# Patient Record
Sex: Female | Born: 1940 | Race: White | Hispanic: No | Marital: Married | Smoking: Never smoker
Health system: Southern US, Community
[De-identification: ages and names within clinical notes are randomized; demographics above are authoritative.]

## PROBLEM LIST (undated history)

## (undated) DIAGNOSIS — T4145XA Adverse effect of unspecified anesthetic, initial encounter: Secondary | ICD-10-CM

## (undated) DIAGNOSIS — E119 Type 2 diabetes mellitus without complications: Secondary | ICD-10-CM

## (undated) DIAGNOSIS — M5126 Other intervertebral disc displacement, lumbar region: Secondary | ICD-10-CM

## (undated) DIAGNOSIS — G4730 Sleep apnea, unspecified: Secondary | ICD-10-CM

## (undated) DIAGNOSIS — Z9289 Personal history of other medical treatment: Secondary | ICD-10-CM

## (undated) DIAGNOSIS — C50911 Malignant neoplasm of unspecified site of right female breast: Secondary | ICD-10-CM

## (undated) DIAGNOSIS — E7800 Pure hypercholesterolemia, unspecified: Secondary | ICD-10-CM

## (undated) DIAGNOSIS — I4892 Unspecified atrial flutter: Secondary | ICD-10-CM

## (undated) DIAGNOSIS — R5381 Other malaise: Secondary | ICD-10-CM

## (undated) DIAGNOSIS — R0600 Dyspnea, unspecified: Secondary | ICD-10-CM

## (undated) DIAGNOSIS — R0689 Other abnormalities of breathing: Secondary | ICD-10-CM

## (undated) DIAGNOSIS — I5043 Acute on chronic combined systolic (congestive) and diastolic (congestive) heart failure: Secondary | ICD-10-CM

## (undated) DIAGNOSIS — I428 Other cardiomyopathies: Secondary | ICD-10-CM

## (undated) DIAGNOSIS — M5136 Other intervertebral disc degeneration, lumbar region: Secondary | ICD-10-CM

## (undated) DIAGNOSIS — R5383 Other fatigue: Secondary | ICD-10-CM

## (undated) DIAGNOSIS — IMO0001 Reserved for inherently not codable concepts without codable children: Secondary | ICD-10-CM

## (undated) DIAGNOSIS — Z85828 Personal history of other malignant neoplasm of skin: Secondary | ICD-10-CM

## (undated) DIAGNOSIS — J45909 Unspecified asthma, uncomplicated: Secondary | ICD-10-CM

## (undated) DIAGNOSIS — M7989 Other specified soft tissue disorders: Secondary | ICD-10-CM

## (undated) DIAGNOSIS — I4819 Other persistent atrial fibrillation: Secondary | ICD-10-CM

## (undated) DIAGNOSIS — Z9889 Other specified postprocedural states: Secondary | ICD-10-CM

## (undated) DIAGNOSIS — M1990 Unspecified osteoarthritis, unspecified site: Secondary | ICD-10-CM

## (undated) DIAGNOSIS — R351 Nocturia: Secondary | ICD-10-CM

## (undated) DIAGNOSIS — J189 Pneumonia, unspecified organism: Secondary | ICD-10-CM

## (undated) DIAGNOSIS — I517 Cardiomegaly: Secondary | ICD-10-CM

## (undated) DIAGNOSIS — M51369 Other intervertebral disc degeneration, lumbar region without mention of lumbar back pain or lower extremity pain: Secondary | ICD-10-CM

## (undated) DIAGNOSIS — C4490 Unspecified malignant neoplasm of skin, unspecified: Secondary | ICD-10-CM

## (undated) DIAGNOSIS — I10 Essential (primary) hypertension: Secondary | ICD-10-CM

## (undated) DIAGNOSIS — N189 Chronic kidney disease, unspecified: Secondary | ICD-10-CM

## (undated) DIAGNOSIS — E039 Hypothyroidism, unspecified: Secondary | ICD-10-CM

## (undated) DIAGNOSIS — T8859XA Other complications of anesthesia, initial encounter: Secondary | ICD-10-CM

## (undated) DIAGNOSIS — C8590 Non-Hodgkin lymphoma, unspecified, unspecified site: Secondary | ICD-10-CM

## (undated) DIAGNOSIS — I480 Paroxysmal atrial fibrillation: Secondary | ICD-10-CM

## (undated) DIAGNOSIS — I440 Atrioventricular block, first degree: Secondary | ICD-10-CM

## (undated) HISTORY — DX: Other persistent atrial fibrillation: I48.19

## (undated) HISTORY — DX: Pure hypercholesterolemia, unspecified: E78.00

## (undated) HISTORY — DX: Sleep apnea, unspecified: G47.30

## (undated) HISTORY — DX: Paroxysmal atrial fibrillation: I48.0

## (undated) HISTORY — DX: Hypothyroidism, unspecified: E03.9

## (undated) HISTORY — DX: Cardiomegaly: I51.7

## (undated) HISTORY — DX: Other specified soft tissue disorders: M79.89

## (undated) HISTORY — DX: Other cardiomyopathies: I42.8

## (undated) HISTORY — PX: TONSILLECTOMY: SUR1361

## (undated) HISTORY — DX: Unspecified atrial flutter: I48.92

## (undated) HISTORY — PX: CATARACT EXTRACTION W/ INTRAOCULAR LENS  IMPLANT, BILATERAL: SHX1307

## (undated) HISTORY — PX: FOOT SURGERY: SHX648

## (undated) HISTORY — DX: Acute on chronic combined systolic (congestive) and diastolic (congestive) heart failure: I50.43

## (undated) HISTORY — DX: Other intervertebral disc degeneration, lumbar region: M51.36

## (undated) HISTORY — PX: BACK SURGERY: SHX140

## (undated) HISTORY — PX: PUBOVAGINAL SLING: SHX1035

## (undated) HISTORY — DX: Unspecified osteoarthritis, unspecified site: M19.90

## (undated) HISTORY — DX: Other malaise: R53.81

## (undated) HISTORY — DX: Atrioventricular block, first degree: I44.0

## (undated) HISTORY — PX: LUMBAR LAMINECTOMY: SHX95

## (undated) HISTORY — PX: ABDOMINAL HYSTERECTOMY: SHX81

## (undated) HISTORY — DX: Other fatigue: R53.83

## (undated) HISTORY — DX: Non-Hodgkin lymphoma, unspecified, unspecified site: C85.90

## (undated) HISTORY — PX: BREAST SURGERY: SHX581

## (undated) HISTORY — DX: Other specified postprocedural states: Z98.890

## (undated) HISTORY — DX: Other abnormalities of breathing: R06.00

## (undated) HISTORY — DX: Other intervertebral disc displacement, lumbar region: M51.26

## (undated) HISTORY — DX: Other abnormalities of breathing: R06.89

## (undated) HISTORY — DX: Other intervertebral disc degeneration, lumbar region without mention of lumbar back pain or lower extremity pain: M51.369

---
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
## 1970-03-01 HISTORY — PX: ABDOMINAL HYSTERECTOMY: SHX81

## 1991-03-02 HISTORY — PX: CHOLECYSTECTOMY: SHX55

## 1998-02-04 ENCOUNTER — Ambulatory Visit (HOSPITAL_COMMUNITY): Admission: RE | Admit: 1998-02-04 | Discharge: 1998-02-04 | Payer: Self-pay | Admitting: Obstetrics and Gynecology

## 1998-02-04 ENCOUNTER — Encounter: Payer: Self-pay | Admitting: Obstetrics and Gynecology

## 1999-02-17 ENCOUNTER — Encounter: Payer: Self-pay | Admitting: Obstetrics and Gynecology

## 1999-02-17 ENCOUNTER — Ambulatory Visit (HOSPITAL_COMMUNITY): Admission: RE | Admit: 1999-02-17 | Discharge: 1999-02-17 | Payer: Self-pay | Admitting: Obstetrics and Gynecology

## 2000-03-15 ENCOUNTER — Ambulatory Visit (HOSPITAL_COMMUNITY): Admission: RE | Admit: 2000-03-15 | Discharge: 2000-03-15 | Payer: Self-pay | Admitting: Obstetrics and Gynecology

## 2000-03-15 ENCOUNTER — Encounter: Payer: Self-pay | Admitting: Obstetrics and Gynecology

## 2000-12-20 ENCOUNTER — Encounter: Payer: Self-pay | Admitting: Obstetrics and Gynecology

## 2000-12-20 ENCOUNTER — Encounter: Admission: RE | Admit: 2000-12-20 | Discharge: 2000-12-20 | Payer: Self-pay | Admitting: Obstetrics and Gynecology

## 2001-02-20 ENCOUNTER — Other Ambulatory Visit: Admission: RE | Admit: 2001-02-20 | Discharge: 2001-02-20 | Payer: Self-pay | Admitting: Obstetrics and Gynecology

## 2001-06-29 ENCOUNTER — Ambulatory Visit (HOSPITAL_COMMUNITY): Admission: RE | Admit: 2001-06-29 | Discharge: 2001-06-29 | Payer: Self-pay | Admitting: Internal Medicine

## 2001-06-29 ENCOUNTER — Encounter: Payer: Self-pay | Admitting: Internal Medicine

## 2001-12-06 ENCOUNTER — Ambulatory Visit (HOSPITAL_COMMUNITY): Admission: RE | Admit: 2001-12-06 | Discharge: 2001-12-06 | Payer: Self-pay | Admitting: *Deleted

## 2002-09-18 ENCOUNTER — Encounter: Payer: Self-pay | Admitting: Internal Medicine

## 2002-09-18 ENCOUNTER — Ambulatory Visit (HOSPITAL_COMMUNITY): Admission: RE | Admit: 2002-09-18 | Discharge: 2002-09-18 | Payer: Self-pay | Admitting: Internal Medicine

## 2003-11-06 ENCOUNTER — Ambulatory Visit (HOSPITAL_COMMUNITY): Admission: RE | Admit: 2003-11-06 | Discharge: 2003-11-06 | Payer: Self-pay | Admitting: Internal Medicine

## 2004-07-07 ENCOUNTER — Other Ambulatory Visit: Admission: RE | Admit: 2004-07-07 | Discharge: 2004-07-07 | Payer: Self-pay | Admitting: Obstetrics and Gynecology

## 2004-11-30 ENCOUNTER — Ambulatory Visit (HOSPITAL_COMMUNITY): Admission: RE | Admit: 2004-11-30 | Discharge: 2004-11-30 | Payer: Self-pay | Admitting: Internal Medicine

## 2005-12-01 ENCOUNTER — Ambulatory Visit (HOSPITAL_COMMUNITY): Admission: RE | Admit: 2005-12-01 | Discharge: 2005-12-01 | Payer: Self-pay | Admitting: Obstetrics and Gynecology

## 2006-07-18 ENCOUNTER — Encounter: Admission: RE | Admit: 2006-07-18 | Discharge: 2006-07-18 | Payer: Self-pay | Admitting: *Deleted

## 2006-08-24 ENCOUNTER — Other Ambulatory Visit: Admission: RE | Admit: 2006-08-24 | Discharge: 2006-08-24 | Payer: Self-pay | Admitting: Obstetrics and Gynecology

## 2006-12-21 ENCOUNTER — Ambulatory Visit (HOSPITAL_COMMUNITY): Admission: RE | Admit: 2006-12-21 | Discharge: 2006-12-21 | Payer: Self-pay | Admitting: Obstetrics and Gynecology

## 2007-02-08 ENCOUNTER — Encounter (INDEPENDENT_AMBULATORY_CARE_PROVIDER_SITE_OTHER): Payer: Self-pay | Admitting: *Deleted

## 2007-02-08 ENCOUNTER — Ambulatory Visit (HOSPITAL_COMMUNITY): Admission: RE | Admit: 2007-02-08 | Discharge: 2007-02-08 | Payer: Self-pay | Admitting: *Deleted

## 2007-11-28 ENCOUNTER — Ambulatory Visit: Payer: Self-pay | Admitting: Obstetrics and Gynecology

## 2007-12-25 ENCOUNTER — Ambulatory Visit (HOSPITAL_COMMUNITY): Admission: RE | Admit: 2007-12-25 | Discharge: 2007-12-25 | Payer: Self-pay | Admitting: Internal Medicine

## 2008-03-04 ENCOUNTER — Encounter: Admission: RE | Admit: 2008-03-04 | Discharge: 2008-06-02 | Payer: Self-pay | Admitting: Rheumatology

## 2008-10-10 ENCOUNTER — Encounter: Payer: Self-pay | Admitting: Cardiology

## 2008-10-10 ENCOUNTER — Observation Stay (HOSPITAL_COMMUNITY): Admission: EM | Admit: 2008-10-10 | Discharge: 2008-10-11 | Payer: Self-pay | Admitting: Emergency Medicine

## 2008-10-10 ENCOUNTER — Ambulatory Visit: Payer: Self-pay | Admitting: Cardiology

## 2008-10-10 HISTORY — PX: CARDIAC CATHETERIZATION: SHX172

## 2008-12-03 ENCOUNTER — Other Ambulatory Visit: Admission: RE | Admit: 2008-12-03 | Discharge: 2008-12-03 | Payer: Self-pay | Admitting: Obstetrics and Gynecology

## 2008-12-03 ENCOUNTER — Encounter: Payer: Self-pay | Admitting: Obstetrics and Gynecology

## 2008-12-03 ENCOUNTER — Ambulatory Visit: Payer: Self-pay | Admitting: Obstetrics and Gynecology

## 2008-12-16 ENCOUNTER — Ambulatory Visit: Payer: Self-pay | Admitting: Obstetrics and Gynecology

## 2009-01-02 ENCOUNTER — Ambulatory Visit (HOSPITAL_COMMUNITY): Admission: RE | Admit: 2009-01-02 | Discharge: 2009-01-02 | Payer: Self-pay | Admitting: Obstetrics and Gynecology

## 2009-02-17 ENCOUNTER — Encounter (INDEPENDENT_AMBULATORY_CARE_PROVIDER_SITE_OTHER): Payer: Self-pay | Admitting: *Deleted

## 2009-02-26 ENCOUNTER — Encounter (INDEPENDENT_AMBULATORY_CARE_PROVIDER_SITE_OTHER): Payer: Self-pay | Admitting: *Deleted

## 2009-03-03 ENCOUNTER — Ambulatory Visit: Payer: Self-pay | Admitting: Internal Medicine

## 2009-03-13 ENCOUNTER — Ambulatory Visit: Payer: Self-pay | Admitting: Internal Medicine

## 2009-03-18 ENCOUNTER — Encounter: Payer: Self-pay | Admitting: Internal Medicine

## 2009-10-08 ENCOUNTER — Ambulatory Visit: Payer: Self-pay | Admitting: Cardiology

## 2009-10-20 ENCOUNTER — Ambulatory Visit (HOSPITAL_COMMUNITY): Admission: RE | Admit: 2009-10-20 | Discharge: 2009-10-20 | Payer: Self-pay | Admitting: Cardiology

## 2009-10-20 ENCOUNTER — Ambulatory Visit: Payer: Self-pay | Admitting: Cardiology

## 2009-12-05 ENCOUNTER — Ambulatory Visit: Payer: Self-pay | Admitting: Obstetrics and Gynecology

## 2009-12-17 ENCOUNTER — Ambulatory Visit: Payer: Self-pay | Admitting: Obstetrics and Gynecology

## 2010-01-05 ENCOUNTER — Encounter: Admission: RE | Admit: 2010-01-05 | Discharge: 2010-01-05 | Payer: Self-pay | Admitting: Obstetrics and Gynecology

## 2010-03-01 HISTORY — PX: SKIN TAG REMOVAL: SHX780

## 2010-03-21 ENCOUNTER — Encounter: Payer: Self-pay | Admitting: Internal Medicine

## 2010-03-31 NOTE — Procedures (Signed)
Summary: Colonoscopy  Patient: [name] Note: All result statuses are Final unless otherwise noted.  Tests: (1) Colonoscopy (COL)   COL Colonoscopy           DONE     [facility]     [address]     [place], [place]  [address]           COLONOSCOPY PROCEDURE REPORT           PATIENT:  Patty [name], Patty [name]  MR#:  [id number]     BIRTHDATE:  1940-09-19, 68 yrs. old  GENDER:  female           ENDOSCOPIST:  Iva Boop, MD, [facility]     Referred by:  Soyla Murphy. Renne Crigler, M.D.           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  Digital rectal exam was performed and     revealed decreased sphincter tone.   The LB CF-H180AL [id number]     endoscope was introduced through the anus and advanced to the     cecum, which was identified by both the appendix and ileocecal     valve. This required movement of the patient to supine position     and then left larteral again, as well as upper abdominal pressure.     The quality of the prep was adequate (after irrigation and     suctioning), using MiraLax.  The instrument was then slowly     withdrawn as the colon was fully examined. Insertion: 12:00     minutes, Withdrawal: 18:50 minutes     <<PROCEDUREIMAGES>>           FINDINGS:  A diminutive polyp was found in the mid transverse     colon. It was 3 mm in size. The polyp was removed using cold  biopsy forceps.  Moderate diverticulosis was found in the left     colon.  This was otherwise a normal examination of the colon.     Retroflexed views in the rectum revealed no abnormalities.    The     scope was then withdrawn from the patient and the procedure     completed.           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 3 vs. 5     years likely (considering health atstus in futire also)           Iva Boop, MD, [facility]           CC:  Romero Liner, MD     The Patient           n.     eSIGNED:   Iva Boop at 03/13/2009 10:06 AM           Patty [name], [id number]  Note: An exclamation mark (!) indicates a result that was not dispersed into the flowsheet. Document Creation Date: 03/13/2009 10:06 AM _______________________________________________________________________  (1) Order result status: Final Collection or observation date-time: 03/13/2009 09:52 Requested date-time:  Receipt date-time:  Reported date-time:  Referring Physician:   Ordering Physician: Stan Head [id number]) Specimen Source:  Source: Launa Grill Order Number: [id number] Lab site:   Appended Document: Colonoscopy     Procedures Next Due Date:    Colonoscopy: 03/2014

## 2010-03-31 NOTE — Miscellaneous (Signed)
Summary: previsit [facility]/rhm  Clinical Lists Changes  Medications: Added new medication of MOVIPREP 100 GM  SOLR (PEG-KCL-NACL-NASULF-NA ASC-C) As per prep instructions. - Signed Rx of MOVIPREP 100 GM  SOLR (PEG-KCL-NACL-NASULF-NA ASC-C) As per prep instructions.;  #1 x 0;  Signed;  Entered by: Sherren Kerns RN;  Authorized by: Iva Boop MD, FACG;  Method used: Print then Give to Patient Allergies: Added new allergy or adverse reaction of PENICILLIN Added new allergy or adverse reaction of DEMEROL Observations: Added new observation of NKA: F (03/03/2009 10:04)    Prescriptions: MOVIPREP 100 GM  SOLR (PEG-KCL-NACL-NASULF-NA ASC-C) As per prep instructions.  #1 x 0   Entered by:   Sherren Kerns RN   Authorized by:   Iva Boop MD, [facility]   Signed by:   Sherren Kerns RN on 03/03/2009   Method used:   Print then Give to Patient   RxID:   [id number]

## 2010-03-31 NOTE — Letter (Signed)
Summary: Patient Notice- Polyp Results  [facility]  [address] [place], [place] [address]   Phone: [phone]  Fax: [phone]        March 18, 2009 MRN: [id number]    Patty [name] [address] [place], [place]  [address]    Dear Ms. [name],  The polyp removed from your colon was adenomatous. This means that it was pre-cancerous or that  it had the potential to change into cancer over time.  I recommend that you have a repeat colonoscopy in 5 years to determine if you have developed any new polyps over time. If you develop any new rectal bleeding, abdominal pain or significant bowel habit changes, please contact us before then.  Please call us if you are having persistent problems or have questions about your condition that have not been fully answered at this time.  Sincerely,  Iva Boop MD, [facility]  This letter has been electronically signed by your physician.  Appended Document: Patient Notice- Polyp Results Letter mailed 1.21.11

## 2010-05-04 ENCOUNTER — Emergency Department (HOSPITAL_COMMUNITY): Payer: Medicare Other

## 2010-05-04 ENCOUNTER — Inpatient Hospital Stay (HOSPITAL_COMMUNITY)
Admission: EM | Admit: 2010-05-04 | Discharge: 2010-05-12 | DRG: 309 | Disposition: A | Discharge status: Home / Self Care | Payer: Medicare Other | Attending: Cardiology | Admitting: Cardiology

## 2010-05-04 DIAGNOSIS — E7800 Pure hypercholesterolemia, unspecified: Secondary | ICD-10-CM | POA: Diagnosis present

## 2010-05-04 DIAGNOSIS — I440 Atrioventricular block, first degree: Secondary | ICD-10-CM | POA: Diagnosis present

## 2010-05-04 DIAGNOSIS — I2510 Atherosclerotic heart disease of native coronary artery without angina pectoris: Secondary | ICD-10-CM | POA: Diagnosis present

## 2010-05-04 DIAGNOSIS — Z882 Allergy status to sulfonamides status: Secondary | ICD-10-CM

## 2010-05-04 DIAGNOSIS — R7309 Other abnormal glucose: Secondary | ICD-10-CM | POA: Diagnosis present

## 2010-05-04 DIAGNOSIS — Z79899 Other long term (current) drug therapy: Secondary | ICD-10-CM

## 2010-05-04 DIAGNOSIS — N390 Urinary tract infection, site not specified: Secondary | ICD-10-CM | POA: Diagnosis present

## 2010-05-04 DIAGNOSIS — R079 Chest pain, unspecified: Secondary | ICD-10-CM

## 2010-05-04 DIAGNOSIS — J45909 Unspecified asthma, uncomplicated: Secondary | ICD-10-CM | POA: Diagnosis present

## 2010-05-04 DIAGNOSIS — Z7901 Long term (current) use of anticoagulants: Secondary | ICD-10-CM

## 2010-05-04 DIAGNOSIS — Z880 Allergy status to penicillin: Secondary | ICD-10-CM

## 2010-05-04 DIAGNOSIS — B961 Klebsiella pneumoniae [K. pneumoniae] as the cause of diseases classified elsewhere: Secondary | ICD-10-CM | POA: Diagnosis present

## 2010-05-04 DIAGNOSIS — I4891 Unspecified atrial fibrillation: Secondary | ICD-10-CM | POA: Diagnosis present

## 2010-05-04 DIAGNOSIS — E039 Hypothyroidism, unspecified: Secondary | ICD-10-CM | POA: Diagnosis present

## 2010-05-04 DIAGNOSIS — I4892 Unspecified atrial flutter: Principal | ICD-10-CM | POA: Diagnosis present

## 2010-05-04 DIAGNOSIS — E876 Hypokalemia: Secondary | ICD-10-CM | POA: Diagnosis present

## 2010-05-04 DIAGNOSIS — I959 Hypotension, unspecified: Secondary | ICD-10-CM | POA: Diagnosis present

## 2010-05-04 DIAGNOSIS — E785 Hyperlipidemia, unspecified: Secondary | ICD-10-CM | POA: Diagnosis present

## 2010-05-04 DIAGNOSIS — I10 Essential (primary) hypertension: Secondary | ICD-10-CM | POA: Diagnosis present

## 2010-05-04 DIAGNOSIS — I4510 Unspecified right bundle-branch block: Secondary | ICD-10-CM | POA: Diagnosis present

## 2010-05-04 DIAGNOSIS — Z7982 Long term (current) use of aspirin: Secondary | ICD-10-CM

## 2010-05-04 DIAGNOSIS — Z91013 Allergy to seafood: Secondary | ICD-10-CM

## 2010-05-04 LAB — COMPREHENSIVE METABOLIC PANEL
CO2: 25 mEq/L (ref 19–32)
Calcium: 9.6 mg/dL (ref 8.4–10.5)
Creatinine, Ser: 0.75 mg/dL (ref 0.4–1.2)
GFR calc Af Amer: >60 mL/min (ref >60)
GFR calc non Af Amer: >60 mL/min (ref >60)
Glucose, Bld: 120 mg/dL — ABNORMAL HIGH (ref 70–99)
Total Protein: 6.8 g/dL (ref 6.0–8.3)

## 2010-05-04 LAB — DIFFERENTIAL
Basophils Absolute: 0 10*3/uL (ref 0.0–0.1)
Basophils Relative: 1 % (ref 0–1)
Eosinophils Absolute: 0.3 10*3/uL (ref 0.0–0.7)
Eosinophils Relative: 3 % (ref 0–5)
Neutrophils Relative %: 56 % (ref 43–77)

## 2010-05-04 LAB — POCT CARDIAC MARKERS
CKMB, poc: <1 ng/mL — ABNORMAL LOW (ref 1.0–8.0)
CKMB, poc: 1.4 ng/mL (ref 1.0–8.0)
Troponin i, poc: <0.05 ng/mL (ref 0.00–0.09)
Troponin i, poc: <0.05 ng/mL (ref 0.00–0.09)

## 2010-05-04 LAB — CBC
HCT: 37.1 % (ref 36.0–46.0)
Hemoglobin: 12.2 g/dL (ref 12.0–15.0)
MCH: 28.5 pg (ref 26.0–34.0)
MCHC: 32.9 g/dL (ref 30.0–36.0)
Platelets: 228 10*3/uL (ref 150–400)
RDW: 13.8 % (ref 11.5–15.5)
RDW: 13.9 % (ref 11.5–15.5)
WBC: 8.1 10*3/uL (ref 4.0–10.5)

## 2010-05-04 LAB — PROTIME-INR
INR: 1 (ref 0.00–1.49)
Prothrombin Time: 13.4 seconds (ref 11.6–15.2)

## 2010-05-04 LAB — HEMOGLOBIN A1C
Hgb A1c MFr Bld: 6.3 % — ABNORMAL HIGH (ref <5.7)
Mean Plasma Glucose: 134 mg/dL — ABNORMAL HIGH (ref <117)

## 2010-05-04 LAB — GLUCOSE, CAPILLARY: Glucose-Capillary: 145 mg/dL — ABNORMAL HIGH (ref 70–99)

## 2010-05-04 LAB — CK TOTAL AND CKMB (NOT AT ARMC): Relative Index: INVALID (ref 0.0–2.5)

## 2010-05-05 DIAGNOSIS — I4891 Unspecified atrial fibrillation: Secondary | ICD-10-CM

## 2010-05-05 LAB — CARDIAC PANEL(CRET KIN+CKTOT+MB+TROPI)
CK, MB: 2.4 ng/mL (ref 0.3–4.0)
Total CK: 66 U/L (ref 7–177)
Troponin I: <0.01 ng/mL (ref 0.00–0.06)
Troponin I: 0.01 ng/mL (ref 0.00–0.06)

## 2010-05-05 LAB — LIPID PANEL
Cholesterol: 128 mg/dL (ref 0–200)
LDL Cholesterol: 51 mg/dL (ref 0–99)
Triglycerides: 163 mg/dL — ABNORMAL HIGH (ref <150)

## 2010-05-05 LAB — GLUCOSE, CAPILLARY
Glucose-Capillary: 115 mg/dL — ABNORMAL HIGH (ref 70–99)
Glucose-Capillary: 103 mg/dL — ABNORMAL HIGH (ref 70–99)
Glucose-Capillary: 121 mg/dL — ABNORMAL HIGH (ref 70–99)

## 2010-05-06 ENCOUNTER — Inpatient Hospital Stay (HOSPITAL_COMMUNITY): Payer: Medicare Other

## 2010-05-06 DIAGNOSIS — R072 Precordial pain: Secondary | ICD-10-CM

## 2010-05-06 DIAGNOSIS — I517 Cardiomegaly: Secondary | ICD-10-CM

## 2010-05-06 LAB — CBC
HCT: 35.7 % — ABNORMAL LOW (ref 36.0–46.0)
Hemoglobin: 11.7 g/dL — ABNORMAL LOW (ref 12.0–15.0)
MCH: 28.5 pg (ref 26.0–34.0)
MCV: 86.9 fL (ref 78.0–100.0)
Platelets: 223 10*3/uL (ref 150–400)
RBC: 4.11 MIL/uL (ref 3.87–5.11)
WBC: 8 10*3/uL (ref 4.0–10.5)

## 2010-05-06 LAB — DIFFERENTIAL
Lymphocytes Relative: 31 % (ref 12–46)
Lymphs Abs: 2.5 10*3/uL (ref 0.7–4.0)
Monocytes Relative: 8 % (ref 3–12)
Neutrophils Relative %: 58 % (ref 43–77)

## 2010-05-06 LAB — GLUCOSE, CAPILLARY
Glucose-Capillary: 122 mg/dL — ABNORMAL HIGH (ref 70–99)
Glucose-Capillary: 122 mg/dL — ABNORMAL HIGH (ref 70–99)
Glucose-Capillary: 171 mg/dL — ABNORMAL HIGH (ref 70–99)

## 2010-05-06 MED ORDER — TECHNETIUM TC 99M TETROFOSMIN IV KIT
30.0000 | PACK | Freq: Once | INTRAVENOUS | Status: AC | PRN
  Administered 2010-05-06: 11:00:00 30 via INTRAVENOUS

## 2010-05-06 MED ORDER — TECHNETIUM TC 99M TETROFOSMIN IV KIT
10.0000 | PACK | Freq: Once | INTRAVENOUS | Status: AC | PRN
  Administered 2010-05-06: 09:00:00 10 via INTRAVENOUS

## 2010-05-07 ENCOUNTER — Other Ambulatory Visit (HOSPITAL_COMMUNITY): Payer: No Typology Code available for payment source

## 2010-05-07 LAB — PROTIME-INR: Prothrombin Time: 14.1 seconds (ref 11.6–15.2)

## 2010-05-07 LAB — GLUCOSE, CAPILLARY: Glucose-Capillary: 134 mg/dL — ABNORMAL HIGH (ref 70–99)

## 2010-05-08 LAB — CBC
MCH: 28.9 pg (ref 26.0–34.0)
MCHC: 33.8 g/dL (ref 30.0–36.0)
Platelets: 217 10*3/uL (ref 150–400)
RBC: 3.95 MIL/uL (ref 3.87–5.11)

## 2010-05-08 LAB — GLUCOSE, CAPILLARY: Glucose-Capillary: 149 mg/dL — ABNORMAL HIGH (ref 70–99)

## 2010-05-08 LAB — URINALYSIS, MICROSCOPIC ONLY
Ketones, ur: NEGATIVE mg/dL
Nitrite: NEGATIVE
Protein, ur: NEGATIVE mg/dL
pH: 6.5 (ref 5.0–8.0)

## 2010-05-08 LAB — BASIC METABOLIC PANEL
BUN: 14 mg/dL (ref 6–23)
GFR calc Af Amer: >60 mL/min (ref >60)
GFR calc non Af Amer: >60 mL/min (ref >60)
Potassium: 2.8 mEq/L — ABNORMAL LOW (ref 3.5–5.1)

## 2010-05-08 LAB — PROTIME-INR
INR: 1.28 (ref 0.00–1.49)
Prothrombin Time: 16.2 seconds — ABNORMAL HIGH (ref 11.6–15.2)

## 2010-05-09 ENCOUNTER — Inpatient Hospital Stay (HOSPITAL_COMMUNITY): Payer: Medicare Other

## 2010-05-09 DIAGNOSIS — I4892 Unspecified atrial flutter: Secondary | ICD-10-CM

## 2010-05-09 LAB — INFLUENZA PANEL BY PCR (TYPE A & B): Influenza A By PCR: NEGATIVE

## 2010-05-09 LAB — BASIC METABOLIC PANEL
BUN: 16 mg/dL (ref 6–23)
Calcium: 9.1 mg/dL (ref 8.4–10.5)
Creatinine, Ser: 0.94 mg/dL (ref 0.4–1.2)
GFR calc non Af Amer: 59 mL/min — ABNORMAL LOW (ref >60)

## 2010-05-09 LAB — PROTIME-INR
INR: 1.66 — ABNORMAL HIGH (ref 0.00–1.49)
Prothrombin Time: 19.8 seconds — ABNORMAL HIGH (ref 11.6–15.2)

## 2010-05-09 LAB — GLUCOSE, CAPILLARY
Glucose-Capillary: 123 mg/dL — ABNORMAL HIGH (ref 70–99)
Glucose-Capillary: 122 mg/dL — ABNORMAL HIGH (ref 70–99)

## 2010-05-09 LAB — URINALYSIS, DIPSTICK ONLY
Glucose, UA: NEGATIVE mg/dL
Ketones, ur: NEGATIVE mg/dL
Protein, ur: NEGATIVE mg/dL

## 2010-05-10 DIAGNOSIS — R509 Fever, unspecified: Secondary | ICD-10-CM

## 2010-05-10 LAB — URINE CULTURE
Colony Count: >=100000
Culture  Setup Time: 201203091009
Special Requests: NEGATIVE

## 2010-05-10 LAB — GLUCOSE, CAPILLARY
Glucose-Capillary: 121 mg/dL — ABNORMAL HIGH (ref 70–99)
Glucose-Capillary: 117 mg/dL — ABNORMAL HIGH (ref 70–99)
Glucose-Capillary: 129 mg/dL — ABNORMAL HIGH (ref 70–99)

## 2010-05-10 LAB — CBC
HCT: 29.8 % — ABNORMAL LOW (ref 36.0–46.0)
MCHC: 33.2 g/dL (ref 30.0–36.0)
MCV: 86.4 fL (ref 78.0–100.0)
RDW: 14.1 % (ref 11.5–15.5)

## 2010-05-10 LAB — COMPREHENSIVE METABOLIC PANEL
BUN: 24 mg/dL — ABNORMAL HIGH (ref 6–23)
CO2: 26 mEq/L (ref 19–32)
Chloride: 98 mEq/L (ref 96–112)
Creatinine, Ser: 1.19 mg/dL (ref 0.4–1.2)
GFR calc non Af Amer: 45 mL/min — ABNORMAL LOW (ref >60)
Glucose, Bld: 142 mg/dL — ABNORMAL HIGH (ref 70–99)
Total Bilirubin: 1 mg/dL (ref 0.3–1.2)

## 2010-05-10 LAB — PROTIME-INR: INR: 2.45 — ABNORMAL HIGH (ref 0.00–1.49)

## 2010-05-11 DIAGNOSIS — I4891 Unspecified atrial fibrillation: Secondary | ICD-10-CM

## 2010-05-11 LAB — GLUCOSE, CAPILLARY
Glucose-Capillary: 116 mg/dL — ABNORMAL HIGH (ref 70–99)
Glucose-Capillary: 127 mg/dL — ABNORMAL HIGH (ref 70–99)

## 2010-05-11 LAB — BASIC METABOLIC PANEL
BUN: 27 mg/dL — ABNORMAL HIGH (ref 6–23)
CO2: 26 mEq/L (ref 19–32)
Calcium: 8.8 mg/dL (ref 8.4–10.5)
Creatinine, Ser: 1.03 mg/dL (ref 0.4–1.2)
GFR calc Af Amer: >60 mL/min (ref >60)

## 2010-05-11 LAB — CBC
MCHC: 33.3 g/dL (ref 30.0–36.0)
MCV: 84.9 fL (ref 78.0–100.0)
Platelets: 223 10*3/uL (ref 150–400)
RDW: 13.8 % (ref 11.5–15.5)
WBC: 8.5 10*3/uL (ref 4.0–10.5)

## 2010-05-11 LAB — URINE CULTURE: Colony Count: >=100000

## 2010-05-12 LAB — COMPREHENSIVE METABOLIC PANEL
AST: 45 U/L — ABNORMAL HIGH (ref 0–37)
Albumin: 2.9 g/dL — ABNORMAL LOW (ref 3.5–5.2)
Calcium: 8.7 mg/dL (ref 8.4–10.5)
Creatinine, Ser: 0.71 mg/dL (ref 0.4–1.2)
GFR calc Af Amer: >60 mL/min (ref >60)

## 2010-05-12 LAB — PROTIME-INR
INR: 2.11 — ABNORMAL HIGH (ref 0.00–1.49)
Prothrombin Time: 23.8 seconds — ABNORMAL HIGH (ref 11.6–15.2)

## 2010-05-15 ENCOUNTER — Ambulatory Visit (INDEPENDENT_AMBULATORY_CARE_PROVIDER_SITE_OTHER): Payer: Medicare Other | Admitting: Nurse Practitioner

## 2010-05-15 DIAGNOSIS — I4891 Unspecified atrial fibrillation: Secondary | ICD-10-CM

## 2010-05-15 DIAGNOSIS — Z7901 Long term (current) use of anticoagulants: Secondary | ICD-10-CM

## 2010-05-15 LAB — CULTURE, BLOOD (ROUTINE X 2): Culture  Setup Time: 201203102025

## 2010-05-20 ENCOUNTER — Ambulatory Visit (INDEPENDENT_AMBULATORY_CARE_PROVIDER_SITE_OTHER): Payer: Medicare Other | Admitting: *Deleted

## 2010-05-20 DIAGNOSIS — I4891 Unspecified atrial fibrillation: Secondary | ICD-10-CM

## 2010-05-20 DIAGNOSIS — Z7901 Long term (current) use of anticoagulants: Secondary | ICD-10-CM

## 2010-05-20 LAB — POCT INR: INR: 1.5

## 2010-05-25 NOTE — Discharge Summary (Signed)
Patty [name], Patty [name]:  [id number]  MEDICAL RECORD NO.:  [id number]           PATIENT TYPE:  I  LOCATION:  2032                         FACILITY:  [facility]  PHYSICIAN:  Cassell Clement, M.D. DATE OF BIRTH:  January 16, 1941  DATE OF ADMISSION:  05/04/2010 DATE OF DISCHARGE:  05/12/2010                              DISCHARGE SUMMARY   PRIMARY CARDIOLOGIST:  Colleen Can. Deborah Chalk, MD  PRIMARY CARE PROVIDER:  Soyla Murphy. Renne Crigler, MD  DISCHARGE DIAGNOSES: 1. Paroxysmal atrial flutter/fibrillation, rate controlled on     diltiazem and digoxin.  Chronic anticoagulation initiated this     admission. 2. Increased LFTs felt to be secondary to Multaq use, this has been     discontinued on this admission. 3. Klebsiella urinary tract infection, on Ceftin. 4. Borderline hypotension. 5. Hypothyroidism. 6. Borderline hypokalemia.  SECONDARY DIAGNOSES: 1. Nonobstructive coronary artery disease per cardiac catheterization     in August 2010.     a.     20-30% lesion in the left anterior descending diagonal and      circumflex.  Ejection fraction 65%.     b.     Myoview on May 07, 2010:  Study showed no sign of scar or      ischemia.  Normal nuclear stress study.  Ejection fraction 70%. 2. Hypertension. 3. Hyperlipidemia. 4. Borderline diabetes. 5. Asthma.  ALLERGIES: 1. DEMEROL causing vomiting. 2. PENICILLIN causing rash. 3. SULFA causing rash. 4. SHELLFISH, specifically OYSTERS.  PROCEDURES/DIAGNOSTICS PERFORMED DURING HOSPITALIZATION: 1. Lexiscan Myoview, May 07, 2010:  No sign of scar or ischemia.     Normal nuclear stress.  Ejection fraction 70%.  (Of note, the     patient initially started as an exercise Myoview but secondary to     inability to achieve a target heart rate, she was changed to     Snyder). 2. Echocardiogram on May 06, 2010:  Left ventricle with moderate LVH.     Estimated ejection fraction 65%.  No wall motion abnormalities.     Left atrium  mildly dilated.  Right ventricle mildly dilated with     mildly reduced systolic function.  Right atrium mildly dilated.     Pulmonary artery peak pressure 40 mmHg. 3. Chest x-ray on May 04, 2010, demonstrating no infiltrates,     congestive heart failure or pneumothorax.  There was noted to be a     calcified aorta. 4. Followup chest x-ray on May 09, 2010, showed no evidence of acute     cardiopulmonary disease.  There was a mild chronic peribronchial     thickening noted.  REASON FOR HOSPITALIZATION:  This is a 70 year old female with the above- stated problem list who presented to the [facility] Emergency Department via EMS with complaints of acute onset of substernal chest pain described that as 10/10.  During this time se also fell in a regular heart rate. By arrival to [facility], the patient's heart rate was noted to be in the 70s, EKG was with a new right bundle-branch block.  Her chest pain had relieved at rest.  The patient was admitted for further evaluation.  HOSPITAL COURSE:  The patient was admitted to telemetry and placed on heparin and nitrates.  She did have an episode of atrial fibrillation on night of admission with rates up to 100.  At this time the patient states she is symptomatic and felt more fatigued than usual.  She had no chest pain.  She was changed to full-dose Lovenox and diltiazem 30 mg q.6 h. was added.  Throughout the evening, the patient was monitored on telemetry and noted to show intermittent atrial flutter/fibrillation. Again, the patient was mildly symptomatic with this patient running in and out of atrial fib/flutter, it was noted that her CHADS-VASc score was 3 and therefore she was initiated on Coumadin.  The patient was not interested in Pradaxa after her sister had been on Pradaxa at one time and had a bad experience.  A 2-D echo was obtained to assess for left atrial and left ventricular ejection fraction.  Left ventricular function was normal,  ejection fraction is 65%.  Left atrium was mildly dilated.  She was continued on diltiazem at this time and exercise Myoview was scheduled for the following morning.  The patient was unable to achieve target heart rate during stress Myoview.  Therefore, the patient was changed to a [employer].  Result showed no evidence of ischemia or infarct.  The patient remained with increased shortness of breath post Myoview.  The patient then returned to normal sinus rhythm with a first-degree AV block per EKG, her diltiazem was consolidated to 120 mg daily.  The patient continued in and out of atrial fibrillation with rates up to 130, therefore Multaq was initiated with the normal ejection fraction.  The patient initially tolerated this well and was voiding discharge after therapeutic INR.  It was then noted that the patient's LFTs were elevated with an AST of 352 and then ALT of 246, this was felt to be secondary to her Multaq and therefore this was discontinued.  An electrophysio consult was obtained, Dr. Johney Frame evaluated the patient and noted that he was unclear how symptomatic the patient was and therefore was initially suggest rate control along with Coumadin.  Digoxin was added to her Cardizem dose.  The patient remained in atrial flutter/fibrillation but was rate controlled without symptoms. With the discontinuation of Multaq, the patient's LFTs were beginning to normalize with an AST of 45 and an ALT of 120.  This will be rechecked in several days as an outpatient to ensure that they continue to improve.  Of note, on March 11 the patient was noted to be mildly febrile.  She also had complaints of urinary incontinence that reminded her of UTI symptoms.  She also showed leukocytosis, therefore urinalysis and urine culture were obtained.  The patient was placed on Macrobid.  Urine cultures did show Klebsiella, the patient states that Macrobid did not work in the past, therefore Ceftin  was initiated.  She will continue antibiotic dose as an outpatient.  The patient's symptoms did improve on antibiotics.  On day of discharge, Dr. Patty Sermons felt that the patient is stable for home with close outpatient followup and then the patient was therapeutic with an INR of 2.11.  She had no further complaints.  DISCHARGE LABORATORY FINDINGS:  INR 2.11, sodium 133, potassium 3.7, BUN 16, creatinine 0.71, AST 45, ALT 120.  DISCHARGE MEDICATIONS: 1. Ceftin 500 mg 1 tablet twice daily. 2. Digoxin 0.125 mg daily. 3. Diltiazem 120 mg daily. 4. Lisinopril 5 mg daily. 5. Potassium chloride 20 mEq daily. 6.  Coumadin 5 mg 1 tablet daily. 7. Advair 250/50 one puff twice daily. 8. Aspirin 81 mg daily. 9. Estrace 0.01% vaginal cream 1 application 2-3 times a week. 10.Lipitor 10 mg daily. 11.Lovaza 1 g 2 capsules twice daily. 12.Singulair 2 mg daily. 13.Synthroid 200 mcg daily. 14.Tylenol Extra Strength 500 mg 1-2 tablets every 4 hours as needed. 15.Vitamin D2 50,000 units 1 capsule monthly. 16.Vitamin D3 2000 units 1 tablet daily. 17.Please stop taking hydrochlorothiazide 25 mg half tablet daily.  FOLLOWUP PLANS AND INSTRUCTIONS: 1. The patient will follow up with Norma Fredrickson, nurse practitioner,     for Dr. Deborah Chalk on March 16 at 9 a.m.Marland Kitchen  At this time blood work     including INR and complete metabolic panel will be obtained. 2. The patient to increase activity slowly, stop anything that causes     chest pain or shortness of breath. 3. The patient to continue a low-sodium heart-healthy and diabetic     diet. 4. The patient to call our office in the interim for any concerns or     questions.  Duration of discharge is greater than 30 minutes.     Leonette Monarch, PA-C   ______________________________ Cassell Clement, M.D.    NB/MEDQ  D:  05/12/2010  T:  05/13/2010  Job:  [id number]  cc:   Colleen Can. Deborah Chalk, M.D. Soyla Murphy. Renne Crigler, M.D. Hillis Range, MD Cassell Clement, M.D.  Electronically Signed by Alen Blew P.A. on 05/18/2010 11:00:34 AM Electronically Signed by Cassell Clement M.D. on 05/25/2010 12:29:37 PM

## 2010-05-25 NOTE — H&P (Signed)
Patty [name], Patty [name]:  [id number]  MEDICAL RECORD NO.:  [id number]           PATIENT TYPE:  I  LOCATION:  6523                         FACILITY:  [facility]  PHYSICIAN:  Patty Clement, M.D. DATE OF BIRTH:  10/18/1940  DATE OF ADMISSION:  05/04/2010 DATE OF DISCHARGE:                             HISTORY & PHYSICAL   PRIMARY CARE PHYSICIAN:  Patty [name]  PRIMARY CARDIOLOGIST:  Patty [name]  CHIEF COMPLAINT:  Chest pain.  HISTORY OF PRESENT ILLNESS:  Patty [name] is a 70 year old female with no critical coronary artery disease.  She was in her usual state of health this a.m. and was getting ready to go to water aerobics when she had onset of substernal chest pain.  She describes as a pressure and it was a 10/10.  She took aspirin 325 mg which is the usual medication she takes for this pain.  She also felt that her heart rate was irregular. When her symptoms did not resolve as usual in 15 minutes or so, she went to the fire department which was very close by.  They evaluated her and recommended transport by EMS to the emergency room.  Her symptoms resolved prior to arrival to the emergency room and she has remained pain free since then.  The chest pain is pain that she has had intermittently for several years.  The pain was worse than usual at a 10/10.  The last episode was on May 02, 2010, which started at rest and lasted less than 10 minutes. This is the first time she has had significant chest pain since last April 2011.  Her symptoms occurred every 2-3 months but not this bad. Today, the symptoms were associated with slight diaphoresis and some shortness of breath.  She had not eaten when the symptoms came on and about an hour or so after the symptoms, she felt some nausea but had no vomiting.  Today, the symptoms radiated through to lower back.  Because she felt that her heart rate was rapid, she took her blood pressure and blood  pressure was approximately 150/78 which is higher than usual for her and her heart rate was in the 80s.  It felt irregular too.  That heart rate is also higher than usual.  Currently, she is resting comfortably.  PAST MEDICAL HISTORY: 1. Status post cardiac catheterization in August 2010, showing 20-30%     lesions in the LAD diagonal and circumflex, all of the vessels were     patent with an EF of 65%. 2. Recurrent chest pain with an exercise-treadmill-echocardiogram in     August 2011, showing fair exercise tolerance, LVH, septal thickness     2.1 cm, posterior wall thickness 1.8 cm, a normal left ventricular     function at rest and with stress. 3. Hypertension. 4. Hyperlipidemia. 5. Borderline diabetes. 6. Asthma. 7. Hypothyroidism.  SURGICAL HISTORY:  She is status post cardiac catheterization as well as cholecystectomy, cataract surgery, colonoscopy with polypectomy, back surgery x2, tonsillectomy, appendectomy, and hysterectomy.  ALLERGIES:  She is allergic or intolerant to DEMEROL, SULFA, and PENICILLIN.  MEDICATIONS: 1.  Estrace vaginal cream 2-3 times weekly. 2. Extra Strength Tylenol p.r.n. 3. Lovaza b.i.d. 4. Vitamin D3 2000 units daily. 5. Vitamin D2 50,000 units monthly. 6. Aspirin 81 mg a day. 7. Lipitor 10 mg daily. 8. Advair b.i.d. 9. HCTZ 1/2 tablet daily. 10.Singulair 10 mg a day. 11.Synthroid 200 mcg daily.  SOCIAL HISTORY:  She lives in [place] with her husband.  She was a retired Patty [name].  She has no history of alcohol, tobacco, or drug abuse and does water aerobics several times a week.  FAMILY HISTORY:  Her mother died at 68 with heart disease and heart failure.  Her father died at 65 with a stroke but had a history of an MI in his 61s and she had 1 brother that died with an MI at age 6.  REVIEW OF SYSTEMS:  She has occasional lower extremity edema, especially when she has to sit for long periods of time and will occasionally take an extra half  HCTZ for this.  The palpitations are described above.  She denies any recent illness, coughing, or wheezing.  She does not feel that she has any significant dyspnea on exertion, and is compliant with her asthma medications.  She has chronic arthralgias and joint pains. She has constipation but only rare reflux and no melena.  Full 14-point review of systems is otherwise negative except as stated in the HPI.  PHYSICAL EXAMINATION:  VITAL SIGNS:  She is afebrile.  Blood pressure 130/64, heart rate 73, respiratory rate 18, O2 saturation 97% on room air. GENERAL:  She is a well-developed elderly white female in no acute distress. HEENT:  Normal. NECK:  There is no lymphadenopathy, thyromegaly, bruit, or JVD noted. CARDIOVASCULAR:  Her heart is regular rate and rhythm with an S1 and S2 and a soft systolic murmur is noted at the left lower sternal border. Distal pulses are intact in all 4 extremities. LUNGS:  Clear to auscultation bilaterally. SKIN:  No rashes or lesions are noted. ABDOMEN:  Soft and nontender with active bowel sounds. EXTREMITIES:  There is no cyanosis, clubbing, or edema noted. MUSCULOSKELETAL:  There is no joint deformity or effusions and no spine or CVA tenderness. NEUROLOGIC:  She is alert and oriented.  Cranial nerves II through XII grossly intact.  Chest x-ray, no acute disease.  EKG, sinus rhythm, rate 71 with a right bundle-branch block which is new although her QRS in V1 in an old EKG is slightly widened  LABORATORY VALUES:  Hemoglobin 12.2, hematocrit 37.1, WBC 7.8, platelets 237.  Sodium 140, potassium 3.7, chloride 105, CO2 25, BUN 20, creatinine 0.75, glucose 120.  Point-of-care markers negative x1.  IMPRESSION:  [name] was seen today by Patty [name], the patient evaluated, and the data reviewed.  She had pain today at rest.  She normally uses a treadmill and does water aerobics without difficulty. Her exam is unremarkable but her EKG shows a new  right bundle-branch block.  The plan will be to admit her and cycle cardiac enzymes.  We will add heparin and nitrates if she has recurrent chest pain or elevated cardiac enzymes.  A stress Myoview will be ordered in a.m.  Cardiac catheterization is indicated if her cardiac enzymes are elevated.     Theodore Demark, PA-C   ______________________________ Patty Clement, M.D.    RB/MEDQ  D:  05/04/2010  T:  05/05/2010  Job:  [id number]  Electronically Signed by Theodore Demark PA-C on 05/18/2010 07:06:24 AM Electronically Signed by Patty Clement M.D. on  05/25/2010 12:29:41 PM

## 2010-05-28 ENCOUNTER — Ambulatory Visit (INDEPENDENT_AMBULATORY_CARE_PROVIDER_SITE_OTHER): Payer: Medicare Other | Admitting: *Deleted

## 2010-05-28 DIAGNOSIS — I480 Paroxysmal atrial fibrillation: Secondary | ICD-10-CM | POA: Insufficient documentation

## 2010-05-28 DIAGNOSIS — Z7901 Long term (current) use of anticoagulants: Secondary | ICD-10-CM

## 2010-05-28 DIAGNOSIS — I4891 Unspecified atrial fibrillation: Secondary | ICD-10-CM

## 2010-05-28 LAB — POCT INR: INR: 2.3

## 2010-06-07 LAB — CBC
HCT: 37.1 % (ref 36.0–46.0)
Hemoglobin: 12.7 g/dL (ref 12.0–15.0)
MCHC: 34 g/dL (ref 30.0–36.0)
MCV: 88.9 fL (ref 78.0–100.0)
Platelets: 255 10*3/uL (ref 150–400)
RDW: 14.4 % (ref 11.5–15.5)
WBC: 8.6 10*3/uL (ref 4.0–10.5)

## 2010-06-07 LAB — APTT: aPTT: 25 seconds (ref 24–37)

## 2010-06-07 LAB — URINE CULTURE: Colony Count: >=100000

## 2010-06-07 LAB — LIPID PANEL
Cholesterol: 155 mg/dL (ref 0–200)
LDL Cholesterol: 77 mg/dL (ref 0–99)
Total CHOL/HDL Ratio: 3.6 RATIO

## 2010-06-07 LAB — URINALYSIS, ROUTINE W REFLEX MICROSCOPIC
Glucose, UA: NEGATIVE mg/dL
Ketones, ur: NEGATIVE mg/dL
pH: 7.5 (ref 5.0–8.0)

## 2010-06-07 LAB — CULTURE, BLOOD (ROUTINE X 2)

## 2010-06-07 LAB — GLUCOSE, CAPILLARY
Glucose-Capillary: 113 mg/dL — ABNORMAL HIGH (ref 70–99)
Glucose-Capillary: 115 mg/dL — ABNORMAL HIGH (ref 70–99)

## 2010-06-07 LAB — BASIC METABOLIC PANEL
CO2: 26 mEq/L (ref 19–32)
Calcium: 9.3 mg/dL (ref 8.4–10.5)
Creatinine, Ser: 0.77 mg/dL (ref 0.4–1.2)
GFR calc Af Amer: >60 mL/min (ref >60)
GFR calc non Af Amer: >60 mL/min (ref >60)
Glucose, Bld: 119 mg/dL — ABNORMAL HIGH (ref 70–99)
Potassium: 3.2 mEq/L — ABNORMAL LOW (ref 3.5–5.1)
Sodium: 137 mEq/L (ref 135–145)

## 2010-06-07 LAB — TROPONIN I: Troponin I: 0.03 ng/mL (ref 0.00–0.06)

## 2010-06-07 LAB — URINE MICROSCOPIC-ADD ON

## 2010-06-07 LAB — CARDIAC PANEL(CRET KIN+CKTOT+MB+TROPI)
CK, MB: 2.4 ng/mL (ref 0.3–4.0)
Relative Index: INVALID (ref 0.0–2.5)
Relative Index: INVALID (ref 0.0–2.5)
Total CK: 72 U/L (ref 7–177)
Troponin I: 0.03 ng/mL (ref 0.00–0.06)

## 2010-06-07 LAB — DIFFERENTIAL
Eosinophils Relative: 3 % (ref 0–5)
Lymphocytes Relative: 31 % (ref 12–46)
Lymphs Abs: 2.6 10*3/uL (ref 0.7–4.0)

## 2010-06-07 LAB — CK TOTAL AND CKMB (NOT AT ARMC)
CK, MB: 2.3 ng/mL (ref 0.3–4.0)
Relative Index: INVALID (ref 0.0–2.5)
Total CK: 84 U/L (ref 7–177)

## 2010-06-07 LAB — TSH: TSH: 1.055 u[IU]/mL (ref 0.350–4.500)

## 2010-06-07 LAB — BRAIN NATRIURETIC PEPTIDE: Pro B Natriuretic peptide (BNP): 43 pg/mL (ref 0.0–100.0)

## 2010-06-07 LAB — POCT CARDIAC MARKERS

## 2010-06-10 ENCOUNTER — Ambulatory Visit (INDEPENDENT_AMBULATORY_CARE_PROVIDER_SITE_OTHER): Payer: Medicare Other | Admitting: *Deleted

## 2010-06-10 DIAGNOSIS — Z7901 Long term (current) use of anticoagulants: Secondary | ICD-10-CM

## 2010-06-10 DIAGNOSIS — I4891 Unspecified atrial fibrillation: Secondary | ICD-10-CM

## 2010-06-10 LAB — POCT INR: INR: 2.6

## 2010-06-15 ENCOUNTER — Other Ambulatory Visit: Payer: Self-pay | Admitting: *Deleted

## 2010-06-15 ENCOUNTER — Encounter: Payer: Self-pay | Admitting: *Deleted

## 2010-06-15 DIAGNOSIS — M1990 Unspecified osteoarthritis, unspecified site: Secondary | ICD-10-CM

## 2010-06-15 DIAGNOSIS — Z79899 Other long term (current) drug therapy: Secondary | ICD-10-CM

## 2010-06-15 DIAGNOSIS — E785 Hyperlipidemia, unspecified: Secondary | ICD-10-CM

## 2010-06-15 DIAGNOSIS — Z7901 Long term (current) use of anticoagulants: Secondary | ICD-10-CM

## 2010-06-15 DIAGNOSIS — E039 Hypothyroidism, unspecified: Secondary | ICD-10-CM | POA: Insufficient documentation

## 2010-06-16 ENCOUNTER — Other Ambulatory Visit (INDEPENDENT_AMBULATORY_CARE_PROVIDER_SITE_OTHER): Payer: Medicare Other | Admitting: *Deleted

## 2010-06-16 ENCOUNTER — Encounter: Payer: Self-pay | Admitting: Cardiology

## 2010-06-16 ENCOUNTER — Ambulatory Visit (INDEPENDENT_AMBULATORY_CARE_PROVIDER_SITE_OTHER): Payer: Medicare Other | Admitting: Cardiology

## 2010-06-16 ENCOUNTER — Encounter (INDEPENDENT_AMBULATORY_CARE_PROVIDER_SITE_OTHER): Payer: Medicare Other | Admitting: *Deleted

## 2010-06-16 VITALS — BP 118/56 | HR 64 | Ht 66.5 in | Wt 166.0 lb

## 2010-06-16 DIAGNOSIS — Z79899 Other long term (current) drug therapy: Secondary | ICD-10-CM

## 2010-06-16 DIAGNOSIS — I4891 Unspecified atrial fibrillation: Secondary | ICD-10-CM

## 2010-06-16 LAB — HEPATIC FUNCTION PANEL
ALT: 33 U/L (ref 0–35)
AST: 22 U/L (ref 0–37)
Bilirubin, Direct: 0.2 mg/dL (ref 0.0–0.3)
Total Bilirubin: 0.7 mg/dL (ref 0.3–1.2)
Total Protein: 6.8 g/dL (ref 6.0–8.3)

## 2010-06-16 LAB — BASIC METABOLIC PANEL
CO2: 27 mEq/L (ref 19–32)
Chloride: 103 mEq/L (ref 96–112)
Potassium: 4.7 mEq/L (ref 3.5–5.1)

## 2010-06-16 NOTE — Progress Notes (Signed)
This encounter was created in error - please disregard.

## 2010-06-16 NOTE — Assessment & Plan Note (Signed)
I think her lightheadedness and dizziness today is more related to losartan. We'll discontinue that. I will have her seen in approximately 4 weeks for followup with Lawson Fiscal. We will arrange for her to see Dr. Sw[place] in followup. Her husband is cared for by Dr. Sw[place]il[place].

## 2010-06-16 NOTE — Progress Notes (Signed)
Subjective:   [name] is seen back today for followup visit. She's been lightheaded and dizzy since being started on losartan. She has not had much in the way of arrhythmia. She has been on triamterene hydrochlorothiazide for long duration and the only real new medicine for her is losartan. She's not had any chest pain but she does feel weak and washed out.  She has a history of arthritis in her back and a history of asthma. She's had hypothyroidism as well as hyperlipemia. She has PAF and is managed on Coumadin with rate control. She had cardiac catheterization in 2010 showing nonobstructive coronary atherosclerosis. She had a negative stress test in 2012. She is mild LVH per echo in 2012.  Current Outpatient Prescriptions  Medication Sig Dispense Refill  . acetaminophen (TYLENOL) 500 MG chewable tablet Chew 1,000 mg by mouth every 6 (six) hours as needed.        Marland Kitchen albuterol (PROVENTIL HFA;VENTOLIN HFA) 108 (90 BASE) MCG/ACT inhaler Inhale 2 puffs into the lungs every 6 (six) hours as needed.        Marland Kitchen aspirin 81 MG tablet Take 81 mg by mouth daily.        Marland Kitchen atorvastatin (LIPITOR) 20 MG tablet Take 20 mg by mouth daily. Take as directed       . Cholecalciferol (VITAMIN D) 1000 UNITS capsule Take 1,000 Units by mouth daily.        Marland Kitchen dicyclomine (BENTYL) 10 MG capsule Take 10 mg by mouth 4 (four) times daily -  before meals and at bedtime.        . digoxin (LANOXIN) 0.125 MG tablet Take 1 tablet by mouth Daily.      Marland Kitchen diltiazem (CARDIZEM CD) 120 MG 24 hr capsule Take 1 tablet by mouth Daily.      Marland Kitchen ESTRACE VAGINAL 0.1 MG/GM vaginal cream Place vaginally as directed.      . fluticasone-salmeterol (ADVAIR HFA) 115-21 MCG/ACT inhaler Inhale 2 puffs into the lungs 2 (two) times daily.        Marland Kitchen losartan (COZAAR) 50 MG tablet Take 1 tablet by mouth Daily.      Marland Kitchen LOVAZA 1 G capsule Take 2 tablets by mouth 2 (two) times daily.      . potassium chloride SA (K-DUR,KLOR-CON) 20 MEQ tablet Take 1 tablet by  mouth Daily.      . promethazine (PHENERGAN) 25 MG tablet Take 1 tablet by mouth as directed.      Marland Kitchen SINGULAIR 10 MG tablet Take 1 tablet by mouth Daily.      Marland Kitchen SYNTHROID 200 MCG tablet Take 1 tablet by mouth Daily.      Marland Kitchen triamterene-hydrochlorothiazide (MAXZIDE-25) 37.5-25 MG per tablet Take 1 tablet by mouth daily.        . Vitamin D, Ergocalciferol, (DRISDOL) 50000 UNITS CAPS Take 1 tablet by mouth Every month.      . warfarin (COUMADIN) 5 MG tablet Take by mouth as directed.      . cefUROXime (CEFTIN) 500 MG tablet Take 1 tablet by mouth Twice daily.      . magnesium gluconate (MAGONATE) 500 MG tablet Take 500 mg by mouth 2 (two) times daily.        . nitrofurantoin, macrocrystal-monohydrate, (MACROBID) 100 MG capsule as directed.        Allergies  Allergen Reactions  . Demerol   . Meperidine Hcl     REACTION: vomiting  . Penicillins     REACTION: itching, rash  .  Sulfa Drugs Cross Reactors     Patient Active Problem List  Diagnoses  . Atrial fibrillation  . Hyperlipemia  . Hypothyroidism  . Asthma  . Arthritis  . Long-term (current) use of anticoagulants    History  Smoking status  . Never Smoker   Smokeless tobacco  . Not on file    History  Alcohol Use No    Family History  Problem Relation Age of Onset  . Stroke Father     at 72  . Heart attack Father     at 35  . Heart disease Mother   . Arthritis Mother   . Breast cancer Mother     c  . Heart failure Mother     Review of Systems:   The patient denies any heat or cold intolerance.  No weight gain or weight loss.  The patient denies headaches or blurry vision.  There is no cough or sputum production.    There is no hematuria or hematochezia.  The patient denies any muscle aches or arthritis.  The patient denies any rash.    There is no history of depression or anxiety.  All other systems were reviewed and are negative.   Physical Exam:   Her weight is 166. Blood pressure is 118/56 sitting, 110/54  standing, heart rate is 66 and regular.The head is normocephalic and atraumatic.  Pupils are equally round and reactive to light.  Sclerae nonicteric.  Conjunctiva is clear.  Oropharynx is unremarkable.  There's adequate oral airway.  Neck is supple there are no masses.  Thyroid is not enlarged.  There is no lymphadenopathy.  Lungs are clear.  Chest is symmetric.  Heart shows a regular rate and rhythm.  S1 and S2 are normal.  There is no murmur click or gallop.  Abdomen is soft normal bowel sounds.  There is no organomegaly.  Genital and rectal deferred.  Extremities are without edema.  Peripheral pulses are adequate.  Neurologically intact.  Full range of motion.  The patient is not depressed.  Skin is warm and dry.  Assessment / Plan:

## 2010-06-17 ENCOUNTER — Ambulatory Visit: Payer: Medicare Other | Admitting: Cardiology

## 2010-06-18 ENCOUNTER — Telehealth: Payer: Self-pay | Admitting: *Deleted

## 2010-06-18 NOTE — Telephone Encounter (Signed)
Message copied by Barnetta Hammersmith on Thu Jun 18, 2010  9:04 AM ------      Message from: Roger Shelter      Created: Wed Jun 17, 2010  1:48 PM       OK

## 2010-06-18 NOTE — Telephone Encounter (Signed)
Pt notified of lab results and to continue same medications.   

## 2010-07-01 ENCOUNTER — Ambulatory Visit (INDEPENDENT_AMBULATORY_CARE_PROVIDER_SITE_OTHER): Payer: Medicare Other | Admitting: *Deleted

## 2010-07-01 DIAGNOSIS — Z7901 Long term (current) use of anticoagulants: Secondary | ICD-10-CM

## 2010-07-01 DIAGNOSIS — I4891 Unspecified atrial fibrillation: Secondary | ICD-10-CM

## 2010-07-14 ENCOUNTER — Encounter: Payer: Self-pay | Admitting: Nurse Practitioner

## 2010-07-14 ENCOUNTER — Ambulatory Visit (INDEPENDENT_AMBULATORY_CARE_PROVIDER_SITE_OTHER): Payer: Medicare Other | Admitting: Nurse Practitioner

## 2010-07-14 DIAGNOSIS — I4891 Unspecified atrial fibrillation: Secondary | ICD-10-CM

## 2010-07-14 DIAGNOSIS — E785 Hyperlipidemia, unspecified: Secondary | ICD-10-CM

## 2010-07-14 DIAGNOSIS — Z7901 Long term (current) use of anticoagulants: Secondary | ICD-10-CM

## 2010-07-14 DIAGNOSIS — I2510 Atherosclerotic heart disease of native coronary artery without angina pectoris: Secondary | ICD-10-CM

## 2010-07-14 NOTE — Patient Instructions (Signed)
Stay on your current medicines.  I will have you see Dr. Swaziland in about 4 months.  Call for any problems

## 2010-07-14 NOTE — Assessment & Plan Note (Signed)
Managed on coumadin. Will continue to monitor accordingly.

## 2010-07-14 NOTE — Discharge Summary (Signed)
NAMEJACOLE, CAPLEY NO.:  [id number]   MEDICAL RECORD NO.:  [id number]          PATIENT TYPE:  INP   LOCATION:  2507                         FACILITY:  [facility]   PHYSICIAN:  Peter M. Swaziland, M.D.  DATE OF BIRTH:  08-31-1940   DATE OF ADMISSION:  10/10/2008  DATE OF DISCHARGE:  10/11/2008                               DISCHARGE SUMMARY   HISTORY OF PRESENT ILLNESS:  [name] is a pleasant 70 year old white  female who presented to the emergency department.  She developed acute  onset of shortness of breath with the sensation of a fast heartbeat.  She had associated chest pressure and near syncope.  She has complained  of some mild dysuria.  She had noted onset of fairly marked fatigue over  the past 2 days.  She was admitted for further evaluation of these  symptoms.  For details of her past medical history, social history,  family history, and physical exam, please see admission history and  physical.   LABORATORY DATA:  Her chest x-ray showed mild cardiomegaly with no  active disease.  White count was 8600, hemoglobin 12.7, hematocrit 37.1,  platelets 255,000.  Sodium 137, potassium 3.2, chloride 103, CO2 24, BUN  14, creatinine 0.74.  Initial glucose was 204.  Repeat glucose was 119,  potassium is 3.5.  Coags were normal.  Serial cardiac enzymes were  negative x4.  Total cholesterol is 135 with an LDL of 77, HDL of 43,  triglycerides of 174.  Calcium and magnesium levels were normal.  TSH  was 1.055.  BNP level was 43.  Urinalysis showed specific gravity 1.008,  pH 7.5, it was noted to be cloudy.  There were 3 to 6 white cells per  high-power field with many bacteria, was nitrite positive, and leukocyte  esterase trace.  Blood and urine cultures were pending at the time of  discharge.  Initial ECG showed normal sinus rhythm with first-degree AV  block.  There was slight ST depression in lateral leads.  Repeat ECG the  following day showed diffuse T-wave  inversion in the anterolateral leads  with voltage criteria for LVH.   HOSPITAL COURSE:  The patient was admitted to telemetry monitoring.  She  was treated with subcutaneous heparin.  She was placed on topical  nitrates.  She was started on Cipro for presumed urinary tract  infection.  As noted, her subsequent cardiac enzymes were negative.  Given her ECG changes and presenting symptoms, it was felt that she  warranted further evaluation with cardiac catheterization.  This was  performed on October 10, 2008.  This demonstrated mild coronary  calcification and mild nonobstructive coronary disease.  Left  ventricular function was normal with ejection fraction of 65%.  Echocardiogram demonstrated evidence of LVH with normal systolic  function.  She had mild mitral insufficiency.  There was no pericardial  effusion.  She did well following these procedures with no  complications.  She remained afebrile.  Her vital signs remained stable.  She had no arrhythmias on monitor.  CBGs were 113 to 118.  She was  ambulatory and  felt to be stable for discharge.  She was maintained on  her usual medications with the addition of Cipro for antibiotic coverage  of urinary tract infection, pending cultures.   DISCHARGE DIAGNOSES:  1. Urinary tract infection.  2. Dyspnea and chest pain, noncardiac.  3. Asthma.  4. Hypothyroidism.  5. Hyperlipidemia, well-controlled,  6. Glucose intolerance.   DISCHARGE MEDICATIONS:  1. Cipro 500 mg twice a day for 3 more days.  2. Synthroid 175 mcg daily.  3. Maxzide 37.5/25 mg daily.  4. Lipitor 10 mg per day.  5. Singulair 10 mg daily.  6. Advair 250/50 mcg 2 puffs twice a day.  7. Lovaza 2 g daily.  8. Aspirin 81 mg daily.  9. Vitamin D 1000 units daily.  10.Bentyl 10 mg p.r.n.   The patient may resume her normal activities with restriction of lifting  or straining for 1 week.  I have recommended she follow up with Dr.  Renne Crigler in 2 weeks.   DISCHARGE  STATUS:  Improved.           ______________________________  Peter M. Swaziland, M.D.     PMJ/MEDQ  D:  10/11/2008  T:  10/11/2008  Job:  [id number]   cc:   Soyla Murphy. Renne Crigler, M.D.

## 2010-07-14 NOTE — Assessment & Plan Note (Signed)
Lipids are followed by Dr. Renne Crigler.

## 2010-07-14 NOTE — Cardiovascular Report (Signed)
[name], [name]:  [id number]   MEDICAL RECORD NO.:  [id number]          PATIENT TYPE:  INP   LOCATION:  2507                         FACILITY:  [facility]   PHYSICIAN:  Peter M. Swaziland, M.D.  DATE OF BIRTH:  Apr 11, 1940   DATE OF PROCEDURE:  10/10/2008  DATE OF DISCHARGE:                            CARDIAC CATHETERIZATION   INDICATIONS FOR PROCEDURE:  A 70 year old female with history of  hypertension, hyperlipidemia, and family history of early coronary  artery disease presents with symptoms of acute shortness of breath  associated with chest pressure.  ECG shows diffuse anterolateral T-wave  inversion.   PROCEDURE:  Left heart catheterization, coronary and left ventricular  angiography.   ACCESS:  Via the right femoral artery using standard Seldinger  technique.   EQUIPMENT:  1. A 6-French 4 cm right and left Judkins catheter.  2. A 6-French pigtail catheter.  3. A 6-French arterial sheath.   MEDICATIONS:  Local anesthesia with 1% Xylocaine, Versed 2 mg IV, and  contrast 100 mL of Omnipaque.   HEMODYNAMIC DATA:  Aortic pressure is 113/60, with a mean of 81 mmHg;  left ventricular pressure is 119 with an EDP of 10 mmHg.   ANGIOGRAPHIC DATA:  Left coronary artery arises and distributes in a  dominant fashion.  The left main coronary artery is calcified without  significant disease.   The left anterior descending artery has 20% narrowing in the takeoff of  the first diagonal branch.  There is another 20% lesion in the mid-to-  distal LAD.  The origin of the first diagonal has 30% narrowing.   The left circumflex coronary artery is a very large dominant vessel.  It  has 20% narrowing proximally.  The remainder of the vessels are without  significant disease.   The right coronary artery is a nondominant vessel and is normal.   The left ventricular angiography was performed in the RAO view.  This  demonstrates normal left ventricular size and  contractility with normal  systolic function.  Ejection fraction is estimated at 65%.  There is no  mitral regurgitation or prolapse.   FINAL INTERPRETATION:  1. Minor nonobstructive atherosclerotic coronary artery disease.  2. Normal left ventricular function.           ______________________________  Peter M. Swaziland, M.D.     PMJ/MEDQ  D:  10/10/2008  T:  10/11/2008  Job:  [id number]   cc:   Soyla Murphy. Renne Crigler, M.D.

## 2010-07-14 NOTE — Assessment & Plan Note (Signed)
She has paroxysmal atrial fib. She seems to be in sinus by physical exam today. She is committed to long term coumadin. She is tolerating it well. Overall, she is stable from our standpoint. I will have her see Dr. Swaziland in about 4 months. We will continue to monitor her coumadin accordingly. Patient is agreeable to this plan and will call if any problems develop in the interim.

## 2010-07-14 NOTE — Assessment & Plan Note (Signed)
I encouraged her to continue with her exercise program and work on her weight. We will continue to manage her medically. She is up to date on her stress testing.

## 2010-07-14 NOTE — H&P (Signed)
Patty [name], Patty [name]              ACCOUNT NO.:  [id number]   MEDICAL RECORD NO.:  [id number]          PATIENT TYPE:  INP   LOCATION:  2610                         FACILITY:  [facility]   PHYSICIAN:  Darryl D. Prime, MD    DATE OF BIRTH:  Jan 18, 1941   DATE OF ADMISSION:  10/10/2008  DATE OF DISCHARGE:                              HISTORY & PHYSICAL   CODE STATUS:  Full Code.   PRIMARY CARE PHYSICIAN:  Dr. Renne Crigler.  She has seen Dr. Deborah Chalk in the  past, she notes about 10 years ago for a fast heart rate.   CHIEF COMPLAINT:  Chest pain   HISTORY OF PRESENT ILLNESS:  Ms.  Patty [name] is a 70 year old female with a  history of diabetes, hyperlipidemia.  She notes a relatively sudden  onset of shortness of breath, fast heartbeat, skipping beats with chest  pressure on tonight.  Very similar to the symptoms a year ago.  At that  time, her Synthroid was decreased which helped symptoms significantly.  This happened when she was walking from one end of the house to the  next.  She had some nausea, but in the emergency room she was given  Zofran and NitroPaste.  She did have a sensation that she was going to  pass out.  In the ED, urinalysis was significant for possible urinary  tract infection.  She was given ciprofloxacin p.o.  She has not had any  symptoms since being in the ED.  She took an aspirin at home.   PAST MEDICAL AND SURGICAL HISTORY:  1. History of diabetes.  2. Hypothyroidism.  3. Asthma.  4. Hyperlipidemia.  5. Tachycardia not otherwise specified.  6. Status post tonsillectomy.  7. Appendectomy.  8. Hysterectomy.  9. Gallbladder surgery.  10.Back surgery x2.  11.Cataract surgeries.   ALLERGIES:  PENICILLIN AND DEMEROL.   MEDICATIONS:  1. Synthroid 175 mcg daily.  2. Lipitor 10 mg daily.  3. Singulair 10 mg daily.  4. Advair discus twice a day.  5. Vitamin D.  6. Levoxyl two tablets daily.  7. Bentyl 10 mg as needed.  8. Aspirin 81 mg daily.  9. Maxzide daily.  Not  sure of the exact dose.   SOCIAL HISTORY:  No history of tobacco, alcohol or drug use ever.   FAMILY HISTORY:  Father had a myocardial infarction at the age of 77 and  died of a stroke.   REVIEW OF SYSTEMS:  A 14-point review of systems negative unless stated  above.   PHYSICAL EXAMINATION:  VITAL SIGNS:  Temperature is 97, blood pressure  151/85, pulse of 102, respirations 16, sats 96% on room air.  GENERAL:  The patient is a female who looks her stated age, sitting  upright in bed in no acute distress.  HEENT:  Normocephalic, atraumatic.  Pupils equal, round and react to  light.  Extraocular being intact.  The oropharynx reveals no posterior  pharyngeal lesions.  NECK:  Supple with no lymphadenopathy or thyromegaly.  No carotid bruits  or venous distention.  LUNGS:  Clear to auscultation bilaterally.  CARDIOVASCULAR:  Regular rate  and rhythm with no murmurs, rubs or  gallops.  Normal S1, S2.  No S3-S4.  ABDOMEN:  Showed suprapubic tenderness.  No CVA tenderness.  Normoactive  bowel sounds.  No hepatosplenomegaly.  EXTREMITIES: Show no clubbing, cyanosis or edema.  NEUROLOGIC:  She is alert and x4.  Cranial nerves II-XII grossly intact.  Strength and sensation grossly intact.  The patient's skin shows no  rashes or ulcers.   LABORATORY DATA:  Cardiac markers were unremarkable at 036 and 054.  Sodium 137, potassium 3.2, chloride 103, bicarb 24, BUN 14, creatinine  of 0.74, glucose 204.  Urinalysis showed many bacteria, WBCs 3-6,  nitrate positive, leukocyte esterase cloudy urine.  White count 8.6,  hemoglobin of 12.7, hematocrit 37.1, platelets 255, segs of 58.  Chest x-  ray shows cardiomegaly with no acute cardiopulmonary disease.  EKG shows  sinus rhythm at first degree AV block.  She has left axis deviation with  possible ST depressions laterally, unsure of prior.  The patient has  poor R wave progression on EKG.   ASSESSMENT/PLAN:  This is a patient with a history of  possible  arrhythmia.  She presents with chest pain, presyncope.  She may have  some elements of hypovolemia.  She has a urinary tract infection.  At  this time, we will treat her urinary tract infection and check for  orthostatics.  Will rule out acute coronary syndrome with cardiac  markers. She does have some EKG changes which may warrant further  evaluation.  She will be placed on aspirin and will check her lipids  with consideration for statin therapy.  Will check a B-type natruretic  peptide and echocardiogram.  Urine cultures and blood cultures will be  ordered.  Cipro for her urinary tract infection.  DVT and GI  prophylaxis.      Darryl D. Prime, MD  Electronically Signed     DDP/MEDQ  D:  10/10/2008  T:  10/10/2008  Job:  [id number]

## 2010-07-14 NOTE — Op Note (Signed)
NAMERANDALL, [name]:  [id number]   MEDICAL RECORD NO.:  [id number]          PATIENT TYPE:  AMB   LOCATION:  ENDO                         FACILITY:  [facility]   PHYSICIAN:  Georgiana Spinner, M.D.    DATE OF BIRTH:  08/10/1940   DATE OF PROCEDURE:  02/08/2007  DATE OF DISCHARGE:                               OPERATIVE REPORT   PROCEDURE:  Colonoscopy.   INDICATIONS:  Colon polyp, right-sided abdominal pain.   ANESTHESIA:  Fentanyl 100 mcg, Versed 10 mg.   PROCEDURE IN DETAIL:  With the patient mildly sedated in the left  lateral decubitus position the Pentax videoscopic colonoscope was  inserted in the rectum and passed under direct vision with pressure  applied.  The patient rolled to her back and subsequently to the right  side and we were able to reach the base of the cecum identified by  ileocecal valve and appendiceal orifice both of which were photographed.  From this point the colonoscope was slowly withdrawn taking  circumferential views of colonic mucosa stopping in the splenic flexure  area where a polyp was seen, photographed and removed using snare  cautery technique with setting of 20/150 blended current and we  subsequently had to use the hot biopsy forceps to remove the remainder  of the polyp and it was retrieved for pathology.  The endoscope was  withdrawn all the way to the rectum stopping in the sigmoid colon to  photograph diverticula that were seen.  The rectum appeared normal and  direct showed hemorrhoids on retroflexed view.  The endoscope was  straightened and withdrawn.  The patient's vital signs, pulse oximeter  remained stable.  The patient tolerated the procedure well without  apparent complication.   FINDINGS:  Polyp at splenic flexure, internal hemorrhoids,  diverticulosis of the sigmoid colon, mild.   PLAN:  Await biopsy report.  The patient will call me for results and  follow up with me as an outpatient.     ______________________________  Georgiana Spinner, M.D.     GMO/MEDQ  D:  02/08/2007  T:  02/08/2007  Job:  [id number]

## 2010-07-14 NOTE — Progress Notes (Signed)
Patty [name] Date of Birth: [birth year]   History of Present Illness: Patty [name] is seen back today for a one month visit. She is seen for Dr. Deborah Chalk. She will be followed by Dr. Swaziland upon his retirement. She was here about 1 month ago. Losartan was stopped because of dizziness. She has been watching her blood pressure at home and it has been good. She feels good overall. She will have some transient dizziness after sitting for about 30 minutes. No chest pain or shortness of breath. She has no palpitations. She is exercising regularly. She is trying to stay active. She is tolerating her medicines. She has had no excessive bruising and no bleeding.  She has seen Dr. Renne Crigler recently and had a good report.   Current Outpatient Prescriptions on File Prior to Visit  Medication Sig Dispense Refill  . acetaminophen (TYLENOL) 500 MG chewable tablet Chew 1,000 mg by mouth every 6 (six) hours as needed.        Marland Kitchen albuterol (PROVENTIL HFA;VENTOLIN HFA) 108 (90 BASE) MCG/ACT inhaler Inhale 2 puffs into the lungs every 6 (six) hours as needed.        Marland Kitchen aspirin 81 MG tablet Take 81 mg by mouth daily.        Marland Kitchen atorvastatin (LIPITOR) 20 MG tablet Take 10 mg by mouth daily. Take as directed      . Cholecalciferol (VITAMIN D) 1000 UNITS capsule Take 2,000 Units by mouth daily.       Marland Kitchen dicyclomine (BENTYL) 10 MG capsule Take 10 mg by mouth as needed.       . digoxin (LANOXIN) 0.125 MG tablet Take 1 tablet by mouth Daily.      Marland Kitchen diltiazem (CARDIZEM CD) 120 MG 24 hr capsule Take 1 tablet by mouth Daily.      Marland Kitchen ESTRACE VAGINAL 0.1 MG/GM vaginal cream Place vaginally as directed.      . fluticasone-salmeterol (ADVAIR HFA) 115-21 MCG/ACT inhaler Inhale 2 puffs into the lungs 2 (two) times daily.        Marland Kitchen LOVAZA 1 G capsule Take 2 tablets by mouth 2 (two) times daily.      . magnesium gluconate (MAGONATE) 500 MG tablet Take 500 mg by mouth daily.       . potassium chloride SA (K-DUR,KLOR-CON) 20 MEQ tablet Take 1  tablet by mouth Daily.      Marland Kitchen SINGULAIR 10 MG tablet Take 1 tablet by mouth Daily.      Marland Kitchen SYNTHROID 200 MCG tablet Take 1 tablet by mouth Daily.      Marland Kitchen triamterene-hydrochlorothiazide (MAXZIDE-25) 37.5-25 MG per tablet Take 1 tablet by mouth daily. 1/2 tablet daily      . Vitamin D, Ergocalciferol, (DRISDOL) 50000 UNITS CAPS Take 1 tablet by mouth Every month.      . warfarin (COUMADIN) 5 MG tablet Take by mouth as directed.      Marland Kitchen DISCONTD: cefUROXime (CEFTIN) 500 MG tablet Take 1 tablet by mouth Twice daily.      Marland Kitchen DISCONTD: losartan (COZAAR) 50 MG tablet Take 1 tablet by mouth Daily.      Marland Kitchen DISCONTD: nitrofurantoin, macrocrystal-monohydrate, (MACROBID) 100 MG capsule as directed.      Marland Kitchen DISCONTD: promethazine (PHENERGAN) 25 MG tablet Take 1 tablet by mouth as directed.        Allergies  Allergen Reactions  . Demerol   . Meperidine Hcl     REACTION: vomiting  . Penicillins     REACTION:  itching, rash  . Sulfa Drugs Cross Reactors     Past Medical History  Diagnosis Date  . Arthritis 06/15/2010  . Asthma 06/15/2010  . Hypothyroidism 06/15/2010  . Hyperlipemia 06/15/2010  . Atrial fibrillation 05/28/2010  . Long-term (current) use of anticoagulants   . Diabetes mellitus   . Tachycardia     Past Surgical History  Procedure Date  . Appendectomy 1953  . Partial hysterectomy 1972  . Cholecystectomy 1993  . Back surgery     x2  . Cataract extraction   . Cardiac catheterization 10/10/2008    History  Smoking status  . Never Smoker   Smokeless tobacco  . Never Used    History  Alcohol Use No    Family History  Problem Relation Age of Onset  . Stroke Father     at 72  . Heart attack Father     at 47  . Heart disease Mother   . Arthritis Mother   . Breast cancer Mother     c  . Heart failure Mother     Review of Systems: The review of systems is as above.  All other systems were reviewed and are negative.  Physical Exam: BP 140/70  Pulse 76  Ht 5\' 6"  (1.676  m)  Wt 163 lb (73.936 kg)  BMI 26.31 kg/m2 Patient is very pleasant and in no acute distress. Her mood is more upbeat today. Skin is warm and dry. Color is normal.  HEENT is unremarkable. Normocephalic/atraumatic. PERRL. Sclera are nonicteric. Neck is supple. No masses. No JVD. Lungs are clear. Cardiac exam shows a regular rate and rhythm. Abdomen is soft. Extremities are without edema. Gait and ROM are intact. No gross neurologic deficits noted.  LABORATORY DATA:   Assessment / Plan:

## 2010-07-17 NOTE — Op Note (Signed)
   Patty [name], Patty [name]:  [id number]   MEDICAL RECORD NO.:  [id number]                   PATIENT TYPE:  AMB   LOCATION:  ENDO                                 FACILITY:  [facility]   PHYSICIAN:  Georgiana Spinner, M.D.                 DATE OF BIRTH:  [birth year]   DATE OF PROCEDURE:  12/06/2001  DATE OF DISCHARGE:                                 OPERATIVE REPORT   PROCEDURE:  Colonoscopy.   INDICATIONS:  Colon polyps.   ANESTHESIA:  Fentanyl 100 mcg, Versed 10 mg.   DESCRIPTION OF PROCEDURE:  With patient mildly sedated in the left lateral  decubitus position, the Olympus videoscopic colonoscope was inserted in the  rectum and passed under direct vision through a very tortuous colon to the  cecum, identified by the ileocecal valve and appendiceal orifice, both of  which were photographed.  From this point, the colonoscope was slowly  withdrawn, taking circumferential views of the entire colonic mucosa,  stopping only then in the rectum which appeared normal on direct and  retroflexed view.  The endoscope was straightened and withdrawn.  The  patient's vital signs and pulse oximeter remained stable.  The patient  tolerated the procedure well without apparent complications.   FINDINGS:  A negative colonoscopic examination.   PLAN:  Repeat examination in five years.                                               Georgiana Spinner, M.D.    GMO/MEDQ  D:  12/06/2001  T:  12/06/2001  Job:  [id number]

## 2010-07-29 ENCOUNTER — Ambulatory Visit (INDEPENDENT_AMBULATORY_CARE_PROVIDER_SITE_OTHER): Payer: Medicare Other | Admitting: *Deleted

## 2010-07-29 DIAGNOSIS — I4891 Unspecified atrial fibrillation: Secondary | ICD-10-CM

## 2010-07-29 LAB — POCT INR: INR: 1.7

## 2010-08-05 ENCOUNTER — Ambulatory Visit (INDEPENDENT_AMBULATORY_CARE_PROVIDER_SITE_OTHER): Payer: Medicare Other | Admitting: *Deleted

## 2010-08-05 DIAGNOSIS — Z7901 Long term (current) use of anticoagulants: Secondary | ICD-10-CM

## 2010-08-05 DIAGNOSIS — I4891 Unspecified atrial fibrillation: Secondary | ICD-10-CM

## 2010-08-18 ENCOUNTER — Ambulatory Visit (INDEPENDENT_AMBULATORY_CARE_PROVIDER_SITE_OTHER): Payer: Medicare Other | Admitting: *Deleted

## 2010-08-18 DIAGNOSIS — I4891 Unspecified atrial fibrillation: Secondary | ICD-10-CM

## 2010-08-18 DIAGNOSIS — Z7901 Long term (current) use of anticoagulants: Secondary | ICD-10-CM

## 2010-09-15 ENCOUNTER — Ambulatory Visit (INDEPENDENT_AMBULATORY_CARE_PROVIDER_SITE_OTHER): Payer: Medicare Other | Admitting: *Deleted

## 2010-09-15 DIAGNOSIS — Z7901 Long term (current) use of anticoagulants: Secondary | ICD-10-CM

## 2010-09-15 DIAGNOSIS — I4891 Unspecified atrial fibrillation: Secondary | ICD-10-CM

## 2010-10-06 ENCOUNTER — Ambulatory Visit (INDEPENDENT_AMBULATORY_CARE_PROVIDER_SITE_OTHER): Payer: Medicare Other | Admitting: *Deleted

## 2010-10-06 DIAGNOSIS — Z7901 Long term (current) use of anticoagulants: Secondary | ICD-10-CM

## 2010-10-06 DIAGNOSIS — I4891 Unspecified atrial fibrillation: Secondary | ICD-10-CM

## 2010-10-06 LAB — POCT INR: INR: 3.4

## 2010-10-20 ENCOUNTER — Ambulatory Visit (INDEPENDENT_AMBULATORY_CARE_PROVIDER_SITE_OTHER): Payer: Medicare Other | Admitting: *Deleted

## 2010-10-20 DIAGNOSIS — I4891 Unspecified atrial fibrillation: Secondary | ICD-10-CM

## 2010-10-20 DIAGNOSIS — Z7901 Long term (current) use of anticoagulants: Secondary | ICD-10-CM

## 2010-10-20 LAB — POCT INR: INR: 2.2

## 2010-11-04 ENCOUNTER — Ambulatory Visit (INDEPENDENT_AMBULATORY_CARE_PROVIDER_SITE_OTHER): Payer: Medicare Other | Admitting: *Deleted

## 2010-11-04 DIAGNOSIS — I4891 Unspecified atrial fibrillation: Secondary | ICD-10-CM

## 2010-11-04 DIAGNOSIS — Z7901 Long term (current) use of anticoagulants: Secondary | ICD-10-CM

## 2010-11-04 LAB — POCT INR: INR: 2.2

## 2010-11-05 ENCOUNTER — Other Ambulatory Visit: Payer: Self-pay | Admitting: *Deleted

## 2010-11-05 MED ORDER — DIGOXIN 125 MCG PO TABS: 125.0000 ug | ORAL_TABLET | Freq: Every day | ORAL | Status: DC

## 2010-11-05 MED ORDER — DILTIAZEM HCL ER COATED BEADS 120 MG PO CP24: 120.0000 mg | ORAL_CAPSULE | Freq: Every day | ORAL | Status: DC

## 2010-11-13 ENCOUNTER — Ambulatory Visit (INDEPENDENT_AMBULATORY_CARE_PROVIDER_SITE_OTHER): Payer: Medicare Other | Admitting: Cardiology

## 2010-11-13 ENCOUNTER — Encounter: Payer: Self-pay | Admitting: Cardiology

## 2010-11-13 DIAGNOSIS — I2510 Atherosclerotic heart disease of native coronary artery without angina pectoris: Secondary | ICD-10-CM

## 2010-11-13 DIAGNOSIS — I4891 Unspecified atrial fibrillation: Secondary | ICD-10-CM

## 2010-11-13 DIAGNOSIS — I4892 Unspecified atrial flutter: Secondary | ICD-10-CM

## 2010-11-13 NOTE — Assessment & Plan Note (Signed)
She has a history of atrial fibrillation as well as atrial flutter. I don't know that some of her symptoms currently may be related to intermittent arrhythmia since she is asymptomatic otherwise. We will have her wear a 24-hour Holter monitor to see she is having significant breakthrough. If so should we may need to consider another trial of antiarrhythmic drug therapy. I suspect that her exertional fatigue and lightheadedness are related to orthostatic blood pressure drop. If her Holter monitor is unremarkable I would recommend stopping her Maxide. She is scheduled for complete lab work with Dr. Renne Crigler in November.

## 2010-11-13 NOTE — Assessment & Plan Note (Signed)
She has a history of nonobstructive disease with a normal nuclear stress test in March of this year. She's had no typical anginal symptoms.

## 2010-11-13 NOTE — Patient Instructions (Signed)
We will have you wear a monitor for 48 hours.  Continue your medication for now.

## 2010-11-13 NOTE — Progress Notes (Signed)
Patty [name] Date of Birth: Dec 16, 1940   History of Present Illness: Patty [name] is seen today for a followup visit. She has a history of paroxysmal atrial flutter. She is on chronic anticoagulation. She previously was unable to tolerate Multaq. She has had fairly extensive cardiac evaluation over the past 2 years. She had a cardiac catheterization in August of 2010 which showed mild nonobstructive coronary disease. She had a normal nuclear stress test in March of 2012. Echocardiogram in March of 2012 showed normal left ventricular function with moderate LVH. The left atrium was mildly dilated. Right atrium was mildly dilated. On followup today she reports an episode of chest discomfort the last week of August. This was relieved with aspirin. She does complain that she gets dizzy after she stands up and after a few moments starts to walk. She gets very lightheaded but has not passed out. She complains that she gives out easily walking uphill with leg fatigue and some shortness of breath. Her last ECG here in March showed atrial flutter with controlled ventricular response. She has not been able to judge when she is in and out  of rhythm.  Current Outpatient Prescriptions on File Prior to Visit  Medication Sig Dispense Refill  . acetaminophen (TYLENOL) 500 MG chewable tablet Chew 1,000 mg by mouth every 6 (six) hours as needed.        Marland Kitchen albuterol (PROVENTIL HFA;VENTOLIN HFA) 108 (90 BASE) MCG/ACT inhaler Inhale 2 puffs into the lungs every 6 (six) hours as needed.        Marland Kitchen aspirin 81 MG tablet Take 81 mg by mouth daily.        Marland Kitchen atorvastatin (LIPITOR) 20 MG tablet Take 10 mg by mouth daily. Take as directed      . Cholecalciferol (VITAMIN D) 1000 UNITS capsule Take 2,000 Units by mouth daily.       Marland Kitchen dicyclomine (BENTYL) 10 MG capsule Take 10 mg by mouth as needed.       . digoxin (LANOXIN) 0.125 MG tablet Take 1 tablet (125 mcg total) by mouth daily.  30 tablet  6  . diltiazem (CARDIZEM CD)  120 MG 24 hr capsule Take 1 capsule (120 mg total) by mouth daily.  30 capsule  6  . ESTRACE VAGINAL 0.1 MG/GM vaginal cream Place vaginally as directed.      . fluticasone-salmeterol (ADVAIR HFA) 115-21 MCG/ACT inhaler Inhale 2 puffs into the lungs 2 (two) times daily.        Marland Kitchen LOVAZA 1 G capsule Take 2 tablets by mouth 2 (two) times daily.      . magnesium gluconate (MAGONATE) 500 MG tablet Take 500 mg by mouth daily.       . potassium chloride SA (K-DUR,KLOR-CON) 20 MEQ tablet Take 1 tablet by mouth Daily.      Marland Kitchen SINGULAIR 10 MG tablet Take 1 tablet by mouth Daily.      Marland Kitchen SYNTHROID 200 MCG tablet Take 1 tablet by mouth Daily.      Marland Kitchen triamterene-hydrochlorothiazide (MAXZIDE-25) 37.5-25 MG per tablet Take 1 tablet by mouth daily. 1/2 tablet daily      . Vitamin D, Ergocalciferol, (DRISDOL) 50000 UNITS CAPS Take 1 tablet by mouth Every month.      . warfarin (COUMADIN) 5 MG tablet Take by mouth as directed.        Allergies  Allergen Reactions  . Demerol   . Meperidine Hcl     REACTION: vomiting  . Penicillins  REACTION: itching, rash  . Sulfa Drugs Cross Reactors     Past Medical History  Diagnosis Date  . Arthritis 06/15/2010  . Asthma 06/15/2010  . Hypothyroidism 06/15/2010  . Hyperlipemia 06/15/2010  . Atrial fibrillation 05/28/2010    Managed with rate control and coumadin  . Long-term (current) use of anticoagulants   . Diabetes mellitus   . Obesity   . ACE-inhibitor cough     Past Surgical History  Procedure Date  . Appendectomy 1953  . Partial hysterectomy 1972  . Cholecystectomy 1993  . Back surgery     x2  . Cataract extraction   . Cardiac catheterization 10/10/2008    Nonobstructive CAD    History  Smoking status  . Never Smoker   Smokeless tobacco  . Never Used    History  Alcohol Use No    Family History  Problem Relation Age of Onset  . Stroke Father     at 73  . Heart attack Father     at 65  . Heart disease Mother   . Arthritis Mother     . Breast cancer Mother     c  . Heart failure Mother   . Heart attack Brother     Review of Systems: The review of systems is positive for fatigue on exertion. She reports that her blood pressure has been under good control. All other systems were reviewed and are negative.  Physical Exam: BP 134/70  Pulse 82  Ht 5\' 6"  (1.676 m)  Wt 157 lb 3.2 oz (71.305 kg)  BMI 25.37 kg/m2 Repeat orthostatic blood pressure check demonstrates a blood pressure of 142/78 supine and 126/60 standing. Pulse is 74 and regular.The patient is alert and oriented x 3.  The mood and affect are normal.  The skin is warm and dry.  Color is normal.  The HEENT exam reveals that the sclera are nonicteric.  The mucous membranes are moist.  The carotids are 2+ without bruits.  There is no thyromegaly.  There is no JVD.  The lungs are clear.  The chest wall is non tender.  The heart exam reveals a regular rate with a normal S1 and S2.  There are no murmurs, gallops, or rubs.  The PMI is not displaced.   Abdominal exam reveals good bowel sounds.  There is no guarding or rebound.  There is no hepatosplenomegaly or tenderness.  There are no masses.  Exam of the legs reveal no clubbing, cyanosis, or edema.  The legs are without rashes.  The distal pulses are intact.  Cranial nerves II - XII are intact.  Motor and sensory functions are intact.  The gait is normal.  LABORATORY DATA: ECG today demonstrates normal sinus rhythm with first degree AV block. There is left axis deviation and a right bundle branch block.  Assessment / Plan:

## 2010-11-17 ENCOUNTER — Other Ambulatory Visit: Payer: Self-pay | Admitting: *Deleted

## 2010-11-17 ENCOUNTER — Encounter: Payer: Self-pay | Admitting: Cardiology

## 2010-11-17 MED ORDER — POTASSIUM CHLORIDE CRYS ER 20 MEQ PO TBCR: 20.0000 meq | EXTENDED_RELEASE_TABLET | Freq: Every day | ORAL | Status: DC

## 2010-11-17 NOTE — Telephone Encounter (Signed)
Refilled Meds from fax  

## 2010-11-19 ENCOUNTER — Telehealth: Payer: Self-pay | Admitting: *Deleted

## 2010-11-19 NOTE — Telephone Encounter (Signed)
Notified of holter monitor results. Per Dr. Sw[place] advised to stop Maxide. Advised pt to monitor BP and HR.

## 2010-11-30 ENCOUNTER — Other Ambulatory Visit: Payer: Self-pay | Admitting: Obstetrics and Gynecology

## 2010-11-30 DIAGNOSIS — Z1231 Encounter for screening mammogram for malignant neoplasm of breast: Secondary | ICD-10-CM

## 2010-12-02 ENCOUNTER — Ambulatory Visit (INDEPENDENT_AMBULATORY_CARE_PROVIDER_SITE_OTHER): Payer: Medicare Other | Admitting: *Deleted

## 2010-12-02 DIAGNOSIS — Z7901 Long term (current) use of anticoagulants: Secondary | ICD-10-CM

## 2010-12-02 DIAGNOSIS — I4891 Unspecified atrial fibrillation: Secondary | ICD-10-CM

## 2010-12-07 ENCOUNTER — Encounter: Payer: Self-pay | Admitting: Gynecology

## 2010-12-09 ENCOUNTER — Other Ambulatory Visit: Payer: Self-pay | Admitting: Dermatology

## 2010-12-11 ENCOUNTER — Encounter: Payer: Self-pay | Admitting: Obstetrics and Gynecology

## 2010-12-11 ENCOUNTER — Ambulatory Visit (INDEPENDENT_AMBULATORY_CARE_PROVIDER_SITE_OTHER): Payer: Medicare Other | Admitting: Obstetrics and Gynecology

## 2010-12-11 VITALS — BP 130/70 | Ht 66.5 in | Wt 161.0 lb

## 2010-12-11 DIAGNOSIS — N951 Menopausal and female climacteric states: Secondary | ICD-10-CM

## 2010-12-11 DIAGNOSIS — B373 Candidiasis of vulva and vagina: Secondary | ICD-10-CM

## 2010-12-11 DIAGNOSIS — N390 Urinary tract infection, site not specified: Secondary | ICD-10-CM

## 2010-12-11 DIAGNOSIS — Z780 Asymptomatic menopausal state: Secondary | ICD-10-CM

## 2010-12-11 DIAGNOSIS — B3731 Acute candidiasis of vulva and vagina: Secondary | ICD-10-CM

## 2010-12-11 DIAGNOSIS — N952 Postmenopausal atrophic vaginitis: Secondary | ICD-10-CM

## 2010-12-11 DIAGNOSIS — Z124 Encounter for screening for malignant neoplasm of cervix: Secondary | ICD-10-CM

## 2010-12-11 MED ORDER — FLUCONAZOLE 150 MG PO TABS: 150.0000 mg | ORAL_TABLET | Freq: Once | ORAL | Status: AC

## 2010-12-11 NOTE — Progress Notes (Signed)
Subjective:     Patient ID: Patty [name], female   DOB: [birth year], 70 y.o.   MRN: [id number]  HPIpatient came back to see me today. She has had persistent UTIs. She sees Dr. Bjorn Pippin. She has been on long-term antibiotics. He does not feel she's emptied her bladder well. She is up-to-date on her mammograms. She does her bone density through PCP. She is having no vaginal bleeding or pelvic pain. She suspects she has a yeast infection with vaginal discharge. This would not be surprising with her constant use of antibiotics. She continues to use Estrace vaginal cream for vaginitis with good results. She still has some menopausal symptoms such as hot flashes but they are tolerable.   Review of Systems  Constitutional:       Hypothyroidism  HENT: Negative.   Eyes: Negative.   Respiratory: Negative.        Asthma  Cardiovascular:       She sees Dr. Sw[place]and. She has been treated for hyperlipidemia, extremity fluid retention, atrial fibrillation. History of cad  Gastrointestinal: Negative.        Previous colon cancer. No existing disease  Genitourinary:       Persistent urinary tract infections. Urinary retention  Musculoskeletal: Negative.   Skin: Negative.   Neurological:       Sleep apnea  Hematological: Negative.   Psychiatric/Behavioral: Negative.        Objective:   Physical ExamHEENT: Within normal limits. Neck: No masses. Supraclavicular lymph nodes: Not enlarged. Breasts: Examined in both sitting and lying position. Symmetrical without skin changes or masses. Abdomen: Soft no masses guarding or rebound. No hernias. Pelvic: External within normal limits. BUS within normal limits. Vaginal examination shows good estrogen effect, no cystocele enterocele or rectocele. Cervix and uterus absent. Adnexa within normal limits. Rectovaginal confirmatory. Extremities within normal limits. Wet prep positive for yeast     Assessment:     #1. Recurrent urinary tract infections #2.  Atrophic vaginitis #3. Yeast vaginitis #4. Menopausal symptoms    Plan:     Continue Estrace vaginal cream. Continue yearly mammograms. Continue Diflucan 150 mg daily for 3 days

## 2010-12-30 ENCOUNTER — Ambulatory Visit (INDEPENDENT_AMBULATORY_CARE_PROVIDER_SITE_OTHER): Payer: Medicare Other | Admitting: *Deleted

## 2010-12-30 DIAGNOSIS — Z7901 Long term (current) use of anticoagulants: Secondary | ICD-10-CM

## 2010-12-30 DIAGNOSIS — I4891 Unspecified atrial fibrillation: Secondary | ICD-10-CM

## 2010-12-31 ENCOUNTER — Other Ambulatory Visit: Payer: Self-pay | Admitting: Pharmacist

## 2010-12-31 MED ORDER — WARFARIN SODIUM 5 MG PO TABS: ORAL_TABLET | ORAL | Status: DC

## 2011-01-08 ENCOUNTER — Ambulatory Visit
Admission: RE | Admit: 2011-01-08 | Discharge: 2011-01-08 | Disposition: A | Discharge status: Home / Self Care | Payer: Medicare Other | Source: Ambulatory Visit | Attending: Obstetrics and Gynecology | Admitting: Obstetrics and Gynecology

## 2011-01-08 DIAGNOSIS — Z1231 Encounter for screening mammogram for malignant neoplasm of breast: Secondary | ICD-10-CM

## 2011-01-26 ENCOUNTER — Other Ambulatory Visit: Payer: Self-pay | Admitting: Specialist

## 2011-01-26 DIAGNOSIS — M545 Low back pain: Secondary | ICD-10-CM

## 2011-01-27 ENCOUNTER — Ambulatory Visit (INDEPENDENT_AMBULATORY_CARE_PROVIDER_SITE_OTHER): Payer: Medicare Other | Admitting: Cardiology

## 2011-01-27 ENCOUNTER — Encounter: Payer: Self-pay | Admitting: Cardiology

## 2011-01-27 VITALS — BP 116/68 | HR 83 | Ht 66.5 in | Wt 156.4 lb

## 2011-01-27 DIAGNOSIS — I2510 Atherosclerotic heart disease of native coronary artery without angina pectoris: Secondary | ICD-10-CM

## 2011-01-27 DIAGNOSIS — I4891 Unspecified atrial fibrillation: Secondary | ICD-10-CM

## 2011-01-27 DIAGNOSIS — I480 Paroxysmal atrial fibrillation: Secondary | ICD-10-CM

## 2011-01-27 DIAGNOSIS — R0600 Dyspnea, unspecified: Secondary | ICD-10-CM

## 2011-01-27 DIAGNOSIS — R0989 Other specified symptoms and signs involving the circulatory and respiratory systems: Secondary | ICD-10-CM

## 2011-01-27 NOTE — Progress Notes (Signed)
Patty [name] Date of Birth: [birth year]   History of Present Illness: Patty [name] is seen today for a followup visit. She has a history of paroxysmal atrial flutter. She is on chronic anticoagulation. She previously was unable to tolerate Multaq. She has had fairly extensive cardiac evaluation over the past 2 years. She had a cardiac catheterization in August of 2010 which showed mild nonobstructive coronary disease. She had a normal nuclear stress test in March of 2012. Echocardiogram in March of 2012 showed normal left ventricular function with moderate LVH. The left atrium was mildly dilated. Right atrium was mildly dilated. On followup today she reports that she is doing fairly well. She is experiencing symptoms of shortness of breath on exertion particularly when she goes up hills or stairs. She has had no significant palpitations. She only had one episode on the day before Thanksgiving where she felt her heartbeat was different but this lasted only 20 minutes. She still feels "cloudy" when she exerts herself.  Current Outpatient Prescriptions on File Prior to Visit  Medication Sig Dispense Refill  . acetaminophen (TYLENOL) 500 MG chewable tablet Chew 1,000 mg by mouth every 6 (six) hours as needed.        Marland Kitchen albuterol (PROVENTIL HFA;VENTOLIN HFA) 108 (90 BASE) MCG/ACT inhaler Inhale 2 puffs into the lungs every 6 (six) hours as needed.        Marland Kitchen aspirin 81 MG tablet Take 81 mg by mouth daily.        Marland Kitchen dicyclomine (BENTYL) 10 MG capsule Take 10 mg by mouth as needed.       . digoxin (LANOXIN) 0.125 MG tablet Take 1 tablet (125 mcg total) by mouth daily.  30 tablet  6  . diltiazem (CARDIZEM CD) 120 MG 24 hr capsule Take 1 capsule (120 mg total) by mouth daily.  30 capsule  6  . ESTRACE VAGINAL 0.1 MG/GM vaginal cream Place vaginally as directed.      Marland Kitchen LOVAZA 1 G capsule Take 2 tablets by mouth 2 (two) times daily.      . magnesium gluconate (MAGONATE) 500 MG tablet Take 500 mg by mouth  daily.       . potassium chloride SA (K-DUR,KLOR-CON) 20 MEQ tablet Take 1 tablet (20 mEq total) by mouth daily.  30 tablet  5  . SINGULAIR 10 MG tablet Take 1 tablet by mouth Daily.      Marland Kitchen SYNTHROID 200 MCG tablet Take 1 tablet by mouth Daily. Taking      . UNABLE TO FIND C PAP for sleep apnea       . warfarin (COUMADIN) 5 MG tablet Take as directed by Anticoagulation clinic.  Pt takes up to 1 tablet daily.  30 tablet  3    Allergies  Allergen Reactions  . Demerol   . Meperidine Hcl     REACTION: vomiting  . Oysters (Shellfish Allergy)   . Penicillins     REACTION: itching, rash  . Sulfa Drugs Cross Reactors     Past Medical History  Diagnosis Date  . Arthritis 06/15/2010  . Asthma 06/15/2010  . Hypothyroidism 06/15/2010  . Hyperlipemia 06/15/2010  . Atrial fibrillation 05/28/2010    Managed with rate control and coumadin  . Long-term (current) use of anticoagulants   . Diabetes mellitus   . Obesity   . ACE-inhibitor cough   . DI (detrusor instability)   . Fibroid   . Rectocele   . Atrophic vaginitis   .  Sleep apnea   . Cancer 2008    Colon polyp-early adenoCA    Past Surgical History  Procedure Date  . Appendectomy 1953  . Partial hysterectomy 1972  . Cholecystectomy 1993  . Back surgery     x2  . Cataract extraction   . Cardiac catheterization 10/10/2008    Nonobstructive CAD  . Abdominal hysterectomy 1972    TAH  . Foot surgery   . Tonsillectomy   . Pubovag repair with sling   . Skin tag removal 2012    leg    History  Smoking status  . Never Smoker   Smokeless tobacco  . Never Used    History  Alcohol Use No    Family History  Problem Relation Age of Onset  . Stroke Father     at 49  . Heart attack Father     at 71  . Hypertension Father   . Heart disease Mother   . Arthritis Mother   . Breast cancer Mother     c  . Heart failure Mother   . Heart attack Brother   . Hypertension Brother   . Diabetes Sister     Review of  Systems: The review of systems is positive for fatigue on exertion. She reports that her blood pressure has been under good control. All other systems were reviewed and are negative.  Physical Exam: BP 116/68  Pulse 83  Ht 5' 6.5" (1.689 m)  Wt 156 lb 6.4 oz (70.943 kg)  BMI 24.87 kg/m2 Repeat orthostatic blood pressure check demonstrates a blood pressure of 142/78 supine and 126/60 standing. Pulse is 74 and regular.The patient is alert and oriented x 3.  The mood and affect are normal.  The skin is warm and dry.  Color is normal.  The HEENT exam reveals that the sclera are nonicteric.  The mucous membranes are moist.  The carotids are 2+ without bruits.  There is no thyromegaly.  There is no JVD.  The lungs are clear.  The chest wall is non tender.  The heart exam reveals a regular rate with a normal S1 and S2.  There are no murmurs, gallops, or rubs.  The PMI is not displaced.   Abdominal exam reveals good bowel sounds.  There is no guarding or rebound.  There is no hepatosplenomegaly or tenderness.  There are no masses.  Exam of the legs reveal no clubbing, cyanosis, or edema.  The legs are without rashes.  The distal pulses are intact.  Cranial nerves II - XII are intact.  Motor and sensory functions are intact.  The gait is normal.  LABORATORY DATA: Her event monitor only demonstrated occasional PACs.  Assessment / Plan:

## 2011-01-27 NOTE — Patient Instructions (Signed)
Continue your current medications.  Stay active.   If you continue to have significant shortness of breath a pulmonary evaluation may be needed.

## 2011-01-27 NOTE — Assessment & Plan Note (Signed)
No documented recurrence since her hospital visit in June. Her event monitor showed no evidence of atrial fibrillation over the past month.

## 2011-01-27 NOTE — Assessment & Plan Note (Signed)
She has had extensive cardiac testing this year which has been unremarkable. I do not see a cardiac cause for her shortness of breath on exertion. There may be a significant deconditioning component to this. I have encouraged her to walk regularly. If her shortness of breath worsens then I would recommend formal pulmonary evaluation.

## 2011-01-28 ENCOUNTER — Ambulatory Visit
Admission: RE | Admit: 2011-01-28 | Discharge: 2011-01-28 | Disposition: A | Discharge status: Home / Self Care | Payer: Medicare Other | Source: Ambulatory Visit | Attending: Specialist | Admitting: Specialist

## 2011-01-28 DIAGNOSIS — M545 Low back pain: Secondary | ICD-10-CM

## 2011-01-28 MED ORDER — GADOBENATE DIMEGLUMINE 529 MG/ML IV SOLN
14.0000 mL | Freq: Once | INTRAVENOUS | Status: AC | PRN
  Administered 2011-01-28: 14 mL via INTRAVENOUS

## 2011-02-10 ENCOUNTER — Ambulatory Visit (INDEPENDENT_AMBULATORY_CARE_PROVIDER_SITE_OTHER): Payer: Medicare Other | Admitting: *Deleted

## 2011-02-10 DIAGNOSIS — Z7901 Long term (current) use of anticoagulants: Secondary | ICD-10-CM

## 2011-02-10 DIAGNOSIS — I4891 Unspecified atrial fibrillation: Secondary | ICD-10-CM

## 2011-02-10 LAB — POCT INR: INR: 6

## 2011-02-16 ENCOUNTER — Ambulatory Visit (INDEPENDENT_AMBULATORY_CARE_PROVIDER_SITE_OTHER): Payer: Medicare Other | Admitting: *Deleted

## 2011-02-16 DIAGNOSIS — I4891 Unspecified atrial fibrillation: Secondary | ICD-10-CM

## 2011-02-16 DIAGNOSIS — Z7901 Long term (current) use of anticoagulants: Secondary | ICD-10-CM

## 2011-03-03 ENCOUNTER — Ambulatory Visit (INDEPENDENT_AMBULATORY_CARE_PROVIDER_SITE_OTHER): Payer: Medicare Other | Admitting: *Deleted

## 2011-03-03 DIAGNOSIS — I4891 Unspecified atrial fibrillation: Secondary | ICD-10-CM

## 2011-03-03 DIAGNOSIS — I2510 Atherosclerotic heart disease of native coronary artery without angina pectoris: Secondary | ICD-10-CM | POA: Diagnosis not present

## 2011-03-03 DIAGNOSIS — Z7902 Long term (current) use of antithrombotics/antiplatelets: Secondary | ICD-10-CM | POA: Diagnosis not present

## 2011-03-03 DIAGNOSIS — Z7901 Long term (current) use of anticoagulants: Secondary | ICD-10-CM | POA: Diagnosis not present

## 2011-03-03 LAB — POCT INR: INR: 3.9

## 2011-03-05 DIAGNOSIS — R069 Unspecified abnormalities of breathing: Secondary | ICD-10-CM | POA: Diagnosis not present

## 2011-03-10 DIAGNOSIS — N3020 Other chronic cystitis without hematuria: Secondary | ICD-10-CM | POA: Diagnosis not present

## 2011-03-16 ENCOUNTER — Ambulatory Visit (INDEPENDENT_AMBULATORY_CARE_PROVIDER_SITE_OTHER): Payer: Medicare Other | Admitting: *Deleted

## 2011-03-16 DIAGNOSIS — I4891 Unspecified atrial fibrillation: Secondary | ICD-10-CM | POA: Diagnosis not present

## 2011-03-16 DIAGNOSIS — Z7901 Long term (current) use of anticoagulants: Secondary | ICD-10-CM | POA: Diagnosis not present

## 2011-03-17 ENCOUNTER — Encounter: Payer: Medicare Other | Admitting: *Deleted

## 2011-03-18 ENCOUNTER — Encounter: Payer: Medicare Other | Admitting: *Deleted

## 2011-03-18 DIAGNOSIS — M48061 Spinal stenosis, lumbar region without neurogenic claudication: Secondary | ICD-10-CM | POA: Diagnosis not present

## 2011-03-18 DIAGNOSIS — M4310 Spondylolisthesis, site unspecified: Secondary | ICD-10-CM | POA: Diagnosis not present

## 2011-03-18 DIAGNOSIS — M5430 Sciatica, unspecified side: Secondary | ICD-10-CM | POA: Diagnosis not present

## 2011-03-22 DIAGNOSIS — I4891 Unspecified atrial fibrillation: Secondary | ICD-10-CM | POA: Diagnosis not present

## 2011-03-22 DIAGNOSIS — E039 Hypothyroidism, unspecified: Secondary | ICD-10-CM | POA: Diagnosis not present

## 2011-03-22 DIAGNOSIS — E1169 Type 2 diabetes mellitus with other specified complication: Secondary | ICD-10-CM | POA: Diagnosis not present

## 2011-03-22 DIAGNOSIS — E7800 Pure hypercholesterolemia, unspecified: Secondary | ICD-10-CM | POA: Diagnosis not present

## 2011-03-22 DIAGNOSIS — Z79899 Other long term (current) drug therapy: Secondary | ICD-10-CM | POA: Diagnosis not present

## 2011-03-22 DIAGNOSIS — R0609 Other forms of dyspnea: Secondary | ICD-10-CM | POA: Diagnosis not present

## 2011-03-22 DIAGNOSIS — I10 Essential (primary) hypertension: Secondary | ICD-10-CM | POA: Diagnosis not present

## 2011-03-24 DIAGNOSIS — E039 Hypothyroidism, unspecified: Secondary | ICD-10-CM | POA: Diagnosis not present

## 2011-03-24 DIAGNOSIS — R609 Edema, unspecified: Secondary | ICD-10-CM | POA: Diagnosis not present

## 2011-03-24 DIAGNOSIS — R209 Unspecified disturbances of skin sensation: Secondary | ICD-10-CM | POA: Diagnosis not present

## 2011-03-24 DIAGNOSIS — E119 Type 2 diabetes mellitus without complications: Secondary | ICD-10-CM | POA: Diagnosis not present

## 2011-03-25 ENCOUNTER — Other Ambulatory Visit: Payer: Self-pay | Admitting: Internal Medicine

## 2011-03-25 ENCOUNTER — Ambulatory Visit: Payer: Medicare Other | Attending: Specialist | Admitting: Physical Therapy

## 2011-03-25 DIAGNOSIS — M545 Low back pain, unspecified: Secondary | ICD-10-CM | POA: Insufficient documentation

## 2011-03-25 DIAGNOSIS — IMO0001 Reserved for inherently not codable concepts without codable children: Secondary | ICD-10-CM | POA: Diagnosis not present

## 2011-03-25 DIAGNOSIS — R5381 Other malaise: Secondary | ICD-10-CM | POA: Diagnosis not present

## 2011-03-25 DIAGNOSIS — E049 Nontoxic goiter, unspecified: Secondary | ICD-10-CM

## 2011-03-29 ENCOUNTER — Ambulatory Visit
Admission: RE | Admit: 2011-03-29 | Discharge: 2011-03-29 | Disposition: A | Discharge status: Home / Self Care | Payer: Medicare Other | Source: Ambulatory Visit | Attending: Internal Medicine | Admitting: Internal Medicine

## 2011-03-29 DIAGNOSIS — E039 Hypothyroidism, unspecified: Secondary | ICD-10-CM | POA: Diagnosis not present

## 2011-03-29 DIAGNOSIS — E049 Nontoxic goiter, unspecified: Secondary | ICD-10-CM

## 2011-03-29 DIAGNOSIS — E7800 Pure hypercholesterolemia, unspecified: Secondary | ICD-10-CM | POA: Diagnosis not present

## 2011-03-29 DIAGNOSIS — I10 Essential (primary) hypertension: Secondary | ICD-10-CM | POA: Diagnosis not present

## 2011-03-30 ENCOUNTER — Encounter: Payer: Medicare Other | Admitting: *Deleted

## 2011-03-30 ENCOUNTER — Ambulatory Visit: Payer: Medicare Other | Admitting: Physical Therapy

## 2011-03-30 DIAGNOSIS — M545 Low back pain: Secondary | ICD-10-CM | POA: Diagnosis not present

## 2011-03-30 DIAGNOSIS — IMO0001 Reserved for inherently not codable concepts without codable children: Secondary | ICD-10-CM | POA: Diagnosis not present

## 2011-03-30 DIAGNOSIS — Z7901 Long term (current) use of anticoagulants: Secondary | ICD-10-CM | POA: Diagnosis not present

## 2011-03-30 DIAGNOSIS — I10 Essential (primary) hypertension: Secondary | ICD-10-CM | POA: Diagnosis not present

## 2011-03-30 DIAGNOSIS — I4891 Unspecified atrial fibrillation: Secondary | ICD-10-CM | POA: Diagnosis not present

## 2011-03-30 DIAGNOSIS — R5381 Other malaise: Secondary | ICD-10-CM | POA: Diagnosis not present

## 2011-03-31 DIAGNOSIS — R0602 Shortness of breath: Secondary | ICD-10-CM | POA: Diagnosis not present

## 2011-03-31 DIAGNOSIS — I10 Essential (primary) hypertension: Secondary | ICD-10-CM | POA: Diagnosis not present

## 2011-04-01 ENCOUNTER — Ambulatory Visit: Payer: Medicare Other | Admitting: Physical Therapy

## 2011-04-01 ENCOUNTER — Encounter (HOSPITAL_COMMUNITY): Payer: Self-pay | Admitting: Pharmacy Technician

## 2011-04-01 DIAGNOSIS — IMO0001 Reserved for inherently not codable concepts without codable children: Secondary | ICD-10-CM | POA: Diagnosis not present

## 2011-04-01 DIAGNOSIS — M545 Low back pain: Secondary | ICD-10-CM | POA: Diagnosis not present

## 2011-04-01 DIAGNOSIS — R5381 Other malaise: Secondary | ICD-10-CM | POA: Diagnosis not present

## 2011-04-06 ENCOUNTER — Ambulatory Visit: Payer: Medicare Other | Attending: Specialist | Admitting: Physical Therapy

## 2011-04-06 DIAGNOSIS — IMO0001 Reserved for inherently not codable concepts without codable children: Secondary | ICD-10-CM | POA: Diagnosis not present

## 2011-04-06 DIAGNOSIS — M545 Low back pain, unspecified: Secondary | ICD-10-CM | POA: Insufficient documentation

## 2011-04-06 DIAGNOSIS — R5381 Other malaise: Secondary | ICD-10-CM | POA: Diagnosis not present

## 2011-04-08 ENCOUNTER — Ambulatory Visit: Payer: Medicare Other | Admitting: Physical Therapy

## 2011-04-08 ENCOUNTER — Encounter: Payer: Self-pay | Admitting: Internal Medicine

## 2011-04-08 DIAGNOSIS — I4891 Unspecified atrial fibrillation: Secondary | ICD-10-CM | POA: Diagnosis not present

## 2011-04-08 DIAGNOSIS — R5381 Other malaise: Secondary | ICD-10-CM | POA: Diagnosis not present

## 2011-04-08 DIAGNOSIS — M545 Low back pain: Secondary | ICD-10-CM | POA: Diagnosis not present

## 2011-04-08 DIAGNOSIS — IMO0001 Reserved for inherently not codable concepts without codable children: Secondary | ICD-10-CM | POA: Diagnosis not present

## 2011-04-12 ENCOUNTER — Ambulatory Visit: Payer: Medicare Other | Admitting: Physical Therapy

## 2011-04-12 DIAGNOSIS — R5381 Other malaise: Secondary | ICD-10-CM | POA: Diagnosis not present

## 2011-04-12 DIAGNOSIS — IMO0001 Reserved for inherently not codable concepts without codable children: Secondary | ICD-10-CM | POA: Diagnosis not present

## 2011-04-12 DIAGNOSIS — I4891 Unspecified atrial fibrillation: Secondary | ICD-10-CM | POA: Diagnosis not present

## 2011-04-12 DIAGNOSIS — M545 Low back pain: Secondary | ICD-10-CM | POA: Diagnosis not present

## 2011-04-12 NOTE — H&P (Signed)
CC; Dyspnea.  HISTORY OF PRESENTING ILLNESS: Patient presents/referred  for follow-up evaluation and treatment of dyspnea and dizziness.   Patient c/o shortness of breath.    Shortness of breath gradual and has been ongoing for the last  several years.     Dyspnea started years ago. Has been worsening over last several one year.   Minimal exertion brings on symptoms. Worse with climbing stairs or uphill walking and relieved with rest.  This includes routine chores.   No PND  or orthopnea.    No palpitations.    No associated chest pain or palpitations.     Relieved with rest. Patient was admitted to [place] in March 2012 for A. Fibrillation with RVR   Patient presents for evaluation of dizziness.    Symptoms started  6 months ago. There has been recurrent episodes.  Episodes occur with patient sitting first and then walking to do activity followed by event.    Associated symptoms include no chest pain or dyspnea or palpitations.  No frank syncope.Gets dizzy when she first gets up from bed or from sitting.    ROS: Arthritis-yes, back, life-style limiting-no;  Cramping of the legs and feet at night or with activity-yes, at night; Diabetes-yes, Controlled-yes, with diet;  Hypothyroidism-yes (HRT);  Previous Stroke-no, Previous GI Bleed-no ,  Recent weight change-no . Erectile dysfunction-n/a, Symptoms to suggest sleep apnea like loud snoring, daytime sleepiness-yes, sleeps with CPAP, Other systems negative.   Past Medical History (Major events, hospitalizations, surgeries):  Heart cath 2010 (no stents-Dr. Peter Swaziland) Mild coronary calcification, Normal EF. A dmitted to [place] in March 2012 for A. Fibrillation with RVR. Stress cardiolite 3/12 [facility]) no ischemia Echo 2012 mild LVH. Normal EF.  Sleep study 2004 (Dx'd with sleep apnea-sleeps with CPAP every night).   Tonsillectomy 1950. Appendectomy 1953. Hysterectomy in the 70's. 2 back surgeries in 1984 and 1986. Cholecystectomy 1998.  Cataract surgery in both eyes 1992.    Known allergies: Allergies to Demerol causing vomiting, penicillin causes rash, sulfa causes rash, Multaq also causes abnormal liver enzymes and intolerant.    Ongoing medical problems: Afib. Sleep apnea (sleeps with CPAP). Hypothyroidism. Hypertension. Type II Diabetes Mellitus (controlled with diet). Asthma. Hyperlipidemia. Arthritis (back).  ALLERGIES:       Severe Demerol HCl (meperidine) oral tablet allergy resulting in Vomiting (abdominal)      Severe Penicillin V Potassium oral tablet allergy resulting in Rash - generalized (skin)      Severe SulfADIAZINE oral tablet allergy resulting in Rash - generalized (skin)      Moderate Multaq (dronedarone) oral tablet allergy resulting in Rash - localized (skin)  Objective: Afebrile, 67/min, 150/82 mm Hg.  GENERAL: Moderately built and normal  body habitus, who is in no acute distress. Alert and oriented  x 3. Appears stated age.  There is no cyanosis.   HEENT: normal limits.  NECK: no JVD noted.  CAROTIDS: No carotid bruits are noted.    CARDIAC EXAM: S1 variable and irregular, S2 normal, No murmur or gallop.    CHEST EXAM: No tenderness of chest wall.  LUNGS: Clear to percuss and auscultate. No rales or ronchi.    ABDOMEN: Soft non tender, No organomegaly. BS positive in all 4 quadrants.    MUSCULOSKELETAL EXAM: Intact with full range of motion in all 4 extremities.  NEUROLOGIC EXAM: Grossly intact without any focal deficits. Alert O x 3. Mild LE edema below knee.    VASCULAR EXAM: No skin breakdown. Carotids  normal. Extremities: Femoral pulse normal. Popliteal pulse normal ; Pedal pulse normal.  Otherwise No prominent pulse felt in the abdomen. No varicose veins.  Impression:  Patient presents/referred  for follow-up evaluation and treatment of dyspnea and dizziness. Patient c/o shortness of breath. Shortness of breath gradual and has been ongoing for the last  several years. Dyspnea started years  ago. Has been worsening over last several one year. Minimal exertion brings on symptoms. Worse with climbing stairs or uphill walking and relieved with rest.  This includes routine chores. No PND  or orthopnea. No Palpitations. No associated chest pain or palpitations. Relieved with rest. Patient was admitted to [place] in March 2012 for A. Fibrillation with RVR  1. Shortness of breath/Dyspnea on exertion. 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 Fibrillation. Anginal equivalent  probably not.   Heart cath 2010 (no stents-Dr. Peter Swaziland) Mild coronary calcification, Normal EF. Admitted to [place] in March 2012 for A. Fibrillation with RVR. Stress cardiolite 3/12 [facility]) no ischemia Echo 2012 mild LVH. Normal EF.  ECG 03/22/11: A. Fibrillation with V-rate 67-71/min. No specific ST-T change.  Cardiopulmonary stress test done 03/03/11 reveals mild decrease in peak VO2.  Peak work load achieved maximal at 60%.  Anaerobic threshold of 7.8 (>8.9).  Pulmonary status: FEV1 71% MVV of 55% each both low. Clinical summary: Low peak stroke volume and low anaerobic threshold consistent with impaired cardiac function.  Blunted systolic blood pressure response with exercise. Mild ventilation perfusion mismatch with increased dead space.  Abnormal pulmonary status and response.   Impression: High-risk for surgery anerobic threshold < 11 mL per kilogram per minute. Normal CBC, TSH, Digitalis level. 2. Chronic A. Fibrillation rate controlled.   3. Hypertension at goal. BP 150/84 mm Hg.   4. Diabetes Mellitus II controlled.   5. Hyperlipidemia.   6. Chronic coumadin therapy. On 03/16/11, the reading was 3.3 & on 03/03/11, the reading was 4.0.  Needs to be set up for coumadin clinic. 7. Sleep apnea on CPAP and compliant.  Plan:  Mechanism of underlying disease process and action of medications discussed with the patient.   I discussed primary/secondary  prevention and also dietary counceling was done.  Patient's records from previous admissions to the hospital and outpatient visits with Dr. Swaziland where evaluated.  She has had shortness of breath dating back to 2011.  But patient states that clearly her shortness of breath has worsened in the last few months.  She is obviously not in congestive heart failure by physical examination.  She also complains of trace lower extremity edema that I saw today and states that her legs do swell up significantly.  She was doing well on hydrochlorothiazide but it has since been held and she would like to restart this.  I will restart hydrochlorothiazide 25 mg one half tablet by mouth daily as her blood pressure is elevated today. I had extensive discussion with the patient regarding the mechanism, potential etiology for her dyspnea and her husband was present at the bedside.  I obtained a pro time and INR that was done by Dr. Swaziland and she is being therapeutic or supratherapeutic in the last 2 months.  I will obtain an echocardiogram to evaluate her left atrial size.  I also want to evaluate her left ventral systolic function.  I will set her up for outpatient direct current cardioversion and just now been cardioverted in the past.  I suspect maintaining sinus rhythm  may help her with dyspnea and chronic diastolic heart failure.

## 2011-04-13 ENCOUNTER — Encounter (HOSPITAL_COMMUNITY): Payer: Self-pay | Admitting: Certified Registered Nurse Anesthetist

## 2011-04-13 ENCOUNTER — Other Ambulatory Visit: Payer: Self-pay

## 2011-04-13 ENCOUNTER — Encounter (HOSPITAL_COMMUNITY): Payer: Self-pay | Admitting: Cardiology

## 2011-04-13 ENCOUNTER — Ambulatory Visit (HOSPITAL_COMMUNITY)
Admission: RE | Admit: 2011-04-13 | Discharge: 2011-04-13 | Disposition: A | Discharge status: Home / Self Care | Payer: Medicare Other | Source: Ambulatory Visit | Attending: Cardiology | Admitting: Cardiology

## 2011-04-13 ENCOUNTER — Encounter (HOSPITAL_COMMUNITY)
Admission: RE | Disposition: A | Discharge status: Home / Self Care | Payer: Self-pay | Source: Ambulatory Visit | Attending: Cardiology

## 2011-04-13 ENCOUNTER — Ambulatory Visit (HOSPITAL_COMMUNITY): Payer: Medicare Other | Admitting: Certified Registered Nurse Anesthetist

## 2011-04-13 DIAGNOSIS — R5381 Other malaise: Secondary | ICD-10-CM | POA: Diagnosis not present

## 2011-04-13 DIAGNOSIS — E119 Type 2 diabetes mellitus without complications: Secondary | ICD-10-CM | POA: Diagnosis not present

## 2011-04-13 DIAGNOSIS — I10 Essential (primary) hypertension: Secondary | ICD-10-CM | POA: Diagnosis not present

## 2011-04-13 DIAGNOSIS — E785 Hyperlipidemia, unspecified: Secondary | ICD-10-CM | POA: Insufficient documentation

## 2011-04-13 DIAGNOSIS — IMO0001 Reserved for inherently not codable concepts without codable children: Secondary | ICD-10-CM | POA: Diagnosis not present

## 2011-04-13 DIAGNOSIS — Z7901 Long term (current) use of anticoagulants: Secondary | ICD-10-CM | POA: Insufficient documentation

## 2011-04-13 DIAGNOSIS — I4891 Unspecified atrial fibrillation: Secondary | ICD-10-CM | POA: Insufficient documentation

## 2011-04-13 DIAGNOSIS — G4730 Sleep apnea, unspecified: Secondary | ICD-10-CM | POA: Diagnosis not present

## 2011-04-13 DIAGNOSIS — M545 Low back pain: Secondary | ICD-10-CM | POA: Diagnosis not present

## 2011-04-13 HISTORY — PX: CARDIOVERSION: SHX1299

## 2011-04-13 LAB — BASIC METABOLIC PANEL
BUN: 15 mg/dL (ref 6–23)
Calcium: 10.1 mg/dL (ref 8.4–10.5)
Creatinine, Ser: 0.65 mg/dL (ref 0.50–1.10)
GFR calc Af Amer: >90 mL/min (ref >90)
GFR calc non Af Amer: 87 mL/min — ABNORMAL LOW (ref >90)
Potassium: 4 mEq/L (ref 3.5–5.1)

## 2011-04-13 LAB — CBC
HCT: 34.5 % — ABNORMAL LOW (ref 36.0–46.0)
MCH: 28.8 pg (ref 26.0–34.0)
MCHC: 32.8 g/dL (ref 30.0–36.0)
MCV: 87.8 fL (ref 78.0–100.0)
RDW: 14.4 % (ref 11.5–15.5)

## 2011-04-13 SURGERY — CARDIOVERSION
Anesthesia: Monitor Anesthesia Care | Wound class: Clean

## 2011-04-13 MED ORDER — SODIUM CHLORIDE 0.9 % IJ SOLN: 3.0000 mL | Freq: Two times a day (BID) | INTRAMUSCULAR | Status: DC

## 2011-04-13 MED ORDER — SODIUM CHLORIDE 0.9 % IJ SOLN: 3.0000 mL | INTRAMUSCULAR | Status: DC | PRN

## 2011-04-13 MED ORDER — SODIUM CHLORIDE 0.9 % IV SOLN
250.0000 mL | INTRAVENOUS | Status: DC
Start: 2011-04-13 — End: 2011-04-13
  Administered 2011-04-13: 11:00:00 via INTRAVENOUS

## 2011-04-13 MED ORDER — HYDROCORTISONE 1 % EX CREA
1.0000 "application " | TOPICAL_CREAM | Freq: Three times a day (TID) | CUTANEOUS | Status: DC | PRN
  Filled 2011-04-13: qty 28

## 2011-04-13 MED ORDER — PROPOFOL 10 MG/ML IV BOLUS
INTRAVENOUS | Status: DC | PRN
  Administered 2011-04-13: 100 mg via INTRAVENOUS

## 2011-04-13 NOTE — Anesthesia Postprocedure Evaluation (Signed)
  Anesthesia Post-op Note  Patient: Patty [name]  Procedure(s) Performed: Procedure(s) (LRB): CARDIOVERSION (N/A)  Patient Location: Short Stay  Anesthesia Type: MAC  Level of Consciousness: awake, alert  and oriented  Airway and Oxygen Therapy: Patient Spontanous Breathing  Post-op Pain: none  Post-op Assessment: Post-op Vital signs reviewed, Patient's Cardiovascular Status Stable, Respiratory Function Stable, Patent Airway and No signs of Nausea or vomiting  Post-op Vital Signs: Reviewed and stable  Complications: No apparent anesthesia complications

## 2011-04-13 NOTE — Anesthesia Procedure Notes (Signed)
Procedure Name: MAC Date/Time: 04/13/2011 11:30 AM Performed by: Delbert Harness Pre-anesthesia Checklist: Patient identified, Emergency Drugs available, Suction available, Patient being monitored and Timeout performed Patient Re-evaluated:Patient Re-evaluated prior to inductionOxygen Delivery Method: Ambu Bag Preoxygenation: Pre-oxygenation with 100% oxygen Intubation Type: IV induction Ventilation: Mask ventilation without difficulty Dental Injury: Teeth and Oropharynx as per pre-operative assessment

## 2011-04-13 NOTE — Transfer of Care (Signed)
Immediate Anesthesia Transfer of Care Note  Patient: Patty [name]  Procedure(s) Performed: Procedure(s) (LRB): CARDIOVERSION (N/A)  Patient Location: Short Stay  Anesthesia Type: MAC  Level of Consciousness: awake, alert  and oriented  Airway & Oxygen Therapy: Patient Spontanous Breathing and Patient connected to nasal cannula oxygen  Post-op Assessment: Report given to PACU RN and Post -op Vital signs reviewed and stable  Post vital signs: Reviewed and stable  Complications: No apparent anesthesia complications

## 2011-04-13 NOTE — Discharge Instructions (Signed)
Instructions Following General Anesthetic, Adult A nurse specialized in giving anesthesia (anesthetist) or a doctor specialized in giving anesthesia (anesthesiologist) gave you a medicine that made you sleep while a procedure was performed. For as long as 24 hours following this procedure, you may feel:  Dizzy.   Weak.   Drowsy.  AFTER THE PROCEDURE After surgery, you will be taken to the recovery area where a nurse will monitor your progress. You will be allowed to go home when you are awake, stable, taking fluids well, and without complications. For the first 24 hours following an anesthetic:  Have a responsible person with you.   Do not drive a car. If you are alone, do not take public transportation.   Do not drink alcohol.   Do not take medicine that has not been prescribed by your caregiver.   Do not sign important papers or make important decisions.   You may resume normal diet and activities as directed.   Change bandages (dressings) as directed.   Only take over-the-counter or prescription medicines for pain, discomfort, or fever as directed by your caregiver.  If you have questions or problems that seem related to the anesthetic, call the hospital and ask for the anesthetist or anesthesiologist on call. SEEK IMMEDIATE MEDICAL CARE IF:   You develop a rash.   You have difficulty breathing.   You have chest pain.   You develop any allergic problems.  Document Released: 05/24/2000 Document Revised: 10/28/2010 Document Reviewed: 01/02/2007 [employer] Patient Information 2012 [employer]. 

## 2011-04-13 NOTE — Interval H&P Note (Signed)
History and Physical Interval Note:  04/13/2011 11:38 AM  Patty [name]  has presented today for surgery, with the diagnosis of a-fib  The various methods of treatment have been discussed with the patient and family. After consideration of risks, benefits and other options for treatment, the patient has consented to  Procedure(s) (LRB): CARDIOVERSION (N/A) as a surgical intervention .  The patients' history has been reviewed, patient examined, no change in status, stable for surgery.  I have reviewed the patients' chart and labs.  Questions were answered to the patient's satisfaction.     [name],JAGADEESH R  No change in HPI. Patient set up for elective cardioversion

## 2011-04-13 NOTE — Anesthesia Preprocedure Evaluation (Signed)
Anesthesia Evaluation  Patient identified by MRN, date of birth, ID band Patient awake  General Assessment Comment:1986 After back surgery patient went into respiratory arrest and remained intubated in recovery room. No ICU stay required, extubated in PACU. No problems with anesthesia since  Reviewed: Allergy & Precautions, H&P , NPO status , Patient's Chart, lab work & pertinent test results  Airway Mallampati: III TM Distance: >3 FB Neck ROM: Full    Dental  (+) Teeth Intact, Dental Advisory Given and Caps   Pulmonary shortness of breath and with exertion, asthma , sleep apnea and Continuous Positive Airway Pressure Ventilation ,  clear to auscultation  Pulmonary exam normal       Cardiovascular hypertension, Pt. on medications + CAD and + DOE + dysrhythmias Atrial Fibrillation Irregular Normal    Neuro/Psych Negative Neurological ROS  Negative Psych ROS   GI/Hepatic negative GI ROS, Neg liver ROS,   Endo/Other  Diabetes mellitus-, Well Controlled, Type 2, Oral Hypoglycemic AgentsHypothyroidism   Renal/GU negative Renal ROS     Musculoskeletal negative musculoskeletal ROS (+) Arthritis -, Osteoarthritis,    Abdominal   Peds  Hematology negative hematology ROS (+)   Anesthesia Other Findings   Reproductive/Obstetrics                           Anesthesia Physical Anesthesia Plan  ASA: III  Anesthesia Plan: MAC   Post-op Pain Management:    Induction: Intravenous  Airway Management Planned: Mask  Additional Equipment:   Intra-op Plan:   Post-operative Plan:   Informed Consent: I have reviewed the patients History and Physical, chart, labs and discussed the procedure including the risks, benefits and alternatives for the proposed anesthesia with the patient or authorized representative who has indicated his/her understanding and acceptance.   Dental advisory given  Plan Discussed  with: Anesthesiologist and Surgeon  Anesthesia Plan Comments:         Anesthesia Quick Evaluation

## 2011-04-13 NOTE — Op Note (Signed)
Direct current cardioversion: Indication symptomatic A. Fibrillation/flutter.. Procedure: Using 100 mg of IV Propofol for achieving deep (Moderate sedation), synchronized direct current cardioversion performed. Patient was delivered with 74 Joules of electricity with success from A. Flutter to to SR with first degree AV block.. Patient tolerated the procedure well. No immediate complication noted.

## 2011-04-13 NOTE — Preoperative (Signed)
Beta Blockers   Reason not to administer Beta Blockers:Not Applicable 

## 2011-04-14 ENCOUNTER — Encounter (HOSPITAL_COMMUNITY): Payer: Self-pay | Admitting: Cardiology

## 2011-04-15 ENCOUNTER — Ambulatory Visit: Payer: Medicare Other | Admitting: Physical Therapy

## 2011-04-15 DIAGNOSIS — R5381 Other malaise: Secondary | ICD-10-CM | POA: Diagnosis not present

## 2011-04-15 DIAGNOSIS — IMO0001 Reserved for inherently not codable concepts without codable children: Secondary | ICD-10-CM | POA: Diagnosis not present

## 2011-04-15 DIAGNOSIS — M545 Low back pain: Secondary | ICD-10-CM | POA: Diagnosis not present

## 2011-04-19 ENCOUNTER — Encounter: Payer: Medicare Other | Admitting: Physical Therapy

## 2011-04-19 DIAGNOSIS — Z85828 Personal history of other malignant neoplasm of skin: Secondary | ICD-10-CM | POA: Diagnosis not present

## 2011-04-20 ENCOUNTER — Ambulatory Visit: Payer: Medicare Other | Admitting: Physical Therapy

## 2011-04-20 DIAGNOSIS — IMO0001 Reserved for inherently not codable concepts without codable children: Secondary | ICD-10-CM | POA: Diagnosis not present

## 2011-04-20 DIAGNOSIS — I4892 Unspecified atrial flutter: Secondary | ICD-10-CM | POA: Diagnosis not present

## 2011-04-20 DIAGNOSIS — R0609 Other forms of dyspnea: Secondary | ICD-10-CM | POA: Diagnosis not present

## 2011-04-20 DIAGNOSIS — R5381 Other malaise: Secondary | ICD-10-CM | POA: Diagnosis not present

## 2011-04-20 DIAGNOSIS — E1169 Type 2 diabetes mellitus with other specified complication: Secondary | ICD-10-CM | POA: Diagnosis not present

## 2011-04-20 DIAGNOSIS — I10 Essential (primary) hypertension: Secondary | ICD-10-CM | POA: Diagnosis not present

## 2011-04-20 DIAGNOSIS — R0989 Other specified symptoms and signs involving the circulatory and respiratory systems: Secondary | ICD-10-CM | POA: Diagnosis not present

## 2011-04-20 DIAGNOSIS — M545 Low back pain: Secondary | ICD-10-CM | POA: Diagnosis not present

## 2011-04-21 DIAGNOSIS — R339 Retention of urine, unspecified: Secondary | ICD-10-CM | POA: Diagnosis not present

## 2011-04-21 DIAGNOSIS — N3020 Other chronic cystitis without hematuria: Secondary | ICD-10-CM | POA: Diagnosis not present

## 2011-04-22 ENCOUNTER — Ambulatory Visit: Payer: Medicare Other | Admitting: Physical Therapy

## 2011-04-22 DIAGNOSIS — R5381 Other malaise: Secondary | ICD-10-CM | POA: Diagnosis not present

## 2011-04-22 DIAGNOSIS — IMO0001 Reserved for inherently not codable concepts without codable children: Secondary | ICD-10-CM | POA: Diagnosis not present

## 2011-04-22 DIAGNOSIS — M545 Low back pain: Secondary | ICD-10-CM | POA: Diagnosis not present

## 2011-04-26 ENCOUNTER — Ambulatory Visit: Payer: Medicare Other | Admitting: Physical Therapy

## 2011-04-26 DIAGNOSIS — IMO0001 Reserved for inherently not codable concepts without codable children: Secondary | ICD-10-CM | POA: Diagnosis not present

## 2011-04-26 DIAGNOSIS — M545 Low back pain: Secondary | ICD-10-CM | POA: Diagnosis not present

## 2011-04-26 DIAGNOSIS — R5381 Other malaise: Secondary | ICD-10-CM | POA: Diagnosis not present

## 2011-04-29 ENCOUNTER — Encounter: Payer: Self-pay | Admitting: Cardiology

## 2011-04-29 DIAGNOSIS — R0989 Other specified symptoms and signs involving the circulatory and respiratory systems: Secondary | ICD-10-CM | POA: Diagnosis not present

## 2011-04-29 DIAGNOSIS — I4892 Unspecified atrial flutter: Secondary | ICD-10-CM | POA: Diagnosis not present

## 2011-04-29 DIAGNOSIS — R0609 Other forms of dyspnea: Secondary | ICD-10-CM | POA: Diagnosis not present

## 2011-04-29 DIAGNOSIS — I10 Essential (primary) hypertension: Secondary | ICD-10-CM | POA: Diagnosis not present

## 2011-04-29 DIAGNOSIS — I4891 Unspecified atrial fibrillation: Secondary | ICD-10-CM | POA: Diagnosis not present

## 2011-04-29 DIAGNOSIS — Z7901 Long term (current) use of anticoagulants: Secondary | ICD-10-CM | POA: Diagnosis not present

## 2011-05-03 DIAGNOSIS — E039 Hypothyroidism, unspecified: Secondary | ICD-10-CM | POA: Diagnosis not present

## 2011-05-05 DIAGNOSIS — E039 Hypothyroidism, unspecified: Secondary | ICD-10-CM | POA: Diagnosis not present

## 2011-05-05 DIAGNOSIS — E119 Type 2 diabetes mellitus without complications: Secondary | ICD-10-CM | POA: Diagnosis not present

## 2011-05-18 DIAGNOSIS — I10 Essential (primary) hypertension: Secondary | ICD-10-CM | POA: Diagnosis not present

## 2011-05-18 DIAGNOSIS — I4891 Unspecified atrial fibrillation: Secondary | ICD-10-CM | POA: Diagnosis not present

## 2011-05-18 DIAGNOSIS — Z7901 Long term (current) use of anticoagulants: Secondary | ICD-10-CM | POA: Diagnosis not present

## 2011-05-18 DIAGNOSIS — I4892 Unspecified atrial flutter: Secondary | ICD-10-CM | POA: Diagnosis not present

## 2011-05-18 DIAGNOSIS — R0989 Other specified symptoms and signs involving the circulatory and respiratory systems: Secondary | ICD-10-CM | POA: Diagnosis not present

## 2011-05-19 DIAGNOSIS — M5430 Sciatica, unspecified side: Secondary | ICD-10-CM | POA: Diagnosis not present

## 2011-05-19 DIAGNOSIS — M48061 Spinal stenosis, lumbar region without neurogenic claudication: Secondary | ICD-10-CM | POA: Diagnosis not present

## 2011-05-19 DIAGNOSIS — M4310 Spondylolisthesis, site unspecified: Secondary | ICD-10-CM | POA: Diagnosis not present

## 2011-05-20 ENCOUNTER — Other Ambulatory Visit: Payer: Self-pay | Admitting: *Deleted

## 2011-05-20 MED ORDER — POTASSIUM CHLORIDE CRYS ER 20 MEQ PO TBCR: 20.0000 meq | EXTENDED_RELEASE_TABLET | Freq: Every day | ORAL | Status: DC

## 2011-05-28 ENCOUNTER — Encounter: Payer: Self-pay | Admitting: Cardiology

## 2011-06-07 ENCOUNTER — Other Ambulatory Visit: Payer: Self-pay | Admitting: *Deleted

## 2011-06-07 MED ORDER — DIGOXIN 125 MCG PO TABS: 125.0000 ug | ORAL_TABLET | Freq: Every day | ORAL | Status: DC

## 2011-06-07 MED ORDER — DILTIAZEM HCL ER COATED BEADS 120 MG PO CP24: 120.0000 mg | ORAL_CAPSULE | Freq: Every day | ORAL | Status: DC

## 2011-06-08 DIAGNOSIS — R339 Retention of urine, unspecified: Secondary | ICD-10-CM | POA: Diagnosis not present

## 2011-06-08 DIAGNOSIS — N3020 Other chronic cystitis without hematuria: Secondary | ICD-10-CM | POA: Diagnosis not present

## 2011-06-14 DIAGNOSIS — I4891 Unspecified atrial fibrillation: Secondary | ICD-10-CM | POA: Diagnosis not present

## 2011-06-14 DIAGNOSIS — Z7901 Long term (current) use of anticoagulants: Secondary | ICD-10-CM | POA: Diagnosis not present

## 2011-06-21 ENCOUNTER — Other Ambulatory Visit: Payer: Self-pay | Admitting: Obstetrics and Gynecology

## 2011-06-22 ENCOUNTER — Telehealth: Payer: Self-pay | Admitting: *Deleted

## 2011-06-22 DIAGNOSIS — N3020 Other chronic cystitis without hematuria: Secondary | ICD-10-CM | POA: Diagnosis not present

## 2011-06-22 MED ORDER — ESTROGENS, CONJUGATED 0.625 MG/GM VA CREA: TOPICAL_CREAM | VAGINAL | Status: DC

## 2011-06-22 NOTE — Telephone Encounter (Signed)
Pt called stating her copay on her estrace vaginal cream has increased to $120, pharmacy told pt about premarin 3 month supply $80 supply. Pt would like to know if she could switch? Please advise

## 2011-06-22 NOTE — Telephone Encounter (Signed)
Pt informed with the below note, rx sent. 

## 2011-06-22 NOTE — Telephone Encounter (Signed)
Premarin vaginal cream 0.5 g dose in vagina 3 times a week.

## 2011-07-12 DIAGNOSIS — I4891 Unspecified atrial fibrillation: Secondary | ICD-10-CM | POA: Diagnosis not present

## 2011-07-12 DIAGNOSIS — Z7901 Long term (current) use of anticoagulants: Secondary | ICD-10-CM | POA: Diagnosis not present

## 2011-07-12 DIAGNOSIS — R0609 Other forms of dyspnea: Secondary | ICD-10-CM | POA: Diagnosis not present

## 2011-07-21 DIAGNOSIS — E119 Type 2 diabetes mellitus without complications: Secondary | ICD-10-CM | POA: Diagnosis not present

## 2011-07-27 DIAGNOSIS — E7800 Pure hypercholesterolemia, unspecified: Secondary | ICD-10-CM | POA: Diagnosis not present

## 2011-07-27 DIAGNOSIS — I4891 Unspecified atrial fibrillation: Secondary | ICD-10-CM | POA: Diagnosis not present

## 2011-07-27 DIAGNOSIS — E119 Type 2 diabetes mellitus without complications: Secondary | ICD-10-CM | POA: Diagnosis not present

## 2011-07-27 DIAGNOSIS — Z7901 Long term (current) use of anticoagulants: Secondary | ICD-10-CM | POA: Diagnosis not present

## 2011-07-27 DIAGNOSIS — R0989 Other specified symptoms and signs involving the circulatory and respiratory systems: Secondary | ICD-10-CM | POA: Diagnosis not present

## 2011-07-27 DIAGNOSIS — E1149 Type 2 diabetes mellitus with other diabetic neurological complication: Secondary | ICD-10-CM | POA: Diagnosis not present

## 2011-07-27 DIAGNOSIS — R0609 Other forms of dyspnea: Secondary | ICD-10-CM | POA: Diagnosis not present

## 2011-07-27 DIAGNOSIS — I10 Essential (primary) hypertension: Secondary | ICD-10-CM | POA: Diagnosis not present

## 2011-08-04 DIAGNOSIS — Z5181 Encounter for therapeutic drug level monitoring: Secondary | ICD-10-CM | POA: Diagnosis not present

## 2011-08-04 DIAGNOSIS — E559 Vitamin D deficiency, unspecified: Secondary | ICD-10-CM | POA: Diagnosis not present

## 2011-08-04 DIAGNOSIS — G2581 Restless legs syndrome: Secondary | ICD-10-CM | POA: Diagnosis not present

## 2011-08-04 DIAGNOSIS — Z79899 Other long term (current) drug therapy: Secondary | ICD-10-CM | POA: Diagnosis not present

## 2011-08-04 DIAGNOSIS — M79609 Pain in unspecified limb: Secondary | ICD-10-CM | POA: Diagnosis not present

## 2011-08-04 DIAGNOSIS — E1149 Type 2 diabetes mellitus with other diabetic neurological complication: Secondary | ICD-10-CM | POA: Diagnosis not present

## 2011-08-04 DIAGNOSIS — J45909 Unspecified asthma, uncomplicated: Secondary | ICD-10-CM | POA: Diagnosis not present

## 2011-08-04 DIAGNOSIS — Z0000 Encounter for general adult medical examination without abnormal findings: Secondary | ICD-10-CM | POA: Diagnosis not present

## 2011-08-04 DIAGNOSIS — N390 Urinary tract infection, site not specified: Secondary | ICD-10-CM | POA: Diagnosis not present

## 2011-08-16 ENCOUNTER — Encounter: Payer: Self-pay | Admitting: Obstetrics and Gynecology

## 2011-08-16 ENCOUNTER — Ambulatory Visit (INDEPENDENT_AMBULATORY_CARE_PROVIDER_SITE_OTHER): Payer: Medicare Other | Admitting: Obstetrics and Gynecology

## 2011-08-16 DIAGNOSIS — N63 Unspecified lump in unspecified breast: Secondary | ICD-10-CM

## 2011-08-16 NOTE — Progress Notes (Signed)
Patient came to see me today with a history of feeling something in her left breast for one month. She says some days she feels it and some days she does not. She had her last mammogram last fall.  Exam: Beola Cord present. Right breast carefully examined in both the sitting and lying  position and is without masses or skin changes. Left breasts examined in both the sitting and lying position. There are no skin changes. At 2-3:00 there is some thickening of the breast tissue without a discrete dominant lesion.  Assessment: Possible new breast mass  Plan: Diagnostic mammogram of left breast.

## 2011-08-17 ENCOUNTER — Telehealth: Payer: Self-pay | Admitting: *Deleted

## 2011-08-17 ENCOUNTER — Other Ambulatory Visit: Payer: Self-pay | Admitting: *Deleted

## 2011-08-17 DIAGNOSIS — N63 Unspecified lump in unspecified breast: Secondary | ICD-10-CM

## 2011-08-17 NOTE — Telephone Encounter (Signed)
Called and left message for pt to call and set up time for an  appt  to have INR checked as she has not been seen since January 15th or to let us know if her coumadin is being managed by another office

## 2011-08-17 NOTE — Telephone Encounter (Signed)
Message copied by Mckinley Jewel, Erza Mothershead L on Tue Aug 17, 2011 10:14 AM ------      Message from: Trellis Paganini      Created: Mon Aug 16, 2011  4:04 PM       Patient has possible mass and left breast at 2-3:00. Please schedule diagnostic mammogram possible ultrasound of left breast at [facility]

## 2011-08-17 NOTE — Telephone Encounter (Signed)
Patient informed appt set up at [facility] on 08/24/11 @ 8:40am.

## 2011-08-18 DIAGNOSIS — M48061 Spinal stenosis, lumbar region without neurogenic claudication: Secondary | ICD-10-CM | POA: Diagnosis not present

## 2011-08-18 DIAGNOSIS — M4310 Spondylolisthesis, site unspecified: Secondary | ICD-10-CM | POA: Diagnosis not present

## 2011-08-18 DIAGNOSIS — M5430 Sciatica, unspecified side: Secondary | ICD-10-CM | POA: Diagnosis not present

## 2011-08-24 ENCOUNTER — Other Ambulatory Visit: Payer: Medicare Other

## 2011-08-24 DIAGNOSIS — I4891 Unspecified atrial fibrillation: Secondary | ICD-10-CM | POA: Diagnosis not present

## 2011-08-24 DIAGNOSIS — Z7901 Long term (current) use of anticoagulants: Secondary | ICD-10-CM | POA: Diagnosis not present

## 2011-08-25 ENCOUNTER — Ambulatory Visit
Admission: RE | Admit: 2011-08-25 | Discharge: 2011-08-25 | Disposition: A | Discharge status: Home / Self Care | Payer: Medicare Other | Source: Ambulatory Visit | Attending: Obstetrics and Gynecology | Admitting: Obstetrics and Gynecology

## 2011-08-25 DIAGNOSIS — N63 Unspecified lump in unspecified breast: Secondary | ICD-10-CM

## 2011-08-25 DIAGNOSIS — N6459 Other signs and symptoms in breast: Secondary | ICD-10-CM | POA: Diagnosis not present

## 2011-09-01 DIAGNOSIS — G2581 Restless legs syndrome: Secondary | ICD-10-CM | POA: Diagnosis not present

## 2011-09-23 DIAGNOSIS — N3020 Other chronic cystitis without hematuria: Secondary | ICD-10-CM | POA: Diagnosis not present

## 2011-09-23 DIAGNOSIS — I4892 Unspecified atrial flutter: Secondary | ICD-10-CM | POA: Diagnosis not present

## 2011-09-23 DIAGNOSIS — I4891 Unspecified atrial fibrillation: Secondary | ICD-10-CM | POA: Diagnosis not present

## 2011-09-23 DIAGNOSIS — Z7901 Long term (current) use of anticoagulants: Secondary | ICD-10-CM | POA: Diagnosis not present

## 2011-09-23 DIAGNOSIS — R339 Retention of urine, unspecified: Secondary | ICD-10-CM | POA: Diagnosis not present

## 2011-10-20 ENCOUNTER — Encounter: Payer: Self-pay | Admitting: Pharmacist

## 2011-10-25 DIAGNOSIS — I4891 Unspecified atrial fibrillation: Secondary | ICD-10-CM | POA: Diagnosis not present

## 2011-10-25 DIAGNOSIS — E7800 Pure hypercholesterolemia, unspecified: Secondary | ICD-10-CM | POA: Diagnosis not present

## 2011-10-25 DIAGNOSIS — E119 Type 2 diabetes mellitus without complications: Secondary | ICD-10-CM | POA: Diagnosis not present

## 2011-10-25 DIAGNOSIS — Z7901 Long term (current) use of anticoagulants: Secondary | ICD-10-CM | POA: Diagnosis not present

## 2011-10-27 DIAGNOSIS — N3020 Other chronic cystitis without hematuria: Secondary | ICD-10-CM | POA: Diagnosis not present

## 2011-10-28 DIAGNOSIS — E1149 Type 2 diabetes mellitus with other diabetic neurological complication: Secondary | ICD-10-CM | POA: Diagnosis not present

## 2011-10-28 DIAGNOSIS — I10 Essential (primary) hypertension: Secondary | ICD-10-CM | POA: Diagnosis not present

## 2011-10-28 DIAGNOSIS — E7800 Pure hypercholesterolemia, unspecified: Secondary | ICD-10-CM | POA: Diagnosis not present

## 2011-11-18 DIAGNOSIS — I4891 Unspecified atrial fibrillation: Secondary | ICD-10-CM | POA: Diagnosis not present

## 2011-11-18 DIAGNOSIS — Z7901 Long term (current) use of anticoagulants: Secondary | ICD-10-CM | POA: Diagnosis not present

## 2011-11-18 DIAGNOSIS — I4892 Unspecified atrial flutter: Secondary | ICD-10-CM | POA: Diagnosis not present

## 2011-11-18 DIAGNOSIS — R0609 Other forms of dyspnea: Secondary | ICD-10-CM | POA: Diagnosis not present

## 2011-11-18 DIAGNOSIS — I10 Essential (primary) hypertension: Secondary | ICD-10-CM | POA: Diagnosis not present

## 2011-12-02 DIAGNOSIS — M719 Bursopathy, unspecified: Secondary | ICD-10-CM | POA: Diagnosis not present

## 2011-12-02 DIAGNOSIS — M67919 Unspecified disorder of synovium and tendon, unspecified shoulder: Secondary | ICD-10-CM | POA: Diagnosis not present

## 2011-12-06 DIAGNOSIS — E1149 Type 2 diabetes mellitus with other diabetic neurological complication: Secondary | ICD-10-CM | POA: Diagnosis not present

## 2011-12-06 DIAGNOSIS — E7800 Pure hypercholesterolemia, unspecified: Secondary | ICD-10-CM | POA: Diagnosis not present

## 2011-12-09 DIAGNOSIS — E1149 Type 2 diabetes mellitus with other diabetic neurological complication: Secondary | ICD-10-CM | POA: Diagnosis not present

## 2011-12-09 DIAGNOSIS — I10 Essential (primary) hypertension: Secondary | ICD-10-CM | POA: Diagnosis not present

## 2011-12-09 DIAGNOSIS — E7800 Pure hypercholesterolemia, unspecified: Secondary | ICD-10-CM | POA: Diagnosis not present

## 2011-12-09 DIAGNOSIS — IMO0001 Reserved for inherently not codable concepts without codable children: Secondary | ICD-10-CM | POA: Diagnosis not present

## 2011-12-09 DIAGNOSIS — Z23 Encounter for immunization: Secondary | ICD-10-CM | POA: Diagnosis not present

## 2011-12-13 DIAGNOSIS — M5030 Other cervical disc degeneration, unspecified cervical region: Secondary | ICD-10-CM | POA: Diagnosis not present

## 2011-12-13 DIAGNOSIS — M67919 Unspecified disorder of synovium and tendon, unspecified shoulder: Secondary | ICD-10-CM | POA: Diagnosis not present

## 2011-12-15 ENCOUNTER — Ambulatory Visit (INDEPENDENT_AMBULATORY_CARE_PROVIDER_SITE_OTHER): Payer: Medicare Other | Admitting: Obstetrics and Gynecology

## 2011-12-15 ENCOUNTER — Encounter: Payer: Self-pay | Admitting: Obstetrics and Gynecology

## 2011-12-15 VITALS — BP 146/90 | Ht 66.0 in | Wt 143.0 lb

## 2011-12-15 DIAGNOSIS — N390 Urinary tract infection, site not specified: Secondary | ICD-10-CM

## 2011-12-15 DIAGNOSIS — N63 Unspecified lump in unspecified breast: Secondary | ICD-10-CM | POA: Diagnosis not present

## 2011-12-15 DIAGNOSIS — N952 Postmenopausal atrophic vaginitis: Secondary | ICD-10-CM

## 2011-12-15 DIAGNOSIS — N898 Other specified noninflammatory disorders of vagina: Secondary | ICD-10-CM

## 2011-12-15 DIAGNOSIS — Z7901 Long term (current) use of anticoagulants: Secondary | ICD-10-CM | POA: Diagnosis not present

## 2011-12-15 DIAGNOSIS — B373 Candidiasis of vulva and vagina: Secondary | ICD-10-CM | POA: Diagnosis not present

## 2011-12-15 DIAGNOSIS — Z780 Asymptomatic menopausal state: Secondary | ICD-10-CM

## 2011-12-15 DIAGNOSIS — L293 Anogenital pruritus, unspecified: Secondary | ICD-10-CM | POA: Diagnosis not present

## 2011-12-15 LAB — WET PREP FOR TRICH, YEAST, CLUE

## 2011-12-15 MED ORDER — FLUCONAZOLE 200 MG PO TABS: 200.0000 mg | ORAL_TABLET | Freq: Every day | ORAL | Status: DC

## 2011-12-15 NOTE — Patient Instructions (Signed)
Schedule mammogram.

## 2011-12-15 NOTE — Progress Notes (Signed)
Patient came to see me today for further followup. This summer she felt something in her breast and came to see me. Diagnostic mammogram and ultrasound was done which were normal. The mass In her Left breast Went away. She has been on antibiotics and is now having vaginal itching. She tried Monistat without success. She also has change soaps recently. She is having no vaginal bleeding. She is having no pelvic pain. She has always had normal Pap smears. Her last Pap smear was 2010. She does her bone densities through PCP and they are normal. She does have some hot flashes and vaginal dryness but they do  not require treatment. She had a total abdominal hysterectomy in 1972 for fibroids. She had a pubovaginal repair with sling in 1998.  ROS: 12 system review done. Pertinent positives above.Other positives include hypothyroidism, asthma, osteoarthritis, atrial fibrillation, hyperlipidemia, need for anticoagulation, and coronary artery disease.She is also diabetic.  HEENT: Within normal limits.Kim gardner present. Neck: No masses. Supraclavicular lymph nodes: Not enlarged. Breasts: Examined in both sitting and lying position. Symmetrical without skin changes or masses. Abdomen: Soft no masses guarding or rebound. No hernias. Pelvic: External within normal limits. BUS within normal limits. Vaginal examination shows poor estrogen effect, no cystocele enterocele or rectocele. Wet prep positive for yeast. Cervix and uterus absent. Adnexa within normal limits. Rectovaginal confirmatory. Extremities within normal limits.  Assessment: #1. Yeast vaginitis #2.  hot flashes #3. Recurrent urinary tract infections #4. Atrophic vaginitis #5. Breast mass-now resolved.  Plan: Diflucan 200 mg daily for 4 days. Continue yearly mammograms-she is now due since last one was just left breast. Pap not done.The new Pap smear guidelines were discussed with the patient. Urinalysis checked because of a history of recurrent  urinary tract infections. Please note she is currently asymptomatic.

## 2011-12-22 ENCOUNTER — Other Ambulatory Visit: Payer: Self-pay

## 2011-12-22 MED ORDER — POTASSIUM CHLORIDE CRYS ER 20 MEQ PO TBCR: 20.0000 meq | EXTENDED_RELEASE_TABLET | Freq: Every day | ORAL | Status: DC

## 2012-01-13 ENCOUNTER — Other Ambulatory Visit: Payer: Self-pay | Admitting: Obstetrics and Gynecology

## 2012-01-13 DIAGNOSIS — Z1231 Encounter for screening mammogram for malignant neoplasm of breast: Secondary | ICD-10-CM

## 2012-01-17 DIAGNOSIS — Z7901 Long term (current) use of anticoagulants: Secondary | ICD-10-CM | POA: Diagnosis not present

## 2012-01-31 DIAGNOSIS — N952 Postmenopausal atrophic vaginitis: Secondary | ICD-10-CM | POA: Diagnosis not present

## 2012-01-31 DIAGNOSIS — N3020 Other chronic cystitis without hematuria: Secondary | ICD-10-CM | POA: Diagnosis not present

## 2012-02-04 DIAGNOSIS — E119 Type 2 diabetes mellitus without complications: Secondary | ICD-10-CM | POA: Diagnosis not present

## 2012-02-15 DIAGNOSIS — Z7901 Long term (current) use of anticoagulants: Secondary | ICD-10-CM | POA: Diagnosis not present

## 2012-03-06 DIAGNOSIS — E1149 Type 2 diabetes mellitus with other diabetic neurological complication: Secondary | ICD-10-CM | POA: Diagnosis not present

## 2012-03-06 DIAGNOSIS — E7800 Pure hypercholesterolemia, unspecified: Secondary | ICD-10-CM | POA: Diagnosis not present

## 2012-03-07 ENCOUNTER — Ambulatory Visit
Admission: RE | Admit: 2012-03-07 | Discharge: 2012-03-07 | Disposition: A | Discharge status: Home / Self Care | Payer: Medicare Other | Source: Ambulatory Visit | Attending: Obstetrics and Gynecology | Admitting: Obstetrics and Gynecology

## 2012-03-07 DIAGNOSIS — Z1231 Encounter for screening mammogram for malignant neoplasm of breast: Secondary | ICD-10-CM | POA: Diagnosis not present

## 2012-03-09 DIAGNOSIS — E7800 Pure hypercholesterolemia, unspecified: Secondary | ICD-10-CM | POA: Diagnosis not present

## 2012-03-09 DIAGNOSIS — I10 Essential (primary) hypertension: Secondary | ICD-10-CM | POA: Diagnosis not present

## 2012-03-09 DIAGNOSIS — E1149 Type 2 diabetes mellitus with other diabetic neurological complication: Secondary | ICD-10-CM | POA: Diagnosis not present

## 2012-03-14 DIAGNOSIS — Z7901 Long term (current) use of anticoagulants: Secondary | ICD-10-CM | POA: Diagnosis not present

## 2012-03-28 DIAGNOSIS — E039 Hypothyroidism, unspecified: Secondary | ICD-10-CM | POA: Diagnosis not present

## 2012-04-11 DIAGNOSIS — Z7901 Long term (current) use of anticoagulants: Secondary | ICD-10-CM | POA: Diagnosis not present

## 2012-05-18 DIAGNOSIS — I4891 Unspecified atrial fibrillation: Secondary | ICD-10-CM | POA: Diagnosis not present

## 2012-05-18 DIAGNOSIS — E7800 Pure hypercholesterolemia, unspecified: Secondary | ICD-10-CM | POA: Diagnosis not present

## 2012-05-22 DIAGNOSIS — E039 Hypothyroidism, unspecified: Secondary | ICD-10-CM | POA: Diagnosis not present

## 2012-06-07 DIAGNOSIS — E1149 Type 2 diabetes mellitus with other diabetic neurological complication: Secondary | ICD-10-CM | POA: Diagnosis not present

## 2012-06-07 DIAGNOSIS — E7800 Pure hypercholesterolemia, unspecified: Secondary | ICD-10-CM | POA: Diagnosis not present

## 2012-06-07 DIAGNOSIS — I4892 Unspecified atrial flutter: Secondary | ICD-10-CM | POA: Diagnosis not present

## 2012-06-07 DIAGNOSIS — I10 Essential (primary) hypertension: Secondary | ICD-10-CM | POA: Diagnosis not present

## 2012-06-12 DIAGNOSIS — Z7901 Long term (current) use of anticoagulants: Secondary | ICD-10-CM | POA: Diagnosis not present

## 2012-06-13 DIAGNOSIS — E1149 Type 2 diabetes mellitus with other diabetic neurological complication: Secondary | ICD-10-CM | POA: Diagnosis not present

## 2012-06-13 DIAGNOSIS — I10 Essential (primary) hypertension: Secondary | ICD-10-CM | POA: Diagnosis not present

## 2012-06-13 DIAGNOSIS — E7800 Pure hypercholesterolemia, unspecified: Secondary | ICD-10-CM | POA: Diagnosis not present

## 2012-07-10 DIAGNOSIS — E7800 Pure hypercholesterolemia, unspecified: Secondary | ICD-10-CM | POA: Diagnosis not present

## 2012-07-10 DIAGNOSIS — E1149 Type 2 diabetes mellitus with other diabetic neurological complication: Secondary | ICD-10-CM | POA: Diagnosis not present

## 2012-07-11 DIAGNOSIS — Z7901 Long term (current) use of anticoagulants: Secondary | ICD-10-CM | POA: Diagnosis not present

## 2012-07-13 DIAGNOSIS — E1149 Type 2 diabetes mellitus with other diabetic neurological complication: Secondary | ICD-10-CM | POA: Diagnosis not present

## 2012-07-13 DIAGNOSIS — E7800 Pure hypercholesterolemia, unspecified: Secondary | ICD-10-CM | POA: Diagnosis not present

## 2012-07-13 DIAGNOSIS — I10 Essential (primary) hypertension: Secondary | ICD-10-CM | POA: Diagnosis not present

## 2012-07-31 DIAGNOSIS — N3020 Other chronic cystitis without hematuria: Secondary | ICD-10-CM | POA: Diagnosis not present

## 2012-08-04 DIAGNOSIS — E559 Vitamin D deficiency, unspecified: Secondary | ICD-10-CM | POA: Diagnosis not present

## 2012-08-04 DIAGNOSIS — Z79899 Other long term (current) drug therapy: Secondary | ICD-10-CM | POA: Diagnosis not present

## 2012-08-04 DIAGNOSIS — Z006 Encounter for examination for normal comparison and control in clinical research program: Secondary | ICD-10-CM | POA: Diagnosis not present

## 2012-08-04 DIAGNOSIS — Z0000 Encounter for general adult medical examination without abnormal findings: Secondary | ICD-10-CM | POA: Diagnosis not present

## 2012-08-04 DIAGNOSIS — E119 Type 2 diabetes mellitus without complications: Secondary | ICD-10-CM | POA: Diagnosis not present

## 2012-08-04 DIAGNOSIS — E1149 Type 2 diabetes mellitus with other diabetic neurological complication: Secondary | ICD-10-CM | POA: Diagnosis not present

## 2012-08-04 DIAGNOSIS — I10 Essential (primary) hypertension: Secondary | ICD-10-CM | POA: Diagnosis not present

## 2012-08-04 DIAGNOSIS — E7800 Pure hypercholesterolemia, unspecified: Secondary | ICD-10-CM | POA: Diagnosis not present

## 2012-08-04 DIAGNOSIS — E039 Hypothyroidism, unspecified: Secondary | ICD-10-CM | POA: Diagnosis not present

## 2012-08-09 DIAGNOSIS — I10 Essential (primary) hypertension: Secondary | ICD-10-CM | POA: Diagnosis not present

## 2012-08-09 DIAGNOSIS — E7800 Pure hypercholesterolemia, unspecified: Secondary | ICD-10-CM | POA: Diagnosis not present

## 2012-08-09 DIAGNOSIS — E119 Type 2 diabetes mellitus without complications: Secondary | ICD-10-CM | POA: Diagnosis not present

## 2012-08-09 DIAGNOSIS — G609 Hereditary and idiopathic neuropathy, unspecified: Secondary | ICD-10-CM | POA: Diagnosis not present

## 2012-08-15 DIAGNOSIS — Z7901 Long term (current) use of anticoagulants: Secondary | ICD-10-CM | POA: Diagnosis not present

## 2012-08-17 ENCOUNTER — Ambulatory Visit: Payer: Self-pay | Admitting: Cardiology

## 2012-08-17 DIAGNOSIS — M5430 Sciatica, unspecified side: Secondary | ICD-10-CM | POA: Diagnosis not present

## 2012-08-17 DIAGNOSIS — M48061 Spinal stenosis, lumbar region without neurogenic claudication: Secondary | ICD-10-CM | POA: Diagnosis not present

## 2012-08-17 DIAGNOSIS — M4310 Spondylolisthesis, site unspecified: Secondary | ICD-10-CM | POA: Diagnosis not present

## 2012-08-17 DIAGNOSIS — I4891 Unspecified atrial fibrillation: Secondary | ICD-10-CM

## 2012-09-06 DIAGNOSIS — R209 Unspecified disturbances of skin sensation: Secondary | ICD-10-CM | POA: Diagnosis not present

## 2012-09-06 DIAGNOSIS — N390 Urinary tract infection, site not specified: Secondary | ICD-10-CM | POA: Diagnosis not present

## 2012-09-06 DIAGNOSIS — R300 Dysuria: Secondary | ICD-10-CM | POA: Diagnosis not present

## 2012-09-06 DIAGNOSIS — R29898 Other symptoms and signs involving the musculoskeletal system: Secondary | ICD-10-CM | POA: Diagnosis not present

## 2012-09-11 DIAGNOSIS — Z7901 Long term (current) use of anticoagulants: Secondary | ICD-10-CM | POA: Diagnosis not present

## 2012-09-15 DIAGNOSIS — Z7901 Long term (current) use of anticoagulants: Secondary | ICD-10-CM | POA: Diagnosis not present

## 2012-09-19 ENCOUNTER — Other Ambulatory Visit: Payer: Self-pay | Admitting: Obstetrics and Gynecology

## 2012-09-19 NOTE — Telephone Encounter (Signed)
Former patient of Dr. Verl Dicker. Current with last CE being 12/15/11. At that visit he wrote "She does have some hot flashes and vaginal dryness but they do not require treatment."   However 06/21/12

## 2012-09-19 NOTE — Telephone Encounter (Signed)
Former patient of Dr. Verl Dicker. Current with last CE being 12/15/11.  At that visit he wrote "She does have some hot flashes and vaginal dryness but they do not require treatment".  However, 06/21/12 he prescribed for her.  Trellis Paganini, MD at 06/22/2011 2:30 PM   Status: Signed            Premarin vaginal cream 0.5 g dose in vagina 3 times a week

## 2012-09-29 DIAGNOSIS — E7800 Pure hypercholesterolemia, unspecified: Secondary | ICD-10-CM | POA: Diagnosis not present

## 2012-09-29 DIAGNOSIS — Z79899 Other long term (current) drug therapy: Secondary | ICD-10-CM | POA: Diagnosis not present

## 2012-09-29 DIAGNOSIS — E1149 Type 2 diabetes mellitus with other diabetic neurological complication: Secondary | ICD-10-CM | POA: Diagnosis not present

## 2012-10-12 DIAGNOSIS — G4730 Sleep apnea, unspecified: Secondary | ICD-10-CM | POA: Diagnosis not present

## 2012-10-17 DIAGNOSIS — Z7901 Long term (current) use of anticoagulants: Secondary | ICD-10-CM | POA: Diagnosis not present

## 2012-10-25 DIAGNOSIS — E1142 Type 2 diabetes mellitus with diabetic polyneuropathy: Secondary | ICD-10-CM | POA: Diagnosis not present

## 2012-10-25 DIAGNOSIS — M79609 Pain in unspecified limb: Secondary | ICD-10-CM | POA: Diagnosis not present

## 2012-11-08 DIAGNOSIS — I10 Essential (primary) hypertension: Secondary | ICD-10-CM | POA: Diagnosis not present

## 2012-11-08 DIAGNOSIS — E1149 Type 2 diabetes mellitus with other diabetic neurological complication: Secondary | ICD-10-CM | POA: Diagnosis not present

## 2012-11-08 DIAGNOSIS — Z23 Encounter for immunization: Secondary | ICD-10-CM | POA: Diagnosis not present

## 2012-11-16 DIAGNOSIS — Z7901 Long term (current) use of anticoagulants: Secondary | ICD-10-CM | POA: Diagnosis not present

## 2012-11-16 DIAGNOSIS — I10 Essential (primary) hypertension: Secondary | ICD-10-CM | POA: Diagnosis not present

## 2012-11-16 DIAGNOSIS — I440 Atrioventricular block, first degree: Secondary | ICD-10-CM | POA: Diagnosis not present

## 2012-11-16 DIAGNOSIS — I4891 Unspecified atrial fibrillation: Secondary | ICD-10-CM | POA: Diagnosis not present

## 2012-12-12 DIAGNOSIS — Z7901 Long term (current) use of anticoagulants: Secondary | ICD-10-CM | POA: Diagnosis not present

## 2012-12-20 ENCOUNTER — Encounter: Payer: Self-pay | Admitting: Gynecology

## 2012-12-22 DIAGNOSIS — M79609 Pain in unspecified limb: Secondary | ICD-10-CM | POA: Diagnosis not present

## 2012-12-22 DIAGNOSIS — E1142 Type 2 diabetes mellitus with diabetic polyneuropathy: Secondary | ICD-10-CM | POA: Diagnosis not present

## 2012-12-25 DIAGNOSIS — M79609 Pain in unspecified limb: Secondary | ICD-10-CM | POA: Diagnosis not present

## 2012-12-25 DIAGNOSIS — M545 Low back pain: Secondary | ICD-10-CM | POA: Diagnosis not present

## 2012-12-26 DIAGNOSIS — Z7901 Long term (current) use of anticoagulants: Secondary | ICD-10-CM | POA: Diagnosis not present

## 2012-12-26 DIAGNOSIS — M79609 Pain in unspecified limb: Secondary | ICD-10-CM | POA: Diagnosis not present

## 2012-12-29 ENCOUNTER — Encounter: Payer: Self-pay | Admitting: Gynecology

## 2012-12-29 DIAGNOSIS — L570 Actinic keratosis: Secondary | ICD-10-CM | POA: Diagnosis not present

## 2012-12-29 DIAGNOSIS — D485 Neoplasm of uncertain behavior of skin: Secondary | ICD-10-CM | POA: Diagnosis not present

## 2013-01-08 ENCOUNTER — Ambulatory Visit (INDEPENDENT_AMBULATORY_CARE_PROVIDER_SITE_OTHER): Payer: Medicare Other | Admitting: Gynecology

## 2013-01-08 ENCOUNTER — Encounter: Payer: Self-pay | Admitting: Gynecology

## 2013-01-08 VITALS — BP 130/70 | Ht 66.0 in | Wt 139.0 lb

## 2013-01-08 DIAGNOSIS — N816 Rectocele: Secondary | ICD-10-CM

## 2013-01-08 DIAGNOSIS — N390 Urinary tract infection, site not specified: Secondary | ICD-10-CM | POA: Diagnosis not present

## 2013-01-08 DIAGNOSIS — Z86010 Personal history of colonic polyps: Secondary | ICD-10-CM

## 2013-01-08 DIAGNOSIS — N951 Menopausal and female climacteric states: Secondary | ICD-10-CM

## 2013-01-08 MED ORDER — ESTROGENS, CONJUGATED 0.625 MG/GM VA CREA: TOPICAL_CREAM | VAGINAL | Status: DC

## 2013-01-08 NOTE — Progress Notes (Signed)
Patty [name] [birth year] [id number]   History:    72 y.o.  for GYN followup. Patient has been followed by Dr. Rodena Piety urologist as a result of recurrent UTIs. Her vaginal dryness as well he has prescribed Premarin vaginal cream to apply 3 times a week she has a followup 1 within the next few weeks. Her PCP is Dr. Renne Crigler please see past medical history and medication list in chart for details. Patient in 57 had a total abdominal hysterectomy as a result of fibroid uterus along with a pubovaginal sling in 1998. Patient denies any stress urinary incontinence. Patient will longer sexually active. Her flu vaccine is up-to-date. She has not had a shingles vaccine. Patient has history of colon polyp and is due for a colonoscopy next year. Patient with no prior history of abnormal Pap smear. Mammogram January 2014 demonstrated dense breast but normal. Patient is receiving her bone density study for at her PCP office.  Past medical history,surgical history, family history and social history were all reviewed and documented in the EPIC chart.  Gynecologic History No LMP recorded. Patient has had a hysterectomy. Contraception: status post hysterectomy Last Pap: 2010.Marland Kitchen Results were: abnormal Last mammogram: 2014. Results were: normal  Obstetric History OB History  Gravida Para Term Preterm AB SAB TAB Ectopic Multiple Living  2 2 2       2     # Outcome Date GA Lbr Len/2nd Weight Sex Delivery Anes PTL Lv  2 TRM           1 TRM                ROS: A ROS was performed and pertinent positives and negatives are included in the history.  GENERAL: No fevers or chills. HEENT: No change in vision, no earache, sore throat or sinus congestion. NECK: No pain or stiffness. CARDIOVASCULAR: No chest pain or pressure. No palpitations. PULMONARY: No shortness of breath, cough or wheeze. GASTROINTESTINAL: No abdominal pain, nausea, vomiting or diarrhea, melena or bright red blood per rectum. GENITOURINARY: No urinary  frequency, urgency, hesitancy or dysuria. MUSCULOSKELETAL: No joint or muscle pain, no back pain, no recent trauma. DERMATOLOGIC: No rash, no itching, no lesions. ENDOCRINE: No polyuria, polydipsia, no heat or cold intolerance. No recent change in weight. HEMATOLOGICAL: No anemia or easy bruising or bleeding. NEUROLOGIC: No headache, seizures, numbness, tingling or weakness. PSYCHIATRIC: No depression, no loss of interest in normal activity or change in sleep pattern.     Exam: chaperone present  BP 130/70  Ht 5\' 6"  (1.676 m)  Wt 139 lb (63.05 kg)  BMI 22.45 kg/m2  Body mass index is 22.45 kg/(m^2).  General appearance : Well developed well nourished female. No acute distress HEENT: Neck supple, trachea midline, no carotid bruits, no thyroidmegaly Lungs: Clear to auscultation, no rhonchi or wheezes, or rib retractions  Heart: Regular rate and rhythm, no murmurs or gallops Breast:Examined in sitting and supine position were symmetrical in appearance, no palpable masses or tenderness,  no skin retraction, no nipple inversion, no nipple discharge, no skin discoloration, no axillary or supraclavicular lymphadenopathy Abdomen: no palpable masses or tenderness, no rebound or guarding Extremities: no edema or skin discoloration or tenderness  Pelvic:  Bartholin, Urethra, Skene Glands: Within normal limits             Vagina: No gross lesions or discharge, first-degree rectocele  Cervix: Absent  Uterus absent  Adnexa  Without masses or tenderness  Anus and perineum  normal  Rectovaginal  normal sphincter tone without palpated masses or tenderness             Hemoccult PCP providing Hemoccult cards     Assessment/Plan:  72 y.o. female with vaginal atrophy recently was placed on estrogen vaginal cream 3 times a week by her urologist who is treating her for recurrent urinary tract infections. Patient scheduled to see her PCP in the next few months and she will be receiving her blood were  clear. I have requested that we received a copy of her bone density study to update our records. Patient states she has had history of vitamin D deficiency and is on 50,000 units once a month along with 2000 units daily. I asked her to discuss this with her PCP when she has her blood were drawn. Pap smear was not done today in accordance with the new guidelines. She will have her mammogram in January of next year and I recommend that she requests a 3-D due to the fact of her breast mammogram demonstrated that there were dense.  Note: This dictation was prepared with  Dragon/digital dictation along withSmart phrase technology. Any transcriptional errors that result from this process are unintentional.   Ok Edwards MD, 2:32 PM 01/08/2013

## 2013-01-08 NOTE — Patient Instructions (Addendum)
Shingles Vaccine What You Need to Know WHAT IS SHINGLES?  Shingles is a painful skin rash, often with blisters. It is also called Herpes Zoster or just Zoster.  A shingles rash usually appears on one side of the face or body and lasts from 2 to 4 weeks. Its main symptom is pain, which can be quite severe. Other symptoms of shingles can include fever, headache, chills, and upset stomach. Very rarely, a shingles infection can lead to pneumonia, hearing problems, blindness, brain inflammation (encephalitis), or death.  For about 1 person in 5, severe pain can continue even after the rash clears up. This is called post-herpetic neuralgia.  Shingles is caused by the Varicella Zoster virus. This is the same virus that causes chickenpox. Only someone who has had a case of chickenpox or rarely, has gotten chickenpox vaccine, can get shingles. The virus stays in your body. It can reappear many years later to cause a case of shingles.  You cannot catch shingles from another person with shingles. However, a person who has never had chickenpox (or chickenpox vaccine) could get chickenpox from someone with shingles. This is not very common.  Shingles is far more common in people 50 and older than in younger people. It is also more common in people whose immune systems are weakened because of a disease such as cancer or drugs such as steroids or chemotherapy.  At least 1 million people get shingles per year in the [place]. SHINGLES VACCINE  A vaccine for shingles was licensed in 2006. In clinical trials, the vaccine reduced the risk of shingles by 50%. It can also reduce the pain in people who still get shingles after being vaccinated.  A single dose of shingles vaccine is recommended for adults 60 years of age and older. SOME PEOPLE SHOULD NOT GET SHINGLES VACCINE OR SHOULD WAIT A person should not get shingles vaccine if he or she:  Has ever had a life-threatening allergic reaction to gelatin, the  antibiotic neomycin, or any other component of shingles vaccine. Tell your caregiver if you have any severe allergies.  Has a weakened immune system because of current:  AIDS or another disease that affects the immune system.  Treatment with drugs that affect the immune system, such as prolonged use of high-dose steroids.  Cancer treatment, such as radiation or chemotherapy.  Cancer affecting the bone marrow or lymphatic system, such as leukemia or lymphoma.  Is pregnant, or might be pregnant. Women should not become pregnant until at least 4 weeks after getting shingles vaccine. Someone with a minor illness, such as a cold, may be vaccinated. Anyone with a moderate or severe acute illness should usually wait until he or she recovers before getting the vaccine. This includes anyone with a temperature of 101.3 F (38 C) or higher. WHAT ARE THE RISKS FROM SHINGLES VACCINE?  A vaccine, like any medicine, could possibly cause serious problems, such as severe allergic reactions. However, the risk of a vaccine causing serious harm, or death, is extremely small.  No serious problems have been identified with shingles vaccine. Mild Problems  Redness, soreness, swelling, or itching at the site of the injection (about 1 person in 3).  Headache (about 1 person in 70). Like all vaccines, shingles vaccine is being closely monitored for unusual or severe problems. WHAT IF THERE IS A MODERATE OR SEVERE REACTION? What should I look for? Any unusual condition, such as a severe allergic reaction or a high fever. If a severe allergic reaction   occurred, it would be within a few minutes to an hour after the shot. Signs of a serious allergic reaction can include difficulty breathing, weakness, hoarseness or wheezing, a fast heartbeat, hives, dizziness, paleness, or swelling of the throat. What should I do?  Call your caregiver, or get the person to a caregiver right away.  Tell the caregiver what  happened, the date and time it happened, and when the vaccination was given.  Ask the caregiver to report the reaction by filing a Vaccine Adverse Event Reporting System (VAERS) form. Or, you can file this report through the VAERS web site at www.vaers.LAgents.no or by calling 1-(337)099-8481. VAERS does not provide medical advice. HOW CAN I LEARN MORE?  Ask your caregiver. He or she can give you the vaccine package insert or suggest other sources of information.  Contact the Centers for Disease Control and Prevention (CDC):  Call 7471310935 (1-800-CDC-INFO).  Visit the CDC website at PicCapture.uy CDC Shingles Vaccine VIS (12/05/07) Document Released: 12/13/2005 Document Revised: 05/10/2011 Document Reviewed: 06/07/2012 [facility] Patient Information 2014 [place], [place].   Please have your primary send me a copy of your bone density study. Also check with him on the amount of vitamin D you are taking.

## 2013-01-09 DIAGNOSIS — Z7901 Long term (current) use of anticoagulants: Secondary | ICD-10-CM | POA: Diagnosis not present

## 2013-01-23 DIAGNOSIS — Z7901 Long term (current) use of anticoagulants: Secondary | ICD-10-CM | POA: Diagnosis not present

## 2013-02-06 ENCOUNTER — Other Ambulatory Visit: Payer: Self-pay

## 2013-02-06 DIAGNOSIS — Z7901 Long term (current) use of anticoagulants: Secondary | ICD-10-CM | POA: Diagnosis not present

## 2013-02-06 DIAGNOSIS — Z1231 Encounter for screening mammogram for malignant neoplasm of breast: Secondary | ICD-10-CM

## 2013-02-07 DIAGNOSIS — E119 Type 2 diabetes mellitus without complications: Secondary | ICD-10-CM | POA: Diagnosis not present

## 2013-02-07 DIAGNOSIS — Z961 Presence of intraocular lens: Secondary | ICD-10-CM | POA: Diagnosis not present

## 2013-02-08 DIAGNOSIS — N3020 Other chronic cystitis without hematuria: Secondary | ICD-10-CM | POA: Diagnosis not present

## 2013-02-08 DIAGNOSIS — R339 Retention of urine, unspecified: Secondary | ICD-10-CM | POA: Diagnosis not present

## 2013-02-12 DIAGNOSIS — R209 Unspecified disturbances of skin sensation: Secondary | ICD-10-CM | POA: Diagnosis not present

## 2013-02-12 DIAGNOSIS — M79609 Pain in unspecified limb: Secondary | ICD-10-CM | POA: Diagnosis not present

## 2013-02-20 DIAGNOSIS — Z7901 Long term (current) use of anticoagulants: Secondary | ICD-10-CM | POA: Diagnosis not present

## 2013-02-28 DIAGNOSIS — M545 Low back pain: Secondary | ICD-10-CM | POA: Diagnosis not present

## 2013-03-05 DIAGNOSIS — M545 Low back pain, unspecified: Secondary | ICD-10-CM | POA: Diagnosis not present

## 2013-03-06 DIAGNOSIS — Z7901 Long term (current) use of anticoagulants: Secondary | ICD-10-CM | POA: Diagnosis not present

## 2013-03-07 DIAGNOSIS — E1149 Type 2 diabetes mellitus with other diabetic neurological complication: Secondary | ICD-10-CM | POA: Diagnosis not present

## 2013-03-07 DIAGNOSIS — E7800 Pure hypercholesterolemia, unspecified: Secondary | ICD-10-CM | POA: Diagnosis not present

## 2013-03-08 DIAGNOSIS — M545 Low back pain, unspecified: Secondary | ICD-10-CM | POA: Diagnosis not present

## 2013-03-12 ENCOUNTER — Ambulatory Visit
Admission: RE | Admit: 2013-03-12 | Discharge: 2013-03-12 | Disposition: A | Discharge status: Home / Self Care | Payer: Medicare Other | Source: Ambulatory Visit

## 2013-03-12 DIAGNOSIS — Z1231 Encounter for screening mammogram for malignant neoplasm of breast: Secondary | ICD-10-CM | POA: Diagnosis not present

## 2013-03-15 DIAGNOSIS — M545 Low back pain, unspecified: Secondary | ICD-10-CM | POA: Diagnosis not present

## 2013-03-19 DIAGNOSIS — E1149 Type 2 diabetes mellitus with other diabetic neurological complication: Secondary | ICD-10-CM | POA: Diagnosis not present

## 2013-03-19 DIAGNOSIS — I10 Essential (primary) hypertension: Secondary | ICD-10-CM | POA: Diagnosis not present

## 2013-03-19 DIAGNOSIS — E7800 Pure hypercholesterolemia, unspecified: Secondary | ICD-10-CM | POA: Diagnosis not present

## 2013-03-19 DIAGNOSIS — M545 Low back pain, unspecified: Secondary | ICD-10-CM | POA: Diagnosis not present

## 2013-03-20 DIAGNOSIS — Z7901 Long term (current) use of anticoagulants: Secondary | ICD-10-CM | POA: Diagnosis not present

## 2013-03-22 DIAGNOSIS — M545 Low back pain, unspecified: Secondary | ICD-10-CM | POA: Diagnosis not present

## 2013-03-26 DIAGNOSIS — M79609 Pain in unspecified limb: Secondary | ICD-10-CM | POA: Diagnosis not present

## 2013-03-26 DIAGNOSIS — M25559 Pain in unspecified hip: Secondary | ICD-10-CM | POA: Diagnosis not present

## 2013-03-27 DIAGNOSIS — M545 Low back pain, unspecified: Secondary | ICD-10-CM | POA: Diagnosis not present

## 2013-03-29 DIAGNOSIS — M545 Low back pain, unspecified: Secondary | ICD-10-CM | POA: Diagnosis not present

## 2013-04-04 DIAGNOSIS — M545 Low back pain, unspecified: Secondary | ICD-10-CM | POA: Diagnosis not present

## 2013-04-06 DIAGNOSIS — M545 Low back pain, unspecified: Secondary | ICD-10-CM | POA: Diagnosis not present

## 2013-04-09 DIAGNOSIS — M545 Low back pain, unspecified: Secondary | ICD-10-CM | POA: Diagnosis not present

## 2013-04-10 DIAGNOSIS — Z7901 Long term (current) use of anticoagulants: Secondary | ICD-10-CM | POA: Diagnosis not present

## 2013-04-11 DIAGNOSIS — N3020 Other chronic cystitis without hematuria: Secondary | ICD-10-CM | POA: Diagnosis not present

## 2013-04-13 DIAGNOSIS — M545 Low back pain, unspecified: Secondary | ICD-10-CM | POA: Diagnosis not present

## 2013-04-20 DIAGNOSIS — M545 Low back pain, unspecified: Secondary | ICD-10-CM | POA: Diagnosis not present

## 2013-04-27 DIAGNOSIS — M545 Low back pain, unspecified: Secondary | ICD-10-CM | POA: Diagnosis not present

## 2013-05-02 DIAGNOSIS — M545 Low back pain, unspecified: Secondary | ICD-10-CM | POA: Diagnosis not present

## 2013-05-08 DIAGNOSIS — Z7901 Long term (current) use of anticoagulants: Secondary | ICD-10-CM | POA: Diagnosis not present

## 2013-05-09 DIAGNOSIS — M545 Low back pain, unspecified: Secondary | ICD-10-CM | POA: Diagnosis not present

## 2013-05-24 DIAGNOSIS — M545 Low back pain, unspecified: Secondary | ICD-10-CM | POA: Diagnosis not present

## 2013-05-25 DIAGNOSIS — IMO0002 Reserved for concepts with insufficient information to code with codable children: Secondary | ICD-10-CM | POA: Diagnosis not present

## 2013-05-25 DIAGNOSIS — E1142 Type 2 diabetes mellitus with diabetic polyneuropathy: Secondary | ICD-10-CM | POA: Diagnosis not present

## 2013-05-25 DIAGNOSIS — M25559 Pain in unspecified hip: Secondary | ICD-10-CM | POA: Diagnosis not present

## 2013-05-25 DIAGNOSIS — E1149 Type 2 diabetes mellitus with other diabetic neurological complication: Secondary | ICD-10-CM | POA: Diagnosis not present

## 2013-06-05 DIAGNOSIS — Z7901 Long term (current) use of anticoagulants: Secondary | ICD-10-CM | POA: Diagnosis not present

## 2013-06-06 DIAGNOSIS — Z79899 Other long term (current) drug therapy: Secondary | ICD-10-CM | POA: Diagnosis not present

## 2013-06-18 DIAGNOSIS — Z7901 Long term (current) use of anticoagulants: Secondary | ICD-10-CM | POA: Diagnosis not present

## 2013-07-17 DIAGNOSIS — Z7901 Long term (current) use of anticoagulants: Secondary | ICD-10-CM | POA: Diagnosis not present

## 2013-07-31 DIAGNOSIS — Z7901 Long term (current) use of anticoagulants: Secondary | ICD-10-CM | POA: Diagnosis not present

## 2013-08-10 DIAGNOSIS — Z0000 Encounter for general adult medical examination without abnormal findings: Secondary | ICD-10-CM | POA: Diagnosis not present

## 2013-08-10 DIAGNOSIS — E559 Vitamin D deficiency, unspecified: Secondary | ICD-10-CM | POA: Diagnosis not present

## 2013-08-10 DIAGNOSIS — E039 Hypothyroidism, unspecified: Secondary | ICD-10-CM | POA: Diagnosis not present

## 2013-08-10 DIAGNOSIS — Z23 Encounter for immunization: Secondary | ICD-10-CM | POA: Diagnosis not present

## 2013-08-10 DIAGNOSIS — Z7982 Long term (current) use of aspirin: Secondary | ICD-10-CM | POA: Diagnosis not present

## 2013-08-10 DIAGNOSIS — E1149 Type 2 diabetes mellitus with other diabetic neurological complication: Secondary | ICD-10-CM | POA: Diagnosis not present

## 2013-08-10 DIAGNOSIS — I10 Essential (primary) hypertension: Secondary | ICD-10-CM | POA: Diagnosis not present

## 2013-08-16 DIAGNOSIS — E7800 Pure hypercholesterolemia, unspecified: Secondary | ICD-10-CM | POA: Diagnosis not present

## 2013-08-16 DIAGNOSIS — M79609 Pain in unspecified limb: Secondary | ICD-10-CM | POA: Diagnosis not present

## 2013-08-16 DIAGNOSIS — E1149 Type 2 diabetes mellitus with other diabetic neurological complication: Secondary | ICD-10-CM | POA: Diagnosis not present

## 2013-08-16 DIAGNOSIS — G4733 Obstructive sleep apnea (adult) (pediatric): Secondary | ICD-10-CM | POA: Diagnosis not present

## 2013-08-16 DIAGNOSIS — I10 Essential (primary) hypertension: Secondary | ICD-10-CM | POA: Diagnosis not present

## 2013-08-23 DIAGNOSIS — Z7901 Long term (current) use of anticoagulants: Secondary | ICD-10-CM | POA: Diagnosis not present

## 2013-09-05 DIAGNOSIS — R059 Cough, unspecified: Secondary | ICD-10-CM | POA: Diagnosis not present

## 2013-09-05 DIAGNOSIS — R05 Cough: Secondary | ICD-10-CM | POA: Diagnosis not present

## 2013-09-05 DIAGNOSIS — J45909 Unspecified asthma, uncomplicated: Secondary | ICD-10-CM | POA: Diagnosis not present

## 2013-09-06 DIAGNOSIS — Z7901 Long term (current) use of anticoagulants: Secondary | ICD-10-CM | POA: Diagnosis not present

## 2013-09-14 DIAGNOSIS — Z7901 Long term (current) use of anticoagulants: Secondary | ICD-10-CM | POA: Diagnosis not present

## 2013-09-26 DIAGNOSIS — IMO0002 Reserved for concepts with insufficient information to code with codable children: Secondary | ICD-10-CM | POA: Diagnosis not present

## 2013-09-26 DIAGNOSIS — E1149 Type 2 diabetes mellitus with other diabetic neurological complication: Secondary | ICD-10-CM | POA: Diagnosis not present

## 2013-09-26 DIAGNOSIS — E1142 Type 2 diabetes mellitus with diabetic polyneuropathy: Secondary | ICD-10-CM | POA: Diagnosis not present

## 2013-10-02 DIAGNOSIS — Z7901 Long term (current) use of anticoagulants: Secondary | ICD-10-CM | POA: Diagnosis not present

## 2013-10-03 DIAGNOSIS — I10 Essential (primary) hypertension: Secondary | ICD-10-CM | POA: Diagnosis not present

## 2013-10-03 DIAGNOSIS — R599 Enlarged lymph nodes, unspecified: Secondary | ICD-10-CM | POA: Diagnosis not present

## 2013-10-10 DIAGNOSIS — N3020 Other chronic cystitis without hematuria: Secondary | ICD-10-CM | POA: Diagnosis not present

## 2013-10-10 DIAGNOSIS — N952 Postmenopausal atrophic vaginitis: Secondary | ICD-10-CM | POA: Diagnosis not present

## 2013-10-15 DIAGNOSIS — K219 Gastro-esophageal reflux disease without esophagitis: Secondary | ICD-10-CM | POA: Diagnosis not present

## 2013-10-15 DIAGNOSIS — R221 Localized swelling, mass and lump, neck: Secondary | ICD-10-CM | POA: Diagnosis not present

## 2013-10-15 DIAGNOSIS — R490 Dysphonia: Secondary | ICD-10-CM | POA: Diagnosis not present

## 2013-10-15 DIAGNOSIS — R220 Localized swelling, mass and lump, head: Secondary | ICD-10-CM | POA: Diagnosis not present

## 2013-10-16 DIAGNOSIS — Z7901 Long term (current) use of anticoagulants: Secondary | ICD-10-CM | POA: Diagnosis not present

## 2013-10-24 ENCOUNTER — Other Ambulatory Visit: Payer: Self-pay | Admitting: *Deleted

## 2013-10-24 ENCOUNTER — Other Ambulatory Visit: Payer: Self-pay | Admitting: Otolaryngology

## 2013-10-24 DIAGNOSIS — R220 Localized swelling, mass and lump, head: Secondary | ICD-10-CM

## 2013-10-24 DIAGNOSIS — R221 Localized swelling, mass and lump, neck: Principal | ICD-10-CM

## 2013-10-29 ENCOUNTER — Ambulatory Visit
Admission: RE | Admit: 2013-10-29 | Discharge: 2013-10-29 | Disposition: A | Discharge status: Home / Self Care | Payer: Medicare Other | Source: Ambulatory Visit | Attending: Otolaryngology | Admitting: Otolaryngology

## 2013-10-29 DIAGNOSIS — R220 Localized swelling, mass and lump, head: Secondary | ICD-10-CM

## 2013-10-29 DIAGNOSIS — R221 Localized swelling, mass and lump, neck: Principal | ICD-10-CM

## 2013-10-30 DIAGNOSIS — Z7901 Long term (current) use of anticoagulants: Secondary | ICD-10-CM | POA: Diagnosis not present

## 2013-11-01 ENCOUNTER — Other Ambulatory Visit: Payer: Self-pay | Admitting: Otolaryngology

## 2013-11-01 ENCOUNTER — Other Ambulatory Visit (HOSPITAL_COMMUNITY)
Admission: RE | Admit: 2013-11-01 | Discharge: 2013-11-01 | Disposition: A | Discharge status: Home / Self Care | Payer: Medicare Other | Source: Ambulatory Visit | Attending: Otolaryngology | Admitting: Otolaryngology

## 2013-11-01 DIAGNOSIS — R220 Localized swelling, mass and lump, head: Secondary | ICD-10-CM | POA: Diagnosis not present

## 2013-11-01 DIAGNOSIS — R221 Localized swelling, mass and lump, neck: Principal | ICD-10-CM

## 2013-11-01 DIAGNOSIS — R599 Enlarged lymph nodes, unspecified: Secondary | ICD-10-CM | POA: Diagnosis not present

## 2013-11-09 DIAGNOSIS — R220 Localized swelling, mass and lump, head: Secondary | ICD-10-CM | POA: Diagnosis not present

## 2013-11-09 DIAGNOSIS — R599 Enlarged lymph nodes, unspecified: Secondary | ICD-10-CM | POA: Diagnosis not present

## 2013-11-09 DIAGNOSIS — R221 Localized swelling, mass and lump, neck: Secondary | ICD-10-CM | POA: Diagnosis not present

## 2013-11-24 NOTE — Pre-Procedure Instructions (Signed)
Patty [name]  11/24/2013   Your procedure is scheduled on:  Fri, Oct 9 @ 10:15 AM  Report to [facility]  at 8:15 AM.  Call this number if you have problems the morning of surgery: [phone]   Remember:   Do not eat food or drink liquids after midnight.   Take these medicines the morning of surgery with A SIP OF WATER: Albuterol<Bring Your Inhaler With You>,Bentyl(Dicyclomine),Digoxin(Lanoxin),Diltiazem(Cardizem),Fluticasone(Diflucan),Fluticasone-Salmeterol(Advair),Synthroid(Levothyroxine),and Prednisone(Deltasone)                Stop taking your Coumadin as you have been instructed.Also stop taking your Lovaza and any Vitamins or Herbals. No Goody's,BC's,Aleve,or Ibuprofen.   Do not wear jewelry, make-up or nail polish.  Do not wear lotions, powders, or perfumes. You may wear deodorant.  Do not shave 48 hours prior to surgery.  Do not bring valuables to the hospital.  [facility] is not responsible                  for any belongings or valuables.               Contacts, dentures or bridgework may not be worn into surgery.  Leave suitcase in the car. After surgery it may be brought to your room.  For patients admitted to the hospital, discharge time is determined by your                treatment team.               Patients discharged the day of surgery will not be allowed to drive  home.    Special Instructions:  [facility] - Preparing for Surgery  Before surgery, you can play an important role.  Because skin is not sterile, your skin needs to be as free of germs as possible.  You can reduce the number of germs on you skin by washing with CHG (chlorahexidine gluconate) soap before surgery.  CHG is an antiseptic cleaner which kills germs and bonds with the skin to continue killing germs even after washing.  Please DO NOT use if you have an allergy to CHG or antibacterial soaps.  If your skin becomes reddened/irritated stop using the CHG and inform your nurse when you  arrive at Short Stay.  Do not shave (including legs and underarms) for at least 48 hours prior to the first CHG shower.  You may shave your face.  Please follow these instructions carefully:   1.  Shower with CHG Soap the night before surgery and the                                morning of Surgery.  2.  If you choose to wash your hair, wash your hair first as usual with your       normal shampoo.  3.  After you shampoo, rinse your hair and body thoroughly to remove the                      Shampoo.  4.  Use CHG as you would any other liquid soap.  You can apply chg directly       to the skin and wash gently with scrungie or a clean washcloth.  5.  Apply the CHG Soap to your body ONLY FROM THE NECK DOWN.        Do not use on open wounds or open  sores.  Avoid contact with your eyes,       ears, mouth and genitals (private parts).  Wash genitals (private parts)       with your normal soap.  6.  Wash thoroughly, paying special attention to the area where your surgery        will be performed.  7.  Thoroughly rinse your body with warm water from the neck down.  8.  DO NOT shower/wash with your normal soap after using and rinsing off       the CHG Soap.  9.  Pat yourself dry with a clean towel.            10.  Wear clean pajamas.            11.  Place clean sheets on your bed the night of your first shower and do not        sleep with pets.  Day of Surgery  Do not apply any lotions/deoderants the morning of surgery.  Please wear clean clothes to the hospital/surgery center.     Please read over the following fact sheets that you were given: Pain Booklet, Coughing and Deep Breathing and Surgical Site Infection Prevention

## 2013-11-26 ENCOUNTER — Ambulatory Visit (HOSPITAL_COMMUNITY)
Admission: RE | Admit: 2013-11-26 | Discharge: 2013-11-26 | Disposition: A | Discharge status: Home / Self Care | Payer: Medicare Other | Source: Ambulatory Visit | Attending: Anesthesiology | Admitting: Anesthesiology

## 2013-11-26 ENCOUNTER — Encounter (HOSPITAL_COMMUNITY): Payer: Self-pay

## 2013-11-26 ENCOUNTER — Other Ambulatory Visit: Payer: Self-pay | Admitting: Otolaryngology

## 2013-11-26 ENCOUNTER — Encounter (HOSPITAL_COMMUNITY)
Admission: RE | Admit: 2013-11-26 | Discharge: 2013-11-26 | Disposition: A | Discharge status: Home / Self Care | Payer: Medicare Other | Source: Ambulatory Visit | Attending: Otolaryngology | Admitting: Otolaryngology

## 2013-11-26 DIAGNOSIS — I10 Essential (primary) hypertension: Secondary | ICD-10-CM | POA: Insufficient documentation

## 2013-11-26 DIAGNOSIS — Z79899 Other long term (current) drug therapy: Secondary | ICD-10-CM | POA: Insufficient documentation

## 2013-11-26 DIAGNOSIS — E119 Type 2 diabetes mellitus without complications: Secondary | ICD-10-CM | POA: Diagnosis not present

## 2013-11-26 DIAGNOSIS — Z01812 Encounter for preprocedural laboratory examination: Secondary | ICD-10-CM | POA: Insufficient documentation

## 2013-11-26 DIAGNOSIS — Z01810 Encounter for preprocedural cardiovascular examination: Secondary | ICD-10-CM | POA: Diagnosis not present

## 2013-11-26 DIAGNOSIS — R221 Localized swelling, mass and lump, neck: Principal | ICD-10-CM

## 2013-11-26 DIAGNOSIS — I4891 Unspecified atrial fibrillation: Secondary | ICD-10-CM | POA: Diagnosis not present

## 2013-11-26 DIAGNOSIS — R220 Localized swelling, mass and lump, head: Secondary | ICD-10-CM | POA: Insufficient documentation

## 2013-11-26 DIAGNOSIS — Z7901 Long term (current) use of anticoagulants: Secondary | ICD-10-CM | POA: Diagnosis not present

## 2013-11-26 DIAGNOSIS — J45909 Unspecified asthma, uncomplicated: Secondary | ICD-10-CM | POA: Insufficient documentation

## 2013-11-26 DIAGNOSIS — Z01818 Encounter for other preprocedural examination: Secondary | ICD-10-CM | POA: Diagnosis not present

## 2013-11-26 HISTORY — DX: Essential (primary) hypertension: I10

## 2013-11-26 HISTORY — DX: Adverse effect of unspecified anesthetic, initial encounter: T41.45XA

## 2013-11-26 HISTORY — DX: Other complications of anesthesia, initial encounter: T88.59XA

## 2013-11-26 LAB — PROTIME-INR
INR: 2.91 — AB (ref 0.00–1.49)
Prothrombin Time: 30.4 seconds — ABNORMAL HIGH (ref 11.6–15.2)

## 2013-11-26 LAB — BASIC METABOLIC PANEL
ANION GAP: 12 (ref 5–15)
BUN: 16 mg/dL (ref 6–23)
CALCIUM: 10 mg/dL (ref 8.4–10.5)
CO2: 26 meq/L (ref 19–32)
Chloride: 100 mEq/L (ref 96–112)
Creatinine, Ser: 0.55 mg/dL (ref 0.50–1.10)
GFR calc Af Amer: >90 mL/min (ref >90)
GFR, EST NON AFRICAN AMERICAN: 89 mL/min — AB (ref >90)
Glucose, Bld: 102 mg/dL — ABNORMAL HIGH (ref 70–99)
POTASSIUM: 4.4 meq/L (ref 3.7–5.3)
SODIUM: 138 meq/L (ref 137–147)

## 2013-11-26 LAB — APTT: APTT: 43 s — AB (ref 24–37)

## 2013-11-26 LAB — CBC
HEMATOCRIT: 33.4 % — AB (ref 36.0–46.0)
Hemoglobin: 10.7 g/dL — ABNORMAL LOW (ref 12.0–15.0)
MCH: 27 pg (ref 26.0–34.0)
MCHC: 32 g/dL (ref 30.0–36.0)
MCV: 84.1 fL (ref 78.0–100.0)
PLATELETS: 177 10*3/uL (ref 150–400)
RBC: 3.97 MIL/uL (ref 3.87–5.11)
RDW: 14.3 % (ref 11.5–15.5)
WBC: 7 10*3/uL (ref 4.0–10.5)

## 2013-11-26 NOTE — Progress Notes (Signed)
Last dose of Coumadin to be taken on Oct. 3 per Dr. Einar Gip.  PT. Has appt. With Dr. Einar Gip this afternoon.

## 2013-11-26 NOTE — Progress Notes (Addendum)
Call to [facility], requested that Dr. Wilburn Cornelia sign & release his orders, spoke with [facility] & she says it will be done the a.m. Of surgery

## 2013-11-27 NOTE — Progress Notes (Addendum)
Anesthesia Chart Review:  Patient is a 73 year old female posted for excisional biopsy left neck mass on 12/07/13 by Dr. Wilburn Cornelia.    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 Cardiologist is Dr. Einar Gip, last visit 11/26/13.  His note indicates that FNA of her cervical LN was suggestive of lymphoma and this procedure is needed for a definitive diagnosis. He felt she was "very low risk overall from cardiac standpoint..." She was given instructions on when to hold Coumadin preoperatively.    For anesthesia history, she reported respiratory arrest in the 1980's--thought it may be related to too much Morphine.  EKG on 11/26/13 showed SR with first degree AVB, low voltage QRS, LAFB, cannot rule out anterior infarct (age undetermined).  Her last echo in Epic is from 10/10/08; however, Dr. Irven Shelling note mentions an echo from 04/08/11 that showed: "Normal LVEF. Moderate LVH. Moderate LA enlargement at 4.5 cm."  I do not have a copy of this report, and [facility] also said they do not have a copy of an echo at their office.  She had a normal nuclear stress test on 05/06/10, EF 70%.  Cardiac cath on 10/10/08 showed: ANGIOGRAPHIC DATA: Left coronary artery arises and distributes in a dominant fashion. The left main coronary artery is calcified without significant disease. The left anterior descending artery has 20% narrowing in the takeoff of the first diagonal branch. There is another 20% lesion in the mid-to-distal LAD. The origin of the first diagonal has 30% narrowing. The left circumflex coronary artery is a very large dominant vessel. It has 20% narrowing proximally. The remainder of the vessels are without  significant disease. The  right coronary artery is a nondominant vessel and is normal. The left ventricular angiography was performed in the RAO view. This demonstrates normal left ventricular size and contractility with normal systolic function. Ejection fraction is estimated at 65%. There is no mitral regurgitation or prolapse.  FINAL INTERPRETATION:  1. Minor nonobstructive atherosclerotic coronary artery disease.  2. Normal left ventricular function.  CXR on 11/26/13 showed: No active cardiopulmonary disease.  Preoperative labs noted.  Plan to repeat PT/PTT on the day of surgery.  She should be off Coumadin for several days by then.  If follow-up labs are acceptable and otherwise no acute changes then I anticipate that she can proceed as planned.  George Hugh [facility] Short Stay Center/Anesthesiology Phone [phone] 11/28/2013 9:20 AM

## 2013-11-30 ENCOUNTER — Encounter (HOSPITAL_COMMUNITY): Payer: Self-pay | Admitting: Pharmacy Technician

## 2013-12-03 DIAGNOSIS — E11610 Type 2 diabetes mellitus with diabetic neuropathic arthropathy: Secondary | ICD-10-CM | POA: Diagnosis not present

## 2013-12-03 DIAGNOSIS — R591 Generalized enlarged lymph nodes: Secondary | ICD-10-CM | POA: Diagnosis not present

## 2013-12-03 DIAGNOSIS — I10 Essential (primary) hypertension: Secondary | ICD-10-CM | POA: Diagnosis not present

## 2013-12-06 DIAGNOSIS — E11610 Type 2 diabetes mellitus with diabetic neuropathic arthropathy: Secondary | ICD-10-CM | POA: Diagnosis not present

## 2013-12-06 DIAGNOSIS — E785 Hyperlipidemia, unspecified: Secondary | ICD-10-CM | POA: Diagnosis not present

## 2013-12-06 DIAGNOSIS — I10 Essential (primary) hypertension: Secondary | ICD-10-CM | POA: Diagnosis not present

## 2013-12-06 MED ORDER — CEFAZOLIN SODIUM-DEXTROSE 2-3 GM-% IV SOLR
2.0000 g | INTRAVENOUS | Status: AC
  Administered 2013-12-07: 2 g via INTRAVENOUS
  Filled 2013-12-06: qty 50

## 2013-12-07 ENCOUNTER — Encounter (HOSPITAL_COMMUNITY)
Admission: RE | Disposition: A | Discharge status: Home / Self Care | Payer: Self-pay | Source: Ambulatory Visit | Attending: Otolaryngology

## 2013-12-07 ENCOUNTER — Ambulatory Visit (HOSPITAL_COMMUNITY): Payer: Medicare Other | Admitting: Anesthesiology

## 2013-12-07 ENCOUNTER — Encounter (HOSPITAL_COMMUNITY): Payer: Self-pay | Admitting: *Deleted

## 2013-12-07 ENCOUNTER — Ambulatory Visit (HOSPITAL_COMMUNITY)
Admission: RE | Admit: 2013-12-07 | Discharge: 2013-12-07 | Disposition: A | Discharge status: Home / Self Care | Payer: Medicare Other | Source: Ambulatory Visit | Attending: Otolaryngology | Admitting: Otolaryngology

## 2013-12-07 ENCOUNTER — Encounter (HOSPITAL_COMMUNITY): Payer: Medicare Other | Admitting: Vascular Surgery

## 2013-12-07 DIAGNOSIS — R221 Localized swelling, mass and lump, neck: Secondary | ICD-10-CM | POA: Diagnosis present

## 2013-12-07 DIAGNOSIS — Z7901 Long term (current) use of anticoagulants: Secondary | ICD-10-CM | POA: Diagnosis not present

## 2013-12-07 DIAGNOSIS — E119 Type 2 diabetes mellitus without complications: Secondary | ICD-10-CM | POA: Diagnosis not present

## 2013-12-07 DIAGNOSIS — Z91013 Allergy to seafood: Secondary | ICD-10-CM | POA: Diagnosis not present

## 2013-12-07 DIAGNOSIS — E785 Hyperlipidemia, unspecified: Secondary | ICD-10-CM | POA: Insufficient documentation

## 2013-12-07 DIAGNOSIS — M1990 Unspecified osteoarthritis, unspecified site: Secondary | ICD-10-CM | POA: Diagnosis not present

## 2013-12-07 DIAGNOSIS — Z888 Allergy status to other drugs, medicaments and biological substances status: Secondary | ICD-10-CM | POA: Insufficient documentation

## 2013-12-07 DIAGNOSIS — C8231 Follicular lymphoma grade IIIa, lymph nodes of head, face, and neck: Secondary | ICD-10-CM | POA: Diagnosis not present

## 2013-12-07 DIAGNOSIS — Z882 Allergy status to sulfonamides status: Secondary | ICD-10-CM | POA: Diagnosis not present

## 2013-12-07 DIAGNOSIS — G4730 Sleep apnea, unspecified: Secondary | ICD-10-CM | POA: Insufficient documentation

## 2013-12-07 DIAGNOSIS — Z880 Allergy status to penicillin: Secondary | ICD-10-CM | POA: Insufficient documentation

## 2013-12-07 DIAGNOSIS — I482 Chronic atrial fibrillation: Secondary | ICD-10-CM | POA: Diagnosis not present

## 2013-12-07 DIAGNOSIS — J45909 Unspecified asthma, uncomplicated: Secondary | ICD-10-CM | POA: Diagnosis not present

## 2013-12-07 DIAGNOSIS — I10 Essential (primary) hypertension: Secondary | ICD-10-CM | POA: Insufficient documentation

## 2013-12-07 DIAGNOSIS — G629 Polyneuropathy, unspecified: Secondary | ICD-10-CM | POA: Diagnosis not present

## 2013-12-07 DIAGNOSIS — E039 Hypothyroidism, unspecified: Secondary | ICD-10-CM | POA: Diagnosis not present

## 2013-12-07 DIAGNOSIS — D649 Anemia, unspecified: Secondary | ICD-10-CM | POA: Diagnosis not present

## 2013-12-07 DIAGNOSIS — R0602 Shortness of breath: Secondary | ICD-10-CM | POA: Diagnosis not present

## 2013-12-07 DIAGNOSIS — N312 Flaccid neuropathic bladder, not elsewhere classified: Secondary | ICD-10-CM | POA: Insufficient documentation

## 2013-12-07 DIAGNOSIS — Z885 Allergy status to narcotic agent status: Secondary | ICD-10-CM | POA: Insufficient documentation

## 2013-12-07 DIAGNOSIS — C8281 Other types of follicular lymphoma, lymph nodes of head, face, and neck: Secondary | ICD-10-CM | POA: Diagnosis not present

## 2013-12-07 DIAGNOSIS — C8581 Other specified types of non-Hodgkin lymphoma, lymph nodes of head, face, and neck: Secondary | ICD-10-CM | POA: Diagnosis not present

## 2013-12-07 HISTORY — PX: MASS BIOPSY: SHX5445

## 2013-12-07 LAB — GLUCOSE, CAPILLARY
GLUCOSE-CAPILLARY: 126 mg/dL — AB (ref 70–99)
Glucose-Capillary: 136 mg/dL — ABNORMAL HIGH (ref 70–99)

## 2013-12-07 LAB — PROTIME-INR
INR: 1.24 (ref 0.00–1.49)
Prothrombin Time: 15.6 seconds — ABNORMAL HIGH (ref 11.6–15.2)

## 2013-12-07 LAB — APTT: aPTT: 32 seconds (ref 24–37)

## 2013-12-07 SURGERY — BIOPSY, MASS, NECK
Anesthesia: General | Site: Neck | Laterality: Left

## 2013-12-07 MED ORDER — EPHEDRINE SULFATE 50 MG/ML IJ SOLN
INTRAMUSCULAR | Status: DC | PRN
  Administered 2013-12-07: 10 mg via INTRAVENOUS

## 2013-12-07 MED ORDER — PROPOFOL 10 MG/ML IV BOLUS
INTRAVENOUS | Status: AC
  Filled 2013-12-07: qty 20

## 2013-12-07 MED ORDER — FENTANYL CITRATE 0.05 MG/ML IJ SOLN: 25.0000 ug | INTRAMUSCULAR | Status: DC | PRN

## 2013-12-07 MED ORDER — TRAMADOL-ACETAMINOPHEN 37.5-325 MG PO TABS: 1.0000 | ORAL_TABLET | Freq: Four times a day (QID) | ORAL | Status: DC | PRN

## 2013-12-07 MED ORDER — EPHEDRINE SULFATE 50 MG/ML IJ SOLN
INTRAMUSCULAR | Status: AC
  Filled 2013-12-07: qty 1

## 2013-12-07 MED ORDER — BACITRACIN ZINC 500 UNIT/GM EX OINT
TOPICAL_OINTMENT | CUTANEOUS | Status: AC
  Filled 2013-12-07: qty 15

## 2013-12-07 MED ORDER — ONDANSETRON HCL 4 MG/2ML IJ SOLN
INTRAMUSCULAR | Status: AC
  Filled 2013-12-07: qty 2

## 2013-12-07 MED ORDER — MIDAZOLAM HCL 5 MG/5ML IJ SOLN
INTRAMUSCULAR | Status: DC | PRN
  Administered 2013-12-07: 1 mg via INTRAVENOUS

## 2013-12-07 MED ORDER — LIDOCAINE-EPINEPHRINE 1 %-1:100000 IJ SOLN
INTRAMUSCULAR | Status: DC | PRN
  Administered 2013-12-07: 20 mL

## 2013-12-07 MED ORDER — FENTANYL CITRATE 0.05 MG/ML IJ SOLN
INTRAMUSCULAR | Status: DC | PRN
  Administered 2013-12-07: 50 ug via INTRAVENOUS
  Administered 2013-12-07: 100 ug via INTRAVENOUS

## 2013-12-07 MED ORDER — LIDOCAINE HCL (CARDIAC) 20 MG/ML IV SOLN
INTRAVENOUS | Status: AC
  Filled 2013-12-07: qty 5

## 2013-12-07 MED ORDER — SUCCINYLCHOLINE CHLORIDE 20 MG/ML IJ SOLN
INTRAMUSCULAR | Status: DC | PRN
  Administered 2013-12-07: 80 mg via INTRAVENOUS

## 2013-12-07 MED ORDER — ROCURONIUM BROMIDE 50 MG/5ML IV SOLN
INTRAVENOUS | Status: AC
  Filled 2013-12-07: qty 1

## 2013-12-07 MED ORDER — FENTANYL CITRATE 0.05 MG/ML IJ SOLN
INTRAMUSCULAR | Status: AC
  Filled 2013-12-07: qty 5

## 2013-12-07 MED ORDER — HYDROCHLOROTHIAZIDE 25 MG PO TABS
25.0000 mg | ORAL_TABLET | Freq: Every day | ORAL | Status: DC | PRN
Start: 2013-12-07 — End: 2013-12-07

## 2013-12-07 MED ORDER — CIPROFLOXACIN IN D5W 400 MG/200ML IV SOLN
400.0000 mg | Freq: Two times a day (BID) | INTRAVENOUS | Status: DC
  Filled 2013-12-07 (×2): qty 200

## 2013-12-07 MED ORDER — LIDOCAINE HCL (CARDIAC) 20 MG/ML IV SOLN
INTRAVENOUS | Status: DC | PRN
  Administered 2013-12-07: 40 mg via INTRAVENOUS

## 2013-12-07 MED ORDER — DEXAMETHASONE SODIUM PHOSPHATE 10 MG/ML IJ SOLN
10.0000 mg | Freq: Once | INTRAMUSCULAR | Status: AC
  Administered 2013-12-07: 10 mg via INTRAVENOUS
  Filled 2013-12-07: qty 1

## 2013-12-07 MED ORDER — LACTATED RINGERS IV SOLN
INTRAVENOUS | Status: DC
  Administered 2013-12-07: 08:00:00 via INTRAVENOUS

## 2013-12-07 MED ORDER — LIDOCAINE-EPINEPHRINE 1 %-1:100000 IJ SOLN
INTRAMUSCULAR | Status: AC
  Filled 2013-12-07: qty 1

## 2013-12-07 MED ORDER — 0.9 % SODIUM CHLORIDE (POUR BTL) OPTIME
TOPICAL | Status: DC | PRN
  Administered 2013-12-07: 1000 mL

## 2013-12-07 MED ORDER — MIDAZOLAM HCL 2 MG/2ML IJ SOLN
INTRAMUSCULAR | Status: AC
  Filled 2013-12-07: qty 2

## 2013-12-07 MED ORDER — PROPOFOL 10 MG/ML IV BOLUS
INTRAVENOUS | Status: DC | PRN
  Administered 2013-12-07: 90 mg via INTRAVENOUS

## 2013-12-07 MED ORDER — PHENYLEPHRINE HCL 10 MG/ML IJ SOLN
INTRAMUSCULAR | Status: DC | PRN
  Administered 2013-12-07 (×2): 80 ug via INTRAVENOUS

## 2013-12-07 MED ORDER — ONDANSETRON HCL 4 MG/2ML IJ SOLN: 4.0000 mg | Freq: Once | INTRAMUSCULAR | Status: DC | PRN

## 2013-12-07 MED ORDER — DICYCLOMINE HCL 10 MG PO CAPS
10.0000 mg | ORAL_CAPSULE | Freq: Every day | ORAL | Status: DC | PRN
  Filled 2013-12-07: qty 1

## 2013-12-07 SURGICAL SUPPLY — 58 items
ADH SKN CLS APL DERMABOND .7 (GAUZE/BANDAGES/DRESSINGS) ×1
ATTRACTOMAT 16X20 MAGNETIC DRP (DRAPES) IMPLANT
BNDG GAUZE ELAST 4 BULKY (GAUZE/BANDAGES/DRESSINGS) IMPLANT
CANISTER SUCTION 2500CC (MISCELLANEOUS) ×3 IMPLANT
CATH FOLEY 2WAY SLVR  5CC 14FR (CATHETERS)
CATH FOLEY 2WAY SLVR 5CC 14FR (CATHETERS) IMPLANT
CLEANER TIP ELECTROSURG 2X2 (MISCELLANEOUS) ×3 IMPLANT
CONT SPEC 4OZ CLIKSEAL STRL BL (MISCELLANEOUS) ×2 IMPLANT
CORDS BIPOLAR (ELECTRODE) IMPLANT
COVER SURGICAL LIGHT HANDLE (MISCELLANEOUS) ×3 IMPLANT
CRADLE DONUT ADULT HEAD (MISCELLANEOUS) ×3 IMPLANT
DERMABOND ADVANCED (GAUZE/BANDAGES/DRESSINGS) ×2
DERMABOND ADVANCED .7 DNX12 (GAUZE/BANDAGES/DRESSINGS) ×1 IMPLANT
DRAIN JACKSON PRT FLT 10 (DRAIN) IMPLANT
DRAIN PENROSE 1/4X12 LTX STRL (WOUND CARE) IMPLANT
DRAIN SNY 10 ROU (WOUND CARE) IMPLANT
DRAPE ORTHO SPLIT 77X108 STRL (DRAPES) ×3
DRAPE SURG ORHT 6 SPLT 77X108 (DRAPES) IMPLANT
ELECT COATED BLADE 2.86 ST (ELECTRODE) ×3 IMPLANT
ELECT REM PT RETURN 9FT ADLT (ELECTROSURGICAL) ×3
ELECT REM PT RETURN 9FT PED (ELECTROSURGICAL)
ELECTRODE REM PT RETRN 9FT PED (ELECTROSURGICAL) IMPLANT
ELECTRODE REM PT RTRN 9FT ADLT (ELECTROSURGICAL) ×1 IMPLANT
EVACUATOR SILICONE 100CC (DRAIN) IMPLANT
GAUZE SPONGE 4X4 12PLY STRL (GAUZE/BANDAGES/DRESSINGS) IMPLANT
GLOVE BIO SURGEON STRL SZ 6.5 (GLOVE) ×1 IMPLANT
GLOVE BIO SURGEONS STRL SZ 6.5 (GLOVE) ×1
GLOVE BIOGEL M 7.0 STRL (GLOVE) ×6 IMPLANT
GLOVE BIOGEL PI IND STRL 7.0 (GLOVE) IMPLANT
GLOVE BIOGEL PI INDICATOR 7.0 (GLOVE) ×4
GLOVE SURG SS PI 7.0 STRL IVOR (GLOVE) ×2 IMPLANT
GOWN STRL REUS W/ TWL LRG LVL3 (GOWN DISPOSABLE) ×2 IMPLANT
GOWN STRL REUS W/TWL LRG LVL3 (GOWN DISPOSABLE) ×9
KIT BASIN OR (CUSTOM PROCEDURE TRAY) ×3 IMPLANT
KIT ROOM TURNOVER OR (KITS) ×3 IMPLANT
LOCATOR NERVE 3 VOLT (DISPOSABLE) IMPLANT
NDL HYPO 25GX1X1/2 BEV (NEEDLE) ×1 IMPLANT
NEEDLE HYPO 25GX1X1/2 BEV (NEEDLE) ×3 IMPLANT
NS IRRIG 1000ML POUR BTL (IV SOLUTION) ×3 IMPLANT
PAD ARMBOARD 7.5X6 YLW CONV (MISCELLANEOUS) ×4 IMPLANT
PENCIL BUTTON HOLSTER BLD 10FT (ELECTRODE) ×3 IMPLANT
SPONGE INTESTINAL PEANUT (DISPOSABLE) ×3 IMPLANT
STAPLER VISISTAT 35W (STAPLE) ×3 IMPLANT
SUT ETHILON 3 0 PS 1 (SUTURE) IMPLANT
SUT ETHILON 5 0 PS 2 18 (SUTURE) IMPLANT
SUT SILK 2 0 FS (SUTURE) IMPLANT
SUT SILK 3 0 REEL (SUTURE) IMPLANT
SUT VIC AB 3-0 PS2 18 (SUTURE)
SUT VIC AB 3-0 PS2 18XBRD (SUTURE) IMPLANT
SUT VIC AB 4-0 P-3 18X BRD (SUTURE) ×1 IMPLANT
SUT VIC AB 4-0 P3 18 (SUTURE) ×3
SWAB COLLECTION DEVICE MRSA (MISCELLANEOUS) IMPLANT
TOWEL OR 17X24 6PK STRL BLUE (TOWEL DISPOSABLE) ×3 IMPLANT
TOWEL OR 17X26 10 PK STRL BLUE (TOWEL DISPOSABLE) ×3 IMPLANT
TRAY ENT MC OR (CUSTOM PROCEDURE TRAY) ×3 IMPLANT
TUBE ANAEROBIC SPECIMEN COL (MISCELLANEOUS) IMPLANT
TUBING SUCTION BULK 100 FT (MISCELLANEOUS) IMPLANT
WATER STERILE IRR 1000ML POUR (IV SOLUTION) IMPLANT

## 2013-12-07 NOTE — Op Note (Signed)
NAMEKIMONI, PICKERILL NO.:  [id number]  MEDICAL RECORD NO.:  [id number]  LOCATION:  [facility]                         FACILITY:  [facility]  PHYSICIAN:  Early Chars. Wilburn Cornelia, M.D.DATE OF BIRTH:  [birth year]  DATE OF PROCEDURE:  12/07/2013 DATE OF DISCHARGE:                              OPERATIVE REPORT   PREOPERATIVE DIAGNOSIS:  Left deep neck mass.  POSTOPERATIVE DIAGNOSIS:  Left deep neck mass.  INDICATION FOR SURGERY:  Left deep neck mass.  SURGICAL PROCEDURE:  Excisional biopsy, left deep neck mass.  ANESTHESIA:  General endotracheal.  SURGEON:  Early Chars. Wilburn Cornelia, M.D.  COMPLICATIONS:  None.  BLOOD LOSS:  Minimal.  The patient transferred from the operating room to the recovery room in stable condition.  BRIEF HISTORY:  The patient is a 73 year old white female, who was referred to our office for evaluation of swelling and discomfort in the left superolateral neck.  The patient reports a several-month history of increasing fullness and mild discomfort in the left superolateral neck. Examination in the office showed an ill-defined fullness in the superior aspect of the left lateral neck deep to the sternocleidomastoid muscle. A fine needle aspiration of the area was performed and pathology result showed lymphoid tissue without confirmation of malignancy.  A CT scan of the neck was then obtained, which showed a 2-cm ovoid mass consistent with an abnormal lymph node in the superolateral neck deep to the sternocleidomastoid muscle on the left-hand side.  Given the patient's history and findings, I recommended that we consider her for excisional biopsy of the mass under general anesthesia for purposes of removal of the mass as well as adequate tissue for pathologic diagnosis.  The risks and benefits of the procedure were discussed in detail.  The patient was on Coumadin for chronic atrial fibrillation.  She discontinued her anticoagulant therapy the week prior to  surgery at the recommendation of her cardiologist.  The risks and benefits of the procedure were discussed with the patient and her husband, understood and surgery scheduled on elective basis on December 07, 2013.  DESCRIPTION OF PROCEDURE:  The patient was brought to the operating room and placed in supine position on the operating table.  General endotracheal anesthesia was established without difficulty.  When the patient adequately anesthetized, she was positioned on the operating table and prepped and draped.  Her neck was injected with 1 mL of 1% lidocaine with 1:100,000 solution epinephrine injected in a subcutaneous fashion in a preexisting skin crease overlying the palpable mass in the left lateral neck.  After allowing adequate time for vasoconstriction and hemostasis, surgical time-out was obtained.  The patient was prepped and draped and the procedure was begun.  When the patient was prepared for surgery, a 3-cm horizontally-oriented incision was made in the lateral superior neck skin, this carried through the skin, underlying subcutaneous tissue.  The sternocleidomastoid muscle was identified.  The spinal accessory muscle was identified along the mid aspect of the incision line.  The nerve was carefully dissected and then preserved throughout its course.  The indiscrete palpable mass was deep to the sternocleidomastoid muscle near its attachment to the mastoid tip.  The fibers of the sternocleidomastoid muscle were  then divided longitudinally and the muscle was separated to allow access to the neck mass.  This appeared to be a necrotic lymph node.  It was carefully dissected from the surrounding tissue using Bovie electrocautery along the deep superior aspect of zone 2 of the neck, was sent to Pathology for gross microscopic evaluation and possible lymphoma workup.  No other palpable adenopathy or mass was noted in the surgical site.  The patient's incision was then  thoroughly irrigated with sterile saline and closed in multiple layers with reapproximation of the sternocleidomastoid muscle with interrupted 4-0 Vicryl sutures.  The platysmal and deep subcutaneous layers were then closed with interrupted 4-0 Vicryl.  The final skin edge was closed with Dermabond surgical glue for excellent wound approximation.  The patient's neck was then cleaned and she was awakened from anesthetic.  She was extubated and transferred from the operating room to the recovery room in stable condition.  There were no complications and blood loss was minimal.          ______________________________ Early Chars. Wilburn Cornelia, M.D.     DLS/MEDQ  D:  09/32/6712  T:  12/07/2013  Job:  [id number]

## 2013-12-07 NOTE — Brief Op Note (Signed)
12/07/2013  10:20 AM  PATIENT:  Patty [name]  73 y.o. female  PRE-OPERATIVE DIAGNOSIS:  NECK MASS   POST-OPERATIVE DIAGNOSIS:  NECK MASS   PROCEDURE:  Procedure(s): EXCISIONAL BIOPSY LEFT NECK MASS  (Left)  SURGEON:  Surgeon(s) and Role:    * Jerrell Belfast, MD - Primary  PHYSICIAN ASSISTANT: Jolene Provost, PA  ASSISTANTS: none   ANESTHESIA:   general  EBL:  Total I/O In: 750 [I.V.:750] Out: -   BLOOD ADMINISTERED:none  DRAINS: none   LOCAL MEDICATIONS USED:  LIDOCAINE  and Amount: 1 ml  SPECIMEN:  Source of Specimen:  Left Deep Neck  DISPOSITION OF SPECIMEN:  PATHOLOGY  COUNTS:  YES  TOURNIQUET:  * No tourniquets in log *  DICTATION: .Other Dictation: Dictation Number [id number]  PLAN OF CARE: Discharge to home after PACU  PATIENT DISPOSITION:  PACU - hemodynamically stable.   Delay start of Pharmacological VTE agent (>24hrs) due to surgical blood loss or risk of bleeding: not applicable

## 2013-12-07 NOTE — Transfer of Care (Signed)
Immediate Anesthesia Transfer of Care Note  Patient: Patty [name]  Procedure(s) Performed: Procedure(s): EXCISIONAL BIOPSY LEFT NECK MASS  (Left)  Patient Location: PACU  Anesthesia Type:General  Level of Consciousness: awake, alert  and oriented  Airway & Oxygen Therapy: Patient Spontanous Breathing and Patient connected to nasal cannula oxygen  Post-op Assessment: Report given to PACU RN, Post -op Vital signs reviewed and stable and Patient moving all extremities  Post vital signs: Reviewed and stable  Complications: No apparent anesthesia complications

## 2013-12-07 NOTE — Anesthesia Postprocedure Evaluation (Signed)
  Anesthesia Post-op Note  Patient: Patty [name]  Procedure(s) Performed: Procedure(s): EXCISIONAL BIOPSY LEFT NECK MASS  (Left)  Patient Location: PACU  Anesthesia Type:General  Level of Consciousness: awake, alert  and oriented  Airway and Oxygen Therapy: Patient Spontanous Breathing and Patient connected to nasal cannula oxygen  Post-op Pain: mild  Post-op Assessment: Post-op Vital signs reviewed, Patient's Cardiovascular Status Stable, Respiratory Function Stable, Patent Airway, No signs of Nausea or vomiting and Pain level controlled  Post-op Vital Signs: stable  Last Vitals:  Filed Vitals:   12/07/13 1130  BP: 137/61  Pulse: 84  Temp: 36.7 C  Resp: 15    Complications: No apparent anesthesia complications

## 2013-12-07 NOTE — Anesthesia Procedure Notes (Signed)
Procedure Name: Intubation Date/Time: 12/07/2013 9:16 AM Performed by: Melina Copa, Denisia Harpole R Pre-anesthesia Checklist: Patient identified, Emergency Drugs available, Suction available, Patient being monitored and Timeout performed Patient Re-evaluated:Patient Re-evaluated prior to inductionOxygen Delivery Method: Circle system utilized Preoxygenation: Pre-oxygenation with 100% oxygen Intubation Type: IV induction Ventilation: Mask ventilation without difficulty Laryngoscope Size: Mac and 3 Grade View: Grade II Tube type: Oral Tube size: 7.5 mm Number of attempts: 1 Airway Equipment and Method: Stylet Placement Confirmation: ETT inserted through vocal cords under direct vision,  positive ETCO2 and breath sounds checked- equal and bilateral Secured at: 21 cm Tube secured with: Tape Dental Injury: Teeth and Oropharynx as per pre-operative assessment

## 2013-12-07 NOTE — Discharge Instructions (Signed)

## 2013-12-07 NOTE — H&P (Signed)
Patty [name] is an 73 y.o. female.   Chief Complaint: Neck mass HPI: pt with hx of neck mass, FNA + for lymphocytes   Past Medical History  Diagnosis Date  . Asthma 06/15/2010  . Hypothyroidism 06/15/2010  . Hyperlipemia 06/15/2010  . Atrial fibrillation 05/28/2010    Managed with rate control and coumadin  . Long-term (current) use of anticoagulants   . ACE-inhibitor cough   . DI (detrusor instability)   . Fibroid   . Rectocele   . Atrophic vaginitis   . 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 Diabetes mellitus     Type 2  . Peripheral neuropathy   . Hypertension   . Dysrhythmia     cardioversion - 2012  . Sleep apnea     uses CPAP everynight- last study in [place] 5 yrs. or more   . Complication of anesthesia 1980's    post anesth.- states she "went into resp. arrest" , but then remarked that she thought maybe they gave her too much medicine (morphine)   . Shortness of breath     sometimes   . Arthritis 06/15/2010    back    Past Surgical History  Procedure Laterality Date  . Appendectomy  1953  . Partial hysterectomy  1972  . Cholecystectomy  1993  . Back surgery      x2  . Cataract extraction    . Cardiac catheterization  10/10/2008    Nonobstructive CAD  . Abdominal hysterectomy  1972    TAH  . Foot surgery    . Tonsillectomy    . Pubovag repair with sling    . Skin tag removal  2012    leg  . Cardioversion  04/13/2011    Procedure: CARDIOVERSION;  Surgeon: Laverda Page, MD;  Location: [facility];  Service: Cardiovascular;  Laterality: N/A;  . Eye surgery      /w IOL, post cataracts removed     Family History  Problem Relation Age of Onset  . Stroke Father     at 40  . Heart attack Father     at 51  . Hypertension Father   . Heart disease Mother   . Arthritis Mother   . Breast cancer Mother     Age 28  . Heart failure Mother   . Heart attack Brother   . Hypertension Brother   . Diabetes Sister    Social History:  reports that she  has never smoked. She has never used smokeless tobacco. She reports that she does not drink alcohol or use illicit drugs.  Allergies:  Allergies  Allergen Reactions  . Demerol Nausea And Vomiting  . Meperidine Hcl Nausea And Vomiting  . Morphine And Related Other (See Comments)    States she stopped breathing post op- "went into resp. Arrest"  . Multaq [Dronedarone] Other (See Comments)    Blood in urine, elevated liver enzymes  . Oysters [Shellfish Allergy] Rash  . Penicillins Itching and Rash  . Sulfa Drugs Cross Reactors Rash    Medications Prior to Admission  Medication Sig Dispense Refill  . acetaminophen (TYLENOL) 500 MG tablet Take 1,000 mg by mouth at bedtime as needed (pain).      Marland Kitchen albuterol (PROAIR HFA) 108 (90 BASE) MCG/ACT inhaler Inhale 2 puffs into the lungs every 6 (six) hours as needed for wheezing or shortness of breath.      . Cholecalciferol (VITAMIN D) 2000 UNITS tablet Take 2,000 Units by  mouth daily with lunch.      . conjugated estrogens (PREMARIN) vaginal cream Place 1 Applicatorful vaginally 3 (three) times a week. Monday, Wednesday and Friday nights      . dicyclomine (BENTYL) 10 MG capsule Take 10 mg by mouth daily as needed (ibs flare up).       . digoxin (LANOXIN) 0.125 MG tablet Take 125 mcg by mouth daily.       Marland Kitchen diltiazem (CARDIZEM CD) 120 MG 24 hr capsule Take 120 mg by mouth daily.       Marland Kitchen ezetimibe (ZETIA) 10 MG tablet Take 5 mg by mouth daily with supper.       . Fluticasone-Salmeterol (ADVAIR) 250-50 MCG/DOSE AEPB Inhale 1 puff into the lungs every 12 (twelve) hours.       . hydrochlorothiazide (HYDRODIURIL) 25 MG tablet Take 25 mg by mouth daily as needed (ankle swelling).       Marland Kitchen levothyroxine (SYNTHROID, LEVOTHROID) 125 MCG tablet Take 250 mcg by mouth daily before breakfast.       . Magnesium Oxide 500 MG TABS Take 500 mg by mouth at bedtime.      . metFORMIN (GLUCOPHAGE) 500 MG tablet Take 500 mg by mouth 2 (two) times daily with a meal.       . montelukast (SINGULAIR) 10 MG tablet Take 10 mg by mouth daily.      Marland Kitchen omega-3 acid ethyl esters (LOVAZA) 1 G capsule Take 1 g by mouth 2 (two) times daily.      . potassium chloride SA (K-DUR,KLOR-CON) 20 MEQ tablet Take 1 tablet (20 mEq total) by mouth daily.  30 tablet  5  . pravastatin (PRAVACHOL) 40 MG tablet Take 20 mg by mouth See admin instructions. Take 1/2 tablet (20 mg) on Monday, Tuesday, Wednesday and Thursday      . traMADol-acetaminophen (ULTRACET) 37.5-325 MG per tablet Take 1 tablet by mouth daily as needed (pain).       Marland Kitchen warfarin (COUMADIN) 5 MG tablet Take 2.5-5 mg by mouth daily. Take 1/2 tablet (2.5 mg) on Tuesday and Thursday, take 1 tablet (5 mg) on Monday, Wednesday, Friday, Saturday and Sunday        No results found for this or any previous visit (from the past 42 hour(s)). No results found.  Review of Systems  Constitutional: Negative.   HENT: Negative.   Respiratory: Negative.   Cardiovascular: Negative.   Gastrointestinal: Negative.     There were no vitals taken for this visit. Physical Exam  Constitutional: She is oriented to person, place, and time. She appears well-developed.  Neck: Normal range of motion.  Cardiovascular: Normal rate.   Respiratory: Effort normal.  GI: Soft.  Musculoskeletal: Normal range of motion.  Lymphadenopathy:    She has cervical adenopathy.  Neurological: She is alert and oriented to person, place, and time.     Assessment/Plan Adm for OP Exc of neck mass.  [place], Tiyana Galla 12/07/2013, 7:36 AM

## 2013-12-07 NOTE — Anesthesia Preprocedure Evaluation (Signed)
Anesthesia Evaluation  Patient identified by MRN, date of birth, ID band Patient awake    Reviewed: Allergy & Precautions, H&P , NPO status , Patient's Chart, lab work & pertinent test results  Airway Mallampati: II TM Distance: >3 FB Neck ROM: Full    Dental  (+) Teeth Intact, Dental Advisory Given   Pulmonary  breath sounds clear to auscultation        Cardiovascular hypertension, Rhythm:Regular Rate:Normal     Neuro/Psych    GI/Hepatic   Endo/Other  diabetes  Renal/GU      Musculoskeletal   Abdominal   Peds  Hematology   Anesthesia Other Findings   Reproductive/Obstetrics                           Anesthesia Physical Anesthesia Plan  ASA: III  Anesthesia Plan: General   Post-op Pain Management:    Induction: Intravenous  Airway Management Planned: Oral ETT  Additional Equipment:   Intra-op Plan:   Post-operative Plan: Extubation in OR  Informed Consent: I have reviewed the patients History and Physical, chart, labs and discussed the procedure including the risks, benefits and alternatives for the proposed anesthesia with the patient or authorized representative who has indicated his/her understanding and acceptance.   Dental advisory given  Plan Discussed with: CRNA and Anesthesiologist  Anesthesia Plan Comments: (L. Cervical LN, suspicious for lymphoma Chronic atrial fib now in SR, on coumadin, held since 10/3,  INR 1.24 today Hypertension Asthma lungs clear OSA on CPAP Type 2 DM glucose 126  Plan GA with oral ETT  [place] with oral ETT Roberts Gaudy, MD )        Anesthesia Quick Evaluation

## 2013-12-10 ENCOUNTER — Encounter (HOSPITAL_COMMUNITY): Payer: Self-pay | Admitting: Otolaryngology

## 2013-12-17 DIAGNOSIS — Z7901 Long term (current) use of anticoagulants: Secondary | ICD-10-CM | POA: Diagnosis not present

## 2013-12-21 ENCOUNTER — Telehealth: Payer: Self-pay | Admitting: *Deleted

## 2013-12-21 NOTE — Telephone Encounter (Signed)
Placed introductory call to patient.  LVM with request to call me back.  Gayleen Orem, RN, BSN, [place] at [place] [phone]

## 2013-12-21 NOTE — Telephone Encounter (Signed)
Patient returned my call from earlier today.  I introduced myself as the oncology nurse navigator that works with Dr. Isidore Moos, I indicated that I would be joining her during her appts next week Wednesday and would explain more about my role as a member of the Care Team when we meet.  I confirmed her understanding of [facility] location, arrival and registration procedure.  I encouraged her to call me with any questions prior to next week's appt.  She verbalized understanding.  Gayleen Orem, RN, BSN, [place] at [place] [phone]

## 2013-12-23 ENCOUNTER — Other Ambulatory Visit: Payer: Self-pay | Admitting: Gynecology

## 2013-12-25 ENCOUNTER — Encounter: Payer: Self-pay | Admitting: Radiation Oncology

## 2013-12-25 NOTE — Progress Notes (Signed)
Radiation Oncology         [phone] ________________________________  Initial outpatient Consultation  Name: Patty [name] MRN: [id number]  Date: 12/26/2013  DOB: [birth year]  [id number] DAVIDSON, MD  Patty Belfast, MD   REFERRING PHYSICIAN: Jerrell Belfast, MD  DIAGNOSIS:    ICD-9-CM ICD-10-CM  1. 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, face, and neck 202.81 C85.91    HISTORY OF PRESENT ILLNESS::Patty [name] is a 73 y.o. female who presented with swelling and discomfort in the left superolateral neck in June.  She eventually told her dentist, who referred her to her PCP. She was then sent to Dr Wilburn Cornelia.  Examination showed an ill-defined fullness in the superior aspect of the left lateral neck deep to the sternocleidomastoid muscle.   CT Scan neck 10-29-13:Left level 2 B 2.8 x 1.2 x 2.5 cm lymphadenopathy corresponds to patient's palpable abnormality. Source of this is indeterminate as primary neck mass is not identified as noted above. Right level IV grouping of lymph nodes spans over 2 x 1 x 1.6 cm. Pre aortic top-normal size lymph nodes. The possibility of left internal mammary slightly enlarged lymph nodes is raised (at the edge of the field of imaging).   Excisional Biopsy of left Neck Mass revealed:  Diagnosis  12/07/13  Soft tissue mass, simple excision, Left neck mass  - HIGH GRADE FOLLICULAR LYMPHOMA (GRADE 3A).    She has never smoked. She has never used smokeless tobacco. She reports that she does not drink alcohol or use illicit drugs.  She has lost  55lb intentionally over 3 yrs due to diabetes management. She has sweats at night x 46yr, but rarely do they make her gown or sheets wet.  No fevers. Some tiredness. No pain except some chronic back pain, not severe; it is stable. Marland Kitchen PREVIOUS RADIATION THERAPY: No  PAST MEDICAL HISTORY:  has a past medical history of Asthma (06/15/2010); Hypothyroidism (06/15/2010); Hyperlipemia (06/15/2010); Atrial  fibrillation (05/28/2010); Long-term (current) use of anticoagulants; ACE-inhibitor cough; DI (detrusor instability); Fibroid; Rectocele; Atrophic vaginitis; Cancer (2008); Diabetes mellitus; Peripheral neuropathy; Hypertension; Dysrhythmia; Sleep apnea; Complication of anesthesia (1980's); Shortness of breath; Arthritis (06/15/2010); Anemia; Malignant lymphoma, follicular (42/5/95); CPAP (continuous positive airway pressure) dependence; GERD (gastroesophageal reflux disease); and Hearing loss.    PAST SURGICAL HISTORY: Past Surgical History  Procedure Laterality Date  . Appendectomy  1953  . Partial hysterectomy  1972  . Cholecystectomy  1993  . Back surgery      x2  . Cataract extraction    . Cardiac catheterization  10/10/2008    Nonobstructive CAD  . Abdominal hysterectomy  1972    TAH  . Foot surgery    . Tonsillectomy    . Pubovag repair with sling    . Skin tag removal  2012    leg  . Cardioversion  04/13/2011    Procedure: CARDIOVERSION;  Surgeon: Laverda Page, MD;  Location: [place];  Service: Cardiovascular;  Laterality: N/A;  . Eye surgery      /w IOL, post cataracts removed   . Mass biopsy Left 12/07/2013    Procedure: EXCISIONAL BIOPSY LEFT NECK MASS ;  Surgeon: Patty Belfast, MD;  Location: [facility] OR;  Service: ENT;  Laterality: Left;    FAMILY HISTORY: family history includes Arthritis in her mother; Breast cancer in her mother; Diabetes in her sister; Heart attack in her brother and father; Heart disease in her mother; Heart failure in her mother; Hypertension in her brother and  father; Stroke in her father.  SOCIAL HISTORY:  reports that she has never smoked. She has never used smokeless tobacco. She reports that she does not drink alcohol or use illicit drugs.  ALLERGIES: Demerol; Meperidine hcl; Morphine and related; Multaq; Oysters; Penicillins; and Sulfa drugs cross reactors  MEDICATIONS:  Current Outpatient Prescriptions  Medication Sig Dispense Refill  .  acetaminophen (TYLENOL) 500 MG tablet Take 1,000 mg by mouth at bedtime as needed (pain).      Marland Kitchen albuterol (PROAIR HFA) 108 (90 BASE) MCG/ACT inhaler Inhale 2 puffs into the lungs every 6 (six) hours as needed for wheezing or shortness of breath.      . Cholecalciferol (VITAMIN D) 2000 UNITS tablet Take 2,000 Units by mouth daily with lunch.      . conjugated estrogens (PREMARIN) vaginal cream Place 1 Applicatorful vaginally 3 (three) times a week. Monday, Wednesday and Friday nights      . dicyclomine (BENTYL) 10 MG capsule Take 10 mg by mouth daily as needed (ibs flare up).       . digoxin (LANOXIN) 0.125 MG tablet Take 125 mcg by mouth daily.       Marland Kitchen diltiazem (CARDIZEM CD) 120 MG 24 hr capsule Take 120 mg by mouth daily.       Marland Kitchen ezetimibe (ZETIA) 10 MG tablet Take 5 mg by mouth daily with supper.       . Fluticasone-Salmeterol (ADVAIR) 250-50 MCG/DOSE AEPB Inhale 1 puff into the lungs every 12 (twelve) hours.       . hydrochlorothiazide (HYDRODIURIL) 25 MG tablet Take 25 mg by mouth daily as needed (ankle swelling).       Marland Kitchen levothyroxine (SYNTHROID, LEVOTHROID) 125 MCG tablet Take 250 mcg by mouth daily before breakfast.       . Magnesium Oxide 500 MG TABS Take 500 mg by mouth at bedtime.      . metFORMIN (GLUCOPHAGE) 500 MG tablet Take 500 mg by mouth 2 (two) times daily with a meal.      . montelukast (SINGULAIR) 10 MG tablet Take 10 mg by mouth daily.      Marland Kitchen omega-3 acid ethyl esters (LOVAZA) 1 G capsule Take 1 g by mouth 2 (two) times daily.      . potassium chloride SA (K-DUR,KLOR-CON) 20 MEQ tablet Take 1 tablet (20 mEq total) by mouth daily.  30 tablet  5  . pravastatin (PRAVACHOL) 40 MG tablet Take 20 mg by mouth See admin instructions. Take 1/2 tablet (20 mg) on Monday, Tuesday, Wednesday and Thursday      . traMADol-acetaminophen (ULTRACET) 37.5-325 MG per tablet Take 1 tablet by mouth every 6 (six) hours as needed.  30 tablet  0  . warfarin (COUMADIN) 5 MG tablet Take 2.5-5 mg by  mouth daily. Take 1/2 tablet (2.5 mg) on Tuesday and Thursday, take 1 tablet (5 mg) on Monday, Wednesday, Friday, Saturday and Sunday       No current facility-administered medications for this encounter.    REVIEW OF SYSTEMS:  Notable for that above.   PHYSICAL EXAM:  height is 5' 6.5" (1.689 m) and weight is 137 lb (62.143 kg).  97.10F, 69 pulse, 20 RR, 148/59 General: Alert and oriented, in no acute distress HEENT: Head is normocephalic. Pupils are equally round and reactive to light. Extraocular movements are intact. Oropharynx is clear. Neck: swelling in upper Left neck with healing scar from excision. No other appreciable nodes. Heart: Regular in rate and rhythm with +systolic murmur throughout  precordium. Chest: Clear to auscultation bilaterally, with no rhonchi, wheezes, or rales. Abdomen: Soft, nontender, nondistended, with no rigidity or guarding. Extremities: No cyanosis or edema. Lymphatics: see neck Skin: No concerning lesions. Musculoskeletal: symmetric strength and muscle tone throughout. Neurologic: Cranial nerves II through XII are grossly intact. No obvious focalities. Speech is fluent. Coordination is intact. Psychiatric: Judgment and insight are intact. Affect is appropriate.   ECOG = 1  0 - Asymptomatic (Fully active, able to carry on all predisease activities without restriction)  1 - Symptomatic but completely ambulatory (Restricted in physically strenuous activity but ambulatory and able to carry out work of a light or sedentary nature. For example, light housework, office work)  2 - Symptomatic, <50% in bed during the day (Ambulatory and capable of all self care but unable to carry out any work activities. Up and about more than 50% of waking hours)  3 - Symptomatic, >50% in bed, but not bedbound (Capable of only limited self-care, confined to bed or chair 50% or more of waking hours)  4 - Bedbound (Completely disabled. Cannot carry on any self-care. Totally  confined to bed or chair)  5 - Death   Eustace Pen MM, Creech RH, Tormey DC, et al. [phone]). "Toxicity and response criteria of the [facility] Group". [place] Oncol. 5 (6): 649-55   LABORATORY DATA:  Lab Results  Component Value Date   WBC 7.0 11/26/2013   HGB 10.7* 11/26/2013   HCT 33.4* 11/26/2013   MCV 84.1 11/26/2013   PLT 177 11/26/2013   CMP     Component Value Date/Time   NA 138 11/26/2013 1405   K 4.4 11/26/2013 1405   CL 100 11/26/2013 1405   CO2 26 11/26/2013 1405   GLUCOSE 102* 11/26/2013 1405   BUN 16 11/26/2013 1405   CREATININE 0.55 11/26/2013 1405   CALCIUM 10.0 11/26/2013 1405   PROT 6.8 06/16/2010 1323   ALBUMIN 4.0 06/16/2010 1323   AST 22 06/16/2010 1323   ALT 33 06/16/2010 1323   ALKPHOS 82 06/16/2010 1323   BILITOT 0.7 06/16/2010 1323   GFRNONAA 89* 11/26/2013 1405   GFRAA >90 11/26/2013 1405         RADIOGRAPHY: see above  Dg Chest 2 View  11/26/2013   CLINICAL DATA:  Preoperative chest radiograph for lymph node biopsy.  EXAM: CHEST  2 VIEW  COMPARISON:  05/09/2010.  FINDINGS: Cardiopericardial silhouette within normal limits. Mediastinal contours normal. Trachea midline. No airspace disease or effusion. Aortic atherosclerosis. Cholecystectomy clips are present in the right upper quadrant.  IMPRESSION: No active cardiopulmonary disease.   Electronically Signed   By: Dereck Ligas M.D.   On: 11/26/2013 15:12      IMPRESSION/PLAN: High Grade Follicular Lymphoma - staging incomplete  1)  Order PET for staging --> Nov 2  2)  Refer to Dr. Alvy Bimler to be seen post-PET at multidisciplinary clinic Nov 4th - chemotherapy is likely going to be the first step in her treatment.   She will also see nutrition, PT, and social work at the [facility] clinic.  3)  Await referral back by Dr Alvy Bimler for sequential radiotherapy, if indicated. Will discuss her PET results at ENT tumor board next week  All questions answers to the patient's satisfaction today.  Gayleen Orem,  RN, our Head and Neck Oncology Navigator will continue to follow her closely with [place]. __________________________________________   Eppie Gibson, MD

## 2013-12-25 NOTE — Progress Notes (Signed)
Lymphoma Location(s) / Histology: High Grade Follicular Lymphoma  Patty [name]  was referred to Dr. Jerrell Belfast for evaluation of swelling and discomfort in the left superolateral neck. The patient reports a several-month history of increasing fullness and mild discomfort in the left superolateral neck.  Examination in the office showed an ill-defined fullness in the superior aspect of the left lateral neck deep to the sternocleidomastoid muscle.  CT Scan: showed a 2-cm ovoid mass consistent with an abnormal lymph node in the superolateral neck deep to the sternocleidomastoid muscle on the left-hand side.  Biopsies of left Neck Mass (if applicable) revealed:  Diagnosis 12/07/13 Soft tissue mass, simple excision, Left neck mass - HIGH GRADE FOLLICULAR LYMPHOMA (GRADE 3A).  Past/Anticipated interventions by medical oncology, if any: Dr. Heath Lark - Referred  Weight changes, if any, over the past 6 months:  Recurrent fevers, or drenching night sweats, if any:   SAFETY ISSUES:  Prior radiation? NO  Pacemaker/ICD? NO  Possible current pregnancy? NO  Is the patient on methotrexate? NO  Current Complaints / other details:  married  She has never smoked. She has never used smokeless tobacco. She reports that she does not drink alcohol or use illicit drugs. Mother had history of breast cancer.

## 2013-12-26 ENCOUNTER — Ambulatory Visit
Admission: RE | Admit: 2013-12-26 | Discharge: 2013-12-26 | Disposition: A | Discharge status: Home / Self Care | Payer: Medicare Other | Source: Ambulatory Visit | Attending: Radiation Oncology | Admitting: Radiation Oncology

## 2013-12-26 ENCOUNTER — Encounter: Payer: Self-pay | Admitting: Radiation Oncology

## 2013-12-26 ENCOUNTER — Encounter: Payer: Self-pay | Admitting: *Deleted

## 2013-12-26 VITALS — Ht 66.5 in | Wt 137.0 lb

## 2013-12-26 DIAGNOSIS — C8591 Non-Hodgkin lymphoma, unspecified, lymph nodes of head, face, and neck: Secondary | ICD-10-CM | POA: Diagnosis not present

## 2013-12-26 DIAGNOSIS — Z510 Encounter for antineoplastic radiation therapy: Secondary | ICD-10-CM | POA: Insufficient documentation

## 2013-12-26 NOTE — Addendum Note (Signed)
Encounter addended by: Deirdre Evener, RN on: 12/26/2013  5:56 PM<BR>     Documentation filed: Charges VN

## 2013-12-26 NOTE — Addendum Note (Signed)
Encounter addended by: Deirdre Evener, RN on: 12/26/2013  2:43 PM<BR>     Documentation filed: Visit Diagnoses

## 2013-12-26 NOTE — Progress Notes (Signed)
Please see the Nurse Progress Note in the MD Initial Consult Encounter for this patient. 

## 2013-12-26 NOTE — Addendum Note (Signed)
Encounter addended by: Andria Rhein, RN on: 12/26/2013  9:13 AM<BR>     Documentation filed: Charges VN

## 2013-12-27 ENCOUNTER — Telehealth: Payer: Self-pay | Admitting: Hematology and Oncology

## 2013-12-27 ENCOUNTER — Ambulatory Visit: Payer: Medicare Other | Admitting: Hematology and Oncology

## 2013-12-27 ENCOUNTER — Ambulatory Visit: Payer: Medicare Other

## 2013-12-27 DIAGNOSIS — Z7901 Long term (current) use of anticoagulants: Secondary | ICD-10-CM | POA: Diagnosis not present

## 2013-12-27 NOTE — Telephone Encounter (Signed)
LEFT MESSAGE FOR PATIENT AND GAVE NP APPT FOR 10/29 @ 12:30 W/DR. [place]

## 2013-12-27 NOTE — Progress Notes (Signed)
Met with patient and her husband during initial consult with Dr. Isidore Moos. 1. Further introduced myself as their Navigator, explained my role as a member of the Care Team, provided contact information for myself and Dr. Isidore Moos, encouraged them to contact me with questions/concerns as treatments/procedures begin. 2. Provided New Patient Information packet:  Contact information for physician and navigator  Advance Directive information ([place] blue pamphlet)  Fall Prevention Patient Safety Plan  [facility] campus map with highlight of [place] 3. Provided introductory explanation of radiation treatment including SIM planning and fitting of head mask, showed them example of mask.   They verbalized understanding of information provided.   I will schedule patient for the 01/02/14 [place] to be seen by dietician, SW, PT for lymphedema evaluation.  Gayleen Orem, RN, BSN, [place] at [place] [phone]

## 2013-12-28 ENCOUNTER — Telehealth: Payer: Self-pay | Admitting: Hematology and Oncology

## 2013-12-28 ENCOUNTER — Telehealth: Payer: Self-pay | Admitting: *Deleted

## 2013-12-28 ENCOUNTER — Ambulatory Visit (HOSPITAL_BASED_OUTPATIENT_CLINIC_OR_DEPARTMENT_OTHER): Payer: Medicare Other | Admitting: Hematology and Oncology

## 2013-12-28 ENCOUNTER — Encounter: Payer: Self-pay | Admitting: Hematology and Oncology

## 2013-12-28 ENCOUNTER — Encounter: Payer: Self-pay | Admitting: *Deleted

## 2013-12-28 ENCOUNTER — Ambulatory Visit: Payer: Medicare Other

## 2013-12-28 VITALS — BP 135/55 | HR 77 | Temp 98.0°F | Resp 18 | Ht 66.5 in | Wt 137.6 lb

## 2013-12-28 DIAGNOSIS — D630 Anemia in neoplastic disease: Secondary | ICD-10-CM

## 2013-12-28 DIAGNOSIS — C8591 Non-Hodgkin lymphoma, unspecified, lymph nodes of head, face, and neck: Secondary | ICD-10-CM | POA: Diagnosis not present

## 2013-12-28 DIAGNOSIS — D638 Anemia in other chronic diseases classified elsewhere: Secondary | ICD-10-CM | POA: Insufficient documentation

## 2013-12-28 NOTE — Telephone Encounter (Signed)
Call from nurse, April, w/ Dr. Einar Gip,  States ok to hold coumadin for 5 days for [facility] placement.  No need to bridge w/ lovenox.  Left Vm for IR informing them of above.

## 2013-12-28 NOTE — Telephone Encounter (Signed)
Confirm echo/ IR appt for 01/04/14.

## 2013-12-28 NOTE — Telephone Encounter (Signed)
Gave avs & cal for Nov. Sent mess to sch tx & to get prior auth for echo.

## 2013-12-28 NOTE — Telephone Encounter (Signed)
Per desk RN I have scheduled appt

## 2013-12-28 NOTE — Progress Notes (Signed)
Notified Butch Penny in [employer] of [facility] on 11/05.

## 2013-12-28 NOTE — Telephone Encounter (Signed)
S/W PATIENT AND GAVE NP APPT FOR 10/30 @ 11 W/DR. [place].

## 2013-12-28 NOTE — Telephone Encounter (Signed)
Pt scheduled for [facility] 11/06 and IR requests her coumadin be held x 5 days prior to procedure.  Called Dr. Irven Shelling office and s/w his nurse April.  She will ask Dr. Einar Gip about coumadin and call us back.

## 2013-12-28 NOTE — Progress Notes (Signed)
[place] NOTE  Patient Care Team: Horatio Pel, MD as PCP - General (Internal Medicine) Laverda Page, MD as Consulting Physician (Cardiology)  CHIEF COMPLAINTS/PURPOSE OF CONSULTATION:  High-grade follicular lymphoma  HISTORY OF PRESENTING ILLNESS:  Patty [name] 73 y.o. female is here because of recent diagnosis of lymphoma. The patient first palpated a lump over the left cervical region around June 2015. It was described as nontender. She was subsequently referred to ENT for further management. I review her chart extensively and summarized as follows.   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   12/07/2013 Pathology Results Accession: [id number] biopsies show high-grade follicular lymphoma.   She has some 50 pounds of intentional weight loss over the last 3 years. She has occasional night sweats and complained of fatigue.  MEDICAL HISTORY:  Past Medical History  Diagnosis Date  . Asthma 06/15/2010  . Hypothyroidism 06/15/2010  . Hyperlipemia 06/15/2010  . Atrial fibrillation 05/28/2010    Managed with rate control and coumadin  . Long-term (current) use of anticoagulants   . ACE-inhibitor cough   . DI (detrusor instability)   . Fibroid   . Rectocele   . Atrophic vaginitis   . 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 Diabetes mellitus     Type 2  . Peripheral neuropathy   . Hypertension   . Dysrhythmia     cardioversion - 2012  . Sleep apnea     uses CPAP everynight- last study in [place] 5 yrs. or more   . Complication of anesthesia 1980's    post anesth.- states she "went into resp. arrest" , but then remarked that she thought maybe they gave her too much  medicine (morphine)   . Shortness of breath     sometimes   . 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 Anemia   . Malignant lymphoma, follicular 01/0/93  . CPAP (continuous positive airway pressure) dependence   . GERD (gastroesophageal reflux disease)   . Hearing loss     SURGICAL HISTORY: Past Surgical History  Procedure Laterality Date  . Appendectomy  1953  . Partial hysterectomy  1972  . Cholecystectomy  1993  . Back surgery      x2  . Cataract extraction    . Cardiac catheterization  10/10/2008    Nonobstructive CAD  . Abdominal hysterectomy  1972    TAH  . Foot surgery    . Tonsillectomy    . Pubovag repair with sling    . Skin tag removal  2012    leg  . Cardioversion  04/13/2011    Procedure: CARDIOVERSION;  Surgeon: Laverda Page, MD;  Location: [facility];  Service: Cardiovascular;  Laterality: N/A;  . Eye surgery      /w IOL, post cataracts removed   . Mass biopsy Left 12/07/2013    Procedure: EXCISIONAL BIOPSY LEFT NECK MASS ;  Surgeon: Jerrell Belfast, MD;  Location: [facility];  Service: ENT;  Laterality: Left;    SOCIAL HISTORY: History   Social History  . Marital Status: Married    Spouse Name: N/A    Number of Children: 2  . Years of Education: N/A  Occupational History  . Not on file.   Social History Main Topics  . Smoking status: Never Smoker   . Smokeless tobacco: Never Used  . Alcohol Use: No  . Drug Use: No  . Sexual Activity: Not Currently    Birth Control/ Protection: Surgical   Other Topics Concern  . Not on file   Social History Narrative  . No narrative on file    FAMILY HISTORY: Family History  Problem Relation Age of Onset  . Stroke Father     at 69  . Heart attack Father     at 50  . Hypertension Father   . Heart disease Mother   . Arthritis Mother   . Breast cancer Mother     Age 68  . Heart failure Mother   . Heart attack Brother   . Hypertension Brother   . Diabetes Sister     ALLERGIES:  is allergic to demerol;  meperidine hcl; morphine and related; multaq; oysters; penicillins; and sulfa drugs cross reactors.  MEDICATIONS:  Current Outpatient Prescriptions  Medication Sig Dispense Refill  . acetaminophen (TYLENOL) 500 MG tablet Take 1,000 mg by mouth at bedtime as needed (pain).      Marland Kitchen albuterol (PROAIR HFA) 108 (90 BASE) MCG/ACT inhaler Inhale 2 puffs into the lungs every 6 (six) hours as needed for wheezing or shortness of breath.      . Cholecalciferol (VITAMIN D) 2000 UNITS tablet Take 2,000 Units by mouth daily with lunch.      . conjugated estrogens (PREMARIN) vaginal cream Place 1 Applicatorful vaginally 3 (three) times a week. Monday, Wednesday and Friday nights      . dicyclomine (BENTYL) 10 MG capsule Take 10 mg by mouth daily as needed (ibs flare up).       . digoxin (LANOXIN) 0.125 MG tablet Take 125 mcg by mouth daily.       Marland Kitchen diltiazem (CARDIZEM CD) 120 MG 24 hr capsule Take 120 mg by mouth daily.       Marland Kitchen ezetimibe (ZETIA) 10 MG tablet Take 5 mg by mouth daily with supper.       . Fluticasone-Salmeterol (ADVAIR) 250-50 MCG/DOSE AEPB Inhale 1 puff into the lungs every 12 (twelve) hours.       . hydrochlorothiazide (HYDRODIURIL) 25 MG tablet Take 25 mg by mouth daily as needed (ankle swelling).       Marland Kitchen levothyroxine (SYNTHROID, LEVOTHROID) 125 MCG tablet Take 250 mcg by mouth daily before breakfast.       . Magnesium Oxide 500 MG TABS Take 500 mg by mouth at bedtime.      . metFORMIN (GLUCOPHAGE) 500 MG tablet Take 500 mg by mouth 2 (two) times daily with a meal.      . montelukast (SINGULAIR) 10 MG tablet Take 10 mg by mouth daily.      Marland Kitchen omega-3 acid ethyl esters (LOVAZA) 1 G capsule Take 1 g by mouth 2 (two) times daily.      . potassium chloride SA (K-DUR,KLOR-CON) 20 MEQ tablet Take 1 tablet (20 mEq total) by mouth daily.  30 tablet  5  . pravastatin (PRAVACHOL) 40 MG tablet Take 20 mg by mouth See admin instructions. Take 1/2 tablet (20 mg) on Monday, Tuesday, Wednesday and Thursday       . traMADol-acetaminophen (ULTRACET) 37.5-325 MG per tablet Take 1 tablet by mouth every 6 (six) hours as needed.  30 tablet  0  . warfarin (COUMADIN) 5 MG tablet Take 2.5-5  mg by mouth daily. Take 1/2 tablet (2.5 mg) on Tuesday and Thursday, take 1 tablet (5 mg) on Monday, Wednesday, Friday, Saturday and Sunday       No current facility-administered medications for this visit.    REVIEW OF SYSTEMS:   Constitutional: Denies fevers, chills Eyes: Denies blurriness of vision, double vision or watery eyes Ears, nose, mouth, throat, and face: Denies mucositis or sore throat Respiratory: Denies cough, dyspnea or wheezes Cardiovascular: Denies palpitation, chest discomfort or lower extremity swelling Gastrointestinal:  Denies nausea, heartburn or change in bowel habits Skin: Denies abnormal skin rashes Neurological:Denies numbness, tingling or new weaknesses Behavioral/Psych: Mood is stable, no new changes  All other systems were reviewed with the patient and are negative.  PHYSICAL EXAMINATION: ECOG PERFORMANCE STATUS: 1 - Symptomatic but completely ambulatory  Filed Vitals:   12/28/13 1122  BP: 135/55  Pulse: 77  Temp: 98 F (36.7 C)  Resp: 18   Filed Weights   12/28/13 1122  Weight: 137 lb 9.6 oz (62.415 kg)    GENERAL:alert, no distress and comfortable SKIN: skin color, texture, turgor are normal, no rashes or significant lesions EYES: normal, conjunctiva are pink and non-injected, sclera clear OROPHARYNX:no exudate, no erythema and lips, buccal mucosa, and tongue normal  NECK: supple, thyroid normal size, non-tender, without nodularity. Well-healed surgical scar LYMPH: She has palpable lymphadenopathy in the neck, left axilla and the right groin region. LUNGS: clear to auscultation and percussion with normal breathing effort HEART: regular rate & rhythm and no murmurs and no lower extremity edema ABDOMEN:abdomen soft, non-tender and normal bowel sounds. Spleen is not  enlarged Musculoskeletal:no cyanosis of digits and no clubbing  PSYCH: alert & oriented x 3 with fluent speech NEURO: no focal motor/sensory deficits  LABORATORY DATA:  I have reviewed the data as listed Lab Results  Component Value Date   WBC 7.0 11/26/2013   HGB 10.7* 11/26/2013   HCT 33.4* 11/26/2013   MCV 84.1 11/26/2013   PLT 177 11/26/2013    Recent Labs  11/26/13 1405  NA 138  K 4.4  CL 100  CO2 26  GLUCOSE 102*  BUN 16  CREATININE 0.55  CALCIUM 10.0  GFRNONAA 89*  GFRAA >90    RADIOGRAPHIC STUDIES: I review her imaging study. I have personally reviewed the radiological images as listed and agreed with the findings in the report.  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 She has PET CT scan scheduled for proper staging and review of case at the next tumor board. I recommend completion of staging with bone marrow aspirate and biopsy. She will need systemic chemotherapy. I will schedule echocardiogram, port placement and chemotherapy education class. The patient is on chronic anticoagulation therapy. We will make sure touch base with her cardiologist to see if she can be off warfarin for several days prior to the procedure. Depending on the results echocardiogram, I felt that she that bendamustine with rituximab would be the most appropriate choice of treatment for her. I do not want to put her at risk of cardiomyopathy with R CHOP chemotherapy.  Anemia in neoplastic disease This is likely anemia of chronic disease. The patient denies recent history of bleeding such as epistaxis, hematuria or hematochezia. She is asymptomatic from the anemia. We will observe for now.  She does not require transfusion now.  Orders Placed This Encounter  Procedures  . IR Fluoro Guide CV Line Right    Port    Standing Status: Future      Number of Occurrences:      Standing Expiration Date: 02/28/2015    Order Specific Question:  Reason for exam:    Answer:  port for chemo, on warfarin    Order Specific Question:  Preferred Imaging Location?    Answer:  [facility]  . CBC with Differential    Standing Status: Future     Number of Occurrences:      Standing Expiration Date: 02/01/2015  . Comprehensive metabolic panel    Standing Status: Future     Number of Occurrences:      Standing Expiration Date: 02/01/2015  . Lactate dehydrogenase    Standing Status: Future     Number of Occurrences:      Standing Expiration Date: 02/01/2015  . Hepatitis B core antibody, IgM    Standing Status: Future     Number of Occurrences:      Standing Expiration Date: 02/01/2015  . Hepatitis B surface antibody    Standing Status: Future     Number of Occurrences:      Standing Expiration Date: 02/01/2015  . Hepatitis B surface antigen    Standing Status: Future     Number of Occurrences:      Standing Expiration Date: 02/01/2015  . 2D Echocardiogram without contrast    Standing Status: Future     Number of Occurrences:      Standing Expiration Date: 12/28/2014    Order Specific Question:  Type of Echo    Answer:  Limited    Order Specific Question:  Reason for Exam    Answer:  Evaluate EF    Order Specific Question:  Where should this test be performed    Answer:  [facility]    All questions were answered. The patient knows to call the clinic with any problems, questions or concerns. I spent 40 minutes counseling the patient face to face. The total time spent in the appointment was 60 minutes and more than 50% was on counseling.     [place], [place], MD 12/28/2013 4:14 PM

## 2013-12-28 NOTE — Progress Notes (Signed)
Checked in new pt with no financial concerns at this time.  Pt has 2 insurances so financial assistance may not be needed.  Pt has my card for any questions or concerns.

## 2013-12-28 NOTE — Assessment & Plan Note (Signed)
This is likely anemia of chronic disease. The patient denies recent history of bleeding such as epistaxis, hematuria or hematochezia. She is asymptomatic from the anemia. We will observe for now.  She does not require transfusion now.   

## 2013-12-28 NOTE — Assessment & Plan Note (Signed)
Clinically, she has at least stage IIIB disease with probable lymphadenopathy in her neck and groin. She has PET CT scan scheduled for proper staging and review of case at the next tumor board. I recommend completion of staging with bone marrow aspirate and biopsy. She will need systemic chemotherapy. I will schedule echocardiogram, port placement and chemotherapy education class. The patient is on chronic anticoagulation therapy. We will make sure touch base with her cardiologist to see if she can be off warfarin for several days prior to the procedure. Depending on the results echocardiogram, I felt that she that bendamustine with rituximab would be the most appropriate choice of treatment for her. I do not want to put her at risk of cardiomyopathy with R CHOP chemotherapy.

## 2013-12-30 DIAGNOSIS — J189 Pneumonia, unspecified organism: Secondary | ICD-10-CM

## 2013-12-30 HISTORY — DX: Pneumonia, unspecified organism: J18.9

## 2013-12-30 HISTORY — PX: PORTACATH PLACEMENT: SHX2246

## 2013-12-31 ENCOUNTER — Telehealth: Payer: Self-pay | Admitting: Radiation Oncology

## 2013-12-31 ENCOUNTER — Encounter: Payer: Self-pay | Admitting: Radiation Oncology

## 2013-12-31 ENCOUNTER — Encounter (HOSPITAL_COMMUNITY)
Admission: RE | Admit: 2013-12-31 | Discharge: 2013-12-31 | Disposition: A | Discharge status: Home / Self Care | Payer: Medicare Other | Source: Ambulatory Visit | Attending: Radiation Oncology | Admitting: Radiation Oncology

## 2013-12-31 ENCOUNTER — Encounter: Payer: Self-pay | Admitting: Hematology and Oncology

## 2013-12-31 DIAGNOSIS — C8591 Non-Hodgkin lymphoma, unspecified, lymph nodes of head, face, and neck: Secondary | ICD-10-CM | POA: Diagnosis not present

## 2013-12-31 DIAGNOSIS — R599 Enlarged lymph nodes, unspecified: Secondary | ICD-10-CM | POA: Diagnosis not present

## 2013-12-31 MED ORDER — FLUDEOXYGLUCOSE F - 18 (FDG) INJECTION
6.4800 | Freq: Once | INTRAVENOUS | Status: AC | PRN
  Administered 2013-12-31: 6.48 via INTRAVENOUS

## 2013-12-31 NOTE — Progress Notes (Signed)
I let [name] know the extent of her disease from her PET results.  She understands that chemotherapy will be the cornerstone of her treatment, and no RT, unless Dr. Alvy Bimler finds a reason to refer her back to me in the future.  -----------------------------------  Patty Gibson, MD

## 2014-01-01 ENCOUNTER — Telehealth: Payer: Self-pay | Admitting: *Deleted

## 2014-01-01 LAB — GLUCOSE, CAPILLARY: GLUCOSE-CAPILLARY: 132 mg/dL — AB (ref 70–99)

## 2014-01-01 NOTE — Telephone Encounter (Signed)
See documentation note from 12/31/13

## 2014-01-01 NOTE — Telephone Encounter (Signed)
Called patient to notify that it is not necessary for her to come to the [facility] tomorrow afternoon per conversation with Dr. Alvy Bimler.  LVM with this information, asked her to call me.  Gayleen Orem, RN, BSN, [place] [facility] [phone]

## 2014-01-02 ENCOUNTER — Encounter: Payer: Medicare Other | Admitting: Nutrition

## 2014-01-02 ENCOUNTER — Ambulatory Visit: Payer: Medicare Other | Admitting: Physical Therapy

## 2014-01-02 ENCOUNTER — Telehealth: Payer: Self-pay | Admitting: *Deleted

## 2014-01-02 NOTE — Telephone Encounter (Signed)
Reminded pt of BMBX tomorrow morning.  Arrive at 8 am for 8;30 am BMBx. She verbalized understanding.

## 2014-01-03 ENCOUNTER — Ambulatory Visit (HOSPITAL_BASED_OUTPATIENT_CLINIC_OR_DEPARTMENT_OTHER): Payer: Medicare Other | Admitting: Hematology and Oncology

## 2014-01-03 ENCOUNTER — Telehealth: Payer: Self-pay | Admitting: Hematology and Oncology

## 2014-01-03 ENCOUNTER — Other Ambulatory Visit (HOSPITAL_COMMUNITY)
Admission: RE | Admit: 2014-01-03 | Discharge: 2014-01-03 | Disposition: A | Discharge status: Home / Self Care | Payer: Medicare Other | Source: Ambulatory Visit | Attending: Hematology and Oncology | Admitting: Hematology and Oncology

## 2014-01-03 ENCOUNTER — Other Ambulatory Visit: Payer: Medicare Other

## 2014-01-03 ENCOUNTER — Other Ambulatory Visit: Payer: Self-pay | Admitting: Radiology

## 2014-01-03 ENCOUNTER — Other Ambulatory Visit (HOSPITAL_BASED_OUTPATIENT_CLINIC_OR_DEPARTMENT_OTHER): Payer: Medicare Other

## 2014-01-03 VITALS — BP 134/66 | HR 81 | Temp 97.9°F | Resp 20

## 2014-01-03 DIAGNOSIS — C8581 Other specified types of non-Hodgkin lymphoma, lymph nodes of head, face, and neck: Secondary | ICD-10-CM | POA: Diagnosis not present

## 2014-01-03 DIAGNOSIS — C8591 Non-Hodgkin lymphoma, unspecified, lymph nodes of head, face, and neck: Secondary | ICD-10-CM

## 2014-01-03 DIAGNOSIS — D649 Anemia, unspecified: Secondary | ICD-10-CM | POA: Diagnosis not present

## 2014-01-03 LAB — COMPREHENSIVE METABOLIC PANEL (CC13)
ALT: 15 U/L (ref 0–55)
ANION GAP: 9 meq/L (ref 3–11)
AST: 14 U/L (ref 5–34)
Albumin: 4 g/dL (ref 3.5–5.0)
Alkaline Phosphatase: 79 U/L (ref 40–150)
BILIRUBIN TOTAL: 0.57 mg/dL (ref 0.20–1.20)
BUN: 14.5 mg/dL (ref 7.0–26.0)
CALCIUM: 10.1 mg/dL (ref 8.4–10.4)
CHLORIDE: 103 meq/L (ref 98–109)
CO2: 27 meq/L (ref 22–29)
Creatinine: 0.8 mg/dL (ref 0.6–1.1)
GLUCOSE: 129 mg/dL (ref 70–140)
Potassium: 4.7 mEq/L (ref 3.5–5.1)
Sodium: 139 mEq/L (ref 136–145)
Total Protein: 7 g/dL (ref 6.4–8.3)

## 2014-01-03 LAB — CBC WITH DIFFERENTIAL/PLATELET
BASO%: 0.6 % (ref 0.0–2.0)
BASOS ABS: 0 10*3/uL (ref 0.0–0.1)
EOS ABS: 0.2 10*3/uL (ref 0.0–0.5)
EOS%: 3.3 % (ref 0.0–7.0)
HEMATOCRIT: 33.6 % — AB (ref 34.8–46.6)
HEMOGLOBIN: 10.9 g/dL — AB (ref 11.6–15.9)
LYMPH#: 0.7 10*3/uL — AB (ref 0.9–3.3)
LYMPH%: 12.1 % — ABNORMAL LOW (ref 14.0–49.7)
MCH: 27 pg (ref 25.1–34.0)
MCHC: 32.3 g/dL (ref 31.5–36.0)
MCV: 83.5 fL (ref 79.5–101.0)
MONO#: 0.6 10*3/uL (ref 0.1–0.9)
MONO%: 10.3 % (ref 0.0–14.0)
NEUT#: 4.5 10*3/uL (ref 1.5–6.5)
NEUT%: 73.7 % (ref 38.4–76.8)
Platelets: 193 10*3/uL (ref 145–400)
RBC: 4.02 10*6/uL (ref 3.70–5.45)
RDW: 15.1 % — ABNORMAL HIGH (ref 11.2–14.5)
WBC: 6.1 10*3/uL (ref 3.9–10.3)

## 2014-01-03 LAB — HEPATITIS B CORE ANTIBODY, IGM: HEP B C IGM: NONREACTIVE

## 2014-01-03 LAB — LACTATE DEHYDROGENASE (CC13): LDH: 193 U/L (ref 125–245)

## 2014-01-03 LAB — HEPATITIS B SURFACE ANTIGEN: HEP B S AG: NEGATIVE

## 2014-01-03 LAB — HEPATITIS B SURFACE ANTIBODY,QUALITATIVE

## 2014-01-03 LAB — BONE MARROW EXAM

## 2014-01-03 NOTE — Progress Notes (Signed)
6431 BMBX dressing CDI. After care instructions gone over with pt.  Pt discharged to go to lab and chemo education class.

## 2014-01-03 NOTE — Patient Instructions (Signed)
Bone Marrow Aspiration, Bone Marrow Biopsy  Care After  Read the instructions outlined below and refer to this sheet in the next few weeks. These discharge instructions provide you with general information on caring for yourself after you leave the hospital. Your caregiver may also give you specific instructions. While your treatment has been planned according to the most current medical practices available, unavoidable complications occasionally occur. If you have any problems or questions after discharge, call your caregiver.  FINDING OUT THE RESULTS OF YOUR TEST  Not all test results are available during your visit. If your test results are not back during the visit, make an appointment with your caregiver to find out the results. Do not assume everything is normal if you have not heard from your caregiver or the medical facility. It is important for you to follow up on all of your test results.   HOME CARE INSTRUCTIONS   You have had sedation and may be sleepy or dizzy. Your thinking may not be as clear as usual. For the next 24 hours:  · Only take over-the-counter or prescription medicines for pain, discomfort, and or fever as directed by your caregiver.  · Do not drink alcohol.  · Do not smoke.  · Do not drive.  · Do not make important legal decisions.  · Do not operate heavy machinery.  · Do not care for small children by yourself.  · Keep your dressing clean and dry. You may replace dressing with a bandage after 24 hours.  · You may take a bath or shower after 24 hours.  · Use an ice pack for 20 minutes every 2 hours while awake for pain as needed.  SEEK MEDICAL CARE IF:   · There is redness, swelling, or increasing pain at the biopsy site.  · There is pus coming from the biopsy site.  · There is drainage from a biopsy site lasting longer than one day.  · An unexplained oral temperature above 102° F (38.9° C) develops.  SEEK IMMEDIATE MEDICAL CARE IF:   · You develop a rash.  · You have difficulty  breathing.  · You develop any reaction or side effects to medications given.  Document Released: 09/04/2004 Document Revised: 05/10/2011 Document Reviewed: 02/13/2008  ExitCare® Patient Information ©2015 [employer]. This information is not intended to replace advice given to you by your health care provider. Make sure you discuss any questions you have with your health care provider.

## 2014-01-03 NOTE — Progress Notes (Signed)
Overall, she feels well. She has multiple appointments pending with anticipated treatment next week.  Bone Marrow Biopsy and Aspiration Procedure Note   Informed consent was obtained and potential risks including bleeding, infection and pain were reviewed with the patient.  The patient's name, date of birth, identification, consent and allergies were verified prior to the start of procedure and time out was performed.  The right posterior iliac crest was chosen as the site of biopsy.  The skin was prepped with Betadine solution.   5 cc of 1% lidocaine was used to provide local anaesthesia.   10 cc of bone marrow aspirate was obtained followed by 1 inch biopsy.   The procedure was tolerated well and there were no complications.  The patient was stable at the end of the procedure.  Specimens sent for flow cytometry, cytogenetics and additional studies.

## 2014-01-03 NOTE — Telephone Encounter (Signed)
lvm for pt regarding to appt time moved on 11.10

## 2014-01-04 ENCOUNTER — Ambulatory Visit (HOSPITAL_COMMUNITY)
Admission: RE | Admit: 2014-01-04 | Discharge: 2014-01-04 | Disposition: A | Discharge status: Home / Self Care | Payer: Medicare Other | Source: Ambulatory Visit | Attending: Hematology and Oncology | Admitting: Hematology and Oncology

## 2014-01-04 ENCOUNTER — Other Ambulatory Visit: Payer: Self-pay | Admitting: *Deleted

## 2014-01-04 ENCOUNTER — Encounter (HOSPITAL_COMMUNITY): Payer: Self-pay

## 2014-01-04 ENCOUNTER — Other Ambulatory Visit: Payer: Self-pay | Admitting: Hematology and Oncology

## 2014-01-04 ENCOUNTER — Encounter: Payer: Self-pay | Admitting: Internal Medicine

## 2014-01-04 DIAGNOSIS — E039 Hypothyroidism, unspecified: Secondary | ICD-10-CM | POA: Diagnosis not present

## 2014-01-04 DIAGNOSIS — I482 Chronic atrial fibrillation: Secondary | ICD-10-CM | POA: Diagnosis not present

## 2014-01-04 DIAGNOSIS — C8591 Non-Hodgkin lymphoma, unspecified, lymph nodes of head, face, and neck: Secondary | ICD-10-CM

## 2014-01-04 DIAGNOSIS — E1140 Type 2 diabetes mellitus with diabetic neuropathy, unspecified: Secondary | ICD-10-CM | POA: Insufficient documentation

## 2014-01-04 DIAGNOSIS — I4891 Unspecified atrial fibrillation: Secondary | ICD-10-CM | POA: Diagnosis not present

## 2014-01-04 DIAGNOSIS — I10 Essential (primary) hypertension: Secondary | ICD-10-CM | POA: Insufficient documentation

## 2014-01-04 DIAGNOSIS — J45909 Unspecified asthma, uncomplicated: Secondary | ICD-10-CM | POA: Insufficient documentation

## 2014-01-04 DIAGNOSIS — K219 Gastro-esophageal reflux disease without esophagitis: Secondary | ICD-10-CM | POA: Insufficient documentation

## 2014-01-04 DIAGNOSIS — Z7901 Long term (current) use of anticoagulants: Secondary | ICD-10-CM | POA: Diagnosis not present

## 2014-01-04 DIAGNOSIS — E785 Hyperlipidemia, unspecified: Secondary | ICD-10-CM | POA: Diagnosis not present

## 2014-01-04 DIAGNOSIS — R0600 Dyspnea, unspecified: Secondary | ICD-10-CM | POA: Diagnosis not present

## 2014-01-04 DIAGNOSIS — C8240 Follicular lymphoma grade IIIb, unspecified site: Secondary | ICD-10-CM | POA: Insufficient documentation

## 2014-01-04 LAB — CBC WITH DIFFERENTIAL/PLATELET
BASOS PCT: 0 % (ref 0–1)
Basophils Absolute: 0 10*3/uL (ref 0.0–0.1)
EOS ABS: 0.2 10*3/uL (ref 0.0–0.7)
Eosinophils Relative: 3 % (ref 0–5)
HCT: 32.4 % — ABNORMAL LOW (ref 36.0–46.0)
Hemoglobin: 10.4 g/dL — ABNORMAL LOW (ref 12.0–15.0)
Lymphocytes Relative: 17 % (ref 12–46)
Lymphs Abs: 1 10*3/uL (ref 0.7–4.0)
MCH: 27.3 pg (ref 26.0–34.0)
MCHC: 32.1 g/dL (ref 30.0–36.0)
MCV: 85 fL (ref 78.0–100.0)
Monocytes Absolute: 0.7 10*3/uL (ref 0.1–1.0)
Monocytes Relative: 11 % (ref 3–12)
NEUTROS PCT: 69 % (ref 43–77)
Neutro Abs: 4.3 10*3/uL (ref 1.7–7.7)
PLATELETS: 178 10*3/uL (ref 150–400)
RBC: 3.81 MIL/uL — AB (ref 3.87–5.11)
RDW: 14.1 % (ref 11.5–15.5)
WBC: 6.2 10*3/uL (ref 4.0–10.5)

## 2014-01-04 LAB — PROTIME-INR
INR: 1.24 (ref 0.00–1.49)
PROTHROMBIN TIME: 15.7 s — AB (ref 11.6–15.2)

## 2014-01-04 LAB — APTT: aPTT: 30 seconds (ref 24–37)

## 2014-01-04 LAB — GLUCOSE, CAPILLARY: Glucose-Capillary: 99 mg/dL (ref 70–99)

## 2014-01-04 MED ORDER — FENTANYL CITRATE 0.05 MG/ML IJ SOLN
INTRAMUSCULAR | Status: AC
  Filled 2014-01-04: qty 6

## 2014-01-04 MED ORDER — LIDOCAINE HCL 1 % IJ SOLN
INTRAMUSCULAR | Status: AC
Start: 2014-01-04 — End: 2014-01-05
  Filled 2014-01-04: qty 20

## 2014-01-04 MED ORDER — MIDAZOLAM HCL 2 MG/2ML IJ SOLN
INTRAMUSCULAR | Status: AC
  Filled 2014-01-04: qty 6

## 2014-01-04 MED ORDER — SODIUM CHLORIDE 0.9 % IV SOLN
INTRAVENOUS | Status: DC
  Administered 2014-01-04: 12:00:00 via INTRAVENOUS

## 2014-01-04 MED ORDER — LIDOCAINE-PRILOCAINE 2.5-2.5 % EX CREA
1.0000 | TOPICAL_CREAM | CUTANEOUS | Status: DC | PRN
Start: 2014-01-04 — End: 2015-06-18

## 2014-01-04 MED ORDER — VANCOMYCIN HCL IN DEXTROSE 1-5 GM/200ML-% IV SOLN
1000.0000 mg | INTRAVENOUS | Status: AC
  Administered 2014-01-04: 1000 mg via INTRAVENOUS
  Filled 2014-01-04: qty 200

## 2014-01-04 MED ORDER — HEPARIN SOD (PORK) LOCK FLUSH 100 UNIT/ML IV SOLN
INTRAVENOUS | Status: AC
Start: 2014-01-04 — End: 2014-01-05
  Filled 2014-01-04: qty 5

## 2014-01-04 MED ORDER — FENTANYL CITRATE 0.05 MG/ML IJ SOLN
INTRAMUSCULAR | Status: AC | PRN
  Administered 2014-01-04 (×2): 25 ug via INTRAVENOUS

## 2014-01-04 MED ORDER — MIDAZOLAM HCL 2 MG/2ML IJ SOLN
INTRAMUSCULAR | Status: AC | PRN
  Administered 2014-01-04: 1 mg via INTRAVENOUS
  Administered 2014-01-04: 0.5 mg via INTRAVENOUS

## 2014-01-04 MED ORDER — HEPARIN SOD (PORK) LOCK FLUSH 100 UNIT/ML IV SOLN
INTRAVENOUS | Status: AC | PRN
  Administered 2014-01-04: 500 [IU]

## 2014-01-04 NOTE — H&P (Signed)
Chief Complaint: "I'm getting a port a cath"  Referring Physician(s): Gorsuch,Ni  History of Present Illness: Patty [name] is a 73 y.o. female with history of stage IIIB high grade follicular lymphoma. She presents today for port a cath placement for chemotherapy.  Past Medical History  Diagnosis Date  . Asthma 06/15/2010  . Hypothyroidism 06/15/2010  . Hyperlipemia 06/15/2010  . Atrial fibrillation 05/28/2010    Managed with rate control and coumadin  . Long-term (current) use of anticoagulants   . ACE-inhibitor cough   . DI (detrusor instability)   . Fibroid   . Rectocele   . Atrophic vaginitis   . 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 Diabetes mellitus     Type 2  . Peripheral neuropathy   . Hypertension   . Dysrhythmia     cardioversion - 2012  . Complication of anesthesia 1980's    post anesth.- states she "went into resp. arrest" , but then remarked that she thought maybe they gave her too much medicine (morphine)   . Shortness of breath     sometimes   . 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 Anemia   . Malignant lymphoma, follicular 53/9/76  . CPAP (continuous positive airway pressure) dependence   . GERD (gastroesophageal reflux disease)   . Hearing loss   . Sleep apnea     uses CPAP everynight- last study in [place] 5 yrs. or more     Past Surgical History  Procedure Laterality Date  . Appendectomy  1953  . Partial hysterectomy  1972  . Cholecystectomy  1993  . Back surgery      x2  . Cataract extraction    . Cardiac catheterization  10/10/2008    Nonobstructive CAD  . Abdominal hysterectomy  1972    TAH  . Foot surgery    . Tonsillectomy    . Pubovag repair with sling    . Skin tag removal  2012    leg  . Cardioversion  04/13/2011    Procedure: CARDIOVERSION;  Surgeon: Laverda Page, MD;  Location: [facility];  Service: Cardiovascular;  Laterality: N/A;  . Eye surgery      /w IOL, post cataracts removed   . Mass biopsy Left 12/07/2013      Procedure: EXCISIONAL BIOPSY LEFT NECK MASS ;  Surgeon: Jerrell Belfast, MD;  Location: [facility];  Service: ENT;  Laterality: Left;    Allergies: Demerol; Meperidine hcl; Morphine and related; Multaq; Oysters; Penicillins; and Sulfa drugs cross reactors  Medications: Prior to Admission medications   Medication Sig Start Date End Date Taking? Authorizing Provider  acetaminophen (TYLENOL) 500 MG tablet Take 1,000 mg by mouth at bedtime as needed (pain).    Historical Provider, MD  albuterol (PROAIR HFA) 108 (90 BASE) MCG/ACT inhaler Inhale 2 puffs into the lungs every 6 (six) hours as needed for wheezing or shortness of breath.    Historical Provider, MD  Cholecalciferol (VITAMIN D) 2000 UNITS tablet Take 2,000 Units by mouth daily with lunch.    Historical Provider, MD  conjugated estrogens (PREMARIN) vaginal cream Place 1 Applicatorful vaginally 3 (three) times a week. Monday, Wednesday and Friday nights    Historical Provider, MD  dicyclomine (BENTYL) 10 MG capsule Take 10 mg by mouth daily as needed (ibs flare up).     Historical Provider, MD  digoxin (LANOXIN) 0.125 MG tablet Take 125 mcg by mouth daily.  06/07/11   Peter M Martinique,  MD  diltiazem (CARDIZEM CD) 120 MG 24 hr capsule Take 120 mg by mouth daily.  06/07/11   Peter M Martinique, MD  ezetimibe (ZETIA) 10 MG tablet Take 5 mg by mouth daily with supper.     Historical Provider, MD  Fluticasone-Salmeterol (ADVAIR) 250-50 MCG/DOSE AEPB Inhale 1 puff into the lungs every 12 (twelve) hours.     Historical Provider, MD  hydrochlorothiazide (HYDRODIURIL) 25 MG tablet Take 25 mg by mouth daily as needed (ankle swelling).     Historical Provider, MD  levothyroxine (SYNTHROID, LEVOTHROID) 125 MCG tablet Take 250 mcg by mouth daily before breakfast.     Historical Provider, MD  Magnesium Oxide 500 MG TABS Take 500 mg by mouth at bedtime.    Historical Provider, MD  metFORMIN (GLUCOPHAGE) 500 MG tablet Take 500 mg by mouth 2 (two) times daily with a  meal.    Historical Provider, MD  montelukast (SINGULAIR) 10 MG tablet Take 10 mg by mouth daily.    Historical Provider, MD  omega-3 acid ethyl esters (LOVAZA) 1 G capsule Take 1 g by mouth 2 (two) times daily.    Historical Provider, MD  potassium chloride SA (K-DUR,KLOR-CON) 20 MEQ tablet Take 1 tablet (20 mEq total) by mouth daily. 12/22/11   Peter M Martinique, MD  pravastatin (PRAVACHOL) 40 MG tablet Take 20 mg by mouth See admin instructions. Take 1/2 tablet (20 mg) on Monday, Tuesday, Wednesday and Thursday    Historical Provider, MD  traMADol-acetaminophen (ULTRACET) 37.5-325 MG per tablet Take 1 tablet by mouth every 6 (six) hours as needed. 12/07/13   Jerrell Belfast, MD  warfarin (COUMADIN) 5 MG tablet Take 2.5-5 mg by mouth daily. Take 1/2 tablet (2.5 mg) on Tuesday and Thursday, take 1 tablet (5 mg) on Monday, Wednesday, Friday, Saturday and Sunday 12/31/10   Peter M Martinique, MD    Family History  Problem Relation Age of Onset  . Stroke Father     at 66  . Heart attack Father     at 38  . Hypertension Father   . Heart disease Mother   . Arthritis Mother   . Breast cancer Mother     Age 73  . Heart failure Mother   . Heart attack Brother   . Hypertension Brother   . Diabetes Sister     History   Social History  . Marital Status: Married    Spouse Name: N/A    Number of Children: 2  . Years of Education: N/A   Social History Main Topics  . Smoking status: Never Smoker   . Smokeless tobacco: Never Used  . Alcohol Use: No  . Drug Use: No  . Sexual Activity: Not Currently    Birth Control/ Protection: Surgical   Other Topics Concern  . Not on file   Social History Narrative         Review of Systems  Constitutional: Positive for fatigue and unexpected weight change. Negative for fever and chills.       Occ night sweats  Respiratory: Negative for cough.        Occ dyspnea with exertion  Cardiovascular: Negative for chest pain.  Gastrointestinal: Negative  for nausea, vomiting, abdominal pain and blood in stool.  Genitourinary: Negative for dysuria and hematuria.  Musculoskeletal:       Occ back pain  Neurological: Negative for headaches.    Vital Signs: BP 133/62 mmHg  Pulse 76  Temp(Src) 98 F (36.7 C) (Oral)  Resp  18  SpO2 99%  Physical Exam  Constitutional: She is oriented to person, place, and time.  Thin elderly WF in NAD  Cardiovascular: Normal rate and regular rhythm.   Pulmonary/Chest: Effort normal.  Sl dim BS left base, right clear  Abdominal: Soft. Bowel sounds are normal. There is no tenderness.  Musculoskeletal: Normal range of motion. She exhibits no edema.  Lymphadenopathy:    She has cervical adenopathy.  Neurological: She is alert and oriented to person, place, and time.    Imaging: Nm Pet Image Initial (pi) Skull Base To Thigh  12/31/2013   CLINICAL DATA:  Initial treatment strategy for lymphoma. Staging examination.  EXAM: NUCLEAR MEDICINE PET SKULL BASE TO THIGH  TECHNIQUE: 6.5 mCi F-18 FDG was injected intravenously. Full-ring PET imaging was performed from the skull base to thigh after the radiotracer. CT data was obtained and used for attenuation correction and anatomic localization.  FASTING BLOOD GLUCOSE:  Value: 132 mg/dl  COMPARISON:  CT of the abdomen and pelvis 07/01/2010.  FINDINGS: NECK  2.1 x 0.7 cm hypermetabolic (SUVmax = 3.1)VQMG level IIb lymph node (image 26 of series 4).  CHEST  Anterior nodal lesion, likely along the left internal mammary nodal chain measuring 1.8 cm in short axis is hypermetabolic (SUVmax = 6.8). Other anterior mediastinal lymph nodes are noted more inferiorly, largest of which is immediately anterior to the right ventricle of the heart measuring up to 4.4 x 12.3 cm (SUVmax = 7.1). 1.3 cm short axis hypermetabolic subcarinal node (SUVmax = 5.8). Multiple borderline enlarged bilateral axillary lymph nodes are hypermetabolic, with the largest of these measuring up to 9 mm in short  axis in the left axilla (SUVmax = 3.7). Small to moderate left pleural effusion layering dependently is simple in appearance. This is associated with some very mild passive atelectasis in the left lower lobe. No confluent consolidative airspace disease. No suspicious appearing pulmonary nodules or masses. Heart size is borderline enlarged. There is atherosclerosis of the thoracic aorta, the great vessels of the mediastinum and the coronary arteries, including calcified atherosclerotic plaque in the left main, left anterior descending, left circumflex and right coronary arteries.  ABDOMEN/PELVIS  There is a large ill-defined mass in the central aspect of the upper abdomen, in the root of the small bowel mesentery extending up into the porta hepatis, indistinguishable from adjacent structures including portions of the caudate lobe of the liver. Due to the infiltrative nature of this mass and lack of IV contrast on today's examination, accurate measurement is not possible, but this is estimated to measure at least 9.0 x 7.3 cm (likely a conglomerate of numerous enlarged lymph nodes), and is diffusely hypermetabolic (SUVmax = 8.8). Associated with this, there is diffuse haziness throughout the small bowel mesentery, presumably from edema and lymphatic congestion. Several other borderline enlarged and enlarged hypermetabolic lymph nodes are noted throughout the retroperitoneum and peritoneal cavity, including a low right para-aortic lymph node measuring 1cm (SUVmax = 4.5), multiple mesenteric lymph nodes (images 141 in the left upper quadrant, 136 in the right upper quadrant, and 129 in the right upper quadrant of series 4) are all hypermetabolic. No abnormal hypermetabolic activity within the pancreas, adrenal glands, or spleen. Spleen is borderline enlarged measuring up to 11.5 x 5.6 cm on axial images. There is also a focal area of colonic wall thickening in the splenic flexure (image 124 of series 4), which is  hypermetabolic (SUVmax = 7.3); whether or not this is physiologic activity, or a site of  colonic involvement is uncertain on today's examination. Small volume of ascites, predominantly in the cul-de-sac. In the pelvis there are multiple borderline enlarged and mildly enlarged lymph nodes lung pelvic side wall bilaterally measuring up to 1 cm on the left (SUVmax = 5.6)and 2.3 x 3.0 cm on the right in the external iliac nodal station (SUVmax = 8.4). Multiple enlarged and borderline enlarged bilateral inguinal lymph nodes are noted measuring up to 1.3 cm on the left side (SUVmax = 2.5). Extensive atherosclerosis throughout the abdominal and pelvic vasculature, without evidence of aneurysm.  SKELETON  No focal hypermetabolic activity to suggest skeletal metastasis.  IMPRESSION: 1. Extensive lymphadenopathy the throughout the neck, chest, abdomen and pelvis, as above, compatible with the patient's reported clinical history of lymphoma. The largest nodal mass is a conglomerate of lymph nodes which extends from the root of the small bowel mesentery into the porta hepatis. 2. Small to moderate left pleural effusion with minimal passive atelectasis in the left lower lobe. 3. Small volume of ascites, predominantly in the cul-de-sac. 4. Atherosclerosis, including left main and 3 vessel coronary artery disease. Assessment for potential risk factor modification, dietary therapy or pharmacologic therapy may be warranted, if clinically indicated. 5. Additional incidental findings, as above.   Electronically Signed   By: Vinnie Langton M.D.   On: 12/31/2013 10:08    Labs:  CBC:  Recent Labs  11/26/13 1405 01/03/14 0905 01/04/14 1200  WBC 7.0 6.1 6.2  HGB 10.7* 10.9* 10.4*  HCT 33.4* 33.6* 32.4*  PLT 177 193 178    COAGS:  Recent Labs  11/26/13 1405 12/07/13 0741 01/04/14 1200  INR 2.91* 1.24 1.24  APTT 43* 32 30    BMP:  Recent Labs  11/26/13 1405 01/03/14 0905  NA 138 139  K 4.4 4.7  CL 100   --   CO2 26 27  GLUCOSE 102* 129  BUN 16 14.5  CALCIUM 10.0 10.1  CREATININE 0.55 0.8  GFRNONAA 89*  --   GFRAA >90  --     LIVER FUNCTION TESTS:  Recent Labs  01/03/14 0905  BILITOT 0.57  AST 14  ALT 15  ALKPHOS 79  PROT 7.0  ALBUMIN 4.0    TUMOR MARKERS: No results for input(s): AFPTM, CEA, CA199, CHROMGRNA in the last 8760 hours.  Assessment and Plan: NURY NEBERGALL is a 73 y.o. female with history of stage IIIB high grade follicular lymphoma. She presents today for port a cath placement for chemotherapy. Details/risks of procedure d/w pt/granddaughter with their understanding and consent.            Signed: Autumn Messing 01/04/2014, 1:29 PM

## 2014-01-04 NOTE — Discharge Instructions (Signed)

## 2014-01-04 NOTE — Procedures (Signed)
Procedure:  Port-a-cath Access:  Right IJ vein SL Power port placed with tip at cavoatrial junction.  OK to use.  No PTX.

## 2014-01-04 NOTE — Progress Notes (Signed)
  Echocardiogram 2D Echocardiogram has been performed.  Patty [name] 01/04/2014, 10:53 AM

## 2014-01-08 ENCOUNTER — Encounter: Payer: Self-pay | Admitting: Hematology and Oncology

## 2014-01-08 ENCOUNTER — Ambulatory Visit (HOSPITAL_BASED_OUTPATIENT_CLINIC_OR_DEPARTMENT_OTHER): Payer: Medicare Other | Admitting: Hematology and Oncology

## 2014-01-08 ENCOUNTER — Telehealth: Payer: Self-pay | Admitting: Hematology and Oncology

## 2014-01-08 ENCOUNTER — Ambulatory Visit: Payer: Medicare Other | Admitting: Hematology and Oncology

## 2014-01-08 ENCOUNTER — Telehealth: Payer: Self-pay | Admitting: *Deleted

## 2014-01-08 ENCOUNTER — Other Ambulatory Visit: Payer: Self-pay | Admitting: Hematology and Oncology

## 2014-01-08 VITALS — BP 135/58 | HR 82 | Temp 98.3°F | Resp 18 | Ht 66.5 in | Wt 137.2 lb

## 2014-01-08 DIAGNOSIS — D630 Anemia in neoplastic disease: Secondary | ICD-10-CM

## 2014-01-08 DIAGNOSIS — C8591 Non-Hodgkin lymphoma, unspecified, lymph nodes of head, face, and neck: Secondary | ICD-10-CM | POA: Diagnosis not present

## 2014-01-08 DIAGNOSIS — Z9189 Other specified personal risk factors, not elsewhere classified: Secondary | ICD-10-CM

## 2014-01-08 DIAGNOSIS — I482 Chronic atrial fibrillation, unspecified: Secondary | ICD-10-CM

## 2014-01-08 DIAGNOSIS — I4891 Unspecified atrial fibrillation: Secondary | ICD-10-CM

## 2014-01-08 MED ORDER — ALLOPURINOL 300 MG PO TABS: 300.0000 mg | ORAL_TABLET | Freq: Every day | ORAL | Status: DC

## 2014-01-08 MED ORDER — PROCHLORPERAZINE MALEATE 10 MG PO TABS: 10.0000 mg | ORAL_TABLET | Freq: Four times a day (QID) | ORAL | Status: DC | PRN

## 2014-01-08 MED ORDER — ACYCLOVIR 400 MG PO TABS: 400.0000 mg | ORAL_TABLET | Freq: Every day | ORAL | Status: DC

## 2014-01-08 MED ORDER — ONDANSETRON HCL 8 MG PO TABS: 8.0000 mg | ORAL_TABLET | Freq: Three times a day (TID) | ORAL | Status: DC | PRN

## 2014-01-08 NOTE — Assessment & Plan Note (Signed)
We reviewed the most recent PET/CT scan, echo report and bone marrow result. Clinically, she has stage III disease. I reviewed with her the national guidelines in approach to follicular lymphoma low-grade versus high-grade. Due to her chronic history of atrial fibrillation, even in the absence of cardiomyopathy or congestive heart failure, I am concerned about prescribing adriamycin containing regimens. I shared with her the data using bendamustine & rituximab. Ultimately, I recommend to treat her using bendamustine with rituximab rather than R CHOP chemotherapy. The decision was made based on publication in the Blood: Randomized trial of bendamustine-rituximab or R-CHOP/R-CVP in first-line treatment of indolent NHL or MCL: the BRIGHT study.  [place] [address], Lucianne Lei der [address] [place] BS, [address] M, Kwan YL, [place] D, Craig M, [place] K, [place] S, [place], [place] DM, [place]unteanu M, Aram Beecham JM SO  Blood. 2014;123(19):2944.  The chemotherapy consists of   1. Bendamustine at 90 mg/m2 on day 1 & 2 2. Rituximab at 375 mg/m2 on day 1 Each cycle + 28 days  Plan for 6 cycles total with Neulasta support  In the international phase III BRIGHT trial, 447 previously untreated patients with advanced stage follicular (n = 341 patients), mantle cell (n = 74 patients), or other indolent lymphoma were randomly assigned to six cycles of BR according to the same dose and schedule described above or to R-CHOP or R-CVP. BR resulted in similar complete (31 versus 25 percent) and overall (97 versus 91 percent) response rates. BR was associated with higher rates of vomiting and drug hypersensitivity and lower rates of peripheral neuropathy/paresthesia and alopecia. The use of prophylactic antiemetics was not specified in the protocol and was more common among patients assigned to R-CHOP.   We discussed the role of chemotherapy. The intent is for cure.  We discussed some of the risks,  benefits and side-effects of Rituximab with Bendamustine.   Some of the short term side-effects included, though not limited to, risk of fatigue, weight loss, tumor lysis syndrome, risk of allergic reactions, pancytopenia, life-threatening infections, need for transfusions of blood products, nausea, vomiting, change in bowel habits, admission to hospital for various reasons, and risks of death.   Long term side-effects are also discussed including permanent damage to nerve function, chronic fatigue, and rare secondary malignancy including bone marrow disorders.   The patient is aware that the response rates discussed earlier is not guaranteed.    After a long discussion, patient made an informed decision to proceed with the prescribed plan of care.   Patient education material was dispensed

## 2014-01-08 NOTE — Assessment & Plan Note (Signed)
I recommend plenty of fluid hydration and allopurinol.

## 2014-01-08 NOTE — Telephone Encounter (Signed)
Per staff message and POF I have scheduled appts. Advised scheduler of appts. JMW  

## 2014-01-08 NOTE — Assessment & Plan Note (Signed)
This is likely anemia of chronic disease. The patient denies recent history of bleeding such as epistaxis, hematuria or hematochezia. She is asymptomatic from the anemia. We will observe for now.  She does not require transfusion now.   

## 2014-01-08 NOTE — Assessment & Plan Note (Signed)
Her most recent echocardiogram show preserved ejection fraction. I recommend close follow-up and INR monitoring with her PCP due to potential interaction between warfarin and chemotherapy.

## 2014-01-08 NOTE — Progress Notes (Signed)
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   01/03/2014 Bone Marrow Biopsy Accession: [id number] Bone marrow biopsy is negative   01/04/2014 Imaging ECHO showed normal EF   01/04/2014 Procedure She has placement of port    INTERVAL HISTORY: Please see below for problem oriented charting. She returns today to review test results and to discuss chemotherapy.  REVIEW OF SYSTEMS:   Constitutional: Denies fevers, chills or abnormal weight loss Eyes: Denies blurriness of vision Ears, nose, mouth, throat, and face: Denies mucositis or sore throat Respiratory: Denies cough, dyspnea or wheezes Cardiovascular: Denies palpitation, chest discomfort or lower extremity swelling Gastrointestinal:  Denies nausea, heartburn or change in bowel habits Skin: Denies abnormal skin rashes Lymphatics: Denies new lymphadenopathy or easy bruising Neurological:Denies numbness, tingling or new weaknesses Behavioral/Psych: Mood is stable, no new changes  All other systems were reviewed with the patient and are negative.  I have reviewed the past medical history, past  surgical history, social history and family history with the patient and they are unchanged from previous note.  ALLERGIES:  is allergic to demerol; meperidine hcl; morphine and related; multaq; oysters; penicillins; and sulfa drugs cross reactors.  MEDICATIONS:  Current Outpatient Prescriptions  Medication Sig Dispense Refill  . acetaminophen (TYLENOL) 500 MG tablet Take 1,000 mg by mouth at bedtime as needed (pain).    Marland Kitchen acyclovir (ZOVIRAX) 400 MG tablet Take 1 tablet (400 mg total) by mouth daily. 30 tablet 3  . albuterol (PROAIR HFA) 108 (90 BASE) MCG/ACT inhaler Inhale 2 puffs into the lungs every 6 (six) hours as needed for wheezing or shortness of breath.    . allopurinol (ZYLOPRIM) 300 MG tablet Take 1 tablet (300 mg total) by mouth daily. 30 tablet 3  . Cholecalciferol (VITAMIN D) 2000 UNITS tablet Take 2,000 Units by mouth daily with lunch.    . conjugated estrogens (PREMARIN) vaginal cream Place 1 Applicatorful vaginally 3 (three) times a week. Monday, Wednesday and Friday nights    . dicyclomine (BENTYL) 10 MG capsule Take 10 mg by mouth daily as needed (ibs flare up).     . digoxin (LANOXIN) 0.125 MG tablet Take 125 mcg by mouth daily.     Marland Kitchen diltiazem (CARDIZEM CD) 120 MG 24 hr capsule Take 120 mg by mouth daily.     Marland Kitchen ezetimibe (ZETIA) 10 MG tablet Take 5 mg by mouth daily with supper.     . Fluticasone-Salmeterol (ADVAIR) 250-50 MCG/DOSE AEPB Inhale 1 puff into the lungs every 12 (twelve) hours.     . hydrochlorothiazide (HYDRODIURIL) 25 MG tablet Take 25 mg by mouth daily as needed (ankle swelling).     Marland Kitchen  levothyroxine (SYNTHROID, LEVOTHROID) 125 MCG tablet Take 250 mcg by mouth daily before breakfast.     . lidocaine-prilocaine (EMLA) cream Apply 1 application topically as needed. Apply to [facility] a Cath site at least one hour prior to needle stick as needed. 30 g 3  . Magnesium Oxide 500 MG TABS Take 500 mg by mouth at bedtime.    . metFORMIN (GLUCOPHAGE) 500 MG tablet Take 500  mg by mouth 2 (two) times daily with a meal.    . montelukast (SINGULAIR) 10 MG tablet Take 10 mg by mouth daily.    Marland Kitchen omega-3 acid ethyl esters (LOVAZA) 1 G capsule Take 1 g by mouth 2 (two) times daily.    . ondansetron (ZOFRAN) 8 MG tablet Take 1 tablet (8 mg total) by mouth every 8 (eight) hours as needed. 30 tablet 1  . potassium chloride SA (K-DUR,KLOR-CON) 20 MEQ tablet Take 1 tablet (20 mEq total) by mouth daily. 30 tablet 5  . pravastatin (PRAVACHOL) 40 MG tablet Take 20 mg by mouth See admin instructions. Take 1/2 tablet (20 mg) on Monday, Tuesday, Wednesday and Thursday    . prochlorperazine (COMPAZINE) 10 MG tablet Take 1 tablet (10 mg total) by mouth every 6 (six) hours as needed (Nausea or vomiting). 30 tablet 1  . traMADol-acetaminophen (ULTRACET) 37.5-325 MG per tablet Take 1 tablet by mouth every 6 (six) hours as needed. 30 tablet 0  . warfarin (COUMADIN) 5 MG tablet Take 2.5-5 mg by mouth daily. Take 1/2 tablet (2.5 mg) on Tuesday and Thursday, take 1 tablet (5 mg) on Monday, Wednesday, Friday, Saturday and Sunday     No current facility-administered medications for this visit.    PHYSICAL EXAMINATION: ECOG PERFORMANCE STATUS: 1 - Symptomatic but completely ambulatory  Filed Vitals:   01/08/14 1458  BP: 135/58  Pulse: 82  Temp: 98.3 F (36.8 C)  Resp: 18   Filed Weights   01/08/14 1458  Weight: 137 lb 3.2 oz (62.234 kg)    GENERAL:alert, no distress and comfortable SKIN: skin color, texture, turgor are normal, no rashes or significant lesions EYES: normal, Conjunctiva are pink and non-injected, sclera clear Musculoskeletal:no cyanosis of digits and no clubbing  NEURO: alert & oriented x 3 with fluent speech, no focal motor/sensory deficits  LABORATORY DATA:  I have reviewed the data as listed    Component Value Date/Time   NA 139 01/03/2014 0905   NA 138 11/26/2013 1405   K 4.7 01/03/2014 0905   K 4.4 11/26/2013 1405   CL 100 11/26/2013 1405   CO2 27  01/03/2014 0905   CO2 26 11/26/2013 1405   GLUCOSE 129 01/03/2014 0905   GLUCOSE 102* 11/26/2013 1405   BUN 14.5 01/03/2014 0905   BUN 16 11/26/2013 1405   CREATININE 0.8 01/03/2014 0905   CREATININE 0.55 11/26/2013 1405   CALCIUM 10.1 01/03/2014 0905   CALCIUM 10.0 11/26/2013 1405   PROT 7.0 01/03/2014 0905   PROT 6.8 06/16/2010 1323   ALBUMIN 4.0 01/03/2014 0905   ALBUMIN 4.0 06/16/2010 1323   AST 14 01/03/2014 0905   AST 22 06/16/2010 1323   ALT 15 01/03/2014 0905   ALT 33 06/16/2010 1323   ALKPHOS 79 01/03/2014 0905   ALKPHOS 82 06/16/2010 1323   BILITOT 0.57 01/03/2014 0905   BILITOT 0.7 06/16/2010 1323   GFRNONAA 89* 11/26/2013 1405   GFRAA >90 11/26/2013 1405    No results found for: SPEP, UPEP  Lab Results  Component Value Date  WBC 6.2 01/04/2014   NEUTROABS 4.3 01/04/2014   HGB 10.4* 01/04/2014   HCT 32.4* 01/04/2014   MCV 85.0 01/04/2014   PLT 178 01/04/2014      Chemistry      Component Value Date/Time   NA 139 01/03/2014 0905   NA 138 11/26/2013 1405   K 4.7 01/03/2014 0905   K 4.4 11/26/2013 1405   CL 100 11/26/2013 1405   CO2 27 01/03/2014 0905   CO2 26 11/26/2013 1405   BUN 14.5 01/03/2014 0905   BUN 16 11/26/2013 1405   CREATININE 0.8 01/03/2014 0905   CREATININE 0.55 11/26/2013 1405      Component Value Date/Time   CALCIUM 10.1 01/03/2014 0905   CALCIUM 10.0 11/26/2013 1405   ALKPHOS 79 01/03/2014 0905   ALKPHOS 82 06/16/2010 1323   AST 14 01/03/2014 0905   AST 22 06/16/2010 1323   ALT 15 01/03/2014 0905   ALT 33 06/16/2010 1323   BILITOT 0.57 01/03/2014 0905   BILITOT 0.7 06/16/2010 1323       RADIOGRAPHIC STUDIES:I reviewed the most recent PET/CT scan with the patient and family. I have personally reviewed the radiological images as listed and agreed with the findings in the report.  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 Clinically, she has stage III disease. I reviewed with her the national guidelines in approach to follicular lymphoma low-grade versus high-grade. Due to her chronic history of atrial fibrillation, even in the absence of cardiomyopathy or congestive heart failure, I am concerned about prescribing adriamycin containing regimens. I shared with her the data using bendamustine & rituximab. Ultimately, I recommend to treat her using bendamustine with rituximab rather than R CHOP chemotherapy. The decision was made based on publication in the Blood: Randomized trial of bendamustine-rituximab or R-CHOP/R-CVP in first-line treatment of indolent NHL or MCL: the BRIGHT study.  [place] [address], Lucianne Lei der [address] [place] BS, [address] M, Kwan YL, Liston D, Craig M, Kolibaba K, Issa S, [place], Modjeska DM, Munteanu M, Aram Beecham JM SO  Blood. 2014;123(19):2944.  The chemotherapy consists of   1. Bendamustine at 90 mg/m2 on day 1 & 2 2. Rituximab at 375 mg/m2 on day 1 Each cycle + 28 days  Plan for 6 cycles total with Neulasta support  In the international phase III BRIGHT trial, 447 previously untreated patients with advanced stage follicular (n = 211 patients), mantle cell (n = 74 patients), or other indolent lymphoma were randomly assigned to six cycles of BR according to the same dose and schedule described above or to R-CHOP or R-CVP. BR resulted in similar complete (31 versus 25 percent) and overall (97 versus 91 percent) response rates. BR was associated with higher rates of vomiting and drug hypersensitivity and lower rates of peripheral neuropathy/paresthesia and alopecia. The use of prophylactic antiemetics was not specified in the protocol and was more common among patients assigned to R-CHOP.   We discussed the role of chemotherapy. The intent is for cure.  We discussed some of the risks, benefits and side-effects of Rituximab with Bendamustine.    Some of the short term side-effects included, though not limited to, risk of fatigue, weight loss, tumor lysis syndrome, risk of allergic reactions, pancytopenia, life-threatening infections, need for transfusions of blood products, nausea, vomiting, change in bowel habits, admission to hospital  for various reasons, and risks of death.   Long term side-effects are also discussed including permanent damage to nerve function, chronic fatigue, and rare secondary malignancy including bone marrow disorders.   The patient is aware that the response rates discussed earlier is not guaranteed.    After a long discussion, patient made an informed decision to proceed with the prescribed plan of care.   Patient education material was dispensed     Atrial fibrillation Her most recent echocardiogram show preserved ejection fraction. I recommend close follow-up and INR monitoring with her PCP due to potential interaction between warfarin and chemotherapy.  Anemia in neoplastic disease This is likely anemia of chronic disease. The patient denies recent history of bleeding such as epistaxis, hematuria or hematochezia. She is asymptomatic from the anemia. We will observe for now.  She does not require transfusion now.    At high risk of tumor lysis syndrome I recommend plenty of fluid hydration and allopurinol.   Orders Placed This Encounter  Procedures  . CBC with Differential    Standing Status: Standing     Number of Occurrences: 9     Standing Expiration Date: 01/09/2015  . Comprehensive metabolic panel    Standing Status: Standing     Number of Occurrences: 9     Standing Expiration Date: 01/09/2015  . Lactate dehydrogenase    Standing Status: Standing     Number of Occurrences: 2     Standing Expiration Date: 01/09/2015  . Uric Acid    Standing Status: Standing     Number of Occurrences: 2     Standing Expiration Date: 01/09/2015  . PHYSICIAN COMMUNICATION ORDER    Hepatitis B  Virus screening with HBsAg and anti-HBc recommended prior to treatment with rituximab (Rituxan), ofatumumab (Arzerra) or obinutuzumab Dyann Kief).   All questions were answered. The patient knows to call the clinic with any problems, questions or concerns. No barriers to learning was detected. I spent 40 minutes counseling the patient face to face. The total time spent in the appointment was 60 minutes and more than 50% was on counseling and review of test results     [facility], [place], MD 01/08/2014 4:16 PM

## 2014-01-09 ENCOUNTER — Ambulatory Visit (HOSPITAL_BASED_OUTPATIENT_CLINIC_OR_DEPARTMENT_OTHER): Payer: Medicare Other

## 2014-01-09 DIAGNOSIS — C8591 Non-Hodgkin lymphoma, unspecified, lymph nodes of head, face, and neck: Secondary | ICD-10-CM

## 2014-01-09 DIAGNOSIS — Z5111 Encounter for antineoplastic chemotherapy: Secondary | ICD-10-CM | POA: Diagnosis not present

## 2014-01-09 DIAGNOSIS — Z5112 Encounter for antineoplastic immunotherapy: Secondary | ICD-10-CM

## 2014-01-09 MED ORDER — ONDANSETRON 8 MG/50ML IVPB (CHCC)
8.0000 mg | Freq: Once | INTRAVENOUS | Status: AC
  Administered 2014-01-09: 8 mg via INTRAVENOUS

## 2014-01-09 MED ORDER — ACETAMINOPHEN 325 MG PO TABS
ORAL_TABLET | ORAL | Status: AC
  Filled 2014-01-09: qty 2

## 2014-01-09 MED ORDER — HEPARIN SOD (PORK) LOCK FLUSH 100 UNIT/ML IV SOLN
500.0000 [IU] | Freq: Once | INTRAVENOUS | Status: AC | PRN
  Administered 2014-01-09: 500 [IU]
  Filled 2014-01-09: qty 5

## 2014-01-09 MED ORDER — SODIUM CHLORIDE 0.9 % IV SOLN
90.0000 mg/m2 | Freq: Once | INTRAVENOUS | Status: AC
  Administered 2014-01-09: 153 mg via INTRAVENOUS
  Filled 2014-01-09: qty 1.7

## 2014-01-09 MED ORDER — SODIUM CHLORIDE 0.9 % IV SOLN
375.0000 mg/m2 | Freq: Once | INTRAVENOUS | Status: AC
  Administered 2014-01-09: 600 mg via INTRAVENOUS
  Filled 2014-01-09: qty 60

## 2014-01-09 MED ORDER — DEXAMETHASONE SODIUM PHOSPHATE 10 MG/ML IJ SOLN
INTRAMUSCULAR | Status: AC
  Filled 2014-01-09: qty 1

## 2014-01-09 MED ORDER — DIPHENHYDRAMINE HCL 25 MG PO CAPS
ORAL_CAPSULE | ORAL | Status: AC
  Filled 2014-01-09: qty 2

## 2014-01-09 MED ORDER — DEXAMETHASONE SODIUM PHOSPHATE 10 MG/ML IJ SOLN
10.0000 mg | Freq: Once | INTRAMUSCULAR | Status: AC
  Administered 2014-01-09: 10 mg via INTRAVENOUS

## 2014-01-09 MED ORDER — ACETAMINOPHEN 325 MG PO TABS
650.0000 mg | ORAL_TABLET | Freq: Once | ORAL | Status: AC
  Administered 2014-01-09: 650 mg via ORAL

## 2014-01-09 MED ORDER — SODIUM CHLORIDE 0.9 % IJ SOLN
10.0000 mL | INTRAMUSCULAR | Status: DC | PRN
  Administered 2014-01-09: 10 mL
  Filled 2014-01-09: qty 10

## 2014-01-09 MED ORDER — DIPHENHYDRAMINE HCL 25 MG PO CAPS
50.0000 mg | ORAL_CAPSULE | Freq: Once | ORAL | Status: AC
  Administered 2014-01-09: 50 mg via ORAL

## 2014-01-09 MED ORDER — SODIUM CHLORIDE 0.9 % IV SOLN
Freq: Once | INTRAVENOUS | Status: AC
  Administered 2014-01-09: 09:00:00 via INTRAVENOUS

## 2014-01-09 MED ORDER — ONDANSETRON 8 MG/NS 50 ML IVPB
INTRAVENOUS | Status: AC
  Filled 2014-01-09: qty 8

## 2014-01-09 NOTE — Progress Notes (Signed)
At 1120, Pt c/o  Itching and rash on abd area. Denies difficulty breathing,  VSS at 146/56 Sao2  On Ra is 100, pulse is 79 temp 98.3, Res at 18.  Infusion stopped.  Pepcid 20 mg given  Iv . Normal saline running at wide open.

## 2014-01-09 NOTE — Patient Instructions (Signed)
[facility] Discharge Instructions for Patients Receiving Chemotherapy  Today you received the following chemotherapy agents Rituxan and Treanda   To help prevent nausea and vomiting after your treatment, we encourage you to take your nausea medication  Compazine as directed and Zofran   If you develop nausea and vomiting that is not controlled by your nausea medication, call the clinic.   BELOW ARE SYMPTOMS THAT SHOULD BE REPORTED IMMEDIATELY:  *FEVER GREATER THAN 100.5 F  *CHILLS WITH OR WITHOUT FEVER  NAUSEA AND VOMITING THAT IS NOT CONTROLLED WITH YOUR NAUSEA MEDICATION  *UNUSUAL SHORTNESS OF BREATH  *UNUSUAL BRUISING OR BLEEDING  TENDERNESS IN MOUTH AND THROAT WITH OR WITHOUT PRESENCE OF ULCERS  *URINARY PROBLEMS  *BOWEL PROBLEMS  UNUSUAL RASH Items with * indicate a potential emergency and should be followed up as soon as possible.  Feel free to call the clinic you have any questions or concerns. The clinic phone number is [phone].

## 2014-01-09 NOTE — Progress Notes (Signed)
1415: patient reports mild nausea, requesting to take her home supply compazine 10 mg. Patient took 1 tablet at this time.  1425: patient reports nausea has almost completely resolved and feels no other changes. VSS, Rituxan rate increased to 181 ml/hr x91 ml.

## 2014-01-10 ENCOUNTER — Ambulatory Visit: Payer: Medicare Other | Admitting: Nutrition

## 2014-01-10 ENCOUNTER — Ambulatory Visit (INDEPENDENT_AMBULATORY_CARE_PROVIDER_SITE_OTHER): Payer: Medicare Other | Admitting: Gynecology

## 2014-01-10 ENCOUNTER — Ambulatory Visit (HOSPITAL_BASED_OUTPATIENT_CLINIC_OR_DEPARTMENT_OTHER): Payer: Medicare Other

## 2014-01-10 ENCOUNTER — Encounter: Payer: Self-pay | Admitting: Gynecology

## 2014-01-10 ENCOUNTER — Ambulatory Visit: Payer: Medicare Other | Admitting: Hematology and Oncology

## 2014-01-10 VITALS — BP 136/70 | Ht 66.0 in | Wt 138.0 lb

## 2014-01-10 DIAGNOSIS — N816 Rectocele: Secondary | ICD-10-CM

## 2014-01-10 DIAGNOSIS — Z79890 Hormone replacement therapy (postmenopausal): Secondary | ICD-10-CM | POA: Diagnosis not present

## 2014-01-10 DIAGNOSIS — N952 Postmenopausal atrophic vaginitis: Secondary | ICD-10-CM | POA: Diagnosis not present

## 2014-01-10 DIAGNOSIS — C8591 Non-Hodgkin lymphoma, unspecified, lymph nodes of head, face, and neck: Secondary | ICD-10-CM

## 2014-01-10 DIAGNOSIS — Z5111 Encounter for antineoplastic chemotherapy: Secondary | ICD-10-CM

## 2014-01-10 LAB — TISSUE HYBRIDIZATION (BONE MARROW)-NCBH

## 2014-01-10 LAB — CHROMOSOME ANALYSIS, BONE MARROW

## 2014-01-10 MED ORDER — SODIUM CHLORIDE 0.9 % IV SOLN
90.0000 mg/m2 | Freq: Once | INTRAVENOUS | Status: AC
  Administered 2014-01-10: 153 mg via INTRAVENOUS
  Filled 2014-01-10: qty 1.7

## 2014-01-10 MED ORDER — DEXAMETHASONE SODIUM PHOSPHATE 10 MG/ML IJ SOLN
10.0000 mg | Freq: Once | INTRAMUSCULAR | Status: AC
  Administered 2014-01-10: 10 mg via INTRAVENOUS

## 2014-01-10 MED ORDER — HEPARIN SOD (PORK) LOCK FLUSH 100 UNIT/ML IV SOLN
500.0000 [IU] | Freq: Once | INTRAVENOUS | Status: AC | PRN
Start: 2014-01-10 — End: 2014-01-10
  Administered 2014-01-10: 500 [IU]
  Filled 2014-01-10: qty 5

## 2014-01-10 MED ORDER — DEXAMETHASONE SODIUM PHOSPHATE 10 MG/ML IJ SOLN
INTRAMUSCULAR | Status: AC
  Filled 2014-01-10: qty 1

## 2014-01-10 MED ORDER — ONDANSETRON 8 MG/50ML IVPB (CHCC)
8.0000 mg | Freq: Once | INTRAVENOUS | Status: AC
  Administered 2014-01-10: 8 mg via INTRAVENOUS

## 2014-01-10 MED ORDER — SODIUM CHLORIDE 0.9 % IJ SOLN
10.0000 mL | INTRAMUSCULAR | Status: DC | PRN
  Administered 2014-01-10: 10 mL
  Filled 2014-01-10: qty 10

## 2014-01-10 MED ORDER — ONDANSETRON 8 MG/NS 50 ML IVPB
INTRAVENOUS | Status: AC
  Filled 2014-01-10: qty 8

## 2014-01-10 MED ORDER — SODIUM CHLORIDE 0.9 % IV SOLN
Freq: Once | INTRAVENOUS | Status: AC
  Administered 2014-01-10: 14:00:00 via INTRAVENOUS

## 2014-01-10 NOTE — Progress Notes (Signed)
Patty [name] January 04, 1941 [id number]   History:    73 y.o.  for GYN exam and follow up. Patient was diagnosed this year with stage III and lymphoma and has received her first round of chemotherapy. See medical oncologist noted on file.patient is also been followed by the urologist Dr. Irine Seal because of past history of recurrent UTIs. She uses vaginal estrogen cream 3 times a week for her severe vaginal atrophy.Her PCP is Dr. Shelia Media please see past medical history and medication list in chart for details. Patient in 32 had a total abdominal hysterectomy as a result of fibroid uterus along with a pubovaginal sling in 1998. Patient denies any stress urinary incontinence. Patient will longer sexually active. Patient's vaccines are up-to-date.Patient has history of colon polyp and is due for a colonoscopy next year. Patient with no prior history of abnormal Pap smear. Patient is receiving her bone density study for at her PCP office.  Past medical history,surgical history, family history and social history were all reviewed and documented in the EPIC chart.  Gynecologic History No LMP recorded. Patient has had a hysterectomy. Contraception: status post hysterectomy Last Pap: 2010. Results were: normal Last mammogram: 2015. Results were: three-dimensional patient had dense breasts  Obstetric History OB History  Gravida Para Term Preterm AB SAB TAB Ectopic Multiple Living  2 2 2       2     # Outcome Date GA Lbr Len/2nd Weight Sex Delivery Anes PTL Lv  2 Term           1 Term                ROS: A ROS was performed and pertinent positives and negatives are included in the history.  GENERAL: No fevers or chills. HEENT: No change in vision, no earache, sore throat or sinus congestion. NECK: No pain or stiffness. CARDIOVASCULAR: No chest pain or pressure. No palpitations. PULMONARY: No shortness of breath, cough or wheeze. GASTROINTESTINAL: No abdominal pain, nausea, vomiting or diarrhea,  melena or bright red blood per rectum. GENITOURINARY: No urinary frequency, urgency, hesitancy or dysuria. MUSCULOSKELETAL: No joint or muscle pain, no back pain, no recent trauma. DERMATOLOGIC: No rash, no itching, no lesions. ENDOCRINE: No polyuria, polydipsia, no heat or cold intolerance. No recent change in weight. HEMATOLOGICAL: No anemia or easy bruising or bleeding. NEUROLOGIC: No headache, seizures, numbness, tingling or weakness. PSYCHIATRIC: No depression, no loss of interest in normal activity or change in sleep pattern.     Exam: chaperone present  BP 136/70 mmHg  Ht 5\' 6"  (1.676 m)  Wt 138 lb (62.596 kg)  BMI 22.28 kg/m2  Body mass index is 22.28 kg/(m^2).  General appearance : Well developed well nourished female. No acute distress HEENT: Neck supple, trachea midline, no carotid bruits, no thyroidmegaly, palpable lymphadenopathy base of the neck and left axilla Lungs: Clear to auscultation, no rhonchi or wheezes, or rib retractions  Heart: Regular rate and rhythm, no murmurs or gallops Breast:Examined in sitting and supine position were symmetrical in appearance, no palpable masses or tenderness,  no skin retraction, no nipple inversion, no nipple discharge, no skin discoloration, no axillary or supraclavicular lymphadenopathy Abdomen: no palpable masses or tenderness, no rebound or guarding Extremities: no edema or skin discoloration or tenderness  Pelvic:  Bartholin, Urethra, Skene Glands: Within normal limits             Vagina: No gross lesions or discharge, vaginal atrophy  Cervix: absent  Uterus Absent  Adnexa  Without masses or tenderness  Anus and perineum  normal   Rectovaginal  normal sphincter tone without palpated masses or tenderness             Hemoccult PCP provides cards     Assessment/Plan:  73 y.o. female for annual exam who was diagnosed this year with stage III lymphoma has received her first round of chemotherapy. Patient has Port-A-Cath in place.  Patient with severe vaginal atrophy responding well to Premarin estrogen cream 3 times a week. Patient's vaccines are all up-to-date. She is due either this year next year for follow-up colonoscopy due to her past history of colon polyps. PCP will and medical oncologist will be doing her blood work. She will check with her PCP in reference to her next bone density since according to her was in 2002.   Terrance Mass MD, 12:19 PM 01/10/2014

## 2014-01-10 NOTE — Progress Notes (Signed)
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  She is a patient of Dr. Alvy Bimler.  Past medical history includes asthma, hypothyroidism, hyperlipidemia, atrial fibrillation, diabetes, hypertension, anemia, GERD, and hearing loss.  Medications include vitamin D, Zetia, Synthroid, magnesium oxide, Glucophage, Lovaza,K-Dur, Pravachol, and Coumadin.  Labs were reviewed.  Height: 66.5 inches. Weight: 137.2 pounds November 10. Usual body weight: 166 pounds in 2012. BMI: 21.82.  Patient reports nausea but denies vomiting.  She has a good appetite.  Patient reports weight loss was intentional secondary to diagnosis of diabetes.  She is receiving chemotherapy.  Nutrition diagnosis: Food and nutrition related knowledge deficit related to new diagnosis of lymphoma as evidenced by no prior need for nutrition related information.  Intervention: Patient was educated to consume smaller, more frequent meals and snacks containing high-protein foods. Patient educated to strive for weight maintenance. Educated patient on strategies for eating with nausea. Questions were answered.  Teach back method used.  Contact information was provided.  Monitoring, evaluation, goals: Patient will tolerate adequate calories and protein to minimize weight loss throughout treatment.  Next visit: Patient to contact me for questions.  **Disclaimer: This note was dictated with voice recognition software. Similar sounding words can inadvertently be transcribed and this note may contain transcription errors which may not have been corrected upon publication of note.**

## 2014-01-10 NOTE — Patient Instructions (Signed)

## 2014-01-10 NOTE — Patient Instructions (Signed)
[facility] Discharge Instructions for Patients Receiving Chemotherapy  Today you received the following chemotherapy agents Treanda.  To help prevent nausea and vomiting after your treatment, we encourage you to take your nausea medication.   If you develop nausea and vomiting that is not controlled by your nausea medication, call the clinic.   BELOW ARE SYMPTOMS THAT SHOULD BE REPORTED IMMEDIATELY:  *FEVER GREATER THAN 100.5 F  *CHILLS WITH OR WITHOUT FEVER  NAUSEA AND VOMITING THAT IS NOT CONTROLLED WITH YOUR NAUSEA MEDICATION  *UNUSUAL SHORTNESS OF BREATH  *UNUSUAL BRUISING OR BLEEDING  TENDERNESS IN MOUTH AND THROAT WITH OR WITHOUT PRESENCE OF ULCERS  *URINARY PROBLEMS  *BOWEL PROBLEMS  UNUSUAL RASH Items with * indicate a potential emergency and should be followed up as soon as possible.  Feel free to call the clinic you have any questions or concerns. The clinic phone number is [phone].    

## 2014-01-11 ENCOUNTER — Ambulatory Visit (HOSPITAL_BASED_OUTPATIENT_CLINIC_OR_DEPARTMENT_OTHER): Payer: Medicare Other

## 2014-01-11 DIAGNOSIS — Z5189 Encounter for other specified aftercare: Secondary | ICD-10-CM | POA: Diagnosis not present

## 2014-01-11 DIAGNOSIS — C8591 Non-Hodgkin lymphoma, unspecified, lymph nodes of head, face, and neck: Secondary | ICD-10-CM | POA: Diagnosis not present

## 2014-01-11 MED ORDER — PEGFILGRASTIM INJECTION 6 MG/0.6ML ~~LOC~~
6.0000 mg | PREFILLED_SYRINGE | Freq: Once | SUBCUTANEOUS | Status: AC
  Administered 2014-01-11: 6 mg via SUBCUTANEOUS
  Filled 2014-01-11: qty 0.6

## 2014-01-11 NOTE — Patient Instructions (Signed)
Pegfilgrastim injection What is this medicine? PEGFILGRASTIM (peg fil GRA stim) is a long-acting granulocyte colony-stimulating factor that stimulates the growth of neutrophils, a type of white blood cell important in the body's fight against infection. It is used to reduce the incidence of fever and infection in patients with certain types of cancer who are receiving chemotherapy that affects the bone marrow. This medicine may be used for other purposes; ask your health care provider or pharmacist if you have questions. COMMON BRAND NAME(S): Neulasta What should I tell my health care provider before I take this medicine? They need to know if you have any of these conditions: -latex allergy -ongoing radiation therapy -sickle cell disease -skin reactions to acrylic adhesives (On-Body Injector only) -an unusual or allergic reaction to pegfilgrastim, filgrastim, other medicines, foods, dyes, or preservatives -pregnant or trying to get pregnant -breast-feeding How should I use this medicine? This medicine is for injection under the skin. If you get this medicine at home, you will be taught how to prepare and give the pre-filled syringe or how to use the On-body Injector. Refer to the patient Instructions for Use for detailed instructions. Use exactly as directed. Take your medicine at regular intervals. Do not take your medicine more often than directed. It is important that you put your used needles and syringes in a special sharps container. Do not put them in a trash can. If you do not have a sharps container, call your pharmacist or healthcare provider to get one. Talk to your pediatrician regarding the use of this medicine in children. Special care may be needed. Overdosage: If you think you have taken too much of this medicine contact a poison control center or emergency room at once. NOTE: This medicine is only for you. Do not share this medicine with others. What if I miss a dose? It is  important not to miss your dose. Call your doctor or health care professional if you miss your dose. If you miss a dose due to an On-body Injector failure or leakage, a new dose should be administered as soon as possible using a single prefilled syringe for manual use. What may interact with this medicine? Interactions have not been studied. Give your health care provider a list of all the medicines, herbs, non-prescription drugs, or dietary supplements you use. Also tell them if you smoke, drink alcohol, or use illegal drugs. Some items may interact with your medicine. This list may not describe all possible interactions. Give your health care provider a list of all the medicines, herbs, non-prescription drugs, or dietary supplements you use. Also tell them if you smoke, drink alcohol, or use illegal drugs. Some items may interact with your medicine. What should I watch for while using this medicine? You may need blood work done while you are taking this medicine. If you are going to need a MRI, CT scan, or other procedure, tell your doctor that you are using this medicine (On-Body Injector only). What side effects may I notice from receiving this medicine? Side effects that you should report to your doctor or health care professional as soon as possible: -allergic reactions like skin rash, itching or hives, swelling of the face, lips, or tongue -dizziness -fever -pain, redness, or irritation at site where injected -pinpoint red spots on the skin -shortness of breath or breathing problems -stomach or side pain, or pain at the shoulder -swelling -tiredness -trouble passing urine Side effects that usually do not require medical attention (report to your doctor   or health care professional if they continue or are bothersome): -bone pain -muscle pain This list may not describe all possible side effects. Call your doctor for medical advice about side effects. You may report side effects to FDA at  1-800-FDA-1088. Where should I keep my medicine? Keep out of the reach of children. Store pre-filled syringes in a refrigerator between 2 and 8 degrees C (36 and 46 degrees F). Do not freeze. Keep in carton to protect from light. Throw away this medicine if it is left out of the refrigerator for more than 48 hours. Throw away any unused medicine after the expiration date. NOTE: This sheet is a summary. It may not cover all possible information. If you have questions about this medicine, talk to your doctor, pharmacist, or health care provider.  2015, Elsevier/Gold Standard. (2013-05-17 16:14:05)  

## 2014-01-14 ENCOUNTER — Telehealth: Payer: Self-pay | Admitting: *Deleted

## 2014-01-14 DIAGNOSIS — Z7901 Long term (current) use of anticoagulants: Secondary | ICD-10-CM | POA: Diagnosis not present

## 2014-01-14 NOTE — Telephone Encounter (Signed)
Spoke with pt today for post chemo follow up.  Pt stated she was doing ok.  Stated she had experienced rapid heart rate and skip beats this am, but now feeling ok.  Pt is seeing her cardiologist this afternoon for INR check .  Pt stated she would inform cardiologist about abnormal heart rate. In regard to chemo, pt stated she was doing fine.  Had mild nausea on Saturday; took antiemetics as prescribed with relief.  Good appetite and drinking lots of fluids as tolerated.  Bowel and bladder function fine.  Denied pain.  Pt aware of next office visit on 02/06/14.  Pt understood to call office with any new problems.

## 2014-01-18 ENCOUNTER — Inpatient Hospital Stay (HOSPITAL_COMMUNITY)
Admission: EM | Admit: 2014-01-18 | Discharge: 2014-02-01 | DRG: 871 | Disposition: A | Discharge status: Skilled Nursing Facility | Payer: Medicare Other | Attending: Internal Medicine | Admitting: Internal Medicine

## 2014-01-18 ENCOUNTER — Emergency Department (HOSPITAL_COMMUNITY): Payer: Medicare Other

## 2014-01-18 DIAGNOSIS — I482 Chronic atrial fibrillation: Secondary | ICD-10-CM | POA: Diagnosis present

## 2014-01-18 DIAGNOSIS — R6521 Severe sepsis with septic shock: Secondary | ICD-10-CM | POA: Diagnosis not present

## 2014-01-18 DIAGNOSIS — K1230 Oral mucositis (ulcerative), unspecified: Secondary | ICD-10-CM | POA: Diagnosis not present

## 2014-01-18 DIAGNOSIS — G9341 Metabolic encephalopathy: Secondary | ICD-10-CM | POA: Diagnosis not present

## 2014-01-18 DIAGNOSIS — G4730 Sleep apnea, unspecified: Secondary | ICD-10-CM | POA: Diagnosis not present

## 2014-01-18 DIAGNOSIS — R509 Fever, unspecified: Secondary | ICD-10-CM | POA: Diagnosis not present

## 2014-01-18 DIAGNOSIS — R110 Nausea: Secondary | ICD-10-CM | POA: Diagnosis not present

## 2014-01-18 DIAGNOSIS — K521 Toxic gastroenteritis and colitis: Secondary | ICD-10-CM | POA: Diagnosis not present

## 2014-01-18 DIAGNOSIS — Z8249 Family history of ischemic heart disease and other diseases of the circulatory system: Secondary | ICD-10-CM | POA: Diagnosis not present

## 2014-01-18 DIAGNOSIS — E876 Hypokalemia: Secondary | ICD-10-CM | POA: Diagnosis not present

## 2014-01-18 DIAGNOSIS — E038 Other specified hypothyroidism: Secondary | ICD-10-CM | POA: Diagnosis not present

## 2014-01-18 DIAGNOSIS — E785 Hyperlipidemia, unspecified: Secondary | ICD-10-CM | POA: Diagnosis present

## 2014-01-18 DIAGNOSIS — J45909 Unspecified asthma, uncomplicated: Secondary | ICD-10-CM | POA: Diagnosis present

## 2014-01-18 DIAGNOSIS — B961 Klebsiella pneumoniae [K. pneumoniae] as the cause of diseases classified elsewhere: Secondary | ICD-10-CM | POA: Diagnosis present

## 2014-01-18 DIAGNOSIS — E46 Unspecified protein-calorie malnutrition: Secondary | ICD-10-CM | POA: Diagnosis present

## 2014-01-18 DIAGNOSIS — J9690 Respiratory failure, unspecified, unspecified whether with hypoxia or hypercapnia: Secondary | ICD-10-CM

## 2014-01-18 DIAGNOSIS — I4891 Unspecified atrial fibrillation: Secondary | ICD-10-CM | POA: Diagnosis present

## 2014-01-18 DIAGNOSIS — B9620 Unspecified Escherichia coli [E. coli] as the cause of diseases classified elsewhere: Secondary | ICD-10-CM | POA: Diagnosis not present

## 2014-01-18 DIAGNOSIS — Z823 Family history of stroke: Secondary | ICD-10-CM

## 2014-01-18 DIAGNOSIS — D638 Anemia in other chronic diseases classified elsewhere: Secondary | ICD-10-CM | POA: Diagnosis not present

## 2014-01-18 DIAGNOSIS — J90 Pleural effusion, not elsewhere classified: Secondary | ICD-10-CM | POA: Diagnosis not present

## 2014-01-18 DIAGNOSIS — M6281 Muscle weakness (generalized): Secondary | ICD-10-CM | POA: Diagnosis not present

## 2014-01-18 DIAGNOSIS — I480 Paroxysmal atrial fibrillation: Secondary | ICD-10-CM | POA: Diagnosis not present

## 2014-01-18 DIAGNOSIS — D630 Anemia in neoplastic disease: Secondary | ICD-10-CM

## 2014-01-18 DIAGNOSIS — R6520 Severe sepsis without septic shock: Secondary | ICD-10-CM | POA: Diagnosis not present

## 2014-01-18 DIAGNOSIS — J9691 Respiratory failure, unspecified with hypoxia: Secondary | ICD-10-CM | POA: Diagnosis not present

## 2014-01-18 DIAGNOSIS — M549 Dorsalgia, unspecified: Secondary | ICD-10-CM | POA: Diagnosis not present

## 2014-01-18 DIAGNOSIS — Z9049 Acquired absence of other specified parts of digestive tract: Secondary | ICD-10-CM | POA: Diagnosis present

## 2014-01-18 DIAGNOSIS — A498 Other bacterial infections of unspecified site: Secondary | ICD-10-CM | POA: Diagnosis not present

## 2014-01-18 DIAGNOSIS — D72820 Lymphocytosis (symptomatic): Secondary | ICD-10-CM | POA: Diagnosis present

## 2014-01-18 DIAGNOSIS — E119 Type 2 diabetes mellitus without complications: Secondary | ICD-10-CM | POA: Diagnosis present

## 2014-01-18 DIAGNOSIS — D6959 Other secondary thrombocytopenia: Secondary | ICD-10-CM | POA: Diagnosis present

## 2014-01-18 DIAGNOSIS — E870 Hyperosmolality and hypernatremia: Secondary | ICD-10-CM | POA: Diagnosis present

## 2014-01-18 DIAGNOSIS — I10 Essential (primary) hypertension: Secondary | ICD-10-CM | POA: Diagnosis present

## 2014-01-18 DIAGNOSIS — C8291 Follicular lymphoma, unspecified, lymph nodes of head, face, and neck: Secondary | ICD-10-CM | POA: Diagnosis not present

## 2014-01-18 DIAGNOSIS — J9600 Acute respiratory failure, unspecified whether with hypoxia or hypercapnia: Secondary | ICD-10-CM | POA: Diagnosis not present

## 2014-01-18 DIAGNOSIS — E039 Hypothyroidism, unspecified: Secondary | ICD-10-CM | POA: Diagnosis present

## 2014-01-18 DIAGNOSIS — E2740 Unspecified adrenocortical insufficiency: Secondary | ICD-10-CM | POA: Diagnosis present

## 2014-01-18 DIAGNOSIS — K219 Gastro-esophageal reflux disease without esophagitis: Secondary | ICD-10-CM | POA: Diagnosis present

## 2014-01-18 DIAGNOSIS — C8281 Other types of follicular lymphoma, lymph nodes of head, face, and neck: Secondary | ICD-10-CM | POA: Diagnosis present

## 2014-01-18 DIAGNOSIS — R0902 Hypoxemia: Secondary | ICD-10-CM | POA: Diagnosis not present

## 2014-01-18 DIAGNOSIS — K1231 Oral mucositis (ulcerative) due to antineoplastic therapy: Secondary | ICD-10-CM | POA: Diagnosis not present

## 2014-01-18 DIAGNOSIS — F419 Anxiety disorder, unspecified: Secondary | ICD-10-CM | POA: Diagnosis not present

## 2014-01-18 DIAGNOSIS — E860 Dehydration: Secondary | ICD-10-CM | POA: Diagnosis present

## 2014-01-18 DIAGNOSIS — Z7901 Long term (current) use of anticoagulants: Secondary | ICD-10-CM | POA: Diagnosis not present

## 2014-01-18 DIAGNOSIS — J9601 Acute respiratory failure with hypoxia: Secondary | ICD-10-CM | POA: Diagnosis not present

## 2014-01-18 DIAGNOSIS — N19 Unspecified kidney failure: Secondary | ICD-10-CM | POA: Diagnosis not present

## 2014-01-18 DIAGNOSIS — I213 ST elevation (STEMI) myocardial infarction of unspecified site: Secondary | ICD-10-CM | POA: Diagnosis not present

## 2014-01-18 DIAGNOSIS — Z4682 Encounter for fitting and adjustment of non-vascular catheter: Secondary | ICD-10-CM | POA: Diagnosis not present

## 2014-01-18 DIAGNOSIS — A4151 Sepsis due to Escherichia coli [E. coli]: Principal | ICD-10-CM | POA: Diagnosis present

## 2014-01-18 DIAGNOSIS — E1142 Type 2 diabetes mellitus with diabetic polyneuropathy: Secondary | ICD-10-CM | POA: Diagnosis present

## 2014-01-18 DIAGNOSIS — C778 Secondary and unspecified malignant neoplasm of lymph nodes of multiple regions: Secondary | ICD-10-CM | POA: Diagnosis not present

## 2014-01-18 DIAGNOSIS — R34 Anuria and oliguria: Secondary | ICD-10-CM | POA: Diagnosis not present

## 2014-01-18 DIAGNOSIS — R0603 Acute respiratory distress: Secondary | ICD-10-CM

## 2014-01-18 DIAGNOSIS — T451X5A Adverse effect of antineoplastic and immunosuppressive drugs, initial encounter: Secondary | ICD-10-CM | POA: Diagnosis present

## 2014-01-18 DIAGNOSIS — C8591 Non-Hodgkin lymphoma, unspecified, lymph nodes of head, face, and neck: Secondary | ICD-10-CM | POA: Diagnosis not present

## 2014-01-18 DIAGNOSIS — R279 Unspecified lack of coordination: Secondary | ICD-10-CM | POA: Diagnosis not present

## 2014-01-18 DIAGNOSIS — E872 Acidosis: Secondary | ICD-10-CM | POA: Diagnosis not present

## 2014-01-18 DIAGNOSIS — Z880 Allergy status to penicillin: Secondary | ICD-10-CM

## 2014-01-18 DIAGNOSIS — D72829 Elevated white blood cell count, unspecified: Secondary | ICD-10-CM | POA: Diagnosis not present

## 2014-01-18 DIAGNOSIS — R7881 Bacteremia: Secondary | ICD-10-CM | POA: Diagnosis not present

## 2014-01-18 DIAGNOSIS — D682 Hereditary deficiency of other clotting factors: Secondary | ICD-10-CM | POA: Diagnosis not present

## 2014-01-18 DIAGNOSIS — Z90710 Acquired absence of both cervix and uterus: Secondary | ICD-10-CM | POA: Diagnosis not present

## 2014-01-18 DIAGNOSIS — J80 Acute respiratory distress syndrome: Secondary | ICD-10-CM | POA: Diagnosis not present

## 2014-01-18 DIAGNOSIS — R21 Rash and other nonspecific skin eruption: Secondary | ICD-10-CM | POA: Diagnosis present

## 2014-01-18 DIAGNOSIS — J9811 Atelectasis: Secondary | ICD-10-CM | POA: Diagnosis not present

## 2014-01-18 DIAGNOSIS — Y95 Nosocomial condition: Secondary | ICD-10-CM | POA: Diagnosis present

## 2014-01-18 DIAGNOSIS — J189 Pneumonia, unspecified organism: Secondary | ICD-10-CM | POA: Diagnosis present

## 2014-01-18 DIAGNOSIS — R918 Other nonspecific abnormal finding of lung field: Secondary | ICD-10-CM | POA: Diagnosis not present

## 2014-01-18 DIAGNOSIS — N179 Acute kidney failure, unspecified: Secondary | ICD-10-CM | POA: Diagnosis not present

## 2014-01-18 DIAGNOSIS — E874 Mixed disorder of acid-base balance: Secondary | ICD-10-CM | POA: Diagnosis not present

## 2014-01-18 DIAGNOSIS — N390 Urinary tract infection, site not specified: Secondary | ICD-10-CM | POA: Diagnosis present

## 2014-01-18 DIAGNOSIS — H9190 Unspecified hearing loss, unspecified ear: Secondary | ICD-10-CM | POA: Diagnosis present

## 2014-01-18 DIAGNOSIS — G4733 Obstructive sleep apnea (adult) (pediatric): Secondary | ICD-10-CM | POA: Diagnosis present

## 2014-01-18 DIAGNOSIS — D696 Thrombocytopenia, unspecified: Secondary | ICD-10-CM | POA: Diagnosis present

## 2014-01-18 DIAGNOSIS — A419 Sepsis, unspecified organism: Secondary | ICD-10-CM

## 2014-01-18 DIAGNOSIS — N170 Acute kidney failure with tubular necrosis: Secondary | ICD-10-CM | POA: Diagnosis not present

## 2014-01-18 DIAGNOSIS — D649 Anemia, unspecified: Secondary | ICD-10-CM | POA: Diagnosis not present

## 2014-01-18 DIAGNOSIS — R140 Abdominal distension (gaseous): Secondary | ICD-10-CM | POA: Diagnosis not present

## 2014-01-18 DIAGNOSIS — G9340 Encephalopathy, unspecified: Secondary | ICD-10-CM | POA: Diagnosis not present

## 2014-01-18 DIAGNOSIS — R451 Restlessness and agitation: Secondary | ICD-10-CM | POA: Diagnosis not present

## 2014-01-18 DIAGNOSIS — R410 Disorientation, unspecified: Secondary | ICD-10-CM | POA: Diagnosis not present

## 2014-01-18 DIAGNOSIS — R197 Diarrhea, unspecified: Secondary | ICD-10-CM | POA: Diagnosis not present

## 2014-01-18 DIAGNOSIS — R0602 Shortness of breath: Secondary | ICD-10-CM | POA: Diagnosis not present

## 2014-01-18 DIAGNOSIS — I2510 Atherosclerotic heart disease of native coronary artery without angina pectoris: Secondary | ICD-10-CM | POA: Diagnosis not present

## 2014-01-18 LAB — COMPREHENSIVE METABOLIC PANEL
ALK PHOS: 166 U/L — AB (ref 39–117)
ALT: 29 U/L (ref 0–35)
AST: 21 U/L (ref 0–37)
Albumin: 3.5 g/dL (ref 3.5–5.2)
Anion gap: 14 (ref 5–15)
BUN: 14 mg/dL (ref 6–23)
CO2: 24 mEq/L (ref 19–32)
Calcium: 9.3 mg/dL (ref 8.4–10.5)
Chloride: 99 mEq/L (ref 96–112)
Creatinine, Ser: 0.75 mg/dL (ref 0.50–1.10)
GFR calc non Af Amer: 81 mL/min — ABNORMAL LOW (ref >90)
GLUCOSE: 213 mg/dL — AB (ref 70–99)
POTASSIUM: 3.5 meq/L — AB (ref 3.7–5.3)
SODIUM: 137 meq/L (ref 137–147)
Total Bilirubin: 0.7 mg/dL (ref 0.3–1.2)
Total Protein: 6.7 g/dL (ref 6.0–8.3)

## 2014-01-18 LAB — URINALYSIS, ROUTINE W REFLEX MICROSCOPIC
BILIRUBIN URINE: NEGATIVE
GLUCOSE, UA: NEGATIVE mg/dL
Ketones, ur: 15 mg/dL — AB
Nitrite: NEGATIVE
PROTEIN: 100 mg/dL — AB
Specific Gravity, Urine: 1.014 (ref 1.005–1.030)
Urobilinogen, UA: 1 mg/dL (ref 0.0–1.0)
pH: 7 (ref 5.0–8.0)

## 2014-01-18 LAB — CBC WITH DIFFERENTIAL/PLATELET
Basophils Absolute: 0 10*3/uL (ref 0.0–0.1)
Basophils Relative: 0 % (ref 0–1)
EOS PCT: 1 % (ref 0–5)
Eosinophils Absolute: 0.2 10*3/uL (ref 0.0–0.7)
HEMATOCRIT: 33.4 % — AB (ref 36.0–46.0)
Hemoglobin: 10.8 g/dL — ABNORMAL LOW (ref 12.0–15.0)
LYMPHS PCT: 3 % — AB (ref 12–46)
Lymphs Abs: 0.7 10*3/uL (ref 0.7–4.0)
MCH: 27.3 pg (ref 26.0–34.0)
MCHC: 32.3 g/dL (ref 30.0–36.0)
MCV: 84.3 fL (ref 78.0–100.0)
Monocytes Absolute: 0.9 10*3/uL (ref 0.1–1.0)
Monocytes Relative: 4 % (ref 3–12)
NEUTROS ABS: 20.1 10*3/uL — AB (ref 1.7–7.7)
NEUTROS PCT: 92 % — AB (ref 43–77)
Platelets: 86 10*3/uL — ABNORMAL LOW (ref 150–400)
RBC: 3.96 MIL/uL (ref 3.87–5.11)
RDW: 14.5 % (ref 11.5–15.5)
WBC: 21.9 10*3/uL — ABNORMAL HIGH (ref 4.0–10.5)

## 2014-01-18 LAB — PROTIME-INR
INR: 2.2 — ABNORMAL HIGH (ref 0.00–1.49)
Prothrombin Time: 24.6 seconds — ABNORMAL HIGH (ref 11.6–15.2)

## 2014-01-18 LAB — URINE MICROSCOPIC-ADD ON

## 2014-01-18 LAB — I-STAT CG4 LACTIC ACID, ED: Lactic Acid, Venous: 1.05 mmol/L (ref 0.5–2.2)

## 2014-01-18 MED ORDER — POTASSIUM CHLORIDE CRYS ER 20 MEQ PO TBCR
20.0000 meq | EXTENDED_RELEASE_TABLET | Freq: Every day | ORAL | Status: DC
  Administered 2014-01-19 – 2014-01-23 (×5): 20 meq via ORAL
  Filled 2014-01-18 (×6): qty 1

## 2014-01-18 MED ORDER — ALBUTEROL SULFATE (2.5 MG/3ML) 0.083% IN NEBU
2.5000 mg | INHALATION_SOLUTION | Freq: Three times a day (TID) | RESPIRATORY_TRACT | Status: DC
  Administered 2014-01-19 – 2014-01-20 (×4): 2.5 mg via RESPIRATORY_TRACT
  Filled 2014-01-18 (×4): qty 3

## 2014-01-18 MED ORDER — LEVOTHYROXINE SODIUM 75 MCG PO TABS
250.0000 ug | ORAL_TABLET | Freq: Every day | ORAL | Status: DC
  Administered 2014-01-19 – 2014-01-20 (×2): 250 ug via ORAL
  Filled 2014-01-18 (×3): qty 2
  Filled 2014-01-18: qty 3
  Filled 2014-01-18: qty 1

## 2014-01-18 MED ORDER — DIGOXIN 125 MCG PO TABS
125.0000 ug | ORAL_TABLET | Freq: Every day | ORAL | Status: DC
  Administered 2014-01-19 – 2014-02-01 (×12): 125 ug via ORAL
  Filled 2014-01-18 (×13): qty 1

## 2014-01-18 MED ORDER — SODIUM CHLORIDE 0.9 % IV SOLN
INTRAVENOUS | Status: DC
  Administered 2014-01-18 – 2014-01-19 (×2): via INTRAVENOUS

## 2014-01-18 MED ORDER — ACETAMINOPHEN 650 MG RE SUPP
650.0000 mg | Freq: Four times a day (QID) | RECTAL | Status: DC | PRN
  Administered 2014-01-20 (×2): 650 mg via RECTAL
  Filled 2014-01-18 (×2): qty 1

## 2014-01-18 MED ORDER — ALBUTEROL SULFATE (2.5 MG/3ML) 0.083% IN NEBU: 2.5000 mg | INHALATION_SOLUTION | Freq: Three times a day (TID) | RESPIRATORY_TRACT | Status: DC

## 2014-01-18 MED ORDER — VITAMIN D 1000 UNITS PO TABS
2000.0000 [IU] | ORAL_TABLET | Freq: Every day | ORAL | Status: DC
  Administered 2014-01-19 – 2014-02-01 (×11): 2000 [IU] via ORAL
  Filled 2014-01-18 (×13): qty 2

## 2014-01-18 MED ORDER — MAGNESIUM OXIDE 400 (241.3 MG) MG PO TABS
400.0000 mg | ORAL_TABLET | Freq: Every day | ORAL | Status: DC
  Administered 2014-01-19 – 2014-01-30 (×10): 400 mg via ORAL
  Filled 2014-01-18 (×15): qty 1

## 2014-01-18 MED ORDER — ONDANSETRON HCL 4 MG PO TABS
4.0000 mg | ORAL_TABLET | Freq: Four times a day (QID) | ORAL | Status: DC | PRN
  Administered 2014-01-21: 4 mg via ORAL

## 2014-01-18 MED ORDER — ACETAMINOPHEN 325 MG PO TABS
650.0000 mg | ORAL_TABLET | Freq: Four times a day (QID) | ORAL | Status: DC | PRN
  Administered 2014-01-19 – 2014-01-20 (×4): 650 mg via ORAL
  Filled 2014-01-18 (×4): qty 2

## 2014-01-18 MED ORDER — DEXTROSE 5 % IV SOLN
1.0000 g | Freq: Three times a day (TID) | INTRAVENOUS | Status: DC
  Administered 2014-01-19 – 2014-01-21 (×7): 1 g via INTRAVENOUS
  Filled 2014-01-18 (×12): qty 1

## 2014-01-18 MED ORDER — DILTIAZEM HCL ER COATED BEADS 120 MG PO CP24
120.0000 mg | ORAL_CAPSULE | Freq: Every day | ORAL | Status: DC
  Administered 2014-01-20: 120 mg via ORAL
  Filled 2014-01-18 (×2): qty 1

## 2014-01-18 MED ORDER — MONTELUKAST SODIUM 10 MG PO TABS
10.0000 mg | ORAL_TABLET | Freq: Every day | ORAL | Status: DC
  Administered 2014-01-19 – 2014-02-01 (×13): 10 mg via ORAL
  Filled 2014-01-18 (×13): qty 1

## 2014-01-18 MED ORDER — EZETIMIBE 10 MG PO TABS
10.0000 mg | ORAL_TABLET | Freq: Every day | ORAL | Status: DC
  Administered 2014-01-19 – 2014-02-01 (×14): 10 mg via ORAL
  Filled 2014-01-18 (×16): qty 1

## 2014-01-18 MED ORDER — ALUM & MAG HYDROXIDE-SIMETH 200-200-20 MG/5ML PO SUSP: 30.0000 mL | Freq: Four times a day (QID) | ORAL | Status: DC | PRN

## 2014-01-18 MED ORDER — WARFARIN SODIUM 2.5 MG PO TABS: 2.5000 mg | ORAL_TABLET | Freq: Every day | ORAL | Status: DC

## 2014-01-18 MED ORDER — ALBUTEROL SULFATE (2.5 MG/3ML) 0.083% IN NEBU: 2.5000 mg | INHALATION_SOLUTION | Freq: Two times a day (BID) | RESPIRATORY_TRACT | Status: DC

## 2014-01-18 MED ORDER — SODIUM CHLORIDE 0.9 % IV SOLN
INTRAVENOUS | Status: DC
  Administered 2014-01-19: 01:00:00 via INTRAVENOUS

## 2014-01-18 MED ORDER — SODIUM CHLORIDE 0.9 % IV BOLUS (SEPSIS)
1000.0000 mL | INTRAVENOUS | Status: AC
  Administered 2014-01-18 (×2): 1000 mL via INTRAVENOUS

## 2014-01-18 MED ORDER — PRAVASTATIN SODIUM 20 MG PO TABS
20.0000 mg | ORAL_TABLET | ORAL | Status: DC
  Administered 2014-01-21 – 2014-01-31 (×7): 20 mg via ORAL
  Filled 2014-01-18 (×8): qty 1

## 2014-01-18 MED ORDER — DEXTROSE 5 % IV SOLN
1.0000 g | Freq: Once | INTRAVENOUS | Status: AC
  Administered 2014-01-18: 1 g via INTRAVENOUS
  Filled 2014-01-18: qty 1

## 2014-01-18 MED ORDER — SODIUM CHLORIDE 0.9 % IJ SOLN
3.0000 mL | Freq: Two times a day (BID) | INTRAMUSCULAR | Status: DC
  Administered 2014-01-19 – 2014-01-26 (×14): 3 mL via INTRAVENOUS
  Administered 2014-01-28: 10 mL via INTRAVENOUS
  Administered 2014-01-28: 3 mL via INTRAVENOUS

## 2014-01-18 MED ORDER — VANCOMYCIN HCL 10 G IV SOLR
1250.0000 mg | Freq: Once | INTRAVENOUS | Status: AC
  Administered 2014-01-18: 1250 mg via INTRAVENOUS
  Filled 2014-01-18: qty 1250

## 2014-01-18 MED ORDER — ALBUTEROL SULFATE (2.5 MG/3ML) 0.083% IN NEBU
2.5000 mg | INHALATION_SOLUTION | RESPIRATORY_TRACT | Status: DC | PRN
  Administered 2014-01-19 – 2014-01-22 (×4): 2.5 mg via RESPIRATORY_TRACT
  Filled 2014-01-18 (×4): qty 3

## 2014-01-18 MED ORDER — MAGNESIUM OXIDE -MG SUPPLEMENT 500 MG PO TABS: 500.0000 mg | ORAL_TABLET | Freq: Every day | ORAL | Status: DC

## 2014-01-18 MED ORDER — ONDANSETRON HCL 4 MG/2ML IJ SOLN
4.0000 mg | Freq: Four times a day (QID) | INTRAMUSCULAR | Status: DC | PRN
  Administered 2014-01-19 – 2014-01-21 (×4): 4 mg via INTRAVENOUS
  Filled 2014-01-18 (×5): qty 2

## 2014-01-18 MED ORDER — ACYCLOVIR 400 MG PO TABS
400.0000 mg | ORAL_TABLET | Freq: Every day | ORAL | Status: DC
  Administered 2014-01-19 – 2014-01-29 (×10): 400 mg via ORAL
  Filled 2014-01-18 (×11): qty 1

## 2014-01-18 MED ORDER — OMEGA-3-ACID ETHYL ESTERS 1 G PO CAPS
1.0000 g | ORAL_CAPSULE | Freq: Two times a day (BID) | ORAL | Status: DC
  Administered 2014-01-19 – 2014-01-23 (×8): 1 g via ORAL
  Filled 2014-01-18 (×13): qty 1

## 2014-01-18 MED ORDER — ACETAMINOPHEN 500 MG PO TABS
1000.0000 mg | ORAL_TABLET | Freq: Once | ORAL | Status: AC
  Administered 2014-01-18: 1000 mg via ORAL
  Filled 2014-01-18: qty 2

## 2014-01-18 MED ORDER — VANCOMYCIN HCL 500 MG IV SOLR
500.0000 mg | Freq: Two times a day (BID) | INTRAVENOUS | Status: DC
  Administered 2014-01-19 – 2014-01-21 (×5): 500 mg via INTRAVENOUS
  Filled 2014-01-18 (×5): qty 500

## 2014-01-18 MED ORDER — ALBUTEROL SULFATE (2.5 MG/3ML) 0.083% IN NEBU
2.5000 mg | INHALATION_SOLUTION | Freq: Four times a day (QID) | RESPIRATORY_TRACT | Status: DC
  Administered 2014-01-18: 2.5 mg via RESPIRATORY_TRACT
  Filled 2014-01-18: qty 3

## 2014-01-18 MED ORDER — ALLOPURINOL 300 MG PO TABS
300.0000 mg | ORAL_TABLET | Freq: Every day | ORAL | Status: DC
  Administered 2014-01-19 – 2014-01-29 (×10): 300 mg via ORAL
  Filled 2014-01-18 (×4): qty 3
  Filled 2014-01-18: qty 1
  Filled 2014-01-18 (×2): qty 3
  Filled 2014-01-18: qty 1
  Filled 2014-01-18: qty 3
  Filled 2014-01-18: qty 1

## 2014-01-18 NOTE — H&P (Signed)
[facility] Admission History and Physical       DE LIBMAN KGM:[id number] DOB: [birth year] DOA: 01/18/2014  Referring physician: EDP PCP: Horatio Pel, MD  Specialists:   Chief Complaint: Fever  HPI: Patty [name] is a 73 y.o. 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  Her temperature went up to 105.1, and her symptoms were abrupt in onset.  She reports having a cough, but no sputum production.       Review of Systems:  Constitutional: No Weight Loss, No Weight Gain, Night Sweats, +Fevers, +Chills, Dizziness, Fatigue, or Generalized Weakness HEENT: No Headaches, Difficulty Swallowing,Tooth/Dental Problems,Sore Throat,  No Sneezing, Rhinitis, Ear Ache, Nasal Congestion, or Post Nasal Drip,  Cardio-vascular:  No Chest pain, Orthopnea, PND, Edema in Lower Extremities, Anasarca, Dizziness, Palpitations  Resp: No Dyspnea, No DOE, +Cough, No Hemoptysis, No Wheezing.    GI: No Heartburn, Indigestion, Abdominal Pain, Nausea, Vomiting, Diarrhea, Hematemesis, Hematochezia, Melena, Change in Bowel Habits,  Loss of Appetite  GU: No Dysuria, Change in Color of Urine, No Urgency or Frequency, No Flank pain.  Musculoskeletal: No Joint Pain or Swelling, No Decreased Range of Motion, No Back Pain.  Neurologic: No Syncope, No Seizures, Muscle Weakness, Paresthesia, Vision Disturbance or Loss, No Diplopia, No Vertigo, No Difficulty Walking,  Skin: No Rash or Lesions. Psych: No Change in Mood or Affect, No Depression or Anxiety, No Memory loss, No Confusion, or Hallucinations   Past Medical History  Diagnosis Date  . Asthma 06/15/2010  . Hypothyroidism 06/15/2010  . Hyperlipemia 06/15/2010  . Atrial fibrillation 05/28/2010    Managed with rate control and coumadin  . Long-term (current)  use of anticoagulants   . ACE-inhibitor cough   . DI (detrusor instability)   . Fibroid   . Rectocele   . Atrophic vaginitis   . 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 Diabetes mellitus     Type 2  . Peripheral neuropathy   . Hypertension   . Dysrhythmia     cardioversion - 2012  . Complication of anesthesia 1980's    post anesth.- states she "went into resp. arrest" , but then remarked that she thought maybe they gave her too much medicine (morphine)   . Shortness of breath     sometimes   . 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 Anemia   . Malignant lymphoma, follicular 64/4/03  . CPAP (continuous positive airway pressure) dependence   . GERD (gastroesophageal reflux disease)   . Hearing loss   . Sleep apnea     uses CPAP everynight- last study in [place] 5 yrs. or more       Past Surgical History  Procedure Laterality Date  . Appendectomy  1953  . Partial hysterectomy  1972  . Cholecystectomy  1993  . Back surgery      x2  . Cataract extraction    . Cardiac catheterization  10/10/2008    Nonobstructive CAD  . Abdominal hysterectomy  1972    TAH  . Foot surgery    . Tonsillectomy    . Pubovag repair with sling    . Skin tag removal  2012    leg  . Cardioversion  04/13/2011    Procedure: CARDIOVERSION;  Surgeon: Laverda Page, MD;  Location: [facility]  OR;  Service: Cardiovascular;  Laterality: N/A;  . Eye surgery      /w IOL, post cataracts removed   . Mass biopsy Left 12/07/2013    Procedure: EXCISIONAL BIOPSY LEFT NECK MASS ;  Surgeon: Jerrell Belfast, MD;  Location: [facility];  Service: ENT;  Laterality: Left;       Prior to Admission medications   Medication Sig Start Date End Date Taking? Authorizing Provider  acyclovir (ZOVIRAX) 400 MG tablet Take 1 tablet (400 mg total) by mouth daily. 01/08/14  Yes Heath Lark, MD  allopurinol (ZYLOPRIM) 300 MG tablet Take 1 tablet (300 mg total) by mouth daily. 01/08/14  Yes Heath Lark, MD  diltiazem (CARDIZEM CD) 120 MG  24 hr capsule Take 120 mg by mouth daily.  06/07/11  Yes Peter M Martinique, MD  ezetimibe (ZETIA) 10 MG tablet Take 10 mg by mouth daily.    Yes Historical Provider, MD  levothyroxine (SYNTHROID, LEVOTHROID) 125 MCG tablet Take 250 mcg by mouth daily before breakfast.    Yes Historical Provider, MD  metFORMIN (GLUCOPHAGE) 500 MG tablet Take 500 mg by mouth 2 (two) times daily with a meal.   Yes Historical Provider, MD  montelukast (SINGULAIR) 10 MG tablet Take 10 mg by mouth daily.   Yes Historical Provider, MD  ondansetron (ZOFRAN) 8 MG tablet Take 1 tablet (8 mg total) by mouth every 8 (eight) hours as needed. 01/08/14  Yes Heath Lark, MD  potassium chloride SA (K-DUR,KLOR-CON) 20 MEQ tablet Take 1 tablet (20 mEq total) by mouth daily. 12/22/11  Yes Peter M Martinique, MD  pravastatin (PRAVACHOL) 40 MG tablet Take 20 mg by mouth See admin instructions. Take 1/2 tablet (20 mg) on Monday, Tuesday, Wednesday and Thursday   Yes Historical Provider, MD  prochlorperazine (COMPAZINE) 10 MG tablet Take 1 tablet (10 mg total) by mouth every 6 (six) hours as needed (Nausea or vomiting). 01/08/14  Yes Heath Lark, MD  triamterene-hydrochlorothiazide (DYAZIDE) 37.5-25 MG per capsule Take 1 capsule by mouth daily.   Yes Historical Provider, MD  warfarin (COUMADIN) 5 MG tablet Take 2.5-5 mg by mouth daily. Take 1/2 tablet (2.5 mg) on Wednesday and Saturday, take 1 tablet (5 mg) on Monday, Tuesday,Thursday Friday, and Sunday 12/31/10  Yes Peter M Martinique, MD  acetaminophen (TYLENOL) 500 MG tablet Take 1,000 mg by mouth at bedtime as needed (pain).    Historical Provider, MD  albuterol (PROAIR HFA) 108 (90 BASE) MCG/ACT inhaler Inhale 2 puffs into the lungs every 6 (six) hours as needed for wheezing or shortness of breath.    Historical Provider, MD  Cholecalciferol (VITAMIN D) 2000 UNITS tablet Take 2,000 Units by mouth daily with lunch.    Historical Provider, MD  conjugated estrogens (PREMARIN) vaginal cream Place 1  Applicatorful vaginally 3 (three) times a week. Monday, Wednesday and Friday nights    Historical Provider, MD  dicyclomine (BENTYL) 10 MG capsule Take 10 mg by mouth daily as needed (ibs flare up).     Historical Provider, MD  digoxin (LANOXIN) 0.125 MG tablet Take 125 mcg by mouth daily.  06/07/11   Peter M Martinique, MD  Fluticasone-Salmeterol (ADVAIR) 250-50 MCG/DOSE AEPB Inhale 1 puff into the lungs every 12 (twelve) hours.     Historical Provider, MD  hydrochlorothiazide (HYDRODIURIL) 25 MG tablet Take 25 mg by mouth daily as needed (ankle swelling).     Historical Provider, MD  lidocaine-prilocaine (EMLA) cream Apply 1 application topically as needed. Apply to [facility] a Cath site  at least one hour prior to needle stick as needed. 01/04/14   Heath Lark, MD  Magnesium Oxide 500 MG TABS Take 500 mg by mouth at bedtime.    Historical Provider, MD  omega-3 acid ethyl esters (LOVAZA) 1 G capsule Take 1 g by mouth 2 (two) times daily.    Historical Provider, MD  traMADol-acetaminophen (ULTRACET) 37.5-325 MG per tablet Take 1 tablet by mouth every 6 (six) hours as needed. 12/07/13   Jerrell Belfast, MD      Allergies  Allergen Reactions  . Morphine And Related Anaphylaxis and Other (See Comments)    Pt states she stopped breathing post op- "went into resp. arrest"  . Demerol Nausea And Vomiting  . Meperidine Hcl Nausea And Vomiting  . Multaq [Dronedarone] Other (See Comments)    Reaction:  Blood in urine and elevated liver enzymes.   Jeanie Cooks Allergy] Rash  . Penicillins Itching and Rash  . Sulfa Drugs Cross Reactors Rash     Social History:  reports that she has never smoked. She has never used smokeless tobacco. She reports that she does not drink alcohol or use illicit drugs.     Family History  Problem Relation Age of Onset  . Stroke Father     at 48  . Heart attack Father     at 62  . Hypertension Father   . Heart disease Mother   . Arthritis Mother   . Breast cancer  Mother     Age 62  . Heart failure Mother   . Heart attack Brother   . Hypertension Brother   . Diabetes Sister        Physical Exam:  GEN:  Pleasant  Thin Well Developed Elderly  73 y.o.Caucasian female examined and in no acute distress; cooperative with exam Filed Vitals:   01/18/14 1945 01/18/14 1951 01/18/14 2000 01/18/14 2015  BP: 105/45  103/45 104/45  Pulse: 93  92 90  Temp:  98.6 F (37 C)    TempSrc:  Oral    Resp: 20  13 28   SpO2: 100%  99% 100%   Blood pressure 104/45, pulse 90, temperature 98.6 F (37 C), temperature source Oral, resp. rate 28, SpO2 100 %. PSYCH: She is alert and oriented x4; does not appear anxious does not appear depressed; affect is normal HEENT: Normocephalic and Atraumatic, Mucous membranes pink; PERRLA; EOM intact; Fundi:  Benign;  No scleral icterus, Nares: Patent, Oropharynx: Clear, Fair Dentition,    Neck:  FROM, No Cervical Lymphadenopathy nor Thyromegaly or Carotid Bruit; No JVD; Breasts:: Not examined CHEST WALL: No tenderness CHEST: Normal respiration, clear to auscultation bilaterally HEART: Regular rate and rhythm; no murmurs rubs or gallops BACK: No kyphosis or scoliosis; No CVA tenderness ABDOMEN: Positive Bowel Sounds, Soft Non-Tender; No Masses, No Organomegaly Rectal Exam: Not donei EXTREMITIES: No Cyanosis, Clubbing, or Edema; No Ulcerations. Genitalia: not examined PULSES: 2+ and symmetric SKIN: Normal hydration no rash or ulceration CNS:  Alert and Oriented x 4, No Focal Defcits Vascular: pulses palpable throughout    Labs on Admission:  Basic Metabolic Panel:  Recent Labs Lab 01/18/14 1751  NA 137  K 3.5*  CL 99  CO2 24  GLUCOSE 213*  BUN 14  CREATININE 0.75  CALCIUM 9.3   Liver Function Tests:  Recent Labs Lab 01/18/14 1751  AST 21  ALT 29  ALKPHOS 166*  BILITOT 0.7  PROT 6.7  ALBUMIN 3.5   No results for input(s): LIPASE, AMYLASE  in the last 168 hours. No results for input(s): AMMONIA in the  last 168 hours. CBC:  Recent Labs Lab 01/18/14 1751  WBC 21.9*  NEUTROABS 20.1*  HGB 10.8*  HCT 33.4*  MCV 84.3  PLT 86*   Cardiac Enzymes: No results for input(s): CKTOTAL, CKMB, CKMBINDEX, TROPONINI in the last 168 hours.  BNP (last 3 results) No results for input(s): PROBNP in the last 8760 hours. CBG: No results for input(s): GLUCAP in the last 168 hours.  Radiological Exams on Admission: Dg Chest Port 1 View  01/18/2014   CLINICAL DATA:  Fever. Lymphoma diagnosis 1 month ago, first chemotherapy treatment 1 week ago. Decreased level of consciousness.  EXAM: PORTABLE CHEST - 1 VIEW  COMPARISON:  Multiple exams, including 12/31/2013 and 11/26/2013  FINDINGS: Power injectable Port-A-Cath noted on the right side, tip projecting over the upper right atrium. Low lung volumes are present, causing crowding of the pulmonary vasculature. Atherosclerotic aortic arch. Thoracic spondylosis. Given the low lung volumes there is borderline cardiomegaly. Greater than expected retrocardiac density compatible with left lower lobe airspace opacity.  IMPRESSION: 1. Left lower lobe airspace opacity. I do note that the patient had a prior left pleural effusion and some of this could be from layering effusion and atelectasis, but left lower lobe pneumonia is certainly not excluded. 2. Borderline cardiomegaly. 3. Atherosclerotic aortic arch. 4. Thoracic spondylosis.   Electronically Signed   By: Sherryl Barters M.D.   On: 01/18/2014 18:07     EKG: Independently reviewed. 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 female with  Principal Problem:   1.    HCAP (healthcare-associated pneumonia)   IV Vancomycin and Cefepime   Albuterol Nebs   Monitor O2 Sats   O2 PRN   Active Problems:   2.   Atrial fibrillation - on Warfarin Rx, INR = 2.2, and Diltiazem Rx   Monitor     3.   Hyperlipemia   Continue on Pravastatin Rx       4.    Hypothyroidism   Continue Levothyroxine     5.   Long-term (current) use of anticoagulants   Continue coumadin Rx     6.   Sleep apnea   CPAP     7.   Diabetes mellitus type 2, controlled, without complications   Hold Metformin   SSI PRN      8.   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   Anemia in neoplastic disease   Monitor Trend    Code Status:    FULL CODE Family Communication:    No Family Present Disposition Plan:       Inpatient  Time spent: [place] [facility] Pager [phone]  If [place] Please Contact the Day Rounding Team MD for [facility]  If 7PM-7AM, Please Contact Night-Floor Coverage  [email or web address] Password [email or web address] 01/18/2014, 8:30 PM

## 2014-01-18 NOTE — Progress Notes (Signed)
ANTICOAGULATION CONSULT NOTE - Initial Consult  Pharmacy Consult for Warfarin Indication: atrial fibrillation  Allergies  Allergen Reactions  . Morphine And Related Anaphylaxis and Other (See Comments)    Pt states she stopped breathing post op- "went into resp. arrest"  . Demerol Nausea And Vomiting  . Meperidine Hcl Nausea And Vomiting  . Multaq [Dronedarone] Other (See Comments)    Reaction:  Blood in urine and elevated liver enzymes.   Jeanie Cooks Allergy] Rash  . Penicillins Itching and Rash  . Sulfa Drugs Cross Reactors Rash   Patient Measurements: Height: 5' 6.5" (168.9 cm) Weight: 135 lb 5.8 oz (61.4 kg) IBW/kg (Calculated) : 60.45  Vital Signs: Temp: 98.5 F (36.9 C) (11/20 2054) Temp Source: Oral (11/20 2054) BP: 105/52 mmHg (11/20 2054) Pulse Rate: 92 (11/20 2054)  Labs:  Recent Labs  01/18/14 1751  HGB 10.8*  HCT 33.4*  PLT 86*  LABPROT 24.6*  INR 2.20*  CREATININE 0.75   Estimated Creatinine Clearance: 58 mL/min (by C-G formula based on Cr of 0.75).  Medical History: Past Medical History  Diagnosis Date  . Asthma 06/15/2010  . Hypothyroidism 06/15/2010  . Hyperlipemia 06/15/2010  . Atrial fibrillation 05/28/2010    Managed with rate control and coumadin  . Long-term (current) use of anticoagulants   . ACE-inhibitor cough   . DI (detrusor instability)   . Fibroid   . Rectocele   . Atrophic vaginitis   . 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 Diabetes mellitus     Type 2  . Peripheral neuropathy   . Hypertension   . Dysrhythmia     cardioversion - 2012  . Complication of anesthesia 1980's    post anesth.- states she "went into resp. arrest" , but then remarked that she thought maybe they gave her too much medicine (morphine)   . Shortness of breath     sometimes   . 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 Anemia   . Malignant lymphoma, follicular 56/4/33  . CPAP (continuous positive airway pressure) dependence   . GERD  (gastroesophageal reflux disease)   . Hearing loss   . Sleep apnea     uses CPAP everynight- last study in [place] 5 yrs. or more    Medications:  Scheduled:  . [START ON 01/19/2014] acyclovir  400 mg Oral Daily  . [START ON 01/19/2014] albuterol  2.5 mg Nebulization TID  . [START ON 01/19/2014] allopurinol  300 mg Oral Daily  . [START ON 01/19/2014] ceFEPime (MAXIPIME) IV  1 g Intravenous Q8H  . [START ON 01/19/2014] digoxin  125 mcg Oral Daily  . [START ON 01/19/2014] diltiazem  120 mg Oral Daily  . ezetimibe  10 mg Oral Daily  . [START ON 01/19/2014] levothyroxine  250 mcg Oral QAC breakfast  . magnesium oxide  400 mg Oral QHS  . [START ON 01/19/2014] montelukast  10 mg Oral Daily  . omega-3 acid ethyl esters  1 g Oral BID  . potassium chloride SA  20 mEq Oral Daily  . [START ON 01/21/2014] pravastatin  20 mg Oral Once per day on Mon Tue Wed Thu  . sodium chloride  3 mL Intravenous Q12H  . [START ON 01/19/2014] vancomycin  500 mg Intravenous Q12H  . [START ON 01/19/2014] Vitamin D  2,000 Units Oral Q lunch  . warfarin  2.5-5 mg Oral Daily   Assessment: 42 yoF admitted with fever and decreased LOC. Chronic Warfarin for Afib: home  dose 2.5mg  on Wed,Sat with 5mg  other days, last dose 11/20 (5mg  - night of admission).  Admit INR 2.20, in therapeutic range  Goal of Therapy:  INR 2-3   Plan:   No Warfarin tonight  Daily PT/INR starting 11/21  Minda Ditto PharmD Pager [phone] 01/18/2014, 10:32 PM

## 2014-01-18 NOTE — ED Notes (Signed)
Per EMS- Patient is from home. Patient was diagnosed with lymphoma approx 1 month ago and received first chemo treatment 1 week ago. Patient's family reports that the patient has had decreased LOC within the last 3 hours. Patient only able to state her name. Patient c/o nausea and was given Zofran 4 mg IV per EMS.

## 2014-01-18 NOTE — Progress Notes (Signed)
ANTIBIOTIC CONSULT NOTE - INITIAL  Pharmacy Consult for Vancomycin & Cefepime Indication: rule out PNA, sepsis  Allergies  Allergen Reactions  . Morphine And Related Anaphylaxis and Other (See Comments)    States she stopped breathing post op- "went into resp. Arrest"  . Demerol Nausea And Vomiting  . Meperidine Hcl Nausea And Vomiting  . Multaq [Dronedarone] Other (See Comments)    Blood in urine, elevated liver enzymes  . Oysters [Shellfish Allergy] Rash  . Penicillins Itching and Rash  . Sulfa Drugs Cross Reactors Rash   Patient Measurements:   Total body weight: 63kg  Vital Signs: Temp: 105.1 F (40.6 C) (11/20 1732) Temp Source: Rectal (11/20 1732) BP: 159/63 mmHg (11/20 1734) Pulse Rate: 108 (11/20 1734) Intake/Output from previous day:   Intake/Output from this shift:    Labs: No results for input(s): WBC, HGB, PLT, LABCREA, CREATININE in the last 72 hours. Estimated Creatinine Clearance: 56.9 mL/min (by C-G formula based on Cr of 0.8). No results for input(s): VANCOTROUGH, VANCOPEAK, VANCORANDOM, GENTTROUGH, GENTPEAK, GENTRANDOM, TOBRATROUGH, TOBRAPEAK, TOBRARND, AMIKACINPEAK, AMIKACINTROU, AMIKACIN in the last 72 hours.   Microbiology: No results found for this or any previous visit (from the past 720 hour(s)).  Medical History: Past Medical History  Diagnosis Date  . Asthma 06/15/2010  . Hypothyroidism 06/15/2010  . Hyperlipemia 06/15/2010  . Atrial fibrillation 05/28/2010    Managed with rate control and coumadin  . Long-term (current) use of anticoagulants   . ACE-inhibitor cough   . DI (detrusor instability)   . Fibroid   . Rectocele   . Atrophic vaginitis   . 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 Diabetes mellitus     Type 2  . Peripheral neuropathy   . Hypertension   . Dysrhythmia     cardioversion - 2012  . Complication of anesthesia 1980's    post anesth.- states she "went into resp. arrest" , but then remarked that she thought maybe  they gave her too much medicine (morphine)   . Shortness of breath     sometimes   . 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 Anemia   . 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 CPAP (continuous positive airway pressure) dependence   . GERD (gastroesophageal reflux disease)   . Hearing loss   . Sleep apnea     uses CPAP everynight- last study in [place] 5 yrs. or more    Medications:  Scheduled:   Anti-infectives    Start     Dose/Rate Route Frequency Ordered Stop   01/18/14 1930  vancomycin (VANCOCIN) 1,250 mg in sodium chloride 0.9 % 250 mL IVPB     1,250 mg166.7 mL/hr over 90 Minutes Intravenous  Once 01/18/14 1823     01/18/14 1830  ceFEPIme (MAXIPIME) 1 g in dextrose 5 % 50 mL IVPB     1 g100 mL/hr over 30 Minutes Intravenous  Once 01/18/14 1822       Assessment: 77 yoF with fever and decreased LOC. Recent chemo for newly diagnosed Lymphoma; Cycle 1 Rituxan d1, Bendamustine d1,2 q 28 days on 11/11 & 11/12.   Febrile to 105.1, Leukocytosis 21.9K, CXray possible PNA  Vancomycin & Cefepime per pharmacy  Renal function wnl  Goal of Therapy:  Vancomycin trough level 15-20 mcg/ml  Plan:   Vancomycin 1250mg  x1 in ED, then 500mg  q12  Cefepime 1gm q8hr, first dose in ED  Follow blood, urine cultures, response to abx  De-escalate abx therapy when able  Thank you for the consult  Minda Ditto PharmD Pager [phone] 01/18/2014, 8:05 PM

## 2014-01-18 NOTE — ED Provider Notes (Signed)
TIME SEEN: 5:45 PM  CHIEF COMPLAINT: Fever  HPI: Pt is a 73 y.o. F with history of atrial fibrillation on Coumadin, hypertension, hyperlipidemia, hypothyroidism, diabetes, follicular lymphoma who last received chemotherapy 1 week ago who presents emergency department with fever that started today. Patient denies any pain. Per EMS, patient's family reported that she had been less arousable for the past several hours. She was complaining of nausea with EMS but no vomiting. No complaints of diarrhea or cough. No headache, neck pain or neck stiffness. No chest or abdominal pain.  ROS: See HPI Constitutional:  fever  Eyes: no drainage  ENT: no runny nose   Cardiovascular:  no chest pain  Resp: no SOB  GI: no vomiting GU: no dysuria Integumentary: no rash  Allergy: no hives  Musculoskeletal: no leg swelling  Neurological: no slurred speech ROS otherwise negative  PAST MEDICAL HISTORY/PAST SURGICAL HISTORY:  Past Medical History  Diagnosis Date  . Asthma 06/15/2010  . Hypothyroidism 06/15/2010  . Hyperlipemia 06/15/2010  . Atrial fibrillation 05/28/2010    Managed with rate control and coumadin  . Long-term (current) use of anticoagulants   . ACE-inhibitor cough   . DI (detrusor instability)   . Fibroid   . Rectocele   . Atrophic vaginitis   . 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 Diabetes mellitus     Type 2  . Peripheral neuropathy   . Hypertension   . Dysrhythmia     cardioversion - 2012  . Complication of anesthesia 1980's    post anesth.- states she "went into resp. arrest" , but then remarked that she thought maybe they gave her too much medicine (morphine)   . Shortness of breath     sometimes   . 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 Anemia   . Malignant lymphoma, follicular 88/5/02  . CPAP (continuous positive airway pressure) dependence   . GERD (gastroesophageal reflux disease)   . Hearing loss   . Sleep apnea     uses CPAP everynight- last study in [place] 5  yrs. or more     MEDICATIONS:  Prior to Admission medications   Medication Sig Start Date End Date Taking? Authorizing Provider  acetaminophen (TYLENOL) 500 MG tablet Take 1,000 mg by mouth at bedtime as needed (pain).    Historical Provider, MD  acyclovir (ZOVIRAX) 400 MG tablet Take 1 tablet (400 mg total) by mouth daily. 01/08/14   Heath Lark, MD  albuterol (PROAIR HFA) 108 (90 BASE) MCG/ACT inhaler Inhale 2 puffs into the lungs every 6 (six) hours as needed for wheezing or shortness of breath.    Historical Provider, MD  allopurinol (ZYLOPRIM) 300 MG tablet Take 1 tablet (300 mg total) by mouth daily. 01/08/14   Heath Lark, MD  Cholecalciferol (VITAMIN D) 2000 UNITS tablet Take 2,000 Units by mouth daily with lunch.    Historical Provider, MD  conjugated estrogens (PREMARIN) vaginal cream Place 1 Applicatorful vaginally 3 (three) times a week. Monday, Wednesday and Friday nights    Historical Provider, MD  dicyclomine (BENTYL) 10 MG capsule Take 10 mg by mouth daily as needed (ibs flare up).     Historical Provider, MD  digoxin (LANOXIN) 0.125 MG tablet Take 125 mcg by mouth daily.  06/07/11   Peter M Martinique, MD  diltiazem (CARDIZEM CD) 120 MG 24 hr capsule Take 120 mg by mouth daily.  06/07/11   Peter M Martinique, MD  ezetimibe (ZETIA) 10 MG tablet Take 5  mg by mouth daily with supper.     Historical Provider, MD  Fluticasone-Salmeterol (ADVAIR) 250-50 MCG/DOSE AEPB Inhale 1 puff into the lungs every 12 (twelve) hours.     Historical Provider, MD  hydrochlorothiazide (HYDRODIURIL) 25 MG tablet Take 25 mg by mouth daily as needed (ankle swelling).     Historical Provider, MD  levothyroxine (SYNTHROID, LEVOTHROID) 125 MCG tablet Take 250 mcg by mouth daily before breakfast.     Historical Provider, MD  lidocaine-prilocaine (EMLA) cream Apply 1 application topically as needed. Apply to [facility] a Cath site at least one hour prior to needle stick as needed. 01/04/14   Heath Lark, MD  Magnesium Oxide 500 MG  TABS Take 500 mg by mouth at bedtime.    Historical Provider, MD  metFORMIN (GLUCOPHAGE) 500 MG tablet Take 500 mg by mouth 2 (two) times daily with a meal.    Historical Provider, MD  montelukast (SINGULAIR) 10 MG tablet Take 10 mg by mouth daily.    Historical Provider, MD  omega-3 acid ethyl esters (LOVAZA) 1 G capsule Take 1 g by mouth 2 (two) times daily.    Historical Provider, MD  ondansetron (ZOFRAN) 8 MG tablet Take 1 tablet (8 mg total) by mouth every 8 (eight) hours as needed. 01/08/14   Heath Lark, MD  potassium chloride SA (K-DUR,KLOR-CON) 20 MEQ tablet Take 1 tablet (20 mEq total) by mouth daily. 12/22/11   Peter M Martinique, MD  pravastatin (PRAVACHOL) 40 MG tablet Take 20 mg by mouth See admin instructions. Take 1/2 tablet (20 mg) on Monday, Tuesday, Wednesday and Thursday    Historical Provider, MD  prochlorperazine (COMPAZINE) 10 MG tablet Take 1 tablet (10 mg total) by mouth every 6 (six) hours as needed (Nausea or vomiting). 01/08/14   Heath Lark, MD  traMADol-acetaminophen (ULTRACET) 37.5-325 MG per tablet Take 1 tablet by mouth every 6 (six) hours as needed. 12/07/13   Jerrell Belfast, MD  warfarin (COUMADIN) 5 MG tablet Take 2.5-5 mg by mouth daily. Take 1/2 tablet (2.5 mg) on Tuesday and Thursday, take 1 tablet (5 mg) on Monday, Wednesday, Friday, Saturday and Sunday 12/31/10   Peter M Martinique, MD    ALLERGIES:  Allergies  Allergen Reactions  . Demerol Nausea And Vomiting  . Meperidine Hcl Nausea And Vomiting  . Morphine And Related Other (See Comments)    States she stopped breathing post op- "went into resp. Arrest"  . Multaq [Dronedarone] Other (See Comments)    Blood in urine, elevated liver enzymes  . Oysters [Shellfish Allergy] Rash  . Penicillins Itching and Rash  . Sulfa Drugs Cross Reactors Rash    SOCIAL HISTORY:  History  Substance Use Topics  . Smoking status: Never Smoker   . Smokeless tobacco: Never Used  . Alcohol Use: No    FAMILY HISTORY: Family  History  Problem Relation Age of Onset  . Stroke Father     at 69  . Heart attack Father     at 66  . Hypertension Father   . Heart disease Mother   . Arthritis Mother   . Breast cancer Mother     Age 64  . Heart failure Mother   . Heart attack Brother   . Hypertension Brother   . Diabetes Sister     EXAM: BP 159/63 mmHg  Pulse 110  Temp(Src) 105.1 F (40.6 C) (Rectal)  Resp 32  SpO2 99% CONSTITUTIONAL: Alert and oriented to person and place but not time and responds  appropriately to questions. In no apparent distress, somnolent but easily arousable with voice HEAD: Normocephalic EYES: Conjunctivae clear, PERRL ENT: normal nose; no rhinorrhea; moist mucous membranes; pharynx without lesions noted NECK: Supple, no meningismus, no LAD  CARD: Regular and tachycardic; S1 and S2 appreciated; no murmurs, no clicks, no rubs, no gallops RESP: Normal chest excursion without splinting or tachypneic with bibasilar crackles, breath equal bilaterally; no wheezes, no rhonchi, no rales, no hypoxia or respiratory distress, speaking full sentences ABD/GI: Normal bowel sounds; non-distended; soft, non-tender, no rebound, no guarding BACK:  The back appears normal and is non-tender to palpation, there is no CVA tenderness EXT: Normal ROM in all joints; non-tender to palpation; no edema; normal capillary refill; no cyanosis    SKIN: Normal color for age and race; warm; no rash, no signs of cellulitis or abscess NEURO: Moves all extremities equally; somnolent but easily arousable with voice, no facial droop or slurred speech PSYCH: The patient's mood and manner are appropriate. Grooming and personal hygiene are appropriate.  MEDICAL DECISION MAKING: Pt here with fever, recent chemotherapy 1 week ago.  She meets SIRS criteria.  Will give 2 L IV fluids, broad-spectrum antibiotics, obtain labs, cultures, chest x-ray and urine. She will need admission.  ED PROGRESS: Pt has PNA, leukocytosis of 21. Her  breathing and tachypnea are improving. Blood pressure still stable.  Will d/w medicine for admission.  7:25 PM  D/w Dr. Arnoldo Morale for admission to tele, inpt.  ED Sepsis - Repeat Assessment   Performed at:    January 18, 2014, 6:54 PM   Last Vitals:    Blood pressure 132/50, pulse 104, temperature 105.1 F (40.6 C), temperature source Rectal, resp. rate 28, SpO2 100 %.  Heart:      Regular and tachycardic  Lungs:     Bibasilar crackles  Capillary Refill:   Normal less than 2 seconds  Peripheral Pulse (include location): 2+ radial pulses bilaterally   Skin (include color):   Warm       Date: 01/18/2014 17:34  Rate: 110  Rhythm: Sinus tachycardia  QRS Axis: normal  Intervals: normal  ST/T Wave abnormalities: normal  Conduction Disutrbances: none  Narrative Interpretation: Sinus tachycardia, nonspecific ST flattening diffusely, Q waves in anterior and inferior leads     CRITICAL CARE Performed by: Nyra Jabs   Total critical care time: 45 minutes  Critical care time was exclusive of separately billable procedures and treating other patients.  Critical care was necessary to treat or prevent imminent or life-threatening deterioration.  Critical care was time spent personally by me on the following activities: development of treatment plan with patient and/or surrogate as well as nursing, discussions with consultants, evaluation of patient's response to treatment, examination of patient, obtaining history from patient or surrogate, ordering and performing treatments and interventions, ordering and review of laboratory studies, ordering and review of radiographic studies, pulse oximetry and re-evaluation of patient's condition.     [place], DO 01/18/14 1925

## 2014-01-19 ENCOUNTER — Encounter (HOSPITAL_COMMUNITY): Payer: Self-pay

## 2014-01-19 DIAGNOSIS — E039 Hypothyroidism, unspecified: Secondary | ICD-10-CM

## 2014-01-19 LAB — BASIC METABOLIC PANEL
ANION GAP: 12 (ref 5–15)
BUN: 16 mg/dL (ref 6–23)
CHLORIDE: 104 meq/L (ref 96–112)
CO2: 20 mEq/L (ref 19–32)
Calcium: 7.8 mg/dL — ABNORMAL LOW (ref 8.4–10.5)
Creatinine, Ser: 0.72 mg/dL (ref 0.50–1.10)
GFR calc non Af Amer: 82 mL/min — ABNORMAL LOW (ref >90)
Glucose, Bld: 207 mg/dL — ABNORMAL HIGH (ref 70–99)
POTASSIUM: 3.4 meq/L — AB (ref 3.7–5.3)
SODIUM: 136 meq/L — AB (ref 137–147)

## 2014-01-19 LAB — PROTIME-INR
INR: 2.23 — ABNORMAL HIGH (ref 0.00–1.49)
PROTHROMBIN TIME: 24.9 s — AB (ref 11.6–15.2)

## 2014-01-19 LAB — CBC
HCT: 28.1 % — ABNORMAL LOW (ref 36.0–46.0)
HEMOGLOBIN: 9.2 g/dL — AB (ref 12.0–15.0)
MCH: 27.5 pg (ref 26.0–34.0)
MCHC: 32.7 g/dL (ref 30.0–36.0)
MCV: 84.1 fL (ref 78.0–100.0)
PLATELETS: 67 10*3/uL — AB (ref 150–400)
RBC: 3.34 MIL/uL — AB (ref 3.87–5.11)
RDW: 14.9 % (ref 11.5–15.5)
WBC: 26.4 10*3/uL — ABNORMAL HIGH (ref 4.0–10.5)

## 2014-01-19 LAB — GLUCOSE, CAPILLARY
GLUCOSE-CAPILLARY: 189 mg/dL — AB (ref 70–99)
GLUCOSE-CAPILLARY: 179 mg/dL — AB (ref 70–99)

## 2014-01-19 LAB — INFLUENZA PANEL BY PCR (TYPE A & B)
H1N1 flu by pcr: NOT DETECTED
Influenza A By PCR: NEGATIVE
Influenza B By PCR: NEGATIVE

## 2014-01-19 MED ORDER — WARFARIN SODIUM 2.5 MG PO TABS
2.5000 mg | ORAL_TABLET | Freq: Once | ORAL | Status: AC
  Administered 2014-01-19: 2.5 mg via ORAL
  Filled 2014-01-19: qty 1

## 2014-01-19 MED ORDER — INSULIN ASPART 100 UNIT/ML ~~LOC~~ SOLN
0.0000 [IU] | Freq: Three times a day (TID) | SUBCUTANEOUS | Status: DC
  Administered 2014-01-19 – 2014-01-20 (×2): 2 [IU] via SUBCUTANEOUS
  Administered 2014-01-20: 3 [IU] via SUBCUTANEOUS
  Administered 2014-01-20: 2 [IU] via SUBCUTANEOUS

## 2014-01-19 MED ORDER — DIPHENHYDRAMINE HCL 25 MG PO CAPS
25.0000 mg | ORAL_CAPSULE | Freq: Once | ORAL | Status: AC
  Administered 2014-01-19: 25 mg via ORAL
  Filled 2014-01-19: qty 1

## 2014-01-19 MED ORDER — PROMETHAZINE HCL 25 MG/ML IJ SOLN
12.5000 mg | INTRAMUSCULAR | Status: DC | PRN
  Administered 2014-01-19 – 2014-01-30 (×19): 12.5 mg via INTRAVENOUS
  Filled 2014-01-19 (×18): qty 1

## 2014-01-19 MED ORDER — SODIUM CHLORIDE 0.9 % IV BOLUS (SEPSIS)
500.0000 mL | Freq: Once | INTRAVENOUS | Status: AC
  Administered 2014-01-19: 500 mL via INTRAVENOUS

## 2014-01-19 MED ORDER — KETOROLAC TROMETHAMINE 30 MG/ML IJ SOLN
30.0000 mg | INTRAMUSCULAR | Status: AC
  Administered 2014-01-19: 30 mg via INTRAVENOUS
  Filled 2014-01-19: qty 1

## 2014-01-19 MED ORDER — INSULIN ASPART 100 UNIT/ML ~~LOC~~ SOLN
0.0000 [IU] | Freq: Every day | SUBCUTANEOUS | Status: DC
  Administered 2014-01-20: 3 [IU] via SUBCUTANEOUS

## 2014-01-19 MED ORDER — MOMETASONE FURO-FORMOTEROL FUM 100-5 MCG/ACT IN AERO
2.0000 | INHALATION_SPRAY | Freq: Two times a day (BID) | RESPIRATORY_TRACT | Status: DC
  Administered 2014-01-19 – 2014-01-20 (×3): 2 via RESPIRATORY_TRACT
  Filled 2014-01-19: qty 8.8

## 2014-01-19 MED ORDER — SODIUM CHLORIDE 0.45 % IV BOLUS
1000.0000 mL | Freq: Once | INTRAVENOUS | Status: AC
  Administered 2014-01-19: 1000 mL via INTRAVENOUS

## 2014-01-19 MED ORDER — WARFARIN - PHARMACIST DOSING INPATIENT: Freq: Every day | Status: DC

## 2014-01-19 NOTE — Progress Notes (Signed)
CRITICAL VALUE ALERT  Critical value received:  Positive blood cultures x 2 with gram negative rods  Date of notification:  01/19/14  Time of notification: 5051  Critical value read back:Yes.    Nurse who received alert:  Eduard Clos  MD notified (1st page):  Dr. Rockne Menghini  Time of first page: 58  MD notified (2nd page):  Time of second page:  Responding MD: Dr. Rockne Menghini  Time MD responded: [phone]

## 2014-01-19 NOTE — Progress Notes (Signed)
MD notified of blood culture results. Also asked to clarify IV fluids orders. Said to infuse at 75 ml/hr. Rate changed. No orders given for antibiotics. Pt already receiving IV antibiotics.  Vw, rn.

## 2014-01-19 NOTE — Progress Notes (Signed)
Progress Note   EYONNA [name] QTM:[id number] DOB: [birth year] DOA: 01/18/2014 PCP: Horatio Pel, MD   Brief Narrative:   Patty [name] is an 73 y.o. female with a PMH of high-grade follicular lymphoma diagnosed 11/2013, status post treatment with rituximab and bendamustine 01/10/14 followed by Neulasta support who was admitted on 01/10/14 with chief complaint of fever. Chest x-ray done on admission showed a left lower lobe opacity concerning for healthcare associated pneumonia.  Assessment/Plan:   Principal Problem:   Sepsis secondary to HCAP (healthcare-associated pneumonia)  Continue empiric antibiotics consisting of vancomycin and cefepime.  Continue nebulized bronchodilator therapy as needed.  Follow-up influenza panel, blood cultures.  Active Problems:   Diarrhea  Likely secondary to chemo, but given elevated WBC, rule out C. Diff.    Atrial fibrillation / long-term (current) use of anticoagulants  Continue digoxin, diltiazem.  Coumadin per pharmacy.    Hyperlipemia  Continues zetia and Pravachol.    Hypothyroidism  Continue Synthroid.    Sleep apnea  Continue CPAP daily at bedtime.    Diabetes mellitus type 2, controlled, without complications  Hold metformin.  Start SSI, insulin sensitive scale.    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  Continue allopurinol for prevention of tumor lysis/elevated uric acid.  Continue acyclovir prophylaxis.    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 No current indication for transfusion.    DVT Prophylaxis  On chronic Coumadin.  Code Status: Full. Family Communication: No family at the bedside. Disposition Plan: Home when stable.   IV Access:    Port-A-Cath   Procedures and diagnostic studies:   Dg Chest Port 1 View 01/18/2014: 1. Left lower lobe airspace opacity. I do note that  the patient had a prior left pleural effusion and some of this could be from layering effusion and atelectasis, but left lower lobe pneumonia is certainly not excluded. 2. Borderline cardiomegaly. 3. Atherosclerotic aortic arch. 4. Thoracic spondylosis.   Medical Consultants:    None.  Anti-Infectives:    Cefepime 01/18/14--->  Vancomycin 01/18/14--->  Subjective:   Patty [name] reports dyspnea but no cough.  She had 3 loose stools last night.  She has been nauseated, but has not vomited.  She reports a diffuse non-pruritic rash.  Objective:    Filed Vitals:   01/19/14 0209 01/19/14 0337 01/19/14 0352 01/19/14 0614  BP: 98/47  117/74 119/52  Pulse: 87  117 104  Temp: 97.9 F (36.6 C)  98 F (36.7 C) 105.2 F (40.7 C)  TempSrc: Oral  Oral Rectal  Resp: 22  22 22   Height:      Weight:      SpO2: 96% 96% 98% 90%    Intake/Output Summary (Last 24 hours) at 01/19/14 0804 Last data filed at 01/19/14 6256  Gross per 24 hour  Intake   2550 ml  Output      5 ml  Net   2545 ml    Exam: Gen:  NAD Skin: Diffuse macular rash Cardiovascular:  Tachycardic, No M/R/G Respiratory:  Lungs with bibasilar crackles Gastrointestinal:  Abdomen soft, NT/ND, + BS Extremities:  No C/E/C   Data Reviewed:    Labs: Basic Metabolic Panel:  Recent Labs Lab 01/18/14 1751  NA 137  K 3.5*  CL 99  CO2 24  GLUCOSE 213*  BUN 14  CREATININE 0.75  CALCIUM 9.3   GFR Estimated Creatinine Clearance: 58  mL/min (by C-G formula based on Cr of 0.75). Liver Function Tests:  Recent Labs Lab 01/18/14 1751  AST 21  ALT 29  ALKPHOS 166*  BILITOT 0.7  PROT 6.7  ALBUMIN 3.5   Coagulation profile  Recent Labs Lab 01/18/14 1751 01/19/14 0726  INR 2.20* 2.23*    CBC:  Recent Labs Lab 01/18/14 1751 01/19/14 0726  WBC 21.9* 26.4*  NEUTROABS 20.1*  --   HGB 10.8* 9.2*  HCT 33.4* 28.1*  MCV 84.3 84.1  PLT 86* 67*   CBG: No results for input(s): GLUCAP in the last  168 hours.  Sepsis Labs:  Recent Labs Lab 01/18/14 1751 01/18/14 1758 01/19/14 0726  WBC 21.9*  --  26.4*  LATICACIDVEN  --  1.05  --    Microbiology No results found for this or any previous visit (from the past 240 hour(s)).   Medications:   . acyclovir  400 mg Oral Daily  . albuterol  2.5 mg Nebulization TID  . allopurinol  300 mg Oral Daily  . ceFEPime (MAXIPIME) IV  1 g Intravenous Q8H  . cholecalciferol  2,000 Units Oral Q lunch  . digoxin  125 mcg Oral Daily  . diltiazem  120 mg Oral Daily  . ezetimibe  10 mg Oral Daily  . levothyroxine  250 mcg Oral QAC breakfast  . magnesium oxide  400 mg Oral QHS  . montelukast  10 mg Oral Daily  . omega-3 acid ethyl esters  1 g Oral BID  . potassium chloride SA  20 mEq Oral Daily  . [START ON 01/21/2014] pravastatin  20 mg Oral Once per day on Mon Tue Wed Thu  . sodium chloride  3 mL Intravenous Q12H  . vancomycin  500 mg Intravenous Q12H   Continuous Infusions: . sodium chloride 125 mL/hr at 01/18/14 1803  . sodium chloride 75 mL/hr at 01/19/14 0110    Time spent: 35 minutes with > 50% of time discussing current diagnostic test results, clinical impression and plan of care.    LOS: 1 day   Kabrea Seeney  [facility] Pager [phone]. If unable to reach me by pager, please call my cell phone at [phone].  *Please refer to [email or web address], password [email or web address] to get updated schedule on who will round on this patient, as hospitalists switch teams weekly. If 7PM-7AM, please contact night-coverage at [email or web address], password [email or web address] for any overnight needs.  01/19/2014, 8:04 AM

## 2014-01-19 NOTE — Progress Notes (Signed)
Pt c/o of "feeling bad on my stomach". Iv ondansetron given with no relief. Pt conts dry heave with complaints of feeling bad on her stomach. Also has an increase rash now to her abdomen and under breasts. MD made aware of these findings. New orders given. Pt made aware. vw, rn.

## 2014-01-19 NOTE — Progress Notes (Signed)
ANTICOAGULATION CONSULT NOTE - Follow Up  Pharmacy Consult for Warfarin Indication: atrial fibrillation  Allergies  Allergen Reactions  . Morphine And Related Anaphylaxis and Other (See Comments)    Pt states she stopped breathing post op- "went into resp. arrest"  . Demerol Nausea And Vomiting  . Meperidine Hcl Nausea And Vomiting  . Multaq [Dronedarone] Other (See Comments)    Reaction:  Blood in urine and elevated liver enzymes.   Jeanie Cooks Allergy] Rash  . Penicillins Itching and Rash  . Sulfa Drugs Cross Reactors Rash   Patient Measurements: Height: 5' 6.5" (168.9 cm) Weight: 135 lb 5.8 oz (61.4 kg) IBW/kg (Calculated) : 60.45  Vital Signs: Temp: 105.2 F (40.7 C) (11/21 0614) Temp Source: Rectal (11/21 3810) BP: 119/52 mmHg (11/21 0614) Pulse Rate: 104 (11/21 0614)  Labs:  Recent Labs  01/18/14 1751 01/19/14 0726  HGB 10.8* 9.2*  HCT 33.4* 28.1*  PLT 86* 67*  LABPROT 24.6* 24.9*  INR 2.20* 2.23*  CREATININE 0.75 0.72   Estimated Creatinine Clearance: 58 mL/min (by C-G formula based on Cr of 0.72).  Medical History: Past Medical History  Diagnosis Date  . Asthma 06/15/2010  . Hypothyroidism 06/15/2010  . Hyperlipemia 06/15/2010  . Atrial fibrillation 05/28/2010    Managed with rate control and coumadin  . Long-term (current) use of anticoagulants   . ACE-inhibitor cough   . DI (detrusor instability)   . Fibroid   . Rectocele   . Atrophic vaginitis   . 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 Diabetes mellitus     Type 2  . Peripheral neuropathy   . Hypertension   . Dysrhythmia     cardioversion - 2012  . Complication of anesthesia 1980's    post anesth.- states she "went into resp. arrest" , but then remarked that she thought maybe they gave her too much medicine (morphine)   . Shortness of breath     sometimes   . 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 Anemia   . Malignant lymphoma, follicular 17/5/10  . CPAP (continuous positive  airway pressure) dependence   . GERD (gastroesophageal reflux disease)   . Hearing loss   . Sleep apnea     uses CPAP everynight- last study in [place] 5 yrs. or more    Medications:  Scheduled:  . acyclovir  400 mg Oral Daily  . albuterol  2.5 mg Nebulization TID  . allopurinol  300 mg Oral Daily  . ceFEPime (MAXIPIME) IV  1 g Intravenous Q8H  . cholecalciferol  2,000 Units Oral Q lunch  . digoxin  125 mcg Oral Daily  . diltiazem  120 mg Oral Daily  . ezetimibe  10 mg Oral Daily  . insulin aspart  0-5 Units Subcutaneous QHS  . insulin aspart  0-9 Units Subcutaneous TID WC  . levothyroxine  250 mcg Oral QAC breakfast  . magnesium oxide  400 mg Oral QHS  . mometasone-formoterol  2 puff Inhalation BID  . montelukast  10 mg Oral Daily  . omega-3 acid ethyl esters  1 g Oral BID  . potassium chloride SA  20 mEq Oral Daily  . [START ON 01/21/2014] pravastatin  20 mg Oral Once per day on Mon Tue Wed Thu  . sodium chloride  3 mL Intravenous Q12H  . vancomycin  500 mg Intravenous Q12H  Inpatient warfarin doses administered: 11/20: (already took 5mg  prior to admission, no additional given).   Assessment: 13 yoF  admitted with sepsis secondary to HCAP following first course of chemotherapy for NHL. On chronic Warfarin for Afib: home dose 2.5mg  on Wed, Sat with 5mg  other days, last dose 11/20 (5mg  - night of admission). Admit INR 2.20, in therapeutic range.    Goal of Therapy:  INR 2-3  Today, 11/21:  INR therapeutic (2.23)  Pltc continues to fall post-chemotherapy, but remains >50K.  Hgb continues to fall post-chemotherapy.  No bleeding reported in latest chart note.  On cardiac diet.  1. Warfarin 2.5mg  PO x 1 today as per patient's usual regimen 2. Follow daily PT/INR 3. Follow CBC - watch pltc post-chemotherapy 4. Follow clinical course and any reports of bleeding.  Clayburn Pert, PharmD, BCPS Pager: [phone] 01/19/2014  9:16 AM

## 2014-01-20 ENCOUNTER — Inpatient Hospital Stay (HOSPITAL_COMMUNITY): Payer: Medicare Other

## 2014-01-20 DIAGNOSIS — E119 Type 2 diabetes mellitus without complications: Secondary | ICD-10-CM

## 2014-01-20 DIAGNOSIS — I480 Paroxysmal atrial fibrillation: Secondary | ICD-10-CM

## 2014-01-20 DIAGNOSIS — R7881 Bacteremia: Secondary | ICD-10-CM

## 2014-01-20 DIAGNOSIS — A419 Sepsis, unspecified organism: Secondary | ICD-10-CM

## 2014-01-20 DIAGNOSIS — J189 Pneumonia, unspecified organism: Secondary | ICD-10-CM

## 2014-01-20 DIAGNOSIS — R197 Diarrhea, unspecified: Secondary | ICD-10-CM

## 2014-01-20 DIAGNOSIS — C8591 Non-Hodgkin lymphoma, unspecified, lymph nodes of head, face, and neck: Secondary | ICD-10-CM

## 2014-01-20 DIAGNOSIS — R6520 Severe sepsis without septic shock: Secondary | ICD-10-CM

## 2014-01-20 LAB — BASIC METABOLIC PANEL
ANION GAP: 16 — AB (ref 5–15)
BUN: 38 mg/dL — ABNORMAL HIGH (ref 6–23)
CALCIUM: 7.5 mg/dL — AB (ref 8.4–10.5)
CHLORIDE: 105 meq/L (ref 96–112)
CO2: 14 meq/L — AB (ref 19–32)
CREATININE: 2.06 mg/dL — AB (ref 0.50–1.10)
GFR calc Af Amer: 26 mL/min — ABNORMAL LOW (ref >90)
GFR calc non Af Amer: 22 mL/min — ABNORMAL LOW (ref >90)
GLUCOSE: 269 mg/dL — AB (ref 70–99)
Potassium: 4.2 mEq/L (ref 3.7–5.3)
SODIUM: 135 meq/L — AB (ref 137–147)

## 2014-01-20 LAB — GLUCOSE, CAPILLARY
GLUCOSE-CAPILLARY: 173 mg/dL — AB (ref 70–99)
GLUCOSE-CAPILLARY: 193 mg/dL — AB (ref 70–99)
GLUCOSE-CAPILLARY: 234 mg/dL — AB (ref 70–99)
GLUCOSE-CAPILLARY: 264 mg/dL — AB (ref 70–99)

## 2014-01-20 LAB — CBC
HCT: 31.2 % — ABNORMAL LOW (ref 36.0–46.0)
HEMOGLOBIN: 10.1 g/dL — AB (ref 12.0–15.0)
MCH: 27.6 pg (ref 26.0–34.0)
MCHC: 32.4 g/dL (ref 30.0–36.0)
MCV: 85.2 fL (ref 78.0–100.0)
PLATELETS: 69 10*3/uL — AB (ref 150–400)
RBC: 3.66 MIL/uL — AB (ref 3.87–5.11)
RDW: 15.3 % (ref 11.5–15.5)
WBC: 37.9 10*3/uL — AB (ref 4.0–10.5)

## 2014-01-20 LAB — PROTIME-INR
INR: 2.06 — ABNORMAL HIGH (ref 0.00–1.49)
Prothrombin Time: 23.4 seconds — ABNORMAL HIGH (ref 11.6–15.2)

## 2014-01-20 LAB — PRO B NATRIURETIC PEPTIDE: Pro B Natriuretic peptide (BNP): 36152 pg/mL — ABNORMAL HIGH (ref 0–450)

## 2014-01-20 LAB — CLOSTRIDIUM DIFFICILE BY PCR: Toxigenic C. Difficile by PCR: NEGATIVE

## 2014-01-20 LAB — LACTIC ACID, PLASMA: LACTIC ACID, VENOUS: 3.4 mmol/L — AB (ref 0.5–2.2)

## 2014-01-20 MED ORDER — METRONIDAZOLE IN NACL 5-0.79 MG/ML-% IV SOLN
500.0000 mg | Freq: Three times a day (TID) | INTRAVENOUS | Status: DC
  Administered 2014-01-20 – 2014-01-21 (×3): 500 mg via INTRAVENOUS
  Filled 2014-01-20 (×5): qty 100

## 2014-01-20 MED ORDER — WARFARIN SODIUM 5 MG PO TABS
5.0000 mg | ORAL_TABLET | Freq: Once | ORAL | Status: AC
  Administered 2014-01-20: 5 mg via ORAL
  Filled 2014-01-20: qty 1

## 2014-01-20 MED ORDER — SODIUM CHLORIDE 0.9 % IV BOLUS (SEPSIS)
500.0000 mL | Freq: Once | INTRAVENOUS | Status: AC
  Administered 2014-01-20: 500 mL via INTRAVENOUS

## 2014-01-20 MED ORDER — PROCHLORPERAZINE EDISYLATE 5 MG/ML IJ SOLN
10.0000 mg | Freq: Once | INTRAMUSCULAR | Status: AC
  Administered 2014-01-20: 10 mg via INTRAVENOUS
  Filled 2014-01-20: qty 2

## 2014-01-20 MED ORDER — NOREPINEPHRINE BITARTRATE 1 MG/ML IV SOLN
2.0000 ug/min | INTRAVENOUS | Status: DC
  Administered 2014-01-20: 15 ug/min via INTRAVENOUS
  Administered 2014-01-20: 10 ug/min via INTRAVENOUS
  Administered 2014-01-20: 12 ug/min via INTRAVENOUS
  Administered 2014-01-21: 8 ug/min via INTRAVENOUS
  Administered 2014-01-21: 20 ug/min via INTRAVENOUS
  Administered 2014-01-21: 13 ug/min via INTRAVENOUS
  Administered 2014-01-22: 10 ug/min via INTRAVENOUS
  Administered 2014-01-23: 2 ug/min via INTRAVENOUS
  Filled 2014-01-20 (×9): qty 4

## 2014-01-20 MED ORDER — METOCLOPRAMIDE HCL 5 MG/ML IJ SOLN
10.0000 mg | Freq: Once | INTRAMUSCULAR | Status: AC
  Administered 2014-01-20: 10 mg via INTRAVENOUS
  Filled 2014-01-20: qty 2

## 2014-01-20 MED ORDER — HYDROCORTISONE NA SUCCINATE PF 100 MG IJ SOLR
50.0000 mg | Freq: Four times a day (QID) | INTRAMUSCULAR | Status: DC
  Administered 2014-01-20 – 2014-01-25 (×20): 50 mg via INTRAVENOUS
  Filled 2014-01-20: qty 1
  Filled 2014-01-20 (×13): qty 2
  Filled 2014-01-20 (×2): qty 1
  Filled 2014-01-20 (×5): qty 2
  Filled 2014-01-20: qty 1
  Filled 2014-01-20 (×2): qty 2

## 2014-01-20 MED ORDER — SODIUM CHLORIDE 0.9 % IV BOLUS (SEPSIS)
1000.0000 mL | Freq: Once | INTRAVENOUS | Status: AC
  Administered 2014-01-20: 1000 mL via INTRAVENOUS

## 2014-01-20 MED ORDER — ARFORMOTEROL TARTRATE 15 MCG/2ML IN NEBU
15.0000 ug | INHALATION_SOLUTION | Freq: Two times a day (BID) | RESPIRATORY_TRACT | Status: DC
  Administered 2014-01-20 – 2014-02-01 (×25): 15 ug via RESPIRATORY_TRACT
  Filled 2014-01-20 (×32): qty 2

## 2014-01-20 MED ORDER — BUDESONIDE 0.25 MG/2ML IN SUSP
0.2500 mg | Freq: Two times a day (BID) | RESPIRATORY_TRACT | Status: DC
  Administered 2014-01-20 – 2014-02-01 (×25): 0.25 mg via RESPIRATORY_TRACT
  Filled 2014-01-20 (×25): qty 2

## 2014-01-20 NOTE — Progress Notes (Signed)
This shift pt continued to be febrile, nauseaous and soft BP Attending T. Rogue Bussing notified. 500 ml bolus x2 given this shift. 12.5 mg phenergan given w/ no improvement. New orders given for Reglan 10 mg X 1 dose. Minimal improvement. Boluses presented with minimal improvement. Will continue to monitor and intervene as appropriate.

## 2014-01-20 NOTE — Significant Event (Signed)
Pt lower on arrival to SDU.  Will give 1 liter fluid bolus.  If no improvement, then will need to start levophed >> can use port for this.  Chesley Mires, MD [facility] Pulmonary/Critical Care 01/20/2014, 12:12 PM Pager:  [phone] After 3pm call: [phone]

## 2014-01-20 NOTE — Progress Notes (Signed)
Pt complained the CPAP pressure was too high last night but does not know home settings.  Pt is on automode with min 5 (per protocol) and a max of 18.  Pt instructed to request an RT if the pressure feels too high again but is resting comfortably at this time.

## 2014-01-20 NOTE — Progress Notes (Signed)
Addendum. Very little mprovement with BP after bolus order. 80/45 HR 96. This information reported to shift RN.

## 2014-01-20 NOTE — Progress Notes (Addendum)
Progress Note   Patty [name] NWG:[id number] DOB: 11/01/1940 DOA: 01/18/2014 PCP: Horatio Pel, MD   Brief Narrative:   Patty [name] is an 73 y.o. female with a PMH of high-grade follicular lymphoma diagnosed 11/2013, status post treatment with rituximab and bendamustine 01/10/14 followed by Neulasta support who was admitted on 01/10/14 with chief complaint of fever. Chest x-ray done on admission showed a left lower lobe opacity concerning for healthcare associated pneumonia.  Assessment/Plan:   Principal Problem:   Severe Sepsis secondary to HCAP (healthcare-associated pneumonia) / bacteremia  Continue empiric antibiotics consisting of vancomycin and cefepime.  One set of blood cultures growing GNR.  Hypotensive overnight, received 1.5 L of IVF.  Continues to be mildly hypotensive.  Stress dose steroids started.  Influenza panel negative. Follow-up blood cultures.  2 more sets ordered due to ongoing fevers.  Repeat CXR and check lactic acid.  Tx SDU, PCCM consult for severe sepsis.  Active Problems:   Diarrhea  Likely secondary to chemo, but given elevated WBC, rule out C. Diff.    Atrial fibrillation / long-term (current) use of anticoagulants  Continue digoxin, diltiazem.  Coumadin per pharmacy.    Hyperlipemia  Continues zetia and Pravachol.    Hypothyroidism  Continue Synthroid.    Sleep apnea  Continue CPAP daily at bedtime.    Diabetes mellitus type 2, controlled, without complications  Hold metformin.  Currently on insulin sensitive SSI. CBGs 179-189.    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  Continue allopurinol for prevention of tumor lysis/elevated uric acid.  Continue acyclovir prophylaxis.    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 No current indication for transfusion.    DVT Prophylaxis  On chronic  Coumadin.  Code Status: Full. Family Communication:  Husband Mikki Santee [phone]) and brother updated at the bedside. Disposition Plan: Home when stable.   IV Access:    Port-A-Cath   Procedures and diagnostic studies:   Dg Chest Port 1 View 01/18/2014: 1. Left lower lobe airspace opacity. I do note that the patient had a prior left pleural effusion and some of this could be from layering effusion and atelectasis, but left lower lobe pneumonia is certainly not excluded. 2. Borderline cardiomegaly. 3. Atherosclerotic aortic arch. 4. Thoracic spondylosis.   Medical Consultants:    None.  Anti-Infectives:    Cefepime 01/18/14--->  Vancomycin 01/18/14--->  Flagyl--->  Subjective:   Patty [name] reports ongoing dyspnea and dry cough.  Ongoing loose stools.  She has been nauseated with dry heaves.  Still has a diffuse non-pruritic rash.  Objective:    Filed Vitals:   01/20/14 0615 01/20/14 0803 01/20/14 0838 01/20/14 0943  BP: 84/48 80/45 96/42  84/50  Pulse: 84 96 112 110  Temp: 101.1 F (38.4 C)   102.6 F (39.2 C)  TempSrc: Rectal   Rectal  Resp: 18 26 32 31  Height:      Weight:      SpO2: 94% 95% 92% 94%    Intake/Output Summary (Last 24 hours) at 01/20/14 1009 Last data filed at 01/20/14 0943  Gross per 24 hour  Intake 1377.5 ml  Output      1 ml  Net 1376.5 ml    Exam: Gen:  NAD Skin: Diffuse macular rash Cardiovascular:  Tachycardic, No M/R/G Respiratory:  Lungs with bibasilar crackles Gastrointestinal:  Abdomen soft, NT/ND, + BS Extremities:  No C/E/C  Data Reviewed:    Labs: Basic Metabolic Panel:  Recent Labs Lab 01/18/14 1751 01/19/14 0726  NA 137 136*  K 3.5* 3.4*  CL 99 104  CO2 24 20  GLUCOSE 213* 207*  BUN 14 16  CREATININE 0.75 0.72  CALCIUM 9.3 7.8*   GFR Estimated Creatinine Clearance: 58 mL/min (by C-G formula based on Cr of 0.72). Liver Function Tests:  Recent Labs Lab 01/18/14 1751  AST 21  ALT 29  ALKPHOS  166*  BILITOT 0.7  PROT 6.7  ALBUMIN 3.5   Coagulation profile  Recent Labs Lab 01/18/14 1751 01/19/14 0726 01/20/14 0535  INR 2.20* 2.23* 2.06*    CBC:  Recent Labs Lab 01/18/14 1751 01/19/14 0726 01/20/14 0535  WBC 21.9* 26.4* 37.9*  NEUTROABS 20.1*  --   --   HGB 10.8* 9.2* 10.1*  HCT 33.4* 28.1* 31.2*  MCV 84.3 84.1 85.2  PLT 86* 67* 69*   CBG:  Recent Labs Lab 01/19/14 1652 01/19/14 2201 01/20/14 0845  GLUCAP 189* 179* 173*    Sepsis Labs:  Recent Labs Lab 01/18/14 1751 01/18/14 1758 01/19/14 0726 01/20/14 0535  WBC 21.9*  --  26.4* 37.9*  LATICACIDVEN  --  1.05  --   --    Microbiology Recent Results (from the past 240 hour(s))  Blood Culture (routine x 2)     Status: None (Preliminary result)   Collection Time: 01/18/14  5:51 PM  Result Value Ref Range Status   Specimen Description BLOOD RIGHT ARM  5 ML IN [facility] BOTTLE  Final   Special Requests NONE  Final   Culture  Setup Time   Final    01/18/2014 22:37 Performed at [place] Note: Gram Stain Report Called to,Read Back By and Verified With: VERA WILLIAMS 01/19/14 @ 12:54PM BY RUSCOE A. Performed at [facility]    Report Status PENDING  Incomplete  Urine culture     Status: None (Preliminary result)   Collection Time: 01/18/14  5:55 PM  Result Value Ref Range Status   Specimen Description URINE, CATHETERIZED  Final   Special Requests NONE  Final   Culture  Setup Time   Final    01/18/2014 22:48 Performed at [place] PENDING  Incomplete   Culture   Final    Culture reincubated for better growth Performed at [facility]    Report Status PENDING  Incomplete     Medications:   . acyclovir  400 mg Oral Daily  . albuterol  2.5 mg Nebulization TID  . allopurinol  300 mg Oral Daily  . ceFEPime (MAXIPIME) IV  1 g Intravenous Q8H  . cholecalciferol  2,000 Units Oral Q lunch  .  digoxin  125 mcg Oral Daily  . diltiazem  120 mg Oral Daily  . ezetimibe  10 mg Oral Daily  . insulin aspart  0-5 Units Subcutaneous QHS  . insulin aspart  0-9 Units Subcutaneous TID WC  . levothyroxine  250 mcg Oral QAC breakfast  . magnesium oxide  400 mg Oral QHS  . metronidazole  500 mg Intravenous Q8H  . mometasone-formoterol  2 puff Inhalation BID  . montelukast  10 mg Oral Daily  . omega-3 acid ethyl esters  1 g Oral BID  . potassium chloride SA  20 mEq Oral Daily  . [START ON 01/21/2014] pravastatin  20 mg Oral Once  per day on Mon Tue Wed Thu  . sodium chloride  3 mL Intravenous Q12H  . vancomycin  500 mg Intravenous Q12H  . warfarin  5 mg Oral ONCE-1800  . Warfarin - Pharmacist Dosing Inpatient   Does not apply q1800   Continuous Infusions: . sodium chloride 75 mL/hr at 01/20/14 0653    Time spent: 35 minutes.  The patient is medically complex and requires high complexity decision making.     LOS: 2 days   Patty [name]  [facility] Pager [phone]. If unable to reach me by pager, please call my cell phone at [phone].  *Please refer to [email or web address], password [email or web address] to get updated schedule on who will round on this patient, as hospitalists switch teams weekly. If 7PM-7AM, please contact night-coverage at [email or web address], password [email or web address] for any overnight needs.  01/20/2014, 10:09 AM

## 2014-01-20 NOTE — Progress Notes (Signed)
   01/20/14 1200  Vitals  Temp 98.2 F (36.8 C)  Temp Source Oral  BP (!) 59/39 mmHg  MAP (mmHg) 44  Pulse Rate 93  ECG Heart Rate (!) 101  Cardiac Rhythm Atrial fibrillation  Ectopy Unifocal PVC's  Ectopy Frequency Occasional  Resp (!) 27  Oxygen Therapy  SpO2 97 %   Dr. Rockne Menghini and Dr. Halford Chessman notified to patient arrival to ICU/SD unit and current blood pressure readings. Order written for NS 1 L bolus and Levophed.

## 2014-01-20 NOTE — Procedures (Signed)
Arterial Catheter Insertion Procedure Note Patty [name] [id number] 1940/11/20  Procedure: Insertion of Arterial Catheter  Indications: Blood pressure monitoring and Frequent blood sampling  Procedure Details Consent: Risks of procedure as well as the alternatives and risks of each were explained to the (patient/caregiver).  Consent for procedure obtained. Time Out: Verified patient identification, verified procedure, site/side was marked, verified correct patient position, special equipment/implants available, medications/allergies/relevent history reviewed, required imaging and test results available.  Performed  Maximum sterile technique was used including antiseptics, cap, gloves, gown, hand hygiene, mask and sheet. Skin prep: Chlorhexidine; local anesthetic administered 20 gauge catheter was inserted into right radial artery using the Seldinger technique.  Evaluation Blood flow good; BP tracing good. Complications: No apparent complications.   Patty [name] 01/20/2014

## 2014-01-20 NOTE — Consult Note (Addendum)
PULMONARY / CRITICAL CARE MEDICINE   Name: Patty [name] MRN: [id number] DOB: Nov 14, 1940    ADMISSION DATE:  01/18/2014 CONSULTATION DATE:  01/20/2014  REFERRING MD : Dr. Rockne Menghini  CHIEF COMPLAINT: Confusion  INITIAL PRESENTATION:  73 yo female presented with altered mental status, nausea, fever (105.1) and found to have LLL infiltrate on CXR. She has hx of follicular lymphoma (first chemo on 01/09/14), A fib on coumadin, HTN, HLD, Hypothyroidism, DM.  STUDIES:  11/06 Echo >> EF 55 to 60%  SIGNIFICANT EVENTS: 11/20 Admit 11/22 Transfer to SDU with hypotension  HISTORY OF PRESENT ILLNESS:   73 yo female was recently dx with lymphoma and started on chemotherapy.  She presented with high fever, confusion, and found to have LLL infiltrate.  U/A was also suggestive of UTI.  She was found to have E coli in blood cx.  She developed hypotension.  As a result she was transferred to SDU.  PCCM asked to assist with management.  PAST MEDICAL HISTORY :   has a past medical history of Asthma (06/15/2010); Hypothyroidism (06/15/2010); Hyperlipemia (06/15/2010); Atrial fibrillation (05/28/2010); Long-term (current) use of anticoagulants; ACE-inhibitor cough; DI (detrusor instability); Fibroid; Rectocele; Atrophic vaginitis; Cancer (2008); Diabetes mellitus; Peripheral neuropathy; Hypertension; Dysrhythmia; Complication of anesthesia (1980's); Shortness of breath; Arthritis (06/15/2010); Anemia; Malignant lymphoma, follicular (32/2/02); CPAP (continuous positive airway pressure) dependence; GERD (gastroesophageal reflux disease); Hearing loss; and Sleep apnea.  has past surgical history that includes Appendectomy (5427); Partial hysterectomy (1972); Cholecystectomy (1993); Back surgery; Cataract extraction; Cardiac catheterization (10/10/2008); Abdominal hysterectomy (1972); Foot surgery; Tonsillectomy; Pubovag repair with sling; Skin tag removal (2012); Cardioversion (04/13/2011); Eye surgery; and Mass biopsy  (Left, 12/07/2013). Prior to Admission medications   Medication Sig Start Date End Date Taking? Authorizing Provider  acetaminophen (TYLENOL) 500 MG tablet Take 1,000 mg by mouth every 6 (six) hours as needed for mild pain.    Yes Historical Provider, MD  acyclovir (ZOVIRAX) 400 MG tablet Take 1 tablet (400 mg total) by mouth daily. 01/08/14  Yes Heath Lark, MD  albuterol (PROAIR HFA) 108 (90 BASE) MCG/ACT inhaler Inhale 2 puffs into the lungs every 6 (six) hours as needed for wheezing or shortness of breath.   Yes Historical Provider, MD  allopurinol (ZYLOPRIM) 300 MG tablet Take 1 tablet (300 mg total) by mouth daily. 01/08/14  Yes Heath Lark, MD  Cholecalciferol (VITAMIN D) 2000 UNITS tablet Take 2,000 Units by mouth daily with lunch.   Yes Historical Provider, MD  conjugated estrogens (PREMARIN) vaginal cream Place 1 Applicatorful vaginally 3 (three) times a week. Monday, Wednesday, and Friday nights   Yes Historical Provider, MD  dicyclomine (BENTYL) 10 MG capsule Take 10 mg by mouth daily as needed (ibs flare up).    Yes Historical Provider, MD  digoxin (LANOXIN) 0.125 MG tablet Take 125 mcg by mouth daily.  06/07/11  Yes Peter M Martinique, MD  diltiazem (CARDIZEM CD) 120 MG 24 hr capsule Take 120 mg by mouth daily.  06/07/11  Yes Peter M Martinique, MD  ezetimibe (ZETIA) 10 MG tablet Take 10 mg by mouth daily.    Yes Historical Provider, MD  Fluticasone-Salmeterol (ADVAIR) 250-50 MCG/DOSE AEPB Inhale 1 puff into the lungs every 12 (twelve) hours.    Yes Historical Provider, MD  hydrochlorothiazide (HYDRODIURIL) 25 MG tablet Take 25 mg by mouth daily as needed (for ankle swelling).    Yes Historical Provider, MD  levothyroxine (SYNTHROID, LEVOTHROID) 125 MCG tablet Take 250 mcg by mouth daily before breakfast.  Yes Historical Provider, MD  lidocaine-prilocaine (EMLA) cream Apply 1 application topically as needed. Apply to [facility] a Cath site at least one hour prior to needle stick as needed. 01/04/14  Yes Heath Lark, MD  Magnesium Oxide 500 MG TABS Take 500 mg by mouth at bedtime.   Yes Historical Provider, MD  metFORMIN (GLUCOPHAGE-XR) 500 MG 24 hr tablet Take 500 mg by mouth 2 (two) times daily with a meal.   Yes Historical Provider, MD  montelukast (SINGULAIR) 10 MG tablet Take 10 mg by mouth daily.   Yes Historical Provider, MD  omega-3 acid ethyl esters (LOVAZA) 1 G capsule Take 1 g by mouth 2 (two) times daily.   Yes Historical Provider, MD  ondansetron (ZOFRAN) 8 MG tablet Take 1 tablet (8 mg total) by mouth every 8 (eight) hours as needed. 01/08/14  Yes Heath Lark, MD  potassium chloride SA (K-DUR,KLOR-CON) 20 MEQ tablet Take 1 tablet (20 mEq total) by mouth daily. 12/22/11  Yes Peter M Martinique, MD  pravastatin (PRAVACHOL) 40 MG tablet Take 20 mg by mouth See admin instructions. Take 1/2 tablet (20 mg) on Monday, Tuesday, Wednesday, and Thursday   Yes Historical Provider, MD  prochlorperazine (COMPAZINE) 10 MG tablet Take 1 tablet (10 mg total) by mouth every 6 (six) hours as needed (Nausea or vomiting). 01/08/14  Yes Heath Lark, MD  traMADol-acetaminophen (ULTRACET) 37.5-325 MG per tablet Take 1 tablet by mouth every 6 (six) hours as needed. 12/07/13  Yes Jerrell Belfast, MD  triamterene-hydrochlorothiazide (MAXZIDE-25) 37.5-25 MG per tablet Take 1 tablet by mouth every morning.   Yes Historical Provider, MD  warfarin (COUMADIN) 5 MG tablet Take 2.5-5 mg by mouth daily. Take 1/2 tablet (2.5 mg) on Wednesday and Saturday and take 1 tablet (5 mg) all other days   Yes Peter M Martinique, MD   Allergies  Allergen Reactions  . Morphine And Related Anaphylaxis and Other (See Comments)    Pt states she stopped breathing post op- "went into resp. arrest"  . Demerol Nausea And Vomiting  . Meperidine Hcl Nausea And Vomiting  . Multaq [Dronedarone] Other (See Comments)    Reaction:  Blood in urine and elevated liver enzymes.   Jeanie Cooks Allergy] Rash  . Penicillins Itching and Rash  . Sulfa  Drugs Cross Reactors Rash    FAMILY HISTORY:  indicated that her mother is deceased. She indicated that her father is deceased. She indicated that her sister is alive. She indicated that her brother is deceased.  SOCIAL HISTORY:  reports that she has never smoked. She has never used smokeless tobacco. She reports that she does not drink alcohol or use illicit drugs.  REVIEW OF SYSTEMS:   C/o nausea, fatigue.  Denies chest/abdominal pain.  No rashes or swelling.  SUBJECTIVE:   VITAL SIGNS: Temp:  [98.1 F (36.7 C)-103.1 F (39.5 C)] 102.6 F (39.2 C) (11/22 0943) Pulse Rate:  [84-112] 110 (11/22 0943) Resp:  [18-32] 31 (11/22 0943) BP: (79-109)/(42-66) 84/50 mmHg (11/22 0943) SpO2:  [92 %-100 %] 94 % (11/22 0943) INTAKE / OUTPUT:  Intake/Output Summary (Last 24 hours) at 01/20/14 1023 Last data filed at 01/20/14 0943  Gross per 24 hour  Intake 1377.5 ml  Output      1 ml  Net 1376.5 ml    PHYSICAL EXAMINATION: General:  Ill appearing Neuro:  Normal strength, moves extremities HEENT:  Dry oral mucosa, no oral lesions Cardiovascular:  Regular, tachycardic Lungs:  B/l rhonchi Abdomen:  Soft,  non tender, + bowel sounds Musculoskeletal:  No edema Skin:  No rashes, Rt upper chest port site clean  LABS:  CBC  Recent Labs Lab 01/18/14 1751 01/19/14 0726 01/20/14 0535  WBC 21.9* 26.4* 37.9*  HGB 10.8* 9.2* 10.1*  HCT 33.4* 28.1* 31.2*  PLT 86* 67* 69*   Coag's  Recent Labs Lab 01/18/14 1751 01/19/14 0726 01/20/14 0535  INR 2.20* 2.23* 2.06*   BMET  Recent Labs Lab 01/18/14 1751 01/19/14 0726  NA 137 136*  K 3.5* 3.4*  CL 99 104  CO2 24 20  BUN 14 16  CREATININE 0.75 0.72  GLUCOSE 213* 207*   Electrolytes  Recent Labs Lab 01/18/14 1751 01/19/14 0726  CALCIUM 9.3 7.8*   Sepsis Markers  Recent Labs Lab 01/18/14 1758  LATICACIDVEN 1.05   ABG No results for input(s): PHART, PCO2ART, PO2ART in the last 168 hours.   Liver  Enzymes  Recent Labs Lab 01/18/14 1751  AST 21  ALT 29  ALKPHOS 166*  BILITOT 0.7  ALBUMIN 3.5   Cardiac Enzymes No results for input(s): TROPONINI, PROBNP in the last 168 hours.   Glucose  Recent Labs Lab 01/19/14 1652 01/19/14 2201 01/20/14 0845  GLUCAP 189* 179* 173*    Imaging Dg Chest Port 1 View  01/20/2014   CLINICAL DATA:  Respiratory distress, shortness of breath today, history asthma, atrial fibrillation, diabetes, GERD, lymphoma  EXAM: PORTABLE CHEST - 1 VIEW  COMPARISON:  Portable exam 0957 hr compared to 01/18/2014  FINDINGS: RIGHT jugular power port with tip projecting over SVC near cavoatrial junction.  Mild enlargement of cardiac silhouette with pulmonary vascular congestion.  Atherosclerotic calcification aorta.  Cannot exclude minimal pulmonary edema.  Additional atelectasis versus consolidation in LEFT lower lobe with associated LEFT pleural effusion.  No pneumothorax.  Scattered endplate spur formation thoracic spine.  IMPRESSION: LEFT pleural effusion with atelectasis versus consolidation in LEFT lower lobe.  Enlargement of cardiac silhouette with pulmonary vascular congestion, cannot exclude minimal pulmonary edema.   Electronically Signed   By: Lavonia Dana M.D.   On: 01/20/2014 10:23   Dg Chest Port 1 View  01/18/2014   CLINICAL DATA:  Fever. Lymphoma diagnosis 1 month ago, first chemotherapy treatment 1 week ago. Decreased level of consciousness.  EXAM: PORTABLE CHEST - 1 VIEW  COMPARISON:  Multiple exams, including 12/31/2013 and 11/26/2013  FINDINGS: Power injectable Port-A-Cath noted on the right side, tip projecting over the upper right atrium. Low lung volumes are present, causing crowding of the pulmonary vasculature. Atherosclerotic aortic arch. Thoracic spondylosis. Given the low lung volumes there is borderline cardiomegaly. Greater than expected retrocardiac density compatible with left lower lobe airspace opacity.  IMPRESSION: 1. Left lower lobe  airspace opacity. I do note that the patient had a prior left pleural effusion and some of this could be from layering effusion and atelectasis, but left lower lobe pneumonia is certainly not excluded. 2. Borderline cardiomegaly. 3. Atherosclerotic aortic arch. 4. Thoracic spondylosis.   Electronically Signed   By: Sherryl Barters M.D.   On: 01/18/2014 18:07      ASSESSMENT / PLAN:  PULMONARY A: Acute hypoxic respiratory failure from PNA. Hx of OSA, asthma. P:   Oxygen to keep SpO2 > 92% F/u CXR Brovana, pulmicort, with prn albuterol Continue singulair Hold outpt advair for now CPAP qhs  CARDIOVASCULAR Port A:  Severe sepsis. Hx of permanent A fib on chronic coumadin, HLD, HTN. P:  Continue IV fluids Continue digoxin Hold maxzide, cardizem Continue  zetia, pravastatin Coumadin per pharmacy  RENAL A:   Hypokalemia. P:   F/u and replace electrolytes as needed  GASTROINTESTINAL A:   Hx of GERD. 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 Lymphocytosis >> received neulasta recently as part of chemo regimen. Anemia, thrombocytopenia. P:  F/u CBC Continue allopurinol after chemo for lymphoma  INFECTIOUS A:   Severe sepsis from PNA, UTI, E coli bacteremia. Diarrhea developed 11/21. P:   Day 3 of vancomycin, cefepime >> likely can narrow Abx soon once cx results final Day 1 of flagyl  Blood cx 11/20 >> E coli Urine cx 11/20 >>  C diff PCR 11/21 >>  Blood cx 11/22 >>   ENDOCRINE A:   Hx of DM type II, hypothyroidism. Relative adrenal insufficiency >> received decadron with chemo. P:   SSI Hold metformin for now Continue synthroid Started solu cortef 11/22  NEUROLOGIC A:   Acute encephalopathy 2nd to severe sepsis. P:   Monitor mental status   FAMILY  - Updates: 11/22  - Inter-disciplinary family meet or Palliative Care meeting due by 01/27/14:    TODAY'S SUMMARY:  Severe sepsis with E coli  bacteremia, PNA, UTI, and colitis.  Recently started on chemo for Lymphoma.  Moving to SDU for closer observation.  Discussed goals of care with pt and family >> they are reviewing their options.  Pt would not want long term vent support, but not sure about what to do with short term vent need or cardiac resuscitation in the event of cardiac arrest.   d/w Dr. Rockne Menghini.  CC time 40 minutes.  Chesley Mires, MD [facility] Pulmonary/Critical Care 01/20/2014, 11:25 AM Pager:  [phone] After 3pm call: [phone]

## 2014-01-20 NOTE — Progress Notes (Signed)
ANTICOAGULATION CONSULT NOTE - Follow Up  Pharmacy Consult for Warfarin Indication: atrial fibrillation  Allergies  Allergen Reactions  . Morphine And Related Anaphylaxis and Other (See Comments)    Pt states she stopped breathing post op- "went into resp. arrest"  . Demerol Nausea And Vomiting  . Meperidine Hcl Nausea And Vomiting  . Multaq [Dronedarone] Other (See Comments)    Reaction:  Blood in urine and elevated liver enzymes.   Jeanie Cooks Allergy] Rash  . Penicillins Itching and Rash  . Sulfa Drugs Cross Reactors Rash   Patient Measurements: Height: 5' 6.5" (168.9 cm) Weight: 135 lb 5.8 oz (61.4 kg) IBW/kg (Calculated) : 60.45  Vital Signs: Temp: 101.1 F (38.4 C) (11/22 0615) Temp Source: Rectal (11/22 0615) BP: 96/42 mmHg (11/22 0838) Pulse Rate: 112 (11/22 0838)  Labs:  Recent Labs  01/18/14 1751 01/19/14 0726 01/20/14 0535  HGB 10.8* 9.2* 10.1*  HCT 33.4* 28.1* 31.2*  PLT 86* 67* 69*  LABPROT 24.6* 24.9* 23.4*  INR 2.20* 2.23* 2.06*  CREATININE 0.75 0.72  --    Estimated Creatinine Clearance: 58 mL/min (by C-G formula based on Cr of 0.72).  Medical History: Past Medical History  Diagnosis Date  . Asthma 06/15/2010  . Hypothyroidism 06/15/2010  . Hyperlipemia 06/15/2010  . Atrial fibrillation 05/28/2010    Managed with rate control and coumadin  . Long-term (current) use of anticoagulants   . ACE-inhibitor cough   . DI (detrusor instability)   . Fibroid   . Rectocele   . Atrophic vaginitis   . 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 Diabetes mellitus     Type 2  . Peripheral neuropathy   . Hypertension   . Dysrhythmia     cardioversion - 2012  . Complication of anesthesia 1980's    post anesth.- states she "went into resp. arrest" , but then remarked that she thought maybe they gave her too much medicine (morphine)   . Shortness of breath     sometimes   . 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 Anemia   . 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 CPAP (continuous positive airway pressure) dependence   . GERD (gastroesophageal reflux disease)   . Hearing loss   . Sleep apnea     uses CPAP everynight- last study in [place] 5 yrs. or more    Medications:  Scheduled:  . acyclovir  400 mg Oral Daily  . albuterol  2.5 mg Nebulization TID  . allopurinol  300 mg Oral Daily  . ceFEPime (MAXIPIME) IV  1 g Intravenous Q8H  . cholecalciferol  2,000 Units Oral Q lunch  . digoxin  125 mcg Oral Daily  . diltiazem  120 mg Oral Daily  . ezetimibe  10 mg Oral Daily  . insulin aspart  0-5 Units Subcutaneous QHS  . insulin aspart  0-9 Units Subcutaneous TID WC  . levothyroxine  250 mcg Oral QAC breakfast  . magnesium oxide  400 mg Oral QHS  . mometasone-formoterol  2 puff Inhalation BID  . montelukast  10 mg Oral Daily  . omega-3 acid ethyl esters  1 g Oral BID  . potassium chloride SA  20 mEq Oral Daily  . [START ON 01/21/2014] pravastatin  20 mg Oral Once per day on Mon Tue Wed Thu  . sodium chloride  3 mL Intravenous Q12H  . vancomycin  500 mg Intravenous Q12H  . Warfarin - Pharmacist Dosing Inpatient   Does  not apply q1800  Inpatient warfarin doses administered: 11/20: (already took 5mg  prior to admission, no additional given). 11/21: 2.5mg   Assessment: 56 yoF admitted with sepsis secondary to HCAP following first course of chemotherapy for NHL. On chronic Warfarin for Afib: home dose 2.5mg  on Wed, Sat with 5mg  other days, last dose 11/20 (5mg  - night of admission). Admit INR 2.20, in therapeutic range.    Goal of Therapy:  INR 2-3  Today, 11/21:  INR therapeutic (2.06)  Pltc continues to fall post-chemotherapy, but remains >50K.  Hgb low post-chemotherapy.  No bleeding reported in latest chart note.  On cardiac diet.  1. Warfarin 5mg  PO x 1 today as per patient's usual regimen 2. Follow daily PT/INR 3. Follow CBC - watch pltc post-chemotherapy 4. Follow clinical course and any reports of  bleeding.  Dolly Rias RPh 01/20/2014, 9:59 AM Pager [phone]

## 2014-01-20 NOTE — Progress Notes (Signed)
[facility] Progress Note Patient Name: Patty [name] DOB: [birth year] MRN: [id number]   Date of Service  01/20/2014  HPI/Events of Note  Nurse called b/c of Afib with HR 110-130.  BP stable on levophed at 12 mcg/min.  Afib not a new issue.  Patient with poor UOP despite 3 liter resuscitation today.    eICU Interventions  No intervention for afib Stop IVF at 75 cc/hr Check BMP as not done earlier today     Intervention Category Intermediate Interventions: Arrhythmia - evaluation and management  Mauri Brooklyn, P 01/20/2014, 6:45 PM

## 2014-01-21 ENCOUNTER — Inpatient Hospital Stay (HOSPITAL_COMMUNITY): Payer: Medicare Other

## 2014-01-21 ENCOUNTER — Encounter (HOSPITAL_COMMUNITY): Payer: Self-pay | Admitting: Internal Medicine

## 2014-01-21 DIAGNOSIS — G9340 Encephalopathy, unspecified: Secondary | ICD-10-CM

## 2014-01-21 DIAGNOSIS — I4891 Unspecified atrial fibrillation: Secondary | ICD-10-CM

## 2014-01-21 DIAGNOSIS — C8291 Follicular lymphoma, unspecified, lymph nodes of head, face, and neck: Secondary | ICD-10-CM

## 2014-01-21 DIAGNOSIS — E872 Acidosis: Secondary | ICD-10-CM

## 2014-01-21 DIAGNOSIS — J9601 Acute respiratory failure with hypoxia: Secondary | ICD-10-CM

## 2014-01-21 DIAGNOSIS — R110 Nausea: Secondary | ICD-10-CM

## 2014-01-21 DIAGNOSIS — D649 Anemia, unspecified: Secondary | ICD-10-CM

## 2014-01-21 DIAGNOSIS — D696 Thrombocytopenia, unspecified: Secondary | ICD-10-CM

## 2014-01-21 DIAGNOSIS — A498 Other bacterial infections of unspecified site: Secondary | ICD-10-CM

## 2014-01-21 DIAGNOSIS — N179 Acute kidney failure, unspecified: Secondary | ICD-10-CM

## 2014-01-21 DIAGNOSIS — R140 Abdominal distension (gaseous): Secondary | ICD-10-CM

## 2014-01-21 LAB — PROTIME-INR
INR: 3 — ABNORMAL HIGH (ref 0.00–1.49)
Prothrombin Time: 31.4 seconds — ABNORMAL HIGH (ref 11.6–15.2)

## 2014-01-21 LAB — BLOOD GAS, ARTERIAL
ACID-BASE DEFICIT: 13.4 mmol/L — AB (ref 0.0–2.0)
Acid-base deficit: 12.9 mmol/L — ABNORMAL HIGH (ref 0.0–2.0)
Acid-base deficit: 13.7 mmol/L — ABNORMAL HIGH (ref 0.0–2.0)
BICARBONATE: 12.1 meq/L — AB (ref 20.0–24.0)
BICARBONATE: 13 meq/L — AB (ref 20.0–24.0)
Bicarbonate: 12.3 mEq/L — ABNORMAL LOW (ref 20.0–24.0)
DELIVERY SYSTEMS: POSITIVE
DRAWN BY: 31814
Delivery systems: POSITIVE
Drawn by: 422461
Drawn by: 422461
FIO2: 0.4 %
FIO2: 0.4 %
Mode: POSITIVE
Mode: POSITIVE
Mode: POSITIVE
O2 CONTENT: 6 L/min
O2 Saturation: 89.1 %
O2 Saturation: 93.8 %
O2 Saturation: 93.7 %
PATIENT TEMPERATURE: 97.8
PATIENT TEMPERATURE: 98.4
PCO2 ART: 25.5 mmHg — AB (ref 35.0–45.0)
PCO2 ART: 34.7 mmHg — AB (ref 35.0–45.0)
PEEP: 5 cmH2O
PH ART: 7.295 — AB (ref 7.350–7.450)
PH ART: 7.199 — AB (ref 7.350–7.450)
PO2 ART: 87.8 mmHg (ref 80.0–100.0)
PRESSURE SUPPORT: 8 cmH2O
Patient temperature: 97.8
TCO2: 11.8 mmol/L (ref 0–100)
TCO2: 11.5 mmol/L (ref 0–100)
TCO2: 12.7 mmol/L (ref 0–100)
pCO2 arterial: 28.5 mmHg — ABNORMAL LOW (ref 35.0–45.0)
pH, Arterial: 7.256 — ABNORMAL LOW (ref 7.350–7.450)
pO2, Arterial: 70.3 mmHg — ABNORMAL LOW (ref 80.0–100.0)
pO2, Arterial: 92.8 mmHg (ref 80.0–100.0)

## 2014-01-21 LAB — BASIC METABOLIC PANEL
ANION GAP: 13 (ref 5–15)
Anion gap: 17 — ABNORMAL HIGH (ref 5–15)
BUN: 44 mg/dL — ABNORMAL HIGH (ref 6–23)
BUN: 50 mg/dL — ABNORMAL HIGH (ref 6–23)
CALCIUM: 7.7 mg/dL — AB (ref 8.4–10.5)
CALCIUM: 8.3 mg/dL — AB (ref 8.4–10.5)
CO2: 12 meq/L — AB (ref 19–32)
CO2: 17 mEq/L — ABNORMAL LOW (ref 19–32)
CREATININE: 2.13 mg/dL — AB (ref 0.50–1.10)
Chloride: 105 mEq/L (ref 96–112)
Chloride: 105 mEq/L (ref 96–112)
Creatinine, Ser: 2.28 mg/dL — ABNORMAL HIGH (ref 0.50–1.10)
GFR calc Af Amer: 25 mL/min — ABNORMAL LOW (ref >90)
GFR calc non Af Amer: 22 mL/min — ABNORMAL LOW (ref >90)
GFR calc non Af Amer: 20 mL/min — ABNORMAL LOW (ref >90)
GFR, EST AFRICAN AMERICAN: 23 mL/min — AB (ref >90)
Glucose, Bld: 301 mg/dL — ABNORMAL HIGH (ref 70–99)
Glucose, Bld: 174 mg/dL — ABNORMAL HIGH (ref 70–99)
POTASSIUM: 4.5 meq/L (ref 3.7–5.3)
Potassium: 3.9 mEq/L (ref 3.7–5.3)
SODIUM: 135 meq/L — AB (ref 137–147)
Sodium: 134 mEq/L — ABNORMAL LOW (ref 137–147)

## 2014-01-21 LAB — CULTURE, BLOOD (ROUTINE X 2)

## 2014-01-21 LAB — CBC
HEMATOCRIT: 32.6 % — AB (ref 36.0–46.0)
HEMOGLOBIN: 10.6 g/dL — AB (ref 12.0–15.0)
MCH: 27.5 pg (ref 26.0–34.0)
MCHC: 32.5 g/dL (ref 30.0–36.0)
MCV: 84.5 fL (ref 78.0–100.0)
Platelets: 124 10*3/uL — ABNORMAL LOW (ref 150–400)
RBC: 3.86 MIL/uL — AB (ref 3.87–5.11)
RDW: 15.7 % — ABNORMAL HIGH (ref 11.5–15.5)
WBC: 60 10*3/uL — AB (ref 4.0–10.5)

## 2014-01-21 LAB — MRSA PCR SCREENING: MRSA by PCR: NEGATIVE

## 2014-01-21 LAB — GLUCOSE, CAPILLARY
GLUCOSE-CAPILLARY: 245 mg/dL — AB (ref 70–99)
GLUCOSE-CAPILLARY: 204 mg/dL — AB (ref 70–99)
Glucose-Capillary: 148 mg/dL — ABNORMAL HIGH (ref 70–99)
Glucose-Capillary: 150 mg/dL — ABNORMAL HIGH (ref 70–99)

## 2014-01-21 MED ORDER — HALOPERIDOL LACTATE 5 MG/ML IJ SOLN
3.0000 mg | INTRAMUSCULAR | Status: AC
  Administered 2014-01-21: 3 mg via INTRAVENOUS
  Filled 2014-01-21: qty 1

## 2014-01-21 MED ORDER — SODIUM CHLORIDE 0.9 % IV BOLUS (SEPSIS)
500.0000 mL | Freq: Once | INTRAVENOUS | Status: AC
  Administered 2014-01-21: 500 mL via INTRAVENOUS

## 2014-01-21 MED ORDER — LEVOTHYROXINE SODIUM 125 MCG PO TABS
250.0000 ug | ORAL_TABLET | Freq: Every day | ORAL | Status: DC
  Administered 2014-01-21 – 2014-02-01 (×11): 250 ug via ORAL
  Filled 2014-01-21: qty 2
  Filled 2014-01-21: qty 1
  Filled 2014-01-21: qty 2
  Filled 2014-01-21: qty 1
  Filled 2014-01-21: qty 2
  Filled 2014-01-21 (×4): qty 1
  Filled 2014-01-21 (×2): qty 3
  Filled 2014-01-21: qty 2
  Filled 2014-01-21: qty 3
  Filled 2014-01-21: qty 2
  Filled 2014-01-21: qty 3
  Filled 2014-01-21: qty 1
  Filled 2014-01-21 (×2): qty 3
  Filled 2014-01-21 (×2): qty 1
  Filled 2014-01-21: qty 3
  Filled 2014-01-21: qty 1

## 2014-01-21 MED ORDER — SODIUM BICARBONATE 8.4 % IV SOLN
100.0000 meq | Freq: Once | INTRAVENOUS | Status: AC
  Administered 2014-01-21: 100 meq via INTRAVENOUS
  Filled 2014-01-21: qty 100

## 2014-01-21 MED ORDER — INSULIN GLARGINE 100 UNIT/ML ~~LOC~~ SOLN
10.0000 [IU] | Freq: Every day | SUBCUTANEOUS | Status: DC
  Administered 2014-01-21 – 2014-02-01 (×12): 10 [IU] via SUBCUTANEOUS
  Filled 2014-01-21 (×12): qty 0.1

## 2014-01-21 MED ORDER — VITAMINS A & D EX OINT
TOPICAL_OINTMENT | CUTANEOUS | Status: AC
  Administered 2014-01-21: 01:00:00
  Filled 2014-01-21: qty 5

## 2014-01-21 MED ORDER — MAGIC MOUTHWASH W/LIDOCAINE
5.0000 mL | Freq: Four times a day (QID) | ORAL | Status: DC
  Administered 2014-01-21 – 2014-01-30 (×26): 5 mL via ORAL
  Filled 2014-01-21 (×46): qty 5

## 2014-01-21 MED ORDER — DEXTROSE 5 % IV SOLN: 1.0000 g | INTRAVENOUS | Status: DC

## 2014-01-21 MED ORDER — DEXTROSE 5 % IV SOLN
1.0000 g | INTRAVENOUS | Status: AC
  Administered 2014-01-21 – 2014-02-01 (×12): 1 g via INTRAVENOUS
  Filled 2014-01-21 (×12): qty 10

## 2014-01-21 MED ORDER — INSULIN ASPART 100 UNIT/ML ~~LOC~~ SOLN: 0.0000 [IU] | Freq: Every day | SUBCUTANEOUS | Status: DC

## 2014-01-21 MED ORDER — INSULIN ASPART 100 UNIT/ML ~~LOC~~ SOLN
0.0000 [IU] | Freq: Three times a day (TID) | SUBCUTANEOUS | Status: DC
  Administered 2014-01-21 (×2): 5 [IU] via SUBCUTANEOUS
  Administered 2014-01-21: 2 [IU] via SUBCUTANEOUS

## 2014-01-21 MED ORDER — SODIUM BICARBONATE 8.4 % IV SOLN
INTRAVENOUS | Status: DC
  Administered 2014-01-21 – 2014-01-23 (×4): via INTRAVENOUS
  Filled 2014-01-21 (×8): qty 150

## 2014-01-21 MED ORDER — FENTANYL CITRATE 0.05 MG/ML IJ SOLN
25.0000 ug | INTRAMUSCULAR | Status: DC | PRN
  Administered 2014-01-21 (×2): 50 ug via INTRAVENOUS
  Administered 2014-01-21: 100 ug via INTRAVENOUS
  Administered 2014-01-22 (×5): 50 ug via INTRAVENOUS
  Administered 2014-01-22: 100 ug via INTRAVENOUS
  Administered 2014-01-22: 50 ug via INTRAVENOUS
  Filled 2014-01-21 (×10): qty 2

## 2014-01-21 NOTE — Progress Notes (Addendum)
Progress Note   [name] JIR:[id number] DOB: 1940-12-18 DOA: 01/18/2014 PCP: Horatio Pel, MD   Brief Narrative:   Patty [name] is an 73 y.o. female with a PMH of high-grade follicular lymphoma diagnosed 11/2013, status post treatment with rituximab and bendamustine 01/10/14 followed by Neulasta support who was admitted on 01/10/14 with chief complaint of fever. Chest x-ray done on admission showed a left lower lobe opacity concerning for healthcare associated pneumonia.  Assessment/Plan:   Principal Problem:   Severe Sepsis/septic shock secondary to Escherichia coli bacteremia/UTI with possible left lower lobe HCAP (healthcare-associated pneumonia)  Continue empiric antibiotics consisting of vancomycin and cefepime.  One set of blood cultures from 01/18/14 growing Escherichia coli. Urine culture positive for Escherichia coli. Follow-up repeat blood cultures done 01/20/14.  Stress dose steroids started and fluid resuscitation needed 01/20/14 secondary to persistent hypotension.  Also on Levophed.  Influenza panel negative.   Repeat CXR showed a possible left lower lobe pneumonia.  Critical care consulted 01/20/14 secondary to severe sepsis.  WBC remains markedly elevated and climbing despite broad-spectrum antibiotics  (treated with Neulasta 01/11/14).  Active Problems:   Mucositis  MMW ordered.    Acute renal failure  Likely secondary to sepsis/hypotension.  Continue to hydrate and monitor creatinine for recovery of renal function.    Metabolic acidosis  Secondary to lactic acidosis/sepsis.    Hypokalemia  Resolved with supplementation.    Acute encephalopathy  Secondary to severe sepsis.    Diarrhea  Likely secondary to chemo, but given elevated WBC, placed on empiric Flagyl until C. difficile ruled out.  Consider AAS if persistent.    Atrial fibrillation / long-term (current) use of anticoagulants  Continue digoxin,  diltiazem.  Coumadin per pharmacy.    Hyperlipemia  Continues zetia and Pravachol.    Hypothyroidism  Continue Synthroid.    Sleep apnea  Continue CPAP daily at bedtime.    Diabetes mellitus type 2, controlled, without complications  Hold metformin.  Currently on insulin sensitive SSI. CBGs 173-264.  Change SSI to moderate scale and add 10 units of Lantus daily.    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  Continue allopurinol for prevention of tumor lysis/elevated uric acid.  Continue acyclovir prophylaxis.  Dr. Alvy Bimler notified of the patient's admission.    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 No current indication for transfusion.    DVT Prophylaxis  On chronic Coumadin.  Code Status: Full. Family Communication:  Husband Patty [name] [phone]) and brother updated at the bedside. Disposition Plan: Home when stable.   IV Access:    Port-A-Cath   Procedures and diagnostic studies:   Dg Chest Port 1 View 01/18/2014: 1. Left lower lobe airspace opacity. I do note that the patient had a prior left pleural effusion and some of this could be from layering effusion and atelectasis, but left lower lobe pneumonia is certainly not excluded. 2. Borderline cardiomegaly. 3. Atherosclerotic aortic arch. 4. Thoracic spondylosis.   Dg Chest Port 1 View 01/20/2014: LEFT pleural effusion with atelectasis versus consolidation in LEFT lower lobe.  Enlargement of cardiac silhouette with pulmonary vascular congestion, cannot exclude minimal pulmonary edema.     Dg Chest Port 1 View 01/21/2014: Layering pleural effusions and basilar atelectasis. There may be superimposed pneumonia, especially at the left base.      Medical Consultants:    None.  Anti-Infectives:    Cefepime 01/18/14--->01/21/14  Rocephin  01/21/14--->  Vancomycin 01/18/14--->  Flagyl 01/20/14--->  01/21/14  Subjective:   Patty [name] is still quite dyspneic.  No further diarrhea.  Complains of mouth sores.  Poor appetite.  No N/V.  Objective:    Filed Vitals:   01/21/14 1159 01/21/14 1226 01/21/14 1307 01/21/14 1311  BP:      Pulse: 26 104 99 110  Temp:  97.4 F (36.3 C)    TempSrc:  Axillary    Resp: 24 30 25 28   Height:      Weight:      SpO2: 90% 100% 100% 96%    Intake/Output Summary (Last 24 hours) at 01/21/14 1417 Last data filed at 01/21/14 1300  Gross per 24 hour  Intake 3049.66 ml  Output    405 ml  Net 2644.66 ml    Exam: Gen:  NAD Skin: Diffuse macular rash Cardiovascular:  Tachycardic, No M/R/G Respiratory:  Lungs with bibasilar crackles Gastrointestinal:  Abdomen soft, NT/ND, + BS Extremities:  No C/E/C   Data Reviewed:    Labs: Basic Metabolic Panel:  Recent Labs Lab 01/18/14 1751 01/19/14 0726 01/20/14 1849 01/21/14 0407  NA 137 136* 135* 134*  K 3.5* 3.4* 4.2 3.9  CL 99 104 105 105  CO2 24 20 14* 12*  GLUCOSE 213* 207* 269* 301*  BUN 14 16 38* 44*  CREATININE 0.75 0.72 2.06* 2.13*  CALCIUM 9.3 7.8* 7.5* 7.7*   GFR Estimated Creatinine Clearance: 21.4 mL/min (by C-G formula based on Cr of 2.13). Liver Function Tests:  Recent Labs Lab 01/18/14 1751  AST 21  ALT 29  ALKPHOS 166*  BILITOT 0.7  PROT 6.7  ALBUMIN 3.5   Coagulation profile  Recent Labs Lab 01/18/14 1751 01/19/14 0726 01/20/14 0535 01/21/14 0407  INR 2.20* 2.23* 2.06* 3.00*    CBC:  Recent Labs Lab 01/18/14 1751 01/19/14 0726 01/20/14 0535 01/21/14 0520  WBC 21.9* 26.4* 37.9* 60.0*  NEUTROABS 20.1*  --   --   --   HGB 10.8* 9.2* 10.1* 10.6*  HCT 33.4* 28.1* 31.2* 32.6*  MCV 84.3 84.1 85.2 84.5  PLT 86* 67* 69* 124*   CBG:  Recent Labs Lab 01/20/14 1151 01/20/14 1624 01/20/14 2214 01/21/14 0751 01/21/14 1321  GLUCAP 193* 234* 264* 245* 204*    Sepsis Labs:  Recent Labs Lab 01/18/14 1751 01/18/14 1758 01/19/14 0726  01/20/14 0535 01/20/14 1035 01/21/14 0520  WBC 21.9*  --  26.4* 37.9*  --  60.0*  LATICACIDVEN  --  1.05  --   --  3.4*  --    Microbiology Recent Results (from the past 240 hour(s))  Blood Culture (routine x 2)     Status: None   Collection Time: 01/18/14  5:51 PM  Result Value Ref Range Status   Specimen Description BLOOD RIGHT ARM  5 ML IN [facility] BOTTLE  Final   Special Requests NONE  Final   Culture  Setup Time   Final    01/18/2014 22:37 Performed at [facility]    Culture   Final    ESCHERICHIA COLI Note: Gram Stain Report Called to,Read Back By and Verified With: Maretta Bees 01/19/14 @ 12:54PM BY RUSCOE A. Performed at [facility]    Report Status 01/21/2014 FINAL  Final   Organism ID, Bacteria ESCHERICHIA COLI  Final      Susceptibility   Escherichia coli - MIC*    AMPICILLIN <=2 SENSITIVE Sensitive     AMPICILLIN/SULBACTAM <=2 SENSITIVE Sensitive  CEFAZOLIN <=4 SENSITIVE Sensitive     CEFEPIME <=1 SENSITIVE Sensitive     CEFTAZIDIME <=1 SENSITIVE Sensitive     CEFTRIAXONE <=1 SENSITIVE Sensitive     CIPROFLOXACIN <=0.25 SENSITIVE Sensitive     GENTAMICIN >=16 RESISTANT Resistant     IMIPENEM <=0.25 SENSITIVE Sensitive     PIP/TAZO <=4 SENSITIVE Sensitive     TOBRAMYCIN 8 INTERMEDIATE Intermediate     TRIMETH/SULFA <=20 SENSITIVE Sensitive     * ESCHERICHIA COLI  Urine culture     Status: None (Preliminary result)   Collection Time: 01/18/14  5:55 PM  Result Value Ref Range Status   Specimen Description URINE, CATHETERIZED  Final   Special Requests NONE  Final   Culture  Setup Time   Final    01/18/2014 22:48 Performed at [place] [place]   Final    >=100,000 COLONIES/ML Performed at [facility]    Culture   Final    [place] Performed at [facility]    Report Status PENDING  Incomplete  Blood Culture (routine x 2)     Status: None   Collection Time: 01/18/14  6:04  PM  Result Value Ref Range Status   Specimen Description BLOOD  Final   Special Requests BOTTLES DRAWN AEROBIC ONLY 3 CC  Final   Culture  Setup Time   Final    01/18/2014 22:38 Performed at [facility]    Culture   Final    ESCHERICHIA COLI Note: SUSCEPTIBILITIES PERFORMED ON PREVIOUS CULTURE WITHIN THE LAST 5 DAYS. Note: Gram Stain Report Called to,Read Back By and Verified With: VERA WILLIAMS 01/19/14 @ 12:54PM BY RUSCOE A. Performed at [facility]    Report Status 01/21/2014 FINAL  Final  Clostridium Difficile by PCR     Status: None   Collection Time: 01/20/14  6:17 AM  Result Value Ref Range Status   C difficile by pcr NEGATIVE NEGATIVE Final    Comment: Performed at [facility]  Culture, blood (routine x 2)     Status: None (Preliminary result)   Collection Time: 01/20/14 10:35 AM  Result Value Ref Range Status   Specimen Description BLOOD RIGHT ARM  Final   Special Requests   Final    BOTTLES DRAWN AEROBIC AND ANAEROBIC 10 CC BOTH BOTTLES   Culture  Setup Time   Final    01/20/2014 15:08 Performed at [facility]    Culture   Final           BLOOD CULTURE RECEIVED NO GROWTH TO DATE CULTURE WILL BE HELD FOR 5 DAYS BEFORE ISSUING A FINAL NEGATIVE REPORT Performed at [facility]    Report Status PENDING  Incomplete  Culture, blood (routine x 2)     Status: None (Preliminary result)   Collection Time: 01/20/14 11:00 AM  Result Value Ref Range Status   Specimen Description BLOOD PAC  10 ML IN [facility] BOTTLE  Final   Special Requests Immunocompromised  Final   Culture  Setup Time   Final    01/20/2014 18:26 Performed at [facility]    Culture   Final           BLOOD CULTURE RECEIVED NO GROWTH TO DATE CULTURE WILL BE HELD FOR 5 DAYS BEFORE ISSUING A FINAL NEGATIVE REPORT Performed at [facility]    Report Status PENDING  Incomplete     Medications:   .  acyclovir  400 mg Oral Daily  . allopurinol  300 mg  Oral Daily  . arformoterol  15 mcg Nebulization BID  . budesonide (PULMICORT) nebulizer solution  0.25 mg Nebulization BID  . cefTRIAXone (ROCEPHIN)  IV  1 g Intravenous Q24H  . cholecalciferol  2,000 Units Oral Q lunch  . digoxin  125 mcg Oral Daily  . ezetimibe  10 mg Oral Daily  . hydrocortisone sod succinate (SOLU-CORTEF) inj  50 mg Intravenous Q6H  . insulin aspart  0-15 Units Subcutaneous TID WC  . insulin aspart  0-5 Units Subcutaneous QHS  . insulin glargine  10 Units Subcutaneous Daily  . levothyroxine  250 mcg Oral QAC breakfast  . magic mouthwash w/lidocaine  5 mL Oral QID  . magnesium oxide  400 mg Oral QHS  . montelukast  10 mg Oral Daily  . omega-3 acid ethyl esters  1 g Oral BID  . potassium chloride SA  20 mEq Oral Daily  . pravastatin  20 mg Oral Once per day on Mon Tue Wed Thu  . sodium chloride  3 mL Intravenous Q12H  . Warfarin - Pharmacist Dosing Inpatient   Does not apply q1800   Continuous Infusions: . norepinephrine (LEVOPHED) Adult infusion 8 mcg/min (01/21/14 1334)    Time spent: 35 minutes.  The patient is medically complex and requires high complexity decision making.     LOS: 3 days   [facility] Pager [phone]. If unable to reach me by pager, please call my cell phone at [phone].  *Please refer to [email or web address], password [email or web address] to get updated schedule on who will round on this patient, as hospitalists switch teams weekly. If 7PM-7AM, please contact night-coverage at [email or web address], password [email or web address] for any overnight needs.  01/21/2014, 2:17 PM     Information printed out and reviewed with the patient/family:     In an effort to keep you and your family informed about your hospital stay, I am providing you with this information sheet. If you or your family have any questions, please do not hesitate to have the nursing staff page me to set up a meeting time.  Also note that the hospitalist doctors typically change on Tuesdays or  Wednesdays to a different hospitalist doctor.  Patty [name] 01/21/2014 3 (Number of days in the hospital)  Treatment team:  Dr. Jacquelynn Cree, Hospitalist (Internist)  Dr. Chesley Mires, Critical Care Specialist  Dr. Alvy Bimler. Oncologist  Pertinent labs / studies:  Chest x-ray done 01/20/14 shows a possible left lower lobe pneumonia.  One set of blood cultures and urine cultures growing a bacteria called Escherichia coli.  Stool studies were negative for C. difficile.   Principle Diagnosis: Sepsis (blood poisoning) and UTI caused by Escherichia coli   Plan for today: 1. Continue antibiotics. 2. Adjust insulin for better sugar control. 3. Continue to monitor in the ICU.  Anticipated discharge date: Depends on progress.

## 2014-01-21 NOTE — Progress Notes (Signed)
Acute delirium after daughter came to bedside and woke up patient from sleep   Recent Labs Lab 01/18/14 1751 01/19/14 0726 01/20/14 1849 01/21/14 0407 01/21/14 1600  NA 137 136* 135* 134* 135*  K 3.5* 3.4* 4.2 3.9 4.5  CL 99 104 105 105 105  CO2 24 20 14* 12* 17*  GLUCOSE 213* 207* 269* 301* 174*  BUN 14 16 38* 44* 50*  CREATININE 0.75 0.72 2.06* 2.13* 2.28*  CALCIUM 9.3 7.8* 7.5* 7.7* 8.3*     Recent Labs Lab 01/19/14 0726 01/20/14 0535 01/21/14 0520  HGB 9.2* 10.1* 10.6*  HCT 28.1* 31.2* 32.6*  WBC 26.4* 37.9* 60.0*  PLT 67* 69* 124*    Recent Labs Lab 01/18/14 1758 01/20/14 1035  LATICACIDVEN 1.05 3.4*   PLAN Haldol 3mg  IV stat x 1 (QTC < 569msec, 01/20/14 )   Dr. Brand Males, M.D., F.C.C.P Pulmonary and Critical Care Medicine Staff Physician [facility] Pager: [phone], If no answer or between  15:00h - 7:00h: call [phone]  01/21/2014 8:51 PM

## 2014-01-21 NOTE — Progress Notes (Signed)
ANTICOAGULATION CONSULT NOTE - Follow Up  Pharmacy Consult for Warfarin Indication: atrial fibrillation  Allergies  Allergen Reactions  . Morphine And Related Anaphylaxis and Other (See Comments)    Pt states she stopped breathing post op- "went into resp. arrest"  . Demerol Nausea And Vomiting  . Meperidine Hcl Nausea And Vomiting  . Multaq [Dronedarone] Other (See Comments)    Reaction:  Blood in urine and elevated liver enzymes.   Jeanie Cooks Allergy] Rash  . Penicillins Itching and Rash  . Sulfa Drugs Cross Reactors Rash   Patient Measurements: Height: 5\' 6"  (167.6 cm) Weight: 155 lb 6.8 oz (70.5 kg) IBW/kg (Calculated) : 59.3  Vital Signs: Temp: 97.4 F (36.3 C) (11/23 0400) Temp Source: Oral (11/23 0000) BP: 119/51 mmHg (11/22 2134) Pulse Rate: 99 (11/23 0630)  Labs:  Recent Labs  01/19/14 0726 01/20/14 0535 01/20/14 1849 01/21/14 0407 01/21/14 0520  HGB 9.2* 10.1*  --   --  10.6*  HCT 28.1* 31.2*  --   --  32.6*  PLT 67* 69*  --   --  124*  LABPROT 24.9* 23.4*  --  31.4*  --   INR 2.23* 2.06*  --  3.00*  --   CREATININE 0.72  --  2.06* 2.13*  --    Estimated Creatinine Clearance: 21.4 mL/min (by C-G formula based on Cr of 2.13).  Medical History: Past Medical History  Diagnosis Date  . Asthma 06/15/2010  . Hypothyroidism 06/15/2010  . Hyperlipemia 06/15/2010  . Atrial fibrillation 05/28/2010    Managed with rate control and coumadin  . Long-term (current) use of anticoagulants   . ACE-inhibitor cough   . DI (detrusor instability)   . Fibroid   . Rectocele   . Atrophic vaginitis   . 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 Diabetes mellitus     Type 2  . Peripheral neuropathy   . Hypertension   . Dysrhythmia     cardioversion - 2012  . Complication of anesthesia 1980's    post anesth.- states she "went into resp. arrest" , but then remarked that she thought maybe they gave her too much medicine (morphine)   . Shortness of breath     sometimes   . 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 Anemia   . Malignant lymphoma, follicular 33/0/07  . CPAP (continuous positive airway pressure) dependence   . GERD (gastroesophageal reflux disease)   . Hearing loss   . Sleep apnea     uses CPAP everynight- last study in [place] 5 yrs. or more    Medications:  Scheduled:  . acyclovir  400 mg Oral Daily  . allopurinol  300 mg Oral Daily  . arformoterol  15 mcg Nebulization BID  . budesonide (PULMICORT) nebulizer solution  0.25 mg Nebulization BID  . ceFEPime (MAXIPIME) IV  1 g Intravenous Q8H  . cholecalciferol  2,000 Units Oral Q lunch  . digoxin  125 mcg Oral Daily  . ezetimibe  10 mg Oral Daily  . hydrocortisone sod succinate (SOLU-CORTEF) inj  50 mg Intravenous Q6H  . insulin aspart  0-15 Units Subcutaneous TID WC  . insulin aspart  0-5 Units Subcutaneous QHS  . insulin glargine  10 Units Subcutaneous Daily  . levothyroxine  250 mcg Oral QAC breakfast  . magnesium oxide  400 mg Oral QHS  . montelukast  10 mg Oral Daily  . omega-3 acid ethyl esters  1 g Oral BID  .  potassium chloride SA  20 mEq Oral Daily  . pravastatin  20 mg Oral Once per day on Mon Tue Wed Thu  . sodium chloride  3 mL Intravenous Q12H  . vancomycin  500 mg Intravenous Q12H  . Warfarin - Pharmacist Dosing Inpatient   Does not apply q1800  Inpatient warfarin doses administered from 11/20 5mg  prior to admission, 2.5mg , 5mg   Assessment: 49 yoF admitted with sepsis secondary to HCAP following first course of chemotherapy for NHL. On chronic Warfarin for Afib: home dose 2.5mg  on Wed, Sat with 5mg  other days, last dose 11/20 (5mg  - night of admission). Admit INR 2.20, in therapeutic range.    Goal of Therapy:  INR 2-3  Today, 11/23:  INR 3.0 (from 2.06 yesterday)  Pltc improved to 124K post-chemotherapy,   Hgb low post-chemotherapy.  No bleeding reported in latest chart note.  On cardiac diet-poor po intake  Received Metronidazole x 3 doses  11/22-11/23 (too early to see effect on INR)  1. No Warfarin today 2. Follow daily PT/INR 3. Follow CBC - following pltc post-chemotherapy 4. Follow clinical course and any reports of bleeding.  Minda Ditto PharmD Pager [phone] 01/21/2014, 8:21 AM

## 2014-01-21 NOTE — Progress Notes (Addendum)
[place] for Vancomycin & Cefepime Indication: rule out PNA, sepsis  Allergies  Allergen Reactions  . Morphine And Related Anaphylaxis and Other (See Comments)    Pt states she stopped breathing post op- "went into resp. arrest"  . Demerol Nausea And Vomiting  . Meperidine Hcl Nausea And Vomiting  . Multaq [Dronedarone] Other (See Comments)    Reaction:  Blood in urine and elevated liver enzymes.   Jeanie Cooks Allergy] Rash  . Penicillins Itching and Rash  . Sulfa Drugs Cross Reactors Rash   Patient Measurements: Height: 5\' 6"  (167.6 cm) Weight: 155 lb 6.8 oz (70.5 kg) IBW/kg (Calculated) : 59.3 Total body weight: 63kg  Vital Signs: Temp: 97.7 F (36.5 C) (11/23 0800) Temp Source: Oral (11/23 0800) BP: 119/51 mmHg (11/22 2134) Pulse Rate: 102 (11/23 0845) Intake/Output from previous day: 11/22 0701 - 11/23 0700 In: 3900.6 [P.O.:120; I.V.:2380.6; IV Piggyback:1400] Out: 311 [Urine:310; Stool:1] Intake/Output from this shift: Total I/O In: 690.8 [I.V.:540.8; IV Piggyback:150] Out: -   Labs:  Recent Labs  01/19/14 0726 01/20/14 0535 01/20/14 1849 01/21/14 0407 01/21/14 0520  WBC 26.4* 37.9*  --   --  60.0*  HGB 9.2* 10.1*  --   --  10.6*  PLT 67* 69*  --   --  124*  CREATININE 0.72  --  2.06* 2.13*  --    Estimated Creatinine Clearance: 21.4 mL/min (by C-G formula based on Cr of 2.13). No results for input(s): VANCOTROUGH, VANCOPEAK, VANCORANDOM, GENTTROUGH, GENTPEAK, GENTRANDOM, TOBRATROUGH, TOBRAPEAK, TOBRARND, AMIKACINPEAK, AMIKACINTROU, AMIKACIN in the last 72 hours.   Microbiology: Recent Results (from the past 720 hour(s))  Blood Culture (routine x 2)     Status: None   Collection Time: 01/18/14  5:51 PM  Result Value Ref Range Status   Specimen Description BLOOD RIGHT ARM  5 ML IN [facility] BOTTLE  Final   Special Requests NONE  Final   Culture  Setup Time   Final    01/18/2014 22:37 Performed at [facility]    Culture   Final    ESCHERICHIA COLI Note: Gram Stain Report Called to,Read Back By and Verified With: VERA WILLIAMS 01/19/14 @ 12:54PM BY RUSCOE A. Performed at [facility]    Report Status 01/21/2014 FINAL  Final   Organism ID, Bacteria ESCHERICHIA COLI  Final      Susceptibility   Escherichia coli - MIC*    AMPICILLIN <=2 SENSITIVE Sensitive     AMPICILLIN/SULBACTAM <=2 SENSITIVE Sensitive     CEFAZOLIN <=4 SENSITIVE Sensitive     CEFEPIME <=1 SENSITIVE Sensitive     CEFTAZIDIME <=1 SENSITIVE Sensitive     CEFTRIAXONE <=1 SENSITIVE Sensitive     CIPROFLOXACIN <=0.25 SENSITIVE Sensitive     GENTAMICIN >=16 RESISTANT Resistant     IMIPENEM <=0.25 SENSITIVE Sensitive     PIP/TAZO <=4 SENSITIVE Sensitive     TOBRAMYCIN 8 INTERMEDIATE Intermediate     TRIMETH/SULFA <=20 SENSITIVE Sensitive     * ESCHERICHIA COLI  Urine culture     Status: None (Preliminary result)   Collection Time: 01/18/14  5:55 PM  Result Value Ref Range Status   Specimen Description URINE, CATHETERIZED  Final   Special Requests NONE  Final   Culture  Setup Time   Final    01/18/2014 22:48 Performed at [place]   Final    >=100,000 COLONIES/ML Performed at [facility]  Culture   Final    ESCHERICHIA COLI GRAM NEGATIVE RODS Performed at [facility]    Report Status PENDING  Incomplete  Blood Culture (routine x 2)     Status: None   Collection Time: 01/18/14  6:04 PM  Result Value Ref Range Status   Specimen Description BLOOD  Final   Special Requests BOTTLES DRAWN AEROBIC ONLY 3 CC  Final   Culture  Setup Time   Final    01/18/2014 22:38 Performed at [facility]    Culture   Final    ESCHERICHIA COLI Note: SUSCEPTIBILITIES PERFORMED ON PREVIOUS CULTURE WITHIN THE LAST 5 DAYS. Note: Gram Stain Report Called to,Read Back By and Verified With: VERA WILLIAMS 01/19/14 @ 12:54PM BY RUSCOE A. Performed at [facility]    Report Status 01/21/2014 FINAL  Final  Clostridium Difficile by PCR     Status: None   Collection Time: 01/20/14  6:17 AM  Result Value Ref Range Status   C difficile by pcr NEGATIVE NEGATIVE Final    Comment: Performed at [facility]   Medications:  Scheduled:  . acyclovir  400 mg Oral Daily  . allopurinol  300 mg Oral Daily  . arformoterol  15 mcg Nebulization BID  . budesonide (PULMICORT) nebulizer solution  0.25 mg Nebulization BID  . [START ON 01/22/2014] ceFEPime (MAXIPIME) IV  1 g Intravenous Q24H  . cholecalciferol  2,000 Units Oral Q lunch  . digoxin  125 mcg Oral Daily  . ezetimibe  10 mg Oral Daily  . hydrocortisone sod succinate (SOLU-CORTEF) inj  50 mg Intravenous Q6H  . insulin aspart  0-15 Units Subcutaneous TID WC  . insulin aspart  0-5 Units Subcutaneous QHS  . insulin glargine  10 Units Subcutaneous Daily  . levothyroxine  250 mcg Oral QAC breakfast  . magnesium oxide  400 mg Oral QHS  . montelukast  10 mg Oral Daily  . omega-3 acid ethyl esters  1 g Oral BID  . potassium chloride SA  20 mEq Oral Daily  . pravastatin  20 mg Oral Once per day on Mon Tue Wed Thu  . sodium chloride  3 mL Intravenous Q12H  . vancomycin  500 mg Intravenous Q12H  . Warfarin - Pharmacist Dosing Inpatient   Does not apply q1800   Anti-infectives    Start     Dose/Rate Route Frequency Ordered Stop   01/22/14 0800  ceFEPIme (MAXIPIME) 1 g in dextrose 5 % 50 mL IVPB     1 g100 mL/hr over 30 Minutes Intravenous Every 24 hours 01/21/14 0846     01/20/14 1100  metroNIDAZOLE (FLAGYL) IVPB 500 mg  Status:  Discontinued    Comments:  Suspected C diff, start while awaiting diagnostic testing   500 mg100 mL/hr over 60 Minutes Intravenous Every 8 hours 01/20/14 1004 01/21/14 0744   01/19/14 1000  acyclovir (ZOVIRAX) tablet 400 mg     400 mg Oral Daily 01/18/14 2133     01/19/14 0800  vancomycin (VANCOCIN) 500 mg in sodium chloride 0.9 % 100 mL IVPB     500 mg100 mL/hr over  60 Minutes Intravenous Every 12 hours 01/18/14 1956     01/19/14 0200  ceFEPIme (MAXIPIME) 1 g in dextrose 5 % 50 mL IVPB  Status:  Discontinued     1 g100 mL/hr over 30 Minutes Intravenous Every 8 hours 01/18/14 1958 01/21/14 0846   01/18/14 1930  vancomycin (VANCOCIN) 1,250 mg in sodium chloride  0.9 % 250 mL IVPB     1,250 mg166.7 mL/hr over 90 Minutes Intravenous  Once 01/18/14 1823 01/18/14 2041   01/18/14 1830  ceFEPIme (MAXIPIME) 1 g in dextrose 5 % 50 mL IVPB     1 g100 mL/hr over 30 Minutes Intravenous  Once 01/18/14 1822 01/18/14 1911     Assessment: 42 yoF with fever and decreased LOC. Recent chemo for newly diagnosed Lymphoma; Cycle 1 Rituxan d1, Bendamustine d1,2 q 28 days on 11/11 & 11/12. Vancomycin & Cefepime per pharmacy  Febrile on admit 105.1, with WBC 21.9K, CXray possible PNA  Remains febrile, Tmax 103.1, WBC 60K  Day 4 Vancomycin 500mg  q12 & Cefepime 1gm q8   Blood x 2, urine growing E coli; pan sensitive including Cefepime, exc intermediate to Tobramycin  Renal function worsening, Cl < 30 ml/min  Goal of Therapy:  Vancomycin trough level 15-20 mcg/ml  Plan:   Vancomycin 1250mg  x1 in ED, then 500mg  q12  Reduce Cefepime 1gm to q24hr  Vancomycin trough ordered for tonight with worsening renal function  Thank you for the consult  Minda Ditto PharmD Pager [phone] 01/21/2014, 8:58 AM

## 2014-01-21 NOTE — Progress Notes (Signed)
PULMONARY / CRITICAL CARE MEDICINE   Name: Patty [name] MRN: [id number] DOB: January 08, 1941    ADMISSION DATE:  01/18/2014 CONSULTATION DATE:  01/20/2014  REFERRING MD : Dr. Rockne Menghini  CHIEF COMPLAINT: Confusion  INITIAL PRESENTATION:  73 yo female presented with altered mental status, nausea, fever (105.1), ? LLL infiltrate on CXR. Dx w E coli bacteremia. She has hx of follicular lymphoma (first chemo on 01/09/14), A fib on coumadin, HTN, HLD, Hypothyroidism, DM.  STUDIES:  11/06 Echo >> EF 55 to 60%  SIGNIFICANT EVENTS: 11/20 Admit 11/22 Transfer to SDU with hypotension 11/23 Started on BiPAP for increased WOB in setting progressive metabolic acidosis.   SUBJECTIVE:  Wore biPAP from  Oliguric at ~ 10+ cc/h last 24 h   VITAL SIGNS: Temp:  [97.4 F (36.3 C)-102.6 F (39.2 C)] 97.7 F (36.5 C) (11/23 0800) Pulse Rate:  [32-144] 102 (11/23 0845) Resp:  [15-38] 26 (11/23 0845) BP: (51-119)/(25-54) 119/51 mmHg (11/22 2134) SpO2:  [27 %-100 %] 100 % (11/23 0845) FiO2 (%):  [40 %] 40 % (11/23 0400) Weight:  [66.4 kg (146 lb 6.2 oz)-70.5 kg (155 lb 6.8 oz)] 70.5 kg (155 lb 6.8 oz) (11/23 0500) INTAKE / OUTPUT:  Intake/Output Summary (Last 24 hours) at 01/21/14 0923 Last data filed at 01/21/14 0838  Gross per 24 hour  Intake 4591.42 ml  Output    311 ml  Net 4280.42 ml    PHYSICAL EXAMINATION: General:  Ill appearing Neuro:  Normal strength, moves extremities HEENT:  Dry oral mucosa, no oral lesions Cardiovascular:  Regular, tachycardic Lungs:  B/l rhonchi Abdomen:  Soft, non tender, + bowel sounds Musculoskeletal:  No edema Skin:  No rashes, Rt upper chest port site clean  LABS:  CBC  Recent Labs Lab 01/19/14 0726 01/20/14 0535 01/21/14 0520  WBC 26.4* 37.9* 60.0*  HGB 9.2* 10.1* 10.6*  HCT 28.1* 31.2* 32.6*  PLT 67* 69* 124*   Coag's  Recent Labs Lab 01/19/14 0726 01/20/14 0535 01/21/14 0407  INR 2.23* 2.06* 3.00*   BMET  Recent Labs Lab  01/19/14 0726 01/20/14 1849 01/21/14 0407  NA 136* 135* 134*  K 3.4* 4.2 3.9  CL 104 105 105  CO2 20 14* 12*  BUN 16 38* 44*  CREATININE 0.72 2.06* 2.13*  GLUCOSE 207* 269* 301*   Electrolytes  Recent Labs Lab 01/19/14 0726 01/20/14 1849 01/21/14 0407  CALCIUM 7.8* 7.5* 7.7*   Sepsis Markers  Recent Labs Lab 01/18/14 1758 01/20/14 1035  LATICACIDVEN 1.05 3.4*   ABG  Recent Labs Lab 01/21/14 0300 01/21/14 0510  PHART 7.256* 7.295*  PCO2ART 28.5* 25.5*  PO2ART 70.3* 87.8     Liver Enzymes  Recent Labs Lab 01/18/14 1751  AST 21  ALT 29  ALKPHOS 166*  BILITOT 0.7  ALBUMIN 3.5   Cardiac Enzymes  Recent Labs Lab 01/20/14 1035  PROBNP 36152.0*     Glucose  Recent Labs Lab 01/19/14 2201 01/20/14 0845 01/20/14 1151 01/20/14 1624 01/20/14 2214 01/21/14 0751  GLUCAP 179* 173* 193* 234* 264* 245*    Imaging Dg Chest Port 1 View  01/21/2014   CLINICAL DATA:  Acute respiratory failure  EXAM: PORTABLE CHEST - 1 VIEW  COMPARISON:  01/20/2014  FINDINGS: There is a right IJ porta catheter, tip at the upper right atrium.  Stable heart size and mediastinal contours.  Unchanged hazy appearance of the lower chest consistent with atelectasis and pleural effusions. No pulmonary edema or pneumothorax.  IMPRESSION: Layering pleural effusions and  basilar atelectasis. There may be superimposed pneumonia, especially at the left base.   Electronically Signed   By: Jorje Guild M.D.   On: 01/21/2014 05:31   Dg Chest Port 1 View  01/20/2014   CLINICAL DATA:  Respiratory distress, shortness of breath today, history asthma, atrial fibrillation, diabetes, GERD, lymphoma  EXAM: PORTABLE CHEST - 1 VIEW  COMPARISON:  Portable exam 0957 hr compared to 01/18/2014  FINDINGS: RIGHT jugular power port with tip projecting over SVC near cavoatrial junction.  Mild enlargement of cardiac silhouette with pulmonary vascular congestion.  Atherosclerotic calcification aorta.  Cannot  exclude minimal pulmonary edema.  Additional atelectasis versus consolidation in LEFT lower lobe with associated LEFT pleural effusion.  No pneumothorax.  Scattered endplate spur formation thoracic spine.  IMPRESSION: LEFT pleural effusion with atelectasis versus consolidation in LEFT lower lobe.  Enlargement of cardiac silhouette with pulmonary vascular congestion, cannot exclude minimal pulmonary edema.   Electronically Signed   By: Lavonia Dana M.D.   On: 01/20/2014 10:23      ASSESSMENT / PLAN:  PULMONARY A: Acute hypoxic respiratory failure ? LL PNA based on presentation CXR L > R effusions Hx of OSA H of asthma. P:   Oxygen to keep SpO2 > 92% F/u CXR Brovana, pulmicort, with prn albuterol Continue singulair Hold outpt advair for now, restart soon CPAP qhs  CARDIOVASCULAR Port A:  Severe sepsis, septic shock Hx of permanent A fib on chronic coumadin, HLD, HTN. P:  Continue IV fluids Norepi being weaned; consider CVC placement to guide volume if unable to wean ? whether her port-a -cath will be at risk due to her bacteremia. Will need serial blood cx's to insure clearance Continue digoxin Hold maxzide, dilt for now  Continue zetia, pravastatin Coumadin per pharmacy, note INR 3.0 11/23  RENAL A:   Acute oliguric renal failure Hypokalemia, resolved P:   Follow UOP, BMP Replace electrolytes as needed  GASTROINTESTINAL A:   Hx of GERD. 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 Lymphocytosis >> received neulasta recently as part of chemo regimen. Anemia, thrombocytopenia. P:  F/u CBC Continue allopurinol after chemo for lymphoma  INFECTIOUS A:   Severe sepsis from PNA, UTI, E coli bacteremia. Diarrhea developed 11/21. C diff negative 11/22 P:   Day 3 of vancomycin, cefepime on 11/22 >> change to ceftriaxone 11/23 to complete course for E coli bacteremia D/c flagyl 11/23  Day 1 ceftriaxone 11/23 (has a  PCN allergy, but did tolerate cefepime)   Blood cx 11/20 >> E coli Urine cx 11/20 >> E coli C diff PCR 11/21 >> negative Blood cx 11/22 >>   ENDOCRINE A:   Hx of DM type II, hypothyroidism. Relative adrenal insufficiency >> received decadron with chemo. P:   SSI Hold metformin for now Continue synthroid Started solu cortef 11/22  NEUROLOGIC A:   Acute encephalopathy 2nd to severe sepsis, Improved P:   Monitor mental status   FAMILY    - Updates: patient and daughter updated in full 11/23  - Inter-disciplinary family meet or Palliative Care meeting due by 01/27/14   TODAY'S SUMMARY:  Septic shock with E coli bacteremia, UTI.  Recently received chemo for Lymphoma.  Pressors started. Note acute renal failure, metabolic acidosis. Stable but at risk for worsening.    CC time 35 minutes.  Baltazar Apo, MD, PhD 01/21/2014, 10:00 AM [facility] [phone] or if no answer [phone]

## 2014-01-21 NOTE — Progress Notes (Signed)
CRITICAL VALUE ALERT  Critical value received:  WBC= 60  Date of notification:  01/21/14  Time of notification:  3818  Critical value read back:Yes.    Nurse who received alert:  Jari Favre RN  MD notified (1st page):  E-Link  Time of first page:  [phone]  MD notified (2nd page):  Time of second page:  Responding MD:  Dr Lake Bells  Time MD responded:  [phone]

## 2014-01-21 NOTE — Progress Notes (Signed)
eLink Physician-Brief Progress Note Patient Name: Patty [name] DOB: 04-11-1940 MRN: [id number]   Date of Service  01/21/2014  HPI/Events of Note  Metabolic acidosis on ABG Work of breathing increased slightly  eICU Interventions  BIPAP now Repeat ABG later this morning     Intervention Category Major Interventions: Respiratory failure - evaluation and management  [name] 01/21/2014, 3:36 AM

## 2014-01-21 NOTE — Progress Notes (Signed)
Pt removed herself from BiPAP.  Pt complains of nausea with a couple episodes of dry heaves.  Pt received zofran from RN but still nauseous.  RN to alert the MD of situation.

## 2014-01-21 NOTE — Progress Notes (Signed)
Foley cath placed, 125 ml  amber colored urine rerturned. Pt tolerated procedure ok.

## 2014-01-21 NOTE — Progress Notes (Signed)
eLink Physician-Brief Progress Note Patient Name: Patty [name] DOB: 05/01/1940 MRN: [id number]   Date of Service  01/21/2014  HPI/Events of Note  Called by RN hypoxemia, resp distress Had received fluid for oliguria Chart reviewed, septic shock, normal EF  eICU Interventions  Hold off on lasix given high levophed requirement CXR port now, ABG now Fentanyl prn dyspnea     Intervention Category Intermediate Interventions: Respiratory distress - evaluation and management  Rohaan Durnil 01/21/2014, 2:34 AM

## 2014-01-21 NOTE — Progress Notes (Signed)
MESSIAH ROVIRA   DOB:1940/12/22   OI#:[id number]   IPJ#:[id number]  Patient Care Team: Horatio Pel, MD as PCP - General (Internal Medicine) Laverda Page, MD as Consulting Physician (Cardiology)  I have seen the patient, examined her and edited the notes as follows  Subjective:  Ms. Corson is a 73 year old woman with a recent diagnosis of high-grade follicular lymphoma, status post cycle 1 of chemotherapy with bendamustine and rituximab on 01/09/2014, with Neulasta on 11/13, admitted On 01/18/2014 with fever after 105 Fahrenheit and chills, as well as confusion.The patient had reported cough, without sputum production. Chest x-ray demonstrated possible left lower lobe pneumonia, cultures eventually returned positive for Escherichia coli in urine. She was placed on broad-spectrum antibiotics With IV vancomycin and cefepime, nebulizers and oxygen as needed with improvement of her symptoms. Today, Her fever and confusion have resolved; the patient's respiratory status improved, Her confusion has resolved, she has occasional palpitations due to atrial fibrillation treated with an diuresis makes;however she continues to have intermittent nausea, and complains of abdominal distention. Her appetite is decreased. Denies vomiting. She does have diarrhea. No bleeding issues reported. 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   12/07/2013 Pathology Results Accession: [id number] biopsies show high-grade follicular lymphoma.   01/03/2014 Bone Marrow Biopsy Accession: [id number] Bone marrow biopsy is negative   01/04/2014 Imaging ECHO showed normal EF   01/04/2014 Procedure She has placement of port   01/09/14 Chemotherapy C1D1 with  bendamustine and rituximab with Neulasta on 01/11/14        Scheduled Meds: . acyclovir  400 mg Oral Daily  . allopurinol  300 mg Oral Daily  . arformoterol  15 mcg Nebulization BID  . budesonide (PULMICORT) nebulizer solution  0.25 mg Nebulization BID  . ceFEPime (MAXIPIME) IV  1 g Intravenous Q8H  . cholecalciferol  2,000 Units Oral Q lunch  . digoxin  125 mcg Oral Daily  . ezetimibe  10 mg Oral Daily  . hydrocortisone sod succinate (SOLU-CORTEF) inj  50 mg Intravenous Q6H  . insulin aspart  0-15 Units Subcutaneous TID WC  . insulin aspart  0-5 Units Subcutaneous QHS  . insulin glargine  10 Units Subcutaneous Daily  . levothyroxine  250 mcg Oral QAC breakfast  . magnesium oxide  400 mg Oral QHS  . montelukast  10 mg Oral Daily  . omega-3 acid ethyl esters  1 g Oral BID  . potassium chloride SA  20 mEq Oral Daily  . pravastatin  20 mg Oral Once per day on Mon Tue Wed Thu  . sodium chloride  3 mL Intravenous Q12H  . vancomycin  500 mg Intravenous Q12H  . Warfarin - Pharmacist Dosing Inpatient   Does not apply q1800   Continuous Infusions: . norepinephrine (LEVOPHED) Adult infusion 11 mcg/min (01/21/14 0806)   PRN Meds:acetaminophen **OR** acetaminophen, albuterol,  alum & mag hydroxide-simeth, fentaNYL, ondansetron **OR** ondansetron (ZOFRAN) IV, promethazine   Objective:  Filed Vitals:   01/21/14 0800  BP:   Pulse:   Temp: 97.7 F (36.5 C)  Resp:       Intake/Output Summary (Last 24 hours) at 01/21/14 0843 Last data filed at 01/21/14 0524  Gross per 24 hour  Intake 3900.64 ml  Output     311 ml  Net 3589.64 ml    ECOG PERFORMANCE STATUS: 3  GENERAL:alert,In moderate degree of discomfort, due to shortness of breath and intermittent nausea. Ill appearing SKIN: skin color, texture, turgor are pale and dry, no rashes or significant lesions EYES: normal, conjunctiva are pink and non-injected, sclera clear OROPHARYNX:no exudate, no erythema and lips, buccal mucosa, and tongue normal  NECK: supple, thyroid normal size, non-tender, without nodularity LYMPH:  no palpable lymphadenopathy in the cervical, axillary or inguinal LUNGS: clear to auscultation and percussion with normal breathing effort HEART: irregular rate & rhythm and no murmurs and no lower extremity edema ABDOMEN:Somewhat distended, non-tender and normal bowel sounds Musculoskeletal:no cyanosis of digits and no clubbing  PSYCH: alert & oriented x 3 with fluent speech NEURO: no focal motor/sensory deficits    CBG (last 3)   Recent Labs  01/20/14 1624 01/20/14 2214 01/21/14 0751  GLUCAP 234* 264* 245*     Labs:   Recent Labs Lab 01/18/14 1751 01/19/14 0726 01/20/14 0535 01/21/14 0520  WBC 21.9* 26.4* 37.9* 60.0*  HGB 10.8* 9.2* 10.1* 10.6*  HCT 33.4* 28.1* 31.2* 32.6*  PLT 86* 67* 69* 124*  MCV 84.3 84.1 85.2 84.5  MCH 27.3 27.5 27.6 27.5  MCHC 32.3 32.7 32.4 32.5  RDW 14.5 14.9 15.3 15.7*  LYMPHSABS 0.7  --   --   --   MONOABS 0.9  --   --   --   EOSABS 0.2  --   --   --   BASOSABS 0.0  --   --   --      Chemistries:    Recent Labs Lab 01/18/14 1751 01/19/14 0726 01/20/14 1849 01/21/14 0407  NA 137 136* 135* 134*  K 3.5* 3.4* 4.2 3.9  CL 99 104 105 105  CO2 24 20 14* 12*  GLUCOSE 213* 207* 269* 301*  BUN 14 16 38* 44*  CREATININE 0.75 0.72 2.06* 2.13*  CALCIUM 9.3 7.8* 7.5* 7.7*  AST 21  --   --   --   ALT 29  --   --   --   ALKPHOS 166*  --   --   --   BILITOT 0.7  --   --   --     GFR Estimated Creatinine Clearance: 21.4 mL/min (by C-G formula based on Cr of  2.13).  Liver Function Tests:  Recent Labs Lab 01/18/14 1751  AST 21  ALT 29  ALKPHOS 166*  BILITOT 0.7  PROT 6.7  ALBUMIN 3.5   No results for input(s): LIPASE, AMYLASE in the last 168 hours. No results for input(s): AMMONIA in the last 168 hours.  Urine Studies     Component Value Date/Time   COLORURINE YELLOW 01/18/2014 1754   APPEARANCEUR CLOUDY* 01/18/2014 1754   LABSPEC 1.014 01/18/2014 1754   PHURINE 7.0 01/18/2014 1754   GLUCOSEU NEGATIVE 01/18/2014 1754   HGBUR MODERATE* 01/18/2014 1754   BILIRUBINUR NEGATIVE 01/18/2014 1754   KETONESUR 15* 01/18/2014 1754   PROTEINUR 100* 01/18/2014 1754   UROBILINOGEN 1.0 01/18/2014 1754   NITRITE NEGATIVE 01/18/2014  [place] 01/18/2014 1754    Coagulation profile  Recent Labs Lab 01/18/14 1751 01/19/14 0726 01/20/14 0535 01/21/14 0407  INR 2.20* 2.23* 2.06* 3.00*  CBG:  Recent Labs Lab 01/20/14 0845 01/20/14 1151 01/20/14 1624 01/20/14 2214 01/21/14 0751  GLUCAP 173* 193* 234* 264* 245*  Microbiology Recent Results (from the past 240 hour(s))  Blood Culture (routine x 2)     Status: None   Collection Time: 01/18/14  5:51 PM  Result Value Ref Range Status   Specimen Description BLOOD RIGHT ARM  5 ML IN [facility] BOTTLE  Final   Special Requests NONE  Final   Culture  Setup Time   Final    01/18/2014 22:37 Performed at [facility]    Culture   Final    ESCHERICHIA COLI Note: Gram Stain Report Called to,Read Back By and Verified With: Maretta Bees 01/19/14 @ 12:54PM BY RUSCOE A. Performed at [facility]    Report Status 01/21/2014 FINAL  Final   Organism ID, Bacteria ESCHERICHIA COLI  Final      Susceptibility   Escherichia coli - MIC*    AMPICILLIN <=2 SENSITIVE Sensitive     AMPICILLIN/SULBACTAM <=2 SENSITIVE Sensitive     CEFAZOLIN <=4 SENSITIVE Sensitive     CEFEPIME <=1 SENSITIVE Sensitive     CEFTAZIDIME <=1 SENSITIVE Sensitive     CEFTRIAXONE <=1 SENSITIVE  Sensitive     CIPROFLOXACIN <=0.25 SENSITIVE Sensitive     GENTAMICIN >=16 RESISTANT Resistant     IMIPENEM <=0.25 SENSITIVE Sensitive     PIP/TAZO <=4 SENSITIVE Sensitive     TOBRAMYCIN 8 INTERMEDIATE Intermediate     TRIMETH/SULFA <=20 SENSITIVE Sensitive     * ESCHERICHIA COLI  Urine culture     Status: None (Preliminary result)   Collection Time: 01/18/14  5:55 PM  Result Value Ref Range Status   Specimen Description URINE, CATHETERIZED  Final   Special Requests NONE  Final   Culture  Setup Time   Final    01/18/2014 22:48 Performed at [facility]   Final    >=100,000 COLONIES/ML Performed at [facility]    Culture   Final    [place] Performed at [facility]    Report Status PENDING  Incomplete  Blood Culture (routine x 2)     Status: None   Collection Time: 01/18/14  6:04 PM  Result Value Ref Range Status   Specimen Description BLOOD  Final   Special Requests BOTTLES DRAWN AEROBIC ONLY 3 CC  Final   Culture  Setup Time   Final    01/18/2014 22:38 Performed at [facility]    Culture   Final    ESCHERICHIA COLI Note: SUSCEPTIBILITIES PERFORMED ON PREVIOUS CULTURE WITHIN THE LAST 5 DAYS. Note: Gram Stain Report Called to,Read Back By and Verified With: VERA WILLIAMS 01/19/14 @ 12:54PM BY RUSCOE A. Performed at [facility]    Report Status 01/21/2014 FINAL  Final  Clostridium Difficile by PCR     Status: None   Collection Time: 01/20/14  6:17 AM  Result Value Ref Range Status   C difficile by pcr NEGATIVE NEGATIVE Final    Comment: Performed at [facility]       Imaging Studies:  Dg Chest Port 1 View  01/21/2014   CLINICAL DATA:  Acute respiratory failure  EXAM: PORTABLE CHEST - 1 VIEW  COMPARISON:  01/20/2014  FINDINGS: There is a right IJ porta catheter, tip at the upper right atrium.  Stable heart size and mediastinal contours.  Unchanged hazy appearance of the  lower chest consistent with atelectasis and pleural effusions. No pulmonary edema or pneumothorax.  IMPRESSION: Layering pleural effusions and basilar atelectasis. There may be superimposed pneumonia, especially at the left base.   Electronically Signed   By: Jorje Guild M.D.   On: 01/21/2014 05:31   Dg Chest Port 1 View  01/20/2014   CLINICAL DATA:  Respiratory distress, shortness of breath today, history asthma, atrial fibrillation, diabetes, GERD, lymphoma  EXAM: PORTABLE CHEST - 1 VIEW  COMPARISON:  Portable exam 0957 hr compared to 01/18/2014  FINDINGS: RIGHT jugular power port with tip projecting over SVC near cavoatrial junction.  Mild enlargement of cardiac silhouette with pulmonary vascular congestion.  Atherosclerotic calcification aorta.  Cannot exclude minimal pulmonary edema.  Additional atelectasis versus consolidation in LEFT lower lobe with associated LEFT pleural effusion.  No pneumothorax.  Scattered endplate spur formation thoracic spine.  IMPRESSION: LEFT pleural effusion with atelectasis versus consolidation in LEFT lower lobe.  Enlargement of cardiac silhouette with pulmonary vascular congestion, cannot exclude minimal pulmonary edema.   Electronically Signed   By: Lavonia Dana M.D.   On: 01/20/2014 10:23    Assessment/Plan: 73 y.o.  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  status post cycle 1 of chemotherapy with bendamustine and rituximab on 01/09/2014-bendamustine on day 1 and 2 with rituximab on day 1 and q 28 days, for planned 6 cycles for curative intent Neulasta support was given on 11/13. Continue aggressive supportive care.  Atrial fibrillation Her most recent echocardiogram show preserved ejection fraction. On Cardizem, Digoxin And Coumadin Recommend close follow-up and INR monitoring  Anemia Thrombocytopenia This is likely anemia of chronic disease and recent chemotherapy The patient denies recent  history of bleeding such as epistaxis, hematuria or hematochezia.  She does not require transfusion now unless hemoglobin less than 8 g.  Leukocytosis In the setting of Lymphoma, recent Neulasta on 11/13,Steroids, Inflammation, fever, and dyspnea  At high risk of tumor lysis syndrome Continue fluid hydration and allopurinol  Abdominal distention and nausea Diarrhea May be due to recent chemotherapy C. difficile is negative Supportive care with antiemetics and antidiarrheals are recommended Consider an acute abdominal series if symptoms continue,by primary team  Escherichia coli in urine On vancomycin IV per primary team  DVT prophylaxis On Coumadin  Full Code  Other medical issues as per admitting team We'll follow.   **Disclaimer: This note was dictated with voice recognition software. Similar sounding words can inadvertently be transcribed and this note may contain transcription errors which may not have been corrected upon publication of note.Sharene Butters E, PA-C 01/21/2014  8:43 AM Yarisbel Miranda, MD 01/21/2014

## 2014-01-21 NOTE — Progress Notes (Signed)
Pt's SpO2 dipping down into the mid to upper 80% range, but with a waveform that is going in and out.  Pt states she has an increased WOB. PRN treatment given and O2 bleeding in increased to 6 Lpm.  Pt stable and feeling some better.  If increased WOB returns, then will see if Pt is agreeable to switching her to a full face mask as opposed to her current nasal pillows and increasing the minimum pressure setting in the Pt's CPAP automode setting.

## 2014-01-21 NOTE — Progress Notes (Signed)
eLink Physician-Brief Progress Note Patient Name: Patty [name] DOB: 07/09/1940 MRN: [id number]   Date of Service  01/21/2014  HPI/Events of Note  Sepsis, oliguria  eICU Interventions  Foley, 500cc bolus now     Intervention Category Intermediate Interventions: Oliguria - evaluation and management  MCQUAID, DOUGLAS 01/21/2014, 12:44 AM

## 2014-01-21 NOTE — Progress Notes (Signed)
At about 0500, Pt started having dry hives on the BIPAP, complaining of nausea, BIPAP was taken off pt's face, 4mg  Zofran was administered IV. E-Link MD notified.

## 2014-01-21 NOTE — Progress Notes (Signed)
RT placed pt on auto titrate CPAP 5-18cmH2O with 5L of oxygen bled in via home nasal pillows. Sterile water is in the humidifier. RT will continue to monitor as needed.

## 2014-01-21 NOTE — Progress Notes (Signed)
   Patient somewhat somnolent On her CPAP RN concerned about breathing pattern which from camera looks - shallow and paradoxical RASS -1/-2 forpatient  PLAN Stat abg and cxr Based on results decide - bipap v intubation  Dr. Brand Males, M.D., [facility].C.P Pulmonary and Critical Care Medicine Staff Physician [facility] Pager: [phone], If no answer or between  15:00h - 7:00h: call [phone]  01/21/2014 11:02 PM

## 2014-01-22 ENCOUNTER — Inpatient Hospital Stay (HOSPITAL_COMMUNITY): Payer: Medicare Other

## 2014-01-22 DIAGNOSIS — R451 Restlessness and agitation: Secondary | ICD-10-CM

## 2014-01-22 DIAGNOSIS — R6521 Severe sepsis with septic shock: Secondary | ICD-10-CM

## 2014-01-22 DIAGNOSIS — N19 Unspecified kidney failure: Secondary | ICD-10-CM

## 2014-01-22 DIAGNOSIS — J9600 Acute respiratory failure, unspecified whether with hypoxia or hypercapnia: Secondary | ICD-10-CM

## 2014-01-22 DIAGNOSIS — R410 Disorientation, unspecified: Secondary | ICD-10-CM

## 2014-01-22 LAB — GLUCOSE, CAPILLARY
GLUCOSE-CAPILLARY: 237 mg/dL — AB (ref 70–99)
Glucose-Capillary: 219 mg/dL — ABNORMAL HIGH (ref 70–99)
Glucose-Capillary: 229 mg/dL — ABNORMAL HIGH (ref 70–99)

## 2014-01-22 LAB — BLOOD GAS, ARTERIAL
ACID-BASE DEFICIT: 9.6 mmol/L — AB (ref 0.0–2.0)
Acid-base deficit: 3.4 mmol/L — ABNORMAL HIGH (ref 0.0–2.0)
BICARBONATE: 17.2 meq/L — AB (ref 20.0–24.0)
BICARBONATE: 21.6 meq/L (ref 20.0–24.0)
DELIVERY SYSTEMS: POSITIVE
Drawn by: 31814
Drawn by: 319961
FIO2: 0.5 %
Mode: POSITIVE
O2 Content: 10 L/min
O2 SAT: 91 %
O2 SAT: 95.4 %
PATIENT TEMPERATURE: 98.4
PEEP/CPAP: 5 cmH2O
PO2 ART: 74 mmHg — AB (ref 80.0–100.0)
Patient temperature: 98.4
RATE: 14 resp/min
TCO2: 16.7 mmol/L (ref 0–100)
TCO2: 20.5 mmol/L (ref 0–100)
VT: 500 mL
pCO2 arterial: 43.4 mmHg (ref 35.0–45.0)
pCO2 arterial: 41.1 mmHg (ref 35.0–45.0)
pH, Arterial: 7.22 — ABNORMAL LOW (ref 7.350–7.450)
pH, Arterial: 7.34 — ABNORMAL LOW (ref 7.350–7.450)
pO2, Arterial: 111 mmHg — ABNORMAL HIGH (ref 80.0–100.0)

## 2014-01-22 LAB — BASIC METABOLIC PANEL
ANION GAP: 15 (ref 5–15)
BUN: 59 mg/dL — ABNORMAL HIGH (ref 6–23)
CHLORIDE: 100 meq/L (ref 96–112)
CO2: 18 meq/L — AB (ref 19–32)
Calcium: 8.2 mg/dL — ABNORMAL LOW (ref 8.4–10.5)
Creatinine, Ser: 2.3 mg/dL — ABNORMAL HIGH (ref 0.50–1.10)
GFR calc Af Amer: 23 mL/min — ABNORMAL LOW (ref >90)
GFR calc non Af Amer: 20 mL/min — ABNORMAL LOW (ref >90)
Glucose, Bld: 225 mg/dL — ABNORMAL HIGH (ref 70–99)
Potassium: 4 mEq/L (ref 3.7–5.3)
SODIUM: 133 meq/L — AB (ref 137–147)

## 2014-01-22 LAB — URINE CULTURE: Colony Count: >=100000

## 2014-01-22 LAB — PROTIME-INR
INR: 4.83 — AB (ref 0.00–1.49)
Prothrombin Time: 45.5 seconds — ABNORMAL HIGH (ref 11.6–15.2)

## 2014-01-22 LAB — TROPONIN I: Troponin I: <0.3 ng/mL (ref <0.30)

## 2014-01-22 LAB — CBC
HCT: 28 % — ABNORMAL LOW (ref 36.0–46.0)
Hemoglobin: 9.3 g/dL — ABNORMAL LOW (ref 12.0–15.0)
MCH: 27.6 pg (ref 26.0–34.0)
MCHC: 33.2 g/dL (ref 30.0–36.0)
MCV: 83.1 fL (ref 78.0–100.0)
PLATELETS: 110 10*3/uL — AB (ref 150–400)
RBC: 3.37 MIL/uL — AB (ref 3.87–5.11)
RDW: 15.9 % — ABNORMAL HIGH (ref 11.5–15.5)
WBC: 43.9 10*3/uL — AB (ref 4.0–10.5)

## 2014-01-22 LAB — MAGNESIUM: Magnesium: 2.2 mg/dL (ref 1.5–2.5)

## 2014-01-22 LAB — PHOSPHORUS: Phosphorus: 4.9 mg/dL — ABNORMAL HIGH (ref 2.3–4.6)

## 2014-01-22 LAB — PROCALCITONIN: Procalcitonin: 17.55 ng/mL

## 2014-01-22 LAB — LACTIC ACID, PLASMA: Lactic Acid, Venous: 1 mmol/L (ref 0.5–2.2)

## 2014-01-22 MED ORDER — LORAZEPAM 2 MG/ML IJ SOLN
INTRAMUSCULAR | Status: AC
  Filled 2014-01-22: qty 1

## 2014-01-22 MED ORDER — SODIUM BICARBONATE 8.4 % IV SOLN: INTRAVENOUS | Status: DC

## 2014-01-22 MED ORDER — MIDAZOLAM HCL 2 MG/2ML IJ SOLN
INTRAMUSCULAR | Status: AC
  Filled 2014-01-22: qty 2

## 2014-01-22 MED ORDER — SODIUM BICARBONATE 8.4 % IV SOLN
100.0000 meq | Freq: Once | INTRAVENOUS | Status: AC
  Administered 2014-01-22: 100 meq via INTRAVENOUS
  Filled 2014-01-22: qty 100

## 2014-01-22 MED ORDER — FENTANYL CITRATE 0.05 MG/ML IJ SOLN
INTRAMUSCULAR | Status: AC
  Administered 2014-01-22: 100 ug
  Filled 2014-01-22: qty 2

## 2014-01-22 MED ORDER — FUROSEMIDE 10 MG/ML IJ SOLN
40.0000 mg | Freq: Two times a day (BID) | INTRAMUSCULAR | Status: DC
  Administered 2014-01-22 – 2014-01-24 (×3): 40 mg via INTRAVENOUS
  Filled 2014-01-22 (×3): qty 4

## 2014-01-22 MED ORDER — FENTANYL CITRATE 0.05 MG/ML IJ SOLN
50.0000 ug | INTRAMUSCULAR | Status: DC | PRN
Start: 2014-01-22 — End: 2014-01-24
  Administered 2014-01-22 – 2014-01-23 (×4): 50 ug via INTRAVENOUS
  Filled 2014-01-22 (×5): qty 2

## 2014-01-22 MED ORDER — CETYLPYRIDINIUM CHLORIDE 0.05 % MT LIQD
7.0000 mL | Freq: Four times a day (QID) | OROMUCOSAL | Status: DC
  Administered 2014-01-23 – 2014-01-28 (×19): 7 mL via OROMUCOSAL

## 2014-01-22 MED ORDER — SODIUM BICARBONATE 650 MG PO TABS: 650.0000 mg | ORAL_TABLET | Freq: Three times a day (TID) | ORAL | Status: DC

## 2014-01-22 MED ORDER — LIDOCAINE HCL (CARDIAC) 20 MG/ML IV SOLN
INTRAVENOUS | Status: AC
  Filled 2014-01-22: qty 5

## 2014-01-22 MED ORDER — INSULIN ASPART 100 UNIT/ML ~~LOC~~ SOLN
0.0000 [IU] | Freq: Three times a day (TID) | SUBCUTANEOUS | Status: DC
  Administered 2014-01-22 (×2): 7 [IU] via SUBCUTANEOUS

## 2014-01-22 MED ORDER — INSULIN ASPART 100 UNIT/ML ~~LOC~~ SOLN
0.0000 [IU] | Freq: Every day | SUBCUTANEOUS | Status: DC
  Administered 2014-01-22: 2 [IU] via SUBCUTANEOUS

## 2014-01-22 MED ORDER — SUCCINYLCHOLINE CHLORIDE 20 MG/ML IJ SOLN
INTRAMUSCULAR | Status: AC
  Filled 2014-01-22: qty 1

## 2014-01-22 MED ORDER — FENTANYL CITRATE 0.05 MG/ML IJ SOLN
INTRAMUSCULAR | Status: AC
  Filled 2014-01-22: qty 2

## 2014-01-22 MED ORDER — MIDAZOLAM HCL 2 MG/2ML IJ SOLN
INTRAMUSCULAR | Status: AC
  Administered 2014-01-22: 2 mg
  Filled 2014-01-22: qty 2

## 2014-01-22 MED ORDER — SODIUM CHLORIDE 0.9 % IV SOLN: INTRAVENOUS | Status: DC | PRN

## 2014-01-22 MED ORDER — ROCURONIUM BROMIDE 50 MG/5ML IV SOLN
INTRAVENOUS | Status: AC
  Filled 2014-01-22: qty 2

## 2014-01-22 MED ORDER — LORAZEPAM 2 MG/ML IJ SOLN
0.5000 mg | INTRAMUSCULAR | Status: DC | PRN
Start: 2014-01-22 — End: 2014-02-01
  Administered 2014-01-22: 0.5 mg via INTRAVENOUS
  Administered 2014-01-25 – 2014-01-27 (×2): 1 mg via INTRAVENOUS
  Filled 2014-01-22 (×3): qty 1

## 2014-01-22 MED ORDER — ETOMIDATE 2 MG/ML IV SOLN
INTRAVENOUS | Status: AC
  Administered 2014-01-22: 20 mg
  Filled 2014-01-22: qty 20

## 2014-01-22 MED ORDER — DEXMEDETOMIDINE HCL IN NACL 200 MCG/50ML IV SOLN
0.0000 ug/kg/h | INTRAVENOUS | Status: DC
  Administered 2014-01-22 – 2014-01-23 (×3): 0.5 ug/kg/h via INTRAVENOUS
  Administered 2014-01-23: 0.6 ug/kg/h via INTRAVENOUS
  Administered 2014-01-23: 0.5 ug/kg/h via INTRAVENOUS
  Administered 2014-01-23: 1 ug/kg/h via INTRAVENOUS
  Administered 2014-01-24: 0.4 ug/kg/h via INTRAVENOUS
  Administered 2014-01-25: 0.3 ug/kg/h via INTRAVENOUS
  Filled 2014-01-22 (×13): qty 50

## 2014-01-22 MED ORDER — CHLORHEXIDINE GLUCONATE 0.12 % MT SOLN
OROMUCOSAL | Status: AC
  Administered 2014-01-22: 15 mL
  Filled 2014-01-22: qty 15

## 2014-01-22 MED ORDER — CHLORHEXIDINE GLUCONATE 0.12 % MT SOLN
15.0000 mL | Freq: Two times a day (BID) | OROMUCOSAL | Status: DC
Start: 2014-01-22 — End: 2014-01-28
  Administered 2014-01-22 – 2014-01-28 (×12): 15 mL via OROMUCOSAL
  Filled 2014-01-22 (×14): qty 15

## 2014-01-22 NOTE — Progress Notes (Signed)
Progress Note   Patty [name]:[id number] DOB: 09/02/1940 DOA: 01/18/2014 PCP: Horatio Pel, MD   Brief Narrative:   Patty [name] is an 73 y.o. female with a PMH of high-grade follicular lymphoma diagnosed 11/2013, status post treatment with rituximab and bendamustine 01/10/14 followed by Neulasta support who was admitted on 01/10/14 with chief complaint of fever. Chest x-ray done on admission showed a left lower lobe opacity concerning for healthcare associated pneumonia.  Assessment/Plan:   Principal Problem:   Severe Sepsis/septic shock secondary to Escherichia coli bacteremia/UTI with possible left lower lobe HCAP (healthcare-associated pneumonia)  Continue empiric antibiotics consisting of vancomycin and Rocephin (cefepime discontinued 01/21/14).  One set of blood cultures from 01/18/14 growing Escherichia coli. Urine culture positive for Escherichia coli. Follow-up repeat blood cultures done 01/20/14.  Stress dose steroids started and fluid resuscitation needed 01/20/14 secondary to persistent hypotension.  Remains on Levophed.  Influenza panel negative.   Repeat CXR showed a possible left lower lobe pneumonia.  Critical care consulted 01/20/14 secondary to severe sepsis.  WBC remains markedly elevated, but be beginning to trend down (treated with Neulasta 01/11/14).  PCCM likely to intubate, will tx care to them once intubated.  Active Problems:   Mucositis  MMW ordered.    Acute renal failure  Likely secondary to sepsis/hypotension.  Continue to hydrate and maintain blood pressure with Levophed.  No recovery of renal function yet.    Anion gap metabolic acidosis/respiratory acidosis  Blood gas analysis reveals an anion gap metabolic acidosis with respiratory acidosis.  Lactic acidosis resolved.  Likely secondary to failure of ventilation in the setting of severe sepsis. Uremia may be contributory.  Given IV bicarbonate and placed on  CPAP overnight.    Hypokalemia  Resolved with supplementation.    Acute encephalopathy  Secondary to severe sepsis.    Diarrhea  C diff negative.    Atrial fibrillation / long-term (current) use of anticoagulants  Continue digoxin, diltiazem.  Coumadin per pharmacy. INR supratherapeutic.    Hyperlipemia  Continues zetia and Pravachol.    Hypothyroidism  Continue Synthroid.    Sleep apnea  Continue CPAP daily at bedtime.    Diabetes mellitus type 2, controlled, without complications  Hold metformin.  Currently on moderate scale SSI with 10 units of Lantus daily. CBGs 148-264.  Will change to insulin resistant SSI.     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  Continue allopurinol for prevention of tumor lysis/elevated uric acid.  Continue acyclovir prophylaxis.  Dr. Alvy Bimler following.    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 No current indication for transfusion.    DVT Prophylaxis  On chronic Coumadin.  Code Status: Full. Family Communication:  Husband Mikki Santee [phone]) and brother updated at the bedside. Disposition Plan: Home when stable.   IV Access:    Port-A-Cath   Procedures and diagnostic studies:   Dg Chest Port 1 View 01/18/2014: 1. Left lower lobe airspace opacity. I do note that the patient had a prior left pleural effusion and some of this could be from layering effusion and atelectasis, but left lower lobe pneumonia is certainly not excluded. 2. Borderline cardiomegaly. 3. Atherosclerotic aortic arch. 4. Thoracic spondylosis.   Dg Chest Port 1 View 01/20/2014: LEFT pleural effusion with atelectasis versus consolidation in LEFT lower lobe.  Enlargement of cardiac silhouette with pulmonary vascular congestion, cannot exclude minimal pulmonary edema.     Dg  Chest Port 1 View 01/21/2014: Layering pleural effusions and basilar  atelectasis. There may be superimposed pneumonia, especially at the left base.     Dg Chest Port 1 View 01/21/2014: 1. Persistent bilateral pleural effusions and edema. 2. New right midlung atelectasis. 3. Little change in diminished aeration to both lung bases.     Medical Consultants:    None.  Anti-Infectives:    Cefepime 01/18/14--->01/21/14  Rocephin 01/21/14--->  Vancomycin 01/18/14--->  Flagyl 01/20/14---> 01/21/14  Subjective:   Patty [name] became restless and confused with agitation in the night, attempting to get out of bed with acute delirium/encephalopathy. Family stayed with her, but was quite upset at her change in status.  She is sedated this morning.  Objective:    Filed Vitals:   01/22/14 0200 01/22/14 0300 01/22/14 0424 01/22/14 0436  BP:      Pulse:      Temp:      TempSrc:      Resp: 14 22    Height:      Weight:   71.2 kg (156 lb 15.5 oz)   SpO2: 96% 94%  92%    Intake/Output Summary (Last 24 hours) at 01/22/14 0718 Last data filed at 01/22/14 0300  Gross per 24 hour  Intake 1827.83 ml  Output    206 ml  Net 1621.83 ml    Exam: Gen:  NAD Skin: Diffuse macular rash Cardiovascular:  Tachycardic, No M/R/G Respiratory:  Lungs with bibasilar crackles Gastrointestinal:  Abdomen soft, NT/ND, + BS Extremities:  No C/E/C   Data Reviewed:    Labs: Basic Metabolic Panel:  Recent Labs Lab 01/19/14 0726 01/20/14 1849 01/21/14 0407 01/21/14 1600 01/22/14 0332  NA 136* 135* 134* 135* 133*  K 3.4* 4.2 3.9 4.5 4.0  CL 104 105 105 105 100  CO2 20 14* 12* 17* 18*  GLUCOSE 207* 269* 301* 174* 225*  BUN 16 38* 44* 50* 59*  CREATININE 0.72 2.06* 2.13* 2.28* 2.30*  CALCIUM 7.8* 7.5* 7.7* 8.3* 8.2*  MG  --   --   --   --  2.2  PHOS  --   --   --   --  4.9*   GFR Estimated Creatinine Clearance: 21.4 mL/min (by C-G formula based on Cr of 2.3). Liver Function Tests:  Recent Labs Lab 01/18/14 1751  AST 21  ALT 29  ALKPHOS 166*   BILITOT 0.7  PROT 6.7  ALBUMIN 3.5   Coagulation profile  Recent Labs Lab 01/18/14 1751 01/19/14 0726 01/20/14 0535 01/21/14 0407 01/22/14 0332  INR 2.20* 2.23* 2.06* 3.00* 4.83*    CBC:  Recent Labs Lab 01/18/14 1751 01/19/14 0726 01/20/14 0535 01/21/14 0520 01/22/14 0332  WBC 21.9* 26.4* 37.9* 60.0* 43.9*  NEUTROABS 20.1*  --   --   --   --   HGB 10.8* 9.2* 10.1* 10.6* 9.3*  HCT 33.4* 28.1* 31.2* 32.6* 28.0*  MCV 84.3 84.1 85.2 84.5 83.1  PLT 86* 67* 69* 124* 110*   CBG:  Recent Labs Lab 01/20/14 2214 01/21/14 0751 01/21/14 1321 01/21/14 1605 01/21/14 2103  GLUCAP 264* 245* 204* 148* 150*    Sepsis Labs:  Recent Labs Lab 01/18/14 1758 01/19/14 0726 01/20/14 0535 01/20/14 1035 01/21/14 0520 01/22/14 0332 01/22/14 0333  WBC  --  26.4* 37.9*  --  60.0* 43.9*  --   LATICACIDVEN 1.05  --   --  3.4*  --   --  1.0   Microbiology Recent Results (from the past  240 hour(s))  Blood Culture (routine x 2)     Status: None   Collection Time: 01/18/14  5:51 PM  Result Value Ref Range Status   Specimen Description BLOOD RIGHT ARM  5 ML IN [facility] BOTTLE  Final   Special Requests NONE  Final   Culture  Setup Time   Final    01/18/2014 22:37 Performed at [facility]    Culture   Final    ESCHERICHIA COLI Note: Gram Stain Report Called to,Read Back By and Verified With: VERA WILLIAMS 01/19/14 @ 12:54PM BY RUSCOE A. Performed at [facility]    Report Status 01/21/2014 FINAL  Final   Organism ID, Bacteria ESCHERICHIA COLI  Final      Susceptibility   Escherichia coli - MIC*    AMPICILLIN <=2 SENSITIVE Sensitive     AMPICILLIN/SULBACTAM <=2 SENSITIVE Sensitive     CEFAZOLIN <=4 SENSITIVE Sensitive     CEFEPIME <=1 SENSITIVE Sensitive     CEFTAZIDIME <=1 SENSITIVE Sensitive     CEFTRIAXONE <=1 SENSITIVE Sensitive     CIPROFLOXACIN <=0.25 SENSITIVE Sensitive     GENTAMICIN >=16 RESISTANT Resistant     IMIPENEM <=0.25 SENSITIVE Sensitive      PIP/TAZO <=4 SENSITIVE Sensitive     TOBRAMYCIN 8 INTERMEDIATE Intermediate     TRIMETH/SULFA <=20 SENSITIVE Sensitive     * ESCHERICHIA COLI  Urine culture     Status: None (Preliminary result)   Collection Time: 01/18/14  5:55 PM  Result Value Ref Range Status   Specimen Description URINE, CATHETERIZED  Final   Special Requests NONE  Final   Culture  Setup Time   Final    01/18/2014 22:48 Performed at [facility]   Final    >=100,000 COLONIES/ML Performed at [facility]    Culture   Final    Lyman Performed at [facility]    Report Status PENDING  Incomplete  Blood Culture (routine x 2)     Status: None   Collection Time: 01/18/14  6:04 PM  Result Value Ref Range Status   Specimen Description BLOOD  Final   Special Requests BOTTLES DRAWN AEROBIC ONLY 3 CC  Final   Culture  Setup Time   Final    01/18/2014 22:38 Performed at [facility]    Culture   Final    ESCHERICHIA COLI Note: SUSCEPTIBILITIES PERFORMED ON PREVIOUS CULTURE WITHIN THE LAST 5 DAYS. Note: Gram Stain Report Called to,Read Back By and Verified With: VERA WILLIAMS 01/19/14 @ 12:54PM BY RUSCOE A. Performed at [facility]    Report Status 01/21/2014 FINAL  Final  Clostridium Difficile by PCR     Status: None   Collection Time: 01/20/14  6:17 AM  Result Value Ref Range Status   C difficile by pcr NEGATIVE NEGATIVE Final    Comment: Performed at [facility]  Culture, blood (routine x 2)     Status: None (Preliminary result)   Collection Time: 01/20/14 10:35 AM  Result Value Ref Range Status   Specimen Description BLOOD RIGHT ARM  Final   Special Requests   Final    BOTTLES DRAWN AEROBIC AND ANAEROBIC 10 CC BOTH BOTTLES   Culture  Setup Time   Final    01/20/2014 15:08 Performed at [facility]    Culture   Final           BLOOD CULTURE RECEIVED  NO GROWTH TO DATE CULTURE WILL BE HELD FOR 5  DAYS BEFORE ISSUING A FINAL NEGATIVE REPORT Performed at [facility]    Report Status PENDING  Incomplete  Culture, blood (routine x 2)     Status: None (Preliminary result)   Collection Time: 01/20/14 11:00 AM  Result Value Ref Range Status   Specimen Description BLOOD PAC  10 ML IN [facility] BOTTLE  Final   Special Requests Immunocompromised  Final   Culture  Setup Time   Final    01/20/2014 18:26 Performed at [facility]    Culture   Final           BLOOD CULTURE RECEIVED NO GROWTH TO DATE CULTURE WILL BE HELD FOR 5 DAYS BEFORE ISSUING A FINAL NEGATIVE REPORT Performed at [facility]    Report Status PENDING  Incomplete  MRSA PCR Screening     Status: None   Collection Time: 01/21/14  2:40 PM  Result Value Ref Range Status   MRSA by PCR NEGATIVE NEGATIVE Final    Comment:        The GeneXpert MRSA Assay (FDA approved for NASAL specimens only), is one component of a comprehensive MRSA colonization surveillance program. It is not intended to diagnose MRSA infection nor to guide or monitor treatment for MRSA infections.      Medications:   . acyclovir  400 mg Oral Daily  . allopurinol  300 mg Oral Daily  . arformoterol  15 mcg Nebulization BID  . budesonide (PULMICORT) nebulizer solution  0.25 mg Nebulization BID  . cefTRIAXone (ROCEPHIN)  IV  1 g Intravenous Q24H  . cholecalciferol  2,000 Units Oral Q lunch  . digoxin  125 mcg Oral Daily  . ezetimibe  10 mg Oral Daily  . hydrocortisone sod succinate (SOLU-CORTEF) inj  50 mg Intravenous Q6H  . insulin aspart  0-15 Units Subcutaneous TID WC  . insulin aspart  0-5 Units Subcutaneous QHS  . insulin glargine  10 Units Subcutaneous Daily  . levothyroxine  250 mcg Oral QAC breakfast  . magic mouthwash w/lidocaine  5 mL Oral QID  . magnesium oxide  400 mg Oral QHS  . montelukast  10 mg Oral Daily  . omega-3 acid ethyl esters  1 g Oral BID  . potassium chloride SA  20 mEq Oral Daily  . pravastatin   20 mg Oral Once per day on Mon Tue Wed Thu  . sodium bicarbonate  650 mg Oral TID  . sodium chloride  3 mL Intravenous Q12H  . Warfarin - Pharmacist Dosing Inpatient   Does not apply q1800   Continuous Infusions: . norepinephrine (LEVOPHED) Adult infusion 3 mcg/min (01/22/14 0602)  .  sodium bicarbonate  infusion 1000 mL 100 mL/hr at 01/21/14 2354    Time spent: 35 minutes.  The patient is medically complex and requires high complexity decision making.     LOS: 4 days   [facility] Pager [phone]. If unable to reach me by pager, please call my cell phone at [phone].  *Please refer to [email or web address], password [email or web address] to get updated schedule on who will round on this patient, as hospitalists switch teams weekly. If 7PM-7AM, please contact night-coverage at [email or web address], password [email or web address] for any overnight needs.  01/22/2014, 7:18 AM     Information printed out and reviewed with the patient/family:     In an effort to keep you and your family informed about your hospital stay, I am providing you with  this information sheet. If you or your family have any questions, please do not hesitate to have the nursing staff page me to set up a meeting time.  Also note that the hospitalist doctors typically change on Tuesdays or Wednesdays to a different hospitalist doctor.  Patty [name] 01/22/2014 4 (Number of days in the hospital)  Treatment team:  Dr. Jacquelynn Cree, Hospitalist (Internist)  Dr. Chesley Mires and Dr. Chase Caller, Critical Care Specialists  Dr. Alvy Bimler. Oncologist  Pertinent labs / studies:  Chest x-ray done 01/20/14 shows a possible left lower lobe pneumonia.  No significant change in repeat chest x-ray.  One set of blood cultures and urine cultures growing a bacteria called Escherichia coli.  Stool studies were negative for C. difficile.   Principle Diagnosis: Sepsis (blood poisoning) and UTI caused by Escherichia coli, delirium, acute kidney  injury related to sepsis   Plan for today: 1. Continue antibiotics, which were adjusted based on culture results. 2. Adjust insulin for better sugar control. 3. Continue to monitor in the ICU.  Anticipated discharge date: Depends on progress.

## 2014-01-22 NOTE — Progress Notes (Signed)
Nurse noted patient had change in rhythm on monitor. 12 Lead ECG done . E link notified.

## 2014-01-22 NOTE — Progress Notes (Signed)
Summary : at 2030 01/21/14; she became very confused and agitated, attempting to get OOB, and not able to be reasoned with. Daughter at bedside and agreed to stay .Daughter states she has never seen her like this before.Consulted Dr. Onnie Graham at 2100 for persistent agitation and  3mg  of IV Haldol was given,and she was placed on CPAP.At  2245, she was pale, had slow abnormal respiratory pattern. Abg and CXR were done. PH 7.1. Metabolic acidosis. Bicarb push, and bicarb infusion initiated with improvement in respiratory status. She has continued to be restless c/o chronic back pain,attempting to get up OOB. Fentanyl 46mcg given every 2 hours. Daughter and Daughter's husband have remained at the bedside, however this morning they are both in tears about her status. Continues on low dose Levophed via chest Portacath; Aline RRadial.,. Continue close observation and emotional support to both patient and family.

## 2014-01-22 NOTE — Procedures (Signed)
Intubation Procedure Note ALLISSON SCHINDEL [id number] 1940-09-02  Procedure: Intubation Indications: Respiratory insufficiency  Procedure Details Consent: Risks of procedure as well as the alternatives and risks of each were explained to the (patient/caregiver).  Consent for procedure obtained. Time Out: Verified patient identification, verified procedure, site/side was marked, verified correct patient position, special equipment/implants available, medications/allergies/relevent history reviewed, required imaging and test results available.  Performed  MAC and 3 Medications:  Fentanyl 100 mcg Etomidate 20 mg Versed 2 mg NMB    Evaluation Hemodynamic Status: BP stable throughout; O2 sats: stable throughout Patient's Current Condition: stable Complications: No apparent complications Patient did tolerate procedure well. Chest X-ray ordered to verify placement.  CXR: pending.   Richardson Landry Minor ACNP Maryanna Shape PCCM Pager [phone] till 3 pm If no answer page [phone] 01/22/2014, 11:25 AM  Baltazar Apo, MD, PhD 01/22/2014, 12:30 PM [facility] [phone] or if no answer [phone]

## 2014-01-22 NOTE — Progress Notes (Signed)
ANTICOAGULATION CONSULT NOTE - Follow Up  Pharmacy Consult for Warfarin Indication: atrial fibrillation  Allergies  Allergen Reactions  . Morphine And Related Anaphylaxis and Other (See Comments)    Pt states she stopped breathing post op- "went into resp. arrest"  . Demerol Nausea And Vomiting  . Meperidine Hcl Nausea And Vomiting  . Multaq [Dronedarone] Other (See Comments)    Reaction:  Blood in urine and elevated liver enzymes.   Jeanie Cooks Allergy] Rash  . Penicillins Itching and Rash  . Sulfa Drugs Cross Reactors Rash   Patient Measurements: Height: 5\' 6"  (167.6 cm) Weight: 156 lb 15.5 oz (71.2 kg) IBW/kg (Calculated) : 59.3  Vital Signs: Temp: 98.4 F (36.9 C) (11/24 0800) Temp Source: Oral (11/24 0800) BP: 117/48 mmHg (11/24 0819) Pulse Rate: 102 (11/24 0819)  Labs:  Recent Labs  01/20/14 0535  01/21/14 0407 01/21/14 0520 01/21/14 1600 01/22/14 0332  HGB 10.1*  --   --  10.6*  --  9.3*  HCT 31.2*  --   --  32.6*  --  28.0*  PLT 69*  --   --  124*  --  110*  LABPROT 23.4*  --  31.4*  --   --  45.5*  INR 2.06*  --  3.00*  --   --  4.83*  CREATININE  --   < > 2.13*  --  2.28* 2.30*  < > = values in this interval not displayed. Estimated Creatinine Clearance: 21.4 mL/min (by C-G formula based on Cr of 2.3).  Medical History: Past Medical History  Diagnosis Date  . Asthma 06/15/2010  . Hypothyroidism 06/15/2010  . Hyperlipemia 06/15/2010  . Atrial fibrillation 05/28/2010    Managed with rate control and coumadin  . Long-term (current) use of anticoagulants   . ACE-inhibitor cough   . DI (detrusor instability)   . Fibroid   . Rectocele   . Atrophic vaginitis   . 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 Diabetes mellitus     Type 2  . Peripheral neuropathy   . Hypertension   . Dysrhythmia     cardioversion - 2012  . Complication of anesthesia 1980's    post anesth.- states she "went into resp. arrest" , but then remarked that she thought  maybe they gave her too much medicine (morphine)   . Shortness of breath     sometimes   . 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 Anemia   . Malignant lymphoma, follicular 27/0/35  . CPAP (continuous positive airway pressure) dependence   . GERD (gastroesophageal reflux disease)   . Hearing loss   . Sleep apnea     uses CPAP everynight- last study in [place] 5 yrs. or more   . Severe sepsis with septic shock 01/21/2014  . E coli bacteremia 01/20/2014   Medications:  Scheduled:  . acyclovir  400 mg Oral Daily  . allopurinol  300 mg Oral Daily  . arformoterol  15 mcg Nebulization BID  . budesonide (PULMICORT) nebulizer solution  0.25 mg Nebulization BID  . cefTRIAXone (ROCEPHIN)  IV  1 g Intravenous Q24H  . chlorhexidine      . cholecalciferol  2,000 Units Oral Q lunch  . digoxin  125 mcg Oral Daily  . etomidate      . ezetimibe  10 mg Oral Daily  . hydrocortisone sod succinate (SOLU-CORTEF) inj  50 mg Intravenous Q6H  . insulin aspart  0-20 Units Subcutaneous TID WC  .  insulin aspart  0-5 Units Subcutaneous QHS  . insulin glargine  10 Units Subcutaneous Daily  . levothyroxine  250 mcg Oral QAC breakfast  . lidocaine (cardiac) 100 mg/78ml      . LORazepam      . magic mouthwash w/lidocaine  5 mL Oral QID  . magnesium oxide  400 mg Oral QHS  . midazolam      . montelukast  10 mg Oral Daily  . omega-3 acid ethyl esters  1 g Oral BID  . potassium chloride SA  20 mEq Oral Daily  . pravastatin  20 mg Oral Once per day on Mon Tue Wed Thu  . rocuronium      . sodium chloride  3 mL Intravenous Q12H  . succinylcholine      . Warfarin - Pharmacist Dosing Inpatient   Does not apply q1800  Inpatient warfarin doses administered from 11/20 5mg  prior to admission, 2.5mg , 5mg , none  Assessment: 50 yoF admitted with sepsis secondary to HCAP following first course of chemotherapy for NHL. On chronic Warfarin for Afib: home dose 2.5mg  on Wed, Sat with 5mg  other days, last dose 11/20 (5mg  -  night of admission). Admit INR 2.20, in therapeutic range.    Goal of Therapy:  INR 2-3  Today, 11/23:  INR 4.83 (from 3.0 yesterday)  Pltc improved to 110K post-chemotherapy,   Hgb low,stable post-chemotherapy.  No bleeding reported  On cardiac diet-poor po intake  Received Metronidazole x 3 doses 11/22-11/23 (too early to see effect on INR)  1. No Warfarin today 2. Follow daily PT/INR 3. Follow CBC - following pltc post-chemotherapy 4. Follow clinical course and any reports of bleeding.  Minda Ditto PharmD Pager [phone] 01/22/2014, 9:22 AM

## 2014-01-22 NOTE — Progress Notes (Signed)
EKG abnormal and changed: d Recent Labs Lab 01/21/14 0300 01/21/14 0510 01/21/14 2305 01/22/14 0445 01/22/14 1315  PHART 7.256* 7.295* 7.199* 7.220* 7.340*  PCO2ART 28.5* 25.5* 34.7* 43.4 41.1  PO2ART 70.3* 87.8 92.8 74.0* 111.0*  HCO3 12.3* 12.1* 13.0* 17.2* 21.6  TCO2 11.8 11.5 12.7 16.7 20.5  O2SAT 89.1 93.8 93.7 91.0 95.4    CBC  Recent Labs Lab 01/20/14 0535 01/21/14 0520 01/22/14 0332  HGB 10.1* 10.6* 9.3*  HCT 31.2* 32.6* 28.0*  WBC 37.9* 60.0* 43.9*  PLT 69* 124* 110*    COAGULATION  Recent Labs Lab 01/18/14 1751 01/19/14 0726 01/20/14 0535 01/21/14 0407 01/22/14 0332  INR 2.20* 2.23* 2.06* 3.00* 4.83*    CARDIAC  No results for input(s): TROPONINI in the last 168 hours.  Recent Labs Lab 01/20/14 1035  PROBNP 36152.0*     CHEMISTRY  Recent Labs Lab 01/19/14 0726 01/20/14 1849 01/21/14 0407 01/21/14 1600 01/22/14 0332  NA 136* 135* 134* 135* 133*  K 3.4* 4.2 3.9 4.5 4.0  CL 104 105 105 105 100  CO2 20 14* 12* 17* 18*  GLUCOSE 207* 269* 301* 174* 225*  BUN 16 38* 44* 50* 59*  CREATININE 0.72 2.06* 2.13* 2.28* 2.30*  CALCIUM 7.8* 7.5* 7.7* 8.3* 8.2*  MG  --   --   --   --  2.2  PHOS  --   --   --   --  4.9*   Estimated Creatinine Clearance: 21.4 mL/min (by C-G formula based on Cr of 2.3).   LIVER  Recent Labs Lab 01/18/14 1751 01/19/14 0726 01/20/14 0535 01/21/14 0407 01/22/14 0332  AST 21  --   --   --   --   ALT 29  --   --   --   --   ALKPHOS 166*  --   --   --   --   BILITOT 0.7  --   --   --   --   PROT 6.7  --   --   --   --   ALBUMIN 3.5  --   --   --   --   INR 2.20* 2.23* 2.06* 3.00* 4.83*     INFECTIOUS  Recent Labs Lab 01/18/14 1758 01/20/14 1035 01/22/14 0333  LATICACIDVEN 1.05 3.4* 1.0     ENDOCRINE CBG (last 3)   Recent Labs  01/21/14 2103 01/22/14 0846 01/22/14 1201  GLUCAP 150* 219* 229*         IMAGING x48h Dg Chest Port 1 View  01/22/2014   CLINICAL DATA:  Hypoxia  EXAM:  PORTABLE CHEST - 1 VIEW  COMPARISON:  January 21, 2014  FINDINGS: Endotracheal tube tip is 5.8 cm above the carina. Left jugular catheter tip is in the superior vena cava just beyond the junction with the left innominate vein. Port-A-Cath tip is in the distal superior vena cava near the junction with the atrium. No pneumothorax.  There are bilateral effusions with mild interstitial edema. There is consolidation throughout much of the left lower lobe. Heart is mildly enlarged with pulmonary vascularity within normal limits.  IMPRESSION: Tube and catheter positions as described without pneumothorax. Left lower lobe consolidation. Suspect pneumonia. Findings felt to be indicative of a degree of underlying congestive heart failure as well.   Electronically Signed   By: Lowella Grip M.D.   On: 01/22/2014 12:47   Dg Chest Port 1 View  01/21/2014   CLINICAL DATA:  Respiratory distress  EXAM: PORTABLE  CHEST - 1 VIEW  COMPARISON:  12/21/2013  FINDINGS: Right chest wall port a catheter is noted with tip in the cavoatrial junction. Stable cardiac enlargement. Bilateral pleural effusions are identified left-greater-than-right. These appear slightly increased in volume from previous exam. Stable edema Diminished aeration of both lung bases is identified left greater night. There is mild interstitial edema. New atelectasis within the right midlung noted.  IMPRESSION: 1. Persistent bilateral pleural effusions and edema. 2. New right midlung atelectasis. 3. Little change in diminished aeration to both lung bases.   Electronically Signed   By: Kerby Moors M.D.   On: 01/21/2014 23:47   Dg Chest Port 1 View  01/21/2014   CLINICAL DATA:  Acute respiratory failure  EXAM: PORTABLE CHEST - 1 VIEW  COMPARISON:  01/20/2014  FINDINGS: There is a right IJ porta catheter, tip at the upper right atrium.  Stable heart size and mediastinal contours.  Unchanged hazy appearance of the lower chest consistent with atelectasis and  pleural effusions. No pulmonary edema or pneumothorax.  IMPRESSION: Layering pleural effusions and basilar atelectasis. There may be superimposed pneumonia, especially at the left base.   Electronically Signed   By: Jorje Guild M.D.   On: 01/21/2014 05:31    A /w Cards - definite change. ST depression. Rule out NSTEMI but given acute illness  Not a cath candidate and given high INR - not a IV heparin candidate (patient on coumadin for A Fib). BNP 36k. INR and patient in renal failure  PLAN Start lasix x 1 Get echo x 1 Rule out MI Monitor   Dr. Brand Males, M.D., [facility]C.P Pulmonary and Critical Care Medicine Staff Physician [facility] Pager: [phone], If no answer or between  15:00h - 7:00h: call [phone]  01/22/2014 4:16 PM

## 2014-01-22 NOTE — Procedures (Signed)
Central Venous Catheter Insertion Procedure Note Patty [name] [id number] 09/15/1940  Procedure: Insertion of Central Venous Catheter Indications: Assessment of intravascular volume, Drug and/or fluid administration and Frequent blood sampling  Procedure Details Consent: Risks of procedure as well as the alternatives and risks of each were explained to the (patient/caregiver).  Consent for procedure obtained. Time Out: Verified patient identification, verified procedure, site/side was marked, verified correct patient position, special equipment/implants available, medications/allergies/relevent history reviewed, required imaging and test results available.  Performed  Maximum sterile technique was used including antiseptics, cap, gloves, gown, hand hygiene, mask and sheet. Skin prep: Chlorhexidine; local anesthetic administered A antimicrobial bonded/coated triple lumen catheter was placed in the left internal jugular vein using the Seldinger technique. Ultrasound guidance used.Yes.   Catheter placed to 20 cm. Blood aspirated via all 3 ports and then flushed x 3. Line sutured x 2 and dressing applied.  Evaluation Blood flow good Complications: No apparent complications Patient did tolerate procedure well. Chest X-ray ordered to verify placement.  CXR: pending.  Richardson Landry Minor ACNP [facility] P[facility] Pager [phone] till 3 pm If no answer page [phone] 01/22/2014, 11:26 AM   Baltazar Apo, MD, PhD 01/22/2014, 12:31 PM [facility] [phone] or if no answer [phone]

## 2014-01-22 NOTE — Progress Notes (Signed)
PULMONARY / CRITICAL CARE MEDICINE   Name: Patty [name] MRN: [id number] DOB: 12-31-1940    ADMISSION DATE:  01/18/2014 CONSULTATION DATE:  01/20/2014  REFERRING MD : Dr. Rockne Menghini  CHIEF COMPLAINT: Confusion  INITIAL PRESENTATION:  73 yo female presented with altered mental status, nausea, fever (105.1), ? LLL infiltrate on CXR. Dx w E coli bacteremia. She has hx of follicular lymphoma (first chemo on 01/09/14), A fib on coumadin, HTN, HLD, Hypothyroidism, DM.  STUDIES:  11/06 Echo >> EF 55 to 60%  SIGNIFICANT EVENTS: 11/20 Admit 11/22 Transfer to SDU with hypotension 11/23 Started on BiPAP for increased WOB in setting progressive metabolic acidosis.  11/24 worsening resp distress.  SUBJECTIVE:  Worsening resp distress. May need intubation   VITAL SIGNS: Temp:  [97.4 F (36.3 C)-98.5 F (36.9 C)] 98.4 F (36.9 C) (11/24 0000) Pulse Rate:  [26-141] 114 (11/23 2155) Resp:  [12-33] 12 (11/24 0700) BP: (102-119)/(47-58) 119/58 mmHg (11/23 2300) SpO2:  [89 %-100 %] 95 % (11/24 0803) FiO2 (%):  [40 %] 40 % (11/23 1822) Weight:  [156 lb 15.5 oz (71.2 kg)] 156 lb 15.5 oz (71.2 kg) (11/24 0424) INTAKE / OUTPUT:  Intake/Output Summary (Last 24 hours) at 01/22/14 0808 Last data filed at 01/22/14 0700  Gross per 24 hour  Intake 1778.55 ml  Output    286 ml  Net 1492.55 ml    PHYSICAL EXAMINATION: General:  Ill appearing, in resp distress Neuro:  Normal strength, moves extremities, anxious and confused HEENT:  Dry oral mucosa, no oral lesions Cardiovascular:  Regular, tachycardic Lungs:  B/l rhonchi Abdomen:  Firm, non tender, decreased bowel sounds Musculoskeletal:  No edema Skin:  No rashes, Rt upper chest port site clean  LABS:  CBC  Recent Labs Lab 01/20/14 0535 01/21/14 0520 01/22/14 0332  WBC 37.9* 60.0* 43.9*  HGB 10.1* 10.6* 9.3*  HCT 31.2* 32.6* 28.0*  PLT 69* 124* 110*   Coag's  Recent Labs Lab 01/20/14 0535 01/21/14 0407 01/22/14 0332  INR  2.06* 3.00* 4.83*   BMET  Recent Labs Lab 01/21/14 0407 01/21/14 1600 01/22/14 0332  NA 134* 135* 133*  K 3.9 4.5 4.0  CL 105 105 100  CO2 12* 17* 18*  BUN 44* 50* 59*  CREATININE 2.13* 2.28* 2.30*  GLUCOSE 301* 174* 225*   Electrolytes  Recent Labs Lab 01/21/14 0407 01/21/14 1600 01/22/14 0332  CALCIUM 7.7* 8.3* 8.2*  MG  --   --  2.2  PHOS  --   --  4.9*   Sepsis Markers  Recent Labs Lab 01/18/14 1758 01/20/14 1035 01/22/14 0333  LATICACIDVEN 1.05 3.4* 1.0   ABG  Recent Labs Lab 01/21/14 0510 01/21/14 2305 01/22/14 0445  PHART 7.295* 7.199* 7.220*  PCO2ART 25.5* 34.7* 43.4  PO2ART 87.8 92.8 74.0*     Liver Enzymes  Recent Labs Lab 01/18/14 1751  AST 21  ALT 29  ALKPHOS 166*  BILITOT 0.7  ALBUMIN 3.5   Cardiac Enzymes  Recent Labs Lab 01/20/14 1035  PROBNP 36152.0*     Glucose  Recent Labs Lab 01/20/14 1624 01/20/14 2214 01/21/14 0751 01/21/14 1321 01/21/14 1605 01/21/14 2103  GLUCAP 234* 264* 245* 204* 148* 150*    Imaging Dg Chest Port 1 View  01/21/2014   CLINICAL DATA:  Respiratory distress  EXAM: PORTABLE CHEST - 1 VIEW  COMPARISON:  12/21/2013  FINDINGS: Right chest wall port a catheter is noted with tip in the cavoatrial junction. Stable cardiac enlargement. Bilateral pleural effusions are  identified left-greater-than-right. These appear slightly increased in volume from previous exam. Stable edema Diminished aeration of both lung bases is identified left greater night. There is mild interstitial edema. New atelectasis within the right midlung noted.  IMPRESSION: 1. Persistent bilateral pleural effusions and edema. 2. New right midlung atelectasis. 3. Little change in diminished aeration to both lung bases.   Electronically Signed   By: Kerby Moors M.D.   On: 01/21/2014 23:47   Dg Chest Port 1 View  01/21/2014   CLINICAL DATA:  Acute respiratory failure  EXAM: PORTABLE CHEST - 1 VIEW  COMPARISON:  01/20/2014  FINDINGS:  There is a right IJ porta catheter, tip at the upper right atrium.  Stable heart size and mediastinal contours.  Unchanged hazy appearance of the lower chest consistent with atelectasis and pleural effusions. No pulmonary edema or pneumothorax.  IMPRESSION: Layering pleural effusions and basilar atelectasis. There may be superimposed pneumonia, especially at the left base.   Electronically Signed   By: Jorje Guild M.D.   On: 01/21/2014 05:31   Dg Chest Port 1 View  01/20/2014   CLINICAL DATA:  Respiratory distress, shortness of breath today, history asthma, atrial fibrillation, diabetes, GERD, lymphoma  EXAM: PORTABLE CHEST - 1 VIEW  COMPARISON:  Portable exam 0957 hr compared to 01/18/2014  FINDINGS: RIGHT jugular power port with tip projecting over SVC near cavoatrial junction.  Mild enlargement of cardiac silhouette with pulmonary vascular congestion.  Atherosclerotic calcification aorta.  Cannot exclude minimal pulmonary edema.  Additional atelectasis versus consolidation in LEFT lower lobe with associated LEFT pleural effusion.  No pneumothorax.  Scattered endplate spur formation thoracic spine.  IMPRESSION: LEFT pleural effusion with atelectasis versus consolidation in LEFT lower lobe.  Enlargement of cardiac silhouette with pulmonary vascular congestion, cannot exclude minimal pulmonary edema.   Electronically Signed   By: Lavonia Dana M.D.   On: 01/20/2014 10:23      ASSESSMENT / PLAN:  PULMONARY A: Acute respiratory failure due to inability to compensate for metabolic acidosis , worsening 11/24 ? LL PNA based on presentation CXR L > R effusions Hx of OSA H of asthma. P:   Intubate and ventilate 11/24, discussed goals with family at bedside. 500 x 14, 1.00 + 5 and wean FiO2 Follow ABG, suspect we will be able to d/c bicarb gtt F/u CXR Brovana, pulmicort, with prn albuterol Continue singulair  CARDIOVASCULAR Port A:  Severe sepsis, septic shock Hx of permanent A fib on chronic  coumadin, HLD, HTN. P:  Continue IV fluids Norepi being weaned; CVC placement to guide volume if intubated ? whether her port-a -cath will be at risk due to her bacteremia. Will need serial blood cx's to insure clearance Continue digoxin Hold maxzide, dilt for now  Continue zetia, pravastatin Coumadin per pharmacy, note INR rising. No evidence bleeding but consider Vit K if increased 11/25  RENAL A:   Acute oliguric renal failure, S Cr stable at 2.3 Hypokalemia, resolved Metabolic acidosis P:   Follow UOP, BMP Replace electrolytes as needed Continue bicarb gtt for now but plan to d/c as soon as possible after MV initiated  GASTROINTESTINAL A:   Hx of GERD. 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 Lymphocytosis >> received neulasta recently as part of chemo regimen. Anemia, thrombocytopenia. P:  F/u CBC Continue allopurinol after chemo for lymphoma  INFECTIOUS A:   Severe sepsis from PNA, UTI, E coli bacteremia. Diarrhea  developed 11/21. C diff negative 11/22 P:   Vanco 11/21 >> 11/23 Cefepime 11/21 >> 11/23 Ceftriaxone 11/23 (has a PCN allergy, but did tolerate cefepime) >>   Blood cx 11/20 >> E coli Urine cx 11/20 >> E coli/klebsiella  C diff PCR 11/21 >> negative Blood cx 11/22 >>   ENDOCRINE A:   Hx of DM type II, hypothyroidism. Relative adrenal insufficiency >> received decadron with chemo. P:   SSI Hold metformin for now Continue synthroid Started solu cortef 11/22  NEUROLOGIC A:   Anxiety/Confusion P:   Sedation per PAD protocol   FAMILY    - Updates: patient, husband, daughter updated in full 11/24  - Inter-disciplinary family meet or Palliative Care meeting due by 01/27/14   TODAY'S SUMMARY:  E coli bacteremia and septic shock, ARF, acute resp failure. Will require intubation today.    Richardson Landry Minor ACNP [facility] Pager [phone] till 3 pm If no answer page  [phone] 01/22/2014, 8:09 AM  Attending Note I have examined the patient, reviewed the labs, studies, notes. I have discussed the case with S Minor and agree with the data and plans as amended above. She is poorly responsive (after some sedation to tolerate biPAP). I have recommended short-term intubation and ventilation to allow for possible recovery. Underscored with family that the underlying infection should be treatable, and that her renal fxn should improve with support. They understand and agree. Will continue ceftriaxone, intubate and place CV now.  My independent CC time 45 minutes.   Baltazar Apo, MD, PhD 01/22/2014, 12:30 PM [facility] [phone] or if no answer [phone]

## 2014-01-22 NOTE — Progress Notes (Signed)
Patty [name]   DOB:08-11-1940   NW#:[id number]   MVH#:[id number]  Patient Care Team: Horatio Pel, MD as PCP - General (Internal Medicine) Laverda Page, MD as Consulting Physician (Cardiology)  I have seen the patient, examined her and edited the notes as follows  Subjective: Events since 11/23 noted.  The patient had an episode of acute agitation with slow, abnormal respiratory pattern. Labs were consistent with metabolic acidosis, requiring critical care medicine intervention with bicarbonate, and Haldol with improvement of her respiratory status. She also received fentanyl for back pain. This morning, the patient is sedated, but does respond to touch and to voice, but is unable to interact. She is wearing BiPAP. Family is at bedside.  Scheduled Meds: . acyclovir  400 mg Oral Daily  . allopurinol  300 mg Oral Daily  . arformoterol  15 mcg Nebulization BID  . budesonide (PULMICORT) nebulizer solution  0.25 mg Nebulization BID  . cefTRIAXone (ROCEPHIN)  IV  1 g Intravenous Q24H  . cholecalciferol  2,000 Units Oral Q lunch  . digoxin  125 mcg Oral Daily  . ezetimibe  10 mg Oral Daily  . hydrocortisone sod succinate (SOLU-CORTEF) inj  50 mg Intravenous Q6H  . insulin aspart  0-15 Units Subcutaneous TID WC  . insulin aspart  0-5 Units Subcutaneous QHS  . insulin glargine  10 Units Subcutaneous Daily  . levothyroxine  250 mcg Oral QAC breakfast  . magic mouthwash w/lidocaine  5 mL Oral QID  . magnesium oxide  400 mg Oral QHS  . montelukast  10 mg Oral Daily  . omega-3 acid ethyl esters  1 g Oral BID  . potassium chloride SA  20 mEq Oral Daily  . pravastatin  20 mg Oral Once per day on Mon Tue Wed Thu  . sodium bicarbonate  650 mg Oral TID  . sodium chloride  3 mL Intravenous Q12H  . Warfarin - Pharmacist Dosing Inpatient   Does not apply q1800   Continuous Infusions: . norepinephrine (LEVOPHED) Adult infusion 3 mcg/min (01/22/14 0602)  .  sodium bicarbonate  infusion  1000 mL 100 mL/hr at 01/21/14 2354   PRN Meds:Place/Maintain arterial line **AND** sodium chloride, acetaminophen **OR** acetaminophen, albuterol, alum & mag hydroxide-simeth, fentaNYL, ondansetron **OR** ondansetron (ZOFRAN) IV, promethazine   Objective:  Filed Vitals:   01/22/14 0300  BP:   Pulse:   Temp:   Resp: 22      Intake/Output Summary (Last 24 hours) at 01/22/14 0719 Last data filed at 01/22/14 0300  Gross per 24 hour  Intake 1827.83 ml  Output    206 ml  Net 1621.83 ml    ECOG PERFORMANCE STATUS: 4  GENERAL: Critically ill appearing, patient appears sedated, but does respond to painful tactile stimuli and voice. SKIN: skin color, texture, turgor are pale, no rashes or significant lesions OROPHARYNX:no exudate, no erythema and lips, buccal mucosa, and tongue normal  NECK: supple, thyroid normal size, non-tender, without nodularity LYMPH:  no palpable lymphadenopathy in the cervical, axillary or inguinal LUNGS: Trace of rales And rhonchi at the bases anteriorily, uses accessory muscles, shallow breathing. Right port appears nontender HEART: Tachycardic,regular rate & rhythm and no murmurs, with occasional ectopic beat and no lower extremity edema ABDOMEN: abdomen soft,somewhat and normal bowel sounds Musculoskeletal:no cyanosis of digits and no clubbing  PSYCH: The patient is not alert at this time due to sedation given for agitation NEURO: no apparent focal motor/sensory deficits, The patient is unable to interact due to sedation  CBG (last 3)   Recent Labs  01/21/14 1321 01/21/14 1605 01/21/14 2103  GLUCAP 204* 148* 150*     Labs:   Recent Labs Lab 01/18/14 1751 01/19/14 0726 01/20/14 0535 01/21/14 0520 01/22/14 0332  WBC 21.9* 26.4* 37.9* 60.0* 43.9*  HGB 10.8* 9.2* 10.1* 10.6* 9.3*  HCT 33.4* 28.1* 31.2* 32.6* 28.0*  PLT 86* 67* 69* 124* 110*  MCV 84.3 84.1 85.2 84.5 83.1  MCH 27.3 27.5 27.6 27.5 27.6  MCHC 32.3 32.7 32.4 32.5 33.2  RDW  14.5 14.9 15.3 15.7* 15.9*  LYMPHSABS 0.7  --   --   --   --   MONOABS 0.9  --   --   --   --   EOSABS 0.2  --   --   --   --   BASOSABS 0.0  --   --   --   --      Chemistries:    Recent Labs Lab 01/18/14 1751 01/19/14 0726 01/20/14 1849 01/21/14 0407 01/21/14 1600 01/22/14 0332  NA 137 136* 135* 134* 135* 133*  K 3.5* 3.4* 4.2 3.9 4.5 4.0  CL 99 104 105 105 105 100  CO2 24 20 14* 12* 17* 18*  GLUCOSE 213* 207* 269* 301* 174* 225*  BUN 14 16 38* 44* 50* 59*  CREATININE 0.75 0.72 2.06* 2.13* 2.28* 2.30*  CALCIUM 9.3 7.8* 7.5* 7.7* 8.3* 8.2*  MG  --   --   --   --   --  2.2  AST 21  --   --   --   --   --   ALT 29  --   --   --   --   --   ALKPHOS 166*  --   --   --   --   --   BILITOT 0.7  --   --   --   --   --     GFR Estimated Creatinine Clearance: 21.4 mL/min (by C-G formula based on Cr of 2.3).  Liver Function Tests:  Recent Labs Lab 01/18/14 1751  AST 21  ALT 29  ALKPHOS 166*  BILITOT 0.7  PROT 6.7  ALBUMIN 3.5   No results for input(s): LIPASE, AMYLASE in the last 168 hours. No results for input(s): AMMONIA in the last 168 hours.  Urine Studies     Component Value Date/Time   COLORURINE YELLOW 01/18/2014 1754   APPEARANCEUR CLOUDY* 01/18/2014 1754   LABSPEC 1.014 01/18/2014 1754   PHURINE 7.0 01/18/2014 1754   GLUCOSEU NEGATIVE 01/18/2014 1754   HGBUR MODERATE* 01/18/2014 1754   BILIRUBINUR NEGATIVE 01/18/2014 1754   KETONESUR 15* 01/18/2014 1754   PROTEINUR 100* 01/18/2014 1754   UROBILINOGEN 1.0 01/18/2014 1754   NITRITE NEGATIVE 01/18/2014 1754   LEUKOCYTESUR SMALL* 01/18/2014 1754    Coagulation profile  Recent Labs Lab 01/18/14 1751 01/19/14 0726 01/20/14 0535 01/21/14 0407 01/22/14 0332  INR 2.20* 2.23* 2.06* 3.00* 4.83*    Cardiac Enzymes: No results for input(s): CKTOTAL, CKMB, CKMBINDEX, TROPONINI in the last 168 hours. BNP: Invalid input(s): POCBNP CBG:  Recent Labs Lab 01/20/14 2214 01/21/14 0751  01/21/14 1321 01/21/14 1605 01/21/14 2103  GLUCAP 264* 245* 204* 148* 150*     Imaging Studies:  Dg Chest Port 1 View  01/21/2014   CLINICAL DATA:  Respiratory distress  EXAM: PORTABLE CHEST - 1 VIEW  COMPARISON:  12/21/2013  FINDINGS: Right chest wall port a catheter is noted with tip in the  cavoatrial junction. Stable cardiac enlargement. Bilateral pleural effusions are identified left-greater-than-right. These appear slightly increased in volume from previous exam. Stable edema Diminished aeration of both lung bases is identified left greater night. There is mild interstitial edema. New atelectasis within the right midlung noted.  IMPRESSION: 1. Persistent bilateral pleural effusions and edema. 2. New right midlung atelectasis. 3. Little change in diminished aeration to both lung bases.   Electronically Signed   By: Kerby Moors M.D.   On: 01/21/2014 23:47   Dg Chest Port 1 View  01/21/2014   CLINICAL DATA:  Acute respiratory failure  EXAM: PORTABLE CHEST - 1 VIEW  COMPARISON:  01/20/2014  FINDINGS: There is a right IJ porta catheter, tip at the upper right atrium.  Stable heart size and mediastinal contours.  Unchanged hazy appearance of the lower chest consistent with atelectasis and pleural effusions. No pulmonary edema or pneumothorax.  IMPRESSION: Layering pleural effusions and basilar atelectasis. There may be superimposed pneumonia, especially at the left base.   Electronically Signed   By: Jorje Guild M.D.   On: 01/21/2014 05:31   Dg Chest Port 1 View  01/20/2014   CLINICAL DATA:  Respiratory distress, shortness of breath today, history asthma, atrial fibrillation, diabetes, GERD, lymphoma  EXAM: PORTABLE CHEST - 1 VIEW  COMPARISON:  Portable exam 0957 hr compared to 01/18/2014  FINDINGS: RIGHT jugular power port with tip projecting over SVC near cavoatrial junction.  Mild enlargement of cardiac silhouette with pulmonary vascular congestion.  Atherosclerotic calcification aorta.   Cannot exclude minimal pulmonary edema.  Additional atelectasis versus consolidation in LEFT lower lobe with associated LEFT pleural effusion.  No pneumothorax.  Scattered endplate spur formation thoracic spine.  IMPRESSION: LEFT pleural effusion with atelectasis versus consolidation in LEFT lower lobe.  Enlargement of cardiac silhouette with pulmonary vascular congestion, cannot exclude minimal pulmonary edema.   Electronically Signed   By: Lavonia Dana M.D.   On: 01/20/2014 10:23    Recent Labs Lab 01/18/14 1751 01/19/14 0726 01/20/14 0535 01/21/14 0520 01/22/14 0332  WBC 21.9* 26.4* 37.9* 60.0* 43.9*  HGB 10.8* 9.2* 10.1* 10.6* 9.3*  HCT 33.4* 28.1* 31.2* 32.6* 28.0*  PLT 86* 67* 69* 124* 110*  MCV 84.3 84.1 85.2 84.5 83.1  MCH 27.3 27.5 27.6 27.5 27.6  MCHC 32.3 32.7 32.4 32.5 33.2  RDW 14.5 14.9 15.3 15.7* 15.9*  LYMPHSABS 0.7  --   --   --   --   MONOABS 0.9  --   --   --   --   EOSABS 0.2  --   --   --   --   BASOSABS 0.0  --   --   --   --    Assessment/Plan: 73 y.o.  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 status post cycle 1 of chemotherapy with bendamustine and rituximab on 01/09/2014-bendamustine on day 1 and 2 with rituximab on day 1 and q 28 days, for planned 6 cycles for curative intent Neulasta support was given on 11/13. Continue aggressive supportive care.  Metabolic acidosis, with confusion and agitation The patient received IV bicarbonate,haldol  Appreciate clinic care medicine involvement May need intubation for airway protection  Atrial fibrillation Her most recent echocardiogram show preserved ejection fraction. On Cardizem, Digoxin And Coumadin Recommend close follow-up and INR monitoring as INR supratherapeutic today. Pharmacy is following  Anemia Thrombocytopenia This is likely anemia of chronic disease and recent chemotherapy No bleeding issues  reported overnight such as epistaxis,  hematuria or hematochezia.  She does not require transfusion now unless hemoglobin less than 8 g, and platelets less than 10,000 or  20,000 if acutely bleeding  Leukocytosis In the setting of Lymphoma, recent Neulasta on 11/13,Steroids, Inflammation, fever, and dyspnea This is trending down to normal  At high risk of tumor lysis syndrome Continue fluid hydration and allopurinol  Abdominal distention  Diarrhea May be due to recent chemotherapy C. difficile is negative Supportive care with antiemetics and antidiarrheals are recommended   Escherichia coli in urine with renal failure On Rocephin IV per primary team  DVT prophylaxis On Coumadin.Consider holding the dose today by pharmacy as INR is supratherapeutic  Full Code Other medical issues as per admitting team Prognosis guarded. On maximum supportive care     **Disclaimer: This note was dictated with voice recognition software. Similar sounding words can inadvertently be transcribed and this note may contain transcription errors which may not have been corrected upon publication of note.Sharene Butters E, PA-C 01/22/2014  7:19 AM Dorin Stooksbury, MD 01/22/2014

## 2014-01-22 NOTE — Progress Notes (Signed)
[facility] Progress Note Patient Name: Patty [name] DOB: 04/24/1940 MRN: [id number]   Date of Service  01/22/2014  HPI/Events of Note  PH improved to 7.22 on IV bicarb  eICU Interventions  Plan: Continue IV bicarb Give additional bicarb bolus Start oral bicarb Recheck ABG     Intervention Category Major Interventions: Acid-Base disturbance - evaluation and management  [name] 01/22/2014, 5:00 AM

## 2014-01-22 NOTE — Progress Notes (Signed)
Inpatient Diabetes Program Recommendations  AACE/ADA: New Consensus Statement on Inpatient Glycemic Control (2013)  Target Ranges:  Prepandial:   less than 140 mg/dL      Peak postprandial:   less than 180 mg/dL (1-2 hours)      Critically ill patients:  140 - 180 mg/dL     Results for GWENDLOYN, FORSEE (MRN [id number]) as of 01/22/2014 13:35  Ref. Range 01/21/2014 07:51 01/21/2014 13:21 01/21/2014 16:05 01/21/2014 21:03  Glucose-Capillary Latest Range: 70-99 mg/dL 245 (H) 204 (H) 148 (H) 150 (H)    Results for JAQUANA, GEIGER (MRN [id number]) as of 01/22/2014 13:35  Ref. Range 01/22/2014 08:46 01/22/2014 12:01  Glucose-Capillary Latest Range: 70-99 mg/dL 219 (H) 229 (H)     Patient intubated this AM.  Glucose levels elevated.    Patient currently receiving IV Solucortef 50 mg Q6 hours.  Current Insulin Orders= Lantus 10 units daily       Novolog Resistant SSI tid ac + HS    MD- Please consider the following insulin adjustments:  1. Increase Lantus dose by 20% to Lantus 12 units daily 2. Please change Novolog SSI coverage to Q4 hours since patient now intubated    Will follow Wyn Quaker RN, MSN, CDE Diabetes Coordinator Inpatient Diabetes Program Team Pager: [phone] (8a-10p)

## 2014-01-23 ENCOUNTER — Inpatient Hospital Stay (HOSPITAL_COMMUNITY): Payer: Medicare Other

## 2014-01-23 DIAGNOSIS — C778 Secondary and unspecified malignant neoplasm of lymph nodes of multiple regions: Secondary | ICD-10-CM

## 2014-01-23 DIAGNOSIS — N390 Urinary tract infection, site not specified: Secondary | ICD-10-CM

## 2014-01-23 DIAGNOSIS — B9620 Unspecified Escherichia coli [E. coli] as the cause of diseases classified elsewhere: Secondary | ICD-10-CM

## 2014-01-23 DIAGNOSIS — D72829 Elevated white blood cell count, unspecified: Secondary | ICD-10-CM

## 2014-01-23 DIAGNOSIS — D6959 Other secondary thrombocytopenia: Secondary | ICD-10-CM

## 2014-01-23 DIAGNOSIS — D638 Anemia in other chronic diseases classified elsewhere: Secondary | ICD-10-CM

## 2014-01-23 DIAGNOSIS — I2510 Atherosclerotic heart disease of native coronary artery without angina pectoris: Secondary | ICD-10-CM

## 2014-01-23 LAB — BASIC METABOLIC PANEL
ANION GAP: 14 (ref 5–15)
BUN: 62 mg/dL — ABNORMAL HIGH (ref 6–23)
CALCIUM: 8 mg/dL — AB (ref 8.4–10.5)
CO2: 25 mEq/L (ref 19–32)
CREATININE: 2.01 mg/dL — AB (ref 0.50–1.10)
Chloride: 99 mEq/L (ref 96–112)
GFR calc Af Amer: 27 mL/min — ABNORMAL LOW (ref >90)
GFR calc non Af Amer: 23 mL/min — ABNORMAL LOW (ref >90)
GLUCOSE: 228 mg/dL — AB (ref 70–99)
Potassium: 3.4 mEq/L — ABNORMAL LOW (ref 3.7–5.3)
SODIUM: 138 meq/L (ref 137–147)

## 2014-01-23 LAB — TROPONIN I
Troponin I: <0.3 ng/mL (ref <0.30)
Troponin I: <0.3 ng/mL (ref <0.30)

## 2014-01-23 LAB — CBC
HCT: 26 % — ABNORMAL LOW (ref 36.0–46.0)
HEMOGLOBIN: 8.9 g/dL — AB (ref 12.0–15.0)
MCH: 27.7 pg (ref 26.0–34.0)
MCHC: 34.2 g/dL (ref 30.0–36.0)
MCV: 81 fL (ref 78.0–100.0)
PLATELETS: 100 10*3/uL — AB (ref 150–400)
RBC: 3.21 MIL/uL — AB (ref 3.87–5.11)
RDW: 15.5 % (ref 11.5–15.5)
WBC: 24 10*3/uL — AB (ref 4.0–10.5)

## 2014-01-23 LAB — PROCALCITONIN: Procalcitonin: 10.93 ng/mL

## 2014-01-23 LAB — MAGNESIUM: MAGNESIUM: 2.1 mg/dL (ref 1.5–2.5)

## 2014-01-23 LAB — GLUCOSE, CAPILLARY
Glucose-Capillary: 220 mg/dL — ABNORMAL HIGH (ref 70–99)
Glucose-Capillary: 222 mg/dL — ABNORMAL HIGH (ref 70–99)
Glucose-Capillary: 205 mg/dL — ABNORMAL HIGH (ref 70–99)
Glucose-Capillary: 210 mg/dL — ABNORMAL HIGH (ref 70–99)

## 2014-01-23 LAB — PROTIME-INR
INR: 4.03 — ABNORMAL HIGH (ref 0.00–1.49)
Prothrombin Time: 39.5 seconds — ABNORMAL HIGH (ref 11.6–15.2)

## 2014-01-23 LAB — DIGOXIN LEVEL: Digoxin Level: 1 ng/mL (ref 0.8–2.0)

## 2014-01-23 MED ORDER — PANTOPRAZOLE SODIUM 40 MG IV SOLR
40.0000 mg | INTRAVENOUS | Status: DC
  Administered 2014-01-23 – 2014-01-28 (×6): 40 mg via INTRAVENOUS
  Filled 2014-01-23 (×6): qty 40

## 2014-01-23 MED ORDER — VITAMINS A & D EX OINT
TOPICAL_OINTMENT | CUTANEOUS | Status: AC
  Administered 2014-01-23: 20:00:00
  Filled 2014-01-23: qty 10

## 2014-01-23 MED ORDER — SODIUM CHLORIDE 0.9 % IV SOLN
INTRAVENOUS | Status: DC
  Administered 2014-01-23 – 2014-01-24 (×3): via INTRAVENOUS
  Administered 2014-01-25: 10 mL/h via INTRAVENOUS
  Administered 2014-01-27: 1000 mL via INTRAVENOUS

## 2014-01-23 MED ORDER — INSULIN ASPART 100 UNIT/ML ~~LOC~~ SOLN
2.0000 [IU] | SUBCUTANEOUS | Status: DC
  Administered 2014-01-23: 7 [IU] via SUBCUTANEOUS
  Administered 2014-01-23: 2 [IU] via SUBCUTANEOUS
  Administered 2014-01-23: 4 [IU] via SUBCUTANEOUS
  Administered 2014-01-23 – 2014-01-25 (×9): 2 [IU] via SUBCUTANEOUS
  Administered 2014-01-26 – 2014-01-27 (×4): 4 [IU] via SUBCUTANEOUS
  Administered 2014-01-27: 2 [IU] via SUBCUTANEOUS
  Administered 2014-01-28: 4 [IU] via SUBCUTANEOUS

## 2014-01-23 NOTE — Plan of Care (Signed)
Problem: Phase I Progression Outcomes Goal: Dyspnea controlled at rest Outcome: Not Progressing Intubated 01/22/2014

## 2014-01-23 NOTE — Progress Notes (Signed)
Echocardiogram 2D Echocardiogram has been performed.  Joelene Millin 01/23/2014, 9:49 AM

## 2014-01-23 NOTE — Progress Notes (Signed)
PULMONARY / CRITICAL CARE MEDICINE   Name: Patty [name] MRN: [id number] DOB: 1940/05/30    ADMISSION DATE:  01/18/2014 CONSULTATION DATE:  01/20/2014  REFERRING MD : Dr. Rockne Menghini  CHIEF COMPLAINT: Confusion  INITIAL PRESENTATION:  73 yo female presented with altered mental status, nausea, fever (105.1), ? LLL infiltrate on CXR. Dx w E coli bacteremia. She has hx of follicular lymphoma (first chemo on 01/09/14), A fib on coumadin, HTN, HLD, Hypothyroidism, DM.  STUDIES:  11/06 Echo >> EF 55 to 60%  SIGNIFICANT EVENTS: 11/20 Admit 11/22 Transfer to SDU with hypotension 11/23 Started on BiPAP for increased WOB in setting progressive metabolic acidosis.  11/24 worsening resp distress. 11/24 Intubated; ST depressions noted ECG, troponin negative  SUBJECTIVE:  Tolerated intubation UOP has improved last 24h   VITAL SIGNS: Temp:  [97.3 F (36.3 C)-98.2 F (36.8 C)] 97.8 F (36.6 C) (11/25 0000) Pulse Rate:  [67-85] 77 (11/25 0630) Resp:  [8-24] 14 (11/25 0630) BP: (90-129)/(48-61) 115/54 mmHg (11/25 0418) SpO2:  [97 %-100 %] 97 % (11/25 0630) Arterial Line BP: (81-156)/(40-70) 156/70 mmHg (11/25 0630) FiO2 (%):  [30 %-50 %] 30 % (11/25 0418) Weight:  [73.8 kg (162 lb 11.2 oz)] 73.8 kg (162 lb 11.2 oz) (11/25 0330) INTAKE / OUTPUT:  Intake/Output Summary (Last 24 hours) at 01/23/14 0904 Last data filed at 01/23/14 0600  Gross per 24 hour  Intake 2586.4 ml  Output    920 ml  Net 1666.4 ml    PHYSICAL EXAMINATION: General:  Ill appearing, sedated Neuro:   HEENT:  Dry oral mucosa, no oral lesions Cardiovascular:  Regular, tachycardic Lungs:  B/l rhonchi Abdomen:  Firm, non tender, decreased bowel sounds Musculoskeletal:  No edema Skin:  No rashes, Rt upper chest port site clean  LABS:  CBC  Recent Labs Lab 01/21/14 0520 01/22/14 0332 01/23/14 0410  WBC 60.0* 43.9* 24.0*  HGB 10.6* 9.3* 8.9*  HCT 32.6* 28.0* 26.0*  PLT 124* 110* 100*   Coag's  Recent  Labs Lab 01/21/14 0407 01/22/14 0332 01/23/14 0410  INR 3.00* 4.83* 4.03*   BMET  Recent Labs Lab 01/21/14 1600 01/22/14 0332 01/23/14 0410  NA 135* 133* 138  K 4.5 4.0 3.4*  CL 105 100 99  CO2 17* 18* 25  BUN 50* 59* 62*  CREATININE 2.28* 2.30* 2.01*  GLUCOSE 174* 225* 228*   Electrolytes  Recent Labs Lab 01/21/14 1600 01/22/14 0332 01/23/14 0410  CALCIUM 8.3* 8.2* 8.0*  MG  --  2.2 2.1  PHOS  --  4.9*  --    Sepsis Markers  Recent Labs Lab 01/18/14 1758 01/20/14 1035 01/22/14 0333 01/22/14 1730 01/23/14 0410  LATICACIDVEN 1.05 3.4* 1.0  --   --   PROCALCITON  --   --   --  17.55 10.93   ABG  Recent Labs Lab 01/21/14 2305 01/22/14 0445 01/22/14 1315  PHART 7.199* 7.220* 7.340*  PCO2ART 34.7* 43.4 41.1  PO2ART 92.8 74.0* 111.0*     Liver Enzymes  Recent Labs Lab 01/18/14 1751  AST 21  ALT 29  ALKPHOS 166*  BILITOT 0.7  ALBUMIN 3.5   Cardiac Enzymes  Recent Labs Lab 01/20/14 1035 01/22/14 1730 01/23/14 0010  TROPONINI  --  <0.30 <0.30  PROBNP 36152.0*  --   --      Glucose  Recent Labs Lab 01/21/14 1321 01/21/14 1605 01/21/14 2103 01/22/14 0846 01/22/14 1201 01/22/14 1522  GLUCAP 204* 148* 150* 219* 229* 237*    Imaging  Dg Chest Port 1 View  01/23/2014   CLINICAL DATA:  Respiratory failure with hypoxia  EXAM: PORTABLE CHEST - 1 VIEW  COMPARISON:  01/22/2014  FINDINGS: Chronic cardiomegaly. There is pulmonary edema which is similar to yesterday. Haziness of the lower chest consistent with pleural effusions. No pneumothorax.  New orogastric tube which reaches the stomach. Endotracheal tube tip ends just below the clavicular heads. Right porta catheter and left IJ central line remain in good position.  IMPRESSION: New orogastric tube is in good position.  Bibasilar airspace disease with pulmonary edema and pleural effusions.   Electronically Signed   By: Jorje Guild M.D.   On: 01/23/2014 04:36   Dg Chest Port 1  View  01/22/2014   CLINICAL DATA:  Hypoxia  EXAM: PORTABLE CHEST - 1 VIEW  COMPARISON:  January 21, 2014  FINDINGS: Endotracheal tube tip is 5.8 cm above the carina. Left jugular catheter tip is in the superior vena cava just beyond the junction with the left innominate vein. Port-A-Cath tip is in the distal superior vena cava near the junction with the atrium. No pneumothorax.  There are bilateral effusions with mild interstitial edema. There is consolidation throughout much of the left lower lobe. Heart is mildly enlarged with pulmonary vascularity within normal limits.  IMPRESSION: Tube and catheter positions as described without pneumothorax. Left lower lobe consolidation. Suspect pneumonia. Findings felt to be indicative of a degree of underlying congestive heart failure as well.   Electronically Signed   By: Lowella Grip M.D.   On: 01/22/2014 12:47   Dg Chest Port 1 View  01/21/2014   CLINICAL DATA:  Respiratory distress  EXAM: PORTABLE CHEST - 1 VIEW  COMPARISON:  12/21/2013  FINDINGS: Right chest wall port a catheter is noted with tip in the cavoatrial junction. Stable cardiac enlargement. Bilateral pleural effusions are identified left-greater-than-right. These appear slightly increased in volume from previous exam. Stable edema Diminished aeration of both lung bases is identified left greater night. There is mild interstitial edema. New atelectasis within the right midlung noted.  IMPRESSION: 1. Persistent bilateral pleural effusions and edema. 2. New right midlung atelectasis. 3. Little change in diminished aeration to both lung bases.   Electronically Signed   By: Kerby Moors M.D.   On: 01/21/2014 23:47      ASSESSMENT / PLAN:  PULMONARY A: Acute respiratory failure due to inability to compensate for metabolic acidosis , worsening 11/24 ? LL PNA based on presentation CXR L > R effusions Hx of OSA H of asthma. P:   Intubate and ventilate 11/24, discussed goals with family at  bedside. 500 x 14, 1.00 + 5 and wean FiO2 Follow ABG, suspect we will be able to d/c bicarb gtt F/u CXR Brovana, pulmicort, with prn albuterol Continue singulair Assess for pleural effusions, may need diagnostic thora  CARDIOVASCULAR Port A:  Severe sepsis, septic shock Hx of permanent A fib on chronic coumadin, HLD, HTN. ST depression 11/24 > initial troponin negative P:  Follow serial troponin Norepi being weaned ? whether her port-a -cath will be at risk due to her bacteremia. Following serial blood cx's to insure clearance Continue digoxin, check level given renal insufficiency Hold maxzide, dilt for now  Continue zetia, pravastatin Coumadin per pharmacy, note INR rising. No evidence bleeding but consider Vit K; decreasing on its own on 11/25  RENAL A:   Acute oliguric renal failure, S Cr and UOP starting to improve 11/25 Hypokalemia, resolved Metabolic acidosis P:   Follow  UOP, BMP Replace electrolytes as needed D/c bicarb gtt 11/25  GASTROINTESTINAL A:   Hx of GERD. 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 Lymphocytosis >> received neulasta recently as part of chemo regimen. Anemia, thrombocytopenia. P:  F/u CBC Continue allopurinol after chemo for lymphoma  INFECTIOUS A:   Severe sepsis from PNA, UTI, E coli bacteremia. Diarrhea developed 11/21. C diff negative 11/22 P:   Vanco 11/21 >> 11/23 Cefepime 11/21 >> 11/23 Ceftriaxone 11/23 (has a PCN allergy, but did tolerate cefepime) >>   Blood cx 11/20 >> E coli Urine cx 11/20 >> E coli/klebsiella  C diff PCR 11/21 >> negative Blood cx 11/22 >>   ENDOCRINE A:   Hx of DM type II, hypothyroidism. Relative adrenal insufficiency >> received decadron with chemo. P:   SSI Hold metformin for now Continue synthroid Started solu cortef 11/22  NEUROLOGIC A:   Anxiety/Confusion P:   Sedation per PAD protocol   FAMILY   - Updates: patient,  husband, daughter updated in full 11/25  - Inter-disciplinary family meet or Palliative Care meeting due by 01/27/14   TODAY'S SUMMARY:  E coli bacteremia and septic shock, ARF, acute resp failure.    My independent CC time 45 minutes.   Baltazar Apo, MD, PhD 01/23/2014, 9:04 AM [facility] [phone] or if no answer [phone]

## 2014-01-23 NOTE — Progress Notes (Signed)
INITIAL NUTRITION ASSESSMENT  DOCUMENTATION CODES Per approved criteria  -Not Applicable   INTERVENTION: -AS WARRANTED: Initiate Vital AF 1.2 @ 20 ml/hr via OGT and increase by 10 ml every 4 hours to goal rate of 55 ml/hr.  -Tube feeding regimen provides 1584 kcal (97% of needs), 99 grams of protein (100% est protein needs), and 1070 ml of H2O.  -Adult enteral protocol -RD to continue to monitor  NUTRITION DIAGNOSIS: Inadequate oral intake related to inability to eat as evidenced by NPO status.   Goal: TF to meet >/= 90% of their estimated nutrition needs    Monitor:  TF order/tolerance, total protein/energy intake, labs, weights  Reason for Assessment: New Vent  73 y.o. female  Admitting Dx: Severe sepsis with septic shock  ASSESSMENT: Patty [name] is a 73 y.o. 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  -Pt evaluated by oncology RD in 01/10/2014. Noted pt to have good appetite, nausea but no vomiting. Usual body weight around 166 lbs, and had experienced intentional weight loss 2/2 DM2 dx. Was educated on small frequent meals with high protein foods -MD noted pt with nausea and diarrhea upon admit in 11/20. Pt was C.diff negative and diarrhea resolving.  -Prior to intubation, pt consuming 25% of Heart healthy diet -Pt with acute renal failure, phos elevated. K low, being repleted. Mg WNL -CBGs elevated, > 200 mg/dL, likely r/t DM2 and inflammatory response -Pt intubated on 11/24 -Family denied significant changes in appetite; noted that pt was trying to eat 5 times per day as recommended by oncology RD -Per discussion with RN, no current plans for tube feeding. Will place recommendations to use as warranted -Patient is currently intubated on ventilator support MV: 7.4 L/min Temp  (24hrs), Avg:98.1 F (36.7 C), Min:97.3 F (36.3 C), Max:98.9 F (37.2 C)     Height: Ht Readings from Last 1 Encounters:  01/20/14 5\' 6"  (1.676 m)    Weight: Wt Readings from Last 1 Encounters:  01/23/14 162 lb 11.2 oz (73.8 kg)    Ideal Body Weight: 130 lb  % Ideal Body Weight: 125%  Wt Readings from Last 10 Encounters:  01/23/14 162 lb 11.2 oz (73.8 kg)  01/10/14 138 lb (62.596 kg)  01/08/14 137 lb 3.2 oz (62.234 kg)  12/28/13 137 lb 9.6 oz (62.415 kg)  12/26/13 137 lb (62.143 kg)  12/07/13 134 lb (60.782 kg)  11/26/13 134 lb 6 oz (60.952 kg)  01/08/13 139 lb (63.05 kg)  12/15/11 143 lb (64.864 kg)  04/13/11 156 lb (70.761 kg)    Usual Body Weight: 166 lb  % Usual Body Weight: 98%  BMI:  Body mass index is 26.27 kg/(m^2).  Estimated Nutritional Needs: Kcal: 1626 Protein: 80-95 gram Fluid: >/= 1800 ml daily  Skin: WDL  Diet Order: Diet NPO time specified  EDUCATION NEEDS: -No education needs identified at this time   Intake/Output Summary (Last 24 hours) at 01/23/14 0948 Last data filed at 01/23/14 0600  Gross per 24 hour  Intake 2586.4 ml  Output    920 ml  Net 1666.4 ml    Last BM: 11/23   Labs:   Recent Labs Lab 01/21/14 1600 01/22/14 0332 01/23/14 0410  NA 135* 133* 138  K 4.5 4.0 3.4*  CL 105 100 99  CO2 17* 18*  25  BUN 50* 59* 62*  CREATININE 2.28* 2.30* 2.01*  CALCIUM 8.3* 8.2* 8.0*  MG  --  2.2 2.1  PHOS  --  4.9*  --   GLUCOSE 174* 225* 228*    CBG (last 3)   Recent Labs  01/22/14 0846 01/22/14 1201 01/22/14 1522  GLUCAP 219* 229* 237*    Scheduled Meds: . acyclovir  400 mg Oral Daily  . allopurinol  300 mg Oral Daily  . antiseptic oral rinse  7 mL Mouth Rinse QID  . arformoterol  15 mcg Nebulization BID  . budesonide (PULMICORT) nebulizer solution  0.25 mg Nebulization BID  . cefTRIAXone (ROCEPHIN)  IV  1 g Intravenous Q24H  . chlorhexidine  15 mL Mouth Rinse BID  . cholecalciferol  2,000 Units Oral Q  lunch  . digoxin  125 mcg Oral Daily  . ezetimibe  10 mg Oral Daily  . furosemide  40 mg Intravenous Q12H  . hydrocortisone sod succinate (SOLU-CORTEF) inj  50 mg Intravenous Q6H  . insulin aspart  2-6 Units Subcutaneous 6 times per day  . insulin glargine  10 Units Subcutaneous Daily  . levothyroxine  250 mcg Oral QAC breakfast  . magic mouthwash w/lidocaine  5 mL Oral QID  . magnesium oxide  400 mg Oral QHS  . montelukast  10 mg Oral Daily  . omega-3 acid ethyl esters  1 g Oral BID  . pantoprazole (PROTONIX) IV  40 mg Intravenous Q24H  . potassium chloride SA  20 mEq Oral Daily  . pravastatin  20 mg Oral Once per day on Mon Tue Wed Thu  . sodium chloride  3 mL Intravenous Q12H  . Warfarin - Pharmacist Dosing Inpatient   Does not apply q1800    Continuous Infusions: . dexmedetomidine 0.5 mcg/kg/hr (01/23/14 0600)  . norepinephrine (LEVOPHED) Adult infusion 10 mcg/min (01/23/14 0174)    Past Medical History  Diagnosis Date  . Asthma 06/15/2010  . Hypothyroidism 06/15/2010  . Hyperlipemia 06/15/2010  . Atrial fibrillation 05/28/2010    Managed with rate control and coumadin  . Long-term (current) use of anticoagulants   . ACE-inhibitor cough   . DI (detrusor instability)   . Fibroid   . Rectocele   . Atrophic vaginitis   . 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 Diabetes mellitus     Type 2  . Peripheral neuropathy   . Hypertension   . Dysrhythmia     cardioversion - 2012  . Complication of anesthesia 1980's    post anesth.- states she "went into resp. arrest" , but then remarked that she thought maybe they gave her too much medicine (morphine)   . Shortness of breath     sometimes   . 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 Anemia   . Malignant lymphoma, follicular 94/4/96  . CPAP (continuous positive airway pressure) dependence   . GERD (gastroesophageal reflux disease)   . Hearing loss   . Sleep apnea     uses CPAP everynight- last study in [place] 5 yrs. or  more   . Severe sepsis with septic shock 01/21/2014  . E coli bacteremia 01/20/2014    Past Surgical History  Procedure Laterality Date  . Appendectomy  1953  . Partial hysterectomy  1972  . Cholecystectomy  1993  . Back surgery      x2  . Cataract extraction    . Cardiac catheterization  10/10/2008    Nonobstructive CAD  .  Abdominal hysterectomy  1972    TAH  . Foot surgery    . Tonsillectomy    . Pubovag repair with sling    . Skin tag removal  2012    leg  . Cardioversion  04/13/2011    Procedure: CARDIOVERSION;  Surgeon: Laverda Page, MD;  Location: [facility];  Service: Cardiovascular;  Laterality: N/A;  . Eye surgery      /w IOL, post cataracts removed   . Mass biopsy Left 12/07/2013    Procedure: EXCISIONAL BIOPSY LEFT NECK MASS ;  Surgeon: Jerrell Belfast, MD;  Location: [facility];  Service: ENT;  Laterality: Left;    Atlee Abide MS RD LDN Clinical Dietitian [phone]

## 2014-01-23 NOTE — Progress Notes (Signed)
ANTICOAGULATION CONSULT NOTE - Follow Up  Pharmacy Consult for Warfarin Indication: atrial fibrillation  Allergies  Allergen Reactions  . Morphine And Related Anaphylaxis and Other (See Comments)    Pt states she stopped breathing post op- "went into resp. arrest"  . Demerol Nausea And Vomiting  . Meperidine Hcl Nausea And Vomiting  . Multaq [Dronedarone] Other (See Comments)    Reaction:  Blood in urine and elevated liver enzymes.   Jeanie Cooks Allergy] Rash  . Penicillins Itching and Rash  . Sulfa Drugs Cross Reactors Rash   Patient Measurements: Height: 5\' 6"  (167.6 cm) Weight: 162 lb 11.2 oz (73.8 kg) IBW/kg (Calculated) : 59.3  Vital Signs: Temp: 98.9 F (37.2 C) (11/25 0800) Temp Source: Axillary (11/25 0800) BP: 115/54 mmHg (11/25 0418) Pulse Rate: 90 (11/25 0800)  Labs:  Recent Labs  01/21/14 0407  01/21/14 0520 01/21/14 1600 01/22/14 0332 01/22/14 1730 01/23/14 0010 01/23/14 0410 01/23/14 0828  HGB  --   < > 10.6*  --  9.3*  --   --  8.9*  --   HCT  --   --  32.6*  --  28.0*  --   --  26.0*  --   PLT  --   --  124*  --  110*  --   --  100*  --   LABPROT 31.4*  --   --   --  45.5*  --   --  39.5*  --   INR 3.00*  --   --   --  4.83*  --   --  4.03*  --   CREATININE 2.13*  --   --  2.28* 2.30*  --   --  2.01*  --   TROPONINI  --   --   --   --   --  <0.30 <0.30  --  <0.30  < > = values in this interval not displayed. Estimated Creatinine Clearance: 24.9 mL/min (by C-G formula based on Cr of 2.01).  Medical History: Past Medical History  Diagnosis Date  . Asthma 06/15/2010  . Hypothyroidism 06/15/2010  . Hyperlipemia 06/15/2010  . Atrial fibrillation 05/28/2010    Managed with rate control and coumadin  . Long-term (current) use of anticoagulants   . ACE-inhibitor cough   . DI (detrusor instability)   . Fibroid   . Rectocele   . Atrophic vaginitis   . 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 Diabetes mellitus     Type 2  . Peripheral  neuropathy   . Hypertension   . Dysrhythmia     cardioversion - 2012  . Complication of anesthesia 1980's    post anesth.- states she "went into resp. arrest" , but then remarked that she thought maybe they gave her too much medicine (morphine)   . Shortness of breath     sometimes   . 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 Anemia   . Malignant lymphoma, follicular 63/3/35  . CPAP (continuous positive airway pressure) dependence   . GERD (gastroesophageal reflux disease)   . Hearing loss   . Sleep apnea     uses CPAP everynight- last study in [place] 5 yrs. or more   . Severe sepsis with septic shock 01/21/2014  . E coli bacteremia 01/20/2014   Medications:  Scheduled:  . acyclovir  400 mg Oral Daily  . allopurinol  300 mg Oral Daily  . antiseptic oral rinse  7 mL Mouth Rinse  QID  . arformoterol  15 mcg Nebulization BID  . budesonide (PULMICORT) nebulizer solution  0.25 mg Nebulization BID  . cefTRIAXone (ROCEPHIN)  IV  1 g Intravenous Q24H  . chlorhexidine  15 mL Mouth Rinse BID  . cholecalciferol  2,000 Units Oral Q lunch  . digoxin  125 mcg Oral Daily  . ezetimibe  10 mg Oral Daily  . furosemide  40 mg Intravenous Q12H  . hydrocortisone sod succinate (SOLU-CORTEF) inj  50 mg Intravenous Q6H  . insulin aspart  2-6 Units Subcutaneous 6 times per day  . insulin glargine  10 Units Subcutaneous Daily  . levothyroxine  250 mcg Oral QAC breakfast  . magic mouthwash w/lidocaine  5 mL Oral QID  . magnesium oxide  400 mg Oral QHS  . montelukast  10 mg Oral Daily  . omega-3 acid ethyl esters  1 g Oral BID  . pantoprazole (PROTONIX) IV  40 mg Intravenous Q24H  . potassium chloride SA  20 mEq Oral Daily  . pravastatin  20 mg Oral Once per day on Mon Tue Wed Thu  . sodium chloride  3 mL Intravenous Q12H  . Warfarin - Pharmacist Dosing Inpatient   Does not apply q1800  Inpatient warfarin doses administered from 11/20: 5mg  at home prior to admission, 2.5mg , 5mg , none,  none  Assessment: 51 yoF admitted with sepsis secondary to HCAP following first course of chemotherapy for NHL. On chronic Warfarin for Afib: home dose 2.5mg  on Wed, Sat with 5mg  other days, last dose 11/20 (5mg  - night of admission). Admit INR 2.20, in therapeutic range.    Goal of Therapy:  INR 2-3  Today, 11/25:  INR 4.03 (from 4.83 yesterday) decreasing  Pltc 100K post-chemotherapy, decreasing from high 124K on 11/23  Hgb low,stable post-chemotherapy.  No bleeding reported  On cardiac diet-poor po intake  Received Metronidazole x 3 doses 11/22-11/23 (too early to see effect on INR)  1. No Warfarin today 2. Follow daily PT/INR 3. Follow CBC - following pltc post-chemotherapy 4. Follow clinical course and any reports of bleeding.  Minda Ditto PharmD Pager [phone] 01/23/2014, 10:57 AM

## 2014-01-23 NOTE — Procedures (Signed)
2 RT's present for arterial line change.Site cleaned (appears to be WNL) and redressed per Guideline- uneventful. Line flushes and draws well, with good waveform.RN aware.

## 2014-01-23 NOTE — Plan of Care (Signed)
Problem: Consults Goal: Ventilated Patients Patient Education See Patient Education Module for education specifics. Outcome: Progressing  Problem: Phase I Progression Outcomes Goal: VTE prophylaxis Outcome: Completed/Met Date Met:  01/23/14 Goal: GIProphysixis Outcome: Completed/Met Date Met:  01/23/14 Goal: HOB elevated 30 degrees Outcome: Completed/Met Date Met:  01/23/14 Goal: VAP prevention protocol initiated Outcome: Progressing Goal: Sedation Protocol initiated if indicated Outcome: Progressing

## 2014-01-23 NOTE — Progress Notes (Signed)
Patty [name]   DOB:1940/05/28   EX#:[id number]   LFY#:[id number]  Patient Care Team: Horatio Pel, MD as PCP - General (Internal Medicine) Laverda Page, MD as Consulting Physician (Cardiology)  I have seen the patient, examined her and edited the notes as follows  Subjective: Events since 11/24 noted. She is intubated and sedated due to respiratory insufficiency,unable to interact. No family is at bedside.  Scheduled Meds: . acyclovir  400 mg Oral Daily  . allopurinol  300 mg Oral Daily  . antiseptic oral rinse  7 mL Mouth Rinse QID  . arformoterol  15 mcg Nebulization BID  . budesonide (PULMICORT) nebulizer solution  0.25 mg Nebulization BID  . cefTRIAXone (ROCEPHIN)  IV  1 g Intravenous Q24H  . chlorhexidine  15 mL Mouth Rinse BID  . cholecalciferol  2,000 Units Oral Q lunch  . digoxin  125 mcg Oral Daily  . ezetimibe  10 mg Oral Daily  . furosemide  40 mg Intravenous Q12H  . hydrocortisone sod succinate (SOLU-CORTEF) inj  50 mg Intravenous Q6H  . insulin glargine  10 Units Subcutaneous Daily  . levothyroxine  250 mcg Oral QAC breakfast  . magic mouthwash w/lidocaine  5 mL Oral QID  . magnesium oxide  400 mg Oral QHS  . montelukast  10 mg Oral Daily  . omega-3 acid ethyl esters  1 g Oral BID  . pantoprazole (PROTONIX) IV  40 mg Intravenous Q24H  . potassium chloride SA  20 mEq Oral Daily  . pravastatin  20 mg Oral Once per day on Mon Tue Wed Thu  . sodium chloride  3 mL Intravenous Q12H  . Warfarin - Pharmacist Dosing Inpatient   Does not apply q1800   Continuous Infusions: . dexmedetomidine 0.5 mcg/kg/hr (01/23/14 0600)  . norepinephrine (LEVOPHED) Adult infusion 10 mcg/min (01/23/14 0742)  .  sodium bicarbonate  infusion 1000 mL 100 mL/hr at 01/23/14 0724   PRN Meds:Place/Maintain arterial line **AND** sodium chloride, acetaminophen **OR** acetaminophen, albuterol, alum & mag hydroxide-simeth, fentaNYL, LORazepam, ondansetron **OR** ondansetron (ZOFRAN) IV,  promethazine   Objective:  Filed Vitals:   01/23/14 0630  BP:   Pulse: 77  Temp:   Resp: 14      Intake/Output Summary (Last 24 hours) at 01/23/14 0746 Last data filed at 01/23/14 0600  Gross per 24 hour  Intake 2797.7 ml  Output    920 ml  Net 1877.7 ml    ECOG PERFORMANCE STATUS: 4  GENERAL: Critically ill appearing,intubated and sedated. Non responsive at this time SKIN: skin color, texture, turgor are pale, no rashes or significant lesions OROPHARYNX:no exudate, no erythema and lips, buccal mucosa, and tongue normal  NECK: supple, thyroid normal size, non-tender, without nodularity LYMPH:  no palpable lymphadenopathy in the cervical, axillary or inguinal LUNGS: Trace of rales and rhonchi at the bases anteriorily, uses accessory muscles, shallow breathing. decreased breath sounds on the left. Right port appears nontender HEART:regular rate & rhythm and no murmurs, with occasional ectopic beat and no lower extremity edema ABDOMEN: abdomen soft,non tender and normal bowel sounds Musculoskeletal:no cyanosis of digits and no clubbing  PSYCH: The patient is not alert at this time due to sedation NEURO: no apparent focal motor/sensory deficits, The patient is unable to interact due to sedation     CBG (last 3)   Recent Labs  01/22/14 0846 01/22/14 1201 01/22/14 1522  GLUCAP 219* 229* 237*     Labs:   Recent Labs Lab 01/18/14 1751 01/19/14 0726  01/20/14 0535 01/21/14 0520 01/22/14 0332 01/23/14 0410  WBC 21.9* 26.4* 37.9* 60.0* 43.9* 24.0*  HGB 10.8* 9.2* 10.1* 10.6* 9.3* 8.9*  HCT 33.4* 28.1* 31.2* 32.6* 28.0* 26.0*  PLT 86* 67* 69* 124* 110* 100*  MCV 84.3 84.1 85.2 84.5 83.1 81.0  MCH 27.3 27.5 27.6 27.5 27.6 27.7  MCHC 32.3 32.7 32.4 32.5 33.2 34.2  RDW 14.5 14.9 15.3 15.7* 15.9* 15.5  LYMPHSABS 0.7  --   --   --   --   --   MONOABS 0.9  --   --   --   --   --   EOSABS 0.2  --   --   --   --   --   BASOSABS 0.0  --   --   --   --   --       Chemistries:    Recent Labs Lab 01/18/14 1751  01/20/14 1849 01/21/14 0407 01/21/14 1600 01/22/14 0332 01/23/14 0410  NA 137  < > 135* 134* 135* 133* 138  K 3.5*  < > 4.2 3.9 4.5 4.0 3.4*  CL 99  < > 105 105 105 100 99  CO2 24  < > 14* 12* 17* 18* 25  GLUCOSE 213*  < > 269* 301* 174* 225* 228*  BUN 14  < > 38* 44* 50* 59* 62*  CREATININE 0.75  < > 2.06* 2.13* 2.28* 2.30* 2.01*  CALCIUM 9.3  < > 7.5* 7.7* 8.3* 8.2* 8.0*  MG  --   --   --   --   --  2.2 2.1  AST 21  --   --   --   --   --   --   ALT 29  --   --   --   --   --   --   ALKPHOS 166*  --   --   --   --   --   --   BILITOT 0.7  --   --   --   --   --   --   < > = values in this interval not displayed.  GFR Estimated Creatinine Clearance: 24.9 mL/min (by C-G formula based on Cr of 2.01).  Liver Function Tests:  Recent Labs Lab 01/18/14 1751  AST 21  ALT 29  ALKPHOS 166*  BILITOT 0.7  PROT 6.7  ALBUMIN 3.5   No results for input(s): LIPASE, AMYLASE in the last 168 hours. No results for input(s): AMMONIA in the last 168 hours.  Urine Studies     Component Value Date/Time   COLORURINE YELLOW 01/18/2014 1754   APPEARANCEUR CLOUDY* 01/18/2014 1754   LABSPEC 1.014 01/18/2014 1754   PHURINE 7.0 01/18/2014 1754   GLUCOSEU NEGATIVE 01/18/2014 1754   HGBUR MODERATE* 01/18/2014 1754   BILIRUBINUR NEGATIVE 01/18/2014 1754   KETONESUR 15* 01/18/2014 1754   PROTEINUR 100* 01/18/2014 1754   UROBILINOGEN 1.0 01/18/2014 1754   NITRITE NEGATIVE 01/18/2014 1754   LEUKOCYTESUR SMALL* 01/18/2014 1754    Coagulation profile  Recent Labs Lab 01/19/14 0726 01/20/14 0535 01/21/14 0407 01/22/14 0332 01/23/14 0410  INR 2.23* 2.06* 3.00* 4.83* 4.03*    Cardiac Enzymes:  Recent Labs Lab 01/22/14 1730 01/23/14 0010  TROPONINI <0.30 <0.30   BNP: Invalid input(s): POCBNP CBG:  Recent Labs Lab 01/21/14 1605 01/21/14 2103 01/22/14 0846 01/22/14 1201 01/22/14 1522  GLUCAP 148* 150* 219* 229*  237*     Imaging Studies:  Dg Chest [facility]  1 View  01/23/2014   CLINICAL DATA:  Respiratory failure with hypoxia  EXAM: PORTABLE CHEST - 1 VIEW  COMPARISON:  01/22/2014  FINDINGS: Chronic cardiomegaly. There is pulmonary edema which is similar to yesterday. Haziness of the lower chest consistent with pleural effusions. No pneumothorax.  New orogastric tube which reaches the stomach. Endotracheal tube tip ends just below the clavicular heads. Right porta catheter and left IJ central line remain in good position.  IMPRESSION: New orogastric tube is in good position.  Bibasilar airspace disease with pulmonary edema and pleural effusions.   Electronically Signed   By: Jorje Guild M.D.   On: 01/23/2014 04:36   Dg Chest Port 1 View  01/22/2014   CLINICAL DATA:  Hypoxia  EXAM: PORTABLE CHEST - 1 VIEW  COMPARISON:  January 21, 2014  FINDINGS: Endotracheal tube tip is 5.8 cm above the carina. Left jugular catheter tip is in the superior vena cava just beyond the junction with the left innominate vein. Port-A-Cath tip is in the distal superior vena cava near the junction with the atrium. No pneumothorax.  There are bilateral effusions with mild interstitial edema. There is consolidation throughout much of the left lower lobe. Heart is mildly enlarged with pulmonary vascularity within normal limits.  IMPRESSION: Tube and catheter positions as described without pneumothorax. Left lower lobe consolidation. Suspect pneumonia. Findings felt to be indicative of a degree of underlying congestive heart failure as well.   Electronically Signed   By: Lowella Grip M.D.   On: 01/22/2014 12:47   Dg Chest Port 1 View  01/21/2014   CLINICAL DATA:  Respiratory distress  EXAM: PORTABLE CHEST - 1 VIEW  COMPARISON:  12/21/2013  FINDINGS: Right chest wall port a catheter is noted with tip in the cavoatrial junction. Stable cardiac enlargement. Bilateral pleural effusions are identified left-greater-than-right. These appear  slightly increased in volume from previous exam. Stable edema Diminished aeration of both lung bases is identified left greater night. There is mild interstitial edema. New atelectasis within the right midlung noted.  IMPRESSION: 1. Persistent bilateral pleural effusions and edema. 2. New right midlung atelectasis. 3. Little change in diminished aeration to both lung bases.   Electronically Signed   By: Kerby Moors M.D.   On: 01/21/2014 23:47    Assessment/Plan: 73 y.o.  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 status post cycle 1 of chemotherapy with bendamustine and rituximab on 01/09/2014-bendamustine on day 1 and 2 with rituximab on day 1 and q 28 days, for planned 6 cycles for curative intent Neulasta support was given on 11/13. Continue aggressive supportive care.  Metabolic acidosis, with confusion and agitation The patient received IV bicarbonate,haldol  She was intubated last night for airway protection   Atrial fibrillation Her most recent echocardiogram show preserved ejection fraction. On Cardizem, Digoxin And Coumadin Recommend close follow-up and INR monitoring as INR supratherapeutic today. Pharmacy is following  Anemia Thrombocytopenia This is likely anemia of chronic disease and recent chemotherapy No bleeding issues reported overnight such as epistaxis, hematuria or hematochezia.  She does not require transfusion now unless hemoglobin less than 8 g, and platelets less than 10,000 or  20,000 if acutely bleeding  Leukocytosis In the setting of Lymphoma, recent Neulasta on 11/13, steroids, Inflammation, fever, and dyspnea This is trending down to normal  At high risk of tumor lysis syndrome, renal failure from recent sepsis Continue fluid hydration and allopurinol. Creatinine trending down  Abdominal  distention  Diarrhea May be due to recent chemotherapy C. difficile is negative diarrhea resolved    Escherichia coli in urine with renal failure On Rocephin IV per primary team  DVT prophylaxis On Coumadin. Consider holding the dose today by pharmacy as INR is supratherapeutic  Full Code Other medical issues as per admitting team Prognosis guarded. On maximum supportive care     **Disclaimer: This note was dictated with voice recognition software. Similar sounding words can inadvertently be transcribed and this note may contain transcription errors which may not have been corrected upon publication of note.Sharene Butters E, PA-C 01/23/2014  7:46 AM Sabri Teal, MD 01/23/2014

## 2014-01-23 NOTE — Progress Notes (Addendum)
Inpatient Diabetes Program Recommendations  AACE/ADA: New Consensus Statement on Inpatient Glycemic Control (2013)  Target Ranges:  Prepandial:   less than 140 mg/dL      Peak postprandial:   less than 180 mg/dL (1-2 hours)      Critically ill patients:  140 - 180 mg/dL     Results for DALLYS, NOWAKOWSKI (MRN [id number]) as of 01/23/2014 07:45  Ref. Range 01/22/2014 08:46 01/22/2014 12:01 01/22/2014 15:22  Glucose-Capillary Latest Range: 70-99 mg/dL 219 (H) 229 (H) 237 (H)    Results for CERI, MAYER (MRN [id number]) as of 01/23/2014 07:45  Ref. Range 01/23/2014 04:10  Glucose Latest Range: 70-99 mg/dL 228 (H)    Patient intubated yesteday. Glucose levels elevated.   Patient currently receiving IV Solucortef 50 mg Q6 hours.  Current Insulin Orders= Lantus 10 units daily.  Not sure why Novolog SSI discontinued?    MD- Please consider the following:  1. Increase Lantus dose by 20% to Lantus 12 units daily 2. Please add back Novolog Moderate SSI coverage Q4 hours   Will follow Wyn Quaker RN, MSN, CDE Diabetes Coordinator Inpatient Diabetes Program Team Pager: [phone] (8a-10p)

## 2014-01-24 ENCOUNTER — Inpatient Hospital Stay (HOSPITAL_COMMUNITY): Payer: Medicare Other

## 2014-01-24 LAB — CBC WITH DIFFERENTIAL/PLATELET
Basophils Absolute: 0 10*3/uL (ref 0.0–0.1)
Basophils Relative: 0 % (ref 0–1)
Eosinophils Absolute: 0.2 10*3/uL (ref 0.0–0.7)
Eosinophils Relative: 2 % (ref 0–5)
HEMATOCRIT: 26 % — AB (ref 36.0–46.0)
HEMOGLOBIN: 8.6 g/dL — AB (ref 12.0–15.0)
LYMPHS ABS: 0.4 10*3/uL — AB (ref 0.7–4.0)
LYMPHS PCT: 3 % — AB (ref 12–46)
MCH: 27.7 pg (ref 26.0–34.0)
MCHC: 33.1 g/dL (ref 30.0–36.0)
MCV: 83.6 fL (ref 78.0–100.0)
MONO ABS: 0.4 10*3/uL (ref 0.1–1.0)
MONOS PCT: 3 % (ref 3–12)
Neutro Abs: 13.3 10*3/uL — ABNORMAL HIGH (ref 1.7–7.7)
Neutrophils Relative %: 93 % — ABNORMAL HIGH (ref 43–77)
PLATELETS: 81 10*3/uL — AB (ref 150–400)
RBC: 3.11 MIL/uL — AB (ref 3.87–5.11)
RDW: 15.4 % (ref 11.5–15.5)
WBC: 14.2 10*3/uL — AB (ref 4.0–10.5)

## 2014-01-24 LAB — GLUCOSE, CAPILLARY
GLUCOSE-CAPILLARY: 175 mg/dL — AB (ref 70–99)
GLUCOSE-CAPILLARY: 133 mg/dL — AB (ref 70–99)
GLUCOSE-CAPILLARY: 106 mg/dL — AB (ref 70–99)
Glucose-Capillary: 134 mg/dL — ABNORMAL HIGH (ref 70–99)
Glucose-Capillary: 140 mg/dL — ABNORMAL HIGH (ref 70–99)
Glucose-Capillary: 144 mg/dL — ABNORMAL HIGH (ref 70–99)
Glucose-Capillary: 117 mg/dL — ABNORMAL HIGH (ref 70–99)
Glucose-Capillary: 127 mg/dL — ABNORMAL HIGH (ref 70–99)
Glucose-Capillary: 132 mg/dL — ABNORMAL HIGH (ref 70–99)
Glucose-Capillary: 147 mg/dL — ABNORMAL HIGH (ref 70–99)

## 2014-01-24 LAB — BASIC METABOLIC PANEL
Anion gap: 13 (ref 5–15)
BUN: 62 mg/dL — AB (ref 6–23)
CALCIUM: 8.2 mg/dL — AB (ref 8.4–10.5)
CO2: 28 meq/L (ref 19–32)
Chloride: 101 mEq/L (ref 96–112)
Creatinine, Ser: 1.58 mg/dL — ABNORMAL HIGH (ref 0.50–1.10)
GFR calc Af Amer: 36 mL/min — ABNORMAL LOW (ref >90)
GFR calc non Af Amer: 31 mL/min — ABNORMAL LOW (ref >90)
Glucose, Bld: 133 mg/dL — ABNORMAL HIGH (ref 70–99)
Potassium: 3.3 mEq/L — ABNORMAL LOW (ref 3.7–5.3)
SODIUM: 142 meq/L (ref 137–147)

## 2014-01-24 LAB — PROCALCITONIN: Procalcitonin: 4.81 ng/mL

## 2014-01-24 LAB — MAGNESIUM: Magnesium: 1.9 mg/dL (ref 1.5–2.5)

## 2014-01-24 LAB — PROTIME-INR
INR: 3.2 — ABNORMAL HIGH (ref 0.00–1.49)
Prothrombin Time: 33 seconds — ABNORMAL HIGH (ref 11.6–15.2)

## 2014-01-24 LAB — PHOSPHORUS: PHOSPHORUS: 3.4 mg/dL (ref 2.3–4.6)

## 2014-01-24 MED ORDER — FUROSEMIDE 10 MG/ML IJ SOLN
40.0000 mg | Freq: Three times a day (TID) | INTRAMUSCULAR | Status: DC
  Administered 2014-01-24 – 2014-01-26 (×6): 40 mg via INTRAVENOUS
  Filled 2014-01-24 (×6): qty 4

## 2014-01-24 MED ORDER — MAGNESIUM SULFATE 2 GM/50ML IV SOLN
2.0000 g | Freq: Once | INTRAVENOUS | Status: AC
  Administered 2014-01-24: 2 g via INTRAVENOUS
  Filled 2014-01-24: qty 50

## 2014-01-24 MED ORDER — POTASSIUM CHLORIDE 20 MEQ/15ML (10%) PO SOLN
40.0000 meq | Freq: Two times a day (BID) | ORAL | Status: DC
  Administered 2014-01-24 – 2014-01-26 (×6): 40 meq
  Filled 2014-01-24 (×7): qty 30

## 2014-01-24 MED ORDER — POTASSIUM CHLORIDE 20 MEQ/15ML (10%) PO SOLN
20.0000 meq | Freq: Every day | ORAL | Status: DC
  Administered 2014-01-24: 20 meq
  Filled 2014-01-24: qty 15

## 2014-01-24 MED ORDER — FENTANYL CITRATE 0.05 MG/ML IJ SOLN
12.5000 ug | INTRAMUSCULAR | Status: DC | PRN
  Administered 2014-01-24 (×2): 12.5 ug via INTRAVENOUS
  Filled 2014-01-24 (×2): qty 2

## 2014-01-24 NOTE — Progress Notes (Signed)
Pt rhythm unclear due to low amplitude. EKG done. Rhythm still unclear. Notified Dr. Lamonte Sakai and discussed rhythm, and he believes it is A fib. Will continue to monitor pt.

## 2014-01-24 NOTE — Progress Notes (Signed)
PULMONARY / CRITICAL CARE MEDICINE   Name: Patty [name] MRN: [id number] DOB: 11-Jun-1940    ADMISSION DATE:  01/18/2014 CONSULTATION DATE:  01/20/2014  REFERRING MD : Dr. Rockne Menghini  CHIEF COMPLAINT: Confusion  INITIAL PRESENTATION:  73 yo female presented with altered mental status, nausea, fever (105.1), ? LLL infiltrate on CXR. Dx w E coli bacteremia. She has hx of follicular lymphoma (first chemo on 01/09/14), A fib on coumadin, HTN, HLD, Hypothyroidism, DM.  STUDIES:  11/06 Echo >> EF 55 to 60%  SIGNIFICANT EVENTS: 11/20 Admit 11/22 Transfer to SDU with hypotension 11/23 Started on BiPAP for increased WOB in setting progressive metabolic acidosis.  11/24 worsening resp distress. 11/24 Intubated; ST depressions noted ECG, troponin negative 01/23/14" : Tolerated intubation. UOP has improved last 24h    SUBJECTIVE/OVERNIGHT/INTERVAL HX 1126/15 : meets "official" extubation criteria but wheezing and cxr extremely wet.  +10.5L since admit 01/18/2014. CVP 13    VITAL SIGNS: Temp:  [95.7 F (35.4 C)-98.2 F (36.8 C)] 97.5 F (36.4 C) (11/26 0800) Pulse Rate:  [53-71] 65 (11/26 1100) Resp:  [14-25] 20 (11/26 1100) BP: (90)/(54) 90/54 mmHg (11/26 0800) SpO2:  [96 %-100 %] 97 % (11/26 1100) Arterial Line BP: (110-197)/(42-82) 135/52 mmHg (11/26 1100) FiO2 (%):  [30 %] 30 % (11/26 0900) Weight:  [76.1 kg (167 lb 12.3 oz)] 76.1 kg (167 lb 12.3 oz) (11/26 0500) INTAKE / OUTPUT:  Intake/Output Summary (Last 24 hours) at 01/24/14 1212 Last data filed at 01/24/14 1100  Gross per 24 hour  Intake 1208.27 ml  Output   2380 ml  Net -1171.73 ml    PHYSICAL EXAMINATION: General:  Ill appearing, On vent.  Neuro:  Watchin TV . CAM-ICU negative for delirium. Moves all 4s RASS +1 HEENT:  Dry oral mucosa, no oral lesions Cardiovascular:  Regular, tachycardic Lungs:  B/l rhonchi + Abdomen:  Firm, non tender, decreased bowel sounds Musculoskeletal:  EDema + Skin:  No rashes, Rt  upper chest port site clean  LABS: PULMONARY  Recent Labs Lab 01/21/14 0300 01/21/14 0510 01/21/14 2305 01/22/14 0445 01/22/14 1315  PHART 7.256* 7.295* 7.199* 7.220* 7.340*  PCO2ART 28.5* 25.5* 34.7* 43.4 41.1  PO2ART 70.3* 87.8 92.8 74.0* 111.0*  HCO3 12.3* 12.1* 13.0* 17.2* 21.6  TCO2 11.8 11.5 12.7 16.7 20.5  O2SAT 89.1 93.8 93.7 91.0 95.4    CBC  Recent Labs Lab 01/22/14 0332 01/23/14 0410 01/24/14 0430  HGB 9.3* 8.9* 8.6*  HCT 28.0* 26.0* 26.0*  WBC 43.9* 24.0* 14.2*  PLT 110* 100* 81*    COAGULATION  Recent Labs Lab 01/20/14 0535 01/21/14 0407 01/22/14 0332 01/23/14 0410 01/24/14 0430  INR 2.06* 3.00* 4.83* 4.03* 3.20*    CARDIAC   Recent Labs Lab 01/22/14 1730 01/23/14 0010 01/23/14 0828  TROPONINI <0.30 <0.30 <0.30    Recent Labs Lab 01/20/14 1035  PROBNP 36152.0*     CHEMISTRY  Recent Labs Lab 01/21/14 0407 01/21/14 1600 01/22/14 0332 01/23/14 0410 01/24/14 0430  NA 134* 135* 133* 138 142  K 3.9 4.5 4.0 3.4* 3.3*  CL 105 105 100 99 101  CO2 12* 17* 18* 25 28  GLUCOSE 301* 174* 225* 228* 133*  BUN 44* 50* 59* 62* 62*  CREATININE 2.13* 2.28* 2.30* 2.01* 1.58*  CALCIUM 7.7* 8.3* 8.2* 8.0* 8.2*  MG  --   --  2.2 2.1 1.9  PHOS  --   --  4.9*  --  3.4   Estimated Creatinine Clearance: 32.1 mL/min (by C-G formula  based on Cr of 1.58).   LIVER  Recent Labs Lab 01/18/14 1751  01/20/14 0535 01/21/14 0407 01/22/14 0332 01/23/14 0410 01/24/14 0430  AST 21  --   --   --   --   --   --   ALT 29  --   --   --   --   --   --   ALKPHOS 166*  --   --   --   --   --   --   BILITOT 0.7  --   --   --   --   --   --   PROT 6.7  --   --   --   --   --   --   ALBUMIN 3.5  --   --   --   --   --   --   INR 2.20*  < > 2.06* 3.00* 4.83* 4.03* 3.20*  < > = values in this interval not displayed.   INFECTIOUS  Recent Labs Lab 01/18/14 1758 01/20/14 1035 01/22/14 0333 01/22/14 1730 01/23/14 0410 01/24/14 0415  LATICACIDVEN  1.05 3.4* 1.0  --   --   --   PROCALCITON  --   --   --  17.55 10.93 4.81     ENDOCRINE CBG (last 3)   Recent Labs  01/23/14 1928 01/24/14 0019 01/24/14 0346  GLUCAP 140* 134* 144*         IMAGING x48h Dg Chest Port 1 View  01/24/2014   CLINICAL DATA:  Respiratory failure.  Intubated patient.  EXAM: PORTABLE CHEST - 1 VIEW  COMPARISON:  Single view of the chest 01/22/2014 and 01/23/2014.  FINDINGS: Support tubes and lines are unchanged. Bilateral effusions and airspace disease persist and have worsened over the past two days. There is cardiomegaly. No pneumothorax.  IMPRESSION: Worsened bilateral pleural effusions and airspace disease.   Electronically Signed   By: Inge Rise M.D.   On: 01/24/2014 08:36   Dg Chest Port 1 View  01/23/2014   CLINICAL DATA:  Respiratory failure with hypoxia  EXAM: PORTABLE CHEST - 1 VIEW  COMPARISON:  01/22/2014  FINDINGS: Chronic cardiomegaly. There is pulmonary edema which is similar to yesterday. Haziness of the lower chest consistent with pleural effusions. No pneumothorax.  New orogastric tube which reaches the stomach. Endotracheal tube tip ends just below the clavicular heads. Right porta catheter and left IJ central line remain in good position.  IMPRESSION: New orogastric tube is in good position.  Bibasilar airspace disease with pulmonary edema and pleural effusions.   Electronically Signed   By: Jorje Guild M.D.   On: 01/23/2014 04:36   Dg Chest Port 1 View  01/22/2014   CLINICAL DATA:  Hypoxia  EXAM: PORTABLE CHEST - 1 VIEW  COMPARISON:  January 21, 2014  FINDINGS: Endotracheal tube tip is 5.8 cm above the carina. Left jugular catheter tip is in the superior vena cava just beyond the junction with the left innominate vein. Port-A-Cath tip is in the distal superior vena cava near the junction with the atrium. No pneumothorax.  There are bilateral effusions with mild interstitial edema. There is consolidation throughout much of the  left lower lobe. Heart is mildly enlarged with pulmonary vascularity within normal limits.  IMPRESSION: Tube and catheter positions as described without pneumothorax. Left lower lobe consolidation. Suspect pneumonia. Findings felt to be indicative of a degree of underlying congestive heart failure as well.   Electronically Signed  By: Lowella Grip M.D.   On: 01/22/2014 12:47        ASSESSMENT / PLAN:  PULMONARY A: Hx  -  of OSA and H of asthma. Curerntly  Acute respiratory failure due to inability to compensate for metabolic acidosis , worsening 11/24 and intubated   - 01/24/14: Though meets extubation criteria officially very high risk to fail extubation on account of  Significant volume overload  P:   PSV as tolerated ; no extubation 01/24/14 Full vent support otherwise Aim extubation 01/25/14 and beyond dependign on diuresis  CARDIOVASCULAR Port A:  Hx   - of permanent A fib on chronic coumadin, HLD, HTN. At admission   - Severe sepsis, septic shock  Currently  01/24/14- OFf pressors but significant volume overload + .  Likely acute diastolic CHF, ECho resuls 01/23/14 pending  P:  START AGGRESSIVE LASIX 01/24/14 Continue digoxin, check level prn given renal insufficiency Hold maxzide, dilt for now  Continue zetia, pravastatin Coumadin per pharmacy, note INR rising. No evidence bleeding but consider Vit K; decreasing on its own on 11/25  RENAL A:   Acute oliguric renal failure, S Cr and UOP starting to improve 11/25 and further 01/24/14 Mild low K and low mag 01/24/14   P:   Replete mag and k Watch K and creat with lasix  GASTROINTESTINAL A:   Hx of GERD. 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 Lymphocytosis >> received neulasta recently as part of chemo regimen.  Current Anemia, - unchagned  thrombocytopenia. - worsening  P:  F/u CBC; PRBC for hgb  < 7gm% Continue allopurinol after chemo for  lymphoma  INFECTIOUS  Blood cx 11/20 >> E coli Urine cx 11/20 >> E coli/klebsiella  C diff PCR 11/21 >> negative Blood cx 11/22 >>    A:   Severe sepsis from PNA, UTI, E coli bacteremia. Diarrhea developed 11/21. C diff negative 11/22 P:   Vanco 11/21 >> 11/23 Cefepime 11/21 >> 11/23 Ceftriaxone 11/23 (has a PCN allergy, but did tolerate cefepime) >>    ENDOCRINE A:   Hx of DM type II, hypothyroidism. Relative adrenal insufficiency >> received decadron with chemo. P:   SSI Hold metformin for now Continue synthroid Started solu cortef 11/22  NEUROLOGIC A:   Anxiety/Confusion 01/22/14    - 112/6/15 - normal mental status on precedex  P:   Sedation per PAD protocol with precedex and porn fetntanyl   FAMILY   - Updates: patient, husband, daughter updated in full 11/25. Updated patient on vent 01/24/14. No family at bedside 01/24/14  - Inter-disciplinary family meet or Palliative Care meeting due by 01/27/14   TODAY'S SUMMARY:  E coli bacteremia and septic shock, ARF, acute resp failure.  +10L on 01/24/14. Needs aggressive diuresis before extubation; might take 1-3 days    The patient is critically ill with multiple organ systems failure and requires high complexity decision making for assessment and support, frequent evaluation and titration of therapies, application of advanced monitoring technologies and extensive interpretation of multiple databases.   Critical Care Time devoted to patient care services described in this note is  31  Minutes. This time reflects time of care of this signee Dr Brand Males. This critical care time does not reflect procedure time, or teaching time or supervisory time of PA/NP/Med student/Med Resident etc but could involve care discussion time    Dr. Brand Males, M.D., [facility].C.P Pulmonary and Critical  Care Medicine Staff Physician [facility] Pager: [phone], If no answer or  between  15:00h - 7:00h: call [phone]  01/24/2014 12:25 PM

## 2014-01-24 NOTE — Progress Notes (Signed)
Patient passed SBT(5/5) and weaned on (8/5) - total time of SBT and wean 0900-1250. Placed back on full support due to increased WOB, increased RR, increased BP- RN aware.

## 2014-01-24 NOTE — Progress Notes (Signed)
ANTICOAGULATION CONSULT NOTE - Follow Up  Pharmacy Consult for Warfarin Indication: atrial fibrillation  Allergies  Allergen Reactions  . Morphine And Related Anaphylaxis and Other (See Comments)    Pt states she stopped breathing post op- "went into resp. arrest"  . Demerol Nausea And Vomiting  . Meperidine Hcl Nausea And Vomiting  . Multaq [Dronedarone] Other (See Comments)    Reaction:  Blood in urine and elevated liver enzymes.   Jeanie Cooks Allergy] Rash  . Penicillins Itching and Rash  . Sulfa Drugs Cross Reactors Rash   Patient Measurements: Height: 5\' 6"  (167.6 cm) Weight: 167 lb 12.3 oz (76.1 kg) IBW/kg (Calculated) : 59.3  Vital Signs: Temp: 98.5 F (36.9 C) (11/26 1200) Temp Source: Oral (11/26 1200) BP: 90/54 mmHg (11/26 0800) Pulse Rate: 67 (11/26 1300)  Labs:  Recent Labs  01/22/14 0332 01/22/14 1730 01/23/14 0010 01/23/14 0410 01/23/14 0828 01/24/14 0430  HGB 9.3*  --   --  8.9*  --  8.6*  HCT 28.0*  --   --  26.0*  --  26.0*  PLT 110*  --   --  100*  --  81*  LABPROT 45.5*  --   --  39.5*  --  33.0*  INR 4.83*  --   --  4.03*  --  3.20*  CREATININE 2.30*  --   --  2.01*  --  1.58*  TROPONINI  --  <0.30 <0.30  --  <0.30  --    Estimated Creatinine Clearance: 32.1 mL/min (by C-G formula based on Cr of 1.58).  Medical History: Past Medical History  Diagnosis Date  . Asthma 06/15/2010  . Hypothyroidism 06/15/2010  . Hyperlipemia 06/15/2010  . Atrial fibrillation 05/28/2010    Managed with rate control and coumadin  . Long-term (current) use of anticoagulants   . ACE-inhibitor cough   . DI (detrusor instability)   . Fibroid   . Rectocele   . Atrophic vaginitis   . 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 Diabetes mellitus     Type 2  . Peripheral neuropathy   . Hypertension   . Dysrhythmia     cardioversion - 2012  . Complication of anesthesia 1980's    post anesth.- states she "went into resp. arrest" , but then remarked that  she thought maybe they gave her too much medicine (morphine)   . Shortness of breath     sometimes   . 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 Anemia   . Malignant lymphoma, follicular 25/0/53  . CPAP (continuous positive airway pressure) dependence   . GERD (gastroesophageal reflux disease)   . Hearing loss   . Sleep apnea     uses CPAP everynight- last study in [place] 5 yrs. or more   . Severe sepsis with septic shock 01/21/2014  . E coli bacteremia 01/20/2014   Medications:  Scheduled:  . acyclovir  400 mg Oral Daily  . allopurinol  300 mg Oral Daily  . antiseptic oral rinse  7 mL Mouth Rinse QID  . arformoterol  15 mcg Nebulization BID  . budesonide (PULMICORT) nebulizer solution  0.25 mg Nebulization BID  . cefTRIAXone (ROCEPHIN)  IV  1 g Intravenous Q24H  . chlorhexidine  15 mL Mouth Rinse BID  . cholecalciferol  2,000 Units Oral Q lunch  . digoxin  125 mcg Oral Daily  . ezetimibe  10 mg Oral Daily  . furosemide  40 mg Intravenous 3 times  per day  . hydrocortisone sod succinate (SOLU-CORTEF) inj  50 mg Intravenous Q6H  . insulin aspart  2-6 Units Subcutaneous 6 times per day  . insulin glargine  10 Units Subcutaneous Daily  . levothyroxine  250 mcg Oral QAC breakfast  . magic mouthwash w/lidocaine  5 mL Oral QID  . magnesium oxide  400 mg Oral QHS  . magnesium sulfate 1 - 4 g bolus IVPB  2 g Intravenous Once  . montelukast  10 mg Oral Daily  . pantoprazole (PROTONIX) IV  40 mg Intravenous Q24H  . potassium chloride  40 mEq Per Tube BID  . pravastatin  20 mg Oral Once per day on Mon Tue Wed Thu  . sodium chloride  3 mL Intravenous Q12H  . Warfarin - Pharmacist Dosing Inpatient   Does not apply q1800  Inpatient warfarin doses administered from 11/20: 5mg  at home prior to admission, 2.5mg , 5mg , none, none  Assessment: 34 yoF admitted with sepsis secondary to HCAP following first course of chemotherapy for NHL. On chronic Warfarin for Afib: home dose 2.5mg  on Wed, Sat with  5mg  other days, last dose 11/20 (5mg  - night of admission). Admit INR 2.20, in therapeutic range.    Goal of Therapy:  INR 2-3  Today, 11/26:  INR 3.2 (from 4.03 yesterday) decreasing  Pltc 81K post-chemotherapy, decreasing from high 124K on 11/23  Hgb low,stable post-chemotherapy.  No bleeding reported  On cardiac diet-poor po intake  Received Metronidazole x 3 doses 11/22-11/23 (too early to see effect on INR)  1. No Warfarin today 2. Follow daily PT/INR 3. Follow CBC - following pltc post-chemotherapy 4. Follow clinical course and any reports of bleeding.  Dolly Rias RPh 01/24/2014, 1:48 PM Pager [phone]

## 2014-01-25 ENCOUNTER — Inpatient Hospital Stay (HOSPITAL_COMMUNITY): Payer: Medicare Other

## 2014-01-25 DIAGNOSIS — N170 Acute kidney failure with tubular necrosis: Secondary | ICD-10-CM

## 2014-01-25 LAB — PROTIME-INR
INR: 2.82 — ABNORMAL HIGH (ref 0.00–1.49)
Prothrombin Time: 29.9 seconds — ABNORMAL HIGH (ref 11.6–15.2)

## 2014-01-25 LAB — CBC WITH DIFFERENTIAL/PLATELET
Basophils Absolute: 0 10*3/uL (ref 0.0–0.1)
Basophils Relative: 0 % (ref 0–1)
EOS PCT: 1 % (ref 0–5)
Eosinophils Absolute: 0.2 10*3/uL (ref 0.0–0.7)
HCT: 27.1 % — ABNORMAL LOW (ref 36.0–46.0)
HEMOGLOBIN: 8.5 g/dL — AB (ref 12.0–15.0)
LYMPHS ABS: 0.5 10*3/uL — AB (ref 0.7–4.0)
Lymphocytes Relative: 3 % — ABNORMAL LOW (ref 12–46)
MCH: 26.9 pg (ref 26.0–34.0)
MCHC: 31.4 g/dL (ref 30.0–36.0)
MCV: 85.8 fL (ref 78.0–100.0)
MONOS PCT: 4 % (ref 3–12)
Monocytes Absolute: 0.7 10*3/uL (ref 0.1–1.0)
NEUTROS PCT: 92 % — AB (ref 43–77)
Neutro Abs: 15.3 10*3/uL — ABNORMAL HIGH (ref 1.7–7.7)
Platelets: 116 10*3/uL — ABNORMAL LOW (ref 150–400)
RBC: 3.16 MIL/uL — AB (ref 3.87–5.11)
RDW: 15.2 % (ref 11.5–15.5)
WBC: 16.6 10*3/uL — AB (ref 4.0–10.5)

## 2014-01-25 LAB — GLUCOSE, CAPILLARY
GLUCOSE-CAPILLARY: 133 mg/dL — AB (ref 70–99)
GLUCOSE-CAPILLARY: 124 mg/dL — AB (ref 70–99)
GLUCOSE-CAPILLARY: 80 mg/dL (ref 70–99)
GLUCOSE-CAPILLARY: 98 mg/dL (ref 70–99)
Glucose-Capillary: 125 mg/dL — ABNORMAL HIGH (ref 70–99)
Glucose-Capillary: 127 mg/dL — ABNORMAL HIGH (ref 70–99)

## 2014-01-25 LAB — BASIC METABOLIC PANEL
Anion gap: 12 (ref 5–15)
BUN: 61 mg/dL — ABNORMAL HIGH (ref 6–23)
CO2: 29 mEq/L (ref 19–32)
Calcium: 8.5 mg/dL (ref 8.4–10.5)
Chloride: 102 mEq/L (ref 96–112)
Creatinine, Ser: 1.45 mg/dL — ABNORMAL HIGH (ref 0.50–1.10)
GFR calc Af Amer: 40 mL/min — ABNORMAL LOW (ref >90)
GFR calc non Af Amer: 34 mL/min — ABNORMAL LOW (ref >90)
GLUCOSE: 129 mg/dL — AB (ref 70–99)
Potassium: 3.4 mEq/L — ABNORMAL LOW (ref 3.7–5.3)
SODIUM: 143 meq/L (ref 137–147)

## 2014-01-25 LAB — MAGNESIUM: MAGNESIUM: 2.2 mg/dL (ref 1.5–2.5)

## 2014-01-25 LAB — PHOSPHORUS: PHOSPHORUS: 3.5 mg/dL (ref 2.3–4.6)

## 2014-01-25 MED ORDER — WARFARIN SODIUM 2.5 MG PO TABS
2.5000 mg | ORAL_TABLET | Freq: Once | ORAL | Status: AC
  Administered 2014-01-25: 2.5 mg via ORAL
  Filled 2014-01-25: qty 1

## 2014-01-25 MED ORDER — VITAMINS A & D EX OINT
TOPICAL_OINTMENT | CUTANEOUS | Status: AC
  Administered 2014-01-25: 5
  Filled 2014-01-25: qty 10

## 2014-01-25 MED ORDER — FENTANYL CITRATE 0.05 MG/ML IJ SOLN
12.5000 ug | INTRAMUSCULAR | Status: DC | PRN
  Administered 2014-01-25 – 2014-01-31 (×6): 25 ug via INTRAVENOUS
  Filled 2014-01-25 (×6): qty 2

## 2014-01-25 MED ORDER — VITAL HIGH PROTEIN PO LIQD: 1000.0000 mL | ORAL | Status: DC

## 2014-01-25 MED ORDER — VITAMINS A & D EX OINT
TOPICAL_OINTMENT | CUTANEOUS | Status: AC
  Administered 2014-01-25: 1
  Filled 2014-01-25: qty 5

## 2014-01-25 MED ORDER — ONDANSETRON HCL 4 MG/2ML IJ SOLN
4.0000 mg | Freq: Four times a day (QID) | INTRAMUSCULAR | Status: DC | PRN
  Administered 2014-01-25 – 2014-01-27 (×6): 4 mg via INTRAVENOUS
  Filled 2014-01-25 (×6): qty 2

## 2014-01-25 MED ORDER — HYDROCORTISONE NA SUCCINATE PF 100 MG IJ SOLR
50.0000 mg | Freq: Two times a day (BID) | INTRAMUSCULAR | Status: DC
  Administered 2014-01-25 – 2014-01-26 (×2): 50 mg via INTRAVENOUS
  Filled 2014-01-25 (×2): qty 2

## 2014-01-25 MED ORDER — FENTANYL CITRATE 0.05 MG/ML IJ SOLN: 25.0000 ug | INTRAMUSCULAR | Status: DC | PRN

## 2014-01-25 MED ORDER — VITAL HIGH PROTEIN PO LIQD
1000.0000 mL | ORAL | Status: DC
  Filled 2014-01-25: qty 1000

## 2014-01-25 NOTE — Procedures (Signed)
Extubation Procedure Note  Patient Details:   Name: Patty [name] DOB: June 28, 1940 MRN: [id number]   Airway Documentation:     Evaluation  O2 sats: stable throughout Complications: No apparent complications Patient did tolerate procedure well. Bilateral Breath Sounds: Clear Suctioning: Airway Yes  Estill Bamberg 01/25/2014, 2:15 PM

## 2014-01-25 NOTE — Progress Notes (Signed)
ANTICOAGULATION CONSULT NOTE - Follow Up  Pharmacy Consult for Warfarin Indication: atrial fibrillation  Allergies  Allergen Reactions  . Morphine And Related Anaphylaxis and Other (See Comments)    Pt states she stopped breathing post op- "went into resp. arrest"  . Demerol Nausea And Vomiting  . Meperidine Hcl Nausea And Vomiting  . Multaq [Dronedarone] Other (See Comments)    Reaction:  Blood in urine and elevated liver enzymes.   Jeanie Cooks Allergy] Rash  . Penicillins Itching and Rash  . Sulfa Drugs Cross Reactors Rash   Patient Measurements: Height: 5\' 6"  (167.6 cm) Weight: 162 lb 14.7 oz (73.9 kg) IBW/kg (Calculated) : 59.3  Vital Signs: Temp: 97.5 F (36.4 C) (11/27 0354) Temp Source: Oral (11/27 0354) BP: 116/58 mmHg (11/27 0400) Pulse Rate: 50 (11/27 0700)  Labs:  Recent Labs  01/22/14 1730 01/23/14 0010  01/23/14 0410 01/23/14 0828 01/24/14 0430 01/25/14 0430  HGB  --   --   < > 8.9*  --  8.6* 8.5*  HCT  --   --   --  26.0*  --  26.0* 27.1*  PLT  --   --   --  100*  --  81* 116*  LABPROT  --   --   --  39.5*  --  33.0* 29.9*  INR  --   --   --  4.03*  --  3.20* 2.82*  CREATININE  --   --   --  2.01*  --  1.58* 1.45*  TROPONINI <0.30 <0.30  --   --  <0.30  --   --   < > = values in this interval not displayed. Estimated Creatinine Clearance: 34.5 mL/min (by C-G formula based on Cr of 1.45).  Medical History: Past Medical History  Diagnosis Date  . Asthma 06/15/2010  . Hypothyroidism 06/15/2010  . Hyperlipemia 06/15/2010  . Atrial fibrillation 05/28/2010    Managed with rate control and coumadin  . Long-term (current) use of anticoagulants   . ACE-inhibitor cough   . DI (detrusor instability)   . Fibroid   . Rectocele   . Atrophic vaginitis   . 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 Diabetes mellitus     Type 2  . Peripheral neuropathy   . Hypertension   . Dysrhythmia     cardioversion - 2012  . Complication of anesthesia  1980's    post anesth.- states she "went into resp. arrest" , but then remarked that she thought maybe they gave her too much medicine (morphine)   . Shortness of breath     sometimes   . 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 Anemia   . Malignant lymphoma, follicular 32/6/71  . CPAP (continuous positive airway pressure) dependence   . GERD (gastroesophageal reflux disease)   . Hearing loss   . Sleep apnea     uses CPAP everynight- last study in [place] 5 yrs. or more   . Severe sepsis with septic shock 01/21/2014  . E coli bacteremia 01/20/2014   Medications:  Scheduled:  . acyclovir  400 mg Oral Daily  . allopurinol  300 mg Oral Daily  . antiseptic oral rinse  7 mL Mouth Rinse QID  . arformoterol  15 mcg Nebulization BID  . budesonide (PULMICORT) nebulizer solution  0.25 mg Nebulization BID  . cefTRIAXone (ROCEPHIN)  IV  1 g Intravenous Q24H  . chlorhexidine  15 mL Mouth Rinse BID  . cholecalciferol  2,000 Units Oral Q lunch  . digoxin  125 mcg Oral Daily  . ezetimibe  10 mg Oral Daily  . furosemide  40 mg Intravenous 3 times per day  . hydrocortisone sod succinate (SOLU-CORTEF) inj  50 mg Intravenous Q6H  . insulin aspart  2-6 Units Subcutaneous 6 times per day  . insulin glargine  10 Units Subcutaneous Daily  . levothyroxine  250 mcg Oral QAC breakfast  . magic mouthwash w/lidocaine  5 mL Oral QID  . magnesium oxide  400 mg Oral QHS  . montelukast  10 mg Oral Daily  . pantoprazole (PROTONIX) IV  40 mg Intravenous Q24H  . potassium chloride  40 mEq Per Tube BID  . pravastatin  20 mg Oral Once per day on Mon Tue Wed Thu  . sodium chloride  3 mL Intravenous Q12H  . Warfarin - Pharmacist Dosing Inpatient   Does not apply q1800  Inpatient warfarin doses administered from 11/20: 5mg  at home prior to admission, 2.5mg  (11/21), 5mg  (11/22), 0mg  (11/23-11/26)  Assessment: 84 yoF admitted with sepsis secondary to HCAP following first course of chemotherapy for NHL. On chronic  Warfarin for Afib: home dose 2.5mg  on Wed, Sat with 5mg  other days.  Admit INR 2.20, in therapeutic range.    Goal of Therapy:  INR 2-3  Today, 11/27:  INR 2.82 (from 3.2 yesterday) decreasing  Pltc 116K post-chemotherapy, improving  Hgb low,stable post-chemotherapy.  No bleeding reported  On cardiac diet-poor po intake  Received Metronidazole x 3 doses 11/22-11/23  Plan: 1. Warfarin has been held x 4 days for supratherapeutic INR. INR today is in range, no significant drug interactions, ok to resume warfarin   Give Warfarin 2.5mg  po x 1 today 2. Follow daily PT/INR 3. Follow CBC - following pltc post-chemotherapy 4. Follow clinical course and any reports of bleeding.   Kizzie Furnish, PharmD Pager: [phone] 01/25/2014 8:28 AM

## 2014-01-25 NOTE — Progress Notes (Signed)
NUTRITION FOLLOW UP  Intervention:   1.  Enteral nutrition; initiate Vital 1.2 @ 20 mL/hr continuous.  Advance by 10 mL q 4 hrs to 55 mL/hr goal to provide 1584 kcal, 99g protein, 1070 mL free water.  Nutrition Dx:   Inadequate oral intake, ongoing  Monitor:   1.  Enteral nutrition; initiation with tolerance if patient to remain intubated.  Pt to meet >/=90% estimated needs with nutrition support.  2.  Wt/wt change; monitor trends  Assessment:   Patient admitted with AMS, nausea, fever, possible LLL infiltrate on CXR.  Dx with E. Coli bacteremia.  He has a h/o follicular lymphoma, A fib, HTN, HLD, hypothyroidism, and DM.  At recent nutrition assessment, patient was would to have stable intake at weight.  Pt intubated 11/24.  Currently remains intubated.  Per RN, patient may be extubated this afternoon, however RD notes MD request for TFs.  Will place orders in chart in the event patient remains intubated and TF are warranted.  Patient is currently intubated on ventilator support MV: 9.1 L/min Temp (24hrs), Avg:98.4 F (36.9 C), Min:97.5 F (36.4 C), Max:99 F (37.2 C)  Propofol: none.  Pt is awake and alert on vent.     Height: Ht Readings from Last 1 Encounters:  01/20/14 5\' 6"  (1.676 m)    Weight Status:   Wt Readings from Last 1 Encounters:  01/25/14 162 lb 14.7 oz (73.9 kg)    Re-estimated needs:  Kcal: 1626 Protein: 80-95g Fluid: >1.8 L/day  Skin: intact  Diet Order: Diet NPO time specified   Intake/Output Summary (Last 24 hours) at 01/25/14 1254 Last data filed at 01/25/14 1000  Gross per 24 hour  Intake 899.45 ml  Output   1875 ml  Net -975.55 ml    Last BM: 11/23   Labs:   Recent Labs Lab 01/22/14 0332 01/23/14 0410 01/24/14 0430 01/25/14 0430  NA 133* 138 142 143  K 4.0 3.4* 3.3* 3.4*  CL 100 99 101 102  CO2 18* 25 28 29   BUN 59* 62* 62* 61*  CREATININE 2.30* 2.01* 1.58* 1.45*  CALCIUM 8.2* 8.0* 8.2* 8.5  MG 2.2 2.1 1.9 2.2  PHOS  4.9*  --  3.4 3.5  GLUCOSE 225* 228* 133* 129*    CBG (last 3)   Recent Labs  01/24/14 1616 01/24/14 1959 01/25/14 0352  GLUCAP 132* 106* 133*    Scheduled Meds: . acyclovir  400 mg Oral Daily  . allopurinol  300 mg Oral Daily  . antiseptic oral rinse  7 mL Mouth Rinse QID  . arformoterol  15 mcg Nebulization BID  . budesonide (PULMICORT) nebulizer solution  0.25 mg Nebulization BID  . cefTRIAXone (ROCEPHIN)  IV  1 g Intravenous Q24H  . chlorhexidine  15 mL Mouth Rinse BID  . cholecalciferol  2,000 Units Oral Q lunch  . digoxin  125 mcg Oral Daily  . ezetimibe  10 mg Oral Daily  . feeding supplement (VITAL HIGH PROTEIN)  1,000 mL Per Tube Q24H  . furosemide  40 mg Intravenous 3 times per day  . hydrocortisone sod succinate (SOLU-CORTEF) inj  50 mg Intravenous Q12H  . insulin aspart  2-6 Units Subcutaneous 6 times per day  . insulin glargine  10 Units Subcutaneous Daily  . levothyroxine  250 mcg Oral QAC breakfast  . magic mouthwash w/lidocaine  5 mL Oral QID  . magnesium oxide  400 mg Oral QHS  . montelukast  10 mg Oral Daily  .  pantoprazole (PROTONIX) IV  40 mg Intravenous Q24H  . potassium chloride  40 mEq Per Tube BID  . pravastatin  20 mg Oral Once per day on Mon Tue Wed Thu  . sodium chloride  3 mL Intravenous Q12H  . warfarin  2.5 mg Oral ONCE-1800  . Warfarin - Pharmacist Dosing Inpatient   Does not apply q1800    Continuous Infusions: . sodium chloride 10 mL/hr (01/25/14 0034)    Brynda Greathouse, MS RD LDN Clinical Inpatient Dietitian Weekend/After hours pager: [phone]

## 2014-01-25 NOTE — Progress Notes (Signed)
PULMONARY / CRITICAL CARE MEDICINE   Name: Patty [name] MRN: [id number] DOB: 1940/06/13    ADMISSION DATE:  01/18/2014 CONSULTATION DATE:  01/20/2014  REFERRING MD : Dr. Rockne Menghini  CHIEF COMPLAINT: Confusion  INITIAL PRESENTATION:  73 yo female presented with altered mental status, nausea, fever (105.1), ? LLL infiltrate on CXR. Dx w E coli bacteremia. She has hx of follicular lymphoma (first chemo on 01/09/14), A fib on coumadin, HTN, HLD, Hypothyroidism, DM.  STUDIES:  11/06 Echo >> EF 55 to 60%  SIGNIFICANT EVENTS: 11/20 Admit 11/22 Transfer to SDU with hypotension 11/23 Started on BiPAP for increased WOB in setting progressive metabolic acidosis.  11/24 worsening resp distress. 11/24 Intubated; ST depressions noted ECG, troponin negative 01/23/14 :  UOP has improved last 24h 11/26 tolerated PSV 8/5 for several hours   SUBJECTIVE/OVERNIGHT/INTERVAL HX 11/27 not passing SBT but sedated from [place]: Temp:  [97.5 F (36.4 C)-99 F (37.2 C)] 97.7 F (36.5 C) (11/27 0800) Pulse Rate:  [50-70] 54 (11/27 1000) Resp:  [13-28] 28 (11/27 1000) BP: (100-135)/(40-60) 135/40 mmHg (11/27 1000) SpO2:  [93 %-100 %] 94 % (11/27 1000) Arterial Line BP: (111-173)/(44-64) 157/44 mmHg (11/27 1000) FiO2 (%):  [30 %] 30 % (11/27 0800) Weight:  [73.9 kg (162 lb 14.7 oz)] 73.9 kg (162 lb 14.7 oz) (11/27 0616) INTAKE / OUTPUT:  Intake/Output Summary (Last 24 hours) at 01/25/14 1124 Last data filed at 01/25/14 1000  Gross per 24 hour  Intake 903.95 ml  Output   1875 ml  Net -971.05 ml    PHYSICAL EXAMINATION: General:  Weak, on vent HEENT: NCAT ETT in place PULM: CTA B CV: Irreg irreg, slow, no mgr AB: BS+, soft, nontender Ext: edema in ankles Neuro: A&Ox4, maew  LABS: PULMONARY  Recent Labs Lab 01/21/14 0300 01/21/14 0510 01/21/14 2305 01/22/14 0445 01/22/14 1315  PHART 7.256* 7.295* 7.199* 7.220* 7.340*  PCO2ART 28.5* 25.5* 34.7* 43.4 41.1  PO2ART  70.3* 87.8 92.8 74.0* 111.0*  HCO3 12.3* 12.1* 13.0* 17.2* 21.6  TCO2 11.8 11.5 12.7 16.7 20.5  O2SAT 89.1 93.8 93.7 91.0 95.4    CBC  Recent Labs Lab 01/23/14 0410 01/24/14 0430 01/25/14 0430  HGB 8.9* 8.6* 8.5*  HCT 26.0* 26.0* 27.1*  WBC 24.0* 14.2* 16.6*  PLT 100* 81* 116*    COAGULATION  Recent Labs Lab 01/21/14 0407 01/22/14 0332 01/23/14 0410 01/24/14 0430 01/25/14 0430  INR 3.00* 4.83* 4.03* 3.20* 2.82*    CARDIAC    Recent Labs Lab 01/22/14 1730 01/23/14 0010 01/23/14 0828  TROPONINI <0.30 <0.30 <0.30    Recent Labs Lab 01/20/14 1035  PROBNP 36152.0*     CHEMISTRY  Recent Labs Lab 01/21/14 1600 01/22/14 0332 01/23/14 0410 01/24/14 0430 01/25/14 0430  NA 135* 133* 138 142 143  K 4.5 4.0 3.4* 3.3* 3.4*  CL 105 100 99 101 102  CO2 17* 18* '25 28 29  ' GLUCOSE 174* 225* 228* 133* 129*  BUN 50* 59* 62* 62* 61*  CREATININE 2.28* 2.30* 2.01* 1.58* 1.45*  CALCIUM 8.3* 8.2* 8.0* 8.2* 8.5  MG  --  2.2 2.1 1.9 2.2  PHOS  --  4.9*  --  3.4 3.5   Estimated Creatinine Clearance: 34.5 mL/min (by C-G formula based on Cr of 1.45).   LIVER  Recent Labs Lab 01/18/14 1751  01/21/14 0407 01/22/14 0332 01/23/14 0410 01/24/14 0430 01/25/14 0430  AST 21  --   --   --   --   --   --  ALT 29  --   --   --   --   --   --   ALKPHOS 166*  --   --   --   --   --   --   BILITOT 0.7  --   --   --   --   --   --   PROT 6.7  --   --   --   --   --   --   ALBUMIN 3.5  --   --   --   --   --   --   INR 2.20*  < > 3.00* 4.83* 4.03* 3.20* 2.82*  < > = values in this interval not displayed.   INFECTIOUS  Recent Labs Lab 01/18/14 1758 01/20/14 1035 01/22/14 0333 01/22/14 1730 01/23/14 0410 01/24/14 0415  LATICACIDVEN 1.05 3.4* 1.0  --   --   --   PROCALCITON  --   --   --  17.55 10.93 4.81     ENDOCRINE CBG (last 3)   Recent Labs  01/24/14 1616 01/24/14 1959 01/25/14 0352  GLUCAP 132* 106* 133*         IMAGING x48h Dg Chest  Port 1 View  01/25/2014   CLINICAL DATA:  Respiratory failure, check endotracheal tube placement  EXAM: PORTABLE CHEST - 1 VIEW  COMPARISON:  01/24/2014  FINDINGS: Cardiac shadow is stable. Bibasilar density predominately related to pleural effusion is again seen slightly greater on the left than the right. A right-sided chest wall port is again noted in satisfactory position. The left central venous line is noted in the mid superior vena cava. The endotracheal tube is 4.4 cm above the carina. A nasogastric catheter is seen extending into the stomach. No acute bony abnormality is noted.  IMPRESSION: Bilateral pleural effusions left greater than right.  Tubes and lines as described.   Electronically Signed   By: Inez Catalina M.D.   On: 01/25/2014 07:21   Dg Chest Port 1 View  01/24/2014   CLINICAL DATA:  Respiratory failure.  Intubated patient.  EXAM: PORTABLE CHEST - 1 VIEW  COMPARISON:  Single view of the chest 01/22/2014 and 01/23/2014.  FINDINGS: Support tubes and lines are unchanged. Bilateral effusions and airspace disease persist and have worsened over the past two days. There is cardiomegaly. No pneumothorax.  IMPRESSION: Worsened bilateral pleural effusions and airspace disease.   Electronically Signed   By: Inge Rise M.D.   On: 01/24/2014 08:36        ASSESSMENT / PLAN:  PULMONARY A: ETT 11/24 >> Acute respiratory failure initially due to inability to compensate for met acedosis, 11/27 pulmonary edema  P:   PSV as tolerated 11/27, may be able to extubate Diurese again today  CARDIOVASCULAR Port A:  Severe sepsis, septic shock> resolved History of Afib/HTN/HLD Pulmonary edema> due to aggressive volume resuscitation, echo 11/6 OK Bradycardia 11/27 > due to precedex, stable BP and UOP P:  Continue lasix 30m IV q8h Continue digoxin, check level prn given renal insufficiency Continue zetia, pravastatin Coumadin per pharm D/c precedex  RENAL A:   Acute oliguric  renal failure> improving Mild low K and low mag 01/24/14  P:   Replete mag and k Continue scheduled potassium replacement  GASTROINTESTINAL A:   Hx of GERD. 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 Lymphocytosis >> received neulasta recently as part of chemo  regimen.  Current Anemia, - unchagned  thrombocytopenia. - improved  P:  F/u CBC; PRBC for hgb  < 7gm% Continue allopurinol after chemo for lymphoma  INFECTIOUS  Blood cx 11/20 >> E coli Urine cx 11/20 >> E coli/klebsiella  C diff PCR 11/21 >> negative Blood cx 11/22 >>    A:   Severe sepsis from UTI, E coli bacteremia. Diarrhea developed 11/21. C diff negative 11/22 P:   Vanco 11/21 >> 11/23 Cefepime 11/21 >> 11/23 Ceftriaxone 11/23 >> plan 14 days   ENDOCRINE A:   Hx of DM type II, hypothyroidism. Relative adrenal insufficiency >> received decadron with chemo. P:   SSI Continue synthroid wean solu cortef 11/27  NEUROLOGIC A:   Anxiety/Confusion 01/22/14  P:   Sedation per PAD protocol with bolus fentanyl and prn ativan titrated to RASS -1   FAMILY   - Updates: patient, husband, daughter updated in full 11/27  - Inter-disciplinary family meet or Palliative Care meeting due by 01/27/14   TODAY'S SUMMARY:  E coli bacteremia and septic shock, ARF, acute resp failure all improving but still weak and volume overload with pulmonary edema due to aggressive volume resuscitation.  11/27 plan diuresis, start tube feedings  CC time by me 35 minutes  Roselie Awkward, MD [place] Pager: [phone] Cell: [phone] If no response, call [phone]  01/25/2014 11:24 AM

## 2014-01-25 NOTE — Progress Notes (Signed)
RT spoke with RN in regards to patient needing an order for nocturnal CPAP per home use.  RN stated that pt has been very nauseated and has received several medications for nausea.  RN stated that she wanted to hold off on CPAP at this time and RT is in agreement.  Pt is currently on 4 LPM [place] and resting comfortably.  RN to notify RT if/when she thinks Pt can tolerated CPAP.  RT to monitor and assess as needed.

## 2014-01-26 ENCOUNTER — Inpatient Hospital Stay (HOSPITAL_COMMUNITY): Payer: Medicare Other

## 2014-01-26 LAB — GLUCOSE, CAPILLARY
GLUCOSE-CAPILLARY: 110 mg/dL — AB (ref 70–99)
GLUCOSE-CAPILLARY: 163 mg/dL — AB (ref 70–99)
Glucose-Capillary: 98 mg/dL (ref 70–99)
Glucose-Capillary: 114 mg/dL — ABNORMAL HIGH (ref 70–99)
Glucose-Capillary: 171 mg/dL — ABNORMAL HIGH (ref 70–99)

## 2014-01-26 LAB — PROTIME-INR
INR: 2.78 — AB (ref 0.00–1.49)
PROTHROMBIN TIME: 29.5 s — AB (ref 11.6–15.2)

## 2014-01-26 LAB — CBC WITH DIFFERENTIAL/PLATELET
Basophils Absolute: 0 10*3/uL (ref 0.0–0.1)
Basophils Relative: 0 % (ref 0–1)
Eosinophils Absolute: 0.7 10*3/uL (ref 0.0–0.7)
Eosinophils Relative: 4 % (ref 0–5)
HEMATOCRIT: 27.8 % — AB (ref 36.0–46.0)
HEMOGLOBIN: 8.7 g/dL — AB (ref 12.0–15.0)
LYMPHS PCT: 3 % — AB (ref 12–46)
Lymphs Abs: 0.5 10*3/uL — ABNORMAL LOW (ref 0.7–4.0)
MCH: 27.4 pg (ref 26.0–34.0)
MCHC: 31.3 g/dL (ref 30.0–36.0)
MCV: 87.7 fL (ref 78.0–100.0)
MONO ABS: 1.3 10*3/uL — AB (ref 0.1–1.0)
MONOS PCT: 8 % (ref 3–12)
NEUTROS ABS: 14.9 10*3/uL — AB (ref 1.7–7.7)
NEUTROS PCT: 86 % — AB (ref 43–77)
Platelets: 192 10*3/uL (ref 150–400)
RBC: 3.17 MIL/uL — AB (ref 3.87–5.11)
RDW: 15.4 % (ref 11.5–15.5)
WBC: 17.3 10*3/uL — AB (ref 4.0–10.5)

## 2014-01-26 LAB — CULTURE, BLOOD (ROUTINE X 2)
CULTURE: NO GROWTH
Culture: NO GROWTH

## 2014-01-26 LAB — BASIC METABOLIC PANEL
ANION GAP: 14 (ref 5–15)
BUN: 52 mg/dL — AB (ref 6–23)
CO2: 31 meq/L (ref 19–32)
CREATININE: 1.24 mg/dL — AB (ref 0.50–1.10)
Calcium: 8.6 mg/dL (ref 8.4–10.5)
Chloride: 103 mEq/L (ref 96–112)
GFR calc non Af Amer: 41 mL/min — ABNORMAL LOW (ref >90)
GFR, EST AFRICAN AMERICAN: 48 mL/min — AB (ref >90)
Glucose, Bld: 117 mg/dL — ABNORMAL HIGH (ref 70–99)
POTASSIUM: 2.8 meq/L — AB (ref 3.7–5.3)
Sodium: 148 mEq/L — ABNORMAL HIGH (ref 137–147)

## 2014-01-26 LAB — MAGNESIUM: MAGNESIUM: 1.8 mg/dL (ref 1.5–2.5)

## 2014-01-26 LAB — PHOSPHORUS: Phosphorus: 3.7 mg/dL (ref 2.3–4.6)

## 2014-01-26 LAB — LACTIC ACID, PLASMA: LACTIC ACID, VENOUS: 1.3 mmol/L (ref 0.5–2.2)

## 2014-01-26 LAB — TROPONIN I

## 2014-01-26 LAB — LIPASE, BLOOD: LIPASE: 8 U/L — AB (ref 11–59)

## 2014-01-26 MED ORDER — MAGNESIUM SULFATE 2 GM/50ML IV SOLN
2.0000 g | Freq: Once | INTRAVENOUS | Status: AC
  Administered 2014-01-26: 2 g via INTRAVENOUS
  Filled 2014-01-26: qty 50

## 2014-01-26 MED ORDER — WARFARIN SODIUM 2.5 MG PO TABS
2.5000 mg | ORAL_TABLET | Freq: Once | ORAL | Status: AC
  Administered 2014-01-26: 2.5 mg via ORAL
  Filled 2014-01-26: qty 1

## 2014-01-26 MED ORDER — FUROSEMIDE 10 MG/ML IJ SOLN
40.0000 mg | Freq: Two times a day (BID) | INTRAMUSCULAR | Status: DC
  Administered 2014-01-26 – 2014-01-27 (×2): 40 mg via INTRAVENOUS
  Filled 2014-01-26 (×2): qty 4

## 2014-01-26 MED ORDER — METOCLOPRAMIDE HCL 5 MG/ML IJ SOLN
5.0000 mg | INTRAMUSCULAR | Status: AC
  Administered 2014-01-26: 5 mg via INTRAVENOUS
  Filled 2014-01-26: qty 2

## 2014-01-26 MED ORDER — POTASSIUM CHLORIDE 10 MEQ/50ML IV SOLN
10.0000 meq | INTRAVENOUS | Status: AC
  Administered 2014-01-26 (×2): 10 meq via INTRAVENOUS
  Filled 2014-01-26 (×2): qty 50

## 2014-01-26 MED ORDER — POTASSIUM CHLORIDE 10 MEQ/50ML IV SOLN
10.0000 meq | INTRAVENOUS | Status: AC
  Administered 2014-01-26 (×3): 10 meq via INTRAVENOUS
  Filled 2014-01-26 (×3): qty 50

## 2014-01-26 MED ORDER — TRAMADOL-ACETAMINOPHEN 37.5-325 MG PO TABS
1.0000 | ORAL_TABLET | Freq: Four times a day (QID) | ORAL | Status: DC | PRN
  Administered 2014-01-27 – 2014-01-28 (×2): 1 via ORAL
  Filled 2014-01-26 (×3): qty 1

## 2014-01-26 NOTE — Progress Notes (Signed)
  RN ccalling elink  Severe nausea despite zofran and phenergan  Plan Check trop, lipase, lactate reglan 5mg  IV Replace mag (currently 1.8) and k (K 2.8 and partially replaced earlier)  Dr. Brand Males, M.D., [facility].C.P Pulmonary and Critical Care Medicine Staff Physician [facility] Pager: [phone], If no answer or between  15:00h - 7:00h: call [phone]  01/26/2014 4:02 PM

## 2014-01-26 NOTE — Progress Notes (Signed)
eLink Physician-Brief Progress Note Patient Name: Patty [name] DOB: [birth year] MRN: [id number]   Date of Service  01/26/2014  HPI/Events of Note  Pain, on chronic untracet  eICU Interventions  Get am lft Add hom med     Intervention Category Major Interventions: Code management / supervision Minor Interventions: Routine modifications to care plan (e.g. PRN medications for pain, fever)  Raylene Miyamoto. 01/26/2014, 11:43 PM

## 2014-01-26 NOTE — Progress Notes (Signed)
ANTICOAGULATION CONSULT NOTE - Follow Up  Pharmacy Consult for Warfarin Indication: atrial fibrillation  Allergies  Allergen Reactions  . Morphine And Related Anaphylaxis and Other (See Comments)    Pt states she stopped breathing post op- "went into resp. arrest"  . Demerol Nausea And Vomiting  . Meperidine Hcl Nausea And Vomiting  . Multaq [Dronedarone] Other (See Comments)    Reaction:  Blood in urine and elevated liver enzymes.   Jeanie Cooks Allergy] Rash  . Penicillins Itching and Rash  . Sulfa Drugs Cross Reactors Rash   Patient Measurements: Height: 5\' 6"  (167.6 cm) Weight: 171 lb 15.3 oz (78 kg) IBW/kg (Calculated) : 59.3  Vital Signs: Temp: 98.3 F (36.8 C) (11/28 0400) Temp Source: Oral (11/28 0400) BP: 174/73 mmHg (11/28 0600) Pulse Rate: 70 (11/28 0600)  Labs:  Recent Labs  01/23/14 0828  01/24/14 0430 01/25/14 0430 01/26/14 0445  HGB  --   < > 8.6* 8.5* 8.7*  HCT  --   --  26.0* 27.1* 27.8*  PLT  --   --  81* 116* 192  LABPROT  --   --  33.0* 29.9* 29.5*  INR  --   --  3.20* 2.82* 2.78*  CREATININE  --   --  1.58* 1.45* 1.24*  TROPONINI <0.30  --   --   --   --   < > = values in this interval not displayed. Estimated Creatinine Clearance: 41.3 mL/min (by C-G formula based on Cr of 1.24).  Medical History: Past Medical History  Diagnosis Date  . Asthma 06/15/2010  . Hypothyroidism 06/15/2010  . Hyperlipemia 06/15/2010  . Atrial fibrillation 05/28/2010    Managed with rate control and coumadin  . Long-term (current) use of anticoagulants   . ACE-inhibitor cough   . DI (detrusor instability)   . Fibroid   . Rectocele   . Atrophic vaginitis   . 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 Diabetes mellitus     Type 2  . Peripheral neuropathy   . Hypertension   . Dysrhythmia     cardioversion - 2012  . Complication of anesthesia 1980's    post anesth.- states she "went into resp. arrest" , but then remarked that she thought maybe they  gave her too much medicine (morphine)   . Shortness of breath     sometimes   . 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 Anemia   . Malignant lymphoma, follicular 37/1/69  . CPAP (continuous positive airway pressure) dependence   . GERD (gastroesophageal reflux disease)   . Hearing loss   . Sleep apnea     uses CPAP everynight- last study in [place] 5 yrs. or more   . Severe sepsis with septic shock 01/21/2014  . E coli bacteremia 01/20/2014   Medications:  Scheduled:  . acyclovir  400 mg Oral Daily  . allopurinol  300 mg Oral Daily  . antiseptic oral rinse  7 mL Mouth Rinse QID  . arformoterol  15 mcg Nebulization BID  . budesonide (PULMICORT) nebulizer solution  0.25 mg Nebulization BID  . cefTRIAXone (ROCEPHIN)  IV  1 g Intravenous Q24H  . chlorhexidine  15 mL Mouth Rinse BID  . cholecalciferol  2,000 Units Oral Q lunch  . digoxin  125 mcg Oral Daily  . ezetimibe  10 mg Oral Daily  . furosemide  40 mg Intravenous 3 times per day  . hydrocortisone sod succinate (SOLU-CORTEF) inj  50 mg  Intravenous Q12H  . insulin aspart  2-6 Units Subcutaneous 6 times per day  . insulin glargine  10 Units Subcutaneous Daily  . levothyroxine  250 mcg Oral QAC breakfast  . magic mouthwash w/lidocaine  5 mL Oral QID  . magnesium oxide  400 mg Oral QHS  . montelukast  10 mg Oral Daily  . pantoprazole (PROTONIX) IV  40 mg Intravenous Q24H  . potassium chloride  10 mEq Intravenous Q1 Hr x 3  . potassium chloride  40 mEq Per Tube BID  . pravastatin  20 mg Oral Once per day on Mon Tue Wed Thu  . sodium chloride  3 mL Intravenous Q12H  . Warfarin - Pharmacist Dosing Inpatient   Does not apply q1800  Inpatient warfarin doses administered from 11/20: 5mg  at home prior to admission, 2.5mg  (11/21), 5mg  (11/22), 0mg  (11/23-11/26), 2.5mg  (11/27)  Assessment: 29 yoF admitted with sepsis secondary to HCAP following first course of chemotherapy for NHL. On chronic Warfarin for Afib: home dose 2.5mg  on Wed,  Sat with 5mg  other days.  Admit INR 2.20, in therapeutic range.    Goal of Therapy:  INR 2-3  Today, 11/27:  INR 2.78  Pltc 192K post-chemotherapy, improving  Hgb low,stable post-chemotherapy.  No bleeding reported  On cardiac diet-poor po intake  Received Metronidazole x 3 doses 11/22-11/23  Diet: NPO  Plan:  1.Give Warfarin 2.5mg  po x 1 today 2. Follow daily PT/INR 3. Follow CBC - following pltc post-chemotherapy 4. Follow clinical course and any reports of bleeding.   Kizzie Furnish, PharmD Pager: [phone] 01/26/2014 7:14 AM

## 2014-01-26 NOTE — Progress Notes (Signed)
PULMONARY / CRITICAL CARE MEDICINE   Name: Patty [name] MRN: [id number] DOB: 01/24/1941    ADMISSION DATE:  01/18/2014 CONSULTATION DATE:  01/20/2014  REFERRING MD : Dr. Rockne Menghini  CHIEF COMPLAINT: Confusion  INITIAL PRESENTATION:  73 yo female presented with altered mental status, nausea, fever (105.1), ? LLL infiltrate on CXR. Dx w E coli bacteremia. She has hx of follicular lymphoma (first chemo on 01/09/14), A fib on coumadin, HTN, HLD, Hypothyroidism, DM.  STUDIES:  11/06 Echo >> EF 55 to 60%  SIGNIFICANT EVENTS: 11/20 Admit 11/22 Transfer to SDU with hypotension 11/23 Started on BiPAP for increased WOB in setting progressive metabolic acidosis.  11/24 worsening resp distress. 11/24 Intubated; ST depressions noted ECG, troponin negative 01/23/14 :  UOP has improved last 24h 11/26 tolerated PSV 8/5 for several hours   SUBJECTIVE/OVERNIGHT/INTERVAL HX Extubated 11/27 Up to chair Feels better   VITAL SIGNS: Temp:  [97.8 F (36.6 C)-98.9 F (37.2 C)] 97.8 F (36.6 C) (11/28 0800) Pulse Rate:  [54-91] 82 (11/28 0900) Resp:  [21-39] 23 (11/28 0900) BP: (133-174)/(40-96) 167/57 mmHg (11/28 0837) SpO2:  [93 %-100 %] 95 % (11/28 0900) Arterial Line BP: (136-157)/(44-45) 149/45 mmHg (11/27 1200) FiO2 (%):  [30 %] 30 % (11/27 1119) Weight:  [78 kg (171 lb 15.3 oz)] 78 kg (171 lb 15.3 oz) (11/28 0400) INTAKE / OUTPUT:  Intake/Output Summary (Last 24 hours) at 01/26/14 0907 Last data filed at 01/26/14 0900  Gross per 24 hour  Intake  700.6 ml  Output   5530 ml  Net -4829.4 ml    PHYSICAL EXAMINATION: General:  Up to chair, stronger HEENT: NCAT  OP clear PULM: CTA B CV: Irreg irreg, slow, no mgr AB: BS+, soft, nontender Ext: edema in ankles Neuro: A&Ox4, maew  LABS: PULMONARY  Recent Labs Lab 01/21/14 0300 01/21/14 0510 01/21/14 2305 01/22/14 0445 01/22/14 1315  PHART 7.256* 7.295* 7.199* 7.220* 7.340*  PCO2ART 28.5* 25.5* 34.7* 43.4 41.1  PO2ART  70.3* 87.8 92.8 74.0* 111.0*  HCO3 12.3* 12.1* 13.0* 17.2* 21.6  TCO2 11.8 11.5 12.7 16.7 20.5  O2SAT 89.1 93.8 93.7 91.0 95.4    CBC  Recent Labs Lab 01/24/14 0430 01/25/14 0430 01/26/14 0445  HGB 8.6* 8.5* 8.7*  HCT 26.0* 27.1* 27.8*  WBC 14.2* 16.6* 17.3*  PLT 81* 116* 192    COAGULATION  Recent Labs Lab 01/22/14 0332 01/23/14 0410 01/24/14 0430 01/25/14 0430 01/26/14 0445  INR 4.83* 4.03* 3.20* 2.82* 2.78*    CARDIAC    Recent Labs Lab 01/22/14 1730 01/23/14 0010 01/23/14 0828  TROPONINI <0.30 <0.30 <0.30    Recent Labs Lab 01/20/14 1035  PROBNP 36152.0*     CHEMISTRY  Recent Labs Lab 01/22/14 0332 01/23/14 0410 01/24/14 0430 01/25/14 0430 01/26/14 0445  NA 133* 138 142 143 148*  K 4.0 3.4* 3.3* 3.4* 2.8*  CL 100 99 101 102 103  CO2 18* '25 28 29 31  ' GLUCOSE 225* 228* 133* 129* 117*  BUN 59* 62* 62* 61* 52*  CREATININE 2.30* 2.01* 1.58* 1.45* 1.24*  CALCIUM 8.2* 8.0* 8.2* 8.5 8.6  MG 2.2 2.1 1.9 2.2 1.8  PHOS 4.9*  --  3.4 3.5 3.7   Estimated Creatinine Clearance: 41.3 mL/min (by C-G formula based on Cr of 1.24).   LIVER  Recent Labs Lab 01/22/14 0332 01/23/14 0410 01/24/14 0430 01/25/14 0430 01/26/14 0445  INR 4.83* 4.03* 3.20* 2.82* 2.78*     INFECTIOUS  Recent Labs Lab 01/20/14 1035 01/22/14 0333 01/22/14  1730 01/23/14 0410 01/24/14 0415  LATICACIDVEN 3.4* 1.0  --   --   --   PROCALCITON  --   --  17.55 10.93 4.81     ENDOCRINE CBG (last 3)   Recent Labs  01/25/14 2306 01/26/14 0340 01/26/14 0807  GLUCAP 98 114* 110*         IMAGING x48h Dg Chest Port 1 View  01/26/2014   CLINICAL DATA:  Subsequent evaluation of acute respiratory failure with hypoxia  EXAM: PORTABLE CHEST - 1 VIEW  COMPARISON:  01/25/2014  FINDINGS: Right Port-A-Cath stable. Left internal jugular central line appears to have been retracted by about 2.5 cm with tip now over the junction of the left brachiocephalic vein and  superior vena cava.  Stable mild cardiac enlargement. Extensive bilateral lower lobe opacities are stable. The hazy appearance suggests pleural effusions left greater than right with underlying airspace disease.  IMPRESSION: Stable bilateral lower lobe opacities   Electronically Signed   By: Skipper Cliche M.D.   On: 01/26/2014 08:22   Dg Chest Port 1 View  01/25/2014   CLINICAL DATA:  Respiratory failure, check endotracheal tube placement  EXAM: PORTABLE CHEST - 1 VIEW  COMPARISON:  01/24/2014  FINDINGS: Cardiac shadow is stable. Bibasilar density predominately related to pleural effusion is again seen slightly greater on the left than the right. A right-sided chest wall port is again noted in satisfactory position. The left central venous line is noted in the mid superior vena cava. The endotracheal tube is 4.4 cm above the carina. A nasogastric catheter is seen extending into the stomach. No acute bony abnormality is noted.  IMPRESSION: Bilateral pleural effusions left greater than right.  Tubes and lines as described.   Electronically Signed   By: Inez Catalina M.D.   On: 01/25/2014 07:21        ASSESSMENT / PLAN:  PULMONARY A: ETT 11/24 >> 11/27 Acute respiratory failure initially due to inability to compensate for met acedosis,  pulmonary edema, resolved  P:   Tolerated extubation pulm hygiene  CARDIOVASCULAR Port A:  Severe sepsis, septic shock> resolved History of Afib/HTN/HLD Pulmonary edema> due to aggressive volume resuscitation, echo 11/6 [place], improved Bradycardia, resolved P:  Decrease lasix to 31m IV q12 Continue digoxin, check level prn given renal insufficiency Continue zetia, pravastatin Coumadin per pharm  RENAL A:   Acute oliguric renal failure> improving Hypokalemia Hypomagnesemia   P:   Replete mag and k Continue scheduled potassium replacement  GASTROINTESTINAL A:   Hx of GERD. P:   PPI for SUP Start diet   HEMATOLOGIC A:   Stage III  follicular lymphoma s/p chemo 01/09/14. Lymphocytosis >> received neulasta recently as part of chemo regimen.  Current Anemia, - unchagned  thrombocytopenia. - improved  P:  F/u CBC; PRBC for hgb  < 7gm% Continue allopurinol after chemo for lymphoma  INFECTIOUS  Blood cx 11/20 >> E coli Urine cx 11/20 >> E coli/klebsiella  C diff PCR 11/21 >> negative Blood cx 11/22 >>    A:   Severe sepsis from UTI, E coli bacteremia. Diarrhea developed 11/21. C diff negative 11/22 P:   Vanco 11/21 >> 11/23 Cefepime 11/21 >> 11/23 Ceftriaxone 11/23 >> plan 14 days abx total (day 8 on 11/28)   ENDOCRINE A:   Hx of DM type II, hypothyroidism. Relative adrenal insufficiency >> received decadron with chemo. P:   SSI Continue synthroid D/c hydrocort 11/28  NEUROLOGIC A:   Anxiety/Confusion 01/22/14  P:  Minimize sedating meds   FAMILY   - Updates: patient, husband, daughter updated in full 11/28  - Inter-disciplinary family meet or Palliative Care meeting due by 01/27/14   TODAY'S SUMMARY:  E coli bacteremia and septic shock, ARF, acute resp failure all improving  Baltazar Apo, MD, PhD 01/26/2014, 9:14 AM [facility] [phone] or if no answer [phone]

## 2014-01-27 LAB — GLUCOSE, CAPILLARY
GLUCOSE-CAPILLARY: 156 mg/dL — AB (ref 70–99)
GLUCOSE-CAPILLARY: 132 mg/dL — AB (ref 70–99)
GLUCOSE-CAPILLARY: 119 mg/dL — AB (ref 70–99)
Glucose-Capillary: 179 mg/dL — ABNORMAL HIGH (ref 70–99)
Glucose-Capillary: 137 mg/dL — ABNORMAL HIGH (ref 70–99)

## 2014-01-27 LAB — COMPREHENSIVE METABOLIC PANEL
ALBUMIN: 2.5 g/dL — AB (ref 3.5–5.2)
ALK PHOS: 67 U/L (ref 39–117)
ALT: 15 U/L (ref 0–35)
ANION GAP: 6 (ref 5–15)
AST: 15 U/L (ref 0–37)
BUN: 33 mg/dL — AB (ref 6–23)
CO2: 40 mEq/L (ref 19–32)
CREATININE: 0.97 mg/dL (ref 0.50–1.10)
Calcium: 8.7 mg/dL (ref 8.4–10.5)
Chloride: 104 mEq/L (ref 96–112)
GFR calc Af Amer: 65 mL/min — ABNORMAL LOW (ref >90)
GFR calc non Af Amer: 56 mL/min — ABNORMAL LOW (ref >90)
Glucose, Bld: 141 mg/dL — ABNORMAL HIGH (ref 70–99)
POTASSIUM: 3 meq/L — AB (ref 3.7–5.3)
Sodium: 150 mEq/L — ABNORMAL HIGH (ref 137–147)
Total Bilirubin: 0.4 mg/dL (ref 0.3–1.2)
Total Protein: 5.2 g/dL — ABNORMAL LOW (ref 6.0–8.3)

## 2014-01-27 LAB — CBC
HCT: 27.6 % — ABNORMAL LOW (ref 36.0–46.0)
Hemoglobin: 8.4 g/dL — ABNORMAL LOW (ref 12.0–15.0)
MCH: 27.4 pg (ref 26.0–34.0)
MCHC: 30.4 g/dL (ref 30.0–36.0)
MCV: 89.9 fL (ref 78.0–100.0)
PLATELETS: 197 10*3/uL (ref 150–400)
RBC: 3.07 MIL/uL — ABNORMAL LOW (ref 3.87–5.11)
RDW: 15.3 % (ref 11.5–15.5)
WBC: 14.6 10*3/uL — AB (ref 4.0–10.5)

## 2014-01-27 LAB — TROPONIN I: Troponin I: <0.3 ng/mL (ref <0.30)

## 2014-01-27 LAB — PHOSPHORUS: Phosphorus: 2.4 mg/dL (ref 2.3–4.6)

## 2014-01-27 LAB — PROTIME-INR
INR: 3.82 — AB (ref 0.00–1.49)
PROTHROMBIN TIME: 37.9 s — AB (ref 11.6–15.2)

## 2014-01-27 LAB — MAGNESIUM: Magnesium: 1.9 mg/dL (ref 1.5–2.5)

## 2014-01-27 MED ORDER — POTASSIUM CHLORIDE 10 MEQ/100ML IV SOLN
10.0000 meq | INTRAVENOUS | Status: AC
  Administered 2014-01-27 (×3): 10 meq via INTRAVENOUS
  Filled 2014-01-27 (×3): qty 100

## 2014-01-27 MED ORDER — TRIAMTERENE-HCTZ 37.5-25 MG PO TABS
1.0000 | ORAL_TABLET | Freq: Every morning | ORAL | Status: DC
  Administered 2014-01-28 – 2014-01-29 (×2): 1 via ORAL
  Filled 2014-01-27 (×4): qty 1

## 2014-01-27 MED ORDER — POTASSIUM CHLORIDE CRYS ER 20 MEQ PO TBCR
40.0000 meq | EXTENDED_RELEASE_TABLET | Freq: Two times a day (BID) | ORAL | Status: DC
  Administered 2014-01-27 – 2014-01-28 (×2): 40 meq via ORAL
  Filled 2014-01-27 (×5): qty 2

## 2014-01-27 MED ORDER — POTASSIUM CHLORIDE CRYS ER 20 MEQ PO TBCR
40.0000 meq | EXTENDED_RELEASE_TABLET | ORAL | Status: DC
  Filled 2014-01-27: qty 2

## 2014-01-27 MED ORDER — SODIUM CHLORIDE 0.9 % IJ SOLN
10.0000 mL | Freq: Two times a day (BID) | INTRAMUSCULAR | Status: DC
  Administered 2014-01-27 – 2014-01-30 (×7): 10 mL via INTRAVENOUS

## 2014-01-27 MED ORDER — ONDANSETRON 8 MG/NS 50 ML IVPB
8.0000 mg | Freq: Four times a day (QID) | INTRAVENOUS | Status: DC | PRN
  Administered 2014-01-27 – 2014-01-31 (×12): 8 mg via INTRAVENOUS
  Filled 2014-01-27 (×23): qty 8

## 2014-01-27 MED ORDER — HYDRALAZINE HCL 20 MG/ML IJ SOLN
10.0000 mg | Freq: Four times a day (QID) | INTRAMUSCULAR | Status: DC | PRN
  Administered 2014-01-27 – 2014-01-28 (×2): 10 mg via INTRAVENOUS
  Filled 2014-01-27 (×2): qty 1

## 2014-01-27 NOTE — Progress Notes (Addendum)
PULMONARY / CRITICAL CARE MEDICINE   Name: Patty [name] MRN: [id number] DOB: [birth year]    ADMISSION DATE:  01/18/2014 CONSULTATION DATE:  01/20/2014  REFERRING MD : Dr. Rockne Menghini  CHIEF COMPLAINT: Confusion  INITIAL PRESENTATION:  73 yo female presented with altered mental status, nausea, fever (105.1), ? LLL infiltrate on CXR. Dx w E coli bacteremia. She has hx of follicular lymphoma (first chemo on 01/09/14), A fib on coumadin, HTN, HLD, Hypothyroidism, DM.  STUDIES:  11/06 Echo >> EF 55 to 60%  SIGNIFICANT EVENTS: 11/20 Admit 11/22 Transfer to SDU with hypotension 11/23 Started on BiPAP for increased WOB in setting progressive metabolic acidosis.  11/24 worsening resp distress. 11/24 Intubated; ST depressions noted ECG, troponin negative 01/23/14 :  UOP has improved last 24h 11/26 tolerated PSV 8/5 for several hours   SUBJECTIVE/OVERNIGHT/INTERVAL HX Having severe nausea, was present even before this hospitalization Hemodynamically stable, actually a bit hypertensive Poor PO   VITAL SIGNS: Temp:  [98.1 F (36.7 C)-99.1 F (37.3 C)] 98.1 F (36.7 C) (11/29 0400) Pulse Rate:  [63-116] 77 (11/29 0819) Resp:  [16-26] 17 (11/29 0819) BP: (140-187)/(34-73) 187/67 mmHg (11/29 0819) SpO2:  [89 %-97 %] 97 % (11/29 0819) Weight:  [68.2 kg (150 lb 5.7 oz)] 68.2 kg (150 lb 5.7 oz) (11/29 0400) INTAKE / OUTPUT:  Intake/Output Summary (Last 24 hours) at 01/27/14 1004 Last data filed at 01/27/14 0600  Gross per 24 hour  Intake   1150 ml  Output   3880 ml  Net  -2730 ml    PHYSICAL EXAMINATION: General:  Ill appearing, uncomfortable due to her nausea HEENT: NCAT  OP clear PULM: CTA B CV: Irreg irreg, slow, no mgr AB: BS+, soft, nontender Ext: edema in ankles Neuro: A&Ox4, maew  LABS: PULMONARY  Recent Labs Lab 01/21/14 0300 01/21/14 0510 01/21/14 2305 01/22/14 0445 01/22/14 1315  PHART 7.256* 7.295* 7.199* 7.220* 7.340*  PCO2ART 28.5* 25.5* 34.7* 43.4 41.1   PO2ART 70.3* 87.8 92.8 74.0* 111.0*  HCO3 12.3* 12.1* 13.0* 17.2* 21.6  TCO2 11.8 11.5 12.7 16.7 20.5  O2SAT 89.1 93.8 93.7 91.0 95.4    CBC  Recent Labs Lab 01/25/14 0430 01/26/14 0445 01/27/14 0400  HGB 8.5* 8.7* 8.4*  HCT 27.1* 27.8* 27.6*  WBC 16.6* 17.3* 14.6*  PLT 116* 192 197    COAGULATION  Recent Labs Lab 01/23/14 0410 01/24/14 0430 01/25/14 0430 01/26/14 0445 01/27/14 0400  INR 4.03* 3.20* 2.82* 2.78* 3.82*    CARDIAC    Recent Labs Lab 01/23/14 0010 01/23/14 0828 01/26/14 1650 01/26/14 2300 01/27/14 0801  TROPONINI <0.30 <0.30 <0.30 <0.30 <0.30    Recent Labs Lab 01/20/14 1035  PROBNP 36152.0*     CHEMISTRY  Recent Labs Lab 01/22/14 0332 01/23/14 0410 01/24/14 0430 01/25/14 0430 01/26/14 0445 01/27/14 0400  NA 133* 138 142 143 148* 150*  K 4.0 3.4* 3.3* 3.4* 2.8* 3.0*  CL 100 99 101 102 103 104  CO2 18* _0 40*  GLUCOSE 225* 228* 133* 129* 117* 141*  BUN 59* 62* 62* 61* 52* 33*  CREATININE 2.30* 2.01* 1.58* 1.45* 1.24* 0.97  CALCIUM 8.2* 8.0* 8.2* 8.5 8.6 8.7  MG 2.2 2.1 1.9 2.2 1.8 1.9  PHOS 4.9*  --  3.4 3.5 3.7 2.4   Estimated Creatinine Clearance: 46.9 mL/min (by C-G formula based on Cr of 0.97).   LIVER  Recent Labs Lab 01/23/14 0410 01/24/14 0430 01/25/14 0430 01/26/14 0445 01/27/14 0400  AST  --   --   --   --  15  ALT  --   --   --   --  15  ALKPHOS  --   --   --   --  67  BILITOT  --   --   --   --  0.4  PROT  --   --   --   --  5.2*  ALBUMIN  --   --   --   --  2.5*  INR 4.03* 3.20* 2.82* 2.78* 3.82*     INFECTIOUS  Recent Labs Lab 01/20/14 1035 01/22/14 0333 01/22/14 1730 01/23/14 0410 01/24/14 0415 01/26/14 1650  LATICACIDVEN 3.4* 1.0  --   --   --  1.3  PROCALCITON  --   --  17.55 10.93 4.81  --      ENDOCRINE CBG (last 3)   Recent Labs  01/27/14 0025 01/27/14 0403 01/27/14 0844  GLUCAP 179* 132* 119*         IMAGING x48h Dg Chest Port 1 View  01/26/2014    CLINICAL DATA:  Subsequent evaluation of acute respiratory failure with hypoxia  EXAM: PORTABLE CHEST - 1 VIEW  COMPARISON:  01/25/2014  FINDINGS: Right Port-A-Cath stable. Left internal jugular central line appears to have been retracted by about 2.5 cm with tip now over the junction of the left brachiocephalic vein and superior vena cava.  Stable mild cardiac enlargement. Extensive bilateral lower lobe opacities are stable. The hazy appearance suggests pleural effusions left greater than right with underlying airspace disease.  IMPRESSION: Stable bilateral lower lobe opacities   Electronically Signed   By: Skipper Cliche M.D.   On: 01/26/2014 08:22        ASSESSMENT / PLAN:  PULMONARY A: ETT 11/24 >> 11/27 Acute respiratory failure initially due to inability to compensate for met acedosis,  pulmonary edema, resolved OSA P:   Tolerated extubation Continue nocturnal CPAP pulm hygiene Should be able to back off on aggressive diuresis at this point  CARDIOVASCULAR Port A:  Severe sepsis, septic shock> resolved History of Afib/HTN/HLD Pulmonary edema> due to aggressive volume resuscitation, echo 11/6 OK, improved Bradycardia, resolved P:  Stop lasix 11/29 (note good overall diuresis) Continue digoxin, check level prn given renal insufficiency Continue zetia, pravastatin Coumadin per pharm Restart home Maxzide 11/29  RENAL A:   Acute oliguric renal failure> normalizing Hypokalemia Hypomagnesemia  Hypernatremia P:   Replete mag and k Continue scheduled potassium replacement D/c diuresis 11/29 given evolving hypernatremia  GASTROINTESTINAL A:   Hx of GERD. 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 Lymphocytosis >> received neulasta recently as part of chemo regimen.  P:  F/u CBC; PRBC for hgb  < 7gm% Continue allopurinol after chemo for lymphoma,? Duration suspect minimal  risk tumor lysis at this time.   INFECTIOUS  Blood cx 11/20 >> E coli Urine cx 11/20 >> E coli/klebsiella  C diff PCR 11/21 >> negative Blood cx 11/22 >> negative  A:   Severe sepsis from E coli + Klebsiella UTI, E coli bacteremia. Diarrhea developed 11/21. C diff negative 11/22 P:   Vanco 11/21 >> 11/23 Cefepime 11/21 >> 11/23 Ceftriaxone 11/23 >> plan 14 days abx total (day 9 on 11/29)   ENDOCRINE A:   Hx of DM type II, hypothyroidism. Relative adrenal insufficiency >> received decadron with chemo. P:   SSI Continue synthroid D/c'd hydrocort 11/28  NEUROLOGIC A:   Anxiety/Confusion  01/22/14, resolved  P:   Minimize sedating meds   FAMILY   - Updates: patient, husband, daughter on 11/28 and 11/29  - Inter-disciplinary family meet or Palliative Care meeting due by 01/27/14   TODAY'S SUMMARY:  E coli bacteremia and septic shock, ARF, acute resp failure all improving  Transfer to Floor bed on 11/29 and to [facility] as of 11/30.   Baltazar Apo, MD, PhD 01/27/2014, 10:04 AM [facility] [phone] or if no answer [phone]

## 2014-01-27 NOTE — Progress Notes (Signed)
eLink Physician-Brief Progress Note Patient Name: Patty [name] DOB: 08-20-1940 MRN: [id number]   Date of Service  01/27/2014  HPI/Events of Note  Low k   eICU Interventions  supp k      Intervention Category Intermediate Interventions: Electrolyte abnormality - evaluation and management  Raylene Miyamoto. 01/27/2014, 5:40 AMlow

## 2014-01-27 NOTE — Progress Notes (Signed)
ANTICOAGULATION CONSULT NOTE - Follow Up  Pharmacy Consult for Warfarin Indication: atrial fibrillation  Allergies  Allergen Reactions  . Morphine And Related Anaphylaxis and Other (See Comments)    Pt states she stopped breathing post op- "went into resp. arrest"  . Demerol Nausea And Vomiting  . Meperidine Hcl Nausea And Vomiting  . Multaq [Dronedarone] Other (See Comments)    Reaction:  Blood in urine and elevated liver enzymes.   Jeanie Cooks Allergy] Rash  . Penicillins Itching and Rash  . Sulfa Drugs Cross Reactors Rash   Patient Measurements: Height: 5\' 6"  (167.6 cm) Weight: 150 lb 5.7 oz (68.2 kg) IBW/kg (Calculated) : 59.3  Vital Signs: Temp: 98.1 F (36.7 C) (11/29 0400) Temp Source: Oral (11/29 0400) BP: 163/63 mmHg (11/29 0600) Pulse Rate: 85 (11/29 0600)  Labs:  Recent Labs  01/25/14 0430 01/26/14 0445 01/26/14 1650 01/26/14 2300 01/27/14 0400  HGB 8.5* 8.7*  --   --  8.4*  HCT 27.1* 27.8*  --   --  27.6*  PLT 116* 192  --   --  197  LABPROT 29.9* 29.5*  --   --  37.9*  INR 2.82* 2.78*  --   --  3.82*  CREATININE 1.45* 1.24*  --   --  0.97  TROPONINI  --   --  <0.30 <0.30  --    Estimated Creatinine Clearance: 46.9 mL/min (by C-G formula based on Cr of 0.97).  Medical History: Past Medical History  Diagnosis Date  . Asthma 06/15/2010  . Hypothyroidism 06/15/2010  . Hyperlipemia 06/15/2010  . Atrial fibrillation 05/28/2010    Managed with rate control and coumadin  . Long-term (current) use of anticoagulants   . ACE-inhibitor cough   . DI (detrusor instability)   . Fibroid   . Rectocele   . Atrophic vaginitis   . 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 Diabetes mellitus     Type 2  . Peripheral neuropathy   . Hypertension   . Dysrhythmia     cardioversion - 2012  . Complication of anesthesia 1980's    post anesth.- states she "went into resp. arrest" , but then remarked that she thought maybe they gave her too much medicine  (morphine)   . Shortness of breath     sometimes   . 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 Anemia   . Malignant lymphoma, follicular 00/8/67  . CPAP (continuous positive airway pressure) dependence   . GERD (gastroesophageal reflux disease)   . Hearing loss   . Sleep apnea     uses CPAP everynight- last study in [place] 5 yrs. or more   . Severe sepsis with septic shock 01/21/2014  . E coli bacteremia 01/20/2014   Medications:  Scheduled:  . acyclovir  400 mg Oral Daily  . allopurinol  300 mg Oral Daily  . antiseptic oral rinse  7 mL Mouth Rinse QID  . arformoterol  15 mcg Nebulization BID  . budesonide (PULMICORT) nebulizer solution  0.25 mg Nebulization BID  . cefTRIAXone (ROCEPHIN)  IV  1 g Intravenous Q24H  . chlorhexidine  15 mL Mouth Rinse BID  . cholecalciferol  2,000 Units Oral Q lunch  . digoxin  125 mcg Oral Daily  . ezetimibe  10 mg Oral Daily  . furosemide  40 mg Intravenous Q12H  . insulin aspart  2-6 Units Subcutaneous 6 times per day  . insulin glargine  10 Units Subcutaneous Daily  .  levothyroxine  250 mcg Oral QAC breakfast  . magic mouthwash w/lidocaine  5 mL Oral QID  . magnesium oxide  400 mg Oral QHS  . montelukast  10 mg Oral Daily  . pantoprazole (PROTONIX) IV  40 mg Intravenous Q24H  . potassium chloride  10 mEq Intravenous Q1 Hr x 3  . potassium chloride  40 mEq Per Tube BID  . pravastatin  20 mg Oral Once per day on Mon Tue Wed Thu  . sodium chloride  3 mL Intravenous Q12H  . Warfarin - Pharmacist Dosing Inpatient   Does not apply q1800  Inpatient warfarin doses administered from 11/20: 5mg  at home prior to admission, 2.5mg  (11/21), 5mg  (11/22), 0mg  (11/23-11/26), 2.5mg  (11/27)  Assessment: 64 yoF admitted with sepsis secondary to HCAP following first course of chemotherapy for NHL. On chronic Warfarin for Afib: home dose 2.5mg  on Wed, Sat with 5mg  other days.  Admit INR 2.20, in therapeutic range.    Goal of Therapy:  INR 2-3  Today,  01/27/2014:  INR 3.82 (from 2.78 yesterday)   Pltc 197K post-chemotherapy, improving  Hgb low, stable post-chemotherapy.  No bleeding reported  On cardiac diet-poor po intake  Received Metronidazole x 3 doses 11/22-11/23  No current significant drug interactions with warfarin  Diet: NPO  Plan: 1. Hold warfarin due to SUPRAtherapeutic INR 2. Follow daily PT/INR 3. Follow CBC - following pltc post-chemotherapy 4. Follow clinical course and any reports of bleeding.   Kizzie Furnish, PharmD Pager: [phone] 01/27/2014 7:34 AM

## 2014-01-28 LAB — PROTIME-INR
INR: 3.87 — ABNORMAL HIGH (ref 0.00–1.49)
PROTHROMBIN TIME: 38.2 s — AB (ref 11.6–15.2)

## 2014-01-28 LAB — GLUCOSE, CAPILLARY
GLUCOSE-CAPILLARY: 117 mg/dL — AB (ref 70–99)
GLUCOSE-CAPILLARY: 96 mg/dL (ref 70–99)
Glucose-Capillary: 94 mg/dL (ref 70–99)
Glucose-Capillary: 109 mg/dL — ABNORMAL HIGH (ref 70–99)
Glucose-Capillary: 193 mg/dL — ABNORMAL HIGH (ref 70–99)
Glucose-Capillary: 115 mg/dL — ABNORMAL HIGH (ref 70–99)

## 2014-01-28 MED ORDER — INSULIN ASPART 100 UNIT/ML ~~LOC~~ SOLN: 0.0000 [IU] | Freq: Every day | SUBCUTANEOUS | Status: DC

## 2014-01-28 MED ORDER — PANTOPRAZOLE SODIUM 40 MG PO TBEC
40.0000 mg | DELAYED_RELEASE_TABLET | Freq: Every day | ORAL | Status: DC
  Administered 2014-01-29 – 2014-02-01 (×4): 40 mg via ORAL
  Filled 2014-01-28 (×4): qty 1

## 2014-01-28 MED ORDER — DICYCLOMINE HCL 10 MG PO CAPS
10.0000 mg | ORAL_CAPSULE | Freq: Every day | ORAL | Status: DC | PRN
  Filled 2014-01-28: qty 1

## 2014-01-28 NOTE — Progress Notes (Signed)

## 2014-01-28 NOTE — Progress Notes (Signed)
RT placed patient on CPAP. Setting is auto titrate (min 5 and max 15). Sterile water added to water chamber for humidification. Patient is tolerating well at this time. RT will continue to assess and monitor as needed.

## 2014-01-28 NOTE — Progress Notes (Signed)
Patty [name]   DOB:1940-09-29   PF#:[id number]   ZHG#:[id number]  Patient Care Team: Horatio Pel, MD as PCP - General (Internal Medicine) Laverda Page, MD as Consulting Physician (Cardiology)  I have seen the patient, examined her and edited the notes as follows  Subjective: Events since 11/27 noted. Per nursing report, she is "in and out" of consciousness, has significant episodes of nausea controlled with antiemetics. She is on CPAP. Endotracheal tube removed on 11/27.  Her bacteremia, and septic shock, and her acute renal and respiratory failure are improving.  No family is at bedside.   Scheduled Meds: . acyclovir  400 mg Oral Daily  . allopurinol  300 mg Oral Daily  . antiseptic oral rinse  7 mL Mouth Rinse QID  . arformoterol  15 mcg Nebulization BID  . budesonide (PULMICORT) nebulizer solution  0.25 mg Nebulization BID  . cefTRIAXone (ROCEPHIN)  IV  1 g Intravenous Q24H  . chlorhexidine  15 mL Mouth Rinse BID  . cholecalciferol  2,000 Units Oral Q lunch  . digoxin  125 mcg Oral Daily  . ezetimibe  10 mg Oral Daily  . insulin aspart  2-6 Units Subcutaneous 6 times per day  . insulin glargine  10 Units Subcutaneous Daily  . levothyroxine  250 mcg Oral QAC breakfast  . magic mouthwash w/lidocaine  5 mL Oral QID  . magnesium oxide  400 mg Oral QHS  . montelukast  10 mg Oral Daily  . pantoprazole (PROTONIX) IV  40 mg Intravenous Q24H  . potassium chloride  40 mEq Oral BID  . pravastatin  20 mg Oral Once per day on Mon Tue Wed Thu  . sodium chloride  10 mL Intravenous Q12H  . sodium chloride  3 mL Intravenous Q12H  . triamterene-hydrochlorothiazide  1 tablet Oral q morning - 10a  . Warfarin - Pharmacist Dosing Inpatient   Does not apply q1800   Continuous Infusions: . sodium chloride 1,000 mL (01/27/14 0758)   PRN Meds:albuterol, alum & mag hydroxide-simeth, fentaNYL, hydrALAZINE, LORazepam, ondansetron (ZOFRAN) IV, promethazine,  traMADol-acetaminophen   Objective:  Filed Vitals:   01/28/14 0600  BP: 152/53  Pulse: 86  Temp:   Resp: 22      Intake/Output Summary (Last 24 hours) at 01/28/14 0709 Last data filed at 01/28/14 0700  Gross per 24 hour  Intake   1510 ml  Output   3425 ml  Net  -1915 ml    ECOG PERFORMANCE STATUS: 3-4  GENERAL: Critically ill appearing, sedated. Responds to voice and to tactile stimuli, but cannot follow commands at this time SKIN: skin color, texture, turgor are pale, no rashes or significant lesions OROPHARYNX:cannot assess, patient sedated. NECK: supple, thyroid normal size, non-tender, without nodularity LYMPH:  no palpable lymphadenopathy in the cervical, axillary or inguinal LUNGS: essentially clear to auscultation bilaterally.  Right port appears nontender HEART:irregular rate & rhythm and no murmurs, 1+ bilateral lower extremity edema ABDOMEN: abdomen soft,non tender and normal bowel sounds Musculoskeletal:no cyanosis of digits and no clubbing  PSYCH: The patient is not alert at this time due to sedation NEURO: no apparent focal motor/sensory deficits, The patient is unable to interact due to sedation    CBG (last 3)   Recent Labs  01/27/14 1225 01/27/14 1643 01/28/14 0057  GLUCAP 137* 94 96     Labs:   Recent Labs Lab 01/23/14 0410 01/24/14 0430 01/25/14 0430 01/26/14 0445 01/27/14 0400  WBC 24.0* 14.2* 16.6* 17.3* 14.6*  HGB 8.9*  8.6* 8.5* 8.7* 8.4*  HCT 26.0* 26.0* 27.1* 27.8* 27.6*  PLT 100* 81* 116* 192 197  MCV 81.0 83.6 85.8 87.7 89.9  MCH 27.7 27.7 26.9 27.4 27.4  MCHC 34.2 33.1 31.4 31.3 30.4  RDW 15.5 15.4 15.2 15.4 15.3  LYMPHSABS  --  0.4* 0.5* 0.5*  --   MONOABS  --  0.4 0.7 1.3*  --   EOSABS  --  0.2 0.2 0.7  --   BASOSABS  --  0.0 0.0 0.0  --      Chemistries:    Recent Labs Lab 01/23/14 0410 01/24/14 0430 01/25/14 0430 01/26/14 0445 01/27/14 0400  NA 138 142 143 148* 150*  K 3.4* 3.3* 3.4* 2.8* 3.0*  CL 99 101  102 103 104  CO2 _0 40*  GLUCOSE 228* 133* 129* 117* 141*  BUN 62* 62* 61* 52* 33*  CREATININE 2.01* 1.58* 1.45* 1.24* 0.97  CALCIUM 8.0* 8.2* 8.5 8.6 8.7  MG 2.1 1.9 2.2 1.8 1.9  AST  --   --   --   --  15  ALT  --   --   --   --  15  ALKPHOS  --   --   --   --  67  BILITOT  --   --   --   --  0.4    GFR Estimated Creatinine Clearance: 46.9 mL/min (by C-G formula based on Cr of 0.97).  Liver Function Tests:  Recent Labs Lab 01/27/14 0400  AST 15  ALT 15  ALKPHOS 67  BILITOT 0.4  PROT 5.2*  ALBUMIN 2.5*    Recent Labs Lab 01/26/14 1650  LIPASE 8*   No results for input(s): AMMONIA in the last 168 hours.  Urine Studies     Component Value Date/Time   COLORURINE YELLOW 01/18/2014 1754   APPEARANCEUR CLOUDY* 01/18/2014 1754   LABSPEC 1.014 01/18/2014 1754   PHURINE 7.0 01/18/2014 1754   GLUCOSEU NEGATIVE 01/18/2014 1754   HGBUR MODERATE* 01/18/2014 1754   BILIRUBINUR NEGATIVE 01/18/2014 1754   KETONESUR 15* 01/18/2014 1754   PROTEINUR 100* 01/18/2014 1754   UROBILINOGEN 1.0 01/18/2014 1754   NITRITE NEGATIVE 01/18/2014 1754   LEUKOCYTESUR SMALL* 01/18/2014 1754    Coagulation profile  Recent Labs Lab 01/24/14 0430 01/25/14 0430 01/26/14 0445 01/27/14 0400 01/28/14 0613  INR 3.20* 2.82* 2.78* 3.82* 3.87*    Cardiac Enzymes:  Recent Labs Lab 01/23/14 0828 01/26/14 1650 01/26/14 2300 01/27/14 0801 01/27/14 1700  TROPONINI <0.30 <0.30 <0.30 <0.30 <0.30   BNP: Invalid input(s): POCBNP CBG:  Recent Labs Lab 01/27/14 0403 01/27/14 0844 01/27/14 1225 01/27/14 1643 01/28/14 0057  GLUCAP 132* 119* 137* 94 96     Imaging Studies:  No results found.   Assessment/Plan: 73 y.o.  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 status post cycle 1 of chemotherapy with bendamustine and rituximab on 01/09/2014-bendamustine on day 1 and 2 with rituximab on day 1 and q 28 days, for  planned 6 cycles for curative intent Neulasta support was given on 11/13. Continue aggressive supportive care.  Anemia Thrombocytopenia, resolved This is likely anemia of chronic disease and recent chemotherapy No bleeding issues reported overnight such as epistaxis, hematuria or hematochezia.  Thrombocytopenia resolved on 11/28 She does not require transfusion now unless hemoglobin less than 8 g, and platelets less than 10,000 or  20,000 if acutely bleeding  Leukocytosis In the setting of  Lymphoma, recent Neulasta on 11/13, steroids, Inflammation, fever, and dyspnea This is trending down to normal  At high risk of tumor lysis syndrome, renal failure from recent sepsis On fluid hydration and allopurinol. Renal functions improved. Will consider discontinuing allopurinol. Check uric acid today.  Abdominal distention  Diarrhea May be due to recent chemotherapy C. difficile was negative diarrhea resolved   Escherichia coli in urine and in blood with renal failure Klebsiella in Urine On Ceftriaxone IV, day 10 of 14  Acute respiratory failure initially due to inability to compensate for met acidosis,pulmonary edema, septic shock, bradycardia, hypernatremia and acute oliguric renal failure Resolved. Appreciate Critical Care involvement  Atrial fibrillation Her most recent echocardiogram show preserved ejection fraction. On Cardizem, Digoxin And Coumadin Recommend close follow-up and INR monitoring as INR supratherapeutic today. Pharmacy is following  DVT prophylaxis On Coumadin and mechanical devices Appreciate Pharmacy involvement  Full Code  Disposition: Likely to floor bed today if clinically stable. Will continue to follow  Other medical issues as per admitting team   **Disclaimer: This note was dictated with voice recognition software. Similar sounding words can inadvertently be transcribed and this note may contain transcription errors which may not have been  corrected upon publication of note.** WERTMAN,SARA E, PA-C 01/28/2014  7:09 AM , , MD 01/28/2014  

## 2014-01-28 NOTE — Evaluation (Signed)
Physical Therapy Evaluation Patient Details Name: Patty [name] MRN: [id number] DOB: 1940-06-20 Today's Date: 01/28/2014   History of Present Illness  Patty [name] is a 73 y.o. female with a recent diagnosis of High Grade Follicular Lymphoma ( Dx: 11/2013) on Chemo Rx with first Chemotherapy Rx on 11/11/ 2015 who presented to the ED 01/18/14 with complaints of Fevers and Chills and Confusion PMHx: HTN, DM  Clinical Impression  Pt will benefit from PT to address deficits below; may need STSNF unless progresses rapidly,pt reports being very I prior to this adm, no family present at time of eval;    Follow Up Recommendations SNF    Equipment Recommendations  Rolling walker with 5" wheels    Recommendations for Other Services       Precautions / Restrictions Precautions Precautions: Fall      Mobility  Bed Mobility               General bed mobility comments: pt in chair with nursing  Transfers Overall transfer level: Needs assistance   Transfers: Sit to/from Stand Sit to Stand: +2 physical assistance;Mod assist         General transfer comment: pt with difficulty coming to full stand, knees and trunk flexed, posterior lean  Ambulation/Gait                Stairs            Wheelchair Mobility    Modified Rankin (Stroke Patients Only)       Balance Overall balance assessment: Needs assistance           Standing balance-Leahy Scale: Zero Standing balance comment: posterior bias in standing,pt unable to correct with cues                              Pertinent Vitals/Pain Pain Assessment: No/denies pain     Home Living Family/patient expects to be discharged to:: Unsure Living Arrangements: Spouse/significant other                    Prior Function Level of Independence: Independent         Comments: per pt     Hand Dominance        Extremity/Trunk Assessment   Upper Extremity Assessment: Defer  to OT evaluation           Lower Extremity Assessment: Generalized weakness         Communication   Communication: No difficulties  Cognition Arousal/Alertness: Awake/alert Behavior During Therapy: WFL for tasks assessed/performed Overall Cognitive Status: Within Functional Limits for tasks assessed                      General Comments General comments (skin integrity, edema, etc.): pt incontinent of stool in chair; required 3rd person to complete hygiene in standing    Exercises        Assessment/Plan    PT Assessment Patient needs continued PT services  PT Diagnosis Difficulty walking;Generalized weakness   PT Problem List Decreased strength;Decreased activity tolerance;Decreased balance;Decreased mobility;Decreased knowledge of use of DME  PT Treatment Interventions DME instruction;Gait training;Functional mobility training;Therapeutic activities;Therapeutic exercise;Patient/family education;Balance training   PT Goals (Current goals can be found in the Care Plan section) Acute Rehab PT Goals Patient Stated Goal: to get stronger PT Goal Formulation: With patient Time For Goal Achievement: 02/04/14 Potential to Achieve Goals: Good    Frequency Min  3X/week   Barriers to discharge        Co-evaluation               End of Session Equipment Utilized During Treatment: Gait belt Activity Tolerance: Patient limited by fatigue Patient left: in chair Nurse Communication: Mobility status         Time: 1031-2811 PT Time Calculation (min) (ACUTE ONLY): 19 min   Charges:   PT Evaluation $Initial PT Evaluation Tier I: 1 Procedure PT Treatments $Therapeutic Activity: 8-22 mins   PT G Codes:          Ayman Brull 02/02/14, 2:57 PM

## 2014-01-28 NOTE — Plan of Care (Signed)
Problem: Phase I Progression Outcomes Goal: OOB as tolerated unless otherwise ordered Outcome: Completed/Met Date Met:  01/28/14     

## 2014-01-28 NOTE — Progress Notes (Signed)
Report given to [facility]. Patient is stable at transfer.

## 2014-01-28 NOTE — Progress Notes (Signed)
ANTICOAGULATION CONSULT NOTE - Follow Up  Pharmacy Consult for Warfarin Indication: atrial fibrillation  Allergies  Allergen Reactions  . Morphine And Related Anaphylaxis and Other (See Comments)    Pt states she stopped breathing post op- "went into resp. arrest"  . Demerol Nausea And Vomiting  . Meperidine Hcl Nausea And Vomiting  . Multaq [Dronedarone] Other (See Comments)    Reaction:  Blood in urine and elevated liver enzymes.   Jeanie Cooks Allergy] Rash  . Penicillins Itching and Rash  . Sulfa Drugs Cross Reactors Rash   Patient Measurements: Height: 5\' 6"  (167.6 cm) Weight: 145 lb 8.1 oz (66 kg) IBW/kg (Calculated) : 59.3  Vital Signs: Temp: 99 F (37.2 C) (11/30 0400) Temp Source: Axillary (11/30 0400) BP: 152/53 mmHg (11/30 0600) Pulse Rate: 86 (11/30 0600)  Labs:  Recent Labs  01/26/14 0445  01/26/14 2300 01/27/14 0400 01/27/14 0801 01/27/14 1700 01/28/14 0613  HGB 8.7*  --   --  8.4*  --   --   --   HCT 27.8*  --   --  27.6*  --   --   --   PLT 192  --   --  197  --   --   --   LABPROT 29.5*  --   --  37.9*  --   --  38.2*  INR 2.78*  --   --  3.82*  --   --  3.87*  CREATININE 1.24*  --   --  0.97  --   --   --   TROPONINI  --   < > <0.30  --  <0.30 <0.30  --   < > = values in this interval not displayed. Estimated Creatinine Clearance: 46.9 mL/min (by C-G formula based on Cr of 0.97).  Medical History: Past Medical History  Diagnosis Date  . Asthma 06/15/2010  . Hypothyroidism 06/15/2010  . Hyperlipemia 06/15/2010  . Atrial fibrillation 05/28/2010    Managed with rate control and coumadin  . Long-term (current) use of anticoagulants   . ACE-inhibitor cough   . DI (detrusor instability)   . Fibroid   . Rectocele   . Atrophic vaginitis   . 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 Diabetes mellitus     Type 2  . Peripheral neuropathy   . Hypertension   . Dysrhythmia     cardioversion - 2012  . Complication of anesthesia 1980's    post anesth.- states she "went into resp. arrest" , but then remarked that she thought maybe they gave her too much medicine (morphine)   . Shortness of breath     sometimes   . 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 Anemia   . Malignant lymphoma, follicular 42/3/53  . CPAP (continuous positive airway pressure) dependence   . GERD (gastroesophageal reflux disease)   . Hearing loss   . Sleep apnea     uses CPAP everynight- last study in [place] 5 yrs. or more   . Severe sepsis with septic shock 01/21/2014  . E coli bacteremia 01/20/2014    Inpatient warfarin doses administered from 11/20: 5mg  at home prior to admission, 2.5mg  (11/21), 5mg  (11/22), 0mg  (11/23-11/26), 2.5mg  (11/27-28), held 11/29  Assessment: 50 yoF admitted with sepsis secondary to HCAP following first course of chemotherapy for NHL. On chronic Warfarin for Afib: home dose 2.5mg  on Wed, Sat with 5mg  other days.  Admit INR 2.20, in therapeutic range.    Goal  of Therapy:  INR 2-3  Today, 01/28/2014:  INR 3.87, increased significantly yesterday and remains elevated today.  Patient's INR has been much more sensitive to warfarin this admission and is elevated despite low doses.  Contributing factors likely low oral intake, diarrhea, and s/p septic shock.  Pltc 197K post-chemotherapy, improving  Hgb low, stable post-chemotherapy.  No bleeding reported  No current significant drug interactions with warfarin  Diet: cardiac diet started 11/28, poor intake, previously NPO  Plan: 1. Hold warfarin due to SUPRAtherapeutic INR 2. Follow daily PT/INR 3. Follow CBC - following pltc post-chemotherapy 4. Follow clinical course and any reports of bleeding.   Hershal Coria, PharmD, BCPS Pager: [phone] 01/28/2014 8:39 AM

## 2014-01-28 NOTE — Care Management Note (Signed)
    Page 1 of 1   01/28/2014     1:07:45 PM CARE MANAGEMENT NOTE 01/28/2014  Patient:  Patty [name], Patty [name]   Account Number:  [id number]  Date Initiated:  01/28/2014  Documentation initiated by:  Will Schier  Subjective/Objective Assessment:   73 y.o. 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   (If response is "NO", the following Medicare IM given date fields will be blank) Date Medicare IM given:   Medicare IM given by:   Date Additional Medicare IM given:   Additional Medicare IM given by:    Discharge Disposition:    Per UR Regulation:    If discussed at Long Length of Stay Meetings, dates discussed:    Comments:  [id number]/[place] Catena Rosana Hoes, RN, BSN, CCM Chart reviewed. Discharge needs and patient's stay to be reviewed and followed by case manager. Chart note for progression of stay: Events since 11/27 noted. Per nursing report, she is "in and out" of consciousness, has significant episodes of nausea controlled with antiemetics. She is on CPAP. Endotracheal tube removed on 11/27.  Her bacteremia, and septic shock, and her acute renal and respiratory failure are improving.  No family is at bedside.   11/20 Admit 11/22 Transfer to SDU with hypotension 11/23 Started on BiPAP for increased WOB in setting progressive metabolic acidosis.   11/24 worsening resp distress. 11/24 Intubated; ST depressions noted ECG, troponin negative 01/23/14 :  UOP has improved last 24h 11/26 tolerated PSV 8/5 for several hours

## 2014-01-28 NOTE — Progress Notes (Addendum)
Patient ID: Patty [name], female   DOB: 1940-11-16, 73 y.o.   MRN: [id number] [facility] PROGRESS NOTE  Patty [name] YKZ:[id number] DOB: 04/29/1940 DOA: 01/18/2014 PCP: Horatio Pel, MD  Brief narrative:    73 y.o. 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 Patient was started on empiric antibiotics including vancomycin and cefepime on admission for treatment of severe sepsis thought to be secondary to healthcare associated pneumonia as well as bacteremia. Her hospital course was complicated with worsening respiratory failure and requirement for endotracheal intubation on 01/22/2014. As noted, patient was found to have bacteremia secondary to Escherichia coli. Additionally, urine culture is growing Escherichia coli and Klebsiella. Patient was subsequently extubated 01/25/2014. [facility] assumed primary care starting 01/28/2014.  Assessment/Plan:    Principal Problem: Acute respiratory failure with hypoxia / healthcare associated pneumonia / obstructive sleep apnea - Patient required intubation on 01/22/2014. Respiratory failure thought to be secondary to healthcare associated pneumonia as well as inability to compensate for metabolic acidosis, pulmonary edema. - Patient successfully extubated 01/25/2014. - Continue Brovana nebulizer treatment twice daily, Pulmicort nebulizer treatment twice daily. Continue albuterol nebulizer every 2 hours as needed for shortness of breath or wheezing.  - Patient was initially on empiric antibiotics with vancomycin and cefepime. She is currently on Rocephin IV every 24 hours.  - Continue oxygen support via nasal  cannula to keep oxygen saturation above 90%. - Patient was transferred to medical floor 01/28/2014.   Active Problems: Severe sepsis secondary to healthcare associated pneumonia and Escherichia coli bacteremia and Escherichia coli and Klebsiella UTI / leukocytosis - Patient was initially on empiric vancomycin and cefepime for severe sepsis. - Blood cultures on the admission grew Escherichia coli. Urine culture on admission grew Escherichia coli and Klebsiella. Repeat blood cultures show no growth to date. - Patient is currently on Rocephin IV daily. - Influenza panel negative.  Diarrhea - Patient had diarrhea on admission which was thought to be secondary to chemotherapy given elevated white blood cell count on the admission.  - C. difficile negative   Atrial fibrillation / long-term (current) use of anticoagulants - Heart rate controlled with digoxin  - Continue Coumadin dosing per pharmacy   Hyperlipemia - Continues zetia and Pravachol.  Hypothyroidism - Continue Synthroid.  Diabetes mellitus type 2, controlled, without complications - Continue Lantus 10 units daily in addition to sliding scale insulin.  - CBG's in past 24 hours: 115, 118, 136   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, face, and neck - Status post recent chemotherapy. - Continue allopurinol for prevention of tumor lysis/elevated uric acid. - Continue acyclovir prophylaxis.  Anemia in neoplastic disease / thrombocytopenia - Likely secondary to recent chemotherapy. No current indication for transfusion. - Hemoglobin is 8.4 and platelet count is within normal limits  Hypokalemia  - Secondary to GI losses, nausea; please note that patietn has received lasix in ICU for fluid overload which could contribute to hypokalemia as well - Potassium being repleted. Magnesium checked and is 1.5, WNL.  Acute renal failure  - Likely secondary to sepsis - Creatinine normalized with IV fluids.   Hypertension -  Continue Maxzide daily   DVT Prophylaxis  - On anticoagulation with  Coumadin  Code Status: Full.  Family Communication:  plan of care discussed with the patient Disposition Plan: needs PT eval for safe D/C plan   IV access:   PAC  Procedures and diagnostic studies:    Dg Chest Port 1 View February 13, 2014  Stable bilateral lower lobe opacities     Dg Chest Port 1 View 01/25/2014  Bilateral pleural effusions left greater than right.  Tubes and lines as described.     Medical Consultants:   None   Other Consultants:   Physical therapy    Anti-Infectives:   Vanco 11/21 >> 11/23 Cefepime 11/21 >> 11/23 Ceftriaxone 11/23 >> plan 14 days abx total (day 11 on 12/1)   Leisa Lenz, MD  [facility] Pager [phone]  If 7PM-7AM, please contact night-coverage [email or web address] Password [email or web address] 01/28/2014, 11:24 AM   LOS: 10 days    HPI/Subjective: No acute overnight events.  Objective: Filed Vitals:   01/28/14 0200 01/28/14 0400 01/28/14 0600 01/28/14 0800  BP: 162/64 151/49 152/53   Pulse: 74 70 86   Temp:  99 F (37.2 C)  97.7 F (36.5 C)  TempSrc:  Axillary  Oral  Resp: 20 25 22    Height:      Weight:  66 kg (145 lb 8.1 oz)    SpO2: 96% 90% 94%     Intake/Output Summary (Last 24 hours) at 01/28/14 1124 Last data filed at 01/28/14 1053  Gross per 24 hour  Intake   1200 ml  Output   1575 ml  Net   -375 ml    Exam:   General:  Pt is alert, follows commands appropriately, not in acute distress  Cardiovascular: Regular rate and rhythm, S1/S2 appreciated   Respiratory: no wheezing, no crackles, no rhonchi  Abdomen: non tender, non distended, bowel sounds present  Extremities: No edema, pulses DP and PT palpable bilaterally  Neuro: Grossly nonfocal  Data Reviewed: Basic Metabolic Panel:  Recent Labs Lab 01/22/14 0332 01/23/14 0410 01/24/14 0430 01/25/14 0430 02-13-2014 0445 01/27/14 0400  NA 133* 138 142 143 148* 150*  K 4.0 3.4* 3.3* 3.4*  2.8* 3.0*  CL 100 99 101 102 103 104  CO2 18* 25 28 29 31  40*  GLUCOSE 225* 228* 133* 129* 117* 141*  BUN 59* 62* 62* 61* 52* 33*  CREATININE 2.30* 2.01* 1.58* 1.45* 1.24* 0.97  CALCIUM 8.2* 8.0* 8.2* 8.5 8.6 8.7  MG 2.2 2.1 1.9 2.2 1.8 1.9  PHOS 4.9*  --  3.4 3.5 3.7 2.4   Liver Function Tests:  Recent Labs Lab 01/27/14 0400  AST 15  ALT 15  ALKPHOS 67  BILITOT 0.4  PROT 5.2*  ALBUMIN 2.5*    Recent Labs Lab February 13, 2014 1650  LIPASE 8*   No results for input(s): AMMONIA in the last 168 hours. CBC:  Recent Labs Lab 01/23/14 0410 01/24/14 0430 01/25/14 0430 02/13/14 0445 01/27/14 0400  WBC 24.0* 14.2* 16.6* 17.3* 14.6*  NEUTROABS  --  13.3* 15.3* 14.9*  --   HGB 8.9* 8.6* 8.5* 8.7* 8.4*  HCT 26.0* 26.0* 27.1* 27.8* 27.6*  MCV 81.0 83.6 85.8 87.7 89.9  PLT 100* 81* 116* 192 197   Cardiac Enzymes:  Recent Labs Lab 01/23/14 0828 Feb 13, 2014 1650 February 13, 2014 2300 01/27/14 0801 01/27/14 1700  TROPONINI <0.30 <0.30 <0.30 <0.30 <0.30   BNP: Invalid input(s): POCBNP CBG:  Recent Labs Lab 01/27/14 0403 01/27/14 0844 01/27/14 1225 01/27/14 1643 01/28/14 0057  GLUCAP 132* 119* 137* 94 96    Blood  Culture (routine x 2)     Status: None   Collection Time: 01/18/14  5:51 PM  Result Value Ref Range Status   Specimen Description BLOOD RIGHT ARM  5 ML IN [facility] BOTTLE  Final   Special Requests NONE  Final   Culture  Setup Time   Final   Culture   Final    ESCHERICHIA COLI    Report Status 01/21/2014 FINAL  Final   Organism ID, Bacteria ESCHERICHIA COLI  Final      Susceptibility   Escherichia coli - MIC*    AMPICILLIN <=2 SENSITIVE Sensitive     AMPICILLIN/SULBACTAM <=2 SENSITIVE Sensitive     CEFAZOLIN <=4 SENSITIVE Sensitive     CEFEPIME <=1 SENSITIVE Sensitive     CEFTAZIDIME <=1 SENSITIVE Sensitive     CEFTRIAXONE <=1 SENSITIVE Sensitive     CIPROFLOXACIN <=0.25 SENSITIVE Sensitive     GENTAMICIN >=16 RESISTANT Resistant     IMIPENEM <=0.25 SENSITIVE  Sensitive     PIP/TAZO <=4 SENSITIVE Sensitive     TOBRAMYCIN 8 INTERMEDIATE Intermediate     TRIMETH/SULFA <=20 SENSITIVE Sensitive     * ESCHERICHIA COLI  Urine culture     Status: None   Collection Time: 01/18/14  5:55 PM  Result Value Ref Range Status   Specimen Description URINE, CATHETERIZED  Final   Special Requests NONE  Final   Culture  Setup Time   Final   Colony Count   Final   Culture   Final    ESCHERICHIA COLI KLEBSIELLA PNEUMONIAE       Susceptibility   Escherichia coli - MIC*    AMPICILLIN <=2 SENSITIVE Sensitive     CEFAZOLIN <=4 SENSITIVE Sensitive     CEFTRIAXONE <=1 SENSITIVE Sensitive     CIPROFLOXACIN <=0.25 SENSITIVE Sensitive     GENTAMICIN >=16 RESISTANT Resistant     LEVOFLOXACIN 1 SENSITIVE Sensitive     NITROFURANTOIN <=16 SENSITIVE Sensitive     TOBRAMYCIN 8 INTERMEDIATE Intermediate     TRIMETH/SULFA <=20 SENSITIVE Sensitive     PIP/TAZO* <=4 SENSITIVE Sensitive      * SET UP TIME:  [id number]    * ESCHERICHIA COLI   Klebsiella pneumoniae - MIC*    AMPICILLIN RESISTANT      CEFAZOLIN <=4 SENSITIVE Sensitive     CEFTRIAXONE <=1 SENSITIVE Sensitive     CIPROFLOXACIN <=0.25 SENSITIVE Sensitive     GENTAMICIN <=1 SENSITIVE Sensitive     LEVOFLOXACIN <=0.12 SENSITIVE Sensitive     NITROFURANTOIN 64 INTERMEDIATE Intermediate     TOBRAMYCIN <=1 SENSITIVE Sensitive     TRIMETH/SULFA <=20 SENSITIVE Sensitive     PIP/TAZO <=4 SENSITIVE Sensitive     * KLEBSIELLA PNEUMONIAE  Blood Culture (routine x 2)     Status: None   Collection Time: 01/18/14  6:04 PM  Result Value Ref Range Status   Specimen Description BLOOD  Final   Special Requests BOTTLES DRAWN AEROBIC ONLY 3 CC  Final   Culture  Setup Time   Final   Culture   Final    ESCHERICHIA COLI    Report Status 01/21/2014 FINAL  Final  Clostridium Difficile by PCR     Status: None   Collection Time: 01/20/14  6:17 AM  Result Value Ref Range Status   C difficile by pcr NEGATIVE NEGATIVE Final     Comment: Performed at [facility]  Culture, blood (routine x 2)     Status: None   Collection Time:  01/20/14 10:35 AM  Result Value Ref Range Status   Specimen Description BLOOD RIGHT ARM  Final   Special Requests   Final    BOTTLES DRAWN AEROBIC AND ANAEROBIC 10 CC BOTH BOTTLES   Culture  Setup Time   Final   Culture   Final    NO GROWTH 5 DAYS   Report Status 01/26/2014 FINAL  Final  Culture, blood (routine x 2)     Status: None   Collection Time: 01/20/14 11:00 AM  Result Value Ref Range Status   Specimen Description BLOOD PAC  10 ML IN [facility] BOTTLE  Final   Special Requests Immunocompromise  Final   Culture  Setup Time   Final   Culture   Final    NO GROWTH 5 DAYS   Report Status 01/26/2014 FINAL  Final  MRSA PCR Screening     Status: None   Collection Time: 01/21/14  2:40 PM  Result Value Ref Range Status   MRSA by PCR NEGATIVE NEGATIVE Final    Comment:           Scheduled Meds: . acyclovir  400 mg Oral Daily  . allopurinol  300 mg Oral Daily  . arformoterol  15 mcg Nebulization BID  . budesonide   0.25 mg Nebulization BID  . cefTRIAXone  1 g Intravenous Q24H  . cholecalciferol  2,000 Units Oral Q lunch  . digoxin  125 mcg Oral Daily  . ezetimibe  10 mg Oral Daily  . insulin aspart  2-6 Units Subcutaneous 6 times per day  . insulin glargine  10 Units Subcutaneous Daily  . levothyroxine  250 mcg Oral QAC breakfast  . magic mouthwash w/lid  5 mL Oral QID  . magnesium oxide  400 mg Oral QHS  . montelukast  10 mg Oral Daily  .  pantoprazole  40 mg Oral Daily  . potassium chloride  40 mEq Oral BID  . pravastatin  20 mg Oral Mon Tue Wed Thu  . triamterene-hydrochlorot  1 tablet Oral q morning - 10a  . Warfarin - Pharmacist   Does not apply [phone]

## 2014-01-29 ENCOUNTER — Encounter (HOSPITAL_COMMUNITY): Payer: Self-pay

## 2014-01-29 DIAGNOSIS — I482 Chronic atrial fibrillation: Secondary | ICD-10-CM

## 2014-01-29 LAB — GLUCOSE, CAPILLARY
GLUCOSE-CAPILLARY: 96 mg/dL (ref 70–99)
GLUCOSE-CAPILLARY: 108 mg/dL — AB (ref 70–99)
GLUCOSE-CAPILLARY: 118 mg/dL — AB (ref 70–99)
GLUCOSE-CAPILLARY: 127 mg/dL — AB (ref 70–99)
Glucose-Capillary: 136 mg/dL — ABNORMAL HIGH (ref 70–99)
Glucose-Capillary: 139 mg/dL — ABNORMAL HIGH (ref 70–99)

## 2014-01-29 LAB — BASIC METABOLIC PANEL
Anion gap: 7 (ref 5–15)
BUN: 19 mg/dL (ref 6–23)
CO2: 37 mEq/L — ABNORMAL HIGH (ref 19–32)
Calcium: 8.5 mg/dL (ref 8.4–10.5)
Chloride: 103 mEq/L (ref 96–112)
Creatinine, Ser: 0.72 mg/dL (ref 0.50–1.10)
GFR calc non Af Amer: 82 mL/min — ABNORMAL LOW (ref >90)
Glucose, Bld: 147 mg/dL — ABNORMAL HIGH (ref 70–99)
Potassium: 3.1 mEq/L — ABNORMAL LOW (ref 3.7–5.3)
Sodium: 147 mEq/L (ref 137–147)

## 2014-01-29 LAB — MAGNESIUM: Magnesium: 1.5 mg/dL (ref 1.5–2.5)

## 2014-01-29 LAB — PROTIME-INR
INR: 3.13 — ABNORMAL HIGH (ref 0.00–1.49)
Prothrombin Time: 32.4 seconds — ABNORMAL HIGH (ref 11.6–15.2)

## 2014-01-29 MED ORDER — POTASSIUM CHLORIDE IN NACL 40-0.9 MEQ/L-% IV SOLN
INTRAVENOUS | Status: DC
  Administered 2014-01-29: 10 mL/h via INTRAVENOUS
  Administered 2014-01-30 – 2014-01-31 (×2): 100 mL/h via INTRAVENOUS
  Filled 2014-01-29 (×11): qty 1000

## 2014-01-29 NOTE — Progress Notes (Signed)
NUTRITION FOLLOW UP  Intervention:   -Provide Magic cup TID with meals, each supplement provides 290 kcal and 9 grams of protein -Encourage PO intake -RD to continue to monitor  Nutrition Dx:   Inadequate oral intake, ongoing  Monitor:   PO and supplemental intake, weight, labs, I/O's  Assessment:   Patient admitted with AMS, nausea, fever, possible LLL infiltrate on CXR.  Dx with E. Coli bacteremia.  He has a h/o follicular lymphoma, A fib, HTN, HLD, hypothyroidism, and DM.  At recent nutrition assessment, patient was would to have stable intake at weight. Pt intubated 11/24. Pt extubated 11/27.  Pt with 15 lb weight loss since 11/25.  Poor PO intake: 10%. Pt has drank some V8 juice and ate a container of ice cream. Pt just ordered lemonade to sip on prior to visit. Pt reports she could eat a whole tub of ice cream. Pt is willing to try magic cup supplements on meal trays.  Pt reports still feeling nauseous. Encouraged pt to try and eat as much as tolerated.  Labs reviewed: Low Na & K Elevated BUN Glucose 141 Mg/Phos WNL  Height: Ht Readings from Last 1 Encounters:  01/20/14 5\' 6"  (1.676 m)    Weight Status:   Wt Readings from Last 1 Encounters:  01/29/14 147 lb 4.3 oz (66.8 kg)    Re-estimated needs:  Kcal: 1600-1700 Protein: 80-95g Fluid: >1.8 L/day  Skin: intact  Diet Order: Diet regular   Intake/Output Summary (Last 24 hours) at 01/29/14 1544 Last data filed at 01/29/14 1352  Gross per 24 hour  Intake    675 ml  Output   2525 ml  Net  -1850 ml    Last BM: 11/30   Labs:   Recent Labs Lab 01/25/14 0430 01/26/14 0445 01/27/14 0400 01/29/14 1145  NA 143 148* 150* 147  K 3.4* 2.8* 3.0* 3.1*  CL 102 103 104 103  CO2 29 31 40* 37*  BUN 61* 52* 33* 19  CREATININE 1.45* 1.24* 0.97 0.72  CALCIUM 8.5 8.6 8.7 8.5  MG 2.2 1.8 1.9 1.5  PHOS 3.5 3.7 2.4  --   GLUCOSE 129* 117* 141* 147*    CBG (last 3)   Recent Labs  01/28/14 2033  01/29/14 0741 01/29/14 1145  GLUCAP 115* 118* 136*    Scheduled Meds: . arformoterol  15 mcg Nebulization BID  . budesonide (PULMICORT) nebulizer solution  0.25 mg Nebulization BID  . cefTRIAXone (ROCEPHIN)  IV  1 g Intravenous Q24H  . cholecalciferol  2,000 Units Oral Q lunch  . digoxin  125 mcg Oral Daily  . ezetimibe  10 mg Oral Daily  . insulin aspart  0-5 Units Subcutaneous QHS  . insulin glargine  10 Units Subcutaneous Daily  . levothyroxine  250 mcg Oral QAC breakfast  . magic mouthwash w/lidocaine  5 mL Oral QID  . magnesium oxide  400 mg Oral QHS  . montelukast  10 mg Oral Daily  . pantoprazole  40 mg Oral Daily  . pravastatin  20 mg Oral Once per day on Mon Tue Wed Thu  . sodium chloride  10 mL Intravenous Q12H  . sodium chloride  3 mL Intravenous Q12H  . Warfarin - Pharmacist Dosing Inpatient   Does not apply q1800    Continuous Infusions: . 0.9 % NaCl with KCl 40 mEq / L 10 mL/hr (01/29/14 1513)    Clayton Bibles, MS, RD, LDN Pager: [phone] After Hours Pager: [phone]

## 2014-01-29 NOTE — Plan of Care (Signed)
Problem: Phase I Progression Outcomes Goal: Dyspnea controlled at rest Outcome: Progressing Goal: Pain controlled with appropriate interventions Outcome: Progressing Goal: First antibiotic given within 6hrs of admit Outcome: Completed/Met Date Met:  01/29/14 Goal: Confirm chest x-ray completed Outcome: Completed/Met Date Met:  01/29/14 Goal: Consider Infectious Disease Consult Outcome: Not Applicable Date Met:  07/62/26 Goal: Consider pulmonary consult Outcome: Completed/Met Date Met:  01/29/14 Goal: Code status addressed with pt/family Outcome: Completed/Met Date Met:  01/29/14 Goal: Initial discharge plan identified Outcome: Progressing Goal: Voiding-avoid urinary catheter unless indicated Outcome: Not Met (add Reason) Foley for diuresis

## 2014-01-29 NOTE — Progress Notes (Signed)
Dr. Charlies Silvers notified on the result of patient's potassium 3.1,no new order. Patient on NS with 58meq at 10cc/hr on her IVF.Sandie Ano RN

## 2014-01-29 NOTE — Progress Notes (Signed)
ANTICOAGULATION CONSULT NOTE - Follow Up  Pharmacy Consult for Warfarin Indication: atrial fibrillation  Allergies  Allergen Reactions  . Morphine And Related Anaphylaxis and Other (See Comments)    Pt states she stopped breathing post op- "went into resp. arrest"  . Demerol Nausea And Vomiting  . Meperidine Hcl Nausea And Vomiting  . Multaq [Dronedarone] Other (See Comments)    Reaction:  Blood in urine and elevated liver enzymes.   Jeanie Cooks Allergy] Rash  . Penicillins Itching and Rash  . Sulfa Drugs Cross Reactors Rash   Patient Measurements: Height: 5\' 6"  (167.6 cm) Weight: 147 lb 4.3 oz (66.8 kg) IBW/kg (Calculated) : 59.3  Vital Signs: Temp: 97.5 F (36.4 C) (12/01 0513) Temp Source: Oral (12/01 0513) BP: 162/76 mmHg (12/01 0513) Pulse Rate: 79 (12/01 0513)  Labs:  Recent Labs  01/26/14 2300 01/27/14 0400 01/27/14 0801 01/27/14 1700 01/28/14 0613 01/29/14 0501  HGB  --  8.4*  --   --   --   --   HCT  --  27.6*  --   --   --   --   PLT  --  197  --   --   --   --   LABPROT  --  37.9*  --   --  38.2* 32.4*  INR  --  3.82*  --   --  3.87* 3.13*  CREATININE  --  0.97  --   --   --   --   TROPONINI <0.30  --  <0.30 <0.30  --   --    Estimated Creatinine Clearance: 46.9 mL/min (by C-G formula based on Cr of 0.97).  Medical History: Past Medical History  Diagnosis Date  . Asthma 06/15/2010  . Hypothyroidism 06/15/2010  . Hyperlipemia 06/15/2010  . Atrial fibrillation 05/28/2010    Managed with rate control and coumadin  . Long-term (current) use of anticoagulants   . ACE-inhibitor cough   . DI (detrusor instability)   . Fibroid   . Rectocele   . Atrophic vaginitis   . 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 Diabetes mellitus     Type 2  . Peripheral neuropathy   . Hypertension   . Dysrhythmia     cardioversion - 2012  . Complication of anesthesia 1980's    post anesth.- states she "went into resp. arrest" , but then remarked that she  thought maybe they gave her too much medicine (morphine)   . Shortness of breath     sometimes   . 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 Anemia   . 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 CPAP (continuous positive airway pressure) dependence   . GERD (gastroesophageal reflux disease)   . Hearing loss   . Sleep apnea     uses CPAP everynight- last study in [place] 5 yrs. or more   . Severe sepsis with septic shock 01/21/2014  . E coli bacteremia 01/20/2014    Inpatient warfarin doses administered from 11/20: 5mg  at home prior to admission, 2.5mg  (11/21), 5mg  (11/22), 0mg  (11/23-11/26), 2.5mg  (11/27-28), held 11/29-11/30  Assessment: 73 yoF admitted with sepsis secondary to HCAP following first course of chemotherapy for NHL. On chronic Warfarin for Afib: home dose 2.5mg  on Wed, Sat with 5mg  other days.  Admit INR 2.20, in therapeutic range.    Goal of Therapy:  INR 2-3  Today, 01/29/2014:  INR 3.13, still supratherapeutic but improved from yesterday.  Patient's INR has  been much more sensitive to warfarin this admission and is elevated despite low doses.  Contributing factors likely low oral intake, diarrhea, and s/p septic shock.  Pltc 197K post-chemotherapy, improving  Hgb low, stable post-chemotherapy.  No bleeding reported  No current significant drug interactions with warfarin  Diet: cardiac diet started 11/28, still poor intake, previously NPO  Plan: 1. Continue to hold warfarin due to SUPRAtherapeutic INR 2. Follow daily PT/INR 3. Follow CBC - following pltc post-chemotherapy 4. Follow clinical course and any reports of bleeding.   Adrian Saran, PharmD, BCPS Pager [phone] 01/29/2014 8:27 AM

## 2014-01-29 NOTE — Progress Notes (Signed)
Clinical Social Work Department BRIEF PSYCHOSOCIAL ASSESSMENT 01/29/2014  Patient:  Patty [name], Patty [name]     Account Number:  [id number]     Admit date:  01/18/2014  Clinical Social Worker:  Maryln Manuel  Date/Time:  01/29/2014 01:30 PM  Referred by:  Physician  Date Referred:  01/29/2014 Referred for  SNF Placement   Other Referral:   Interview type:  Patient Other interview type:   and patient spouse and pt son-in-law at bedside    PSYCHOSOCIAL DATA Living Status:  Parole Admitted from facility:   Level of care:   Primary support name:  Herbie Baltimore Axtman/[phone] Primary support relationship to patient:  SPOUSE Degree of support available:   strong    CURRENT CONCERNS Current Concerns  Post-Acute Placement   Other Concerns:    SOCIAL WORK ASSESSMENT / PLAN CSW received referral for New SNF.    CSW met with pt, pt husband, and pt son-in-law at bedside. CSW introduced self and explained role. Pt discussed that she lives at home with her husband. CSW discussed recommendation for SNF for rehab before returning home. Pt expressed hesitancy about SNF for rehab and is not yet ready to make a final decision. CSW expressed understanding and explained process of SNF search and that it may be helpful for CSW to initiate search in order for pt to have options to make decision. Pt agreeable to [facility] search. Pt expressed interest in [facility]. CSW discussed that [facility] would be included in search and CSW will contact facility to notify of interest. CSW discussed that with pt outpatient chemotherapy some facilities may not accept pt due to cost. Pt spouse states that he would be able to transport pt if transportation is an issue. CSW provided support to pt and clarified pt and pt family questions and concerns.    CSW completed FL2 and initiated SNF search to [facility]. CSW contacted [facility] and notified facility of pt interest.  [facility] plans to review pt clinical information.    CSW to follow up with pt and pt family re: SNF bed offers.    CSW to continue to follow to provide support and offer pt options for disposition in order for pt to make decision.   Assessment/plan status:  Psychosocial Support/Ongoing Assessment of Needs Other assessment/ plan:   discharge planning   Information/referral to community resources:   [facility] search    PATIENT'S/FAMILY'S RESPONSE TO PLAN OF CARE: Pt alert and oriented x 4. Pt hesitant about SNF, but agreeable to explore option in order to discuss with pt family and make decision regarding disposition planning. Pt has supportive family who are actively involved in pt care.    Alison Murray, MSW, [place] Work [phone]

## 2014-01-29 NOTE — Progress Notes (Addendum)
Clinical Social Work Department CLINICAL SOCIAL WORK PLACEMENT NOTE 01/29/2014  Patient:  MARESA, MORASH  Account Number:  [id number] Admit date:  01/18/2014  Clinical Social Worker:  Maryln Manuel  Date/time:  01/29/2014 01:56 PM  Clinical Social Work is seeking post-discharge placement for this patient at the following level of care:   SKILLED NURSING   (*CSW will update this form in Epic as items are completed)   01/29/2014  Patient/family provided with [place] Department of Clinical Social Work's list of facilities offering this level of care within the geographic area requested by the patient (or if unable, by the patient's family).  01/29/2014  Patient/family informed of their freedom to choose among providers that offer the needed level of care, that participate in Medicare, Medicaid or managed care program needed by the patient, have an available bed and are willing to accept the patient.  01/29/2014  Patient/family informed of [facility]' ownership interest in [facility], as well as of the fact that they are under no obligation to receive care at this facility.  PASARR submitted to EDS on 01/29/2014 PASARR number received on 01/29/2014  FL2 transmitted to all facilities in geographic area requested by pt/family on  01/29/2014 FL2 transmitted to all facilities within larger geographic area on   Patient informed that his/her managed care company has contracts with or will negotiate with  certain facilities, including the following:     Patient/family informed of bed offers received:  01/30/2014 Patient chooses bed at [facility] and [place] recommends and patient chooses bed at    Patient to be transferred to  on  [facility] on 02/01/2014 Patient to be transferred to facility by ambulance [facility]) Patient and family notified of transfer on 02/01/2014 Name of family member notified:  Pt notified at bedside and pt husband  was aware of transfer from visit this morning.   The following physician request were entered in Epic:   Additional Comments:   Alison Murray, MSW, [place] Work [phone]

## 2014-01-29 NOTE — Progress Notes (Signed)
Patty [name]   DOB:07/25/1940   ZO#:[id number]   JWJ#:[id number]  Patient Care Team: Horatio Pel, MD as PCP - General (Internal Medicine) Laverda Page, MD as Consulting Physician (Cardiology) I have seen the patient, examined her and edited the notes as follows  Subjective: Patient seen and examined. She is now at the Oncology Unit. She is on CPAP.  She denies any chest pain. Her main complaint is nausea, with one episode of emesis last night. She has known left lower quadrant pain.Last bowel movement on 11/30. Denies dysuria or hematuria. Wants to increase mobility.   Scheduled Meds: . acyclovir  400 mg Oral Daily  . allopurinol  300 mg Oral Daily  . arformoterol  15 mcg Nebulization BID  . budesonide (PULMICORT) nebulizer solution  0.25 mg Nebulization BID  . cefTRIAXone (ROCEPHIN)  IV  1 g Intravenous Q24H  . cholecalciferol  2,000 Units Oral Q lunch  . digoxin  125 mcg Oral Daily  . ezetimibe  10 mg Oral Daily  . insulin aspart  0-5 Units Subcutaneous QHS  . insulin glargine  10 Units Subcutaneous Daily  . levothyroxine  250 mcg Oral QAC breakfast  . magic mouthwash w/lidocaine  5 mL Oral QID  . magnesium oxide  400 mg Oral QHS  . montelukast  10 mg Oral Daily  . pantoprazole  40 mg Oral Daily  . potassium chloride  40 mEq Oral BID  . pravastatin  20 mg Oral Once per day on Mon Tue Wed Thu  . sodium chloride  10 mL Intravenous Q12H  . sodium chloride  3 mL Intravenous Q12H  . triamterene-hydrochlorothiazide  1 tablet Oral q morning - 10a  . Warfarin - Pharmacist Dosing Inpatient   Does not apply q1800   Continuous Infusions: . sodium chloride 1,000 mL (01/27/14 0758)   PRN Meds:albuterol, alum & mag hydroxide-simeth, dicyclomine, fentaNYL, hydrALAZINE, LORazepam, ondansetron (ZOFRAN) IV, promethazine, traMADol-acetaminophen   Objective:  Filed Vitals:   01/29/14 0513  BP: 162/76  Pulse: 79  Temp: 97.5 F (36.4 C)  Resp: 22      Intake/Output Summary  (Last 24 hours) at 01/29/14 6213 Last data filed at 01/29/14 0865  Gross per 24 hour  Intake    790 ml  Output   1925 ml  Net  -1135 ml    ECOG PERFORMANCE STATUS: 3-4  GENERAL: Ill appearing, alert and oriented. SKIN: skin color, texture, turgor are pale, no rashes or significant lesions OROPHARYNX:cannot assess patient on CPAP at this time NECK: supple, thyroid normal size, non-tender, without nodularity LYMPH:  no palpable lymphadenopathy in the cervical, axillary or inguinal LUNGS: essentially clear to auscultation bilaterally.  Right port appears nontender HEART:irregular rate & rhythm and no murmurs, minimal bilateral lower extremity edema. Mechanical devices present ABDOMEN: abdomen soft, non tender to palpation, and normal bowel sounds Musculoskeletal: no cyanosis of digits and no clubbing  NEURO: No focal motor/sensory deficits     CBG (last 3)   Recent Labs  01/28/14 1222 01/28/14 1714 01/28/14 2033  GLUCAP 193* 117* 115*     Labs:   Recent Labs Lab 01/23/14 0410 01/24/14 0430 01/25/14 0430 01/26/14 0445 01/27/14 0400  WBC 24.0* 14.2* 16.6* 17.3* 14.6*  HGB 8.9* 8.6* 8.5* 8.7* 8.4*  HCT 26.0* 26.0* 27.1* 27.8* 27.6*  PLT 100* 81* 116* 192 197  MCV 81.0 83.6 85.8 87.7 89.9  MCH 27.7 27.7 26.9 27.4 27.4  MCHC 34.2 33.1 31.4 31.3 30.4  RDW 15.5 15.4 15.2  15.4 15.3  LYMPHSABS  --  0.4* 0.5* 0.5*  --   MONOABS  --  0.4 0.7 1.3*  --   EOSABS  --  0.2 0.2 0.7  --   BASOSABS  --  0.0 0.0 0.0  --      Chemistries:    Recent Labs Lab 01/23/14 0410 01/24/14 0430 01/25/14 0430 01/26/14 0445 01/27/14 0400  NA 138 142 143 148* 150*  K 3.4* 3.3* 3.4* 2.8* 3.0*  CL 99 101 102 103 104  CO2 _0 40*  GLUCOSE 228* 133* 129* 117* 141*  BUN 62* 62* 61* 52* 33*  CREATININE 2.01* 1.58* 1.45* 1.24* 0.97  CALCIUM 8.0* 8.2* 8.5 8.6 8.7  MG 2.1 1.9 2.2 1.8 1.9  AST  --   --   --   --  15  ALT  --   --   --   --  15  ALKPHOS  --   --   --   --  67   BILITOT  --   --   --   --  0.4    GFR Estimated Creatinine Clearance: 46.9 mL/min (by C-G formula based on Cr of 0.97).  Liver Function Tests:  Recent Labs Lab 01/27/14 0400  AST 15  ALT 15  ALKPHOS 67  BILITOT 0.4  PROT 5.2*  ALBUMIN 2.5*    Recent Labs Lab 01/26/14 1650  LIPASE 8*   Coagulation profile  Recent Labs Lab 01/25/14 0430 01/26/14 0445 01/27/14 0400 01/28/14 0613 01/29/14 0501  INR 2.82* 2.78* 3.82* 3.87* 3.13*    Cardiac Enzymes:  Recent Labs Lab 01/23/14 0828 01/26/14 1650 01/26/14 2300 01/27/14 0801 01/27/14 1700  TROPONINI <0.30 <0.30 <0.30 <0.30 <0.30   BNP: Invalid input(s): POCBNP CBG:  Recent Labs Lab 01/28/14 0339 01/28/14 0808 01/28/14 1222 01/28/14 1714 01/28/14 2033  GLUCAP 108* 109* 193* 117* 115*     Imaging Studies:  No results found.   Assessment/Plan: 73 y.o.  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 status post cycle 1 of chemotherapy with bendamustine and rituximab on 01/09/2014-bendamustine on day 1 and 2 with rituximab on day 1 and q 28 days, for planned 6 cycles for curative intent Neulasta support was given on 11/13. Continue aggressive supportive care.  Anemia Thrombocytopenia, resolved This is likely anemia of chronic disease and recent chemotherapy No bleeding issues reported overnight such as epistaxis, hematuria or hematochezia.  Thrombocytopenia resolved on 11/28 She does not require transfusion now unless hemoglobin less than 8 g, and platelets less than 10,000 or 20,000 if acutely bleeding  Leukocytosis In the setting of Lymphoma, recent Neulasta on 11/13, steroids, inflammation,pain, fever, and dyspnea This is trending down to normal  At high risk of tumor lysis syndrome, renal failure from recent sepsis On fluid hydration and allopurinol. Renal functions improved.  Abdominal distention  Diarrhea May be due to recent  chemotherapy and antibiotics  C. difficile was negative diarrhea resolved   Escherichia coli in urine and in blood with renal failure Klebsiella in Urine On Ceftriaxone IV, day 11 of 14  Acute respiratory failure initially due to inability to compensate for met acidosis,pulmonary edema, septic shock, bradycardia, hypernatremia and acute oliguric renal failure Resolved. Appreciate Critical Care involvement  Atrial fibrillation Her most recent echocardiogram show preserved ejection fraction. On Cardizem, Digoxin And Coumadin Recommend close follow-up and INR monitoring. Pharmacy is following  DVT prophylaxis On Coumadin and mechanical devices  Appreciate Pharmacy involvement  Nausea In the setting of recent infections, Diabetes, and recent GI losses that caused electrolyte imbalances and dehydration Consider Zofran to be placed around the clock in addition to scheduled antiemetics. Avoiding oral potassium replacement  Full Code  Disposition: Not ready for discharge until clinically stable and symptoms such as nausea are better controlled. Will continue to follow  Other medical issues as per admitting team   **Disclaimer: This note was dictated with voice recognition software. Similar sounding words can inadvertently be transcribed and this note may contain transcription errors which may not have been corrected upon publication of note.Sharene Butters E, PA-C 01/29/2014  7:21 AM Patty Grismore, MD 01/29/2014

## 2014-01-29 NOTE — Evaluation (Signed)
Occupational Therapy Evaluation Patient Details Name: Patty [name] MRN: [id number] DOB: November 24, 1940 Today's Date: 01/29/2014    History of Present Illness Patty [name] is a 73 y.o. 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 Pt would benefit from cont OT to increase I and efficiency with basic adls so she can eventually d/c home with her husband and assist in caring for herself.    Follow Up Recommendations  SNF;Supervision/Assistance - 24 hour    Equipment Recommendations  3 in 1 bedside comode    Recommendations for Other Services       Precautions / Restrictions Precautions Precautions: Fall Restrictions Weight Bearing Restrictions: No      Mobility Bed Mobility Overal bed mobility: Needs Assistance Bed Mobility: Rolling;Sidelying to Sit Rolling: Supervision Sidelying to sit: Min assist       General bed mobility comments: pt able to mobilize in bed fairly well.  Did need assist maintaining balance on EOB>  Transfers Overall transfer level: Needs assistance Equipment used: 1 person hand held assist Transfers: Sit to/from [employer] Transfers Sit to Stand: Mod assist Stand pivot transfers: Mod assist       General transfer comment: pt with posterior lean in standing but was able to fully stand today. Once weight shifted to balls of feet, pt felt as if she was falling.    Balance Overall balance assessment: Needs assistance Sitting-balance support: Bilateral upper extremity supported;Feet supported Sitting balance-Leahy Scale: Fair     Standing balance support: Bilateral upper extremity supported;During functional activity Standing balance-Leahy Scale: Zero Standing  balance comment: Pt unable to find midline and stand w/o assist.                            ADL Overall ADL's : Needs assistance/impaired Eating/Feeding: Set up;Sitting   Grooming: Wash/dry face;Wash/dry hands;Oral care;Brushing hair;Set up;Sitting Grooming Details (indicate cue type and reason): Pt can groom in sitting.  Too fatigued to groom in standing at sink. Upper Body Bathing: Set up;Sitting Upper Body Bathing Details (indicate cue type and reason): extra time and a lot of rest breaks.  pt very fatigued adn with N/V Lower Body Bathing: Moderate assistance;Sit to/from stand Lower Body Bathing Details (indicate cue type and reason): assist standing.  Pt unable to stand unassisted.  Pt unable to let go of walker with one hand to bathe while standing. Upper Body Dressing : Minimal assistance;Sitting   Lower Body Dressing: Maximal assistance;Sit to/from stand Lower Body Dressing Details (indicate cue type and reason): Pt with poor activity tolerance.  pt too tired to attempt much of her LE dressing.  Pt needs assist when in standing to manage pulling pants up.  Pt too fatigued to cross legs over to attempt to put on socks. Toilet Transfer: Moderate assistance;Stand-pivot;BSC Toilet Transfer Details (indicate cue type and reason): Pt with posterior lean while in standing.  Unable to find midline.  When weight shifted to balls of feet, pt felt as if she was falling. Toileting- Clothing Manipulation and Hygiene: Total assistance;Sit to/from stand;+2 for physical assistance Toileting - Clothing Manipulation Details (indicate cue type and reason): 2 needed for assist.  One to stand with  pt and one to clean pt.     Functional mobility during ADLs: Moderate assistance General ADL Comments: Pt very weak and very deconditioned and just could not tolerate much activity.  Once strength returns, feel pt may be able to complete more adls.     Vision                      Perception     Praxis      Pertinent Vitals/Pain Pain Assessment: No/denies pain     Hand Dominance Right   Extremity/Trunk Assessment Upper Extremity Assessment Upper Extremity Assessment: Generalized weakness   Lower Extremity Assessment Lower Extremity Assessment: Defer to PT evaluation   Cervical / Trunk Assessment Cervical / Trunk Assessment: Normal   Communication Communication Communication: No difficulties   Cognition Arousal/Alertness: Awake/alert Behavior During Therapy: WFL for tasks assessed/performed Overall Cognitive Status: Within Functional Limits for tasks assessed                     General Comments       Exercises       Shoulder Instructions      Home Living   Living Arrangements: Spouse/significant other                                      Prior Functioning/Environment Level of Independence: Independent        Comments: family states pt was very I pta.    OT Diagnosis: Generalized weakness   OT Problem List: Decreased strength;Decreased activity tolerance;Impaired balance (sitting and/or standing);Decreased knowledge of use of DME or AE   OT Treatment/Interventions: Self-care/ADL training;DME and/or AE instruction;Therapeutic activities    OT Goals(Current goals can be found in the care plan section) Acute Rehab OT Goals Patient Stated Goal: to get stronger OT Goal Formulation: With patient/family Time For Goal Achievement: 02/12/14 Potential to Achieve Goals: Good ADL Goals Pt Will Perform Grooming: with min guard assist;standing Pt Will Perform Lower Body Bathing: with min guard assist;sit to/from stand Pt Will Perform Lower Body Dressing: with min guard assist;sit to/from stand Pt Will Transfer to Toilet: with min guard assist;ambulating;bedside commode  OT Frequency: Min 2X/week   Barriers to D/C:    not sure husband can handle pt alone at home.       Co-evaluation              End of  Session Nurse Communication: Mobility status  Activity Tolerance: Patient limited by fatigue Patient left: in chair;with call bell/phone within reach;with family/visitor present   Time: 1300-1320 OT Time Calculation (min): 20 min Charges:  OT General Charges $OT Visit: 1 Procedure OT Evaluation $Initial OT Evaluation Tier I: 1 Procedure OT Treatments $Self Care/Home Management : 8-22 mins G-Codes:    Glenford Peers February 26, 2014, 1:35 PM  [phone]

## 2014-01-29 NOTE — Plan of Care (Signed)
Problem: Phase II Progression Outcomes Goal: Encourage coughing & deep breathing Outcome: Completed/Met Date Met:  01/29/14

## 2014-01-30 ENCOUNTER — Other Ambulatory Visit: Payer: Self-pay | Admitting: Hematology and Oncology

## 2014-01-30 DIAGNOSIS — C8591 Non-Hodgkin lymphoma, unspecified, lymph nodes of head, face, and neck: Secondary | ICD-10-CM

## 2014-01-30 DIAGNOSIS — K1231 Oral mucositis (ulcerative) due to antineoplastic therapy: Secondary | ICD-10-CM

## 2014-01-30 DIAGNOSIS — E876 Hypokalemia: Secondary | ICD-10-CM

## 2014-01-30 LAB — GLUCOSE, CAPILLARY
GLUCOSE-CAPILLARY: 117 mg/dL — AB (ref 70–99)
GLUCOSE-CAPILLARY: 131 mg/dL — AB (ref 70–99)
GLUCOSE-CAPILLARY: 106 mg/dL — AB (ref 70–99)
GLUCOSE-CAPILLARY: 151 mg/dL — AB (ref 70–99)

## 2014-01-30 LAB — PROTIME-INR
INR: 2.28 — ABNORMAL HIGH (ref 0.00–1.49)
Prothrombin Time: 25.4 seconds — ABNORMAL HIGH (ref 11.6–15.2)

## 2014-01-30 LAB — BASIC METABOLIC PANEL
Anion gap: 7 (ref 5–15)
BUN: 16 mg/dL (ref 6–23)
CO2: 35 mEq/L — ABNORMAL HIGH (ref 19–32)
Calcium: 8.3 mg/dL — ABNORMAL LOW (ref 8.4–10.5)
Chloride: 100 mEq/L (ref 96–112)
Creatinine, Ser: 0.67 mg/dL (ref 0.50–1.10)
GFR calc Af Amer: >90 mL/min (ref >90)
GFR, EST NON AFRICAN AMERICAN: 84 mL/min — AB (ref >90)
GLUCOSE: 111 mg/dL — AB (ref 70–99)
POTASSIUM: 3.1 meq/L — AB (ref 3.7–5.3)
Sodium: 142 mEq/L (ref 137–147)

## 2014-01-30 LAB — CBC
HCT: 27.2 % — ABNORMAL LOW (ref 36.0–46.0)
Hemoglobin: 8.6 g/dL — ABNORMAL LOW (ref 12.0–15.0)
MCH: 28 pg (ref 26.0–34.0)
MCHC: 31.6 g/dL (ref 30.0–36.0)
MCV: 88.6 fL (ref 78.0–100.0)
PLATELETS: 179 10*3/uL (ref 150–400)
RBC: 3.07 MIL/uL — AB (ref 3.87–5.11)
RDW: 16.4 % — AB (ref 11.5–15.5)
WBC: 15.1 10*3/uL — ABNORMAL HIGH (ref 4.0–10.5)

## 2014-01-30 LAB — MAGNESIUM: Magnesium: 1.4 mg/dL — ABNORMAL LOW (ref 1.5–2.5)

## 2014-01-30 MED ORDER — WARFARIN SODIUM 3 MG PO TABS
3.0000 mg | ORAL_TABLET | Freq: Once | ORAL | Status: AC
  Administered 2014-01-30: 3 mg via ORAL
  Filled 2014-01-30: qty 1

## 2014-01-30 MED ORDER — POTASSIUM CHLORIDE 10 MEQ/100ML IV SOLN
10.0000 meq | INTRAVENOUS | Status: AC
  Administered 2014-01-30 (×2): 10 meq via INTRAVENOUS
  Filled 2014-01-30 (×2): qty 100

## 2014-01-30 MED ORDER — POTASSIUM CHLORIDE CRYS ER 20 MEQ PO TBCR
20.0000 meq | EXTENDED_RELEASE_TABLET | Freq: Two times a day (BID) | ORAL | Status: DC
  Filled 2014-01-30 (×2): qty 1

## 2014-01-30 NOTE — Progress Notes (Signed)
CSW continuing to follow for disposition planning.   CSW met with pt, pt husband, and pt son-in-law at bedside.   CSW provided support as pt discussed that she was very eager to get up and walk and get up to chair. Pt shared that MD's were encouraging this as well, but she has not yet been able to get up. CSW reviewed chart and noted that PT is schedule to see pt today and CSW notified pt and pt family.   CSW discussed with pt and pt family SNF bed offers. Only option at this time is [facility]. Pt states that if she is unable to progress enough to return home then she would be agreeable to [facility].   CSW paged PT per pt and pt family request to determine what time PT would see pt and CSW received return phone call from PT and PT was going to come to pt room to provide PT treatment immediately. CSW thanked PT for their quick response as pt is eager to participate.  CSW discussed with pt and pt family that CSW is going to notify [facility] of acceptance of bed offer and for pt and pt family to notify this CSW if they change their mind and wish for pt to return home.   CSW contacted [facility] and notified facility of pt acceptance of bed offer.   Pt and pt family appreciative of CSW visit and support.  CSW to continue to follow to provide support and assist with pt disposition needs.   Alison Murray, MSW, [place] Work [phone]

## 2014-01-30 NOTE — Progress Notes (Signed)
RT placed pt on auto titrate CPAP 5-15cmH2O via home nasal pillows. Sterile water was added for humidification. Pt states she is comfortable and is tolerating CPAP well at this time. RT will continue to monitor as needed.

## 2014-01-30 NOTE — Progress Notes (Signed)
Patty [name]   DOB:1940/08/10   UK#:[id number]   BJS#:[id number]  Patient Care Team: Horatio Pel, MD as PCP - General (Internal Medicine) Laverda Page, MD as Consulting Physician (Cardiology)  I have seen the patient, examined her and edited the notes as follows  Subjective: Patient seen and examined. She reports feeling better than prior day. Nausea better controlled with Zofran and Phenergan. Denies vomiting. No diarrhea, last bowel movement during the night. Advancing oral intake without complications. She denies any chest pain or shortness of breath. Uses CPAP intermittently.Denies dysuria or hematuria. Denies pain. Mobilizing from bed to chair with assistance.Wishes to increase ambulation.  Scheduled Meds: . arformoterol  15 mcg Nebulization BID  . budesonide (PULMICORT) nebulizer solution  0.25 mg Nebulization BID  . cefTRIAXone (ROCEPHIN)  IV  1 g Intravenous Q24H  . cholecalciferol  2,000 Units Oral Q lunch  . digoxin  125 mcg Oral Daily  . ezetimibe  10 mg Oral Daily  . insulin aspart  0-5 Units Subcutaneous QHS  . insulin glargine  10 Units Subcutaneous Daily  . levothyroxine  250 mcg Oral QAC breakfast  . magic mouthwash w/lidocaine  5 mL Oral QID  . magnesium oxide  400 mg Oral QHS  . montelukast  10 mg Oral Daily  . pantoprazole  40 mg Oral Daily  . pravastatin  20 mg Oral Once per day on Mon Tue Wed Thu  . sodium chloride  10 mL Intravenous Q12H  . sodium chloride  3 mL Intravenous Q12H  . Warfarin - Pharmacist Dosing Inpatient   Does not apply q1800   Continuous Infusions: . 0.9 % NaCl with KCl 40 mEq / L 10 mL/hr (01/29/14 1513)   PRN Meds:albuterol, alum & mag hydroxide-simeth, dicyclomine, fentaNYL, hydrALAZINE, LORazepam, ondansetron (ZOFRAN) IV, promethazine, traMADol-acetaminophen   Objective:  Filed Vitals:   01/30/14 0545  BP: 158/53  Pulse: 67  Temp: 98.5 F (36.9 C)  Resp: 20      Intake/Output Summary (Last 24 hours) at 01/30/14  6073 Last data filed at 01/29/14 2300  Gross per 24 hour  Intake    300 ml  Output   2075 ml  Net  -1775 ml    ECOG PERFORMANCE STATUS: 2-3  GENERAL: Ill appearing, alert and oriented.Conversant this morning SKIN: skin color, texture, turgor are pale, no rashes or significant lesions OROPHARYNX: oral mucosa normal without lesion or thrush.  NECK: supple, thyroid normal size, non-tender, without nodularity LYMPH:  no palpable lymphadenopathy in the cervical, axillary or inguinal LUNGS: essentially clear to auscultation bilaterally.  Right port appears nontender HEART:irregular rate & rhythm and no murmurs,no lower extremity edema. Mechanical devices present ABDOMEN: abdomen soft, non tender to palpation, and normal bowel sounds Musculoskeletal: no cyanosis of digits and no clubbing  NEURO: No focal motor/sensory deficits     CBG (last 3)   Recent Labs  01/29/14 1145 01/29/14 1558 01/29/14 2223  GLUCAP 136* 139* 127*     Labs:   Recent Labs Lab 01/24/14 0430 01/25/14 0430 01/26/14 0445 01/27/14 0400  WBC 14.2* 16.6* 17.3* 14.6*  HGB 8.6* 8.5* 8.7* 8.4*  HCT 26.0* 27.1* 27.8* 27.6*  PLT 81* 116* 192 197  MCV 83.6 85.8 87.7 89.9  MCH 27.7 26.9 27.4 27.4  MCHC 33.1 31.4 31.3 30.4  RDW 15.4 15.2 15.4 15.3  LYMPHSABS 0.4* 0.5* 0.5*  --   MONOABS 0.4 0.7 1.3*  --   EOSABS 0.2 0.2 0.7  --   BASOSABS 0.0 0.0  0.0  --      Chemistries:    Recent Labs Lab 01/24/14 0430 01/25/14 0430 01/26/14 0445 01/27/14 0400 01/29/14 1145 01/30/14 0459  NA 142 143 148* 150* 147 142  K 3.3* 3.4* 2.8* 3.0* 3.1* 3.1*  CL 101 102 103 104 103 100  CO2 _0 40* 37* 35*  GLUCOSE 133* 129* 117* 141* 147* 111*  BUN 62* 61* 52* 33* 19 16  CREATININE 1.58* 1.45* 1.24* 0.97 0.72 0.67  CALCIUM 8.2* 8.5 8.6 8.7 8.5 8.3*  MG 1.9 2.2 1.8 1.9 1.5  --   AST  --   --   --  15  --   --   ALT  --   --   --  15  --   --   ALKPHOS  --   --   --  67  --   --   BILITOT  --   --   --   0.4  --   --     GFR Estimated Creatinine Clearance: 56.9 mL/min (by C-G formula based on Cr of 0.67).  Liver Function Tests:  Recent Labs Lab 01/27/14 0400  AST 15  ALT 15  ALKPHOS 67  BILITOT 0.4  PROT 5.2*  ALBUMIN 2.5*    Recent Labs Lab 01/26/14 1650  LIPASE 8*   Coagulation profile  Recent Labs Lab 01/26/14 0445 01/27/14 0400 01/28/14 0613 01/29/14 0501 01/30/14 0459  INR 2.78* 3.82* 3.87* 3.13* 2.28*    Cardiac Enzymes:  Recent Labs Lab 01/23/14 0828 01/26/14 1650 01/26/14 2300 01/27/14 0801 01/27/14 1700  TROPONINI <0.30 <0.30 <0.30 <0.30 <0.30   BNP: Invalid input(s): POCBNP CBG:  Recent Labs Lab 01/28/14 2033 01/29/14 0741 01/29/14 1145 01/29/14 1558 01/29/14 2223  GLUCAP 115* 118* 136* 139* 127*     Imaging Studies:  No results found.   Assessment/Plan: 73 y.o.  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 status post cycle 1 of chemotherapy with bendamustine and rituximab on 01/09/2014-bendamustine on day 1 and 2 with rituximab on day 1 and q 28 days, for planned 6 cycles for curative intent Neulasta support was given on 11/13. Continue aggressive supportive care.  Anemia Thrombocytopenia, resolved This is likely anemia of chronic disease and recent chemotherapy No bleeding issues reported overnight such as epistaxis, hematuria or hematochezia.  Thrombocytopenia resolved on 11/28 She does not require transfusion now unless hemoglobin less than 8 g, and platelets less than 10,000 or 20,000 if acutely bleeding  Leukocytosis In the setting of Lymphoma, recent Neulasta on 11/13, steroids, inflammation,and dyspnea This is trending down to normal  At high risk of tumor lysis syndrome, renal failure from recent sepsis She was placed on fluid hydration and allopurinol.  Renal functions improved. Allopurinol discontinued on 12/2 and IV fluids decreased.   Abdominal  distention  Diarrhea May be due to recent chemotherapy and antibiotics  C. difficile was negative diarrhea resolved   Escherichia coli in urine and in blood with renal failure Klebsiella in Urine On Ceftriaxone IV, day 12 of 14  Acute respiratory failure initially due to inability to compensate for met acidosis,pulmonary edema, septic shock, bradycardia, hypernatremia and acute oliguric renal failure Resolved. Appreciate Critical Care involvement  Atrial fibrillation Her most recent echocardiogram show preserved ejection fraction. On Cardizem, Digoxin And Coumadin Recommend close follow-up and INR monitoring. Pharmacy is following  DVT prophylaxis On Coumadin and mechanical devices Appreciate Pharmacy involvement  Nausea In  the setting of recent infections, Diabetes, and recent GI losses that caused electrolyte imbalances and dehydration Zofran increased to 8 mg Iv g 6 hrs as needed with some improvement  Consider Zofran to be placed around the clock in addition to scheduled antiemetics. Avoiding oral potassium replacement  Malnutrition Dur to nausea and recent vomiting Tolerating supplemental intake and progressive oral intake Appreciate Nutrition involvement  Full Code  Disposition: Not ready for discharge until clinically stable and symptoms such as nausea are better controlled, likely next 48 hours. Will sign off. I will place tentative return appt for her to see me in 2 weeks on 12/16.  Other medical issues as per admitting team   **Disclaimer: This note was dictated with voice recognition software. Similar sounding words can inadvertently be transcribed and this note may contain transcription errors which may not have been corrected upon publication of note.Sharene Butters E, PA-C 01/30/2014  6:57 AM  Jeselle Hiser, MD 01/30/2014

## 2014-01-30 NOTE — Progress Notes (Signed)
ANTICOAGULATION CONSULT NOTE - Follow Up  Pharmacy Consult for Warfarin Indication: atrial fibrillation  Allergies  Allergen Reactions  . Morphine And Related Anaphylaxis and Other (See Comments)    Pt states she stopped breathing post op- "went into resp. arrest"  . Demerol Nausea And Vomiting  . Meperidine Hcl Nausea And Vomiting  . Multaq [Dronedarone] Other (See Comments)    Reaction:  Blood in urine and elevated liver enzymes.   Jeanie Cooks Allergy] Rash  . Penicillins Itching and Rash  . Sulfa Drugs Cross Reactors Rash   Patient Measurements: Height: 5\' 6"  (167.6 cm) Weight: 147 lb 4.3 oz (66.8 kg) IBW/kg (Calculated) : 59.3  Vital Signs: Temp: 98.5 F (36.9 C) (12/02 0545) Temp Source: Oral (12/02 0545) BP: 158/53 mmHg (12/02 0545) Pulse Rate: 67 (12/02 0545)  Labs:  Recent Labs  01/27/14 0801 01/27/14 1700 01/28/14 0613 01/29/14 0501 01/29/14 1145 01/30/14 0459  LABPROT  --   --  38.2* 32.4*  --  25.4*  INR  --   --  3.87* 3.13*  --  2.28*  CREATININE  --   --   --   --  0.72 0.67  TROPONINI <0.30 <0.30  --   --   --   --    Estimated Creatinine Clearance: 56.9 mL/min (by C-G formula based on Cr of 0.67).  Medical History: Past Medical History  Diagnosis Date  . Asthma 06/15/2010  . Hypothyroidism 06/15/2010  . Hyperlipemia 06/15/2010  . Atrial fibrillation 05/28/2010    Managed with rate control and coumadin  . Long-term (current) use of anticoagulants   . ACE-inhibitor cough   . DI (detrusor instability)   . Fibroid   . Rectocele   . Atrophic vaginitis   . 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 Diabetes mellitus     Type 2  . Peripheral neuropathy   . Hypertension   . Dysrhythmia     cardioversion - 2012  . Complication of anesthesia 1980's    post anesth.- states she "went into resp. arrest" , but then remarked that she thought maybe they gave her too much medicine (morphine)   . Shortness of breath     sometimes   .  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 Anemia   . 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 CPAP (continuous positive airway pressure) dependence   . GERD (gastroesophageal reflux disease)   . Hearing loss   . Sleep apnea     uses CPAP everynight- last study in [place] 5 yrs. or more   . Severe sepsis with septic shock 01/21/2014  . E coli bacteremia 01/20/2014    Inpatient warfarin doses administered from 11/20: 5mg  at home prior to admission on 11/20 2.5mg  (11/21), 5mg  (11/22), 0mg  (11/23-11/26), 2.5mg  (11/27-28), 0mg  (11/29-12/1)   Assessment: 23 yoF admitted with sepsis secondary to HCAP following first course of chemotherapy for NHL. On chronic Warfarin for Afib: home dose 2.5mg  on Wed, Sat with 5mg  other days.  Admit INR 2.20, in therapeutic range.    Goal of Therapy:  INR 2-3  Today, 01/30/2014:  INR 2.28, now within therapeutic range and with large drop from yesterday.  Patient's INR has been much more sensitive to warfarin this admission.  Contributing factors likely low oral intake, diarrhea, and s/p septic shock.  Pltc 197K on 11/29  Hgb low on 11/29  No bleeding reported  No current significant drug interactions with warfarin, noted patient on digoxin  Diet: cardiac diet started 11/28, still poor intake, previously NPO  Plan: 1. Warfarin 3mg  PO x1 tonight 2. Follow daily PT/INR 3. CBC now 4. Follow clinical course and any reports of bleeding.  Thank you for the consult.  Currie Paris, PharmD, BCPS Pager: [phone] Pharmacy: [phone] 01/30/2014 7:58 AM

## 2014-01-30 NOTE — Plan of Care (Signed)
Problem: Phase I Progression Outcomes Goal: Dyspnea controlled at rest Outcome: Completed/Met Date Met:  01/30/14 Goal: Pain controlled with appropriate interventions Outcome: Completed/Met Date Met:  01/30/14 Goal: Initial discharge plan identified Outcome: Progressing Goal: Hemodynamically stable Outcome: Progressing

## 2014-01-30 NOTE — Progress Notes (Addendum)
Progress Note   Patty [name] HFW:[id number] DOB: [birth year] DOA: 01/18/2014 PCP: Horatio Pel, MD   Brief Narrative:   Patty [name] is an 73 y.o. 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 Empiric antibiotics including vancomycin and cefepime were started for treatment of severe sepsis thought to be secondary to HCAP. Blood cultures grew Escherichia coli. Urine culture grew Escherichia coli and Klebsiella. Her hospital course was complicated with worsening respiratory failure and requirement for endotracheal intubation on 01/22/2014.  Patient was subsequently extubated 01/25/2014. [facility] assumed primary care starting 01/28/2014.  Assessment/Plan:   Principal Problem:   Severe sepsis with septic shock / Escherichia coli bacteremia  Initially treated with empiric vancomycin/cefepime.  Blood cultures positive for Escherichia coli, urine cultures positive for Escherichia coli and Klebsiella.  Follow-up cultures negative to date.  Currently being managed with IV Rocephin.  Severe sepsis/septic shock resolved.  Mobilize with PT evaluation for discharge planning.  Active Problems:   Atrial fibrillation / Long-term (current) use of anticoagulants  Continue digoxin and Coumadin per pharmacy.    Hyperlipemia  Continue zetia and Pravachol.    Hypothyroidism  Continue Synthroid.    Diabetes mellitus type 2, controlled, without complications CBGs 027-741 on insulin sensitive SSI and Lantus 10 units daily.    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 Alvy Bimler.  Status post 1 cycle of chemotherapy with bendamustine and rituximab on 01/09/2014.    Anemia  in neoplastic disease  Hemoglobin stable with no current indication for transfusion.    HCAP (healthcare-associated pneumonia)  Status post treatment with broad-spectrum antibiotics.  Source of sepsis likely from UTI given Escherichia coli bacteremia.    Thrombocytopenia  Secondary to chemotherapy, resolved.    Diarrhea  C. difficile negative.  Likely secondary to chemotherapy induced enteritis.    Acute encephalopathy  Likely from sepsis.    Hypokalemia  Continue to supplement.  Check magnesium.    Metabolic acidosis  Secondary to sepsis, resolved.    Acute renal failure  Secondary to sepsis, resolved.    Mucositis due to chemotherapy  Continue Magic mouthwash.      Acute respiratory failure with hypoxia / sleep apnea  Intubated 01/22/14-01/25/14.  Continue Brovana, Pulmicort, and albuterol as needed.  Continue supplemental oxygen to keep saturations greater than 90%.    DVT Prophylaxis  Anticoagulated with Coumadin.  Code Status: Full. Family Communication: Husband at bedside. Disposition Plan: Home when stable.   IV Access:    Port-A-Cath   Procedures and diagnostic studies:   Dg Chest Port 1 View 01/18/2014: 1. Left lower lobe airspace opacity. I do note that the patient had a prior left pleural effusion and some of this could be from layering effusion and atelectasis, but left lower lobe pneumonia is certainly not excluded. 2. Borderline cardiomegaly. 3. Atherosclerotic aortic arch. 4. Thoracic spondylosis.   Dg Chest Port 1 View 01/20/2014: LEFT pleural effusion with atelectasis versus consolidation in LEFT lower lobe. Enlargement of cardiac silhouette with pulmonary vascular congestion, cannot exclude minimal pulmonary edema.   Dg Chest Port 1 View 01/21/2014: Layering pleural effusions and basilar atelectasis. There may be superimposed pneumonia, especially at the left base.   Dg Chest Port 1 View 01/21/2014:  1. Persistent bilateral  pleural effusions and edema. 2. New right midlung atelectasis. 3. Little change in diminished aeration to both lung bases.   Dg Chest Port 1 View 01/26/2014 Stable bilateral lower lobe opacities   Dg Chest Port 1 View 01/25/2014 Bilateral pleural effusions left greater than right. Tubes and lines as described.    Medical Consultants:    Dr. Heath Lark, Oncology.  Anti-Infectives:    Vanco 11/21 >> 11/23  Cefepime 11/21 >> 11/23  Ceftriaxone 11/23 >> plan 14 days abx total (day 11 on 12/1)  Subjective:   Patty [name] denies dyspnea, significant.  Appetite improving.  Bowels moved last night, a bit loose.  Still with some nausea.  Vomited yesterday.    Objective:    Filed Vitals:   01/29/14 2000 01/29/14 2048 01/29/14 2115 01/30/14 0545  BP:  141/58  158/53  Pulse:  67  67  Temp:  98.4 F (36.9 C)  98.5 F (36.9 C)  TempSrc:  Oral  Oral  Resp:  20 18 20   Height:      Weight:      SpO2: 93% 94%  91%    Intake/Output Summary (Last 24 hours) at 01/30/14 2119 Last data filed at 01/29/14 2300  Gross per 24 hour  Intake    300 ml  Output   2075 ml  Net  -1775 ml    Exam: Gen:  NAD Cardiovascular:  Occasional skipped beat, No M/R/G Respiratory:  Lungs CTAB Gastrointestinal:  Abdomen soft, NT/ND, + BS Extremities:  No C/E/C, SCDs on.   Data Reviewed:    Labs: Basic Metabolic Panel:  Recent Labs Lab 01/24/14 0430 01/25/14 0430 01/26/14 0445 01/27/14 0400 01/29/14 1145 01/30/14 0459  NA 142 143 148* 150* 147 142  K 3.3* 3.4* 2.8* 3.0* 3.1* 3.1*  CL 101 102 103 104 103 100  CO2 28 29 31  40* 37* 35*  GLUCOSE 133* 129* 117* 141* 147* 111*  BUN 62* 61* 52* 33* 19 16  CREATININE 1.58* 1.45* 1.24* 0.97 0.72 0.67  CALCIUM 8.2* 8.5 8.6 8.7 8.5 8.3*  MG 1.9 2.2 1.8 1.9 1.5  --   PHOS 3.4 3.5 3.7 2.4  --   --    GFR Estimated Creatinine Clearance: 56.9 mL/min (by C-G formula based on Cr of 0.67). Liver Function Tests:  Recent Labs Lab  01/27/14 0400  AST 15  ALT 15  ALKPHOS 67  BILITOT 0.4  PROT 5.2*  ALBUMIN 2.5*    Recent Labs Lab 01/26/14 1650  LIPASE 8*   Coagulation profile  Recent Labs Lab 01/26/14 0445 01/27/14 0400 01/28/14 0613 01/29/14 0501 01/30/14 0459  INR 2.78* 3.82* 3.87* 3.13* 2.28*    CBC:  Recent Labs Lab 01/24/14 0430 01/25/14 0430 01/26/14 0445 01/27/14 0400  WBC 14.2* 16.6* 17.3* 14.6*  NEUTROABS 13.3* 15.3* 14.9*  --   HGB 8.6* 8.5* 8.7* 8.4*  HCT 26.0* 27.1* 27.8* 27.6*  MCV 83.6 85.8 87.7 89.9  PLT 81* 116* 192 197   Cardiac Enzymes:  Recent Labs Lab 01/23/14 0828 01/26/14 1650 01/26/14 2300 01/27/14 0801 01/27/14 1700  TROPONINI <0.30 <0.30 <0.30 <0.30 <0.30   BNP (last 3 results)  Recent Labs  01/20/14 1035  PROBNP 36152.0*   CBG:  Recent Labs Lab 01/28/14 2033 01/29/14 0741 01/29/14 1145 01/29/14 1558 01/29/14 2223  GLUCAP 115* 118* 136* 139* 127*   Sepsis Labs:  Recent Labs Lab 01/24/14 0415 01/24/14 0430 01/25/14 0430 01/26/14 0445 01/26/14 1650 01/27/14 0400  PROCALCITON 4.81  --   --   --   --   --   WBC  --  14.2* 16.6* 17.3*  --  14.6*  LATICACIDVEN  --   --   --   --  1.3  --    Microbiology Recent Results (from the past 240 hour(s))  Culture, blood (routine x 2)     Status: None   Collection Time: 01/20/14 10:35 AM  Result Value Ref Range Status   Specimen Description BLOOD RIGHT ARM  Final   Special Requests   Final    BOTTLES DRAWN AEROBIC AND ANAEROBIC 10 CC BOTH BOTTLES   Culture  Setup Time   Final    01/20/2014 15:08 Performed at [facility]    Culture   Final    NO GROWTH 5 DAYS Performed at [facility]    Report Status 01/26/2014 FINAL  Final  Culture, blood (routine x 2)     Status: None   Collection Time: 01/20/14 11:00 AM  Result Value Ref Range Status   Specimen Description BLOOD PAC  10 ML IN [facility] BOTTLE  Final   Special Requests Immunocompromised  Final   Culture  Setup Time    Final    01/20/2014 18:26 Performed at [facility]   Final    NO GROWTH 5 DAYS Performed at [facility]    Report Status 01/26/2014 FINAL  Final  MRSA PCR Screening     Status: None   Collection Time: 01/21/14  2:40 PM  Result Value Ref Range Status   MRSA by PCR NEGATIVE NEGATIVE Final    Comment:        The GeneXpert MRSA Assay (FDA approved for NASAL specimens only), is one component of a comprehensive MRSA colonization surveillance program. It is not intended to diagnose MRSA infection nor to guide or monitor treatment for MRSA infections.      Medications:   . arformoterol  15 mcg Nebulization BID  . budesonide (PULMICORT) nebulizer solution  0.25 mg Nebulization BID  . cefTRIAXone (ROCEPHIN)  IV  1 g Intravenous Q24H  . cholecalciferol  2,000 Units Oral Q lunch  . digoxin  125 mcg Oral Daily  . ezetimibe  10 mg Oral Daily  . insulin aspart  0-5 Units Subcutaneous QHS  . insulin glargine  10 Units Subcutaneous Daily  . levothyroxine  250 mcg Oral QAC breakfast  . magic mouthwash w/lidocaine  5 mL Oral QID  . magnesium oxide  400 mg Oral QHS  . montelukast  10 mg Oral Daily  . pantoprazole  40 mg Oral Daily  . pravastatin  20 mg Oral Once per day on Mon Tue Wed Thu  . sodium chloride  10 mL Intravenous Q12H  . sodium chloride  3 mL Intravenous Q12H  . Warfarin - Pharmacist Dosing Inpatient   Does not apply q1800   Continuous Infusions: . 0.9 % NaCl with KCl 40 mEq / L 10 mL/hr (01/29/14 1513)    Time spent: 25 minutes.   LOS: 12 days   [facility] Pager [phone]. If unable to reach me by pager, please call my cell phone at [phone].  *Please refer to [email or web address], password [email or web address] to get updated schedule on who will round on this patient, as hospitalists switch teams weekly. If 7PM-7AM, please contact night-coverage at [email or web address], password [email or web address] for any overnight needs.  01/30/2014, 7:08 AM

## 2014-01-30 NOTE — Plan of Care (Signed)
Problem: Symptom Management Goal: Tolerates diet without nausea/vomiting Outcome: Progressing Pt with decreased n/v today with PRN pain administrations and decreased po med intake.

## 2014-01-30 NOTE — Progress Notes (Signed)
Physical Therapy Treatment Patient Details Name: Patty [name] MRN: [id number] DOB: 1940/12/14 Today's Date: 01/30/2014    History of Present Illness 73 y.o. 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  Pt also performed LE exercises in recliner to tolerance with breaks as well.  Pt pleased to be ambulating today and very motivated.   Follow Up Recommendations  SNF     Equipment Recommendations  Rolling walker with 5" wheels    Recommendations for Other Services       Precautions / Restrictions Precautions Precautions: Fall    Mobility  Bed Mobility Overal bed mobility: Needs Assistance Bed Mobility: Supine to Sit     Supine to sit: Min assist;HOB elevated     General bed mobility comments: HOB close to 90* upon entering room, assist with weight shifting to EOB  Transfers Overall transfer level: Needs assistance Equipment used: Rolling walker (2 wheeled) Transfers: Sit to/from Stand Sit to Stand: Min assist Stand pivot transfers: Min assist       General transfer comment: verbal cues for hand placement, increased time to find COG due to posterior lean upon standing, performed multiple times due to taking seated rest breaks with ambulation  Ambulation/Gait Ambulation/Gait assistance: Min assist Ambulation Distance (Feet): 31 Feet (total) Assistive device: Rolling walker (2 wheeled) Gait Pattern/deviations: Step-through pattern;Decreased stride length;Narrow base of support     General Gait Details: 6x1, 10x1, 15x1 with seated rest breaks required due to fatigue   Stairs            Wheelchair Mobility    Modified Rankin (Stroke Patients  Only)       Balance                                    Cognition Arousal/Alertness: Awake/alert Behavior During Therapy: WFL for tasks assessed/performed Overall Cognitive Status: Within Functional Limits for tasks assessed                      Exercises General Exercises - Lower Extremity Ankle Circles/Pumps: AROM;Seated;Both;10 reps Gluteal Sets: AROM;Both;10 reps Long Arc Quad: AROM;Seated;Both;10 reps Heel Slides: AROM;Supine;Both;10 reps Hip ABduction/ADduction: AROM;Supine;Both;10 reps Hip Flexion/Marching: AROM;Seated;Both;10 reps    General Comments        Pertinent Vitals/Pain Pain Assessment: No/denies pain    Home Living                      Prior Function            PT Goals (current goals can now be found in the care plan section) Progress towards PT goals: Progressing toward goals    Frequency  Min 3X/week    PT Plan Current plan remains appropriate    Co-evaluation             End of Session Equipment Utilized During Treatment: Gait belt Activity Tolerance: Patient limited by fatigue Patient left: in chair;with call bell/phone within reach     Time: 1305-1330 PT Time Calculation (min) (ACUTE ONLY): 25 min  Charges:  $Gait Training: 8-22 mins $Therapeutic Activity:  8-22 mins                    G Codes:      Emmit Oriley,KATHrine E February 20, 2014, 3:03 PM Carmelia Bake, PT, DPT February 20, 2014 Pager: [phone]

## 2014-01-31 ENCOUNTER — Telehealth: Payer: Self-pay | Admitting: Hematology and Oncology

## 2014-01-31 LAB — BASIC METABOLIC PANEL
Anion gap: 10 (ref 5–15)
BUN: 12 mg/dL (ref 6–23)
CO2: 29 meq/L (ref 19–32)
Calcium: 8 mg/dL — ABNORMAL LOW (ref 8.4–10.5)
Chloride: 101 mEq/L (ref 96–112)
Creatinine, Ser: 0.61 mg/dL (ref 0.50–1.10)
GFR calc Af Amer: >90 mL/min (ref >90)
GFR calc non Af Amer: 87 mL/min — ABNORMAL LOW (ref >90)
Glucose, Bld: 106 mg/dL — ABNORMAL HIGH (ref 70–99)
Potassium: 3.6 mEq/L — ABNORMAL LOW (ref 3.7–5.3)
Sodium: 140 mEq/L (ref 137–147)

## 2014-01-31 LAB — PROTIME-INR
INR: 1.88 — AB (ref 0.00–1.49)
Prothrombin Time: 21.8 seconds — ABNORMAL HIGH (ref 11.6–15.2)

## 2014-01-31 LAB — GLUCOSE, CAPILLARY
GLUCOSE-CAPILLARY: 118 mg/dL — AB (ref 70–99)
GLUCOSE-CAPILLARY: 177 mg/dL — AB (ref 70–99)
Glucose-Capillary: 96 mg/dL (ref 70–99)
Glucose-Capillary: 108 mg/dL — ABNORMAL HIGH (ref 70–99)

## 2014-01-31 LAB — CBC
HEMATOCRIT: 24.9 % — AB (ref 36.0–46.0)
HEMOGLOBIN: 8 g/dL — AB (ref 12.0–15.0)
MCH: 28.1 pg (ref 26.0–34.0)
MCHC: 32.1 g/dL (ref 30.0–36.0)
MCV: 87.4 fL (ref 78.0–100.0)
Platelets: 143 10*3/uL — ABNORMAL LOW (ref 150–400)
RBC: 2.85 MIL/uL — AB (ref 3.87–5.11)
RDW: 16.4 % — ABNORMAL HIGH (ref 11.5–15.5)
WBC: 12.4 10*3/uL — ABNORMAL HIGH (ref 4.0–10.5)

## 2014-01-31 LAB — MAGNESIUM: MAGNESIUM: 1.2 mg/dL — AB (ref 1.5–2.5)

## 2014-01-31 MED ORDER — MAGNESIUM SULFATE 4 GM/100ML IV SOLN
4.0000 g | Freq: Once | INTRAVENOUS | Status: AC
  Administered 2014-01-31: 4 g via INTRAVENOUS
  Filled 2014-01-31 (×2): qty 100

## 2014-01-31 MED ORDER — WARFARIN SODIUM 3 MG PO TABS
3.0000 mg | ORAL_TABLET | Freq: Once | ORAL | Status: AC
  Administered 2014-01-31: 3 mg via ORAL
  Filled 2014-01-31: qty 1

## 2014-01-31 MED ORDER — POTASSIUM CHLORIDE 10 MEQ/100ML IV SOLN
10.0000 meq | INTRAVENOUS | Status: AC
  Administered 2014-01-31 (×2): 10 meq via INTRAVENOUS
  Filled 2014-01-31 (×2): qty 100

## 2014-01-31 MED ORDER — DIPHENOXYLATE-ATROPINE 2.5-0.025 MG PO TABS
2.0000 | ORAL_TABLET | Freq: Four times a day (QID) | ORAL | Status: DC
  Administered 2014-01-31 – 2014-02-01 (×4): 2 via ORAL
  Filled 2014-01-31 (×4): qty 2

## 2014-01-31 MED ORDER — MAGIC MOUTHWASH W/LIDOCAINE
5.0000 mL | Freq: Four times a day (QID) | ORAL | Status: DC | PRN
  Filled 2014-01-31: qty 5

## 2014-01-31 NOTE — Progress Notes (Signed)
Progress Note   JERNI SELMER QQV:[id number] DOB: [birth year] DOA: 01/18/2014 PCP: Horatio Pel, MD   Brief Narrative:   SAHILY [name] is an 73 y.o. 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 Empiric antibiotics including vancomycin and cefepime were started for treatment of severe sepsis thought to be secondary to HCAP. Blood cultures grew Escherichia coli. Urine culture grew Escherichia coli and Klebsiella. Her hospital course was complicated with worsening respiratory failure and requirement for endotracheal intubation on 01/22/2014.  Patient was subsequently extubated 01/25/2014. [facility] assumed primary care starting 01/28/2014.  Assessment/Plan:   Principal Problem:   Severe sepsis with septic shock / Escherichia coli bacteremia  Initially treated with empiric vancomycin/cefepime.  Blood cultures positive for Escherichia coli, urine cultures positive for Escherichia coli and Klebsiella.  Follow-up cultures negative to date.  Currently being managed with IV Rocephin, which will be finished 02/01/14.  Severe sepsis/septic shock resolved.  Mobilize with PT evaluation for discharge planning.  Active Problems:   Atrial fibrillation / Long-term (current) use of anticoagulants  Continue digoxin and Coumadin per pharmacy.    Hyperlipemia  Continue zetia and Pravachol.    Hypothyroidism  Continue Synthroid.    Diabetes mellitus type 2, controlled, without complications CBGs 332-951 on Lantus 10 units daily.    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 Alvy Bimler.  Status post 1 cycle of chemotherapy with bendamustine and rituximab on 01/09/2014.   Anemia in neoplastic disease  Hemoglobin stable with no current indication for transfusion.    HCAP (healthcare-associated pneumonia)  Status post treatment with broad-spectrum antibiotics.  Source of sepsis likely from UTI given Escherichia coli bacteremia.    Thrombocytopenia  Secondary to chemotherapy, resolved.    Diarrhea  C. difficile negative.  Likely secondary to chemotherapy induced enteritis.  Will start QID Lomotil.    Acute encephalopathy  Likely from sepsis.    Hypokalemia/hypomagnesemia  Continue to monitor and replace electrolytes when needed.    Metabolic acidosis  Secondary to sepsis, resolved.    Acute renal failure  Secondary to sepsis, resolved.    Mucositis due to chemotherapy  Continue Magic mouthwash.      Acute respiratory failure with hypoxia / sleep apnea  Intubated 01/22/14-01/25/14.  Continue Brovana, Pulmicort, and albuterol as needed.  Continue supplemental oxygen to keep saturations greater than 90%.    DVT Prophylaxis  Anticoagulated with Coumadin.  Code Status: Full. Family Communication: Husband at bedside 01/30/14. Disposition Plan: SNF when stable.   IV Access:    Port-A-Cath   Procedures and diagnostic studies:   Dg Chest Port 1 View 01/18/2014: 1. Left lower lobe airspace opacity. I do note that the patient had a prior left pleural effusion and some of this could be from layering effusion and atelectasis, but left lower lobe pneumonia is certainly not excluded. 2. Borderline cardiomegaly. 3. Atherosclerotic aortic arch. 4. Thoracic spondylosis.   Dg Chest Port 1 View 01/20/2014: LEFT pleural effusion with atelectasis versus consolidation in LEFT lower lobe. Enlargement of cardiac silhouette with pulmonary vascular congestion, cannot exclude minimal pulmonary edema.   Dg Chest Port 1 View 01/21/2014: Layering pleural effusions and basilar atelectasis. There may be superimposed pneumonia, especially at the left  base.  Dg Chest Port 1 View 01/21/2014: 1. Persistent bilateral pleural effusions and edema. 2. New right midlung atelectasis. 3. Little change in diminished aeration to both lung bases.   Dg Chest Port 1 View 01/26/2014 Stable bilateral lower lobe opacities   Dg Chest Port 1 View 01/25/2014 Bilateral pleural effusions left greater than right. Tubes and lines as described.    Medical Consultants:    Dr. Heath Lark, Oncology.  Anti-Infectives:    Vanco 11/21 >> 11/23  Cefepime 11/21 >> 11/23  Ceftriaxone 11/23 >> plan 14 days abx total (day 11 on 12/1)  Subjective:   [name] reports ongoing issues with diarrhea.  Moved her bowels several times last night, incontinent.  Still has nausea but no vomiting.  Had some back pain last night, but reports that this is not "unusual" for her.    Objective:    Filed Vitals:   01/30/14 1923 01/30/14 2111 01/30/14 2206 01/31/14 0455  BP:  142/55  163/57  Pulse:  64 71 74  Temp:  98.9 F (37.2 C)  98.2 F (36.8 C)  TempSrc:  Oral  Oral  Resp:  20 18 16   Height:      Weight:      SpO2: 94% 94% 94% 96%    Intake/Output Summary (Last 24 hours) at 01/31/14 5053 Last data filed at 01/31/14 9767  Gross per 24 hour  Intake 2413.23 ml  Output   2275 ml  Net 138.23 ml    Exam: Gen:  NAD Cardiovascular:  Occasional skipped beat, No M/R/G Respiratory:  Lungs CTAB Gastrointestinal:  Abdomen soft, NT/ND, + BS Extremities:  No C/E/C.   Data Reviewed:    Labs: Basic Metabolic Panel:  Recent Labs Lab 01/25/14 0430 01/26/14 0445 01/27/14 0400 01/29/14 1145 01/30/14 0459 01/31/14 0600  NA 143 148* 150* 147 142 140  K 3.4* 2.8* 3.0* 3.1* 3.1* 3.6*  CL 102 103 104 103 100 101  CO2 29 31 40* 37* 35* 29  GLUCOSE 129* 117* 141* 147* 111* 106*  BUN 61* 52* 33* 19 16 12   CREATININE 1.45* 1.24* 0.97 0.72 0.67 0.61  CALCIUM 8.5 8.6 8.7 8.5 8.3* 8.0*  MG 2.2 1.8 1.9 1.5 1.4* 1.2*  PHOS 3.5 3.7 2.4  --   --    --    GFR Estimated Creatinine Clearance: 56.9 mL/min (by C-G formula based on Cr of 0.61). Liver Function Tests:  Recent Labs Lab 01/27/14 0400  AST 15  ALT 15  ALKPHOS 67  BILITOT 0.4  PROT 5.2*  ALBUMIN 2.5*    Recent Labs Lab 01/26/14 1650  LIPASE 8*   Coagulation profile  Recent Labs Lab 01/27/14 0400 01/28/14 0613 01/29/14 0501 01/30/14 0459 01/31/14 0600  INR 3.82* 3.87* 3.13* 2.28* 1.88*    CBC:  Recent Labs Lab 01/25/14 0430 01/26/14 0445 01/27/14 0400 01/30/14 0955 01/31/14 0600  WBC 16.6* 17.3* 14.6* 15.1* 12.4*  NEUTROABS 15.3* 14.9*  --   --   --   HGB 8.5* 8.7* 8.4* 8.6* 8.0*  HCT 27.1* 27.8* 27.6* 27.2* 24.9*  MCV 85.8 87.7 89.9 88.6 87.4  PLT 116* 192 197 179 143*   Cardiac Enzymes:  Recent Labs Lab 01/26/14 1650 01/26/14 2300 01/27/14 0801 01/27/14 1700  TROPONINI <0.30 <0.30 <0.30 <0.30   BNP (last 3 results)  Recent Labs  01/20/14 1035  PROBNP 36152.0*   CBG:  Recent Labs Lab 01/29/14 2223 01/30/14 0803 01/30/14 1203 01/30/14 1727 01/30/14 2108  GLUCAP 127* 117* 131*  106* 151*   Sepsis Labs:  Recent Labs Lab 01/26/14 0445 01/26/14 1650 01/27/14 0400 01/30/14 0955 01/31/14 0600  WBC 17.3*  --  14.6* 15.1* 12.4*  LATICACIDVEN  --  1.3  --   --   --    Microbiology Recent Results (from the past 240 hour(s))  MRSA PCR Screening     Status: None   Collection Time: 01/21/14  2:40 PM  Result Value Ref Range Status   MRSA by PCR NEGATIVE NEGATIVE Final    Comment:        The GeneXpert MRSA Assay (FDA approved for NASAL specimens only), is one component of a comprehensive MRSA colonization surveillance program. It is not intended to diagnose MRSA infection nor to guide or monitor treatment for MRSA infections.      Medications:   . arformoterol  15 mcg Nebulization BID  . budesonide (PULMICORT) nebulizer solution  0.25 mg Nebulization BID  . cefTRIAXone (ROCEPHIN)  IV  1 g Intravenous Q24H  .  cholecalciferol  2,000 Units Oral Q lunch  . digoxin  125 mcg Oral Daily  . ezetimibe  10 mg Oral Daily  . insulin aspart  0-5 Units Subcutaneous QHS  . insulin glargine  10 Units Subcutaneous Daily  . levothyroxine  250 mcg Oral QAC breakfast  . magic mouthwash w/lidocaine  5 mL Oral QID  . magnesium oxide  400 mg Oral QHS  . montelukast  10 mg Oral Daily  . pantoprazole  40 mg Oral Daily  . pravastatin  20 mg Oral Once per day on Mon Tue Wed Thu  . sodium chloride  10 mL Intravenous Q12H  . sodium chloride  3 mL Intravenous Q12H  . Warfarin - Pharmacist Dosing Inpatient   Does not apply q1800   Continuous Infusions: . 0.9 % NaCl with KCl 40 mEq / L 100 mL/hr at 01/30/14 2111    Time spent: 25 minutes.   LOS: 13 days   [facility] Pager [phone]. If unable to reach me by pager, please call my cell phone at [phone].  *Please refer to [email or web address], password [email or web address] to get updated schedule on who will round on this patient, as hospitalists switch teams weekly. If 7PM-7AM, please contact night-coverage at [email or web address], password [email or web address] for any overnight needs.  01/31/2014, 7:26 AM

## 2014-01-31 NOTE — Progress Notes (Signed)
Occupational Therapy Treatment Patient Details Name: Patty [name] MRN: [id number] DOB: 1940-09-24 Today's Date: 01/31/2014    History of present illness 72 y.o. 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) on Chemo Rx with first Chemotherapy Rx on 11/11/ 2015 who presented to the ED 01/18/14 with complaints of Fevers and Chills and Confusion PMHx: HTN, DM   OT comments  Much improved since eval  Follow Up Recommendations    post acute OT  Equipment Recommendations  None recommended by OT    Recommendations for Other Services      Precautions / Restrictions Precautions Precautions: Fall       Mobility Bed Mobility               General bed mobility comments: pt in chair  Transfers Overall transfer level: Needs assistance Equipment used: Rolling walker (2 wheeled) Transfers: Sit to/from Stand Sit to Stand: Min assist         General transfer comment: verbal cues for hand placement, no posterior lean upon standing today    Balance Overall balance assessment: Needs assistance Sitting-balance support: Bilateral upper extremity supported Sitting balance-Leahy Scale: Good     Standing balance support: Bilateral upper extremity supported Standing balance-Leahy Scale: Fair                     ADL                               Toileting- Water quality scientist and Hygiene: Sit to/from stand;Moderate assistance;Minimal assistance Toileting - Clothing Manipulation Details (indicate cue type and reason): mod a progressing to min A       General ADL Comments: Pt able to perform 5 sit to stand transitions with increased time for resting beween transitions. VC for hand placment. Pt pleased with her progress- but knows she has more therapy ahead of her                Cognition   Behavior During Therapy: WFL for tasks assessed/performed Overall Cognitive Status: Within Functional Limits for tasks  assessed                               General Comments Pt motivated to get well    Pertinent Vitals/ Pain       Pain Assessment: No/denies pain     Prior Functioning/Environment              Frequency Min 2X/week     Progress Toward Goals  OT Goals(current goals can now be found in the care plan section)  Progress towards OT goals: Progressing toward goals  Acute Rehab OT Goals Patient Stated Goal: to get stronger  Plan Discharge plan remains appropriate       End of Session Equipment Utilized During Treatment: Gait belt   Activity Tolerance Patient tolerated treatment well   Patient Left in chair;with family/visitor present;with call bell/phone within reach   Nurse Communication Mobility status        Time: 2119-4174 OT Time Calculation (min): 25 min  Charges: OT General Charges $OT Visit: 1 Procedure OT Treatments $Self Care/Home Management : 23-37 mins  Gerad Cornelio, Thereasa Parkin 01/31/2014, 12:36 PM

## 2014-01-31 NOTE — Progress Notes (Signed)
Came by to follow up on patient from [facility]. She told me about how sick she'd been. Assessed her for spiritual and emotional needs. She has good support from family and church, but appreciated my visit. Helped her connect to her faith for meaning making and hope. Patient requested prayer; prayed. Her spouse Mikki Santee arrived towards the end of the visit. Also provided education about support center and particularly Pam[facility] Hurry Neff's services because patient said she is having trouble eating. "Nothing tastes good."   Epifania Gore, PhD, [place]

## 2014-01-31 NOTE — Progress Notes (Signed)
Physical Therapy Treatment Patient Details Name: SYLVIA HELMS MRN: [id number] DOB: 09-03-1940 Today's Date: 02-02-14    History of Present Illness 73 y.o. 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  Pt reports being pleased with progress and very motivated to continue progressing.  Follow Up Recommendations  SNF     Equipment Recommendations  Rolling walker with 5" wheels    Recommendations for Other Services       Precautions / Restrictions Precautions Precautions: Fall    Mobility  Bed Mobility               General bed mobility comments: pt in chair  Transfers Overall transfer level: Needs assistance Equipment used: Rolling walker (2 wheeled) Transfers: Sit to/from Stand Sit to Stand: Min assist         General transfer comment: verbal cues for hand placement, no posterior lean upon standing today  Ambulation/Gait Ambulation/Gait assistance: Min assist Ambulation Distance (Feet): 140 Feet Assistive device: Rolling walker (2 wheeled) Gait Pattern/deviations: Step-through pattern;Narrow base of support;Trunk flexed;Decreased stride length     General Gait Details: pt able to tolerate increased distance today and only required one seated rest break   Stairs            Wheelchair Mobility    Modified Rankin (Stroke Patients Only)       Balance Overall balance assessment: Needs assistance Sitting-balance support: Bilateral upper extremity supported Sitting balance-Leahy Scale: Good     Standing balance support: Bilateral upper extremity supported Standing balance-Leahy Scale: Fair                      Cognition Arousal/Alertness: Awake/alert Behavior During Therapy:  WFL for tasks assessed/performed Overall Cognitive Status: Within Functional Limits for tasks assessed                      Exercises      General Comments        Pertinent Vitals/Pain Pain Assessment: No/denies pain    Home Living                      Prior Function            PT Goals (current goals can now be found in the care plan section) Acute Rehab PT Goals Patient Stated Goal: to get stronger Progress towards PT goals: Progressing toward goals    Frequency  Min 3X/week    PT Plan Current plan remains appropriate    Co-evaluation             End of Session Equipment Utilized During Treatment: Gait belt Activity Tolerance: Patient limited by fatigue Patient left: in chair;with call bell/phone within reach (with MD)     Time: 7829-5621 PT Time Calculation (min) (ACUTE ONLY): 16 min  Charges:  $Gait Training: 8-22 mins                    G Codes:      Shilo Pauwels,KATHrine E 02-02-2014, 12:36 PM Carmelia Bake, PT, DPT 02-02-2014 Pager: [phone]

## 2014-01-31 NOTE — Progress Notes (Signed)
CSW continuing to follow for disposition planning.   CSW met with pt and pt spouse at bedside. CSW discussed that Per MD, pt likely medically ready for discharge tomorrow.    CSW provided support as pt discussed that she has made the decision to definitely go to SNF for short term rehab as this is what PT and MD are recommending.   CSW notified pt that [facility] has a private room available on Friday for pt. Pt pleased to know that she will have a private room at SNF.   CSW discussed with pt husband about completing admission paperwork today at 2 pm to prepare for potential discharge tomorrow.   CSW to continue to follow to provide support and assist with pt discharge needs to [facility] when pt medically ready for discharge.  Alison Murray, MSW, [place] [employer] [phone]

## 2014-01-31 NOTE — Progress Notes (Signed)
ANTICOAGULATION CONSULT NOTE - Follow Up  Pharmacy Consult for Warfarin Indication: atrial fibrillation  Allergies  Allergen Reactions  . Morphine And Related Anaphylaxis and Other (See Comments)    Pt states she stopped breathing post op- "went into resp. arrest"  . Demerol Nausea And Vomiting  . Meperidine Hcl Nausea And Vomiting  . Multaq [Dronedarone] Other (See Comments)    Reaction:  Blood in urine and elevated liver enzymes.   Jeanie Cooks Allergy] Rash  . Penicillins Itching and Rash  . Sulfa Drugs Cross Reactors Rash   Patient Measurements: Height: 5\' 6"  (167.6 cm) Weight: 147 lb 4.3 oz (66.8 kg) IBW/kg (Calculated) : 59.3  Vital Signs: Temp: 98.2 F (36.8 C) (12/03 0455) Temp Source: Oral (12/03 0455) BP: 163/57 mmHg (12/03 0455) Pulse Rate: 74 (12/03 0455)  Labs:  Recent Labs  01/29/14 0501 01/29/14 1145 01/30/14 0459 01/30/14 0955 01/31/14 0600  HGB  --   --   --  8.6* 8.0*  HCT  --   --   --  27.2* 24.9*  PLT  --   --   --  179 143*  LABPROT 32.4*  --  25.4*  --  21.8*  INR 3.13*  --  2.28*  --  1.88*  CREATININE  --  0.72 0.67  --  0.61   Estimated Creatinine Clearance: 56.9 mL/min (by C-G formula based on Cr of 0.61).  Medical History: Past Medical History  Diagnosis Date  . Asthma 06/15/2010  . Hypothyroidism 06/15/2010  . Hyperlipemia 06/15/2010  . Atrial fibrillation 05/28/2010    Managed with rate control and coumadin  . Long-term (current) use of anticoagulants   . ACE-inhibitor cough   . DI (detrusor instability)   . Fibroid   . Rectocele   . Atrophic vaginitis   . 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 Diabetes mellitus     Type 2  . Peripheral neuropathy   . Hypertension   . Dysrhythmia     cardioversion - 2012  . Complication of anesthesia 1980's    post anesth.- states she "went into resp. arrest" , but then remarked that she thought maybe they gave her too much medicine (morphine)   . Shortness of breath    sometimes   . 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 Anemia   . Malignant lymphoma, follicular 30/0/92  . CPAP (continuous positive airway pressure) dependence   . GERD (gastroesophageal reflux disease)   . Hearing loss   . Sleep apnea     uses CPAP everynight- last study in [facility] 5 yrs. or more   . Severe sepsis with septic shock 01/21/2014  . E coli bacteremia 01/20/2014    Inpatient warfarin doses administered from 11/20: 5mg  at home prior to admission on 11/20 2.5mg  (11/21), 5mg  (11/22), 0mg  (11/23-11/26), 2.5mg  (11/27-28), 0mg  (11/29-12/1), 3mg  (12/2)   Assessment: 6 yoF admitted with sepsis secondary to HCAP following first course of chemotherapy for NHL. On chronic Warfarin for Afib: home dose 2.5mg  on Wed, Sat with 5mg  other days.  Admit INR 2.20, in therapeutic range.    Goal of Therapy:  INR 2-3  Today, 01/31/2014:  INR 1.88, now below therapeutic range after holding warfarin several days as above.  Patient's INR has been much more sensitive to warfarin this admission.  Contributing factors likely low oral intake, diarrhea, and s/p septic shock.  Pltc 143k  Hgb low, fairly stable  No bleeding reported  No current significant  drug interactions with warfarin, noted patient on digoxin  Diet: cardiac diet started 11/28, still poor intake, previously NPO  Plan: 1. Repeat warfarin 3mg  PO x1 tonight 2. Follow daily PT/INR 3. Follow clinical course and any reports of bleeding.  Thank you for the consult.  Currie Paris, PharmD, BCPS Pager: [phone] Pharmacy: [phone] 01/31/2014 10:43 AM

## 2014-01-31 NOTE — Telephone Encounter (Signed)
s.w. pt husband and advised on appt....ok and aware of appt

## 2014-01-31 NOTE — Progress Notes (Signed)
RT placed patient on CPAP. Patient setting is auto titrate (min 5 and max 15). Sterile water added to water chamber for humidification. Patient is tolerating well. RT to monitor as needed.

## 2014-02-01 DIAGNOSIS — D682 Hereditary deficiency of other clotting factors: Secondary | ICD-10-CM | POA: Diagnosis not present

## 2014-02-01 DIAGNOSIS — R6520 Severe sepsis without septic shock: Secondary | ICD-10-CM | POA: Diagnosis not present

## 2014-02-01 DIAGNOSIS — I4891 Unspecified atrial fibrillation: Secondary | ICD-10-CM | POA: Diagnosis not present

## 2014-02-01 DIAGNOSIS — A419 Sepsis, unspecified organism: Secondary | ICD-10-CM | POA: Diagnosis not present

## 2014-02-01 DIAGNOSIS — E46 Unspecified protein-calorie malnutrition: Secondary | ICD-10-CM | POA: Diagnosis not present

## 2014-02-01 DIAGNOSIS — R110 Nausea: Secondary | ICD-10-CM | POA: Diagnosis not present

## 2014-02-01 DIAGNOSIS — C8290 Follicular lymphoma, unspecified, unspecified site: Secondary | ICD-10-CM | POA: Diagnosis not present

## 2014-02-01 DIAGNOSIS — G4730 Sleep apnea, unspecified: Secondary | ICD-10-CM | POA: Diagnosis not present

## 2014-02-01 DIAGNOSIS — R1310 Dysphagia, unspecified: Secondary | ICD-10-CM | POA: Diagnosis not present

## 2014-02-01 DIAGNOSIS — R279 Unspecified lack of coordination: Secondary | ICD-10-CM | POA: Diagnosis not present

## 2014-02-01 DIAGNOSIS — E785 Hyperlipidemia, unspecified: Secondary | ICD-10-CM | POA: Diagnosis not present

## 2014-02-01 DIAGNOSIS — C8591 Non-Hodgkin lymphoma, unspecified, lymph nodes of head, face, and neck: Secondary | ICD-10-CM | POA: Diagnosis not present

## 2014-02-01 DIAGNOSIS — D649 Anemia, unspecified: Secondary | ICD-10-CM | POA: Diagnosis not present

## 2014-02-01 DIAGNOSIS — E039 Hypothyroidism, unspecified: Secondary | ICD-10-CM | POA: Diagnosis not present

## 2014-02-01 DIAGNOSIS — K5289 Other specified noninfective gastroenteritis and colitis: Secondary | ICD-10-CM | POA: Diagnosis not present

## 2014-02-01 DIAGNOSIS — J9691 Respiratory failure, unspecified with hypoxia: Secondary | ICD-10-CM | POA: Diagnosis not present

## 2014-02-01 DIAGNOSIS — A4151 Sepsis due to Escherichia coli [E. coli]: Secondary | ICD-10-CM | POA: Diagnosis not present

## 2014-02-01 DIAGNOSIS — M6281 Muscle weakness (generalized): Secondary | ICD-10-CM | POA: Diagnosis not present

## 2014-02-01 DIAGNOSIS — I482 Chronic atrial fibrillation: Secondary | ICD-10-CM | POA: Diagnosis not present

## 2014-02-01 DIAGNOSIS — R7881 Bacteremia: Secondary | ICD-10-CM | POA: Diagnosis not present

## 2014-02-01 DIAGNOSIS — I480 Paroxysmal atrial fibrillation: Secondary | ICD-10-CM | POA: Diagnosis not present

## 2014-02-01 DIAGNOSIS — E119 Type 2 diabetes mellitus without complications: Secondary | ICD-10-CM | POA: Diagnosis not present

## 2014-02-01 DIAGNOSIS — J9601 Acute respiratory failure with hypoxia: Secondary | ICD-10-CM | POA: Diagnosis not present

## 2014-02-01 DIAGNOSIS — Z7901 Long term (current) use of anticoagulants: Secondary | ICD-10-CM | POA: Diagnosis not present

## 2014-02-01 DIAGNOSIS — G9340 Encephalopathy, unspecified: Secondary | ICD-10-CM | POA: Diagnosis not present

## 2014-02-01 DIAGNOSIS — N179 Acute kidney failure, unspecified: Secondary | ICD-10-CM | POA: Diagnosis not present

## 2014-02-01 DIAGNOSIS — C8291 Follicular lymphoma, unspecified, lymph nodes of head, face, and neck: Secondary | ICD-10-CM | POA: Diagnosis not present

## 2014-02-01 DIAGNOSIS — D630 Anemia in neoplastic disease: Secondary | ICD-10-CM | POA: Diagnosis not present

## 2014-02-01 DIAGNOSIS — J189 Pneumonia, unspecified organism: Secondary | ICD-10-CM | POA: Diagnosis not present

## 2014-02-01 LAB — GLUCOSE, CAPILLARY
Glucose-Capillary: 88 mg/dL (ref 70–99)
Glucose-Capillary: 137 mg/dL — ABNORMAL HIGH (ref 70–99)

## 2014-02-01 LAB — PROTIME-INR
INR: 2.09 — AB (ref 0.00–1.49)
Prothrombin Time: 23.6 seconds — ABNORMAL HIGH (ref 11.6–15.2)

## 2014-02-01 MED ORDER — TRAMADOL-ACETAMINOPHEN 37.5-325 MG PO TABS: 1.0000 | ORAL_TABLET | Freq: Four times a day (QID) | ORAL | Status: DC | PRN

## 2014-02-01 MED ORDER — DIPHENOXYLATE-ATROPINE 2.5-0.025 MG PO TABS: 2.0000 | ORAL_TABLET | Freq: Four times a day (QID) | ORAL | Status: DC | PRN

## 2014-02-01 MED ORDER — WARFARIN SODIUM 3 MG PO TABS
3.0000 mg | ORAL_TABLET | Freq: Once | ORAL | Status: DC
  Filled 2014-02-01: qty 1

## 2014-02-01 MED ORDER — HEPARIN SOD (PORK) LOCK FLUSH 100 UNIT/ML IV SOLN
INTRAVENOUS | Status: AC
Start: 2014-02-01 — End: 2014-02-02
  Filled 2014-02-01: qty 5

## 2014-02-01 MED ORDER — MAGIC MOUTHWASH W/LIDOCAINE: 5.0000 mL | Freq: Four times a day (QID) | ORAL | Status: DC | PRN

## 2014-02-01 MED ORDER — HEPARIN SOD (PORK) LOCK FLUSH 100 UNIT/ML IV SOLN: 500.0000 [IU] | INTRAVENOUS | Status: DC | PRN

## 2014-02-01 NOTE — Progress Notes (Signed)
Report called to Joe at [facility] in preparation for patient to be transported there. Patient stable from AM assessment. [facility] called for transport.

## 2014-02-01 NOTE — Care Management Note (Signed)
02/01/14 Marney Doctor RN,BSN,NCM [phone]  Pt to DC to [facility] today.  No CM needs noted.  IM letter given to pt.

## 2014-02-01 NOTE — Discharge Instructions (Signed)
Bacteremia °Bacteremia occurs when bacteria get in your blood. Normal blood does not usually have bacteria. Bacteremia is one way infections can spread from one part of the body to another. °CAUSES  °· Causes may include anything that allows bacteria to get into the body. Examples are: °¨ Catheters. °¨ Intravenous (IV) access tubes. °¨ Cuts or scrapes of the skin. °· Temporary bacteremia may occur during dental procedures, while brushing your teeth, or during a bowel movement. This rarely causes any symptoms or medical problems. °· Bacteria may also get in the bloodstream as a complication of a bacterial infection elsewhere. This includes infected wounds and bacterial infections of the: °¨ Lungs (pneumonia). °¨ Kidneys (pyelonephritis). °¨ Intestines (enteritis, colitis). °¨ Organs in the abdomen (appendicitis, cholecystitis, diverticulitis). °SYMPTOMS  °The body is usually able to clear small numbers of bacteria out of the blood quickly. Brief bacteremia usually does not cause problems.  °· Problems can occur if the bacteria start to grow in number or spread to other parts of the body. If the bacteria start growing, you may develop: °¨ Chills. °¨ Fever. °¨ Nausea. °¨ Vomiting. °¨ Sweating. °¨ Lightheadedness and low blood pressure. °¨ Pain. °· If bacteria start to grow in the linings around the brain, it is called meningitis. This can cause severe headaches, many other problems, and even death. °· If bacteria start to grow in a joint, it causes arthritis with painful joints. If bacteria start to grow in a bone, it is called osteomyelitis. °· Bacteria from the blood can also cause sores (abscesses) in many organs, such as the muscle, liver, spleen, lungs, brain, and kidneys. °DIAGNOSIS  °· This condition is diagnosed by cultures of the blood. °· Cultures may also be taken from other parts of the body that are thought to be causing the bacteremia. A small piece of tissue, fluid, or other product of the body is  sampled. The sample is then put on a growth plate to see if any bacteria grows. °· Other lab tests may be done and the results may be abnormal. °TREATMENT  °Treatment requires a stay in the hospital. You will be given antibiotic medicine through an IV access tube. °PREVENTION  °People with an increased risk of developing bacteremia or complications may be given antibiotics before certain procedures. Examples are: °· A person with a heart murmur or artificial heart valve, before having his or her teeth cleaned. °· Before having a surgical or other invasive procedure. °· Before having a bowel procedure. °Document Released: 11/29/2005 Document Revised: 05/10/2011 Document Reviewed: 09/10/2010 °ExitCare® Patient Information ©2015 [employer]. This information is not intended to replace advice given to you by your health care provider. Make sure you discuss any questions you have with your health care provider. ° °

## 2014-02-01 NOTE — Progress Notes (Signed)
   02/01/14 1300  PT Visit Information  Last PT Received On 02/01/14  Assistance Needed +1  Reason Eval/Treat Not Completed Other (comment)  Pt seen for PT tx today by Kenyon Ana, PT; See paper/ghost chart for details;

## 2014-02-01 NOTE — Progress Notes (Signed)
ANTICOAGULATION CONSULT NOTE - Follow Up  Pharmacy Consult for Warfarin Indication: atrial fibrillation  Allergies  Allergen Reactions  . Morphine And Related Anaphylaxis and Other (See Comments)    Pt states she stopped breathing post op- "went into resp. arrest"  . Demerol Nausea And Vomiting  . Meperidine Hcl Nausea And Vomiting  . Multaq [Dronedarone] Other (See Comments)    Reaction:  Blood in urine and elevated liver enzymes.   Jeanie Cooks Allergy] Rash  . Penicillins Itching and Rash  . Sulfa Drugs Cross Reactors Rash   Patient Measurements: Height: 5\' 6"  (167.6 cm) Weight: 147 lb 4.3 oz (66.8 kg) IBW/kg (Calculated) : 59.3  Vital Signs: Temp: 98.4 F (36.9 C) (12/04 0506) Temp Source: Oral (12/04 0506) BP: 140/55 mmHg (12/04 0506) Pulse Rate: 60 (12/04 0506)  Labs:  Recent Labs  01/29/14 1145 01/30/14 0459 01/30/14 0955 01/31/14 0600 02/01/14 0530  HGB  --   --  8.6* 8.0*  --   HCT  --   --  27.2* 24.9*  --   PLT  --   --  179 143*  --   LABPROT  --  25.4*  --  21.8* 23.6*  INR  --  2.28*  --  1.88* 2.09*  CREATININE 0.72 0.67  --  0.61  --    Estimated Creatinine Clearance: 56.9 mL/min (by C-G formula based on Cr of 0.61).  Medical History: Past Medical History  Diagnosis Date  . Asthma 06/15/2010  . Hypothyroidism 06/15/2010  . Hyperlipemia 06/15/2010  . Atrial fibrillation 05/28/2010    Managed with rate control and coumadin  . Long-term (current) use of anticoagulants   . ACE-inhibitor cough   . DI (detrusor instability)   . Fibroid   . Rectocele   . Atrophic vaginitis   . 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 Diabetes mellitus     Type 2  . Peripheral neuropathy   . Hypertension   . Dysrhythmia     cardioversion - 2012  . Complication of anesthesia 1980's    post anesth.- states she "went into resp. arrest" , but then remarked that she thought maybe they gave her too much medicine (morphine)   . Shortness of breath    sometimes   . 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 Anemia   . 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 CPAP (continuous positive airway pressure) dependence   . GERD (gastroesophageal reflux disease)   . Hearing loss   . Sleep apnea     uses CPAP everynight- last study in [place] 5 yrs. or more   . Severe sepsis with septic shock 01/21/2014  . E coli bacteremia 01/20/2014    Inpatient warfarin doses administered from 11/20: 5mg  at home prior to admission on 11/20 2.5mg  (11/21), 5mg  (11/22), 0mg  (11/23-11/26), 2.5mg  (11/27-28), 0mg  (11/29-12/1), 3mg  (12/2-3)   Assessment: 75 yoF admitted with sepsis secondary to HCAP following first course of chemotherapy for NHL. On chronic Warfarin for Afib: home dose 2.5mg  on Wed, Sat with 5mg  other days.  Admit INR 2.20, in therapeutic range.    Goal of Therapy:  INR 2-3  Today, 02/01/2014:  INR 2.09, now back within therapeutic range after holding warfarin several days as above.  Patient's INR has been much more sensitive to warfarin this admission.  Contributing factors likely low oral intake, diarrhea, and s/p septic shock.  Last CBC 12/3  Pltc 143k  Hgb low, fairly stable  No bleeding  reported  No current significant drug interactions with warfarin, noted patient on digoxin  Diet: Regular diet started 12/1, still poor intake   Noted patient to discharge soon.   Pt will need close monitoring of her INR upon discharge due to continued low PO intake.  Pt with low warfarin needs during admission, patient will likely NOT be able to return to previous dosing schedule immediately.   Plan: 1. Repeat warfarin 3mg  PO x1 tonight 2. Follow daily PT/INR 3. Follow clinical course and any reports of bleeding. 4. CBC and Bmet in the AM if still admitted  Thank you for the consult.  Currie Paris, PharmD, BCPS Pager: [phone] Pharmacy: [phone] 02/01/2014 9:26 AM

## 2014-02-01 NOTE — Discharge Summary (Signed)
Physician Discharge Summary  Patty [name] HKV:[id number] DOB: 1940/06/29 DOA: 01/18/2014  PCP: Horatio Pel, MD  Admit date: 01/18/2014 Discharge date: 02/01/2014   Recommendations for Outpatient Follow-Up:    Recommend outpatient F/U with PCP in 2 weeks.   Recommend daily PT/INR checks until INR stable/therapeutic.   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  Diet recommendation: Low sodium, heart healthy.  Carbohydrate-modified.    History of Present Illness:   Patty [name] is an 73 y.o. 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 Empiric antibiotics including vancomycin and cefepime were started for treatment of severe sepsis thought to be secondary to HCAP. Blood cultures grew Escherichia coli. Urine culture grew Escherichia coli and Klebsiella. Her hospital course was complicated with worsening respiratory failure and requirement for endotracheal intubation on 01/22/2014. Patient was subsequently extubated  01/25/2014. [facility] assumed primary care starting 01/28/2014.  Hospital Course by Problem:   Principal Problem:  Severe sepsis with septic shock / Escherichia coli bacteremia  Initially treated with empiric vancomycin/cefepime with antibiotics narrowed to Rocephin based on culture data.  Completed a 2 week course of antibiotics.  Blood cultures positive for Escherichia coli, urine cultures positive for Escherichia coli and Klebsiella.  Follow-up cultures negative.  Active Problems:  Atrial fibrillation / Long-term (current) use of anticoagulants  Continue digoxin and Coumadin per pharmacy.   Hyperlipemia  Continue zetia and Pravachol.   Hypothyroidism  Continue Synthroid.   Diabetes mellitus type 2, controlled, without complications  Resume Metformin at d/c.   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 Alvy Bimler.  Status post 1 cycle of chemotherapy with bendamustine and rituximab on 01/09/2014.   Anemia in neoplastic disease  Hemoglobin stable with no current indication for transfusion.   HCAP (healthcare-associated pneumonia)  Status post treatment with broad-spectrum antibiotics.  Source of sepsis likely from UTI given Escherichia coli bacteremia.   Thrombocytopenia  Secondary to chemotherapy, resolved.   Diarrhea  C. difficile negative. Likely secondary to chemotherapy induced enteritis.  Continue PRN Lomotil.   Acute encephalopathy  Likely from sepsis. Resolved.   Hypokalemia/hypomagnesemia  Repleted.   Metabolic acidosis  Secondary to sepsis, resolved.   Acute renal failure  Secondary to sepsis, resolved.   Mucositis due to chemotherapy  Continue Magic mouthwash PRN.    Acute respiratory failure with hypoxia / sleep apnea  Intubated 01/22/14-01/25/14.  Treated with Brovana, Pulmicort, and albuterol as needed.  Resume Advair at  D/C.   Medical Consultants:    Dr. Chesley Mires,  PCCM   Discharge Exam:   Filed Vitals:   02/01/14 0506  BP: 140/55  Pulse: 60  Temp: 98.4 F (36.9 C)  Resp: 16   Filed Vitals:   01/31/14 1319 01/31/14 2056 01/31/14 2146 02/01/14 0506  BP: 147/51  149/55 140/55  Pulse: 62  66 60  Temp: 97.8 F (36.6 C)  98 F (36.7 C) 98.4 F (36.9 C)  TempSrc: Oral  Oral Oral  Resp: 18  16 16   Height:      Weight:      SpO2: 96% 95% 96% 96%    Gen:  NAD Cardiovascular:  RRR, No M/R/G Respiratory: Lungs CTAB Gastrointestinal: Abdomen soft, NT/ND with normal active bowel sounds. Extremities: No C/E/C   The results of significant diagnostics from this hospitalization (including imaging, microbiology, ancillary and laboratory) are listed below for reference.     Procedures and Diagnostic Studies:   Dg Chest Port 1 View 01/18/2014: 1. Left lower lobe airspace opacity. I do note that the patient had a prior left pleural effusion and some of this could be from layering effusion and atelectasis, but left lower lobe pneumonia is certainly not excluded. 2. Borderline cardiomegaly. 3. Atherosclerotic aortic arch. 4. Thoracic spondylosis.   Dg Chest Port 1 View 01/20/2014: LEFT pleural effusion with atelectasis versus consolidation in LEFT lower lobe. Enlargement of cardiac silhouette with pulmonary vascular congestion, cannot exclude minimal pulmonary edema.   Dg Chest Port 1 View 01/21/2014: Layering pleural effusions and basilar atelectasis. There may be superimposed pneumonia, especially at the left base.   Dg Chest Port 1 View 01/21/2014: 1. Persistent bilateral pleural effusions and edema. 2. New right midlung atelectasis. 3. Little change in diminished aeration to both lung bases.   Dg Chest Port 1 View 01/22/2014: Tube and catheter positions as described without pneumothorax. Left lower lobe consolidation. Suspect pneumonia. Findings felt to be indicative of a degree of underlying congestive heart failure as well.     Dg Chest  Port 1 View 01/23/2014: New orogastric tube is in good position.  Bibasilar airspace disease with pulmonary edema and pleural effusions.     Dg Chest Port 1 View 01/24/2014: Worsened bilateral pleural effusions and airspace disease.     Dg Chest Port 1 View 01/26/2014 Stable bilateral lower lobe opacities   Dg Chest Port 1 View 01/25/2014 Bilateral pleural effusions left greater than right. Tubes and lines as described.    Labs:   Basic Metabolic Panel:  Recent Labs Lab 01/26/14 0445 01/27/14 0400 01/29/14 1145 01/30/14 0459 01/31/14 0600  NA 148* 150* 147 142 140  K 2.8* 3.0* 3.1* 3.1* 3.6*  CL 103 104 103 100 101  CO2 31 40* 37* 35* 29  GLUCOSE 117* 141* 147* 111* 106*  BUN 52* 33* 19 16 12   CREATININE 1.24* 0.97 0.72 0.67 0.61  CALCIUM 8.6 8.7 8.5 8.3* 8.0*  MG 1.8 1.9 1.5 1.4* 1.2*  PHOS 3.7 2.4  --   --   --    GFR Estimated Creatinine Clearance: 56.9 mL/min (by C-G formula based on Cr of 0.61). Liver Function Tests:  Recent Labs Lab 01/27/14 0400  AST 15  ALT 15  ALKPHOS 67  BILITOT 0.4  PROT 5.2*  ALBUMIN 2.5*    Recent Labs Lab 01/26/14 1650  LIPASE 8*   Coagulation profile  Recent Labs Lab 01/28/14 0613 01/29/14 0501 01/30/14 0459 01/31/14 0600 02/01/14 0530  INR 3.87* 3.13*  2.28* 1.88* 2.09*    CBC:  Recent Labs Lab 01/26/14 0445 01/27/14 0400 01/30/14 0955 01/31/14 0600  WBC 17.3* 14.6* 15.1* 12.4*  NEUTROABS 14.9*  --   --   --   HGB 8.7* 8.4* 8.6* 8.0*  HCT 27.8* 27.6* 27.2* 24.9*  MCV 87.7 89.9 88.6 87.4  PLT 192 197 179 143*   Cardiac Enzymes:  Recent Labs Lab 01/26/14 1650 01/26/14 2300 01/27/14 0801 01/27/14 1700  TROPONINI <0.30 <0.30 <0.30 <0.30   BNP: Invalid input(s): POCBNP CBG:  Recent Labs Lab 01/31/14 0756 01/31/14 1143 01/31/14 1647 01/31/14 2135 02/01/14 0745  GLUCAP 96 108* 118* 177* 88     Discharge Instructions:   Discharge Instructions    Call MD for:  extreme fatigue     Complete by:  As directed      Call MD for:  persistant nausea and vomiting    Complete by:  As directed      Call MD for:  severe uncontrolled pain    Complete by:  As directed      Call MD for:  temperature >100.4    Complete by:  As directed      Diet - low sodium heart healthy    Complete by:  As directed      Diet Carb Modified    Complete by:  As directed      Discharge instructions    Complete by:  As directed   You were cared for by Dr. Jacquelynn Cree  (a hospitalist) during your hospital stay. If you have any questions about your discharge medications or the care you received while you were in the hospital after you are discharged, you can call the unit and ask to speak with the hospitalist on call if the hospitalist that took care of you is not available. Once you are discharged, your primary care physician will handle any further medical issues. Please note that NO REFILLS for any discharge medications will be authorized once you are discharged, as it is imperative that you return to your primary care physician (or establish a relationship with a primary care physician if you do not have one) for your aftercare needs so that they can reassess your need for medications and monitor your lab values.  Any outstanding tests can be reviewed by your PCP at your follow up visit.  It is also important to review any medicine changes with your PCP.  Please bring these d/c instructions with you to your next visit so your physician can review these changes with you.     Increase activity slowly    Complete by:  As directed      Walk with assistance    Complete by:  As directed      Walker     Complete by:  As directed             Medication List    TAKE these medications        acetaminophen 500 MG tablet  Commonly known as:  TYLENOL  Take 1,000 mg by mouth every 6 (six) hours as needed for mild pain.     acyclovir 400 MG tablet  Commonly known as:  ZOVIRAX  Take 1 tablet (400 mg  total) by mouth daily.     allopurinol 300 MG tablet  Commonly known as:  ZYLOPRIM  Take 1 tablet (300 mg total) by mouth daily.     conjugated estrogens vaginal cream  Commonly known as:  PREMARIN  Place 1 Applicatorful vaginally 3 (three) times a week. Monday, Wednesday, and Friday nights     dicyclomine 10 MG capsule  Commonly known as:  BENTYL  Take 10 mg by mouth daily as needed (ibs flare up).     digoxin 0.125 MG tablet  Commonly known as:  LANOXIN  Take 125 mcg by mouth daily.     diltiazem 120 MG 24 hr capsule  Commonly known as:  CARDIZEM CD  Take 120 mg by mouth daily.     diphenoxylate-atropine 2.5-0.025 MG per tablet  Commonly known as:  LOMOTIL  Take 2 tablets by mouth 4 (four) times daily as needed for diarrhea or loose stools.     ezetimibe 10 MG tablet  Commonly known as:  ZETIA  Take 10 mg by mouth daily.     Fluticasone-Salmeterol 250-50 MCG/DOSE Aepb  Commonly known as:  ADVAIR  Inhale 1 puff into the lungs every 12 (twelve) hours.     hydrochlorothiazide 25 MG tablet  Commonly known as:  HYDRODIURIL  Take 25 mg by mouth daily as needed (for ankle swelling).     levothyroxine 125 MCG tablet  Commonly known as:  SYNTHROID, LEVOTHROID  Take 250 mcg by mouth daily before breakfast.     lidocaine-prilocaine cream  Commonly known as:  EMLA  Apply 1 application topically as needed. Apply to [facility] a Cath site at least one hour prior to needle stick as needed.     magic mouthwash w/lidocaine Soln  Take 5 mLs by mouth 4 (four) times daily as needed for mouth pain.     Magnesium Oxide 500 MG Tabs  Take 500 mg by mouth at bedtime.     metFORMIN 500 MG 24 hr tablet  Commonly known as:  GLUCOPHAGE-XR  Take 500 mg by mouth 2 (two) times daily with a meal.     montelukast 10 MG tablet  Commonly known as:  SINGULAIR  Take 10 mg by mouth daily.     omega-3 acid ethyl esters 1 G capsule  Commonly known as:  LOVAZA  Take 1 g by mouth 2 (two) times daily.       ondansetron 8 MG tablet  Commonly known as:  ZOFRAN  Take 1 tablet (8 mg total) by mouth every 8 (eight) hours as needed.     potassium chloride SA 20 MEQ tablet  Commonly known as:  K-DUR,KLOR-CON  Take 1 tablet (20 mEq total) by mouth daily.     pravastatin 40 MG tablet  Commonly known as:  PRAVACHOL  Take 20 mg by mouth See admin instructions. Take 1/2 tablet (20 mg) on Monday, Tuesday, Wednesday, and Thursday     PROAIR HFA 108 (90 BASE) MCG/ACT inhaler  Generic drug:  albuterol  Inhale 2 puffs into the lungs every 6 (six) hours as needed for wheezing or shortness of breath.     prochlorperazine 10 MG tablet  Commonly known as:  COMPAZINE  Take 1 tablet (10 mg total) by mouth every 6 (six) hours as needed (Nausea or vomiting).     traMADol-acetaminophen 37.5-325 MG per tablet  Commonly known as:  ULTRACET  Take 1 tablet by mouth every 6 (six) hours as needed.     triamterene-hydrochlorothiazide 37.5-25 MG per tablet  Commonly known as:  MAXZIDE-25  Take 1 tablet by mouth every morning.     Vitamin D 2000 UNITS tablet  Take 2,000 Units by mouth daily with lunch.     warfarin 5 MG tablet  Commonly known as:  COUMADIN  Take 2.5-5 mg by mouth daily. Take 1/2 tablet (2.5 mg) on Wednesday and Saturday and take 1 tablet (5 mg) all other days           Follow-up Information    Follow up with Horatio Pel, MD. Schedule an appointment as soon as possible for a visit in 2 weeks.   Specialty:  Internal Medicine   Why:  Hospital follow up.   Contact information:   [address] [place] [address] [phone]        Time coordinating discharge: 35 minutes.  Signed:  RAMA,CHRISTINA  Pager [phone] [facility] 02/01/2014, 9:28 AM

## 2014-02-01 NOTE — Progress Notes (Signed)
Pt for discharge to [facility].  CSW facilitated pt discharge needs including contacting facility, faxing pt discharge information via [facility], discussing with pt at bedside, providing RN phone number to call report, and arranging ambulance transport via [facility] to [place].  Pt coping appropriately with transition to [facility]. Pt motivated for rehab in order to return home with spouse.   No further social work needs identified at this time.  CSW signing off.   Alison Murray, MSW, [place] [employer] [phone]

## 2014-02-05 ENCOUNTER — Telehealth: Payer: Self-pay | Admitting: *Deleted

## 2014-02-05 DIAGNOSIS — I4891 Unspecified atrial fibrillation: Secondary | ICD-10-CM | POA: Diagnosis not present

## 2014-02-05 DIAGNOSIS — K5289 Other specified noninfective gastroenteritis and colitis: Secondary | ICD-10-CM | POA: Diagnosis not present

## 2014-02-05 DIAGNOSIS — A419 Sepsis, unspecified organism: Secondary | ICD-10-CM | POA: Diagnosis not present

## 2014-02-05 DIAGNOSIS — C8290 Follicular lymphoma, unspecified, unspecified site: Secondary | ICD-10-CM | POA: Diagnosis not present

## 2014-02-05 NOTE — Telephone Encounter (Signed)
Received call from [facility] needing clarifications.  Ubaldo Glassing, RN called [facility] back.  Per Ubaldo Glassing, pt has had loose stools frequently since yesterday.  Dallie Piles, NP at the nursing home thinks it is related to Acyclovir that pt is on.  Pt has been given Imodium - max 4 tabs daily per nursing home. Beth requested last office notes to be faxed to her at [facility], NP also stated that any new orders from Dr. Alvy Bimler will need to be faxed to the nursing home. Beth's  Fax    [phone]   ;   Phone     [phone].

## 2014-02-06 ENCOUNTER — Ambulatory Visit: Payer: Medicare Other

## 2014-02-06 ENCOUNTER — Other Ambulatory Visit: Payer: Medicare Other

## 2014-02-06 ENCOUNTER — Telehealth: Payer: Self-pay | Admitting: *Deleted

## 2014-02-06 ENCOUNTER — Telehealth: Payer: Self-pay | Admitting: Hematology and Oncology

## 2014-02-06 ENCOUNTER — Ambulatory Visit: Payer: Medicare Other | Admitting: Hematology and Oncology

## 2014-02-06 DIAGNOSIS — J189 Pneumonia, unspecified organism: Secondary | ICD-10-CM | POA: Diagnosis not present

## 2014-02-06 DIAGNOSIS — I480 Paroxysmal atrial fibrillation: Secondary | ICD-10-CM | POA: Diagnosis not present

## 2014-02-06 DIAGNOSIS — E039 Hypothyroidism, unspecified: Secondary | ICD-10-CM | POA: Diagnosis not present

## 2014-02-06 DIAGNOSIS — Z7901 Long term (current) use of anticoagulants: Secondary | ICD-10-CM | POA: Diagnosis not present

## 2014-02-06 DIAGNOSIS — E119 Type 2 diabetes mellitus without complications: Secondary | ICD-10-CM | POA: Diagnosis not present

## 2014-02-06 DIAGNOSIS — G4730 Sleep apnea, unspecified: Secondary | ICD-10-CM | POA: Diagnosis not present

## 2014-02-06 DIAGNOSIS — R6520 Severe sepsis without septic shock: Secondary | ICD-10-CM | POA: Diagnosis not present

## 2014-02-06 DIAGNOSIS — E785 Hyperlipidemia, unspecified: Secondary | ICD-10-CM | POA: Diagnosis not present

## 2014-02-06 NOTE — Telephone Encounter (Signed)
Left VM for husband to return nurses call regarding pt need to be seen here tomorrow and have blood transfusion.  Called [facility] at [phone] and s/w nurse, [place].  Informed her of appts needed tomorrow.  She said we can fax them the schedule at fax [phone] and they will arrange transportation.

## 2014-02-06 NOTE — Telephone Encounter (Signed)
S/w Antoinette, nurse at [facility] and she confirmed they got the schedule for tomorrow.  They will arrange pt's transportation and husband is aware of appts.

## 2014-02-06 NOTE — Telephone Encounter (Signed)
Faxed Schedule for tomorrow to [facility] at fax [phone].

## 2014-02-07 ENCOUNTER — Other Ambulatory Visit (HOSPITAL_BASED_OUTPATIENT_CLINIC_OR_DEPARTMENT_OTHER): Payer: Medicare Other

## 2014-02-07 ENCOUNTER — Ambulatory Visit (HOSPITAL_BASED_OUTPATIENT_CLINIC_OR_DEPARTMENT_OTHER): Payer: Medicare Other

## 2014-02-07 ENCOUNTER — Telehealth: Payer: Self-pay | Admitting: Hematology and Oncology

## 2014-02-07 ENCOUNTER — Encounter: Payer: Self-pay | Admitting: Hematology and Oncology

## 2014-02-07 ENCOUNTER — Ambulatory Visit: Payer: Medicare Other

## 2014-02-07 ENCOUNTER — Telehealth: Payer: Self-pay | Admitting: *Deleted

## 2014-02-07 ENCOUNTER — Ambulatory Visit (HOSPITAL_BASED_OUTPATIENT_CLINIC_OR_DEPARTMENT_OTHER): Payer: Medicare Other | Admitting: Hematology and Oncology

## 2014-02-07 ENCOUNTER — Ambulatory Visit (HOSPITAL_COMMUNITY)
Admission: RE | Admit: 2014-02-07 | Discharge: 2014-02-07 | Disposition: A | Discharge status: Home / Self Care | Payer: No Typology Code available for payment source | Source: Ambulatory Visit | Attending: Hematology and Oncology | Admitting: Hematology and Oncology

## 2014-02-07 VITALS — BP 115/39 | HR 57 | Temp 97.6°F | Resp 18 | Ht 66.0 in | Wt 137.8 lb

## 2014-02-07 VITALS — BP 100/45 | HR 56 | Temp 98.1°F | Resp 20

## 2014-02-07 DIAGNOSIS — C8591 Non-Hodgkin lymphoma, unspecified, lymph nodes of head, face, and neck: Secondary | ICD-10-CM

## 2014-02-07 DIAGNOSIS — D630 Anemia in neoplastic disease: Secondary | ICD-10-CM | POA: Diagnosis not present

## 2014-02-07 DIAGNOSIS — D649 Anemia, unspecified: Secondary | ICD-10-CM

## 2014-02-07 LAB — COMPREHENSIVE METABOLIC PANEL (CC13)
ALT: 30 U/L (ref 0–55)
AST: 25 U/L (ref 5–34)
Albumin: 2.7 g/dL — ABNORMAL LOW (ref 3.5–5.0)
Alkaline Phosphatase: 109 U/L (ref 40–150)
Anion Gap: 10 mEq/L (ref 3–11)
BUN: 13.8 mg/dL (ref 7.0–26.0)
CALCIUM: 9.3 mg/dL (ref 8.4–10.4)
CHLORIDE: 100 meq/L (ref 98–109)
CO2: 26 mEq/L (ref 22–29)
Creatinine: 0.8 mg/dL (ref 0.6–1.1)
EGFR: 71 mL/min/{1.73_m2} — AB (ref >90)
GLUCOSE: 138 mg/dL (ref 70–140)
POTASSIUM: 4.9 meq/L (ref 3.5–5.1)
Sodium: 136 mEq/L (ref 136–145)
Total Bilirubin: 0.54 mg/dL (ref 0.20–1.20)
Total Protein: 5.6 g/dL — ABNORMAL LOW (ref 6.4–8.3)

## 2014-02-07 LAB — PREPARE RBC (CROSSMATCH)

## 2014-02-07 LAB — CBC WITH DIFFERENTIAL/PLATELET
BASO%: 1 % (ref 0.0–2.0)
BASOS ABS: 0.1 10*3/uL (ref 0.0–0.1)
EOS%: 8.5 % — ABNORMAL HIGH (ref 0.0–7.0)
Eosinophils Absolute: 0.8 10*3/uL — ABNORMAL HIGH (ref 0.0–0.5)
HEMATOCRIT: 24.1 % — AB (ref 34.8–46.6)
HEMOGLOBIN: 7.5 g/dL — AB (ref 11.6–15.9)
LYMPH#: 0.4 10*3/uL — AB (ref 0.9–3.3)
LYMPH%: 3.8 % — ABNORMAL LOW (ref 14.0–49.7)
MCH: 27.6 pg (ref 25.1–34.0)
MCHC: 31.1 g/dL — ABNORMAL LOW (ref 31.5–36.0)
MCV: 88.6 fL (ref 79.5–101.0)
MONO#: 1 10*3/uL — ABNORMAL HIGH (ref 0.1–0.9)
MONO%: 10.6 % (ref 0.0–14.0)
NEUT%: 76.1 % (ref 38.4–76.8)
NEUTROS ABS: 7.2 10*3/uL — AB (ref 1.5–6.5)
Platelets: 156 10*3/uL (ref 145–400)
RBC: 2.72 10*6/uL — ABNORMAL LOW (ref 3.70–5.45)
RDW: 17.7 % — AB (ref 11.2–14.5)
WBC: 9.4 10*3/uL (ref 3.9–10.3)

## 2014-02-07 LAB — ABO/RH: ABO/RH(D): O POS

## 2014-02-07 LAB — HOLD TUBE, BLOOD BANK

## 2014-02-07 LAB — URIC ACID (CC13): Uric Acid, Serum: 3 mg/dl (ref 2.6–7.4)

## 2014-02-07 LAB — LACTATE DEHYDROGENASE (CC13): LDH: 225 U/L (ref 125–245)

## 2014-02-07 MED ORDER — SODIUM CHLORIDE 0.9 % IV SOLN
250.0000 mL | Freq: Once | INTRAVENOUS | Status: AC
  Administered 2014-02-07: 250 mL via INTRAVENOUS

## 2014-02-07 MED ORDER — ACETAMINOPHEN 325 MG PO TABS
ORAL_TABLET | ORAL | Status: AC
  Filled 2014-02-07: qty 2

## 2014-02-07 MED ORDER — DIPHENHYDRAMINE HCL 25 MG PO CAPS
ORAL_CAPSULE | ORAL | Status: AC
  Filled 2014-02-07: qty 1

## 2014-02-07 MED ORDER — LIDOCAINE-PRILOCAINE 2.5-2.5 % EX CREA
TOPICAL_CREAM | CUTANEOUS | Status: AC
  Filled 2014-02-07: qty 5

## 2014-02-07 MED ORDER — SODIUM CHLORIDE 0.9 % IJ SOLN
10.0000 mL | INTRAMUSCULAR | Status: DC | PRN
  Filled 2014-02-07: qty 10

## 2014-02-07 MED ORDER — HEPARIN SOD (PORK) LOCK FLUSH 100 UNIT/ML IV SOLN
500.0000 [IU] | Freq: Every day | INTRAVENOUS | Status: DC | PRN
  Filled 2014-02-07: qty 5

## 2014-02-07 MED ORDER — DIPHENHYDRAMINE HCL 25 MG PO CAPS
25.0000 mg | ORAL_CAPSULE | Freq: Once | ORAL | Status: AC
  Administered 2014-02-07: 25 mg via ORAL

## 2014-02-07 MED ORDER — ACETAMINOPHEN 325 MG PO TABS
650.0000 mg | ORAL_TABLET | Freq: Once | ORAL | Status: AC
  Administered 2014-02-07: 650 mg via ORAL

## 2014-02-07 NOTE — Telephone Encounter (Signed)
Per 12/10 POF sent msg to add PRBC after MD visit on 12/16, gave pt AVS.... KJ

## 2014-02-07 NOTE — Progress Notes (Signed)
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   12/07/2013 Pathology Results Accession: [id number] biopsies show high-grade follicular lymphoma.   01/03/2014 Bone Marrow Biopsy Accession: [id number] Bone marrow biopsy is negative   01/04/2014 Imaging ECHO showed normal EF   01/04/2014 Procedure She has placement of port   01/09/2014 -  Chemotherapy She was started on bendamustine with rituximab   01/18/2014 - 02/01/2014 Hospital Admission She was admitted to the hospital from Escherichia coli sepsis with multiorgan failure and brief episodes of intubation. She was discharged to skilled nursing facility    INTERVAL HISTORY: Please see below for problem oriented charting. She is seen urgently today due to abnormal blood work. The patient was recently dismissed from the hospital after suffering from multi-organ failure. Recent routine blood work from SNF recently show severe anemia. The patient has profound weakness and having difficulties participating with rehabilitation efforts due to profound weakness and fatigue. She had some mild nausea, improved on anti-emetics. She has intermittent diarrhea recently, resolved. She has shortness of breath on  minimal exertion. REVIEW OF SYSTEMS:   Constitutional: Denies fevers, chills or abnormal weight loss Eyes: Denies blurriness of vision Ears, nose, mouth, throat, and face: Denies mucositis or sore throat Cardiovascular: Denies palpitation, chest discomfort or lower extremity swelling Skin: Denies abnormal skin rashes Lymphatics: Denies new lymphadenopathy or easy bruising Neurological:Denies numbness, tingling  Behavioral/Psych: Mood is stable, no new changes  All other systems were reviewed with the patient and are negative.  I have reviewed the past medical history, past surgical history, social history and family history with the patient and they are unchanged from previous note.  ALLERGIES:  is allergic to morphine and related; demerol; meperidine hcl; multaq; oysters; penicillins; and sulfa drugs cross reactors.  MEDICATIONS:  Current Outpatient Prescriptions  Medication Sig Dispense Refill  . acetaminophen (TYLENOL) 500 MG tablet Take 1,000 mg by mouth every 6 (six) hours as needed for mild pain.     Marland Kitchen acyclovir (ZOVIRAX) 400 MG tablet Take 1 tablet (400 mg total) by mouth daily. 30 tablet 3  . albuterol (PROAIR HFA) 108 (90 BASE) MCG/ACT inhaler Inhale 2 puffs into the lungs every 6 (six) hours as needed for wheezing or shortness of breath.    . allopurinol (ZYLOPRIM) 300 MG tablet Take 1 tablet (300 mg total) by mouth daily. 30 tablet 3  . Alum & Mag Hydroxide-Simeth (MAGIC MOUTHWASH W/LIDOCAINE) SOLN Take 5 mLs by mouth 4 (four) times daily as needed for mouth pain.  0  . Cholecalciferol (VITAMIN D) 2000 UNITS tablet Take 2,000 Units by mouth daily with lunch.    . conjugated estrogens (PREMARIN) vaginal cream Place 1 Applicatorful vaginally 3 (three) times a week. Monday,  Wednesday, and Friday nights    . dicyclomine (BENTYL) 10 MG capsule Take 10 mg by mouth daily as needed (ibs flare up).     . digoxin (LANOXIN) 0.125 MG tablet Take 125 mcg by mouth daily.     Marland Kitchen diltiazem  (CARDIZEM CD) 120 MG 24 hr capsule Take 120 mg by mouth daily.     . diphenoxylate-atropine (LOMOTIL) 2.5-0.025 MG per tablet Take 2 tablets by mouth 4 (four) times daily as needed for diarrhea or loose stools. 30 tablet 0  . ezetimibe (ZETIA) 10 MG tablet Take 10 mg by mouth daily.     . Fluticasone-Salmeterol (ADVAIR) 250-50 MCG/DOSE AEPB Inhale 1 puff into the lungs every 12 (twelve) hours.     . hydrochlorothiazide (HYDRODIURIL) 25 MG tablet Take 25 mg by mouth daily as needed (for ankle swelling).     Marland Kitchen levothyroxine (SYNTHROID, LEVOTHROID) 125 MCG tablet Take 250 mcg by mouth daily before breakfast.     . lidocaine-prilocaine (EMLA) cream Apply 1 application topically as needed. Apply to [facility] a Cath site at least one hour prior to needle stick as needed. 30 g 3  . Magnesium Oxide 500 MG TABS Take 500 mg by mouth at bedtime.    . metFORMIN (GLUCOPHAGE-XR) 500 MG 24 hr tablet Take 500 mg by mouth 2 (two) times daily with a meal.    . montelukast (SINGULAIR) 10 MG tablet Take 10 mg by mouth daily.    Marland Kitchen omega-3 acid ethyl esters (LOVAZA) 1 G capsule Take 1 g by mouth 2 (two) times daily.    . ondansetron (ZOFRAN) 8 MG tablet Take 1 tablet (8 mg total) by mouth every 8 (eight) hours as needed. 30 tablet 1  . potassium chloride SA (K-DUR,KLOR-CON) 20 MEQ tablet Take 1 tablet (20 mEq total) by mouth daily. 30 tablet 5  . pravastatin (PRAVACHOL) 40 MG tablet Take 20 mg by mouth See admin instructions. Take 1/2 tablet (20 mg) on Monday, Tuesday, Wednesday, and Thursday    . prochlorperazine (COMPAZINE) 10 MG tablet Take 1 tablet (10 mg total) by mouth every 6 (six) hours as needed (Nausea or vomiting). 30 tablet 1  . traMADol-acetaminophen (ULTRACET) 37.5-325 MG per tablet Take 1 tablet by mouth every 6 (six) hours as needed. 30 tablet 0  . triamterene-hydrochlorothiazide (MAXZIDE-25) 37.5-25 MG per tablet Take 1 tablet by mouth every morning.    . warfarin (COUMADIN) 5 MG tablet Take 2.5-5 mg by mouth  daily. Take 1/2 tablet (2.5 mg) on Wednesday and Saturday and take 1 tablet (5 mg) all other days     No current facility-administered medications for this visit.    PHYSICAL EXAMINATION: ECOG PERFORMANCE STATUS: 2 - Symptomatic, <50% confined to bed  Filed Vitals:   02/07/14 0921  BP: 115/39  Pulse: 57  Temp: 97.6 F (36.4 C)  Resp: 18   Filed Weights   02/07/14 0921  Weight: 137 lb 12.8 oz (62.506 kg)    GENERAL:alert, no distress and comfortable. She appears weak and debilitated SKIN: skin color is pale, texture, turgor are normal, no rashes or significant lesions EYES: normal, Conjunctiva are pale and non-injected, sclera clear OROPHARYNX:no exudate, no erythema and lips, buccal mucosa, and tongue normal  NECK: supple, thyroid normal size, non-tender, without nodularity LYMPH:  no palpable lymphadenopathy in the cervical, axillary or inguinal LUNGS: clear to auscultation and percussion with normal breathing effort HEART: regular rate & rhythm and no murmurs and no lower extremity edema ABDOMEN:abdomen soft, non-tender and  normal bowel sounds Musculoskeletal:no cyanosis of digits and no clubbing  NEURO: alert & oriented x 3 with fluent speech, no focal motor/sensory deficits  LABORATORY DATA:  I have reviewed the data as listed    Component Value Date/Time   NA 136 02/07/2014 0856   NA 140 01/31/2014 0600   K 4.9 02/07/2014 0856   K 3.6* 01/31/2014 0600   CL 101 01/31/2014 0600   CO2 26 02/07/2014 0856   CO2 29 01/31/2014 0600   GLUCOSE 138 02/07/2014 0856   GLUCOSE 106* 01/31/2014 0600   BUN 13.8 02/07/2014 0856   BUN 12 01/31/2014 0600   CREATININE 0.8 02/07/2014 0856   CREATININE 0.61 01/31/2014 0600   CALCIUM 9.3 02/07/2014 0856   CALCIUM 8.0* 01/31/2014 0600   PROT 5.6* 02/07/2014 0856   PROT 5.2* 01/27/2014 0400   ALBUMIN 2.7* 02/07/2014 0856   ALBUMIN 2.5* 01/27/2014 0400   AST 25 02/07/2014 0856   AST 15 01/27/2014 0400   ALT 30 02/07/2014 0856    ALT 15 01/27/2014 0400   ALKPHOS 109 02/07/2014 0856   ALKPHOS 67 01/27/2014 0400   BILITOT 0.54 02/07/2014 0856   BILITOT 0.4 01/27/2014 0400   GFRNONAA 87* 01/31/2014 0600   GFRAA >90 01/31/2014 0600    No results found for: SPEP, UPEP  Lab Results  Component Value Date   WBC 9.4 02/07/2014   NEUTROABS 7.2* 02/07/2014   HGB 7.5* 02/07/2014   HCT 24.1* 02/07/2014   MCV 88.6 02/07/2014   PLT 156 02/07/2014      Chemistry      Component Value Date/Time   NA 136 02/07/2014 0856   NA 140 01/31/2014 0600   K 4.9 02/07/2014 0856   K 3.6* 01/31/2014 0600   CL 101 01/31/2014 0600   CO2 26 02/07/2014 0856   CO2 29 01/31/2014 0600   BUN 13.8 02/07/2014 0856   BUN 12 01/31/2014 0600   CREATININE 0.8 02/07/2014 0856   CREATININE 0.61 01/31/2014 0600      Component Value Date/Time   CALCIUM 9.3 02/07/2014 0856   CALCIUM 8.0* 01/31/2014 0600   ALKPHOS 109 02/07/2014 0856   ALKPHOS 67 01/27/2014 0400   AST 25 02/07/2014 0856   AST 15 01/27/2014 0400   ALT 30 02/07/2014 0856   ALT 15 01/27/2014 0400   BILITOT 0.54 02/07/2014 0856   BILITOT 0.4 01/27/2014 0400     ASSESSMENT & PLAN:  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 Continue supportive care.  Anemia in neoplastic disease We discussed some of the risks, benefits, and alternatives of blood transfusions. The patient is symptomatic from anemia and the hemoglobin level is critically low.  Some of the side-effects to be expected including risks of transfusion reactions, chills, infection, syndrome of volume overload and risk of hospitalization from various reasons and the patient is willing to proceed and went ahead to sign consent today.  All questions were answered. The patient knows to call the clinic with any problems, questions or concerns. No barriers to learning was detected. I spent 30 minutes counseling the  patient face to face. The total time spent in the appointment was 40 minutes and more than 50% was on counseling and review of test results     [facility], [place], MD 02/07/2014 9:32 PM

## 2014-02-07 NOTE — Assessment & Plan Note (Signed)
We discussed some of the risks, benefits, and alternatives of blood transfusions. The patient is symptomatic from anemia and the hemoglobin level is critically low.  Some of the side-effects to be expected including risks of transfusion reactions, chills, infection, syndrome of volume overload and risk of hospitalization from various reasons and the patient is willing to proceed and went ahead to sign consent today.  

## 2014-02-07 NOTE — Patient Instructions (Signed)

## 2014-02-07 NOTE — Assessment & Plan Note (Signed)
Her treatment is placed on hold due to recent deconditioning from recent hospitalization. Continue supportive care.

## 2014-02-07 NOTE — Telephone Encounter (Signed)
Per staff message and POF I have scheduled appts. Advised scheduler of appts. JMW  

## 2014-02-08 LAB — TYPE AND SCREEN
ABO/RH(D): O POS
Antibody Screen: NEGATIVE
UNIT DIVISION: 0

## 2014-02-12 DIAGNOSIS — C8291 Follicular lymphoma, unspecified, lymph nodes of head, face, and neck: Secondary | ICD-10-CM | POA: Diagnosis not present

## 2014-02-12 DIAGNOSIS — I4891 Unspecified atrial fibrillation: Secondary | ICD-10-CM | POA: Diagnosis not present

## 2014-02-12 DIAGNOSIS — D630 Anemia in neoplastic disease: Secondary | ICD-10-CM | POA: Diagnosis not present

## 2014-02-12 DIAGNOSIS — R110 Nausea: Secondary | ICD-10-CM | POA: Diagnosis not present

## 2014-02-13 ENCOUNTER — Ambulatory Visit (HOSPITAL_BASED_OUTPATIENT_CLINIC_OR_DEPARTMENT_OTHER): Payer: Medicare Other | Admitting: Hematology and Oncology

## 2014-02-13 ENCOUNTER — Other Ambulatory Visit (HOSPITAL_BASED_OUTPATIENT_CLINIC_OR_DEPARTMENT_OTHER): Payer: Medicare Other

## 2014-02-13 ENCOUNTER — Telehealth: Payer: Self-pay | Admitting: Hematology and Oncology

## 2014-02-13 VITALS — BP 119/45 | HR 71 | Temp 98.0°F | Resp 18 | Ht 66.0 in | Wt 126.7 lb

## 2014-02-13 DIAGNOSIS — R1310 Dysphagia, unspecified: Secondary | ICD-10-CM | POA: Diagnosis not present

## 2014-02-13 DIAGNOSIS — E46 Unspecified protein-calorie malnutrition: Secondary | ICD-10-CM | POA: Diagnosis not present

## 2014-02-13 DIAGNOSIS — C8591 Non-Hodgkin lymphoma, unspecified, lymph nodes of head, face, and neck: Secondary | ICD-10-CM

## 2014-02-13 DIAGNOSIS — D630 Anemia in neoplastic disease: Secondary | ICD-10-CM

## 2014-02-13 DIAGNOSIS — Z7901 Long term (current) use of anticoagulants: Secondary | ICD-10-CM | POA: Diagnosis not present

## 2014-02-13 DIAGNOSIS — C8291 Follicular lymphoma, unspecified, lymph nodes of head, face, and neck: Secondary | ICD-10-CM | POA: Diagnosis not present

## 2014-02-13 LAB — CBC WITH DIFFERENTIAL/PLATELET
BASO%: 1 % (ref 0.0–2.0)
Basophils Absolute: 0.1 10*3/uL (ref 0.0–0.1)
EOS ABS: 0.7 10*3/uL — AB (ref 0.0–0.5)
EOS%: 9.4 % — ABNORMAL HIGH (ref 0.0–7.0)
HCT: 29.3 % — ABNORMAL LOW (ref 34.8–46.6)
HGB: 9.5 g/dL — ABNORMAL LOW (ref 11.6–15.9)
LYMPH%: 6.9 % — AB (ref 14.0–49.7)
MCH: 28.2 pg (ref 25.1–34.0)
MCHC: 32.5 g/dL (ref 31.5–36.0)
MCV: 86.7 fL (ref 79.5–101.0)
MONO#: 0.6 10*3/uL (ref 0.1–0.9)
MONO%: 8 % (ref 0.0–14.0)
NEUT%: 74.7 % (ref 38.4–76.8)
NEUTROS ABS: 5.4 10*3/uL (ref 1.5–6.5)
PLATELETS: 232 10*3/uL (ref 145–400)
RBC: 3.38 10*6/uL — AB (ref 3.70–5.45)
RDW: 18.3 % — ABNORMAL HIGH (ref 11.2–14.5)
WBC: 7.2 10*3/uL (ref 3.9–10.3)
lymph#: 0.5 10*3/uL — ABNORMAL LOW (ref 0.9–3.3)

## 2014-02-13 LAB — COMPREHENSIVE METABOLIC PANEL (CC13)
ALK PHOS: 134 U/L (ref 40–150)
ALT: 38 U/L (ref 0–55)
AST: 25 U/L (ref 5–34)
Albumin: 3.2 g/dL — ABNORMAL LOW (ref 3.5–5.0)
Anion Gap: 9 mEq/L (ref 3–11)
BUN: 13.6 mg/dL (ref 7.0–26.0)
CO2: 30 mEq/L — ABNORMAL HIGH (ref 22–29)
Calcium: 9.7 mg/dL (ref 8.4–10.4)
Chloride: 98 mEq/L (ref 98–109)
Creatinine: 0.7 mg/dL (ref 0.6–1.1)
EGFR: 80 mL/min/{1.73_m2} — AB (ref >90)
GLUCOSE: 104 mg/dL (ref 70–140)
Potassium: 5.1 mEq/L (ref 3.5–5.1)
SODIUM: 137 meq/L (ref 136–145)
TOTAL PROTEIN: 6.5 g/dL (ref 6.4–8.3)
Total Bilirubin: 0.56 mg/dL (ref 0.20–1.20)

## 2014-02-13 LAB — URIC ACID (CC13): Uric Acid, Serum: 3.6 mg/dl (ref 2.6–7.4)

## 2014-02-13 LAB — HOLD TUBE, BLOOD BANK

## 2014-02-13 LAB — LACTATE DEHYDROGENASE (CC13): LDH: 266 U/L — AB (ref 125–245)

## 2014-02-13 NOTE — Assessment & Plan Note (Signed)
This is likely anemia of chronic disease. The patient denies recent history of bleeding such as epistaxis, hematuria or hematochezia. She is asymptomatic from the anemia. We will observe for now.  She does not require transfusion now.   

## 2014-02-13 NOTE — Progress Notes (Signed)
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   12/07/2013 Pathology Results Accession: [id number] biopsies show high-grade follicular lymphoma.   01/03/2014 Bone Marrow Biopsy Accession: [id number] Bone marrow biopsy is negative   01/04/2014 Imaging ECHO showed normal EF   01/04/2014 Procedure She has placement of port   01/09/2014 -  Chemotherapy She was started on bendamustine with rituximab   01/18/2014 - 02/01/2014 Hospital Admission She was admitted to the hospital from Escherichia coli sepsis with multiorgan failure and brief episodes of intubation. She was discharged to skilled nursing facility    INTERVAL HISTORY: Please see below for problem oriented charting. She is seen for supportive care. She feels stronger and was able to participate in rehabilitation since blood transfusion. The patient denies any recent signs or symptoms of bleeding such as spontaneous epistaxis, hematuria or hematochezia. She is eating better except she does complain of mild dysphagia. Denies recent aspiration.  REVIEW OF SYSTEMS:   Constitutional: Denies fevers, chills or abnormal weight loss Eyes: Denies blurriness of vision Ears, nose, mouth, throat, and  face: Denies mucositis or sore throat Respiratory: Denies cough, dyspnea or wheezes Cardiovascular: Denies palpitation, chest discomfort or lower extremity swelling Gastrointestinal:  Denies nausea, heartburn or change in bowel habits Skin: Denies abnormal skin rashes Lymphatics: Denies new lymphadenopathy or easy bruising Neurological:Denies numbness, tingling or new weaknesses Behavioral/Psych: Mood is stable, no new changes  All other systems were reviewed with the patient and are negative.  I have reviewed the past medical history, past surgical history, social history and family history with the patient and they are unchanged from previous note.  ALLERGIES:  is allergic to morphine and related; demerol; meperidine hcl; multaq; oysters; penicillins; and sulfa drugs cross reactors.  MEDICATIONS:  Current Outpatient Prescriptions  Medication Sig Dispense Refill  . acetaminophen (TYLENOL) 500 MG tablet Take 1,000 mg by mouth every 6 (six) hours as needed for mild pain.     Marland Kitchen acyclovir (ZOVIRAX) 400 MG tablet Take 1 tablet (400 mg total) by mouth daily. 30 tablet 3  . albuterol (PROAIR HFA) 108 (90 BASE) MCG/ACT inhaler Inhale 2 puffs into the lungs every 6 (six) hours as needed for wheezing or shortness of breath.    . allopurinol (ZYLOPRIM) 300 MG tablet Take 1 tablet (300 mg total) by mouth daily. 30 tablet 3  . Cholecalciferol (VITAMIN D) 2000 UNITS tablet Take 2,000 Units by mouth daily with lunch.    . conjugated estrogens (PREMARIN) vaginal cream Place 1 Applicatorful vaginally 3 (three) times a week. Monday, Wednesday, and Friday nights    . dicyclomine (BENTYL) 10 MG capsule Take 10 mg by mouth daily as needed (ibs flare up).     . digoxin (  LANOXIN) 0.125 MG tablet Take 125 mcg by mouth daily.     . diltiazem (CARDIZEM CD) 120 MG 24 hr capsule Take 120 mg by mouth daily.     . ezetimibe (ZETIA) 10 MG tablet Take 10 mg by mouth daily.     . Fluticasone-Salmeterol (ADVAIR) 250-50  MCG/DOSE AEPB Inhale 1 puff into the lungs every 12 (twelve) hours.     . hydrochlorothiazide (HYDRODIURIL) 25 MG tablet Take 25 mg by mouth daily as needed (for ankle swelling).     . levothyroxine (SYNTHROID, LEVOTHROID) 125 MCG tablet Take 250 mcg by mouth daily before breakfast.     . Magnesium Oxide 500 MG TABS Take 500 mg by mouth at bedtime.    . metFORMIN (GLUCOPHAGE-XR) 500 MG 24 hr tablet Take 500 mg by mouth 2 (two) times daily with a meal.    . montelukast (SINGULAIR) 10 MG tablet Take 10 mg by mouth daily.    . omega-3 acid ethyl esters (LOVAZA) 1 G capsule Take 1 g by mouth 2 (two) times daily.    . ondansetron (ZOFRAN) 8 MG tablet Take 1 tablet (8 mg total) by mouth every 8 (eight) hours as needed. 30 tablet 1  . potassium chloride SA (K-DUR,KLOR-CON) 20 MEQ tablet Take 1 tablet (20 mEq total) by mouth daily. 30 tablet 5  . pravastatin (PRAVACHOL) 40 MG tablet Take 20 mg by mouth See admin instructions. Take 1/2 tablet (20 mg) on Monday, Tuesday, Wednesday, and Thursday    . prochlorperazine (COMPAZINE) 10 MG tablet Take 1 tablet (10 mg total) by mouth every 6 (six) hours as needed (Nausea or vomiting). 30 tablet 1  . traMADol-acetaminophen (ULTRACET) 37.5-325 MG per tablet Take 1 tablet by mouth every 6 (six) hours as needed. 30 tablet 0  . triamterene-hydrochlorothiazide (MAXZIDE-25) 37.5-25 MG per tablet Take 1 tablet by mouth every morning.    . warfarin (COUMADIN) 5 MG tablet Take 2.5-5 mg by mouth daily. Take 1/2 tablet (2.5 mg) on Wednesday and Saturday and take 1 tablet (5 mg) all other days    . Alum & Mag Hydroxide-Simeth (MAGIC MOUTHWASH W/LIDOCAINE) SOLN Take 5 mLs by mouth 4 (four) times daily as needed for mouth pain. (Patient not taking: Reported on 02/13/2014)  0  . diphenoxylate-atropine (LOMOTIL) 2.5-0.025 MG per tablet Take 2 tablets by mouth 4 (four) times daily as needed for diarrhea or loose stools. (Patient not taking: Reported on 02/13/2014) 30 tablet 0  .  lidocaine-prilocaine (EMLA) cream Apply 1 application topically as needed. Apply to [place] a Cath site at least one hour prior to needle stick as needed. (Patient not taking: Reported on 02/13/2014) 30 g 3   No current facility-administered medications for this visit.    PHYSICAL EXAMINATION: ECOG PERFORMANCE STATUS: 1 - Symptomatic but completely ambulatory  Filed Vitals:   02/13/14 1057  BP: 119/45  Pulse: 71  Temp: 98 F (36.7 C)  Resp: 18   Filed Weights   02/13/14 1057  Weight: 126 lb 11.2 oz (57.471 kg)    GENERAL:alert, no distress and comfortable SKIN: skin color, texture, turgor are normal, no rashes or significant lesions EYES: normal, Conjunctiva are pink and non-injected, sclera clear OROPHARYNX:no exudate, no erythema and lips, buccal mucosa, and tongue normal  NECK: supple, thyroid normal size, non-tender, without nodularity LYMPH:  no palpable lymphadenopathy in the cervical, axillary or inguinal LUNGS: clear to auscultation and percussion with normal breathing effort HEART: regular rate & rhythm and no murmurs and no lower   extremity edema ABDOMEN:abdomen soft, non-tender and normal bowel sounds Musculoskeletal:no cyanosis of digits and no clubbing  NEURO: alert & oriented x 3 with fluent speech, no focal motor/sensory deficits  LABORATORY DATA:  I have reviewed the data as listed    Component Value Date/Time   NA 137 02/13/2014 1041   NA 140 01/31/2014 0600   K 5.1 02/13/2014 1041   K 3.6* 01/31/2014 0600   CL 101 01/31/2014 0600   CO2 30* 02/13/2014 1041   CO2 29 01/31/2014 0600   GLUCOSE 104 02/13/2014 1041   GLUCOSE 106* 01/31/2014 0600   BUN 13.6 02/13/2014 1041   BUN 12 01/31/2014 0600   CREATININE 0.7 02/13/2014 1041   CREATININE 0.61 01/31/2014 0600   CALCIUM 9.7 02/13/2014 1041   CALCIUM 8.0* 01/31/2014 0600   PROT 6.5 02/13/2014 1041   PROT 5.2* 01/27/2014 0400   ALBUMIN 3.2* 02/13/2014 1041   ALBUMIN 2.5* 01/27/2014 0400   AST 25  02/13/2014 1041   AST 15 01/27/2014 0400   ALT 38 02/13/2014 1041   ALT 15 01/27/2014 0400   ALKPHOS 134 02/13/2014 1041   ALKPHOS 67 01/27/2014 0400   BILITOT 0.56 02/13/2014 1041   BILITOT 0.4 01/27/2014 0400   GFRNONAA 87* 01/31/2014 0600   GFRAA >90 01/31/2014 0600    No results found for: SPEP, UPEP  Lab Results  Component Value Date   WBC 7.2 02/13/2014   NEUTROABS 5.4 02/13/2014   HGB 9.5* 02/13/2014   HCT 29.3* 02/13/2014   MCV 86.7 02/13/2014   PLT 232 02/13/2014      Chemistry      Component Value Date/Time   NA 137 02/13/2014 1041   NA 140 01/31/2014 0600   K 5.1 02/13/2014 1041   K 3.6* 01/31/2014 0600   CL 101 01/31/2014 0600   CO2 30* 02/13/2014 1041   CO2 29 01/31/2014 0600   BUN 13.6 02/13/2014 1041   BUN 12 01/31/2014 0600   CREATININE 0.7 02/13/2014 1041   CREATININE 0.61 01/31/2014 0600      Component Value Date/Time   CALCIUM 9.7 02/13/2014 1041   CALCIUM 8.0* 01/31/2014 0600   ALKPHOS 134 02/13/2014 1041   ALKPHOS 67 01/27/2014 0400   AST 25 02/13/2014 1041   AST 15 01/27/2014 0400   ALT 38 02/13/2014 1041   ALT 15 01/27/2014 0400   BILITOT 0.56 02/13/2014 1041   BILITOT 0.4 01/27/2014 0400     ASSESSMENT & PLAN:  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 Continue supportive care. I plan to see her back after Christmas and if she feels fine we will resume her treatment. Before that, I would recommend repeat staging scan to assess response to treatment.   Anemia in neoplastic disease This is likely anemia of chronic disease. The patient denies recent history of bleeding such as epistaxis, hematuria or hematochezia. She is asymptomatic from the anemia. We will observe for now.  She does not require transfusion now.   Protein calorie malnutrition This is improving. I recommend she increase oral intake as tolerated. She had mild  dysphagia, could be due to extrinsic compression of lymph node to her esophagus. I recommend soft diet for now until we have imaging study for review.   Orders Placed This Encounter  Procedures  . NM PET Image Restag (PS) Skull Base To Thigh    Standing Status: Future       Number of Occurrences:      Standing Expiration Date: 04/15/2015    Order Specific Question:  Reason for Exam (SYMPTOM  OR DIAGNOSIS REQUIRED)    Answer:  staging lymphoma, assess response to Rx    Order Specific Question:  Preferred imaging location?    Answer:  [facility]  . Lactate dehydrogenase    Standing Status: Future     Number of Occurrences:      Standing Expiration Date: 03/20/2015  . Hold Tube, Blood Bank    Standing Status: Future     Number of Occurrences:      Standing Expiration Date: 03/20/2015   All questions were answered. The patient knows to call the clinic with any problems, questions or concerns. No barriers to learning was detected. I spent 25 minutes counseling the patient face to face. The total time spent in the appointment was 30 minutes and more than 50% was on counseling and review of test results     , , MD 02/13/2014 4:02 PM    

## 2014-02-13 NOTE — Assessment & Plan Note (Signed)
Her treatment is placed on hold due to recent deconditioning from recent hospitalization. Continue supportive care. I plan to see her back after Christmas and if she feels fine we will resume her treatment. Before that, I would recommend repeat staging scan to assess response to treatment.

## 2014-02-13 NOTE — Assessment & Plan Note (Signed)
This is improving. I recommend she increase oral intake as tolerated. She had mild dysphagia, could be due to extrinsic compression of lymph node to her esophagus. I recommend soft diet for now until we have imaging study for review.

## 2014-02-13 NOTE — Telephone Encounter (Signed)
Gave avs & cal for Dec. °

## 2014-02-18 DIAGNOSIS — E119 Type 2 diabetes mellitus without complications: Secondary | ICD-10-CM | POA: Diagnosis not present

## 2014-02-18 DIAGNOSIS — C8220 Follicular lymphoma grade III, unspecified, unspecified site: Secondary | ICD-10-CM | POA: Diagnosis not present

## 2014-02-19 ENCOUNTER — Encounter: Payer: Self-pay | Admitting: Hematology and Oncology

## 2014-02-25 DIAGNOSIS — C8220 Follicular lymphoma grade III, unspecified, unspecified site: Secondary | ICD-10-CM | POA: Diagnosis not present

## 2014-02-25 DIAGNOSIS — E119 Type 2 diabetes mellitus without complications: Secondary | ICD-10-CM | POA: Diagnosis not present

## 2014-02-26 ENCOUNTER — Encounter (HOSPITAL_COMMUNITY): Payer: Self-pay

## 2014-02-26 ENCOUNTER — Ambulatory Visit (HOSPITAL_COMMUNITY)
Admission: RE | Admit: 2014-02-26 | Discharge: 2014-02-26 | Disposition: A | Discharge status: Home / Self Care | Payer: Medicare Other | Source: Ambulatory Visit | Attending: Hematology and Oncology | Admitting: Hematology and Oncology

## 2014-02-26 ENCOUNTER — Other Ambulatory Visit (HOSPITAL_BASED_OUTPATIENT_CLINIC_OR_DEPARTMENT_OTHER): Payer: Medicare Other

## 2014-02-26 DIAGNOSIS — E119 Type 2 diabetes mellitus without complications: Secondary | ICD-10-CM | POA: Diagnosis not present

## 2014-02-26 DIAGNOSIS — C8591 Non-Hodgkin lymphoma, unspecified, lymph nodes of head, face, and neck: Secondary | ICD-10-CM

## 2014-02-26 DIAGNOSIS — C8580 Other specified types of non-Hodgkin lymphoma, unspecified site: Secondary | ICD-10-CM | POA: Diagnosis not present

## 2014-02-26 LAB — COMPREHENSIVE METABOLIC PANEL (CC13)
ALBUMIN: 3.5 g/dL (ref 3.5–5.0)
ALK PHOS: 115 U/L (ref 40–150)
ALT: 21 U/L (ref 0–55)
AST: 21 U/L (ref 5–34)
Anion Gap: 10 mEq/L (ref 3–11)
BUN: 12.2 mg/dL (ref 7.0–26.0)
CO2: 29 mEq/L (ref 22–29)
Calcium: 9.7 mg/dL (ref 8.4–10.4)
Chloride: 103 mEq/L (ref 98–109)
Creatinine: 0.7 mg/dL (ref 0.6–1.1)
EGFR: 86 mL/min/{1.73_m2} — ABNORMAL LOW (ref >90)
Glucose: 98 mg/dl (ref 70–140)
POTASSIUM: 4.6 meq/L (ref 3.5–5.1)
Sodium: 141 mEq/L (ref 136–145)
Total Bilirubin: 0.5 mg/dL (ref 0.20–1.20)
Total Protein: 6.5 g/dL (ref 6.4–8.3)

## 2014-02-26 LAB — CBC WITH DIFFERENTIAL/PLATELET
BASO%: 0.9 % (ref 0.0–2.0)
Basophils Absolute: 0.1 10*3/uL (ref 0.0–0.1)
EOS%: 0.9 % (ref 0.0–7.0)
Eosinophils Absolute: 0.1 10*3/uL (ref 0.0–0.5)
HCT: 30.5 % — ABNORMAL LOW (ref 34.8–46.6)
HGB: 9.8 g/dL — ABNORMAL LOW (ref 11.6–15.9)
LYMPH%: 14.2 % (ref 14.0–49.7)
MCH: 28.4 pg (ref 25.1–34.0)
MCHC: 32.1 g/dL (ref 31.5–36.0)
MCV: 88.4 fL (ref 79.5–101.0)
MONO#: 0.9 10*3/uL (ref 0.1–0.9)
MONO%: 13.7 % (ref 0.0–14.0)
NEUT#: 4.5 10*3/uL (ref 1.5–6.5)
NEUT%: 70.3 % (ref 38.4–76.8)
Platelets: 228 10*3/uL (ref 145–400)
RBC: 3.45 10*6/uL — ABNORMAL LOW (ref 3.70–5.45)
RDW: 17.7 % — ABNORMAL HIGH (ref 11.2–14.5)
WBC: 6.4 10*3/uL (ref 3.9–10.3)
lymph#: 0.9 10*3/uL (ref 0.9–3.3)

## 2014-02-26 LAB — TECHNOLOGIST REVIEW

## 2014-02-26 LAB — HOLD TUBE, BLOOD BANK

## 2014-02-26 LAB — GLUCOSE, CAPILLARY: GLUCOSE-CAPILLARY: 100 mg/dL — AB (ref 70–99)

## 2014-02-26 LAB — LACTATE DEHYDROGENASE (CC13): LDH: 292 U/L — ABNORMAL HIGH (ref 125–245)

## 2014-02-26 MED ORDER — FLUDEOXYGLUCOSE F - 18 (FDG) INJECTION
6.6000 | Freq: Once | INTRAVENOUS | Status: AC | PRN
  Administered 2014-02-26: 6.6 via INTRAVENOUS

## 2014-02-27 ENCOUNTER — Telehealth: Payer: Self-pay | Admitting: *Deleted

## 2014-02-27 ENCOUNTER — Telehealth: Payer: Self-pay | Admitting: Hematology and Oncology

## 2014-02-27 ENCOUNTER — Ambulatory Visit (HOSPITAL_BASED_OUTPATIENT_CLINIC_OR_DEPARTMENT_OTHER): Payer: Medicare Other | Admitting: Hematology and Oncology

## 2014-02-27 VITALS — BP 140/56 | HR 96 | Temp 98.1°F | Resp 18 | Ht 66.0 in | Wt 131.4 lb

## 2014-02-27 DIAGNOSIS — D630 Anemia in neoplastic disease: Secondary | ICD-10-CM | POA: Diagnosis not present

## 2014-02-27 DIAGNOSIS — C8591 Non-Hodgkin lymphoma, unspecified, lymph nodes of head, face, and neck: Secondary | ICD-10-CM | POA: Diagnosis not present

## 2014-02-27 NOTE — Assessment & Plan Note (Signed)
Repeat PET/CT scan showed near complete response to treatment. I estimate that she will need a minimum of 4 cycles of treatment before we can stop. I recommend drastric dose adjustment to bendamustine at only one third of the calculated dose for future treatment due to recent multi-organ failure. She is in agreement to proceed. I plan to see her on a weekly basis after her next cycle of treatment for aggressive supportive care.

## 2014-02-27 NOTE — Progress Notes (Signed)
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   01/03/2014 Bone Marrow Biopsy Accession: [id number] Bone marrow biopsy is negative   01/04/2014 Imaging ECHO showed normal EF   01/04/2014 Procedure She has placement of port   01/09/2014 -  Chemotherapy She was started on bendamustine with rituximab   01/18/2014 - 02/01/2014 Hospital Admission She was admitted to the hospital from Escherichia coli sepsis with multiorgan failure and brief episodes of intubation. She was discharged to skilled nursing facility   02/26/2014 Imaging PET/CT scan showed near complete remission   02/27/2014 Adverse Reaction Cycle 2 of treatment was resumed with drastic dose adjustment to bendamustine due to recent multi-organ failure    INTERVAL HISTORY: Please see below for problem oriented charting. She returns today to review test results of her scan. She is recovering well from recent hospitalization. She is eating well. Denies recent nausea, vomiting or diarrhea.  REVIEW OF SYSTEMS:   Constitutional: Denies fevers, chills or abnormal weight loss Eyes: Denies blurriness  of vision Ears, nose, mouth, throat, and face: Denies mucositis or sore throat Respiratory: Denies cough, dyspnea or wheezes Cardiovascular: Denies palpitation, chest discomfort or lower extremity swelling Gastrointestinal:  Denies nausea, heartburn or change in bowel habits Skin: Denies abnormal skin rashes Lymphatics: Denies new lymphadenopathy or easy bruising Neurological:Denies numbness, tingling or new weaknesses Behavioral/Psych: Mood is stable, no new changes  All other systems were reviewed with the patient and are negative.  I have reviewed the past medical history, past surgical history, social history and family history with the patient and they are unchanged from previous note.  ALLERGIES:  is allergic to morphine and related; demerol; meperidine hcl; multaq; oysters; penicillins; and sulfa drugs cross reactors.  MEDICATIONS:  Current Outpatient Prescriptions  Medication Sig Dispense Refill  . acetaminophen (TYLENOL) 500 MG tablet Take 1,000 mg by mouth every 6 (six) hours as needed for mild pain.     Marland Kitchen acyclovir (ZOVIRAX) 400 MG tablet Take 1 tablet (400 mg total) by mouth daily. 30 tablet 3  . albuterol (PROAIR HFA) 108 (90 BASE) MCG/ACT inhaler Inhale 2 puffs into the lungs every 6 (six) hours as needed for wheezing or shortness of breath.    . allopurinol (ZYLOPRIM) 300 MG tablet Take 1 tablet (300 mg total) by mouth daily. 30 tablet 3  . Cholecalciferol (VITAMIN D) 2000 UNITS tablet Take 2,000 Units by mouth daily with lunch.    . conjugated estrogens (PREMARIN) vaginal cream Place 1 Applicatorful vaginally 3 (three) times a week. Monday, Wednesday, and Friday nights    . Dentifrices (BIOTENE DRY MOUTH CARE DT) Place onto teeth.    Marland Kitchen  dicyclomine (BENTYL) 10 MG capsule Take 10 mg by mouth daily as needed (ibs flare up).     . digoxin (LANOXIN) 0.125 MG tablet Take 125 mcg by mouth daily.     Marland Kitchen diltiazem (CARDIZEM CD) 120 MG 24 hr capsule Take 120 mg by mouth daily.     .  diphenoxylate-atropine (LOMOTIL) 2.5-0.025 MG per tablet Take 2 tablets by mouth 4 (four) times daily as needed for diarrhea or loose stools. 30 tablet 0  . ezetimibe (ZETIA) 10 MG tablet Take 10 mg by mouth daily.     . Fluticasone-Salmeterol (ADVAIR) 250-50 MCG/DOSE AEPB Inhale 1 puff into the lungs every 12 (twelve) hours.     . hydrochlorothiazide (HYDRODIURIL) 25 MG tablet Take 25 mg by mouth daily as needed (for ankle swelling).     Marland Kitchen levothyroxine (SYNTHROID, LEVOTHROID) 125 MCG tablet Take 250 mcg by mouth daily before breakfast.     . Magnesium Oxide 500 MG TABS Take 500 mg by mouth at bedtime.    . metFORMIN (GLUCOPHAGE-XR) 500 MG 24 hr tablet Take 500 mg by mouth 2 (two) times daily with a meal.    . montelukast (SINGULAIR) 10 MG tablet Take 10 mg by mouth daily.    Marland Kitchen omega-3 acid ethyl esters (LOVAZA) 1 G capsule Take 1 g by mouth 2 (two) times daily.    . ondansetron (ZOFRAN) 8 MG tablet Take 1 tablet (8 mg total) by mouth every 8 (eight) hours as needed. 30 tablet 1  . potassium chloride SA (K-DUR,KLOR-CON) 20 MEQ tablet Take 1 tablet (20 mEq total) by mouth daily. 30 tablet 5  . pravastatin (PRAVACHOL) 40 MG tablet Take 20 mg by mouth See admin instructions. Take 1/2 tablet (20 mg) on Monday, Tuesday, Wednesday, and Thursday    . prochlorperazine (COMPAZINE) 10 MG tablet Take 1 tablet (10 mg total) by mouth every 6 (six) hours as needed (Nausea or vomiting). 30 tablet 1  . traMADol-acetaminophen (ULTRACET) 37.5-325 MG per tablet Take 1 tablet by mouth every 6 (six) hours as needed. 30 tablet 0  . triamterene-hydrochlorothiazide (MAXZIDE-25) 37.5-25 MG per tablet Take 1 tablet by mouth every morning.    . warfarin (COUMADIN) 5 MG tablet Take 2.5-5 mg by mouth daily. Take 1/2 tablet (2.5 mg) on Wednesday and Saturday and take 1 tablet (5 mg) all other days    . Alum & Mag Hydroxide-Simeth (MAGIC MOUTHWASH W/LIDOCAINE) SOLN Take 5 mLs by mouth 4 (four) times daily as needed for mouth  pain. (Patient not taking: Reported on 02/13/2014)  0  . lidocaine-prilocaine (EMLA) cream Apply 1 application topically as needed. Apply to [facility] a Cath site at least one hour prior to needle stick as needed. (Patient not taking: Reported on 02/13/2014) 30 g 3   No current facility-administered medications for this visit.    PHYSICAL EXAMINATION: ECOG PERFORMANCE STATUS: 0 - Asymptomatic  Filed Vitals:   02/27/14 1126  BP: 140/56  Pulse: 96  Temp: 98.1 F (36.7 C)  Resp: 18   Filed Weights   02/27/14 1126  Weight: 131 lb 6.4 oz (59.603 kg)    GENERAL:alert, no distress and comfortable SKIN: skin color, texture, turgor are normal, no rashes or significant lesions EYES: normal, Conjunctiva are pink and non-injected, sclera clear OROPHARYNX:no exudate, no erythema and lips, buccal mucosa, and tongue normal  NECK: supple, thyroid normal size, non-tender, without nodularity LYMPH:  no palpable lymphadenopathy in the cervical, axillary or inguinal LUNGS: clear to auscultation and percussion with normal  breathing effort HEART: regular rate & rhythm and no murmurs and no lower extremity edema ABDOMEN:abdomen soft, non-tender and normal bowel sounds Musculoskeletal:no cyanosis of digits and no clubbing  NEURO: alert & oriented x 3 with fluent speech, no focal motor/sensory deficits  LABORATORY DATA:  I have reviewed the data as listed    Component Value Date/Time   NA 141 02/26/2014 1307   NA 140 01/31/2014 0600   K 4.6 02/26/2014 1307   K 3.6* 01/31/2014 0600   CL 101 01/31/2014 0600   CO2 29 02/26/2014 1307   CO2 29 01/31/2014 0600   GLUCOSE 98 02/26/2014 1307   GLUCOSE 106* 01/31/2014 0600   BUN 12.2 02/26/2014 1307   BUN 12 01/31/2014 0600   CREATININE 0.7 02/26/2014 1307   CREATININE 0.61 01/31/2014 0600   CALCIUM 9.7 02/26/2014 1307   CALCIUM 8.0* 01/31/2014 0600   PROT 6.5 02/26/2014 1307   PROT 5.2* 01/27/2014 0400   ALBUMIN 3.5 02/26/2014 1307   ALBUMIN 2.5*  01/27/2014 0400   AST 21 02/26/2014 1307   AST 15 01/27/2014 0400   ALT 21 02/26/2014 1307   ALT 15 01/27/2014 0400   ALKPHOS 115 02/26/2014 1307   ALKPHOS 67 01/27/2014 0400   BILITOT 0.50 02/26/2014 1307   BILITOT 0.4 01/27/2014 0400   GFRNONAA 87* 01/31/2014 0600   GFRAA >90 01/31/2014 0600    No results found for: SPEP, UPEP  Lab Results  Component Value Date   WBC 6.4 02/26/2014   NEUTROABS 4.5 02/26/2014   HGB 9.8* 02/26/2014   HCT 30.5* 02/26/2014   MCV 88.4 02/26/2014   PLT 228 02/26/2014      Chemistry      Component Value Date/Time   NA 141 02/26/2014 1307   NA 140 01/31/2014 0600   K 4.6 02/26/2014 1307   K 3.6* 01/31/2014 0600   CL 101 01/31/2014 0600   CO2 29 02/26/2014 1307   CO2 29 01/31/2014 0600   BUN 12.2 02/26/2014 1307   BUN 12 01/31/2014 0600   CREATININE 0.7 02/26/2014 1307   CREATININE 0.61 01/31/2014 0600      Component Value Date/Time   CALCIUM 9.7 02/26/2014 1307   CALCIUM 8.0* 01/31/2014 0600   ALKPHOS 115 02/26/2014 1307   ALKPHOS 67 01/27/2014 0400   AST 21 02/26/2014 1307   AST 15 01/27/2014 0400   ALT 21 02/26/2014 1307   ALT 15 01/27/2014 0400   BILITOT 0.50 02/26/2014 1307   BILITOT 0.4 01/27/2014 0400       RADIOGRAPHIC STUDIES: I reviewed the imaging study with the patient and her husband I have personally reviewed the radiological images as listed and agreed with the findings in the report. Nm Pet Image Restag (ps) Skull Base To Thigh  02/26/2014   CLINICAL DATA:  Subsequent treatment strategy for lymphoma.  EXAM: NUCLEAR MEDICINE PET SKULL BASE TO THIGH  TECHNIQUE: 6.6 mCi F-18 FDG was injected intravenously. Full-ring PET imaging was performed from the skull base to thigh after the radiotracer. CT data was obtained and used for attenuation correction and anatomic localization.  FASTING BLOOD GLUCOSE:  Value: 100 mg/dl  COMPARISON:  12/31/2013  FINDINGS: NECK  Hypermetabolic left level 2 B lymph node has decreased in size  and hypermetabolic activity since previous study. This node is no longer measurable by CT and has SUV max of 2.6 compared to 3.1 previously. No other hypermetabolic cervical lymph nodes identified.  CHEST  Left internal mammary hypermetabolic lymphadenopathy has nearly completely  resolved, with SUV max currently measuring 1.4 compared to 6.8 previously. Previously seen hypermetabolic lymphadenopathy in the subcarinal region and both axillary regions has resolved. Other previously seen hypermetabolic lymphadenopathy along the anterior aspect of the right ventricle in the inferior anterior mediastinum has also resolved.  No suspicious pulmonary nodules seen by CT. Previously seen small left pleural effusion has also resolved.  ABDOMEN/PELVIS  No abnormal hypermetabolic activity within the liver, pancreas, adrenal glands, or spleen.  Previously seen bulky hypermetabolic lymphadenopathy centered in the porta hepatis and right upper quadrant the abdomen has resolved. Previously seen hypermetabolic lymph nodes in the abdominal retroperitoneum, mesentery, bilateral external iliac chains, and bilateral inguinal regions have also resolved.  SKELETON  No focal hypermetabolic activity to suggest skeletal metastasis.  IMPRESSION: Near complete metabolic response to therapy within the neck, chest, abdomen, and pelvis, as described above. No new or progressive disease identified.   Electronically Signed   By: Earle Gell M.D.   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 I estimate that she will need a minimum of 4 cycles of treatment before we can stop. I recommend drastric dose adjustment to bendamustine at only one third of the calculated dose for future treatment due to recent multi-organ failure. She is in agreement to proceed. I plan to see her on a weekly basis after her next cycle of  treatment for aggressive supportive care.  Anemia in neoplastic disease This is likely anemia of chronic disease. The patient denies recent history of bleeding such as epistaxis, hematuria or hematochezia. She is asymptomatic from the anemia. We will observe for now.  She does not require transfusion now.    No orders of the defined types were placed in this encounter.   All questions were answered. The patient knows to call the clinic with any problems, questions or concerns. No barriers to learning was detected. I spent 30 minutes counseling the patient face to face. The total time spent in the appointment was 40 minutes and more than 50% was on counseling and review of test results     [facility], [place], MD 02/27/2014 11:54 AM

## 2014-02-27 NOTE — Telephone Encounter (Signed)
Per staff message and POF I have scheduled appts. Advised scheduler of appts. JMW  

## 2014-02-27 NOTE — Telephone Encounter (Signed)
Pt confirmed labs/ov per 12/30 POF, gave pt AVS.... KJ, sent msg to add chemo °

## 2014-02-27 NOTE — Assessment & Plan Note (Signed)
This is likely anemia of chronic disease. The patient denies recent history of bleeding such as epistaxis, hematuria or hematochezia. She is asymptomatic from the anemia. We will observe for now.  She does not require transfusion now.   

## 2014-02-28 DIAGNOSIS — E119 Type 2 diabetes mellitus without complications: Secondary | ICD-10-CM | POA: Diagnosis not present

## 2014-02-28 DIAGNOSIS — Z961 Presence of intraocular lens: Secondary | ICD-10-CM | POA: Diagnosis not present

## 2014-03-04 ENCOUNTER — Other Ambulatory Visit: Payer: Self-pay

## 2014-03-04 ENCOUNTER — Telehealth: Payer: Self-pay | Admitting: *Deleted

## 2014-03-04 DIAGNOSIS — Z1231 Encounter for screening mammogram for malignant neoplasm of breast: Secondary | ICD-10-CM

## 2014-03-04 NOTE — Telephone Encounter (Signed)
Pt called to confirm her chemo appts this week.  Informed her of chemo scheduled for Wed and Thursday this week and also need to add on lab appt before chemo Wed.  Pt verbalized understanding and will come early for lab appt on Wed. 1/6.

## 2014-03-05 ENCOUNTER — Telehealth: Payer: Self-pay | Admitting: Hematology and Oncology

## 2014-03-05 NOTE — Telephone Encounter (Signed)
per pof pt aware to come at 1pm for lab b4 tx

## 2014-03-06 ENCOUNTER — Other Ambulatory Visit: Payer: Self-pay | Admitting: Hematology and Oncology

## 2014-03-06 ENCOUNTER — Ambulatory Visit (HOSPITAL_BASED_OUTPATIENT_CLINIC_OR_DEPARTMENT_OTHER): Payer: Medicare Other

## 2014-03-06 ENCOUNTER — Other Ambulatory Visit (HOSPITAL_BASED_OUTPATIENT_CLINIC_OR_DEPARTMENT_OTHER): Payer: Medicare Other

## 2014-03-06 DIAGNOSIS — Z5111 Encounter for antineoplastic chemotherapy: Secondary | ICD-10-CM | POA: Diagnosis not present

## 2014-03-06 DIAGNOSIS — C8591 Non-Hodgkin lymphoma, unspecified, lymph nodes of head, face, and neck: Secondary | ICD-10-CM

## 2014-03-06 LAB — CBC WITH DIFFERENTIAL/PLATELET
BASO%: 0.8 % (ref 0.0–2.0)
BASOS ABS: 0.1 10*3/uL (ref 0.0–0.1)
EOS%: 1.2 % (ref 0.0–7.0)
Eosinophils Absolute: 0.1 10*3/uL (ref 0.0–0.5)
HEMATOCRIT: 28.9 % — AB (ref 34.8–46.6)
HGB: 9.3 g/dL — ABNORMAL LOW (ref 11.6–15.9)
LYMPH%: 16.6 % (ref 14.0–49.7)
MCH: 28.6 pg (ref 25.1–34.0)
MCHC: 32.1 g/dL (ref 31.5–36.0)
MCV: 89.3 fL (ref 79.5–101.0)
MONO#: 1.2 10*3/uL — ABNORMAL HIGH (ref 0.1–0.9)
MONO%: 15.9 % — ABNORMAL HIGH (ref 0.0–14.0)
NEUT#: 5.1 10*3/uL (ref 1.5–6.5)
NEUT%: 65.5 % (ref 38.4–76.8)
PLATELETS: 215 10*3/uL (ref 145–400)
RBC: 3.24 10*6/uL — ABNORMAL LOW (ref 3.70–5.45)
RDW: 20.4 % — AB (ref 11.2–14.5)
WBC: 7.7 10*3/uL (ref 3.9–10.3)
lymph#: 1.3 10*3/uL (ref 0.9–3.3)

## 2014-03-06 LAB — COMPREHENSIVE METABOLIC PANEL (CC13)
ALK PHOS: 98 U/L (ref 40–150)
ALT: 15 U/L (ref 0–55)
ANION GAP: 8 meq/L (ref 3–11)
AST: 14 U/L (ref 5–34)
Albumin: 3.3 g/dL — ABNORMAL LOW (ref 3.5–5.0)
BILIRUBIN TOTAL: 0.48 mg/dL (ref 0.20–1.20)
BUN: 13.3 mg/dL (ref 7.0–26.0)
CO2: 26 meq/L (ref 22–29)
CREATININE: 0.7 mg/dL (ref 0.6–1.1)
Calcium: 9.3 mg/dL (ref 8.4–10.4)
Chloride: 107 mEq/L (ref 98–109)
EGFR: 86 mL/min/{1.73_m2} — AB (ref >90)
Glucose: 145 mg/dl — ABNORMAL HIGH (ref 70–140)
Potassium: 3.8 mEq/L (ref 3.5–5.1)
Sodium: 141 mEq/L (ref 136–145)
Total Protein: 5.9 g/dL — ABNORMAL LOW (ref 6.4–8.3)

## 2014-03-06 LAB — TECHNOLOGIST REVIEW

## 2014-03-06 MED ORDER — DEXAMETHASONE SODIUM PHOSPHATE 10 MG/ML IJ SOLN
10.0000 mg | Freq: Once | INTRAMUSCULAR | Status: AC
  Administered 2014-03-06: 10 mg via INTRAVENOUS

## 2014-03-06 MED ORDER — SODIUM CHLORIDE 0.9 % IJ SOLN
10.0000 mL | INTRAMUSCULAR | Status: DC | PRN
  Administered 2014-03-06: 10 mL
  Filled 2014-03-06: qty 10

## 2014-03-06 MED ORDER — SODIUM CHLORIDE 0.9 % IV SOLN
Freq: Once | INTRAVENOUS | Status: AC
  Administered 2014-03-06: 14:00:00 via INTRAVENOUS

## 2014-03-06 MED ORDER — ONDANSETRON 8 MG/NS 50 ML IVPB
INTRAVENOUS | Status: AC
  Filled 2014-03-06: qty 8

## 2014-03-06 MED ORDER — SODIUM CHLORIDE 0.9 % IV SOLN
30.0000 mg/m2 | Freq: Once | INTRAVENOUS | Status: AC
  Administered 2014-03-06: 54 mg via INTRAVENOUS
  Filled 2014-03-06: qty 0.6

## 2014-03-06 MED ORDER — DEXAMETHASONE SODIUM PHOSPHATE 10 MG/ML IJ SOLN
INTRAMUSCULAR | Status: AC
  Filled 2014-03-06: qty 1

## 2014-03-06 MED ORDER — ONDANSETRON 8 MG/50ML IVPB (CHCC)
8.0000 mg | Freq: Once | INTRAVENOUS | Status: AC
  Administered 2014-03-06: 8 mg via INTRAVENOUS

## 2014-03-06 MED ORDER — SODIUM CHLORIDE 0.9 % IV SOLN
30.0000 mg/m2 | Freq: Once | INTRAVENOUS | Status: DC
  Filled 2014-03-06: qty 0.6

## 2014-03-06 MED ORDER — HEPARIN SOD (PORK) LOCK FLUSH 100 UNIT/ML IV SOLN
500.0000 [IU] | Freq: Once | INTRAVENOUS | Status: AC | PRN
  Administered 2014-03-06: 500 [IU]
  Filled 2014-03-06: qty 5

## 2014-03-06 NOTE — Progress Notes (Signed)
Per Dr. Alvy Bimler and pharmacy we will give pt Treanda only today and give Rituxan and Treanda tomorrow 03/07/13. Pt verbalized understanding and is in agreement with this plan.

## 2014-03-06 NOTE — Patient Instructions (Signed)
[facility] Discharge Instructions for Patients Receiving Chemotherapy  Today you received the following chemotherapy agents: Treanda.  To help prevent nausea and vomiting after your treatment, we encourage you to take your nausea medication as prescribed.   If you develop nausea and vomiting that is not controlled by your nausea medication, call the clinic.   BELOW ARE SYMPTOMS THAT SHOULD BE REPORTED IMMEDIATELY:  *FEVER GREATER THAN 100.5 F  *CHILLS WITH OR WITHOUT FEVER  NAUSEA AND VOMITING THAT IS NOT CONTROLLED WITH YOUR NAUSEA MEDICATION  *UNUSUAL SHORTNESS OF BREATH  *UNUSUAL BRUISING OR BLEEDING  TENDERNESS IN MOUTH AND THROAT WITH OR WITHOUT PRESENCE OF ULCERS  *URINARY PROBLEMS  *BOWEL PROBLEMS  UNUSUAL RASH Items with * indicate a potential emergency and should be followed up as soon as possible.  Feel free to call the clinic you have any questions or concerns. The clinic phone number is [phone].    

## 2014-03-07 ENCOUNTER — Ambulatory Visit (HOSPITAL_BASED_OUTPATIENT_CLINIC_OR_DEPARTMENT_OTHER): Payer: Medicare Other

## 2014-03-07 DIAGNOSIS — Z5112 Encounter for antineoplastic immunotherapy: Secondary | ICD-10-CM

## 2014-03-07 DIAGNOSIS — C8591 Non-Hodgkin lymphoma, unspecified, lymph nodes of head, face, and neck: Secondary | ICD-10-CM | POA: Diagnosis not present

## 2014-03-07 DIAGNOSIS — Z5111 Encounter for antineoplastic chemotherapy: Secondary | ICD-10-CM | POA: Diagnosis not present

## 2014-03-07 MED ORDER — DEXAMETHASONE SODIUM PHOSPHATE 10 MG/ML IJ SOLN
10.0000 mg | Freq: Once | INTRAMUSCULAR | Status: AC
  Administered 2014-03-07: 10 mg via INTRAVENOUS

## 2014-03-07 MED ORDER — SODIUM CHLORIDE 0.9 % IV SOLN
30.0000 mg/m2 | Freq: Once | INTRAVENOUS | Status: AC
  Administered 2014-03-07: 54 mg via INTRAVENOUS
  Filled 2014-03-07: qty 0.6

## 2014-03-07 MED ORDER — ONDANSETRON 8 MG/50ML IVPB (CHCC)
8.0000 mg | Freq: Once | INTRAVENOUS | Status: AC
  Administered 2014-03-07: 8 mg via INTRAVENOUS

## 2014-03-07 MED ORDER — RITUXIMAB CHEMO INJECTION 500 MG/50ML
375.0000 mg/m2 | Freq: Once | INTRAVENOUS | Status: AC
  Administered 2014-03-07: 600 mg via INTRAVENOUS
  Filled 2014-03-07: qty 60

## 2014-03-07 MED ORDER — DIPHENHYDRAMINE HCL 25 MG PO CAPS
50.0000 mg | ORAL_CAPSULE | Freq: Once | ORAL | Status: AC
  Administered 2014-03-07: 50 mg via ORAL

## 2014-03-07 MED ORDER — SODIUM CHLORIDE 0.9 % IJ SOLN
10.0000 mL | INTRAMUSCULAR | Status: DC | PRN
  Administered 2014-03-07: 10 mL
  Filled 2014-03-07: qty 10

## 2014-03-07 MED ORDER — ONDANSETRON 8 MG/NS 50 ML IVPB
INTRAVENOUS | Status: AC
Start: 2014-03-07 — End: 2014-03-07
  Filled 2014-03-07: qty 8

## 2014-03-07 MED ORDER — HEPARIN SOD (PORK) LOCK FLUSH 100 UNIT/ML IV SOLN
500.0000 [IU] | Freq: Once | INTRAVENOUS | Status: AC | PRN
  Administered 2014-03-07: 500 [IU]
  Filled 2014-03-07: qty 5

## 2014-03-07 MED ORDER — ACETAMINOPHEN 325 MG PO TABS
650.0000 mg | ORAL_TABLET | Freq: Once | ORAL | Status: AC
  Administered 2014-03-07: 650 mg via ORAL

## 2014-03-07 MED ORDER — SODIUM CHLORIDE 0.9 % IV SOLN
Freq: Once | INTRAVENOUS | Status: AC
  Administered 2014-03-07: 08:00:00 via INTRAVENOUS

## 2014-03-07 MED ORDER — DEXAMETHASONE SODIUM PHOSPHATE 10 MG/ML IJ SOLN
INTRAMUSCULAR | Status: AC
  Filled 2014-03-07: qty 1

## 2014-03-07 MED ORDER — ACETAMINOPHEN 325 MG PO TABS
ORAL_TABLET | ORAL | Status: AC
  Filled 2014-03-07: qty 2

## 2014-03-07 MED ORDER — DIPHENHYDRAMINE HCL 25 MG PO CAPS
ORAL_CAPSULE | ORAL | Status: AC
  Filled 2014-03-07: qty 2

## 2014-03-07 NOTE — Patient Instructions (Signed)
[facility] Discharge Instructions for Patients Receiving Chemotherapy  Today you received the following chemotherapy agents Rituxan and Gean Quint  To help prevent nausea and vomiting after your treatment, we encourage you to take your nausea medication as prescribed.   If you develop nausea and vomiting that is not controlled by your nausea medication, call the clinic.   BELOW ARE SYMPTOMS THAT SHOULD BE REPORTED IMMEDIATELY:  *FEVER GREATER THAN 100.5 F  *CHILLS WITH OR WITHOUT FEVER  NAUSEA AND VOMITING THAT IS NOT CONTROLLED WITH YOUR NAUSEA MEDICATION  *UNUSUAL SHORTNESS OF BREATH  *UNUSUAL BRUISING OR BLEEDING  TENDERNESS IN MOUTH AND THROAT WITH OR WITHOUT PRESENCE OF ULCERS  *URINARY PROBLEMS  *BOWEL PROBLEMS  UNUSUAL RASH Items with * indicate a potential emergency and should be followed up as soon as possible.  Feel free to call the clinic you have any questions or concerns. The clinic phone number is [phone].

## 2014-03-08 ENCOUNTER — Telehealth: Payer: Self-pay | Admitting: Hematology and Oncology

## 2014-03-08 ENCOUNTER — Ambulatory Visit: Payer: Medicare Other

## 2014-03-08 ENCOUNTER — Telehealth: Payer: Self-pay | Admitting: *Deleted

## 2014-03-08 DIAGNOSIS — Z7901 Long term (current) use of anticoagulants: Secondary | ICD-10-CM | POA: Diagnosis not present

## 2014-03-08 DIAGNOSIS — E119 Type 2 diabetes mellitus without complications: Secondary | ICD-10-CM | POA: Diagnosis not present

## 2014-03-08 DIAGNOSIS — C8220 Follicular lymphoma grade III, unspecified, unspecified site: Secondary | ICD-10-CM | POA: Diagnosis not present

## 2014-03-08 NOTE — Telephone Encounter (Signed)
Lft msg for pt to pick up schedule with chemo added at next visit.... KJ

## 2014-03-08 NOTE — Telephone Encounter (Signed)
Per staff message and POF I have scheduled appts. Advised scheduler of appts. JMW  

## 2014-03-08 NOTE — Progress Notes (Signed)
Appt rescheduled to 03/09/14 due to time frame for Neulasta injection.   Pt was very understanding that her injection could not be given before 2:20 today. Could not come back this afternoon because of another appointment elsewhere

## 2014-03-08 NOTE — Telephone Encounter (Signed)
Pt came by to r/s inj due to ins will not cover until after the 24 hrs, added labs for 02/03 and sent msg to add chemo.... KJ

## 2014-03-09 ENCOUNTER — Ambulatory Visit (HOSPITAL_BASED_OUTPATIENT_CLINIC_OR_DEPARTMENT_OTHER): Payer: Medicare Other

## 2014-03-09 DIAGNOSIS — Z5189 Encounter for other specified aftercare: Secondary | ICD-10-CM | POA: Diagnosis not present

## 2014-03-09 DIAGNOSIS — C8591 Non-Hodgkin lymphoma, unspecified, lymph nodes of head, face, and neck: Secondary | ICD-10-CM | POA: Diagnosis not present

## 2014-03-09 MED ORDER — PEGFILGRASTIM INJECTION 6 MG/0.6ML ~~LOC~~
6.0000 mg | PREFILLED_SYRINGE | Freq: Once | SUBCUTANEOUS | Status: AC
  Administered 2014-03-09: 6 mg via SUBCUTANEOUS

## 2014-03-09 NOTE — Patient Instructions (Signed)
Pegfilgrastim injection What is this medicine? PEGFILGRASTIM (peg fil GRA stim) is a long-acting granulocyte colony-stimulating factor that stimulates the growth of neutrophils, a type of white blood cell important in the body's fight against infection. It is used to reduce the incidence of fever and infection in patients with certain types of cancer who are receiving chemotherapy that affects the bone marrow. This medicine may be used for other purposes; ask your health care provider or pharmacist if you have questions. COMMON BRAND NAME(S): Neulasta What should I tell my health care provider before I take this medicine? They need to know if you have any of these conditions: -latex allergy -ongoing radiation therapy -sickle cell disease -skin reactions to acrylic adhesives (On-Body Injector only) -an unusual or allergic reaction to pegfilgrastim, filgrastim, other medicines, foods, dyes, or preservatives -pregnant or trying to get pregnant -breast-feeding How should I use this medicine? This medicine is for injection under the skin. If you get this medicine at home, you will be taught how to prepare and give the pre-filled syringe or how to use the On-body Injector. Refer to the patient Instructions for Use for detailed instructions. Use exactly as directed. Take your medicine at regular intervals. Do not take your medicine more often than directed. It is important that you put your used needles and syringes in a special sharps container. Do not put them in a trash can. If you do not have a sharps container, call your pharmacist or healthcare provider to get one. Talk to your pediatrician regarding the use of this medicine in children. Special care may be needed. Overdosage: If you think you have taken too much of this medicine contact a poison control center or emergency room at once. NOTE: This medicine is only for you. Do not share this medicine with others. What if I miss a dose? It is  important not to miss your dose. Call your doctor or health care professional if you miss your dose. If you miss a dose due to an On-body Injector failure or leakage, a new dose should be administered as soon as possible using a single prefilled syringe for manual use. What may interact with this medicine? Interactions have not been studied. Give your health care provider a list of all the medicines, herbs, non-prescription drugs, or dietary supplements you use. Also tell them if you smoke, drink alcohol, or use illegal drugs. Some items may interact with your medicine. This list may not describe all possible interactions. Give your health care provider a list of all the medicines, herbs, non-prescription drugs, or dietary supplements you use. Also tell them if you smoke, drink alcohol, or use illegal drugs. Some items may interact with your medicine. What should I watch for while using this medicine? You may need blood work done while you are taking this medicine. If you are going to need a MRI, CT scan, or other procedure, tell your doctor that you are using this medicine (On-Body Injector only). What side effects may I notice from receiving this medicine? Side effects that you should report to your doctor or health care professional as soon as possible: -allergic reactions like skin rash, itching or hives, swelling of the face, lips, or tongue -dizziness -fever -pain, redness, or irritation at site where injected -pinpoint red spots on the skin -shortness of breath or breathing problems -stomach or side pain, or pain at the shoulder -swelling -tiredness -trouble passing urine Side effects that usually do not require medical attention (report to your doctor   or health care professional if they continue or are bothersome): -bone pain -muscle pain This list may not describe all possible side effects. Call your doctor for medical advice about side effects. You may report side effects to FDA at  1-800-FDA-1088. Where should I keep my medicine? Keep out of the reach of children. Store pre-filled syringes in a refrigerator between 2 and 8 degrees C (36 and 46 degrees F). Do not freeze. Keep in carton to protect from light. Throw away this medicine if it is left out of the refrigerator for more than 48 hours. Throw away any unused medicine after the expiration date. NOTE: This sheet is a summary. It may not cover all possible information. If you have questions about this medicine, talk to your doctor, pharmacist, or health care provider.  2015, Elsevier/Gold Standard. (2013-05-17 16:14:05)  

## 2014-03-11 DIAGNOSIS — Z7901 Long term (current) use of anticoagulants: Secondary | ICD-10-CM | POA: Diagnosis not present

## 2014-03-13 DIAGNOSIS — C8220 Follicular lymphoma grade III, unspecified, unspecified site: Secondary | ICD-10-CM | POA: Diagnosis not present

## 2014-03-13 DIAGNOSIS — E119 Type 2 diabetes mellitus without complications: Secondary | ICD-10-CM | POA: Diagnosis not present

## 2014-03-14 ENCOUNTER — Other Ambulatory Visit (HOSPITAL_BASED_OUTPATIENT_CLINIC_OR_DEPARTMENT_OTHER): Payer: Medicare Other

## 2014-03-14 ENCOUNTER — Ambulatory Visit (HOSPITAL_BASED_OUTPATIENT_CLINIC_OR_DEPARTMENT_OTHER): Payer: Medicare Other

## 2014-03-14 ENCOUNTER — Telehealth: Payer: Self-pay | Admitting: Hematology and Oncology

## 2014-03-14 ENCOUNTER — Encounter: Payer: Self-pay | Admitting: Hematology and Oncology

## 2014-03-14 ENCOUNTER — Ambulatory Visit (HOSPITAL_BASED_OUTPATIENT_CLINIC_OR_DEPARTMENT_OTHER): Payer: Medicare Other | Admitting: Hematology and Oncology

## 2014-03-14 VITALS — BP 99/70 | HR 120 | Temp 98.1°F | Resp 19 | Ht 66.0 in | Wt 131.0 lb

## 2014-03-14 DIAGNOSIS — C8591 Non-Hodgkin lymphoma, unspecified, lymph nodes of head, face, and neck: Secondary | ICD-10-CM

## 2014-03-14 DIAGNOSIS — I482 Chronic atrial fibrillation, unspecified: Secondary | ICD-10-CM

## 2014-03-14 DIAGNOSIS — R197 Diarrhea, unspecified: Secondary | ICD-10-CM | POA: Diagnosis not present

## 2014-03-14 DIAGNOSIS — D630 Anemia in neoplastic disease: Secondary | ICD-10-CM | POA: Diagnosis not present

## 2014-03-14 DIAGNOSIS — E860 Dehydration: Secondary | ICD-10-CM

## 2014-03-14 DIAGNOSIS — D72829 Elevated white blood cell count, unspecified: Secondary | ICD-10-CM

## 2014-03-14 DIAGNOSIS — N178 Other acute kidney failure: Secondary | ICD-10-CM

## 2014-03-14 LAB — CBC WITH DIFFERENTIAL/PLATELET
BASO%: 0.3 % (ref 0.0–2.0)
Basophils Absolute: 0.1 10*3/uL (ref 0.0–0.1)
EOS%: 0.8 % (ref 0.0–7.0)
Eosinophils Absolute: 0.2 10*3/uL (ref 0.0–0.5)
HCT: 29.9 % — ABNORMAL LOW (ref 34.8–46.6)
HGB: 9.6 g/dL — ABNORMAL LOW (ref 11.6–15.9)
LYMPH%: 3.8 % — ABNORMAL LOW (ref 14.0–49.7)
MCH: 28.9 pg (ref 25.1–34.0)
MCHC: 32 g/dL (ref 31.5–36.0)
MCV: 90.3 fL (ref 79.5–101.0)
MONO#: 2.1 10*3/uL — ABNORMAL HIGH (ref 0.1–0.9)
MONO%: 10.6 % (ref 0.0–14.0)
NEUT#: 17.1 10*3/uL — ABNORMAL HIGH (ref 1.5–6.5)
NEUT%: 84.5 % — ABNORMAL HIGH (ref 38.4–76.8)
Platelets: 174 10*3/uL (ref 145–400)
RBC: 3.31 10*6/uL — ABNORMAL LOW (ref 3.70–5.45)
RDW: 21.6 % — ABNORMAL HIGH (ref 11.2–14.5)
WBC: 20.2 10*3/uL — ABNORMAL HIGH (ref 3.9–10.3)
lymph#: 0.8 10*3/uL — ABNORMAL LOW (ref 0.9–3.3)

## 2014-03-14 LAB — COMPREHENSIVE METABOLIC PANEL (CC13)
ALT: 15 U/L (ref 0–55)
ANION GAP: 11 meq/L (ref 3–11)
AST: 15 U/L (ref 5–34)
Albumin: 3.5 g/dL (ref 3.5–5.0)
Alkaline Phosphatase: 145 U/L (ref 40–150)
BUN: 10.3 mg/dL (ref 7.0–26.0)
CALCIUM: 8.8 mg/dL (ref 8.4–10.4)
CO2: 27 meq/L (ref 22–29)
Chloride: 100 mEq/L (ref 98–109)
Creatinine: 0.8 mg/dL (ref 0.6–1.1)
EGFR: 72 mL/min/{1.73_m2} — AB (ref >90)
GLUCOSE: 152 mg/dL — AB (ref 70–140)
Potassium: 4.1 mEq/L (ref 3.5–5.1)
Sodium: 138 mEq/L (ref 136–145)
Total Bilirubin: 0.53 mg/dL (ref 0.20–1.20)
Total Protein: 6 g/dL — ABNORMAL LOW (ref 6.4–8.3)

## 2014-03-14 MED ORDER — HEPARIN SOD (PORK) LOCK FLUSH 100 UNIT/ML IV SOLN
500.0000 [IU] | Freq: Once | INTRAVENOUS | Status: AC
  Administered 2014-03-14: 500 [IU] via INTRAVENOUS
  Filled 2014-03-14: qty 5

## 2014-03-14 MED ORDER — SODIUM CHLORIDE 0.9 % IV SOLN
Freq: Once | INTRAVENOUS | Status: AC
Start: 2014-03-14 — End: 2014-03-14
  Administered 2014-03-14: 10:00:00 via INTRAVENOUS

## 2014-03-14 MED ORDER — DIPHENOXYLATE-ATROPINE 2.5-0.025 MG PO TABS: 1.0000 | ORAL_TABLET | Freq: Four times a day (QID) | ORAL | Status: DC | PRN

## 2014-03-14 MED ORDER — SODIUM CHLORIDE 0.9 % IJ SOLN
10.0000 mL | INTRAMUSCULAR | Status: DC | PRN
  Administered 2014-03-14: 10 mL via INTRAVENOUS
  Filled 2014-03-14: qty 10

## 2014-03-14 NOTE — Assessment & Plan Note (Signed)
This could be related to side effects of chemotherapy. I recommend Lomotil. The diarrhea has caused significant dehydration and hypotension. I recommend IV fluids daily

## 2014-03-14 NOTE — Progress Notes (Signed)
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   01/03/2014 Bone Marrow Biopsy Accession: [id number] Bone marrow biopsy is negative   01/04/2014 Imaging ECHO showed normal EF   01/04/2014 Procedure She has placement of port   01/09/2014 -  Chemotherapy She was started on bendamustine with rituximab   01/18/2014 - 02/01/2014 Hospital Admission She was admitted to the hospital from Escherichia coli sepsis with multiorgan failure and brief episodes of intubation. She was discharged to skilled nursing facility   02/26/2014 Imaging PET/CT scan showed near complete remission   02/27/2014 Adverse Reaction Cycle 2 of treatment was resumed with drastic dose adjustment to bendamustine due to recent multi-organ failure    INTERVAL HISTORY: Please see below for problem oriented charting. She is seen as part of her weekly supportive care visit. She complained of mild dizziness and diarrhea. She denies any recent mild sores nausea or vomiting. Denies any chest pain or shortness of breath.  REVIEW OF SYSTEMS:   Constitutional: Denies fevers, chills or abnormal  weight loss Eyes: Denies blurriness of vision Ears, nose, mouth, throat, and face: Denies mucositis or sore throat Respiratory: Denies cough, dyspnea or wheezes Cardiovascular: Denies palpitation, chest discomfort or lower extremity swelling Skin: Denies abnormal skin rashes Lymphatics: Denies new lymphadenopathy or easy bruising Neurological:Denies numbness, tingling or new weaknesses Behavioral/Psych: Mood is stable, no new changes  All other systems were reviewed with the patient and are negative.  I have reviewed the past medical history, past surgical history, social history and family history with the patient and they are unchanged from previous note.  ALLERGIES:  is allergic to morphine and related; demerol; meperidine hcl; multaq; oysters; penicillins; and sulfa drugs cross reactors.  MEDICATIONS:  Current Outpatient Prescriptions  Medication Sig Dispense Refill  . acetaminophen (TYLENOL) 500 MG tablet Take 1,000 mg by mouth every 6 (six) hours as needed for mild pain.     Marland Kitchen acyclovir (ZOVIRAX) 400 MG tablet Take 1 tablet (400 mg total) by mouth daily. 30 tablet 3  . albuterol (PROAIR HFA) 108 (90 BASE) MCG/ACT inhaler Inhale 2 puffs into the lungs every 6 (six) hours as needed for wheezing or shortness of breath.    . Cholecalciferol (VITAMIN D) 2000 UNITS tablet Take 2,000 Units by mouth daily with lunch.    . conjugated estrogens (PREMARIN) vaginal cream Place 1 Applicatorful vaginally 3 (three) times a week. Monday, Wednesday, and Friday nights    . Dentifrices (BIOTENE DRY MOUTH CARE DT) Place onto teeth.    . dicyclomine (BENTYL) 10 MG capsule Take 10 mg by mouth daily as needed (ibs flare up).     Marland Kitchen  digoxin (LANOXIN) 0.125 MG tablet Take 125 mcg by mouth daily.     Marland Kitchen diltiazem (CARDIZEM CD) 120 MG 24 hr capsule Take 120 mg by mouth daily.     . diphenoxylate-atropine (LOMOTIL) 2.5-0.025 MG per tablet Take 1 tablet by mouth 4 (four) times daily as needed for diarrhea or loose  stools. 60 tablet 0  . ezetimibe (ZETIA) 10 MG tablet Take 10 mg by mouth daily.     . Fluticasone-Salmeterol (ADVAIR) 250-50 MCG/DOSE AEPB Inhale 1 puff into the lungs every 12 (twelve) hours.     . hydrochlorothiazide (HYDRODIURIL) 25 MG tablet Take 25 mg by mouth daily as needed (for ankle swelling).     Marland Kitchen levothyroxine (SYNTHROID, LEVOTHROID) 125 MCG tablet Take 250 mcg by mouth daily before breakfast.     . lidocaine-prilocaine (EMLA) cream Apply 1 application topically as needed. Apply to [facility] a Cath site at least one hour prior to needle stick as needed. 30 g 3  . Magnesium Oxide 500 MG TABS Take 500 mg by mouth at bedtime.    . metFORMIN (GLUCOPHAGE-XR) 500 MG 24 hr tablet Take 500 mg by mouth 2 (two) times daily with a meal.    . montelukast (SINGULAIR) 10 MG tablet Take 10 mg by mouth daily.    Marland Kitchen omega-3 acid ethyl esters (LOVAZA) 1 G capsule Take 1 g by mouth 2 (two) times daily.    . ondansetron (ZOFRAN) 8 MG tablet Take 1 tablet (8 mg total) by mouth every 8 (eight) hours as needed. 30 tablet 1  . potassium chloride SA (K-DUR,KLOR-CON) 20 MEQ tablet Take 1 tablet (20 mEq total) by mouth daily. 30 tablet 5  . pravastatin (PRAVACHOL) 40 MG tablet Take 20 mg by mouth See admin instructions. Take 1/2 tablet (20 mg) on Monday, Tuesday, Wednesday, and Thursday    . prochlorperazine (COMPAZINE) 10 MG tablet Take 1 tablet (10 mg total) by mouth every 6 (six) hours as needed (Nausea or vomiting). 30 tablet 1  . traMADol-acetaminophen (ULTRACET) 37.5-325 MG per tablet Take 1 tablet by mouth every 6 (six) hours as needed. 30 tablet 0  . triamterene-hydrochlorothiazide (MAXZIDE-25) 37.5-25 MG per tablet Take 1 tablet by mouth every morning.    . warfarin (COUMADIN) 5 MG tablet Take 2.5-5 mg by mouth daily. Take 1/2 tablet (2.5 mg) on Wednesday and Saturday and take 1 tablet (5 mg) all other days    . Alum & Mag Hydroxide-Simeth (MAGIC MOUTHWASH W/LIDOCAINE) SOLN Take 5 mLs by mouth 4 (four) times  daily as needed for mouth pain. (Patient not taking: Reported on 02/13/2014)  0   No current facility-administered medications for this visit.    PHYSICAL EXAMINATION: ECOG PERFORMANCE STATUS: 1 - Symptomatic but completely ambulatory  Filed Vitals:   03/14/14 0849  BP:   Pulse: 120  Temp:   Resp:    Filed Weights   03/14/14 0848  Weight: 131 lb (59.421 kg)    GENERAL:alert, no distress and comfortable SKIN: skin color, texture, turgor are normal, no rashes or significant lesions EYES: normal, Conjunctiva are pink and non-injected, sclera clear OROPHARYNX:no exudate, no erythema and lips, buccal mucosa, and tongue normal  NECK: supple, thyroid normal size, non-tender, without nodularity LYMPH:  no palpable lymphadenopathy in the cervical, axillary or inguinal LUNGS: clear to auscultation and percussion with normal breathing effort HEART: She has irregular heart rhythm. No murmurs, leg edema ABDOMEN:abdomen soft, non-tender and normal bowel sounds Musculoskeletal:no cyanosis of digits and no clubbing  NEURO: alert &  oriented x 3 with fluent speech, no focal motor/sensory deficits  LABORATORY DATA:  I have reviewed the data as listed    Component Value Date/Time   NA 138 03/14/2014 0832   NA 140 01/31/2014 0600   K 4.1 03/14/2014 0832   K 3.6* 01/31/2014 0600   CL 101 01/31/2014 0600   CO2 27 03/14/2014 0832   CO2 29 01/31/2014 0600   GLUCOSE 152* 03/14/2014 0832   GLUCOSE 106* 01/31/2014 0600   BUN 10.3 03/14/2014 0832   BUN 12 01/31/2014 0600   CREATININE 0.8 03/14/2014 0832   CREATININE 0.61 01/31/2014 0600   CALCIUM 8.8 03/14/2014 0832   CALCIUM 8.0* 01/31/2014 0600   PROT 6.0* 03/14/2014 0832   PROT 5.2* 01/27/2014 0400   ALBUMIN 3.5 03/14/2014 0832   ALBUMIN 2.5* 01/27/2014 0400   AST 15 03/14/2014 0832   AST 15 01/27/2014 0400   ALT 15 03/14/2014 0832   ALT 15 01/27/2014 0400   ALKPHOS 145 03/14/2014 0832   ALKPHOS 67 01/27/2014 0400   BILITOT 0.53  03/14/2014 0832   BILITOT 0.4 01/27/2014 0400   GFRNONAA 87* 01/31/2014 0600   GFRAA >90 01/31/2014 0600    No results found for: SPEP, UPEP  Lab Results  Component Value Date   WBC 20.2* 03/14/2014   NEUTROABS 17.1* 03/14/2014   HGB 9.6* 03/14/2014   HCT 29.9* 03/14/2014   MCV 90.3 03/14/2014   PLT 174 03/14/2014      Chemistry      Component Value Date/Time   NA 138 03/14/2014 0832   NA 140 01/31/2014 0600   K 4.1 03/14/2014 0832   K 3.6* 01/31/2014 0600   CL 101 01/31/2014 0600   CO2 27 03/14/2014 0832   CO2 29 01/31/2014 0600   BUN 10.3 03/14/2014 0832   BUN 12 01/31/2014 0600   CREATININE 0.8 03/14/2014 0832   CREATININE 0.61 01/31/2014 0600      Component Value Date/Time   CALCIUM 8.8 03/14/2014 0832   CALCIUM 8.0* 01/31/2014 0600   ALKPHOS 145 03/14/2014 0832   ALKPHOS 67 01/27/2014 0400   AST 15 03/14/2014 0832   AST 15 01/27/2014 0400   ALT 15 03/14/2014 0832   ALT 15 01/27/2014 0400   BILITOT 0.53 03/14/2014 0832   BILITOT 0.4 01/27/2014 0400      ASSESSMENT & PLAN:  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 I will continue aggressive supportive care and would consider discontinue further treatment beyond this.   Anemia in neoplastic disease This is likely anemia of chronic disease. The patient denies recent history of bleeding such as epistaxis, hematuria or hematochezia. She is asymptomatic from the anemia. We will observe for now.  She does not require transfusion now.    Diarrhea This could be related to side effects of chemotherapy. I recommend Lomotil. The diarrhea has caused significant dehydration and hypotension. I recommend IV fluids daily   Atrial fibrillation She has atrial fibrillation with rapid response, likely due to recent diarrhea and dehydration. I recommend she continue her current medication and I will  like to give her IV fluids daily   Leukocytosis This is related to recent G-CSF injection. Clinically, the patient does not seem to be septic   Dehydration Clinically, she appeared dehydrated. The dehydration have caused hypotension and tachycardia. This is due to diarrhea. I will recommend IV fluids  daily for the patient.    Orders Placed This Encounter  Procedures  . Hold Tube, Blood Bank    Standing Status: Standing     Number of Occurrences: 9     Standing Expiration Date: 03/15/2015   All questions were answered. The patient knows to call the clinic with any problems, questions or concerns. No barriers to learning was detected. I spent 30 minutes counseling the patient face to face. The total time spent in the appointment was 40 minutes and more than 50% was on counseling and review of test results     [place], [place], MD 03/14/2014 3:02 PM

## 2014-03-14 NOTE — Telephone Encounter (Signed)
Gave avs & cal for Jan. Sent mess for Fluids.

## 2014-03-14 NOTE — Assessment & Plan Note (Signed)
She has atrial fibrillation with rapid response, likely due to recent diarrhea and dehydration. I recommend she continue her current medication and I will like to give her IV fluids daily

## 2014-03-14 NOTE — Assessment & Plan Note (Signed)
Clinically, she appeared dehydrated. The dehydration have caused hypotension and tachycardia. This is due to diarrhea. I will recommend IV fluids daily for the patient.

## 2014-03-14 NOTE — Assessment & Plan Note (Signed)
This is related to recent G-CSF injection. Clinically, the patient does not seem to be septic

## 2014-03-14 NOTE — Assessment & Plan Note (Signed)
This is likely anemia of chronic disease. The patient denies recent history of bleeding such as epistaxis, hematuria or hematochezia. She is asymptomatic from the anemia. We will observe for now.  She does not require transfusion now.   

## 2014-03-14 NOTE — Assessment & Plan Note (Signed)
She has significant dosage modification for cycle 2 and despite this is she ran into a lot of problem. I will continue aggressive supportive care and would consider discontinue further treatment beyond this.

## 2014-03-14 NOTE — Patient Instructions (Signed)
Dehydration, Adult Dehydration is when you lose more fluids from the body than you take in. Vital organs like the kidneys, brain, and heart cannot function without a proper amount of fluids and salt. Any loss of fluids from the body can cause dehydration.  CAUSES   Vomiting.  Diarrhea.  Excessive sweating.  Excessive urine output.  Fever. SYMPTOMS  Mild dehydration  Thirst.  Dry lips.  Slightly dry mouth. Moderate dehydration  Very dry mouth.  Sunken eyes.  Skin does not bounce back quickly when lightly pinched and released.  Dark urine and decreased urine production.  Decreased tear production.  Headache. Severe dehydration  Very dry mouth.  Extreme thirst.  Rapid, weak pulse (more than 100 beats per minute at rest).  Cold hands and feet.  Not able to sweat in spite of heat and temperature.  Rapid breathing.  Blue lips.  Confusion and lethargy.  Difficulty being awakened.  Minimal urine production.  No tears. DIAGNOSIS  Your caregiver will diagnose dehydration based on your symptoms and your exam. Blood and urine tests will help confirm the diagnosis. The diagnostic evaluation should also identify the cause of dehydration. TREATMENT  Treatment of mild or moderate dehydration can often be done at home by increasing the amount of fluids that you drink. It is best to drink small amounts of fluid more often. Drinking too much at one time can make vomiting worse. Refer to the home care instructions below. Severe dehydration needs to be treated at the hospital where you will probably be given intravenous (IV) fluids that contain water and electrolytes. HOME CARE INSTRUCTIONS   Ask your caregiver about specific rehydration instructions.  Drink enough fluids to keep your urine clear or pale yellow.  Drink small amounts frequently if you have nausea and vomiting.  Eat as you normally do.  Avoid:  Foods or drinks high in sugar.  Carbonated  drinks.  Juice.  Extremely hot or cold fluids.  Drinks with caffeine.  Fatty, greasy foods.  Alcohol.  Tobacco.  Overeating.  Gelatin desserts.  Wash your hands well to avoid spreading bacteria and viruses.  Only take over-the-counter or prescription medicines for pain, discomfort, or fever as directed by your caregiver.  Ask your caregiver if you should continue all prescribed and over-the-counter medicines.  Keep all follow-up appointments with your caregiver. SEEK MEDICAL CARE IF:  You have abdominal pain and it increases or stays in one area (localizes).  You have a rash, stiff neck, or severe headache.  You are irritable, sleepy, or difficult to awaken.  You are weak, dizzy, or extremely thirsty. SEEK IMMEDIATE MEDICAL CARE IF:   You are unable to keep fluids down or you get worse despite treatment.  You have frequent episodes of vomiting or diarrhea.  You have blood or green matter (bile) in your vomit.  You have blood in your stool or your stool looks black and tarry.  You have not urinated in 6 to 8 hours, or you have only urinated a small amount of very dark urine.  You have a fever.  You faint. MAKE SURE YOU:   Understand these instructions.  Will watch your condition.  Will get help right away if you are not doing well or get worse. Document Released: 02/15/2005 Document Revised: 05/10/2011 Document Reviewed: 10/05/2010 Exit[employer] Patient Information 2015 [employer]. This information is not intended to replace advice given to you by your health care provider. Make sure you discuss any questions you have with your health care   provider.  

## 2014-03-15 ENCOUNTER — Ambulatory Visit (HOSPITAL_BASED_OUTPATIENT_CLINIC_OR_DEPARTMENT_OTHER): Payer: Medicare Other

## 2014-03-15 ENCOUNTER — Telehealth: Payer: Self-pay | Admitting: *Deleted

## 2014-03-15 ENCOUNTER — Telehealth: Payer: Self-pay | Admitting: Hematology and Oncology

## 2014-03-15 DIAGNOSIS — N12 Tubulo-interstitial nephritis, not specified as acute or chronic: Secondary | ICD-10-CM | POA: Diagnosis not present

## 2014-03-15 DIAGNOSIS — R197 Diarrhea, unspecified: Secondary | ICD-10-CM | POA: Diagnosis not present

## 2014-03-15 DIAGNOSIS — C8591 Non-Hodgkin lymphoma, unspecified, lymph nodes of head, face, and neck: Secondary | ICD-10-CM

## 2014-03-15 DIAGNOSIS — N178 Other acute kidney failure: Secondary | ICD-10-CM

## 2014-03-15 MED ORDER — HEPARIN SOD (PORK) LOCK FLUSH 100 UNIT/ML IV SOLN
500.0000 [IU] | Freq: Once | INTRAVENOUS | Status: AC | PRN
  Administered 2014-03-15: 500 [IU]
  Filled 2014-03-15: qty 5

## 2014-03-15 MED ORDER — SODIUM CHLORIDE 0.9 % IJ SOLN
10.0000 mL | INTRAMUSCULAR | Status: DC | PRN
  Administered 2014-03-15: 10 mL
  Filled 2014-03-15: qty 10

## 2014-03-15 MED ORDER — SODIUM CHLORIDE 0.9 % IV SOLN
Freq: Once | INTRAVENOUS | Status: AC
  Administered 2014-03-15: 13:00:00 via INTRAVENOUS

## 2014-03-15 NOTE — Telephone Encounter (Signed)
Per staff message and POF I have scheduled appts. Advised scheduler of appts and no available for appt on Monday. JMW

## 2014-03-15 NOTE — Progress Notes (Signed)
Pt states she does not need nausea medication today.  1530: Pt reports feeling "woozy" after standing up for discharge home. Vital signs remain stable at 125/47 with HR in the 70s.  Pt sat back down and stood back up slowly and states she felt better. Able to ambulate to the bathroom with some assistance.  States she feels normal and denies any symptoms of light-headedness or dizziness.  Pt escorted to vehicle in wheelchair, husband driving her home.  Pt again reports that she feels fine and stable walking to car.  Pt instructed to call if she has further symptoms of dizziness at home, will return tomorrow 1/16 for more IVF as scheduled.

## 2014-03-15 NOTE — Patient Instructions (Signed)
Dehydration, Adult Dehydration is when you lose more fluids from the body than you take in. Vital organs like the kidneys, brain, and heart cannot function without a proper amount of fluids and salt. Any loss of fluids from the body can cause dehydration.  CAUSES   Vomiting.  Diarrhea.  Excessive sweating.  Excessive urine output.  Fever. SYMPTOMS  Mild dehydration  Thirst.  Dry lips.  Slightly dry mouth. Moderate dehydration  Very dry mouth.  Sunken eyes.  Skin does not bounce back quickly when lightly pinched and released.  Dark urine and decreased urine production.  Decreased tear production.  Headache. Severe dehydration  Very dry mouth.  Extreme thirst.  Rapid, weak pulse (more than 100 beats per minute at rest).  Cold hands and feet.  Not able to sweat in spite of heat and temperature.  Rapid breathing.  Blue lips.  Confusion and lethargy.  Difficulty being awakened.  Minimal urine production.  No tears. DIAGNOSIS  Your caregiver will diagnose dehydration based on your symptoms and your exam. Blood and urine tests will help confirm the diagnosis. The diagnostic evaluation should also identify the cause of dehydration. TREATMENT  Treatment of mild or moderate dehydration can often be done at home by increasing the amount of fluids that you drink. It is best to drink small amounts of fluid more often. Drinking too much at one time can make vomiting worse. Refer to the home care instructions below. Severe dehydration needs to be treated at the hospital where you will probably be given intravenous (IV) fluids that contain water and electrolytes. HOME CARE INSTRUCTIONS   Ask your caregiver about specific rehydration instructions.  Drink enough fluids to keep your urine clear or pale yellow.  Drink small amounts frequently if you have nausea and vomiting.  Eat as you normally do.  Avoid:  Foods or drinks high in sugar.  Carbonated  drinks.  Juice.  Extremely hot or cold fluids.  Drinks with caffeine.  Fatty, greasy foods.  Alcohol.  Tobacco.  Overeating.  Gelatin desserts.  Wash your hands well to avoid spreading bacteria and viruses.  Only take over-the-counter or prescription medicines for pain, discomfort, or fever as directed by your caregiver.  Ask your caregiver if you should continue all prescribed and over-the-counter medicines.  Keep all follow-up appointments with your caregiver. SEEK MEDICAL CARE IF:  You have abdominal pain and it increases or stays in one area (localizes).  You have a rash, stiff neck, or severe headache.  You are irritable, sleepy, or difficult to awaken.  You are weak, dizzy, or extremely thirsty. SEEK IMMEDIATE MEDICAL CARE IF:   You are unable to keep fluids down or you get worse despite treatment.  You have frequent episodes of vomiting or diarrhea.  You have blood or green matter (bile) in your vomit.  You have blood in your stool or your stool looks black and tarry.  You have not urinated in 6 to 8 hours, or you have only urinated a small amount of very dark urine.  You have a fever.  You faint. MAKE SURE YOU:   Understand these instructions.  Will watch your condition.  Will get help right away if you are not doing well or get worse. Document Released: 02/15/2005 Document Revised: 05/10/2011 Document Reviewed: 10/05/2010 Exit[employer] Patient Information 2015 [employer]. This information is not intended to replace advice given to you by your health care provider. Make sure you discuss any questions you have with your health care   provider.  

## 2014-03-15 NOTE — Telephone Encounter (Signed)
LM to confirm appt  °

## 2014-03-16 ENCOUNTER — Ambulatory Visit (HOSPITAL_BASED_OUTPATIENT_CLINIC_OR_DEPARTMENT_OTHER): Payer: Medicare Other

## 2014-03-16 DIAGNOSIS — E860 Dehydration: Secondary | ICD-10-CM | POA: Diagnosis not present

## 2014-03-16 DIAGNOSIS — R197 Diarrhea, unspecified: Secondary | ICD-10-CM

## 2014-03-16 DIAGNOSIS — N178 Other acute kidney failure: Secondary | ICD-10-CM

## 2014-03-16 DIAGNOSIS — C8591 Non-Hodgkin lymphoma, unspecified, lymph nodes of head, face, and neck: Secondary | ICD-10-CM

## 2014-03-16 MED ORDER — SODIUM CHLORIDE 0.9 % IV SOLN
Freq: Once | INTRAVENOUS | Status: AC
  Administered 2014-03-16: 09:00:00 via INTRAVENOUS

## 2014-03-16 MED ORDER — HEPARIN SOD (PORK) LOCK FLUSH 100 UNIT/ML IV SOLN
500.0000 [IU] | Freq: Once | INTRAVENOUS | Status: AC | PRN
  Administered 2014-03-16: 500 [IU]
  Filled 2014-03-16: qty 5

## 2014-03-16 MED ORDER — SODIUM CHLORIDE 0.9 % IJ SOLN
10.0000 mL | INTRAMUSCULAR | Status: DC | PRN
  Administered 2014-03-16: 10 mL
  Filled 2014-03-16: qty 10

## 2014-03-16 NOTE — Progress Notes (Signed)
Patient complains of nausea without vomiting. Patient took her prescription of zofran while in clinic receiving IV fluids.

## 2014-03-16 NOTE — Patient Instructions (Signed)
Dehydration, Adult Dehydration is when you lose more fluids from the body than you take in. Vital organs like the kidneys, brain, and heart cannot function without a proper amount of fluids and salt. Any loss of fluids from the body can cause dehydration.  CAUSES   Vomiting.  Diarrhea.  Excessive sweating.  Excessive urine output.  Fever. SYMPTOMS  Mild dehydration  Thirst.  Dry lips.  Slightly dry mouth. Moderate dehydration  Very dry mouth.  Sunken eyes.  Skin does not bounce back quickly when lightly pinched and released.  Dark urine and decreased urine production.  Decreased tear production.  Headache. Severe dehydration  Very dry mouth.  Extreme thirst.  Rapid, weak pulse (more than 100 beats per minute at rest).  Cold hands and feet.  Not able to sweat in spite of heat and temperature.  Rapid breathing.  Blue lips.  Confusion and lethargy.  Difficulty being awakened.  Minimal urine production.  No tears. DIAGNOSIS  Your caregiver will diagnose dehydration based on your symptoms and your exam. Blood and urine tests will help confirm the diagnosis. The diagnostic evaluation should also identify the cause of dehydration. TREATMENT  Treatment of mild or moderate dehydration can often be done at home by increasing the amount of fluids that you drink. It is best to drink small amounts of fluid more often. Drinking too much at one time can make vomiting worse. Refer to the home care instructions below. Severe dehydration needs to be treated at the hospital where you will probably be given intravenous (IV) fluids that contain water and electrolytes. HOME CARE INSTRUCTIONS   Ask your caregiver about specific rehydration instructions.  Drink enough fluids to keep your urine clear or pale yellow.  Drink small amounts frequently if you have nausea and vomiting.  Eat as you normally do.  Avoid:  Foods or drinks high in sugar.  Carbonated  drinks.  Juice.  Extremely hot or cold fluids.  Drinks with caffeine.  Fatty, greasy foods.  Alcohol.  Tobacco.  Overeating.  Gelatin desserts.  Wash your hands well to avoid spreading bacteria and viruses.  Only take over-the-counter or prescription medicines for pain, discomfort, or fever as directed by your caregiver.  Ask your caregiver if you should continue all prescribed and over-the-counter medicines.  Keep all follow-up appointments with your caregiver. SEEK MEDICAL CARE IF:  You have abdominal pain and it increases or stays in one area (localizes).  You have a rash, stiff neck, or severe headache.  You are irritable, sleepy, or difficult to awaken.  You are weak, dizzy, or extremely thirsty. SEEK IMMEDIATE MEDICAL CARE IF:   You are unable to keep fluids down or you get worse despite treatment.  You have frequent episodes of vomiting or diarrhea.  You have blood or green matter (bile) in your vomit.  You have blood in your stool or your stool looks black and tarry.  You have not urinated in 6 to 8 hours, or you have only urinated a small amount of very dark urine.  You have a fever.  You faint. MAKE SURE YOU:   Understand these instructions.  Will watch your condition.  Will get help right away if you are not doing well or get worse. Document Released: 02/15/2005 Document Revised: 05/10/2011 Document Reviewed: 10/05/2010 Exit[employer] Patient Information 2015 [employer]. This information is not intended to replace advice given to you by your health care provider. Make sure you discuss any questions you have with your health care   provider.  

## 2014-03-18 ENCOUNTER — Telehealth: Payer: Self-pay | Admitting: Hematology and Oncology

## 2014-03-18 DIAGNOSIS — E119 Type 2 diabetes mellitus without complications: Secondary | ICD-10-CM | POA: Diagnosis not present

## 2014-03-18 DIAGNOSIS — C8220 Follicular lymphoma grade III, unspecified, unspecified site: Secondary | ICD-10-CM | POA: Diagnosis not present

## 2014-03-18 NOTE — Telephone Encounter (Signed)
pt called to cx appt done.....she says she feels well

## 2014-03-19 ENCOUNTER — Ambulatory Visit: Payer: Medicare Other

## 2014-03-19 ENCOUNTER — Encounter: Payer: Self-pay | Admitting: Internal Medicine

## 2014-03-20 ENCOUNTER — Ambulatory Visit: Payer: Medicare Other

## 2014-03-21 ENCOUNTER — Encounter: Payer: Self-pay | Admitting: Hematology and Oncology

## 2014-03-21 ENCOUNTER — Telehealth: Payer: Self-pay | Admitting: Hematology and Oncology

## 2014-03-21 ENCOUNTER — Other Ambulatory Visit (HOSPITAL_BASED_OUTPATIENT_CLINIC_OR_DEPARTMENT_OTHER): Payer: Medicare Other

## 2014-03-21 ENCOUNTER — Ambulatory Visit: Payer: Medicare Other

## 2014-03-21 ENCOUNTER — Telehealth: Payer: Self-pay | Admitting: *Deleted

## 2014-03-21 ENCOUNTER — Encounter: Payer: Self-pay | Admitting: *Deleted

## 2014-03-21 ENCOUNTER — Ambulatory Visit (HOSPITAL_BASED_OUTPATIENT_CLINIC_OR_DEPARTMENT_OTHER): Payer: Medicare Other | Admitting: Hematology and Oncology

## 2014-03-21 VITALS — BP 140/56 | HR 73 | Temp 98.0°F | Resp 18 | Ht 66.0 in | Wt 137.7 lb

## 2014-03-21 DIAGNOSIS — C8591 Non-Hodgkin lymphoma, unspecified, lymph nodes of head, face, and neck: Secondary | ICD-10-CM | POA: Diagnosis not present

## 2014-03-21 DIAGNOSIS — D630 Anemia in neoplastic disease: Secondary | ICD-10-CM | POA: Diagnosis not present

## 2014-03-21 DIAGNOSIS — I482 Chronic atrial fibrillation, unspecified: Secondary | ICD-10-CM

## 2014-03-21 DIAGNOSIS — R600 Localized edema: Secondary | ICD-10-CM | POA: Diagnosis not present

## 2014-03-21 DIAGNOSIS — Z7901 Long term (current) use of anticoagulants: Secondary | ICD-10-CM | POA: Diagnosis not present

## 2014-03-21 DIAGNOSIS — R197 Diarrhea, unspecified: Secondary | ICD-10-CM | POA: Diagnosis not present

## 2014-03-21 LAB — CBC WITH DIFFERENTIAL/PLATELET
BASO%: 0.2 % (ref 0.0–2.0)
Basophils Absolute: 0 10*3/uL (ref 0.0–0.1)
EOS%: 0.8 % (ref 0.0–7.0)
Eosinophils Absolute: 0.1 10*3/uL (ref 0.0–0.5)
HEMATOCRIT: 26.3 % — AB (ref 34.8–46.6)
HGB: 8.4 g/dL — ABNORMAL LOW (ref 11.6–15.9)
LYMPH#: 1 10*3/uL (ref 0.9–3.3)
LYMPH%: 8.3 % — ABNORMAL LOW (ref 14.0–49.7)
MCH: 29.5 pg (ref 25.1–34.0)
MCHC: 31.9 g/dL (ref 31.5–36.0)
MCV: 92.2 fL (ref 79.5–101.0)
MONO#: 0.9 10*3/uL (ref 0.1–0.9)
MONO%: 7.4 % (ref 0.0–14.0)
NEUT#: 9.7 10*3/uL — ABNORMAL HIGH (ref 1.5–6.5)
NEUT%: 83.3 % — ABNORMAL HIGH (ref 38.4–76.8)
PLATELETS: 187 10*3/uL (ref 145–400)
RBC: 2.85 10*6/uL — AB (ref 3.70–5.45)
RDW: 21.3 % — AB (ref 11.2–14.5)
WBC: 11.6 10*3/uL — ABNORMAL HIGH (ref 3.9–10.3)

## 2014-03-21 LAB — COMPREHENSIVE METABOLIC PANEL (CC13)
ALT: 27 U/L (ref 0–55)
AST: 23 U/L (ref 5–34)
Albumin: 3.2 g/dL — ABNORMAL LOW (ref 3.5–5.0)
Alkaline Phosphatase: 126 U/L (ref 40–150)
Anion Gap: 11 mEq/L (ref 3–11)
BUN: 12.3 mg/dL (ref 7.0–26.0)
CO2: 26 mEq/L (ref 22–29)
Calcium: 8.9 mg/dL (ref 8.4–10.4)
Chloride: 104 mEq/L (ref 98–109)
Creatinine: 0.7 mg/dL (ref 0.6–1.1)
EGFR: 86 mL/min/{1.73_m2} — ABNORMAL LOW (ref >90)
Glucose: 162 mg/dl — ABNORMAL HIGH (ref 70–140)
Potassium: 3.8 mEq/L (ref 3.5–5.1)
Sodium: 142 mEq/L (ref 136–145)
Total Bilirubin: 0.4 mg/dL (ref 0.20–1.20)
Total Protein: 5.6 g/dL — ABNORMAL LOW (ref 6.4–8.3)

## 2014-03-21 LAB — HOLD TUBE, BLOOD BANK

## 2014-03-21 NOTE — Telephone Encounter (Signed)
Pt confirmed labs/ov per 01/21 POF, gave pt AVS.... KJ, sent msg to add 5 hour blood...Marland KitchenMarland Kitchen

## 2014-03-21 NOTE — Assessment & Plan Note (Signed)
This is related to mild protein calorie malnutrition and recent discontinuation of diuretics. Now that her her blood pressure has improved and diarrhea is no longer an issue, I recommend she resume hydrochlorothiazide but at half the dose.

## 2014-03-21 NOTE — Assessment & Plan Note (Signed)
This could be related to side effects of chemotherapy. I recommend Lomotil. This is improved.

## 2014-03-21 NOTE — Assessment & Plan Note (Signed)
This is likely due to recent treatment. The patient denies recent history of bleeding such as epistaxis, hematuria or hematochezia. She is asymptomatic from the anemia. I will observe for now.  I will give her blood if she becomes anemic with hemoglobin less than 8 g.

## 2014-03-21 NOTE — Assessment & Plan Note (Signed)
Today, she is rate controlled. She will continue her cardiac medications.

## 2014-03-21 NOTE — Assessment & Plan Note (Signed)
She has significant dosage modification for cycle 2 and despite this is she ran into a lot of problem. I will continue aggressive supportive care and would consider discontinue further treatment beyond this. I am willing to consider maintenance rituximab in the future for her.

## 2014-03-21 NOTE — Telephone Encounter (Signed)
I have tried to schedule appt on 1/27, but no available. Scheduler advised

## 2014-03-21 NOTE — Progress Notes (Signed)
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   12/07/2013 Pathology Results Accession: [id number] biopsies show high-grade follicular lymphoma.   01/03/2014 Bone Marrow Biopsy Accession: [id number] Bone marrow biopsy is negative   01/04/2014 Imaging ECHO showed normal EF   01/04/2014 Procedure She has placement of port   01/09/2014 -  Chemotherapy She was started on bendamustine with rituximab   01/18/2014 - 02/01/2014 Hospital Admission She was admitted to the hospital from Escherichia coli sepsis with multiorgan failure and brief episodes of intubation. She was discharged to skilled nursing facility   02/26/2014 Imaging PET/CT scan showed near complete remission   02/27/2014 Adverse Reaction Cycle 2 of treatment was resumed with drastic dose adjustment to bendamustine due to recent multi-organ failure    INTERVAL HISTORY: Please see below for problem oriented charting. She returns for further supportive care visit. Her diarrhea has resolved. She is eating better. She has gained 7 pounds of fluid weight. She complained of mild bilateral leg edema.  REVIEW OF SYSTEMS:   Constitutional: Denies fevers, chills or abnormal weight loss Eyes: Denies  blurriness of vision Ears, nose, mouth, throat, and face: Denies mucositis or sore throat Respiratory: Denies cough, dyspnea or wheezes Cardiovascular: Denies palpitation, chest discomfort Gastrointestinal:  Denies nausea, heartburn or change in bowel habits Skin: Denies abnormal skin rashes Lymphatics: Denies new lymphadenopathy or easy bruising Neurological:Denies numbness, tingling or new weaknesses Behavioral/Psych: Mood is stable, no new changes  All other systems were reviewed with the patient and are negative.  I have reviewed the past medical history, past surgical history, social history and family history with the patient and they are unchanged from previous note.  ALLERGIES:  is allergic to morphine and related; demerol; meperidine hcl; multaq; oysters; penicillins; and sulfa drugs cross reactors.  MEDICATIONS:  Current Outpatient Prescriptions  Medication Sig Dispense Refill  . acetaminophen (TYLENOL) 500 MG tablet Take 1,000 mg by mouth every 6 (six) hours as needed for mild pain.     Marland Kitchen acyclovir (ZOVIRAX) 400 MG tablet Take 1 tablet (400 mg total) by mouth daily. 30 tablet 3  . albuterol (PROAIR HFA) 108 (90 BASE) MCG/ACT inhaler Inhale 2 puffs into the lungs every 6 (six) hours as needed for wheezing or shortness of breath.    . Cholecalciferol (VITAMIN D) 2000 UNITS tablet Take 2,000 Units by mouth daily with lunch.    . conjugated estrogens (PREMARIN) vaginal cream Place 1 Applicatorful vaginally 3 (three) times a week. Monday, Wednesday, and Friday nights    . Dentifrices (BIOTENE DRY MOUTH CARE DT) Place onto teeth.    . dicyclomine (BENTYL) 10 MG capsule Take 10 mg by mouth daily as needed (ibs flare up).     Marland Kitchen  digoxin (LANOXIN) 0.125 MG tablet Take 125 mcg by mouth daily.     Marland Kitchen diltiazem (CARDIZEM CD) 120 MG 24 hr capsule Take 120 mg by mouth daily.     . diphenoxylate-atropine (LOMOTIL) 2.5-0.025 MG per tablet Take 1 tablet by mouth 4 (four) times daily as needed for  diarrhea or loose stools. 60 tablet 0  . ezetimibe (ZETIA) 10 MG tablet Take 10 mg by mouth daily.     . Fluticasone-Salmeterol (ADVAIR) 250-50 MCG/DOSE AEPB Inhale 1 puff into the lungs every 12 (twelve) hours.     . hydrochlorothiazide (HYDRODIURIL) 25 MG tablet Take 25 mg by mouth daily as needed (for ankle swelling).     Marland Kitchen levothyroxine (SYNTHROID, LEVOTHROID) 125 MCG tablet Take 250 mcg by mouth daily before breakfast.     . lidocaine-prilocaine (EMLA) cream Apply 1 application topically as needed. Apply to [facility] a Cath site at least one hour prior to needle stick as needed. 30 g 3  . Magnesium Oxide 500 MG TABS Take 500 mg by mouth at bedtime.    . metFORMIN (GLUCOPHAGE-XR) 500 MG 24 hr tablet Take 500 mg by mouth 2 (two) times daily with a meal.    . montelukast (SINGULAIR) 10 MG tablet Take 10 mg by mouth daily.    Marland Kitchen omega-3 acid ethyl esters (LOVAZA) 1 G capsule Take 1 g by mouth 2 (two) times daily.    . ondansetron (ZOFRAN) 8 MG tablet Take 1 tablet (8 mg total) by mouth every 8 (eight) hours as needed. 30 tablet 1  . potassium chloride SA (K-DUR,KLOR-CON) 20 MEQ tablet Take 1 tablet (20 mEq total) by mouth daily. 30 tablet 5  . pravastatin (PRAVACHOL) 40 MG tablet Take 20 mg by mouth See admin instructions. Take 1/2 tablet (20 mg) on Monday, Tuesday, Wednesday, and Thursday    . prochlorperazine (COMPAZINE) 10 MG tablet Take 1 tablet (10 mg total) by mouth every 6 (six) hours as needed (Nausea or vomiting). 30 tablet 1  . traMADol-acetaminophen (ULTRACET) 37.5-325 MG per tablet Take 1 tablet by mouth every 6 (six) hours as needed. 30 tablet 0  . triamterene-hydrochlorothiazide (MAXZIDE-25) 37.5-25 MG per tablet Take 1 tablet by mouth every morning.    . warfarin (COUMADIN) 5 MG tablet Take 2.5-5 mg by mouth daily. Take 1/2 tablet (2.5 mg) on Wednesday and Saturday and take 1 tablet (5 mg) all other days     No current facility-administered medications for this visit.    PHYSICAL  EXAMINATION: ECOG PERFORMANCE STATUS: 0 - Asymptomatic  Filed Vitals:   03/21/14 0912  BP: 140/56  Pulse: 73  Temp: 98 F (36.7 C)  Resp: 18   Filed Weights   03/21/14 0912  Weight: 137 lb 11.2 oz (62.46 kg)    GENERAL:alert, no distress and comfortable SKIN: skin color is pale, texture, turgor are normal, no rashes or significant lesions EYES: normal, Conjunctiva are pale and non-injected, sclera clear OROPHARYNX:no exudate, no erythema and lips, buccal mucosa, and tongue normal  NECK: supple, thyroid normal size, non-tender, without nodularity LYMPH:  no palpable lymphadenopathy in the cervical, axillary or inguinal LUNGS: clear to auscultation and percussion with normal breathing effort HEART: regular rate & rhythm and no murmurs with mild bilateral lower extremity edema ABDOMEN:abdomen soft, non-tender and normal bowel sounds Musculoskeletal:no cyanosis of digits and no clubbing  NEURO: alert & oriented x 3 with fluent speech, no focal motor/sensory deficits  LABORATORY DATA:  I have reviewed the data as listed  Component Value Date/Time   NA 142 03/21/2014 0859   NA 140 01/31/2014 0600   K 3.8 03/21/2014 0859   K 3.6* 01/31/2014 0600   CL 101 01/31/2014 0600   CO2 26 03/21/2014 0859   CO2 29 01/31/2014 0600   GLUCOSE 162* 03/21/2014 0859   GLUCOSE 106* 01/31/2014 0600   BUN 12.3 03/21/2014 0859   BUN 12 01/31/2014 0600   CREATININE 0.7 03/21/2014 0859   CREATININE 0.61 01/31/2014 0600   CALCIUM 8.9 03/21/2014 0859   CALCIUM 8.0* 01/31/2014 0600   PROT 5.6* 03/21/2014 0859   PROT 5.2* 01/27/2014 0400   ALBUMIN 3.2* 03/21/2014 0859   ALBUMIN 2.5* 01/27/2014 0400   AST 23 03/21/2014 0859   AST 15 01/27/2014 0400   ALT 27 03/21/2014 0859   ALT 15 01/27/2014 0400   ALKPHOS 126 03/21/2014 0859   ALKPHOS 67 01/27/2014 0400   BILITOT 0.40 03/21/2014 0859   BILITOT 0.4 01/27/2014 0400   GFRNONAA 87* 01/31/2014 0600   GFRAA >90 01/31/2014 0600    No results  found for: SPEP, UPEP  Lab Results  Component Value Date   WBC 11.6* 03/21/2014   NEUTROABS 9.7* 03/21/2014   HGB 8.4* 03/21/2014   HCT 26.3* 03/21/2014   MCV 92.2 03/21/2014   PLT 187 03/21/2014      Chemistry      Component Value Date/Time   NA 142 03/21/2014 0859   NA 140 01/31/2014 0600   K 3.8 03/21/2014 0859   K 3.6* 01/31/2014 0600   CL 101 01/31/2014 0600   CO2 26 03/21/2014 0859   CO2 29 01/31/2014 0600   BUN 12.3 03/21/2014 0859   BUN 12 01/31/2014 0600   CREATININE 0.7 03/21/2014 0859   CREATININE 0.61 01/31/2014 0600      Component Value Date/Time   CALCIUM 8.9 03/21/2014 0859   CALCIUM 8.0* 01/31/2014 0600   ALKPHOS 126 03/21/2014 0859   ALKPHOS 67 01/27/2014 0400   AST 23 03/21/2014 0859   AST 15 01/27/2014 0400   ALT 27 03/21/2014 0859   ALT 15 01/27/2014 0400   BILITOT 0.40 03/21/2014 0859   BILITOT 0.4 01/27/2014 0400      ASSESSMENT & PLAN:  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 I will continue aggressive supportive care and would consider discontinue further treatment beyond this. I am willing to consider maintenance rituximab in the future for her.    Anemia in neoplastic disease This is likely due to recent treatment. The patient denies recent history of bleeding such as epistaxis, hematuria or hematochezia. She is asymptomatic from the anemia. I will observe for now.  I will give her blood if she becomes anemic with hemoglobin less than 8 g.   Atrial fibrillation Today, she is rate controlled. She will continue her cardiac medications.   Diarrhea This could be related to side effects of chemotherapy. I recommend Lomotil. This is improved.   Bilateral leg edema This is related to mild protein calorie malnutrition and recent discontinuation of diuretics. Now that her her blood pressure has improved and  diarrhea is no longer an issue, I recommend she resume hydrochlorothiazide but at half the dose.    No orders of the defined types were placed in this encounter.   All questions were answered. The patient knows to call the clinic with any problems, questions or concerns.  No barriers to learning was detected. I spent 25 minutes counseling the patient face to face. The total time spent in the appointment was 30 minutes and more than 50% was on counseling and review of test results     [facility], Dametria Tuzzolino, MD 03/21/2014 10:33 AM

## 2014-03-22 ENCOUNTER — Ambulatory Visit: Payer: Medicare Other

## 2014-03-25 ENCOUNTER — Telehealth: Payer: Self-pay | Admitting: Hematology and Oncology

## 2014-03-25 NOTE — Telephone Encounter (Signed)
OK, don't worry about it       Previous Messages     ----- Message -----   From: Selena Lesser   Sent: 03/22/2014 12:09 PM    To: Selena Lesser, Phonacelle Malachy Moan, RN, *  Subject: FW: Please add 5 hour blood product        Please see Michelle's note below per adding 5 hour blood product for 01/27....   Thanks   KIM  ----- Message -----   From: Orie Rout, NT   Sent: 03/21/2014  2:19 PM    To: Selena Lesser  Subject: RE: Please add 5 hour blood product        I have nothing  ----- Message -----   From: Selena Lesser   Sent: 03/21/2014  9:49 AM    To: Purcell Nails Wheat, NT  Subject: Please add 5 hour blood product          Please add 5 hour blood product per 01/21 POF.....    Thanks   KIM

## 2014-03-26 ENCOUNTER — Telehealth: Payer: Self-pay | Admitting: *Deleted

## 2014-03-26 NOTE — Telephone Encounter (Signed)
Pt scheduled for blood transfusion at [facility] tomorrow after her Lab/MD appt here on 1/27.  There is no availability for transfusion at our clinic on 1/27.

## 2014-03-27 ENCOUNTER — Other Ambulatory Visit (HOSPITAL_BASED_OUTPATIENT_CLINIC_OR_DEPARTMENT_OTHER): Payer: Medicare Other

## 2014-03-27 ENCOUNTER — Encounter: Payer: Self-pay | Admitting: Hematology and Oncology

## 2014-03-27 ENCOUNTER — Telehealth: Payer: Self-pay | Admitting: Hematology and Oncology

## 2014-03-27 ENCOUNTER — Ambulatory Visit (HOSPITAL_COMMUNITY)
Admission: RE | Admit: 2014-03-27 | Discharge: 2014-03-27 | Disposition: A | Discharge status: Home / Self Care | Payer: No Typology Code available for payment source | Source: Ambulatory Visit | Attending: Hematology and Oncology | Admitting: Hematology and Oncology

## 2014-03-27 ENCOUNTER — Ambulatory Visit (HOSPITAL_BASED_OUTPATIENT_CLINIC_OR_DEPARTMENT_OTHER): Payer: Medicare Other | Admitting: Hematology and Oncology

## 2014-03-27 VITALS — BP 116/55 | HR 80 | Temp 97.5°F | Resp 18 | Ht 66.0 in | Wt 132.7 lb

## 2014-03-27 DIAGNOSIS — C8591 Non-Hodgkin lymphoma, unspecified, lymph nodes of head, face, and neck: Secondary | ICD-10-CM

## 2014-03-27 DIAGNOSIS — E46 Unspecified protein-calorie malnutrition: Secondary | ICD-10-CM

## 2014-03-27 DIAGNOSIS — R197 Diarrhea, unspecified: Secondary | ICD-10-CM | POA: Diagnosis not present

## 2014-03-27 DIAGNOSIS — D630 Anemia in neoplastic disease: Secondary | ICD-10-CM

## 2014-03-27 LAB — COMPREHENSIVE METABOLIC PANEL (CC13)
ALT: 30 U/L (ref 0–55)
ANION GAP: 11 meq/L (ref 3–11)
AST: 32 U/L (ref 5–34)
Albumin: 3.3 g/dL — ABNORMAL LOW (ref 3.5–5.0)
Alkaline Phosphatase: 126 U/L (ref 40–150)
BILIRUBIN TOTAL: 0.47 mg/dL (ref 0.20–1.20)
BUN: 13.7 mg/dL (ref 7.0–26.0)
CO2: 25 mEq/L (ref 22–29)
Calcium: 8.8 mg/dL (ref 8.4–10.4)
Chloride: 104 mEq/L (ref 98–109)
Creatinine: 0.7 mg/dL (ref 0.6–1.1)
EGFR: 82 mL/min/{1.73_m2} — ABNORMAL LOW (ref >90)
GLUCOSE: 106 mg/dL (ref 70–140)
Potassium: 3.9 mEq/L (ref 3.5–5.1)
Sodium: 140 mEq/L (ref 136–145)
TOTAL PROTEIN: 5.8 g/dL — AB (ref 6.4–8.3)

## 2014-03-27 LAB — CBC WITH DIFFERENTIAL/PLATELET
BASO%: 0.8 % (ref 0.0–2.0)
BASOS ABS: 0.1 10*3/uL (ref 0.0–0.1)
EOS%: 1.5 % (ref 0.0–7.0)
Eosinophils Absolute: 0.2 10*3/uL (ref 0.0–0.5)
HCT: 27.7 % — ABNORMAL LOW (ref 34.8–46.6)
HGB: 9 g/dL — ABNORMAL LOW (ref 11.6–15.9)
LYMPH%: 12.1 % — ABNORMAL LOW (ref 14.0–49.7)
MCH: 29.7 pg (ref 25.1–34.0)
MCHC: 32.4 g/dL (ref 31.5–36.0)
MCV: 91.8 fL (ref 79.5–101.0)
MONO#: 1.2 10*3/uL — ABNORMAL HIGH (ref 0.1–0.9)
MONO%: 12.1 % (ref 0.0–14.0)
NEUT%: 73.5 % (ref 38.4–76.8)
NEUTROS ABS: 7.5 10*3/uL — AB (ref 1.5–6.5)
Platelets: 282 10*3/uL (ref 145–400)
RBC: 3.02 10*6/uL — ABNORMAL LOW (ref 3.70–5.45)
RDW: 21.2 % — ABNORMAL HIGH (ref 11.2–14.5)
WBC: 10.2 10*3/uL (ref 3.9–10.3)
lymph#: 1.2 10*3/uL (ref 0.9–3.3)

## 2014-03-27 LAB — HOLD TUBE, BLOOD BANK

## 2014-03-27 MED ORDER — SODIUM CHLORIDE 0.9 % IV SOLN
INTRAVENOUS | Status: AC
  Administered 2014-03-27: 10:00:00 via INTRAVENOUS

## 2014-03-27 MED ORDER — HEPARIN SOD (PORK) LOCK FLUSH 100 UNIT/ML IV SOLN
250.0000 [IU] | INTRAVENOUS | Status: AC | PRN
  Administered 2014-03-27: 250 [IU]
  Filled 2014-03-27: qty 5

## 2014-03-27 MED ORDER — SODIUM CHLORIDE 0.9 % IJ SOLN
3.0000 mL | INTRAMUSCULAR | Status: AC | PRN
  Administered 2014-03-27: 3 mL

## 2014-03-27 MED ORDER — PROMETHAZINE HCL 25 MG/ML IJ SOLN
12.5000 mg | Freq: Once | INTRAMUSCULAR | Status: AC
  Administered 2014-03-27: 12.5 mg via INTRAVENOUS
  Filled 2014-03-27: qty 1

## 2014-03-27 NOTE — Assessment & Plan Note (Signed)
She has lost weight due to dehydration and diarrhea. I recommend we stop treatment and focus on supportive care. I plan to start her on maintenance rituximab next month.

## 2014-03-27 NOTE — Progress Notes (Signed)
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   12/07/2013 Pathology Results Accession: [id number] biopsies show high-grade follicular lymphoma.   01/03/2014 Bone Marrow Biopsy Accession: [id number] Bone marrow biopsy is negative   01/04/2014 Imaging ECHO showed normal EF   01/04/2014 Procedure She has placement of port   01/09/2014 - 03/07/2014 Chemotherapy She was given treatment with bendamustine with rituximab. Treatment was stopped due to severe side-effects despite significant dose adjustment for cycle 2   01/18/2014 - 02/01/2014 Hospital Admission She was admitted to the hospital from Escherichia coli sepsis with multiorgan failure and brief episodes of intubation. She was discharged to skilled nursing facility   02/26/2014 Imaging PET/CT scan showed near complete remission   02/27/2014 Adverse Reaction Cycle 2 of treatment was resumed with drastic dose adjustment to bendamustine due to recent multi-organ failure    INTERVAL HISTORY: Please see below for problem oriented charting. She is seen today for supportive care. Her diarrhea have subsided over the last few days but started again last night. She had 5 bowel movements since last  night. There were no associated crampy abdominal pain. There were no blood in her stool. It is mainly liquid. She started taking Lomotil again. She denies nausea or vomiting. Her energy level is fair. The patient denies any recent signs or symptoms of bleeding such as spontaneous epistaxis, hematuria or hematochezia.   REVIEW OF SYSTEMS:   Constitutional: Denies fevers, chills or abnormal weight loss Eyes: Denies blurriness of vision Ears, nose, mouth, throat, and face: Denies mucositis or sore throat Respiratory: Denies cough, dyspnea or wheezes Cardiovascular: Denies palpitation, chest discomfort or lower extremity swelling Skin: Denies abnormal skin rashes Lymphatics: Denies new lymphadenopathy or easy bruising Neurological:Denies numbness, tingling or new weaknesses Behavioral/Psych: Mood is stable, no new changes  All other systems were reviewed with the patient and are negative.  I have reviewed the past medical history, past surgical history, social history and family history with the patient and they are unchanged from previous note.  ALLERGIES:  is allergic to morphine and related; demerol; meperidine hcl; multaq; oysters; penicillins; and sulfa drugs cross reactors.  MEDICATIONS:  Current Outpatient Prescriptions  Medication Sig Dispense Refill  . acetaminophen (TYLENOL) 500 MG tablet Take 1,000 mg by mouth every 6 (six) hours as needed for mild pain.     Marland Kitchen albuterol (PROAIR HFA) 108 (90 BASE) MCG/ACT inhaler Inhale 2 puffs into the lungs every 6 (six) hours as needed for wheezing or shortness of breath.    . Cholecalciferol (VITAMIN D) 2000 UNITS tablet Take 2,000 Units by mouth daily with lunch.    . conjugated estrogens (PREMARIN) vaginal cream Place 1 Applicatorful vaginally 3 (three) times a week. Monday, Wednesday,  and Friday nights    . Dentifrices (BIOTENE DRY MOUTH CARE DT) Place onto teeth.    . dicyclomine (BENTYL) 10 MG capsule Take 10 mg by mouth daily as needed  (ibs flare up).     . digoxin (LANOXIN) 0.125 MG tablet Take 125 mcg by mouth daily.     Marland Kitchen diltiazem (CARDIZEM CD) 120 MG 24 hr capsule Take 120 mg by mouth daily.     . diphenoxylate-atropine (LOMOTIL) 2.5-0.025 MG per tablet Take 1 tablet by mouth 4 (four) times daily as needed for diarrhea or loose stools. 60 tablet 0  . ezetimibe (ZETIA) 10 MG tablet Take 10 mg by mouth daily.     . Fluticasone-Salmeterol (ADVAIR) 250-50 MCG/DOSE AEPB Inhale 1 puff into the lungs every 12 (twelve) hours.     . hydrochlorothiazide (HYDRODIURIL) 25 MG tablet Take 25 mg by mouth daily as needed (for ankle swelling).     Marland Kitchen levothyroxine (SYNTHROID, LEVOTHROID) 125 MCG tablet Take 250 mcg by mouth daily before breakfast.     . lidocaine-prilocaine (EMLA) cream Apply 1 application topically as needed. Apply to [facility] a Cath site at least one hour prior to needle stick as needed. 30 g 3  . Magnesium Oxide 500 MG TABS Take 500 mg by mouth at bedtime.    . metFORMIN (GLUCOPHAGE-XR) 500 MG 24 hr tablet Take 500 mg by mouth 2 (two) times daily with a meal.    . montelukast (SINGULAIR) 10 MG tablet Take 10 mg by mouth daily.    Marland Kitchen omega-3 acid ethyl esters (LOVAZA) 1 G capsule Take 1 g by mouth 2 (two) times daily.    . potassium chloride SA (K-DUR,KLOR-CON) 20 MEQ tablet Take 1 tablet (20 mEq total) by mouth daily. 30 tablet 5  . pravastatin (PRAVACHOL) 40 MG tablet Take 20 mg by mouth See admin instructions. Take 1/2 tablet (20 mg) on Monday, Tuesday, Wednesday, and Thursday    . warfarin (COUMADIN) 5 MG tablet Take 2.5-5 mg by mouth daily. Take 1/2 tablet (2.5 mg) on Wednesday and Saturday and take 1 tablet (5 mg) all other days    . traMADol-acetaminophen (ULTRACET) 37.5-325 MG per tablet Take 1 tablet by mouth every 6 (six) hours as needed. (Patient not taking: Reported on 03/27/2014) 30 tablet 0  . triamterene-hydrochlorothiazide (MAXZIDE-25) 37.5-25 MG per tablet Take 1 tablet by mouth every morning.     No current  facility-administered medications for this visit.    PHYSICAL EXAMINATION: ECOG PERFORMANCE STATUS: 1 - Symptomatic but completely ambulatory  Filed Vitals:   03/27/14 0816  BP: 116/55  Pulse: 80  Temp: 97.5 F (36.4 C)  Resp: 18   Filed Weights   03/27/14 0816  Weight: 132 lb 11.2 oz (60.192 kg)    GENERAL:alert, no distress and comfortable SKIN: skin color, texture, turgor are normal, no rashes or significant lesions EYES: normal, Conjunctiva are pink and non-injected, sclera clear OROPHARYNX:no exudate, no erythema and lips, buccal mucosa, and tongue normal . She has dry mucous membrane NECK: supple, thyroid normal size, non-tender, without nodularity LYMPH:  no palpable lymphadenopathy in the cervical, axillary or inguinal LUNGS: clear to auscultation and percussion with normal breathing effort HEART: regular rate & rhythm and no murmurs and no lower extremity edema ABDOMEN:abdomen soft, non-tender and normal bowel sounds Musculoskeletal:no cyanosis of digits and no clubbing  NEURO: alert & oriented x 3 with fluent speech, no focal motor/sensory deficits  LABORATORY DATA:  I have reviewed the data as listed  Component Value Date/Time   NA 140 03/27/2014 0803   NA 140 01/31/2014 0600   K 3.9 03/27/2014 0803   K 3.6* 01/31/2014 0600   CL 101 01/31/2014 0600   CO2 25 03/27/2014 0803   CO2 29 01/31/2014 0600   GLUCOSE 106 03/27/2014 0803   GLUCOSE 106* 01/31/2014 0600   BUN 13.7 03/27/2014 0803   BUN 12 01/31/2014 0600   CREATININE 0.7 03/27/2014 0803   CREATININE 0.61 01/31/2014 0600   CALCIUM 8.8 03/27/2014 0803   CALCIUM 8.0* 01/31/2014 0600   PROT 5.8* 03/27/2014 0803   PROT 5.2* 01/27/2014 0400   ALBUMIN 3.3* 03/27/2014 0803   ALBUMIN 2.5* 01/27/2014 0400   AST 32 03/27/2014 0803   AST 15 01/27/2014 0400   ALT 30 03/27/2014 0803   ALT 15 01/27/2014 0400   ALKPHOS 126 03/27/2014 0803   ALKPHOS 67 01/27/2014 0400   BILITOT 0.47 03/27/2014 0803    BILITOT 0.4 01/27/2014 0400   GFRNONAA 87* 01/31/2014 0600   GFRAA >90 01/31/2014 0600    No results found for: SPEP, UPEP  Lab Results  Component Value Date   WBC 10.2 03/27/2014   NEUTROABS 7.5* 03/27/2014   HGB 9.0* 03/27/2014   HCT 27.7* 03/27/2014   MCV 91.8 03/27/2014   PLT 282 03/27/2014      Chemistry      Component Value Date/Time   NA 140 03/27/2014 0803   NA 140 01/31/2014 0600   K 3.9 03/27/2014 0803   K 3.6* 01/31/2014 0600   CL 101 01/31/2014 0600   CO2 25 03/27/2014 0803   CO2 29 01/31/2014 0600   BUN 13.7 03/27/2014 0803   BUN 12 01/31/2014 0600   CREATININE 0.7 03/27/2014 0803   CREATININE 0.61 01/31/2014 0600      Component Value Date/Time   CALCIUM 8.8 03/27/2014 0803   CALCIUM 8.0* 01/31/2014 0600   ALKPHOS 126 03/27/2014 0803   ALKPHOS 67 01/27/2014 0400   AST 32 03/27/2014 0803   AST 15 01/27/2014 0400   ALT 30 03/27/2014 0803   ALT 15 01/27/2014 0400   BILITOT 0.47 03/27/2014 0803   BILITOT 0.4 01/27/2014 0400      ASSESSMENT & PLAN:  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 I will continue aggressive supportive care and would consider discontinue further treatment beyond this. I plan to give maintenance rituximab in the future for her.      Anemia in neoplastic disease This is likely due to recent treatment. The patient denies recent history of bleeding such as epistaxis, hematuria or hematochezia. She is asymptomatic from the anemia. I will observe for now.  I will give her blood if she becomes anemic with hemoglobin less than 8 g.   Diarrhea This could be related to side effects of chemotherapy. I recommend Lomotil. I suspect it will improve over time. Unfortunately, she has lost 5 pounds and appeared dehydrated with mild hypotension. I recommend IV fluids today.     Protein calorie malnutrition She  has lost weight due to dehydration and diarrhea. I recommend we stop treatment and focus on supportive care. I plan to start her on maintenance rituximab next month.    No orders of the defined types were placed in this encounter.   All questions were answered. The patient knows to call the clinic with any problems, questions or concerns.  No barriers to learning was detected. I spent 30 minutes counseling the patient face to face. The total time spent in the appointment was 40 minutes and more than 50% was on counseling and review of test results     [facility], [place], MD 03/27/2014 8:49 AM

## 2014-03-27 NOTE — Assessment & Plan Note (Signed)
This could be related to side effects of chemotherapy. I recommend Lomotil. I suspect it will improve over time. Unfortunately, she has lost 5 pounds and appeared dehydrated with mild hypotension. I recommend IV fluids today.

## 2014-03-27 NOTE — Assessment & Plan Note (Signed)
This is likely due to recent treatment. The patient denies recent history of bleeding such as epistaxis, hematuria or hematochezia. She is asymptomatic from the anemia. I will observe for now.  I will give her blood if she becomes anemic with hemoglobin less than 8 g.

## 2014-03-27 NOTE — Telephone Encounter (Signed)
Gave avs calendar °

## 2014-03-27 NOTE — Assessment & Plan Note (Signed)
She has significant dosage modification for cycle 2 and despite this is she ran into a lot of problem. I will continue aggressive supportive care and would consider discontinue further treatment beyond this. I plan to give maintenance rituximab in the future for her.

## 2014-03-27 NOTE — Procedures (Signed)
[facility]  Procedure Note  Patty [name] BXI:[id number] DOB: 11-17-1940 DOA: 03/27/2014   Dr. Alvy Bimler Associated Diagnosis: Diarrhea, anemia in neoplastic disease  Procedure Note: port accessed, blood return noted, IV fluids infused per order, port flushed and de-accessed   Condition During Procedure: patient stable throughout infusion   Condition at Discharge: denies any discomfort.  Husband at side.   Roberto Scales, RN  [facility]

## 2014-03-28 ENCOUNTER — Ambulatory Visit
Admission: RE | Admit: 2014-03-28 | Discharge: 2014-03-28 | Disposition: A | Discharge status: Home / Self Care | Payer: Medicare Other | Source: Ambulatory Visit

## 2014-03-28 ENCOUNTER — Other Ambulatory Visit: Payer: Self-pay

## 2014-03-28 DIAGNOSIS — Z1231 Encounter for screening mammogram for malignant neoplasm of breast: Secondary | ICD-10-CM | POA: Diagnosis not present

## 2014-03-28 DIAGNOSIS — Z7901 Long term (current) use of anticoagulants: Secondary | ICD-10-CM | POA: Diagnosis not present

## 2014-04-03 ENCOUNTER — Other Ambulatory Visit: Payer: Self-pay | Admitting: Hematology and Oncology

## 2014-04-03 ENCOUNTER — Other Ambulatory Visit (HOSPITAL_BASED_OUTPATIENT_CLINIC_OR_DEPARTMENT_OTHER): Payer: Medicare Other

## 2014-04-03 ENCOUNTER — Ambulatory Visit (HOSPITAL_BASED_OUTPATIENT_CLINIC_OR_DEPARTMENT_OTHER): Payer: Medicare Other

## 2014-04-03 ENCOUNTER — Ambulatory Visit (HOSPITAL_BASED_OUTPATIENT_CLINIC_OR_DEPARTMENT_OTHER): Payer: Medicare Other | Admitting: Hematology and Oncology

## 2014-04-03 ENCOUNTER — Telehealth: Payer: Self-pay | Admitting: Hematology and Oncology

## 2014-04-03 DIAGNOSIS — I482 Chronic atrial fibrillation, unspecified: Secondary | ICD-10-CM

## 2014-04-03 DIAGNOSIS — R197 Diarrhea, unspecified: Secondary | ICD-10-CM | POA: Diagnosis not present

## 2014-04-03 DIAGNOSIS — C8591 Non-Hodgkin lymphoma, unspecified, lymph nodes of head, face, and neck: Secondary | ICD-10-CM

## 2014-04-03 DIAGNOSIS — E860 Dehydration: Secondary | ICD-10-CM | POA: Diagnosis not present

## 2014-04-03 DIAGNOSIS — E46 Unspecified protein-calorie malnutrition: Secondary | ICD-10-CM | POA: Diagnosis not present

## 2014-04-03 DIAGNOSIS — Z5112 Encounter for antineoplastic immunotherapy: Secondary | ICD-10-CM

## 2014-04-03 DIAGNOSIS — D630 Anemia in neoplastic disease: Secondary | ICD-10-CM

## 2014-04-03 DIAGNOSIS — R600 Localized edema: Secondary | ICD-10-CM | POA: Diagnosis not present

## 2014-04-03 LAB — CBC WITH DIFFERENTIAL/PLATELET
BASO%: 0.6 % (ref 0.0–2.0)
Basophils Absolute: 0.1 10*3/uL (ref 0.0–0.1)
EOS ABS: 0.2 10*3/uL (ref 0.0–0.5)
EOS%: 2 % (ref 0.0–7.0)
HEMATOCRIT: 28.9 % — AB (ref 34.8–46.6)
HGB: 9.4 g/dL — ABNORMAL LOW (ref 11.6–15.9)
LYMPH#: 1.3 10*3/uL (ref 0.9–3.3)
LYMPH%: 10.4 % — AB (ref 14.0–49.7)
MCH: 30 pg (ref 25.1–34.0)
MCHC: 32.7 g/dL (ref 31.5–36.0)
MCV: 91.9 fL (ref 79.5–101.0)
MONO#: 1.2 10*3/uL — AB (ref 0.1–0.9)
MONO%: 10.3 % (ref 0.0–14.0)
NEUT#: 9.3 10*3/uL — ABNORMAL HIGH (ref 1.5–6.5)
NEUT%: 76.7 % (ref 38.4–76.8)
PLATELETS: 232 10*3/uL (ref 145–400)
RBC: 3.14 10*6/uL — AB (ref 3.70–5.45)
RDW: 20.3 % — ABNORMAL HIGH (ref 11.2–14.5)
WBC: 12.2 10*3/uL — ABNORMAL HIGH (ref 3.9–10.3)

## 2014-04-03 LAB — COMPREHENSIVE METABOLIC PANEL (CC13)
ALT: 27 U/L (ref 0–55)
ANION GAP: 10 meq/L (ref 3–11)
AST: 30 U/L (ref 5–34)
Albumin: 3.2 g/dL — ABNORMAL LOW (ref 3.5–5.0)
Alkaline Phosphatase: 124 U/L (ref 40–150)
BILIRUBIN TOTAL: 0.49 mg/dL (ref 0.20–1.20)
BUN: 9.8 mg/dL (ref 7.0–26.0)
CALCIUM: 8.8 mg/dL (ref 8.4–10.4)
CO2: 24 meq/L (ref 22–29)
Chloride: 105 mEq/L (ref 98–109)
Creatinine: 0.7 mg/dL (ref 0.6–1.1)
EGFR: 84 mL/min/{1.73_m2} — AB (ref >90)
Glucose: 133 mg/dl (ref 70–140)
Potassium: 4.4 mEq/L (ref 3.5–5.1)
SODIUM: 140 meq/L (ref 136–145)
TOTAL PROTEIN: 5.8 g/dL — AB (ref 6.4–8.3)

## 2014-04-03 LAB — HOLD TUBE, BLOOD BANK

## 2014-04-03 MED ORDER — SODIUM CHLORIDE 0.9 % IV SOLN
375.0000 mg/m2 | Freq: Once | INTRAVENOUS | Status: AC
  Administered 2014-04-03: 600 mg via INTRAVENOUS
  Filled 2014-04-03: qty 60

## 2014-04-03 MED ORDER — HEPARIN SOD (PORK) LOCK FLUSH 100 UNIT/ML IV SOLN
500.0000 [IU] | Freq: Once | INTRAVENOUS | Status: AC | PRN
  Administered 2014-04-03: 500 [IU]
  Filled 2014-04-03: qty 5

## 2014-04-03 MED ORDER — ACETAMINOPHEN 325 MG PO TABS
ORAL_TABLET | ORAL | Status: AC
  Filled 2014-04-03: qty 2

## 2014-04-03 MED ORDER — DIPHENOXYLATE-ATROPINE 2.5-0.025 MG PO TABS
1.0000 | ORAL_TABLET | Freq: Four times a day (QID) | ORAL | Status: DC | PRN
Start: 2014-04-03 — End: 2014-08-08

## 2014-04-03 MED ORDER — SODIUM CHLORIDE 0.9 % IJ SOLN
10.0000 mL | INTRAMUSCULAR | Status: DC | PRN
  Administered 2014-04-03: 10 mL
  Filled 2014-04-03: qty 10

## 2014-04-03 MED ORDER — DIPHENHYDRAMINE HCL 25 MG PO CAPS
ORAL_CAPSULE | ORAL | Status: AC
  Filled 2014-04-03: qty 2

## 2014-04-03 MED ORDER — DIPHENHYDRAMINE HCL 25 MG PO CAPS
50.0000 mg | ORAL_CAPSULE | Freq: Once | ORAL | Status: AC
  Administered 2014-04-03: 50 mg via ORAL

## 2014-04-03 MED ORDER — ACETAMINOPHEN 325 MG PO TABS
650.0000 mg | ORAL_TABLET | Freq: Once | ORAL | Status: AC
  Administered 2014-04-03: 650 mg via ORAL

## 2014-04-03 MED ORDER — SODIUM CHLORIDE 0.9 % IV SOLN
Freq: Once | INTRAVENOUS | Status: AC
  Administered 2014-04-03: 10:00:00 via INTRAVENOUS

## 2014-04-03 NOTE — Progress Notes (Deleted)
Patient expressed nerves and asked for something to relax.  Verbal order received and read back from Dr. Alen Blew for ativan 1 mg po.

## 2014-04-03 NOTE — Progress Notes (Signed)
Tolerating rituxan at goal rate of 400 mg /hr.

## 2014-04-03 NOTE — Telephone Encounter (Signed)
gv pt husband avs report and appts for feb/april. husband also given script from desk nurse - per desk nurse no signature needed.

## 2014-04-03 NOTE — Progress Notes (Signed)
Rituxan increased to 200 mg /hr

## 2014-04-03 NOTE — Progress Notes (Signed)
VSS pre-rituxan

## 2014-04-03 NOTE — Patient Instructions (Signed)
[place] Discharge Instructions for Patients Receiving Chemotherapy  Today you received the following chemotherapy agents Rituxan, treanda  To help prevent nausea and vomiting after your treatment, we encourage you to take your nausea medication as directed   If you develop nausea and vomiting that is not controlled by your nausea medication, call the clinic.   BELOW ARE SYMPTOMS THAT SHOULD BE REPORTED IMMEDIATELY:  *FEVER GREATER THAN 100.5 F  *CHILLS WITH OR WITHOUT FEVER  NAUSEA AND VOMITING THAT IS NOT CONTROLLED WITH YOUR NAUSEA MEDICATION  *UNUSUAL SHORTNESS OF BREATH  *UNUSUAL BRUISING OR BLEEDING  TENDERNESS IN MOUTH AND THROAT WITH OR WITHOUT PRESENCE OF ULCERS  *URINARY PROBLEMS  *BOWEL PROBLEMS  UNUSUAL RASH Items with * indicate a potential emergency and should be followed up as soon as possible.  Feel free to call the clinic you have any questions or concerns. The clinic phone number is [phone].

## 2014-04-03 NOTE — Progress Notes (Signed)
Rituxan increased to 400 mg/hr. 

## 2014-04-03 NOTE — Progress Notes (Signed)
Discharged with spouse to home, ambulatory in no distress.

## 2014-04-03 NOTE — Progress Notes (Signed)
Rituxan increased to 300 mg/hr 

## 2014-04-04 ENCOUNTER — Encounter: Payer: Self-pay | Admitting: Hematology and Oncology

## 2014-04-04 ENCOUNTER — Ambulatory Visit: Payer: Medicare Other

## 2014-04-04 DIAGNOSIS — Z7901 Long term (current) use of anticoagulants: Secondary | ICD-10-CM | POA: Diagnosis not present

## 2014-04-04 NOTE — Assessment & Plan Note (Signed)
Today, she is rate controlled. She will continue her cardiac medications.

## 2014-04-04 NOTE — Assessment & Plan Note (Signed)
This is likely due to recent treatment. The patient denies recent history of bleeding such as epistaxis, hematuria or hematochezia. She is asymptomatic from the anemia. I will observe for now.  I will give her blood if she becomes anemic with hemoglobin less than 8 g.

## 2014-04-04 NOTE — Assessment & Plan Note (Signed)
She has significant dosage modification for cycle 2 and despite this is she ran into a lot of problem. I plan to give maintenance rituximab in the future for her. She has almost recovered from recent side effects of treatment. I will continue to see her on a monthly basis to provide supportive care

## 2014-04-04 NOTE — Assessment & Plan Note (Signed)
This has improved. I continue to recommend she hold her diuretic therapy due to recent dehydration.

## 2014-04-04 NOTE — Progress Notes (Signed)
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   01/03/2014 Bone Marrow Biopsy Accession: [id number] Bone marrow biopsy is negative   01/04/2014 Imaging ECHO showed normal EF   01/04/2014 Procedure She has placement of port   01/09/2014 - 03/07/2014 Chemotherapy She was given treatment with bendamustine with rituximab. Treatment was stopped due to severe side-effects despite significant dose adjustment for cycle 2   01/18/2014 - 02/01/2014 Hospital Admission She was admitted to the hospital from [name] with multiorgan failure and brief episodes of intubation. She was discharged to skilled nursing facility   02/26/2014 Imaging PET/CT scan showed near complete remission   02/27/2014 Adverse Reaction Cycle 2 of treatment was resumed with drastic dose adjustment to bendamustine due to recent multi-organ failure   04/03/2014 -  Chemotherapy She is started on maintenance rituximab only.    INTERVAL HISTORY: Please see below for problem oriented charting. She is seen today for cycle 1 of maintenance rituximab She continues to have mild diarrhea.  She is able to eat and drink well.  REVIEW OF SYSTEMS:   Constitutional: Denies fevers, chills or abnormal weight loss Eyes: Denies blurriness of vision Ears, nose, mouth, throat, and face: Denies mucositis or sore throat Respiratory: Denies cough, dyspnea or wheezes Cardiovascular: Denies palpitation, chest discomfort or lower extremity swelling Skin: Denies abnormal skin rashes Lymphatics: Denies new lymphadenopathy or easy bruising Neurological:Denies numbness, tingling or new weaknesses Behavioral/Psych: Mood is stable, no new changes  All other systems were reviewed with the patient and are negative.  I have reviewed the past medical history, past surgical history, social history and family history with the patient and they are unchanged from previous note.  ALLERGIES:  is allergic to morphine and related; demerol; meperidine hcl; multaq; oysters; penicillins; and sulfa drugs cross reactors.  MEDICATIONS:  Current Outpatient Prescriptions  Medication Sig Dispense Refill  . acetaminophen (TYLENOL) 500 MG tablet Take 1,000 mg by mouth every 6 (six) hours as needed for mild pain.     Marland Kitchen albuterol (PROAIR HFA) 108 (90 BASE) MCG/ACT inhaler Inhale 2 puffs into the lungs every 6 (six) hours as needed for wheezing or shortness of breath.    . Cholecalciferol (VITAMIN D) 2000 UNITS tablet Take 2,000 Units by mouth daily with lunch.    . conjugated estrogens (PREMARIN) vaginal cream Place 1 Applicatorful vaginally 3 (three) times a week. Monday, Wednesday, and Friday nights    . Dentifrices (BIOTENE DRY MOUTH CARE DT) Place onto teeth.    . dicyclomine (BENTYL) 10 MG capsule Take 10 mg by mouth daily as needed (ibs flare up).     . digoxin (LANOXIN)  0.125 MG tablet Take 125 mcg by mouth daily.     Marland Kitchen diltiazem (CARDIZEM CD) 120 MG 24 hr capsule Take 120 mg by mouth daily.     . diphenoxylate-atropine (LOMOTIL) 2.5-0.025 MG per tablet Take 1 tablet by mouth 4 (four) times daily as needed for  diarrhea or loose stools. 90 tablet 0  . ezetimibe (ZETIA) 10 MG tablet Take 10 mg by mouth daily.     . Fluticasone-Salmeterol (ADVAIR) 250-50 MCG/DOSE AEPB Inhale 1 puff into the lungs every 12 (twelve) hours.     Marland Kitchen levothyroxine (SYNTHROID, LEVOTHROID) 125 MCG tablet Take 250 mcg by mouth daily before breakfast.     . lidocaine-prilocaine (EMLA) cream Apply 1 application topically as needed. Apply to [facility] a Cath site at least one hour prior to needle stick as needed. 30 g 3  . Magnesium Oxide 500 MG TABS Take 500 mg by mouth at bedtime.    . metFORMIN (GLUCOPHAGE-XR) 500 MG 24 hr tablet Take 500 mg by mouth 2 (two) times daily with a meal.    . montelukast (SINGULAIR) 10 MG tablet Take 10 mg by mouth daily.    Marland Kitchen omega-3 acid ethyl esters (LOVAZA) 1 G capsule Take 1 g by mouth 2 (two) times daily.    . potassium chloride SA (K-DUR,KLOR-CON) 20 MEQ tablet Take 1 tablet (20 mEq total) by mouth daily. 30 tablet 5  . pravastatin (PRAVACHOL) 40 MG tablet Take 20 mg by mouth See admin instructions. Take 1/2 tablet (20 mg) on Monday, Tuesday, Wednesday, and Thursday    . traMADol-acetaminophen (ULTRACET) 37.5-325 MG per tablet Take 1 tablet by mouth every 6 (six) hours as needed. (Patient not taking: Reported on 03/27/2014) 30 tablet 0  . warfarin (COUMADIN) 5 MG tablet Take 2.5-5 mg by mouth daily. Take 1/2 tablet (2.5 mg) on Wednesday and Saturday and take 1 tablet (5 mg) all other days     No current facility-administered medications for this visit.    PHYSICAL EXAMINATION: ECOG PERFORMANCE STATUS: 1 - Symptomatic but completely ambulatory  There were no vitals filed for this visit. There were no vitals filed for this visit.  GENERAL:alert, no distress and comfortable SKIN: skin color, texture, turgor are normal, no rashes or significant lesions EYES: normal, Conjunctiva are pink and non-injected, sclera clear OROPHARYNX:no exudate, no erythema and lips, buccal mucosa, and tongue normal   NECK: supple, thyroid normal size, non-tender, without nodularity LYMPH:  no palpable lymphadenopathy in the cervical, axillary or inguinal LUNGS: clear to auscultation and percussion with normal breathing effort HEART: regular rate & rhythm and no murmurs and no lower extremity edema ABDOMEN:abdomen soft, non-tender and normal bowel sounds Musculoskeletal:no cyanosis of digits and no clubbing  NEURO: alert & oriented x 3 with fluent speech, no focal motor/sensory deficits  LABORATORY DATA:  I have reviewed the data as listed    Component Value Date/Time   NA 140 04/03/2014 0823   NA 140 01/31/2014 0600   K 4.4 04/03/2014 0823   K 3.6* 01/31/2014 0600   CL 101 01/31/2014 0600   CO2 24 04/03/2014 0823   CO2 29 01/31/2014 0600   GLUCOSE 133 04/03/2014 0823   GLUCOSE 106* 01/31/2014 0600   BUN 9.8 04/03/2014 0823   BUN 12 01/31/2014 0600   CREATININE 0.7 04/03/2014 0823   CREATININE 0.61 01/31/2014 0600   CALCIUM 8.8 04/03/2014 0823   CALCIUM 8.0* 01/31/2014 0600   PROT 5.8* 04/03/2014 0823   PROT 5.2* 01/27/2014 0400  ALBUMIN 3.2* 04/03/2014 0823   ALBUMIN 2.5* 01/27/2014 0400   AST 30 04/03/2014 0823   AST 15 01/27/2014 0400   ALT 27 04/03/2014 0823   ALT 15 01/27/2014 0400   ALKPHOS 124 04/03/2014 0823   ALKPHOS 67 01/27/2014 0400   BILITOT 0.49 04/03/2014 0823   BILITOT 0.4 01/27/2014 0400   GFRNONAA 87* 01/31/2014 0600   GFRAA >90 01/31/2014 0600    No results found for: SPEP, UPEP  Lab Results  Component Value Date   WBC 12.2* 04/03/2014   NEUTROABS 9.3* 04/03/2014   HGB 9.4* 04/03/2014   HCT 28.9* 04/03/2014   MCV 91.9 04/03/2014   PLT 232 04/03/2014      Chemistry      Component Value Date/Time   NA 140 04/03/2014 0823   NA 140 01/31/2014 0600   K 4.4 04/03/2014 0823   K 3.6* 01/31/2014 0600   CL 101 01/31/2014 0600   CO2 24 04/03/2014 0823   CO2 29 01/31/2014 0600   BUN 9.8 04/03/2014 0823   BUN 12 01/31/2014 0600   CREATININE 0.7 04/03/2014  0823   CREATININE 0.61 01/31/2014 0600      Component Value Date/Time   CALCIUM 8.8 04/03/2014 0823   CALCIUM 8.0* 01/31/2014 0600   ALKPHOS 124 04/03/2014 0823   ALKPHOS 67 01/27/2014 0400   AST 30 04/03/2014 0823   AST 15 01/27/2014 0400   ALT 27 04/03/2014 0823   ALT 15 01/27/2014 0400   BILITOT 0.49 04/03/2014 0823   BILITOT 0.4 01/27/2014 0400      ASSESSMENT & PLAN:  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 I plan to give maintenance rituximab in the future for her. She has almost recovered from recent side effects of treatment. 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 The patient denies recent history of bleeding such as epistaxis, hematuria or hematochezia. She is asymptomatic from the anemia. I will observe for now.  I will give her blood if she becomes anemic with hemoglobin less than 8 g.   Chronic atrial fibrillation Today, she is rate controlled. She will continue her cardiac medications.   Diarrhea This could be related to side effects of chemotherapy. I recommend Lomotil. I suspect it will improve over time. I refilled her prescription today.      Bilateral leg edema This has improved. I continue to recommend she hold her diuretic therapy due to recent dehydration.   Protein calorie malnutrition She has lost weight due to dehydration and diarrhea. Overall, she is improving. Continue to encourage her to increase oral intake and supplement.    No orders of the defined types were placed in this encounter.   All questions were answered. The patient knows to call the clinic with any problems, questions or concerns. No barriers to learning was detected. I spent 30 minutes counseling the patient face to face. The total time  spent in the appointment was 40 minutes and more than 50% was on counseling and review of test results     [facility], [place], MD 04/04/2014 8:53 AM

## 2014-04-04 NOTE — Assessment & Plan Note (Signed)
She has lost weight due to dehydration and diarrhea. Overall, she is improving. Continue to encourage her to increase oral intake and supplement.

## 2014-04-04 NOTE — Assessment & Plan Note (Signed)
This could be related to side effects of chemotherapy. I recommend Lomotil. I suspect it will improve over time. I refilled her prescription today.

## 2014-04-05 ENCOUNTER — Ambulatory Visit: Payer: Medicare Other

## 2014-04-09 ENCOUNTER — Ambulatory Visit (HOSPITAL_BASED_OUTPATIENT_CLINIC_OR_DEPARTMENT_OTHER): Payer: Medicare Other

## 2014-04-09 ENCOUNTER — Inpatient Hospital Stay (HOSPITAL_COMMUNITY)
Admission: EM | Admit: 2014-04-09 | Discharge: 2014-04-12 | DRG: 872 | Disposition: A | Discharge status: Home / Self Care | Payer: Medicare Other | Attending: Internal Medicine | Admitting: Internal Medicine

## 2014-04-09 ENCOUNTER — Emergency Department (HOSPITAL_COMMUNITY): Payer: Medicare Other

## 2014-04-09 ENCOUNTER — Other Ambulatory Visit (HOSPITAL_BASED_OUTPATIENT_CLINIC_OR_DEPARTMENT_OTHER): Payer: Medicare Other

## 2014-04-09 ENCOUNTER — Other Ambulatory Visit: Payer: Self-pay | Admitting: *Deleted

## 2014-04-09 ENCOUNTER — Other Ambulatory Visit: Payer: Self-pay

## 2014-04-09 ENCOUNTER — Encounter (HOSPITAL_COMMUNITY): Payer: Self-pay | Admitting: Emergency Medicine

## 2014-04-09 ENCOUNTER — Telehealth: Payer: Self-pay | Admitting: *Deleted

## 2014-04-09 ENCOUNTER — Telehealth: Payer: Self-pay | Admitting: Nurse Practitioner

## 2014-04-09 ENCOUNTER — Ambulatory Visit (HOSPITAL_BASED_OUTPATIENT_CLINIC_OR_DEPARTMENT_OTHER): Payer: Medicare Other | Admitting: Nurse Practitioner

## 2014-04-09 VITALS — BP 128/71 | HR 85 | Temp 99.3°F | Resp 20

## 2014-04-09 DIAGNOSIS — Z7901 Long term (current) use of anticoagulants: Secondary | ICD-10-CM

## 2014-04-09 DIAGNOSIS — E785 Hyperlipidemia, unspecified: Secondary | ICD-10-CM | POA: Diagnosis present

## 2014-04-09 DIAGNOSIS — Z79891 Long term (current) use of opiate analgesic: Secondary | ICD-10-CM | POA: Diagnosis not present

## 2014-04-09 DIAGNOSIS — A419 Sepsis, unspecified organism: Secondary | ICD-10-CM | POA: Diagnosis not present

## 2014-04-09 DIAGNOSIS — C8591 Non-Hodgkin lymphoma, unspecified, lymph nodes of head, face, and neck: Secondary | ICD-10-CM

## 2014-04-09 DIAGNOSIS — Z885 Allergy status to narcotic agent status: Secondary | ICD-10-CM

## 2014-04-09 DIAGNOSIS — I2510 Atherosclerotic heart disease of native coronary artery without angina pectoris: Secondary | ICD-10-CM | POA: Diagnosis present

## 2014-04-09 DIAGNOSIS — M1990 Unspecified osteoarthritis, unspecified site: Secondary | ICD-10-CM | POA: Diagnosis present

## 2014-04-09 DIAGNOSIS — D899 Disorder involving the immune mechanism, unspecified: Secondary | ICD-10-CM | POA: Diagnosis present

## 2014-04-09 DIAGNOSIS — K219 Gastro-esophageal reflux disease without esophagitis: Secondary | ICD-10-CM | POA: Diagnosis present

## 2014-04-09 DIAGNOSIS — H9190 Unspecified hearing loss, unspecified ear: Secondary | ICD-10-CM | POA: Diagnosis present

## 2014-04-09 DIAGNOSIS — R197 Diarrhea, unspecified: Secondary | ICD-10-CM

## 2014-04-09 DIAGNOSIS — E119 Type 2 diabetes mellitus without complications: Secondary | ICD-10-CM | POA: Diagnosis present

## 2014-04-09 DIAGNOSIS — I4891 Unspecified atrial fibrillation: Secondary | ICD-10-CM | POA: Diagnosis not present

## 2014-04-09 DIAGNOSIS — K529 Noninfective gastroenteritis and colitis, unspecified: Secondary | ICD-10-CM | POA: Diagnosis present

## 2014-04-09 DIAGNOSIS — I10 Essential (primary) hypertension: Secondary | ICD-10-CM | POA: Diagnosis present

## 2014-04-09 DIAGNOSIS — G4730 Sleep apnea, unspecified: Secondary | ICD-10-CM | POA: Diagnosis present

## 2014-04-09 DIAGNOSIS — R509 Fever, unspecified: Secondary | ICD-10-CM | POA: Diagnosis not present

## 2014-04-09 DIAGNOSIS — Z803 Family history of malignant neoplasm of breast: Secondary | ICD-10-CM | POA: Diagnosis not present

## 2014-04-09 DIAGNOSIS — N319 Neuromuscular dysfunction of bladder, unspecified: Secondary | ICD-10-CM | POA: Diagnosis present

## 2014-04-09 DIAGNOSIS — R112 Nausea with vomiting, unspecified: Secondary | ICD-10-CM

## 2014-04-09 DIAGNOSIS — Z9221 Personal history of antineoplastic chemotherapy: Secondary | ICD-10-CM

## 2014-04-09 DIAGNOSIS — Z86010 Personal history of colonic polyps: Secondary | ICD-10-CM | POA: Diagnosis not present

## 2014-04-09 DIAGNOSIS — Z882 Allergy status to sulfonamides status: Secondary | ICD-10-CM | POA: Diagnosis not present

## 2014-04-09 DIAGNOSIS — E860 Dehydration: Secondary | ICD-10-CM

## 2014-04-09 DIAGNOSIS — N12 Tubulo-interstitial nephritis, not specified as acute or chronic: Secondary | ICD-10-CM

## 2014-04-09 DIAGNOSIS — J45909 Unspecified asthma, uncomplicated: Secondary | ICD-10-CM | POA: Diagnosis present

## 2014-04-09 DIAGNOSIS — Z91013 Allergy to seafood: Secondary | ICD-10-CM

## 2014-04-09 DIAGNOSIS — Z79899 Other long term (current) drug therapy: Secondary | ICD-10-CM

## 2014-04-09 DIAGNOSIS — D630 Anemia in neoplastic disease: Secondary | ICD-10-CM

## 2014-04-09 DIAGNOSIS — N390 Urinary tract infection, site not specified: Secondary | ICD-10-CM

## 2014-04-09 DIAGNOSIS — Z90710 Acquired absence of both cervix and uterus: Secondary | ICD-10-CM

## 2014-04-09 DIAGNOSIS — Z9049 Acquired absence of other specified parts of digestive tract: Secondary | ICD-10-CM | POA: Diagnosis present

## 2014-04-09 DIAGNOSIS — R319 Hematuria, unspecified: Secondary | ICD-10-CM

## 2014-04-09 DIAGNOSIS — Z8249 Family history of ischemic heart disease and other diseases of the circulatory system: Secondary | ICD-10-CM

## 2014-04-09 DIAGNOSIS — C8590 Non-Hodgkin lymphoma, unspecified, unspecified site: Secondary | ICD-10-CM | POA: Diagnosis present

## 2014-04-09 DIAGNOSIS — Z823 Family history of stroke: Secondary | ICD-10-CM

## 2014-04-09 DIAGNOSIS — Z880 Allergy status to penicillin: Secondary | ICD-10-CM | POA: Diagnosis not present

## 2014-04-09 DIAGNOSIS — D849 Immunodeficiency, unspecified: Secondary | ICD-10-CM

## 2014-04-09 DIAGNOSIS — I482 Chronic atrial fibrillation: Secondary | ICD-10-CM | POA: Diagnosis present

## 2014-04-09 DIAGNOSIS — R7881 Bacteremia: Secondary | ICD-10-CM | POA: Diagnosis not present

## 2014-04-09 DIAGNOSIS — Z833 Family history of diabetes mellitus: Secondary | ICD-10-CM

## 2014-04-09 DIAGNOSIS — E039 Hypothyroidism, unspecified: Secondary | ICD-10-CM | POA: Diagnosis present

## 2014-04-09 DIAGNOSIS — Z888 Allergy status to other drugs, medicaments and biological substances status: Secondary | ICD-10-CM

## 2014-04-09 DIAGNOSIS — E876 Hypokalemia: Secondary | ICD-10-CM | POA: Diagnosis present

## 2014-04-09 DIAGNOSIS — A4151 Sepsis due to Escherichia coli [E. coli]: Secondary | ICD-10-CM | POA: Diagnosis present

## 2014-04-09 DIAGNOSIS — R1110 Vomiting, unspecified: Secondary | ICD-10-CM | POA: Diagnosis not present

## 2014-04-09 DIAGNOSIS — C8580 Other specified types of non-Hodgkin lymphoma, unspecified site: Secondary | ICD-10-CM | POA: Diagnosis not present

## 2014-04-09 LAB — COMPREHENSIVE METABOLIC PANEL (CC13)
ALT: 24 U/L (ref 0–55)
ANION GAP: 9 meq/L (ref 3–11)
AST: 28 U/L (ref 5–34)
Albumin: 3.3 g/dL — ABNORMAL LOW (ref 3.5–5.0)
Alkaline Phosphatase: 100 U/L (ref 40–150)
BILIRUBIN TOTAL: 0.43 mg/dL (ref 0.20–1.20)
BUN: 9.9 mg/dL (ref 7.0–26.0)
CALCIUM: 9.2 mg/dL (ref 8.4–10.4)
CHLORIDE: 105 meq/L (ref 98–109)
CO2: 24 meq/L (ref 22–29)
CREATININE: 0.7 mg/dL (ref 0.6–1.1)
EGFR: 82 mL/min/{1.73_m2} — AB (ref >90)
GLUCOSE: 144 mg/dL — AB (ref 70–140)
Potassium: 4.3 mEq/L (ref 3.5–5.1)
Sodium: 138 mEq/L (ref 136–145)
Total Protein: 5.7 g/dL — ABNORMAL LOW (ref 6.4–8.3)

## 2014-04-09 LAB — I-STAT CG4 LACTIC ACID, ED
LACTIC ACID, VENOUS: 2.65 mmol/L — AB (ref 0.5–2.0)
Lactic Acid, Venous: 2.61 mmol/L (ref 0.5–2.0)

## 2014-04-09 LAB — APTT: aPTT: 38 seconds — ABNORMAL HIGH (ref 24–37)

## 2014-04-09 LAB — COMPREHENSIVE METABOLIC PANEL
ALT: 26 U/L (ref 0–35)
ALT: 26 U/L (ref 0–35)
AST: 42 U/L — ABNORMAL HIGH (ref 0–37)
AST: 49 U/L — AB (ref 0–37)
Albumin: 3.4 g/dL — ABNORMAL LOW (ref 3.5–5.2)
Albumin: 2.6 g/dL — ABNORMAL LOW (ref 3.5–5.2)
Alkaline Phosphatase: 94 U/L (ref 39–117)
Alkaline Phosphatase: 70 U/L (ref 39–117)
Anion gap: 11 (ref 5–15)
Anion gap: 8 (ref 5–15)
BUN: 10 mg/dL (ref 6–23)
BUN: 11 mg/dL (ref 6–23)
CALCIUM: 7.6 mg/dL — AB (ref 8.4–10.5)
CHLORIDE: 105 mmol/L (ref 96–112)
CO2: 23 mmol/L (ref 19–32)
CO2: 21 mmol/L (ref 19–32)
Calcium: 8.7 mg/dL (ref 8.4–10.5)
Chloride: 103 mmol/L (ref 96–112)
Creatinine, Ser: 0.57 mg/dL (ref 0.50–1.10)
Creatinine, Ser: 0.59 mg/dL (ref 0.50–1.10)
GFR calc Af Amer: >90 mL/min (ref >90)
GFR calc Af Amer: >90 mL/min (ref >90)
GFR calc non Af Amer: 88 mL/min — ABNORMAL LOW (ref >90)
GFR, EST NON AFRICAN AMERICAN: 87 mL/min — AB (ref >90)
Glucose, Bld: 149 mg/dL — ABNORMAL HIGH (ref 70–99)
Glucose, Bld: 144 mg/dL — ABNORMAL HIGH (ref 70–99)
POTASSIUM: 3.7 mmol/L (ref 3.5–5.1)
Potassium: 3.7 mmol/L (ref 3.5–5.1)
SODIUM: 137 mmol/L (ref 135–145)
SODIUM: 134 mmol/L — AB (ref 135–145)
TOTAL PROTEIN: 6.1 g/dL (ref 6.0–8.3)
Total Bilirubin: 0.8 mg/dL (ref 0.3–1.2)
Total Bilirubin: 1 mg/dL (ref 0.3–1.2)
Total Protein: 4.9 g/dL — ABNORMAL LOW (ref 6.0–8.3)

## 2014-04-09 LAB — CBC WITH DIFFERENTIAL/PLATELET
BASO%: 0.2 % (ref 0.0–2.0)
BASOS PCT: 0 % (ref 0–1)
Basophils Absolute: 0 10*3/uL (ref 0.0–0.1)
Basophils Absolute: 0 10*3/uL (ref 0.0–0.1)
Basophils Absolute: 0 10*3/uL (ref 0.0–0.1)
Basophils Relative: 0 % (ref 0–1)
EOS PCT: 0 % (ref 0–5)
EOS%: 1.1 % (ref 0.0–7.0)
Eosinophils Absolute: 0.2 10*3/uL (ref 0.0–0.5)
Eosinophils Absolute: 0 10*3/uL (ref 0.0–0.7)
Eosinophils Absolute: 0 10*3/uL (ref 0.0–0.7)
Eosinophils Relative: 0 % (ref 0–5)
HCT: 28.9 % — ABNORMAL LOW (ref 36.0–46.0)
HCT: 25.4 % — ABNORMAL LOW (ref 36.0–46.0)
HEMATOCRIT: 29.3 % — AB (ref 34.8–46.6)
HGB: 9.6 g/dL — ABNORMAL LOW (ref 11.6–15.9)
Hemoglobin: 9.6 g/dL — ABNORMAL LOW (ref 12.0–15.0)
Hemoglobin: 8.3 g/dL — ABNORMAL LOW (ref 12.0–15.0)
LYMPH%: 7.5 % — AB (ref 14.0–49.7)
LYMPHS ABS: 0.5 10*3/uL — AB (ref 0.7–4.0)
LYMPHS ABS: 1 10*3/uL (ref 0.7–4.0)
Lymphocytes Relative: 4 % — ABNORMAL LOW (ref 12–46)
Lymphocytes Relative: 5 % — ABNORMAL LOW (ref 12–46)
MCH: 30.4 pg (ref 25.1–34.0)
MCH: 31.1 pg (ref 26.0–34.0)
MCH: 30.6 pg (ref 26.0–34.0)
MCHC: 32.8 g/dL (ref 31.5–36.0)
MCHC: 33.2 g/dL (ref 30.0–36.0)
MCHC: 32.7 g/dL (ref 30.0–36.0)
MCV: 92.7 fL (ref 79.5–101.0)
MCV: 93.5 fL (ref 78.0–100.0)
MCV: 93.7 fL (ref 78.0–100.0)
MONO ABS: 1.7 10*3/uL — AB (ref 0.1–1.0)
MONO#: 1 10*3/uL — AB (ref 0.1–0.9)
MONO%: 6.8 % (ref 0.0–14.0)
Monocytes Absolute: 0.5 10*3/uL (ref 0.1–1.0)
Monocytes Relative: 4 % (ref 3–12)
Monocytes Relative: 9 % (ref 3–12)
NEUT#: 11.7 10*3/uL — ABNORMAL HIGH (ref 1.5–6.5)
NEUT%: 84.4 % — ABNORMAL HIGH (ref 38.4–76.8)
NEUTROS PCT: 86 % — AB (ref 43–77)
Neutro Abs: 13.1 10*3/uL — ABNORMAL HIGH (ref 1.7–7.7)
Neutro Abs: 16.5 10*3/uL — ABNORMAL HIGH (ref 1.7–7.7)
Neutrophils Relative %: 92 % — ABNORMAL HIGH (ref 43–77)
PLATELETS: 232 10*3/uL (ref 145–400)
PLATELETS: 225 10*3/uL (ref 150–400)
Platelets: 182 10*3/uL (ref 150–400)
RBC: 3.16 10*6/uL — ABNORMAL LOW (ref 3.70–5.45)
RBC: 3.09 MIL/uL — AB (ref 3.87–5.11)
RBC: 2.71 MIL/uL — ABNORMAL LOW (ref 3.87–5.11)
RDW: 17.3 % — ABNORMAL HIGH (ref 11.2–14.5)
RDW: 17.3 % — ABNORMAL HIGH (ref 11.5–15.5)
RDW: 17.5 % — AB (ref 11.5–15.5)
WBC: 13.9 10*3/uL — ABNORMAL HIGH (ref 3.9–10.3)
WBC: 14.2 10*3/uL — AB (ref 4.0–10.5)
WBC: 19.2 10*3/uL — ABNORMAL HIGH (ref 4.0–10.5)
lymph#: 1.1 10*3/uL (ref 0.9–3.3)

## 2014-04-09 LAB — URINALYSIS, MICROSCOPIC - CHCC
Bilirubin (Urine): NEGATIVE
GLUCOSE UR CHCC: NEGATIVE mg/dL
KETONES: NEGATIVE mg/dL
Nitrite: NEGATIVE
Protein: 30 mg/dL
SPECIFIC GRAVITY, URINE: 1.01 (ref 1.003–1.035)
Urobilinogen, UR: 0.2 mg/dL (ref 0.2–1)
pH: 7.5 (ref 4.6–8.0)

## 2014-04-09 LAB — LACTIC ACID, PLASMA: Lactic Acid, Venous: 1.6 mmol/L (ref 0.5–2.0)

## 2014-04-09 LAB — URINALYSIS, ROUTINE W REFLEX MICROSCOPIC
Bilirubin Urine: NEGATIVE
GLUCOSE, UA: NEGATIVE mg/dL
Ketones, ur: NEGATIVE mg/dL
Nitrite: NEGATIVE
Protein, ur: NEGATIVE mg/dL
SPECIFIC GRAVITY, URINE: 1.013 (ref 1.005–1.030)
Urobilinogen, UA: 1 mg/dL (ref 0.0–1.0)
pH: 6.5 (ref 5.0–8.0)

## 2014-04-09 LAB — PROTIME-INR
INR: 1.41 (ref 0.00–1.49)
INR: 1.61 — AB (ref 0.00–1.49)
PROTHROMBIN TIME: 17.4 s — AB (ref 11.6–15.2)
PROTHROMBIN TIME: 19.3 s — AB (ref 11.6–15.2)

## 2014-04-09 LAB — PROCALCITONIN: Procalcitonin: 4.61 ng/mL

## 2014-04-09 LAB — URINE MICROSCOPIC-ADD ON

## 2014-04-09 LAB — HOLD TUBE, BLOOD BANK

## 2014-04-09 LAB — DIGOXIN LEVEL: Digoxin Level: 0.7 ng/mL — ABNORMAL LOW (ref 0.8–2.0)

## 2014-04-09 LAB — TROPONIN I: Troponin I: 0.03 ng/mL (ref <0.031)

## 2014-04-09 LAB — LIPASE, BLOOD: Lipase: 12 U/L (ref 11–59)

## 2014-04-09 MED ORDER — ACETAMINOPHEN 325 MG PO TABS
650.0000 mg | ORAL_TABLET | Freq: Once | ORAL | Status: AC
  Administered 2014-04-09: 650 mg via ORAL
  Filled 2014-04-09: qty 2

## 2014-04-09 MED ORDER — PROCHLORPERAZINE MALEATE 10 MG PO TABS
10.0000 mg | ORAL_TABLET | Freq: Three times a day (TID) | ORAL | Status: DC | PRN
  Administered 2014-04-10 – 2014-04-12 (×4): 10 mg via ORAL
  Filled 2014-04-09 (×4): qty 1

## 2014-04-09 MED ORDER — LEVOFLOXACIN IN D5W 750 MG/150ML IV SOLN
750.0000 mg | INTRAVENOUS | Status: DC
  Administered 2014-04-10 – 2014-04-11 (×2): 750 mg via INTRAVENOUS
  Filled 2014-04-09 (×2): qty 150

## 2014-04-09 MED ORDER — WARFARIN - PHARMACIST DOSING INPATIENT: Freq: Every day | Status: DC

## 2014-04-09 MED ORDER — SODIUM CHLORIDE 0.9 % IV SOLN: INTRAVENOUS | Status: AC

## 2014-04-09 MED ORDER — ONDANSETRON 8 MG/50ML IVPB (CHCC): 8.0000 mg | Freq: Once | INTRAVENOUS | Status: DC

## 2014-04-09 MED ORDER — ALBUTEROL SULFATE HFA 108 (90 BASE) MCG/ACT IN AERS: 2.0000 | INHALATION_SPRAY | Freq: Four times a day (QID) | RESPIRATORY_TRACT | Status: DC | PRN

## 2014-04-09 MED ORDER — SODIUM CHLORIDE 0.9 % IV SOLN
Freq: Once | INTRAVENOUS | Status: AC
  Administered 2014-04-09: 15:00:00 via INTRAVENOUS

## 2014-04-09 MED ORDER — DIGOXIN 125 MCG PO TABS
125.0000 ug | ORAL_TABLET | Freq: Every day | ORAL | Status: DC
  Administered 2014-04-10 – 2014-04-12 (×3): 125 ug via ORAL
  Filled 2014-04-09 (×3): qty 1

## 2014-04-09 MED ORDER — MOMETASONE FURO-FORMOTEROL FUM 100-5 MCG/ACT IN AERO
2.0000 | INHALATION_SPRAY | Freq: Two times a day (BID) | RESPIRATORY_TRACT | Status: DC
  Administered 2014-04-10 – 2014-04-12 (×6): 2 via RESPIRATORY_TRACT
  Filled 2014-04-09 (×2): qty 8.8

## 2014-04-09 MED ORDER — METRONIDAZOLE IN NACL 5-0.79 MG/ML-% IV SOLN
500.0000 mg | Freq: Three times a day (TID) | INTRAVENOUS | Status: DC
  Administered 2014-04-10 – 2014-04-11 (×4): 500 mg via INTRAVENOUS
  Filled 2014-04-09 (×4): qty 100

## 2014-04-09 MED ORDER — ONDANSETRON HCL 4 MG PO TABS: 4.0000 mg | ORAL_TABLET | Freq: Four times a day (QID) | ORAL | Status: DC | PRN

## 2014-04-09 MED ORDER — WARFARIN SODIUM 5 MG PO TABS
5.0000 mg | ORAL_TABLET | ORAL | Status: AC
  Administered 2014-04-10: 5 mg via ORAL
  Filled 2014-04-09 (×2): qty 1

## 2014-04-09 MED ORDER — SODIUM CHLORIDE 0.9 % IV BOLUS (SEPSIS)
1000.0000 mL | Freq: Once | INTRAVENOUS | Status: AC
  Administered 2014-04-09: 1000 mL via INTRAVENOUS

## 2014-04-09 MED ORDER — LEVOTHYROXINE SODIUM 125 MCG PO TABS
250.0000 ug | ORAL_TABLET | Freq: Every day | ORAL | Status: DC
  Administered 2014-04-10 – 2014-04-12 (×3): 250 ug via ORAL
  Filled 2014-04-09 (×5): qty 2

## 2014-04-09 MED ORDER — DEXTROSE 5 % IV SOLN
2.0000 g | Freq: Once | INTRAVENOUS | Status: AC
  Administered 2014-04-09: 2 g via INTRAVENOUS
  Filled 2014-04-09: qty 2

## 2014-04-09 MED ORDER — DEXTROSE 5 % IV SOLN
2.0000 g | Freq: Three times a day (TID) | INTRAVENOUS | Status: DC
  Administered 2014-04-10: 2 g via INTRAVENOUS
  Filled 2014-04-09 (×3): qty 2

## 2014-04-09 MED ORDER — LEVOFLOXACIN IN D5W 750 MG/150ML IV SOLN: 750.0000 mg | Freq: Once | INTRAVENOUS | Status: DC

## 2014-04-09 MED ORDER — LEVOFLOXACIN IN D5W 750 MG/150ML IV SOLN
750.0000 mg | Freq: Once | INTRAVENOUS | Status: AC
  Administered 2014-04-09: 750 mg via INTRAVENOUS
  Filled 2014-04-09: qty 150

## 2014-04-09 MED ORDER — SODIUM CHLORIDE 0.9 % IV BOLUS (SEPSIS)
1000.0000 mL | Freq: Once | INTRAVENOUS | Status: AC
Start: 2014-04-09 — End: 2014-04-09
  Administered 2014-04-09: 1000 mL via INTRAVENOUS

## 2014-04-09 MED ORDER — VANCOMYCIN HCL 500 MG IV SOLR
500.0000 mg | Freq: Two times a day (BID) | INTRAVENOUS | Status: DC
  Filled 2014-04-09: qty 500

## 2014-04-09 MED ORDER — SODIUM CHLORIDE 0.9 % IJ SOLN
10.0000 mL | INTRAMUSCULAR | Status: DC | PRN
  Filled 2014-04-09: qty 10

## 2014-04-09 MED ORDER — HEPARIN SOD (PORK) LOCK FLUSH 100 UNIT/ML IV SOLN
500.0000 [IU] | Freq: Once | INTRAVENOUS | Status: AC | PRN
  Filled 2014-04-09: qty 5

## 2014-04-09 MED ORDER — VANCOMYCIN HCL IN DEXTROSE 1-5 GM/200ML-% IV SOLN
1000.0000 mg | Freq: Once | INTRAVENOUS | Status: AC
  Administered 2014-04-09: 1000 mg via INTRAVENOUS
  Filled 2014-04-09: qty 200

## 2014-04-09 MED ORDER — DILTIAZEM HCL ER COATED BEADS 120 MG PO CP24
120.0000 mg | ORAL_CAPSULE | Freq: Every day | ORAL | Status: DC
  Administered 2014-04-10 – 2014-04-12 (×3): 120 mg via ORAL
  Filled 2014-04-09 (×3): qty 1

## 2014-04-09 MED ORDER — ONDANSETRON 8 MG/50ML IVPB (CHCC)
8.0000 mg | Freq: Once | INTRAVENOUS | Status: AC
  Administered 2014-04-09: 8 mg via INTRAVENOUS

## 2014-04-09 MED ORDER — METRONIDAZOLE IN NACL 5-0.79 MG/ML-% IV SOLN
500.0000 mg | Freq: Once | INTRAVENOUS | Status: AC
  Administered 2014-04-09: 500 mg via INTRAVENOUS
  Filled 2014-04-09: qty 100

## 2014-04-09 MED ORDER — SODIUM CHLORIDE 0.9 % IV SOLN
INTRAVENOUS | Status: DC
  Administered 2014-04-09 – 2014-04-11 (×4): via INTRAVENOUS

## 2014-04-09 MED ORDER — INSULIN ASPART 100 UNIT/ML ~~LOC~~ SOLN
0.0000 [IU] | SUBCUTANEOUS | Status: DC
  Administered 2014-04-10: 1 [IU] via SUBCUTANEOUS
  Administered 2014-04-10: 3 [IU] via SUBCUTANEOUS
  Administered 2014-04-10: 1 [IU] via SUBCUTANEOUS

## 2014-04-09 MED ORDER — ALBUTEROL SULFATE (2.5 MG/3ML) 0.083% IN NEBU: 2.5000 mg | INHALATION_SOLUTION | Freq: Four times a day (QID) | RESPIRATORY_TRACT | Status: DC | PRN

## 2014-04-09 MED ORDER — SODIUM CHLORIDE 0.9 % IJ SOLN
3.0000 mL | Freq: Two times a day (BID) | INTRAMUSCULAR | Status: DC
  Administered 2014-04-10 – 2014-04-11 (×2): 3 mL via INTRAVENOUS

## 2014-04-09 MED ORDER — ONDANSETRON HCL 4 MG/2ML IJ SOLN
4.0000 mg | Freq: Four times a day (QID) | INTRAMUSCULAR | Status: DC | PRN
  Administered 2014-04-10 – 2014-04-12 (×5): 4 mg via INTRAVENOUS
  Filled 2014-04-09 (×5): qty 2

## 2014-04-09 MED ORDER — IOHEXOL 300 MG/ML  SOLN
100.0000 mL | Freq: Once | INTRAMUSCULAR | Status: AC | PRN
  Administered 2014-04-09: 100 mL via INTRAVENOUS

## 2014-04-09 NOTE — ED Notes (Signed)
Brought in from [facility] with c/o N/V/D.  Pt was visiting clinic for symptoms treatment but N/V/D persist that pt reported getting progressively weak.  Pt has had chemo treatment last 04/03/2014.  Pt started having N/V/D yesterday.  Pt was given NS 750 ml bolus and Zofran 4 mg IV at the cancer center.  Pt arrived to ED weak-looking, with active emesis and diarrhea.

## 2014-04-09 NOTE — ED Notes (Signed)
Bed: WA21 Expected date:  Expected time:  Means of arrival:  Comments: [place] patient- N,V, D

## 2014-04-09 NOTE — H&P (Signed)
Hospitalist Admission History and Physical  Patient name: Patty [name] Medical record number: [id number] Date of birth: 10-Jul-1940 Age: 74 y.o. Gender: female  Primary Care Provider: Horatio Pel, MD  Chief Complaint: sepsis, colitis, pyelonephritis   History of Present Illness:This is a 74 y.o. year old female with significant past medical history of lymphoma on chemotherapy, atrial fibrillation on coumadin, type 2 DM, hx/o septic shock, hx/o ecoli bacteremia  presenting with sepsis, colitis, pyelonephritis. Pt with worsening nausea, vomiting, diarrhea, abd pain over past 24 hours. Has been taking at home compazine w/ minimal improvement in sxs. No fevers or chills. Contacted her oncologist about sxs. Came to the ER because of worsening sxs.  Presents to the ER temperature 101.4, heart rate in the 60s to 110s, respirations in the tens to 20s, blood pressure in the 90s to 170s. Mainly in the 90s. Satting in the mid 90s on room air. White blood cell count 14.2, hemoglobin 19.6, creatinine 0.57. Lipase within normal limits. Lactate around 2.6. Urinalysis indicative of infection. Chest x-ray within normal limits. CT abd pel Heterogeneous renal nephrograms suggest possible pyelonephritis. Gas in the bladder may be due to infection. Pericolonic wall thickening suggest colitis. Small amount of free fluid in the abdomen. Noted multiple tiny lesions in spleen-? Lymphoma vs. Infection.   Assessment and Plan:  Active Problems:   Sepsis   Colitis   Pyelonephritis   1- Sepsis -meets criteria by temp, HR, WBC -infectious sources include pyelonephritis, colitis -IV levaquin and flagyl  -pan culture -C diff -LLN BPs  -IVFs-BPs slowly improving   -trend lactate -stepdown bed -low treshold for PCCM consult   2- Colitis -as above -IV levaquin and flagyl  -stool studies -follow  3- Pyelonephritis -as above -levaquin  -urine culture  4-Lymphoma -on chemotx -H-O aware of case  per EDP -f/u recs in am   5-atrial fibrillation -rate controlled on presentation -cont dilt and dig -coumadin per pharmacy  -tele bed -cards c/s as clinically indicated  6-DM -SSI  -A1C  -hold orals    FEN/GI: NPO  Prophylaxis: coumadin Disposition: pending further evaluation  Code Status:CPR only    Patient Active Problem List   Diagnosis Date Noted  . Dehydration 04/09/2014  . Sepsis 04/09/2014  . Bilateral leg edema 03/21/2014  . Leukocytosis 03/14/2014  . Protein calorie malnutrition 02/13/2014  . Metabolic acidosis 01/75/1025  . Mucositis due to chemotherapy 01/21/2014  . Thrombocytopenia 01/19/2014  . Diarrhea 01/19/2014  . At high risk of tumor lysis syndrome 01/08/2014  . 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 Diabetes mellitus type 2, controlled, without complications   . Dyspnea on exertion 01/27/2011  . DI (detrusor instability)   . Fibroid   . Rectocele   . Atrophic vaginitis   . Sleep apnea   . CAD (coronary atherosclerotic disease) 07/14/2010  . Hyperlipemia 06/15/2010  . Hypothyroidism 06/15/2010  . Asthma 06/15/2010  . Arthritis 06/15/2010  . Long-term (current) use of anticoagulants 06/15/2010  . Chronic atrial fibrillation 05/28/2010   Past Medical History: Past Medical History  Diagnosis Date  . Asthma 06/15/2010  . Hypothyroidism 06/15/2010  . Hyperlipemia 06/15/2010  . Atrial fibrillation 05/28/2010    Managed with rate control and coumadin  . Long-term (current) use of anticoagulants   . ACE-inhibitor cough   . DI (detrusor instability)   . Fibroid   . Rectocele   . Atrophic vaginitis   . Cancer  2008    Colon polyp-early adenoCA  . Diabetes mellitus     Type 2  . Peripheral neuropathy   . Hypertension   . Dysrhythmia     cardioversion - 2012  . Complication of anesthesia 1980's    post anesth.- states she "went into resp. arrest" , but then remarked  that she thought maybe they gave her too much medicine (morphine)   . Shortness of breath     sometimes   . 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 Anemia   . 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 CPAP (continuous positive airway pressure) dependence   . GERD (gastroesophageal reflux disease)   . Hearing loss   . Sleep apnea     uses CPAP everynight- last study in [place] 5 yrs. or more   . Severe sepsis with septic shock 01/21/2014  . E coli bacteremia 01/20/2014    Past Surgical History: Past Surgical History  Procedure Laterality Date  . Appendectomy  1953  . Partial hysterectomy  1972  . Cholecystectomy  1993  . Back surgery      x2  . Cataract extraction    . Cardiac catheterization  10/10/2008    Nonobstructive CAD  . Abdominal hysterectomy  1972    TAH  . Foot surgery    . Tonsillectomy    . Pubovag repair with sling    . Skin tag removal  2012    leg  . Cardioversion  04/13/2011    Procedure: CARDIOVERSION;  Surgeon: Laverda Page, MD;  Location: [facility];  Service: Cardiovascular;  Laterality: N/A;  . Eye surgery      /w IOL, post cataracts removed   . Mass biopsy Left 12/07/2013    Procedure: EXCISIONAL BIOPSY LEFT NECK MASS ;  Surgeon: Jerrell Belfast, MD;  Location[facility] OR;  Service: ENT;  Laterality: Left;    Social History: History   Social History  . Marital Status: Married    Spouse Name: N/A    Number of Children: 2  . Years of Education: N/A   Social History Main Topics  . Smoking status: Never Smoker   . Smokeless tobacco: Never Used  . Alcohol Use: No  . Drug Use: No  . Sexual Activity: Not Currently    Birth Control/ Protection: Surgical   Other Topics Concern  . None   Social History Narrative    Family History: Family History  Problem Relation Age of Onset  . Stroke Father     at 26  . Heart attack Father     at 12  . Hypertension Father   . Heart disease Mother   . Arthritis Mother   . Breast cancer Mother     Age 33  .  Heart failure Mother   . Heart attack Brother   . Hypertension Brother   . Diabetes Sister     Allergies: Allergies  Allergen Reactions  . Morphine And Related Anaphylaxis and Other (See Comments)    Pt states she stopped breathing post op- "went into resp. arrest"  . Demerol Nausea And Vomiting  . Meperidine Hcl Nausea And Vomiting  . Multaq [Dronedarone] Other (See Comments)    Reaction:  Blood in urine and elevated liver enzymes.   Jeanie Cooks Allergy] Rash  . Penicillins Itching and Rash  . Sulfa Drugs Cross Reactors Rash    Current Facility-Administered Medications  Medication Dose Route Frequency Provider Last Rate Last Dose  . 0.9 %  sodium chloride infusion   Intravenous Continuous Shanda Howells, MD      . albuterol (PROVENTIL HFA;VENTOLIN HFA) 108 (90 BASE) MCG/ACT inhaler 2 puff  2 puff Inhalation Q6H PRN Shanda Howells, MD      . Derrill Memo ON 04/10/2014] aztreonam (AZACTAM) 2 g in dextrose 5 % 50 mL IVPB  2 g Intravenous Q8H Leann Trefz Poindexter, RPH      . [START ON 04/10/2014] digoxin (LANOXIN) tablet 125 mcg  125 mcg Oral Daily Shanda Howells, MD      . Derrill Memo ON 04/10/2014] diltiazem (CARDIZEM CD) 24 hr capsule 120 mg  120 mg Oral Daily Shanda Howells, MD      . Derrill Memo ON 04/10/2014] levofloxacin (LEVAQUIN) IVPB 750 mg  750 mg Intravenous Q24H Leann Trefz Poindexter, RPH      . levofloxacin (LEVAQUIN) IVPB 750 mg  750 mg Intravenous Once Shanda Howells, MD      . Derrill Memo ON 04/10/2014] levothyroxine (SYNTHROID, LEVOTHROID) tablet 250 mcg  250 mcg Oral QAC breakfast Shanda Howells, MD      . metroNIDAZOLE (FLAGYL) IVPB 500 mg  500 mg Intravenous Once Shanda Howells, MD      . mometasone-formoterol [facility]) 100-5 MCG/ACT inhaler 2 puff  2 puff Inhalation BID Shanda Howells, MD      . ondansetron [facility]) tablet 4 mg  4 mg Oral Q6H PRN Shanda Howells, MD       Or  . ondansetron [facility]) injection 4 mg  4 mg Intravenous Q6H PRN Shanda Howells, MD      . prochlorperazine  (COMPAZINE) tablet 10 mg  10 mg Oral Q8H PRN Shanda Howells, MD      . sodium chloride 0.9 % injection 3 mL  3 mL Intravenous Q12H Shanda Howells, MD      . Derrill Memo ON 04/10/2014] vancomycin (VANCOCIN) 500 mg in sodium chloride 0.9 % 100 mL IVPB  500 mg Intravenous Q12H Leann Trefz Poindexter, [facility]       Current Outpatient Prescriptions  Medication Sig Dispense Refill  . acetaminophen (TYLENOL) 500 MG tablet Take 1,000 mg by mouth every 6 (six) hours as needed for mild pain (pain).     . Cholecalciferol (VITAMIN D) 2000 UNITS tablet Take 2,000 Units by mouth daily with lunch.    . digoxin (LANOXIN) 0.125 MG tablet Take 125 mcg by mouth daily.     Marland Kitchen diltiazem (CARDIZEM CD) 120 MG 24 hr capsule Take 120 mg by mouth daily.     . diphenhydramine-acetaminophen (TYLENOL PM) 25-500 MG TABS Take 1 tablet by mouth at bedtime as needed (sleep).    . diphenoxylate-atropine (LOMOTIL) 2.5-0.025 MG per tablet Take 1 tablet by mouth 4 (four) times daily as needed for diarrhea or loose stools. 90 tablet 0  . ezetimibe (ZETIA) 10 MG tablet Take 10 mg by mouth at bedtime.     . Fluticasone-Salmeterol (ADVAIR) 250-50 MCG/DOSE AEPB Inhale 1 puff into the lungs every 12 (twelve) hours.     Marland Kitchen levothyroxine (SYNTHROID, LEVOTHROID) 125 MCG tablet Take 250 mcg by mouth daily before breakfast.     . lidocaine-prilocaine (EMLA) cream Apply 1 application topically as needed. Apply to [facility] a Cath site at least one hour prior to needle stick as needed. 30 g 3  . Magnesium Oxide 500 MG TABS Take 500 mg by mouth at bedtime.    . metFORMIN (GLUCOPHAGE-XR) 500 MG 24 hr tablet Take 500 mg by mouth 2 (two) times daily with a meal.    . montelukast (  SINGULAIR) 10 MG tablet Take 10 mg by mouth daily.    Marland Kitchen omega-3 acid ethyl esters (LOVAZA) 1 G capsule Take 1 g by mouth 2 (two) times daily.    . ondansetron (ZOFRAN) 8 MG tablet Take 8 mg by mouth every 8 (eight) hours as needed for nausea or vomiting (nausea).     . potassium chloride SA  (K-DUR,KLOR-CON) 20 MEQ tablet Take 1 tablet (20 mEq total) by mouth daily. 30 tablet 5  . pravastatin (PRAVACHOL) 40 MG tablet Take 20 mg by mouth See admin instructions. Take 1/2 tablet (20 mg) ONLY on Monday, Tuesday, Wednesday, and Thursday Nights.    . prochlorperazine (COMPAZINE) 10 MG tablet Take 10 mg by mouth every 8 (eight) hours as needed for nausea (nausea).     . warfarin (COUMADIN) 5 MG tablet Take 2.5-5 mg by mouth daily. Take 1/2 tablet (2.5 mg) on (Sun, Tues, Thurs, Sat) and Take 1 tablet (5 mg) on (Mon, Wed, Fri)    . albuterol (PROAIR HFA) 108 (90 BASE) MCG/ACT inhaler Inhale 2 puffs into the lungs every 6 (six) hours as needed for wheezing or shortness of breath.    . conjugated estrogens (PREMARIN) vaginal cream Place 1 Applicatorful vaginally 3 (three) times a week. Monday, Wednesday, and Friday nights    . traMADol-acetaminophen (ULTRACET) 37.5-325 MG per tablet Take 1 tablet by mouth every 6 (six) hours as needed. 30 tablet 0   Facility-Administered Medications Ordered in Other Encounters  Medication Dose Route Frequency Provider Last Rate Last Dose  . heparin lock flush 100 unit/mL  500 Units Intracatheter Once PRN Heath Lark, MD      . ondansetron (ZOFRAN) IVPB 8 mg  8 mg Intravenous Once Drue Second, NP   8 mg at 04/09/14 1506  . sodium chloride 0.9 % injection 10 mL  10 mL Intracatheter PRN Heath Lark, MD       Review Of Systems: 12 point ROS negative except as noted above in HPI.  Physical Exam: Filed Vitals:   04/09/14 2202  BP: 100/43  Pulse: 80  Temp:   Resp: 18    General: alert and cooperative HEENT: PERRLA and extra ocular movement intact Heart: S1, S2 normal, no murmur, rub or gallop, regular rate and rhythm Lungs: clear to auscultation, no wheezes or rales and unlabored breathing Abdomen: + bowel sounds, + LLQ TTP Extremities: extremities normal, atraumatic, no cyanosis or edema Skin:no rashes Neurology: normal without focal findings  Labs and  Imaging: Lab Results  Component Value Date/Time   NA 137 04/09/2014 05:47 PM   NA 138 04/09/2014 01:28 PM   K 3.7 04/09/2014 05:47 PM   K 4.3 04/09/2014 01:28 PM   CL 103 04/09/2014 05:47 PM   CO2 23 04/09/2014 05:47 PM   CO2 24 04/09/2014 01:28 PM   BUN 10 04/09/2014 05:47 PM   BUN 9.9 04/09/2014 01:28 PM   CREATININE 0.57 04/09/2014 05:47 PM   CREATININE 0.7 04/09/2014 01:28 PM   GLUCOSE 149* 04/09/2014 05:47 PM   GLUCOSE 144* 04/09/2014 01:28 PM   Lab Results  Component Value Date   WBC 14.2* 04/09/2014   HGB 9.6* 04/09/2014   HCT 28.9* 04/09/2014   MCV 93.5 04/09/2014   PLT 225 04/09/2014   Urinalysis    Component Value Date/Time   COLORURINE YELLOW 04/09/2014 1932   APPEARANCEUR CLOUDY* 04/09/2014 1932   LABSPEC 1.013 04/09/2014 1932   LABSPEC 1.010 04/09/2014 1327   PHURINE 6.5 04/09/2014 1932   GLUCOSEU NEGATIVE  04/09/2014 1932   GLUCOSEU Negative 04/09/2014 1327   HGBUR TRACE* 04/09/2014 [place] 04/09/2014 1932   KETONESUR NEGATIVE 04/09/2014 1932   PROTEINUR NEGATIVE 04/09/2014 1932   UROBILINOGEN 1.0 04/09/2014 1932   UROBILINOGEN 0.2 04/09/2014 1327   NITRITE NEGATIVE 04/09/2014 1932   LEUKOCYTESUR MODERATE* 04/09/2014 1932      Ct Abdomen Pelvis W Contrast  04/09/2014   CLINICAL DATA:  Malignant lymphoma. Last chemotherapy 04/03/2014. Now complains of chills, weakness, nausea, vomiting, diarrhea. History of appendectomy, hysterectomy, cholecystectomy.  EXAM: CT ABDOMEN AND PELVIS WITH CONTRAST  TECHNIQUE: Multidetector CT imaging of the abdomen and pelvis was performed using the standard protocol following bolus administration of intravenous contrast.  CONTRAST:  113mL OMNIPAQUE IOHEXOL 300 MG/ML  SOLN  COMPARISON:  PET-CT 02/26/2014.  CT abdomen and pelvis 07/01/2010.  FINDINGS: Atelectasis or infiltration in both lung bases. Mild cardiac enlargement.  Diffuse fatty infiltration of the liver. Surgical absence of the gallbladder. No bile  duct dilatation. Pancreas is atrophic. Multiple tiny poorly defined low-attenuation lesions in the spleen. This could be related to lymphoma or infections. Soft tissue filling with poor definition of fat around the celiac axis. No discrete lymph node is demonstrated. This is likely related to lymphoma. Small amount of free fluid/ edema in the upper abdomen and mesenteric. This is been present previously and is likely reactive or inflammatory. Scattered mesenteric and retroperitoneal lymph nodes are not pathologically enlarged. Calcification of the aorta without aneurysm. The inferior vena cava is unremarkable. Parapelvic cysts on the left kidney. No hydronephrosis in either kidney. Nephrograms are somewhat heterogeneous bilaterally with evidence of thickening of the wall of the right ureter. Changes are nonspecific but could indicate pyelonephritis. Clinical correlation suggested. Stomach is decompressed. Small bowel are not abnormally distended. Colon is decompressed but there is suggestion of colonic wall thickening involving the ascending colon and transverse region. Colitis is not excluded. No free air in the abdomen.  Pelvis: Bladder is decompressed. Gas within the bladder is probably due to instrumentation. Fistula or infection could also have this appearance. Uterus is surgically absent. No loculated pelvic fluid collections. Diverticula in the sigmoid colon without evidence of diverticulitis. Degenerative changes in the lumbar spine. No destructive bone lesions appreciated.  IMPRESSION: Diffuse fatty infiltration of the liver. Multiple tiny poorly defined low-attenuation lesions in the spleen. This could be related to lymphoma or infection. Soft tissue around the celiac axis is probably related to lymphoma. Abdominal and retroperitoneal lymph nodes are not pathologically enlarged. Heterogeneous renal nephrograms suggest possible pyelonephritis. Gas in the bladder may be due to infection. Pericolonic wall  thickening suggest colitis. Small amount of free fluid in the abdomen.   Electronically Signed   By: Lucienne Capers M.D.   On: 04/09/2014 20:30   Dg Chest Port 1 View  04/09/2014   CLINICAL DATA:  Emesis and diarrhea.  EXAM: PORTABLE CHEST - 1 VIEW  COMPARISON:  01/26/2014  FINDINGS: Mild cardiomegaly is stable from prior. Aortic hilar contours are unremarkable. Right IJ porta catheter is in stable position, tip at the upper cavoatrial junction. There is interstitial coarsening without edema, consolidation, effusion, or pneumothorax.  IMPRESSION: No active disease.   Electronically Signed   By: Monte Fantasia M.D.   On: 04/09/2014 18:49           Shanda Howells MD  Pager: [phone]

## 2014-04-09 NOTE — Telephone Encounter (Signed)
Pt called and left message stating that pt was feeling very nauseated today.  Pt also had chills lasted for approx. 45 min.  Pt also stated she has not been able to void normally.  Spoke with pt and was informed that pt had chills lasted for about 45 min. Without fever - temp of 97.5 per pt.  Pt was nauseated, and vomited just phlegm.  Pt stated she had been drinking lots of fluids as tolerated but unable to void normally - just a little bit each time.  Denied burning, pain on urination.  Stated feet and ankles swollen.  Instructed pt to come in now to see Jenny Reichmann, NP symptom management.  Onc tx sent to scheduler. Jenny Reichmann, NP notified. Pt's  Phone    [phone].

## 2014-04-09 NOTE — Telephone Encounter (Signed)
per pof to ach pt appt-per pof nurse adv pt of appt time

## 2014-04-09 NOTE — ED Provider Notes (Signed)
CSN: [id number]     Arrival date & time 04/09/14  1718 History   First MD Initiated Contact with Patient 04/09/14 1724     Chief Complaint  Patient presents with  . Emesis  . Diarrhea     (Consider location/radiation/quality/duration/timing/severity/associated sxs/prior Treatment) The history is provided by the patient. No language interpreter was used.  Patty [name] is a 74 y/o F with PMHx of asthma, hypothyroidism, Afib on anticoagulants, ACE cough, Detrusor instability, fibroids, HTN, rectocele, malignant lymphoma of the head/necl/face currently on chemotherapy last was last Wednesday presenting to the ED with emesis and diarrhea that started today. Husband that accompanies patient stated that she has been having nausea and vomiting today. Patient reported that she has had at least 4-5 episodes of emesis and stated that she has had at least 2 episodes of diarrhea. Patient reported that she has been able to keep fluids down, small amount. Stated that she feels weak. Reported that she did start to experience dysuria with urination today. Denied chest pain, shortness of breath, difficulty breathing, abdominal pain, melena, hematochezia, dizziness, fainting, headache, neck pain, back pain, hematuria, sick contacts, travel.  PCP Dr. Shelia Media Oncologist Dr. Elson Areas   Past Medical History  Diagnosis Date  . Asthma 06/15/2010  . Hypothyroidism 06/15/2010  . Hyperlipemia 06/15/2010  . Atrial fibrillation 05/28/2010    Managed with rate control and coumadin  . Long-term (current) use of anticoagulants   . ACE-inhibitor cough   . DI (detrusor instability)   . Fibroid   . Rectocele   . Atrophic vaginitis   . 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 Diabetes mellitus     Type 2  . Peripheral neuropathy   . Hypertension   . Dysrhythmia     cardioversion - 2012  . Complication of anesthesia 1980's    post anesth.- states she "went into resp. arrest" , but then remarked that she thought maybe  they gave her too much medicine (morphine)   . Shortness of breath     sometimes   . 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 Anemia   . 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 CPAP (continuous positive airway pressure) dependence   . GERD (gastroesophageal reflux disease)   . Hearing loss   . Sleep apnea     uses CPAP everynight- last study in [place] 5 yrs. or more   . Severe sepsis with septic shock 01/21/2014  . E coli bacteremia 01/20/2014   Past Surgical History  Procedure Laterality Date  . Appendectomy  1953  . Partial hysterectomy  1972  . Cholecystectomy  1993  . Back surgery      x2  . Cataract extraction    . Cardiac catheterization  10/10/2008    Nonobstructive CAD  . Abdominal hysterectomy  1972    TAH  . Foot surgery    . Tonsillectomy    . Pubovag repair with sling    . Skin tag removal  2012    leg  . Cardioversion  04/13/2011    Procedure: CARDIOVERSION;  Surgeon: Laverda Page, MD;  Location: [place];  Service: Cardiovascular;  Laterality: N/A;  . Eye surgery      /w IOL, post cataracts removed   . Mass biopsy Left 12/07/2013    Procedure: EXCISIONAL BIOPSY LEFT NECK MASS ;  Surgeon: Jerrell Belfast, MD;  Location: [facility] OR;  Service: ENT;  Laterality: Left;   Family History  Problem Relation Age of  Onset  . Stroke Father     at 80  . Heart attack Father     at 78  . Hypertension Father   . Heart disease Mother   . Arthritis Mother   . Breast cancer Mother     Age 79  . Heart failure Mother   . Heart attack Brother   . Hypertension Brother   . Diabetes Sister    History  Substance Use Topics  . Smoking status: Never Smoker   . Smokeless tobacco: Never Used  . Alcohol Use: No   OB History    Gravida Para Term Preterm AB TAB SAB Ectopic Multiple Living   2 2 2       2      Review of Systems  Constitutional: Positive for chills, diaphoresis and fatigue. Negative for fever.  Respiratory: Negative for cough, chest tightness and shortness of  breath.   Cardiovascular: Negative for chest pain.  Gastrointestinal: Positive for nausea and vomiting. Negative for abdominal pain, constipation, blood in stool and anal bleeding.  Genitourinary: Positive for dysuria. Negative for hematuria.  Musculoskeletal: Negative for back pain and neck pain.  Neurological: Negative for dizziness, weakness, numbness and headaches.      Allergies  Morphine and related; Demerol; Meperidine hcl; Multaq; Oysters; Penicillins; and Sulfa drugs cross reactors  Home Medications   Prior to Admission medications   Medication Sig Start Date End Date Taking? Authorizing Provider  acetaminophen (TYLENOL) 500 MG tablet Take 1,000 mg by mouth every 6 (six) hours as needed for mild pain (pain).    Yes Historical Provider, MD  Cholecalciferol (VITAMIN D) 2000 UNITS tablet Take 2,000 Units by mouth daily with lunch.   Yes Historical Provider, MD  digoxin (LANOXIN) 0.125 MG tablet Take 125 mcg by mouth daily.  06/07/11  Yes Peter M Martinique, MD  diltiazem (CARDIZEM CD) 120 MG 24 hr capsule Take 120 mg by mouth daily.  06/07/11  Yes Peter M Martinique, MD  diphenhydramine-acetaminophen (TYLENOL PM) 25-500 MG TABS Take 1 tablet by mouth at bedtime as needed (sleep).   Yes Historical Provider, MD  diphenoxylate-atropine (LOMOTIL) 2.5-0.025 MG per tablet Take 1 tablet by mouth 4 (four) times daily as needed for diarrhea or loose stools. 04/03/14  Yes Heath Lark, MD  ezetimibe (ZETIA) 10 MG tablet Take 10 mg by mouth at bedtime.    Yes Historical Provider, MD  Fluticasone-Salmeterol (ADVAIR) 250-50 MCG/DOSE AEPB Inhale 1 puff into the lungs every 12 (twelve) hours.    Yes Historical Provider, MD  levothyroxine (SYNTHROID, LEVOTHROID) 125 MCG tablet Take 250 mcg by mouth daily before breakfast.    Yes Historical Provider, MD  lidocaine-prilocaine (EMLA) cream Apply 1 application topically as needed. Apply to [facility] a Cath site at least one hour prior to needle stick as needed. 01/04/14  Yes  Heath Lark, MD  Magnesium Oxide 500 MG TABS Take 500 mg by mouth at bedtime.   Yes Historical Provider, MD  metFORMIN (GLUCOPHAGE-XR) 500 MG 24 hr tablet Take 500 mg by mouth 2 (two) times daily with a meal.   Yes Historical Provider, MD  montelukast (SINGULAIR) 10 MG tablet Take 10 mg by mouth daily.   Yes Historical Provider, MD  omega-3 acid ethyl esters (LOVAZA) 1 G capsule Take 1 g by mouth 2 (two) times daily.   Yes Historical Provider, MD  ondansetron (ZOFRAN) 8 MG tablet Take 8 mg by mouth every 8 (eight) hours as needed for nausea or vomiting (nausea).  01/08/14  Yes Historical Provider, MD  potassium chloride SA (K-DUR,KLOR-CON) 20 MEQ tablet Take 1 tablet (20 mEq total) by mouth daily. 12/22/11  Yes Peter M Martinique, MD  pravastatin (PRAVACHOL) 40 MG tablet Take 20 mg by mouth See admin instructions. Take 1/2 tablet (20 mg) ONLY on Monday, Tuesday, Wednesday, and Thursday Nights.   Yes Historical Provider, MD  prochlorperazine (COMPAZINE) 10 MG tablet Take 10 mg by mouth every 8 (eight) hours as needed for nausea (nausea).  01/08/14  Yes Historical Provider, MD  warfarin (COUMADIN) 5 MG tablet Take 2.5-5 mg by mouth daily. Take 1/2 tablet (2.5 mg) on (Sun, Tues, Thurs, Sat) and Take 1 tablet (5 mg) on (Mon, Wed, Fri)   Yes Peter M Martinique, MD  albuterol [facility] HFA) 108 (90 BASE) MCG/ACT inhaler Inhale 2 puffs into the lungs every 6 (six) hours as needed for wheezing or shortness of breath.    Historical Provider, MD  conjugated estrogens (PREMARIN) vaginal cream Place 1 Applicatorful vaginally 3 (three) times a week. Monday, Wednesday, and Friday nights    Historical Provider, MD  traMADol-acetaminophen (ULTRACET) 37.5-325 MG per tablet Take 1 tablet by mouth every 6 (six) hours as needed. 02/01/14   Christina P Rama, MD   BP 100/43 mmHg  Pulse 80  Temp(Src) 97.4 F (36.3 C) (Oral)  Resp 18  SpO2 94% Physical Exam  Constitutional: She is oriented to person, place, and time. She appears  well-developed and well-nourished.  Patient appears lethargic  HENT:  Head: Normocephalic and atraumatic.  Dry mucous membranes  Eyes: Conjunctivae and EOM are normal. Pupils are equal, round, and reactive to light. Right eye exhibits no discharge. Left eye exhibits no discharge.  Neck: Normal range of motion. Neck supple. No tracheal deviation present.  Negative nuchal rigidity Negative neck stiffness Negative meningeal signs  Cardiovascular: Normal heart sounds.  An irregular rhythm present. Tachycardia present.  Exam reveals no friction rub.   No murmur heard. Pulses:      Radial pulses are 2+ on the right side, and 2+ on the left side.       Dorsalis pedis pulses are 2+ on the right side, and 2+ on the left side.  Pulmonary/Chest: Effort normal and breath sounds normal. No respiratory distress. She has no wheezes. She has no rales. She exhibits no tenderness.  Abdominal: Soft. Bowel sounds are normal. She exhibits no distension. There is tenderness in the suprapubic area. There is no rebound and no guarding.  Musculoskeletal: Normal range of motion.  Lymphadenopathy:    She has no cervical adenopathy.  Neurological: She is alert and oriented to person, place, and time.  Skin: Skin is warm and dry. No rash noted. No erythema.  Psychiatric: She has a normal mood and affect. Her behavior is normal. Thought content normal.  Nursing note and vitals reviewed.   ED Course  Procedures (including critical care time)  Results for orders placed or performed during the hospital encounter of 04/09/14  CBC with Differential/Platelet  Result Value Ref Range   WBC 14.2 (H) 4.0 - 10.5 K/uL   RBC 3.09 (L) 3.87 - 5.11 MIL/uL   Hemoglobin 9.6 (L) 12.0 - 15.0 g/dL   HCT 28.9 (L) 36.0 - 46.0 %   MCV 93.5 78.0 - 100.0 fL   MCH 31.1 26.0 - 34.0 pg   MCHC 33.2 30.0 - 36.0 g/dL   RDW 17.3 (H) 11.5 - 15.5 %   Platelets 225 150 - 400 K/uL   Neutrophils Relative %  92 (H) 43 - 77 %   Neutro Abs 13.1  (H) 1.7 - 7.7 K/uL   Lymphocytes Relative 4 (L) 12 - 46 %   Lymphs Abs 0.5 (L) 0.7 - 4.0 K/uL   Monocytes Relative 4 3 - 12 %   Monocytes Absolute 0.5 0.1 - 1.0 K/uL   Eosinophils Relative 0 0 - 5 %   Eosinophils Absolute 0.0 0.0 - 0.7 K/uL   Basophils Relative 0 0 - 1 %   Basophils Absolute 0.0 0.0 - 0.1 K/uL  Comprehensive metabolic panel  Result Value Ref Range   Sodium 137 135 - 145 mmol/L   Potassium 3.7 3.5 - 5.1 mmol/L   Chloride 103 96 - 112 mmol/L   CO2 23 19 - 32 mmol/L   Glucose, Bld 149 (H) 70 - 99 mg/dL   BUN 10 6 - 23 mg/dL   Creatinine, Ser 0.57 0.50 - 1.10 mg/dL   Calcium 8.7 8.4 - 10.5 mg/dL   Total Protein 6.1 6.0 - 8.3 g/dL   Albumin 3.4 (L) 3.5 - 5.2 g/dL   AST 42 (H) 0 - 37 U/L   ALT 26 0 - 35 U/L   Alkaline Phosphatase 94 39 - 117 U/L   Total Bilirubin 0.8 0.3 - 1.2 mg/dL   GFR calc non Af Amer 88 (L) >90 mL/min   GFR calc Af Amer >90 >90 mL/min   Anion gap 11 5 - 15  Lipase, blood  Result Value Ref Range   Lipase 12 11 - 59 U/L  Urinalysis, Routine w reflex microscopic  Result Value Ref Range   Color, Urine YELLOW YELLOW   APPearance CLOUDY (A) CLEAR   Specific Gravity, Urine 1.013 1.005 - 1.030   pH 6.5 5.0 - 8.0   Glucose, UA NEGATIVE NEGATIVE mg/dL   Hgb urine dipstick TRACE (A) NEGATIVE   Bilirubin Urine NEGATIVE NEGATIVE   Ketones, ur NEGATIVE NEGATIVE mg/dL   Protein, ur NEGATIVE NEGATIVE mg/dL   Urobilinogen, UA 1.0 0.0 - 1.0 mg/dL   Nitrite NEGATIVE NEGATIVE   Leukocytes, UA MODERATE (A) NEGATIVE  Digoxin level  Result Value Ref Range   Digoxin Level 0.7 (L) 0.8 - 2.0 ng/mL  Protime-INR  Result Value Ref Range   Prothrombin Time 17.4 (H) 11.6 - 15.2 seconds   INR 1.41 0.00 - 1.49  Troponin I  Result Value Ref Range   Troponin I 0.03 <0.031 ng/mL  Urine microscopic-add on  Result Value Ref Range   WBC, UA TOO NUMEROUS TO COUNT <3 WBC/hpf   RBC / HPF 0-2 <3 RBC/hpf   Bacteria, UA MANY (A) RARE  I-Stat CG4 Lactic Acid, ED (not at  [facility])  Result Value Ref Range   Lactic Acid, Venous 2.61 (HH) 0.5 - 2.0 mmol/L  I-Stat CG4 Lactic Acid, ED (not at [facility])  Result Value Ref Range   Lactic Acid, Venous 2.65 (HH) 0.5 - 2.0 mmol/L   Comment NOTIFIED PHYSICIAN     Labs Review Labs Reviewed  CBC WITH DIFFERENTIAL/PLATELET - Abnormal; Notable for the following:    WBC 14.2 (*)    RBC 3.09 (*)    Hemoglobin 9.6 (*)    HCT 28.9 (*)    RDW 17.3 (*)    Neutrophils Relative % 92 (*)    Neutro Abs 13.1 (*)    Lymphocytes Relative 4 (*)    Lymphs Abs 0.5 (*)    All other components within normal limits  COMPREHENSIVE METABOLIC PANEL - Abnormal; Notable for  the following:    Glucose, Bld 149 (*)    Albumin 3.4 (*)    AST 42 (*)    GFR calc non Af Amer 88 (*)    All other components within normal limits  URINALYSIS, ROUTINE W REFLEX MICROSCOPIC - Abnormal; Notable for the following:    APPearance CLOUDY (*)    Hgb urine dipstick TRACE (*)    Leukocytes, UA MODERATE (*)    All other components within normal limits  DIGOXIN LEVEL - Abnormal; Notable for the following:    Digoxin Level 0.7 (*)    All other components within normal limits  PROTIME-INR - Abnormal; Notable for the following:    Prothrombin Time 17.4 (*)    All other components within normal limits  URINE MICROSCOPIC-ADD ON - Abnormal; Notable for the following:    Bacteria, UA MANY (*)    All other components within normal limits  I-STAT CG4 LACTIC ACID, ED - Abnormal; Notable for the following:    Lactic Acid, Venous 2.61 (*)    All other components within normal limits  I-STAT CG4 LACTIC ACID, ED - Abnormal; Notable for the following:    Lactic Acid, Venous 2.65 (*)    All other components within normal limits  CULTURE, BLOOD (ROUTINE X 2)  CULTURE, BLOOD (ROUTINE X 2)  URINE CULTURE  LIPASE, BLOOD  TROPONIN I  CBC WITH DIFFERENTIAL/PLATELET  COMPREHENSIVE METABOLIC PANEL  LACTIC ACID, PLASMA  LACTIC ACID, PLASMA  PROCALCITONIN  PROTIME-INR   APTT    Imaging Review Ct Abdomen Pelvis W Contrast  04/09/2014   CLINICAL DATA:  Malignant lymphoma. Last chemotherapy 04/03/2014. Now complains of chills, weakness, nausea, vomiting, diarrhea. History of appendectomy, hysterectomy, cholecystectomy.  EXAM: CT ABDOMEN AND PELVIS WITH CONTRAST  TECHNIQUE: Multidetector CT imaging of the abdomen and pelvis was performed using the standard protocol following bolus administration of intravenous contrast.  CONTRAST:  153mL OMNIPAQUE IOHEXOL 300 MG/ML  SOLN  COMPARISON:  PET-CT 02/26/2014.  CT abdomen and pelvis 07/01/2010.  FINDINGS: Atelectasis or infiltration in both lung bases. Mild cardiac enlargement.  Diffuse fatty infiltration of the liver. Surgical absence of the gallbladder. No bile duct dilatation. Pancreas is atrophic. Multiple tiny poorly defined low-attenuation lesions in the spleen. This could be related to lymphoma or infections. Soft tissue filling with poor definition of fat around the celiac axis. No discrete lymph node is demonstrated. This is likely related to lymphoma. Small amount of free fluid/ edema in the upper abdomen and mesenteric. This is been present previously and is likely reactive or inflammatory. Scattered mesenteric and retroperitoneal lymph nodes are not pathologically enlarged. Calcification of the aorta without aneurysm. The inferior vena cava is unremarkable. Parapelvic cysts on the left kidney. No hydronephrosis in either kidney. Nephrograms are somewhat heterogeneous bilaterally with evidence of thickening of the wall of the right ureter. Changes are nonspecific but could indicate pyelonephritis. Clinical correlation suggested. Stomach is decompressed. Small bowel are not abnormally distended. Colon is decompressed but there is suggestion of colonic wall thickening involving the ascending colon and transverse region. Colitis is not excluded. No free air in the abdomen.  Pelvis: Bladder is decompressed. Gas within the bladder  is probably due to instrumentation. Fistula or infection could also have this appearance. Uterus is surgically absent. No loculated pelvic fluid collections. Diverticula in the sigmoid colon without evidence of diverticulitis. Degenerative changes in the lumbar spine. No destructive bone lesions appreciated.  IMPRESSION: Diffuse fatty infiltration of the liver. Multiple tiny poorly defined  low-attenuation lesions in the spleen. This could be related to lymphoma or infection. Soft tissue around the celiac axis is probably related to lymphoma. Abdominal and retroperitoneal lymph nodes are not pathologically enlarged. Heterogeneous renal nephrograms suggest possible pyelonephritis. Gas in the bladder may be due to infection. Pericolonic wall thickening suggest colitis. Small amount of free fluid in the abdomen.   Electronically Signed   By: Lucienne Capers M.D.   On: 04/09/2014 20:30   Dg Chest Port 1 View  04/09/2014   CLINICAL DATA:  Emesis and diarrhea.  EXAM: PORTABLE CHEST - 1 VIEW  COMPARISON:  01/26/2014  FINDINGS: Mild cardiomegaly is stable from prior. Aortic hilar contours are unremarkable. Right IJ porta catheter is in stable position, tip at the upper cavoatrial junction. There is interstitial coarsening without edema, consolidation, effusion, or pneumothorax.  IMPRESSION: No active disease.   Electronically Signed   By: Monte Fantasia M.D.   On: 04/09/2014 18:49     EKG Interpretation   Date/Time:  Tuesday April 09 2014 21:35:49 EST Ventricular Rate:  78 PR Interval:    QRS Duration: 84 QT Interval:  567 QTC Calculation: 646 R Axis:   -77 Text Interpretation:  Atrial fibrillation Inferior infarct, old Probable  anterior infarct, age indeterminate Prolonged QT interval Baseline wander  in lead(s) V5 nonspecific ST/T changes no significant change from earlier  in the day Confirmed by GOLDSTON  MD, SCOTT ([id number]) on 04/09/2014 9:41:41 PM       9:13 PM Spoke with Dr. Marjie Skiff,  Oncologist. Discussed case, labs, imaging, vitals in great detail. Oncology aware that patient is to be admitted.   9:46 PM This provider spoke with Dr. Chauncey Cruel. [place]. Discussed case, labs, imaging, vitals, ED course in great detail. Patient to be seen by admitting for sepsis. Recommended Surgery to be consulted regarding free fluid noted on the CT abdomen and pelvis with contrast.   10:16 PM Spoke with Dr. Excell Seltzer, [place] Surgeon. Physician is aware of patient and will review CT.  MDM   Final diagnoses:  Sepsis, due to unspecified organism  Pyelonephritis  Immunocompromised    Medications  levofloxacin (LEVAQUIN) IVPB 750 mg (not administered)  vancomycin (VANCOCIN) 500 mg in sodium chloride 0.9 % 100 mL IVPB (not administered)  aztreonam (AZACTAM) 2 g in dextrose 5 % 50 mL IVPB (not administered)  levofloxacin (LEVAQUIN) IVPB 750 mg (not administered)  metroNIDAZOLE (FLAGYL) IVPB 500 mg (not administered)  acetaminophen (TYLENOL) tablet 650 mg (650 mg Oral Given 04/09/14 1816)  sodium chloride 0.9 % bolus 1,000 mL (0 mLs Intravenous Stopped 04/09/14 1856)  levofloxacin (LEVAQUIN) IVPB 750 mg (0 mg Intravenous Stopped 04/09/14 1920)  aztreonam (AZACTAM) 2 g in dextrose 5 % 50 mL IVPB (0 g Intravenous Stopped 04/09/14 1856)  vancomycin (VANCOCIN) IVPB 1000 mg/200 mL premix (0 mg Intravenous Stopped 04/09/14 2049)  iohexol (OMNIPAQUE) 300 MG/ML solution 100 mL (100 mLs Intravenous Contrast Given 04/09/14 2002)  sodium chloride 0.9 % bolus 1,000 mL (1,000 mLs Intravenous New Bag/Given 04/09/14 2137)    Filed Vitals:   04/09/14 1945 04/09/14 2121 04/09/14 2153 04/09/14 2202  BP: 117/72 101/49 97/41 100/43  Pulse: 78 85 66 80  Temp:  97.4 F (36.3 C)    TempSrc:  Oral    Resp: 23 15 10 18   SpO2: 93% 94% 100% 94%   EKG noted atrial fibrillation with a heart rate of 78 bpm - prolonged QT interval. Troponin negative elevation. PT-INR normal. CBC noted elevated leukocytosis of 14.2. Hemoglobin  9.6,  hematocrit 28.9 - appears to be chronic. CMP noted glucose of 149, anion 11.0 mEq per liter. Lipase negative elevation. Digoxin level 0.7-doubt dig toxicity. Lactic acid elevated at 2.61. Urinalysis noted trace of hemoglobin with moderate leukocytes with white blood cell count to numerous to count with clumps. Chest x-ray no active disease noted. CT abdomen and pelvis with contrast noted possibility of pyelonephritis as well as gas noted in the bladder can be due to infection. Para colonic wall thickening suggests colitis. Small amount of free fluid in the abdomen. Patient appears to have UTI/pyelonephritis. CT abdomen and pelvis with contrast with possible colitis. Blood cultures obtained and pending. Urine culture pending. Patient given Tylenol by mouth. Patient started on IV antibiotics and IV fluids. Level II Code Sepsis called. Fever has improved-101.59F to 97.59F. 80 bpm heart rate when originally patient was approximately 115 bpm. Patient will need to be admitted to [facility]. Discussed case in great detail with [facility]. Discussed with surgery. Patient agreed to plan of admission. Patient stable for transfer.   Jamse Mead, PA-C 04/09/14 2222  Jamse Mead, PA-C 04/10/14 0300  Ephraim Hamburger, MD 04/10/14 [phone]

## 2014-04-09 NOTE — Patient Instructions (Signed)
Dehydration, Adult Dehydration is when you lose more fluids from the body than you take in. Vital organs like the kidneys, brain, and heart cannot function without a proper amount of fluids and salt. Any loss of fluids from the body can cause dehydration.  CAUSES   Vomiting.  Diarrhea.  Excessive sweating.  Excessive urine output.  Fever. SYMPTOMS  Mild dehydration  Thirst.  Dry lips.  Slightly dry mouth. Moderate dehydration  Very dry mouth.  Sunken eyes.  Skin does not bounce back quickly when lightly pinched and released.  Dark urine and decreased urine production.  Decreased tear production.  Headache. Severe dehydration  Very dry mouth.  Extreme thirst.  Rapid, weak pulse (more than 100 beats per minute at rest).  Cold hands and feet.  Not able to sweat in spite of heat and temperature.  Rapid breathing.  Blue lips.  Confusion and lethargy.  Difficulty being awakened.  Minimal urine production.  No tears. DIAGNOSIS  Your caregiver will diagnose dehydration based on your symptoms and your exam. Blood and urine tests will help confirm the diagnosis. The diagnostic evaluation should also identify the cause of dehydration. TREATMENT  Treatment of mild or moderate dehydration can often be done at home by increasing the amount of fluids that you drink. It is best to drink small amounts of fluid more often. Drinking too much at one time can make vomiting worse. Refer to the home care instructions below. Severe dehydration needs to be treated at the hospital where you will probably be given intravenous (IV) fluids that contain water and electrolytes. HOME CARE INSTRUCTIONS   Ask your caregiver about specific rehydration instructions.  Drink enough fluids to keep your urine clear or pale yellow.  Drink small amounts frequently if you have nausea and vomiting.  Eat as you normally do.  Avoid:  Foods or drinks high in sugar.  Carbonated  drinks.  Juice.  Extremely hot or cold fluids.  Drinks with caffeine.  Fatty, greasy foods.  Alcohol.  Tobacco.  Overeating.  Gelatin desserts.  Wash your hands well to avoid spreading bacteria and viruses.  Only take over-the-counter or prescription medicines for pain, discomfort, or fever as directed by your caregiver.  Ask your caregiver if you should continue all prescribed and over-the-counter medicines.  Keep all follow-up appointments with your caregiver. SEEK MEDICAL CARE IF:  You have abdominal pain and it increases or stays in one area (localizes).  You have a rash, stiff neck, or severe headache.  You are irritable, sleepy, or difficult to awaken.  You are weak, dizzy, or extremely thirsty. SEEK IMMEDIATE MEDICAL CARE IF:   You are unable to keep fluids down or you get worse despite treatment.  You have frequent episodes of vomiting or diarrhea.  You have blood or green matter (bile) in your vomit.  You have blood in your stool or your stool looks black and tarry.  You have not urinated in 6 to 8 hours, or you have only urinated a small amount of very dark urine.  You have a fever.  You faint. MAKE SURE YOU:   Understand these instructions.  Will watch your condition.  Will get help right away if you are not doing well or get worse. Document Released: 02/15/2005 Document Revised: 05/10/2011 Document Reviewed: 10/05/2010 Exit[employer] Patient Information 2015 [employer]. This information is not intended to replace advice given to you by your health care provider. Make sure you discuss any questions you have with your health care   provider.  

## 2014-04-09 NOTE — Progress Notes (Addendum)
ANTIBIOTIC CONSULT NOTE - INITIAL  Pharmacy Consult for Vancomycin, Aztreonam, Levofloxacin, flagyl Indication: Sepsis  Allergies  Allergen Reactions  . Morphine And Related Anaphylaxis and Other (See Comments)    Pt states she stopped breathing post op- "went into resp. arrest"  . Demerol Nausea And Vomiting  . Meperidine Hcl Nausea And Vomiting  . Multaq [Dronedarone] Other (See Comments)    Reaction:  Blood in urine and elevated liver enzymes.   Jeanie Cooks Allergy] Rash  . Penicillins Itching and Rash  . Sulfa Drugs Cross Reactors Rash    Patient Measurements: Height : 66 inches Weight : 60.2 kg  Vital Signs: Temp: 101.4 F (38.6 C) (02/09 1728) Temp Source: Oral (02/09 1728) BP: 172/74 mmHg (02/09 1728) Pulse Rate: 108 (02/09 1728) Intake/Output from previous day:   Intake/Output from this shift:    Labs:  Recent Labs  04/09/14 1326 04/09/14 1328 04/09/14 1747  WBC 13.9*  --  14.2*  HGB 9.6*  --  9.6*  PLT 232  --  225  CREATININE  --  0.7  --    Estimated Creatinine Clearance: 56.9 mL/min (by C-G formula based on Cr of 0.7). No results for input(s): VANCOTROUGH, VANCOPEAK, VANCORANDOM, GENTTROUGH, GENTPEAK, GENTRANDOM, TOBRATROUGH, TOBRAPEAK, TOBRARND, AMIKACINPEAK, AMIKACINTROU, AMIKACIN in the last 72 hours.   Microbiology: No results found for this or any previous visit (from the past 720 hour(s)).  Medical History: Past Medical History  Diagnosis Date  . Asthma 06/15/2010  . Hypothyroidism 06/15/2010  . Hyperlipemia 06/15/2010  . Atrial fibrillation 05/28/2010    Managed with rate control and coumadin  . Long-term (current) use of anticoagulants   . ACE-inhibitor cough   . DI (detrusor instability)   . Fibroid   . Rectocele   . Atrophic vaginitis   . 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 Diabetes mellitus     Type 2  . Peripheral neuropathy   . Hypertension   . Dysrhythmia     cardioversion - 2012  . Complication of  anesthesia 1980's    post anesth.- states she "went into resp. arrest" , but then remarked that she thought maybe they gave her too much medicine (morphine)   . Shortness of breath     sometimes   . 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 Anemia   . 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 CPAP (continuous positive airway pressure) dependence   . GERD (gastroesophageal reflux disease)   . Hearing loss   . Sleep apnea     uses CPAP everynight- last study in [place] 5 yrs. or more   . Severe sepsis with septic shock 01/21/2014  . E coli bacteremia 01/20/2014    Medications:  Scheduled:   Infusions:  . aztreonam 2 g (04/09/14 1825)  . levofloxacin (LEVAQUIN) IV    . vancomycin     Assessment:  74 yr female with malignant lymphomas (last chemo 04/03/2014) with complaint of chills, weakness, & nausea/vomiting/diarrhea  Patient has received Aztreonam 2gm in ED.  Vancomycin 1gm and Levaquin 750mg  x 1 dose also ordered.  Blood and urine cultures ordered  Pharmacy consulted to dose Vancomycin, Aztreonam and Levaquin for sepsis.  Goal of Therapy:  Vancomycin trough level 15-20 mcg/ml  Plan:  Measure antibiotic drug levels at steady state Follow up culture results  Levaquin 750mg  IV q24h Aztreonam 2gm IV q8h Following initial Vancomycin 1gm dose, continue Vancomycin 500mg  IV q12h flagy 500mg  iv q8hr  Poindexter, Toribio Harbour,  PharmD 04/09/2014,6:31 PM

## 2014-04-09 NOTE — ED Notes (Signed)
Patient in CT

## 2014-04-10 ENCOUNTER — Encounter (HOSPITAL_COMMUNITY): Payer: Self-pay | Admitting: *Deleted

## 2014-04-10 ENCOUNTER — Encounter: Payer: Self-pay | Admitting: Nurse Practitioner

## 2014-04-10 DIAGNOSIS — A419 Sepsis, unspecified organism: Secondary | ICD-10-CM

## 2014-04-10 DIAGNOSIS — Z7901 Long term (current) use of anticoagulants: Secondary | ICD-10-CM | POA: Insufficient documentation

## 2014-04-10 DIAGNOSIS — R7881 Bacteremia: Secondary | ICD-10-CM

## 2014-04-10 DIAGNOSIS — K529 Noninfective gastroenteritis and colitis, unspecified: Secondary | ICD-10-CM

## 2014-04-10 DIAGNOSIS — N390 Urinary tract infection, site not specified: Secondary | ICD-10-CM | POA: Insufficient documentation

## 2014-04-10 LAB — URINE CULTURE

## 2014-04-10 LAB — LACTIC ACID, PLASMA: Lactic Acid, Venous: 1.8 mmol/L (ref 0.5–2.0)

## 2014-04-10 LAB — COMPREHENSIVE METABOLIC PANEL
ALBUMIN: 2.7 g/dL — AB (ref 3.5–5.2)
ALK PHOS: 68 U/L (ref 39–117)
ALT: 25 U/L (ref 0–35)
AST: 41 U/L — AB (ref 0–37)
Anion gap: 9 (ref 5–15)
BILIRUBIN TOTAL: 1.1 mg/dL (ref 0.3–1.2)
BUN: 12 mg/dL (ref 6–23)
CHLORIDE: 105 mmol/L (ref 96–112)
CO2: 21 mmol/L (ref 19–32)
Calcium: 7.7 mg/dL — ABNORMAL LOW (ref 8.4–10.5)
Creatinine, Ser: 0.65 mg/dL (ref 0.50–1.10)
GFR calc Af Amer: >90 mL/min (ref >90)
GFR, EST NON AFRICAN AMERICAN: 85 mL/min — AB (ref >90)
Glucose, Bld: 137 mg/dL — ABNORMAL HIGH (ref 70–99)
POTASSIUM: 4 mmol/L (ref 3.5–5.1)
Sodium: 135 mmol/L (ref 135–145)
Total Protein: 5 g/dL — ABNORMAL LOW (ref 6.0–8.3)

## 2014-04-10 LAB — CBC WITH DIFFERENTIAL/PLATELET
BASOS ABS: 0 10*3/uL (ref 0.0–0.1)
Basophils Relative: 0 % (ref 0–1)
EOS ABS: 0 10*3/uL (ref 0.0–0.7)
EOS PCT: 0 % (ref 0–5)
HEMATOCRIT: 24.8 % — AB (ref 36.0–46.0)
Hemoglobin: 8.1 g/dL — ABNORMAL LOW (ref 12.0–15.0)
Lymphocytes Relative: 3 % — ABNORMAL LOW (ref 12–46)
Lymphs Abs: 0.6 10*3/uL — ABNORMAL LOW (ref 0.7–4.0)
MCH: 30.6 pg (ref 26.0–34.0)
MCHC: 32.7 g/dL (ref 30.0–36.0)
MCV: 93.6 fL (ref 78.0–100.0)
MONO ABS: 1.8 10*3/uL — AB (ref 0.1–1.0)
Monocytes Relative: 10 % (ref 3–12)
Neutro Abs: 15.4 10*3/uL — ABNORMAL HIGH (ref 1.7–7.7)
Neutrophils Relative %: 87 % — ABNORMAL HIGH (ref 43–77)
PLATELETS: 186 10*3/uL (ref 150–400)
RBC: 2.65 MIL/uL — ABNORMAL LOW (ref 3.87–5.11)
RDW: 17.3 % — AB (ref 11.5–15.5)
WBC: 17.8 10*3/uL — ABNORMAL HIGH (ref 4.0–10.5)

## 2014-04-10 LAB — GLUCOSE, CAPILLARY
Glucose-Capillary: 109 mg/dL — ABNORMAL HIGH (ref 70–99)
Glucose-Capillary: 109 mg/dL — ABNORMAL HIGH (ref 70–99)
Glucose-Capillary: 110 mg/dL — ABNORMAL HIGH (ref 70–99)
Glucose-Capillary: 129 mg/dL — ABNORMAL HIGH (ref 70–99)
Glucose-Capillary: 141 mg/dL — ABNORMAL HIGH (ref 70–99)
Glucose-Capillary: 213 mg/dL — ABNORMAL HIGH (ref 70–99)

## 2014-04-10 LAB — PROTIME-INR
INR: 1.57 — ABNORMAL HIGH (ref 0.00–1.49)
Prothrombin Time: 19 seconds — ABNORMAL HIGH (ref 11.6–15.2)

## 2014-04-10 LAB — MRSA PCR SCREENING: MRSA BY PCR: NEGATIVE

## 2014-04-10 MED ORDER — PROMETHAZINE HCL 25 MG/ML IJ SOLN
INTRAMUSCULAR | Status: AC
  Administered 2014-04-10: 02:00:00
  Filled 2014-04-10: qty 1

## 2014-04-10 MED ORDER — WARFARIN SODIUM 5 MG PO TABS
5.0000 mg | ORAL_TABLET | Freq: Once | ORAL | Status: AC
Start: 2014-04-10 — End: 2014-04-10
  Administered 2014-04-10: 5 mg via ORAL

## 2014-04-10 MED ORDER — WARFARIN SODIUM 5 MG PO TABS: 5.0000 mg | ORAL_TABLET | Freq: Once | ORAL | Status: DC

## 2014-04-10 MED ORDER — PROMETHAZINE HCL 25 MG/ML IJ SOLN: 12.5000 mg | Freq: Once | INTRAMUSCULAR | Status: DC

## 2014-04-10 NOTE — Progress Notes (Signed)
Notified Dr. Wynelle Cleveland of blood culture results.

## 2014-04-10 NOTE — Assessment & Plan Note (Signed)
Patient complaining of mild dysuria and urinary hesitancy for the past 24 hours.  She also notes a low-grade temperature to 99.3. Urinalysis today did reveal a urinary tract infection.  Culture obtained ; and awaiting results.    given patient's continued nausea/vomiting /dry heaving , urinary tract infection, and fever- will transport patient to the emergency department for further evaluation and management. Brief  History and report was called to emergency department charge nurse [place] prior to patient being transported to the emergency department per [facility] via wheelchair.

## 2014-04-10 NOTE — Assessment & Plan Note (Signed)
Patient has a history of chronic atrial fib; and continues on Coumadin as previously directed.

## 2014-04-10 NOTE — Progress Notes (Signed)
SYMPTOM MANAGEMENT CLINIC   HPI: Patty [name] 74 y.o. female diagnosed with lymphoma.  Currently undergoing Rituxan chemotherapy regimen.  Patient called the cancer Center today requesting urgent care visit.  Patient reports a 24-48 hour history of nausea, vomiting, and dry heaving.  She has also been experiencing some intermittent diarrhea.  She does feel dehydrated today.  Also, patient is complaining of some mild dysuria and urinary hesitancy; as well as a low-grade fever.  Patient takes Coumadin on a regular basis for chronic atrial fib.  Patient is also a known diabetic.   HPI  ROS  Past Medical History  Diagnosis Date  . Asthma 06/15/2010  . Hypothyroidism 06/15/2010  . Hyperlipemia 06/15/2010  . Atrial fibrillation 05/28/2010    Managed with rate control and coumadin  . Long-term (current) use of anticoagulants   . ACE-inhibitor cough   . DI (detrusor instability)   . Fibroid   . Rectocele   . Atrophic vaginitis   . 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 Diabetes mellitus     Type 2  . Peripheral neuropathy   . Hypertension   . Dysrhythmia     cardioversion - 2012  . Complication of anesthesia 1980's    post anesth.- states she "went into resp. arrest" , but then remarked that she thought maybe they gave her too much medicine (morphine)   . Shortness of breath     sometimes   . 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 Anemia   . 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 CPAP (continuous positive airway pressure) dependence   . GERD (gastroesophageal reflux disease)   . Hearing loss   . Sleep apnea     uses CPAP everynight- last study in [place] 5 yrs. or more   . Severe sepsis with septic shock 01/21/2014  . E coli bacteremia 01/20/2014    Past Surgical History  Procedure Laterality Date  . Appendectomy  1953  . Partial hysterectomy  1972  . Cholecystectomy  1993  . Back surgery      x2  . Cataract extraction    . Cardiac catheterization   10/10/2008    Nonobstructive CAD  . Abdominal hysterectomy  1972    TAH  . Foot surgery    . Tonsillectomy    . Pubovag repair with sling    . Skin tag removal  2012    leg  . Cardioversion  04/13/2011    Procedure: CARDIOVERSION;  Surgeon: Laverda Page, MD;  Location: [place];  Service: Cardiovascular;  Laterality: N/A;  . Eye surgery      /w IOL, post cataracts removed   . Mass biopsy Left 12/07/2013    Procedure: EXCISIONAL BIOPSY LEFT NECK MASS ;  Surgeon: Jerrell Belfast, MD;  Location: [place];  Service: ENT;  Laterality: Left;    has Chronic atrial fibrillation; Hyperlipemia; Hypothyroidism; Asthma; Arthritis; Long-term (current) use of anticoagulants; CAD (coronary atherosclerotic disease); DI (detrusor instability); Fibroid; Rectocele; Atrophic vaginitis; Sleep apnea; Dyspnea on exertion; Diabetes mellitus type 2, controlled, without complications; 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, face, and neck; Anemia in neoplastic disease; At high risk of tumor lysis syndrome; Thrombocytopenia; Diarrhea; Metabolic acidosis; Mucositis due to chemotherapy; Protein calorie malnutrition; Leukocytosis; Bilateral leg edema; Dehydration; Sepsis; Colitis; Pyelonephritis; Nausea with vomiting; Long term current use of anticoagulant therapy; and UTI (urinary tract infection) on her problem list.    is allergic to morphine and related; demerol; meperidine hcl;  multaq; oysters; penicillins; and sulfa drugs cross reactors.    Medication List       This list is accurate as of: 04/09/14  5:18 PM.  Always use your most recent med list.               acetaminophen 500 MG tablet  Commonly known as:  TYLENOL  Take 1,000 mg by mouth every 6 (six) hours as needed for mild pain (pain).     BIOTENE DRY MOUTH CARE DT  Place onto teeth.     conjugated estrogens vaginal cream  Commonly known as:  PREMARIN  Place 1 Applicatorful vaginally 3 (three) times a week. Monday, Wednesday, and Friday nights      dicyclomine 10 MG capsule  Commonly known as:  BENTYL  Take 10 mg by mouth daily as needed (ibs flare up).     digoxin 0.125 MG tablet  Commonly known as:  LANOXIN  Take 125 mcg by mouth daily.     diltiazem 120 MG 24 hr capsule  Commonly known as:  CARDIZEM CD  Take 120 mg by mouth daily.     diphenoxylate-atropine 2.5-0.025 MG per tablet  Commonly known as:  LOMOTIL  Take 1 tablet by mouth 4 (four) times daily as needed for diarrhea or loose stools.     ezetimibe 10 MG tablet  Commonly known as:  ZETIA  Take 10 mg by mouth at bedtime.     Fluticasone-Salmeterol 250-50 MCG/DOSE Aepb  Commonly known as:  ADVAIR  Inhale 1 puff into the lungs every 12 (twelve) hours.     levothyroxine 125 MCG tablet  Commonly known as:  SYNTHROID, LEVOTHROID  Take 250 mcg by mouth daily before breakfast.     lidocaine-prilocaine cream  Commonly known as:  EMLA  Apply 1 application topically as needed. Apply to [facility] a Cath site at least one hour prior to needle stick as needed.     Magnesium Oxide 500 MG Tabs  Take 500 mg by mouth at bedtime.     metFORMIN 500 MG 24 hr tablet  Commonly known as:  GLUCOPHAGE-XR  Take 500 mg by mouth 2 (two) times daily with a meal.     montelukast 10 MG tablet  Commonly known as:  SINGULAIR  Take 10 mg by mouth daily.     omega-3 acid ethyl esters 1 G capsule  Commonly known as:  LOVAZA  Take 1 g by mouth 2 (two) times daily.     ondansetron 8 MG tablet  Commonly known as:  ZOFRAN  Take 8 mg by mouth every 8 (eight) hours as needed for nausea or vomiting (nausea).     potassium chloride SA 20 MEQ tablet  Commonly known as:  K-DUR,KLOR-CON  Take 1 tablet (20 mEq total) by mouth daily.     pravastatin 40 MG tablet  Commonly known as:  PRAVACHOL  Take 20 mg by mouth See admin instructions. Take 1/2 tablet (20 mg) ONLY on Monday, Tuesday, Wednesday, and Thursday Nights.     PROAIR HFA 108 (90 BASE) MCG/ACT inhaler  Generic drug:  albuterol    Inhale 2 puffs into the lungs every 6 (six) hours as needed for wheezing or shortness of breath.     prochlorperazine 10 MG tablet  Commonly known as:  COMPAZINE  Take 10 mg by mouth every 8 (eight) hours as needed for nausea (nausea).     traMADol-acetaminophen 37.5-325 MG per tablet  Commonly known as:  ULTRACET  Take 1  tablet by mouth every 6 (six) hours as needed.     Vitamin D 2000 UNITS tablet  Take 2,000 Units by mouth daily with lunch.     warfarin 5 MG tablet  Commonly known as:  COUMADIN  Take 2.5-5 mg by mouth daily. Take 1/2 tablet (2.5 mg) on (Sun, Tues, Thurs, Sat) and Take 1 tablet (5 mg) on (Mon, Wed, Fri)         PHYSICAL EXAMINATION  Blood pressure 128/71, pulse 85, temperature 99.3 F (37.4 C), temperature source Oral, resp. rate 20, SpO2 98 %.  Physical Exam  Constitutional: She is oriented to person, place, and time. Vital signs are normal. She appears dehydrated. She appears unhealthy. She has a sickly appearance.  HENT:  Head: Normocephalic and atraumatic.  Mouth/Throat: Oropharynx is clear and moist.  Eyes: Conjunctivae and EOM are normal. Pupils are equal, round, and reactive to light. Right eye exhibits no discharge. Left eye exhibits no discharge. No scleral icterus.  Neck: Normal range of motion. Neck supple. No JVD present. No tracheal deviation present. No thyromegaly present.  Cardiovascular: Normal rate, regular rhythm, normal heart sounds and intact distal pulses.   Pulmonary/Chest: Effort normal and breath sounds normal. No respiratory distress. She has no wheezes. She has no rales. She exhibits no tenderness.  Abdominal: Soft. Bowel sounds are normal. She exhibits no distension and no mass. There is no tenderness. There is no rebound and no guarding.  Musculoskeletal: Normal range of motion. She exhibits edema. She exhibits no tenderness.  Trace edema to bilateral ankles.  Lymphadenopathy:    She has no cervical adenopathy.  Neurological:  She is alert and oriented to person, place, and time.  Patient in wheelchair during exam.  Skin: Skin is warm and dry. No rash noted. No erythema. No pallor.  Psychiatric: Affect normal.  Nursing note and vitals reviewed.   LABORATORY DATA:. Admission on 04/09/2014  Component Date Value Ref Range Status  . WBC 04/09/2014 14.2* 4.0 - 10.5 K/uL Final  . RBC 04/09/2014 3.09* 3.87 - 5.11 MIL/uL Final  . Hemoglobin 04/09/2014 9.6* 12.0 - 15.0 g/dL Final  . HCT 04/09/2014 28.9* 36.0 - 46.0 % Final  . MCV 04/09/2014 93.5  78.0 - 100.0 fL Final  . MCH 04/09/2014 31.1  26.0 - 34.0 pg Final  . MCHC 04/09/2014 33.2  30.0 - 36.0 g/dL Final  . RDW 04/09/2014 17.3* 11.5 - 15.5 % Final  . Platelets 04/09/2014 225  150 - 400 K/uL Final  . Neutrophils Relative % 04/09/2014 92* 43 - 77 % Final  . Neutro Abs 04/09/2014 13.1* 1.7 - 7.7 K/uL Final  . Lymphocytes Relative 04/09/2014 4* 12 - 46 % Final  . Lymphs Abs 04/09/2014 0.5* 0.7 - 4.0 K/uL Final  . Monocytes Relative 04/09/2014 4  3 - 12 % Final  . Monocytes Absolute 04/09/2014 0.5  0.1 - 1.0 K/uL Final  . Eosinophils Relative 04/09/2014 0  0 - 5 % Final  . Eosinophils Absolute 04/09/2014 0.0  0.0 - 0.7 K/uL Final  . Basophils Relative 04/09/2014 0  0 - 1 % Final  . Basophils Absolute 04/09/2014 0.0  0.0 - 0.1 K/uL Final  . Sodium 04/09/2014 137  135 - 145 mmol/L Final  . Potassium 04/09/2014 3.7  3.5 - 5.1 mmol/L Final  . Chloride 04/09/2014 103  96 - 112 mmol/L Final  . CO2 04/09/2014 23  19 - 32 mmol/L Final  . Glucose, Bld 04/09/2014 149* 70 - 99 mg/dL Final  .  BUN 04/09/2014 10  6 - 23 mg/dL Final  . Creatinine, Ser 04/09/2014 0.57  0.50 - 1.10 mg/dL Final  . Calcium 04/09/2014 8.7  8.4 - 10.5 mg/dL Final  . Total Protein 04/09/2014 6.1  6.0 - 8.3 g/dL Final  . Albumin 04/09/2014 3.4* 3.5 - 5.2 g/dL Final  . AST 04/09/2014 42* 0 - 37 U/L Final  . ALT 04/09/2014 26  0 - 35 U/L Final  . Alkaline Phosphatase 04/09/2014 94  39 - 117 U/L  Final  . Total Bilirubin 04/09/2014 0.8  0.3 - 1.2 mg/dL Final  . GFR calc non Af Amer 04/09/2014 88* >90 mL/min Final  . GFR calc Af Amer 04/09/2014 >90  >90 mL/min Final   Comment: (NOTE) The eGFR has been calculated using the CKD EPI equation. This calculation has not been validated in all clinical situations. eGFR's persistently <90 mL/min signify possible Chronic Kidney Disease.   . Anion gap 04/09/2014 11  5 - 15 Final  . Lipase 04/09/2014 12  11 - 59 U/L Final  . Color, Urine 04/09/2014 YELLOW  YELLOW Final  . APPearance 04/09/2014 CLOUDY* CLEAR Final  . Specific Gravity, Urine 04/09/2014 1.013  1.005 - 1.030 Final  . pH 04/09/2014 6.5  5.0 - 8.0 Final  . Glucose, UA 04/09/2014 NEGATIVE  NEGATIVE mg/dL Final  . Hgb urine dipstick 04/09/2014 TRACE* NEGATIVE Final  . Bilirubin Urine 04/09/2014 NEGATIVE  NEGATIVE Final  . Ketones, ur 04/09/2014 NEGATIVE  NEGATIVE mg/dL Final  . Protein, ur 04/09/2014 NEGATIVE  NEGATIVE mg/dL Final  . Urobilinogen, UA 04/09/2014 1.0  0.0 - 1.0 mg/dL Final  . Nitrite 04/09/2014 NEGATIVE  NEGATIVE Final  . Leukocytes, UA 04/09/2014 MODERATE* NEGATIVE Final  . Digoxin Level 04/09/2014 0.7* 0.8 - 2.0 ng/mL Final  . Specimen Description 04/09/2014 BLOOD RIGHT PORTA CATH   Final  . Special Requests 04/09/2014 BOTTLES DRAWN AEROBIC AND ANAEROBIC 5CC EACH   Final  . Culture 04/09/2014    Final                   Value:GRAM NEGATIVE RODS Note: Gram Stain Report Called to,Read Back By and Verified With: KIM CHEEK 04/10/14 0835 BY SMITHERSJ Performed at [facility]   . Report Status 04/09/2014 PENDING   Incomplete  . Specimen Description 04/09/2014 BLOOD RIGHT WRIST   Final  . Special Requests 04/09/2014 BOTTLES DRAWN AEROBIC AND ANAEROBIC 5CC EACH   Final  . Culture 04/09/2014    Final                   Value:GRAM NEGATIVE RODS Note: Gram Stain Report Called to,Read Back By and Verified With: KIM CHEEK 04/10/14 0835 BY SMITHERSJ Performed at  [facility]   . Report Status 04/09/2014 PENDING   Incomplete  . Prothrombin Time 04/09/2014 17.4* 11.6 - 15.2 seconds Final  . INR 04/09/2014 1.41  0.00 - 1.49 Final  . Lactic Acid, Venous 04/09/2014 2.61* 0.5 - 2.0 mmol/L Final  . Troponin I 04/09/2014 0.03  <0.031 ng/mL Final   Comment:        NO INDICATION OF MYOCARDIAL INJURY.   . Lactic Acid, Venous 04/09/2014 2.65* 0.5 - 2.0 mmol/L Final  . Comment 04/09/2014 NOTIFIED PHYSICIAN   Final  . WBC, UA 04/09/2014 TOO NUMEROUS TO COUNT  <3 WBC/hpf Final   WITH CLUMPS  . RBC / HPF 04/09/2014 0-2  <3 RBC/hpf Final  . Bacteria, UA 04/09/2014 MANY* RARE Final  . WBC  04/09/2014 19.2* 4.0 - 10.5 K/uL Final  . RBC 04/09/2014 2.71* 3.87 - 5.11 MIL/uL Final  . Hemoglobin 04/09/2014 8.3* 12.0 - 15.0 g/dL Final  . HCT 04/09/2014 25.4* 36.0 - 46.0 % Final  . MCV 04/09/2014 93.7  78.0 - 100.0 fL Final  . MCH 04/09/2014 30.6  26.0 - 34.0 pg Final  . MCHC 04/09/2014 32.7  30.0 - 36.0 g/dL Final  . RDW 04/09/2014 17.5* 11.5 - 15.5 % Final  . Platelets 04/09/2014 182  150 - 400 K/uL Final  . Neutrophils Relative % 04/09/2014 86* 43 - 77 % Final  . Lymphocytes Relative 04/09/2014 5* 12 - 46 % Final  . Monocytes Relative 04/09/2014 9  3 - 12 % Final  . Eosinophils Relative 04/09/2014 0  0 - 5 % Final  . Basophils Relative 04/09/2014 0  0 - 1 % Final  . Neutro Abs 04/09/2014 16.5* 1.7 - 7.7 K/uL Final  . Lymphs Abs 04/09/2014 1.0  0.7 - 4.0 K/uL Final  . Monocytes Absolute 04/09/2014 1.7* 0.1 - 1.0 K/uL Final  . Eosinophils Absolute 04/09/2014 0.0  0.0 - 0.7 K/uL Final  . Basophils Absolute 04/09/2014 0.0  0.0 - 0.1 K/uL Final  . RBC Morphology 04/09/2014 POLYCHROMASIA PRESENT   Final  . Sodium 04/09/2014 134* 135 - 145 mmol/L Final  . Potassium 04/09/2014 3.7  3.5 - 5.1 mmol/L Final  . Chloride 04/09/2014 105  96 - 112 mmol/L Final  . CO2 04/09/2014 21  19 - 32 mmol/L Final  . Glucose, Bld 04/09/2014 144* 70 - 99 mg/dL Final  . BUN  04/09/2014 11  6 - 23 mg/dL Final  . Creatinine, Ser 04/09/2014 0.59  0.50 - 1.10 mg/dL Final  . Calcium 04/09/2014 7.6* 8.4 - 10.5 mg/dL Final  . Total Protein 04/09/2014 4.9* 6.0 - 8.3 g/dL Final  . Albumin 04/09/2014 2.6* 3.5 - 5.2 g/dL Final  . AST 04/09/2014 49* 0 - 37 U/L Final  . ALT 04/09/2014 26  0 - 35 U/L Final  . Alkaline Phosphatase 04/09/2014 70  39 - 117 U/L Final  . Total Bilirubin 04/09/2014 1.0  0.3 - 1.2 mg/dL Final  . GFR calc non Af Amer 04/09/2014 87* >90 mL/min Final  . GFR calc Af Amer 04/09/2014 >90  >90 mL/min Final   Comment: (NOTE) The eGFR has been calculated using the CKD EPI equation. This calculation has not been validated in all clinical situations. eGFR's persistently <90 mL/min signify possible Chronic Kidney Disease.   . Anion gap 04/09/2014 8  5 - 15 Final  . Lactic Acid, Venous 04/09/2014 1.6  0.5 - 2.0 mmol/L Final  . Lactic Acid, Venous 04/10/2014 1.8  0.5 - 2.0 mmol/L Final  . Procalcitonin 04/09/2014 4.61   Final   Comment:        Interpretation: PCT > 2 ng/mL: Systemic infection (sepsis) is likely, unless other causes are known. (NOTE)         ICU PCT Algorithm               Non ICU PCT Algorithm    ----------------------------     ------------------------------         PCT < 0.25 ng/mL                 PCT < 0.1 ng/mL     Stopping of antibiotics            Stopping of antibiotics       strongly encouraged.  strongly encouraged.    ----------------------------     ------------------------------       PCT level decrease by               PCT < 0.25 ng/mL       >= 80% from peak PCT       OR PCT 0.25 - 0.5 ng/mL          Stopping of antibiotics                                             encouraged.     Stopping of antibiotics           encouraged.    ----------------------------     ------------------------------       PCT level decrease by              PCT >= 0.25 ng/mL       < 80% from peak PCT        AND PCT >= 0.5 ng/mL             Continuing antibiotics                                                                        encouraged.       Continuing antibiotics            encouraged.    ----------------------------     ------------------------------     PCT level increase compared          PCT > 0.5 ng/mL         with peak PCT AND          PCT >= 0.5 ng/mL             Escalation of antibiotics                                          strongly encouraged.      Escalation of antibiotics        strongly encouraged.   . Prothrombin Time 04/09/2014 19.3* 11.6 - 15.2 seconds Final  . INR 04/09/2014 1.61* 0.00 - 1.49 Final  . aPTT 04/09/2014 38* 24 - 37 seconds Final   Comment:        IF BASELINE aPTT IS ELEVATED, SUGGEST PATIENT RISK ASSESSMENT BE USED TO DETERMINE APPROPRIATE ANTICOAGULANT THERAPY.   . Sodium 04/10/2014 135  135 - 145 mmol/L Final  . Potassium 04/10/2014 4.0  3.5 - 5.1 mmol/L Final  . Chloride 04/10/2014 105  96 - 112 mmol/L Final  . CO2 04/10/2014 21  19 - 32 mmol/L Final  . Glucose, Bld 04/10/2014 137* 70 - 99 mg/dL Final  . BUN 04/10/2014 12  6 - 23 mg/dL Final  . Creatinine, Ser 04/10/2014 0.65  0.50 - 1.10 mg/dL Final  . Calcium 04/10/2014 7.7* 8.4 - 10.5 mg/dL Final  . Total Protein 04/10/2014 5.0* 6.0 - 8.3 g/dL Final  . Albumin 04/10/2014 2.7* 3.5 - 5.2 g/dL Final  .  AST 04/10/2014 41* 0 - 37 U/L Final  . ALT 04/10/2014 25  0 - 35 U/L Final  . Alkaline Phosphatase 04/10/2014 68  39 - 117 U/L Final  . Total Bilirubin 04/10/2014 1.1  0.3 - 1.2 mg/dL Final  . GFR calc non Af Amer 04/10/2014 85* >90 mL/min Final  . GFR calc Af Amer 04/10/2014 >90  >90 mL/min Final   Comment: (NOTE) The eGFR has been calculated using the CKD EPI equation. This calculation has not been validated in all clinical situations. eGFR's persistently <90 mL/min signify possible Chronic Kidney Disease.   . Anion gap 04/10/2014 9  5 - 15 Final  . WBC 04/10/2014 17.8* 4.0 - 10.5 K/uL Final  . RBC  04/10/2014 2.65* 3.87 - 5.11 MIL/uL Final  . Hemoglobin 04/10/2014 8.1* 12.0 - 15.0 g/dL Final  . HCT 04/10/2014 24.8* 36.0 - 46.0 % Final  . MCV 04/10/2014 93.6  78.0 - 100.0 fL Final  . MCH 04/10/2014 30.6  26.0 - 34.0 pg Final  . MCHC 04/10/2014 32.7  30.0 - 36.0 g/dL Final  . RDW 04/10/2014 17.3* 11.5 - 15.5 % Final  . Platelets 04/10/2014 186  150 - 400 K/uL Final  . Neutrophils Relative % 04/10/2014 87* 43 - 77 % Final  . Neutro Abs 04/10/2014 15.4* 1.7 - 7.7 K/uL Final  . Lymphocytes Relative 04/10/2014 3* 12 - 46 % Final  . Lymphs Abs 04/10/2014 0.6* 0.7 - 4.0 K/uL Final  . Monocytes Relative 04/10/2014 10  3 - 12 % Final  . Monocytes Absolute 04/10/2014 1.8* 0.1 - 1.0 K/uL Final  . Eosinophils Relative 04/10/2014 0  0 - 5 % Final  . Eosinophils Absolute 04/10/2014 0.0  0.0 - 0.7 K/uL Final  . Basophils Relative 04/10/2014 0  0 - 1 % Final  . Basophils Absolute 04/10/2014 0.0  0.0 - 0.1 K/uL Final  . Prothrombin Time 04/10/2014 19.0* 11.6 - 15.2 seconds Final  . INR 04/10/2014 1.57* 0.00 - 1.49 Final  . Glucose-Capillary 04/10/2014 109* 70 - 99 mg/dL Final  . MRSA by PCR 04/10/2014 NEGATIVE  NEGATIVE Final   Comment:        The GeneXpert MRSA Assay (FDA approved for NASAL specimens only), is one component of a comprehensive MRSA colonization surveillance program. It is not intended to diagnose MRSA infection nor to guide or monitor treatment for MRSA infections.   . Glucose-Capillary 04/10/2014 141* 70 - 99 mg/dL Final  . Comment 1 04/10/2014 Notify RN   Final  . Comment 2 04/10/2014 Document in Chart   Final  . Glucose-Capillary 04/10/2014 110* 70 - 99 mg/dL Final  . Comment 1 04/10/2014 Notify RN   Final  . Comment 2 04/10/2014 Document in Chart   Final  Appointment on 04/09/2014  Component Date Value Ref Range Status  . WBC 04/09/2014 13.9* 3.9 - 10.3 10e3/uL Final  . NEUT# 04/09/2014 11.7* 1.5 - 6.5 10e3/uL Final  . HGB 04/09/2014 9.6* 11.6 - 15.9 g/dL Final  .  HCT 04/09/2014 29.3* 34.8 - 46.6 % Final  . Platelets 04/09/2014 232  145 - 400 10e3/uL Final  . MCV 04/09/2014 92.7  79.5 - 101.0 fL Final  . MCH 04/09/2014 30.4  25.1 - 34.0 pg Final  . MCHC 04/09/2014 32.8  31.5 - 36.0 g/dL Final  . RBC 04/09/2014 3.16* 3.70 - 5.45 10e6/uL Final  . RDW 04/09/2014 17.3* 11.2 - 14.5 % Final  . lymph# 04/09/2014 1.1  0.9 - 3.3 10e3/uL Final  .  MONO# 04/09/2014 1.0* 0.1 - 0.9 10e3/uL Final  . Eosinophils Absolute 04/09/2014 0.2  0.0 - 0.5 10e3/uL Final  . Basophils Absolute 04/09/2014 0.0  0.0 - 0.1 10e3/uL Final  . NEUT% 04/09/2014 84.4* 38.4 - 76.8 % Final  . LYMPH% 04/09/2014 7.5* 14.0 - 49.7 % Final  . MONO% 04/09/2014 6.8  0.0 - 14.0 % Final  . EOS% 04/09/2014 1.1  0.0 - 7.0 % Final  . BASO% 04/09/2014 0.2  0.0 - 2.0 % Final  . Sodium 04/09/2014 138  136 - 145 mEq/L Final  . Potassium 04/09/2014 4.3  3.5 - 5.1 mEq/L Final  . Chloride 04/09/2014 105  98 - 109 mEq/L Final  . CO2 04/09/2014 24  22 - 29 mEq/L Final  . Glucose 04/09/2014 144* 70 - 140 mg/dl Final  . BUN 04/09/2014 9.9  7.0 - 26.0 mg/dL Final  . Creatinine 04/09/2014 0.7  0.6 - 1.1 mg/dL Final  . Total Bilirubin 04/09/2014 0.43  0.20 - 1.20 mg/dL Final  . Alkaline Phosphatase 04/09/2014 100  40 - 150 U/L Final  . AST 04/09/2014 28  5 - 34 U/L Final  . ALT 04/09/2014 24  0 - 55 U/L Final  . Total Protein 04/09/2014 5.7* 6.4 - 8.3 g/dL Final  . Albumin 04/09/2014 3.3* 3.5 - 5.0 g/dL Final  . Calcium 04/09/2014 9.2  8.4 - 10.4 mg/dL Final  . Anion Gap 04/09/2014 9  3 - 11 mEq/L Final  . EGFR 04/09/2014 82* >90 ml/min/1.73 m2 Final   eGFR is calculated using the CKD-EPI Creatinine Equation (2009)  . Hold Tube, Blood Bank 04/09/2014 Blood Bank Order Cancelled   Final  . Glucose 04/09/2014 Negative  Negative mg/dL Final  . Bilirubin (Urine) 04/09/2014 Negative  Negative Final  . Ketones 04/09/2014 Negative  Negative mg/dL Final  . Specific Gravity, Urine 04/09/2014 1.010  1.003 - 1.035  Final  . Blood 04/09/2014 Trace  Negative Final  . pH 04/09/2014 7.5  4.6 - 8.0 Final  . Protein 04/09/2014 30  Negative- <30 mg/dL Final  . Urobilinogen, UR 04/09/2014 0.2  0.2 - 1 mg/dL Final  . Nitrite 04/09/2014 Negative  Negative Final  . Leukocyte Esterase 04/09/2014 Moderate  Negative Final  . RBC / HPF 04/09/2014 7-10  0 - 2 Final  . WBC, UA 04/09/2014 TNTC  0 - 2 Final  . Bacteria, UA 04/09/2014 Moderate  Negative- Trace Final  . Epithelial Cells 04/09/2014 Few  Negative- Few Final     RADIOGRAPHIC STUDIES: Ct Abdomen Pelvis W Contrast  04/09/2014   CLINICAL DATA:  Malignant lymphoma. Last chemotherapy 04/03/2014. Now complains of chills, weakness, nausea, vomiting, diarrhea. History of appendectomy, hysterectomy, cholecystectomy.  EXAM: CT ABDOMEN AND PELVIS WITH CONTRAST  TECHNIQUE: Multidetector CT imaging of the abdomen and pelvis was performed using the standard protocol following bolus administration of intravenous contrast.  CONTRAST:  167m OMNIPAQUE IOHEXOL 300 MG/ML  SOLN  COMPARISON:  PET-CT 02/26/2014.  CT abdomen and pelvis 07/01/2010.  FINDINGS: Atelectasis or infiltration in both lung bases. Mild cardiac enlargement.  Diffuse fatty infiltration of the liver. Surgical absence of the gallbladder. No bile duct dilatation. Pancreas is atrophic. Multiple tiny poorly defined low-attenuation lesions in the spleen. This could be related to lymphoma or infections. Soft tissue filling with poor definition of fat around the celiac axis. No discrete lymph node is demonstrated. This is likely related to lymphoma. Small amount of free fluid/ edema in the upper abdomen and mesenteric. This is been present  previously and is likely reactive or inflammatory. Scattered mesenteric and retroperitoneal lymph nodes are not pathologically enlarged. Calcification of the aorta without aneurysm. The inferior vena cava is unremarkable. Parapelvic cysts on the left kidney. No hydronephrosis in either  kidney. Nephrograms are somewhat heterogeneous bilaterally with evidence of thickening of the wall of the right ureter. Changes are nonspecific but could indicate pyelonephritis. Clinical correlation suggested. Stomach is decompressed. Small bowel are not abnormally distended. Colon is decompressed but there is suggestion of colonic wall thickening involving the ascending colon and transverse region. Colitis is not excluded. No free air in the abdomen.  Pelvis: Bladder is decompressed. Gas within the bladder is probably due to instrumentation. Fistula or infection could also have this appearance. Uterus is surgically absent. No loculated pelvic fluid collections. Diverticula in the sigmoid colon without evidence of diverticulitis. Degenerative changes in the lumbar spine. No destructive bone lesions appreciated.  IMPRESSION: Diffuse fatty infiltration of the liver. Multiple tiny poorly defined low-attenuation lesions in the spleen. This could be related to lymphoma or infection. Soft tissue around the celiac axis is probably related to lymphoma. Abdominal and retroperitoneal lymph nodes are not pathologically enlarged. Heterogeneous renal nephrograms suggest possible pyelonephritis. Gas in the bladder may be due to infection. Pericolonic wall thickening suggest colitis. Small amount of free fluid in the abdomen.   Electronically Signed   By: Lucienne Capers M.D.   On: 04/09/2014 20:30   Dg Chest Port 1 View  04/09/2014   CLINICAL DATA:  Emesis and diarrhea.  EXAM: PORTABLE CHEST - 1 VIEW  COMPARISON:  01/26/2014  FINDINGS: Mild cardiomegaly is stable from prior. Aortic hilar contours are unremarkable. Right IJ porta catheter is in stable position, tip at the upper cavoatrial junction. There is interstitial coarsening without edema, consolidation, effusion, or pneumothorax.  IMPRESSION: No active disease.   Electronically Signed   By: Monte Fantasia M.D.   On: 04/09/2014 18:49    ASSESSMENT/PLAN:     Dehydration Patient reports multiple episodes of nausea and vomiting; as well as chronic diarrhea for the past 48 hours.  Patient does appear dehydrated today.  The patient received gentle rehydration while at the [facility] today; receiving approximately 750 ML's normal saline IV fluid rehydration.     Diarrhea Patient complains of intermittent, chronic diarrhea for the past several days.  She has tried Lomotil at home with minimal effectiveness.   Long term current use of anticoagulant therapy Patient has a history of chronic atrial fib; and continues on Coumadin as previously directed.   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  She is scheduled for cycle 2 of the same regimen on 06/06/2014.  Patient has plans to return to the [facility] for labs and a follow-up visit on 04/25/2014.   Nausea with vomiting Patient complains of multiple episodes of nausea/vomiting for the past 24-48 hours.  She has tried Compazine at home with minimal effectiveness.  Patient was given IV fluid rehydration while at the [facility] today; as well as Zofran 8 mg IV.  Patient continued with extreme nausea and dry heaving.     UTI (urinary tract infection)  Patient complaining of mild dysuria and urinary hesitancy for the past 24 hours.  She also notes a low-grade temperature to 99.3. Urinalysis today did reveal a urinary tract infection.  Culture obtained ; and awaiting results.    given patient's continued nausea/vomiting /dry heaving , urinary  tract infection, and fever- will transport patient to the emergency department for further evaluation and management. Brief  History and report was called to emergency department charge nurse [facility] prior to patient being transported to the emergency department per [facility] via wheelchair.   Patient stated understanding of all instructions; and was in  agreement with this plan of care. The patient knows to call the clinic with any problems, questions or concerns.   Review/collaboration with Dr. Jana Hakim (on call for Dr. Alvy Bimler)  regarding all aspects of patient's visit today.   Total time spent with patient was 40 minutes;  with greater than 75 percent of that time spent in face to face counseling regarding patient's symptoms,  and coordination of care and follow up.  Disclaimer: This note was dictated with voice recognition software. Similar sounding words can inadvertently be transcribed and may not be corrected upon review.   Drue Second, NP 04/10/2014

## 2014-04-10 NOTE — Assessment & Plan Note (Signed)
Patient received her first cycle of Rituxan chemotherapy on 04/03/2014.  She is scheduled for cycle 2 of the same regimen on 06/06/2014.  Patient has plans to return to the [facility] for labs and a follow-up visit on 04/25/2014.

## 2014-04-10 NOTE — Assessment & Plan Note (Signed)
Patient complains of intermittent, chronic diarrhea for the past several days.  She has tried Lomotil at home with minimal effectiveness.

## 2014-04-10 NOTE — Assessment & Plan Note (Signed)
Patient complains of multiple episodes of nausea/vomiting for the past 24-48 hours.  She has tried Compazine at home with minimal effectiveness.  Patient was given IV fluid rehydration while at the [facility] today; as well as Zofran 8 mg IV.  Patient continued with extreme nausea and dry heaving.

## 2014-04-10 NOTE — Progress Notes (Signed)
Nutrition Brief Note  Patient identified on the Malnutrition Screening Tool (MST) Report  Wt Readings from Last 15 Encounters:  04/09/14 143 lb 4.8 oz (65 kg)  03/27/14 132 lb 11.2 oz (60.192 kg)  03/21/14 137 lb 11.2 oz (62.46 kg)  03/14/14 131 lb (59.421 kg)  02/27/14 131 lb 6.4 oz (59.603 kg)  02/13/14 126 lb 11.2 oz (57.471 kg)  02/07/14 137 lb 12.8 oz (62.506 kg)  01/29/14 147 lb 4.3 oz (66.8 kg)  01/10/14 138 lb (62.596 kg)  01/08/14 137 lb 3.2 oz (62.234 kg)  12/28/13 137 lb 9.6 oz (62.415 kg)  12/26/13 137 lb (62.143 kg)  12/07/13 134 lb (60.782 kg)  11/26/13 134 lb 6 oz (60.952 kg)  01/08/13 139 lb (63.05 kg)    Body mass index is 23.14 kg/(m^2). Patient meets criteria for normal body weight based on current BMI.   Current diet order is clear liquids, patient is consuming approximately 100% of meals at this time. Pt reports that her nausea has improved. She is getting her appetite back. Denied need for nutritional supplements. No wt loss. Labs and medications reviewed.   No nutrition interventions warranted at this time. If nutrition issues arise, please consult RD.   Laurette Schimke MS, RD, LDN

## 2014-04-10 NOTE — Progress Notes (Addendum)
[facility] Progress Note   Patty [name] PJK:[id number] DOB: 1940/04/16 DOA: 04/09/2014 PCP: Patty Pel, MD  Brief narrative: Patty [name] is a 74 y.o. female past medical history of lymphoma on chemotherapy, atrial fibrillation on coumadin, type 2 DM, hx/o septic shock, hx/o ecoli bacteremia who came into the hospital for chills and vomiting. She had about 4 loose stools the day before other symptoms started but then resolved on the day of admission. No fevers. No c/u dysuria but UA is grossly positive. H/o bladder tack.    Subjective: No complaints currently.   Assessment/Plan: Principal Problem:   Sepsis--Bacteremia - blood cx + for gr neg rods- will repeat cultures- f/u for sensitivities- cont Levaquin- d/c Vanc and Azactam - source most likely urine.  - had same presentation in early Dec with e coli bacteremia Active Problems:    Pyelonephritis   - as above    Colitis? Diarrhea  - noted to have colon thickening on CT- await C diff before stopping Flagyl - had diarrhea at home that was thought to be related to side effect from chemo and Lomotil prescribed by Patty [name] on 2/4 - start clears   Atrial fibrillation / Long-term (current) use of anticoagulants Continue digoxin and Coumadin per pharmacy.   Hypothyroidism Continue Synthroid.   Diabetes mellitus type 2, controlled, without complications Resume Metformin at d/c.   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 Alvy [name].   Code Status: no intubation, no Bipap, no defibrillation or ACLS meds- CPR only Family Communication:  Disposition Plan: home when stable DVT prophylaxis: Coumadin  Consultants: none  Procedures: none  Antibiotics: Anti-infectives    Start     Dose/Rate Route Frequency Ordered Stop   04/10/14 2000  levofloxacin (LEVAQUIN) IVPB 750 mg     750 mg 100 mL/hr over 90 Minutes Intravenous Every 24 hours 04/09/14 1841      04/10/14 0800  vancomycin (VANCOCIN) 500 mg in sodium chloride 0.9 % 100 mL IVPB  Status:  Discontinued     500 mg 100 mL/hr over 60 Minutes Intravenous Every 12 hours 04/09/14 1841 04/10/14 0842   04/10/14 0600  metroNIDAZOLE (FLAGYL) IVPB 500 mg     500 mg 100 mL/hr over 60 Minutes Intravenous Every 8 hours 04/09/14 2228     04/10/14 0300  aztreonam (AZACTAM) 2 g in dextrose 5 % 50 mL IVPB  Status:  Discontinued     2 g 100 mL/hr over 30 Minutes Intravenous Every 8 hours 04/09/14 1841 04/10/14 0841   04/09/14 2230  levofloxacin (LEVAQUIN) IVPB 750 mg  Status:  Discontinued     750 mg 100 mL/hr over 90 Minutes Intravenous  Once 04/09/14 2221 04/10/14 0629   04/09/14 2230  metroNIDAZOLE (FLAGYL) IVPB 500 mg     500 mg 100 mL/hr over 60 Minutes Intravenous  Once 04/09/14 2221 04/09/14 2338   04/09/14 1830  levofloxacin (LEVAQUIN) IVPB 750 mg     750 mg 100 mL/hr over 90 Minutes Intravenous  Once 04/09/14 1820 04/09/14 1920   04/09/14 1830  aztreonam (AZACTAM) 2 g in dextrose 5 % 50 mL IVPB     2 g 100 mL/hr over 30 Minutes Intravenous  Once 04/09/14 1820 04/09/14 1856   04/09/14 1830  vancomycin (VANCOCIN) IVPB 1000 mg/200 mL premix     1,000 mg 200 mL/hr over 60 Minutes Intravenous  Once 04/09/14 1820 04/09/14 2049         Objective: Danley Danker  Weights   04/09/14 2300  Weight: 65 kg (143 lb 4.8 oz)    Intake/Output Summary (Last 24 hours) at 04/10/14 1050 Last data filed at 04/10/14 0651  Gross per 24 hour  Intake 2888.33 ml  Output    376 ml  Net 2512.33 ml     Vitals Filed Vitals:   04/10/14 0600 04/10/14 0800 04/10/14 0845 04/10/14 1033  BP: 121/56   121/51  Pulse: 78   77  Temp:  98.2 F (36.8 C)    TempSrc:  Oral    Resp: 25     Height:      Weight:      SpO2: 96%  99%     Exam: General: AAO x3,  No acute respiratory distress Lungs: Clear to auscultation bilaterally without wheezes or crackles Cardiovascular: Regular rate and rhythm without murmur gallop  or rub normal S1 and S2 Abdomen: tender in RLQ, nondistended, soft, bowel sounds positive, no rebound, no ascites, no appreciable mass Extremities: No significant cyanosis, clubbing, or edema bilateral lower extremities  Data Reviewed: Basic Metabolic Panel:  Recent Labs Lab 04/09/14 1328 04/09/14 1747 04/09/14 2226 04/10/14 0150  NA 138 137 134* 135  K 4.3 3.7 3.7 4.0  CL  --  103 105 105  CO2 24 23 21 21   GLUCOSE 144* 149* 144* 137*  BUN 9.9 10 11 12   CREATININE 0.7 0.57 0.59 0.65  CALCIUM 9.2 8.7 7.6* 7.7*   Liver Function Tests:  Recent Labs Lab 04/09/14 1328 04/09/14 1747 04/09/14 2226 04/10/14 0150  AST 28 42* 49* 41*  ALT 24 26 26 25   ALKPHOS 100 94 70 68  BILITOT 0.43 0.8 1.0 1.1  PROT 5.7* 6.1 4.9* 5.0*  ALBUMIN 3.3* 3.4* 2.6* 2.7*    Recent Labs Lab 04/09/14 1747  LIPASE 12   No results for input(s): AMMONIA in the last 168 hours. CBC:  Recent Labs Lab 04/09/14 1326 04/09/14 1747 04/09/14 2226 04/10/14 0150  WBC 13.9* 14.2* 19.2* 17.8*  NEUTROABS 11.7* 13.1* 16.5* 15.4*  HGB 9.6* 9.6* 8.3* 8.1*  HCT 29.3* 28.9* 25.4* 24.8*  MCV 92.7 93.5 93.7 93.6  PLT 232 225 182 186   Cardiac Enzymes:  Recent Labs Lab 04/09/14 1748  TROPONINI 0.03   BNP (last 3 results) No results for input(s): BNP in the last 8760 hours.  ProBNP (last 3 results)  Recent Labs  01/20/14 1035  PROBNP 36152.0*    CBG:  Recent Labs Lab 04/10/14 0008 04/10/14 0343 04/10/14 0741  GLUCAP 109* 141* 110*    Recent Results (from the past 240 hour(s))  Culture, blood (routine x 2)     Status: None (Preliminary result)   Collection Time: 04/09/14  5:48 PM  Result Value Ref Range Status   Specimen Description BLOOD RIGHT PORTA CATH  Final   Special Requests BOTTLES DRAWN AEROBIC AND ANAEROBIC 5CC EACH  Final   Culture   Final    GRAM NEGATIVE RODS Note: Gram Stain Report Called to,Read Back By and Verified With: KIM CHEEK 04/10/14 0835 BY  [email or web address] Performed at [facility]    Report Status PENDING  Incomplete  Culture, blood (routine x 2)     Status: None (Preliminary result)   Collection Time: 04/09/14  5:48 PM  Result Value Ref Range Status   Specimen Description BLOOD RIGHT WRIST  Final   Special Requests BOTTLES DRAWN AEROBIC AND ANAEROBIC 5CC EACH  Final   Culture   Final    GRAM NEGATIVE  RODS Note: Gram Stain Report Called to,Read Back By and Verified With: KIM CHEEK 04/10/14 0835 BY SMITHERSJ Performed at [facility]    Report Status PENDING  Incomplete  MRSA PCR Screening     Status: None   Collection Time: 04/10/14  2:16 AM  Result Value Ref Range Status   MRSA by PCR NEGATIVE NEGATIVE Final    Comment:        The GeneXpert MRSA Assay (FDA approved for NASAL specimens only), is one component of a comprehensive MRSA colonization surveillance program. It is not intended to diagnose MRSA infection nor to guide or monitor treatment for MRSA infections.      Studies:  Recent x-ray studies have been reviewed in detail by the Attending Physician  Scheduled Meds:  Scheduled Meds: . digoxin  125 mcg Oral Daily  . diltiazem  120 mg Oral Daily  . insulin aspart  0-9 Units Subcutaneous 6 times per day  . levofloxacin (LEVAQUIN) IV  750 mg Intravenous Q24H  . levothyroxine  250 mcg Oral QAC breakfast  . metronidazole  500 mg Intravenous Q8H  . mometasone-formoterol  2 puff Inhalation BID  . promethazine  12.5 mg Intravenous Once  . sodium chloride  3 mL Intravenous Q12H  . [COMPLETED] warfarin  5 mg Oral ONCE-1800  . Warfarin - Pharmacist Dosing Inpatient   Does not apply q1800   Continuous Infusions: . sodium chloride 100 mL/hr at 04/10/14 8333    Time spent on care of this patient: 35 min   Churchill, MD 04/10/2014, 10:50 AM  LOS: 1 day   [facility] Office  [phone] Pager - Text Page per [email or web address]  If 7PM-7AM, please contact  night-coverage [email or web address]

## 2014-04-10 NOTE — Assessment & Plan Note (Signed)
Patient reports multiple episodes of nausea and vomiting; as well as chronic diarrhea for the past 48 hours.  Patient does appear dehydrated today.  The patient received gentle rehydration while at the [facility] today; receiving approximately 750 ML's normal saline IV fluid rehydration.

## 2014-04-10 NOTE — Progress Notes (Signed)
Patient resting quietly, respiration even, non labored. Noted O2 sat's at 88-90 % on room air, applied oxygen at 1 liter nasal canal

## 2014-04-10 NOTE — Progress Notes (Signed)
CARE MANAGEMENT NOTE 04/10/2014  Patient:  Patty [name], Patty [name]   Account Number:  [id number]  Date Initiated:  04/10/2014  Documentation initiated by:  DAVIS,RHONDA  Subjective/Objective Assessment:   sepsis     Action/Plan:   home when stable oncology patient   Anticipated DC Date:  04/13/2014   Anticipated DC Plan:  HOME/SELF CARE  In-house referral  NA      DC Planning Services  CM consult      PAC Choice  NA   Choice offered to / List presented to:  NA   DME arranged  NA      DME agency  NA     [place] arranged  NA      [place] agency  NA   Status of service:  In process, will continue to follow Medicare Important Message given?   (If response is "NO", the following Medicare IM given date fields will be blank) Date Medicare IM given:   Medicare IM given by:   Date Additional Medicare IM given:   Additional Medicare IM given by:    Discharge Disposition:    Per UR Regulation:  Reviewed for med. necessity/level of care/duration of stay  If discussed at [place] of Stay Meetings, dates discussed:    Comments:  04/10/2014/Rhonda L. Rosana Hoes, RN, BSN, CCM: Chart review for medical necessity and patient discharge needs. Case Manager will follow for patient condition changes. [phone]:

## 2014-04-10 NOTE — Progress Notes (Signed)
Pt still with n/v after given zofran and compazine. MD called and gave 1 time order for phenergan 12.5mg  iv.

## 2014-04-10 NOTE — Progress Notes (Signed)
ANTICOAGULATION CONSULT NOTE - Initial Consult  Pharmacy Consult for warfarin Indication: atrial fibrillation  Allergies  Allergen Reactions  . Morphine And Related Anaphylaxis and Other (See Comments)    Pt states she stopped breathing post op- "went into resp. arrest"  . Demerol Nausea And Vomiting  . Meperidine Hcl Nausea And Vomiting  . Multaq [Dronedarone] Other (See Comments)    Reaction:  Blood in urine and elevated liver enzymes.   Jeanie Cooks Allergy] Rash  . Penicillins Itching and Rash  . Sulfa Drugs Cross Reactors Rash    Patient Measurements: Height: 5\' 6"  (167.6 cm) Weight: 143 lb 4.8 oz (65 kg) IBW/kg (Calculated) : 59.3 Heparin Dosing Weight:   Vital Signs: Temp: 98 F (36.7 C) (02/09 2241) Temp Source: Oral (02/09 2241) BP: 122/56 mmHg (02/10 0300) Pulse Rate: 98 (02/10 0300)  Labs:  Recent Labs  04/09/14 1747 04/09/14 1748 04/09/14 2226 04/10/14 0150  HGB 9.6*  --  8.3* 8.1*  HCT 28.9*  --  25.4* 24.8*  PLT 225  --  182 186  APTT  --   --  38*  --   LABPROT 17.4*  --  19.3* 19.0*  INR 1.41  --  1.61* 1.57*  CREATININE 0.57  --  0.59 0.65  TROPONINI  --  0.03  --   --     Estimated Creatinine Clearance: 56.9 mL/min (by C-G formula based on Cr of 0.65).   Medical History: Past Medical History  Diagnosis Date  . Asthma 06/15/2010  . Hypothyroidism 06/15/2010  . Hyperlipemia 06/15/2010  . Atrial fibrillation 05/28/2010    Managed with rate control and coumadin  . Long-term (current) use of anticoagulants   . ACE-inhibitor cough   . DI (detrusor instability)   . Fibroid   . Rectocele   . Atrophic vaginitis   . 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 Diabetes mellitus     Type 2  . Peripheral neuropathy   . Hypertension   . Dysrhythmia     cardioversion - 2012  . Complication of anesthesia 1980's    post anesth.- states she "went into resp. arrest" , but then remarked that she thought maybe they gave her too much medicine  (morphine)   . Shortness of breath     sometimes   . 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 Anemia   . 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 CPAP (continuous positive airway pressure) dependence   . GERD (gastroesophageal reflux disease)   . Hearing loss   . Sleep apnea     uses CPAP everynight- last study in [place] 5 yrs. or more   . Severe sepsis with septic shock 01/21/2014  . E coli bacteremia 01/20/2014    Medications:  Prescriptions prior to admission  Medication Sig Dispense Refill Last Dose  . acetaminophen (TYLENOL) 500 MG tablet Take 1,000 mg by mouth every 6 (six) hours as needed for mild pain (pain).    04/09/2014 at Unknown time  . Cholecalciferol (VITAMIN D) 2000 UNITS tablet Take 2,000 Units by mouth daily with lunch.   04/09/2014 at Unknown time  . digoxin (LANOXIN) 0.125 MG tablet Take 125 mcg by mouth daily.    04/09/2014 at 0930  . diltiazem (CARDIZEM CD) 120 MG 24 hr capsule Take 120 mg by mouth daily.    04/09/2014 at Unknown time  . diphenhydramine-acetaminophen (TYLENOL PM) 25-500 MG TABS Take 1 tablet by mouth at bedtime as needed (sleep).  04/08/2014 at Unknown time  . diphenoxylate-atropine (LOMOTIL) 2.5-0.025 MG per tablet Take 1 tablet by mouth 4 (four) times daily as needed for diarrhea or loose stools. 90 tablet 0 04/09/2014 at Unknown time  . ezetimibe (ZETIA) 10 MG tablet Take 10 mg by mouth at bedtime.    04/08/2014 at Unknown time  . Fluticasone-Salmeterol (ADVAIR) 250-50 MCG/DOSE AEPB Inhale 1 puff into the lungs every 12 (twelve) hours.    04/09/2014 at Unknown time  . levothyroxine (SYNTHROID, LEVOTHROID) 125 MCG tablet Take 250 mcg by mouth daily before breakfast.    04/09/2014 at Unknown time  . lidocaine-prilocaine (EMLA) cream Apply 1 application topically as needed. Apply to [facility] a Cath site at least one hour prior to needle stick as needed. 30 g 3 Past Week at Unknown time  . Magnesium Oxide 500 MG TABS Take 500 mg by mouth at bedtime.   04/08/2014 at Unknown  time  . metFORMIN (GLUCOPHAGE-XR) 500 MG 24 hr tablet Take 500 mg by mouth 2 (two) times daily with a meal.   04/09/2014 at Unknown time  . montelukast (SINGULAIR) 10 MG tablet Take 10 mg by mouth daily.   04/09/2014 at Unknown time  . omega-3 acid ethyl esters (LOVAZA) 1 G capsule Take 1 g by mouth 2 (two) times daily.   04/08/2014 at Unknown time  . ondansetron (ZOFRAN) 8 MG tablet Take 8 mg by mouth every 8 (eight) hours as needed for nausea or vomiting (nausea).    04/08/2014 at Unknown time  . potassium chloride SA (K-DUR,KLOR-CON) 20 MEQ tablet Take 1 tablet (20 mEq total) by mouth daily. 30 tablet 5 04/09/2014 at Unknown time  . pravastatin (PRAVACHOL) 40 MG tablet Take 20 mg by mouth See admin instructions. Take 1/2 tablet (20 mg) ONLY on Monday, Tuesday, Wednesday, and Thursday Nights.   04/08/2014 at Unknown time  . prochlorperazine (COMPAZINE) 10 MG tablet Take 10 mg by mouth every 8 (eight) hours as needed for nausea (nausea).    04/09/2014 at Unknown time  . warfarin (COUMADIN) 5 MG tablet Take 2.5-5 mg by mouth daily. Take 1/2 tablet (2.5 mg) on (Sun, Tues, Thurs, Sat) and Take 1 tablet (5 mg) on (Mon, Wed, Fri)   04/08/2014 at 2200  . albuterol (PROAIR HFA) 108 (90 BASE) MCG/ACT inhaler Inhale 2 puffs into the lungs every 6 (six) hours as needed for wheezing or shortness of breath.   unknown at unknown time  . conjugated estrogens (PREMARIN) vaginal cream Place 1 Applicatorful vaginally 3 (three) times a week. Monday, Wednesday, and Friday nights   04/05/2014 at unknown time  . traMADol-acetaminophen (ULTRACET) 37.5-325 MG per tablet Take 1 tablet by mouth every 6 (six) hours as needed. 30 tablet 0 unknown at unknown time   Scheduled:  . aztreonam  2 g Intravenous Q8H  . digoxin  125 mcg Oral Daily  . diltiazem  120 mg Oral Daily  . insulin aspart  0-9 Units Subcutaneous 6 times per day  . levofloxacin (LEVAQUIN) IV  750 mg Intravenous Q24H  . levofloxacin (LEVAQUIN) IV  750 mg Intravenous Once  .  levothyroxine  250 mcg Oral QAC breakfast  . metronidazole  500 mg Intravenous Q8H  . mometasone-formoterol  2 puff Inhalation BID  . promethazine  12.5 mg Intravenous Once  . sodium chloride  3 mL Intravenous Q12H  . vancomycin  500 mg Intravenous Q12H  . Warfarin - Pharmacist Dosing Inpatient   Does not apply q1800    Assessment: Patient  with low INR on admit even being on warfarin PTA for afib.    Goal of Therapy:  INR 2-3    Plan:  Start with Coumadin 5 mg 2/9 PM. (done) Check PT/INR daily. Provide Coumadin education.   Tyler Deis, Shea Stakes [place] 04/10/2014,4:14 AM

## 2014-04-10 NOTE — Progress Notes (Signed)
ANTICOAGULATION CONSULT NOTE - Follow Up Consult  Pharmacy Consult for Warfarin Indication: atrial fibrillation  Allergies  Allergen Reactions  . Morphine And Related Anaphylaxis and Other (See Comments)    Pt states she stopped breathing post op- "went into resp. arrest"  . Demerol Nausea And Vomiting  . Meperidine Hcl Nausea And Vomiting  . Multaq [Dronedarone] Other (See Comments)    Reaction:  Blood in urine and elevated liver enzymes.   Jeanie Cooks Allergy] Rash  . Penicillins Itching and Rash  . Sulfa Drugs Cross Reactors Rash    Patient Measurements: Height: 5\' 6"  (167.6 cm) Weight: 143 lb 4.8 oz (65 kg) IBW/kg (Calculated) : 59.3  Vital Signs: Temp: 98.2 F (36.8 C) (02/10 0400) Temp Source: Oral (02/10 0400) BP: 121/56 mmHg (02/10 0600) Pulse Rate: 78 (02/10 0600)  Labs:  Recent Labs  04/09/14 1747 04/09/14 1748 04/09/14 2226 04/10/14 0150  HGB 9.6*  --  8.3* 8.1*  HCT 28.9*  --  25.4* 24.8*  PLT 225  --  182 186  APTT  --   --  38*  --   LABPROT 17.4*  --  19.3* 19.0*  INR 1.41  --  1.61* 1.57*  CREATININE 0.57  --  0.59 0.65  TROPONINI  --  0.03  --   --     Estimated Creatinine Clearance: 56.9 mL/min (by C-G formula based on Cr of 0.65).   Medications:  Scheduled:  . aztreonam  2 g Intravenous Q8H  . digoxin  125 mcg Oral Daily  . diltiazem  120 mg Oral Daily  . insulin aspart  0-9 Units Subcutaneous 6 times per day  . levofloxacin (LEVAQUIN) IV  750 mg Intravenous Q24H  . levothyroxine  250 mcg Oral QAC breakfast  . metronidazole  500 mg Intravenous Q8H  . mometasone-formoterol  2 puff Inhalation BID  . promethazine  12.5 mg Intravenous Once  . sodium chloride  3 mL Intravenous Q12H  . vancomycin  500 mg Intravenous Q12H  . Warfarin - Pharmacist Dosing Inpatient   Does not apply q1800   Infusions:  . sodium chloride 100 mL/hr at 04/09/14 2237   PRN: albuterol, ondansetron **OR** ondansetron (ZOFRAN) IV,  prochlorperazine  Assessment: 74 yo female with lymphoma on chemotherapy admitted 2/9 with worsening nausea, vomiting, diarrhea, abd pain and sepsis. She is on chronic warfarin for afib. Her INR is subtherapeutic on admission (1.61). Pharmacy is consulted to dose warfarin while in hospital.  Home dose reported as 2.5 mg on Sun/Tues/Thurs/Sat, 5 mg on Mon/Wed/Fri   Given a boosted dose of 5mg  early this AM at 00:20  INR: decreased this AM to 1.57   CBC: Hgb decreased to 8.1, Plts decreased to 186k  Drug interactions: flagyl and levaquin can significantly inhibit warfarin metabolism and therefore decrease warfarin requirements  Goal of Therapy:  INR 2-3   Plan:   Repeat warfarin 5mg  PO x 1 at 1800 today  Daily PT/INR  Defer decision to add Lovenox bridge to MD, but suspect INR to rise quickly given presence of multiple drug interactions  Peggyann Juba, PharmD, BCPS Pager: [phone] 04/10/2014,7:53 AM

## 2014-04-11 LAB — COMPREHENSIVE METABOLIC PANEL
ALK PHOS: 64 U/L (ref 39–117)
ALT: 22 U/L (ref 0–35)
ANION GAP: 6 (ref 5–15)
AST: 23 U/L (ref 0–37)
Albumin: 2.7 g/dL — ABNORMAL LOW (ref 3.5–5.2)
BILIRUBIN TOTAL: 0.7 mg/dL (ref 0.3–1.2)
BUN: 9 mg/dL (ref 6–23)
CALCIUM: 8.1 mg/dL — AB (ref 8.4–10.5)
CO2: 23 mmol/L (ref 19–32)
CREATININE: 0.51 mg/dL (ref 0.50–1.10)
Chloride: 108 mmol/L (ref 96–112)
GFR calc Af Amer: >90 mL/min (ref >90)
GFR calc non Af Amer: >90 mL/min (ref >90)
GLUCOSE: 135 mg/dL — AB (ref 70–99)
Potassium: 3.3 mmol/L — ABNORMAL LOW (ref 3.5–5.1)
SODIUM: 137 mmol/L (ref 135–145)
Total Protein: 5.1 g/dL — ABNORMAL LOW (ref 6.0–8.3)

## 2014-04-11 LAB — CBC WITH DIFFERENTIAL/PLATELET
BASOS ABS: 0 10*3/uL (ref 0.0–0.1)
Basophils Relative: 0 % (ref 0–1)
EOS PCT: 0 % (ref 0–5)
Eosinophils Absolute: 0 10*3/uL (ref 0.0–0.7)
HCT: 24.1 % — ABNORMAL LOW (ref 36.0–46.0)
Hemoglobin: 7.9 g/dL — ABNORMAL LOW (ref 12.0–15.0)
Lymphocytes Relative: 8 % — ABNORMAL LOW (ref 12–46)
Lymphs Abs: 1 10*3/uL (ref 0.7–4.0)
MCH: 30.6 pg (ref 26.0–34.0)
MCHC: 32.8 g/dL (ref 30.0–36.0)
MCV: 93.4 fL (ref 78.0–100.0)
Monocytes Absolute: 1.6 10*3/uL — ABNORMAL HIGH (ref 0.1–1.0)
Monocytes Relative: 12 % (ref 3–12)
Neutro Abs: 10.2 10*3/uL — ABNORMAL HIGH (ref 1.7–7.7)
Neutrophils Relative %: 80 % — ABNORMAL HIGH (ref 43–77)
PLATELETS: 149 10*3/uL — AB (ref 150–400)
RBC: 2.58 MIL/uL — AB (ref 3.87–5.11)
RDW: 17.2 % — AB (ref 11.5–15.5)
WBC: 12.9 10*3/uL — ABNORMAL HIGH (ref 4.0–10.5)

## 2014-04-11 LAB — GLUCOSE, CAPILLARY
GLUCOSE-CAPILLARY: 125 mg/dL — AB (ref 70–99)
GLUCOSE-CAPILLARY: 133 mg/dL — AB (ref 70–99)
GLUCOSE-CAPILLARY: 137 mg/dL — AB (ref 70–99)
GLUCOSE-CAPILLARY: 156 mg/dL — AB (ref 70–99)
Glucose-Capillary: 114 mg/dL — ABNORMAL HIGH (ref 70–99)
Glucose-Capillary: 118 mg/dL — ABNORMAL HIGH (ref 70–99)

## 2014-04-11 LAB — HEMOGLOBIN A1C
HEMOGLOBIN A1C: 5.7 % — AB (ref 4.8–5.6)
MEAN PLASMA GLUCOSE: 117 mg/dL

## 2014-04-11 LAB — PROTIME-INR
INR: 1.5 — ABNORMAL HIGH (ref 0.00–1.49)
Prothrombin Time: 18.2 seconds — ABNORMAL HIGH (ref 11.6–15.2)

## 2014-04-11 MED ORDER — MONTELUKAST SODIUM 10 MG PO TABS
10.0000 mg | ORAL_TABLET | Freq: Every day | ORAL | Status: DC
  Administered 2014-04-11 – 2014-04-12 (×2): 10 mg via ORAL
  Filled 2014-04-11 (×2): qty 1

## 2014-04-11 MED ORDER — INSULIN ASPART 100 UNIT/ML ~~LOC~~ SOLN
0.0000 [IU] | Freq: Three times a day (TID) | SUBCUTANEOUS | Status: DC
  Administered 2014-04-11: 2 [IU] via SUBCUTANEOUS
  Administered 2014-04-12: 1 [IU] via SUBCUTANEOUS

## 2014-04-11 MED ORDER — DIPHENHYDRAMINE HCL 25 MG PO CAPS: 25.0000 mg | ORAL_CAPSULE | Freq: Every day | ORAL | Status: DC

## 2014-04-11 MED ORDER — DIPHENHYDRAMINE HCL 25 MG PO CAPS
25.0000 mg | ORAL_CAPSULE | Freq: Every day | ORAL | Status: DC
  Administered 2014-04-11: 25 mg via ORAL
  Filled 2014-04-11 (×2): qty 1

## 2014-04-11 MED ORDER — ACETAMINOPHEN 325 MG PO TABS
650.0000 mg | ORAL_TABLET | Freq: Four times a day (QID) | ORAL | Status: DC | PRN
  Administered 2014-04-11: 650 mg via ORAL
  Filled 2014-04-11: qty 2

## 2014-04-11 MED ORDER — WARFARIN SODIUM 7.5 MG PO TABS
7.5000 mg | ORAL_TABLET | Freq: Once | ORAL | Status: AC
  Administered 2014-04-11: 7.5 mg via ORAL
  Filled 2014-04-11: qty 1

## 2014-04-11 MED ORDER — POTASSIUM CHLORIDE CRYS ER 20 MEQ PO TBCR
20.0000 meq | EXTENDED_RELEASE_TABLET | Freq: Every day | ORAL | Status: DC
  Administered 2014-04-11: 20 meq via ORAL
  Filled 2014-04-11 (×2): qty 1

## 2014-04-11 NOTE — Progress Notes (Signed)
ANTICOAGULATION CONSULT NOTE - Follow Up Consult  Pharmacy Consult for Warfarin Indication: atrial fibrillation  Allergies  Allergen Reactions  . Morphine And Related Anaphylaxis and Other (See Comments)    Pt states she stopped breathing post op- "went into resp. arrest"  . Demerol Nausea And Vomiting  . Meperidine Hcl Nausea And Vomiting  . Multaq [Dronedarone] Other (See Comments)    Reaction:  Blood in urine and elevated liver enzymes.   Jeanie Cooks Allergy] Rash  . Penicillins Itching and Rash  . Sulfa Drugs Cross Reactors Rash    Patient Measurements: Height: 5\' 6"  (167.6 cm) Weight: 143 lb 4.8 oz (65 kg) IBW/kg (Calculated) : 59.3  Vital Signs: Temp: 98.5 F (36.9 C) (02/11 0800) Temp Source: Oral (02/11 0800) BP: 164/71 mmHg (02/11 0900) Pulse Rate: 71 (02/11 0900)  Labs:  Recent Labs  04/09/14 1748 04/09/14 2226 04/10/14 0150 04/11/14 0645  HGB  --  8.3* 8.1* 7.9*  HCT  --  25.4* 24.8* 24.1*  PLT  --  182 186 149*  APTT  --  38*  --   --   LABPROT  --  19.3* 19.0* 18.2*  INR  --  1.61* 1.57* 1.50*  CREATININE  --  0.59 0.65 0.51  TROPONINI 0.03  --   --   --     Estimated Creatinine Clearance: 56.9 mL/min (by C-G formula based on Cr of 0.51).   Medications:  Scheduled:  . digoxin  125 mcg Oral Daily  . diltiazem  120 mg Oral Daily  . diphenhydrAMINE  25 mg Oral QHS  . insulin aspart  0-9 Units Subcutaneous TID WC  . levofloxacin (LEVAQUIN) IV  750 mg Intravenous Q24H  . levothyroxine  250 mcg Oral QAC breakfast  . mometasone-formoterol  2 puff Inhalation BID  . montelukast  10 mg Oral Daily  . potassium chloride SA  20 mEq Oral Daily  . promethazine  12.5 mg Intravenous Once  . sodium chloride  3 mL Intravenous Q12H  . Warfarin - Pharmacist Dosing Inpatient   Does not apply q1800   Infusions:    PRN: acetaminophen, albuterol, ondansetron **OR** ondansetron (ZOFRAN) IV, prochlorperazine  Assessment: 74 yo female with lymphoma on  chemotherapy admitted 2/9 with worsening nausea, vomiting, diarrhea, abd pain and sepsis. She is on chronic warfarin for afib. Her INR is subtherapeutic on admission (1.61). Pharmacy is consulted to dose warfarin while in hospital.  Home dose reported as 2.5 mg on Sun/Tues/Thurs/Sat, 5 mg on Mon/Wed/Fri   INR: continues to decrease to 1.5  CBC: Hgb decreased to 7.9, Plts decreased to 149k  Drug interaction: levaquin can inhibit warfarin metabolism and therefore decrease warfarin requirements  Goal of Therapy:  INR 2-3   Plan:   Increase warfarin carefully to 7.5mg  PO x 1 - give early at 10 AM today  Daily PT/INR  Defer decision to add Lovenox bridge to MD, but suspect INR to rise quickly given presence of multiple drug interactions  Peggyann Juba, PharmD, BCPS Pager: [phone] 04/11/2014,9:20 AM

## 2014-04-11 NOTE — Progress Notes (Signed)
[facility] Progress Note   Patty [name] HYQ:[id number] DOB: 19-Mar-1940 DOA: 04/09/2014 PCP: Horatio Pel, MD  Brief narrative: Patty [name] is a 74 y.o. female past medical history of lymphoma on chemotherapy, atrial fibrillation on coumadin, type 2 DM, hx/o septic shock, hx/o ecoli bacteremia who came into the hospital for chills and vomiting. She had about 4 loose stools the day before other symptoms started but then resolved on the day of admission. No fevers. No c/u dysuria but UA is grossly positive. H/o bladder tack.    Subjective: No further diarrhea, abdominal pain or vomiting. Wanting diet advanced.   Assessment/Plan: Principal Problem:   Sepsis--Bacteremia-- Pyelonephritis - blood cx and urine culture + for gr neg rods- repeat cultures negative- f/u for sensitivities- cont Levaquin- d/c Vanc and Azactam- d/c Flagyl today as no further diarrhea - had same presentation in early Dec with e coli bacteremia  Active Problems:   Colitis? Diarrhea  - noted to have colon thickening on CT- have ordered a C diff PCR but not having any diarrhea in the hospital  - had diarrhea at home that was thought to be related to side effect from chemo and Lomotil prescribed by Dr Alvy Bimler on 2/4 - advance diet and d/c Flagyl  Atrial fibrillation / Long-term (current) use of anticoagulants Continue digoxin and Coumadin per pharmacy.   Hypothyroidism Continue Synthroid.   Diabetes mellitus type 2, controlled, without complications Resume Metformin at d/c.   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 Alvy Bimler.   Code Status: no intubation, no Bipap, no defibrillation or ACLS meds- CPR only Family Communication:  Disposition Plan: home when stable DVT prophylaxis: Coumadin  Consultants: none  Procedures: none  Antibiotics: Anti-infectives    Start     Dose/Rate Route Frequency Ordered Stop   04/10/14 2000  levofloxacin  (LEVAQUIN) IVPB 750 mg     750 mg 100 mL/hr over 90 Minutes Intravenous Every 24 hours 04/09/14 1841     04/10/14 0800  vancomycin (VANCOCIN) 500 mg in sodium chloride 0.9 % 100 mL IVPB  Status:  Discontinued     500 mg 100 mL/hr over 60 Minutes Intravenous Every 12 hours 04/09/14 1841 04/10/14 0842   04/10/14 0600  metroNIDAZOLE (FLAGYL) IVPB 500 mg  Status:  Discontinued     500 mg 100 mL/hr over 60 Minutes Intravenous Every 8 hours 04/09/14 2228 04/11/14 0855   04/10/14 0300  aztreonam (AZACTAM) 2 g in dextrose 5 % 50 mL IVPB  Status:  Discontinued     2 g 100 mL/hr over 30 Minutes Intravenous Every 8 hours 04/09/14 1841 04/10/14 0841   04/09/14 2230  levofloxacin (LEVAQUIN) IVPB 750 mg  Status:  Discontinued     750 mg 100 mL/hr over 90 Minutes Intravenous  Once 04/09/14 2221 04/10/14 0629   04/09/14 2230  metroNIDAZOLE (FLAGYL) IVPB 500 mg     500 mg 100 mL/hr over 60 Minutes Intravenous  Once 04/09/14 2221 04/09/14 2338   04/09/14 1830  levofloxacin (LEVAQUIN) IVPB 750 mg     750 mg 100 mL/hr over 90 Minutes Intravenous  Once 04/09/14 1820 04/09/14 1920   04/09/14 1830  aztreonam (AZACTAM) 2 g in dextrose 5 % 50 mL IVPB     2 g 100 mL/hr over 30 Minutes Intravenous  Once 04/09/14 1820 04/09/14 1856   04/09/14 1830  vancomycin (VANCOCIN) IVPB 1000 mg/200 mL premix     1,000 mg 200 mL/hr over 60  Minutes Intravenous  Once 04/09/14 1820 04/09/14 2049         Objective: Filed Weights   04/09/14 2300  Weight: 65 kg (143 lb 4.8 oz)    Intake/Output Summary (Last 24 hours) at 04/11/14 1056 Last data filed at 04/11/14 0741  Gross per 24 hour  Intake 2616.67 ml  Output   1400 ml  Net 1216.67 ml     Vitals Filed Vitals:   04/11/14 0500 04/11/14 0700 04/11/14 0800 04/11/14 0900  BP: 155/67 140/79 152/73 164/71  Pulse: 92 85 78 71  Temp:   98.5 F (36.9 C)   TempSrc:   Oral   Resp: 19 23 22 24   Height:      Weight:      SpO2: 93% 94% 88% 96%    Exam: General:  AAO x3,  No acute respiratory distress Lungs: Clear to auscultation bilaterally without wheezes or crackles Cardiovascular: Regular rate and rhythm without murmur gallop or rub normal S1 and S2 Abdomen: tender in RLQ, nondistended, soft, bowel sounds positive, no rebound, no ascites, no appreciable mass Extremities: No significant cyanosis, clubbing, or edema bilateral lower extremities  Data Reviewed: Basic Metabolic Panel:  Recent Labs Lab 04/09/14 1328 04/09/14 1747 04/09/14 2226 04/10/14 0150 04/11/14 0645  NA 138 137 134* 135 137  K 4.3 3.7 3.7 4.0 3.3*  CL  --  103 105 105 108  CO2 24 23 21 21 23   GLUCOSE 144* 149* 144* 137* 135*  BUN 9.9 10 11 12 9   CREATININE 0.7 0.57 0.59 0.65 0.51  CALCIUM 9.2 8.7 7.6* 7.7* 8.1*   Liver Function Tests:  Recent Labs Lab 04/09/14 1328 04/09/14 1747 04/09/14 2226 04/10/14 0150 04/11/14 0645  AST 28 42* 49* 41* 23  ALT 24 26 26 25 22   ALKPHOS 100 94 70 68 64  BILITOT 0.43 0.8 1.0 1.1 0.7  PROT 5.7* 6.1 4.9* 5.0* 5.1*  ALBUMIN 3.3* 3.4* 2.6* 2.7* 2.7*    Recent Labs Lab 04/09/14 1747  LIPASE 12   No results for input(s): AMMONIA in the last 168 hours. CBC:  Recent Labs Lab 04/09/14 1326 04/09/14 1747 04/09/14 2226 04/10/14 0150 04/11/14 0645  WBC 13.9* 14.2* 19.2* 17.8* 12.9*  NEUTROABS 11.7* 13.1* 16.5* 15.4* 10.2*  HGB 9.6* 9.6* 8.3* 8.1* 7.9*  HCT 29.3* 28.9* 25.4* 24.8* 24.1*  MCV 92.7 93.5 93.7 93.6 93.4  PLT 232 225 182 186 149*   Cardiac Enzymes:  Recent Labs Lab 04/09/14 1748  TROPONINI 0.03   BNP (last 3 results) No results for input(s): BNP in the last 8760 hours.  ProBNP (last 3 results)  Recent Labs  01/20/14 1035  PROBNP 36152.0*    CBG:  Recent Labs Lab 04/10/14 1657 04/10/14 1956 04/10/14 2343 04/11/14 0416 04/11/14 0811  GLUCAP 109* 213* 133* 114* 125*    Recent Results (from the past 240 hour(s))  Urine culture     Status: None   Collection Time: 04/09/14  2:05 PM   Result Value Ref Range Status   Urine Culture, Routine Culture, Urine  Final    Comment: Final - ===== COLONY COUNT: ===== >=100,000 COLONIES/ML Multiple bacterial morphotypes present, none predominant. Suggest appropriate recollection if  clinically indicated.   Culture, blood (routine x 2)     Status: None (Preliminary result)   Collection Time: 04/09/14  5:48 PM  Result Value Ref Range Status   Specimen Description BLOOD RIGHT PORTA CATH  Final   Special Requests BOTTLES DRAWN AEROBIC AND ANAEROBIC  [facility] EACH  Final   Culture   Final    ESCHERICHIA COLI Note: Gram Stain Report Called to,Read Back By and Verified With: KIM CHEEK 04/10/14 0835 BY SMITHERSJ Performed at [facility]    Report Status PENDING  Incomplete  Culture, blood (routine x 2)     Status: None (Preliminary result)   Collection Time: 04/09/14  5:48 PM  Result Value Ref Range Status   Specimen Description BLOOD RIGHT WRIST  Final   Special Requests BOTTLES DRAWN AEROBIC AND ANAEROBIC 5CC EACH  Final   Culture   Final    GRAM NEGATIVE RODS Note: Gram Stain Report Called to,Read Back By and Verified With: KIM CHEEK 04/10/14 0835 BY SMITHERSJ Performed at [facility]    Report Status PENDING  Incomplete  Urine culture     Status: None   Collection Time: 04/09/14  7:32 PM  Result Value Ref Range Status   Specimen Description URINE, CLEAN CATCH  Final   Special Requests NONE  Final   Colony Count   Final    >=100,000 COLONIES/ML Performed at [facility]    Culture   Final    Multiple bacterial morphotypes present, none predominant. Suggest appropriate recollection if clinically indicated. Performed at [facility]    Report Status 04/10/2014 FINAL  Final  MRSA PCR Screening     Status: None   Collection Time: 04/10/14  2:16 AM  Result Value Ref Range Status   MRSA by PCR NEGATIVE NEGATIVE Final    Comment:        The GeneXpert MRSA Assay (FDA approved for NASAL  specimens only), is one component of a comprehensive MRSA colonization surveillance program. It is not intended to diagnose MRSA infection nor to guide or monitor treatment for MRSA infections.      Studies:  Recent x-ray studies have been reviewed in detail by the Attending Physician  Scheduled Meds:  Scheduled Meds: . digoxin  125 mcg Oral Daily  . diltiazem  120 mg Oral Daily  . diphenhydrAMINE  25 mg Oral QHS  . insulin aspart  0-9 Units Subcutaneous TID WC  . levofloxacin (LEVAQUIN) IV  750 mg Intravenous Q24H  . levothyroxine  250 mcg Oral QAC breakfast  . mometasone-formoterol  2 puff Inhalation BID  . montelukast  10 mg Oral Daily  . potassium chloride SA  20 mEq Oral Daily  . promethazine  12.5 mg Intravenous Once  . sodium chloride  3 mL Intravenous Q12H  . warfarin  7.5 mg Oral Once  . Warfarin - Pharmacist Dosing Inpatient   Does not apply q1800   Continuous Infusions:    Time spent on care of this patient: 35 min   [place], MD 04/11/2014, 10:56 AM  LOS: 2 days   [facility] Office  [phone] Pager - Text Page per [email or web address]  If 7PM-7AM, please contact night-coverage [email or web address]

## 2014-04-12 DIAGNOSIS — A4151 Sepsis due to Escherichia coli [E. coli]: Principal | ICD-10-CM

## 2014-04-12 LAB — CULTURE, BLOOD (ROUTINE X 2)

## 2014-04-12 LAB — COMPREHENSIVE METABOLIC PANEL
ALBUMIN: 3 g/dL — AB (ref 3.5–5.2)
ALT: 20 U/L (ref 0–35)
ANION GAP: 7 (ref 5–15)
AST: 16 U/L (ref 0–37)
Alkaline Phosphatase: 67 U/L (ref 39–117)
BUN: 7 mg/dL (ref 6–23)
CHLORIDE: 102 mmol/L (ref 96–112)
CO2: 24 mmol/L (ref 19–32)
Calcium: 8.4 mg/dL (ref 8.4–10.5)
Creatinine, Ser: 0.51 mg/dL (ref 0.50–1.10)
GFR calc Af Amer: >90 mL/min (ref >90)
GFR calc non Af Amer: >90 mL/min (ref >90)
Glucose, Bld: 141 mg/dL — ABNORMAL HIGH (ref 70–99)
Potassium: 3 mmol/L — ABNORMAL LOW (ref 3.5–5.1)
Sodium: 133 mmol/L — ABNORMAL LOW (ref 135–145)
Total Bilirubin: 0.6 mg/dL (ref 0.3–1.2)
Total Protein: 5.7 g/dL — ABNORMAL LOW (ref 6.0–8.3)

## 2014-04-12 LAB — CBC WITH DIFFERENTIAL/PLATELET
BASOS ABS: 0 10*3/uL (ref 0.0–0.1)
BASOS PCT: 0 % (ref 0–1)
EOS ABS: 0.1 10*3/uL (ref 0.0–0.7)
Eosinophils Relative: 1 % (ref 0–5)
HCT: 25.7 % — ABNORMAL LOW (ref 36.0–46.0)
Hemoglobin: 8.4 g/dL — ABNORMAL LOW (ref 12.0–15.0)
Lymphocytes Relative: 8 % — ABNORMAL LOW (ref 12–46)
Lymphs Abs: 1 10*3/uL (ref 0.7–4.0)
MCH: 30.2 pg (ref 26.0–34.0)
MCHC: 32.7 g/dL (ref 30.0–36.0)
MCV: 92.4 fL (ref 78.0–100.0)
MONO ABS: 1.3 10*3/uL — AB (ref 0.1–1.0)
Monocytes Relative: 11 % (ref 3–12)
NEUTROS ABS: 9.8 10*3/uL — AB (ref 1.7–7.7)
Neutrophils Relative %: 80 % — ABNORMAL HIGH (ref 43–77)
PLATELETS: 201 10*3/uL (ref 150–400)
RBC: 2.78 MIL/uL — ABNORMAL LOW (ref 3.87–5.11)
RDW: 16.6 % — AB (ref 11.5–15.5)
WBC: 12.2 10*3/uL — ABNORMAL HIGH (ref 4.0–10.5)

## 2014-04-12 LAB — PROTIME-INR
INR: 1.61 — AB (ref 0.00–1.49)
Prothrombin Time: 19.2 seconds — ABNORMAL HIGH (ref 11.6–15.2)

## 2014-04-12 LAB — GLUCOSE, CAPILLARY: GLUCOSE-CAPILLARY: 137 mg/dL — AB (ref 70–99)

## 2014-04-12 MED ORDER — LEVOFLOXACIN 750 MG PO TABS: 750.0000 mg | ORAL_TABLET | Freq: Every day | ORAL | Status: DC

## 2014-04-12 MED ORDER — WARFARIN SODIUM 5 MG PO TABS
5.0000 mg | ORAL_TABLET | Freq: Once | ORAL | Status: DC
  Filled 2014-04-12: qty 1

## 2014-04-12 MED ORDER — HEPARIN SOD (PORK) LOCK FLUSH 100 UNIT/ML IV SOLN
500.0000 [IU] | INTRAVENOUS | Status: DC | PRN
Start: 2014-04-12 — End: 2014-04-12
  Filled 2014-04-12: qty 5

## 2014-04-12 MED ORDER — LEVOFLOXACIN 750 MG PO TABS
750.0000 mg | ORAL_TABLET | Freq: Every day | ORAL | Status: DC
  Administered 2014-04-12: 750 mg via ORAL
  Filled 2014-04-12: qty 1

## 2014-04-12 MED ORDER — CEPHALEXIN 500 MG PO CAPS: 500.0000 mg | ORAL_CAPSULE | Freq: Two times a day (BID) | ORAL | Status: DC

## 2014-04-12 MED ORDER — POTASSIUM CHLORIDE CRYS ER 20 MEQ PO TBCR
40.0000 meq | EXTENDED_RELEASE_TABLET | Freq: Every day | ORAL | Status: DC
  Administered 2014-04-12: 40 meq via ORAL

## 2014-04-12 NOTE — Progress Notes (Signed)
Pt discharged home with spouse in stable condition. Discharge instructions given.Script sent to pharmacy of choice. Pt verbalized understanding.

## 2014-04-12 NOTE — Care Management Note (Signed)
Medicare Important Message given?  YES (If response is "NO", the following Medicare IM given date fields will be blank) Date Medicare IM given:  04/12/2014 Medicare IM given by:  Marney Doctor

## 2014-04-12 NOTE — Progress Notes (Signed)
ANTICOAGULATION CONSULT NOTE - Follow Up Consult  Pharmacy Consult for Warfarin Indication: atrial fibrillation  Allergies  Allergen Reactions  . Morphine And Related Anaphylaxis and Other (See Comments)    Pt states she stopped breathing post op- "went into resp. arrest"  . Demerol Nausea And Vomiting  . Meperidine Hcl Nausea And Vomiting  . Multaq [Dronedarone] Other (See Comments)    Reaction:  Blood in urine and elevated liver enzymes.   Jeanie Cooks Allergy] Rash  . Penicillins Itching and Rash  . Sulfa Drugs Cross Reactors Rash    Patient Measurements: Height: 5\' 6"  (167.6 cm) Weight: 133 lb (60.328 kg) IBW/kg (Calculated) : 59.3  Vital Signs: Temp: 98.5 F (36.9 C) (02/12 0445) Temp Source: Oral (02/12 0445) BP: 165/64 mmHg (02/12 0500) Pulse Rate: 96 (02/12 0445)  Labs:  Recent Labs  04/09/14 1748 04/09/14 2226 04/10/14 0150 04/11/14 0645 04/12/14 0529  HGB  --  8.3* 8.1* 7.9* 8.4*  HCT  --  25.4* 24.8* 24.1* 25.7*  PLT  --  182 186 149* 201  APTT  --  38*  --   --   --   LABPROT  --  19.3* 19.0* 18.2* 19.2*  INR  --  1.61* 1.57* 1.50* 1.61*  CREATININE  --  0.59 0.65 0.51 0.51  TROPONINI 0.03  --   --   --   --     Estimated Creatinine Clearance: 56.9 mL/min (by C-G formula based on Cr of 0.51).   Medications:  Scheduled:  . digoxin  125 mcg Oral Daily  . diltiazem  120 mg Oral Daily  . diphenhydrAMINE  25 mg Oral QHS  . insulin aspart  0-9 Units Subcutaneous TID WC  . levofloxacin  750 mg Oral Daily  . levothyroxine  250 mcg Oral QAC breakfast  . mometasone-formoterol  2 puff Inhalation BID  . montelukast  10 mg Oral Daily  . potassium chloride SA  40 mEq Oral Daily  . promethazine  12.5 mg Intravenous Once  . sodium chloride  3 mL Intravenous Q12H  . Warfarin - Pharmacist Dosing Inpatient   Does not apply q1800   Infusions:    PRN: acetaminophen, albuterol, ondansetron **OR** ondansetron (ZOFRAN) IV,  prochlorperazine  Assessment: 74 yo female with lymphoma on chemotherapy admitted 2/9 with worsening nausea, vomiting, diarrhea, abd pain and sepsis. She is on chronic warfarin for afib. Her INR is subtherapeutic on admission (1.61). Pharmacy is consulted to dose warfarin while in hospital.  Home dose reported as 2.5 mg on Sun/Tues/Thurs/Sat, 5 mg on Mon/Wed/Fri   INR subtherapeutic but rising now  CBC: Hgb low but improved, Plts improved  Drug interaction: levaquin can inhibit warfarin metabolism and therefore decrease warfarin requirements  Goal of Therapy:  INR 2-3   Plan:  Plan for patient to be discharged today - would recommend 5mg  today as per home dose per above and continue home regimen for now with close INR follow up since patient will be on Levaquin for 12 more days   Adrian Saran, PharmD, BCPS Pager [phone] 04/12/2014 9:41 AM

## 2014-04-12 NOTE — Discharge Summary (Addendum)
Physician Discharge Summary  Patty [name] QMV:[id number] DOB: [birth year] DOA: 04/09/2014  PCP: Horatio Pel, MD  Admit date: 04/09/2014 Discharge date: 04/12/2014  Time spent: 55 minutes  Recommendations for Outpatient Follow-up:  1. INR in 3 days as Coumadin will interact with Levaquin- current INR 1.61  Discharge Condition: stable Diet recommendation: heart healthy  Discharge Diagnoses:  Principal Problem:   Sepsis due to E coli Active Problems:   Pyelonephritis   Bacteremia   History of present illness:  Patty [name] is a 74 y.o. female past medical history of lymphoma on chemotherapy, atrial fibrillation on coumadin, type 2 DM, hx/o septic shock, hx/o ecoli bacteremia who came into the hospital for chills and vomiting. She had about 4 loose stools the day before other symptoms started but then resolved on the day of admission. No fevers. No c/u dysuria but UA is grossly positive. H/o bladder tack.   Hospital Course:  Principal Problem:  Sepsis-- e coli Bacteremia-- Pyelonephritis - blood cx and urine culture + for gr neg rods sensitive to Cipro- repeat cultures negative-- cont Levaquin for a total of 14 days - had same presentation in early Dec with e coli bacteremia  Active Problems:  Colitis? Diarrhea  - noted to have colon thickening on CT- have ordered a C diff PCR but not having any diarrhea in the hospital  - had diarrhea at home that was thought to be related to side effect from chemo and Lomotil prescribed by Dr Alvy Bimler on 2/4 - advanced diet and d/c'd Flagyl  Atrial fibrillation / Long-term (current) use of anticoagulants Continue digoxin and Coumadin   Hypokalemia - chronic issue- will give 40 meq of K+ today rather than her usual dose of 20 meq   Hypothyroidism Continue Synthroid.   Diabetes mellitus type 2, controlled, without complications Resume Metformin at d/c.   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 Alvy Bimler.   Discharge Exam: Filed Weights   04/09/14 2300 04/11/14 1112  Weight: 65 kg (143 lb 4.8 oz) 60.328 kg (133 lb)   Filed Vitals:   04/12/14 0500  BP: 165/64  Pulse:   Temp:   Resp:     General: AAO x 3, no distress Cardiovascular: RRR, no murmurs  Respiratory: clear to auscultation bilaterally GI: soft, non-tender, non-distended, bowel sound positive  Discharge Instructions You were cared for by a hospitalist during your hospital stay. If you have any questions about your discharge medications or the care you received while you were in the hospital after you are discharged, you can call the unit and asked to speak with the hospitalist on call if the hospitalist that took care of you is not available. Once you are discharged, your primary care physician will handle any further medical issues. Please note that NO REFILLS for any discharge medications will be authorized once you are discharged, as it is imperative that you return to your primary care physician (or establish a relationship with a primary care physician if you do not have one) for your aftercare needs so that they can reassess your need for medications and monitor your lab values.  Discharge Instructions    Diet - low sodium heart healthy    Complete by:  As directed      Increase activity slowly    Complete by:  As directed             Medication List    TAKE these medications  acetaminophen 500 MG tablet  Commonly known as:  TYLENOL  Take 1,000 mg by mouth every 6 (six) hours as needed for mild pain (pain).     conjugated estrogens vaginal cream  Commonly known as:  PREMARIN  Place 1 Applicatorful vaginally 3 (three) times a week. Monday, Wednesday, and Friday nights     digoxin 0.125 MG tablet  Commonly known as:  LANOXIN  Take 125 mcg by mouth daily.     diltiazem 120 MG 24 hr capsule  Commonly known as:  CARDIZEM CD  Take 120 mg by mouth daily.      diphenhydramine-acetaminophen 25-500 MG Tabs  Commonly known as:  TYLENOL PM  Take 1 tablet by mouth at bedtime as needed (sleep).     diphenoxylate-atropine 2.5-0.025 MG per tablet  Commonly known as:  LOMOTIL  Take 1 tablet by mouth 4 (four) times daily as needed for diarrhea or loose stools.     ezetimibe 10 MG tablet  Commonly known as:  ZETIA  Take 10 mg by mouth at bedtime.     Fluticasone-Salmeterol 250-50 MCG/DOSE Aepb  Commonly known as:  ADVAIR  Inhale 1 puff into the lungs every 12 (twelve) hours.     levofloxacin 750 MG tablet  Commonly known as:  LEVAQUIN  Take 1 tablet (750 mg total) by mouth daily.     levothyroxine 125 MCG tablet  Commonly known as:  SYNTHROID, LEVOTHROID  Take 250 mcg by mouth daily before breakfast.     lidocaine-prilocaine cream  Commonly known as:  EMLA  Apply 1 application topically as needed. Apply to [facility] a Cath site at least one hour prior to needle stick as needed.     Magnesium Oxide 500 MG Tabs  Take 500 mg by mouth at bedtime.     metFORMIN 500 MG 24 hr tablet  Commonly known as:  GLUCOPHAGE-XR  Take 500 mg by mouth 2 (two) times daily with a meal.     montelukast 10 MG tablet  Commonly known as:  SINGULAIR  Take 10 mg by mouth daily.     omega-3 acid ethyl esters 1 G capsule  Commonly known as:  LOVAZA  Take 1 g by mouth 2 (two) times daily.     ondansetron 8 MG tablet  Commonly known as:  ZOFRAN  Take 8 mg by mouth every 8 (eight) hours as needed for nausea or vomiting (nausea).     potassium chloride SA 20 MEQ tablet  Commonly known as:  K-DUR,KLOR-CON  Take 1 tablet (20 mEq total) by mouth daily.     pravastatin 40 MG tablet  Commonly known as:  PRAVACHOL  Take 20 mg by mouth See admin instructions. Take 1/2 tablet (20 mg) ONLY on Monday, Tuesday, Wednesday, and Thursday Nights.     PROAIR HFA 108 (90 BASE) MCG/ACT inhaler  Generic drug:  albuterol  Inhale 2 puffs into the lungs every 6 (six) hours as needed for  wheezing or shortness of breath.     prochlorperazine 10 MG tablet  Commonly known as:  COMPAZINE  Take 10 mg by mouth every 8 (eight) hours as needed for nausea (nausea).     traMADol-acetaminophen 37.5-325 MG per tablet  Commonly known as:  ULTRACET  Take 1 tablet by mouth every 6 (six) hours as needed.     Vitamin D 2000 UNITS tablet  Take 2,000 Units by mouth daily with lunch.     warfarin 5 MG tablet  Commonly known as:  COUMADIN  Take 2.5-5 mg by mouth daily. Take 1/2 tablet (2.5 mg) on (Sun, Tues, Thurs, Sat) and Take 1 tablet (5 mg) on (Mon, Wed, Fri)       Allergies  Allergen Reactions  . Morphine And Related Anaphylaxis and Other (See Comments)    Pt states she stopped breathing post op- "went into resp. arrest"  . Demerol Nausea And Vomiting  . Meperidine Hcl Nausea And Vomiting  . Multaq [Dronedarone] Other (See Comments)    Reaction:  Blood in urine and elevated liver enzymes.   Jeanie Cooks Allergy] Rash  . Penicillins Itching and Rash  . Sulfa Drugs Cross Reactors Rash       Follow-up Information    Follow up with Horatio Pel, MD.   Specialty:  Internal Medicine   Why:  get INR checked on Monday   Contact information:   [address] [place] [address] [phone]        The results of significant diagnostics from this hospitalization (including imaging, microbiology, ancillary and laboratory) are listed below for reference.    Significant Diagnostic Studies: Ct Abdomen Pelvis W Contrast  04/09/2014   CLINICAL DATA:  Malignant lymphoma. Last chemotherapy 04/03/2014. Now complains of chills, weakness, nausea, vomiting, diarrhea. History of appendectomy, hysterectomy, cholecystectomy.  EXAM: CT ABDOMEN AND PELVIS WITH CONTRAST  TECHNIQUE: Multidetector CT imaging of the abdomen and pelvis was performed using the standard protocol following bolus administration of intravenous contrast.  CONTRAST:  159mL OMNIPAQUE  IOHEXOL 300 MG/ML  SOLN  COMPARISON:  PET-CT 02/26/2014.  CT abdomen and pelvis 07/01/2010.  FINDINGS: Atelectasis or infiltration in both lung bases. Mild cardiac enlargement.  Diffuse fatty infiltration of the liver. Surgical absence of the gallbladder. No bile duct dilatation. Pancreas is atrophic. Multiple tiny poorly defined low-attenuation lesions in the spleen. This could be related to lymphoma or infections. Soft tissue filling with poor definition of fat around the celiac axis. No discrete lymph node is demonstrated. This is likely related to lymphoma. Small amount of free fluid/ edema in the upper abdomen and mesenteric. This is been present previously and is likely reactive or inflammatory. Scattered mesenteric and retroperitoneal lymph nodes are not pathologically enlarged. Calcification of the aorta without aneurysm. The inferior vena cava is unremarkable. Parapelvic cysts on the left kidney. No hydronephrosis in either kidney. Nephrograms are somewhat heterogeneous bilaterally with evidence of thickening of the wall of the right ureter. Changes are nonspecific but could indicate pyelonephritis. Clinical correlation suggested. Stomach is decompressed. Small bowel are not abnormally distended. Colon is decompressed but there is suggestion of colonic wall thickening involving the ascending colon and transverse region. Colitis is not excluded. No free air in the abdomen.  Pelvis: Bladder is decompressed. Gas within the bladder is probably due to instrumentation. Fistula or infection could also have this appearance. Uterus is surgically absent. No loculated pelvic fluid collections. Diverticula in the sigmoid colon without evidence of diverticulitis. Degenerative changes in the lumbar spine. No destructive bone lesions appreciated.  IMPRESSION: Diffuse fatty infiltration of the liver. Multiple tiny poorly defined low-attenuation lesions in the spleen. This could be related to lymphoma or infection. Soft  tissue around the celiac axis is probably related to lymphoma. Abdominal and retroperitoneal lymph nodes are not pathologically enlarged. Heterogeneous renal nephrograms suggest possible pyelonephritis. Gas in the bladder may be due to infection. Pericolonic wall thickening suggest colitis. Small amount of free fluid in the abdomen.   Electronically Signed   By: Oren Beckmann.D.  On: 04/09/2014 20:30   Dg Chest Port 1 View  04/09/2014   CLINICAL DATA:  Emesis and diarrhea.  EXAM: PORTABLE CHEST - 1 VIEW  COMPARISON:  01/26/2014  FINDINGS: Mild cardiomegaly is stable from prior. Aortic hilar contours are unremarkable. Right IJ porta catheter is in stable position, tip at the upper cavoatrial junction. There is interstitial coarsening without edema, consolidation, effusion, or pneumothorax.  IMPRESSION: No active disease.   Electronically Signed   By: Monte Fantasia M.D.   On: 04/09/2014 18:49   Mm Screening Breast Tomo Bilateral  03/28/2014   CLINICAL DATA:  Screening.  EXAM: DIGITAL SCREENING BILATERAL MAMMOGRAM WITH 3D TOMO WITH CAD  COMPARISON:  Previous exam(s).  ACR Breast Density Category c: The breast tissue is heterogeneously dense, which may obscure small masses.  FINDINGS: There are no findings suspicious for malignancy. Images were processed with CAD.  IMPRESSION: No mammographic evidence of malignancy. A result letter of this screening mammogram will be mailed directly to the patient.  RECOMMENDATION: Screening mammogram in one year. (Code:SM-B-01Y)  BI-RADS CATEGORY  1: Negative.   Electronically Signed   By: Conchita Paris M.D.   On: 03/28/2014 13:34    Microbiology: Recent Results (from the past 240 hour(s))  Urine culture     Status: None   Collection Time: 04/09/14  2:05 PM  Result Value Ref Range Status   Urine Culture, Routine Culture, Urine  Final    Comment: Final - ===== COLONY COUNT: ===== >=100,000 COLONIES/ML Multiple bacterial morphotypes present, none predominant.  Suggest appropriate recollection if  clinically indicated.   Culture, blood (routine x 2)     Status: None   Collection Time: 04/09/14  5:48 PM  Result Value Ref Range Status   Specimen Description BLOOD RIGHT PORTA CATH  Final   Special Requests BOTTLES DRAWN AEROBIC AND ANAEROBIC 5CC EACH  Final   Culture   Final    ESCHERICHIA COLI Note: Gram Stain Report Called to,Read Back By and Verified With: KIM CHEEK 04/10/14 0835 BY SMITHERSJ Performed at [facility]    Report Status 04/12/2014 FINAL  Final   Organism ID, Bacteria ESCHERICHIA COLI  Final      Susceptibility   Escherichia coli - MIC*    AMPICILLIN <=2 SENSITIVE Sensitive     AMPICILLIN/SULBACTAM <=2 SENSITIVE Sensitive     CEFAZOLIN <=4 SENSITIVE Sensitive     CEFEPIME <=1 SENSITIVE Sensitive     CEFTAZIDIME <=1 SENSITIVE Sensitive     CEFTRIAXONE <=1 SENSITIVE Sensitive     CIPROFLOXACIN 0.5 SENSITIVE Sensitive     GENTAMICIN >=16 RESISTANT Resistant     IMIPENEM <=0.25 SENSITIVE Sensitive     PIP/TAZO <=4 SENSITIVE Sensitive     TOBRAMYCIN 8 INTERMEDIATE Intermediate     TRIMETH/SULFA <=20 SENSITIVE Sensitive     * ESCHERICHIA COLI  Culture, blood (routine x 2)     Status: None   Collection Time: 04/09/14  5:48 PM  Result Value Ref Range Status   Specimen Description BLOOD RIGHT WRIST  Final   Special Requests BOTTLES DRAWN AEROBIC AND ANAEROBIC 5CC EACH  Final   Culture   Final    ESCHERICHIA COLI Note: SUSCEPTIBILITIES PERFORMED ON PREVIOUS CULTURE WITHIN THE LAST 5 DAYS. Note: Gram Stain Report Called to,Read Back By and Verified With: KIM CHEEK 04/10/14 0835 BY SMITHERSJ Performed at [facility]    Report Status 04/12/2014 FINAL  Final  Urine culture     Status: None   Collection Time:  04/09/14  7:32 PM  Result Value Ref Range Status   Specimen Description URINE, CLEAN CATCH  Final   Special Requests NONE  Final   Colony Count   Final    >=100,000 COLONIES/ML Performed at [facility]    Culture   Final    Multiple bacterial morphotypes present, none predominant. Suggest appropriate recollection if clinically indicated. Performed at [facility]    Report Status 04/10/2014 FINAL  Final  MRSA PCR Screening     Status: None   Collection Time: 04/10/14  2:16 AM  Result Value Ref Range Status   MRSA by PCR NEGATIVE NEGATIVE Final    Comment:        The GeneXpert MRSA Assay (FDA approved for NASAL specimens only), is one component of a comprehensive MRSA colonization surveillance program. It is not intended to diagnose MRSA infection nor to guide or monitor treatment for MRSA infections.   Culture, blood (routine x 2)     Status: None (Preliminary result)   Collection Time: 04/10/14  6:13 PM  Result Value Ref Range Status   Specimen Description BLOOD RAC  Final   Special Requests BOTTLES DRAWN AEROBIC AND ANAEROBIC 10ML  Final   Culture   Final           BLOOD CULTURE RECEIVED NO GROWTH TO DATE CULTURE WILL BE HELD FOR 5 DAYS BEFORE ISSUING A FINAL NEGATIVE REPORT Performed at [facility]    Report Status PENDING  Incomplete  Culture, blood (routine x 2)     Status: None (Preliminary result)   Collection Time: 04/10/14  6:27 PM  Result Value Ref Range Status   Specimen Description BLOOD RIGHT ANTECUBITAL  Final   Special Requests BOTTLES DRAWN AEROBIC AND ANAEROBIC 5ML  Final   Culture   Final           BLOOD CULTURE RECEIVED NO GROWTH TO DATE CULTURE WILL BE HELD FOR 5 DAYS BEFORE ISSUING A FINAL NEGATIVE REPORT Performed at [facility]    Report Status PENDING  Incomplete     Labs: Basic Metabolic Panel:  Recent Labs Lab 04/09/14 1747 04/09/14 2226 04/10/14 0150 04/11/14 0645 04/12/14 0529  NA 137 134* 135 137 133*  K 3.7 3.7 4.0 3.3* 3.0*  CL 103 105 105 108 102  CO2 23 21 21 23 24   GLUCOSE 149* 144* 137* 135* 141*  BUN 10 11 12 9 7   CREATININE 0.57 0.59 0.65 0.51 0.51  CALCIUM 8.7 7.6* 7.7* 8.1* 8.4    Liver Function Tests:  Recent Labs Lab 04/09/14 1747 04/09/14 2226 04/10/14 0150 04/11/14 0645 04/12/14 0529  AST 42* 49* 41* 23 16  ALT 26 26 25 22 20   ALKPHOS 94 70 68 64 67  BILITOT 0.8 1.0 1.1 0.7 0.6  PROT 6.1 4.9* 5.0* 5.1* 5.7*  ALBUMIN 3.4* 2.6* 2.7* 2.7* 3.0*    Recent Labs Lab 04/09/14 1747  LIPASE 12   No results for input(s): AMMONIA in the last 168 hours. CBC:  Recent Labs Lab 04/09/14 1747 04/09/14 2226 04/10/14 0150 04/11/14 0645 04/12/14 0529  WBC 14.2* 19.2* 17.8* 12.9* 12.2*  NEUTROABS 13.1* 16.5* 15.4* 10.2* 9.8*  HGB 9.6* 8.3* 8.1* 7.9* 8.4*  HCT 28.9* 25.4* 24.8* 24.1* 25.7*  MCV 93.5 93.7 93.6 93.4 92.4  PLT 225 182 186 149* 201   Cardiac Enzymes:  Recent Labs Lab 04/09/14 1748  TROPONINI 0.03   BNP: BNP (last 3 results) No results for input(s):  BNP in the last 8760 hours.  ProBNP (last 3 results)  Recent Labs  01/20/14 1035  PROBNP 36152.0*    CBG:  Recent Labs Lab 04/11/14 0811 04/11/14 1159 04/11/14 1812 04/11/14 2139 04/12/14 0740  GLUCAP 125* 118* 156* 137* 137*       SignedDebbe Odea, MD [facility] 04/12/2014, 10:02 AM

## 2014-04-15 ENCOUNTER — Telehealth: Payer: Self-pay | Admitting: *Deleted

## 2014-04-15 NOTE — Telephone Encounter (Signed)
-----   Message from Heath Lark, MD sent at 04/15/2014 12:06 PM EST ----- Regarding: recent admission Can you call to see if she is OK? I see her next week but I'm worried if she feels OK whether she needs more IVF

## 2014-04-15 NOTE — Telephone Encounter (Signed)
Pt states she is feeling better,  Still weak, but overall better.  Denies any fevers,  Has some nausea controlled w/ anti nausea med.  No vomiting.  Had one loose stool last night, but none today.  States drinking a lot of fluid and is urinating well.  Encouraged pt to drink plenty of fluids, stay hydrated.  Instructed to call us if any fevers, chills, vomiting or diarrhea.  Confirmed her next appt on 2/25.   Pt verbalized understanding and will call us w/ any concerns or changes before her next appt.Marland Kitchen

## 2014-04-16 DIAGNOSIS — Z7901 Long term (current) use of anticoagulants: Secondary | ICD-10-CM | POA: Diagnosis not present

## 2014-04-17 LAB — CULTURE, BLOOD (ROUTINE X 2)
CULTURE: NO GROWTH
Culture: NO GROWTH

## 2014-04-23 DIAGNOSIS — Z7901 Long term (current) use of anticoagulants: Secondary | ICD-10-CM | POA: Diagnosis not present

## 2014-04-24 ENCOUNTER — Other Ambulatory Visit: Payer: Self-pay | Admitting: Hematology and Oncology

## 2014-04-24 DIAGNOSIS — C8591 Non-Hodgkin lymphoma, unspecified, lymph nodes of head, face, and neck: Secondary | ICD-10-CM

## 2014-04-24 DIAGNOSIS — D630 Anemia in neoplastic disease: Secondary | ICD-10-CM

## 2014-04-25 ENCOUNTER — Other Ambulatory Visit (HOSPITAL_BASED_OUTPATIENT_CLINIC_OR_DEPARTMENT_OTHER): Payer: Medicare Other

## 2014-04-25 ENCOUNTER — Ambulatory Visit (HOSPITAL_BASED_OUTPATIENT_CLINIC_OR_DEPARTMENT_OTHER): Payer: Medicare Other | Admitting: Hematology and Oncology

## 2014-04-25 ENCOUNTER — Telehealth: Payer: Self-pay | Admitting: Hematology and Oncology

## 2014-04-25 VITALS — BP 112/58 | HR 78 | Temp 97.5°F | Resp 18 | Ht 66.0 in | Wt 135.9 lb

## 2014-04-25 DIAGNOSIS — C8591 Non-Hodgkin lymphoma, unspecified, lymph nodes of head, face, and neck: Secondary | ICD-10-CM | POA: Diagnosis not present

## 2014-04-25 DIAGNOSIS — I482 Chronic atrial fibrillation, unspecified: Secondary | ICD-10-CM

## 2014-04-25 DIAGNOSIS — D630 Anemia in neoplastic disease: Secondary | ICD-10-CM

## 2014-04-25 LAB — CBC WITH DIFFERENTIAL/PLATELET
BASO%: 0.3 % (ref 0.0–2.0)
Basophils Absolute: 0 10*3/uL (ref 0.0–0.1)
EOS%: 1.3 % (ref 0.0–7.0)
Eosinophils Absolute: 0.1 10*3/uL (ref 0.0–0.5)
HEMATOCRIT: 31.3 % — AB (ref 34.8–46.6)
HEMOGLOBIN: 10 g/dL — AB (ref 11.6–15.9)
LYMPH%: 13.1 % — ABNORMAL LOW (ref 14.0–49.7)
MCH: 29.8 pg (ref 25.1–34.0)
MCHC: 31.9 g/dL (ref 31.5–36.0)
MCV: 93.2 fL (ref 79.5–101.0)
MONO#: 0.8 10*3/uL (ref 0.1–0.9)
MONO%: 8.7 % (ref 0.0–14.0)
NEUT%: 76.6 % (ref 38.4–76.8)
NEUTROS ABS: 6.7 10*3/uL — AB (ref 1.5–6.5)
Platelets: 284 10*3/uL (ref 145–400)
RBC: 3.36 10*6/uL — AB (ref 3.70–5.45)
RDW: 16.4 % — ABNORMAL HIGH (ref 11.2–14.5)
WBC: 8.7 10*3/uL (ref 3.9–10.3)
lymph#: 1.1 10*3/uL (ref 0.9–3.3)

## 2014-04-25 LAB — COMPREHENSIVE METABOLIC PANEL (CC13)
ALBUMIN: 3.6 g/dL (ref 3.5–5.0)
ALT: 17 U/L (ref 0–55)
AST: 25 U/L (ref 5–34)
Alkaline Phosphatase: 100 U/L (ref 40–150)
Anion Gap: 11 mEq/L (ref 3–11)
BILIRUBIN TOTAL: 0.32 mg/dL (ref 0.20–1.20)
BUN: 13.9 mg/dL (ref 7.0–26.0)
CO2: 25 mEq/L (ref 22–29)
Calcium: 9.8 mg/dL (ref 8.4–10.4)
Chloride: 105 mEq/L (ref 98–109)
Creatinine: 0.8 mg/dL (ref 0.6–1.1)
EGFR: 72 mL/min/{1.73_m2} — AB (ref >90)
GLUCOSE: 143 mg/dL — AB (ref 70–140)
POTASSIUM: 4.6 meq/L (ref 3.5–5.1)
SODIUM: 141 meq/L (ref 136–145)
TOTAL PROTEIN: 6.1 g/dL — AB (ref 6.4–8.3)

## 2014-04-25 LAB — HOLD TUBE, BLOOD BANK

## 2014-04-25 LAB — LACTATE DEHYDROGENASE (CC13): LDH: 241 U/L (ref 125–245)

## 2014-04-25 NOTE — Assessment & Plan Note (Signed)
Today, she is rate controlled. She will continue her cardiac medications.

## 2014-04-25 NOTE — Progress Notes (Signed)
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   12/07/2013 Pathology Results Accession: [id number] biopsies show high-grade follicular lymphoma.   01/03/2014 Bone Marrow Biopsy Accession: [id number] Bone marrow biopsy is negative   01/04/2014 Imaging ECHO showed normal EF   01/04/2014 Procedure She has placement of port   01/09/2014 - 03/07/2014 Chemotherapy She was given treatment with bendamustine with rituximab. Treatment was stopped due to severe side-effects despite significant dose adjustment for cycle 2   01/18/2014 - 02/01/2014 Hospital Admission She was admitted to the hospital from Escherichia coli sepsis with multiorgan failure and brief episodes of intubation. She was discharged to skilled nursing facility   02/26/2014 Imaging PET/CT scan showed near complete remission   02/27/2014 Adverse Reaction Cycle 2 of treatment was resumed with drastic dose adjustment to bendamustine due to recent multi-organ failure   04/03/2014 -  Chemotherapy She is started on maintenance rituximab only.   04/09/2014 - 04/12/2014 Hospital Admission The patient was admitted to the hospital with urinary tract infection and sepsis.    INTERVAL HISTORY: Please see below  for problem oriented charting. The patient is seen as part of recent hospital follow-up. Unfortunately, she developed another bout of severe infection and was hospitalized. She has completed all her antibiotic therapy. She denies urinary frequency, urgency or dysuria.  REVIEW OF SYSTEMS:   Constitutional: Denies fevers, chills or abnormal weight loss Eyes: Denies blurriness of vision Ears, nose, mouth, throat, and face: Denies mucositis or sore throat Respiratory: Denies cough, dyspnea or wheezes Cardiovascular: Denies palpitation, chest discomfort or lower extremity swelling Gastrointestinal:  Denies nausea, heartburn or change in bowel habits Skin: Denies abnormal skin rashes Lymphatics: Denies new lymphadenopathy or easy bruising Neurological:Denies numbness, tingling or new weaknesses Behavioral/Psych: Mood is stable, no new changes  All other systems were reviewed with the patient and are negative.  I have reviewed the past medical history, past surgical history, social history and family history with the patient and they are unchanged from previous note.  ALLERGIES:  is allergic to morphine and related; demerol; meperidine hcl; multaq; oysters; penicillins; and sulfa drugs cross reactors.  MEDICATIONS:  Current Outpatient Prescriptions  Medication Sig Dispense Refill  . acetaminophen (TYLENOL) 500 MG tablet Take 1,000 mg by mouth every 6 (six) hours as needed for mild pain (pain).     Marland Kitchen albuterol (PROAIR HFA) 108 (90 BASE) MCG/ACT inhaler Inhale 2 puffs into the lungs every 6 (six) hours as needed for wheezing or shortness of breath.    . Cholecalciferol (VITAMIN D) 2000 UNITS tablet Take 2,000 Units by mouth daily with lunch.    . conjugated estrogens (PREMARIN) vaginal cream Place 1 Applicatorful vaginally 3 (three) times a week. Monday, Wednesday, and  Friday nights    . digoxin (LANOXIN) 0.125 MG tablet Take 125 mcg by mouth daily.     Marland Kitchen diltiazem (CARDIZEM CD) 120 MG 24 hr  capsule Take 120 mg by mouth daily.     . diphenhydramine-acetaminophen (TYLENOL PM) 25-500 MG TABS Take 1 tablet by mouth at bedtime as needed (sleep).    . diphenoxylate-atropine (LOMOTIL) 2.5-0.025 MG per tablet Take 1 tablet by mouth 4 (four) times daily as needed for diarrhea or loose stools. 90 tablet 0  . ezetimibe (ZETIA) 10 MG tablet Take 10 mg by mouth at bedtime.     . Fluticasone-Salmeterol (ADVAIR) 250-50 MCG/DOSE AEPB Inhale 1 puff into the lungs every 12 (twelve) hours.     Marland Kitchen levothyroxine (SYNTHROID, LEVOTHROID) 125 MCG tablet Take 250 mcg by mouth daily before breakfast.     . lidocaine-prilocaine (EMLA) cream Apply 1 application topically as needed. Apply to [facility] a Cath site at least one hour prior to needle stick as needed. 30 g 3  . Magnesium Oxide 500 MG TABS Take 500 mg by mouth at bedtime.    . metFORMIN (GLUCOPHAGE-XR) 500 MG 24 hr tablet Take 500 mg by mouth 2 (two) times daily with a meal.    . montelukast (SINGULAIR) 10 MG tablet Take 10 mg by mouth daily.    Marland Kitchen omega-3 acid ethyl esters (LOVAZA) 1 G capsule Take 1 g by mouth 2 (two) times daily.    . potassium chloride SA (K-DUR,KLOR-CON) 20 MEQ tablet Take 1 tablet (20 mEq total) by mouth daily. 30 tablet 5  . pravastatin (PRAVACHOL) 40 MG tablet Take 20 mg by mouth See admin instructions. Take 1/2 tablet (20 mg) ONLY on Monday, Tuesday, Wednesday, and Thursday Nights.    . prochlorperazine (COMPAZINE) 10 MG tablet Take 10 mg by mouth every 8 (eight) hours as needed for nausea (nausea).     . traMADol-acetaminophen (ULTRACET) 37.5-325 MG per tablet Take 1 tablet by mouth every 6 (six) hours as needed. 30 tablet 0  . warfarin (COUMADIN) 5 MG tablet Take 2.5-5 mg by mouth daily. Take 1/2 tablet (2.5 mg) on (Sun, Tues, Thurs, Sat) and Take 1 tablet (5 mg) on (Mon, Wed, Fri)    . ondansetron (ZOFRAN) 8 MG tablet Take 8 mg by mouth every 8 (eight) hours as needed for nausea or vomiting (nausea).      No current  facility-administered medications for this visit.    PHYSICAL EXAMINATION: ECOG PERFORMANCE STATUS: 1 - Symptomatic but completely ambulatory  Filed Vitals:   04/25/14 0943  BP: 112/58  Pulse: 78  Temp: 97.5 F (36.4 C)  Resp: 18   Filed Weights   04/25/14 0943  Weight: 135 lb 14.4 oz (61.644 kg)    GENERAL:alert, no distress and comfortable SKIN: skin color, texture, turgor are normal, no rashes or significant lesions EYES: normal, Conjunctiva are pink and non-injected, sclera clear OROPHARYNX:no exudate, no erythema and lips, buccal mucosa, and tongue normal  NECK: supple, thyroid normal size, non-tender, without nodularity LYMPH:  no palpable lymphadenopathy in the cervical, axillary or inguinal LUNGS: clear to auscultation and percussion with normal breathing effort HEART: Persistent atrial fibrillation without no murmurs and no lower extremity edema ABDOMEN:abdomen soft, non-tender and normal bowel sounds Musculoskeletal:no cyanosis of digits and no clubbing  NEURO: alert & oriented x 3 with fluent speech, no focal motor/sensory deficits  LABORATORY DATA:  I have reviewed the data as listed    Component Value Date/Time   NA 141 04/25/2014  0859   NA 133* 04/12/2014 0529   K 4.6 04/25/2014 0859   K 3.0* 04/12/2014 0529   CL 102 04/12/2014 0529   CO2 25 04/25/2014 0859   CO2 24 04/12/2014 0529   GLUCOSE 143* 04/25/2014 0859   GLUCOSE 141* 04/12/2014 0529   BUN 13.9 04/25/2014 0859   BUN 7 04/12/2014 0529   CREATININE 0.8 04/25/2014 0859   CREATININE 0.51 04/12/2014 0529   CALCIUM 9.8 04/25/2014 0859   CALCIUM 8.4 04/12/2014 0529   PROT 6.1* 04/25/2014 0859   PROT 5.7* 04/12/2014 0529   ALBUMIN 3.6 04/25/2014 0859   ALBUMIN 3.0* 04/12/2014 0529   AST 25 04/25/2014 0859   AST 16 04/12/2014 0529   ALT 17 04/25/2014 0859   ALT 20 04/12/2014 0529   ALKPHOS 100 04/25/2014 0859   ALKPHOS 67 04/12/2014 0529   BILITOT 0.32 04/25/2014 0859   BILITOT 0.6 04/12/2014  0529   GFRNONAA >90 04/12/2014 0529   GFRAA >90 04/12/2014 0529    No results found for: SPEP, UPEP  Lab Results  Component Value Date   WBC 12.2* 04/12/2014   NEUTROABS 9.8* 04/12/2014   HGB 8.4* 04/12/2014   HCT 25.7* 04/12/2014   MCV 92.4 04/12/2014   PLT 201 04/12/2014      Chemistry      Component Value Date/Time   NA 141 04/25/2014 0859   NA 133* 04/12/2014 0529   K 4.6 04/25/2014 0859   K 3.0* 04/12/2014 0529   CL 102 04/12/2014 0529   CO2 25 04/25/2014 0859   CO2 24 04/12/2014 0529   BUN 13.9 04/25/2014 0859   BUN 7 04/12/2014 0529   CREATININE 0.8 04/25/2014 0859   CREATININE 0.51 04/12/2014 0529      Component Value Date/Time   CALCIUM 9.8 04/25/2014 0859   CALCIUM 8.4 04/12/2014 0529   ALKPHOS 100 04/25/2014 0859   ALKPHOS 67 04/12/2014 0529   AST 25 04/25/2014 0859   AST 16 04/12/2014 0529   ALT 17 04/25/2014 0859   ALT 20 04/12/2014 0529   BILITOT 0.32 04/25/2014 0859   BILITOT 0.6 04/12/2014 0529      ASSESSMENT & PLAN:  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  She is scheduled for cycle 2 of the same regimen on 06/06/2014. I am concerned that she have recurrent urosepsis despite being on rituximab only. I plan to order a PET/CT scan before see her back and we will have further discussion with her we want to continue treatment or not.    Anemia in neoplastic disease This is likely due to recent treatment. The patient denies recent history of bleeding such as epistaxis, hematuria or hematochezia. She is asymptomatic from the anemia. I will observe for now.  I will give her blood if she becomes anemic with hemoglobin less than 8 g.   Chronic atrial fibrillation Today, she is rate controlled. She will continue her cardiac medications.    Orders Placed This Encounter  Procedures  . NM PET Image Restag (PS) Skull Base To Thigh    Standing Status:  Future     Number of Occurrences:      Standing Expiration Date: 06/25/2015    Order Specific Question:  Reason for Exam (SYMPTOM  OR DIAGNOSIS REQUIRED)    Answer:  staging lymphoma    Order Specific Question:  Preferred imaging location?    Answer:  [facility]  .  Comprehensive metabolic panel    Standing Status: Future     Number of Occurrences:      Standing Expiration Date: 06/25/2015  . CBC with Differential/Platelet    Standing Status: Future     Number of Occurrences:      Standing Expiration Date: 06/25/2015  . Lactate dehydrogenase    Standing Status: Future     Number of Occurrences:      Standing Expiration Date: 06/25/2015   All questions were answered. The patient knows to call the clinic with any problems, questions or concerns. No barriers to learning was detected. I spent 25 minutes counseling the patient face to face. The total time spent in the appointment was 30 minutes and more than 50% was on counseling and review of test results     [facility], [place], MD 04/25/2014 11:04 AM

## 2014-04-25 NOTE — Assessment & Plan Note (Signed)
This is likely due to recent treatment. The patient denies recent history of bleeding such as epistaxis, hematuria or hematochezia. She is asymptomatic from the anemia. I will observe for now.  I will give her blood if she becomes anemic with hemoglobin less than 8 g.

## 2014-04-25 NOTE — Assessment & Plan Note (Signed)
Patient received her first cycle of Rituxan chemotherapy on 04/03/2014.  She is scheduled for cycle 2 of the same regimen on 06/06/2014. I am concerned that she have recurrent urosepsis despite being on rituximab only. I plan to order a PET/CT scan before see her back and we will have further discussion with her we want to continue treatment or not.

## 2014-04-25 NOTE — Telephone Encounter (Signed)
Pt confirmed labs/ov per 02/25 POF, gave pt AVS.... KJ °

## 2014-05-07 DIAGNOSIS — Z7901 Long term (current) use of anticoagulants: Secondary | ICD-10-CM | POA: Diagnosis not present

## 2014-05-13 DIAGNOSIS — Z7901 Long term (current) use of anticoagulants: Secondary | ICD-10-CM | POA: Diagnosis not present

## 2014-05-28 ENCOUNTER — Other Ambulatory Visit: Payer: Self-pay | Admitting: Hematology and Oncology

## 2014-06-03 DIAGNOSIS — Z7901 Long term (current) use of anticoagulants: Secondary | ICD-10-CM | POA: Diagnosis not present

## 2014-06-05 ENCOUNTER — Ambulatory Visit: Payer: Medicare Other

## 2014-06-05 ENCOUNTER — Other Ambulatory Visit (HOSPITAL_BASED_OUTPATIENT_CLINIC_OR_DEPARTMENT_OTHER): Payer: Medicare Other

## 2014-06-05 ENCOUNTER — Encounter (HOSPITAL_COMMUNITY)
Admission: RE | Admit: 2014-06-05 | Discharge: 2014-06-05 | Disposition: A | Discharge status: Home / Self Care | Payer: Medicare Other | Source: Ambulatory Visit | Attending: Hematology and Oncology | Admitting: Hematology and Oncology

## 2014-06-05 DIAGNOSIS — C8581 Other specified types of non-Hodgkin lymphoma, lymph nodes of head, face, and neck: Secondary | ICD-10-CM | POA: Diagnosis not present

## 2014-06-05 DIAGNOSIS — Z7951 Long term (current) use of inhaled steroids: Secondary | ICD-10-CM | POA: Insufficient documentation

## 2014-06-05 DIAGNOSIS — C8591 Non-Hodgkin lymphoma, unspecified, lymph nodes of head, face, and neck: Secondary | ICD-10-CM | POA: Insufficient documentation

## 2014-06-05 DIAGNOSIS — Z95828 Presence of other vascular implants and grafts: Secondary | ICD-10-CM

## 2014-06-05 DIAGNOSIS — D630 Anemia in neoplastic disease: Secondary | ICD-10-CM

## 2014-06-05 LAB — CBC WITH DIFFERENTIAL/PLATELET
BASO%: 0.9 % (ref 0.0–2.0)
Basophils Absolute: 0.1 10*3/uL (ref 0.0–0.1)
EOS%: 1.4 % (ref 0.0–7.0)
Eosinophils Absolute: 0.1 10*3/uL (ref 0.0–0.5)
HCT: 31.3 % — ABNORMAL LOW (ref 34.8–46.6)
HGB: 10.4 g/dL — ABNORMAL LOW (ref 11.6–15.9)
LYMPH%: 16.5 % (ref 14.0–49.7)
MCH: 29 pg (ref 25.1–34.0)
MCHC: 33.2 g/dL (ref 31.5–36.0)
MCV: 87.3 fL (ref 79.5–101.0)
MONO#: 0.8 10*3/uL (ref 0.1–0.9)
MONO%: 13.7 % (ref 0.0–14.0)
NEUT#: 4.1 10*3/uL (ref 1.5–6.5)
NEUT%: 67.5 % (ref 38.4–76.8)
Platelets: 191 10*3/uL (ref 145–400)
RBC: 3.58 10*6/uL — ABNORMAL LOW (ref 3.70–5.45)
RDW: 15.7 % — ABNORMAL HIGH (ref 11.2–14.5)
WBC: 6 10*3/uL (ref 3.9–10.3)
lymph#: 1 10*3/uL (ref 0.9–3.3)

## 2014-06-05 LAB — HOLD TUBE, BLOOD BANK

## 2014-06-05 LAB — COMPREHENSIVE METABOLIC PANEL (CC13)
ALT: 23 U/L (ref 0–55)
AST: 23 U/L (ref 5–34)
Albumin: 3.8 g/dL (ref 3.5–5.0)
Alkaline Phosphatase: 83 U/L (ref 40–150)
Anion Gap: 10 mEq/L (ref 3–11)
BUN: 16.1 mg/dL (ref 7.0–26.0)
CO2: 24 mEq/L (ref 22–29)
Calcium: 9.4 mg/dL (ref 8.4–10.4)
Chloride: 106 mEq/L (ref 98–109)
Creatinine: 0.8 mg/dL (ref 0.6–1.1)
EGFR: 75 mL/min/{1.73_m2} — ABNORMAL LOW (ref >90)
Glucose: 135 mg/dl (ref 70–140)
Potassium: 4.4 mEq/L (ref 3.5–5.1)
Sodium: 140 mEq/L (ref 136–145)
Total Bilirubin: 0.57 mg/dL (ref 0.20–1.20)
Total Protein: 6.3 g/dL — ABNORMAL LOW (ref 6.4–8.3)

## 2014-06-05 LAB — GLUCOSE, CAPILLARY: Glucose-Capillary: 79 mg/dL (ref 70–99)

## 2014-06-05 LAB — LACTATE DEHYDROGENASE (CC13): LDH: 230 U/L (ref 125–245)

## 2014-06-05 MED ORDER — HEPARIN SOD (PORK) LOCK FLUSH 100 UNIT/ML IV SOLN
500.0000 [IU] | Freq: Once | INTRAVENOUS | Status: AC
  Administered 2014-06-05: 500 [IU] via INTRAVENOUS
  Filled 2014-06-05: qty 5

## 2014-06-05 MED ORDER — SODIUM CHLORIDE 0.9 % IJ SOLN
10.0000 mL | INTRAMUSCULAR | Status: DC | PRN
  Administered 2014-06-05: 10 mL via INTRAVENOUS
  Filled 2014-06-05: qty 10

## 2014-06-05 MED ORDER — FLUDEOXYGLUCOSE F - 18 (FDG) INJECTION
6.6900 | Freq: Once | INTRAVENOUS | Status: AC | PRN
  Administered 2014-06-05: 6.69 via INTRAVENOUS

## 2014-06-05 NOTE — Patient Instructions (Signed)

## 2014-06-06 ENCOUNTER — Ambulatory Visit (HOSPITAL_BASED_OUTPATIENT_CLINIC_OR_DEPARTMENT_OTHER): Payer: Medicare Other | Admitting: Hematology and Oncology

## 2014-06-06 ENCOUNTER — Telehealth: Payer: Self-pay | Admitting: Hematology and Oncology

## 2014-06-06 ENCOUNTER — Ambulatory Visit (HOSPITAL_BASED_OUTPATIENT_CLINIC_OR_DEPARTMENT_OTHER): Payer: Medicare Other

## 2014-06-06 ENCOUNTER — Encounter: Payer: Self-pay | Admitting: Hematology and Oncology

## 2014-06-06 VITALS — BP 123/51 | HR 88 | Temp 98.2°F | Resp 18 | Ht 66.0 in | Wt 135.0 lb

## 2014-06-06 DIAGNOSIS — Z5112 Encounter for antineoplastic immunotherapy: Secondary | ICD-10-CM | POA: Diagnosis not present

## 2014-06-06 DIAGNOSIS — I482 Chronic atrial fibrillation, unspecified: Secondary | ICD-10-CM

## 2014-06-06 DIAGNOSIS — C8591 Non-Hodgkin lymphoma, unspecified, lymph nodes of head, face, and neck: Secondary | ICD-10-CM

## 2014-06-06 DIAGNOSIS — R600 Localized edema: Secondary | ICD-10-CM | POA: Diagnosis not present

## 2014-06-06 DIAGNOSIS — D630 Anemia in neoplastic disease: Secondary | ICD-10-CM

## 2014-06-06 MED ORDER — SODIUM CHLORIDE 0.9 % IV SOLN
Freq: Once | INTRAVENOUS | Status: AC
  Administered 2014-06-06: 11:00:00 via INTRAVENOUS

## 2014-06-06 MED ORDER — SODIUM CHLORIDE 0.9 % IV SOLN
375.0000 mg/m2 | Freq: Once | INTRAVENOUS | Status: AC
  Administered 2014-06-06: 600 mg via INTRAVENOUS
  Filled 2014-06-06: qty 60

## 2014-06-06 MED ORDER — HEPARIN SOD (PORK) LOCK FLUSH 100 UNIT/ML IV SOLN
500.0000 [IU] | Freq: Once | INTRAVENOUS | Status: AC | PRN
  Administered 2014-06-06: 500 [IU]
  Filled 2014-06-06: qty 5

## 2014-06-06 MED ORDER — DIPHENHYDRAMINE HCL 25 MG PO CAPS
50.0000 mg | ORAL_CAPSULE | Freq: Once | ORAL | Status: AC
  Administered 2014-06-06: 50 mg via ORAL

## 2014-06-06 MED ORDER — ACETAMINOPHEN 325 MG PO TABS
650.0000 mg | ORAL_TABLET | Freq: Once | ORAL | Status: AC
  Administered 2014-06-06: 650 mg via ORAL

## 2014-06-06 MED ORDER — DIPHENHYDRAMINE HCL 25 MG PO CAPS
ORAL_CAPSULE | ORAL | Status: AC
  Filled 2014-06-06: qty 2

## 2014-06-06 MED ORDER — ACETAMINOPHEN 325 MG PO TABS
ORAL_TABLET | ORAL | Status: AC
  Filled 2014-06-06: qty 2

## 2014-06-06 MED ORDER — SODIUM CHLORIDE 0.9 % IJ SOLN
10.0000 mL | INTRAMUSCULAR | Status: DC | PRN
  Administered 2014-06-06: 10 mL
  Filled 2014-06-06: qty 10

## 2014-06-06 NOTE — Assessment & Plan Note (Signed)
This has improved. I recommend she continue to hold off resuming hydrochlorothiazide

## 2014-06-06 NOTE — Assessment & Plan Note (Signed)
Patient received her first cycle of maintenance Rituxan chemotherapy on 04/03/2014.   I am concerned that she have recurrent urosepsis despite being on rituximab only. Recent PET/CT scan showed complete response to treatment. We will continue treatment every other month for total of 2 years.

## 2014-06-06 NOTE — Progress Notes (Signed)
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   12/07/2013 Pathology Results Accession: [id number] biopsies show high-grade follicular lymphoma.   01/03/2014 Bone Marrow Biopsy Accession: [id number] Bone marrow biopsy is negative   01/04/2014 Imaging ECHO showed normal EF   01/04/2014 Procedure She has placement of port   01/09/2014 - 03/07/2014 Chemotherapy She was given treatment with bendamustine with rituximab. Treatment was stopped due to severe side-effects despite significant dose adjustment for cycle 2   01/18/2014 - 02/01/2014 Hospital Admission She was admitted to the hospital from Escherichia coli sepsis with multiorgan failure and brief episodes of intubation. She was discharged to skilled nursing facility   02/26/2014 Imaging PET/CT scan showed near complete remission   02/27/2014 Adverse Reaction Cycle 2 of treatment was resumed with drastic dose adjustment to bendamustine due to recent multi-organ failure   04/03/2014 -  Chemotherapy She is started on maintenance rituximab only.   04/09/2014 - 04/12/2014 Hospital Admission The patient was admitted to the hospital with urinary tract infection and sepsis.   06/05/2014 Imaging PET CT scan showed complete response to  Rx    INTERVAL HISTORY: Please see below for problem oriented charting. She returns prior to cycle 2 of treatment. She denies lymphadenopathy. No recent infection. No recent nausea, vomiting or diarrhea. The patient denies any recent signs or symptoms of bleeding such as spontaneous epistaxis, hematuria or hematochezia.   REVIEW OF SYSTEMS:   Constitutional: Denies fevers, chills or abnormal weight loss Eyes: Denies blurriness of vision Ears, nose, mouth, throat, and face: Denies mucositis or sore throat Respiratory: Denies cough, dyspnea or wheezes Cardiovascular: Denies palpitation, chest discomfort or lower extremity swelling Gastrointestinal:  Denies nausea, heartburn or change in bowel habits Skin: Denies abnormal skin rashes Lymphatics: Denies new lymphadenopathy or easy bruising Neurological:Denies numbness, tingling or new weaknesses Behavioral/Psych: Mood is stable, no new changes  All other systems were reviewed with the patient and are negative.  I have reviewed the past medical history, past surgical history, social history and family history with the patient and they are unchanged from previous note.  ALLERGIES:  is allergic to morphine and related; demerol; meperidine hcl; multaq; oysters; penicillins; and sulfa drugs cross reactors.  MEDICATIONS:  Current Outpatient Prescriptions  Medication Sig Dispense Refill  . acetaminophen (TYLENOL) 500 MG tablet Take 1,000 mg by mouth every 6 (six) hours as needed for mild pain (pain).     Marland Kitchen albuterol (PROAIR HFA) 108 (90 BASE) MCG/ACT inhaler Inhale 2 puffs into the lungs every 6 (six) hours as needed for wheezing or shortness of breath.    . Cholecalciferol (VITAMIN D) 2000 UNITS tablet Take 2,000 Units by mouth daily with lunch.    . conjugated estrogens (PREMARIN) vaginal  cream Place 1 Applicatorful vaginally 3 (three) times a week. Monday, Wednesday, and Friday nights    . digoxin (LANOXIN) 0.125 MG tablet Take 125 mcg by  mouth daily.     Marland Kitchen diltiazem (CARDIZEM CD) 120 MG 24 hr capsule Take 120 mg by mouth daily.     . diphenhydramine-acetaminophen (TYLENOL PM) 25-500 MG TABS Take 1 tablet by mouth at bedtime as needed (sleep).    . diphenoxylate-atropine (LOMOTIL) 2.5-0.025 MG per tablet Take 1 tablet by mouth 4 (four) times daily as needed for diarrhea or loose stools. 90 tablet 0  . ezetimibe (ZETIA) 10 MG tablet Take 10 mg by mouth at bedtime.     . Fluticasone-Salmeterol (ADVAIR) 250-50 MCG/DOSE AEPB Inhale 1 puff into the lungs every 12 (twelve) hours.     Marland Kitchen levothyroxine (SYNTHROID, LEVOTHROID) 125 MCG tablet Take 250 mcg by mouth daily before breakfast.     . lidocaine-prilocaine (EMLA) cream Apply 1 application topically as needed. Apply to [facility] a Cath site at least one hour prior to needle stick as needed. 30 g 3  . Magnesium Oxide 500 MG TABS Take 500 mg by mouth at bedtime.    . metFORMIN (GLUCOPHAGE-XR) 500 MG 24 hr tablet Take 500 mg by mouth 2 (two) times daily with a meal.    . montelukast (SINGULAIR) 10 MG tablet Take 10 mg by mouth daily.    Marland Kitchen omega-3 acid ethyl esters (LOVAZA) 1 G capsule Take 1 g by mouth 2 (two) times daily.    . ondansetron (ZOFRAN) 8 MG tablet Take 8 mg by mouth every 8 (eight) hours as needed for nausea or vomiting (nausea).     . potassium chloride SA (K-DUR,KLOR-CON) 20 MEQ tablet Take 1 tablet (20 mEq total) by mouth daily. 30 tablet 5  . pravastatin (PRAVACHOL) 40 MG tablet Take 20 mg by mouth See admin instructions. Take 1/2 tablet (20 mg) ONLY on Monday, Tuesday, Wednesday, and Thursday Nights.    . prochlorperazine (COMPAZINE) 10 MG tablet Take 10 mg by mouth every 8 (eight) hours as needed for nausea (nausea).     . traMADol-acetaminophen (ULTRACET) 37.5-325 MG per tablet Take 1 tablet by mouth every 6 (six) hours as needed. 30 tablet 0  . warfarin (COUMADIN) 5 MG tablet Take 2.5-5 mg by mouth daily. Take 1/2 tablet (2.5 mg) on (Sun, Tues, Thurs, Sat) and Take 1 tablet  (5 mg) on (Mon, Wed, Fri)     No current facility-administered medications for this visit.    PHYSICAL EXAMINATION: ECOG PERFORMANCE STATUS: 0 - Asymptomatic  Filed Vitals:   06/06/14 0915  BP: 123/51  Pulse: 88  Temp: 98.2 F (36.8 C)  Resp: 18   Filed Weights   06/06/14 0915  Weight: 135 lb (61.236 kg)    GENERAL:alert, no distress and comfortable SKIN: skin color, texture, turgor are normal, no rashes or significant lesions EYES: normal, Conjunctiva are pink and non-injected, sclera clear OROPHARYNX:no exudate, no erythema and lips, buccal mucosa, and tongue normal  NECK: supple, thyroid normal size, non-tender, without nodularity LYMPH:  no palpable lymphadenopathy in the cervical, axillary or inguinal LUNGS: clear to auscultation and percussion with normal breathing effort HEART: regular rate & rhythm and no murmurs and no lower extremity edema ABDOMEN:abdomen soft, non-tender and normal bowel sounds Musculoskeletal:no cyanosis of digits and no clubbing  NEURO: alert & oriented x 3 with fluent speech, no focal motor/sensory deficits  LABORATORY DATA:  I have reviewed the data as listed  Component Value Date/Time   NA 140 06/05/2014 0810   NA 133* 04/12/2014 0529   K 4.4 06/05/2014 0810   K 3.0* 04/12/2014 0529   CL 102 04/12/2014 0529   CO2 24 06/05/2014 0810   CO2 24 04/12/2014 0529   GLUCOSE 135 06/05/2014 0810   GLUCOSE 141* 04/12/2014 0529   BUN 16.1 06/05/2014 0810   BUN 7 04/12/2014 0529   CREATININE 0.8 06/05/2014 0810   CREATININE 0.51 04/12/2014 0529   CALCIUM 9.4 06/05/2014 0810   CALCIUM 8.4 04/12/2014 0529   PROT 6.3* 06/05/2014 0810   PROT 5.7* 04/12/2014 0529   ALBUMIN 3.8 06/05/2014 0810   ALBUMIN 3.0* 04/12/2014 0529   AST 23 06/05/2014 0810   AST 16 04/12/2014 0529   ALT 23 06/05/2014 0810   ALT 20 04/12/2014 0529   ALKPHOS 83 06/05/2014 0810   ALKPHOS 67 04/12/2014 0529   BILITOT 0.57 06/05/2014 0810   BILITOT 0.6 04/12/2014 0529    GFRNONAA >90 04/12/2014 0529   GFRAA >90 04/12/2014 0529    No results found for: SPEP, UPEP  Lab Results  Component Value Date   WBC 6.0 06/05/2014   NEUTROABS 4.1 06/05/2014   HGB 10.4* 06/05/2014   HCT 31.3* 06/05/2014   MCV 87.3 06/05/2014   PLT 191 06/05/2014      Chemistry      Component Value Date/Time   NA 140 06/05/2014 0810   NA 133* 04/12/2014 0529   K 4.4 06/05/2014 0810   K 3.0* 04/12/2014 0529   CL 102 04/12/2014 0529   CO2 24 06/05/2014 0810   CO2 24 04/12/2014 0529   BUN 16.1 06/05/2014 0810   BUN 7 04/12/2014 0529   CREATININE 0.8 06/05/2014 0810   CREATININE 0.51 04/12/2014 0529      Component Value Date/Time   CALCIUM 9.4 06/05/2014 0810   CALCIUM 8.4 04/12/2014 0529   ALKPHOS 83 06/05/2014 0810   ALKPHOS 67 04/12/2014 0529   AST 23 06/05/2014 0810   AST 16 04/12/2014 0529   ALT 23 06/05/2014 0810   ALT 20 04/12/2014 0529   BILITOT 0.57 06/05/2014 0810   BILITOT 0.6 04/12/2014 0529       RADIOGRAPHIC STUDIES: I have personally reviewed the radiological images as listed and agreed with the findings in the report. Nm Pet Image Restag (ps) Skull Base To Thigh  06/05/2014   CLINICAL DATA:  Subsequent treatment strategy for lymphoma. high-grade follicular lymphoma.  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 Full-ring PET imaging was performed from the skull base to thigh after the radiotracer. CT data was obtained and used for attenuation correction and anatomic localization.  FASTING BLOOD GLUCOSE:  Value: 79 mg/dl  COMPARISON:  PET-CT 02/26/2014  FINDINGS: NECK  No hypermetabolic lymph nodes in the neck.  CHEST  No hypermetabolic mediastinal or hilar nodes. No suspicious pulmonary nodules on the CT scan.  ABDOMEN/PELVIS  No abnormal hypermetabolic activity  within the liver, pancreas, adrenal glands, or spleen. Normal volume spleen. No hypermetabolic lymph nodes in the abdomen or pelvis. Physiologic activity noted throughout the colon.  Atherosclerotic calcification aorta. Diffuse low-attenuation the liver.  SKELETON  No focal hypermetabolic activity to suggest skeletal metastasis.  IMPRESSION: 1. No evidence of active lymphoma on skullbase to thigh scan. 2. Normal spleen. 3. No abnormal  marrow activity. 4. Atherosclerotic disease of the aorta. 5. Hepatic steatosis   Electronically Signed   By: Suzy Bouchard M.D.   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   I am concerned that she have recurrent urosepsis despite being on rituximab only. Recent PET/CT scan showed complete response to treatment. We will continue treatment every other month for total of 2 years.   Anemia in neoplastic disease This is likely due to recent treatment and anemia of chronic disease. The patient denies recent history of bleeding such as epistaxis, hematuria or hematochezia. She is asymptomatic from the anemia. I will observe for now.      Bilateral leg edema This has improved. I recommend she continue to hold off resuming hydrochlorothiazide   Chronic atrial fibrillation Today, she is rate controlled. She will continue her cardiac medications. She will remain on anticoagulation therapy without bleeding complication.    Orders Placed This Encounter  Procedures  . CBC with Differential/Platelet    Standing Status: Future     Number of Occurrences:      Standing Expiration Date: 07/11/2015  . Comprehensive metabolic panel    Standing Status: Future     Number of Occurrences:      Standing Expiration Date: 07/11/2015   All questions were answered. The patient knows to call the clinic with any problems, questions or  concerns. No barriers to learning was detected. I spent 25 minutes counseling the patient face to face. The total time spent in the appointment was 30 minutes and more than 50% was on counseling and review of test results     [place], Coleman Kalas, MD 06/06/2014 10:04 AM

## 2014-06-06 NOTE — Patient Instructions (Signed)

## 2014-06-06 NOTE — Assessment & Plan Note (Signed)
Today, she is rate controlled. She will continue her cardiac medications. She will remain on anticoagulation therapy without bleeding complication.

## 2014-06-06 NOTE — Assessment & Plan Note (Signed)
This is likely due to recent treatment and anemia of chronic disease. The patient denies recent history of bleeding such as epistaxis, hematuria or hematochezia. She is asymptomatic from the anemia. I will observe for now.  

## 2014-06-06 NOTE — Telephone Encounter (Signed)
gave and printed appt sched and avs for pt for June...sed added tx.

## 2014-06-07 ENCOUNTER — Telehealth: Payer: Self-pay | Admitting: *Deleted

## 2014-06-07 NOTE — Telephone Encounter (Signed)
NOTIFIED PT. THAT WIG VOUCHER IS READY FOR PICK UP AT [facility].

## 2014-06-13 DIAGNOSIS — Z7901 Long term (current) use of anticoagulants: Secondary | ICD-10-CM | POA: Diagnosis not present

## 2014-06-20 ENCOUNTER — Other Ambulatory Visit: Payer: Self-pay | Admitting: *Deleted

## 2014-06-20 NOTE — Telephone Encounter (Signed)
Refill request from [facility] for Acyclovir.  Dr. Alvy Bimler signed "do not refill" and faxed back to pharmacy.

## 2014-07-09 DIAGNOSIS — Z7901 Long term (current) use of anticoagulants: Secondary | ICD-10-CM | POA: Diagnosis not present

## 2014-07-23 DIAGNOSIS — Z7901 Long term (current) use of anticoagulants: Secondary | ICD-10-CM | POA: Diagnosis not present

## 2014-07-26 ENCOUNTER — Encounter: Payer: Self-pay | Admitting: Internal Medicine

## 2014-08-08 ENCOUNTER — Ambulatory Visit (HOSPITAL_BASED_OUTPATIENT_CLINIC_OR_DEPARTMENT_OTHER): Payer: Medicare Other | Admitting: Hematology and Oncology

## 2014-08-08 ENCOUNTER — Telehealth: Payer: Self-pay | Admitting: Hematology and Oncology

## 2014-08-08 ENCOUNTER — Ambulatory Visit (HOSPITAL_BASED_OUTPATIENT_CLINIC_OR_DEPARTMENT_OTHER): Payer: Medicare Other

## 2014-08-08 ENCOUNTER — Other Ambulatory Visit (HOSPITAL_BASED_OUTPATIENT_CLINIC_OR_DEPARTMENT_OTHER): Payer: Medicare Other

## 2014-08-08 ENCOUNTER — Encounter: Payer: Self-pay | Admitting: Hematology and Oncology

## 2014-08-08 VITALS — BP 132/70 | HR 68 | Temp 98.1°F | Resp 17 | Ht 66.0 in | Wt 134.7 lb

## 2014-08-08 VITALS — BP 135/59 | HR 61 | Temp 96.0°F | Resp 18

## 2014-08-08 DIAGNOSIS — C8591 Non-Hodgkin lymphoma, unspecified, lymph nodes of head, face, and neck: Secondary | ICD-10-CM

## 2014-08-08 DIAGNOSIS — Z5112 Encounter for antineoplastic immunotherapy: Secondary | ICD-10-CM | POA: Diagnosis not present

## 2014-08-08 DIAGNOSIS — D630 Anemia in neoplastic disease: Secondary | ICD-10-CM | POA: Diagnosis not present

## 2014-08-08 DIAGNOSIS — R197 Diarrhea, unspecified: Secondary | ICD-10-CM

## 2014-08-08 LAB — CBC WITH DIFFERENTIAL/PLATELET
BASO%: 0.6 % (ref 0.0–2.0)
Basophils Absolute: 0 10*3/uL (ref 0.0–0.1)
EOS%: 1.7 % (ref 0.0–7.0)
Eosinophils Absolute: 0.1 10*3/uL (ref 0.0–0.5)
HCT: 32.8 % — ABNORMAL LOW (ref 34.8–46.6)
HGB: 11 g/dL — ABNORMAL LOW (ref 11.6–15.9)
LYMPH#: 1 10*3/uL (ref 0.9–3.3)
LYMPH%: 14.2 % (ref 14.0–49.7)
MCH: 28.9 pg (ref 25.1–34.0)
MCHC: 33.7 g/dL (ref 31.5–36.0)
MCV: 85.9 fL (ref 79.5–101.0)
MONO#: 0.7 10*3/uL (ref 0.1–0.9)
MONO%: 9.2 % (ref 0.0–14.0)
NEUT%: 74.3 % (ref 38.4–76.8)
NEUTROS ABS: 5.4 10*3/uL (ref 1.5–6.5)
Platelets: 190 10*3/uL (ref 145–400)
RBC: 3.81 10*6/uL (ref 3.70–5.45)
RDW: 15.4 % — ABNORMAL HIGH (ref 11.2–14.5)
WBC: 7.3 10*3/uL (ref 3.9–10.3)

## 2014-08-08 LAB — COMPREHENSIVE METABOLIC PANEL (CC13)
ALBUMIN: 3.9 g/dL (ref 3.5–5.0)
ALT: 15 U/L (ref 0–55)
ANION GAP: 9 meq/L (ref 3–11)
AST: 18 U/L (ref 5–34)
Alkaline Phosphatase: 81 U/L (ref 40–150)
BUN: 14.9 mg/dL (ref 7.0–26.0)
CO2: 25 meq/L (ref 22–29)
CREATININE: 0.8 mg/dL (ref 0.6–1.1)
Calcium: 9.6 mg/dL (ref 8.4–10.4)
Chloride: 105 mEq/L (ref 98–109)
EGFR: 75 mL/min/{1.73_m2} — ABNORMAL LOW (ref >90)
GLUCOSE: 137 mg/dL (ref 70–140)
Potassium: 4.2 mEq/L (ref 3.5–5.1)
SODIUM: 140 meq/L (ref 136–145)
Total Bilirubin: 0.61 mg/dL (ref 0.20–1.20)
Total Protein: 6.3 g/dL — ABNORMAL LOW (ref 6.4–8.3)

## 2014-08-08 MED ORDER — DIPHENHYDRAMINE HCL 25 MG PO CAPS
ORAL_CAPSULE | ORAL | Status: AC
  Filled 2014-08-08: qty 2

## 2014-08-08 MED ORDER — SODIUM CHLORIDE 0.9 % IJ SOLN
10.0000 mL | INTRAMUSCULAR | Status: DC | PRN
  Administered 2014-08-08: 10 mL
  Filled 2014-08-08: qty 10

## 2014-08-08 MED ORDER — SODIUM CHLORIDE 0.9 % IV SOLN
375.0000 mg/m2 | Freq: Once | INTRAVENOUS | Status: AC
  Administered 2014-08-08: 600 mg via INTRAVENOUS
  Filled 2014-08-08: qty 60

## 2014-08-08 MED ORDER — SODIUM CHLORIDE 0.9 % IV SOLN
Freq: Once | INTRAVENOUS | Status: AC
  Administered 2014-08-08: 10:00:00 via INTRAVENOUS

## 2014-08-08 MED ORDER — DIPHENHYDRAMINE HCL 25 MG PO CAPS
50.0000 mg | ORAL_CAPSULE | Freq: Once | ORAL | Status: AC
  Administered 2014-08-08: 50 mg via ORAL

## 2014-08-08 MED ORDER — ACETAMINOPHEN 325 MG PO TABS
650.0000 mg | ORAL_TABLET | Freq: Once | ORAL | Status: AC
  Administered 2014-08-08: 650 mg via ORAL

## 2014-08-08 MED ORDER — DIPHENOXYLATE-ATROPINE 2.5-0.025 MG PO TABS
1.0000 | ORAL_TABLET | Freq: Four times a day (QID) | ORAL | Status: DC | PRN
Start: 2014-08-08 — End: 2015-11-24

## 2014-08-08 MED ORDER — ACETAMINOPHEN 325 MG PO TABS
ORAL_TABLET | ORAL | Status: AC
  Filled 2014-08-08: qty 2

## 2014-08-08 MED ORDER — HEPARIN SOD (PORK) LOCK FLUSH 100 UNIT/ML IV SOLN
500.0000 [IU] | Freq: Once | INTRAVENOUS | Status: AC | PRN
  Administered 2014-08-08: 500 [IU]
  Filled 2014-08-08: qty 5

## 2014-08-08 NOTE — Assessment & Plan Note (Signed)
This is likely due to recent treatment and anemia of chronic disease. The patient denies recent history of bleeding such as epistaxis, hematuria or hematochezia. She is asymptomatic from the anemia. I will observe for now.  

## 2014-08-08 NOTE — Assessment & Plan Note (Signed)
Patient received her first cycle of maintenance Rituxan chemotherapy on 04/03/2014.   I am concerned that she have recurrent urosepsis despite being on rituximab only. Recent PET/CT scan showed complete response to treatment. We will continue treatment every other month for total of 2 years. Her next imaging study would be due in October 2016

## 2014-08-08 NOTE — Telephone Encounter (Signed)
s.w. pt and advised on added appt....pt ok and will get sched from chemo

## 2014-08-08 NOTE — Progress Notes (Signed)
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   01/03/2014 Bone Marrow Biopsy Accession: [id number] Bone marrow biopsy is negative   01/04/2014 Imaging ECHO showed normal EF   01/04/2014 Procedure She has placement of port   01/09/2014 - 03/07/2014 Chemotherapy She was given treatment with bendamustine with rituximab. Treatment was stopped due to severe side-effects despite significant dose adjustment for cycle 2   01/18/2014 - 02/01/2014 Hospital Admission She was admitted to the hospital from Escherichia coli sepsis with multiorgan failure and brief episodes of intubation. She was discharged to skilled nursing facility   02/26/2014 Imaging PET/CT scan showed near complete remission   02/27/2014 Adverse Reaction Cycle 2 of treatment was resumed with drastic dose adjustment to bendamustine due to recent multi-organ failure   04/03/2014 -  Chemotherapy She is started on maintenance rituximab only.   04/09/2014 - 04/12/2014 Hospital Admission The patient was admitted to the hospital with urinary tract infection and sepsis.   06/05/2014 Imaging PET CT scan showed complete response to  Rx    INTERVAL HISTORY: Please see below for problem oriented charting. She returns today for further follow-up and prior to rituximab infusion She feels well. Denies new lymphadenopathy. Denies recent infection. No recent nausea or vomiting or change in bowel habits The patient denies any recent signs or symptoms of bleeding such as spontaneous epistaxis, hematuria or hematochezia.   REVIEW OF SYSTEMS:   Constitutional: Denies fevers, chills or abnormal weight loss Eyes: Denies blurriness of vision Ears, nose, mouth, throat, and face: Denies mucositis or sore throat Respiratory: Denies cough, dyspnea or wheezes Cardiovascular: Denies palpitation, chest discomfort or lower extremity swelling Gastrointestinal:  Denies nausea, heartburn or change in bowel habits Skin: Denies abnormal skin rashes Lymphatics: Denies new lymphadenopathy or easy bruising Neurological:Denies numbness, tingling or new weaknesses Behavioral/Psych: Mood is stable, no new changes  All other systems were reviewed with the patient and are negative.  I have reviewed the past medical history, past surgical history, social history and family history with the patient and they are unchanged from previous note.  ALLERGIES:  is allergic to morphine and related; demerol; meperidine hcl; multaq; oysters; penicillins; and sulfa drugs cross reactors.  MEDICATIONS:  Current Outpatient Prescriptions  Medication Sig Dispense Refill  . acetaminophen (TYLENOL) 500 MG tablet Take 1,000 mg by mouth every 6 (six) hours as needed for mild pain (pain).     Marland Kitchen albuterol (PROAIR HFA) 108 (90 BASE) MCG/ACT inhaler Inhale 2 puffs into the lungs every 6 (six) hours as needed for wheezing or shortness of breath.    . Cholecalciferol (VITAMIN D) 2000 UNITS tablet Take 2,000 Units by mouth daily  with lunch.    . conjugated estrogens (PREMARIN) vaginal cream Place 1 Applicatorful vaginally 3 (three) times a week. Monday, Wednesday, and  Friday nights    . digoxin (LANOXIN) 0.125 MG tablet Take 125 mcg by mouth daily.     Marland Kitchen diltiazem (CARDIZEM CD) 120 MG 24 hr capsule Take 120 mg by mouth daily.     . diphenhydramine-acetaminophen (TYLENOL PM) 25-500 MG TABS Take 1 tablet by mouth at bedtime as needed (sleep).    . diphenoxylate-atropine (LOMOTIL) 2.5-0.025 MG per tablet Take 1 tablet by mouth 4 (four) times daily as needed for diarrhea or loose stools. 90 tablet 0  . ezetimibe (ZETIA) 10 MG tablet Take 10 mg by mouth at bedtime.     . Fluticasone-Salmeterol (ADVAIR) 250-50 MCG/DOSE AEPB Inhale 1 puff into the lungs every 12 (twelve) hours.     Marland Kitchen levothyroxine (SYNTHROID, LEVOTHROID) 125 MCG tablet Take 250 mcg by mouth daily before breakfast.     . lidocaine-prilocaine (EMLA) cream Apply 1 application topically as needed. Apply to [facility] a Cath site at least one hour prior to needle stick as needed. 30 g 3  . Magnesium Oxide 500 MG TABS Take 500 mg by mouth at bedtime.    . metFORMIN (GLUCOPHAGE-XR) 500 MG 24 hr tablet Take 500 mg by mouth 2 (two) times daily with a meal.    . montelukast (SINGULAIR) 10 MG tablet Take 10 mg by mouth daily.    Marland Kitchen omega-3 acid ethyl esters (LOVAZA) 1 G capsule Take 1 g by mouth 2 (two) times daily.    . potassium chloride SA (K-DUR,KLOR-CON) 20 MEQ tablet Take 1 tablet (20 mEq total) by mouth daily. 30 tablet 5  . pravastatin (PRAVACHOL) 40 MG tablet Take 20 mg by mouth See admin instructions. Take 1/2 tablet (20 mg) ONLY on Monday, Tuesday, Wednesday, and Thursday Nights.    . traMADol-acetaminophen (ULTRACET) 37.5-325 MG per tablet Take 1 tablet by mouth every 6 (six) hours as needed. 30 tablet 0  . warfarin (COUMADIN) 5 MG tablet Take 2.5-5 mg by mouth daily. Take 1/2 tablet (2.5 mg) on (Sun, Tues, Thurs, Sat) and Take 1 tablet (5 mg) on (Mon, Wed, Fri)    . ondansetron (ZOFRAN) 8 MG tablet Take 8 mg by mouth every 8 (eight) hours as needed for nausea or vomiting (nausea).     . prochlorperazine  (COMPAZINE) 10 MG tablet Take 10 mg by mouth every 8 (eight) hours as needed for nausea (nausea).      No current facility-administered medications for this visit.   Facility-Administered Medications Ordered in Other Visits  Medication Dose Route Frequency Provider Last Rate Last Dose  . sodium chloride 0.9 % injection 10 mL  10 mL Intracatheter PRN Heath Lark, MD   10 mL at 08/08/14 1240    PHYSICAL EXAMINATION: ECOG PERFORMANCE STATUS: 0 - Asymptomatic  Filed Vitals:   08/08/14 0822  BP: 132/70  Pulse: 68  Temp: 98.1 F (36.7 C)  Resp: 17   Filed Weights   08/08/14 0822  Weight: 134 lb 11.2 oz (61.1 kg)    GENERAL:alert, no distress and comfortable SKIN: skin color, texture, turgor are normal, no rashes or significant lesions EYES: normal, Conjunctiva are pink and non-injected, sclera clear OROPHARYNX:no exudate, no erythema and lips, buccal mucosa, and tongue normal  NECK: supple, thyroid normal size, non-tender, without nodularity LYMPH:  no palpable lymphadenopathy in the cervical, axillary or inguinal LUNGS: clear to auscultation and percussion with normal breathing effort  HEART: regular rate & rhythm and no murmurs and no lower extremity edema ABDOMEN:abdomen soft, non-tender and normal bowel sounds Musculoskeletal:no cyanosis of digits and no clubbing  NEURO: alert & oriented x 3 with fluent speech, no focal motor/sensory deficits  LABORATORY DATA:  I have reviewed the data as listed    Component Value Date/Time   NA 140 08/08/2014 0805   NA 133* 04/12/2014 0529   K 4.2 08/08/2014 0805   K 3.0* 04/12/2014 0529   CL 102 04/12/2014 0529   CO2 25 08/08/2014 0805   CO2 24 04/12/2014 0529   GLUCOSE 137 08/08/2014 0805   GLUCOSE 141* 04/12/2014 0529   BUN 14.9 08/08/2014 0805   BUN 7 04/12/2014 0529   CREATININE 0.8 08/08/2014 0805   CREATININE 0.51 04/12/2014 0529   CALCIUM 9.6 08/08/2014 0805   CALCIUM 8.4 04/12/2014 0529   PROT 6.3* 08/08/2014 0805   PROT  5.7* 04/12/2014 0529   ALBUMIN 3.9 08/08/2014 0805   ALBUMIN 3.0* 04/12/2014 0529   AST 18 08/08/2014 0805   AST 16 04/12/2014 0529   ALT 15 08/08/2014 0805   ALT 20 04/12/2014 0529   ALKPHOS 81 08/08/2014 0805   ALKPHOS 67 04/12/2014 0529   BILITOT 0.61 08/08/2014 0805   BILITOT 0.6 04/12/2014 0529   GFRNONAA >90 04/12/2014 0529   GFRAA >90 04/12/2014 0529    No results found for: SPEP, UPEP  Lab Results  Component Value Date   WBC 7.3 08/08/2014   NEUTROABS 5.4 08/08/2014   HGB 11.0* 08/08/2014   HCT 32.8* 08/08/2014   MCV 85.9 08/08/2014   PLT 190 08/08/2014      Chemistry      Component Value Date/Time   NA 140 08/08/2014 0805   NA 133* 04/12/2014 0529   K 4.2 08/08/2014 0805   K 3.0* 04/12/2014 0529   CL 102 04/12/2014 0529   CO2 25 08/08/2014 0805   CO2 24 04/12/2014 0529   BUN 14.9 08/08/2014 0805   BUN 7 04/12/2014 0529   CREATININE 0.8 08/08/2014 0805   CREATININE 0.51 04/12/2014 0529      Component Value Date/Time   CALCIUM 9.6 08/08/2014 0805   CALCIUM 8.4 04/12/2014 0529   ALKPHOS 81 08/08/2014 0805   ALKPHOS 67 04/12/2014 0529   AST 18 08/08/2014 0805   AST 16 04/12/2014 0529   ALT 15 08/08/2014 0805   ALT 20 04/12/2014 0529   BILITOT 0.61 08/08/2014 0805   BILITOT 0.6 04/12/2014 0529       ASSESSMENT & PLAN:  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   I am concerned that she have recurrent urosepsis despite being on rituximab only. Recent PET/CT scan showed complete response to treatment. We will continue treatment every other month for total of 2 years. 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 The patient denies recent history of bleeding such as epistaxis, hematuria or hematochezia. She is asymptomatic from the anemia. I  will observe for now.        No orders of the defined types were placed in this encounter.   All questions were answered. The patient knows to call the clinic with any problems, questions or concerns. No barriers to learning was detected. I spent 15 minutes counseling the patient face to face.  The total time spent in the appointment was 20 minutes and more than 50% was on counseling and review of test results     [facility], [place], MD 08/08/2014 12:59 PM

## 2014-08-08 NOTE — Patient Instructions (Signed)
[facility] Discharge Instructions for Patients Receiving Chemotherapy  Today you received the following chemotherapy agents Rituxan  To help prevent nausea and vomiting after your treatment, we encourage you to take your nausea medication    If you develop nausea and vomiting that is not controlled by your nausea medication, call the clinic.   BELOW ARE SYMPTOMS THAT SHOULD BE REPORTED IMMEDIATELY:  *FEVER GREATER THAN 100.5 F  *CHILLS WITH OR WITHOUT FEVER  NAUSEA AND VOMITING THAT IS NOT CONTROLLED WITH YOUR NAUSEA MEDICATION  *UNUSUAL SHORTNESS OF BREATH  *UNUSUAL BRUISING OR BLEEDING  TENDERNESS IN MOUTH AND THROAT WITH OR WITHOUT PRESENCE OF ULCERS  *URINARY PROBLEMS  *BOWEL PROBLEMS  UNUSUAL RASH Items with * indicate a potential emergency and should be followed up as soon as possible.  Feel free to call the clinic you have any questions or concerns. The clinic phone number is [phone].  Please show the CHEMO ALERT CARD at check-in to the Emergency Department and triage nurse.   

## 2014-08-15 DIAGNOSIS — N390 Urinary tract infection, site not specified: Secondary | ICD-10-CM | POA: Diagnosis not present

## 2014-08-15 DIAGNOSIS — E039 Hypothyroidism, unspecified: Secondary | ICD-10-CM | POA: Diagnosis not present

## 2014-08-15 DIAGNOSIS — E119 Type 2 diabetes mellitus without complications: Secondary | ICD-10-CM | POA: Diagnosis not present

## 2014-08-15 DIAGNOSIS — Z0000 Encounter for general adult medical examination without abnormal findings: Secondary | ICD-10-CM | POA: Diagnosis not present

## 2014-08-15 DIAGNOSIS — E780 Pure hypercholesterolemia: Secondary | ICD-10-CM | POA: Diagnosis not present

## 2014-08-15 DIAGNOSIS — I10 Essential (primary) hypertension: Secondary | ICD-10-CM | POA: Diagnosis not present

## 2014-08-20 DIAGNOSIS — Z7901 Long term (current) use of anticoagulants: Secondary | ICD-10-CM | POA: Diagnosis not present

## 2014-08-30 DIAGNOSIS — R05 Cough: Secondary | ICD-10-CM | POA: Diagnosis not present

## 2014-08-30 DIAGNOSIS — J45909 Unspecified asthma, uncomplicated: Secondary | ICD-10-CM | POA: Diagnosis not present

## 2014-08-30 DIAGNOSIS — R509 Fever, unspecified: Secondary | ICD-10-CM | POA: Diagnosis not present

## 2014-08-30 DIAGNOSIS — J45901 Unspecified asthma with (acute) exacerbation: Secondary | ICD-10-CM | POA: Diagnosis not present

## 2014-09-03 DIAGNOSIS — J45909 Unspecified asthma, uncomplicated: Secondary | ICD-10-CM | POA: Diagnosis not present

## 2014-09-03 DIAGNOSIS — Z7901 Long term (current) use of anticoagulants: Secondary | ICD-10-CM | POA: Diagnosis not present

## 2014-09-03 DIAGNOSIS — J45901 Unspecified asthma with (acute) exacerbation: Secondary | ICD-10-CM | POA: Diagnosis not present

## 2014-09-03 DIAGNOSIS — J40 Bronchitis, not specified as acute or chronic: Secondary | ICD-10-CM | POA: Diagnosis not present

## 2014-09-09 DIAGNOSIS — Z7901 Long term (current) use of anticoagulants: Secondary | ICD-10-CM | POA: Diagnosis not present

## 2014-09-23 DIAGNOSIS — E1140 Type 2 diabetes mellitus with diabetic neuropathy, unspecified: Secondary | ICD-10-CM | POA: Diagnosis not present

## 2014-09-23 DIAGNOSIS — I480 Paroxysmal atrial fibrillation: Secondary | ICD-10-CM | POA: Diagnosis not present

## 2014-09-23 DIAGNOSIS — I10 Essential (primary) hypertension: Secondary | ICD-10-CM | POA: Diagnosis not present

## 2014-09-23 DIAGNOSIS — C8591 Non-Hodgkin lymphoma, unspecified, lymph nodes of head, face, and neck: Secondary | ICD-10-CM | POA: Diagnosis not present

## 2014-10-08 DIAGNOSIS — Z7901 Long term (current) use of anticoagulants: Secondary | ICD-10-CM | POA: Diagnosis not present

## 2014-10-10 ENCOUNTER — Telehealth: Payer: Self-pay | Admitting: Hematology and Oncology

## 2014-10-10 ENCOUNTER — Encounter: Payer: Self-pay | Admitting: Hematology and Oncology

## 2014-10-10 ENCOUNTER — Other Ambulatory Visit: Payer: Self-pay | Admitting: Hematology and Oncology

## 2014-10-10 ENCOUNTER — Ambulatory Visit (HOSPITAL_BASED_OUTPATIENT_CLINIC_OR_DEPARTMENT_OTHER): Payer: Medicare Other

## 2014-10-10 ENCOUNTER — Ambulatory Visit: Payer: Medicare Other

## 2014-10-10 ENCOUNTER — Other Ambulatory Visit (HOSPITAL_BASED_OUTPATIENT_CLINIC_OR_DEPARTMENT_OTHER): Payer: Medicare Other

## 2014-10-10 ENCOUNTER — Ambulatory Visit (HOSPITAL_BASED_OUTPATIENT_CLINIC_OR_DEPARTMENT_OTHER): Payer: Medicare Other | Admitting: Hematology and Oncology

## 2014-10-10 VITALS — BP 118/47 | HR 63 | Temp 97.0°F | Resp 18

## 2014-10-10 VITALS — BP 140/55 | HR 67 | Temp 97.5°F | Resp 18 | Ht 66.0 in | Wt 129.9 lb

## 2014-10-10 DIAGNOSIS — D638 Anemia in other chronic diseases classified elsewhere: Secondary | ICD-10-CM

## 2014-10-10 DIAGNOSIS — C8591 Non-Hodgkin lymphoma, unspecified, lymph nodes of head, face, and neck: Secondary | ICD-10-CM

## 2014-10-10 DIAGNOSIS — I4891 Unspecified atrial fibrillation: Secondary | ICD-10-CM

## 2014-10-10 DIAGNOSIS — R300 Dysuria: Secondary | ICD-10-CM | POA: Diagnosis not present

## 2014-10-10 DIAGNOSIS — R197 Diarrhea, unspecified: Secondary | ICD-10-CM | POA: Diagnosis not present

## 2014-10-10 DIAGNOSIS — Z5112 Encounter for antineoplastic immunotherapy: Secondary | ICD-10-CM

## 2014-10-10 DIAGNOSIS — Z95828 Presence of other vascular implants and grafts: Secondary | ICD-10-CM

## 2014-10-10 LAB — COMPREHENSIVE METABOLIC PANEL (CC13)
ALK PHOS: 68 U/L (ref 40–150)
ALT: 23 U/L (ref 0–55)
AST: 21 U/L (ref 5–34)
Albumin: 3.9 g/dL (ref 3.5–5.0)
Anion Gap: 8 mEq/L (ref 3–11)
BILIRUBIN TOTAL: 0.48 mg/dL (ref 0.20–1.20)
BUN: 13 mg/dL (ref 7.0–26.0)
CO2: 25 mEq/L (ref 22–29)
CREATININE: 0.7 mg/dL (ref 0.6–1.1)
Calcium: 9.3 mg/dL (ref 8.4–10.4)
Chloride: 108 mEq/L (ref 98–109)
EGFR: 79 mL/min/{1.73_m2} — AB (ref >90)
GLUCOSE: 142 mg/dL — AB (ref 70–140)
Potassium: 4.2 mEq/L (ref 3.5–5.1)
Sodium: 141 mEq/L (ref 136–145)
Total Protein: 6.1 g/dL — ABNORMAL LOW (ref 6.4–8.3)

## 2014-10-10 LAB — CBC WITH DIFFERENTIAL/PLATELET
BASO%: 0.4 % (ref 0.0–2.0)
BASOS ABS: 0 10*3/uL (ref 0.0–0.1)
EOS ABS: 0.1 10*3/uL (ref 0.0–0.5)
EOS%: 1 % (ref 0.0–7.0)
HCT: 32.2 % — ABNORMAL LOW (ref 34.8–46.6)
HGB: 10.7 g/dL — ABNORMAL LOW (ref 11.6–15.9)
LYMPH%: 14.9 % (ref 14.0–49.7)
MCH: 28.8 pg (ref 25.1–34.0)
MCHC: 33.2 g/dL (ref 31.5–36.0)
MCV: 86.8 fL (ref 79.5–101.0)
MONO#: 0.8 10*3/uL (ref 0.1–0.9)
MONO%: 11.6 % (ref 0.0–14.0)
NEUT%: 72.1 % (ref 38.4–76.8)
NEUTROS ABS: 4.9 10*3/uL (ref 1.5–6.5)
NRBC: 0 % (ref 0–0)
Platelets: 172 10*3/uL (ref 145–400)
RBC: 3.71 10*6/uL (ref 3.70–5.45)
RDW: 15.3 % — ABNORMAL HIGH (ref 11.2–14.5)
WBC: 6.8 10*3/uL (ref 3.9–10.3)
lymph#: 1 10*3/uL (ref 0.9–3.3)

## 2014-10-10 LAB — URINALYSIS, MICROSCOPIC - CHCC
BILIRUBIN (URINE): NEGATIVE
Glucose: NEGATIVE mg/dL
KETONES: NEGATIVE mg/dL
Nitrite: POSITIVE
PH: 6 (ref 4.6–8.0)
PROTEIN: NEGATIVE mg/dL
Specific Gravity, Urine: 1.01 (ref 1.003–1.035)
Urobilinogen, UR: 0.2 mg/dL (ref 0.2–1)

## 2014-10-10 MED ORDER — DIPHENHYDRAMINE HCL 25 MG PO CAPS
ORAL_CAPSULE | ORAL | Status: AC
  Filled 2014-10-10: qty 2

## 2014-10-10 MED ORDER — DIPHENHYDRAMINE HCL 25 MG PO CAPS
50.0000 mg | ORAL_CAPSULE | Freq: Once | ORAL | Status: AC
  Administered 2014-10-10: 50 mg via ORAL

## 2014-10-10 MED ORDER — ACETAMINOPHEN 325 MG PO TABS
ORAL_TABLET | ORAL | Status: AC
  Filled 2014-10-10: qty 2

## 2014-10-10 MED ORDER — SODIUM CHLORIDE 0.9 % IJ SOLN
10.0000 mL | INTRAMUSCULAR | Status: DC | PRN
  Administered 2014-10-10: 10 mL
  Filled 2014-10-10: qty 10

## 2014-10-10 MED ORDER — SODIUM CHLORIDE 0.9 % IV SOLN
Freq: Once | INTRAVENOUS | Status: AC
  Administered 2014-10-10: 09:00:00 via INTRAVENOUS

## 2014-10-10 MED ORDER — SODIUM CHLORIDE 0.9 % IV SOLN
375.0000 mg/m2 | Freq: Once | INTRAVENOUS | Status: AC
  Administered 2014-10-10: 600 mg via INTRAVENOUS
  Filled 2014-10-10: qty 60

## 2014-10-10 MED ORDER — ACETAMINOPHEN 325 MG PO TABS
650.0000 mg | ORAL_TABLET | Freq: Once | ORAL | Status: AC
  Administered 2014-10-10: 650 mg via ORAL

## 2014-10-10 MED ORDER — SODIUM CHLORIDE 0.9 % IJ SOLN
10.0000 mL | INTRAMUSCULAR | Status: DC | PRN
  Administered 2014-10-10: 10 mL via INTRAVENOUS
  Filled 2014-10-10: qty 10

## 2014-10-10 MED ORDER — HEPARIN SOD (PORK) LOCK FLUSH 100 UNIT/ML IV SOLN
500.0000 [IU] | Freq: Once | INTRAVENOUS | Status: AC | PRN
  Administered 2014-10-10: 500 [IU]
  Filled 2014-10-10: qty 5

## 2014-10-10 NOTE — Patient Instructions (Signed)
[facility] Discharge Instructions for Patients Receiving Chemotherapy  Today you received the following chemotherapy agents Rituxan To help prevent nausea and vomiting after your treatment, we encourage you to take your nausea medication as prescribed.  If you develop nausea and vomiting that is not controlled by your nausea medication, call the clinic.   BELOW ARE SYMPTOMS THAT SHOULD BE REPORTED IMMEDIATELY:  *FEVER GREATER THAN 100.5 F  *CHILLS WITH OR WITHOUT FEVER  NAUSEA AND VOMITING THAT IS NOT CONTROLLED WITH YOUR NAUSEA MEDICATION  *UNUSUAL SHORTNESS OF BREATH  *UNUSUAL BRUISING OR BLEEDING  TENDERNESS IN MOUTH AND THROAT WITH OR WITHOUT PRESENCE OF ULCERS  *URINARY PROBLEMS  *BOWEL PROBLEMS  UNUSUAL RASH Items with * indicate a potential emergency and should be followed up as soon as possible.  Feel free to call the clinic you have any questions or concerns. The clinic phone number is [phone].  Please show the CHEMO ALERT CARD at check-in to the Emergency Department and triage nurse.   

## 2014-10-10 NOTE — Assessment & Plan Note (Signed)
The cause is unknown. She had received intermittent anti-biotic therapy and I wonder about possible anti-biotic associated diarrhea or lactose intolerance. I recommend dietary modification. If the diarrhea is persistent, I recommend GI evaluation.

## 2014-10-10 NOTE — Telephone Encounter (Signed)
per pof to sch pt appt-sent MW emailt o sch trmt-pt aware of appt-pt to get updated sch b4 leaving inf today

## 2014-10-10 NOTE — Progress Notes (Signed)
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   12/07/2013 Pathology Results Accession: [id number] biopsies show high-grade follicular lymphoma.   01/03/2014 Bone Marrow Biopsy Accession: [id number] Bone marrow biopsy is negative   01/04/2014 Imaging ECHO showed normal EF   01/04/2014 Procedure She has placement of port   01/09/2014 - 03/07/2014 Chemotherapy She was given treatment with bendamustine with rituximab. Treatment was stopped due to severe side-effects despite significant dose adjustment for cycle 2   01/18/2014 - 02/01/2014 Hospital Admission She was admitted to the hospital from Escherichia coli sepsis with multiorgan failure and brief episodes of intubation. She was discharged to skilled nursing facility   02/26/2014 Imaging PET/CT scan showed near complete remission   02/27/2014 Adverse Reaction Cycle 2 of treatment was resumed with drastic dose adjustment to bendamustine due to recent multi-organ failure   04/03/2014 -  Chemotherapy She is started on maintenance rituximab only.   04/09/2014 - 04/12/2014 Hospital Admission The patient was admitted to the hospital with urinary tract infection and sepsis.   06/05/2014 Imaging PET CT scan showed complete response to  Rx    INTERVAL HISTORY: Please see below for problem oriented charting. She returns today for further treatment. She had one episode of urinary tract infection 2 months ago. Recently, she noted cloudy urine and mild suprapubic discomfort with occasional dysuria. Over the past month, she has significant frequent diarrhea and has lost 5 pounds. She denies new lymphadenopathy. Her appetite is good. She denies fevers or chills. She remain on anticoagulation therapy for atrial fibrillation. The patient denies any recent signs or symptoms of bleeding such as spontaneous epistaxis, hematuria or hematochezia.   REVIEW OF SYSTEMS:   Constitutional: Denies fevers, chills or abnormal weight loss Eyes: Denies blurriness of vision Ears, nose, mouth, throat, and face: Denies mucositis or sore throat Respiratory: Denies cough, dyspnea or wheezes Cardiovascular: Denies palpitation, chest discomfort or lower extremity swelling Skin: Denies abnormal skin rashes Lymphatics: Denies new lymphadenopathy or easy bruising Neurological:Denies numbness, tingling or new weaknesses Behavioral/Psych: Mood is stable, no new changes  All other systems were reviewed with the patient and are negative.  I have reviewed the past medical history, past surgical history, social history and family history with the patient and they are unchanged from previous note.  ALLERGIES:  is allergic to morphine and related; demerol; meperidine hcl; multaq; oysters; penicillins; and sulfa drugs cross reactors.  MEDICATIONS:  Current Outpatient Prescriptions  Medication Sig Dispense Refill  . acetaminophen (TYLENOL) 500 MG tablet Take 1,000 mg by mouth every 6 (six) hours as needed for mild pain (pain).     Marland Kitchen albuterol (PROAIR HFA) 108 (90 BASE) MCG/ACT inhaler Inhale 2 puffs into the lungs every 6 (six) hours  as needed for wheezing or shortness of breath.    . Cholecalciferol (VITAMIN D) 2000 UNITS tablet Take 2,000 Units by mouth  daily with lunch.    . conjugated estrogens (PREMARIN) vaginal cream Place 1 Applicatorful vaginally 3 (three) times a week. Monday, Wednesday, and Friday nights    . digoxin (LANOXIN) 0.125 MG tablet Take 125 mcg by mouth daily.     Marland Kitchen diltiazem (CARDIZEM CD) 120 MG 24 hr capsule Take 120 mg by mouth daily.     . diphenhydramine-acetaminophen (TYLENOL PM) 25-500 MG TABS Take 1 tablet by mouth at bedtime as needed (sleep).    . diphenoxylate-atropine (LOMOTIL) 2.5-0.025 MG per tablet Take 1 tablet by mouth 4 (four) times daily as needed for diarrhea or loose stools. 90 tablet 0  . Fluticasone-Salmeterol (ADVAIR) 250-50 MCG/DOSE AEPB Inhale 1 puff into the lungs every 12 (twelve) hours.     Marland Kitchen levothyroxine (SYNTHROID, LEVOTHROID) 125 MCG tablet Take 250 mcg by mouth daily before breakfast.     . lidocaine-prilocaine (EMLA) cream Apply 1 application topically as needed. Apply to [facility] a Cath site at least one hour prior to needle stick as needed. 30 g 3  . Magnesium Oxide 500 MG TABS Take 500 mg by mouth at bedtime.    . metFORMIN (GLUCOPHAGE-XR) 500 MG 24 hr tablet Take 500 mg by mouth 2 (two) times daily with a meal.    . montelukast (SINGULAIR) 10 MG tablet Take 10 mg by mouth daily.    Marland Kitchen omega-3 acid ethyl esters (LOVAZA) 1 G capsule Take 1 g by mouth 2 (two) times daily.    . ondansetron (ZOFRAN) 8 MG tablet Take 8 mg by mouth every 8 (eight) hours as needed for nausea or vomiting (nausea).     . potassium chloride SA (K-DUR,KLOR-CON) 20 MEQ tablet Take 1 tablet (20 mEq total) by mouth daily. 30 tablet 5  . pravastatin (PRAVACHOL) 40 MG tablet Take 20 mg by mouth See admin instructions. Take 1/2 tablet (20 mg) ONLY on Monday, Tuesday, Wednesday, and Thursday Nights.    . prochlorperazine (COMPAZINE) 10 MG tablet Take 10 mg by mouth every 8 (eight) hours as needed for nausea (nausea).     . traMADol-acetaminophen (ULTRACET) 37.5-325 MG per tablet Take 1 tablet by mouth every 6 (six) hours as needed.  30 tablet 0  . warfarin (COUMADIN) 5 MG tablet Take 2.5-5 mg by mouth daily. Take 1/2 tablet (2.5 mg) on (Sun, Tues, Thurs, Sat) and Take 1 tablet (5 mg) on (Mon, Wed, Fri)     No current facility-administered medications for this visit.   Facility-Administered Medications Ordered in Other Visits  Medication Dose Route Frequency Provider Last Rate Last Dose  . heparin lock flush 100 unit/mL  500 Units Intracatheter Once PRN Heath Lark, MD      . sodium chloride 0.9 % injection 10 mL  10 mL Intracatheter PRN Heath Lark, MD        PHYSICAL EXAMINATION: ECOG PERFORMANCE STATUS: 1 - Symptomatic but completely ambulatory  Filed Vitals:   10/10/14 0845  BP: 140/55  Pulse: 67  Temp: 97.5 F (36.4 C)  Resp: 18   Filed Weights   10/10/14 0845  Weight: 129 lb 14.4 oz (58.922 kg)    GENERAL:alert, no distress and comfortable SKIN: skin color, texture, turgor are normal, no rashes or significant lesions EYES: normal, Conjunctiva are pink and non-injected, sclera clear OROPHARYNX:no exudate, no erythema and lips, buccal mucosa, and tongue normal  NECK: supple,  thyroid normal size, non-tender, without nodularity LYMPH:  no palpable lymphadenopathy in the cervical, axillary or inguinal LUNGS: clear to auscultation and percussion with normal breathing effort HEART: She has a regular rate and rhythm without murmurs. No lower extremity edema. ABDOMEN:abdomen soft, non-tender and normal bowel sounds. She had mild suprapubic discomfort Musculoskeletal:no cyanosis of digits and no clubbing  NEURO: alert & oriented x 3 with fluent speech, no focal motor/sensory deficits  LABORATORY DATA:  I have reviewed the data as listed    Component Value Date/Time   NA 141 10/10/2014 0806   NA 133* 04/12/2014 0529   K 4.2 10/10/2014 0806   K 3.0* 04/12/2014 0529   CL 102 04/12/2014 0529   CO2 25 10/10/2014 0806   CO2 24 04/12/2014 0529   GLUCOSE 142* 10/10/2014 0806   GLUCOSE 141* 04/12/2014 0529    BUN 13.0 10/10/2014 0806   BUN 7 04/12/2014 0529   CREATININE 0.7 10/10/2014 0806   CREATININE 0.51 04/12/2014 0529   CALCIUM 9.3 10/10/2014 0806   CALCIUM 8.4 04/12/2014 0529   PROT 6.1* 10/10/2014 0806   PROT 5.7* 04/12/2014 0529   ALBUMIN 3.9 10/10/2014 0806   ALBUMIN 3.0* 04/12/2014 0529   AST 21 10/10/2014 0806   AST 16 04/12/2014 0529   ALT 23 10/10/2014 0806   ALT 20 04/12/2014 0529   ALKPHOS 68 10/10/2014 0806   ALKPHOS 67 04/12/2014 0529   BILITOT 0.48 10/10/2014 0806   BILITOT 0.6 04/12/2014 0529   GFRNONAA >90 04/12/2014 0529   GFRAA >90 04/12/2014 0529    No results found for: SPEP, UPEP  Lab Results  Component Value Date   WBC 6.8 10/10/2014   NEUTROABS 4.9 10/10/2014   HGB 10.7* 10/10/2014   HCT 32.2* 10/10/2014   MCV 86.8 10/10/2014   PLT 172 10/10/2014      Chemistry      Component Value Date/Time   NA 141 10/10/2014 0806   NA 133* 04/12/2014 0529   K 4.2 10/10/2014 0806   K 3.0* 04/12/2014 0529   CL 102 04/12/2014 0529   CO2 25 10/10/2014 0806   CO2 24 04/12/2014 0529   BUN 13.0 10/10/2014 0806   BUN 7 04/12/2014 0529   CREATININE 0.7 10/10/2014 0806   CREATININE 0.51 04/12/2014 0529      Component Value Date/Time   CALCIUM 9.3 10/10/2014 0806   CALCIUM 8.4 04/12/2014 0529   ALKPHOS 68 10/10/2014 0806   ALKPHOS 67 04/12/2014 0529   AST 21 10/10/2014 0806   AST 16 04/12/2014 0529   ALT 23 10/10/2014 0806   ALT 20 04/12/2014 0529   BILITOT 0.48 10/10/2014 0806   BILITOT 0.6 04/12/2014 0529     ASSESSMENT & PLAN:  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   I am concerned that she have recurrent urosepsis despite being on rituximab only. Recent PET/CT scan in April showed complete response to treatment. We will continue treatment every other month for total of 2 years.    Anemia in chronic illness This is likely due to recent  treatment and anemia of chronic disease. The patient denies recent history of bleeding such as epistaxis, hematuria or hematochezia. She is asymptomatic from the anemia. I will observe for now.       Diarrhea The cause is unknown. She had received intermittent anti-biotic therapy and I wonder about possible anti-biotic associated diarrhea or lactose intolerance. I recommend  dietary modification. If the diarrhea is persistent, I recommend GI evaluation.  Dysuria The patient have recurrent urinary tract infection, last treated 2 months ago. She noted occasional cloudy urine and mild suprapubic discomfort with occasional dysuria. I will recheck urinalysis and urine colter and will call the patient with test results. I would not hold treatment today because of this.   Orders Placed This Encounter  Procedures  . Urine culture    Standing Status: Future     Number of Occurrences: 1     Standing Expiration Date: 11/14/2015  . Urinalysis, Microscopic - [facility]    Standing Status: Future     Number of Occurrences: 1     Standing Expiration Date: 11/14/2015   All questions were answered. The patient knows to call the clinic with any problems, questions or concerns. No barriers to learning was detected. I spent 30 minutes counseling the patient face to face. The total time spent in the appointment was 40 minutes and more than 50% was on counseling and review of test results     [facility], , MD 10/10/2014 10:17 AM

## 2014-10-10 NOTE — Assessment & Plan Note (Signed)
Patient received her first cycle of maintenance Rituxan chemotherapy on 04/03/2014.   I am concerned that she have recurrent urosepsis despite being on rituximab only. Recent PET/CT scan in April showed complete response to treatment. We will continue treatment every other month for total of 2 years.

## 2014-10-10 NOTE — Patient Instructions (Signed)

## 2014-10-10 NOTE — Assessment & Plan Note (Signed)
This is likely due to recent treatment and anemia of chronic disease. The patient denies recent history of bleeding such as epistaxis, hematuria or hematochezia. She is asymptomatic from the anemia. I will observe for now.  

## 2014-10-10 NOTE — Assessment & Plan Note (Signed)
The patient have recurrent urinary tract infection, last treated 2 months ago. She noted occasional cloudy urine and mild suprapubic discomfort with occasional dysuria. I will recheck urinalysis and urine colter and will call the patient with test results. I would not hold treatment today because of this.

## 2014-10-11 ENCOUNTER — Telehealth: Payer: Self-pay | Admitting: *Deleted

## 2014-10-11 NOTE — Telephone Encounter (Signed)
Per staff message and POF I have scheduled appts. Advised scheduler of appts. JMW  

## 2014-10-14 ENCOUNTER — Telehealth: Payer: Self-pay | Admitting: *Deleted

## 2014-10-14 LAB — URINE CULTURE

## 2014-10-14 MED ORDER — CIPROFLOXACIN HCL 250 MG PO TABS: 250.0000 mg | ORAL_TABLET | Freq: Two times a day (BID) | ORAL | Status: DC

## 2014-10-14 NOTE — Telephone Encounter (Signed)
-----   Message from Heath Lark, MD sent at 10/14/2014  9:29 AM EDT ----- Regarding: urine culture It is positive Please call in cipro 250 mg BID PO X 7 days, no refills

## 2014-10-14 NOTE — Telephone Encounter (Signed)
Informed pt of Dr. Calton Dach message below.  Informed Rx for Cipro sent to [facility].  Pt verbalized understanding and says she thinks Cipro might interfere w/ her Coumadin.  Instructed pt to call Her coumadin clinic, Dr. Irven Shelling office and let them know she is going to take Cipro and ask if they need to adjust her coumadin dose or check her INR sooner than Planned?  Pt says she will call their office today.  And she will pick up and start cipro today.

## 2014-11-06 DIAGNOSIS — M810 Age-related osteoporosis without current pathological fracture: Secondary | ICD-10-CM | POA: Diagnosis not present

## 2014-11-12 DIAGNOSIS — Z7901 Long term (current) use of anticoagulants: Secondary | ICD-10-CM | POA: Diagnosis not present

## 2014-11-13 ENCOUNTER — Other Ambulatory Visit: Payer: Self-pay | Admitting: *Deleted

## 2014-11-13 MED ORDER — PROCHLORPERAZINE MALEATE 10 MG PO TABS: 10.0000 mg | ORAL_TABLET | Freq: Four times a day (QID) | ORAL | Status: DC | PRN

## 2014-11-14 DIAGNOSIS — M810 Age-related osteoporosis without current pathological fracture: Secondary | ICD-10-CM | POA: Diagnosis not present

## 2014-11-28 DIAGNOSIS — I10 Essential (primary) hypertension: Secondary | ICD-10-CM | POA: Diagnosis not present

## 2014-11-28 DIAGNOSIS — E119 Type 2 diabetes mellitus without complications: Secondary | ICD-10-CM | POA: Diagnosis not present

## 2014-11-28 DIAGNOSIS — I480 Paroxysmal atrial fibrillation: Secondary | ICD-10-CM | POA: Diagnosis not present

## 2014-11-28 DIAGNOSIS — G4731 Primary central sleep apnea: Secondary | ICD-10-CM | POA: Diagnosis not present

## 2014-12-04 DIAGNOSIS — E7800 Pure hypercholesterolemia, unspecified: Secondary | ICD-10-CM | POA: Diagnosis not present

## 2014-12-06 DIAGNOSIS — R5381 Other malaise: Secondary | ICD-10-CM | POA: Diagnosis not present

## 2014-12-06 DIAGNOSIS — I10 Essential (primary) hypertension: Secondary | ICD-10-CM | POA: Diagnosis not present

## 2014-12-10 DIAGNOSIS — Z7901 Long term (current) use of anticoagulants: Secondary | ICD-10-CM | POA: Diagnosis not present

## 2014-12-12 ENCOUNTER — Ambulatory Visit: Payer: Medicare Other

## 2014-12-12 ENCOUNTER — Ambulatory Visit (HOSPITAL_BASED_OUTPATIENT_CLINIC_OR_DEPARTMENT_OTHER): Payer: Medicare Other

## 2014-12-12 ENCOUNTER — Other Ambulatory Visit (HOSPITAL_BASED_OUTPATIENT_CLINIC_OR_DEPARTMENT_OTHER): Payer: Medicare Other

## 2014-12-12 ENCOUNTER — Telehealth: Payer: Self-pay | Admitting: Hematology and Oncology

## 2014-12-12 ENCOUNTER — Ambulatory Visit (HOSPITAL_BASED_OUTPATIENT_CLINIC_OR_DEPARTMENT_OTHER): Payer: Medicare Other | Admitting: Hematology and Oncology

## 2014-12-12 ENCOUNTER — Encounter: Payer: Self-pay | Admitting: Hematology and Oncology

## 2014-12-12 VITALS — BP 144/71 | HR 85 | Temp 97.8°F | Resp 17 | Ht 66.0 in | Wt 135.1 lb

## 2014-12-12 VITALS — BP 121/53 | HR 79 | Temp 97.1°F | Resp 16

## 2014-12-12 DIAGNOSIS — D638 Anemia in other chronic diseases classified elsewhere: Secondary | ICD-10-CM | POA: Diagnosis not present

## 2014-12-12 DIAGNOSIS — I482 Chronic atrial fibrillation, unspecified: Secondary | ICD-10-CM

## 2014-12-12 DIAGNOSIS — C8591 Non-Hodgkin lymphoma, unspecified, lymph nodes of head, face, and neck: Secondary | ICD-10-CM | POA: Diagnosis not present

## 2014-12-12 DIAGNOSIS — Z95828 Presence of other vascular implants and grafts: Secondary | ICD-10-CM

## 2014-12-12 DIAGNOSIS — C8203 Follicular lymphoma grade I, intra-abdominal lymph nodes: Secondary | ICD-10-CM

## 2014-12-12 DIAGNOSIS — I4891 Unspecified atrial fibrillation: Secondary | ICD-10-CM

## 2014-12-12 DIAGNOSIS — Z5112 Encounter for antineoplastic immunotherapy: Secondary | ICD-10-CM | POA: Diagnosis not present

## 2014-12-12 LAB — CBC WITH DIFFERENTIAL/PLATELET
BASO%: 0.6 % (ref 0.0–2.0)
Basophils Absolute: 0 10*3/uL (ref 0.0–0.1)
EOS%: 2 % (ref 0.0–7.0)
Eosinophils Absolute: 0.1 10*3/uL (ref 0.0–0.5)
HEMATOCRIT: 32 % — AB (ref 34.8–46.6)
HGB: 10.8 g/dL — ABNORMAL LOW (ref 11.6–15.9)
LYMPH%: 16.8 % (ref 14.0–49.7)
MCH: 29.2 pg (ref 25.1–34.0)
MCHC: 33.9 g/dL (ref 31.5–36.0)
MCV: 86.2 fL (ref 79.5–101.0)
MONO#: 0.6 10*3/uL (ref 0.1–0.9)
MONO%: 9.1 % (ref 0.0–14.0)
NEUT%: 71.5 % (ref 38.4–76.8)
NEUTROS ABS: 5 10*3/uL (ref 1.5–6.5)
PLATELETS: 199 10*3/uL (ref 145–400)
RBC: 3.71 10*6/uL (ref 3.70–5.45)
RDW: 15.2 % — ABNORMAL HIGH (ref 11.2–14.5)
WBC: 7 10*3/uL (ref 3.9–10.3)
lymph#: 1.2 10*3/uL (ref 0.9–3.3)

## 2014-12-12 LAB — COMPREHENSIVE METABOLIC PANEL (CC13)
ALK PHOS: 80 U/L (ref 40–150)
ALT: 21 U/L (ref 0–55)
ANION GAP: 9 meq/L (ref 3–11)
AST: 25 U/L (ref 5–34)
Albumin: 4.1 g/dL (ref 3.5–5.0)
BILIRUBIN TOTAL: 0.59 mg/dL (ref 0.20–1.20)
BUN: 13.4 mg/dL (ref 7.0–26.0)
CALCIUM: 9.8 mg/dL (ref 8.4–10.4)
CO2: 24 mEq/L (ref 22–29)
CREATININE: 0.8 mg/dL (ref 0.6–1.1)
Chloride: 108 mEq/L (ref 98–109)
EGFR: 77 mL/min/{1.73_m2} — ABNORMAL LOW (ref >90)
Glucose: 128 mg/dl (ref 70–140)
Potassium: 4.1 mEq/L (ref 3.5–5.1)
Sodium: 140 mEq/L (ref 136–145)
Total Protein: 6.6 g/dL (ref 6.4–8.3)

## 2014-12-12 LAB — TECHNOLOGIST REVIEW

## 2014-12-12 MED ORDER — SODIUM CHLORIDE 0.9 % IV SOLN
375.0000 mg/m2 | Freq: Once | INTRAVENOUS | Status: AC
  Administered 2014-12-12: 600 mg via INTRAVENOUS
  Filled 2014-12-12: qty 60

## 2014-12-12 MED ORDER — DIPHENHYDRAMINE HCL 25 MG PO CAPS
ORAL_CAPSULE | ORAL | Status: AC
  Filled 2014-12-12: qty 2

## 2014-12-12 MED ORDER — SODIUM CHLORIDE 0.9 % IV SOLN
Freq: Once | INTRAVENOUS | Status: AC
  Administered 2014-12-12: 10:00:00 via INTRAVENOUS

## 2014-12-12 MED ORDER — ACETAMINOPHEN 325 MG PO TABS
650.0000 mg | ORAL_TABLET | Freq: Once | ORAL | Status: AC
  Administered 2014-12-12: 650 mg via ORAL

## 2014-12-12 MED ORDER — DIPHENHYDRAMINE HCL 25 MG PO CAPS
50.0000 mg | ORAL_CAPSULE | Freq: Once | ORAL | Status: AC
  Administered 2014-12-12: 50 mg via ORAL

## 2014-12-12 MED ORDER — HEPARIN SOD (PORK) LOCK FLUSH 100 UNIT/ML IV SOLN
500.0000 [IU] | Freq: Once | INTRAVENOUS | Status: AC | PRN
  Administered 2014-12-12: 500 [IU]
  Filled 2014-12-12: qty 5

## 2014-12-12 MED ORDER — SODIUM CHLORIDE 0.9 % IJ SOLN
10.0000 mL | INTRAMUSCULAR | Status: DC | PRN
  Administered 2014-12-12: 10 mL via INTRAVENOUS
  Filled 2014-12-12: qty 10

## 2014-12-12 MED ORDER — SODIUM CHLORIDE 0.9 % IJ SOLN
10.0000 mL | INTRAMUSCULAR | Status: DC | PRN
  Administered 2014-12-12: 10 mL
  Filled 2014-12-12: qty 10

## 2014-12-12 MED ORDER — ACETAMINOPHEN 325 MG PO TABS
ORAL_TABLET | ORAL | Status: AC
  Filled 2014-12-12: qty 2

## 2014-12-12 NOTE — Telephone Encounter (Signed)
Gave and printed appt sched and avs for pt for DEC...gv barium °

## 2014-12-12 NOTE — Progress Notes (Signed)
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   01/03/2014 Bone Marrow Biopsy Accession: [id number] Bone marrow biopsy is negative   01/04/2014 Imaging ECHO showed normal EF   01/04/2014 Procedure She has placement of port   01/09/2014 - 03/07/2014 Chemotherapy She was given treatment with bendamustine with rituximab. Treatment was stopped due to severe side-effects despite significant dose adjustment for cycle 2   01/18/2014 - 02/01/2014 Hospital Admission She was admitted to the hospital from Escherichia coli sepsis with multiorgan failure and brief episodes of intubation. She was discharged to skilled nursing facility   02/26/2014 Imaging PET/CT scan showed near complete remission   02/27/2014 Adverse Reaction Cycle 2 of treatment was resumed with drastic dose adjustment to bendamustine due to recent multi-organ failure   04/03/2014 -  Chemotherapy She is started on maintenance rituximab only.   04/09/2014 - 04/12/2014 Hospital Admission The patient was admitted to the hospital with urinary tract infection and sepsis.   06/05/2014 Imaging PET CT scan showed complete  response to Rx    INTERVAL HISTORY: Please see below for problem oriented charting.  she returns for further follow-up. She denies recent infection. No new lymphadenopathy. The patient denies any recent signs or symptoms of bleeding such as spontaneous epistaxis, hematuria or hematochezia.  REVIEW OF SYSTEMS:   Constitutional: Denies fevers, chills or abnormal weight loss Eyes: Denies blurriness of vision Ears, nose, mouth, throat, and face: Denies mucositis or sore throat Respiratory: Denies cough, dyspnea or wheezes Cardiovascular: Denies palpitation, chest discomfort or lower extremity swelling Gastrointestinal:  Denies nausea, heartburn or change in bowel habits Skin: Denies abnormal skin rashes Lymphatics: Denies new lymphadenopathy or easy bruising Neurological:Denies numbness, tingling or new weaknesses Behavioral/Psych: Mood is stable, no new changes  All other systems were reviewed with the patient and are negative.  I have reviewed the past medical history, past surgical history, social history and family history with the patient and they are unchanged from previous note.  ALLERGIES:  is allergic to morphine and related; demerol; meperidine hcl; multaq; oysters; penicillins; and sulfa drugs cross reactors.  MEDICATIONS:  Current Outpatient Prescriptions  Medication Sig Dispense Refill  . acetaminophen (TYLENOL) 500 MG tablet Take 1,000 mg by mouth every 6 (six) hours as needed for mild pain (pain).     Marland Kitchen albuterol (PROAIR HFA) 108 (90 BASE) MCG/ACT inhaler Inhale 2 puffs into the lungs every 6 (six) hours as needed for wheezing or shortness of breath.    . Cholecalciferol (VITAMIN D) 2000 UNITS tablet Take 2,000 Units by mouth daily with lunch.    . conjugated estrogens (PREMARIN) vaginal cream Place 1 Applicatorful vaginally 3 (three)  times a week. Monday, Wednesday, and Friday nights    . diltiazem (CARDIZEM CD) 120 MG 24 hr capsule Take 120 mg by mouth daily.     .  diphenhydramine-acetaminophen (TYLENOL PM) 25-500 MG TABS Take 1 tablet by mouth at bedtime as needed (sleep).    . diphenoxylate-atropine (LOMOTIL) 2.5-0.025 MG per tablet Take 1 tablet by mouth 4 (four) times daily as needed for diarrhea or loose stools. 90 tablet 0  . Fluticasone-Salmeterol (ADVAIR) 250-50 MCG/DOSE AEPB Inhale 1 puff into the lungs every 12 (twelve) hours.     Marland Kitchen levothyroxine (SYNTHROID, LEVOTHROID) 125 MCG tablet Take 250 mcg by mouth daily before breakfast.     . lidocaine-prilocaine (EMLA) cream Apply 1 application topically as needed. Apply to [facility] a Cath site at least one hour prior to needle stick as needed. 30 g 3  . Magnesium Oxide 500 MG TABS Take 500 mg by mouth at bedtime.    . metFORMIN (GLUCOPHAGE-XR) 500 MG 24 hr tablet Take 500 mg by mouth 2 (two) times daily with a meal.    . montelukast (SINGULAIR) 10 MG tablet Take 10 mg by mouth daily.    . ondansetron (ZOFRAN) 8 MG tablet Take 8 mg by mouth every 8 (eight) hours as needed for nausea or vomiting (nausea).     . potassium chloride SA (K-DUR,KLOR-CON) 20 MEQ tablet Take 1 tablet (20 mEq total) by mouth daily. 30 tablet 5  . pravastatin (PRAVACHOL) 40 MG tablet Take 20 mg by mouth See admin instructions. Take 1/2 tablet (20 mg) ONLY on Monday, Tuesday, Wednesday, and Thursday Nights.    . prochlorperazine (COMPAZINE) 10 MG tablet Take 1 tablet (10 mg total) by mouth every 6 (six) hours as needed for nausea (nausea). 30 tablet 6  . traMADol-acetaminophen (ULTRACET) 37.5-325 MG per tablet Take 1 tablet by mouth every 6 (six) hours as needed. 30 tablet 0  . warfarin (COUMADIN) 5 MG tablet Take 2.5-5 mg by mouth daily. Take 1/2 tablet (2.5 mg) on (Sun, Tues, Thurs, Sat) and Take 1 tablet (5 mg) on (Mon, Wed, Fri)     No current facility-administered medications for this visit.   Facility-Administered Medications Ordered in Other Visits  Medication Dose Route Frequency Provider Last Rate Last Dose  . heparin lock  flush 100 unit/mL  500 Units Intracatheter Once PRN Heath Lark, MD      . sodium chloride 0.9 % injection 10 mL  10 mL Intracatheter PRN Heath Lark, MD        PHYSICAL EXAMINATION: ECOG PERFORMANCE STATUS: 0 - Asymptomatic  Filed Vitals:   12/12/14 0849  BP: 144/71  Pulse: 85  Temp: 97.8 F (36.6 C)  Resp: 17   Filed Weights   12/12/14 0849  Weight: 135 lb 1.6 oz (61.281 kg)    GENERAL:alert, no distress and comfortable SKIN: skin color, texture, turgor are normal, no rashes or significant lesions EYES: normal, Conjunctiva are pink and non-injected, sclera clear OROPHARYNX:no exudate, no erythema and lips, buccal mucosa, and tongue normal  NECK: supple, thyroid normal size, non-tender, without nodularity LYMPH:  no palpable lymphadenopathy in the cervical, axillary or inguinal LUNGS: clear to auscultation and percussion with normal breathing effort HEART: regular rate & rhythm and no murmurs and no lower extremity edema ABDOMEN:abdomen soft, non-tender and normal bowel sounds Musculoskeletal:no cyanosis of digits and no clubbing  NEURO: alert & oriented x 3 with fluent speech, no focal motor/sensory deficits  LABORATORY DATA:  I have reviewed the data as  listed    Component Value Date/Time   NA 140 12/12/2014 0829   NA 133* 04/12/2014 0529   K 4.1 12/12/2014 0829   K 3.0* 04/12/2014 0529   CL 102 04/12/2014 0529   CO2 24 12/12/2014 0829   CO2 24 04/12/2014 0529   GLUCOSE 128 12/12/2014 0829   GLUCOSE 141* 04/12/2014 0529   BUN 13.4 12/12/2014 0829   BUN 7 04/12/2014 0529   CREATININE 0.8 12/12/2014 0829   CREATININE 0.51 04/12/2014 0529   CALCIUM 9.8 12/12/2014 0829   CALCIUM 8.4 04/12/2014 0529   PROT 6.6 12/12/2014 0829   PROT 5.7* 04/12/2014 0529   ALBUMIN 4.1 12/12/2014 0829   ALBUMIN 3.0* 04/12/2014 0529   AST 25 12/12/2014 0829   AST 16 04/12/2014 0529   ALT 21 12/12/2014 0829   ALT 20 04/12/2014 0529   ALKPHOS 80 12/12/2014 0829   ALKPHOS 67  04/12/2014 0529   BILITOT 0.59 12/12/2014 0829   BILITOT 0.6 04/12/2014 0529   GFRNONAA >90 04/12/2014 0529   GFRAA >90 04/12/2014 0529    No results found for: SPEP, UPEP  Lab Results  Component Value Date   WBC 7.0 12/12/2014   NEUTROABS 5.0 12/12/2014   HGB 10.8* 12/12/2014   HCT 32.0* 12/12/2014   MCV 86.2 12/12/2014   PLT 199 12/12/2014      Chemistry      Component Value Date/Time   NA 140 12/12/2014 0829   NA 133* 04/12/2014 0529   K 4.1 12/12/2014 0829   K 3.0* 04/12/2014 0529   CL 102 04/12/2014 0529   CO2 24 12/12/2014 0829   CO2 24 04/12/2014 0529   BUN 13.4 12/12/2014 0829   BUN 7 04/12/2014 0529   CREATININE 0.8 12/12/2014 0829   CREATININE 0.51 04/12/2014 0529      Component Value Date/Time   CALCIUM 9.8 12/12/2014 0829   CALCIUM 8.4 04/12/2014 0529   ALKPHOS 80 12/12/2014 0829   ALKPHOS 67 04/12/2014 0529   AST 25 12/12/2014 0829   AST 16 04/12/2014 0529   ALT 21 12/12/2014 0829   ALT 20 04/12/2014 0529   BILITOT 0.59 12/12/2014 0829   BILITOT 0.6 04/12/2014 0529     ASSESSMENT & PLAN:  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   Recent PET/CT scan in April showed complete response to treatment. We will continue treatment every other month for total of 2 years. Due to high risk of disease relapse, I plan to order another imaging study before I see her back in 2 months.   Anemia in chronic illness This is likely due to recent treatment and anemia of chronic disease. The patient denies recent history of bleeding such as epistaxis, hematuria or hematochezia. She is asymptomatic from the anemia. I will observe for now.       Chronic atrial fibrillation Today, she is rate controlled. She will continue her cardiac medications. She will remain on anticoagulation therapy without bleeding complication.  according to the patient, she will proceed  with DC cardioversion at the end of the months. I will defer to her cardiologist for further management.     Orders Placed This Encounter  Procedures  . CT Chest W Contrast    Standing Status: Future     Number of Occurrences:      Standing Expiration Date: 02/11/2016    Order Specific Question:  Reason for Exam (SYMPTOM  OR  DIAGNOSIS REQUIRED)    Answer:  staging lymphoma, exclude recurrence    Order Specific Question:  Preferred imaging location?    Answer:  [facility]  . CT Abdomen Pelvis W Contrast    Standing Status: Future     Number of Occurrences:      Standing Expiration Date: 03/13/2016    Order Specific Question:  Reason for Exam (SYMPTOM  OR DIAGNOSIS REQUIRED)    Answer:  staging lymphoma, exclude recurrence    Order Specific Question:  Preferred imaging location?    Answer:  [facility]   All questions were answered. The patient knows to call the clinic with any problems, questions or concerns. No barriers to learning was detected. I spent 20 minutes counseling the patient face to face. The total time spent in the appointment was 25 minutes and more than 50% was on counseling and review of test results     [facility], [place], MD 12/12/2014 10:35 AM

## 2014-12-12 NOTE — Patient Instructions (Signed)

## 2014-12-12 NOTE — Patient Instructions (Signed)
[facility] Discharge Instructions for Patients Receiving Chemotherapy  Today you received the following chemotherapy agents rituxan.   To help prevent nausea and vomiting after your treatment, we encourage you to take your nausea medication as directed.     If you develop nausea and vomiting that is not controlled by your nausea medication, call the clinic.   BELOW ARE SYMPTOMS THAT SHOULD BE REPORTED IMMEDIATELY:  *FEVER GREATER THAN 100.5 F  *CHILLS WITH OR WITHOUT FEVER  NAUSEA AND VOMITING THAT IS NOT CONTROLLED WITH YOUR NAUSEA MEDICATION  *UNUSUAL SHORTNESS OF BREATH  *UNUSUAL BRUISING OR BLEEDING  TENDERNESS IN MOUTH AND THROAT WITH OR WITHOUT PRESENCE OF ULCERS  *URINARY PROBLEMS  *BOWEL PROBLEMS  UNUSUAL RASH Items with * indicate a potential emergency and should be followed up as soon as possible.  Feel free to call the clinic you have any questions or concerns. The clinic phone number is [phone].  

## 2014-12-12 NOTE — Assessment & Plan Note (Signed)
Patient received her first cycle of maintenance Rituxan chemotherapy on 04/03/2014.   Recent PET/CT scan in April showed complete response to treatment. We will continue treatment every other month for total of 2 years. Due to high risk of disease relapse, I plan to order another imaging study before I see her back in 2 months.

## 2014-12-12 NOTE — Assessment & Plan Note (Signed)
Today, she is rate controlled. She will continue her cardiac medications. She will remain on anticoagulation therapy without bleeding complication.  according to the patient, she will proceed with DC cardioversion at the end of the months. I will defer to her cardiologist for further management.

## 2014-12-12 NOTE — Assessment & Plan Note (Signed)
This is likely due to recent treatment and anemia of chronic disease. The patient denies recent history of bleeding such as epistaxis, hematuria or hematochezia. She is asymptomatic from the anemia. I will observe for now.  

## 2014-12-19 DIAGNOSIS — Z7901 Long term (current) use of anticoagulants: Secondary | ICD-10-CM | POA: Diagnosis not present

## 2014-12-19 DIAGNOSIS — I480 Paroxysmal atrial fibrillation: Secondary | ICD-10-CM | POA: Diagnosis not present

## 2014-12-23 NOTE — H&P (Signed)
OFFICE VISIT NOTES COPIED TO EPIC FOR DOCUMENTATION  Patty [name] 2014-12-17 9:39 AM Location: [facility] Patient #: [id number] DOB: Sep 26, 1940 Married / Language: English / Race: White Female   History of Present Illness Patty Page MD; Dec 17, 2014 6:13 PM) The patient is a 74 year old female who presents with atrial fibrillation. She has history of parosysmal atrial fibrillation, diabetes mellitus, history of bronchial asthma and central sleep apnea, uses CPAP on a regular basis. She now presents here for anual follow-up of atrial fibrillation.  She also has hyperlipidemia and well-controlled diabetes and has been on Coumadin and is tolerating the Coumadin without any bleeding diathesis.  Patient states that 2 weeks ago she started noticing worsening dyspnea and also fatigue. She felt poorly for one week and gradually started to feel slightly better. Previously she had complained of chronic dyspnea but that had been stable. This is a new symptom that started recently. She denies any PND or orthopnea. She denies any chest pain or palpitations. No visual disturbances, nausea or vomiting. No significant weight changes recently. No dark stools or bloody stools.   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 Fibrillation. Stable. 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 Bleeding risk is 1 %/year. Rec: [place] Heart cath 2010 Mild coronary calcification, Normal EF. Stress cardiolite 3/12 [facility]) no  ischemia . Admitted to [facility] in March 2012 for A. Fibrillation with RVR. Echo 04/08/11: Normal LVEF. Moderate LVH. Moderate LA enlargement at 4.5cm. Non Hodgkin's lymphoma (C85.90) Hypothyroidism, adult (E03.9)  Allergies (Charavina Reader; 12/17/2014 9:42 AM) Demerol *ANALGESICS - OPIOID* Vomiting. Penicillin V *PENICILLINS* Rash. SulfADIAZINE *SULFONAMIDES* Rash. Multaq *ANTIARRHYTHMICS* Rash. Penicillamine *ASSORTED CLASSES*  Family History (Bartley Reader; 12/17/14 9:48 AM) Mother Deceased. at age 2, from pneumonia; known CHF, h/o of MI at age 78. Father Deceased. at age 78, from a Stroke; h/o MI at age 78, HTN Brother 1 Deceased. at age 33 from MI; no other heart attacks or strokes, no other cardiovascular conditions; 10 yrs older Sister 1 Deceased. at age 56 from cancer; stents and a pacemaker; no strokes or heart attacks, no other cardiovascular conditions; 6 yrs older  Social History Franky Macho Reader; 2014-12-17 9:48 AM) Current tobacco use Never smoker. Non Drinker/No Alcohol Use Marital status Married. Number of Children 2. Living Situation Lives with spouse.  Past Surgical History Franky Macho Reader; 12/17/2014 9:42 AM) Tonsillectomy 1950. Appendectomy 1953 Hysterectomy in the 70's.  2 back surgeries in 1984 and 1986.  Cholecystectomy 1998.  Cataract surgery in both eyes 1992.  Medication History (Charavina Reader; 12/17/2014 9:53 AM) Warfarin Sodium (5MG  Tablet, 1 (one) Tablet Oral T,Th,Sat,Sun; 2.5mg  All Other Days, Taken starting 11/12/2014) Active. Digoxin (125MCG Tablet, 1 (one) Tablet Tablet Oral daily, Taken starting 10/16/2014) Active. Diltiazem HCl ER (120MG  Capsule ER 24HR, 1 (one) Capsule ER 24HR Capsul Oral daily, Taken starting 10/03/2014) Active. Klor-Con M20 [facility] Tablet ER, 1 Tablet ER Oral daily, Taken starting 09/25/2014) Active. Synthroid (125MCG Tablet, 2 Oral daily) Active. Singulair (10MG  Tablet, 1 Oral daily)  Active. Advair Diskus (250-50MCG/DOSE Aero Pow Br Act, 2 puffs Inhalation daily) Active. Lovaza (1GM Capsule, 2 Oral daily) Active. Vitamin D3 (2000UNIT Capsule,  1 Oral daily) Active. Hydrochlorothiazide (25MG  Tablet, Oral as needed) Active. Tramadol-Acetaminophen (37.5-325MG  Tablet, Oral as needed) Active. Magnesium Oxide (500MG  Tablet, 1 Oral daily) Active. MetFORMIN HCl ER (500MG  Tablet ER 24HR, 1 Oral two times daily, with food) Active. Pravastatin Sodium (40MG  Tablet, 1/2 tablet Oral four days a week) Active. Acyclovir (400MG  Tablet, 1 Oral daily) Active. Allopurinol (300MG  Tablet, 1 Oral daily) Active. Biotene Dry Mouth (Mouth/Throat daily after eating) Active. Premarin (0.625MG /GM Cream, apply 3 times Vaginal weekly) Active. Diphenoxylate-Atropine (2.5-0.025MG  Tablet, 1 Oral as needed for diarrhea) Active. Zofran ODT (8MG  Tablet Disperse, 1 Oral every 8 hours as needed for nausea) Active. ProAir HFA (108 (90 Base)MCG/ACT Aerosol Soln, 2 puffs Inhalation every 6 hours as needed) Active. Compazine (10MG  Tablet, 1 Oral every 6 hours as needed for nausea) Active. Medications Reconciled  Diagnostic Studies History Franky Macho Reader; 11/28/2014 9:42 AM) Colonoscopy 2010 (normal).  Echo 04/08/11: Normal LVEF. Moderate LVH. Moderate LA enlargement at 4.5cm. ECG 11/18/11: Sinus, 1st degree AV block, RBBB. Low voltage. Inferior ST-T change, cannot R/O ischemi Cardioversion 04/13/11 Sleep study 2004 (Dx'd with sleep apnea-sleeps with CPAP every night).   Other Problems Neldon Labella, AGNP-C; 11/28/2014 10:20 AM) Heart cath 2010 (no stents-Dr. Peter Martinique) Mild coronary calcification, Normal EF. Admitted to [place] in March 2012 for A. Fibrillation with RVR. Stress cardiolite 3/12 [facility]) no ischemia . Echo 2012 mild LVH. Normal EF.     Review of Systems Patty Page MD; 11/28/2014 10:42 AM) General Present- Fatigue (new 2 wweeks). Not Present-  Fever and Night Sweats. Respiratory Present- Difficulty Breathing on Exertion (chronic and worse 2 weeks). Not Present- Chronic Cough, Hemoptysis and Wakes up from Sleep Wheezing or Short of Breath. Cardiovascular Not Present- Chest Pain, Claudications, Fainting, Orthopnea, Paroxysmal Nocturnal Dyspnea and Swelling of Extremities. Gastrointestinal Not Present- Abdominal Pain, Constipation, Diarrhea, Nausea and Vomiting. Musculoskeletal Present- Joint Pain (knee and ankle) and Joint Stiffness. Neurological Not Present- Dizziness, Focal Neurological Symptoms, Headaches and Syncope. Hematology Not Present- Blood Clots, Easy Bruising and Nose Bleed. All other systems negative  Vitals (Bridgette Allison AGNP-C; 11/28/2014 10:18 AM) 11/28/2014 9:53 AM Weight: 133 lb Height: 66.5in Body Surface Area: 1.69 m Body Mass Index: 21.14 kg/m  Pulse: 70 (Regular)  P.OX: 98% (Room air) BP: 134/60 (Sitting, Left Arm, Standard)       Physical Exam Patty Page MD; 11/28/2014 6:13 PM) General Mental Status-Alert. General Appearance-Cooperative, Appears stated age, Not in acute distress. Orientation-Oriented X3. Build & Nutrition-Well nourished and Moderately built.  Head and Neck Thyroid Gland Characteristics - no palpable nodules, no palpable enlargement.  Chest and Lung Exam Chest and lung exam reveals -normal excursion with symmetric chest walls, quiet, even and easy respiratory effort with no use of accessory muscles and on auscultation, normal breath sounds, no adventitious sounds and normal vocal resonance. Palpation Tender - No chest wall tenderness.  Cardiovascular Cardiovascular examination reveals -carotid auscultation reveals no bruits, abdominal aorta auscultation reveals no bruits, femoral artery auscultation bilaterally reveals normal pulses, no bruits, no thrills, normal pedal pulses bilaterally and no digital clubbing, cyanosis, edema, increased warmth or  tenderness. Inspection Jugular vein - Right - No Distention. Auscultation Heart Sounds - S1 WNL, S2 WNL and No gallop present. Murmurs & Other Heart Sounds: Murmur - Location - Aortic Area and Apex. Timing - Early systolic. Grade - I/VI.  Abdomen Palpation/Percussion Palpation and Percussion of the abdomen reveal - Non Tender and No hepatosplenomegaly. Auscultation Auscultation of the abdomen reveals - Bowel sounds normal.  Neurologic Motor-Grossly intact without any focal deficits.  Musculoskeletal Global Assessment Left Lower Extremity - normal range of motion without pain. Right Lower Extremity - normal range of motion without pain.    Assessment & Plan (Bridgette Ebony Hail AGNP-C; 11/28/2014 12:48 PM) Paroxysmal atrial fibrillation (I48.0) Story: CHA2DS2-VASc Score is 5 with yearly risk of stroke of 6.7 %. Bleeding risk is 1 %/year. Rec: [place]   Heart cath 2010 Mild coronary calcification, Normal EF.  Stress cardiolite 3/12 [facility]) no ischemia . Admitted to [place] in March 2012 for A. Fibrillation with RVR.  Echo 04/08/11: Normal LVEF. Moderate LVH. Moderate LA enlargement at 4.5cm. 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 Nonspecific T abnormality.  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 Nonspecific T abnormality No significant change from 11/16/2012 Current Plans Complete electrocardiogram (93000) Discontinued Digoxin 125MCG (Start flecainide). Started Flecainide Acetate 50MG , 1 (one) Tablet two times daily, #60, 11/28/2014, Ref. x1. Local Order: Discontinue Digoxin Future Plans 42/39/5320: METABOLIC PANEL, BASIC (23343) - one time Central sleep apnea (G47.31) Story: Uses CPAP, followed by Dr. Shelia Media Controlled type 2 diabetes mellitus without complication, without  long-term current use of insulin (E11.9) Essential hypertension, benign (I10) Current Plans Mechanism of underlying disease process and action of medications discussed with the patient. I discussed primary/secondary prevention and also dietary counceling was done. Patient presenting here for annual visit and follow-up of paroxysmal atrial fibrillation, she had maintained sinus rhythm since 2014, 2 weeks ago she noticed shortness of breath and fatigue, day she is in atrial flutter with 4-1 conduction, suspect her symptoms are related to this. I had a long discussion regarding rate control versus rhythm control, due to her symptomatic state, I will start the patient on flecainide 50 g by mouth twice a day and set her up for cardioversion. I have discussed regarding risks benefits rate control vs rhythm control with the patient. Patient understands cardiac arrest and need for CPR, aspiration pneumonia, but not limited to these. Patient is willing. Office visit after the cardioversion. With regard to sleep apnea, she's been compliant with CPAP. Labs from PCP reviewed, diabetes and lipids are well controlled. Hypertension is also controlled.   Signed by Patty Page, MD (11/28/2014 6:14 PM)

## 2014-12-24 ENCOUNTER — Ambulatory Visit (HOSPITAL_COMMUNITY)
Admission: RE | Admit: 2014-12-24 | Discharge: 2014-12-24 | Disposition: A | Discharge status: Home / Self Care | Payer: Medicare Other | Source: Ambulatory Visit | Attending: Cardiology | Admitting: Cardiology

## 2014-12-24 ENCOUNTER — Encounter (HOSPITAL_COMMUNITY): Payer: Self-pay | Admitting: Anesthesiology

## 2014-12-24 ENCOUNTER — Encounter (HOSPITAL_COMMUNITY)
Admission: RE | Disposition: A | Discharge status: Home / Self Care | Payer: Self-pay | Source: Ambulatory Visit | Attending: Cardiology

## 2014-12-24 DIAGNOSIS — E039 Hypothyroidism, unspecified: Secondary | ICD-10-CM | POA: Diagnosis not present

## 2014-12-24 DIAGNOSIS — I10 Essential (primary) hypertension: Secondary | ICD-10-CM | POA: Diagnosis not present

## 2014-12-24 DIAGNOSIS — I480 Paroxysmal atrial fibrillation: Secondary | ICD-10-CM | POA: Insufficient documentation

## 2014-12-24 DIAGNOSIS — E119 Type 2 diabetes mellitus without complications: Secondary | ICD-10-CM | POA: Insufficient documentation

## 2014-12-24 DIAGNOSIS — G4733 Obstructive sleep apnea (adult) (pediatric): Secondary | ICD-10-CM | POA: Diagnosis not present

## 2014-12-24 DIAGNOSIS — I4892 Unspecified atrial flutter: Secondary | ICD-10-CM | POA: Diagnosis not present

## 2014-12-24 LAB — GLUCOSE, CAPILLARY: GLUCOSE-CAPILLARY: 98 mg/dL (ref 65–99)

## 2014-12-24 SURGERY — CANCELLED PROCEDURE

## 2014-12-24 MED ORDER — METOPROLOL TARTRATE 1 MG/ML IV SOLN
5.0000 mg | Freq: Once | INTRAVENOUS | Status: AC
  Administered 2014-12-24: 5 mg via INTRAVENOUS

## 2014-12-24 MED ORDER — METOPROLOL TARTRATE 1 MG/ML IV SOLN
INTRAVENOUS | Status: AC
  Filled 2014-12-24: qty 5

## 2014-12-24 MED ORDER — SODIUM CHLORIDE 0.9 % IV SOLN
INTRAVENOUS | Status: DC
  Administered 2014-12-24: 12:00:00 via INTRAVENOUS

## 2015-01-02 DIAGNOSIS — Z7901 Long term (current) use of anticoagulants: Secondary | ICD-10-CM | POA: Diagnosis not present

## 2015-01-02 DIAGNOSIS — I10 Essential (primary) hypertension: Secondary | ICD-10-CM | POA: Diagnosis not present

## 2015-01-02 DIAGNOSIS — I480 Paroxysmal atrial fibrillation: Secondary | ICD-10-CM | POA: Diagnosis not present

## 2015-01-13 ENCOUNTER — Encounter: Payer: Self-pay | Admitting: Gynecology

## 2015-01-13 ENCOUNTER — Ambulatory Visit (INDEPENDENT_AMBULATORY_CARE_PROVIDER_SITE_OTHER): Payer: Medicare Other | Admitting: Gynecology

## 2015-01-13 VITALS — BP 136/70 | Ht 66.0 in | Wt 133.0 lb

## 2015-01-13 DIAGNOSIS — Z79890 Hormone replacement therapy (postmenopausal): Secondary | ICD-10-CM | POA: Diagnosis not present

## 2015-01-13 DIAGNOSIS — N952 Postmenopausal atrophic vaginitis: Secondary | ICD-10-CM | POA: Diagnosis not present

## 2015-01-13 DIAGNOSIS — Z01419 Encounter for gynecological examination (general) (routine) without abnormal findings: Secondary | ICD-10-CM | POA: Diagnosis not present

## 2015-01-13 MED ORDER — ESTROGENS, CONJUGATED 0.625 MG/GM VA CREA: 1.0000 | TOPICAL_CREAM | VAGINAL | Status: DC

## 2015-01-13 NOTE — Progress Notes (Signed)
Patty [name] January 18, 1941 [id number]   History:    74 y.o.  for annual gyn exam with no complaints today. She is being followed by her oncologist due to the fact that she had been diagnosed and several years ago with non-Hodgkin's lymphoma stage III. Patient is also been followed by the urologist Dr. Irine Seal because of past history of recurrent UTIs. She uses vaginal estrogen cream 3 times a week for her severe vaginal atrophy.Her PCP is Dr. Shelia Media please see past medical history and medication list in chart for details. Patient in 16 had a total abdominal hysterectomy as a result of fibroid uterus along with a pubovaginal sling in 1998. Patient denies any stress urinary incontinence..Patient has history of colon polyp and is due for a colonoscopy next year. Patient with no prior history of abnormal Pap smear. Patient is receiving her bone density study for at her PCP office.  Past medical history,surgical history, family history and social history were all reviewed and documented in the EPIC chart.  Gynecologic History No LMP recorded. Patient has had a hysterectomy. Contraception: post menopausal status Last Pap: 2010. Results were: normal Last mammogram: 2016. Results were: normal  Obstetric History OB History  Gravida Para Term Preterm AB SAB TAB Ectopic Multiple Living  2 2 2       2     # Outcome Date GA Lbr Len/2nd Weight Sex Delivery Anes PTL Lv  2 Term           1 Term                ROS: A ROS was performed and pertinent positives and negatives are included in the history.  GENERAL: No fevers or chills. HEENT: No change in vision, no earache, sore throat or sinus congestion. NECK: No pain or stiffness. CARDIOVASCULAR: No chest pain or pressure. No palpitations. PULMONARY: No shortness of breath, cough or wheeze. GASTROINTESTINAL: No abdominal pain, nausea, vomiting or diarrhea, melena or bright red blood per rectum. GENITOURINARY: No urinary frequency, urgency, hesitancy  or dysuria. MUSCULOSKELETAL: No joint or muscle pain, no back pain, no recent trauma. DERMATOLOGIC: No rash, no itching, no lesions. ENDOCRINE: No polyuria, polydipsia, no heat or cold intolerance. No recent change in weight. HEMATOLOGICAL: No anemia or easy bruising or bleeding. NEUROLOGIC: No headache, seizures, numbness, tingling or weakness. PSYCHIATRIC: No depression, no loss of interest in normal activity or change in sleep pattern.     Exam: chaperone present  BP 136/70 mmHg  Ht 5\' 6"  (1.676 m)  Wt 133 lb (60.328 kg)  BMI 21.48 kg/m2  Body mass index is 21.48 kg/(m^2).  General appearance : Well developed well nourished female. No acute distress HEENT: Eyes: no retinal hemorrhage or exudates,  Neck supple, trachea midline, no carotid bruits, no thyroidmegaly Lungs: Clear to auscultation, no rhonchi or wheezes, or rib retractions  Heart: Regular rate and rhythm, no murmurs or gallops Breast:Examined in sitting and supine position were symmetrical in appearance, no palpable masses or tenderness,  no skin retraction, no nipple inversion, no nipple discharge, no skin discoloration, no axillary or supraclavicular lymphadenopathy Abdomen: no palpable masses or tenderness, no rebound or guarding Extremities: no edema or skin discoloration or tenderness  Pelvic:  Bartholin, Urethra, Skene Glands: Within normal limits             Vagina: No gross lesions or discharge, atrophic changes  Cervix: Absent  Uterus  absent  Adnexa  Without masses or tenderness  Anus and  perineum  normal   Rectovaginal  normal sphincter tone without palpated masses or tenderness             Hemoccult PCP provides     Assessment/Plan:  74 y.o. female for annual exam who has done well with Premarin vaginal cream twice a week for vaginal atrophy and vulvar irritation. Her mammogram is up-to-date. She is due for colonoscopy next year. PCP doing her blood work. She will continue to follow with her oncologist for  her chemotherapy for stage III non-Hodgkin's lymphoma. Patient's vaccines are all up-to-date   Terrance Mass MD, 11:20 AM 01/13/2015

## 2015-01-23 ENCOUNTER — Emergency Department (HOSPITAL_COMMUNITY)
Admission: EM | Admit: 2015-01-23 | Discharge: 2015-01-24 | Disposition: A | Discharge status: Home / Self Care | Payer: Medicare Other | Attending: Emergency Medicine | Admitting: Emergency Medicine

## 2015-01-23 ENCOUNTER — Emergency Department (HOSPITAL_COMMUNITY): Payer: Medicare Other

## 2015-01-23 ENCOUNTER — Encounter (HOSPITAL_COMMUNITY): Payer: Self-pay | Admitting: Emergency Medicine

## 2015-01-23 DIAGNOSIS — Z86018 Personal history of other benign neoplasm: Secondary | ICD-10-CM | POA: Insufficient documentation

## 2015-01-23 DIAGNOSIS — Z8742 Personal history of other diseases of the female genital tract: Secondary | ICD-10-CM | POA: Diagnosis not present

## 2015-01-23 DIAGNOSIS — E039 Hypothyroidism, unspecified: Secondary | ICD-10-CM | POA: Diagnosis not present

## 2015-01-23 DIAGNOSIS — M47819 Spondylosis without myelopathy or radiculopathy, site unspecified: Secondary | ICD-10-CM | POA: Insufficient documentation

## 2015-01-23 DIAGNOSIS — R112 Nausea with vomiting, unspecified: Secondary | ICD-10-CM | POA: Diagnosis not present

## 2015-01-23 DIAGNOSIS — Z8579 Personal history of other malignant neoplasms of lymphoid, hematopoietic and related tissues: Secondary | ICD-10-CM

## 2015-01-23 DIAGNOSIS — Z8619 Personal history of other infectious and parasitic diseases: Secondary | ICD-10-CM | POA: Insufficient documentation

## 2015-01-23 DIAGNOSIS — Z79899 Other long term (current) drug therapy: Secondary | ICD-10-CM | POA: Insufficient documentation

## 2015-01-23 DIAGNOSIS — I10 Essential (primary) hypertension: Secondary | ICD-10-CM | POA: Insufficient documentation

## 2015-01-23 DIAGNOSIS — E669 Obesity, unspecified: Secondary | ICD-10-CM | POA: Insufficient documentation

## 2015-01-23 DIAGNOSIS — R531 Weakness: Secondary | ICD-10-CM | POA: Diagnosis not present

## 2015-01-23 DIAGNOSIS — D649 Anemia, unspecified: Secondary | ICD-10-CM | POA: Diagnosis not present

## 2015-01-23 DIAGNOSIS — Z880 Allergy status to penicillin: Secondary | ICD-10-CM | POA: Diagnosis not present

## 2015-01-23 DIAGNOSIS — R509 Fever, unspecified: Secondary | ICD-10-CM | POA: Diagnosis not present

## 2015-01-23 DIAGNOSIS — J45909 Unspecified asthma, uncomplicated: Secondary | ICD-10-CM | POA: Diagnosis not present

## 2015-01-23 DIAGNOSIS — Z9889 Other specified postprocedural states: Secondary | ICD-10-CM | POA: Insufficient documentation

## 2015-01-23 DIAGNOSIS — Z9981 Dependence on supplemental oxygen: Secondary | ICD-10-CM | POA: Diagnosis not present

## 2015-01-23 DIAGNOSIS — I4891 Unspecified atrial fibrillation: Secondary | ICD-10-CM | POA: Diagnosis not present

## 2015-01-23 DIAGNOSIS — R404 Transient alteration of awareness: Secondary | ICD-10-CM | POA: Diagnosis not present

## 2015-01-23 DIAGNOSIS — Z8572 Personal history of non-Hodgkin lymphomas: Secondary | ICD-10-CM | POA: Diagnosis not present

## 2015-01-23 DIAGNOSIS — Z8669 Personal history of other diseases of the nervous system and sense organs: Secondary | ICD-10-CM | POA: Insufficient documentation

## 2015-01-23 DIAGNOSIS — E785 Hyperlipidemia, unspecified: Secondary | ICD-10-CM | POA: Insufficient documentation

## 2015-01-23 DIAGNOSIS — Z8719 Personal history of other diseases of the digestive system: Secondary | ICD-10-CM | POA: Insufficient documentation

## 2015-01-23 DIAGNOSIS — Z7901 Long term (current) use of anticoagulants: Secondary | ICD-10-CM | POA: Insufficient documentation

## 2015-01-23 DIAGNOSIS — G4730 Sleep apnea, unspecified: Secondary | ICD-10-CM | POA: Insufficient documentation

## 2015-01-23 DIAGNOSIS — Z86010 Personal history of colonic polyps: Secondary | ICD-10-CM | POA: Diagnosis not present

## 2015-01-23 DIAGNOSIS — R05 Cough: Secondary | ICD-10-CM | POA: Diagnosis not present

## 2015-01-23 DIAGNOSIS — Z7951 Long term (current) use of inhaled steroids: Secondary | ICD-10-CM | POA: Diagnosis not present

## 2015-01-23 LAB — COMPREHENSIVE METABOLIC PANEL
ALBUMIN: 3.9 g/dL (ref 3.5–5.0)
ALT: 16 U/L (ref 14–54)
AST: 24 U/L (ref 15–41)
Alkaline Phosphatase: 74 U/L (ref 38–126)
Anion gap: 9 (ref 5–15)
BUN: 18 mg/dL (ref 6–20)
CHLORIDE: 104 mmol/L (ref 101–111)
CO2: 24 mmol/L (ref 22–32)
CREATININE: 0.97 mg/dL (ref 0.44–1.00)
Calcium: 9.4 mg/dL (ref 8.9–10.3)
GFR calc non Af Amer: 55 mL/min — ABNORMAL LOW (ref >60)
Glucose, Bld: 172 mg/dL — ABNORMAL HIGH (ref 65–99)
Potassium: 4.4 mmol/L (ref 3.5–5.1)
SODIUM: 137 mmol/L (ref 135–145)
Total Bilirubin: 0.5 mg/dL (ref 0.3–1.2)
Total Protein: 6 g/dL — ABNORMAL LOW (ref 6.5–8.1)

## 2015-01-23 LAB — CBC WITH DIFFERENTIAL/PLATELET
Basophils Absolute: 0 10*3/uL (ref 0.0–0.1)
Basophils Relative: 0 %
EOS ABS: 0 10*3/uL (ref 0.0–0.7)
Eosinophils Relative: 0 %
HCT: 32.3 % — ABNORMAL LOW (ref 36.0–46.0)
HEMOGLOBIN: 10.5 g/dL — AB (ref 12.0–15.0)
LYMPHS ABS: 1 10*3/uL (ref 0.7–4.0)
Lymphocytes Relative: 6 %
MCH: 29 pg (ref 26.0–34.0)
MCHC: 32.5 g/dL (ref 30.0–36.0)
MCV: 89.2 fL (ref 78.0–100.0)
MONOS PCT: 5 %
Monocytes Absolute: 0.8 10*3/uL (ref 0.1–1.0)
NEUTROS PCT: 89 %
Neutro Abs: 14.5 10*3/uL — ABNORMAL HIGH (ref 1.7–7.7)
Platelets: 220 10*3/uL (ref 150–400)
RBC: 3.62 MIL/uL — ABNORMAL LOW (ref 3.87–5.11)
RDW: 14.4 % (ref 11.5–15.5)
WBC: 16.3 10*3/uL — ABNORMAL HIGH (ref 4.0–10.5)

## 2015-01-23 LAB — I-STAT TROPONIN, ED: Troponin i, poc: 0.02 ng/mL (ref 0.00–0.08)

## 2015-01-23 LAB — PROTIME-INR
INR: 2.16 — ABNORMAL HIGH (ref 0.00–1.49)
PROTHROMBIN TIME: 23.9 s — AB (ref 11.6–15.2)

## 2015-01-23 MED ORDER — ONDANSETRON HCL 4 MG/2ML IJ SOLN
4.0000 mg | Freq: Once | INTRAMUSCULAR | Status: AC
  Administered 2015-01-23: 4 mg via INTRAVENOUS
  Filled 2015-01-23: qty 2

## 2015-01-23 MED ORDER — SODIUM CHLORIDE 0.9 % IV BOLUS (SEPSIS)
500.0000 mL | Freq: Once | INTRAVENOUS | Status: AC
  Administered 2015-01-23: 500 mL via INTRAVENOUS

## 2015-01-23 NOTE — ED Provider Notes (Signed)
CSN: [id number]     Arrival date & time 01/23/15  2132 History   First MD Initiated Contact with Patient 01/23/15 2150     Chief Complaint  Patient presents with  . Weakness     (Consider location/radiation/quality/duration/timing/severity/associated sxs/prior Treatment) HPI Comments: Patient is a 74 year old female with history of lymphoma status post chemotherapy and radiation, atrial fibrillation, diabetes, hypertension. She presents today for evaluation of weakness, nausea, and vomiting. She started feeling chilled proximately 5 hours ago and shortly thereafter began to vomit. She denies any diarrhea. She denies any bloody vomit or stool. She denies any fevers. She denies any ill contacts.  Patient is a 74 y.o. female presenting with weakness. The history is provided by the patient.  Weakness This is a new problem. Episode onset: 4 hours ago. The problem occurs constantly. The problem has been gradually worsening. Pertinent negatives include no chest pain and no abdominal pain. Nothing aggravates the symptoms. Nothing relieves the symptoms. She has tried nothing for the symptoms. The treatment provided no relief.    Past Medical History  Diagnosis Date  . Asthma 06/15/2010  . Hypothyroidism 06/15/2010  . Hyperlipemia 06/15/2010  . Atrial fibrillation ([place]) 05/28/2010    Managed with rate control and coumadin  . Long-term (current) use of anticoagulants   . ACE-inhibitor cough   . DI (detrusor instability)   . Fibroid   . Rectocele   . Atrophic vaginitis   . Cancer [facility]) 2008    Colon polyp-early adenoCA  . Diabetes mellitus     Type 2  . Peripheral neuropathy ([place])   . Hypertension   . Dysrhythmia     cardioversion - 2012  . Complication of anesthesia 1980's    post anesth.- states she "went into resp. arrest" , but then remarked that she thought maybe they gave her too much medicine (morphine)   . Shortness of breath     sometimes   . 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  Anemia   . 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 CPAP (continuous positive airway pressure) dependence   . GERD (gastroesophageal reflux disease)   . Hearing loss   . Sleep apnea     uses CPAP everynight- last study in [place] 5 yrs. or more   . Severe sepsis with septic shock ([place]) 01/21/2014  . E coli bacteremia 01/20/2014  . 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 Appendectomy  1953  . Partial hysterectomy  1972  . Cholecystectomy  1993  . Back surgery      x2  . Cataract extraction    . Cardiac catheterization  10/10/2008    Nonobstructive CAD  . Abdominal hysterectomy  1972    TAH  . Foot surgery    . Tonsillectomy    . Pubovag repair with sling    . Skin tag removal  2012    leg  . Cardioversion  04/13/2011    Procedure: CARDIOVERSION;  Surgeon: Laverda Page, MD;  Location: [place];  Service: Cardiovascular;  Laterality: N/A;  . Eye surgery      /w IOL, post cataracts removed   . Mass biopsy Left 12/07/2013    Procedure: EXCISIONAL BIOPSY LEFT NECK MASS ;  Surgeon: Jerrell Belfast, MD;  Location: [facility] OR;  Service: ENT;  Laterality: Left;   Family History  Problem Relation Age of Onset  . Stroke Father  at 101  . Heart attack Father     at 45  . Hypertension Father   . Heart disease Mother   . Arthritis Mother   . Breast cancer Mother     Age 103  . Heart failure Mother   . Heart attack Brother   . Hypertension Brother   . Diabetes Sister    Social History  Substance Use Topics  . Smoking status: Never Smoker   . Smokeless tobacco: Never Used  . Alcohol Use: No   OB History    Gravida Para Term Preterm AB TAB SAB Ectopic Multiple Living   2 2 2       2      Review of Systems  Cardiovascular: Negative for chest pain.  Gastrointestinal: Negative for abdominal pain.  Neurological: Positive for weakness.  All other systems reviewed and are  negative.     Allergies  Morphine and related; Multaq; Demerol; Oysters; Penicillins; and Sulfa drugs cross reactors  Home Medications   Prior to Admission medications   Medication Sig Start Date End Date Taking? Authorizing Provider  albuterol (PROAIR HFA) 108 (90 BASE) MCG/ACT inhaler Inhale 2 puffs into the lungs every 6 (six) hours as needed for wheezing or shortness of breath.   Yes Historical Provider, MD  Cholecalciferol (VITAMIN D) 2000 UNITS tablet Take 2,000 Units by mouth daily with lunch.   Yes Historical Provider, MD  conjugated estrogens (PREMARIN) vaginal cream Place 1 Applicatorful vaginally 3 (three) times a week. Monday, Wednesday, and Friday nights 01/13/15  Yes Terrance Mass, MD  diltiazem (CARDIZEM CD) 120 MG 24 hr capsule Take 120 mg by mouth daily.  06/07/11  Yes Peter M Martinique, MD  flecainide (TAMBOCOR) 100 MG tablet Take 50 mg by mouth 2 (two) times daily.   Yes Historical Provider, MD  Fluticasone-Salmeterol (ADVAIR) 250-50 MCG/DOSE AEPB Inhale 2 puffs into the lungs every 12 (twelve) hours.    Yes Historical Provider, MD  levothyroxine (SYNTHROID, LEVOTHROID) 125 MCG tablet Take 250 mcg by mouth daily before breakfast.    Yes Historical Provider, MD  Magnesium Oxide 500 MG TABS Take 500 mg by mouth at bedtime.   Yes Historical Provider, MD  metFORMIN (GLUCOPHAGE-XR) 500 MG 24 hr tablet Take 500 mg by mouth 2 (two) times daily with a meal.   Yes Historical Provider, MD  montelukast (SINGULAIR) 10 MG tablet Take 10 mg by mouth daily.   Yes Historical Provider, MD  potassium chloride SA (K-DUR,KLOR-CON) 20 MEQ tablet Take 1 tablet (20 mEq total) by mouth daily. 12/22/11  Yes Peter M Martinique, MD  pravastatin (PRAVACHOL) 40 MG tablet Take 20 mg by mouth See admin instructions. Take 1/2 tablet (20 mg) ONLY on Monday, Tuesday, Wednesday, and Thursday Nights.   Yes Historical Provider, MD  traMADol-acetaminophen (ULTRACET) 37.5-325 MG per tablet Take 1 tablet by mouth every  6 (six) hours as needed. Patient taking differently: Take 1 tablet by mouth every 6 (six) hours as needed for moderate pain.  02/01/14  Yes Venetia Maxon Rama, MD  triamterene-hydrochlorothiazide (MAXZIDE-25) 37.5-25 MG tablet Take 1 tablet by mouth daily as needed (for edema).    Yes Historical Provider, MD  vitamin B-12 (CYANOCOBALAMIN) 500 MCG tablet Take 500 mcg by mouth daily.   Yes Historical Provider, MD  warfarin (COUMADIN) 5 MG tablet Take 2.5-5 mg by mouth daily at 6 PM. Takes 5mg  on Tue, Thur and Sat  Takes 2.5mg  all other days   Yes Peter M Martinique, MD  diphenoxylate-atropine (LOMOTIL) 2.5-0.025 MG per tablet Take 1 tablet by mouth 4 (four) times daily as needed for diarrhea or loose stools. 08/08/14   Heath Lark, MD  lidocaine-prilocaine (EMLA) cream Apply 1 application topically as needed. Apply to [facility] a Cath site at least one hour prior to needle stick as needed. 01/04/14   Heath Lark, MD  prochlorperazine (COMPAZINE) 10 MG tablet Take 1 tablet (10 mg total) by mouth every 6 (six) hours as needed for nausea (nausea). 11/13/14   Ni Gorsuch, MD   BP 133/70 mmHg  Pulse 107  Resp 21  Ht 5\' 6"  (1.676 m)  Wt 133 lb (60.328 kg)  BMI 21.48 kg/m2  SpO2 96% Physical Exam  Constitutional: She is oriented to person, place, and time. She appears well-developed and well-nourished. No distress.  HENT:  Head: Normocephalic and atraumatic.  Mouth/Throat: Oropharynx is clear and moist.  Eyes: EOM are normal. Pupils are equal, round, and reactive to light.  Neck: Normal range of motion. Neck supple.  Cardiovascular: Normal rate and regular rhythm.  Exam reveals no gallop and no friction rub.   No murmur heard. Pulmonary/Chest: Effort normal and breath sounds normal. No respiratory distress. She has no wheezes.  Abdominal: Soft. Bowel sounds are normal. She exhibits no distension. There is no tenderness.  Musculoskeletal: Normal range of motion.  Neurological: She is alert and oriented to person,  place, and time. No cranial nerve deficit. She exhibits normal muscle tone. Coordination normal.  Skin: Skin is warm and dry. She is not diaphoretic.  Nursing note and vitals reviewed.   ED Course  Procedures (including critical care time) Labs Review Labs Reviewed  COMPREHENSIVE METABOLIC PANEL  CBC WITH DIFFERENTIAL/PLATELET  PROTIME-INR  I-STAT [place], ED    Imaging Review No results found. I have personally reviewed and evaluated these images and lab results as part of my medical decision-making.    MDM   Final diagnoses:  None    Patient is a 74 year old female who presents with weakness and vomiting that started earlier this afternoon. She has a history of lymphoma but is not undergoing any chemotherapy at present. Her workup reveals a low-grade fever of 100.4 and white count of 16.3. Her abdomen is benign and physical examination is otherwise unremarkable. Her vital signs are stable. She has no hypotension, tachycardia, or respiratory difficulty. I suspect her symptoms are likely viral in nature. She is feeling better after fluids and anti-emetics and wants to go home. She will be discharged with Zofran and instructions to return if her symptoms significantly worsen or change.    Veryl Speak, MD 01/24/15 [phone]

## 2015-01-23 NOTE — ED Notes (Signed)
Per EMS, pt felt weak and cold an went to bed around 1530 this afternoon. Pt reports periods of weakness x 2 years due to lymphoma. Pt also reports cough x 1 week. First responders noticed some right-sided deficits, which were resolved by the time EMS arrived on scene. Pt denies complaints at this time. CBG 172. Pt received a maintenance dose of chemo around October 15.

## 2015-01-24 LAB — URINALYSIS, ROUTINE W REFLEX MICROSCOPIC
Bilirubin Urine: NEGATIVE
Glucose, UA: NEGATIVE mg/dL
Hgb urine dipstick: NEGATIVE
Ketones, ur: NEGATIVE mg/dL
NITRITE: NEGATIVE
PROTEIN: NEGATIVE mg/dL
Specific Gravity, Urine: 1.019 (ref 1.005–1.030)
pH: 7 (ref 5.0–8.0)

## 2015-01-24 LAB — URINE MICROSCOPIC-ADD ON: RBC / HPF: NONE SEEN RBC/hpf (ref 0–5)

## 2015-01-24 NOTE — Discharge Instructions (Signed)
Tylenol 1000 mg rotated with Motrin 600 mg every 4 hours as needed for fever.  Return to the emergency department if you develop difficulty breathing, severe abdominal pain, bloody stool, or other new and concerning symptoms.   Fever, Adult A fever is an increase in the body's temperature. It is usually defined as a temperature of 100F (38C) or higher. Brief mild or moderate fevers generally have no long-term effects, and they often do not require treatment. Moderate or high fevers may make you feel uncomfortable and can sometimes be a sign of a serious illness or disease. The sweating that may occur with repeated or prolonged fever may also cause dehydration. Fever is confirmed by taking a temperature with a thermometer. A measured temperature can vary with:  Age.  Time of day.  Location of the thermometer:  Mouth (oral).  Rectum (rectal).  Ear (tympanic).  Underarm (axillary).  Forehead (temporal). HOME CARE INSTRUCTIONS Pay attention to any changes in your symptoms. Take these actions to help with your condition:  Take over-the counter and prescription medicines only as told by your health care provider. Follow the dosing instructions carefully.  If you were prescribed an antibiotic medicine, take it as told by your health care provider. Do not stop taking the antibiotic even if you start to feel better.  Rest as needed.  Drink enough fluid to keep your urine clear or pale yellow. This helps to prevent dehydration.  Sponge yourself or bathe with room-temperature water to help reduce your body temperature as needed. Do not use ice water.  Do not overbundle yourself in blankets or heavy clothes. SEEK MEDICAL CARE IF:  You vomit.  You cannot eat or drink without vomiting.  You have diarrhea.  You have pain when you urinate.  Your symptoms do not improve with treatment.  You develop new symptoms.  You develop excessive weakness. SEEK IMMEDIATE MEDICAL CARE  IF:  You have shortness of breath or have trouble breathing.  You are dizzy or you faint.  You are disoriented or confused.  You develop signs of dehydration, such as a dry mouth, decreased urination, or paleness.  You develop severe pain in your abdomen.  You have persistent vomiting or diarrhea.  You develop a skin rash.  Your symptoms suddenly get worse.   This information is not intended to replace advice given to you by your health care provider. Make sure you discuss any questions you have with your health care provider.   Document Released: 08/11/2000 Document Revised: 11/06/2014 Document Reviewed: 04/11/2014 Elsevier Interactive Patient Education [employer].

## 2015-01-28 DIAGNOSIS — E039 Hypothyroidism, unspecified: Secondary | ICD-10-CM | POA: Diagnosis not present

## 2015-01-31 DIAGNOSIS — Z7901 Long term (current) use of anticoagulants: Secondary | ICD-10-CM | POA: Diagnosis not present

## 2015-01-31 DIAGNOSIS — J45909 Unspecified asthma, uncomplicated: Secondary | ICD-10-CM | POA: Diagnosis not present

## 2015-02-10 ENCOUNTER — Other Ambulatory Visit: Payer: Self-pay

## 2015-02-10 DIAGNOSIS — Z1231 Encounter for screening mammogram for malignant neoplasm of breast: Secondary | ICD-10-CM

## 2015-02-12 ENCOUNTER — Other Ambulatory Visit (HOSPITAL_BASED_OUTPATIENT_CLINIC_OR_DEPARTMENT_OTHER): Payer: Medicare Other

## 2015-02-12 ENCOUNTER — Other Ambulatory Visit: Payer: Self-pay | Admitting: Medical Oncology

## 2015-02-12 ENCOUNTER — Ambulatory Visit (HOSPITAL_COMMUNITY)
Admission: RE | Admit: 2015-02-12 | Discharge: 2015-02-12 | Disposition: A | Discharge status: Home / Self Care | Payer: Medicare Other | Source: Ambulatory Visit | Attending: Hematology and Oncology | Admitting: Hematology and Oncology

## 2015-02-12 ENCOUNTER — Encounter (HOSPITAL_COMMUNITY): Payer: Self-pay

## 2015-02-12 ENCOUNTER — Ambulatory Visit: Payer: Medicare Other

## 2015-02-12 VITALS — BP 112/72 | HR 108 | Temp 98.0°F | Resp 18

## 2015-02-12 DIAGNOSIS — R918 Other nonspecific abnormal finding of lung field: Secondary | ICD-10-CM | POA: Insufficient documentation

## 2015-02-12 DIAGNOSIS — C8591 Non-Hodgkin lymphoma, unspecified, lymph nodes of head, face, and neck: Secondary | ICD-10-CM

## 2015-02-12 DIAGNOSIS — Z95828 Presence of other vascular implants and grafts: Secondary | ICD-10-CM

## 2015-02-12 DIAGNOSIS — C8590 Non-Hodgkin lymphoma, unspecified, unspecified site: Secondary | ICD-10-CM | POA: Diagnosis not present

## 2015-02-12 DIAGNOSIS — C8203 Follicular lymphoma grade I, intra-abdominal lymph nodes: Secondary | ICD-10-CM

## 2015-02-12 DIAGNOSIS — I2510 Atherosclerotic heart disease of native coronary artery without angina pectoris: Secondary | ICD-10-CM | POA: Insufficient documentation

## 2015-02-12 LAB — CBC WITH DIFFERENTIAL/PLATELET
BASO%: 0.3 % (ref 0.0–2.0)
BASOS ABS: 0.1 10*3/uL (ref 0.0–0.1)
EOS%: 0.4 % (ref 0.0–7.0)
Eosinophils Absolute: 0.1 10*3/uL (ref 0.0–0.5)
HCT: 31.6 % — ABNORMAL LOW (ref 34.8–46.6)
HGB: 10.1 g/dL — ABNORMAL LOW (ref 11.6–15.9)
LYMPH#: 0.8 10*3/uL — AB (ref 0.9–3.3)
LYMPH%: 4.3 % — AB (ref 14.0–49.7)
MCH: 27.6 pg (ref 25.1–34.0)
MCHC: 32.1 g/dL (ref 31.5–36.0)
MCV: 86 fL (ref 79.5–101.0)
MONO#: 1.4 10*3/uL — ABNORMAL HIGH (ref 0.1–0.9)
MONO%: 7.3 % (ref 0.0–14.0)
NEUT#: 16.5 10*3/uL — ABNORMAL HIGH (ref 1.5–6.5)
NEUT%: 87.7 % — AB (ref 38.4–76.8)
Platelets: 355 10*3/uL (ref 145–400)
RBC: 3.68 10*6/uL — AB (ref 3.70–5.45)
RDW: 15 % — ABNORMAL HIGH (ref 11.2–14.5)
WBC: 18.8 10*3/uL — ABNORMAL HIGH (ref 3.9–10.3)

## 2015-02-12 LAB — COMPREHENSIVE METABOLIC PANEL
ALBUMIN: 3.8 g/dL (ref 3.5–5.0)
ALK PHOS: 91 U/L (ref 40–150)
ALT: 18 U/L (ref 0–55)
AST: 15 U/L (ref 5–34)
Anion Gap: 10 mEq/L (ref 3–11)
BUN: 18.1 mg/dL (ref 7.0–26.0)
CO2: 24 mEq/L (ref 22–29)
Calcium: 9.6 mg/dL (ref 8.4–10.4)
Chloride: 103 mEq/L (ref 98–109)
Creatinine: 0.8 mg/dL (ref 0.6–1.1)
EGFR: 71 mL/min/{1.73_m2} — AB (ref >90)
GLUCOSE: 124 mg/dL (ref 70–140)
POTASSIUM: 4.4 meq/L (ref 3.5–5.1)
SODIUM: 137 meq/L (ref 136–145)
Total Bilirubin: 0.36 mg/dL (ref 0.20–1.20)
Total Protein: 6.7 g/dL (ref 6.4–8.3)

## 2015-02-12 MED ORDER — SODIUM CHLORIDE 0.9 % IJ SOLN
10.0000 mL | Freq: Once | INTRAMUSCULAR | Status: AC
  Administered 2015-02-12: 10 mL via INTRAVENOUS
  Filled 2015-02-12: qty 10

## 2015-02-12 MED ORDER — SODIUM CHLORIDE 0.9 % IJ SOLN
10.0000 mL | INTRAMUSCULAR | Status: DC | PRN
Start: 2015-02-12 — End: 2015-02-12
  Filled 2015-02-12: qty 10

## 2015-02-12 MED ORDER — IOHEXOL 300 MG/ML  SOLN
100.0000 mL | Freq: Once | INTRAMUSCULAR | Status: AC | PRN
  Administered 2015-02-12: 100 mL via INTRAVENOUS

## 2015-02-12 MED ORDER — HEPARIN SOD (PORK) LOCK FLUSH 100 UNIT/ML IV SOLN
500.0000 [IU] | Freq: Once | INTRAVENOUS | Status: DC
  Filled 2015-02-12: qty 5

## 2015-02-13 ENCOUNTER — Telehealth: Payer: Self-pay | Admitting: *Deleted

## 2015-02-13 ENCOUNTER — Telehealth: Payer: Self-pay | Admitting: Hematology and Oncology

## 2015-02-13 ENCOUNTER — Ambulatory Visit: Payer: Medicare Other

## 2015-02-13 ENCOUNTER — Ambulatory Visit (HOSPITAL_BASED_OUTPATIENT_CLINIC_OR_DEPARTMENT_OTHER): Payer: Medicare Other | Admitting: Hematology and Oncology

## 2015-02-13 ENCOUNTER — Encounter: Payer: Self-pay | Admitting: Hematology and Oncology

## 2015-02-13 VITALS — BP 103/55 | HR 108 | Temp 98.5°F | Resp 18 | Ht 66.0 in | Wt 129.9 lb

## 2015-02-13 DIAGNOSIS — I482 Chronic atrial fibrillation, unspecified: Secondary | ICD-10-CM

## 2015-02-13 DIAGNOSIS — D638 Anemia in other chronic diseases classified elsewhere: Secondary | ICD-10-CM

## 2015-02-13 DIAGNOSIS — J189 Pneumonia, unspecified organism: Secondary | ICD-10-CM

## 2015-02-13 DIAGNOSIS — C8591 Non-Hodgkin lymphoma, unspecified, lymph nodes of head, face, and neck: Secondary | ICD-10-CM

## 2015-02-13 DIAGNOSIS — J188 Other pneumonia, unspecified organism: Secondary | ICD-10-CM

## 2015-02-13 MED ORDER — PREDNISONE 50 MG PO TABS: 50.0000 mg | ORAL_TABLET | Freq: Every day | ORAL | Status: DC

## 2015-02-13 MED ORDER — AZITHROMYCIN 500 MG PO TABS: 500.0000 mg | ORAL_TABLET | Freq: Every day | ORAL | Status: DC

## 2015-02-13 NOTE — Progress Notes (Signed)
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   12/07/2013 Pathology Results Accession: [id number] biopsies show high-grade follicular lymphoma.   01/03/2014 Bone Marrow Biopsy Accession: [id number] Bone marrow biopsy is negative   01/04/2014 Imaging ECHO showed normal EF   01/04/2014 Procedure She has placement of port   01/09/2014 - 03/07/2014 Chemotherapy She was given treatment with bendamustine with rituximab. Treatment was stopped due to severe side-effects despite significant dose adjustment for cycle 2   01/18/2014 - 02/01/2014 Hospital Admission She was admitted to the hospital from Escherichia coli sepsis with multiorgan failure and brief episodes of intubation. She was discharged to skilled nursing facility   02/26/2014 Imaging PET/CT scan showed near complete remission   02/27/2014 Adverse Reaction Cycle 2 of treatment was resumed with drastic dose adjustment to bendamustine due to recent multi-organ failure   04/03/2014 -  Chemotherapy She is started on maintenance rituximab only.   04/09/2014 - 04/12/2014 Hospital Admission The patient was admitted to the hospital with urinary tract infection and sepsis.   06/05/2014 Imaging PET CT scan showed complete  response to Rx   02/12/2015 Imaging Ct scan showed no evidence of disease. It shows she has new pneumonia    INTERVAL HISTORY: Please see below for problem oriented charting. She returns today to follow-up on test results and before treatment with rituximab. Over the past few weeks, she have cough and shortness of breath. She was present some inhalers. Chest x-ray from November  24th 2016 show no evidence of pneumonia. At the time she presented to the emergency department for evaluation of low-grade fever. CT scan from December 14th, 2016, yesterday, show evidence of bilateral pneumonia. She denies fevers or chills. She complained of fatigue. No new lymphadenopathy.  REVIEW OF SYSTEMS:   Constitutional: Denies fevers, chills or abnormal weight loss Eyes: Denies blurriness of vision Ears, nose, mouth, throat, and face: Denies mucositis or sore throat Cardiovascular: Denies palpitation, chest discomfort or lower extremity swelling Gastrointestinal:  Denies nausea, heartburn or change in bowel habits Skin: Denies abnormal skin rashes Lymphatics: Denies new lymphadenopathy or easy bruising Neurological:Denies numbness, tingling or new weaknesses Behavioral/Psych: Mood is stable, no new changes  All other systems were reviewed with the patient and are negative.  I have reviewed the past medical history, past surgical history, social history and family history with the patient and they are unchanged from previous note.  ALLERGIES:  is allergic to morphine and related; multaq; demerol; oysters; penicillins; and sulfa drugs cross reactors.  MEDICATIONS:  Current Outpatient Prescriptions  Medication Sig Dispense Refill  . albuterol (PROAIR HFA) 108 (90 BASE) MCG/ACT inhaler Inhale 2 puffs into the lungs every 6 (six) hours as needed for wheezing or shortness of  breath.    . Cholecalciferol (VITAMIN D) 2000 UNITS tablet Take 2,000 Units by mouth daily with lunch.    . conjugated estrogens  (PREMARIN) vaginal cream Place 1 Applicatorful vaginally 3 (three) times a week. Monday, Wednesday, and Friday nights 42.5 g 4  . diltiazem (CARDIZEM CD) 120 MG 24 hr capsule Take 120 mg by mouth daily.     . diphenoxylate-atropine (LOMOTIL) 2.5-0.025 MG per tablet Take 1 tablet by mouth 4 (four) times daily as needed for diarrhea or loose stools. 90 tablet 0  . flecainide (TAMBOCOR) 100 MG tablet Take 50 mg by mouth 2 (two) times daily.    . Fluticasone-Salmeterol (ADVAIR) 250-50 MCG/DOSE AEPB Inhale 2 puffs into the lungs every 12 (twelve) hours.     Marland Kitchen levothyroxine (SYNTHROID, LEVOTHROID) 125 MCG tablet Take 250 mcg by mouth daily before breakfast.     . lidocaine-prilocaine (EMLA) cream Apply 1 application topically as needed. Apply to [facility] a Cath site at least one hour prior to needle stick as needed. 30 g 3  . Magnesium Oxide 500 MG TABS Take 500 mg by mouth at bedtime.    . metFORMIN (GLUCOPHAGE-XR) 500 MG 24 hr tablet Take 500 mg by mouth 2 (two) times daily with a meal.    . montelukast (SINGULAIR) 10 MG tablet Take 10 mg by mouth daily.    . potassium chloride SA (K-DUR,KLOR-CON) 20 MEQ tablet Take 1 tablet (20 mEq total) by mouth daily. 30 tablet 5  . pravastatin (PRAVACHOL) 40 MG tablet Take 20 mg by mouth See admin instructions. Take 1/2 tablet (20 mg) ONLY on Monday, Tuesday, Wednesday, and Thursday Nights.    . prochlorperazine (COMPAZINE) 10 MG tablet Take 1 tablet (10 mg total) by mouth every 6 (six) hours as needed for nausea (nausea). 30 tablet 6  . traMADol-acetaminophen (ULTRACET) 37.5-325 MG per tablet Take 1 tablet by mouth every 6 (six) hours as needed. (Patient taking differently: Take 1 tablet by mouth every 6 (six) hours as needed for moderate pain. ) 30 tablet 0  . triamterene-hydrochlorothiazide (MAXZIDE-25) 37.5-25 MG tablet Take 1 tablet by mouth daily as needed (for edema).     . vitamin B-12 (CYANOCOBALAMIN) 500 MCG tablet Take 500 mcg by mouth daily.    Marland Kitchen warfarin  (COUMADIN) 5 MG tablet Take 2.5-5 mg by mouth daily at 6 PM. Takes 18m on Tue, Thur and Sat  Takes 2.569mall other days    . azithromycin (ZITHROMAX) 500 MG tablet Take 1 tablet (500 mg total) by mouth daily. 3 tablet 0  . predniSONE (DELTASONE) 50 MG tablet Take 1 tablet (50 mg total) by mouth daily with breakfast. 10 tablet 0   No current facility-administered medications for this visit.    PHYSICAL EXAMINATION: ECOG PERFORMANCE STATUS: 1 - Symptomatic but completely ambulatory  Filed Vitals:   02/13/15 0945  BP: 103/55  Pulse: 108  Temp: 98.5 F (36.9 C)  Resp: 18   Filed Weights   02/13/15 0945  Weight: 129 lb 14.4 oz (58.922 kg)    GENERAL:alert, no distress and comfortable SKIN: skin color, texture, turgor are normal, no rashes or significant lesions EYES: normal, Conjunctiva are pink and non-injected, sclera clear OROPHARYNX:no exudate, no erythema and lips, buccal mucosa, and tongue normal  NECK: supple, thyroid normal size, non-tender, without nodularity LYMPH:  no palpable lymphadenopathy in the cervical, axillary or inguinal LUNGS: clear to auscultation and percussion with normal breathing effort HEART: regular rate & rhythm and no murmurs and no  lower extremity edema ABDOMEN:abdomen soft, non-tender and normal bowel sounds Musculoskeletal:no cyanosis of digits and no clubbing  NEURO: alert & oriented x 3 with fluent speech, no focal motor/sensory deficits  LABORATORY DATA:  I have reviewed the data as listed    Component Value Date/Time   NA 137 02/12/2015 0849   NA 137 01/23/2015 2232   K 4.4 02/12/2015 0849   K 4.4 01/23/2015 2232   CL 104 01/23/2015 2232   CO2 24 02/12/2015 0849   CO2 24 01/23/2015 2232   GLUCOSE 124 02/12/2015 0849   GLUCOSE 172* 01/23/2015 2232   BUN 18.1 02/12/2015 0849   BUN 18 01/23/2015 2232   CREATININE 0.8 02/12/2015 0849   CREATININE 0.97 01/23/2015 2232   CALCIUM 9.6 02/12/2015 0849   CALCIUM 9.4 01/23/2015 2232   PROT  6.7 02/12/2015 0849   PROT 6.0* 01/23/2015 2232   ALBUMIN 3.8 02/12/2015 0849   ALBUMIN 3.9 01/23/2015 2232   AST 15 02/12/2015 0849   AST 24 01/23/2015 2232   ALT 18 02/12/2015 0849   ALT 16 01/23/2015 2232   ALKPHOS 91 02/12/2015 0849   ALKPHOS 74 01/23/2015 2232   BILITOT 0.36 02/12/2015 0849   BILITOT 0.5 01/23/2015 2232   GFRNONAA 55* 01/23/2015 2232   GFRAA >60 01/23/2015 2232    No results found for: SPEP, UPEP  Lab Results  Component Value Date   WBC 18.8* 02/12/2015   NEUTROABS 16.5* 02/12/2015   HGB 10.1* 02/12/2015   HCT 31.6* 02/12/2015   MCV 86.0 02/12/2015   PLT 355 02/12/2015      Chemistry      Component Value Date/Time   NA 137 02/12/2015 0849   NA 137 01/23/2015 2232   K 4.4 02/12/2015 0849   K 4.4 01/23/2015 2232   CL 104 01/23/2015 2232   CO2 24 02/12/2015 0849   CO2 24 01/23/2015 2232   BUN 18.1 02/12/2015 0849   BUN 18 01/23/2015 2232   CREATININE 0.8 02/12/2015 0849   CREATININE 0.97 01/23/2015 2232      Component Value Date/Time   CALCIUM 9.6 02/12/2015 0849   CALCIUM 9.4 01/23/2015 2232   ALKPHOS 91 02/12/2015 0849   ALKPHOS 74 01/23/2015 2232   AST 15 02/12/2015 0849   AST 24 01/23/2015 2232   ALT 18 02/12/2015 0849   ALT 16 01/23/2015 2232   BILITOT 0.36 02/12/2015 0849   BILITOT 0.5 01/23/2015 2232       RADIOGRAPHIC STUDIES: I have personally reviewed the radiological images as listed and agreed with the findings in the report. Ct Chest W Contrast  02/12/2015  CLINICAL DATA:  Lymphoma in remission, continued chemotherapy. EXAM: CT CHEST, ABDOMEN, AND PELVIS WITH CONTRAST TECHNIQUE: Multidetector CT imaging of the chest, abdomen and pelvis was performed following the standard protocol during bolus administration of intravenous contrast. CONTRAST:  173m OMNIPAQUE IOHEXOL 300 MG/ML  SOLN COMPARISON:  06/05/2014 a.m. CT abdomen pelvis 04/09/2014. FINDINGS: CT CHEST FINDINGS Mediastinum/Nodes: Right IJ Port-A-Cath terminates in  the right atrium. Mediastinal lymph nodes measure up to 9 mm in the low right paratracheal station (image 22), stable. No hilar or axillary adenopathy. Atherosclerotic calcification of the arterial vasculature, including three-vessel involvement of the coronary arteries. Heart size normal. No pericardial effusion. Lungs/Pleura: New peribronchovascular ground-glass and nodular consolidation in both lower lobes. No pleural fluid. Airway is unremarkable. Esophagus is mildly air-filled throughout its course, which can be seen with dysmotility. Musculoskeletal: No worrisome lytic or sclerotic lesions. CT ABDOMEN PELVIS FINDINGS  Hepatobiliary: Liver is no longer fatty in appearance. Cholecystectomy. There is infiltrative soft tissue extending from the celiac axis region into the porta hepatis (series 2, image 56), likely similar. Mild prominence of the extrahepatic bile duct likely relates to age and cholecystectomy. Pancreas: Negative. Spleen: Mild heterogeneity is likely due to contrast bolus timing. Adrenals/Urinary Tract: Adrenal glands and kidneys are unremarkable. Ureters are decompressed. Tiny amount of air in the bladder is presumably iatrogenic. Stomach/Bowel: Stomach and small bowel are unremarkable. Appendectomy. Stool is seen in the majority of the colon. Vascular/Lymphatic: Atherosclerotic calcification of the arterial vasculature without abdominal aortic aneurysm. Aside from infiltrative soft tissue extending from the celiac axis to the porta hepatis (series 2, image 56), which appears grossly stable, no additional pathologically enlarged lymph nodes. Reproductive: Hysterectomy.  Ovaries are visualized. Other: No free fluid.  Mesenteries and peritoneum are unremarkable. Musculoskeletal: No worrisome lytic or sclerotic lesions. Degenerative changes are seen in the spine. IMPRESSION: 1. Infiltrative soft tissue extending from the celiac axis to the porta hepatis appears grossly stable. No evidence of new  adenopathy in the chest, abdomen or pelvis. 2. New peribronchovascular ground-glass and nodular consolidation in both lower lobes, most indicative of bronchopneumonia. 3. Coronary artery calcification. 4. Liver no longer appears fatty. Electronically Signed   By: Lorin Picket M.D.   On: 02/12/2015 12:05   Ct Abdomen Pelvis W Contrast  02/12/2015  CLINICAL DATA:  Lymphoma in remission, continued chemotherapy. EXAM: CT CHEST, ABDOMEN, AND PELVIS WITH CONTRAST TECHNIQUE: Multidetector CT imaging of the chest, abdomen and pelvis was performed following the standard protocol during bolus administration of intravenous contrast. CONTRAST:  144m OMNIPAQUE IOHEXOL 300 MG/ML  SOLN COMPARISON:  06/05/2014 a.m. CT abdomen pelvis 04/09/2014. FINDINGS: CT CHEST FINDINGS Mediastinum/Nodes: Right IJ Port-A-Cath terminates in the right atrium. Mediastinal lymph nodes measure up to 9 mm in the low right paratracheal station (image 22), stable. No hilar or axillary adenopathy. Atherosclerotic calcification of the arterial vasculature, including three-vessel involvement of the coronary arteries. Heart size normal. No pericardial effusion. Lungs/Pleura: New peribronchovascular ground-glass and nodular consolidation in both lower lobes. No pleural fluid. Airway is unremarkable. Esophagus is mildly air-filled throughout its course, which can be seen with dysmotility. Musculoskeletal: No worrisome lytic or sclerotic lesions. CT ABDOMEN PELVIS FINDINGS Hepatobiliary: Liver is no longer fatty in appearance. Cholecystectomy. There is infiltrative soft tissue extending from the celiac axis region into the porta hepatis (series 2, image 56), likely similar. Mild prominence of the extrahepatic bile duct likely relates to age and cholecystectomy. Pancreas: Negative. Spleen: Mild heterogeneity is likely due to contrast bolus timing. Adrenals/Urinary Tract: Adrenal glands and kidneys are unremarkable. Ureters are decompressed. Tiny amount  of air in the bladder is presumably iatrogenic. Stomach/Bowel: Stomach and small bowel are unremarkable. Appendectomy. Stool is seen in the majority of the colon. Vascular/Lymphatic: Atherosclerotic calcification of the arterial vasculature without abdominal aortic aneurysm. Aside from infiltrative soft tissue extending from the celiac axis to the porta hepatis (series 2, image 56), which appears grossly stable, no additional pathologically enlarged lymph nodes. Reproductive: Hysterectomy.  Ovaries are visualized. Other: No free fluid.  Mesenteries and peritoneum are unremarkable. Musculoskeletal: No worrisome lytic or sclerotic lesions. Degenerative changes are seen in the spine. IMPRESSION: 1. Infiltrative soft tissue extending from the celiac axis to the porta hepatis appears grossly stable. No evidence of new adenopathy in the chest, abdomen or pelvis. 2. New peribronchovascular ground-glass and nodular consolidation in both lower lobes, most indicative of bronchopneumonia. 3. Coronary artery calcification.  4. Liver no longer appears fatty. Electronically Signed   By: Lorin Picket M.D.   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   Recent PET/CT scan in April showed complete response to treatment.  CT scan from 02/12/2015 showed no evidence of disease. We will continue treatment every other month for total of 2 years. Due to ongoing infection, I will hold treatment for month and see her back next month for further assessment.  Chronic atrial fibrillation  She have chronic atrial fibrillation on chronic anticoagulation therapy. Since I will be putting her on antibiotics, I recommend she reduce the dose of Coumadin and have INR level recheck early next week with her managing physician.  Pneumonia, community acquired  She has evidence of  community-acquired pneumonia on CT and has symptoms of cough and shortness of breath. I recommend azithromycin 500 mg 3 days and prednisone 50 mg daily for 10 days. I will reassess next month. In the meantime, she will continue using inhaler as prescribed by her other doctor  Anemia in chronic illness This is likely due to recent treatment and anemia of chronic disease. The patient denies recent history of bleeding such as epistaxis, hematuria or hematochezia. She is asymptomatic from the anemia. I will observe for now.        No orders of the defined types were placed in this encounter.   All questions were answered. The patient knows to call the clinic with any problems, questions or concerns. No barriers to learning was detected. I spent 25 minutes counseling the patient face to face. The total time spent in the appointment was 30 minutes and more than 50% was on counseling and review of test results     [facility], [place], MD 02/13/2015 10:42 AM

## 2015-02-13 NOTE — Assessment & Plan Note (Signed)
This is likely due to recent treatment and anemia of chronic disease. The patient denies recent history of bleeding such as epistaxis, hematuria or hematochezia. She is asymptomatic from the anemia. I will observe for now.  

## 2015-02-13 NOTE — Assessment & Plan Note (Signed)
She has evidence of community-acquired pneumonia on CT and has symptoms of cough and shortness of breath. I recommend azithromycin 500 mg 3 days and prednisone 50 mg daily for 10 days. I will reassess next month. In the meantime, she will continue using inhaler as prescribed by her other doctor

## 2015-02-13 NOTE — Telephone Encounter (Signed)
Per staff message and POF I have scheduled appts. Advised scheduler of appts. JMW  

## 2015-02-13 NOTE — Assessment & Plan Note (Signed)
She have chronic atrial fibrillation on chronic anticoagulation therapy. Since I will be putting her on antibiotics, I recommend she reduce the dose of Coumadin and have INR level recheck early next week with her managing physician.

## 2015-02-13 NOTE — Telephone Encounter (Signed)
per pof to sch pt appt-sent MW email to sch pt trmt--pt aware after MD-gave copy of avs

## 2015-02-13 NOTE — Assessment & Plan Note (Signed)
Patient received her first cycle of maintenance Rituxan chemotherapy on 04/03/2014.   Recent PET/CT scan in April showed complete response to treatment.  CT scan from 02/12/2015 showed no evidence of disease. We will continue treatment every other month for total of 2 years. Due to ongoing infection, I will hold treatment for month and see her back next month for further assessment.

## 2015-02-17 DIAGNOSIS — Z7901 Long term (current) use of anticoagulants: Secondary | ICD-10-CM | POA: Diagnosis not present

## 2015-02-25 DIAGNOSIS — Z7901 Long term (current) use of anticoagulants: Secondary | ICD-10-CM | POA: Diagnosis not present

## 2015-03-02 ENCOUNTER — Emergency Department (HOSPITAL_COMMUNITY)
Admission: EM | Admit: 2015-03-02 | Discharge: 2015-03-02 | Disposition: A | Discharge status: Home / Self Care | Payer: Medicare Other | Attending: Emergency Medicine | Admitting: Emergency Medicine

## 2015-03-02 ENCOUNTER — Emergency Department (HOSPITAL_COMMUNITY): Payer: Medicare Other

## 2015-03-02 ENCOUNTER — Encounter (HOSPITAL_COMMUNITY): Payer: Self-pay | Admitting: Emergency Medicine

## 2015-03-02 DIAGNOSIS — E785 Hyperlipidemia, unspecified: Secondary | ICD-10-CM | POA: Diagnosis not present

## 2015-03-02 DIAGNOSIS — R000 Tachycardia, unspecified: Secondary | ICD-10-CM | POA: Insufficient documentation

## 2015-03-02 DIAGNOSIS — Z880 Allergy status to penicillin: Secondary | ICD-10-CM | POA: Diagnosis not present

## 2015-03-02 DIAGNOSIS — Z7951 Long term (current) use of inhaled steroids: Secondary | ICD-10-CM | POA: Diagnosis not present

## 2015-03-02 DIAGNOSIS — Z8619 Personal history of other infectious and parasitic diseases: Secondary | ICD-10-CM | POA: Diagnosis not present

## 2015-03-02 DIAGNOSIS — Z7901 Long term (current) use of anticoagulants: Secondary | ICD-10-CM | POA: Insufficient documentation

## 2015-03-02 DIAGNOSIS — Z862 Personal history of diseases of the blood and blood-forming organs and certain disorders involving the immune mechanism: Secondary | ICD-10-CM | POA: Insufficient documentation

## 2015-03-02 DIAGNOSIS — Z9981 Dependence on supplemental oxygen: Secondary | ICD-10-CM | POA: Diagnosis not present

## 2015-03-02 DIAGNOSIS — Z86018 Personal history of other benign neoplasm: Secondary | ICD-10-CM | POA: Insufficient documentation

## 2015-03-02 DIAGNOSIS — R300 Dysuria: Secondary | ICD-10-CM | POA: Diagnosis present

## 2015-03-02 DIAGNOSIS — Z79899 Other long term (current) drug therapy: Secondary | ICD-10-CM | POA: Insufficient documentation

## 2015-03-02 DIAGNOSIS — Z8742 Personal history of other diseases of the female genital tract: Secondary | ICD-10-CM | POA: Diagnosis not present

## 2015-03-02 DIAGNOSIS — G4733 Obstructive sleep apnea (adult) (pediatric): Secondary | ICD-10-CM | POA: Diagnosis not present

## 2015-03-02 DIAGNOSIS — I10 Essential (primary) hypertension: Secondary | ICD-10-CM | POA: Insufficient documentation

## 2015-03-02 DIAGNOSIS — H9190 Unspecified hearing loss, unspecified ear: Secondary | ICD-10-CM | POA: Insufficient documentation

## 2015-03-02 DIAGNOSIS — Z8719 Personal history of other diseases of the digestive system: Secondary | ICD-10-CM | POA: Insufficient documentation

## 2015-03-02 DIAGNOSIS — J45909 Unspecified asthma, uncomplicated: Secondary | ICD-10-CM | POA: Insufficient documentation

## 2015-03-02 DIAGNOSIS — R05 Cough: Secondary | ICD-10-CM | POA: Insufficient documentation

## 2015-03-02 DIAGNOSIS — Z87448 Personal history of other diseases of urinary system: Secondary | ICD-10-CM | POA: Diagnosis not present

## 2015-03-02 DIAGNOSIS — E119 Type 2 diabetes mellitus without complications: Secondary | ICD-10-CM | POA: Diagnosis not present

## 2015-03-02 DIAGNOSIS — R509 Fever, unspecified: Secondary | ICD-10-CM | POA: Diagnosis not present

## 2015-03-02 DIAGNOSIS — N390 Urinary tract infection, site not specified: Secondary | ICD-10-CM | POA: Diagnosis not present

## 2015-03-02 DIAGNOSIS — Z8572 Personal history of non-Hodgkin lymphomas: Secondary | ICD-10-CM | POA: Diagnosis not present

## 2015-03-02 DIAGNOSIS — Z7952 Long term (current) use of systemic steroids: Secondary | ICD-10-CM | POA: Diagnosis not present

## 2015-03-02 DIAGNOSIS — E039 Hypothyroidism, unspecified: Secondary | ICD-10-CM | POA: Insufficient documentation

## 2015-03-02 DIAGNOSIS — M47816 Spondylosis without myelopathy or radiculopathy, lumbar region: Secondary | ICD-10-CM | POA: Insufficient documentation

## 2015-03-02 HISTORY — PX: MASTECTOMY: SHX3

## 2015-03-02 LAB — URINE MICROSCOPIC-ADD ON: RBC / HPF: NONE SEEN RBC/hpf (ref 0–5)

## 2015-03-02 LAB — URINALYSIS, ROUTINE W REFLEX MICROSCOPIC
BILIRUBIN URINE: NEGATIVE
Glucose, UA: NEGATIVE mg/dL
HGB URINE DIPSTICK: NEGATIVE
KETONES UR: NEGATIVE mg/dL
NITRITE: NEGATIVE
PROTEIN: NEGATIVE mg/dL
Specific Gravity, Urine: 1.016 (ref 1.005–1.030)
pH: 7.5 (ref 5.0–8.0)

## 2015-03-02 LAB — CBC WITH DIFFERENTIAL/PLATELET
BASOS PCT: 0 %
Basophils Absolute: 0 10*3/uL (ref 0.0–0.1)
EOS ABS: 0.1 10*3/uL (ref 0.0–0.7)
Eosinophils Relative: 0 %
HEMATOCRIT: 31.7 % — AB (ref 36.0–46.0)
Hemoglobin: 10.2 g/dL — ABNORMAL LOW (ref 12.0–15.0)
Lymphocytes Relative: 2 %
Lymphs Abs: 0.4 10*3/uL — ABNORMAL LOW (ref 0.7–4.0)
MCH: 28.7 pg (ref 26.0–34.0)
MCHC: 32.2 g/dL (ref 30.0–36.0)
MCV: 89.3 fL (ref 78.0–100.0)
MONO ABS: 1 10*3/uL (ref 0.1–1.0)
MONOS PCT: 5 %
Neutro Abs: 17.5 10*3/uL — ABNORMAL HIGH (ref 1.7–7.7)
Neutrophils Relative %: 93 %
Platelets: 200 10*3/uL (ref 150–400)
RBC: 3.55 MIL/uL — ABNORMAL LOW (ref 3.87–5.11)
RDW: 15 % (ref 11.5–15.5)
WBC: 18.9 10*3/uL — ABNORMAL HIGH (ref 4.0–10.5)

## 2015-03-02 LAB — COMPREHENSIVE METABOLIC PANEL
ALK PHOS: 100 U/L (ref 38–126)
ALT: 24 U/L (ref 14–54)
ANION GAP: 10 (ref 5–15)
AST: 25 U/L (ref 15–41)
Albumin: 3.5 g/dL (ref 3.5–5.0)
BUN: 14 mg/dL (ref 6–20)
CALCIUM: 8.9 mg/dL (ref 8.9–10.3)
CHLORIDE: 104 mmol/L (ref 101–111)
CO2: 24 mmol/L (ref 22–32)
Creatinine, Ser: 0.78 mg/dL (ref 0.44–1.00)
Glucose, Bld: 171 mg/dL — ABNORMAL HIGH (ref 65–99)
Potassium: 4.4 mmol/L (ref 3.5–5.1)
SODIUM: 138 mmol/L (ref 135–145)
Total Bilirubin: 0.6 mg/dL (ref 0.3–1.2)
Total Protein: 6.4 g/dL — ABNORMAL LOW (ref 6.5–8.1)

## 2015-03-02 LAB — I-STAT CG4 LACTIC ACID, ED
LACTIC ACID, VENOUS: 2.63 mmol/L — AB (ref 0.5–2.0)
LACTIC ACID, VENOUS: 2.22 mmol/L — AB (ref 0.5–2.0)

## 2015-03-02 MED ORDER — CEPHALEXIN 500 MG PO CAPS
500.0000 mg | ORAL_CAPSULE | Freq: Once | ORAL | Status: AC
  Administered 2015-03-02: 500 mg via ORAL
  Filled 2015-03-02: qty 1

## 2015-03-02 MED ORDER — DOXYCYCLINE HYCLATE 100 MG PO TABS: 100.0000 mg | ORAL_TABLET | Freq: Two times a day (BID) | ORAL | Status: DC

## 2015-03-02 MED ORDER — SODIUM CHLORIDE 0.9 % IV BOLUS (SEPSIS)
1000.0000 mL | Freq: Once | INTRAVENOUS | Status: AC
  Administered 2015-03-02: 1000 mL via INTRAVENOUS

## 2015-03-02 MED ORDER — IBUPROFEN 200 MG PO TABS
400.0000 mg | ORAL_TABLET | Freq: Once | ORAL | Status: AC
  Administered 2015-03-02: 400 mg via ORAL
  Filled 2015-03-02: qty 2

## 2015-03-02 MED ORDER — CEPHALEXIN 500 MG PO CAPS: 500.0000 mg | ORAL_CAPSULE | Freq: Three times a day (TID) | ORAL | Status: DC

## 2015-03-02 MED ORDER — HEPARIN SOD (PORK) LOCK FLUSH 100 UNIT/ML IV SOLN
500.0000 [IU] | Freq: Once | INTRAVENOUS | Status: AC
  Administered 2015-03-02: 500 [IU]
  Filled 2015-03-02: qty 5

## 2015-03-02 MED ORDER — ONDANSETRON HCL 4 MG/2ML IJ SOLN
4.0000 mg | Freq: Once | INTRAMUSCULAR | Status: AC
  Administered 2015-03-02: 4 mg via INTRAVENOUS
  Filled 2015-03-02: qty 2

## 2015-03-02 MED ORDER — ACETAMINOPHEN 325 MG PO TABS
650.0000 mg | ORAL_TABLET | Freq: Once | ORAL | Status: DC
  Filled 2015-03-02: qty 2

## 2015-03-02 MED ORDER — DOXYCYCLINE HYCLATE 100 MG PO TABS
100.0000 mg | ORAL_TABLET | Freq: Once | ORAL | Status: AC
  Administered 2015-03-02: 100 mg via ORAL
  Filled 2015-03-02: qty 1

## 2015-03-02 MED ORDER — SODIUM CHLORIDE 0.9 % IV BOLUS (SEPSIS): 1000.0000 mL | Freq: Once | INTRAVENOUS | Status: DC

## 2015-03-02 NOTE — ED Notes (Signed)
Bed: WA03 Expected date:  Expected time:  Means of arrival:  Comments: Hold for triage 2 

## 2015-03-02 NOTE — ED Provider Notes (Signed)
CSN: [id number]     Arrival date & time 03/02/15  1626 History   First MD Initiated Contact with Patient 03/02/15 1640     Chief Complaint  Patient presents with  . Fever  . Dysuria     (Consider location/radiation/quality/duration/timing/severity/associated sxs/prior Treatment) Patient is a 75 y.o. female presenting with dysuria.  Dysuria Pain quality:  Burning Pain severity:  Mild Duration:  2 days Timing:  Constant Chronicity:  New Recent urinary tract infections: no   Relieved by:  None tried Worsened by:  Nothing tried Ineffective treatments:  None tried Urinary symptoms: foul-smelling urine and frequent urination   Associated symptoms: fever     Past Medical History  Diagnosis Date  . Asthma 06/15/2010  . Hypothyroidism 06/15/2010  . Hyperlipemia 06/15/2010  . Atrial fibrillation ([place]) 05/28/2010    Managed with rate control and coumadin  . Long-term (current) use of anticoagulants   . ACE-inhibitor cough   . DI (detrusor instability)   . Fibroid   . Rectocele   . Atrophic vaginitis   . Cancer [facility]) 2008    Colon polyp-early adenoCA  . Diabetes mellitus     Type 2  . Peripheral neuropathy ([place])   . Hypertension   . Dysrhythmia     cardioversion - 2012  . Complication of anesthesia 1980's    post anesth.- states she "went into resp. arrest" , but then remarked that she thought maybe they gave her too much medicine (morphine)   . Shortness of breath     sometimes   . 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 Anemia   . 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 CPAP (continuous positive airway pressure) dependence   . GERD (gastroesophageal reflux disease)   . Hearing loss   . Sleep apnea     uses CPAP everynight- last study in [place] 5 yrs. or more   . Severe sepsis with septic shock ([place]) 01/21/2014  . E coli bacteremia 01/20/2014  . 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 Appendectomy  1953  . Partial hysterectomy  1972  . Cholecystectomy  1993  . Back surgery      x2  . Cataract extraction    . Cardiac catheterization  10/10/2008    Nonobstructive CAD  . Abdominal hysterectomy  1972    TAH  . Foot surgery    . Tonsillectomy    . Pubovag repair with sling    . Skin tag removal  2012    leg  . Cardioversion  04/13/2011    Procedure: CARDIOVERSION;  Surgeon: Laverda Page, MD;  Location: [facility];  Service: Cardiovascular;  Laterality: N/A;  . Eye surgery      /w IOL, post cataracts removed   . Mass biopsy Left 12/07/2013    Procedure: EXCISIONAL BIOPSY LEFT NECK MASS ;  Surgeon: Jerrell Belfast, MD;  Location: [facility] OR;  Service: ENT;  Laterality: Left;   Family History  Problem Relation Age of Onset  . Stroke Father     at 69  . Heart attack Father     at 9  . Hypertension Father   . Heart disease Mother   . Arthritis Mother   . Breast cancer Mother     Age 56  . Heart failure Mother   . Heart attack Brother   . Hypertension Brother   . Diabetes Sister    Social  History  Substance Use Topics  . Smoking status: Never Smoker   . Smokeless tobacco: Never Used  . Alcohol Use: No   OB History    Gravida Para Term Preterm AB TAB SAB Ectopic Multiple Living   2 2 2       2      Review of Systems  Constitutional: Positive for fever. Negative for chills.  Respiratory: Positive for cough. Negative for shortness of breath.   Gastrointestinal: Negative for constipation and blood in stool.  Genitourinary: Positive for dysuria.  All other systems reviewed and are negative.     Allergies  Morphine and related; Multaq; Demerol; Oysters; Penicillins; and Sulfa drugs cross reactors  Home Medications   Prior to Admission medications   Medication Sig Start Date End Date Taking? Authorizing Provider  acetaminophen (TYLENOL) 500 MG tablet Take 1,000 mg by mouth every 6 (six) hours as needed for moderate pain, fever or  headache.   Yes Historical Provider, MD  albuterol (PROAIR HFA) 108 (90 BASE) MCG/ACT inhaler Inhale 2 puffs into the lungs every 6 (six) hours as needed for wheezing or shortness of breath.   Yes Historical Provider, MD  albuterol (PROVENTIL) (2.5 MG/3ML) 0.083% nebulizer solution Inhale 3 mLs into the lungs every 6 (six) hours as needed for wheezing or shortness of breath.  02/27/15  Yes Historical Provider, MD  Cholecalciferol (VITAMIN D) 2000 UNITS tablet Take 2,000 Units by mouth daily with lunch.   Yes Historical Provider, MD  conjugated estrogens (PREMARIN) vaginal cream Place 1 Applicatorful vaginally 3 (three) times a week. Monday, Wednesday, and Friday nights 01/13/15  Yes Terrance Mass, MD  diltiazem (CARDIZEM CD) 120 MG 24 hr capsule Take 120 mg by mouth daily.  06/07/11  Yes Peter M Martinique, MD  diphenoxylate-atropine (LOMOTIL) 2.5-0.025 MG per tablet Take 1 tablet by mouth 4 (four) times daily as needed for diarrhea or loose stools. 08/08/14  Yes Heath Lark, MD  flecainide (TAMBOCOR) 100 MG tablet Take 50 mg by mouth 2 (two) times daily.   Yes Historical Provider, MD  Fluticasone-Salmeterol (ADVAIR) 250-50 MCG/DOSE AEPB Inhale 2 puffs into the lungs every 12 (twelve) hours.    Yes Historical Provider, MD  levothyroxine (SYNTHROID, LEVOTHROID) 75 MCG tablet Take 225 mcg by mouth daily before breakfast.   Yes Historical Provider, MD  lidocaine-prilocaine (EMLA) cream Apply 1 application topically as needed. Apply to [facility] a Cath site at least one hour prior to needle stick as needed. 01/04/14  Yes Heath Lark, MD  Magnesium Oxide 500 MG TABS Take 500 mg by mouth daily.    Yes Historical Provider, MD  metFORMIN (GLUCOPHAGE-XR) 500 MG 24 hr tablet Take 500 mg by mouth 2 (two) times daily with a meal.   Yes Historical Provider, MD  montelukast (SINGULAIR) 10 MG tablet Take 10 mg by mouth daily.   Yes Historical Provider, MD  ondansetron (ZOFRAN) 8 MG tablet Take 8 mg by mouth every 8 (eight) hours  as needed for nausea or vomiting.   Yes Historical Provider, MD  potassium chloride SA (K-DUR,KLOR-CON) 20 MEQ tablet Take 1 tablet (20 mEq total) by mouth daily. 12/22/11  Yes Peter M Martinique, MD  pravastatin (PRAVACHOL) 40 MG tablet Take 20 mg by mouth See admin instructions. Take 1/2 tablet (20 mg) ONLY on Monday, Tuesday, Wednesday, and Thursday Nights.   Yes Historical Provider, MD  prochlorperazine (COMPAZINE) 10 MG tablet Take 1 tablet (10 mg total) by mouth every 6 (six) hours as needed for  nausea (nausea). 11/13/14  Yes Heath Lark, MD  traMADol-acetaminophen (ULTRACET) 37.5-325 MG per tablet Take 1 tablet by mouth every 6 (six) hours as needed. Patient taking differently: Take 1 tablet by mouth every 6 (six) hours as needed for moderate pain.  02/01/14  Yes Venetia Maxon Rama, MD  triamterene-hydrochlorothiazide (MAXZIDE-25) 37.5-25 MG tablet Take 1 tablet by mouth daily as needed (for edema).    Yes Historical Provider, MD  vitamin B-12 (CYANOCOBALAMIN) 500 MCG tablet Take 500 mcg by mouth daily.   Yes Historical Provider, MD  warfarin (COUMADIN) 5 MG tablet Take 2.5 mg by mouth daily.    Yes Peter M Martinique, MD  azithromycin (ZITHROMAX) 500 MG tablet Take 1 tablet (500 mg total) by mouth daily. 02/13/15   Heath Lark, MD  cephALEXin (KEFLEX) 500 MG capsule Take 1 capsule (500 mg total) by mouth 3 (three) times daily. 03/02/15   Merrily Pew, MD  doxycycline (VIBRA-TABS) 100 MG tablet Take 1 tablet (100 mg total) by mouth 2 (two) times daily. 03/02/15   Merrily Pew, MD  predniSONE (DELTASONE) 50 MG tablet Take 1 tablet (50 mg total) by mouth daily with breakfast. 02/13/15   Heath Lark, MD   BP 91/52 mmHg  Pulse 97  Temp(Src) 98.8 F (37.1 C) (Oral)  Resp 20  SpO2 98% Physical Exam  Constitutional: She is oriented to person, place, and time. She appears well-developed and well-nourished.  HENT:  Head: Normocephalic and atraumatic.  Neck: Normal range of motion.  Cardiovascular: Regular  rhythm.  Tachycardia present.   Pulmonary/Chest: Effort normal. No stridor. No respiratory distress. She has rales (right middle lobe).  Abdominal: Soft. She exhibits no distension. There is no rebound.  Musculoskeletal: Normal range of motion. She exhibits no edema or tenderness.  Neurological: She is alert and oriented to person, place, and time. No cranial nerve deficit. Coordination normal.  Skin: Skin is warm and dry. No rash noted. No erythema.  Nursing note and vitals reviewed.   ED Course  Procedures (including critical care time) Labs Review Labs Reviewed  COMPREHENSIVE METABOLIC PANEL - Abnormal; Notable for the following:    Glucose, Bld 171 (*)    Total Protein 6.4 (*)    All other components within normal limits  URINALYSIS, ROUTINE W REFLEX MICROSCOPIC (NOT AT [facility]) - Abnormal; Notable for the following:    APPearance TURBID (*)    Leukocytes, UA MODERATE (*)    All other components within normal limits  CBC WITH DIFFERENTIAL/PLATELET - Abnormal; Notable for the following:    WBC 18.9 (*)    RBC 3.55 (*)    Hemoglobin 10.2 (*)    HCT 31.7 (*)    Neutro Abs 17.5 (*)    Lymphs Abs 0.4 (*)    All other components within normal limits  URINE MICROSCOPIC-ADD ON - Abnormal; Notable for the following:    Squamous Epithelial / LPF 6-30 (*)    Bacteria, UA MANY (*)    All other components within normal limits  I-STAT CG4 LACTIC ACID, ED - Abnormal; Notable for the following:    Lactic Acid, Venous 2.63 (*)    All other components within normal limits  I-STAT CG4 LACTIC ACID, ED - Abnormal; Notable for the following:    Lactic Acid, Venous 2.22 (*)    All other components within normal limits  CULTURE, BLOOD (ROUTINE X 2)  CULTURE, BLOOD (ROUTINE X 2)  URINE CULTURE  I-STAT CG4 LACTIC ACID, ED    Imaging Review Dg  Chest 2 View  03/02/2015  CLINICAL DATA:  One month history of cough with fever today. EXAM: CHEST  2 VIEW COMPARISON:  01/23/2015 FINDINGS: Two-view exam  shows new patchy airspace disease in the right lower lung. Left lung remains clear. The cardiopericardial silhouette is within normal limits for size. Right-sided Port-A-Cath remains in place with distal tip position overlying the SVC/ RA junction. The visualized bony structures of the thorax are intact. IMPRESSION: New patchy right lower lung airspace disease, compatible with pneumonia. Electronically Signed   By: Misty Stanley M.D.   On: 03/02/2015 18:05   I have personally reviewed and evaluated these images and lab results as part of my medical decision-making.   EKG Interpretation None      MDM   Final diagnoses:  UTI (lower urinary tract infection)   Here with approximately a week of cough and also a few days of UTI type symptoms. Found to have UTI and pneumonia on her workup. Initially tachycardic, elevated lactic acid and leukocytosis. Discussed admission with the patient for IV antibiotics and fluid hydration however patient really wanted to go home, her husband was willing to support her and she stated she could easily get close PCP follow up. Patient was observed in the emergency department approximate 3-4 hours got fluid hydration was tolerating by mouth and took antibiotics. Lactic acid improved her heart rate improved her blood pressure was stable. No worsening respiratory distress or other symptoms. Patient was once again offered admission however wanted to go home so patient was discharged with very strict return precautions and close PCP follow-up.      Merrily Pew, MD 03/03/15 [id number]

## 2015-03-02 NOTE — ED Notes (Signed)
Pt states that she has had a fever today up to 103, N/V and foul smelling urine. Recent hx of PNA. Alert and oriented.

## 2015-03-05 DIAGNOSIS — R638 Other symptoms and signs concerning food and fluid intake: Secondary | ICD-10-CM | POA: Diagnosis not present

## 2015-03-05 DIAGNOSIS — N390 Urinary tract infection, site not specified: Secondary | ICD-10-CM | POA: Diagnosis not present

## 2015-03-05 DIAGNOSIS — J189 Pneumonia, unspecified organism: Secondary | ICD-10-CM | POA: Diagnosis not present

## 2015-03-05 LAB — URINE CULTURE

## 2015-03-06 ENCOUNTER — Telehealth (HOSPITAL_COMMUNITY): Payer: Self-pay

## 2015-03-06 DIAGNOSIS — E119 Type 2 diabetes mellitus without complications: Secondary | ICD-10-CM | POA: Diagnosis not present

## 2015-03-06 DIAGNOSIS — Z961 Presence of intraocular lens: Secondary | ICD-10-CM | POA: Diagnosis not present

## 2015-03-06 NOTE — Telephone Encounter (Signed)
Post ED Visit - Positive Culture Follow-up  Culture report reviewed by antimicrobial stewardship pharmacist:  []  Elenor Quinones, Pharm.D. []  Heide Guile, Pharm.D., BCPS []  Parks Neptune, Pharm.D. []  Alycia Rossetti, Pharm.D., BCPS []  Grady, Pharm.D., BCPS, AAHIVP []  Legrand Como, Pharm.D., BCPS, AAHIVP [x]  Milus Glazier, Pharm.D. []  Rob Evette Doffing, [place].D.  Positive urine culture, >/= 100,000 colonies -> E Coli Treated with Cephalexin, organism sensitive to the same and no further patient follow-up is required at this time.  Dortha Kern 03/06/2015, 12:29 PM

## 2015-03-07 DIAGNOSIS — J189 Pneumonia, unspecified organism: Secondary | ICD-10-CM | POA: Diagnosis not present

## 2015-03-07 DIAGNOSIS — D72829 Elevated white blood cell count, unspecified: Secondary | ICD-10-CM | POA: Diagnosis not present

## 2015-03-07 LAB — CULTURE, BLOOD (ROUTINE X 2): Culture: NO GROWTH

## 2015-03-08 LAB — CULTURE, BLOOD (ROUTINE X 2): Culture: NO GROWTH

## 2015-03-10 ENCOUNTER — Telehealth: Payer: Self-pay | Admitting: *Deleted

## 2015-03-10 NOTE — Telephone Encounter (Signed)
Pt says she is feeling better today, recovering from Pneumonia and UTI.  "It is the first day I feel like a real person in a while."   She sees Dr Shelia Media again tomorrow.  She confirms appt w/ Dr. Alvy Bimler later this week on 1/12.

## 2015-03-11 DIAGNOSIS — Z7901 Long term (current) use of anticoagulants: Secondary | ICD-10-CM | POA: Diagnosis not present

## 2015-03-11 DIAGNOSIS — J189 Pneumonia, unspecified organism: Secondary | ICD-10-CM | POA: Diagnosis not present

## 2015-03-13 ENCOUNTER — Ambulatory Visit (HOSPITAL_BASED_OUTPATIENT_CLINIC_OR_DEPARTMENT_OTHER): Payer: Medicare Other

## 2015-03-13 ENCOUNTER — Encounter: Payer: Self-pay | Admitting: Hematology and Oncology

## 2015-03-13 ENCOUNTER — Ambulatory Visit (HOSPITAL_BASED_OUTPATIENT_CLINIC_OR_DEPARTMENT_OTHER): Payer: Medicare Other | Admitting: Hematology and Oncology

## 2015-03-13 ENCOUNTER — Other Ambulatory Visit (HOSPITAL_BASED_OUTPATIENT_CLINIC_OR_DEPARTMENT_OTHER): Payer: Medicare Other

## 2015-03-13 VITALS — BP 119/60 | HR 101 | Temp 98.2°F | Resp 18

## 2015-03-13 VITALS — BP 96/67 | HR 106 | Temp 98.3°F | Resp 18 | Ht 66.0 in | Wt 134.1 lb

## 2015-03-13 DIAGNOSIS — D801 Nonfamilial hypogammaglobulinemia: Secondary | ICD-10-CM

## 2015-03-13 DIAGNOSIS — C8228 Follicular lymphoma grade III, unspecified, lymph nodes of multiple sites: Secondary | ICD-10-CM

## 2015-03-13 DIAGNOSIS — Z5112 Encounter for antineoplastic immunotherapy: Secondary | ICD-10-CM | POA: Diagnosis not present

## 2015-03-13 DIAGNOSIS — Z7901 Long term (current) use of anticoagulants: Secondary | ICD-10-CM

## 2015-03-13 DIAGNOSIS — C8591 Non-Hodgkin lymphoma, unspecified, lymph nodes of head, face, and neck: Secondary | ICD-10-CM | POA: Diagnosis not present

## 2015-03-13 DIAGNOSIS — D638 Anemia in other chronic diseases classified elsewhere: Secondary | ICD-10-CM

## 2015-03-13 LAB — COMPREHENSIVE METABOLIC PANEL
ALT: 18 U/L (ref 0–55)
ANION GAP: 9 meq/L (ref 3–11)
AST: 16 U/L (ref 5–34)
Albumin: 3.3 g/dL — ABNORMAL LOW (ref 3.5–5.0)
Alkaline Phosphatase: 112 U/L (ref 40–150)
BUN: 19.4 mg/dL (ref 7.0–26.0)
CALCIUM: 9.8 mg/dL (ref 8.4–10.4)
CHLORIDE: 102 meq/L (ref 98–109)
CO2: 27 meq/L (ref 22–29)
Creatinine: 0.8 mg/dL (ref 0.6–1.1)
EGFR: 72 mL/min/{1.73_m2} — AB (ref >90)
Glucose: 100 mg/dl (ref 70–140)
POTASSIUM: 4.4 meq/L (ref 3.5–5.1)
Sodium: 139 mEq/L (ref 136–145)
Total Bilirubin: 0.35 mg/dL (ref 0.20–1.20)
Total Protein: 6.6 g/dL (ref 6.4–8.3)

## 2015-03-13 LAB — CBC WITH DIFFERENTIAL/PLATELET
BASO%: 0.6 % (ref 0.0–2.0)
BASOS ABS: 0.1 10*3/uL (ref 0.0–0.1)
EOS%: 1.1 % (ref 0.0–7.0)
Eosinophils Absolute: 0.1 10*3/uL (ref 0.0–0.5)
HEMATOCRIT: 30.1 % — AB (ref 34.8–46.6)
HGB: 9.9 g/dL — ABNORMAL LOW (ref 11.6–15.9)
LYMPH#: 0.8 10*3/uL — AB (ref 0.9–3.3)
LYMPH%: 8.4 % — AB (ref 14.0–49.7)
MCH: 28 pg (ref 25.1–34.0)
MCHC: 32.9 g/dL (ref 31.5–36.0)
MCV: 85.1 fL (ref 79.5–101.0)
MONO#: 0.7 10*3/uL (ref 0.1–0.9)
MONO%: 7.2 % (ref 0.0–14.0)
NEUT#: 8.2 10*3/uL — ABNORMAL HIGH (ref 1.5–6.5)
NEUT%: 82.7 % — AB (ref 38.4–76.8)
PLATELETS: 547 10*3/uL — AB (ref 145–400)
RBC: 3.54 10*6/uL — AB (ref 3.70–5.45)
RDW: 15.7 % — ABNORMAL HIGH (ref 11.2–14.5)
WBC: 9.9 10*3/uL (ref 3.9–10.3)

## 2015-03-13 LAB — TECHNOLOGIST REVIEW

## 2015-03-13 MED ORDER — ACETAMINOPHEN 325 MG PO TABS
ORAL_TABLET | ORAL | Status: AC
  Filled 2015-03-13: qty 2

## 2015-03-13 MED ORDER — ACETAMINOPHEN 325 MG PO TABS
650.0000 mg | ORAL_TABLET | Freq: Once | ORAL | Status: AC
  Administered 2015-03-13: 650 mg via ORAL

## 2015-03-13 MED ORDER — HEPARIN SOD (PORK) LOCK FLUSH 100 UNIT/ML IV SOLN
500.0000 [IU] | Freq: Once | INTRAVENOUS | Status: AC | PRN
  Administered 2015-03-13: 500 [IU]
  Filled 2015-03-13: qty 5

## 2015-03-13 MED ORDER — SODIUM CHLORIDE 0.9 % IV SOLN
INTRAVENOUS | Status: AC
  Administered 2015-03-13: 10:00:00 via INTRAVENOUS

## 2015-03-13 MED ORDER — SODIUM CHLORIDE 0.9 % IJ SOLN
10.0000 mL | INTRAMUSCULAR | Status: DC | PRN
  Administered 2015-03-13: 10 mL
  Filled 2015-03-13: qty 10

## 2015-03-13 MED ORDER — DIPHENHYDRAMINE HCL 25 MG PO CAPS
50.0000 mg | ORAL_CAPSULE | Freq: Once | ORAL | Status: AC
  Administered 2015-03-13: 50 mg via ORAL

## 2015-03-13 MED ORDER — DIPHENHYDRAMINE HCL 25 MG PO CAPS
ORAL_CAPSULE | ORAL | Status: AC
  Filled 2015-03-13: qty 2

## 2015-03-13 MED ORDER — SODIUM CHLORIDE 0.9 % IV SOLN
375.0000 mg/m2 | Freq: Once | INTRAVENOUS | Status: AC
  Administered 2015-03-13: 600 mg via INTRAVENOUS
  Filled 2015-03-13: qty 60

## 2015-03-13 NOTE — Patient Instructions (Signed)
[facility] Discharge Instructions for Patients Receiving Chemotherapy  Today you received the following chemotherapy agents rituxan  To help prevent nausea and vomiting after your treatment, we encourage you to take your nausea medication as directed   If you develop nausea and vomiting that is not controlled by your nausea medication, call the clinic.   BELOW ARE SYMPTOMS THAT SHOULD BE REPORTED IMMEDIATELY:  *FEVER GREATER THAN 100.5 F  *CHILLS WITH OR WITHOUT FEVER  NAUSEA AND VOMITING THAT IS NOT CONTROLLED WITH YOUR NAUSEA MEDICATION  *UNUSUAL SHORTNESS OF BREATH  *UNUSUAL BRUISING OR BLEEDING  TENDERNESS IN MOUTH AND THROAT WITH OR WITHOUT PRESENCE OF ULCERS  *URINARY PROBLEMS  *BOWEL PROBLEMS  UNUSUAL RASH Items with * indicate a potential emergency and should be followed up as soon as possible.  Feel free to call the clinic you have any questions or concerns. The clinic phone number is [phone].  Dehydration, Adult Dehydration is a condition in which you do not have enough fluid or water in your body. It happens when you take in less fluid than you lose. Vital organs such as the kidneys, brain, and heart cannot function without a proper amount of fluids. Any loss of fluids from the body can cause dehydration.  Dehydration can range from mild to severe. This condition should be treated right away to help prevent it from becoming severe. CAUSES  This condition may be caused by:  Vomiting.  Diarrhea.  Excessive sweating, such as when exercising in hot or humid weather.  Not drinking enough fluid during strenuous exercise or during an illness.  Excessive urine output.  Fever.  Certain medicines. RISK FACTORS This condition is more likely to develop in:  People who are taking certain medicines that cause the body to lose excess fluid (diuretics).   People who have a chronic illness, such as diabetes, that may increase  urination.  Older adults.   People who live at high altitudes.   People who participate in endurance sports.  SYMPTOMS  Mild Dehydration  Thirst.  Dry lips.  Slightly dry mouth.  Dry, warm skin. Moderate Dehydration  Very dry mouth.   Muscle cramps.   Dark urine and decreased urine production.   Decreased tear production.   Headache.   Light-headedness, especially when you stand up from a sitting position.  Severe Dehydration  Changes in skin.   Cold and clammy skin.   Skin does not spring back quickly when lightly pinched and released.   Changes in body fluids.   Extreme thirst.   No tears.   Not able to sweat when body temperature is high, such as in hot weather.   Minimal urine production.   Changes in vital signs.   Rapid, weak pulse (more than 100 beats per minute when you are sitting still).   Rapid breathing.   Low blood pressure.   Other changes.   Sunken eyes.   Cold hands and feet.   Confusion.  Lethargy and difficulty being awakened.  Fainting (syncope).   Short-term weight loss.   Unconsciousness. DIAGNOSIS  This condition may be diagnosed based on your symptoms. You may also have tests to determine how severe your dehydration is. These tests may include:   Urine tests.   Blood tests.  TREATMENT  Treatment for this condition depends on the severity. Mild or moderate dehydration can often be treated at home. Treatment should be started right away. Do not wait until dehydration becomes severe. Severe dehydration needs to  be treated at the hospital. Treatment for Mild Dehydration  Drinking plenty of water to replace the fluid you have lost.   Replacing minerals in your blood (electrolytes) that you may have lost.  Treatment for Moderate Dehydration  Consuming oral rehydration solution (ORS). Treatment for Severe Dehydration  Receiving fluid through an IV tube.   Receiving electrolyte  solution through a feeding tube that is passed through your nose and into your stomach (nasogastric tube or NG tube).  Correcting any abnormalities in electrolytes. HOME CARE INSTRUCTIONS   Drink enough fluid to keep your urine clear or pale yellow.   Drink water or fluid slowly by taking small sips. You can also try sucking on ice cubes.  Have food or beverages that contain electrolytes. Examples include bananas and sports drinks.  Take over-the-counter and prescription medicines only as told by your health care provider.   Prepare ORS according to the manufacturer's instructions. Take sips of ORS every 5 minutes until your urine returns to normal.  If you have vomiting or diarrhea, continue to try to drink water, ORS, or both.   If you have diarrhea, avoid:   Beverages that contain caffeine.   Fruit juice.   Milk.   Carbonated soft drinks.  Do not take salt tablets. This can lead to the condition of having too much sodium in your body (hypernatremia).  SEEK MEDICAL CARE IF:  You cannot eat or drink without vomiting.  You have had moderate diarrhea during a period of more than 24 hours.  You have a fever. SEEK IMMEDIATE MEDICAL CARE IF:   You have extreme thirst.  You have severe diarrhea.  You have not urinated in 6-8 hours, or you have urinated only a small amount of very dark urine.  You have shriveled skin.  You are dizzy, confused, or both.   This information is not intended to replace advice given to you by your health care provider. Make sure you discuss any questions you have with your health care provider.   Document Released: 02/15/2005 Document Revised: 11/06/2014 Document Reviewed: 07/03/2014 Elsevier Interactive Patient Education [employer].

## 2015-03-14 ENCOUNTER — Telehealth: Payer: Self-pay | Admitting: Hematology and Oncology

## 2015-03-14 DIAGNOSIS — D801 Nonfamilial hypogammaglobulinemia: Secondary | ICD-10-CM | POA: Insufficient documentation

## 2015-03-14 LAB — IGG, IGA, IGM
IgA, Qn, Serum: 86 mg/dL (ref 64–422)
IgG, Qn, Serum: 396 mg/dL — ABNORMAL LOW (ref 700–1600)
IgM, Qn, Serum: 14 mg/dL — ABNORMAL LOW (ref 26–217)

## 2015-03-14 NOTE — Telephone Encounter (Signed)
Mailed out calendar of March 15 appt.

## 2015-03-14 NOTE — Progress Notes (Signed)
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   01/03/2014 Bone Marrow Biopsy Accession: [id number] Bone marrow biopsy is negative   01/04/2014 Imaging ECHO showed normal EF   01/04/2014 Procedure She has placement of port   01/09/2014 - 03/07/2014 Chemotherapy She was given treatment with bendamustine with rituximab. Treatment was stopped due to severe side-effects despite significant dose adjustment for cycle 2   01/18/2014 - 02/01/2014 Hospital Admission She was admitted to the hospital from Escherichia coli sepsis with multiorgan failure and brief episodes of intubation. She was discharged to skilled nursing facility   02/26/2014 Imaging PET/CT scan showed near complete remission   02/27/2014 Adverse Reaction Cycle 2 of treatment was resumed with drastic dose adjustment to bendamustine due to recent multi-organ failure   04/03/2014 -  Chemotherapy She is started on maintenance rituximab only.   04/09/2014 - 04/12/2014 Hospital Admission The patient was admitted to the hospital with urinary tract infection  and sepsis.   06/05/2014 Imaging PET CT scan showed complete response to Rx   02/12/2015 Imaging Ct scan showed no evidence of disease. It shows she has new pneumonia    INTERVAL HISTORY: Please see below for problem oriented charting.  she is seen prior to treatment. She had another episode of infection recently, recovering from urinary tract infection and pneumonia.  she denies new lymphadenopathy. The patient denies any recent signs or symptoms of bleeding such as spontaneous epistaxis, hematuria or hematochezia.  REVIEW OF SYSTEMS:   Constitutional: Denies fevers, chills or abnormal weight loss Eyes: Denies blurriness of vision Ears, nose, mouth, throat, and face: Denies mucositis or sore throat Respiratory: Denies cough, dyspnea or wheezes Cardiovascular: Denies palpitation, chest discomfort or lower extremity swelling Gastrointestinal:  Denies nausea, heartburn or change in bowel habits Skin: Denies abnormal skin rashes Lymphatics: Denies new lymphadenopathy Neurological:Denies numbness, tingling or new weaknesses Behavioral/Psych: Mood is stable, no new changes  All other systems were reviewed with the patient and are negative.  I have reviewed the past medical history, past surgical history, social history and family history with the patient and they are unchanged from previous note.  ALLERGIES:  is allergic to morphine and related; multaq; demerol; oysters; penicillins; and sulfa drugs cross reactors.  MEDICATIONS:  Current Outpatient Prescriptions  Medication Sig Dispense Refill  . acetaminophen (TYLENOL) 500 MG tablet Take 1,000 mg by mouth every 6 (six) hours as needed for moderate pain, fever or headache.    . albuterol (PROAIR HFA) 108 (90 BASE) MCG/ACT inhaler Inhale 2 puffs into the lungs every 6 (six) hours as needed for wheezing or shortness  of breath.    Marland Kitchen albuterol (PROVENTIL) (2.5 MG/3ML) 0.083% nebulizer solution Inhale 3 mLs into the lungs every 6 (six) hours as  needed for wheezing or shortness of breath.     . cefpodoxime (VANTIN) 100 MG tablet Take 200 mg by mouth 2 (two) times daily.    . Cholecalciferol (VITAMIN D) 2000 UNITS tablet Take 2,000 Units by mouth daily with lunch.    . conjugated estrogens (PREMARIN) vaginal cream Place 1 Applicatorful vaginally 3 (three) times a week. Monday, Wednesday, and Friday nights 42.5 g 4  . diltiazem (CARDIZEM CD) 120 MG 24 hr capsule Take 120 mg by mouth daily.     . diphenoxylate-atropine (LOMOTIL) 2.5-0.025 MG per tablet Take 1 tablet by mouth 4 (four) times daily as needed for diarrhea or loose stools. 90 tablet 0  . flecainide (TAMBOCOR) 100 MG tablet Take 50 mg by mouth 2 (two) times daily.    . Fluticasone-Salmeterol (ADVAIR) 250-50 MCG/DOSE AEPB Inhale 2 puffs into the lungs every 12 (twelve) hours.     Marland Kitchen levothyroxine (SYNTHROID, LEVOTHROID) 75 MCG tablet Take 225 mcg by mouth daily before breakfast.    . lidocaine-prilocaine (EMLA) cream Apply 1 application topically as needed. Apply to [facility] a Cath site at least one hour prior to needle stick as needed. 30 g 3  . Magnesium Oxide 500 MG TABS Take 500 mg by mouth daily.     . metFORMIN (GLUCOPHAGE-XR) 500 MG 24 hr tablet Take 500 mg by mouth 2 (two) times daily with a meal.    . montelukast (SINGULAIR) 10 MG tablet Take 10 mg by mouth daily.    . ondansetron (ZOFRAN) 8 MG tablet Take 8 mg by mouth every 8 (eight) hours as needed for nausea or vomiting.    . potassium chloride SA (K-DUR,KLOR-CON) 20 MEQ tablet Take 1 tablet (20 mEq total) by mouth daily. 30 tablet 5  . pravastatin (PRAVACHOL) 40 MG tablet Take 20 mg by mouth See admin instructions. Take 1/2 tablet (20 mg) ONLY on Monday, Tuesday, Wednesday, and Thursday Nights.    . predniSONE (DELTASONE) 50 MG tablet Take 1 tablet (50 mg total) by mouth daily with breakfast. 10 tablet 0  . prochlorperazine (COMPAZINE) 10 MG tablet Take 1 tablet (10 mg total) by mouth every 6 (six) hours as needed for nausea  (nausea). 30 tablet 6  . traMADol-acetaminophen (ULTRACET) 37.5-325 MG per tablet Take 1 tablet by mouth every 6 (six) hours as needed. (Patient taking differently: Take 1 tablet by mouth every 6 (six) hours as needed for moderate pain. ) 30 tablet 0  . triamterene-hydrochlorothiazide (MAXZIDE-25) 37.5-25 MG tablet Take 1 tablet by mouth daily as needed (for edema).     . vitamin B-12 (CYANOCOBALAMIN) 500 MCG tablet Take 500 mcg by mouth daily.    Marland Kitchen warfarin (COUMADIN) 5 MG tablet Take 2.5 mg by mouth daily.      No current facility-administered medications for this visit.    PHYSICAL EXAMINATION: ECOG PERFORMANCE STATUS: 1 - Symptomatic but completely ambulatory  Filed Vitals:   03/13/15 0930  BP: 96/67  Pulse: 106  Temp: 98.3 F (36.8 C)  Resp: 18   Filed Weights   03/13/15 0930  Weight: 134 lb 1.6 oz (60.827 kg)    GENERAL:alert, no distress and comfortable SKIN:  Note easy bruising. EYES: normal, Conjunctiva are pink and non-injected, sclera clear OROPHARYNX:no exudate, no erythema and lips, buccal mucosa, and tongue normal  NECK: supple, thyroid normal size, non-tender, without nodularity  LYMPH:  no palpable lymphadenopathy in the cervical, axillary or inguinal LUNGS: clear to auscultation and percussion with normal breathing effort HEART: regular rate & rhythm and no murmurs and no lower extremity edema ABDOMEN:abdomen soft, non-tender and normal bowel sounds Musculoskeletal:no cyanosis of digits and no clubbing  NEURO: alert & oriented x 3 with fluent speech, no focal motor/sensory deficits  LABORATORY DATA:  I have reviewed the data as listed    Component Value Date/Time   NA 139 03/13/2015 0920   NA 138 03/02/2015 1648   K 4.4 03/13/2015 0920   K 4.4 03/02/2015 1648   CL 104 03/02/2015 1648   CO2 27 03/13/2015 0920   CO2 24 03/02/2015 1648   GLUCOSE 100 03/13/2015 0920   GLUCOSE 171* 03/02/2015 1648   BUN 19.4 03/13/2015 0920   BUN 14 03/02/2015 1648    CREATININE 0.8 03/13/2015 0920   CREATININE 0.78 03/02/2015 1648   CALCIUM 9.8 03/13/2015 0920   CALCIUM 8.9 03/02/2015 1648   PROT 6.6 03/13/2015 0920   PROT 6.4* 03/02/2015 1648   ALBUMIN 3.3* 03/13/2015 0920   ALBUMIN 3.5 03/02/2015 1648   AST 16 03/13/2015 0920   AST 25 03/02/2015 1648   ALT 18 03/13/2015 0920   ALT 24 03/02/2015 1648   ALKPHOS 112 03/13/2015 0920   ALKPHOS 100 03/02/2015 1648   BILITOT 0.35 03/13/2015 0920   BILITOT 0.6 03/02/2015 1648   GFRNONAA >60 03/02/2015 1648   GFRAA >60 03/02/2015 1648    No results found for: SPEP, UPEP  Lab Results  Component Value Date   WBC 9.9 03/13/2015   NEUTROABS 8.2* 03/13/2015   HGB 9.9* 03/13/2015   HCT 30.1* 03/13/2015   MCV 85.1 03/13/2015   PLT 547* 03/13/2015      Chemistry      Component Value Date/Time   NA 139 03/13/2015 0920   NA 138 03/02/2015 1648   K 4.4 03/13/2015 0920   K 4.4 03/02/2015 1648   CL 104 03/02/2015 1648   CO2 27 03/13/2015 0920   CO2 24 03/02/2015 1648   BUN 19.4 03/13/2015 0920   BUN 14 03/02/2015 1648   CREATININE 0.8 03/13/2015 0920   CREATININE 0.78 03/02/2015 1648      Component Value Date/Time   CALCIUM 9.8 03/13/2015 0920   CALCIUM 8.9 03/02/2015 1648   ALKPHOS 112 03/13/2015 0920   ALKPHOS 100 03/02/2015 1648   AST 16 03/13/2015 0920   AST 25 03/02/2015 1648   ALT 18 03/13/2015 0920   ALT 24 03/02/2015 1648   BILITOT 0.35 03/13/2015 0920   BILITOT 0.6 03/02/2015 1648       ASSESSMENT & PLAN:  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   PET/CT scan in April 2016 showed complete response to treatment.  CT scan from 02/12/2015 showed no evidence of disease. We will continue treatment every other month for total of 2 years.    Anemia in chronic illness This is likely due to recent treatment and anemia of chronic disease. The patient denies recent history of  bleeding such as epistaxis, hematuria or hematochezia. She is asymptomatic from the anemia. I will observe for now.       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 She had recurrent sepsis and infection. She is recovering from treatment.  I recommend she start a trial of oral cranberry supplement to see if  she can reduce the frequency of recurrent UTI. Alternatively, I can discuss with the patient regarding the use of IVIG to prevent recurrent infection.  Long term current use of anticoagulant therapy  She is currently on Coumadin, managed by her cardiologist. She is concerned about using cranberry supplement while on warfarin. I recommend she discuss this with her cardiologist. If this is not possible , we can consider IVIG long-term treatment to reduce risk of recurrent infection.   Orders Placed This Encounter  Procedures  . IgG, IgA, IgM    Standing Status: Future     Number of Occurrences: 1     Standing Expiration Date: 04/16/2016   All questions were answered. The patient knows to call the clinic with any problems, questions or concerns. No barriers to learning was detected. I spent 20 minutes counseling the patient face to face. The total time spent in the appointment was 30 minutes and more than 50% was on counseling and review of test results     [facility], [place], MD 03/14/2015 2:07 PM

## 2015-03-14 NOTE — Assessment & Plan Note (Signed)
Patient received her first cycle of maintenance Rituxan chemotherapy on 04/03/2014.   PET/CT scan in April 2016 showed complete response to treatment.  CT scan from 02/12/2015 showed no evidence of disease. We will continue treatment every other month for total of 2 years.

## 2015-03-14 NOTE — Assessment & Plan Note (Signed)
This is likely due to recent treatment and anemia of chronic disease. The patient denies recent history of bleeding such as epistaxis, hematuria or hematochezia. She is asymptomatic from the anemia. I will observe for now.  

## 2015-03-14 NOTE — Assessment & Plan Note (Signed)
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 She had recurrent sepsis and infection. She is recovering from treatment.  I recommend she start a trial of oral cranberry supplement to see if she can reduce the frequency of recurrent UTI. Alternatively, I can discuss with the patient regarding the use of IVIG to prevent recurrent infection.

## 2015-03-14 NOTE — Assessment & Plan Note (Signed)
She is currently on Coumadin, managed by her cardiologist. She is concerned about using cranberry supplement while on warfarin. I recommend she discuss this with her cardiologist. If this is not possible , we can consider IVIG long-term treatment to reduce risk of recurrent infection.

## 2015-03-17 ENCOUNTER — Telehealth: Payer: Self-pay | Admitting: Hematology and Oncology

## 2015-03-17 NOTE — Telephone Encounter (Signed)
I reviewed her test results with the patient. She has acquired hypogammaglobulinemia from prior chemotherapy. This could be the explanation of her recurrent infectious episodes over the last 6 months. I shared with the patient potential treatment with IVIG. She will think about it and let me no.

## 2015-03-18 ENCOUNTER — Other Ambulatory Visit: Payer: Self-pay | Admitting: Hematology and Oncology

## 2015-03-18 ENCOUNTER — Telehealth: Payer: Self-pay | Admitting: *Deleted

## 2015-03-18 NOTE — Telephone Encounter (Signed)
Called patient with dr. Alvy Bimler instructions for immunoglobulin.  Will receive a call from a scheduler with specific times. For these dates.  Verbalized understanding and no further questions.

## 2015-03-18 NOTE — Telephone Encounter (Signed)
"  Would you let Dr. Alvy Bimler know I have decided to proceed with the Immunoglobulin therapy.  I cannot receive this treatment on January 24th, 25th or 30th due to other appointments.  Return number is [phone].

## 2015-03-18 NOTE — Telephone Encounter (Signed)
I placed POF for her to start 1/26 with no labs and no need to see me and then she sees me with labs on 2/23 before second Rx

## 2015-03-21 ENCOUNTER — Telehealth: Payer: Self-pay | Admitting: Hematology and Oncology

## 2015-03-21 DIAGNOSIS — Z7901 Long term (current) use of anticoagulants: Secondary | ICD-10-CM | POA: Diagnosis not present

## 2015-03-21 NOTE — Telephone Encounter (Signed)
Added IVIG for 1/26. Other appointments per 1/17 pof already on schedule. Spoke with patient re next appointment for 1/26 and patient will get new schedule then.

## 2015-03-23 ENCOUNTER — Inpatient Hospital Stay (HOSPITAL_COMMUNITY)
Admission: EM | Admit: 2015-03-23 | Discharge: 2015-03-26 | DRG: 690 | Disposition: A | Discharge status: Home / Self Care | Payer: Medicare Other | Attending: Family Medicine | Admitting: Family Medicine

## 2015-03-23 ENCOUNTER — Encounter (HOSPITAL_COMMUNITY): Payer: Self-pay | Admitting: Internal Medicine

## 2015-03-23 DIAGNOSIS — Z7901 Long term (current) use of anticoagulants: Secondary | ICD-10-CM

## 2015-03-23 DIAGNOSIS — E119 Type 2 diabetes mellitus without complications: Secondary | ICD-10-CM

## 2015-03-23 DIAGNOSIS — Z8572 Personal history of non-Hodgkin lymphomas: Secondary | ICD-10-CM | POA: Diagnosis not present

## 2015-03-23 DIAGNOSIS — Z8249 Family history of ischemic heart disease and other diseases of the circulatory system: Secondary | ICD-10-CM

## 2015-03-23 DIAGNOSIS — D638 Anemia in other chronic diseases classified elsewhere: Secondary | ICD-10-CM | POA: Diagnosis present

## 2015-03-23 DIAGNOSIS — E785 Hyperlipidemia, unspecified: Secondary | ICD-10-CM | POA: Diagnosis present

## 2015-03-23 DIAGNOSIS — I10 Essential (primary) hypertension: Secondary | ICD-10-CM | POA: Diagnosis present

## 2015-03-23 DIAGNOSIS — R112 Nausea with vomiting, unspecified: Secondary | ICD-10-CM | POA: Diagnosis present

## 2015-03-23 DIAGNOSIS — H9190 Unspecified hearing loss, unspecified ear: Secondary | ICD-10-CM | POA: Diagnosis present

## 2015-03-23 DIAGNOSIS — Z9049 Acquired absence of other specified parts of digestive tract: Secondary | ICD-10-CM | POA: Diagnosis not present

## 2015-03-23 DIAGNOSIS — Z823 Family history of stroke: Secondary | ICD-10-CM | POA: Diagnosis not present

## 2015-03-23 DIAGNOSIS — B952 Enterococcus as the cause of diseases classified elsewhere: Secondary | ICD-10-CM | POA: Diagnosis present

## 2015-03-23 DIAGNOSIS — D801 Nonfamilial hypogammaglobulinemia: Secondary | ICD-10-CM | POA: Diagnosis not present

## 2015-03-23 DIAGNOSIS — R197 Diarrhea, unspecified: Secondary | ICD-10-CM | POA: Diagnosis not present

## 2015-03-23 DIAGNOSIS — D72829 Elevated white blood cell count, unspecified: Secondary | ICD-10-CM

## 2015-03-23 DIAGNOSIS — C8591 Non-Hodgkin lymphoma, unspecified, lymph nodes of head, face, and neck: Secondary | ICD-10-CM

## 2015-03-23 DIAGNOSIS — N390 Urinary tract infection, site not specified: Secondary | ICD-10-CM | POA: Diagnosis not present

## 2015-03-23 DIAGNOSIS — E1169 Type 2 diabetes mellitus with other specified complication: Secondary | ICD-10-CM | POA: Diagnosis present

## 2015-03-23 DIAGNOSIS — Z79899 Other long term (current) drug therapy: Secondary | ICD-10-CM

## 2015-03-23 DIAGNOSIS — Z90710 Acquired absence of both cervix and uterus: Secondary | ICD-10-CM

## 2015-03-23 DIAGNOSIS — I480 Paroxysmal atrial fibrillation: Secondary | ICD-10-CM | POA: Diagnosis present

## 2015-03-23 DIAGNOSIS — I482 Chronic atrial fibrillation: Secondary | ICD-10-CM | POA: Diagnosis present

## 2015-03-23 DIAGNOSIS — E039 Hypothyroidism, unspecified: Secondary | ICD-10-CM | POA: Diagnosis present

## 2015-03-23 DIAGNOSIS — E038 Other specified hypothyroidism: Secondary | ICD-10-CM

## 2015-03-23 LAB — CBC WITH DIFFERENTIAL/PLATELET
BASOS ABS: 0 10*3/uL (ref 0.0–0.1)
BASOS ABS: 0 10*3/uL (ref 0.0–0.1)
BASOS PCT: 0 %
Basophils Relative: 0 %
EOS ABS: 0 10*3/uL (ref 0.0–0.7)
EOS ABS: 0 10*3/uL (ref 0.0–0.7)
EOS PCT: 0 %
Eosinophils Relative: 0 %
HCT: 32.5 % — ABNORMAL LOW (ref 36.0–46.0)
HEMATOCRIT: 33.5 % — AB (ref 36.0–46.0)
HEMOGLOBIN: 10.4 g/dL — AB (ref 12.0–15.0)
HEMOGLOBIN: 10.4 g/dL — AB (ref 12.0–15.0)
LYMPHS ABS: 0.3 10*3/uL — AB (ref 0.7–4.0)
LYMPHS PCT: 2 %
LYMPHS PCT: 3 %
Lymphs Abs: 0.2 10*3/uL — ABNORMAL LOW (ref 0.7–4.0)
MCH: 28.1 pg (ref 26.0–34.0)
MCH: 28 pg (ref 26.0–34.0)
MCHC: 32 g/dL (ref 30.0–36.0)
MCHC: 31 g/dL (ref 30.0–36.0)
MCV: 87.8 fL (ref 78.0–100.0)
MCV: 90.3 fL (ref 78.0–100.0)
Monocytes Absolute: 0.4 10*3/uL (ref 0.1–1.0)
Monocytes Absolute: 0.2 10*3/uL (ref 0.1–1.0)
Monocytes Relative: 4 %
Monocytes Relative: 2 %
NEUTROS ABS: 8 10*3/uL — AB (ref 1.7–7.7)
NEUTROS PCT: 94 %
Neutro Abs: 9.9 10*3/uL — ABNORMAL HIGH (ref 1.7–7.7)
Neutrophils Relative %: 95 %
PLATELETS: 234 10*3/uL (ref 150–400)
Platelets: 232 10*3/uL (ref 150–400)
RBC: 3.7 MIL/uL — AB (ref 3.87–5.11)
RBC: 3.71 MIL/uL — ABNORMAL LOW (ref 3.87–5.11)
RDW: 15.8 % — ABNORMAL HIGH (ref 11.5–15.5)
RDW: 16.1 % — AB (ref 11.5–15.5)
WBC: 10.6 10*3/uL — AB (ref 4.0–10.5)
WBC: 8.5 10*3/uL (ref 4.0–10.5)

## 2015-03-23 LAB — COMPREHENSIVE METABOLIC PANEL
ALBUMIN: 3.6 g/dL (ref 3.5–5.0)
ALK PHOS: 69 U/L (ref 38–126)
ALT: 16 U/L (ref 14–54)
ALT: 19 U/L (ref 14–54)
AST: 37 U/L (ref 15–41)
AST: 26 U/L (ref 15–41)
Albumin: 3.6 g/dL (ref 3.5–5.0)
Alkaline Phosphatase: 72 U/L (ref 38–126)
Anion gap: 13 (ref 5–15)
Anion gap: 11 (ref 5–15)
BILIRUBIN TOTAL: 1 mg/dL (ref 0.3–1.2)
BILIRUBIN TOTAL: 0.7 mg/dL (ref 0.3–1.2)
BUN: 17 mg/dL (ref 6–20)
BUN: 16 mg/dL (ref 6–20)
CHLORIDE: 105 mmol/L (ref 101–111)
CO2: 20 mmol/L — ABNORMAL LOW (ref 22–32)
CO2: 21 mmol/L — ABNORMAL LOW (ref 22–32)
CREATININE: 0.7 mg/dL (ref 0.44–1.00)
Calcium: 8.8 mg/dL — ABNORMAL LOW (ref 8.9–10.3)
Calcium: 8.6 mg/dL — ABNORMAL LOW (ref 8.9–10.3)
Chloride: 105 mmol/L (ref 101–111)
Creatinine, Ser: 0.79 mg/dL (ref 0.44–1.00)
GFR calc Af Amer: >60 mL/min (ref >60)
GLUCOSE: 151 mg/dL — AB (ref 65–99)
Glucose, Bld: 168 mg/dL — ABNORMAL HIGH (ref 65–99)
POTASSIUM: 4.9 mmol/L (ref 3.5–5.1)
Potassium: 3.5 mmol/L (ref 3.5–5.1)
Sodium: 138 mmol/L (ref 135–145)
Sodium: 137 mmol/L (ref 135–145)
TOTAL PROTEIN: 6.2 g/dL — AB (ref 6.5–8.1)
TOTAL PROTEIN: 6.3 g/dL — AB (ref 6.5–8.1)

## 2015-03-23 LAB — URINALYSIS, ROUTINE W REFLEX MICROSCOPIC
BILIRUBIN URINE: NEGATIVE
GLUCOSE, UA: NEGATIVE mg/dL
HGB URINE DIPSTICK: NEGATIVE
KETONES UR: NEGATIVE mg/dL
Nitrite: NEGATIVE
PH: 6 (ref 5.0–8.0)
PROTEIN: NEGATIVE mg/dL
Specific Gravity, Urine: 1.019 (ref 1.005–1.030)

## 2015-03-23 LAB — TSH: TSH: 0.208 u[IU]/mL — AB (ref 0.350–4.500)

## 2015-03-23 LAB — URINE MICROSCOPIC-ADD ON

## 2015-03-23 LAB — PROTIME-INR
INR: 1.48 (ref 0.00–1.49)
PROTHROMBIN TIME: 18 s — AB (ref 11.6–15.2)

## 2015-03-23 LAB — MAGNESIUM: MAGNESIUM: 1.5 mg/dL — AB (ref 1.7–2.4)

## 2015-03-23 LAB — GLUCOSE, CAPILLARY: Glucose-Capillary: 137 mg/dL — ABNORMAL HIGH (ref 65–99)

## 2015-03-23 LAB — PHOSPHORUS: PHOSPHORUS: 2.6 mg/dL (ref 2.5–4.6)

## 2015-03-23 MED ORDER — PROCHLORPERAZINE MALEATE 10 MG PO TABS: 10.0000 mg | ORAL_TABLET | Freq: Four times a day (QID) | ORAL | Status: DC | PRN

## 2015-03-23 MED ORDER — MOMETASONE FURO-FORMOTEROL FUM 100-5 MCG/ACT IN AERO
2.0000 | INHALATION_SPRAY | Freq: Two times a day (BID) | RESPIRATORY_TRACT | Status: DC
  Administered 2015-03-23 – 2015-03-26 (×6): 2 via RESPIRATORY_TRACT
  Filled 2015-03-23 (×2): qty 8.8

## 2015-03-23 MED ORDER — SODIUM CHLORIDE 0.9 % IV BOLUS (SEPSIS)
500.0000 mL | Freq: Once | INTRAVENOUS | Status: AC
  Administered 2015-03-23: 500 mL via INTRAVENOUS

## 2015-03-23 MED ORDER — WARFARIN - PHARMACIST DOSING INPATIENT: Freq: Every day | Status: DC

## 2015-03-23 MED ORDER — ACETAMINOPHEN 500 MG PO TABS: 1000.0000 mg | ORAL_TABLET | Freq: Four times a day (QID) | ORAL | Status: DC | PRN

## 2015-03-23 MED ORDER — VITAMIN D 1000 UNITS PO TABS
2000.0000 [IU] | ORAL_TABLET | Freq: Every day | ORAL | Status: DC
Start: 2015-03-23 — End: 2015-03-26
  Administered 2015-03-23 – 2015-03-26 (×4): 2000 [IU] via ORAL
  Filled 2015-03-23 (×4): qty 2

## 2015-03-23 MED ORDER — FLECAINIDE ACETATE 50 MG PO TABS
50.0000 mg | ORAL_TABLET | Freq: Two times a day (BID) | ORAL | Status: DC
  Administered 2015-03-23 – 2015-03-26 (×6): 50 mg via ORAL
  Filled 2015-03-23 (×7): qty 1

## 2015-03-23 MED ORDER — SODIUM CHLORIDE 0.9 % IV SOLN
INTRAVENOUS | Status: DC
  Administered 2015-03-23 – 2015-03-25 (×3): via INTRAVENOUS

## 2015-03-23 MED ORDER — PRAVASTATIN SODIUM 20 MG PO TABS
20.0000 mg | ORAL_TABLET | ORAL | Status: DC
  Administered 2015-03-24 – 2015-03-25 (×2): 20 mg via ORAL
  Filled 2015-03-23 (×3): qty 1

## 2015-03-23 MED ORDER — VITAMIN B-12 1000 MCG PO TABS
500.0000 ug | ORAL_TABLET | Freq: Every day | ORAL | Status: DC
  Administered 2015-03-23 – 2015-03-26 (×4): 500 ug via ORAL
  Filled 2015-03-23 (×4): qty 0.5

## 2015-03-23 MED ORDER — WARFARIN SODIUM 5 MG PO TABS
5.0000 mg | ORAL_TABLET | Freq: Once | ORAL | Status: AC
  Administered 2015-03-23: 5 mg via ORAL
  Filled 2015-03-23: qty 1

## 2015-03-23 MED ORDER — ALBUTEROL SULFATE HFA 108 (90 BASE) MCG/ACT IN AERS: 2.0000 | INHALATION_SPRAY | Freq: Four times a day (QID) | RESPIRATORY_TRACT | Status: DC | PRN

## 2015-03-23 MED ORDER — TRIAMTERENE-HCTZ 37.5-25 MG PO TABS
1.0000 | ORAL_TABLET | Freq: Every day | ORAL | Status: DC | PRN
  Filled 2015-03-23: qty 1

## 2015-03-23 MED ORDER — CIPROFLOXACIN IN D5W 400 MG/200ML IV SOLN
400.0000 mg | Freq: Two times a day (BID) | INTRAVENOUS | Status: DC
  Administered 2015-03-23 – 2015-03-24 (×4): 400 mg via INTRAVENOUS
  Filled 2015-03-23 (×5): qty 200

## 2015-03-23 MED ORDER — ONDANSETRON HCL 4 MG/2ML IJ SOLN
4.0000 mg | Freq: Four times a day (QID) | INTRAMUSCULAR | Status: DC | PRN
  Administered 2015-03-24: 4 mg via INTRAVENOUS
  Filled 2015-03-23: qty 2

## 2015-03-23 MED ORDER — MONTELUKAST SODIUM 10 MG PO TABS
10.0000 mg | ORAL_TABLET | Freq: Every day | ORAL | Status: DC
  Administered 2015-03-23 – 2015-03-26 (×4): 10 mg via ORAL
  Filled 2015-03-23 (×4): qty 1

## 2015-03-23 MED ORDER — ONDANSETRON HCL 4 MG PO TABS
4.0000 mg | ORAL_TABLET | Freq: Four times a day (QID) | ORAL | Status: DC | PRN
  Administered 2015-03-24: 4 mg via ORAL
  Filled 2015-03-23: qty 1

## 2015-03-23 MED ORDER — METFORMIN HCL ER 500 MG PO TB24
500.0000 mg | ORAL_TABLET | Freq: Two times a day (BID) | ORAL | Status: DC
  Administered 2015-03-23 – 2015-03-26 (×6): 500 mg via ORAL
  Filled 2015-03-23 (×9): qty 1

## 2015-03-23 MED ORDER — DILTIAZEM HCL ER COATED BEADS 120 MG PO CP24
120.0000 mg | ORAL_CAPSULE | Freq: Every day | ORAL | Status: DC
  Administered 2015-03-23 – 2015-03-26 (×4): 120 mg via ORAL
  Filled 2015-03-23 (×4): qty 1

## 2015-03-23 MED ORDER — ONDANSETRON HCL 4 MG/2ML IJ SOLN: 4.0000 mg | Freq: Once | INTRAMUSCULAR | Status: DC

## 2015-03-23 MED ORDER — TRAMADOL-ACETAMINOPHEN 37.5-325 MG PO TABS: 1.0000 | ORAL_TABLET | Freq: Four times a day (QID) | ORAL | Status: DC | PRN

## 2015-03-23 MED ORDER — ALBUTEROL SULFATE (2.5 MG/3ML) 0.083% IN NEBU: 3.0000 mL | INHALATION_SOLUTION | Freq: Four times a day (QID) | RESPIRATORY_TRACT | Status: DC | PRN

## 2015-03-23 MED ORDER — MAGNESIUM OXIDE 400 (241.3 MG) MG PO TABS
400.0000 mg | ORAL_TABLET | Freq: Every day | ORAL | Status: DC
Start: 2015-03-23 — End: 2015-03-26
  Administered 2015-03-23 – 2015-03-26 (×4): 400 mg via ORAL
  Filled 2015-03-23 (×4): qty 1

## 2015-03-23 MED ORDER — LEVOTHYROXINE SODIUM 25 MCG PO TABS
225.0000 ug | ORAL_TABLET | Freq: Every day | ORAL | Status: DC
  Administered 2015-03-24 – 2015-03-26 (×3): 225 ug via ORAL
  Filled 2015-03-23 (×4): qty 1

## 2015-03-23 NOTE — ED Provider Notes (Signed)
CSN: [id number]     Arrival date & time 03/23/15  0809 History   First MD Initiated Contact with Patient 03/23/15 [phone]     Chief Complaint  Patient presents with  . Nausea  . Emesis  . Diarrhea      Patient is a 75 y.o. female presenting with vomiting and diarrhea. The history is provided by the patient.  Emesis Severity:  Moderate Associated symptoms: chills and diarrhea   Associated symptoms: no abdominal pain   Diarrhea Associated symptoms: chills and vomiting   Associated symptoms: no abdominal pain and no fever    patient states that she began to feel little ill last night around 2 in the morning she began having nausea vomiting and diarrhea. She states she's had some chills. She is not a good appetite. She has been having some dysuria and states she just finished up and about urinary tract infection. She states she's been on several different antibiotics. No blood in the emesis or stool. 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 She is due to start IVIG infusions.  History reviewed. No pertinent past medical history. Past Surgical History  Procedure Laterality Date  . Appendectomy  1953  . Partial hysterectomy  1972  . Cholecystectomy  1993  . Back surgery      x2  . Cataract extraction    . Cardiac catheterization  10/10/2008    Nonobstructive CAD  . Abdominal hysterectomy  1972    TAH  . Foot surgery    . Tonsillectomy    . Pubovag repair with sling    . Skin tag removal  2012    leg  . Cardioversion  04/13/2011    Procedure: CARDIOVERSION;  Surgeon: Laverda Page, MD;  Location: [facility];  Service: Cardiovascular;  Laterality: N/A;  . Eye surgery      /w IOL, post cataracts removed   . Mass biopsy Left 12/07/2013    Procedure: EXCISIONAL BIOPSY LEFT NECK MASS ;  Surgeon: Jerrell Belfast, MD;  Location[facility] OR;  Service: ENT;  Laterality: Left;   Family History  Problem Relation Age of Onset  . Stroke Father     at 10  . Heart attack  Father     at 53  . Hypertension Father   . Heart disease Mother   . Arthritis Mother   . Breast cancer Mother     Age 38  . Heart failure Mother   . Heart attack Brother   . Hypertension Brother   . Diabetes Sister    Social History  Substance Use Topics  . Smoking status: Never Smoker   . Smokeless tobacco: Never Used  . Alcohol Use: No   OB History    Gravida Para Term Preterm AB TAB SAB Ectopic Multiple Living   2 2 2       2      Review of Systems  Constitutional: Positive for chills, appetite change and fatigue. Negative for fever.  Respiratory: Negative for chest tightness.   Cardiovascular: Negative for chest pain.  Gastrointestinal: Positive for nausea, vomiting and diarrhea. Negative for abdominal pain and blood in stool.  Genitourinary: Positive for dysuria.  Musculoskeletal: Negative for back pain.  Skin: Negative for wound.  Neurological: Negative for light-headedness.  Hematological: Negative for adenopathy.      Allergies  Morphine and related; Multaq; Demerol; Oysters; Penicillins; and Sulfa drugs cross reactors  Home Medications   Prior to Admission medications   Medication Sig  Start Date End Date Taking? Authorizing Provider  acetaminophen (TYLENOL) 500 MG tablet Take 1,000 mg by mouth every 6 (six) hours as needed for moderate pain, fever or headache.   Yes Historical Provider, MD  albuterol (PROAIR HFA) 108 (90 BASE) MCG/ACT inhaler Inhale 2 puffs into the lungs every 6 (six) hours as needed for wheezing or shortness of breath.   Yes Historical Provider, MD  albuterol (PROVENTIL) (2.5 MG/3ML) 0.083% nebulizer solution Inhale 3 mLs into the lungs every 6 (six) hours as needed for wheezing or shortness of breath.  02/27/15  Yes Historical Provider, MD  Cholecalciferol (VITAMIN D) 2000 UNITS tablet Take 2,000 Units by mouth daily with lunch.   Yes Historical Provider, MD  conjugated estrogens (PREMARIN) vaginal cream Place 1 Applicatorful vaginally 3  (three) times a week. Monday, Wednesday, and Friday nights 01/13/15  Yes Terrance Mass, MD  diltiazem (CARDIZEM CD) 120 MG 24 hr capsule Take 120 mg by mouth daily.  06/07/11  Yes Peter M Martinique, MD  diphenoxylate-atropine (LOMOTIL) 2.5-0.025 MG per tablet Take 1 tablet by mouth 4 (four) times daily as needed for diarrhea or loose stools. 08/08/14  Yes Heath Lark, MD  flecainide (TAMBOCOR) 100 MG tablet Take 50 mg by mouth 2 (two) times daily.   Yes Historical Provider, MD  Fluticasone-Salmeterol (ADVAIR) 250-50 MCG/DOSE AEPB Inhale 2 puffs into the lungs every 12 (twelve) hours.    Yes Historical Provider, MD  levothyroxine (SYNTHROID, LEVOTHROID) 75 MCG tablet Take 225 mcg by mouth daily before breakfast.   Yes Historical Provider, MD  lidocaine-prilocaine (EMLA) cream Apply 1 application topically as needed. Apply to [facility] a Cath site at least one hour prior to needle stick as needed. 01/04/14  Yes Heath Lark, MD  Magnesium Oxide 500 MG TABS Take 500 mg by mouth daily.    Yes Historical Provider, MD  metFORMIN (GLUCOPHAGE-XR) 500 MG 24 hr tablet Take 500 mg by mouth 2 (two) times daily with a meal.   Yes Historical Provider, MD  montelukast (SINGULAIR) 10 MG tablet Take 10 mg by mouth daily.   Yes Historical Provider, MD  ondansetron (ZOFRAN) 8 MG tablet Take 8 mg by mouth every 8 (eight) hours as needed for nausea or vomiting.   Yes Historical Provider, MD  potassium chloride SA (K-DUR,KLOR-CON) 20 MEQ tablet Take 1 tablet (20 mEq total) by mouth daily. 12/22/11  Yes Peter M Martinique, MD  pravastatin (PRAVACHOL) 40 MG tablet Take 20 mg by mouth See admin instructions. Take 1/2 tablet (20 mg) ONLY on Monday, Tuesday, Wednesday, and Thursday Nights.   Yes Historical Provider, MD  prochlorperazine (COMPAZINE) 10 MG tablet Take 1 tablet (10 mg total) by mouth every 6 (six) hours as needed for nausea (nausea). 11/13/14  Yes Heath Lark, MD  traMADol-acetaminophen (ULTRACET) 37.5-325 MG per tablet Take 1 tablet  by mouth every 6 (six) hours as needed. Patient taking differently: Take 1 tablet by mouth every 6 (six) hours as needed for moderate pain.  02/01/14  Yes Venetia Maxon Rama, MD  triamterene-hydrochlorothiazide (MAXZIDE-25) 37.5-25 MG tablet Take 1 tablet by mouth daily as needed (for edema).    Yes Historical Provider, MD  vitamin B-12 (CYANOCOBALAMIN) 500 MCG tablet Take 500 mcg by mouth daily.   Yes Historical Provider, MD  warfarin (COUMADIN) 3 MG tablet Take 3 mg by mouth daily.   Yes Historical Provider, MD  predniSONE (DELTASONE) 50 MG tablet Take 1 tablet (50 mg total) by mouth daily with breakfast. Patient not taking:  Reported on 03/23/2015 02/13/15   Heath Lark, MD   BP 114/56 mmHg  Pulse 114  Temp(Src) 98.8 F (37.1 C) (Oral)  Resp 20  Ht 5\' 6"  (1.676 m)  Wt 144 lb 1.6 oz (65.363 kg)  BMI 23.27 kg/m2  SpO2 95% Physical Exam  Constitutional: She appears well-developed.  HENT:  Head: Atraumatic.  Eyes: EOM are normal.  Cardiovascular:  Mild tachycardia  Pulmonary/Chest: Effort normal.  Abdominal: Soft. There is no tenderness.  Musculoskeletal: Normal range of motion.  Neurological: She is alert.  Skin: Skin is warm.    ED Course  Procedures (including critical care time) Labs Review Labs Reviewed  URINALYSIS, ROUTINE W REFLEX MICROSCOPIC (NOT AT [facility]) - Abnormal; Notable for the following:    APPearance HAZY (*)    Leukocytes, UA SMALL (*)    All other components within normal limits  COMPREHENSIVE METABOLIC PANEL - Abnormal; Notable for the following:    CO2 20 (*)    Glucose, Bld 168 (*)    Calcium 8.8 (*)    Total Protein 6.2 (*)    All other components within normal limits  CBC WITH DIFFERENTIAL/PLATELET - Abnormal; Notable for the following:    WBC 10.6 (*)    RBC 3.70 (*)    Hemoglobin 10.4 (*)    HCT 32.5 (*)    RDW 15.8 (*)    Neutro Abs 9.9 (*)    Lymphs Abs 0.2 (*)    All other components within normal limits  PROTIME-INR - Abnormal; Notable for  the following:    Prothrombin Time 18.0 (*)    All other components within normal limits  URINE MICROSCOPIC-ADD ON - Abnormal; Notable for the following:    Squamous Epithelial / LPF 0-5 (*)    Bacteria, UA MANY (*)    All other components within normal limits  COMPREHENSIVE METABOLIC PANEL - Abnormal; Notable for the following:    Glucose, Bld 151 (*)    Total Protein 6.3 (*)    All other components within normal limits  CBC WITH DIFFERENTIAL/PLATELET - Abnormal; Notable for the following:    RBC 3.71 (*)    Hemoglobin 10.4 (*)    HCT 33.5 (*)    RDW 16.1 (*)    Neutro Abs 8.0 (*)    Lymphs Abs 0.3 (*)    All other components within normal limits  MAGNESIUM - Abnormal; Notable for the following:    Magnesium 1.5 (*)    All other components within normal limits  URINE CULTURE  C DIFFICILE QUICK SCREEN W PCR REFLEX  URINE CULTURE  PHOSPHORUS  TSH  HEMOGLOBIN A1C    Imaging Review No results found. I have personally reviewed and evaluated these images and lab results as part of my medical decision-making.   EKG Interpretation None      MDM   Final diagnoses:  UTI (lower urinary tract infection)  Nausea vomiting and diarrhea  Acquired hypogammaglobulinemia ([facility])    Patient with nausea vomiting diarrhea. Also has UTI. Will be unlikely to be able to keep oral medications down at home and will admit to internal medicine. Has a history of acquired hypogammaglobulinemia due to cancer chemotherapy.    Davonna Belling, MD 03/23/15 [phone]

## 2015-03-23 NOTE — ED Notes (Signed)
Bed: [id number] Expected date:  Expected time:  Means of arrival:  Comments: EMS- 75 yo N/V, recent UTI

## 2015-03-23 NOTE — ED Notes (Signed)
Floor notified of bed

## 2015-03-23 NOTE — Progress Notes (Signed)
ANTICOAGULATION CONSULT NOTE - Initial Consult  Pharmacy Consult for warfarin  Indication: atrial fibrillation  Allergies  Allergen Reactions  . Morphine And Related Anaphylaxis and Other (See Comments)    Pt states she stopped breathing post op- "went into resp. arrest"  . Multaq [Dronedarone] Other (See Comments)    Reaction:  Blood in urine and elevated liver enzymes.   . Demerol Nausea And Vomiting  . Oysters [Shellfish Allergy] Rash  . Penicillins Itching and Rash    Has patient had a PCN reaction causing immediate rash, facial/tongue/throat swelling, SOB or lightheadedness with hypotension: No Has patient had a PCN reaction causing severe rash involving mucus membranes or skin necrosis: Yes Has patient had a PCN reaction that required hospitalization No Has patient had a PCN reaction occurring within the last 10 years: No If all of the above answers are "NO", then may proceed with Cephalosporin use.   [facility] Cross Reactors Rash    Patient Measurements: Height: 5\' 6"  (167.6 cm) Weight: 144 lb 1.6 oz (65.363 kg) IBW/kg (Calculated) : 59.3  Vital Signs: Temp: 98.6 F (37 C) (01/22 0812) Temp Source: Oral (01/22 0812) BP: 107/59 mmHg (01/22 1321) Pulse Rate: 111 (01/22 1321)  Labs:  Recent Labs  03/23/15 0839  HGB 10.4*  HCT 32.5*  PLT 234  LABPROT 18.0*  INR 1.48  CREATININE 0.79    Estimated Creatinine Clearance: 56 mL/min (by C-G formula based on Cr of 0.79).   Medical History: No past medical history on file.  Medications:  Scheduled:  . cholecalciferol  2,000 Units Oral Q lunch  . ciprofloxacin  400 mg Intravenous Q12H  . diltiazem  120 mg Oral Daily  . flecainide  50 mg Oral BID  . [START ON 03/24/2015] levothyroxine  225 mcg Oral QAC breakfast  . magnesium oxide  400 mg Oral Daily  . metFORMIN  500 mg Oral BID WC  . mometasone-formoterol  2 puff Inhalation BID  . montelukast  10 mg Oral Daily  . [START ON 03/24/2015] pravastatin  20 mg  Oral Once per day on Mon Tue Wed Thu  . vitamin B-12  500 mcg Oral Daily   Infusions:  . sodium chloride      Assessment: 75 yo female presented to ER with CC NVD. To continue warfarin for Afib as inpatient per pharmacy dosing. Home regimen prior to admission was reportedly 3mg  daily with last dose at home being 1/21. INR subtherapeutic on admission  Goal of Therapy:  INR 2-3   Plan:  1) Warfarin 5mg  tonight 2) Daily INR   Adrian Saran, PharmD, BCPS Pager [phone] 03/23/2015 1:54 PM

## 2015-03-23 NOTE — H&P (Signed)
[facility] History and Physical  Patty [name] [id number] DOB: Sep 09, 1940 DOA: 03/23/2015  Referring physician: ER physician: Dr. Davonna Belling PCP: Horatio Pel, MD  Chief Complaint: nausea, vomiting, diarrhea   HPI:  75 year old female with past medical history of malignant lymphoma, acquired hypogammaglobulinemia (under Dr. Calton Dach care), hypertension, dyslipidemia, atrial fibrillation on anticoagulation with coumadin who presented to [facility] ED with reports of nausea, vomiting, diarrhea since last night. She has some abdominal cramping but no fevers. No blood in stool. No reports of chest pain, palpitations, shortness of breath. No reports of lightheadedness or loss of consciousness. Pt did reports she recently was on abx for UTI. She is scheduled to get IVIG 1/26.  In ED, BP was 106/50, HR 105, RR 18, T max 98.6 F and oxygen saturation was 94% on room air. Blood work showed WBC count of 10.6, hemoglobin of 10.4. She was nauseas and did not tolerate po intake in ED so [facility] asked to admit for further evaluation of GI related symptoms.    Assessment & Plan    Principal Problem: Nausea vomiting and diarrhea - Possibly related to her chronic illness versus viral gastroenteritis or C.diff - Check stool for C.diff - Provide supportive care with IV fluids, antiemetics as needed - Advance diet if tolerated   Active Problems:   UTI (urinary tract infection) / Leukocytosis - UA showed small leukocytes and many bacteria - Started empiric cipro - Follow up urine culture results     Chronic atrial fibrillation ([place]) / Long term current use of anticoagulant therapy - CHADS vasc score 3 - On anticoagulation with coumadin - Rate controlled with flecainide and Cardizem     Hypothyroidism - Continue synthroid - Check TSH    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  - PET/CT scan in April 2016 showed complete response to treatment. - CT scan from 02/12/2015 showed no evidence of disease. - Continue treatment every other month for total of 2 years.    Anemia in chronic illness - Due ot history of lymphoma - Hemoglobin stable     Acquired hypogammaglobulinemia (HCC) - IVIG due 1/26 - Managed by Dr. Alvy Bimler     Controlled type 2 diabetes mellitus without complication, without long-term current use of insulin ([place]) - Resume metformin     Dyslipidemia due to diabetes mellitus type 2 - Continue Pravachol   DVT prophylaxis:  - SCD's bilaterally   Radiological Exams on Admission: No results found.   Code Status: Full Family Communication: Plan of care discussed with the patient  Disposition Plan: Admit for further evaluation, medical floor   Leisa Lenz, MD  [facility] Pager [phone]  Time spent in minutes: 75 minutes  Review of Systems:  Constitutional: Negative for fever, chills and malaise/fatigue. Negative for diaphoresis.  HENT: Negative for hearing loss, ear pain, nosebleeds, congestion, sore throat, neck pain, tinnitus and ear discharge.   Eyes: Negative for blurred vision, double vision, photophobia, pain, discharge and redness.  Respiratory: Negative for cough, hemoptysis, sputum production, shortness of breath, wheezing and stridor.   Cardiovascular: Negative for chest pain, palpitations, orthopnea, claudication and leg swelling.  Gastrointestinal: per HPI Genitourinary: Negative for dysuria, urgency, frequency, hematuria and flank pain.  Musculoskeletal: Negative for myalgias, back pain, joint pain and falls.  Skin: Negative for itching and rash.  Neurological: Negative for dizziness and weakness. Negative for tingling, tremors, sensory change,  speech change, focal weakness, loss of consciousness and headaches.  Endo/Heme/Allergies: Negative for environmental allergies and polydipsia. Does not bruise/bleed  easily.  Psychiatric/Behavioral: Negative for suicidal ideas. The patient is not nervous/anxious.      Past Medical History  Diagnosis Date  . Asthma 06/15/2010  . Hypothyroidism 06/15/2010  . Hyperlipemia 06/15/2010  . Atrial fibrillation ([place]) 05/28/2010    Managed with rate control and coumadin  . Long-term (current) use of anticoagulants   . ACE-inhibitor cough   . DI (detrusor instability)   . Fibroid   . Rectocele   . Atrophic vaginitis   . Cancer [facility]) 2008    Colon polyp-early adenoCA  . Diabetes mellitus     Type 2  . Peripheral neuropathy ([place])   . Hypertension   . Dysrhythmia     cardioversion - 2012  . Complication of anesthesia 1980's    post anesth.- states she "went into resp. arrest" , but then remarked that she thought maybe they gave her too much medicine (morphine)   . Shortness of breath     sometimes   . 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 Anemia   . 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 CPAP (continuous positive airway pressure) dependence   . GERD (gastroesophageal reflux disease)   . Hearing loss   . Sleep apnea     uses CPAP everynight- last study in [place] 5 yrs. or more   . Severe sepsis with septic shock ([place]) 01/21/2014  . E coli bacteremia 01/20/2014  . 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 Appendectomy  1953  . Partial hysterectomy  1972  . Cholecystectomy  1993  . Back surgery      x2  . Cataract extraction    . Cardiac catheterization  10/10/2008    Nonobstructive CAD  . Abdominal hysterectomy  1972    TAH  . Foot surgery    . Tonsillectomy    . Pubovag repair with sling    . Skin tag removal  2012    leg  . Cardioversion  04/13/2011    Procedure: CARDIOVERSION;  Surgeon: Laverda Page, MD;  Location: [place];  Service: Cardiovascular;   Laterality: N/A;  . Eye surgery      /w IOL, post cataracts removed   . Mass biopsy Left 12/07/2013    Procedure: EXCISIONAL BIOPSY LEFT NECK MASS ;  Surgeon: Jerrell Belfast, MD;  Location: [place];  Service: ENT;  Laterality: Left;   Social History:  reports that she has never smoked. She has never used smokeless tobacco. She reports that she does not drink alcohol or use illicit drugs.  Allergies  Allergen Reactions  . Morphine And Related Anaphylaxis and Other (See Comments)    Pt states she stopped breathing post op- "went into resp. arrest"  . Multaq [Dronedarone] Other (See Comments)    Reaction:  Blood in urine and elevated liver enzymes.   . Demerol Nausea And Vomiting  . Oysters [Shellfish Allergy] Rash  . Penicillins Itching and Rash    Has patient had a PCN reaction causing immediate rash, facial/tongue/throat swelling, SOB or lightheadedness with hypotension: No Has patient had a PCN reaction causing severe rash involving mucus membranes or skin necrosis: Yes Has patient had a PCN reaction that required hospitalization No Has patient had a  PCN reaction occurring within the last 10 years: No If all of the above answers are "NO", then may proceed with Cephalosporin use.   [facility] Cross Reactors Rash    Family History:  Family History  Problem Relation Age of Onset  . Stroke Father     at 64  . Heart attack Father     at 28  . Hypertension Father   . Heart disease Mother   . Arthritis Mother   . Breast cancer Mother     Age 70  . Heart failure Mother   . Heart attack Brother   . Hypertension Brother   . Diabetes Sister      Prior to Admission medications   Medication Sig Start Date End Date Taking? Authorizing Provider  acetaminophen (TYLENOL) 500 MG tablet Take 1,000 mg by mouth every 6 (six) hours as needed for moderate pain, fever or headache.   Yes Historical Provider, MD  albuterol (PROAIR HFA) 108 (90 BASE) MCG/ACT inhaler Inhale 2 puffs into the  lungs every 6 (six) hours as needed for wheezing or shortness of breath.   Yes Historical Provider, MD  albuterol (PROVENTIL) (2.5 MG/3ML) 0.083% nebulizer solution Inhale 3 mLs into the lungs every 6 (six) hours as needed for wheezing or shortness of breath.  02/27/15  Yes Historical Provider, MD  Cholecalciferol (VITAMIN D) 2000 UNITS tablet Take 2,000 Units by mouth daily with lunch.   Yes Historical Provider, MD  conjugated estrogens (PREMARIN) vaginal cream Place 1 Applicatorful vaginally 3 (three) times a week. Monday, Wednesday, and Friday nights 01/13/15  Yes Terrance Mass, MD  diltiazem (CARDIZEM CD) 120 MG 24 hr capsule Take 120 mg by mouth daily.  06/07/11  Yes Peter M Martinique, MD  diphenoxylate-atropine (LOMOTIL) 2.5-0.025 MG per tablet Take 1 tablet by mouth 4 (four) times daily as needed for diarrhea or loose stools. 08/08/14  Yes Heath Lark, MD  flecainide (TAMBOCOR) 100 MG tablet Take 50 mg by mouth 2 (two) times daily.   Yes Historical Provider, MD  Fluticasone-Salmeterol (ADVAIR) 250-50 MCG/DOSE AEPB Inhale 2 puffs into the lungs every 12 (twelve) hours.    Yes Historical Provider, MD  levothyroxine (SYNTHROID, LEVOTHROID) 75 MCG tablet Take 225 mcg by mouth daily before breakfast.   Yes Historical Provider, MD  lidocaine-prilocaine (EMLA) cream Apply 1 application topically as needed. Apply to [facility] a Cath site at least one hour prior to needle stick as needed. 01/04/14  Yes Heath Lark, MD  Magnesium Oxide 500 MG TABS Take 500 mg by mouth daily.    Yes Historical Provider, MD  metFORMIN (GLUCOPHAGE-XR) 500 MG 24 hr tablet Take 500 mg by mouth 2 (two) times daily with a meal.   Yes Historical Provider, MD  montelukast (SINGULAIR) 10 MG tablet Take 10 mg by mouth daily.   Yes Historical Provider, MD  ondansetron (ZOFRAN) 8 MG tablet Take 8 mg by mouth every 8 (eight) hours as needed for nausea or vomiting.   Yes Historical Provider, MD  potassium chloride SA (K-DUR,KLOR-CON) 20 MEQ tablet  Take 1 tablet (20 mEq total) by mouth daily. 12/22/11  Yes Peter M Martinique, MD  pravastatin (PRAVACHOL) 40 MG tablet Take 20 mg by mouth See admin instructions. Take 1/2 tablet (20 mg) ONLY on Monday, Tuesday, Wednesday, and Thursday Nights.   Yes Historical Provider, MD  prochlorperazine (COMPAZINE) 10 MG tablet Take 1 tablet (10 mg total) by mouth every 6 (six) hours as needed for nausea (nausea). 11/13/14  Yes Heath Lark, MD  traMADol-acetaminophen (ULTRACET) 37.5-325 MG per tablet Take 1 tablet by mouth every 6 (six) hours as needed. Patient taking differently: Take 1 tablet by mouth every 6 (six) hours as needed for moderate pain.  02/01/14  Yes Venetia Maxon Rama, MD  triamterene-hydrochlorothiazide (MAXZIDE-25) 37.5-25 MG tablet Take 1 tablet by mouth daily as needed (for edema).    Yes Historical Provider, MD  vitamin B-12 (CYANOCOBALAMIN) 500 MCG tablet Take 500 mcg by mouth daily.   Yes Historical Provider, MD  warfarin (COUMADIN) 3 MG tablet Take 3 mg by mouth daily.   Yes Historical Provider, MD  predniSONE (DELTASONE) 50 MG tablet Take 1 tablet (50 mg total) by mouth daily with breakfast. Patient not taking: Reported on 03/23/2015 02/13/15   Heath Lark, MD   Physical Exam: Filed Vitals:   03/23/15 0812 03/23/15 1040  BP: 106/50 113/58  Pulse: 105 108  Temp: 98.6 F (37 C)   TempSrc: Oral   Resp: 18 17  SpO2: 96% 94%    Physical Exam  Constitutional: Appears well-developed and well-nourished. No distress.  HENT: Normocephalic. No tonsillar erythema or exudates Eyes: Conjunctivae are normal. No scleral icterus.  Neck: Normal ROM. Neck supple. No JVD. No tracheal deviation. No thyromegaly.  CVS: tachycardic, S1/S2 appreciated  Pulmonary: Effort and breath sounds normal, no stridor, rhonchi, wheezes, rales.  Abdominal: Soft. BS +,  no distension, tenderness, rebound or guarding.  Musculoskeletal: Normal range of motion. No edema and no tenderness.  Lymphadenopathy: No  lymphadenopathy noted, cervical, inguinal. Neuro: Alert. Normal reflexes, muscle tone coordination. No focal neurologic deficits. Skin: Skin is warm and dry. No rash noted.  No erythema. No pallor.  Psychiatric: Normal mood and affect. Behavior, judgment, thought content normal.   Labs on Admission:  Basic Metabolic Panel:  Recent Labs Lab 03/23/15 0839  NA 138  K 4.9  CL 105  CO2 20*  GLUCOSE 168*  BUN 17  CREATININE 0.79  CALCIUM 8.8*   Liver Function Tests:  Recent Labs Lab 03/23/15 0839  AST 37  ALT 16  ALKPHOS 72  BILITOT 1.0  PROT 6.2*  ALBUMIN 3.6   No results for input(s): LIPASE, AMYLASE in the last 168 hours. No results for input(s): AMMONIA in the last 168 hours. CBC:  Recent Labs Lab 03/23/15 0839  WBC 10.6*  NEUTROABS 9.9*  HGB 10.4*  HCT 32.5*  MCV 87.8  PLT 234   Cardiac Enzymes: No results for input(s): CKTOTAL, CKMB, CKMBINDEX, TROPONINI in the last 168 hours. BNP: Invalid input(s): POCBNP CBG: No results for input(s): GLUCAP in the last 168 hours.  If 7PM-7AM, please contact night-coverage [email or web address] Password [facility] 03/23/2015, 12:11 PM

## 2015-03-23 NOTE — ED Notes (Signed)
Pt c/o nausea, vomiting and diarrhea that started last night per EMS. Pt given 500cc bolus of NS enroute. Pt arrived alert, oriented, and in no acute distress.

## 2015-03-23 NOTE — ED Notes (Signed)
Pt provided warm blankets per request.

## 2015-03-24 LAB — CBC
HCT: 28.3 % — ABNORMAL LOW (ref 36.0–46.0)
Hemoglobin: 8.9 g/dL — ABNORMAL LOW (ref 12.0–15.0)
MCH: 27.6 pg (ref 26.0–34.0)
MCHC: 31.4 g/dL (ref 30.0–36.0)
MCV: 87.9 fL (ref 78.0–100.0)
PLATELETS: 166 10*3/uL (ref 150–400)
RBC: 3.22 MIL/uL — AB (ref 3.87–5.11)
RDW: 16.3 % — AB (ref 11.5–15.5)
WBC: 6.8 10*3/uL (ref 4.0–10.5)

## 2015-03-24 LAB — COMPREHENSIVE METABOLIC PANEL
ALT: 15 U/L (ref 14–54)
AST: 19 U/L (ref 15–41)
Albumin: 3 g/dL — ABNORMAL LOW (ref 3.5–5.0)
Alkaline Phosphatase: 56 U/L (ref 38–126)
Anion gap: 10 (ref 5–15)
BILIRUBIN TOTAL: 1 mg/dL (ref 0.3–1.2)
BUN: 14 mg/dL (ref 6–20)
CALCIUM: 8.3 mg/dL — AB (ref 8.9–10.3)
CO2: 23 mmol/L (ref 22–32)
CREATININE: 0.57 mg/dL (ref 0.44–1.00)
Chloride: 103 mmol/L (ref 101–111)
Glucose, Bld: 126 mg/dL — ABNORMAL HIGH (ref 65–99)
Potassium: 3.4 mmol/L — ABNORMAL LOW (ref 3.5–5.1)
Sodium: 136 mmol/L (ref 135–145)
TOTAL PROTEIN: 5.1 g/dL — AB (ref 6.5–8.1)

## 2015-03-24 LAB — HEMOGLOBIN A1C
Hgb A1c MFr Bld: 6 % — ABNORMAL HIGH (ref 4.8–5.6)
MEAN PLASMA GLUCOSE: 126 mg/dL

## 2015-03-24 LAB — PROTIME-INR
INR: 1.3 (ref 0.00–1.49)
PROTHROMBIN TIME: 16.3 s — AB (ref 11.6–15.2)

## 2015-03-24 LAB — GLUCOSE, CAPILLARY: GLUCOSE-CAPILLARY: 111 mg/dL — AB (ref 65–99)

## 2015-03-24 MED ORDER — WARFARIN SODIUM 5 MG PO TABS
5.0000 mg | ORAL_TABLET | Freq: Once | ORAL | Status: AC
  Administered 2015-03-24: 5 mg via ORAL
  Filled 2015-03-24: qty 1

## 2015-03-24 MED ORDER — POTASSIUM CHLORIDE CRYS ER 20 MEQ PO TBCR
40.0000 meq | EXTENDED_RELEASE_TABLET | Freq: Once | ORAL | Status: AC
  Administered 2015-03-24: 40 meq via ORAL
  Filled 2015-03-24: qty 2

## 2015-03-24 NOTE — Progress Notes (Signed)
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    Dyslipidemia associated with type 2 diabetes mellitus ([place]) - Continue statin  Code Status: Full Family Communication: No family at bedside  Disposition Plan: Pending improvement in condition   Consultants:  None  Procedures:  None  Antibiotics:  Cipro  HPI/Subjective: Patient has no new complaints. No acute issues overnight  Objective: Filed Vitals:   03/24/15 1053 03/24/15 1400  BP:  93/58  Pulse: 86 108  Temp:  98 F (36.7 C)  Resp: 18 18    Intake/Output Summary (Last 24 hours) at 03/24/15 1715 Last data filed at 03/24/15 1400  Gross per 24 hour  Intake   1780 ml  Output   1200 ml  Net    580 ml   Filed Weights   03/23/15 1352  Weight: 65.363 kg (144 lb 1.6 oz)    Exam:   General:  Patient in no acute distress, alert and awake  Cardiovascular: S1 and S2 within normal limits, no rubs  Respiratory: No increased work of breathing  Abdomen: Soft,  nondistended, positive suprapubic discomfort on palpation  Musculoskeletal: No cyanosis or clubbing  Data Reviewed: Basic Metabolic Panel:  Recent Labs Lab 03/23/15 0839 03/23/15 1424 03/24/15 0500  NA 138 137 136  K 4.9 3.5 3.4*  CL 105 105 103  CO2 20* 21* 23  GLUCOSE 168* 151* 126*  BUN 17 16 14   CREATININE 0.79 0.70 0.57  CALCIUM 8.8* 8.6* 8.3*  MG  --  1.5*  --   PHOS  --  2.6  --    Liver Function Tests:  Recent Labs Lab 03/23/15 0839 03/23/15 1424 03/24/15 0500  AST 37 26 19  ALT 16 19 15   ALKPHOS 72 69 56  BILITOT 1.0 0.7 1.0  PROT 6.2* 6.3* 5.1*  ALBUMIN 3.6 3.6 3.0*   No results for input(s): LIPASE, AMYLASE in the last 168 hours. No results for input(s): AMMONIA in the last 168 hours. CBC:  Recent Labs Lab 03/23/15 0839 03/23/15 1424 03/24/15 0500  WBC 10.6* 8.5 6.8  NEUTROABS 9.9* 8.0*  --   HGB 10.4* 10.4* 8.9*  HCT 32.5* 33.5* 28.3*  MCV 87.8 90.3 87.9  PLT 234 232 166   Cardiac Enzymes: No results for input(s): CKTOTAL, CKMB, CKMBINDEX, TROPONINI in the last 168 hours. BNP (last 3 results) No results for input(s): BNP in the last 8760 hours.  ProBNP (last 3 results) No results for input(s): PROBNP in the last 8760 hours.  CBG:  Recent Labs Lab 03/23/15  2116 03/24/15 0725  GLUCAP 137* 111*    Recent Results (from the past 240 hour(s))  Urine culture     Status: None (Preliminary result)   Collection Time: 03/23/15  9:50 AM  Result Value Ref Range Status   Specimen Description URINE, CATHETERIZED  Final   Special Requests NONE  Final   Culture   Final    >=100,000 COLONIES/mL ENTEROCOCCUS SPECIES Performed at [facility]    Report Status PENDING  Incomplete     Studies: No results found.  Scheduled Meds: . cholecalciferol  2,000 Units Oral Q lunch  . ciprofloxacin  400 mg Intravenous Q12H  . diltiazem  120 mg Oral Daily  . flecainide  50 mg Oral BID  . levothyroxine  225 mcg Oral QAC breakfast  .  magnesium oxide  400 mg Oral Daily  . metFORMIN  500 mg Oral BID WC  . mometasone-formoterol  2 puff Inhalation BID  . montelukast  10 mg Oral Daily  . pravastatin  20 mg Oral Once per day on Mon Tue Wed Thu  . vitamin B-12  500 mcg Oral Daily  . warfarin  5 mg Oral ONCE-1800  . Warfarin - Pharmacist Dosing Inpatient   Does not apply q1800   Continuous Infusions: . sodium chloride 75 mL/hr at 03/24/15 0048   Time spent: > 35 minutes   Patty [name]  [facility] Pager [phone]. If 7PM-7AM, please contact night-coverage at [email or web address], password [facility] 03/24/2015, 5:15 PM  LOS: 1 day

## 2015-03-24 NOTE — Progress Notes (Signed)
ANTICOAGULATION CONSULT NOTE - Follow Up Consult  Pharmacy Consult for warfarin  Indication: atrial fibrillation  Allergies  Allergen Reactions  . Morphine And Related Anaphylaxis and Other (See Comments)    Pt states she stopped breathing post op- "went into resp. arrest"  . Multaq [Dronedarone] Other (See Comments)    Reaction:  Blood in urine and elevated liver enzymes.   . Demerol Nausea And Vomiting  . Oysters [Shellfish Allergy] Rash  . Penicillins Itching and Rash    Has patient had a PCN reaction causing immediate rash, facial/tongue/throat swelling, SOB or lightheadedness with hypotension: No Has patient had a PCN reaction causing severe rash involving mucus membranes or skin necrosis: Yes Has patient had a PCN reaction that required hospitalization No Has patient had a PCN reaction occurring within the last 10 years: No If all of the above answers are "NO", then may proceed with Cephalosporin use.   [name] Cross Reactors Rash    Patient Measurements: Height: 5\' 6"  (167.6 cm) Weight: 144 lb 1.6 oz (65.363 kg) IBW/kg (Calculated) : 59.3  Vital Signs: Temp: 99 F (37.2 C) (01/23 0452) Temp Source: Oral (01/23 0452) BP: 95/51 mmHg (01/23 0452) Pulse Rate: 86 (01/23 1053)  Labs:  Recent Labs  03/23/15 0839 03/23/15 1424 03/24/15 0500  HGB 10.4* 10.4* 8.9*  HCT 32.5* 33.5* 28.3*  PLT 234 232 166  LABPROT 18.0*  --  16.3*  INR 1.48  --  1.30  CREATININE 0.79 0.70 0.57    Estimated Creatinine Clearance: 56 mL/min (by C-G formula based on Cr of 0.57).   Medical History: History reviewed. No pertinent past medical history.  Medications:  Scheduled:  . cholecalciferol  2,000 Units Oral Q lunch  . ciprofloxacin  400 mg Intravenous Q12H  . diltiazem  120 mg Oral Daily  . flecainide  50 mg Oral BID  . levothyroxine  225 mcg Oral QAC breakfast  . magnesium oxide  400 mg Oral Daily  . metFORMIN  500 mg Oral BID WC  . mometasone-formoterol  2 puff  Inhalation BID  . montelukast  10 mg Oral Daily  . pravastatin  20 mg Oral Once per day on Mon Tue Wed Thu  . vitamin B-12  500 mcg Oral Daily  . Warfarin - Pharmacist Dosing Inpatient   Does not apply q1800   Infusions:  . sodium chloride 75 mL/hr at 03/24/15 0048    Assessment: 75 yo female presented to ER with CC NVD. To continue warfarin for Afib as inpatient per pharmacy dosing. Home regimen prior to admission was reportedly 3mg  daily with last dose at home being 1/21. INR subtherapeutic on admission.  Today, 03/24/2015: - INR 1.30 - Hgb 8.9, plts 166. Decreased from admission after IVF provided so possibly dilutional.  No bleeding reported. - Regular diet, eating. - Drug interaction: Cipro started 1/22 and can cause increase in INR d/t drug interaction with warfarin  Goal of Therapy:  INR 2-3   Plan:  1) Repeat Warfarin 5mg  tonight 2) Daily INR  Hershal Coria, PharmD, BCPS Pager: [phone] 03/24/2015 11:40 AM

## 2015-03-25 LAB — URINE CULTURE: Culture: >=100000

## 2015-03-25 LAB — PROTIME-INR
INR: 1.21 (ref 0.00–1.49)
PROTHROMBIN TIME: 15.4 s — AB (ref 11.6–15.2)

## 2015-03-25 LAB — GLUCOSE, CAPILLARY: GLUCOSE-CAPILLARY: 120 mg/dL — AB (ref 65–99)

## 2015-03-25 MED ORDER — LEVOFLOXACIN 500 MG PO TABS
500.0000 mg | ORAL_TABLET | Freq: Every day | ORAL | Status: DC
  Filled 2015-03-25: qty 1

## 2015-03-25 MED ORDER — WARFARIN SODIUM 6 MG PO TABS
6.0000 mg | ORAL_TABLET | Freq: Once | ORAL | Status: AC
  Administered 2015-03-25: 6 mg via ORAL
  Filled 2015-03-25: qty 1

## 2015-03-25 MED ORDER — FOSFOMYCIN TROMETHAMINE 3 G PO PACK
3.0000 g | PACK | Freq: Once | ORAL | Status: AC
  Administered 2015-03-25: 3 g via ORAL
  Filled 2015-03-25: qty 3

## 2015-03-25 NOTE — Progress Notes (Signed)
[facility] PROGRESS NOTE  CRAIG WAHLER [id number] DOB: 1941-01-28 DOA: 03/23/2015 PCP: Horatio Pel, MD   Brief Narrative: 75 y/o with history of chronic atrial fibrillation, hypothyroidism, history of malignant lymphomas who presented with nausea vomiting and diarrhea.   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 Patient will be treated fosfomycin x 1    Acquired hypogammaglobulinemia (HCC)    Controlled type 2 diabetes mellitus without complication, without long-term current use of insulin ([place]) - continue current regimen - may have to d/c metformin will wait to see how patient does.    Dyslipidemia associated with type 2 diabetes mellitus ([place]) - Continue statin  Code Status: Full Family Communication: No family at bedside  Disposition Plan: Most likely d/c next am if continued improvement   Consultants:  None  Procedures:  None  Antibiotics:  Fosfomycin  HPI/Subjective: Patient still has no new complaints. No acute issues overnight  Objective: Filed Vitals:   03/25/15 0442 03/25/15 1423  BP: 105/63 112/64  Pulse: 96 113  Temp: 98.2 F (36.8 C) 99.1 F (37.3 C)  Resp: 16 16    Intake/Output Summary (Last 24 hours) at 03/25/15 1651 Last data filed at 03/25/15 1424  Gross per 24 hour  Intake    240 ml  Output   1900 ml  Net  -1660 ml    Filed Weights   03/23/15 1352  Weight: 65.363 kg (144 lb 1.6 oz)    Exam:   General:  Patient in no acute distress, alert and awake  Cardiovascular: S1 and S2 within normal limits, no rubs  Respiratory: No increased work of breathing  Abdomen: Soft, nondistended, positive suprapubic discomfort on palpation  Musculoskeletal: No cyanosis or clubbing  Data Reviewed: Basic Metabolic Panel:  Recent Labs Lab 03/23/15 0839 03/23/15 1424 03/24/15 0500  NA 138 137 136  K 4.9 3.5 3.4*  CL 105 105 103  CO2 20* 21* 23  GLUCOSE 168* 151* 126*  BUN 17 16 14   CREATININE 0.79 0.70 0.57  CALCIUM 8.8* 8.6* 8.3*  MG  --  1.5*  --   PHOS  --  2.6  --    Liver Function Tests:  Recent Labs Lab 03/23/15 0839 03/23/15 1424 03/24/15 0500  AST 37 26 19  ALT 16 19 15   ALKPHOS 72 69 56  BILITOT 1.0 0.7 1.0  PROT 6.2* 6.3* 5.1*  ALBUMIN 3.6 3.6 3.0*   No results for input(s): LIPASE, AMYLASE in the last 168 hours. No results for input(s): AMMONIA in the last 168 hours. CBC:  Recent Labs Lab 03/23/15 0839 03/23/15 1424 03/24/15 0500  WBC 10.6* 8.5 6.8  NEUTROABS 9.9* 8.0*  --   HGB 10.4* 10.4* 8.9*  HCT 32.5* 33.5* 28.3*  MCV 87.8 90.3 87.9  PLT 234 232 166   Cardiac Enzymes: No results for input(s): CKTOTAL, CKMB, CKMBINDEX, TROPONINI in the  last 168 hours. BNP (last 3 results) No results for input(s): BNP in the last 8760 hours.  ProBNP (last 3 results) No results for input(s): PROBNP in the last 8760 hours.  CBG:  Recent Labs Lab 03/23/15 2116 03/24/15 0725 03/25/15 0732  GLUCAP 137* 111* 120*    Recent Results (from the past 240 hour(s))  Urine culture     Status: None   Collection Time: 03/23/15  9:50 AM  Result Value Ref Range Status   Specimen Description URINE, CATHETERIZED  Final   Special Requests NONE  Final   Culture   Final    >=100,000 COLONIES/mL ENTEROCOCCUS SPECIES Performed at [facility]    Report Status 03/25/2015 FINAL   Final   Organism ID, Bacteria ENTEROCOCCUS SPECIES  Final      Susceptibility   Enterococcus species - MIC*    AMPICILLIN <=2 SENSITIVE Sensitive     LEVOFLOXACIN 1 SENSITIVE Sensitive     NITROFURANTOIN <=16 SENSITIVE Sensitive     VANCOMYCIN 1 SENSITIVE Sensitive     * >=100,000 COLONIES/mL ENTEROCOCCUS SPECIES     Studies: No results found.  Scheduled Meds: . cholecalciferol  2,000 Units Oral Q lunch  . diltiazem  120 mg Oral Daily  . flecainide  50 mg Oral BID  . fosfomycin  3 g Oral Once  . levothyroxine  225 mcg Oral QAC breakfast  . magnesium oxide  400 mg Oral Daily  . metFORMIN  500 mg Oral BID WC  . mometasone-formoterol  2 puff Inhalation BID  . montelukast  10 mg Oral Daily  . pravastatin  20 mg Oral Once per day on Mon Tue Wed Thu  . vitamin B-12  500 mcg Oral Daily  . warfarin  6 mg Oral ONCE-1800  . Warfarin - Pharmacist Dosing Inpatient   Does not apply q1800   Continuous Infusions: . sodium chloride 75 mL/hr at 03/25/15 1444   Time spent: > 35 minutes   Velvet Bathe  [facility] Pager [phone]. If 7PM-7AM, please contact night-coverage at [email or web address], password [facility] 03/25/2015, 4:51 PM  LOS: 2 days

## 2015-03-25 NOTE — Progress Notes (Signed)
ANTICOAGULATION CONSULT NOTE - Follow Up Consult  Pharmacy Consult for warfarin  Indication: atrial fibrillation  Allergies  Allergen Reactions  . Morphine And Related Anaphylaxis and Other (See Comments)    Pt states she stopped breathing post op- "went into resp. arrest"  . Multaq [Dronedarone] Other (See Comments)    Reaction:  Blood in urine and elevated liver enzymes.   . Demerol Nausea And Vomiting  . Oysters [Shellfish Allergy] Rash  . Penicillins Itching and Rash    Has patient had a PCN reaction causing immediate rash, facial/tongue/throat swelling, SOB or lightheadedness with hypotension: No Has patient had a PCN reaction causing severe rash involving mucus membranes or skin necrosis: Yes Has patient had a PCN reaction that required hospitalization No Has patient had a PCN reaction occurring within the last 10 years: No If all of the above answers are "NO", then may proceed with Cephalosporin use.   [name] Cross Reactors Rash    Patient Measurements: Height: 5\' 6"  (167.6 cm) Weight: 144 lb 1.6 oz (65.363 kg) IBW/kg (Calculated) : 59.3  Vital Signs: Temp: 98.2 F (36.8 C) (01/24 0442) Temp Source: Oral (01/24 0442) BP: 105/63 mmHg (01/24 0442) Pulse Rate: 96 (01/24 0442)  Labs:  Recent Labs  03/23/15 0839 03/23/15 1424 03/24/15 0500 03/25/15 0545  HGB 10.4* 10.4* 8.9*  --   HCT 32.5* 33.5* 28.3*  --   PLT 234 232 166  --   LABPROT 18.0*  --  16.3* 15.4*  INR 1.48  --  1.30 1.21  CREATININE 0.79 0.70 0.57  --     Estimated Creatinine Clearance: 56 mL/min (by C-G formula based on Cr of 0.57).   Medical History: History reviewed. No pertinent past medical history.  Medications:  Scheduled:  . cholecalciferol  2,000 Units Oral Q lunch  . ciprofloxacin  400 mg Intravenous Q12H  . diltiazem  120 mg Oral Daily  . flecainide  50 mg Oral BID  . levothyroxine  225 mcg Oral QAC breakfast  . magnesium oxide  400 mg Oral Daily  . metFORMIN  500 mg  Oral BID WC  . mometasone-formoterol  2 puff Inhalation BID  . montelukast  10 mg Oral Daily  . pravastatin  20 mg Oral Once per day on Mon Tue Wed Thu  . vitamin B-12  500 mcg Oral Daily  . Warfarin - Pharmacist Dosing Inpatient   Does not apply q1800   Infusions:  . sodium chloride 75 mL/hr at 03/24/15 0048    Assessment: 75 yo female presented to ER with CC NVD. To continue warfarin for Afib as inpatient per pharmacy dosing. Home regimen prior to admission was reportedly 3mg  daily with last dose at home being 1/21. INR subtherapeutic on admission.  Today, 03/25/2015: - INR 1.21, trending down despite higher warfarin doses - Hgb 8.9, plts 166 (1/23). Decreased from admission after IVF provided so possibly dilutional.  No bleeding reported. - Regular diet, eating. - Drug interaction: Cipro started 1/22 and can cause increase in INR d/t drug interaction with warfarin  Goal of Therapy:  INR 2-3   Plan:  1) Warfarin 6mg  tonight 2) Daily INR  Hershal Coria, PharmD, BCPS Pager: [phone] 03/25/2015 9:49 AM

## 2015-03-26 ENCOUNTER — Telehealth: Payer: Self-pay | Admitting: *Deleted

## 2015-03-26 LAB — PROTIME-INR
INR: 1.61 — AB (ref 0.00–1.49)
PROTHROMBIN TIME: 18.6 s — AB (ref 11.6–15.2)

## 2015-03-26 LAB — GLUCOSE, CAPILLARY: Glucose-Capillary: 103 mg/dL — ABNORMAL HIGH (ref 65–99)

## 2015-03-26 MED ORDER — WARFARIN SODIUM 4 MG PO TABS
4.0000 mg | ORAL_TABLET | Freq: Once | ORAL | Status: DC
  Filled 2015-03-26: qty 1

## 2015-03-26 MED ORDER — WARFARIN SODIUM 4 MG PO TABS: 4.0000 mg | ORAL_TABLET | Freq: Every day | ORAL | Status: DC

## 2015-03-26 NOTE — Telephone Encounter (Signed)
Per staff message from MD I have moved apaps from tomorrow to next week. Patient to receive appt at discharge.

## 2015-03-26 NOTE — Discharge Summary (Signed)
Physician Discharge Summary  Patty [name] [id number] DOB: 1940-08-03 DOA: 03/23/2015  PCP: Horatio Pel, MD  Admit date: 03/23/2015 Discharge date: 03/26/2015  Time spent: > 35 minutes  Recommendations for Outpatient Follow-up:  1. Please monitor potassium levels 2. Monitor hemoglobin levels 3. Monitor INR levels and her Coumadin was recently increased to 4 mg by mouth daily. 4. Monitor blood sugar levels 5. 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, face, and neck (HCC)   Anemia in chronic illness   Leukocytosis   Long term current use of anticoagulant therapy   UTI (urinary tract infection)   Acquired hypogammaglobulinemia (HCC)   Controlled type 2 diabetes mellitus without complication, without long-term current use of insulin (HCC)   Dyslipidemia associated with type 2 diabetes mellitus ([place])   Discharge Condition: stable  Diet recommendation: regular diet  Filed Weights   03/23/15 1352  Weight: 65.363 kg (144 lb 1.6 oz)    History of present illness:  From original history of present illness: 75 year old female with past medical history of malignant lymphoma, acquired hypogammaglobulinemia (under Dr. Calton Dach care), hypertension, dyslipidemia, atrial fibrillation on anticoagulation with coumadin who presented to [facility] ED with reports of nausea, vomiting, diarrhea since last night. She has some abdominal cramping but no fevers. No blood in stool. No reports of chest pain, palpitations, shortness of breath  Hospital Course:  Nausea vomiting and diarrhea -Resolved, patient to continue supportive therapy at home with antiemetics as needed  UTI - Enterococcus species identified which patient was treated with  fosfomycin times one time dose  Atrial fibrillation - Continue diltiazem for rate control -Continue Coumadin and increase dose on discharge - Recommended patient follow-up with primary care physician to reassess PT and INR levels in the next 2 or 3 days  Otherwise for known comorbidities listed above we'll continue home medication regimen. Of note for patient's hypertension will hold Maxzide  Procedures:  None  Consultations:  None  Discharge Exam: Filed Vitals:   03/25/15 2146 03/26/15 0552  BP: 117/63 118/76  Pulse: 118 112  Temp: 98.2 F (36.8 C) 97.9 F (36.6 C)  Resp: 18 16    General: Pt in nad, alert and awake Cardiovascular: s1 and s2 wnl, no rubs Respiratory: cta bl, no wheezes  Discharge Instructions   Discharge Instructions    Call MD for:  difficulty breathing, headache or visual disturbances    Complete by:  As directed      Call MD for:  severe uncontrolled pain    Complete by:  As directed      Call MD for:  temperature >100.4    Complete by:  As directed      Diet - low sodium heart healthy    Complete by:  As directed      Discharge instructions    Complete by:  As directed   Please follow up with your primary care physician in the next 2-3 days to have your INR levels rechecked. Your coumadin was recently increased to 4 mg po daily.     Increase activity slowly    Complete by:  As directed           Current Discharge Medication List    CONTINUE these medications which have CHANGED  Details  warfarin (COUMADIN) 4 MG tablet Take 1 tablet (4 mg total) by mouth daily. Qty: 15 tablet, Refills: 0      CONTINUE these medications which have NOT CHANGED   Details  acetaminophen (TYLENOL) 500 MG tablet Take 1,000 mg by mouth every 6 (six) hours as needed for moderate pain, fever or headache.    albuterol (PROAIR HFA) 108 (90 BASE) MCG/ACT inhaler Inhale 2 puffs into the lungs every 6 (six) hours as needed for wheezing or shortness of breath.     albuterol (PROVENTIL) (2.5 MG/3ML) 0.083% nebulizer solution Inhale 3 mLs into the lungs every 6 (six) hours as needed for wheezing or shortness of breath.     Cholecalciferol (VITAMIN D) 2000 UNITS tablet Take 2,000 Units by mouth daily with lunch.    conjugated estrogens (PREMARIN) vaginal cream Place 1 Applicatorful vaginally 3 (three) times a week. Monday, Wednesday, and Friday nights Qty: 42.5 g, Refills: 4    diltiazem (CARDIZEM CD) 120 MG 24 hr capsule Take 120 mg by mouth daily.     diphenoxylate-atropine (LOMOTIL) 2.5-0.025 MG per tablet Take 1 tablet by mouth 4 (four) times daily as needed for diarrhea or loose stools. Qty: 90 tablet, Refills: 0   Associated Diagnoses: Diarrhea    flecainide (TAMBOCOR) 100 MG tablet Take 50 mg by mouth 2 (two) times daily.    Fluticasone-Salmeterol (ADVAIR) 250-50 MCG/DOSE AEPB Inhale 2 puffs into the lungs every 12 (twelve) hours.     levothyroxine (SYNTHROID, LEVOTHROID) 75 MCG tablet Take 225 mcg by mouth daily before breakfast.    lidocaine-prilocaine (EMLA) cream Apply 1 application topically as needed. Apply to [facility] a Cath site at least one hour prior to needle stick as needed. Qty: 30 g, Refills: 3    Magnesium Oxide 500 MG TABS Take 500 mg by mouth daily.     metFORMIN (GLUCOPHAGE-XR) 500 MG 24 hr tablet Take 500 mg by mouth 2 (two) times daily with a meal.    montelukast (SINGULAIR) 10 MG tablet Take 10 mg by mouth daily.    ondansetron (ZOFRAN) 8 MG tablet Take 8 mg by mouth every 8 (eight) hours as needed for nausea or vomiting.    potassium chloride SA (K-DUR,KLOR-CON) 20 MEQ tablet Take 1 tablet (20 mEq total) by mouth daily. Qty: 30 tablet, Refills: 5    pravastatin (PRAVACHOL) 40 MG tablet Take 20 mg by mouth See admin instructions. Take 1/2 tablet (20 mg) ONLY on Monday, Tuesday, Wednesday, and Thursday Nights.    prochlorperazine (COMPAZINE) 10 MG tablet Take 1 tablet (10 mg total) by mouth every 6 (six) hours as needed  for nausea (nausea). Qty: 30 tablet, Refills: 6    traMADol-acetaminophen (ULTRACET) 37.5-325 MG per tablet Take 1 tablet by mouth every 6 (six) hours as needed. Qty: 30 tablet, Refills: 0    vitamin B-12 (CYANOCOBALAMIN) 500 MCG tablet Take 500 mcg by mouth daily.      STOP taking these medications     triamterene-hydrochlorothiazide (MAXZIDE-25) 37.5-25 MG tablet      predniSONE (DELTASONE) 50 MG tablet        Allergies  Allergen Reactions  . Morphine And Related Anaphylaxis and Other (See Comments)    Pt states she stopped breathing post op- "went into resp. arrest"  . Multaq [Dronedarone] Other (See Comments)    Reaction:  Blood in urine and elevated liver enzymes.   . Demerol Nausea And Vomiting  . Oysters [Shellfish Allergy] Rash  . Penicillins Itching and Rash  Has patient had a PCN reaction causing immediate rash, facial/tongue/throat swelling, SOB or lightheadedness with hypotension: No Has patient had a PCN reaction causing severe rash involving mucus membranes or skin necrosis: Yes Has patient had a PCN reaction that required hospitalization No Has patient had a PCN reaction occurring within the last 10 years: No If all of the above answers are "NO", then may proceed with Cephalosporin use.   . Sulfa Drugs Cross Reactors Rash      The results of significant diagnostics from this hospitalization (including imaging, microbiology, ancillary and laboratory) are listed below for reference.    Significant Diagnostic Studies: Dg Chest 2 View  03/02/2015  CLINICAL DATA:  One month history of cough with fever today. EXAM: CHEST  2 VIEW COMPARISON:  01/23/2015 FINDINGS: Two-view exam shows new patchy airspace disease in the right lower lung. Left lung remains clear. The cardiopericardial silhouette is within normal limits for size. Right-sided Port-A-Cath remains in place with distal tip position overlying the SVC/ RA junction. The visualized bony structures of the thorax  are intact. IMPRESSION: New patchy right lower lung airspace disease, compatible with pneumonia. Electronically Signed   By: Misty Stanley M.D.   On: 03/02/2015 18:05    Microbiology: Recent Results (from the past 240 hour(s))  Urine culture     Status: None   Collection Time: 03/23/15  9:50 AM  Result Value Ref Range Status   Specimen Description URINE, CATHETERIZED  Final   Special Requests NONE  Final   Culture   Final    >=100,000 COLONIES/mL ENTEROCOCCUS SPECIES Performed at [facility]    Report Status 03/25/2015 FINAL  Final   Organism ID, Bacteria ENTEROCOCCUS SPECIES  Final      Susceptibility   Enterococcus species - MIC*    AMPICILLIN <=2 SENSITIVE Sensitive     LEVOFLOXACIN 1 SENSITIVE Sensitive     NITROFURANTOIN <=16 SENSITIVE Sensitive     VANCOMYCIN 1 SENSITIVE Sensitive     * >=100,000 COLONIES/mL ENTEROCOCCUS SPECIES     Labs: Basic Metabolic Panel:  Recent Labs Lab 03/23/15 0839 03/23/15 1424 03/24/15 0500  NA 138 137 136  K 4.9 3.5 3.4*  CL 105 105 103  CO2 20* 21* 23  GLUCOSE 168* 151* 126*  BUN 17 16 14   CREATININE 0.79 0.70 0.57  CALCIUM 8.8* 8.6* 8.3*  MG  --  1.5*  --   PHOS  --  2.6  --    Liver Function Tests:  Recent Labs Lab 03/23/15 0839 03/23/15 1424 03/24/15 0500  AST 37 26 19  ALT 16 19 15   ALKPHOS 72 69 56  BILITOT 1.0 0.7 1.0  PROT 6.2* 6.3* 5.1*  ALBUMIN 3.6 3.6 3.0*   No results for input(s): LIPASE, AMYLASE in the last 168 hours. No results for input(s): AMMONIA in the last 168 hours. CBC:  Recent Labs Lab 03/23/15 0839 03/23/15 1424 03/24/15 0500  WBC 10.6* 8.5 6.8  NEUTROABS 9.9* 8.0*  --   HGB 10.4* 10.4* 8.9*  HCT 32.5* 33.5* 28.3*  MCV 87.8 90.3 87.9  PLT 234 232 166   Cardiac Enzymes: No results for input(s): CKTOTAL, CKMB, CKMBINDEX, TROPONINI in the last 168 hours. BNP: BNP (last 3 results) No results for input(s): BNP in the last 8760 hours.  ProBNP (last 3 results) No results for  input(s): PROBNP in the last 8760 hours.  CBG:  Recent Labs Lab 03/23/15 2116 03/24/15 0725 03/25/15 0732 03/26/15 0800  GLUCAP 137* 111* 120*  103*    Signed:  Velvet Bathe MD.  [facility] 03/26/2015, 1:23 PM

## 2015-03-26 NOTE — Progress Notes (Signed)
ANTICOAGULATION CONSULT NOTE - Follow Up Consult  Pharmacy Consult for warfarin  Indication: atrial fibrillation  Allergies  Allergen Reactions  . Morphine And Related Anaphylaxis and Other (See Comments)    Pt states she stopped breathing post op- "went into resp. arrest"  . Multaq [Dronedarone] Other (See Comments)    Reaction:  Blood in urine and elevated liver enzymes.   . Demerol Nausea And Vomiting  . Oysters [Shellfish Allergy] Rash  . Penicillins Itching and Rash    Has patient had a PCN reaction causing immediate rash, facial/tongue/throat swelling, SOB or lightheadedness with hypotension: No Has patient had a PCN reaction causing severe rash involving mucus membranes or skin necrosis: Yes Has patient had a PCN reaction that required hospitalization No Has patient had a PCN reaction occurring within the last 10 years: No If all of the above answers are "NO", then may proceed with Cephalosporin use.   [facility] Cross Reactors Rash    Patient Measurements: Height: 5\' 6"  (167.6 cm) Weight: 144 lb 1.6 oz (65.363 kg) IBW/kg (Calculated) : 59.3  Vital Signs: Temp: 97.9 F (36.6 C) (01/25 0552) Temp Source: Oral (01/25 0552) BP: 118/76 mmHg (01/25 0552) Pulse Rate: 112 (01/25 0552)  Labs:  Recent Labs  03/23/15 0839 03/23/15 1424 03/24/15 0500 03/25/15 0545 03/26/15 0415  HGB 10.4* 10.4* 8.9*  --   --   HCT 32.5* 33.5* 28.3*  --   --   PLT 234 232 166  --   --   LABPROT 18.0*  --  16.3* 15.4* 18.6*  INR 1.48  --  1.30 1.21 1.61*  CREATININE 0.79 0.70 0.57  --   --     Estimated Creatinine Clearance: 56 mL/min (by C-G formula based on Cr of 0.57).   Medical History: History reviewed. No pertinent past medical history.  Medications:  Scheduled:  . cholecalciferol  2,000 Units Oral Q lunch  . diltiazem  120 mg Oral Daily  . flecainide  50 mg Oral BID  . levothyroxine  225 mcg Oral QAC breakfast  . magnesium oxide  400 mg Oral Daily  . metFORMIN  500  mg Oral BID WC  . mometasone-formoterol  2 puff Inhalation BID  . montelukast  10 mg Oral Daily  . pravastatin  20 mg Oral Once per day on Mon Tue Wed Thu  . vitamin B-12  500 mcg Oral Daily  . Warfarin - Pharmacist Dosing Inpatient   Does not apply q1800   Infusions:  . sodium chloride 75 mL/hr at 03/25/15 1444    Assessment: 75 yo female presented to ER with CC NVD. To continue warfarin for Afib as inpatient per pharmacy dosing. Home regimen prior to admission was reportedly 3mg  daily with last dose at home taken 1/21. INR subtherapeutic on admission.  Today, 03/26/2015: - INR subtherapeutic but trending up after three inpatient booster doses 1/22-1/24 (see MAR) - Hgb 8.9, plts 166 (1/23). Decreased from admission after IVF provided so possibly dilutional.  No bleeding reportedper notes. - Regular diet, eating. - Drug interaction: Cipro 1/22-1/23, can increase INR response to warfarin; any such effect should be dissipating by now.  Goal of Therapy:  INR 2-3   Plan:  1. Warfarin 4 mg PO tonight. 2. PT/INR daily while inpatient.  Clayburn Pert, PharmD, BCPS Pager: [phone] 03/26/2015  8:12 AM

## 2015-03-26 NOTE — Care Management Important Message (Signed)
Important Message  Patient Details IM Letter given to Nora/Case Manager to present to Patient Name: Patty [name] MRN: [id number] Date of Birth: 1940/06/09   Medicare Important Message Given:  Yes    Camillo Flaming 03/26/2015, 10:29 AMImportant Message  Patient Details  Name: Patty [name] MRN: [id number] Date of Birth: Dec 29, 1940   Medicare Important Message Given:  Yes    Camillo Flaming 03/26/2015, 10:28 AM

## 2015-03-26 NOTE — Progress Notes (Signed)
Patient alert and oriented. Patient given discharge instructions and prescriptions. All questions answered.

## 2015-03-27 ENCOUNTER — Ambulatory Visit: Payer: Medicare Other

## 2015-03-31 ENCOUNTER — Ambulatory Visit
Admission: RE | Admit: 2015-03-31 | Discharge: 2015-03-31 | Disposition: A | Discharge status: Home / Self Care | Payer: Medicare Other | Source: Ambulatory Visit

## 2015-03-31 DIAGNOSIS — Z1231 Encounter for screening mammogram for malignant neoplasm of breast: Secondary | ICD-10-CM | POA: Diagnosis not present

## 2015-04-01 ENCOUNTER — Other Ambulatory Visit: Payer: Self-pay | Admitting: Gynecology

## 2015-04-01 DIAGNOSIS — R928 Other abnormal and inconclusive findings on diagnostic imaging of breast: Secondary | ICD-10-CM

## 2015-04-02 ENCOUNTER — Ambulatory Visit (HOSPITAL_BASED_OUTPATIENT_CLINIC_OR_DEPARTMENT_OTHER): Payer: Medicare Other

## 2015-04-02 ENCOUNTER — Other Ambulatory Visit (HOSPITAL_BASED_OUTPATIENT_CLINIC_OR_DEPARTMENT_OTHER): Payer: Medicare Other

## 2015-04-02 ENCOUNTER — Telehealth: Payer: Self-pay | Admitting: *Deleted

## 2015-04-02 VITALS — BP 98/51 | HR 89 | Temp 97.9°F | Resp 18

## 2015-04-02 DIAGNOSIS — D801 Nonfamilial hypogammaglobulinemia: Secondary | ICD-10-CM

## 2015-04-02 DIAGNOSIS — C8591 Non-Hodgkin lymphoma, unspecified, lymph nodes of head, face, and neck: Secondary | ICD-10-CM

## 2015-04-02 DIAGNOSIS — C50911 Malignant neoplasm of unspecified site of right female breast: Secondary | ICD-10-CM

## 2015-04-02 HISTORY — DX: Malignant neoplasm of unspecified site of right female breast: C50.911

## 2015-04-02 LAB — COMPREHENSIVE METABOLIC PANEL
ALK PHOS: 101 U/L (ref 40–150)
ALT: 35 U/L (ref 0–55)
ANION GAP: 11 meq/L (ref 3–11)
AST: 36 U/L — ABNORMAL HIGH (ref 5–34)
Albumin: 3.7 g/dL (ref 3.5–5.0)
BUN: 12.4 mg/dL (ref 7.0–26.0)
CALCIUM: 9.4 mg/dL (ref 8.4–10.4)
CHLORIDE: 105 meq/L (ref 98–109)
CO2: 25 mEq/L (ref 22–29)
CREATININE: 0.8 mg/dL (ref 0.6–1.1)
EGFR: 74 mL/min/{1.73_m2} — ABNORMAL LOW (ref >90)
Glucose: 109 mg/dl (ref 70–140)
Potassium: 5.2 mEq/L — ABNORMAL HIGH (ref 3.5–5.1)
Sodium: 140 mEq/L (ref 136–145)
Total Bilirubin: 0.38 mg/dL (ref 0.20–1.20)
Total Protein: 6.2 g/dL — ABNORMAL LOW (ref 6.4–8.3)

## 2015-04-02 LAB — TECHNOLOGIST REVIEW

## 2015-04-02 LAB — CBC WITH DIFFERENTIAL/PLATELET
BASO%: 0.4 % (ref 0.0–2.0)
BASOS ABS: 0 10*3/uL (ref 0.0–0.1)
EOS%: 1.5 % (ref 0.0–7.0)
Eosinophils Absolute: 0.1 10*3/uL (ref 0.0–0.5)
HEMATOCRIT: 34 % — AB (ref 34.8–46.6)
HGB: 10.7 g/dL — ABNORMAL LOW (ref 11.6–15.9)
LYMPH#: 1.7 10*3/uL (ref 0.9–3.3)
LYMPH%: 18 % (ref 14.0–49.7)
MCH: 27.7 pg (ref 25.1–34.0)
MCHC: 31.5 g/dL (ref 31.5–36.0)
MCV: 88.1 fL (ref 79.5–101.0)
MONO#: 1 10*3/uL — AB (ref 0.1–0.9)
MONO%: 11.1 % (ref 0.0–14.0)
NEUT#: 6.3 10*3/uL (ref 1.5–6.5)
NEUT%: 69 % (ref 38.4–76.8)
PLATELETS: 239 10*3/uL (ref 145–400)
RBC: 3.86 10*6/uL (ref 3.70–5.45)
RDW: 16.2 % — ABNORMAL HIGH (ref 11.2–14.5)
WBC: 9.2 10*3/uL (ref 3.9–10.3)

## 2015-04-02 MED ORDER — IMMUNE GLOBULIN (HUMAN) 10 GM/100ML IV SOLN
1.0000 g/kg | Freq: Once | INTRAVENOUS | Status: AC
  Administered 2015-04-02: 65 g via INTRAVENOUS
  Filled 2015-04-02: qty 650

## 2015-04-02 MED ORDER — SODIUM CHLORIDE 0.9 % IJ SOLN
10.0000 mL | INTRAMUSCULAR | Status: DC | PRN
  Administered 2015-04-02: 10 mL
  Filled 2015-04-02: qty 10

## 2015-04-02 MED ORDER — DIPHENHYDRAMINE HCL 25 MG PO TABS
25.0000 mg | ORAL_TABLET | ORAL | Status: DC | PRN
  Administered 2015-04-02: 25 mg via ORAL
  Filled 2015-04-02: qty 1

## 2015-04-02 MED ORDER — HEPARIN SOD (PORK) LOCK FLUSH 100 UNIT/ML IV SOLN
500.0000 [IU] | Freq: Once | INTRAVENOUS | Status: AC | PRN
  Administered 2015-04-02: 500 [IU]
  Filled 2015-04-02: qty 5

## 2015-04-02 MED ORDER — DIPHENHYDRAMINE HCL 25 MG PO CAPS
ORAL_CAPSULE | ORAL | Status: AC
  Filled 2015-04-02: qty 1

## 2015-04-02 MED ORDER — ACETAMINOPHEN 325 MG PO TABS
ORAL_TABLET | ORAL | Status: AC
  Filled 2015-04-02: qty 2

## 2015-04-02 MED ORDER — ACETAMINOPHEN 325 MG PO TABS
650.0000 mg | ORAL_TABLET | Freq: Four times a day (QID) | ORAL | Status: DC | PRN
  Administered 2015-04-02: 650 mg via ORAL

## 2015-04-02 MED ORDER — SODIUM CHLORIDE 0.9 % IV SOLN
Freq: Once | INTRAVENOUS | Status: AC
  Administered 2015-04-02: 11:00:00 via INTRAVENOUS

## 2015-04-02 NOTE — Patient Instructions (Signed)

## 2015-04-02 NOTE — Telephone Encounter (Signed)
S/w pt in infusion room.  Instructed her to hold Potassium and f/u w/ Dr. Shelia Media.  She says she has appt to see him next week.  Faxed lab results to Dr. Pennie Banter office.

## 2015-04-02 NOTE — Telephone Encounter (Signed)
-----   Message from Heath Lark, MD sent at 04/02/2015 11:41 AM EST ----- Regarding: potassium level Potassium level a little high Recommend hold potassium for a few days and follow with PCP ----- Message -----    From: [facility] Interface    Sent: 04/02/2015  10:31 AM      To: Heath Lark, MD

## 2015-04-03 ENCOUNTER — Telehealth: Payer: Self-pay | Admitting: *Deleted

## 2015-04-03 NOTE — Telephone Encounter (Signed)
Called notifying of Dr. Alvy Bimler orders and symptom correlation.  Ms. Nagarajan has eaten toast and hot tea.  Will try tylenol for headache.  Has zofran on hand.  "I think I'll be fine.  Aware to call for work in tomorrow if needed.

## 2015-04-03 NOTE — Telephone Encounter (Signed)
The headache could be a slight reaction from IVIG. Usual recommendation is Tylenol The frequent urination could be from IVF, sounds like it has resolved Nausea is not related. She can take zofran as needed Coughing is not related. Cause is unclear. Sounds like it has resolved She should try to eat breakfast, take tylenol and zofran as needed and rest If not better, call tomorrow morning and I will work her in tomorrow

## 2015-04-03 NOTE — Telephone Encounter (Signed)
"  I received immune globulin yesterday.  Coughing yesterday after treatment.  This morning temp = 99.6 and my normal temp = 97.8.  I feel cold and using heating pad..  I have a headache across my forehead and this is not usual for me.  Last night I had to go to the bathroom more frequently, every one to two hours.  Awake since 6:00 am and not having to urinate so maybe it was the IVF yesterday.  No burning or pain with urinating.  Slightly nauseated as the morning progresses.  Have not eaten anything yet.  Is tyis from the immune globulin or something else.  What do I need to do?"

## 2015-04-04 ENCOUNTER — Other Ambulatory Visit: Payer: Self-pay | Admitting: Gynecology

## 2015-04-04 ENCOUNTER — Ambulatory Visit
Admission: RE | Admit: 2015-04-04 | Discharge: 2015-04-04 | Disposition: A | Discharge status: Home / Self Care | Payer: Medicare Other | Source: Ambulatory Visit | Attending: Gynecology | Admitting: Gynecology

## 2015-04-04 DIAGNOSIS — N63 Unspecified lump in breast: Secondary | ICD-10-CM | POA: Diagnosis not present

## 2015-04-04 DIAGNOSIS — N6310 Unspecified lump in the right breast, unspecified quadrant: Secondary | ICD-10-CM

## 2015-04-04 DIAGNOSIS — R928 Other abnormal and inconclusive findings on diagnostic imaging of breast: Secondary | ICD-10-CM

## 2015-04-07 DIAGNOSIS — I480 Paroxysmal atrial fibrillation: Secondary | ICD-10-CM | POA: Diagnosis not present

## 2015-04-07 DIAGNOSIS — E119 Type 2 diabetes mellitus without complications: Secondary | ICD-10-CM | POA: Diagnosis not present

## 2015-04-07 DIAGNOSIS — I10 Essential (primary) hypertension: Secondary | ICD-10-CM | POA: Diagnosis not present

## 2015-04-07 DIAGNOSIS — M545 Low back pain: Secondary | ICD-10-CM | POA: Diagnosis not present

## 2015-04-07 DIAGNOSIS — Z09 Encounter for follow-up examination after completed treatment for conditions other than malignant neoplasm: Secondary | ICD-10-CM | POA: Diagnosis not present

## 2015-04-08 DIAGNOSIS — Z7901 Long term (current) use of anticoagulants: Secondary | ICD-10-CM | POA: Diagnosis not present

## 2015-04-09 ENCOUNTER — Other Ambulatory Visit: Payer: Self-pay | Admitting: Gynecology

## 2015-04-09 DIAGNOSIS — N6310 Unspecified lump in the right breast, unspecified quadrant: Secondary | ICD-10-CM

## 2015-04-10 ENCOUNTER — Ambulatory Visit
Admission: RE | Admit: 2015-04-10 | Discharge: 2015-04-10 | Disposition: A | Discharge status: Home / Self Care | Payer: Medicare Other | Source: Ambulatory Visit | Attending: Gynecology | Admitting: Gynecology

## 2015-04-10 DIAGNOSIS — N6310 Unspecified lump in the right breast, unspecified quadrant: Secondary | ICD-10-CM

## 2015-04-10 DIAGNOSIS — C50211 Malignant neoplasm of upper-inner quadrant of right female breast: Secondary | ICD-10-CM | POA: Diagnosis not present

## 2015-04-10 DIAGNOSIS — N63 Unspecified lump in breast: Secondary | ICD-10-CM | POA: Diagnosis not present

## 2015-04-10 DIAGNOSIS — D0511 Intraductal carcinoma in situ of right breast: Secondary | ICD-10-CM | POA: Diagnosis not present

## 2015-04-14 DIAGNOSIS — I480 Paroxysmal atrial fibrillation: Secondary | ICD-10-CM | POA: Diagnosis not present

## 2015-04-14 DIAGNOSIS — Z7901 Long term (current) use of anticoagulants: Secondary | ICD-10-CM | POA: Diagnosis not present

## 2015-04-15 DIAGNOSIS — Z7901 Long term (current) use of anticoagulants: Secondary | ICD-10-CM | POA: Diagnosis not present

## 2015-04-15 DIAGNOSIS — C50411 Malignant neoplasm of upper-outer quadrant of right female breast: Secondary | ICD-10-CM | POA: Diagnosis not present

## 2015-04-15 DIAGNOSIS — I4891 Unspecified atrial fibrillation: Secondary | ICD-10-CM | POA: Diagnosis not present

## 2015-04-15 DIAGNOSIS — E119 Type 2 diabetes mellitus without complications: Secondary | ICD-10-CM | POA: Diagnosis not present

## 2015-04-15 DIAGNOSIS — J45909 Unspecified asthma, uncomplicated: Secondary | ICD-10-CM | POA: Diagnosis not present

## 2015-04-15 DIAGNOSIS — Z5181 Encounter for therapeutic drug level monitoring: Secondary | ICD-10-CM | POA: Diagnosis not present

## 2015-04-15 DIAGNOSIS — C8591 Non-Hodgkin lymphoma, unspecified, lymph nodes of head, face, and neck: Secondary | ICD-10-CM | POA: Diagnosis not present

## 2015-04-16 DIAGNOSIS — C50911 Malignant neoplasm of unspecified site of right female breast: Secondary | ICD-10-CM | POA: Diagnosis not present

## 2015-04-16 DIAGNOSIS — R29898 Other symptoms and signs involving the musculoskeletal system: Secondary | ICD-10-CM | POA: Diagnosis not present

## 2015-04-16 DIAGNOSIS — R197 Diarrhea, unspecified: Secondary | ICD-10-CM | POA: Diagnosis not present

## 2015-04-16 DIAGNOSIS — G4700 Insomnia, unspecified: Secondary | ICD-10-CM | POA: Diagnosis not present

## 2015-04-21 ENCOUNTER — Other Ambulatory Visit: Payer: Self-pay | Admitting: General Surgery

## 2015-04-21 DIAGNOSIS — C50311 Malignant neoplasm of lower-inner quadrant of right female breast: Secondary | ICD-10-CM | POA: Diagnosis not present

## 2015-04-21 DIAGNOSIS — Z5181 Encounter for therapeutic drug level monitoring: Secondary | ICD-10-CM | POA: Diagnosis not present

## 2015-04-21 DIAGNOSIS — C8591 Non-Hodgkin lymphoma, unspecified, lymph nodes of head, face, and neck: Secondary | ICD-10-CM | POA: Diagnosis not present

## 2015-04-21 DIAGNOSIS — Z7901 Long term (current) use of anticoagulants: Secondary | ICD-10-CM | POA: Diagnosis not present

## 2015-04-21 DIAGNOSIS — J45909 Unspecified asthma, uncomplicated: Secondary | ICD-10-CM | POA: Diagnosis not present

## 2015-04-21 DIAGNOSIS — E119 Type 2 diabetes mellitus without complications: Secondary | ICD-10-CM | POA: Diagnosis not present

## 2015-04-21 DIAGNOSIS — I4891 Unspecified atrial fibrillation: Secondary | ICD-10-CM | POA: Diagnosis not present

## 2015-04-24 ENCOUNTER — Encounter: Payer: Self-pay | Admitting: Hematology and Oncology

## 2015-04-24 ENCOUNTER — Ambulatory Visit (HOSPITAL_BASED_OUTPATIENT_CLINIC_OR_DEPARTMENT_OTHER): Payer: Medicare Other | Admitting: Hematology and Oncology

## 2015-04-24 ENCOUNTER — Other Ambulatory Visit (HOSPITAL_BASED_OUTPATIENT_CLINIC_OR_DEPARTMENT_OTHER): Payer: Medicare Other

## 2015-04-24 ENCOUNTER — Ambulatory Visit (HOSPITAL_BASED_OUTPATIENT_CLINIC_OR_DEPARTMENT_OTHER): Payer: Medicare Other

## 2015-04-24 ENCOUNTER — Ambulatory Visit: Payer: Medicare Other

## 2015-04-24 VITALS — BP 131/78 | HR 105 | Temp 97.4°F | Resp 17

## 2015-04-24 VITALS — BP 110/63 | HR 111 | Temp 98.1°F | Resp 18 | Ht 66.0 in | Wt 140.7 lb

## 2015-04-24 DIAGNOSIS — C50211 Malignant neoplasm of upper-inner quadrant of right female breast: Secondary | ICD-10-CM | POA: Diagnosis not present

## 2015-04-24 DIAGNOSIS — D801 Nonfamilial hypogammaglobulinemia: Secondary | ICD-10-CM | POA: Diagnosis not present

## 2015-04-24 DIAGNOSIS — C8591 Non-Hodgkin lymphoma, unspecified, lymph nodes of head, face, and neck: Secondary | ICD-10-CM

## 2015-04-24 DIAGNOSIS — D638 Anemia in other chronic diseases classified elsewhere: Secondary | ICD-10-CM

## 2015-04-24 LAB — COMPREHENSIVE METABOLIC PANEL
ALBUMIN: 3.7 g/dL (ref 3.5–5.0)
ALK PHOS: 94 U/L (ref 40–150)
ALT: 21 U/L (ref 0–55)
AST: 23 U/L (ref 5–34)
Anion Gap: 11 mEq/L (ref 3–11)
BUN: 15.1 mg/dL (ref 7.0–26.0)
CO2: 24 mEq/L (ref 22–29)
Calcium: 9.5 mg/dL (ref 8.4–10.4)
Chloride: 104 mEq/L (ref 98–109)
Creatinine: 0.8 mg/dL (ref 0.6–1.1)
EGFR: 76 mL/min/{1.73_m2} — ABNORMAL LOW (ref >90)
GLUCOSE: 123 mg/dL (ref 70–140)
Potassium: 3.8 mEq/L (ref 3.5–5.1)
SODIUM: 139 meq/L (ref 136–145)
Total Bilirubin: 0.44 mg/dL (ref 0.20–1.20)
Total Protein: 6.7 g/dL (ref 6.4–8.3)

## 2015-04-24 LAB — CBC WITH DIFFERENTIAL/PLATELET
BASO%: 0.5 % (ref 0.0–2.0)
BASOS ABS: 0 10*3/uL (ref 0.0–0.1)
EOS%: 1.5 % (ref 0.0–7.0)
Eosinophils Absolute: 0.1 10*3/uL (ref 0.0–0.5)
HCT: 32 % — ABNORMAL LOW (ref 34.8–46.6)
HEMOGLOBIN: 10.3 g/dL — AB (ref 11.6–15.9)
LYMPH%: 18.7 % (ref 14.0–49.7)
MCH: 27.5 pg (ref 25.1–34.0)
MCHC: 32.1 g/dL (ref 31.5–36.0)
MCV: 85.7 fL (ref 79.5–101.0)
MONO#: 0.5 10*3/uL (ref 0.1–0.9)
MONO%: 9.4 % (ref 0.0–14.0)
NEUT%: 69.9 % (ref 38.4–76.8)
NEUTROS ABS: 4 10*3/uL (ref 1.5–6.5)
Platelets: 201 10*3/uL (ref 145–400)
RBC: 3.73 10*6/uL (ref 3.70–5.45)
RDW: 17.4 % — AB (ref 11.2–14.5)
WBC: 5.7 10*3/uL (ref 3.9–10.3)
lymph#: 1.1 10*3/uL (ref 0.9–3.3)

## 2015-04-24 MED ORDER — IMMUNE GLOBULIN (HUMAN) 10 GM/100ML IV SOLN
1.0000 g/kg | Freq: Once | INTRAVENOUS | Status: AC
  Administered 2015-04-24: 65 g via INTRAVENOUS
  Filled 2015-04-24: qty 550

## 2015-04-24 MED ORDER — DIPHENHYDRAMINE HCL 25 MG PO CAPS
ORAL_CAPSULE | ORAL | Status: AC
  Filled 2015-04-24: qty 1

## 2015-04-24 MED ORDER — HEPARIN SOD (PORK) LOCK FLUSH 100 UNIT/ML IV SOLN
250.0000 [IU] | Freq: Once | INTRAVENOUS | Status: AC | PRN
  Administered 2015-04-24: 250 [IU]
  Filled 2015-04-24: qty 5

## 2015-04-24 MED ORDER — SODIUM CHLORIDE 0.9% FLUSH
10.0000 mL | INTRAVENOUS | Status: DC | PRN
  Administered 2015-04-24 (×2): 10 mL via INTRAVENOUS
  Filled 2015-04-24: qty 10

## 2015-04-24 MED ORDER — SODIUM CHLORIDE 0.9 % IJ SOLN
3.0000 mL | Freq: Once | INTRAMUSCULAR | Status: DC | PRN
  Filled 2015-04-24: qty 10

## 2015-04-24 MED ORDER — DIPHENHYDRAMINE HCL 25 MG PO CAPS
25.0000 mg | ORAL_CAPSULE | Freq: Once | ORAL | Status: AC
Start: 2015-04-24 — End: 2015-04-24
  Administered 2015-04-24: 25 mg via ORAL

## 2015-04-24 MED ORDER — DEXAMETHASONE SODIUM PHOSPHATE 100 MG/10ML IJ SOLN
10.0000 mg | Freq: Once | INTRAMUSCULAR | Status: AC
  Administered 2015-04-24: 10 mg via INTRAVENOUS
  Filled 2015-04-24: qty 1

## 2015-04-24 NOTE — Assessment & Plan Note (Signed)
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

## 2015-04-24 NOTE — Assessment & Plan Note (Signed)
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 She had recurrent sepsis and infection.  She was started on IVIG treatment last month, complicated by mild infusion reaction  I will resume IVIG again today but will premedicated her with steroids and Benadryl

## 2015-04-24 NOTE — Assessment & Plan Note (Signed)
Patient received her first cycle of maintenance Rituxan chemotherapy on 04/03/2014.   PET/CT scan in April 2016 showed complete response to treatment.  CT scan from 02/12/2015 showed no evidence of disease. We will continue treatment every other month for total of 2 years, next due in March 2017

## 2015-04-24 NOTE — Assessment & Plan Note (Signed)
This is likely due to recent treatment and anemia of chronic disease. The patient denies recent history of bleeding such as epistaxis, hematuria or hematochezia. She is asymptomatic from the anemia. I will observe for now.  

## 2015-04-24 NOTE — Patient Instructions (Signed)

## 2015-04-24 NOTE — Progress Notes (Signed)
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   01/03/2014 Bone Marrow Biopsy Accession: [id number] Bone marrow biopsy is negative   01/04/2014 Imaging ECHO showed normal EF   01/04/2014 Procedure She has placement of port   01/09/2014 - 03/07/2014 Chemotherapy She was given treatment with bendamustine with rituximab. Treatment was stopped due to severe side-effects despite significant dose adjustment for cycle 2   01/18/2014 - 02/01/2014 Hospital Admission She was admitted to the hospital from Escherichia coli sepsis with multiorgan failure and brief episodes of intubation. She was discharged to skilled nursing facility   02/26/2014 Imaging PET/CT scan showed near complete remission   02/27/2014 Adverse Reaction Cycle 2 of treatment was resumed with drastic dose adjustment to bendamustine due to recent multi-organ failure   04/03/2014 -  Chemotherapy She is started on maintenance rituximab only.   04/09/2014 - 04/12/2014 Hospital Admission The patient was admitted to the hospital with urinary tract infection  and sepsis.   06/05/2014 Imaging PET CT scan showed complete response to Rx   02/12/2015 Imaging Ct scan showed no evidence of disease. 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  she returns today to discuss recent diagnosis of breast cancer and to resume treatment with IVIG  She has made informed decision to proceed with simple mastectomy The date of surgery has not been scheduled yet  She received IVIG recently and denies recent infection  She has a very mild reaction after IVIG with some mild headache, low-grade fever resolved with Tylenol  She also has some nausea, resolved with Zofran  She denies recent chills , dysuria, frequency or urinary urgency.  REVIEW OF SYSTEMS:   Constitutional: Denies fevers, chills or abnormal weight loss Eyes: Denies blurriness of vision Ears, nose, mouth, throat, and face: Denies mucositis or sore throat Respiratory: Denies cough, dyspnea or wheezes Cardiovascular: Denies palpitation, chest discomfort or lower extremity swelling Gastrointestinal:  Denies nausea, heartburn or change in bowel habits Skin: Denies abnormal skin rashes Lymphatics: Denies new lymphadenopathy or easy bruising Neurological:Denies numbness, tingling or new weaknesses Behavioral/Psych: Mood is stable, no new  changes  All other systems were reviewed with the patient and are negative.  I have reviewed the past medical history, past surgical history, social  history and family history with the patient and they are unchanged from previous note.  ALLERGIES:  is allergic to morphine and related; multaq; demerol; oysters; penicillins; and sulfa drugs cross reactors.  MEDICATIONS:  Current Outpatient Prescriptions  Medication Sig Dispense Refill  . acetaminophen (TYLENOL) 500 MG tablet Take 1,000 mg by mouth every 6 (six) hours as needed for moderate pain, fever or headache.    . albuterol (PROAIR HFA) 108 (90 BASE) MCG/ACT inhaler Inhale 2 puffs into the lungs every 6 (six) hours as needed for wheezing or shortness of breath.    Marland Kitchen albuterol (PROVENTIL) (2.5 MG/3ML) 0.083% nebulizer solution Inhale 3 mLs into the lungs every 6 (six) hours as needed for wheezing or shortness of breath.     . Cholecalciferol (VITAMIN D) 2000 UNITS tablet Take 2,000 Units by mouth daily with lunch.    . diltiazem (CARDIZEM CD) 120 MG 24 hr capsule Take 120 mg by mouth daily.     . diphenoxylate-atropine (LOMOTIL) 2.5-0.025 MG per tablet Take 1 tablet by mouth 4 (four) times daily as needed for diarrhea or loose stools. 90 tablet 0  . flecainide (TAMBOCOR) 100 MG tablet Take 50 mg by mouth 2 (two) times daily.    . Fluticasone-Salmeterol (ADVAIR) 250-50 MCG/DOSE AEPB Inhale 2 puffs into the lungs every 12 (twelve) hours.     Marland Kitchen levothyroxine (SYNTHROID, LEVOTHROID) 75 MCG tablet Take 225 mcg by mouth daily before breakfast.    . lidocaine-prilocaine (EMLA) cream Apply 1 application topically as needed. Apply to [facility] a Cath site at least one hour prior to needle stick as needed. 30 g 3  . Magnesium Oxide 500 MG TABS Take 500 mg by mouth daily.     . metFORMIN (GLUCOPHAGE-XR) 500 MG 24 hr tablet Take 500 mg by mouth 2 (two) times daily with a meal.    . montelukast (SINGULAIR) 10 MG tablet Take 10 mg by mouth daily.    . ondansetron (ZOFRAN) 8 MG tablet Take 8 mg by mouth every 8 (eight) hours as needed for nausea or vomiting.    . pravastatin (PRAVACHOL) 40 MG tablet Take 20  mg by mouth See admin instructions. Take 1/2 tablet (20 mg) ONLY on Monday, Tuesday, Wednesday, and Thursday Nights.    . prochlorperazine (COMPAZINE) 10 MG tablet Take 1 tablet (10 mg total) by mouth every 6 (six) hours as needed for nausea (nausea). 30 tablet 6  . rivaroxaban (XARELTO) 20 MG TABS tablet Take 20 mg by mouth daily with supper.    . traMADol-acetaminophen (ULTRACET) 37.5-325 MG per tablet Take 1 tablet by mouth every 6 (six) hours as needed. (Patient taking differently: Take 1 tablet by mouth every 6 (six) hours as needed for moderate pain. ) 30 tablet 0  . vitamin B-12 (CYANOCOBALAMIN) 500 MCG tablet Take 500 mcg by mouth daily.     No current facility-administered medications for this visit.   Facility-Administered Medications Ordered in Other Visits  Medication Dose Route Frequency Provider Last Rate Last Dose  . sodium chloride flush (NS) 0.9 % injection 10 mL  10 mL Intravenous PRN Heath Lark, MD   10 mL at 04/24/15 0843    PHYSICAL EXAMINATION: ECOG PERFORMANCE STATUS: 0 - Asymptomatic  Filed Vitals:   04/24/15 0854  BP: 110/63  Pulse: 111  Temp: 98.1 F (36.7 C)  Resp:  [place]   04/24/15 0854  Weight: 140 lb 11.2 oz (63.821 kg)    GENERAL:alert, no distress and comfortable SKIN: skin color, texture, turgor are normal, no rashes or significant lesions EYES: normal, Conjunctiva are pink and non-injected, sclera clear OROPHARYNX:no exudate, no erythema and lips, buccal mucosa, and tongue normal  NECK: supple, thyroid normal size, non-tender, without nodularity LYMPH:  no palpable lymphadenopathy in the cervical, axillary or inguinal LUNGS: clear to auscultation and percussion with normal breathing effort HEART: regular rate & rhythm and no murmurs and no lower extremity edema ABDOMEN:abdomen soft, non-tender and normal bowel sounds Musculoskeletal:no cyanosis of digits and no clubbing  NEURO: alert & oriented x 3 with fluent speech, no focal  motor/sensory deficits  LABORATORY DATA:  I have reviewed the data as listed    Component Value Date/Time   NA 139 04/24/2015 0818   NA 136 03/24/2015 0500   K 3.8 04/24/2015 0818   K 3.4* 03/24/2015 0500   CL 103 03/24/2015 0500   CO2 24 04/24/2015 0818   CO2 23 03/24/2015 0500   GLUCOSE 123 04/24/2015 0818   GLUCOSE 126* 03/24/2015 0500   BUN 15.1 04/24/2015 0818   BUN 14 03/24/2015 0500   CREATININE 0.8 04/24/2015 0818   CREATININE 0.57 03/24/2015 0500   CALCIUM 9.5 04/24/2015 0818   CALCIUM 8.3* 03/24/2015 0500   PROT 6.7 04/24/2015 0818   PROT 5.1* 03/24/2015 0500   ALBUMIN 3.7 04/24/2015 0818   ALBUMIN 3.0* 03/24/2015 0500   AST 23 04/24/2015 0818   AST 19 03/24/2015 0500   ALT 21 04/24/2015 0818   ALT 15 03/24/2015 0500   ALKPHOS 94 04/24/2015 0818   ALKPHOS 56 03/24/2015 0500   BILITOT 0.44 04/24/2015 0818   BILITOT 1.0 03/24/2015 0500   GFRNONAA >60 03/24/2015 0500   GFRAA >60 03/24/2015 0500    No results found for: SPEP, UPEP  Lab Results  Component Value Date   WBC 5.7 04/24/2015   NEUTROABS 4.0 04/24/2015   HGB 10.3* 04/24/2015   HCT 32.0* 04/24/2015   MCV 85.7 04/24/2015   PLT 201 04/24/2015      Chemistry      Component Value Date/Time   NA 139 04/24/2015 0818   NA 136 03/24/2015 0500   K 3.8 04/24/2015 0818   K 3.4* 03/24/2015 0500   CL 103 03/24/2015 0500   CO2 24 04/24/2015 0818   CO2 23 03/24/2015 0500   BUN 15.1 04/24/2015 0818   BUN 14 03/24/2015 0500   CREATININE 0.8 04/24/2015 0818   CREATININE 0.57 03/24/2015 0500      Component Value Date/Time   CALCIUM 9.5 04/24/2015 0818   CALCIUM 8.3* 03/24/2015 0500   ALKPHOS 94 04/24/2015 0818   ALKPHOS 56 03/24/2015 0500   AST 23 04/24/2015 0818   AST 19 03/24/2015 0500   ALT 21 04/24/2015 0818   ALT 15 03/24/2015 0500   BILITOT 0.44 04/24/2015 0818   BILITOT 1.0 03/24/2015 0500      ASSESSMENT & PLAN:  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   PET/CT scan in April 2016 showed complete response to treatment.  CT scan from 02/12/2015 showed no evidence of disease. We will continue treatment every other month for total of 2 years, next due in March 2017   Breast cancer of upper-inner quadrant of right female breast ([place])  I am  in contact with the surgeon will plan for simple mastectomy and the new future  Pathology revealed that it is estrogen receptor positive breast cancer  I recommend she discontinue topical estrogen cream  I will discuss adjuvant treatment after her surgery next month  Anemia in chronic illness This is likely due to recent treatment and anemia of chronic disease. The patient denies recent history of bleeding such as epistaxis, hematuria or hematochezia. She is asymptomatic from the anemia. I will observe for now.      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 She had recurrent sepsis and infection.  She was started on IVIG treatment last month, complicated by mild infusion reaction  I will resume IVIG again today but will premedicated her with steroids and Benadryl     No orders of the defined types were placed in this encounter.   All questions were answered. The patient knows to call the clinic with any problems, questions or concerns. No barriers to learning was detected. I spent 20 minutes counseling the patient face to face. The total time spent in the appointment was 25 minutes and more than 50% was on counseling and review of test results     [facility], [place], MD 04/24/2015 9:33 AM

## 2015-04-24 NOTE — Patient Instructions (Signed)

## 2015-04-25 ENCOUNTER — Encounter (HOSPITAL_COMMUNITY)
Admission: RE | Admit: 2015-04-25 | Discharge: 2015-04-25 | Disposition: A | Discharge status: Home / Self Care | Payer: Medicare Other | Source: Ambulatory Visit | Attending: General Surgery | Admitting: General Surgery

## 2015-04-25 ENCOUNTER — Encounter (HOSPITAL_COMMUNITY): Payer: Self-pay

## 2015-04-25 DIAGNOSIS — R509 Fever, unspecified: Secondary | ICD-10-CM | POA: Diagnosis not present

## 2015-04-25 DIAGNOSIS — C8591 Non-Hodgkin lymphoma, unspecified, lymph nodes of head, face, and neck: Secondary | ICD-10-CM | POA: Diagnosis not present

## 2015-04-25 DIAGNOSIS — N390 Urinary tract infection, site not specified: Secondary | ICD-10-CM | POA: Diagnosis not present

## 2015-04-25 DIAGNOSIS — A419 Sepsis, unspecified organism: Secondary | ICD-10-CM | POA: Diagnosis not present

## 2015-04-25 DIAGNOSIS — I482 Chronic atrial fibrillation: Secondary | ICD-10-CM | POA: Diagnosis not present

## 2015-04-25 DIAGNOSIS — D801 Nonfamilial hypogammaglobulinemia: Secondary | ICD-10-CM | POA: Diagnosis not present

## 2015-04-25 DIAGNOSIS — R6520 Severe sepsis without septic shock: Secondary | ICD-10-CM | POA: Diagnosis not present

## 2015-04-25 DIAGNOSIS — R4182 Altered mental status, unspecified: Secondary | ICD-10-CM | POA: Diagnosis not present

## 2015-04-25 DIAGNOSIS — E1159 Type 2 diabetes mellitus with other circulatory complications: Secondary | ICD-10-CM | POA: Diagnosis not present

## 2015-04-25 HISTORY — DX: Unspecified asthma, uncomplicated: J45.909

## 2015-04-25 HISTORY — DX: Chronic kidney disease, unspecified: N18.9

## 2015-04-25 LAB — PROTIME-INR
INR: 1.39 (ref 0.00–1.49)
PROTHROMBIN TIME: 16.6 s — AB (ref 11.6–15.2)

## 2015-04-25 LAB — APTT: APTT: 27 s (ref 24–37)

## 2015-04-25 NOTE — Patient Instructions (Addendum)
EMELIA DAHILL  04/25/2015   Your procedure is scheduled on: Friday 05/02/15  Report to [facility]  Entrance take [facility]  elevators to 3rd floor to  [place] at 5:30AM.  Call this number if you have problems the morning of surgery [phone]   Remember: ONLY 1 PERSON MAY GO WITH YOU TO SHORT STAY TO GET  READY MORNING OF [place].  Do not eat food or drink liquids :After Midnight.     Take these medicines the morning of surgery with A SIP OF WATER: Diltiazem, Tambocor, Synthroid, Albuterol inhaler if needed DO NOT TAKE ANY DIABETIC MEDICATIONS DAY OF YOUR SURGERY  Please bring CPAP mask and tubing day of surgery.                               You may not have any metal on your body including hair pins and              piercings  Do not wear jewelry, make-up, lotions, powders or perfumes, deodorant             Do not wear nail polish.  Do not shave  48 hours prior to surgery.              Men may shave face and neck.   Do not bring valuables to the hospital. [place].  Contacts, dentures or bridgework may not be worn into surgery.  Leave suitcase in the car. After surgery it may be brought to your room.     Patients discharged the day of surgery will not be allowed to drive home.  Name and phone number of your driver:  Special Instructions: N/A              Please read over the following fact sheets you were given: _____________________________________________________________________             [facility] - Preparing for Surgery Before surgery, you can play an important role.  Because skin is not sterile, your skin needs to be as free of germs as possible.  You can reduce the number of germs on your skin by washing with CHG (chlorahexidine gluconate) soap before surgery.  CHG is an antiseptic cleaner which kills germs and bonds with the skin to continue killing germs even after  washing. Please DO NOT use if you have an allergy to CHG or antibacterial soaps.  If your skin becomes reddened/irritated stop using the CHG and inform your nurse when you arrive at Short Stay. Do not shave (including legs and underarms) for at least 48 hours prior to the first CHG shower.  You may shave your face/neck. Please follow these instructions carefully:  1.  Shower with CHG Soap the night before surgery and the  morning of Surgery.  2.  If you choose to wash your hair, wash your hair first as usual with your  normal  shampoo.  3.  After you shampoo, rinse your hair and body thoroughly to remove the  shampoo.                           4.  Use CHG as you would any other  liquid soap.  You can apply chg directly  to the skin and wash                       Gently with a scrungie or clean washcloth.  5.  Apply the CHG Soap to your body ONLY FROM THE NECK DOWN.   Do not use on face/ open                           Wound or open sores. Avoid contact with eyes, ears mouth and genitals (private parts).                       Wash face,  Genitals (private parts) with your normal soap.             6.  Wash thoroughly, paying special attention to the area where your surgery  will be performed.  7.  Thoroughly rinse your body with warm water from the neck down.  8.  DO NOT shower/wash with your normal soap after using and rinsing off  the CHG Soap.                9.  Pat yourself dry with a clean towel.            10.  Wear clean pajamas.            11.  Place clean sheets on your bed the night of your first shower and do not  sleep with pets. Day of Surgery : Do not apply any lotions/deodorants the morning of surgery.  Please wear clean clothes to the hospital/surgery center.  FAILURE TO FOLLOW THESE INSTRUCTIONS MAY RESULT IN THE CANCELLATION OF YOUR SURGERY PATIENT SIGNATURE_________________________________  NURSE  SIGNATURE__________________________________  ________________________________________________________________________

## 2015-04-25 NOTE — Pre-Procedure Instructions (Addendum)
CBC with diff, CMP - 04/24/15 - epic EKG - 12/24/14 - epic 2V CXR - 03/02/15 - epic 2D Echo - 01/04/14 - epic  At pre-op appointment, pt has heart rate of 110-115. Pt has history of intermittent A fib. Spoke with Dr. Marcie Bal and received order for EKG.  EKG showed A flutter. Reviewed with Dr. Marcie Bal. No new orders at this time.

## 2015-04-26 ENCOUNTER — Other Ambulatory Visit: Payer: Self-pay

## 2015-04-26 ENCOUNTER — Emergency Department (HOSPITAL_COMMUNITY): Payer: Medicare Other

## 2015-04-26 ENCOUNTER — Encounter (HOSPITAL_COMMUNITY): Payer: Self-pay | Admitting: *Deleted

## 2015-04-26 ENCOUNTER — Inpatient Hospital Stay (HOSPITAL_COMMUNITY)
Admission: EM | Admit: 2015-04-26 | Discharge: 2015-04-30 | DRG: 872 | Disposition: A | Discharge status: Home Health | Payer: Medicare Other | Attending: Internal Medicine | Admitting: Internal Medicine

## 2015-04-26 DIAGNOSIS — C8591 Non-Hodgkin lymphoma, unspecified, lymph nodes of head, face, and neck: Secondary | ICD-10-CM | POA: Diagnosis present

## 2015-04-26 DIAGNOSIS — G4730 Sleep apnea, unspecified: Secondary | ICD-10-CM | POA: Diagnosis present

## 2015-04-26 DIAGNOSIS — D801 Nonfamilial hypogammaglobulinemia: Secondary | ICD-10-CM | POA: Diagnosis not present

## 2015-04-26 DIAGNOSIS — Z888 Allergy status to other drugs, medicaments and biological substances status: Secondary | ICD-10-CM

## 2015-04-26 DIAGNOSIS — E119 Type 2 diabetes mellitus without complications: Secondary | ICD-10-CM | POA: Diagnosis not present

## 2015-04-26 DIAGNOSIS — Z882 Allergy status to sulfonamides status: Secondary | ICD-10-CM

## 2015-04-26 DIAGNOSIS — Z01812 Encounter for preprocedural laboratory examination: Secondary | ICD-10-CM | POA: Diagnosis not present

## 2015-04-26 DIAGNOSIS — A419 Sepsis, unspecified organism: Principal | ICD-10-CM | POA: Diagnosis present

## 2015-04-26 DIAGNOSIS — E039 Hypothyroidism, unspecified: Secondary | ICD-10-CM | POA: Diagnosis present

## 2015-04-26 DIAGNOSIS — E1169 Type 2 diabetes mellitus with other specified complication: Secondary | ICD-10-CM | POA: Diagnosis present

## 2015-04-26 DIAGNOSIS — R0602 Shortness of breath: Secondary | ICD-10-CM

## 2015-04-26 DIAGNOSIS — E785 Hyperlipidemia, unspecified: Secondary | ICD-10-CM | POA: Diagnosis present

## 2015-04-26 DIAGNOSIS — Z7951 Long term (current) use of inhaled steroids: Secondary | ICD-10-CM | POA: Diagnosis not present

## 2015-04-26 DIAGNOSIS — R6520 Severe sepsis without septic shock: Secondary | ICD-10-CM | POA: Diagnosis not present

## 2015-04-26 DIAGNOSIS — C50311 Malignant neoplasm of lower-inner quadrant of right female breast: Secondary | ICD-10-CM | POA: Diagnosis present

## 2015-04-26 DIAGNOSIS — E1159 Type 2 diabetes mellitus with other circulatory complications: Secondary | ICD-10-CM | POA: Diagnosis present

## 2015-04-26 DIAGNOSIS — Z7984 Long term (current) use of oral hypoglycemic drugs: Secondary | ICD-10-CM

## 2015-04-26 DIAGNOSIS — R410 Disorientation, unspecified: Secondary | ICD-10-CM | POA: Diagnosis present

## 2015-04-26 DIAGNOSIS — I482 Chronic atrial fibrillation: Secondary | ICD-10-CM | POA: Diagnosis not present

## 2015-04-26 DIAGNOSIS — Z170 Estrogen receptor positive status [ER+]: Secondary | ICD-10-CM

## 2015-04-26 DIAGNOSIS — Z885 Allergy status to narcotic agent status: Secondary | ICD-10-CM | POA: Diagnosis not present

## 2015-04-26 DIAGNOSIS — J45998 Other asthma: Secondary | ICD-10-CM | POA: Diagnosis present

## 2015-04-26 DIAGNOSIS — Z7901 Long term (current) use of anticoagulants: Secondary | ICD-10-CM | POA: Diagnosis not present

## 2015-04-26 DIAGNOSIS — Z880 Allergy status to penicillin: Secondary | ICD-10-CM

## 2015-04-26 DIAGNOSIS — I480 Paroxysmal atrial fibrillation: Secondary | ICD-10-CM | POA: Diagnosis present

## 2015-04-26 DIAGNOSIS — Z01810 Encounter for preprocedural cardiovascular examination: Secondary | ICD-10-CM

## 2015-04-26 DIAGNOSIS — R509 Fever, unspecified: Secondary | ICD-10-CM | POA: Diagnosis not present

## 2015-04-26 DIAGNOSIS — Z91013 Allergy to seafood: Secondary | ICD-10-CM

## 2015-04-26 DIAGNOSIS — N390 Urinary tract infection, site not specified: Secondary | ICD-10-CM | POA: Diagnosis present

## 2015-04-26 DIAGNOSIS — R4182 Altered mental status, unspecified: Secondary | ICD-10-CM | POA: Diagnosis not present

## 2015-04-26 DIAGNOSIS — E876 Hypokalemia: Secondary | ICD-10-CM | POA: Diagnosis present

## 2015-04-26 DIAGNOSIS — R402411 Glasgow coma scale score 13-15, in the field [EMT or ambulance]: Secondary | ICD-10-CM | POA: Diagnosis not present

## 2015-04-26 LAB — URINALYSIS, ROUTINE W REFLEX MICROSCOPIC
Bilirubin Urine: NEGATIVE
Glucose, UA: NEGATIVE mg/dL
Hgb urine dipstick: NEGATIVE
Ketones, ur: NEGATIVE mg/dL
Nitrite: POSITIVE — AB
PH: 7 (ref 5.0–8.0)
Protein, ur: NEGATIVE mg/dL
SPECIFIC GRAVITY, URINE: 1.019 (ref 1.005–1.030)

## 2015-04-26 LAB — CBC WITH DIFFERENTIAL/PLATELET
Basophils Absolute: 0 10*3/uL (ref 0.0–0.1)
Basophils Relative: 0 %
Eosinophils Absolute: 0 10*3/uL (ref 0.0–0.7)
Eosinophils Relative: 0 %
HEMATOCRIT: 33.1 % — AB (ref 36.0–46.0)
HEMOGLOBIN: 10.2 g/dL — AB (ref 12.0–15.0)
LYMPHS ABS: 0.8 10*3/uL (ref 0.7–4.0)
Lymphocytes Relative: 4 %
MCH: 27.1 pg (ref 26.0–34.0)
MCHC: 30.8 g/dL (ref 30.0–36.0)
MCV: 88 fL (ref 78.0–100.0)
MONOS PCT: 9 %
Monocytes Absolute: 1.6 10*3/uL — ABNORMAL HIGH (ref 0.1–1.0)
NEUTROS PCT: 87 %
Neutro Abs: 15.3 10*3/uL — ABNORMAL HIGH (ref 1.7–7.7)
Platelets: 168 10*3/uL (ref 150–400)
RBC: 3.76 MIL/uL — ABNORMAL LOW (ref 3.87–5.11)
RDW: 16.1 % — ABNORMAL HIGH (ref 11.5–15.5)
WBC: 17.7 10*3/uL — ABNORMAL HIGH (ref 4.0–10.5)

## 2015-04-26 LAB — COMPREHENSIVE METABOLIC PANEL WITH GFR
ALT: 23 U/L (ref 14–54)
AST: 44 U/L — ABNORMAL HIGH (ref 15–41)
Albumin: 3.5 g/dL (ref 3.5–5.0)
Alkaline Phosphatase: 60 U/L (ref 38–126)
Anion gap: 13 (ref 5–15)
BUN: 16 mg/dL (ref 6–20)
CO2: 22 mmol/L (ref 22–32)
Calcium: 9.2 mg/dL (ref 8.9–10.3)
Chloride: 100 mmol/L — ABNORMAL LOW (ref 101–111)
Creatinine, Ser: 0.82 mg/dL (ref 0.44–1.00)
GFR calc Af Amer: >60 mL/min
GFR calc non Af Amer: >60 mL/min
Glucose, Bld: 133 mg/dL — ABNORMAL HIGH (ref 65–99)
Potassium: 3.6 mmol/L (ref 3.5–5.1)
Sodium: 135 mmol/L (ref 135–145)
Total Bilirubin: 0.5 mg/dL (ref 0.3–1.2)
Total Protein: 7.3 g/dL (ref 6.5–8.1)

## 2015-04-26 LAB — URINE MICROSCOPIC-ADD ON

## 2015-04-26 LAB — I-STAT CG4 LACTIC ACID, ED: Lactic Acid, Venous: 2.67 mmol/L (ref 0.5–2.0)

## 2015-04-26 LAB — PROTIME-INR
INR: 1.31 (ref 0.00–1.49)
Prothrombin Time: 16.4 seconds — ABNORMAL HIGH (ref 11.6–15.2)

## 2015-04-26 LAB — HEMOGLOBIN A1C
Hgb A1c MFr Bld: 5.5 % (ref 4.8–5.6)
Mean Plasma Glucose: 111 mg/dL

## 2015-04-26 MED ORDER — AZTREONAM 2 G IJ SOLR: 2.0000 g | Freq: Once | INTRAMUSCULAR | Status: DC

## 2015-04-26 MED ORDER — SODIUM CHLORIDE 0.9 % IV BOLUS (SEPSIS)
1000.0000 mL | Freq: Once | INTRAVENOUS | Status: AC
  Administered 2015-04-26: 1000 mL via INTRAVENOUS

## 2015-04-26 MED ORDER — DEXTROSE 5 % IV SOLN
2.0000 g | Freq: Three times a day (TID) | INTRAVENOUS | Status: DC
  Administered 2015-04-27 – 2015-04-29 (×8): 2 g via INTRAVENOUS
  Filled 2015-04-26 (×9): qty 2

## 2015-04-26 MED ORDER — DEXTROSE 5 % IV SOLN: 1.0000 g | Freq: Once | INTRAVENOUS | Status: DC

## 2015-04-26 MED ORDER — LEVOFLOXACIN IN D5W 750 MG/150ML IV SOLN: 750.0000 mg | Freq: Once | INTRAVENOUS | Status: DC

## 2015-04-26 MED ORDER — SODIUM CHLORIDE 0.9 % IV BOLUS (SEPSIS)
1000.0000 mL | INTRAVENOUS | Status: AC
  Administered 2015-04-26 (×2): 1000 mL via INTRAVENOUS

## 2015-04-26 MED ORDER — ACETAMINOPHEN 650 MG RE SUPP
650.0000 mg | Freq: Once | RECTAL | Status: AC
  Administered 2015-04-26: 650 mg via RECTAL
  Filled 2015-04-26: qty 1

## 2015-04-26 MED ORDER — DEXTROSE 5 % IV SOLN
2.0000 g | INTRAVENOUS | Status: AC
  Administered 2015-04-26: 2 g via INTRAVENOUS
  Filled 2015-04-26 (×2): qty 2

## 2015-04-26 MED ORDER — VANCOMYCIN HCL 500 MG IV SOLR
500.0000 mg | Freq: Two times a day (BID) | INTRAVENOUS | Status: DC
  Administered 2015-04-27 – 2015-04-28 (×2): 500 mg via INTRAVENOUS
  Filled 2015-04-26 (×6): qty 500

## 2015-04-26 MED ORDER — VANCOMYCIN HCL IN DEXTROSE 1-5 GM/200ML-% IV SOLN
1000.0000 mg | Freq: Once | INTRAVENOUS | Status: AC
  Administered 2015-04-26: 1000 mg via INTRAVENOUS
  Filled 2015-04-26 (×2): qty 200

## 2015-04-26 NOTE — ED Notes (Signed)
Pt to ED from home by EMS c/o altered mental status onset at 1300 today. Pt presents with generalized weakness; at baseline, pt is self-sufficient. Hx of breast cancer; Family reported pt receives a "preventive lymphoma" treatment and in the past has obtained a UTI following treatment, which has presented the same. Pt alert x 2, had been c/o burning with urination

## 2015-04-26 NOTE — ED Notes (Signed)
Daughter at bedside and update given.

## 2015-04-26 NOTE — Progress Notes (Signed)
Pharmacy Antibiotic Note  Patty [name] is a 75 y.o. female with history of breast CA and malignant lymphomas (the patient is noted to actively be receiving Rituxan every other month for suppressive therapy for lymphoma) who [facility] the [facility] on 04/26[facility]15 with AMS and burning with urination. Pharmacy has been consulted to start Vancomycin + Cefepime for r/o sepsis. SCr 0.8, CrCl~50-60 ml/min.   Code sepsis called at 2140  Plan: 1. Vancomycin 1g IV x 1 dose now followed by 500 mg IV every 12 hours 2. Start Cefepime 2g IV every 8 hours 3. Will continue to follow renal function, culture results, LOT, and antibiotic de-escalation plans    Height: 5\' 6"  (167.6 cm) Weight: 141 lb (63.957 kg) IBW/kg (Calculated) : 59.3  Temp (24hrs), Avg:102.3 F (39.1 C), Min:102.3 F (39.1 C), Max:102.3 F (39.1 C)   Recent Labs Lab 04/24/15 0818  WBC 5.7  CREATININE 0.8    Estimated Creatinine Clearance: 56 mL/min (by C-G formula based on Cr of 0.8).    Allergies  Allergen Reactions  . Morphine And Related Anaphylaxis and Other (See Comments)    Pt states she stopped breathing post op- "went into resp. arrest"  . Multaq [Dronedarone] Other (See Comments)    Reaction:  Blood in urine and elevated liver enzymes.   . Demerol Nausea And Vomiting  . Oysters [Shellfish Allergy] Rash    & CLAMS  . Penicillins Itching and Rash    Has patient had a PCN reaction causing immediate rash, facial/tongue/throat swelling, SOB or lightheadedness with hypotension: No Has patient had a PCN reaction causing severe rash involving mucus membranes or skin necrosis: Yes Has patient had a PCN reaction that required hospitalization No Has patient had a PCN reaction occurring within the last 10 years: No If all of the above answers are "NO", then may proceed with Cephalosporin use.   . Sulfa Drugs Cross Reactors Rash    Antimicrobials this admission: Vanc 2/25 >> Cefepime 2/25 >>  Dose adjustments this  admission: n/a  Microbiology results: 2/25 UCx >> 2/25 BCx >>  Thank you for allowing pharmacy to be a part of this patient's care.  Alycia Rossetti, PharmD, BCPS Clinical Pharmacist Pager: [phone] 04/26/2015 9:45 PM

## 2015-04-26 NOTE — ED Notes (Signed)
Daughter of pt is in the waiting room. Understands she will have to wait before she can go back to pt's room.

## 2015-04-26 NOTE — Progress Notes (Signed)
Pharmacy Code Sepsis Protocol  Time of code sepsis page: 2140 [x]  Antibiotics delivered at 2146 - however RN had already pulled and hung Vanc and was mixing Cefepime to hang concurrently.  Were antibiotics ordered at the time of the code sepsis page? Yes Was it required to contact the physician? []  Physician not contacted []  Physician contacted to order antibiotics for code sepsis [x]  Physician contacted to recommend changing antibiotics - MD called Rx to discuss pt's allergy and choose an appropriate regimen   Pharmacy consulted for: Vancomycin + Cefepime  Anti-infectives    Start     Dose/Rate Route Frequency Ordered Stop   04/26/15 2145  levofloxacin (LEVAQUIN) IVPB 750 mg  Status:  Discontinued     750 mg 100 mL/hr over 90 Minutes Intravenous  Once 04/26/15 2131 04/26/15 2139   04/26/15 2145  aztreonam (AZACTAM) 2 g in dextrose 5 % 50 mL IVPB  Status:  Discontinued     2 g 100 mL/hr over 30 Minutes Intravenous  Once 04/26/15 2131 04/26/15 2131   04/26/15 2145  vancomycin (VANCOCIN) IVPB 1000 mg/200 mL premix     1,000 mg 200 mL/hr over 60 Minutes Intravenous  Once 04/26/15 2131     04/26/15 2145  aztreonam (AZACTAM) 1 g in dextrose 5 % 50 mL IVPB  Status:  Discontinued     1 g 100 mL/hr over 30 Minutes Intravenous  Once 04/26/15 2131 04/26/15 2139   04/26/15 2145  ceFEPIme (MAXIPIME) 2 g in dextrose 5 % 50 mL IVPB     2 g 100 mL/hr over 30 Minutes Intravenous STAT 04/26/15 2142 04/27/15 2145        Nurse education provided: []  Minutes left to administer antibiotics to achieve 1 hour goal [x]  Correct order of antibiotic administration [x]  Antibiotic Y-site compatibilities     Alycia Rossetti, PharmD, BCPS Clinical Pharmacist Pager: [phone] 04/26/2015 9:57 PM

## 2015-04-26 NOTE — H&P (Signed)
[facility] Admission History and Physical       [name] [id number] DOB: March 13, 1940 DOA: 04/26/2015  Referring physician: EDP PCP: Horatio Pel, MD  Specialists:   Chief Complaint: Fever Chills and Confusion  HPI: Patty [name] is a 75 y.o. female with a history of NHL, Acquired Hypogammaglobinemia, Chronic Atrial fibrillation, DM2, Hypothyroid who presents to the ED with complaints of Fever and chills and confusion today.   Her family reports that she usually gets a UTI a few days after her Chemo Rx.   She found to have a temperature of 102.3 along with hypotension and tachycardia, and a Sepsis Workup was initiated she was placed on IV Vancomycin and Cefepime and was referred for admission.     Review of Systems:  Constitutional: No Weight Loss, No Weight Gain, Night Sweats, +Fevers, +Chills, Dizziness, Light Headedness, Fatigue, or Generalized Weakness HEENT: No Headaches, Difficulty Swallowing,Tooth/Dental Problems,Sore Throat,  No Sneezing, Rhinitis, Ear Ache, Nasal Congestion, or Post Nasal Drip,  Cardio-vascular:  No Chest pain, Orthopnea, PND, Edema in Lower Extremities, Anasarca, Dizziness, Palpitations  Resp: No Dyspnea, No DOE, No Productive Cough, No Non-Productive Cough, No Hemoptysis, No Wheezing.    GI: No Heartburn, Indigestion, Abdominal Pain, Nausea, Vomiting, Diarrhea, Constipation, Hematemesis, Hematochezia, Melena, Change in Bowel Habits,  Loss of Appetite  GU:  +Dysuria, No Change in Color of Urine, No Urgency or Urinary Frequency, No Flank pain.  Musculoskeletal: No Joint Pain or Swelling, No Decreased Range of Motion, No Back Pain.  Neurologic: No Syncope, No Seizures, Muscle Weakness, Paresthesia, Vision Disturbance or Loss, No Diplopia, No Vertigo, No Difficulty Walking,  Skin: No Rash or Lesions. Psych: No Change in Mood or Affect, No Depression or Anxiety, No Memory loss, No Confusion, or Hallucinations   Past Medical History    Diagnosis Date  . Dysrhythmia     A fib  . Sleep apnea   . Asthma   . Pneumonia   . Hypothyroidism   . Diabetes mellitus without complication ([place])   . Chronic kidney disease     recurring UTI  . Arthritis   . Cancer [facility])     lymphoma and breast     Past Surgical History  Procedure Laterality Date  . Appendectomy  1953  . Partial hysterectomy  1972  . Cholecystectomy  1993  . Back surgery      x2  . Cataract extraction    . Cardiac catheterization  10/10/2008    Nonobstructive CAD  . Abdominal hysterectomy  1972    TAH  . Foot surgery    . Tonsillectomy    . Pubovag repair with sling    . Skin tag removal  2012    leg  . Cardioversion  04/13/2011    Procedure: CARDIOVERSION;  Surgeon: Laverda Page, MD;  Location: [place];  Service: Cardiovascular;  Laterality: N/A;  . Eye surgery      /w IOL, post cataracts removed   . Mass biopsy Left 12/07/2013    Procedure: EXCISIONAL BIOPSY LEFT NECK MASS ;  Surgeon: Jerrell Belfast, MD;  Location: [place];  Service: ENT;  Laterality: Left;  . Lumbar laminectomy        Prior to Admission medications   Medication Sig Start Date End Date Taking? Authorizing Provider  acetaminophen (TYLENOL) 500 MG tablet Take 1,000 mg by mouth every 6 (six) hours as needed for moderate pain, fever or headache.   Yes Historical Provider, MD  albuterol (PROAIR HFA) 108 (90  BASE) MCG/ACT inhaler Inhale 2 puffs into the lungs every 6 (six) hours as needed for wheezing or shortness of breath.   Yes Historical Provider, MD  albuterol (PROVENTIL) (2.5 MG/3ML) 0.083% nebulizer solution Inhale 3 mLs into the lungs every 6 (six) hours as needed for wheezing or shortness of breath.  02/27/15  Yes Historical Provider, MD  Cholecalciferol (VITAMIN D) 2000 UNITS tablet Take 2,000 Units by mouth daily with lunch.   Yes Historical Provider, MD  diltiazem (CARDIZEM CD) 120 MG 24 hr capsule Take 120 mg by mouth daily.  06/07/11  Yes Peter M Martinique, MD   diphenoxylate-atropine (LOMOTIL) 2.5-0.025 MG per tablet Take 1 tablet by mouth 4 (four) times daily as needed for diarrhea or loose stools. 08/08/14  Yes Heath Lark, MD  flecainide (TAMBOCOR) 100 MG tablet Take 50 mg by mouth 2 (two) times daily.   Yes Historical Provider, MD  Fluticasone-Salmeterol (ADVAIR) 250-50 MCG/DOSE AEPB Inhale 2 puffs into the lungs every 12 (twelve) hours.    Yes Historical Provider, MD  levothyroxine (SYNTHROID, LEVOTHROID) 75 MCG tablet Take 225 mcg by mouth daily before breakfast.   Yes Historical Provider, MD  lidocaine-prilocaine (EMLA) cream Apply 1 application topically as needed. Apply to [facility] a Cath site at least one hour prior to needle stick as needed. 01/04/14  Yes Heath Lark, MD  Magnesium Oxide 500 MG TABS Take 500 mg by mouth daily.    Yes Historical Provider, MD  metFORMIN (GLUCOPHAGE-XR) 500 MG 24 hr tablet Take 500 mg by mouth 2 (two) times daily with a meal.   Yes Historical Provider, MD  montelukast (SINGULAIR) 10 MG tablet Take 10 mg by mouth daily.   Yes Historical Provider, MD  ondansetron (ZOFRAN) 8 MG tablet Take 8 mg by mouth every 8 (eight) hours as needed for nausea or vomiting.   Yes Historical Provider, MD  pravastatin (PRAVACHOL) 40 MG tablet Take 20 mg by mouth See admin instructions. Take 1/2 tablet (20 mg) ONLY on Monday, Tuesday, Wednesday, and Thursday Nights.   Yes Historical Provider, MD  prochlorperazine (COMPAZINE) 10 MG tablet Take 1 tablet (10 mg total) by mouth every 6 (six) hours as needed for nausea (nausea). 11/13/14  Yes Heath Lark, MD  rivaroxaban (XARELTO) 20 MG TABS tablet Take 20 mg by mouth daily with supper.   Yes Historical Provider, MD  traMADol-acetaminophen (ULTRACET) 37.5-325 MG per tablet Take 1 tablet by mouth every 6 (six) hours as needed. Patient taking differently: Take 1 tablet by mouth every 6 (six) hours as needed for moderate pain.  02/01/14  Yes Venetia Maxon Rama, MD  vitamin B-12 (CYANOCOBALAMIN) 500 MCG tablet  Take 500 mcg by mouth daily.   Yes Historical Provider, MD  enoxaparin (LOVENOX) 60 MG/0.6ML injection Inject 60 mg into the skin every 12 (twelve) hours. STOP XARELTO 2/28 then [place] lovenox for 2 days 04/18/15   Historical Provider, MD  [place]    Historical Provider, MD     Allergies  Allergen Reactions  . Morphine And Related Anaphylaxis and Other (See Comments)    Pt states she stopped breathing post op- "went into resp. arrest"  . Multaq [Dronedarone] Other (See Comments)    Reaction:  Blood in urine and elevated liver enzymes.   . Demerol Nausea And Vomiting  . Oysters [Shellfish Allergy] Rash    & CLAMS  . Penicillins Itching and Rash    Has patient had a PCN reaction causing immediate rash, facial/tongue/throat swelling, SOB  or lightheadedness with hypotension: No Has patient had a PCN reaction causing severe rash involving mucus membranes or skin necrosis: Yes Has patient had a PCN reaction that required hospitalization No Has patient had a PCN reaction occurring within the last 10 years: No If all of the above answers are "NO", then may proceed with Cephalosporin use.   . Sulfa Drugs Cross Reactors Rash    Social History:  reports that she has never smoked. She has never used smokeless tobacco. She reports that she does not drink alcohol or use illicit drugs.    Family History  Problem Relation Age of Onset  . Stroke Father     at 74  . Heart attack Father     at 72  . Hypertension Father   . Heart disease Mother   . Arthritis Mother   . Breast cancer Mother     Age 23  . Heart failure Mother   . Heart attack Brother   . Hypertension Brother   . Diabetes Sister        Physical Exam:  GEN: Pleasant Elderly Well Nourished Well Developed  75 y.o. Caucasian female examined and in no acute distress; cooperative with exam Filed Vitals:   04/26/15 2300 04/26/15 2315 04/26/15 2322 04/26/15 2330  BP: 98/56 95/56 95/56  91/53  Pulse:  111 112 111 112  Temp:      TempSrc:      Resp: 22 25 20 22   Height:      Weight:      SpO2: 94% 93% 99% 93%   Blood pressure 91/53, pulse 112, temperature 102.3 F (39.1 C), temperature source Rectal, resp. rate 22, height 5\' 6"  (1.676 m), weight 63.957 kg (141 lb), SpO2 93 %. PSYCH: She is alert and oriented x4; does not appear anxious does not appear depressed; affect is normal HEENT: Normocephalic and Atraumatic, Mucous membranes pink; PERRLA; EOM intact; Fundi:  Benign;  No scleral icterus, Nares: Patent, Oropharynx: Clear,  Fair Dentition,    Neck:  FROM, No Cervical Lymphadenopathy nor Thyromegaly or Carotid Bruit; No JVD; Breasts:: Not examined CHEST WALL: No tenderness CHEST: Normal respiration, clear to auscultation bilaterally HEART: Regular rate and rhythm; no murmurs rubs or gallops BACK: No kyphosis or scoliosis; No CVA tenderness ABDOMEN: Positive Bowel Sounds, Soft Non-Tender, No Rebound or Guarding; No Masses, No Organomegaly. Rectal Exam: Not done EXTREMITIES: No Cyanosis, Clubbing, or Edema; No Ulcerations. Genitalia: not examined PULSES: 2+ and symmetric SKIN: Normal hydration no rash or ulceration CNS:  Alert and Oriented x 4, No Focal Deficits Vascular: pulses palpable throughout    Labs on Admission:  Basic Metabolic Panel:  Recent Labs Lab 04/24/15 0818 04/26/15 2141  NA 139 135  K 3.8 3.6  CL  --  100*  CO2 24 22  GLUCOSE 123 133*  BUN 15.1 16  CREATININE 0.8 0.82  CALCIUM 9.5 9.2   Liver Function Tests:  Recent Labs Lab 04/24/15 0818 04/26/15 2141  AST 23 44*  ALT 21 23  ALKPHOS 94 60  BILITOT 0.44 0.5  PROT 6.7 7.3  ALBUMIN 3.7 3.5   No results for input(s): LIPASE, AMYLASE in the last 168 hours. No results for input(s): AMMONIA in the last 168 hours. CBC:  Recent Labs Lab 04/24/15 0818 04/26/15 2141  WBC 5.7 17.7*  NEUTROABS 4.0 15.3*  HGB 10.3* 10.2*  HCT 32.0* 33.1*  MCV 85.7 88.0  PLT 201 168   Cardiac Enzymes: No  results for input(s): CKTOTAL, CKMB, CKMBINDEX,  TROPONINI in the last 168 hours.  BNP (last 3 results) No results for input(s): BNP in the last 8760 hours.  ProBNP (last 3 results) No results for input(s): PROBNP in the last 8760 hours.  CBG: No results for input(s): GLUCAP in the last 168 hours.  Radiological Exams on Admission: Dg Chest Port 1 View  04/26/2015  CLINICAL DATA:  Fever.  Generalized weakness. EXAM: PORTABLE CHEST 1 VIEW COMPARISON:  03/02/2015 FINDINGS: Right chest port remains in place. Previous patchy right lung base opacity has resolved. No new consolidation. Heart size and mediastinal contours are unchanged, borderline mild cardiomegaly. Vascular congestion versus vascular prominence secondary to technique. No large pleural effusion or pneumothorax. IMPRESSION: 1. Question of vascular congestion versus vascular prominence secondary to technique. 2. Previous right lung base opacity has resolved.  No new pneumonia. Electronically Signed   By: Jeb Levering M.D.   On: 04/26/2015 22:01     EKG: Independently reviewed.        Assessment/Plan:     75 y.o. female with  Principal Problem:    1.    Sepsis (HCC)/Sepsis due to urinary tract infection (HCC)     IV Vancomycin and Cefepime     IVFs   Active Problems:       2.    Chronic atrial fibrillation (HCC)    On Xarelto    Diltiazem on hold due to Hypotension       3.    Controlled type 2 diabetes mellitus without complication, without long-term current use of insulin (HCC)    Hold Metformin Rx    SSI coverage      4.    Dyslipidemia associated with type 2 diabetes mellitus ([place])           5.    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    DVT Prophylaxis    On Xarelto Rx     Code Status:     FULL CODE   Family Communication:   Family at Bedside ( [place] and Son-In-Law)   Disposition Plan:    Inpatient  Status        Time spent:  [place] [facility] Pager [phone]   If [place] Please Contact the Day Rounding Team MD for [facility]  If 7PM-7AM, Please Contact Night-Floor Coverage  [email or web address] Password [facility] 04/26/2015, 11:46 PM     ADDENDUM:   Patient was seen and examined on 04/26/2015

## 2015-04-26 NOTE — ED Notes (Signed)
This Rn has asked Dr. Leonette Monarch for suppository tylenol. Order to be put in computer by Dr. Leonette Monarch,

## 2015-04-26 NOTE — ED Provider Notes (Signed)
CSN: [id number]     Arrival date & time 04/26/15  2112 History   First MD Initiated Contact with Patient 04/26/15 2113     Chief Complaint  Patient presents with  . Altered Mental Status     (Consider location/radiation/quality/duration/timing/severity/associated sxs/prior Treatment) Patient is a 75 y.o. female presenting with altered mental status. The history is provided by the EMS personnel. The history is limited by the condition of the patient.  Altered Mental Status Presenting symptoms: disorientation   Severity:  Moderate Most recent episode:  Today Episode history:  Single Timing:  Constant Progression:  Unchanged Chronicity:  Recurrent (Per EMS, the family reports that this occurs every time she gets prophylactic treatment for lymphoma, this is usually associated with urinary tract infections.) Associated symptoms: no vomiting     Past Medical History  Diagnosis Date  . Dysrhythmia     A fib  . Sleep apnea   . Asthma   . Pneumonia   . Hypothyroidism   . Diabetes mellitus without complication ([place])   . Chronic kidney disease     recurring UTI  . Arthritis   . Cancer [facility])     lymphoma and breast   Past Surgical History  Procedure Laterality Date  . Appendectomy  1953  . Partial hysterectomy  1972  . Cholecystectomy  1993  . Back surgery      x2  . Cataract extraction    . Cardiac catheterization  10/10/2008    Nonobstructive CAD  . Abdominal hysterectomy  1972    TAH  . Foot surgery    . Tonsillectomy    . Pubovag repair with sling    . Skin tag removal  2012    leg  . Cardioversion  04/13/2011    Procedure: CARDIOVERSION;  Surgeon: Laverda Page, MD;  Location: [place];  Service: Cardiovascular;  Laterality: N/A;  . Eye surgery      /w IOL, post cataracts removed   . Mass biopsy Left 12/07/2013    Procedure: EXCISIONAL BIOPSY LEFT NECK MASS ;  Surgeon: Jerrell Belfast, MD;  Location: [place];  Service: ENT;  Laterality: Left;  . Lumbar laminectomy      Family History  Problem Relation Age of Onset  . Stroke Father     at 67  . Heart attack Father     at 67  . Hypertension Father   . Heart disease Mother   . Arthritis Mother   . Breast cancer Mother     Age 42  . Heart failure Mother   . Heart attack Brother   . Hypertension Brother   . Diabetes Sister    Social History  Substance Use Topics  . Smoking status: Never Smoker   . Smokeless tobacco: Never Used  . Alcohol Use: No   OB History    Gravida Para Term Preterm AB TAB SAB Ectopic Multiple Living   2 2 2       2      Review of Systems  Unable to perform ROS: Mental status change  Gastrointestinal: Negative for vomiting.      Allergies  Morphine and related; Multaq; Demerol; Oysters; Penicillins; and Sulfa drugs cross reactors  Home Medications   Prior to Admission medications   Medication Sig Start Date End Date Taking? Authorizing Provider  acetaminophen (TYLENOL) 500 MG tablet Take 1,000 mg by mouth every 6 (six) hours as needed for moderate pain, fever or headache.   Yes Historical Provider, MD  albuterol (  PROAIR HFA) 108 (90 BASE) MCG/ACT inhaler Inhale 2 puffs into the lungs every 6 (six) hours as needed for wheezing or shortness of breath.   Yes Historical Provider, MD  albuterol (PROVENTIL) (2.5 MG/3ML) 0.083% nebulizer solution Inhale 3 mLs into the lungs every 6 (six) hours as needed for wheezing or shortness of breath.  02/27/15  Yes Historical Provider, MD  Cholecalciferol (VITAMIN D) 2000 UNITS tablet Take 2,000 Units by mouth daily with lunch.   Yes Historical Provider, MD  diltiazem (CARDIZEM CD) 120 MG 24 hr capsule Take 120 mg by mouth daily.  06/07/11  Yes Peter M Martinique, MD  diphenoxylate-atropine (LOMOTIL) 2.5-0.025 MG per tablet Take 1 tablet by mouth 4 (four) times daily as needed for diarrhea or loose stools. 08/08/14  Yes Heath Lark, MD  flecainide (TAMBOCOR) 100 MG tablet Take 50 mg by mouth 2 (two) times daily.   Yes Historical Provider, MD   Fluticasone-Salmeterol (ADVAIR) 250-50 MCG/DOSE AEPB Inhale 2 puffs into the lungs every 12 (twelve) hours.    Yes Historical Provider, MD  levothyroxine (SYNTHROID, LEVOTHROID) 75 MCG tablet Take 225 mcg by mouth daily before breakfast.   Yes Historical Provider, MD  lidocaine-prilocaine (EMLA) cream Apply 1 application topically as needed. Apply to [facility] a Cath site at least one hour prior to needle stick as needed. 01/04/14  Yes Heath Lark, MD  Magnesium Oxide 500 MG TABS Take 500 mg by mouth daily.    Yes Historical Provider, MD  metFORMIN (GLUCOPHAGE-XR) 500 MG 24 hr tablet Take 500 mg by mouth 2 (two) times daily with a meal.   Yes Historical Provider, MD  montelukast (SINGULAIR) 10 MG tablet Take 10 mg by mouth daily.   Yes Historical Provider, MD  ondansetron (ZOFRAN) 8 MG tablet Take 8 mg by mouth every 8 (eight) hours as needed for nausea or vomiting.   Yes Historical Provider, MD  pravastatin (PRAVACHOL) 40 MG tablet Take 20 mg by mouth See admin instructions. Take 1/2 tablet (20 mg) ONLY on Monday, Tuesday, Wednesday, and Thursday Nights.   Yes Historical Provider, MD  prochlorperazine (COMPAZINE) 10 MG tablet Take 1 tablet (10 mg total) by mouth every 6 (six) hours as needed for nausea (nausea). 11/13/14  Yes Heath Lark, MD  rivaroxaban (XARELTO) 20 MG TABS tablet Take 20 mg by mouth daily with supper.   Yes Historical Provider, MD  traMADol-acetaminophen (ULTRACET) 37.5-325 MG per tablet Take 1 tablet by mouth every 6 (six) hours as needed. Patient taking differently: Take 1 tablet by mouth every 6 (six) hours as needed for moderate pain.  02/01/14  Yes Venetia Maxon Rama, MD  vitamin B-12 (CYANOCOBALAMIN) 500 MCG tablet Take 500 mcg by mouth daily.   Yes Historical Provider, MD  enoxaparin (LOVENOX) 60 MG/0.6ML injection Inject 60 mg into the skin every 12 (twelve) hours. STOP XARELTO 2/28 then [place] lovenox for 2 days 04/18/15   Historical Provider, MD  [place]    Historical Provider, MD   BP 91/53 mmHg  Pulse 112  Temp(Src) 102.3 F (39.1 C) (Rectal)  Resp 22  Ht 5\' 6"  (1.676 m)  Wt 63.957 kg  BMI 22.77 kg/m2  SpO2 93% Physical Exam  Constitutional: She appears well-developed and well-nourished. No distress.  HENT:  Head: Normocephalic and atraumatic.  Right Ear: External ear normal.  Left Ear: External ear normal.  Nose: Nose normal.  Eyes: Conjunctivae and EOM are normal. Pupils are equal, round, and reactive to light. Right eye  exhibits no discharge. Left eye exhibits no discharge. No scleral icterus.  Neck: Normal range of motion. Neck supple.  Cardiovascular: Normal rate, regular rhythm and normal heart sounds.  Exam reveals no gallop and no friction rub.   No murmur heard. Pulmonary/Chest: Effort normal and breath sounds normal. No stridor. No respiratory distress. She has no wheezes.  Abdominal: Soft. She exhibits no distension. There is no tenderness.  Musculoskeletal: She exhibits no edema or tenderness.  Neurological: She is alert. She is disoriented.  Skin: Skin is dry. No rash noted. She is not diaphoretic. No erythema.  Hot to touch  Psychiatric: She has a normal mood and affect.    ED Course  Procedures (including critical care time) Labs Review Labs Reviewed  COMPREHENSIVE METABOLIC PANEL - Abnormal; Notable for the following:    Chloride 100 (*)    Glucose, Bld 133 (*)    AST 44 (*)    All other components within normal limits  CBC WITH DIFFERENTIAL/PLATELET - Abnormal; Notable for the following:    WBC 17.7 (*)    RBC 3.76 (*)    Hemoglobin 10.2 (*)    HCT 33.1 (*)    RDW 16.1 (*)    Neutro Abs 15.3 (*)    Monocytes Absolute 1.6 (*)    All other components within normal limits  URINALYSIS, ROUTINE W REFLEX MICROSCOPIC (NOT AT [facility]) - Abnormal; Notable for the following:    APPearance CLOUDY (*)    Nitrite POSITIVE (*)    Leukocytes, UA MODERATE (*)    All other components within normal limits   PROTIME-INR - Abnormal; Notable for the following:    Prothrombin Time 16.4 (*)    All other components within normal limits  URINE MICROSCOPIC-ADD ON - Abnormal; Notable for the following:    Squamous Epithelial / LPF 0-5 (*)    Bacteria, UA MANY (*)    Crystals CA OXALATE CRYSTALS (*)    All other components within normal limits  I-STAT CG4 LACTIC ACID, ED - Abnormal; Notable for the following:    Lactic Acid, Venous 2.67 (*)    All other components within normal limits  CULTURE, BLOOD (ROUTINE X 2)  CULTURE, BLOOD (ROUTINE X 2)  URINE CULTURE  CULTURE, BLOOD (SINGLE)    Imaging Review Dg Chest Port 1 View  04/26/2015  CLINICAL DATA:  Fever.  Generalized weakness. EXAM: PORTABLE CHEST 1 VIEW COMPARISON:  03/02/2015 FINDINGS: Right chest port remains in place. Previous patchy right lung base opacity has resolved. No new consolidation. Heart size and mediastinal contours are unchanged, borderline mild cardiomegaly. Vascular congestion versus vascular prominence secondary to technique. No large pleural effusion or pneumothorax. IMPRESSION: 1. Question of vascular congestion versus vascular prominence secondary to technique. 2. Previous right lung base opacity has resolved.  No new pneumonia. Electronically Signed   By: Jeb Levering M.D.   On: 04/26/2015 22:01   I have personally reviewed and evaluated these images and lab results as part of my medical decision-making.   EKG Interpretation   Date/Time:  Saturday April 26 2015 23:12:59 EST Ventricular Rate:  112 PR Interval:  199 QRS Duration: 168 QT Interval:  387 QTC Calculation: 528 R Axis:   -173 Text Interpretation:  Sinus tachycardia Left axis deviation T wave  inversion Nonspecific ST and T wave abnormality with 1st degree A-V block  NO SIGNIFICANT CHANGE SINCE LAST TRACING YESTERDAY Confirmed by Kathrynn Humble,  MD, Thelma Comp [phone]) on 04/26/2015 11:23:50 PM      MDM  Code sepsis. Patient given 30 mL/kg IV fluids and  started on emperic antibiotics. Likely secondary to urinary tract infection. Chest x-ray without evidence of pneumonia.  Mental status improved since arrival. Maps above 65 with fluids. Patient was admitted to stepdown unit for further management.  Diagnostic studies interpreted by me and use to my clinical decision-making.  Patient seen in conjunction with Dr. Kathrynn Humble.   Final diagnoses:  Sepsis due to urinary tract infection [facility])        Addison Lank, MD 04/27/15 0010  Varney Biles, MD 04/27/15 [id number]

## 2015-04-26 NOTE — ED Notes (Signed)
Dr. Leonette Monarch aware of pts BP and is at bedside.

## 2015-04-27 ENCOUNTER — Encounter (HOSPITAL_COMMUNITY): Payer: Self-pay | Admitting: *Deleted

## 2015-04-27 DIAGNOSIS — D801 Nonfamilial hypogammaglobulinemia: Secondary | ICD-10-CM

## 2015-04-27 LAB — INFLUENZA PANEL BY PCR (TYPE A & B)
H1N1 flu by pcr: NOT DETECTED
INFLAPCR: NEGATIVE
INFLBPCR: NEGATIVE

## 2015-04-27 LAB — LACTIC ACID, PLASMA
Lactic Acid, Venous: 1.7 mmol/L (ref 0.5–2.0)
Lactic Acid, Venous: 1.1 mmol/L (ref 0.5–2.0)

## 2015-04-27 LAB — CBC
HEMATOCRIT: 29.6 % — AB (ref 36.0–46.0)
Hemoglobin: 9.4 g/dL — ABNORMAL LOW (ref 12.0–15.0)
MCH: 27.9 pg (ref 26.0–34.0)
MCHC: 31.8 g/dL (ref 30.0–36.0)
MCV: 87.8 fL (ref 78.0–100.0)
PLATELETS: 142 10*3/uL — AB (ref 150–400)
RBC: 3.37 MIL/uL — AB (ref 3.87–5.11)
RDW: 16.7 % — ABNORMAL HIGH (ref 11.5–15.5)
WBC: 16.4 10*3/uL — ABNORMAL HIGH (ref 4.0–10.5)

## 2015-04-27 LAB — BASIC METABOLIC PANEL
Anion gap: 9 (ref 5–15)
BUN: 13 mg/dL (ref 6–20)
CHLORIDE: 107 mmol/L (ref 101–111)
CO2: 20 mmol/L — AB (ref 22–32)
CREATININE: 0.6 mg/dL (ref 0.44–1.00)
Calcium: 7.8 mg/dL — ABNORMAL LOW (ref 8.9–10.3)
GFR calc non Af Amer: >60 mL/min (ref >60)
Glucose, Bld: 139 mg/dL — ABNORMAL HIGH (ref 65–99)
POTASSIUM: 3 mmol/L — AB (ref 3.5–5.1)
SODIUM: 136 mmol/L (ref 135–145)

## 2015-04-27 LAB — I-STAT CG4 LACTIC ACID, ED: Lactic Acid, Venous: 1.65 mmol/L (ref 0.5–2.0)

## 2015-04-27 LAB — APTT: aPTT: 31 seconds (ref 24–37)

## 2015-04-27 LAB — PROTIME-INR
INR: 1.42 (ref 0.00–1.49)
PROTHROMBIN TIME: 17.4 s — AB (ref 11.6–15.2)

## 2015-04-27 LAB — PROCALCITONIN: Procalcitonin: 4.44 ng/mL

## 2015-04-27 LAB — GLUCOSE, CAPILLARY
GLUCOSE-CAPILLARY: 99 mg/dL (ref 65–99)
GLUCOSE-CAPILLARY: 98 mg/dL (ref 65–99)

## 2015-04-27 MED ORDER — ONDANSETRON HCL 4 MG PO TABS: 4.0000 mg | ORAL_TABLET | Freq: Four times a day (QID) | ORAL | Status: DC | PRN

## 2015-04-27 MED ORDER — POTASSIUM CHLORIDE CRYS ER 20 MEQ PO TBCR
40.0000 meq | EXTENDED_RELEASE_TABLET | Freq: Once | ORAL | Status: AC
  Administered 2015-04-27: 40 meq via ORAL
  Filled 2015-04-27: qty 2

## 2015-04-27 MED ORDER — PRAVASTATIN SODIUM 20 MG PO TABS
20.0000 mg | ORAL_TABLET | ORAL | Status: DC
  Administered 2015-04-28 – 2015-04-30 (×3): 20 mg via ORAL
  Filled 2015-04-27 (×3): qty 1

## 2015-04-27 MED ORDER — ALUM & MAG HYDROXIDE-SIMETH 200-200-20 MG/5ML PO SUSP: 30.0000 mL | Freq: Four times a day (QID) | ORAL | Status: DC | PRN

## 2015-04-27 MED ORDER — SODIUM CHLORIDE 0.9 % IV BOLUS (SEPSIS)
500.0000 mL | Freq: Once | INTRAVENOUS | Status: AC
  Administered 2015-04-27: 500 mL via INTRAVENOUS

## 2015-04-27 MED ORDER — INSULIN ASPART 100 UNIT/ML ~~LOC~~ SOLN
0.0000 [IU] | Freq: Three times a day (TID) | SUBCUTANEOUS | Status: DC
  Administered 2015-04-28: 2 [IU] via SUBCUTANEOUS
  Administered 2015-04-28: 1 [IU] via SUBCUTANEOUS
  Administered 2015-04-29: 2 [IU] via SUBCUTANEOUS
  Administered 2015-04-29 – 2015-04-30 (×3): 1 [IU] via SUBCUTANEOUS

## 2015-04-27 MED ORDER — PRAVASTATIN SODIUM 20 MG PO TABS: 20.0000 mg | ORAL_TABLET | ORAL | Status: DC

## 2015-04-27 MED ORDER — SODIUM CHLORIDE 0.9 % IV SOLN
INTRAVENOUS | Status: DC
  Administered 2015-04-27: 03:00:00 via INTRAVENOUS

## 2015-04-27 MED ORDER — FLECAINIDE ACETATE 50 MG PO TABS
50.0000 mg | ORAL_TABLET | Freq: Two times a day (BID) | ORAL | Status: DC
  Administered 2015-04-27 – 2015-04-30 (×8): 50 mg via ORAL
  Filled 2015-04-27 (×9): qty 1

## 2015-04-27 MED ORDER — ACETAMINOPHEN 325 MG PO TABS
650.0000 mg | ORAL_TABLET | Freq: Four times a day (QID) | ORAL | Status: DC | PRN
  Administered 2015-04-28: 650 mg via ORAL
  Filled 2015-04-27: qty 2

## 2015-04-27 MED ORDER — LEVOTHYROXINE SODIUM 75 MCG PO TABS
225.0000 ug | ORAL_TABLET | Freq: Every day | ORAL | Status: DC
  Administered 2015-04-27 – 2015-04-30 (×4): 225 ug via ORAL
  Filled 2015-04-27: qty 1
  Filled 2015-04-27 (×4): qty 3

## 2015-04-27 MED ORDER — MAGNESIUM OXIDE 400 (241.3 MG) MG PO TABS
400.0000 mg | ORAL_TABLET | Freq: Every day | ORAL | Status: DC
  Administered 2015-04-27: 400 mg via ORAL
  Filled 2015-04-27: qty 1

## 2015-04-27 MED ORDER — RIVAROXABAN 20 MG PO TABS
20.0000 mg | ORAL_TABLET | Freq: Every day | ORAL | Status: DC
  Administered 2015-04-27 – 2015-04-29 (×3): 20 mg via ORAL
  Filled 2015-04-27 (×2): qty 1

## 2015-04-27 MED ORDER — MAGNESIUM OXIDE -MG SUPPLEMENT 500 MG PO TABS: 500.0000 mg | ORAL_TABLET | Freq: Every day | ORAL | Status: DC

## 2015-04-27 MED ORDER — ALBUTEROL SULFATE (2.5 MG/3ML) 0.083% IN NEBU: 2.5000 mg | INHALATION_SOLUTION | RESPIRATORY_TRACT | Status: DC | PRN

## 2015-04-27 MED ORDER — MONTELUKAST SODIUM 10 MG PO TABS
10.0000 mg | ORAL_TABLET | Freq: Every day | ORAL | Status: DC
  Administered 2015-04-27 – 2015-04-29 (×3): 10 mg via ORAL
  Filled 2015-04-27 (×3): qty 1

## 2015-04-27 MED ORDER — METOCLOPRAMIDE HCL 10 MG PO TABS
10.0000 mg | ORAL_TABLET | Freq: Four times a day (QID) | ORAL | Status: DC | PRN
  Administered 2015-04-28 – 2015-04-29 (×4): 10 mg via ORAL
  Filled 2015-04-27 (×4): qty 1

## 2015-04-27 MED ORDER — CYANOCOBALAMIN 500 MCG PO TABS
500.0000 ug | ORAL_TABLET | Freq: Every day | ORAL | Status: DC
  Administered 2015-04-27 – 2015-04-30 (×4): 500 ug via ORAL
  Filled 2015-04-27 (×4): qty 1

## 2015-04-27 MED ORDER — ONDANSETRON HCL 4 MG/2ML IJ SOLN: 4.0000 mg | Freq: Four times a day (QID) | INTRAMUSCULAR | Status: DC | PRN

## 2015-04-27 MED ORDER — ACETAMINOPHEN 650 MG RE SUPP: 650.0000 mg | Freq: Four times a day (QID) | RECTAL | Status: DC | PRN

## 2015-04-27 NOTE — Progress Notes (Signed)
PATIENT DETAILS Name: Patty [name] Age: 75 y.o. Sex: female Date of Birth: September 16, 1940 Admit Date: 04/26/2015 Admitting Physician Evalee Mutton Kristeen Mans, MD LDJ:[id number] DAVIDSON, MD  Subjective: Awake, alert this morning. No cough, no abdominal pain, no diarrhea, no dysuria. Denies headache or neck pain.  Assessment/Plan: Principal Problem: Sepsis: Etiology remains uncertain, UA really not convincing for UTI. Chest x-ray negative as well. Await blood cultures. Continue empiric vancomycin and cefepime. Check influenza PCR. Sepsis pathophysiology is resolving-patient is clinically improved.   Active Problems: Chronic atrial fibrillation: Rate controlled, continue flecainide and Xarelto. Resume Cardizem and the next day or 2 when blood pressure stabilizes more.  Type 2 diabetes: CBGs stable, old metformin-start SSI and follow.  History of lymphoma/acquired hypogammaglobulinemia: On maintenance Rituxan for lymphoma, for hypogammaglobulinemia, gets periodic IVIG. Will notify oncology of the patient's admission tomorrow.  Recently diagnosed right breast cancer: Scheduled for mastectomy next week.  Hypothyroidism: Continue levothyroxine.  Dyslipidemia: Continue statin.  History of bronchial asthma: Lungs clear, as needed bronchodilators.  Disposition: Remain inpatient  Antimicrobial agents  See below  Anti-infectives    Start     Dose/Rate Route Frequency Ordered Stop   04/27/15 1000  vancomycin (VANCOCIN) 500 mg in sodium chloride 0.9 % 100 mL IVPB     500 mg 100 mL/hr over 60 Minutes Intravenous Every 12 hours 04/26/15 2158     04/27/15 0600  ceFEPIme (MAXIPIME) 2 g in dextrose 5 % 50 mL IVPB     2 g 100 mL/hr over 30 Minutes Intravenous 3 times per day 04/26/15 2158     04/26/15 2145  levofloxacin (LEVAQUIN) IVPB 750 mg  Status:  Discontinued     750 mg 100 mL/hr over 90 Minutes Intravenous  Once 04/26/15 2131 04/26/15 2139   04/26/15 2145  aztreonam  (AZACTAM) 2 g in dextrose 5 % 50 mL IVPB  Status:  Discontinued     2 g 100 mL/hr over 30 Minutes Intravenous  Once 04/26/15 2131 04/26/15 2131   04/26/15 2145  vancomycin (VANCOCIN) IVPB 1000 mg/200 mL premix     1,000 mg 200 mL/hr over 60 Minutes Intravenous  Once 04/26/15 2131 04/27/15 0036   04/26/15 2145  aztreonam (AZACTAM) 1 g in dextrose 5 % 50 mL IVPB  Status:  Discontinued     1 g 100 mL/hr over 30 Minutes Intravenous  Once 04/26/15 2131 04/26/15 2139   04/26/15 2145  ceFEPIme (MAXIPIME) 2 g in dextrose 5 % 50 mL IVPB     2 g 100 mL/hr over 30 Minutes Intravenous STAT 04/26/15 2142 04/26/15 2224      DVT Prophylaxis: Xarelto  Code Status: Full code  Family Communication None at bedside  Procedures: None  CONSULTS:  None  Time spent 30 minutes-Greater than 50% of this time was spent in counseling, explanation of diagnosis, planning of further management, and coordination of care.  MEDICATIONS: Scheduled Meds: . ceFEPime (MAXIPIME) IV  2 g Intravenous 3 times per day  . cyanocobalamin  500 mcg Oral Daily  . flecainide  50 mg Oral BID  . levothyroxine  225 mcg Oral QAC breakfast  . magnesium oxide  400 mg Oral Daily  . montelukast  10 mg Oral QHS  . [START ON 04/28/2015] pravastatin  20 mg Oral Once per day on Mon Tue Wed Thu  . rivaroxaban  20 mg Oral Q supper  . vancomycin  500 mg Intravenous Q12H  Continuous Infusions: . sodium chloride 75 mL/hr at 04/27/15 0234   PRN Meds:.acetaminophen **OR** acetaminophen, alum & mag hydroxide-simeth, metoCLOPramide    PHYSICAL EXAM: Vital signs in last 24 hours: Filed Vitals:   04/27/15 0745 04/27/15 0900 04/27/15 0915 04/27/15 1051  BP: 104/77 119/75  113/64  Pulse:  110 110 103  Temp:    98 F (36.7 C)  TempSrc:    Oral  Resp: '11  15 20  ' Height:    '5\' 6"'  (1.676 m)  Weight:    65.9 kg (145 lb 4.5 oz)  SpO2: 97% 97% 96% 95%    Weight change:  Filed Weights   04/26/15 2121 04/27/15 1051  Weight:  63.957 kg (141 lb) 65.9 kg (145 lb 4.5 oz)   Body mass index is 23.46 kg/(m^2).   Gen Exam: Awake and alert with clear speech.   Neck: Supple, No JVD.   Chest: B/L Clear.   CVS: S1 S2 Regular, no murmurs.  Abdomen: soft, BS +, non tender, non distended.  Extremities: no edema, lower extremities warm to touch. Neurologic: Non Focal.   Skin: No Rash.   Wounds: N/A.   Intake/Output from previous day:  Intake/Output Summary (Last 24 hours) at 04/27/15 1141 Last data filed at 04/27/15 0743  Gross per 24 hour  Intake   3850 ml  Output    350 ml  Net   3500 ml     LAB RESULTS: CBC  Recent Labs Lab 04/24/15 0818 04/26/15 2141 04/27/15 0506  WBC 5.7 17.7* 16.4*  HGB 10.3* 10.2* 9.4*  HCT 32.0* 33.1* 29.6*  PLT 201 168 142*  MCV 85.7 88.0 87.8  MCH 27.5 27.1 27.9  MCHC 32.1 30.8 31.8  RDW 17.4* 16.1* 16.7*  LYMPHSABS 1.1 0.8  --   MONOABS 0.5 1.6*  --   EOSABS 0.1 0.0  --   BASOSABS 0.0 0.0  --     Chemistries   Recent Labs Lab 04/24/15 0818 04/26/15 2141 04/27/15 0506  NA 139 135 136  K 3.8 3.6 3.0*  CL  --  100* 107  CO2 24 22 20*  GLUCOSE 123 133* 139*  BUN 15.'1 16 13  ' CREATININE 0.8 0.82 0.60  CALCIUM 9.5 9.2 7.8*    CBG:  Recent Labs Lab 04/27/15 1124  GLUCAP 99    GFR Estimated Creatinine Clearance: 56 mL/min (by C-G formula based on Cr of 0.6).  Coagulation profile  Recent Labs Lab 04/25/15 1330 04/26/15 2141 04/27/15 0110  INR 1.39 1.31 1.42    Cardiac Enzymes No results for input(s): CKMB, TROPONINI, MYOGLOBIN in the last 168 hours.  Invalid input(s): CK  Invalid input(s): POCBNP No results for input(s): DDIMER in the last 72 hours.  Recent Labs  04/25/15 1430  HGBA1C 5.5   No results for input(s): CHOL, HDL, LDLCALC, TRIG, CHOLHDL, LDLDIRECT in the last 72 hours. No results for input(s): TSH, T4TOTAL, T3FREE, THYROIDAB in the last 72 hours.  Invalid input(s): FREET3 No results for input(s): VITAMINB12, FOLATE,  FERRITIN, TIBC, IRON, RETICCTPCT in the last 72 hours. No results for input(s): LIPASE, AMYLASE in the last 72 hours.  Urine Studies No results for input(s): UHGB, CRYS in the last 72 hours.  Invalid input(s): UACOL, UAPR, USPG, UPH, UTP, UGL, UKET, UBIL, UNIT, UROB, ULEU, UEPI, UWBC, URBC, UBAC, CAST, UCOM, BILUA  MICROBIOLOGY: No results found for this or any previous visit (from the past 240 hour(s)).  RADIOLOGY STUDIES/RESULTS: Dg Chest Port 1 View  04/26/2015  CLINICAL  DATA:  Fever.  Generalized weakness. EXAM: PORTABLE CHEST 1 VIEW COMPARISON:  03/02/2015 FINDINGS: Right chest port remains in place. Previous patchy right lung base opacity has resolved. No new consolidation. Heart size and mediastinal contours are unchanged, borderline mild cardiomegaly. Vascular congestion versus vascular prominence secondary to technique. No large pleural effusion or pneumothorax. IMPRESSION: 1. Question of vascular congestion versus vascular prominence secondary to technique. 2. Previous right lung base opacity has resolved.  No new pneumonia. Electronically Signed   By: Jeb Levering M.D.   On: 04/26/2015 22:01   Mm Digital Diagnostic Unilat R  04/10/2015  CLINICAL DATA:  Status post ultrasound-guided biopsies of 2 right breast masses today. EXAM: DIAGNOSTIC RIGHT MAMMOGRAM POST ULTRASOUND BIOPSY COMPARISON:  Previous exam(s). FINDINGS: Mammographic images were obtained following ultrasound guided biopsy of 2 right breast masses. The smaller of the 2 masses located at the 2:30 o'clock axis, 7 cm from the nipple. The largest mass located at the 2 o'clock axis, 5 cm from the nipple. At the conclusion of the first biopsy at the 2:30 o'clock axis, a ribbon shaped tissue marker was placed at the biopsy site. At the conclusion of the second biopsy at the 2 o'clock axis, a coil shaped tissue marker was placed at the biopsy site. Both clips appear well positioned at the respective biopsy sites. The coil shaped  clip corresponds to the site of the initial mammographically detected mass. IMPRESSION: Postprocedure mammogram for clip placement. Both biopsy clips appear well positioned. Final Assessment: Post Procedure Mammograms for Marker Placement Electronically Signed   By: Franki Cabot M.D.   On: 04/10/2015 16:29   US Breast Ltd Uni Right Inc Axilla  04/11/2015  ADDENDUM REPORT: 04/11/2015 14:20 ADDENDUM: Correction of laterality: In the body of the report, all masses described were in the right breast. Specifically, the mass at 2 o'clock, 5 cm from the nipple described in the sonographic findings measuring 1.3 x 0.5 x 0.5 cm is in the RIGHT breast. Electronically Signed   By: Ammie Ferrier M.D.   On: 04/11/2015 14:20  04/11/2015  CLINICAL DATA:  Screening recall for a possible right breast mass. EXAM: DIGITAL DIAGNOSTIC RIGHT MAMMOGRAM WITH 3D TOMOSYNTHESIS AND CAD RIGHT BREAST ULTRASOUND COMPARISON:  Previous exam(s). ACR Breast Density Category b: There are scattered areas of fibroglandular density. FINDINGS: In the medial slightly upper right breast, posterior depth there is an oval mass measuring approximately 1 cm. The margins appear indistinct. On the MLO image, there is also a possible smaller asymmetry at approximately 3 o'clock measuring 0.5 cm. Mammographic images were processed with CAD. Physical exam of the medial right breast demonstrates Ultrasound targeted to the medial right breast at 3 o'clock, 7 cm from the note demonstrates a small irregular hypoechoic mass measuring 0.4 x 0.2 x 0.6 cm. There is a second larger mass in the upper inner left breast at 2 o'clock, 5 cm from the nipple measuring 1.3 x 0.5 x 0.5 cm. These 2 masses lie approximately 3.3 cm apart, and the tissue including both masses spans at least 4.3 cm. No internal vascularity can be documented in either mass with color Doppler examination. Ultrasound of the right axilla demonstrates normal-appearing lymph nodes within cortices.  IMPRESSION: 1. There are 2 suspicious masses in the right breast at 2 and 3 o'clock respectively. RECOMMENDATION: Ultrasound-guided biopsy is recommended for the 2 suspicious masses in the right breast. This has been scheduled for 04/10/2015 at 3 p.m. I have discussed the findings and recommendations with the  patient. Results were also provided in writing at the conclusion of the visit. If applicable, a reminder letter will be sent to the patient regarding the next appointment. BI-RADS CATEGORY  4: Suspicious. Electronically Signed: By: Ammie Ferrier M.D. On: 04/04/2015 10:24   Mm Diag Breast Tomo Uni Right  04/11/2015  ADDENDUM REPORT: 04/11/2015 14:20 ADDENDUM: Correction of laterality: In the body of the report, all masses described were in the right breast. Specifically, the mass at 2 o'clock, 5 cm from the nipple described in the sonographic findings measuring 1.3 x 0.5 x 0.5 cm is in the RIGHT breast. Electronically Signed   By: Ammie Ferrier M.D.   On: 04/11/2015 14:20  04/11/2015  CLINICAL DATA:  Screening recall for a possible right breast mass. EXAM: DIGITAL DIAGNOSTIC RIGHT MAMMOGRAM WITH 3D TOMOSYNTHESIS AND CAD RIGHT BREAST ULTRASOUND COMPARISON:  Previous exam(s). ACR Breast Density Category b: There are scattered areas of fibroglandular density. FINDINGS: In the medial slightly upper right breast, posterior depth there is an oval mass measuring approximately 1 cm. The margins appear indistinct. On the MLO image, there is also a possible smaller asymmetry at approximately 3 o'clock measuring 0.5 cm. Mammographic images were processed with CAD. Physical exam of the medial right breast demonstrates Ultrasound targeted to the medial right breast at 3 o'clock, 7 cm from the note demonstrates a small irregular hypoechoic mass measuring 0.4 x 0.2 x 0.6 cm. There is a second larger mass in the upper inner left breast at 2 o'clock, 5 cm from the nipple measuring 1.3 x 0.5 x 0.5 cm. These 2 masses  lie approximately 3.3 cm apart, and the tissue including both masses spans at least 4.3 cm. No internal vascularity can be documented in either mass with color Doppler examination. Ultrasound of the right axilla demonstrates normal-appearing lymph nodes within cortices. IMPRESSION: 1. There are 2 suspicious masses in the right breast at 2 and 3 o'clock respectively. RECOMMENDATION: Ultrasound-guided biopsy is recommended for the 2 suspicious masses in the right breast. This has been scheduled for 04/10/2015 at 3 p.m. I have discussed the findings and recommendations with the patient. Results were also provided in writing at the conclusion of the visit. If applicable, a reminder letter will be sent to the patient regarding the next appointment. BI-RADS CATEGORY  4: Suspicious. Electronically Signed: By: Ammie Ferrier M.D. On: 04/04/2015 10:24   Mm Screening Breast Tomo Bilateral  03/31/2015  CLINICAL DATA:  Screening. EXAM: DIGITAL SCREENING BILATERAL MAMMOGRAM WITH 3D TOMO WITH CAD COMPARISON:  Previous exam(s). ACR Breast Density Category b: There are scattered areas of fibroglandular density. FINDINGS: In the right breast, a possible mass with calcifications warrants further evaluation. In the left breast, no findings suspicious for malignancy. Images were processed with CAD. IMPRESSION: Further evaluation is suggested for possible mass with calcifications in the right breast. RECOMMENDATION: Diagnostic mammogram and possibly ultrasound of the right breast. (Code:[id number]) The patient will be contacted regarding the findings, and additional imaging will be scheduled. BI-RADS CATEGORY  0: Incomplete. Need additional imaging evaluation and/or prior mammograms for comparison. Electronically Signed   By: Altamese Cabal M.D.   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 Grade II INVASIVE DUCTAL CARCINOMA, DUCTAL CARCINOMA IN SITU WITH CALCIFICATIONS of the Right  breast at the 2:00 o'clock location Occupational psychologist). This was found to be concordant by Dr. Franki Cabot. Pathology results were discussed with the patient by telephone. The patient reported doing well after the biopsies. Post biopsy instructions and care were reviewed and questions were answered. The patient was encouraged to call [facility] for any additional concerns. Surgical consultation has been arranged with Dr. Fanny Skates at [facility] on April 15, 2015. Pathology results reported by Terie Purser, RN on 04/11/2015. Electronically Signed   By: Franki Cabot M.D.   On: 04/11/2015 10:46  04/11/2015  CLINICAL DATA:  Patient with 2 right breast masses presents today for ultrasound-guided biopsies. EXAM: ULTRASOUND GUIDED RIGHT BREAST CORE NEEDLE BIOPSY COMPARISON:  Previous exam(s). PROCEDURE: I met with the patient and we discussed the procedure of ultrasound-guided biopsy, including benefits and alternatives. We discussed the high likelihood of a successful procedure. We discussed the risks of the procedure including infection, bleeding, tissue injury, clip migration, and inadequate sampling. Informed written consent was given. The usual time-out protocol was performed immediately prior to the procedure. Preprocedure ultrasound was performed revealing 2 masses which were described on recent diagnostic imaging. Today, the smaller mass is localized to the 2:30 o'clock axis, 7 cm from the nipple. Today, the larger mass is localized to 2 o'clock axis, 5 cm from nipple. Using sterile technique and 1% Lidocaine as local anesthetic, under direct ultrasound visualization, a 12 gauge spring-loaded device was used to perform biopsy of the smaller mass at the 2:30 o'clock axisusing an  inferior approach. At the conclusion of the procedure, a ribbon shaped tissue marker clip was deployed into the biopsy cavity. Next, using sterile technique and 1% lidocaine as local anesthetic, under direct ultrasound visualization, a 12 gauge spring-loaded device was used to perform biopsy of the larger mass at the 2 o'clock axis using an inferior approach. At the conclusion of the procedure, a clip shaped coil was placed at the biopsy site. Follow-up 2-view mammogram was performed and dictated separately. IMPRESSION: Ultrasound-guided biopsy of the 2 masses within the right breast at the 2:30 o'clock axis (smaller mass) and 2 o'clock axis (larger mass) respectively. No apparent complications. Electronically Signed: By: Franki Cabot M.D. 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 This was found to be concordant by Dr. Franki Cabot. Pathology results were discussed with the patient by telephone. The patient reported doing well after the biopsies. Post biopsy instructions and care were reviewed and questions were answered. The patient was encouraged to call [facility] for any additional concerns. Surgical consultation has been arranged with Dr. Fanny Skates at [facility] on April 15, 2015. Pathology results reported by Terie Purser, RN on 04/11/2015. Electronically Signed   By: Franki Cabot M.D.   On: 04/11/2015 10:46  04/11/2015  CLINICAL DATA:  Patient with 2 right breast masses presents today for ultrasound-guided biopsies. EXAM: ULTRASOUND GUIDED RIGHT BREAST CORE NEEDLE BIOPSY COMPARISON:   Previous exam(s). PROCEDURE: I met with the patient and we discussed the procedure of ultrasound-guided biopsy, including benefits and alternatives. We discussed the  high likelihood of a successful procedure. We discussed the risks of the procedure including infection, bleeding, tissue injury, clip migration, and inadequate sampling. Informed written consent was given. The usual time-out protocol was performed immediately prior to the procedure. Preprocedure ultrasound was performed revealing 2 masses which were described on recent diagnostic imaging. Today, the smaller mass is localized to the 2:30 o'clock axis, 7 cm from the nipple. Today, the larger mass is localized to 2 o'clock axis, 5 cm from nipple. Using sterile technique and 1% Lidocaine as local anesthetic, under direct ultrasound visualization, a 12 gauge spring-loaded device was used to perform biopsy of the smaller mass at the 2:30 o'clock axisusing an inferior approach. At the conclusion of the procedure, a ribbon shaped tissue marker clip was deployed into the biopsy cavity. Next, using sterile technique and 1% lidocaine as local anesthetic, under direct ultrasound visualization, a 12 gauge spring-loaded device was used to perform biopsy of the larger mass at the 2 o'clock axis using an inferior approach. At the conclusion of the procedure, a clip shaped coil was placed at the biopsy site. Follow-up 2-view mammogram was performed and dictated separately. IMPRESSION: Ultrasound-guided biopsy of the 2 masses within the right breast at the 2:30 o'clock axis (smaller mass) and 2 o'clock axis (larger mass) respectively. No apparent complications. Electronically Signed: By: Franki Cabot M.D. On: 04/10/2015 16:18    Oren Binet, MD  [facility] Pager:[phone]  If 7PM-7AM, please contact night-coverage [email or web address] Password [email or web address] 04/27/2015, 11:41 AM   LOS: 1 day

## 2015-04-27 NOTE — Progress Notes (Signed)
Utilization review completed.  

## 2015-04-28 LAB — CBC WITH DIFFERENTIAL/PLATELET
BASOS ABS: 0 10*3/uL (ref 0.0–0.1)
BASOS PCT: 0 %
EOS ABS: 0 10*3/uL (ref 0.0–0.7)
EOS PCT: 0 %
HCT: 31.1 % — ABNORMAL LOW (ref 36.0–46.0)
HEMOGLOBIN: 9.8 g/dL — AB (ref 12.0–15.0)
LYMPHS ABS: 0.6 10*3/uL — AB (ref 0.7–4.0)
Lymphocytes Relative: 6 %
MCH: 27.4 pg (ref 26.0–34.0)
MCHC: 31.5 g/dL (ref 30.0–36.0)
MCV: 86.9 fL (ref 78.0–100.0)
Monocytes Absolute: 0.7 10*3/uL (ref 0.1–1.0)
Monocytes Relative: 6 %
NEUTROS PCT: 88 %
Neutro Abs: 9.6 10*3/uL — ABNORMAL HIGH (ref 1.7–7.7)
PLATELETS: 130 10*3/uL — AB (ref 150–400)
RBC: 3.58 MIL/uL — AB (ref 3.87–5.11)
RDW: 16.1 % — ABNORMAL HIGH (ref 11.5–15.5)
WBC: 10.9 10*3/uL — AB (ref 4.0–10.5)

## 2015-04-28 LAB — GLUCOSE, CAPILLARY
GLUCOSE-CAPILLARY: 115 mg/dL — AB (ref 65–99)
GLUCOSE-CAPILLARY: 134 mg/dL — AB (ref 65–99)
Glucose-Capillary: 137 mg/dL — ABNORMAL HIGH (ref 65–99)
Glucose-Capillary: 158 mg/dL — ABNORMAL HIGH (ref 65–99)

## 2015-04-28 LAB — BASIC METABOLIC PANEL
Anion gap: 8 (ref 5–15)
BUN: 10 mg/dL (ref 6–20)
CO2: 18 mmol/L — ABNORMAL LOW (ref 22–32)
Calcium: 8.2 mg/dL — ABNORMAL LOW (ref 8.9–10.3)
Chloride: 106 mmol/L (ref 101–111)
Creatinine, Ser: 0.59 mg/dL (ref 0.44–1.00)
Glucose, Bld: 138 mg/dL — ABNORMAL HIGH (ref 65–99)
POTASSIUM: 3 mmol/L — AB (ref 3.5–5.1)
SODIUM: 132 mmol/L — AB (ref 135–145)

## 2015-04-28 LAB — MAGNESIUM: MAGNESIUM: 1.5 mg/dL — AB (ref 1.7–2.4)

## 2015-04-28 MED ORDER — DILTIAZEM HCL ER COATED BEADS 120 MG PO CP24
120.0000 mg | ORAL_CAPSULE | Freq: Every day | ORAL | Status: DC
Start: 2015-04-28 — End: 2015-04-30
  Administered 2015-04-28 – 2015-04-30 (×3): 120 mg via ORAL
  Filled 2015-04-28 (×3): qty 1

## 2015-04-28 MED ORDER — MAGNESIUM SULFATE 2 GM/50ML IV SOLN
2.0000 g | Freq: Once | INTRAVENOUS | Status: AC
  Administered 2015-04-28: 2 g via INTRAVENOUS
  Filled 2015-04-28: qty 50

## 2015-04-28 MED ORDER — POTASSIUM CHLORIDE CRYS ER 20 MEQ PO TBCR
40.0000 meq | EXTENDED_RELEASE_TABLET | Freq: Once | ORAL | Status: AC
  Administered 2015-04-28: 40 meq via ORAL
  Filled 2015-04-28: qty 2

## 2015-04-28 MED ORDER — FUROSEMIDE 10 MG/ML IJ SOLN
40.0000 mg | Freq: Once | INTRAMUSCULAR | Status: AC
  Administered 2015-04-28: 40 mg via INTRAVENOUS
  Filled 2015-04-28: qty 4

## 2015-04-28 NOTE — Clinical Documentation Improvement (Signed)
Internal Medicine  Can the diagnosis of CKD be further specified? Documentation found in ED and H&P. Please document findings in next progress note NOT in BPA drop down box.   CKD Stage I - GFR greater than or equal to 90  CKD Stage II - GFR 60-89  CKD Stage III - GFR 30-59  CKD Stage IV - GFR 15-29  CKD Stage V - GFR < 15  ESRD (End Stage Renal Disease)  Other condition  Unable to clinically determine  Supporting Information: : (risk factors, signs and symptoms, diagnostics, treatment)  White female  GFR's running > 60 for current admission.  Please exercise your independent, professional judgment when responding. A specific answer is not anticipated or expected.   Thank You, [place] [phone]

## 2015-04-28 NOTE — Evaluation (Signed)
Physical Therapy Evaluation Patient Details Name: Patty [name] MRN: [id number] DOB: 11-Sep-1940 Today's Date: 04/28/2015   History of Present Illness  Patty [name] is a 75 y.o. female with a history of NHL, Acquired Hypogammaglobinemia, Chronic Atrial fibrillation, DM2, Hypothyroid who presents to the ED with complaints of Fever and chills and confusion today. Her family reports that she usually gets a UTI a few days after her Chemo Rx. She found to have a temperature of 102.3 along with hypotension and tachycardia, and a Sepsis Workup was initiated she was placed on IV Vancomycin and Cefepime and was referred for admission. 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  Clinical Impression  Pt admitted with above diagnosis. Pt currently with functional limitations due to the deficits listed below (see PT Problem List). Pt was able to ambulate with overall poor safety awareness needing cues and assist.  Will continue to progress pt as she tolerates.  States that husband and son in law can care for her at home.  Can use husbands RW and will need HHPT and HHOT as well as possibly HHAide.   Pt will benefit from skilled PT to increase their independence and safety with mobility to allow discharge to the venue listed below.    Follow Up Recommendations Home health PT;Supervision/Assistance - 24 hour; HHOT and HHAide    Equipment Recommendations  None recommended by PT    Recommendations for Other Services OT consult     Precautions / Restrictions Precautions Precautions: Fall Restrictions Weight Bearing Restrictions: No      Mobility  Bed Mobility Overal bed mobility: Independent             General bed mobility comments: incr time to get to EOB.  Transfers Overall transfer level: Needs assistance Equipment used: Rolling walker (2 wheeled) Transfers: Sit to/from [facility] Sit to Stand: Min assist Stand pivot transfers: Min assist  Pt initially  transferred to 3N1 and was able to clean herself in sitting.         General transfer comment: Pt needed bil UE support of RW for standing safely.  Iimpulsive with movements needing assiwst and cues for safety.   Ambulation/Gait Ambulation/Gait assistance: Min assist;Min guard Ambulation Distance (Feet): 110 Feet Assistive device: Rolling walker (2 wheeled) Gait Pattern/deviations: Step-through pattern;Decreased stride length;Trunk flexed;Wide base of support;Leaning posteriorly   Gait velocity interpretation: Below normal speed for age/gender General Gait Details: Pt needed cues to stay close to RW, to slow down and for sequencing RW and steps.  Pt was unsteady at times.  Uncontrolled descent into chair with pt not reaching back even though cues given as pt very fatigued with incr impulsivitiy given fatigue.    Stairs            Wheelchair Mobility    Modified Rankin (Stroke Patients Only)       Balance Overall balance assessment: Needs assistance;History of Falls Sitting-balance support: No upper extremity supported;Feet supported Sitting balance-Leahy Scale: Fair     Standing balance support: Bilateral upper extremity supported;During functional activity Standing balance-Leahy Scale: Poor Standing balance comment: relies on RW for support.                              Pertinent Vitals/Pain Pain Assessment: No/denies pain  VSS    Home Living Family/patient expects to be discharged to:: Private residence Living Arrangements: Spouse/significant other;Other relatives (son in law can help when husband  cant per pt) Available Help at Discharge: Family;Available 24 hours/day Type of Home: House Home Access: Stairs to enter Entrance Stairs-Rails: Right;Left;Can reach both Entrance Stairs-Number of Steps: 2 Home Layout: One level Home Equipment: Clinical cytogeneticist - 2 wheels;Grab bars - tub/shower Additional Comments: husband works part time at [employer]  day but can take off.     Prior Function Level of Independence: Independent               Hand Dominance   Dominant Hand: Right    Extremity/Trunk Assessment   Upper Extremity Assessment: Defer to OT evaluation           Lower Extremity Assessment: Generalized weakness      Cervical / Trunk Assessment: Kyphotic  Communication   Communication: No difficulties  Cognition Arousal/Alertness: Awake/alert Behavior During Therapy: WFL for tasks assessed/performed Overall Cognitive Status: Within Functional Limits for tasks assessed                      General Comments      Exercises General Exercises - Lower Extremity Ankle Circles/Pumps: AROM;5 reps;Supine;Both Long Arc Quad: AROM;Both;5 reps;Seated      Assessment/Plan    PT Assessment Patient needs continued PT services  PT Diagnosis Generalized weakness   PT Problem List Decreased activity tolerance;Decreased balance;Decreased mobility;Decreased knowledge of use of DME;Decreased safety awareness;Decreased knowledge of precautions;Cardiopulmonary status limiting activity;Decreased strength  PT Treatment Interventions DME instruction;Gait training;Functional mobility training;Therapeutic activities;Therapeutic exercise;Balance training;Patient/family education   PT Goals (Current goals can be found in the Care Plan section) Acute Rehab PT Goals Patient Stated Goal: to go home PT Goal Formulation: With patient Time For Goal Achievement: 05/12/15 Potential to Achieve Goals: Good    Frequency Min 3X/week   Barriers to discharge        Co-evaluation               End of Session Equipment Utilized During Treatment: Gait belt Activity Tolerance: Patient limited by fatigue Patient left: in chair;with call bell/phone within reach;with chair alarm set Nurse Communication: Mobility status         Time: [id number] PT Time Calculation (min) (ACUTE ONLY): 17 min   Charges:   PT  Evaluation $PT Eval Moderate Complexity: 1 Procedure     PT G CodesIrwin Brakeman F May 26, 2015, 4:43 PM Sahid Borba [facility] Acute Rehabilitation [phone] (pager)

## 2015-04-28 NOTE — Progress Notes (Signed)
Spoke with Wendy/CCS with Dr Dalbert Batman in regards to pts current admission in hospital. Pt schedule to have surgery on 05/02/2015. Abigail Butts stated would notify Dr Dalbert Batman of situation.

## 2015-04-28 NOTE — Progress Notes (Addendum)
PATIENT DETAILS Name: Patty [name] Age: 75 y.o. Sex: female Date of Birth: 1940/08/20 Admit Date: 04/26/2015 Admitting Physician Evalee Mutton Kristeen Mans, MD TDV:[id number],VPXTGG DAVIDSON, MD  Subjective: Awake, alert this morning. Claims she is slightly short of breath-but no other complaints.  Assessment/Plan: Principal Problem: Sepsis: Etiology remains uncertain, UA not very convincing for UTI-however urine cultures positive for gram-negative rods. Chest x-ray negative as well. Blood cultures negative so far. We will continue empiric cefepime, discontinue vancomycin and follow. Sepsis pathophysiology has resolved.   Active Problems: Hypokalemia: Replete and recheck.  Chronic atrial fibrillation: Rate controlled, continue flecainide and Xarelto. Resume Cardizem today. Blood pressure more stable today.   Type 2 diabetes: CBGs stable, hold metformin-start SSI and follow.  History of lymphoma/acquired hypogammaglobulinemia: On maintenance Rituxan for lymphoma, for hypogammaglobulinemia, gets periodic IVIG. Will notify oncology of the patient's admission tomorrow.  Recently diagnosed right breast cancer: Scheduled for mastectomy next week.  Hypothyroidism: Continue levothyroxine.  Dyslipidemia: Continue statin.  History of bronchial asthma: Lungs clear, as needed bronchodilators.  Disposition: Remain inpatient-home in a few days.  Antimicrobial agents  See below  Anti-infectives    Start     Dose/Rate Route Frequency Ordered Stop   04/27/15 1000  vancomycin (VANCOCIN) 500 mg in sodium chloride 0.9 % 100 mL IVPB  Status:  Discontinued     500 mg 100 mL/hr over 60 Minutes Intravenous Every 12 hours 04/26/15 2158 04/28/15 0844   04/27/15 0600  ceFEPIme (MAXIPIME) 2 g in dextrose 5 % 50 mL IVPB     2 g 100 mL/hr over 30 Minutes Intravenous 3 times per day 04/26/15 2158     04/26/15 2145  levofloxacin (LEVAQUIN) IVPB 750 mg  Status:  Discontinued     750 mg 100  mL/hr over 90 Minutes Intravenous  Once 04/26/15 2131 04/26/15 2139   04/26/15 2145  aztreonam (AZACTAM) 2 g in dextrose 5 % 50 mL IVPB  Status:  Discontinued     2 g 100 mL/hr over 30 Minutes Intravenous  Once 04/26/15 2131 04/26/15 2131   04/26/15 2145  vancomycin (VANCOCIN) IVPB 1000 mg/200 mL premix     1,000 mg 200 mL/hr over 60 Minutes Intravenous  Once 04/26/15 2131 04/27/15 0036   04/26/15 2145  aztreonam (AZACTAM) 1 g in dextrose 5 % 50 mL IVPB  Status:  Discontinued     1 g 100 mL/hr over 30 Minutes Intravenous  Once 04/26/15 2131 04/26/15 2139   04/26/15 2145  ceFEPIme (MAXIPIME) 2 g in dextrose 5 % 50 mL IVPB     2 g 100 mL/hr over 30 Minutes Intravenous STAT 04/26/15 2142 04/26/15 2224      DVT Prophylaxis: Xarelto  Code Status: Full code  Family Communication None at bedside  Procedures: None  CONSULTS:  None  Time spent 25 minutes-Greater than 50% of this time was spent in counseling, explanation of diagnosis, planning of further management, and coordination of care.  MEDICATIONS: Scheduled Meds: . ceFEPime (MAXIPIME) IV  2 g Intravenous 3 times per day  . cyanocobalamin  500 mcg Oral Daily  . diltiazem  120 mg Oral Daily  . flecainide  50 mg Oral BID  . insulin aspart  0-9 Units Subcutaneous TID WC  . levothyroxine  225 mcg Oral QAC breakfast  . montelukast  10 mg Oral QHS  . potassium chloride  40 mEq Oral Once  . pravastatin  20 mg  Oral Once per day on Mon Tue Wed Thu  . rivaroxaban  20 mg Oral Q supper   Continuous Infusions:   PRN Meds:.acetaminophen **OR** acetaminophen, albuterol, alum & mag hydroxide-simeth, metoCLOPramide    PHYSICAL EXAM: Vital signs in last 24 hours: Filed Vitals:   04/28/15 0731 04/28/15 1000 04/28/15 1029 04/28/15 1134  BP: 109/64 127/73  115/72  Pulse: 113 115  115  Temp: 98.5 F (36.9 C) 97.9 F (36.6 C)  98.7 F (37.1 C)  TempSrc: Oral Oral  Oral  Resp:  20  18  Height:      Weight: 66.225 kg (146 lb)      SpO2: 95% 85% 93% 96%    Weight change: 1.943 kg (4 lb 4.5 oz) Filed Weights   04/26/15 2121 04/27/15 1051 04/28/15 0731  Weight: 63.957 kg (141 lb) 65.9 kg (145 lb 4.5 oz) 66.225 kg (146 lb)   Body mass index is 23.58 kg/(m^2).   Gen Exam: Awake and alert with clear speech.   Neck: Supple, No JVD.   Chest: Few bibasilar rales. CVS: S1 S2 Regular, no murmurs.  Abdomen: soft, BS +, non tender, non distended.  Extremities: no edema, lower extremities warm to touch. Neurologic: Non Focal.   Skin: No Rash.   Wounds: N/A.   Intake/Output from previous day:  Intake/Output Summary (Last 24 hours) at 04/28/15 1154 Last data filed at 04/28/15 1133  Gross per 24 hour  Intake   3380 ml  Output   2900 ml  Net    480 ml     LAB RESULTS: CBC  Recent Labs Lab 04/24/15 0818 04/26/15 2141 04/27/15 0506 04/28/15 0326  WBC 5.7 17.7* 16.4* 10.9*  HGB 10.3* 10.2* 9.4* 9.8*  HCT 32.0* 33.1* 29.6* 31.1*  PLT 201 168 142* 130*  MCV 85.7 88.0 87.8 86.9  MCH 27.5 27.1 27.9 27.4  MCHC 32.1 30.8 31.8 31.5  RDW 17.4* 16.1* 16.7* 16.1*  LYMPHSABS 1.1 0.8  --  0.6*  MONOABS 0.5 1.6*  --  0.7  EOSABS 0.1 0.0  --  0.0  BASOSABS 0.0 0.0  --  0.0    Chemistries   Recent Labs Lab 04/24/15 0818 04/26/15 2141 04/27/15 0506 04/28/15 0326  NA 139 135 136 132*  K 3.8 3.6 3.0* 3.0*  CL  --  100* 107 106  CO2 24 22 20* 18*  GLUCOSE 123 133* 139* 138*  BUN 15._0 CREATININE 0.8 0.82 0.60 0.59  CALCIUM 9.5 9.2 7.8* 8.2*  MG  --   --   --  1.5*    CBG:  Recent Labs Lab 04/27/15 1124 04/27/15 1627 04/27/15 2330 04/28/15 0746 04/28/15 1129  GLUCAP 99 98 115* 137* 158*    GFR Estimated Creatinine Clearance: 56 mL/min (by C-G formula based on Cr of 0.59).  Coagulation profile  Recent Labs Lab 04/25/15 1330 04/26/15 2141 04/27/15 0110  INR 1.39 1.31 1.42    Cardiac Enzymes No results for input(s): CKMB, TROPONINI, MYOGLOBIN in the last 168 hours.  Invalid  input(s): CK  Invalid input(s): POCBNP No results for input(s): DDIMER in the last 72 hours.  Recent Labs  04/25/15 1430  HGBA1C 5.5   No results for input(s): CHOL, HDL, LDLCALC, TRIG, CHOLHDL, LDLDIRECT in the last 72 hours. No results for input(s): TSH, T4TOTAL, T3FREE, THYROIDAB in the last 72 hours.  Invalid input(s): FREET3 No results for input(s): VITAMINB12, FOLATE, FERRITIN, TIBC, IRON, RETICCTPCT in the last 72 hours. No results  for input(s): LIPASE, AMYLASE in the last 72 hours.  Urine Studies No results for input(s): UHGB, CRYS in the last 72 hours.  Invalid input(s): UACOL, UAPR, USPG, UPH, UTP, UGL, UKET, UBIL, UNIT, UROB, ULEU, UEPI, UWBC, URBC, UBAC, CAST, UCOM, BILUA  MICROBIOLOGY: Recent Results (from the past 240 hour(s))  Urine culture     Status: None (Preliminary result)   Collection Time: 04/26/15  9:34 PM  Result Value Ref Range Status   Specimen Description URINE, CATHETERIZED  Final   Special Requests NONE  Final   Culture >=100,000 COLONIES/mL GRAM NEGATIVE RODS  Final   Report Status PENDING  Incomplete  Blood Culture (routine x 2)     Status: None (Preliminary result)   Collection Time: 04/26/15  9:41 PM  Result Value Ref Range Status   Specimen Description BLOOD LEFT ANTECUBITAL  Final   Special Requests BOTTLES DRAWN AEROBIC AND ANAEROBIC 4CC  Final   Culture NO GROWTH < 24 HOURS  Final   Report Status PENDING  Incomplete  Blood Culture (routine x 2)     Status: None (Preliminary result)   Collection Time: 04/26/15  9:41 PM  Result Value Ref Range Status   Specimen Description BLOOD BLOOD LEFT FOREARM  Final   Special Requests BOTTLES DRAWN AEROBIC AND ANAEROBIC 5CC  Final   Culture NO GROWTH < 24 HOURS  Final   Report Status PENDING  Incomplete    RADIOLOGY STUDIES/RESULTS: Dg Chest Port 1 View  04/26/2015  CLINICAL DATA:  Fever.  Generalized weakness. EXAM: PORTABLE CHEST 1 VIEW COMPARISON:  03/02/2015 FINDINGS: Right chest port  remains in place. Previous patchy right lung base opacity has resolved. No new consolidation. Heart size and mediastinal contours are unchanged, borderline mild cardiomegaly. Vascular congestion versus vascular prominence secondary to technique. No large pleural effusion or pneumothorax. IMPRESSION: 1. Question of vascular congestion versus vascular prominence secondary to technique. 2. Previous right lung base opacity has resolved.  No new pneumonia. Electronically Signed   By: Jeb Levering M.D.   On: 04/26/2015 22:01   Mm Digital Diagnostic Unilat R  04/10/2015  CLINICAL DATA:  Status post ultrasound-guided biopsies of 2 right breast masses today. EXAM: DIAGNOSTIC RIGHT MAMMOGRAM POST ULTRASOUND BIOPSY COMPARISON:  Previous exam(s). FINDINGS: Mammographic images were obtained following ultrasound guided biopsy of 2 right breast masses. The smaller of the 2 masses located at the 2:30 o'clock axis, 7 cm from the nipple. The largest mass located at the 2 o'clock axis, 5 cm from the nipple. At the conclusion of the first biopsy at the 2:30 o'clock axis, a ribbon shaped tissue marker was placed at the biopsy site. At the conclusion of the second biopsy at the 2 o'clock axis, a coil shaped tissue marker was placed at the biopsy site. Both clips appear well positioned at the respective biopsy sites. The coil shaped clip corresponds to the site of the initial mammographically detected mass. IMPRESSION: Postprocedure mammogram for clip placement. Both biopsy clips appear well positioned. Final Assessment: Post Procedure Mammograms for Marker Placement Electronically Signed   By: Franki Cabot M.D.   On: 04/10/2015 16:29   US Breast Ltd Uni Right Inc Axilla  04/11/2015  ADDENDUM REPORT: 04/11/2015 14:20 ADDENDUM: Correction of laterality: In the body of the report, all masses described were in the right breast. Specifically, the mass at 2 o'clock, 5 cm from the nipple described in the sonographic findings measuring  1.3 x 0.5 x 0.5 cm is in the RIGHT breast. Electronically Signed  By: Ammie Ferrier M.D.   On: 04/11/2015 14:20  04/11/2015  CLINICAL DATA:  Screening recall for a possible right breast mass. EXAM: DIGITAL DIAGNOSTIC RIGHT MAMMOGRAM WITH 3D TOMOSYNTHESIS AND CAD RIGHT BREAST ULTRASOUND COMPARISON:  Previous exam(s). ACR Breast Density Category b: There are scattered areas of fibroglandular density. FINDINGS: In the medial slightly upper right breast, posterior depth there is an oval mass measuring approximately 1 cm. The margins appear indistinct. On the MLO image, there is also a possible smaller asymmetry at approximately 3 o'clock measuring 0.5 cm. Mammographic images were processed with CAD. Physical exam of the medial right breast demonstrates Ultrasound targeted to the medial right breast at 3 o'clock, 7 cm from the note demonstrates a small irregular hypoechoic mass measuring 0.4 x 0.2 x 0.6 cm. There is a second larger mass in the upper inner left breast at 2 o'clock, 5 cm from the nipple measuring 1.3 x 0.5 x 0.5 cm. These 2 masses lie approximately 3.3 cm apart, and the tissue including both masses spans at least 4.3 cm. No internal vascularity can be documented in either mass with color Doppler examination. Ultrasound of the right axilla demonstrates normal-appearing lymph nodes within cortices. IMPRESSION: 1. There are 2 suspicious masses in the right breast at 2 and 3 o'clock respectively. RECOMMENDATION: Ultrasound-guided biopsy is recommended for the 2 suspicious masses in the right breast. This has been scheduled for 04/10/2015 at 3 p.m. I have discussed the findings and recommendations with the patient. Results were also provided in writing at the conclusion of the visit. If applicable, a reminder letter will be sent to the patient regarding the next appointment. BI-RADS CATEGORY  4: Suspicious. Electronically Signed: By: Ammie Ferrier M.D. On: 04/04/2015 10:24   Mm Diag Breast Tomo Uni  Right  04/11/2015  ADDENDUM REPORT: 04/11/2015 14:20 ADDENDUM: Correction of laterality: In the body of the report, all masses described were in the right breast. Specifically, the mass at 2 o'clock, 5 cm from the nipple described in the sonographic findings measuring 1.3 x 0.5 x 0.5 cm is in the RIGHT breast. Electronically Signed   By: Ammie Ferrier M.D.   On: 04/11/2015 14:20  04/11/2015  CLINICAL DATA:  Screening recall for a possible right breast mass. EXAM: DIGITAL DIAGNOSTIC RIGHT MAMMOGRAM WITH 3D TOMOSYNTHESIS AND CAD RIGHT BREAST ULTRASOUND COMPARISON:  Previous exam(s). ACR Breast Density Category b: There are scattered areas of fibroglandular density. FINDINGS: In the medial slightly upper right breast, posterior depth there is an oval mass measuring approximately 1 cm. The margins appear indistinct. On the MLO image, there is also a possible smaller asymmetry at approximately 3 o'clock measuring 0.5 cm. Mammographic images were processed with CAD. Physical exam of the medial right breast demonstrates Ultrasound targeted to the medial right breast at 3 o'clock, 7 cm from the note demonstrates a small irregular hypoechoic mass measuring 0.4 x 0.2 x 0.6 cm. There is a second larger mass in the upper inner left breast at 2 o'clock, 5 cm from the nipple measuring 1.3 x 0.5 x 0.5 cm. These 2 masses lie approximately 3.3 cm apart, and the tissue including both masses spans at least 4.3 cm. No internal vascularity can be documented in either mass with color Doppler examination. Ultrasound of the right axilla demonstrates normal-appearing lymph nodes within cortices. IMPRESSION: 1. There are 2 suspicious masses in the right breast at 2 and 3 o'clock respectively. RECOMMENDATION: Ultrasound-guided biopsy is recommended for the 2 suspicious masses in the right  breast. This has been scheduled for 04/10/2015 at 3 p.m. I have discussed the findings and recommendations with the patient. Results were also  provided in writing at the conclusion of the visit. If applicable, a reminder letter will be sent to the patient regarding the next appointment. BI-RADS CATEGORY  4: Suspicious. Electronically Signed: By: Ammie Ferrier M.D. On: 04/04/2015 10:24   Mm Screening Breast Tomo Bilateral  03/31/2015  CLINICAL DATA:  Screening. EXAM: DIGITAL SCREENING BILATERAL MAMMOGRAM WITH 3D TOMO WITH CAD COMPARISON:  Previous exam(s). ACR Breast Density Category b: There are scattered areas of fibroglandular density. FINDINGS: In the right breast, a possible mass with calcifications warrants further evaluation. In the left breast, no findings suspicious for malignancy. Images were processed with CAD. IMPRESSION: Further evaluation is suggested for possible mass with calcifications in the right breast. RECOMMENDATION: Diagnostic mammogram and possibly ultrasound of the right breast. (Code:[id number]) The patient will be contacted regarding the findings, and additional imaging will be scheduled. BI-RADS CATEGORY  0: Incomplete. Need additional imaging evaluation and/or prior mammograms for comparison. Electronically Signed   By: Altamese Cabal M.D.   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 This was found to be concordant by Dr. Franki Cabot. Pathology results were discussed with the patient by telephone. The patient reported doing well after the biopsies. Post biopsy instructions and care were reviewed and questions were answered. The patient was encouraged to call [facility] for any additional concerns. Surgical consultation  has been arranged with Dr. Fanny Skates at [facility] on April 15, 2015. Pathology results reported by Terie Purser, RN on 04/11/2015. Electronically Signed   By: Franki Cabot M.D.   On: 04/11/2015 10:46  04/11/2015  CLINICAL DATA:  Patient with 2 right breast masses presents today for ultrasound-guided biopsies. EXAM: ULTRASOUND GUIDED RIGHT BREAST CORE NEEDLE BIOPSY COMPARISON:  Previous exam(s). PROCEDURE: I met with the patient and we discussed the procedure of ultrasound-guided biopsy, including benefits and alternatives. We discussed the high likelihood of a successful procedure. We discussed the risks of the procedure including infection, bleeding, tissue injury, clip migration, and inadequate sampling. Informed written consent was given. The usual time-out protocol was performed immediately prior to the procedure. Preprocedure ultrasound was performed revealing 2 masses which were described on recent diagnostic imaging. Today, the smaller mass is localized to the 2:30 o'clock axis, 7 cm from the nipple. Today, the larger mass is localized to 2 o'clock axis, 5 cm from nipple. Using sterile technique and 1% Lidocaine as local anesthetic, under direct ultrasound visualization, a 12 gauge spring-loaded device was used to perform biopsy of the smaller mass at the 2:30 o'clock axisusing an inferior approach. At the conclusion of the procedure, a ribbon shaped tissue marker clip was deployed into the biopsy cavity. Next, using sterile technique and 1% lidocaine as local anesthetic, under direct ultrasound visualization, a 12 gauge spring-loaded device was used to perform biopsy of the larger mass at the 2 o'clock axis using an inferior approach. At the conclusion of the procedure, a clip shaped coil was placed at the biopsy site. Follow-up 2-view mammogram  was performed and dictated separately. IMPRESSION: Ultrasound-guided biopsy of the 2 masses within the right breast at the 2:30 o'clock axis  (smaller mass) and 2 o'clock axis (larger mass) respectively. No apparent complications. Electronically Signed: By: Franki Cabot M.D. 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 This was found to be concordant by Dr. Franki Cabot. Pathology results were discussed with the patient by telephone. The patient reported doing well after the biopsies. Post biopsy instructions and care were reviewed and questions were answered. The patient was encouraged to call [facility] for any additional concerns. Surgical consultation has been arranged with Dr. Fanny Skates at [facility] on April 15, 2015. Pathology results reported by Terie Purser, RN on 04/11/2015. Electronically Signed   By: Franki Cabot M.D.   On: 04/11/2015 10:46  04/11/2015  CLINICAL DATA:  Patient with 2 right breast masses presents today for ultrasound-guided biopsies. EXAM: ULTRASOUND GUIDED RIGHT BREAST CORE NEEDLE BIOPSY COMPARISON:  Previous exam(s). PROCEDURE: I met with the patient and we discussed the procedure of ultrasound-guided biopsy, including benefits and alternatives. We discussed the high likelihood of a successful procedure. We discussed the risks of the procedure including infection, bleeding, tissue injury, clip migration, and inadequate sampling. Informed written consent was given. The usual time-out protocol was performed immediately prior to the procedure. Preprocedure ultrasound was performed revealing 2 masses which were described on recent diagnostic imaging. Today, the smaller mass is localized to the 2:30 o'clock  axis, 7 cm from the nipple. Today, the larger mass is localized to 2 o'clock axis, 5 cm from nipple. Using sterile technique and 1% Lidocaine as local anesthetic, under direct ultrasound visualization, a 12 gauge spring-loaded device was used to perform biopsy of the smaller mass at the 2:30 o'clock axisusing an inferior approach. At the conclusion of the procedure, a ribbon shaped tissue marker clip was deployed into the biopsy cavity. Next, using sterile technique and 1% lidocaine as local anesthetic, under direct ultrasound visualization, a 12 gauge spring-loaded device was used to perform biopsy of the larger mass at the 2 o'clock axis using an inferior approach. At the conclusion of the procedure, a clip shaped coil was placed at the biopsy site. Follow-up 2-view mammogram was performed and dictated separately. IMPRESSION: Ultrasound-guided biopsy of the 2 masses within the right breast at the 2:30 o'clock axis (smaller mass) and 2 o'clock axis (larger mass) respectively. No apparent complications. Electronically Signed: By: Franki Cabot M.D. On: 04/10/2015 16:18    Oren Binet, MD  [facility] Pager:[phone]  If 7PM-7AM, please contact night-coverage [email or web address] Password [facility] 04/28/2015, 11:54 AM   LOS: 2 days

## 2015-04-28 NOTE — Progress Notes (Signed)
Pt vomited a small amount at dinner.  RN informed pt not to eat anything else until nausea subsides.  MD notified.

## 2015-04-28 NOTE — Discharge Instructions (Signed)

## 2015-04-28 NOTE — Progress Notes (Signed)
Pt is on NIV for the night pt is tolerating it well. RN aware. Pt is stable at this time.

## 2015-04-29 ENCOUNTER — Inpatient Hospital Stay (HOSPITAL_COMMUNITY): Payer: Medicare Other

## 2015-04-29 LAB — BASIC METABOLIC PANEL
ANION GAP: 11 (ref 5–15)
BUN: 12 mg/dL (ref 6–20)
CO2: 18 mmol/L — ABNORMAL LOW (ref 22–32)
Calcium: 8.6 mg/dL — ABNORMAL LOW (ref 8.9–10.3)
Chloride: 104 mmol/L (ref 101–111)
Creatinine, Ser: 0.72 mg/dL (ref 0.44–1.00)
GFR calc Af Amer: >60 mL/min (ref >60)
Glucose, Bld: 132 mg/dL — ABNORMAL HIGH (ref 65–99)
POTASSIUM: 3.6 mmol/L (ref 3.5–5.1)
SODIUM: 133 mmol/L — AB (ref 135–145)

## 2015-04-29 LAB — GLUCOSE, CAPILLARY
GLUCOSE-CAPILLARY: 133 mg/dL — AB (ref 65–99)
GLUCOSE-CAPILLARY: 137 mg/dL — AB (ref 65–99)
Glucose-Capillary: 155 mg/dL — ABNORMAL HIGH (ref 65–99)
Glucose-Capillary: 147 mg/dL — ABNORMAL HIGH (ref 65–99)

## 2015-04-29 LAB — CBC
HCT: 30 % — ABNORMAL LOW (ref 36.0–46.0)
Hemoglobin: 10.2 g/dL — ABNORMAL LOW (ref 12.0–15.0)
MCH: 28.7 pg (ref 26.0–34.0)
MCHC: 34 g/dL (ref 30.0–36.0)
MCV: 84.5 fL (ref 78.0–100.0)
PLATELETS: 132 10*3/uL — AB (ref 150–400)
RBC: 3.55 MIL/uL — AB (ref 3.87–5.11)
RDW: 15.8 % — ABNORMAL HIGH (ref 11.5–15.5)
WBC: 9.8 10*3/uL (ref 4.0–10.5)

## 2015-04-29 LAB — URINE CULTURE: Culture: >=100000

## 2015-04-29 MED ORDER — FUROSEMIDE 10 MG/ML IJ SOLN
20.0000 mg | Freq: Once | INTRAMUSCULAR | Status: AC
  Administered 2015-04-29: 20 mg via INTRAVENOUS
  Filled 2015-04-29: qty 2

## 2015-04-29 MED ORDER — POTASSIUM CHLORIDE CRYS ER 20 MEQ PO TBCR
20.0000 meq | EXTENDED_RELEASE_TABLET | Freq: Once | ORAL | Status: AC
  Administered 2015-04-29: 20 meq via ORAL
  Filled 2015-04-29: qty 1

## 2015-04-29 MED ORDER — DEXTROSE 5 % IV SOLN
1.0000 g | INTRAVENOUS | Status: DC
  Administered 2015-04-29: 1 g via INTRAVENOUS
  Filled 2015-04-29 (×2): qty 10

## 2015-04-29 NOTE — Progress Notes (Addendum)
General Surgery:  I was notified yesterday afternoon this patient had been hospitalized on Feb. 25th for presumed sepsis of uncertain etiology, hypokalemia, chronic atrial fibrillation with rate controlled, type 2 diabetes, uncomplicated asthma.    She has a history of lymphoma in remission and is followed by Dr. Alvy Bimler.  I have recently evaluated her for multifocal cancer of the right breast, lower inner quadrant, estrogen receptor positive..  She is not felt to be a candidate for radiation therapy or chemotherapy due to her poor performance status.  Following multidisciplinary consultation, we decided to proceed with right total mastectomy and offer adjuvant antiestrogen therapy once recovered..  That surgery was planned for this Friday, March 3.  She seems to be stable.  Is alert and cooperative this morning.  In light of these acute medical problems, I will cancel her breast surgery for this Friday and reschedule it for mid to late March.  I have discussed this with the patient and she is very comfortable with this. She has seen her cardiologist, Dr. Cheron Schaumann and she knows to stop her Xarelto 3-4 days preop and she has Lovenox vials and syringes at home and will take a Lovenox bridge prior to her mastectomy.  I will instruct my office today to reschedule her surgery for mid to late March.  ADDENDUM: Right mastectomy rescheduled for March 28.    Edsel Petrin. Dalbert Batman, M.D., [facility] General and Minimally invasive Surgery Breast and Colorectal Surgery Office:   [phone] Pager:   [phone]

## 2015-04-29 NOTE — Progress Notes (Signed)
ANTIBIOTIC CONSULT NOTE - INITIAL  Pharmacy Consult for Cefepime Indication: r/o sepsis  Allergies  Allergen Reactions  . Morphine And Related Anaphylaxis and Other (See Comments)    Pt states she stopped breathing post op- "went into resp. arrest"  . Multaq [Dronedarone] Other (See Comments)    Reaction:  Blood in urine and elevated liver enzymes.   . Demerol Nausea And Vomiting  . Oysters [Shellfish Allergy] Rash    & CLAMS  . Penicillins Itching and Rash    Has patient had a PCN reaction causing immediate rash, facial/tongue/throat swelling, SOB or lightheadedness with hypotension: No Has patient had a PCN reaction causing severe rash involving mucus membranes or skin necrosis: Yes Has patient had a PCN reaction that required hospitalization No Has patient had a PCN reaction occurring within the last 10 years: No If all of the above answers are "NO", then may proceed with Cephalosporin use.   [name] React[facility] Rash    Patient Measurements: Height: 5\' 6"  (167.6 cm) Weight: 141 lb (63.957 kg) (scale b) IBW/kg (Calculated) : 59.3 Adjusted Body Weight:   Vital Signs: Temp: 98.5 F (36.9 C) (02/28 0352) Temp Source: Oral (02/28 0352) BP: 116/71 mmHg (02/28 0922) Pulse Rate: 120 (02/28 0922) Intake/Output from previous day: 02/27 0701 - 02/28 0700 In: 820 [P.O.:720; IV Piggyback:100] Out: 3550 [Urine:3550] Intake/Output from this shift: Total I/O In: 410 [P.O.:360; IV Piggyback:50] Out: 100 [Urine:100]  Labs:  Recent Labs  04/27/15 0506 04/28/15 0326 04/29/15 0556  WBC 16.4* 10.9* 9.8  HGB 9.4* 9.8* 10.2*  PLT 142* 130* 132*  CREATININE 0.60 0.59 0.72   Estimated Creatinine Clearance: 56 mL/min (by C-G formula based on Cr of 0.72). No results for input(s): VANCOTROUGH, VANCOPEAK, VANCORANDOM, GENTTROUGH, GENTPEAK, GENTRANDOM, TOBRATROUGH, TOBRAPEAK, TOBRARND, AMIKACINPEAK, AMIKACINTROU, AMIKACIN in the last 72 hours.   Microbiology:   Medical  History: Past Medical History  Diagnosis Date  . Dysrhythmia     A fib  . Sleep apnea   . Asthma   . Pneumonia   . Hypothyroidism   . Diabetes mellitus without complication ([place])   . Chronic kidney disease     recurring UTI  . Arthritis   . Cancer ([place])     lymphoma and breast   Assessment:  Infectious Disease: Cefepime for r/o sepsis. WBC 16.4>10.9>9.8, Tm 99.8. Currently afebrile, SCr 0.72, CrCl~50-60 ml/min, PCT 4.44, LA 1.7>1.1   2/25 Vanc>>2/27 2/25 Cefepime>>   2/25 BCx2>> 2/25 UCx>> >100,000 Klebsiella P.  Goal of Therapy:  Eradication of infection   Plan:  Cefepime 2g IV q8hr dose ok Please highly consider narrowing abx to Keflex, Cipro, or Septra   Sammi Stolarz S. Alford Highland, PharmD, BCPS Clinical Staff Pharmacist Pager [phone]  Wayland Salinas 04/29/2015,1:28 PM

## 2015-04-29 NOTE — Progress Notes (Signed)
PATIENT DETAILS Name: Patty [name] Age: 75 y.o. Sex: female Date of Birth: 1940/06/21 Admit Date: 04/26/2015 Admitting Physician Evalee Mutton Kristeen Mans, MD OIN:OMVEH,MCNOBS DAVIDSON, MD  Subjective: No longer short of breath and feels much better-requesting discharge.  Assessment/Plan: Principal Problem: Sepsis: Sepsis pathophysiology has resolved, suspect secondary to UTI, although UA not very convincing. Urine culture positive for pansensitive Escherichia coli, and narrow antibiotics down to Rocephin. Ambulate/mobilize today, if continues to do well, home tomorrow morning.   Active Problems: Hypokalemia: Repleted.  Chronic atrial fibrillation: Rate controlled, continue flecainide and Cardizem. Anticoagulated with Xarelto.   Type 2 diabetes: CBGs stable with SSI, hold metformin-resume on discharge.   History of lymphoma/acquired hypogammaglobulinemia: On maintenance Rituxan for lymphoma, for hypogammaglobulinemia, gets periodic IVIG.   Recently diagnosed right breast cancer: Initially was scheduled for mastectomy next week-given sepsis-this has been postponed mid-March  Hypothyroidism: Continue levothyroxine.  Dyslipidemia: Continue statin.  History of bronchial asthma: Lungs clear, as needed bronchodilators.  Disposition: Remain inpatient-home tomorrow if continues to improve  Antimicrobial agents  See below  Anti-infectives    Start     Dose/Rate Route Frequency Ordered Stop   04/29/15 1345  cefTRIAXone (ROCEPHIN) 1 g in dextrose 5 % 50 mL IVPB     1 g 100 mL/hr over 30 Minutes Intravenous Every 24 hours 04/29/15 1343     04/27/15 1000  vancomycin (VANCOCIN) 500 mg in sodium chloride 0.9 % 100 mL IVPB  Status:  Discontinued     500 mg 100 mL/hr over 60 Minutes Intravenous Every 12 hours 04/26/15 2158 04/28/15 0844   04/27/15 0600  ceFEPIme (MAXIPIME) 2 g in dextrose 5 % 50 mL IVPB  Status:  Discontinued     2 g 100 mL/hr over 30 Minutes Intravenous  3 times per day 04/26/15 2158 04/29/15 1343   04/26/15 2145  levofloxacin (LEVAQUIN) IVPB 750 mg  Status:  Discontinued     750 mg 100 mL/hr over 90 Minutes Intravenous  Once 04/26/15 2131 04/26/15 2139   04/26/15 2145  aztreonam (AZACTAM) 2 g in dextrose 5 % 50 mL IVPB  Status:  Discontinued     2 g 100 mL/hr over 30 Minutes Intravenous  Once 04/26/15 2131 04/26/15 2131   04/26/15 2145  vancomycin (VANCOCIN) IVPB 1000 mg/200 mL premix     1,000 mg 200 mL/hr over 60 Minutes Intravenous  Once 04/26/15 2131 04/27/15 0036   04/26/15 2145  aztreonam (AZACTAM) 1 g in dextrose 5 % 50 mL IVPB  Status:  Discontinued     1 g 100 mL/hr over 30 Minutes Intravenous  Once 04/26/15 2131 04/26/15 2139   04/26/15 2145  ceFEPIme (MAXIPIME) 2 g in dextrose 5 % 50 mL IVPB     2 g 100 mL/hr over 30 Minutes Intravenous STAT 04/26/15 2142 04/26/15 2224      DVT Prophylaxis: Xarelto  Code Status: Full code  Family Communication None at bedside  Procedures: None  CONSULTS:  None  Time spent 25 minutes-Greater than 50% of this time was spent in counseling, explanation of diagnosis, planning of further management, and coordination of care.  MEDICATIONS: Scheduled Meds: . cefTRIAXone (ROCEPHIN)  IV  1 g Intravenous Q24H  . cyanocobalamin  500 mcg Oral Daily  . diltiazem  120 mg Oral Daily  . flecainide  50 mg Oral BID  . furosemide  20 mg Intravenous Once  . insulin aspart  0-9 Units  Subcutaneous TID WC  . levothyroxine  225 mcg Oral QAC breakfast  . montelukast  10 mg Oral QHS  . potassium chloride  20 mEq Oral Once  . pravastatin  20 mg Oral Once per day on Mon Tue Wed Thu  . rivaroxaban  20 mg Oral Q supper   Continuous Infusions:   PRN Meds:.acetaminophen **OR** acetaminophen, albuterol, alum & mag hydroxide-simeth, metoCLOPramide    PHYSICAL EXAM: Vital signs in last 24 hours: Filed Vitals:   04/28/15 2013 04/28/15 2324 04/29/15 0352 04/29/15 0922  BP: 114/79  110/75 116/71    Pulse: 116 106 117 120  Temp: 99.8 F (37.7 C)  98.5 F (36.9 C)   TempSrc: Oral  Oral   Resp: '18 18 18   ' Height:      Weight:   63.957 kg (141 lb)   SpO2: 94% 96% 95%     Weight change: 0.325 kg (11.5 oz) Filed Weights   04/27/15 1051 04/28/15 0731 04/29/15 0352  Weight: 65.9 kg (145 lb 4.5 oz) 66.225 kg (146 lb) 63.957 kg (141 lb)   Body mass index is 22.77 kg/(m^2).   Gen Exam: Awake and alert with clear speech.   Neck: Supple, No JVD.   Chest: Bilaterally clear. CVS: S1 S2 Regular, no murmurs.  Abdomen: soft, BS +, non tender, non distended.  Extremities: no edema, lower extremities warm to touch. Neurologic: Non Focal.   Skin: No Rash.   Wounds: N/A.   Intake/Output from previous day:  Intake/Output Summary (Last 24 hours) at 04/29/15 1347 Last data filed at 04/29/15 1336  Gross per 24 hour  Intake    990 ml  Output   1150 ml  Net   -160 ml     LAB RESULTS: CBC  Recent Labs Lab 04/24/15 0818 04/26/15 2141 04/27/15 0506 04/28/15 0326 04/29/15 0556  WBC 5.7 17.7* 16.4* 10.9* 9.8  HGB 10.3* 10.2* 9.4* 9.8* 10.2*  HCT 32.0* 33.1* 29.6* 31.1* 30.0*  PLT 201 168 142* 130* 132*  MCV 85.7 88.0 87.8 86.9 84.5  MCH 27.5 27.1 27.9 27.4 28.7  MCHC 32.1 30.8 31.8 31.5 34.0  RDW 17.4* 16.1* 16.7* 16.1* 15.8*  LYMPHSABS 1.1 0.8  --  0.6*  --   MONOABS 0.5 1.6*  --  0.7  --   EOSABS 0.1 0.0  --  0.0  --   BASOSABS 0.0 0.0  --  0.0  --     Chemistries   Recent Labs Lab 04/24/15 0818 04/26/15 2141 04/27/15 0506 04/28/15 0326 04/29/15 0556  NA 139 135 136 132* 133*  K 3.8 3.6 3.0* 3.0* 3.6  CL  --  100* 107 106 104  CO2 24 22 20* 18* 18*  GLUCOSE 123 133* 139* 138* 132*  BUN 15.'1 16 13 10 12  ' CREATININE 0.8 0.82 0.60 0.59 0.72  CALCIUM 9.5 9.2 7.8* 8.2* 8.6*  MG  --   --   --  1.5*  --     CBG:  Recent Labs Lab 04/27/15 2330 04/28/15 0746 04/28/15 1129 04/28/15 1632 04/29/15 0601  GLUCAP 115* 137* 158* 134* 133*    GFR Estimated  Creatinine Clearance: 56 mL/min (by C-G formula based on Cr of 0.72).  Coagulation profile  Recent Labs Lab 04/25/15 1330 04/26/15 2141 04/27/15 0110  INR 1.39 1.31 1.42    Cardiac Enzymes No results for input(s): CKMB, TROPONINI, MYOGLOBIN in the last 168 hours.  Invalid input(s): CK  Invalid input(s): POCBNP No results for input(s): DDIMER  in the last 72 hours. No results for input(s): HGBA1C in the last 72 hours. No results for input(s): CHOL, HDL, LDLCALC, TRIG, CHOLHDL, LDLDIRECT in the last 72 hours. No results for input(s): TSH, T4TOTAL, T3FREE, THYROIDAB in the last 72 hours.  Invalid input(s): FREET3 No results for input(s): VITAMINB12, FOLATE, FERRITIN, TIBC, IRON, RETICCTPCT in the last 72 hours. No results for input(s): LIPASE, AMYLASE in the last 72 hours.  Urine Studies No results for input(s): UHGB, CRYS in the last 72 hours.  Invalid input(s): UACOL, UAPR, USPG, UPH, UTP, UGL, UKET, UBIL, UNIT, UROB, ULEU, UEPI, UWBC, URBC, UBAC, CAST, UCOM, BILUA  MICROBIOLOGY: Recent Results (from the past 240 hour(s))  Urine culture     Status: None   Collection Time: 04/26/15  9:34 PM  Result Value Ref Range Status   Specimen Description URINE, CATHETERIZED  Final   Special Requests NONE  Final   Culture >=100,000 COLONIES/mL KLEBSIELLA PNEUMONIAE  Final   Report Status 04/29/2015 FINAL  Final   Organism ID, Bacteria KLEBSIELLA PNEUMONIAE  Final      Susceptibility   Klebsiella pneumoniae - MIC*    AMPICILLIN >=32 RESISTANT Resistant     CEFAZOLIN <=4 SENSITIVE Sensitive     CEFTRIAXONE <=1 SENSITIVE Sensitive     CIPROFLOXACIN <=0.25 SENSITIVE Sensitive     GENTAMICIN <=1 SENSITIVE Sensitive     IMIPENEM <=0.25 SENSITIVE Sensitive     NITROFURANTOIN 32 SENSITIVE Sensitive     TRIMETH/SULFA <=20 SENSITIVE Sensitive     AMPICILLIN/SULBACTAM 4 SENSITIVE Sensitive     PIP/TAZO <=4 SENSITIVE Sensitive     * >=100,000 COLONIES/mL KLEBSIELLA PNEUMONIAE  Blood  Culture (routine x 2)     Status: None (Preliminary result)   Collection Time: 04/26/15  9:41 PM  Result Value Ref Range Status   Specimen Description BLOOD LEFT ANTECUBITAL  Final   Special Requests BOTTLES DRAWN AEROBIC AND ANAEROBIC 4CC  Final   Culture NO GROWTH 2 DAYS  Final   Report Status PENDING  Incomplete  Blood Culture (routine x 2)     Status: None (Preliminary result)   Collection Time: 04/26/15  9:41 PM  Result Value Ref Range Status   Specimen Description BLOOD BLOOD LEFT FOREARM  Final   Special Requests BOTTLES DRAWN AEROBIC AND ANAEROBIC 5CC  Final   Culture NO GROWTH 2 DAYS  Final   Report Status PENDING  Incomplete    RADIOLOGY STUDIES/RESULTS: Dg Chest Port 1 View  04/29/2015  CLINICAL DATA:  Shortness of breath, history of asthma, lymphoma, breast malignancy. EXAM: PORTABLE CHEST 1 VIEW COMPARISON:  Portable chest x-ray of February 25th 2017 FINDINGS: The lungs are adequately inflated. The pulmonary interstitial markings are diffusely increased and slightly more conspicuous than on the previous study. The pulmonary vascularity is less distinct. The retrocardiac region on the left more dense in there is partial obscuration of the left hemidiaphragm. The cardiac silhouette is top-normal to mildly enlarged. The power port appliance is in stable position. IMPRESSION: Worsening pulmonary interstitial edema or less likely interstitial pneumonia. Left lower lobe atelectasis or pneumonia with small left pleural effusion. When the patient can tolerate the procedure, a PA and lateral chest x-ray would be useful. Electronically Signed   By: David  Martinique M.D.   On: 04/29/2015 07:24   Dg Chest Port 1 View  04/26/2015  CLINICAL DATA:  Fever.  Generalized weakness. EXAM: PORTABLE CHEST 1 VIEW COMPARISON:  03/02/2015 FINDINGS: Right chest port remains in place. Previous patchy right lung  base opacity has resolved. No new consolidation. Heart size and mediastinal contours are unchanged,  borderline mild cardiomegaly. Vascular congestion versus vascular prominence secondary to technique. No large pleural effusion or pneumothorax. IMPRESSION: 1. Question of vascular congestion versus vascular prominence secondary to technique. 2. Previous right lung base opacity has resolved.  No new pneumonia. Electronically Signed   By: Jeb Levering M.D.   On: 04/26/2015 22:01   Mm Digital Diagnostic Unilat R  04/10/2015  CLINICAL DATA:  Status post ultrasound-guided biopsies of 2 right breast masses today. EXAM: DIAGNOSTIC RIGHT MAMMOGRAM POST ULTRASOUND BIOPSY COMPARISON:  Previous exam(s). FINDINGS: Mammographic images were obtained following ultrasound guided biopsy of 2 right breast masses. The smaller of the 2 masses located at the 2:30 o'clock axis, 7 cm from the nipple. The largest mass located at the 2 o'clock axis, 5 cm from the nipple. At the conclusion of the first biopsy at the 2:30 o'clock axis, a ribbon shaped tissue marker was placed at the biopsy site. At the conclusion of the second biopsy at the 2 o'clock axis, a coil shaped tissue marker was placed at the biopsy site. Both clips appear well positioned at the respective biopsy sites. The coil shaped clip corresponds to the site of the initial mammographically detected mass. IMPRESSION: Postprocedure mammogram for clip placement. Both biopsy clips appear well positioned. Final Assessment: Post Procedure Mammograms for Marker Placement Electronically Signed   By: Franki Cabot M.D.   On: 04/10/2015 16:29   US Breast Ltd Uni Right Inc Axilla  04/11/2015  ADDENDUM REPORT: 04/11/2015 14:20 ADDENDUM: Correction of laterality: In the body of the report, all masses described were in the right breast. Specifically, the mass at 2 o'clock, 5 cm from the nipple described in the sonographic findings measuring 1.3 x 0.5 x 0.5 cm is in the RIGHT breast. Electronically Signed   By: Ammie Ferrier M.D.   On: 04/11/2015 14:20  04/11/2015  CLINICAL  DATA:  Screening recall for a possible right breast mass. EXAM: DIGITAL DIAGNOSTIC RIGHT MAMMOGRAM WITH 3D TOMOSYNTHESIS AND CAD RIGHT BREAST ULTRASOUND COMPARISON:  Previous exam(s). ACR Breast Density Category b: There are scattered areas of fibroglandular density. FINDINGS: In the medial slightly upper right breast, posterior depth there is an oval mass measuring approximately 1 cm. The margins appear indistinct. On the MLO image, there is also a possible smaller asymmetry at approximately 3 o'clock measuring 0.5 cm. Mammographic images were processed with CAD. Physical exam of the medial right breast demonstrates Ultrasound targeted to the medial right breast at 3 o'clock, 7 cm from the note demonstrates a small irregular hypoechoic mass measuring 0.4 x 0.2 x 0.6 cm. There is a second larger mass in the upper inner left breast at 2 o'clock, 5 cm from the nipple measuring 1.3 x 0.5 x 0.5 cm. These 2 masses lie approximately 3.3 cm apart, and the tissue including both masses spans at least 4.3 cm. No internal vascularity can be documented in either mass with color Doppler examination. Ultrasound of the right axilla demonstrates normal-appearing lymph nodes within cortices. IMPRESSION: 1. There are 2 suspicious masses in the right breast at 2 and 3 o'clock respectively. RECOMMENDATION: Ultrasound-guided biopsy is recommended for the 2 suspicious masses in the right breast. This has been scheduled for 04/10/2015 at 3 p.m. I have discussed the findings and recommendations with the patient. Results were also provided in writing at the conclusion of the visit. If applicable, a reminder letter will be sent to the patient regarding  the next appointment. BI-RADS CATEGORY  4: Suspicious. Electronically Signed: By: Ammie Ferrier M.D. On: 04/04/2015 10:24   Mm Diag Breast Tomo Uni Right  04/11/2015  ADDENDUM REPORT: 04/11/2015 14:20 ADDENDUM: Correction of laterality: In the body of the report, all masses described  were in the right breast. Specifically, the mass at 2 o'clock, 5 cm from the nipple described in the sonographic findings measuring 1.3 x 0.5 x 0.5 cm is in the RIGHT breast. Electronically Signed   By: Ammie Ferrier M.D.   On: 04/11/2015 14:20  04/11/2015  CLINICAL DATA:  Screening recall for a possible right breast mass. EXAM: DIGITAL DIAGNOSTIC RIGHT MAMMOGRAM WITH 3D TOMOSYNTHESIS AND CAD RIGHT BREAST ULTRASOUND COMPARISON:  Previous exam(s). ACR Breast Density Category b: There are scattered areas of fibroglandular density. FINDINGS: In the medial slightly upper right breast, posterior depth there is an oval mass measuring approximately 1 cm. The margins appear indistinct. On the MLO image, there is also a possible smaller asymmetry at approximately 3 o'clock measuring 0.5 cm. Mammographic images were processed with CAD. Physical exam of the medial right breast demonstrates Ultrasound targeted to the medial right breast at 3 o'clock, 7 cm from the note demonstrates a small irregular hypoechoic mass measuring 0.4 x 0.2 x 0.6 cm. There is a second larger mass in the upper inner left breast at 2 o'clock, 5 cm from the nipple measuring 1.3 x 0.5 x 0.5 cm. These 2 masses lie approximately 3.3 cm apart, and the tissue including both masses spans at least 4.3 cm. No internal vascularity can be documented in either mass with color Doppler examination. Ultrasound of the right axilla demonstrates normal-appearing lymph nodes within cortices. IMPRESSION: 1. There are 2 suspicious masses in the right breast at 2 and 3 o'clock respectively. RECOMMENDATION: Ultrasound-guided biopsy is recommended for the 2 suspicious masses in the right breast. This has been scheduled for 04/10/2015 at 3 p.m. I have discussed the findings and recommendations with the patient. Results were also provided in writing at the conclusion of the visit. If applicable, a reminder letter will be sent to the patient regarding the next appointment.  BI-RADS CATEGORY  4: Suspicious. Electronically Signed: By: Ammie Ferrier M.D. On: 04/04/2015 10:24   Mm Screening Breast Tomo Bilateral  03/31/2015  CLINICAL DATA:  Screening. EXAM: DIGITAL SCREENING BILATERAL MAMMOGRAM WITH 3D TOMO WITH CAD COMPARISON:  Previous exam(s). ACR Breast Density Category b: There are scattered areas of fibroglandular density. FINDINGS: In the right breast, a possible mass with calcifications warrants further evaluation. In the left breast, no findings suspicious for malignancy. Images were processed with CAD. IMPRESSION: Further evaluation is suggested for possible mass with calcifications in the right breast. RECOMMENDATION: Diagnostic mammogram and possibly ultrasound of the right breast. (Code:[id number]) The patient will be contacted regarding the findings, and additional imaging will be scheduled. BI-RADS CATEGORY  0: Incomplete. Need additional imaging evaluation and/or prior mammograms for comparison. Electronically Signed   By: Altamese Cabal M.D.   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 This was found to be concordant by Dr. Franki Cabot. Pathology results were discussed with the patient  by telephone. The patient reported doing well after the biopsies. Post biopsy instructions and care were reviewed and questions were answered. The patient was encouraged to call [facility] for any additional concerns. Surgical consultation has been arranged with Dr. Fanny Skates at [facility] on April 15, 2015. Pathology results reported by Terie Purser, RN on  04/11/2015. Electronically Signed   By: Franki Cabot M.D.   On: 04/11/2015 10:46  04/11/2015  CLINICAL DATA:  Patient with 2 right breast masses presents today for ultrasound-guided biopsies. EXAM: ULTRASOUND GUIDED RIGHT BREAST CORE NEEDLE BIOPSY COMPARISON:  Previous exam(s). PROCEDURE: I met with the patient and we discussed the procedure of ultrasound-guided biopsy, including benefits and alternatives. We discussed the high likelihood of a successful procedure. We discussed the risks of the procedure including infection, bleeding, tissue injury, clip migration, and inadequate sampling. Informed written consent was given. The usual time-out protocol was performed immediately prior to the procedure. Preprocedure ultrasound was performed revealing 2 masses which were described on recent diagnostic imaging. Today, the smaller mass is localized to the 2:30 o'clock axis, 7 cm from the nipple. Today, the larger mass is localized to 2 o'clock axis, 5 cm from nipple. Using sterile technique and 1% Lidocaine as local anesthetic, under direct ultrasound visualization, a 12 gauge spring-loaded device was used to perform biopsy of the smaller mass at the 2:30 o'clock axisusing an inferior approach. At the conclusion of the procedure, a ribbon shaped tissue marker clip was deployed into the biopsy cavity. Next, using sterile technique and 1% lidocaine as local anesthetic, under direct ultrasound visualization, a 12 gauge spring-loaded device was used to perform biopsy of the larger mass at the 2 o'clock axis using an inferior approach. At the conclusion of the procedure, a clip shaped coil was placed at the biopsy site. Follow-up 2-view mammogram was performed and dictated separately. IMPRESSION: Ultrasound-guided biopsy of the 2 masses within the right breast at the 2:30 o'clock axis (smaller mass) and 2 o'clock axis (larger mass) respectively. No apparent complications. Electronically Signed: By: Franki Cabot M.D. 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 This was found to be concordant by Dr. Franki Cabot. Pathology results were discussed with the patient by telephone. The patient reported doing well after the biopsies. Post biopsy instructions and care were reviewed and questions were answered. The patient was encouraged to call [facility] for any additional concerns. Surgical consultation has been arranged with Dr. Fanny Skates at [facility] on April 15, 2015. Pathology results reported by Terie Purser, RN on 04/11/2015. Electronically Signed   By: Franki Cabot M.D.   On: 04/11/2015 10:46  04/11/2015  CLINICAL DATA:  Patient with 2 right breast masses presents today for ultrasound-guided biopsies. EXAM: ULTRASOUND GUIDED RIGHT BREAST CORE NEEDLE BIOPSY COMPARISON:  Previous exam(s). PROCEDURE: I met with the patient and we discussed the procedure of ultrasound-guided biopsy, including benefits and alternatives. We discussed the high likelihood of a successful procedure. We discussed the risks of the procedure including infection, bleeding, tissue injury, clip migration, and inadequate sampling. Informed written  consent was given. The usual time-out protocol was performed immediately prior to the procedure. Preprocedure ultrasound was performed revealing 2 masses which were described on recent diagnostic imaging. Today, the smaller mass is localized to the 2:30 o'clock axis, 7 cm from the nipple. Today, the larger mass is localized to 2 o'clock axis, 5 cm from nipple. Using sterile technique and 1%  Lidocaine as local anesthetic, under direct ultrasound visualization, a 12 gauge spring-loaded device was used to perform biopsy of the smaller mass at the 2:30 o'clock axisusing an inferior approach. At the conclusion of the procedure, a ribbon shaped tissue marker clip was deployed into the biopsy cavity. Next, using sterile technique and 1% lidocaine as local anesthetic, under direct ultrasound visualization, a 12 gauge spring-loaded device was used to perform biopsy of the larger mass at the 2 o'clock axis using an inferior approach. At the conclusion of the procedure, a clip shaped coil was placed at the biopsy site. Follow-up 2-view mammogram was performed and dictated separately. IMPRESSION: Ultrasound-guided biopsy of the 2 masses within the right breast at the 2:30 o'clock axis (smaller mass) and 2 o'clock axis (larger mass) respectively. No apparent complications. Electronically Signed: By: Franki Cabot M.D. On: 04/10/2015 16:18    Oren Binet, MD  [facility] Pager:[phone]  If 7PM-7AM, please contact night-coverage [email or web address] Password [email or web address] 04/29/2015, 1:47 PM   LOS: 3 days

## 2015-04-29 NOTE — Care Management Important Message (Signed)
Important Message  Patient Details  Name: CAMDYNN SNIPES MRN: [id number] Date of Birth: 18-Mar-1940   Medicare Important Message Given:  Yes    Barb Merino Chance 04/29/2015, 12:56 PM

## 2015-04-30 LAB — GLUCOSE, CAPILLARY: GLUCOSE-CAPILLARY: 126 mg/dL — AB (ref 65–99)

## 2015-04-30 LAB — TSH: TSH: 4.198 u[IU]/mL (ref 0.350–4.500)

## 2015-04-30 MED ORDER — CEPHALEXIN 500 MG PO CAPS: 500.0000 mg | ORAL_CAPSULE | Freq: Four times a day (QID) | ORAL | Status: DC

## 2015-04-30 MED ORDER — ENOXAPARIN SODIUM 60 MG/0.6ML ~~LOC~~ SOLN: 60.0000 mg | Freq: Two times a day (BID) | SUBCUTANEOUS | Status: DC

## 2015-04-30 NOTE — Progress Notes (Signed)
Physical Therapy Treatment Patient Details Name: Patty [name] MRN: [id number] DOB: [birth year] Today's Date: 04/30/2015    History of Present Illness Patty [name] is a 75 y.o. female with a history of NHL, Acquired Hypogammaglobinemia, Chronic Atrial fibrillation, DM2, Hypothyroid who presents to the ED with complaints of Fever and chills and confusion today. Her family reports that she usually gets a UTI a few days after her Chemo Rx. She found to have a temperature of 102.3 along with hypotension and tachycardia, and a Sepsis Workup was initiated she was placed on IV Vancomycin and Cefepime and was referred for admission. Plan is for bil mastectomy next week at [facility].    PT Comments    Good progress today. Ambulating 200 feet with a rolling walker for support. Educated on therapeutic exercises. HR at rest 122, up to 125 with gait training. Will continue to follow and progress until d/c.  Follow Up Recommendations  Home health PT;Supervision/Assistance - 24 hour     Equipment Recommendations  None recommended by PT    Recommendations for Other Services OT consult     Precautions / Restrictions Precautions Precautions: Fall Restrictions Weight Bearing Restrictions: No    Mobility  Bed Mobility Overal bed mobility: Independent             General bed mobility comments: incr time to get to EOB.  Transfers Overall transfer level: Needs assistance Equipment used: None Transfers: Sit to/from Stand Sit to Stand: Min guard         General transfer comment: Min guard for safety. Some sway noted and pt reaching for furniture. Encouraged to use RW, which, when introduced, pt demonstrated better stability  Ambulation/Gait Ambulation/Gait assistance: Supervision Ambulation Distance (Feet): 200 Feet Assistive device: Rolling walker (2 wheeled) Gait Pattern/deviations: Decreased step length - right;Decreased stride length;Trunk flexed Gait velocity: decreased Gait  velocity interpretation: Below normal speed for age/gender General Gait Details: Slower but steady gait with use of a rolling walker for support. Intermittent cues for upright posture with forward gaze. No overt loss of balance noted. HR was 122 at rest, only elevated to 125 with gait.   Stairs Stairs:  (declined to practice.)          Wheelchair Mobility    Modified Rankin (Stroke Patients Only)       Balance                                    Cognition Arousal/Alertness: Awake/alert Behavior During Therapy: WFL for tasks assessed/performed Overall Cognitive Status: Within Functional Limits for tasks assessed                      Exercises General Exercises - Lower Extremity Ankle Circles/Pumps: AROM;Both;10 reps;Seated Long Arc Quad: Both;Seated;Strengthening;10 reps Hip Flexion/Marching: Strengthening;Both;10 reps;Seated    General Comments General comments (skin integrity, edema, etc.): States she feels confident with her ability to ascend/descend steps and politely declines to practice with PT today.      Pertinent Vitals/Pain Pain Assessment: No/denies pain    Home Living                      Prior Function            PT Goals (current goals can now be found in the care plan section) Acute Rehab PT Goals Patient Stated Goal: to go home PT Goal Formulation: With  patient Time For Goal Achievement: 05/12/15 Potential to Achieve Goals: Good Progress towards PT goals: Progressing toward goals    Frequency  Min 3X/week    PT Plan Current plan remains appropriate    Co-evaluation             End of Session Equipment Utilized During Treatment: Gait belt Activity Tolerance: Patient tolerated treatment well Patient left: with call bell/phone within reach;in bed     Time: [id number] PT Time Calculation (min) (ACUTE ONLY): 12 min  Charges:  $Gait Training: 8-22 mins                    G Codes:      Ellouise Newer 05/15/2015, 10:04 AM  Elayne Snare, [place]

## 2015-04-30 NOTE — Discharge Summary (Addendum)
PATIENT DETAILS Name: Patty [name] Age: 75 y.o. Sex: female Date of Birth: 1940-04-21 MRN: [id number]. Admitting Physician: Patty Osgood, MD [name] Patty Pouch, MD  Admit Date: 04/26/2015 Discharge date: 04/30/2015  Recommendations for Outpatient Follow-up:  1. TSH pending-please follow-may need to adjust synthroid dose. 2. Follow blood cultures till final. 3. 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 Dysrhythmia     A fib  . Sleep apnea   . Asthma   . Pneumonia   . Hypothyroidism   . Diabetes mellitus without complication ([place])   . Chronic kidney disease     recurring UTI  . Arthritis   . Cancer ([place])     lymphoma and breast    DISCHARGE MEDICATIONS: Current Discharge Medication List    START taking these medications   Details  cephALEXin (KEFLEX) 500 MG capsule Take 1 capsule (500 mg total) by mouth 4 (four) times daily. Qty: 16 capsule, Refills: 0      CONTINUE these medications which have CHANGED   Details  enoxaparin (LOVENOX) 60 MG/0.6ML injection Inject 0.6 mLs (60 mg total) into the skin every 12 (twelve) hours. STOP XARELTO 3-4 DAYS PRIOR TO YOUR MASTECTOMY- THEN USE LOVENOX. (THIS IS PRIOR TO YOUR BREAST SURGERY/MASTECTOMY-PLEASE START THIS ONLY AFTER SPEAKING WITH YOUR SURGEON). Qty: 0 Syringe, Refills: 0      CONTINUE these medications which have NOT CHANGED   Details  acetaminophen (TYLENOL) 500 MG tablet Take 1,000 mg by mouth every 6  (six) hours as needed for moderate pain, fever or headache.    albuterol (PROAIR HFA) 108 (90 BASE) MCG/ACT inhaler Inhale 2 puffs into the lungs every 6 (six) hours as needed for wheezing or shortness of breath.    albuterol (PROVENTIL) (2.5 MG/3ML) 0.083% nebulizer solution Inhale 3 mLs into the lungs every 6 (six) hours as needed for wheezing or shortness of breath.     Cholecalciferol (VITAMIN D) 2000 UNITS tablet Take 2,000 Units by mouth daily with lunch.    diltiazem (CARDIZEM CD) 120 MG 24 hr capsule Take 120 mg by mouth daily.     diphenoxylate-atropine (LOMOTIL) 2.5-0.025 MG per tablet Take 1 tablet by mouth 4 (four) times daily as needed for diarrhea or loose stools. Qty: 90 tablet, Refills: 0   Associated Diagnoses: Diarrhea    flecainide (TAMBOCOR) 100 MG tablet Take 50 mg by mouth 2 (two) times daily.    Fluticasone-Salmeterol (ADVAIR) 250-50 MCG/DOSE AEPB Inhale 2 puffs into the lungs every 12 (twelve) hours.     levothyroxine (SYNTHROID, LEVOTHROID) 75 MCG tablet Take 225 mcg by mouth daily before breakfast.    lidocaine-prilocaine (EMLA) cream Apply 1 application topically as needed. Apply to Patty [name] a Cath site at least one hour prior to needle stick as needed. Qty: 30 g, Refills: 3    Magnesium Oxide 500 MG TABS Take 500 mg by mouth daily.     metFORMIN (GLUCOPHAGE-XR) 500 MG 24 hr tablet Take 500 mg by mouth 2 (two) times  daily with a meal.    montelukast (SINGULAIR) 10 MG tablet Take 10 mg by mouth daily.    ondansetron (ZOFRAN) 8 MG tablet Take 8 mg by mouth every 8 (eight) hours as needed for nausea or vomiting.    pravastatin (PRAVACHOL) 40 MG tablet Take 20 mg by mouth See admin instructions. Take 1/2 tablet (20 mg) ONLY on Monday, Tuesday, Wednesday, and Thursday Nights.    prochlorperazine (COMPAZINE) 10 MG tablet Take 1 tablet (10 mg total) by mouth every 6 (six) hours as needed for nausea (nausea). Qty: 30 tablet, Refills: 6    rivaroxaban (XARELTO) 20 MG  TABS tablet Take 20 mg by mouth daily with supper.    traMADol-acetaminophen (ULTRACET) 37.5-325 MG per tablet Take 1 tablet by mouth every 6 (six) hours as needed. Qty: 30 tablet, Refills: 0    vitamin B-12 (CYANOCOBALAMIN) 500 MCG tablet Take 500 mcg by mouth daily.    PRESCRIPTION MEDICATION Antibody Plan [facility]        ALLERGIES:   Allergies  Allergen Reactions  . Morphine And Related Anaphylaxis and Other (See Comments)    Pt states she stopped breathing post op- "went into resp. arrest"  . Multaq [Dronedarone] Other (See Comments)    Reaction:  Blood in urine and elevated liver enzymes.   . Demerol Nausea And Vomiting  . Oysters [Shellfish Allergy] Rash    & CLAMS  . Penicillins Itching and Rash    Has patient had a PCN reaction causing immediate rash, facial/tongue/throat swelling, SOB or lightheadedness with hypotension: No Has patient had a PCN reaction causing severe rash involving mucus membranes or skin necrosis: Yes Has patient had a PCN reaction that required hospitalization No Has patient had a PCN reaction occurring within the last 10 years: No If all of the above answers are "NO", then may proceed with Cephalosporin use.   . Sulfa Drugs Cross Reactors Rash    BRIEF HPI:  See H&P, Labs, Consult and Test reports for all details in brief, patient  is a 75 y.o. female with a history of NHL, Acquired Hypogammaglobinemia, Chronic Atrial fibrillation, DM2, Hypothyroid who presented to the ED with complaints of Fever and chills and confusion.She found to have a temperature of 102.3 along with hypotension and tachycardia, and a Sepsis Workup was initiated she was placed on IV Vancomycin and Cefepime and was referred for admission.   CONSULTATIONS:   None  PERTINENT RADIOLOGIC STUDIES: Dg Chest Port 1 View  04/29/2015  CLINICAL DATA:  Shortness of breath, history of asthma, lymphoma, breast malignancy. EXAM: PORTABLE CHEST 1 VIEW COMPARISON:  Portable chest x-ray of  February 25th 2017 FINDINGS: The lungs are adequately inflated. The pulmonary interstitial markings are diffusely increased and slightly more conspicuous than on the previous study. The pulmonary vascularity is less distinct. The retrocardiac region on the left more dense in there is partial obscuration of the left hemidiaphragm. The cardiac silhouette is top-normal to mildly enlarged. The power port appliance is in stable position. IMPRESSION: Worsening pulmonary interstitial edema or less likely interstitial pneumonia. Left lower lobe atelectasis or pneumonia with small left pleural effusion. When the patient can tolerate the procedure, a PA and lateral chest x-ray would be useful. Electronically Signed   By: David  Martinique M.D.   On: 04/29/2015 07:24   Dg Chest Port 1 View  04/26/2015  CLINICAL DATA:  Fever.  Generalized weakness. EXAM: PORTABLE CHEST 1 VIEW COMPARISON:  03/02/2015 FINDINGS: Right chest port remains in place. Previous patchy  right lung base opacity has resolved. No new consolidation. Heart size and mediastinal contours are unchanged, borderline mild cardiomegaly. Vascular congestion versus vascular prominence secondary to technique. No large pleural effusion or pneumothorax. IMPRESSION: 1. Question of vascular congestion versus vascular prominence secondary to technique. 2. Previous right lung base opacity has resolved.  No new pneumonia. Electronically Signed   By: Jeb Levering M.D.   On: 04/26/2015 22:01   Mm Digital Diagnostic Unilat R  04/10/2015  CLINICAL DATA:  Status post ultrasound-guided biopsies of 2 right breast masses today. EXAM: DIAGNOSTIC RIGHT MAMMOGRAM POST ULTRASOUND BIOPSY COMPARISON:  Previous exam(s). FINDINGS: Mammographic images were obtained following ultrasound guided biopsy of 2 right breast masses. The smaller of the 2 masses located at the 2:30 o'clock axis, 7 cm from the nipple. The largest mass located at the 2 o'clock axis, 5 cm from the nipple. At the  conclusion of the first biopsy at the 2:30 o'clock axis, a ribbon shaped tissue marker was placed at the biopsy site. At the conclusion of the second biopsy at the 2 o'clock axis, a coil shaped tissue marker was placed at the biopsy site. Both clips appear well positioned at the respective biopsy sites. The coil shaped clip corresponds to the site of the initial mammographically detected mass. IMPRESSION: Postprocedure mammogram for clip placement. Both biopsy clips appear well positioned. Final Assessment: Post Procedure Mammograms for Marker Placement Electronically Signed   By: Franki Cabot M.D.   On: 04/10/2015 16:29   US Breast Ltd Uni Right Inc Axilla  04/11/2015  ADDENDUM REPORT: 04/11/2015 14:20 ADDENDUM: Correction of laterality: In the body of the report, all masses described were in the right breast. Specifically, the mass at 2 o'clock, 5 cm from the nipple described in the sonographic findings measuring 1.3 x 0.5 x 0.5 cm is in the RIGHT breast. Electronically Signed   By: Ammie Ferrier M.D.   On: 04/11/2015 14:20  04/11/2015  CLINICAL DATA:  Screening recall for a possible right breast mass. EXAM: DIGITAL DIAGNOSTIC RIGHT MAMMOGRAM WITH 3D TOMOSYNTHESIS AND CAD RIGHT BREAST ULTRASOUND COMPARISON:  Previous exam(s). ACR Breast Density Category b: There are scattered areas of fibroglandular density. FINDINGS: In the medial slightly upper right breast, posterior depth there is an oval mass measuring approximately 1 cm. The margins appear indistinct. On the MLO image, there is also a possible smaller asymmetry at approximately 3 o'clock measuring 0.5 cm. Mammographic images were processed with CAD. Physical exam of the medial right breast demonstrates Ultrasound targeted to the medial right breast at 3 o'clock, 7 cm from the note demonstrates a small irregular hypoechoic mass measuring 0.4 x 0.2 x 0.6 cm. There is a second larger mass in the upper inner left breast at 2 o'clock, 5 cm from the  nipple measuring 1.3 x 0.5 x 0.5 cm. These 2 masses lie approximately 3.3 cm apart, and the tissue including both masses spans at least 4.3 cm. No internal vascularity can be documented in either mass with color Doppler examination. Ultrasound of the right axilla demonstrates normal-appearing lymph nodes within cortices. IMPRESSION: 1. There are 2 suspicious masses in the right breast at 2 and 3 o'clock respectively. RECOMMENDATION: Ultrasound-guided biopsy is recommended for the 2 suspicious masses in the right breast. This has been scheduled for 04/10/2015 at 3 p.m. I have discussed the findings and recommendations with the patient. Results were also provided in writing at the conclusion of the visit. If applicable, a reminder letter will be sent to the  patient regarding the next appointment. BI-RADS CATEGORY  4: Suspicious. Electronically Signed: By: Ammie Ferrier M.D. On: 04/04/2015 10:24   Mm Diag Breast Tomo Uni Right  04/11/2015  ADDENDUM REPORT: 04/11/2015 14:20 ADDENDUM: Correction of laterality: In the body of the report, all masses described were in the right breast. Specifically, the mass at 2 o'clock, 5 cm from the nipple described in the sonographic findings measuring 1.3 x 0.5 x 0.5 cm is in the RIGHT breast. Electronically Signed   By: Ammie Ferrier M.D.   On: 04/11/2015 14:20  04/11/2015  CLINICAL DATA:  Screening recall for a possible right breast mass. EXAM: DIGITAL DIAGNOSTIC RIGHT MAMMOGRAM WITH 3D TOMOSYNTHESIS AND CAD RIGHT BREAST ULTRASOUND COMPARISON:  Previous exam(s). ACR Breast Density Category b: There are scattered areas of fibroglandular density. FINDINGS: In the medial slightly upper right breast, posterior depth there is an oval mass measuring approximately 1 cm. The margins appear indistinct. On the MLO image, there is also a possible smaller asymmetry at approximately 3 o'clock measuring 0.5 cm. Mammographic images were processed with CAD. Physical exam of the medial  right breast demonstrates Ultrasound targeted to the medial right breast at 3 o'clock, 7 cm from the note demonstrates a small irregular hypoechoic mass measuring 0.4 x 0.2 x 0.6 cm. There is a second larger mass in the upper inner left breast at 2 o'clock, 5 cm from the nipple measuring 1.3 x 0.5 x 0.5 cm. These 2 masses lie approximately 3.3 cm apart, and the tissue including both masses spans at least 4.3 cm. No internal vascularity can be documented in either mass with color Doppler examination. Ultrasound of the right axilla demonstrates normal-appearing lymph nodes within cortices. IMPRESSION: 1. There are 2 suspicious masses in the right breast at 2 and 3 o'clock respectively. RECOMMENDATION: Ultrasound-guided biopsy is recommended for the 2 suspicious masses in the right breast. This has been scheduled for 04/10/2015 at 3 p.m. I have discussed the findings and recommendations with the patient. Results were also provided in writing at the conclusion of the visit. If applicable, a reminder letter will be sent to the patient regarding the next appointment. BI-RADS CATEGORY  4: Suspicious. Electronically Signed: By: Ammie Ferrier M.D. On: 04/04/2015 10:24   Mm Screening Breast Tomo Bilateral  03/31/2015  CLINICAL DATA:  Screening. EXAM: DIGITAL SCREENING BILATERAL MAMMOGRAM WITH 3D TOMO WITH CAD COMPARISON:  Previous exam(s). ACR Breast Density Category b: There are scattered areas of fibroglandular density. FINDINGS: In the right breast, a possible mass with calcifications warrants further evaluation. In the left breast, no findings suspicious for malignancy. Images were processed with CAD. IMPRESSION: Further evaluation is suggested for possible mass with calcifications in the right breast. RECOMMENDATION: Diagnostic mammogram and possibly ultrasound of the right breast. (Code:[id number]) The patient will be contacted regarding the findings, and additional imaging will be scheduled. BI-RADS CATEGORY  0:  Incomplete. Need additional imaging evaluation and/or prior mammograms for comparison. Electronically Signed   By: Altamese Cabal M.D.   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 This was found to be concordant by Dr. Franki Cabot. Pathology results were discussed with  the patient by telephone. The patient reported doing well after the biopsies. Post biopsy instructions and care were reviewed and questions were answered. The patient was encouraged to call [facility] for any additional concerns. Surgical consultation has been arranged with Dr. Fanny Skates at [facility] on April 15, 2015. Pathology results reported by Terie Purser, RN on 04/11/2015. Electronically Signed   By: Franki Cabot M.D.   On: 04/11/2015 10:46  04/11/2015  CLINICAL DATA:  Patient with 2 right breast masses presents today for ultrasound-guided biopsies. EXAM: ULTRASOUND GUIDED RIGHT BREAST CORE NEEDLE BIOPSY COMPARISON:  Previous exam(s). PROCEDURE: I met with the patient and we discussed the procedure of ultrasound-guided biopsy, including benefits and alternatives. We discussed the high likelihood of a successful procedure. We discussed the risks of the procedure including infection, bleeding, tissue injury, clip migration, and inadequate sampling. Informed written consent was given. The usual time-out protocol was performed immediately prior to the procedure. Preprocedure ultrasound was performed revealing 2 masses which were described on recent diagnostic imaging. Today, the smaller mass is localized to the 2:30  o'clock axis, 7 cm from the nipple. Today, the larger mass is localized to 2 o'clock axis, 5 cm from nipple. Using sterile technique and 1% Lidocaine as local anesthetic, under direct ultrasound visualization, a 12 gauge spring-loaded device was used to perform biopsy of the smaller mass at the 2:30 o'clock axisusing an inferior approach. At the conclusion of the procedure, a ribbon shaped tissue marker clip was deployed into the biopsy cavity. Next, using sterile technique and 1% lidocaine as local anesthetic, under direct ultrasound visualization, a 12 gauge spring-loaded device was used to perform biopsy of the larger mass at the 2 o'clock axis using an inferior approach. At the conclusion of the procedure, a clip shaped coil was placed at the biopsy site. Follow-up 2-view mammogram was performed and dictated separately. IMPRESSION: Ultrasound-guided biopsy of the 2 masses within the right breast at the 2:30 o'clock axis (smaller mass) and 2 o'clock axis (larger mass) respectively. No apparent complications. Electronically Signed: By: Franki Cabot M.D. 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 This was found to be concordant by Dr. Franki Cabot. Pathology results were discussed with the patient by telephone. The patient reported doing well after the biopsies. Post biopsy instructions and care were reviewed and questions were answered. The patient was encouraged to call [facility] for any additional concerns. Surgical consultation has been arranged with Dr. Fanny Skates at [facility] on  April 15, 2015. Pathology results reported by Terie Purser, RN on 04/11/2015. Electronically Signed   By: Franki Cabot M.D.   On: 04/11/2015 10:46  04/11/2015  CLINICAL DATA:  Patient with 2 right breast masses presents today for ultrasound-guided biopsies. EXAM: ULTRASOUND GUIDED RIGHT BREAST CORE NEEDLE BIOPSY COMPARISON:  Previous exam(s). PROCEDURE: I met with the patient and we discussed the procedure of ultrasound-guided biopsy, including benefits and alternatives. We discussed the high likelihood of a successful procedure. We discussed the risks of the procedure including infection, bleeding, tissue injury, clip migration, and inadequate sampling.  Informed written consent was given. The usual time-out protocol was performed immediately prior to the procedure. Preprocedure ultrasound was performed revealing 2 masses which were described on recent diagnostic imaging. Today, the smaller mass is localized to the 2:30 o'clock axis, 7 cm from the nipple. Today, the larger mass is localized to 2 o'clock axis, 5 cm from nipple. Using sterile technique and 1% Lidocaine as local anesthetic, under direct ultrasound visualization, a 12 gauge spring-loaded device was used to perform biopsy of the smaller mass at the 2:30 o'clock axisusing an inferior approach. At the conclusion of the procedure, a ribbon shaped tissue marker clip was deployed into the biopsy cavity. Next, using sterile technique and 1% lidocaine as local anesthetic, under direct ultrasound visualization, a 12 gauge spring-loaded device was used to perform biopsy of the larger mass at the 2 o'clock axis using an inferior approach. At the conclusion of the procedure, a clip shaped coil was placed at the biopsy site. Follow-up 2-view mammogram was performed and dictated separately. IMPRESSION: Ultrasound-guided biopsy of the 2 masses within the right breast at the 2:30 o'clock axis (smaller mass) and 2 o'clock axis (larger mass) respectively. No apparent  complications. Electronically Signed: By: Franki Cabot M.D. On: 04/10/2015 16:18     PERTINENT LAB RESULTS: CBC:  Recent Labs  04/28/15 0326 04/29/15 0556  WBC 10.9* 9.8  HGB 9.8* 10.2*  HCT 31.1* 30.0*  PLT 130* 132*   CMET CMP     Component Value Date/Time   NA 133* 04/29/2015 0556   NA 139 04/24/2015 0818   K 3.6 04/29/2015 0556   K 3.8 04/24/2015 0818   CL 104 04/29/2015 0556   CO2 18* 04/29/2015 0556   CO2 24 04/24/2015 0818   GLUCOSE 132* 04/29/2015 0556   GLUCOSE 123 04/24/2015 0818   BUN 12 04/29/2015 0556   BUN 15.1 04/24/2015 0818   CREATININE 0.72 04/29/2015 0556   CREATININE 0.8 04/24/2015 0818   CALCIUM 8.6* 04/29/2015 0556   CALCIUM 9.5 04/24/2015 0818   PROT 7.3 04/26/2015 2141   PROT 6.7 04/24/2015 0818   ALBUMIN 3.5 04/26/2015 2141   ALBUMIN 3.7 04/24/2015 0818   AST 44* 04/26/2015 2141   AST 23 04/24/2015 0818   ALT 23 04/26/2015 2141   ALT 21 04/24/2015 0818   ALKPHOS 60 04/26/2015 2141   ALKPHOS 94 04/24/2015 0818   BILITOT 0.5 04/26/2015 2141   BILITOT 0.44 04/24/2015 0818   GFRNONAA >60 04/29/2015 0556   GFRAA >60 04/29/2015 0556    GFR Estimated Creatinine Clearance: 56 mL/min (by C-G formula based on Cr of 0.72). No results for input(s): LIPASE, AMYLASE in the last 72 hours. No results for input(s): CKTOTAL, CKMB, CKMBINDEX, TROPONINI in the last 72 hours. Invalid input(s): POCBNP No results for input(s): DDIMER in the last 72 hours. No results for input(s): HGBA1C in the last 72 hours. No results for input(s): CHOL, HDL, LDLCALC, TRIG, CHOLHDL, LDLDIRECT in the last 72 hours. No results for input(s): TSH, T4TOTAL, T3FREE, THYROIDAB in the last 72 hours.  Invalid input(s): FREET3 No results for input(s): VITAMINB12, FOLATE, FERRITIN, TIBC, IRON, RETICCTPCT in the last 72 hours. Coags: No results for input(s): INR in the last 72 hours.  Invalid input(s): PT Microbiology: Recent Results (from the past 240 hour(s))  Urine  culture     Status: None   Collection Time: 04/26/15  9:34 PM  Result Value Ref Range Status   Specimen Description URINE, CATHETERIZED  Final   Special Requests  NONE  Final   Culture >=100,000 COLONIES/mL KLEBSIELLA PNEUMONIAE  Final   Report Status 04/29/2015 FINAL  Final   Organism ID, Bacteria KLEBSIELLA PNEUMONIAE  Final      Susceptibility   Klebsiella pneumoniae - MIC*    AMPICILLIN >=32 RESISTANT Resistant     CEFAZOLIN <=4 SENSITIVE Sensitive     CEFTRIAXONE <=1 SENSITIVE Sensitive     CIPROFLOXACIN <=0.25 SENSITIVE Sensitive     GENTAMICIN <=1 SENSITIVE Sensitive     IMIPENEM <=0.25 SENSITIVE Sensitive     NITROFURANTOIN 32 SENSITIVE Sensitive     TRIMETH/SULFA <=20 SENSITIVE Sensitive     AMPICILLIN/SULBACTAM 4 SENSITIVE Sensitive     PIP/TAZO <=4 SENSITIVE Sensitive     * >=100,000 COLONIES/mL KLEBSIELLA PNEUMONIAE  Blood Culture (routine x 2)     Status: None (Preliminary result)   Collection Time: 04/26/15  9:41 PM  Result Value Ref Range Status   Specimen Description BLOOD LEFT ANTECUBITAL  Final   Special Requests BOTTLES DRAWN AEROBIC AND ANAEROBIC 4CC  Final   Culture NO GROWTH 3 DAYS  Final   Report Status PENDING  Incomplete  Blood Culture (routine x 2)     Status: None (Preliminary result)   Collection Time: 04/26/15  9:41 PM  Result Value Ref Range Status   Specimen Description BLOOD BLOOD LEFT FOREARM  Final   Special Requests BOTTLES DRAWN AEROBIC AND ANAEROBIC 5CC  Final   Culture NO GROWTH 3 DAYS  Final   Report Status PENDING  Incomplete     BRIEF [name] COURSE:  Sepsis secondary to UTI: Sepsis pathophysiology has resolved, suspect secondary to UTI, although UA not very convincing. Urine culture positive for pansensitive Escherichia coli, and narrow antibiotics down to Keflex. By day of discharge, much improved-she is afebrile without leukocytosis-she is requesting discharge. She will continue oral antibiotics for a few more days to complete 7 day  course. PCP will need to follow blood cultures till final-neg so far.   Active Problems: Hypokalemia: Repleted.  Chronic atrial fibrillation: Moderately Rate controlled, continue flecainide and Cardizem. Anticoagulated with Xarelto. She appears to have mild sinus tachycardia-spoke with Dr Paulo Fruit recommended checking a TSH. If TSH is suppressed-may need adjustment of her synthroid dose. TSH will be drawn prior to discharge, she will need to follow with Dr Einar Gip or her PCP.   Type 2 diabetes: CBGs stable with SSI while inpatient, will resume metformin on discharge.   History of lymphoma/acquired hypogammaglobulinemia: On maintenance Rituxan for lymphoma, for hypogammaglobulinemia, gets periodic IVIG at the cancer center.  Recently diagnosed right breast cancer: Initially was scheduled for mastectomy next week-given sepsis-this has been postponed mid-March.Patient is aware that she will need to stop Xarelto-3-4 days prior to her mastectomy and switch to Lovenox.   Hypothyroidism: Continue levothyroxine-see above-TSH is pending at time of discharge.   Dyslipidemia: Continue statin.  History of bronchial asthma: Lungs clear, as needed bronchodilators.  TODAY-DAY OF DISCHARGE:  Subjective:   Patty [name] today has no headache,no chest abdominal pain,no new weakness tingling or numbness, feels much better wants to go home today.   Objective:   Blood pressure 115/73, pulse 125, temperature 98.6 F (37 C), temperature source Oral, resp. rate 18, height '5\' 6"'  (1.676 m), weight 63.231 kg (139 lb 6.4 oz), SpO2 95 %.  Intake/Output Summary (Last 24 hours) at 04/30/15 0832 Last data filed at 04/30/15 0604  Gross per 24 hour  Intake   1002 ml  Output   1551 ml  Net   -  549 ml   Filed Weights   04/28/15 0731 04/29/15 0352 04/30/15 0601  Weight: 66.225 kg (146 lb) 63.957 kg (141 lb) 63.231 kg (139 lb 6.4 oz)    Exam Awake Alert, Oriented *3, No new F.N deficits, Normal  affect [place].AT,PERRAL Supple Neck,No JVD, No cervical lymphadenopathy appriciated.  Symmetrical Chest wall movement, Good air movement bilaterally, CTAB RRR,No Gallops,Rubs or new Murmurs, No Parasternal Heave +ve B.Sounds, Abd Soft, Non tender, No organomegaly appriciated, No rebound -guarding or rigidity. No Cyanosis, Clubbing or edema, No new Rash or bruise  DISCHARGE CONDITION: Stable  DISPOSITION: Home with home health services  DISCHARGE INSTRUCTIONS:    Activity:  As tolerated with Full fall precautions use walker/cane & assistance as needed  Get Medicines reviewed and adjusted: Please take all your medications with you for your next visit with your Primary MD  Please request your Primary MD to go over all [name] tests and procedure/radiological results at the follow up, please ask your Primary MD to get all [name] records sent to his/her office.  If you experience worsening of your admission symptoms, develop shortness of breath, life threatening emergency, suicidal or homicidal thoughts you must seek medical attention immediately by calling 911 or calling your MD immediately  if symptoms less severe.  You must read complete instructions/literature along with all the possible adverse reactions/side effects for all the Medicines you take and that have been prescribed to you. Take any new Medicines after you have completely understood and accpet all the possible adverse reactions/side effects.   Do not drive when taking Pain medications.   Do not take more than prescribed Pain, Sleep and Anxiety Medications  Special Instructions: If you have smoked or chewed Tobacco  in the last 2 yrs please stop smoking, stop any regular Alcohol  and or any Recreational drug use.  Wear Seat belts while driving.  Please note  You were cared for by a hospitalist during your [name] stay. Once you are discharged, your primary care physician will handle any further medical issues. Please  note that NO REFILLS for any discharge medications will be authorized once you are discharged, as it is imperative that you return to your primary care physician (or establish a relationship with a primary care physician if you do not have one) for your aftercare needs so that they can reassess your need for medications and monitor your lab values.   Diet recommendation: Heart Healthy diet  Discharge Instructions    Call MD for:  extreme fatigue    Complete by:  As directed      Call MD for:  severe uncontrolled pain    Complete by:  As directed      Call MD for:  temperature >100.4    Complete by:  As directed      Diet - low sodium heart healthy    Complete by:  As directed      Increase activity slowly    Complete by:  As directed            Follow-up Information    Follow up with Horatio Pel, MD. Schedule an appointment as soon as possible for a visit in 1 week.   Specialty:  Internal Medicine   Why:  [name] follow up   Contact information:   [place] [address] [phone]       Follow up with Adrian Prows, MD. Schedule an appointment as soon as possible for a visit in 1  week.   Specialty:  Cardiology   Why:  [name] follow up   Contact information:   [place] [address] [phone]      Total Time spent on discharge equals  45 minutes.  SignedOren Binet 04/30/2015 8:32 AM

## 2015-04-30 NOTE — Care Management Note (Signed)
Case Management Note  Patient Details  Name: Patty [name] MRN: [id number] Date of Birth: 1940/06/30  Subjective/Objective:  75 y.o. 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 Lives with spouse in private residence. PT recommending HHPT and pt has used [facility] in the past. Prefers to utilize them again.     Action/Plan: Referred to [facility] for HHPT and [place] aide. Butch Penny aware. No further CM needs at this time will be available should additional needs arise.    Expected Discharge Date:                  Expected Discharge Plan:  [place]  In-House Referral:     Discharge planning Services  CM Consult  Post Acute Care Choice:    Choice offered to:  Patient, Spouse  DME Arranged:    DME Agency:     HH Arranged:  PT, Nurse's Aide [place] Agency:  [place] (Has used [facility] in Past)  Status of Service:  Completed, signed off  Medicare Important Message Given:  Yes Date Medicare IM Given:    Medicare IM give by:    Date Additional Medicare IM Given:    Additional Medicare Important Message give by:     If discussed at [place] of Stay Meetings, dates discussed:    Additional Comments:  Delrae Sawyers, RN 04/30/2015, 10:13 AM

## 2015-04-30 NOTE — Progress Notes (Signed)
Pt refused NIV for the night. Pt stated that she just wanted to rest w/o it. Told pt if changes her mind to let me know. Pt is stable at this time resting comfortably.

## 2015-04-30 NOTE — Progress Notes (Signed)
Husband at the bedside, IV and Tele removed, pt given discharge instructions, verbalized understanding questions answered. Case management to set up [facility] needs. Taken pt taken out via wheelchair.

## 2015-04-30 NOTE — Progress Notes (Signed)
General Surgery:  Right Mastectomy rescheduled for March 28. My office will contact post discharge to coordinate.   Edsel Petrin. Dalbert Batman, M.D., [facility] General and Minimally invasive Surgery Breast and Colorectal Surgery Office:   [phone] Pager:   [phone]

## 2015-05-01 DIAGNOSIS — C770 Secondary and unspecified malignant neoplasm of lymph nodes of head, face and neck: Secondary | ICD-10-CM | POA: Diagnosis not present

## 2015-05-01 DIAGNOSIS — D801 Nonfamilial hypogammaglobulinemia: Secondary | ICD-10-CM | POA: Diagnosis not present

## 2015-05-01 DIAGNOSIS — A419 Sepsis, unspecified organism: Secondary | ICD-10-CM | POA: Diagnosis not present

## 2015-05-01 DIAGNOSIS — N189 Chronic kidney disease, unspecified: Secondary | ICD-10-CM | POA: Diagnosis not present

## 2015-05-01 DIAGNOSIS — I482 Chronic atrial fibrillation: Secondary | ICD-10-CM | POA: Diagnosis not present

## 2015-05-01 DIAGNOSIS — E785 Hyperlipidemia, unspecified: Secondary | ICD-10-CM | POA: Diagnosis not present

## 2015-05-01 DIAGNOSIS — E039 Hypothyroidism, unspecified: Secondary | ICD-10-CM | POA: Diagnosis not present

## 2015-05-01 DIAGNOSIS — C50911 Malignant neoplasm of unspecified site of right female breast: Secondary | ICD-10-CM | POA: Diagnosis not present

## 2015-05-01 DIAGNOSIS — N390 Urinary tract infection, site not specified: Secondary | ICD-10-CM | POA: Diagnosis not present

## 2015-05-01 DIAGNOSIS — E1122 Type 2 diabetes mellitus with diabetic chronic kidney disease: Secondary | ICD-10-CM | POA: Diagnosis not present

## 2015-05-01 LAB — CULTURE, BLOOD (ROUTINE X 2)
Culture: NO GROWTH
Culture: NO GROWTH

## 2015-05-02 DIAGNOSIS — A419 Sepsis, unspecified organism: Secondary | ICD-10-CM | POA: Diagnosis not present

## 2015-05-02 DIAGNOSIS — N390 Urinary tract infection, site not specified: Secondary | ICD-10-CM | POA: Diagnosis not present

## 2015-05-02 DIAGNOSIS — R197 Diarrhea, unspecified: Secondary | ICD-10-CM | POA: Diagnosis not present

## 2015-05-02 DIAGNOSIS — E876 Hypokalemia: Secondary | ICD-10-CM | POA: Diagnosis not present

## 2015-05-05 DIAGNOSIS — C50911 Malignant neoplasm of unspecified site of right female breast: Secondary | ICD-10-CM | POA: Diagnosis not present

## 2015-05-05 DIAGNOSIS — C770 Secondary and unspecified malignant neoplasm of lymph nodes of head, face and neck: Secondary | ICD-10-CM | POA: Diagnosis not present

## 2015-05-05 DIAGNOSIS — D801 Nonfamilial hypogammaglobulinemia: Secondary | ICD-10-CM | POA: Diagnosis not present

## 2015-05-05 DIAGNOSIS — E1122 Type 2 diabetes mellitus with diabetic chronic kidney disease: Secondary | ICD-10-CM | POA: Diagnosis not present

## 2015-05-05 DIAGNOSIS — N390 Urinary tract infection, site not specified: Secondary | ICD-10-CM | POA: Diagnosis not present

## 2015-05-05 DIAGNOSIS — A419 Sepsis, unspecified organism: Secondary | ICD-10-CM | POA: Diagnosis not present

## 2015-05-06 DIAGNOSIS — I480 Paroxysmal atrial fibrillation: Secondary | ICD-10-CM | POA: Diagnosis not present

## 2015-05-06 DIAGNOSIS — Z7901 Long term (current) use of anticoagulants: Secondary | ICD-10-CM | POA: Diagnosis not present

## 2015-05-06 DIAGNOSIS — I10 Essential (primary) hypertension: Secondary | ICD-10-CM | POA: Diagnosis not present

## 2015-05-07 DIAGNOSIS — R300 Dysuria: Secondary | ICD-10-CM | POA: Diagnosis not present

## 2015-05-07 DIAGNOSIS — I4891 Unspecified atrial fibrillation: Secondary | ICD-10-CM | POA: Diagnosis not present

## 2015-05-07 DIAGNOSIS — Z8619 Personal history of other infectious and parasitic diseases: Secondary | ICD-10-CM | POA: Diagnosis not present

## 2015-05-07 DIAGNOSIS — N390 Urinary tract infection, site not specified: Secondary | ICD-10-CM | POA: Diagnosis not present

## 2015-05-08 DIAGNOSIS — N390 Urinary tract infection, site not specified: Secondary | ICD-10-CM | POA: Diagnosis not present

## 2015-05-08 DIAGNOSIS — C770 Secondary and unspecified malignant neoplasm of lymph nodes of head, face and neck: Secondary | ICD-10-CM | POA: Diagnosis not present

## 2015-05-08 DIAGNOSIS — D801 Nonfamilial hypogammaglobulinemia: Secondary | ICD-10-CM | POA: Diagnosis not present

## 2015-05-08 DIAGNOSIS — E1122 Type 2 diabetes mellitus with diabetic chronic kidney disease: Secondary | ICD-10-CM | POA: Diagnosis not present

## 2015-05-08 DIAGNOSIS — A419 Sepsis, unspecified organism: Secondary | ICD-10-CM | POA: Diagnosis not present

## 2015-05-08 DIAGNOSIS — C50911 Malignant neoplasm of unspecified site of right female breast: Secondary | ICD-10-CM | POA: Diagnosis not present

## 2015-05-12 DIAGNOSIS — D801 Nonfamilial hypogammaglobulinemia: Secondary | ICD-10-CM | POA: Diagnosis not present

## 2015-05-12 DIAGNOSIS — C50911 Malignant neoplasm of unspecified site of right female breast: Secondary | ICD-10-CM | POA: Diagnosis not present

## 2015-05-12 DIAGNOSIS — N390 Urinary tract infection, site not specified: Secondary | ICD-10-CM | POA: Diagnosis not present

## 2015-05-12 DIAGNOSIS — E1122 Type 2 diabetes mellitus with diabetic chronic kidney disease: Secondary | ICD-10-CM | POA: Diagnosis not present

## 2015-05-12 DIAGNOSIS — A419 Sepsis, unspecified organism: Secondary | ICD-10-CM | POA: Diagnosis not present

## 2015-05-12 DIAGNOSIS — C770 Secondary and unspecified malignant neoplasm of lymph nodes of head, face and neck: Secondary | ICD-10-CM | POA: Diagnosis not present

## 2015-05-13 DIAGNOSIS — C770 Secondary and unspecified malignant neoplasm of lymph nodes of head, face and neck: Secondary | ICD-10-CM | POA: Diagnosis not present

## 2015-05-13 DIAGNOSIS — D801 Nonfamilial hypogammaglobulinemia: Secondary | ICD-10-CM | POA: Diagnosis not present

## 2015-05-13 DIAGNOSIS — E1122 Type 2 diabetes mellitus with diabetic chronic kidney disease: Secondary | ICD-10-CM | POA: Diagnosis not present

## 2015-05-13 DIAGNOSIS — A419 Sepsis, unspecified organism: Secondary | ICD-10-CM | POA: Diagnosis not present

## 2015-05-13 DIAGNOSIS — N390 Urinary tract infection, site not specified: Secondary | ICD-10-CM | POA: Diagnosis not present

## 2015-05-13 DIAGNOSIS — C50911 Malignant neoplasm of unspecified site of right female breast: Secondary | ICD-10-CM | POA: Diagnosis not present

## 2015-05-14 ENCOUNTER — Other Ambulatory Visit (HOSPITAL_BASED_OUTPATIENT_CLINIC_OR_DEPARTMENT_OTHER): Payer: Medicare Other

## 2015-05-14 ENCOUNTER — Ambulatory Visit (HOSPITAL_BASED_OUTPATIENT_CLINIC_OR_DEPARTMENT_OTHER): Payer: Medicare Other | Admitting: Hematology and Oncology

## 2015-05-14 ENCOUNTER — Ambulatory Visit: Payer: Medicare Other

## 2015-05-14 ENCOUNTER — Encounter: Payer: Self-pay | Admitting: Hematology and Oncology

## 2015-05-14 ENCOUNTER — Ambulatory Visit (HOSPITAL_BASED_OUTPATIENT_CLINIC_OR_DEPARTMENT_OTHER): Payer: Medicare Other

## 2015-05-14 VITALS — BP 108/56 | HR 87 | Temp 97.6°F | Resp 18

## 2015-05-14 VITALS — BP 103/56 | HR 97 | Temp 98.5°F | Resp 17 | Wt 142.5 lb

## 2015-05-14 DIAGNOSIS — C50211 Malignant neoplasm of upper-inner quadrant of right female breast: Secondary | ICD-10-CM | POA: Diagnosis not present

## 2015-05-14 DIAGNOSIS — R197 Diarrhea, unspecified: Secondary | ICD-10-CM | POA: Diagnosis not present

## 2015-05-14 DIAGNOSIS — D801 Nonfamilial hypogammaglobulinemia: Secondary | ICD-10-CM | POA: Diagnosis not present

## 2015-05-14 DIAGNOSIS — C8591 Non-Hodgkin lymphoma, unspecified, lymph nodes of head, face, and neck: Secondary | ICD-10-CM

## 2015-05-14 DIAGNOSIS — Z95828 Presence of other vascular implants and grafts: Secondary | ICD-10-CM

## 2015-05-14 LAB — COMPREHENSIVE METABOLIC PANEL
ALBUMIN: 3.2 g/dL — AB (ref 3.5–5.0)
ALK PHOS: 81 U/L (ref 40–150)
ALT: 20 U/L (ref 0–55)
AST: 23 U/L (ref 5–34)
Anion Gap: 8 mEq/L (ref 3–11)
BUN: 13.4 mg/dL (ref 7.0–26.0)
CO2: 24 meq/L (ref 22–29)
Calcium: 8.9 mg/dL (ref 8.4–10.4)
Chloride: 106 mEq/L (ref 98–109)
Creatinine: 0.8 mg/dL (ref 0.6–1.1)
EGFR: 75 mL/min/{1.73_m2} — ABNORMAL LOW (ref >90)
GLUCOSE: 116 mg/dL (ref 70–140)
POTASSIUM: 4.1 meq/L (ref 3.5–5.1)
SODIUM: 139 meq/L (ref 136–145)
TOTAL PROTEIN: 6.4 g/dL (ref 6.4–8.3)
Total Bilirubin: 0.35 mg/dL (ref 0.20–1.20)

## 2015-05-14 LAB — CBC WITH DIFFERENTIAL/PLATELET
BASO%: 0.5 % (ref 0.0–2.0)
BASOS ABS: 0 10*3/uL (ref 0.0–0.1)
EOS ABS: 0 10*3/uL (ref 0.0–0.5)
EOS%: 0.5 % (ref 0.0–7.0)
HCT: 29.5 % — ABNORMAL LOW (ref 34.8–46.6)
HEMOGLOBIN: 9.4 g/dL — AB (ref 11.6–15.9)
LYMPH%: 13.4 % — AB (ref 14.0–49.7)
MCH: 27.1 pg (ref 25.1–34.0)
MCHC: 32 g/dL (ref 31.5–36.0)
MCV: 84.7 fL (ref 79.5–101.0)
MONO#: 0.8 10*3/uL (ref 0.1–0.9)
MONO%: 12.5 % (ref 0.0–14.0)
NEUT#: 4.6 10*3/uL (ref 1.5–6.5)
NEUT%: 73.1 % (ref 38.4–76.8)
PLATELETS: 199 10*3/uL (ref 145–400)
RBC: 3.48 10*6/uL — AB (ref 3.70–5.45)
RDW: 16.9 % — ABNORMAL HIGH (ref 11.2–14.5)
WBC: 6.3 10*3/uL (ref 3.9–10.3)
lymph#: 0.8 10*3/uL — ABNORMAL LOW (ref 0.9–3.3)

## 2015-05-14 MED ORDER — HEPARIN SOD (PORK) LOCK FLUSH 100 UNIT/ML IV SOLN
500.0000 [IU] | Freq: Once | INTRAVENOUS | Status: AC
  Administered 2015-05-14: 500 [IU] via INTRAVENOUS
  Filled 2015-05-14: qty 5

## 2015-05-14 MED ORDER — DEXAMETHASONE SODIUM PHOSPHATE 100 MG/10ML IJ SOLN
10.0000 mg | Freq: Once | INTRAMUSCULAR | Status: AC
  Administered 2015-05-14: 10 mg via INTRAVENOUS
  Filled 2015-05-14: qty 1

## 2015-05-14 MED ORDER — SODIUM CHLORIDE 0.9% FLUSH
10.0000 mL | INTRAVENOUS | Status: DC | PRN
  Administered 2015-05-14: 10 mL via INTRAVENOUS
  Filled 2015-05-14: qty 10

## 2015-05-14 MED ORDER — DIPHENHYDRAMINE HCL 25 MG PO CAPS
25.0000 mg | ORAL_CAPSULE | Freq: Once | ORAL | Status: AC
  Administered 2015-05-14: 25 mg via ORAL

## 2015-05-14 MED ORDER — IMMUNE GLOBULIN (HUMAN) 10 GM/100ML IV SOLN
400.0000 mg/kg | Freq: Once | INTRAVENOUS | Status: AC
  Administered 2015-05-14: 25 g via INTRAVENOUS
  Filled 2015-05-14: qty 200

## 2015-05-14 MED ORDER — SODIUM CHLORIDE 0.9 % IV SOLN
Freq: Once | INTRAVENOUS | Status: AC
  Administered 2015-05-14: 10:00:00 via INTRAVENOUS

## 2015-05-14 MED ORDER — DIPHENHYDRAMINE HCL 25 MG PO CAPS
ORAL_CAPSULE | ORAL | Status: AC
  Filled 2015-05-14: qty 1

## 2015-05-14 NOTE — Progress Notes (Signed)
Per Dr. Alvy Bimler Pt to receive IVIG only today.  Pt monitored 30 minutes post infusion. Pt and VS stable at time of discharge.

## 2015-05-14 NOTE — Assessment & Plan Note (Signed)
Her mastectomy surgery was recently cancelled due to sepsis. Currently is scheduled to be done in 2 weeks

## 2015-05-14 NOTE — Assessment & Plan Note (Signed)
The patient had recurrent admission to the hospital with sepsis, due to recurrent UTI. She is currently on prophylactic antibiotic therapy with Keflex I discussed the pathophysiology of hypogammaglobulinemia and I recommend we discontinue rituximab which is currently given as a maintenance treatment Her recent CT scan from December 2016 show no evidence of recurrence of lymphoma

## 2015-05-14 NOTE — Patient Instructions (Signed)

## 2015-05-14 NOTE — Assessment & Plan Note (Signed)
This is due to rituximab. She also had mild infusion reaction with prior IVIG. I plan to reduce the dose to 400 mg/kg 1 dose today along with premedication. After today's infusion, I will stop and recheck immunoglobulin levels next month and to determine whether she will need further treatment

## 2015-05-14 NOTE — Progress Notes (Signed)
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   01/03/2014 Bone Marrow Biopsy Accession: [id number] Bone marrow biopsy is negative   01/04/2014 Imaging ECHO showed normal EF   01/04/2014 Procedure She has placement of port   01/09/2014 - 03/07/2014 Chemotherapy She was given treatment with bendamustine with rituximab. Treatment was stopped due to severe side-effects despite significant dose adjustment for cycle 2   01/18/2014 - 02/01/2014 Hospital Admission She was admitted to the hospital from Escherichia coli sepsis with multiorgan failure and brief episodes of intubation. She was discharged to skilled nursing facility   02/26/2014 Imaging PET/CT scan showed near complete remission   02/27/2014 Adverse Reaction Cycle 2 of treatment was resumed with drastic dose adjustment to bendamustine due to recent multi-organ failure   04/03/2014 - 03/13/2015 Chemotherapy She is started on maintenance rituximab only.   04/09/2014 - 04/12/2014 Hospital Admission The patient was admitted to the hospital with urinary tract  infection and sepsis.   06/05/2014 Imaging PET CT scan showed complete response to Rx   02/12/2015 Imaging Ct scan showed no evidence of disease. 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 She returns today for further treatment. She was readmitted to the hospital recently for another episode of sepsis secondary to urinary tract infection. She is currently on Keflex. She complained of fatigue and mild weakness. Denies fevers or chills. She has intermittent diarrhea.  REVIEW OF SYSTEMS:   Constitutional: Denies fevers, chills or abnormal weight loss Eyes: Denies blurriness of vision Ears, nose, mouth, throat, and face: Denies mucositis or sore throat Respiratory: Denies cough, dyspnea or wheezes Cardiovascular: Denies palpitation, chest discomfort or lower extremity swelling Skin: Denies abnormal skin rashes Lymphatics: Denies new lymphadenopathy or easy bruising Neurological:Denies numbness, tingling or new weaknesses Behavioral/Psych: Mood is stable, no new changes  All other systems were reviewed with the patient and are negative.  I have reviewed the past medical history, past surgical history, social history and family history with the patient and they are  unchanged from previous note.  ALLERGIES:  is allergic to morphine and related; multaq; demerol; oysters;  penicillins; and sulfa drugs cross reactors.  MEDICATIONS:  Current Outpatient Prescriptions  Medication Sig Dispense Refill  . acetaminophen (TYLENOL) 500 MG tablet Take 1,000 mg by mouth every 6 (six) hours as needed for moderate pain, fever or headache.    . albuterol (PROAIR HFA) 108 (90 BASE) MCG/ACT inhaler Inhale 2 puffs into the lungs every 6 (six) hours as needed for wheezing or shortness of breath.    Marland Kitchen albuterol (PROVENTIL) (2.5 MG/3ML) 0.083% nebulizer solution Inhale 3 mLs into the lungs every 6 (six) hours as needed for wheezing or shortness of breath.     . cephALEXin (KEFLEX) 500 MG capsule Take 1 capsule (500 mg total) by mouth 4 (four) times daily. 16 capsule 0  . Cholecalciferol (VITAMIN D) 2000 UNITS tablet Take 2,000 Units by mouth daily with lunch.    . diltiazem (CARDIZEM CD) 120 MG 24 hr capsule Take 120 mg by mouth daily.     . diphenoxylate-atropine (LOMOTIL) 2.5-0.025 MG per tablet Take 1 tablet by mouth 4 (four) times daily as needed for diarrhea or loose stools. 90 tablet 0  . enoxaparin (LOVENOX) 60 MG/0.6ML injection Inject 0.6 mLs (60 mg total) into the skin every 12 (twelve) hours. STOP XARELTO 3-4 DAYS PRIOR TO YOUR MASTECTOMY- THEN USE LOVENOX. (THIS IS PRIOR TO YOUR BREAST SURGERY/MASTECTOMY-PLEASE START THIS ONLY AFTER SPEAKING WITH YOUR SURGEON). 0 Syringe 0  . flecainide (TAMBOCOR) 100 MG tablet Take 50 mg by mouth 2 (two) times daily.    . Fluticasone-Salmeterol (ADVAIR) 250-50 MCG/DOSE AEPB Inhale 2 puffs into the lungs every 12 (twelve) hours.     Marland Kitchen levothyroxine (SYNTHROID, LEVOTHROID) 75 MCG tablet Take 225 mcg by mouth daily before breakfast.    . lidocaine-prilocaine (EMLA) cream Apply 1 application topically as needed. Apply to [facility] a Cath site at least one hour prior to needle stick as needed. 30 g 3  . Magnesium Oxide 500 MG TABS Take 500 mg by mouth daily.     . metFORMIN (GLUCOPHAGE-XR) 500 MG 24 hr tablet Take 500 mg by mouth 2 (two) times daily  with a meal.    . montelukast (SINGULAIR) 10 MG tablet Take 10 mg by mouth daily.    . ondansetron (ZOFRAN) 8 MG tablet Take 8 mg by mouth every 8 (eight) hours as needed for nausea or vomiting.    . pravastatin (PRAVACHOL) 40 MG tablet Take 20 mg by mouth See admin instructions. Take 1/2 tablet (20 mg) ONLY on Monday, Tuesday, Wednesday, and Thursday Nights.    Marland Kitchen PRESCRIPTION MEDICATION Antibody Plan [facility]    . prochlorperazine (COMPAZINE) 10 MG tablet Take 1 tablet (10 mg total) by mouth every 6 (six) hours as needed for nausea (nausea). 30 tablet 6  . rivaroxaban (XARELTO) 20 MG TABS tablet Take 20 mg by mouth daily with supper.    . traMADol-acetaminophen (ULTRACET) 37.5-325 MG per tablet Take 1 tablet by mouth every 6 (six) hours as needed. (Patient taking differently: Take 1 tablet by mouth every 6 (six) hours as needed for moderate pain. ) 30 tablet 0  . vitamin B-12 (CYANOCOBALAMIN) 500 MCG tablet Take 500 mcg by mouth daily.     No current facility-administered medications for this visit.    PHYSICAL EXAMINATION: ECOG PERFORMANCE STATUS: 1 - Symptomatic but completely ambulatory  Filed Vitals:   05/14/15 0828 05/14/15 0829  BP: 78/54 103/56  Pulse: 97  Temp: 98.5 F (36.9 C)   Resp: 17    Filed Weights   05/14/15 0828  Weight: 142 lb 8 oz (64.638 kg)    GENERAL:alert, no distress and comfortable SKIN: skin color, texture, turgor are normal, no rashes or significant lesions EYES: normal, Conjunctiva are pink and non-injected, sclera clear Musculoskeletal:no cyanosis of digits and no clubbing  NEURO: alert & oriented x 3 with fluent speech, no focal motor/sensory deficits  LABORATORY DATA:  I have reviewed the data as listed    Component Value Date/Time   NA 139 05/14/2015 0737   NA 133* 04/29/2015 0556   K 4.1 05/14/2015 0737   K 3.6 04/29/2015 0556   CL 104 04/29/2015 0556   CO2 24 05/14/2015 0737   CO2 18* 04/29/2015 0556   GLUCOSE 116 05/14/2015 0737    GLUCOSE 132* 04/29/2015 0556   BUN 13.4 05/14/2015 0737   BUN 12 04/29/2015 0556   CREATININE 0.8 05/14/2015 0737   CREATININE 0.72 04/29/2015 0556   CALCIUM 8.9 05/14/2015 0737   CALCIUM 8.6* 04/29/2015 0556   PROT 6.4 05/14/2015 0737   PROT 7.3 04/26/2015 2141   ALBUMIN 3.2* 05/14/2015 0737   ALBUMIN 3.5 04/26/2015 2141   AST 23 05/14/2015 0737   AST 44* 04/26/2015 2141   ALT 20 05/14/2015 0737   ALT 23 04/26/2015 2141   ALKPHOS 81 05/14/2015 0737   ALKPHOS 60 04/26/2015 2141   BILITOT 0.35 05/14/2015 0737   BILITOT 0.5 04/26/2015 2141   GFRNONAA >60 04/29/2015 0556   GFRAA >60 04/29/2015 0556    No results found for: SPEP, UPEP  Lab Results  Component Value Date   WBC 6.3 05/14/2015   NEUTROABS 4.6 05/14/2015   HGB 9.4* 05/14/2015   HCT 29.5* 05/14/2015   MCV 84.7 05/14/2015   PLT 199 05/14/2015      Chemistry      Component Value Date/Time   NA 139 05/14/2015 0737   NA 133* 04/29/2015 0556   K 4.1 05/14/2015 0737   K 3.6 04/29/2015 0556   CL 104 04/29/2015 0556   CO2 24 05/14/2015 0737   CO2 18* 04/29/2015 0556   BUN 13.4 05/14/2015 0737   BUN 12 04/29/2015 0556   CREATININE 0.8 05/14/2015 0737   CREATININE 0.72 04/29/2015 0556      Component Value Date/Time   CALCIUM 8.9 05/14/2015 0737   CALCIUM 8.6* 04/29/2015 0556   ALKPHOS 81 05/14/2015 0737   ALKPHOS 60 04/26/2015 2141   AST 23 05/14/2015 0737   AST 44* 04/26/2015 2141   ALT 20 05/14/2015 0737   ALT 23 04/26/2015 2141   BILITOT 0.35 05/14/2015 0737   BILITOT 0.5 04/26/2015 2141     ASSESSMENT & PLAN:  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 She is currently on prophylactic antibiotic therapy with Keflex I discussed the pathophysiology of hypogammaglobulinemia and I recommend we discontinue rituximab which is currently given as a maintenance treatment Her recent CT scan from  December 2016 show no evidence of recurrence of lymphoma  Breast cancer of upper-inner quadrant of right female breast [facility]) Her mastectomy surgery was recently cancelled due to sepsis. 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 She also had mild infusion reaction with prior IVIG. I plan to reduce the dose to 400 mg/kg 1 dose today along with  premedication. After today's infusion, I will stop and recheck immunoglobulin levels next month and to determine whether she will need further treatment   Orders Placed This Encounter  Procedures  . IgG    Standing Status: Future     Number of Occurrences:      Standing Expiration Date: 05/13/2016   All questions were answered. The patient knows to call the clinic with any problems, questions or concerns. No barriers to learning was detected. I spent 20 minutes counseling the patient face to face. The total time spent in the appointment was 20 minutes and more than 50% was on counseling and review of test results     [facility], Loriana Samad, MD 05/14/2015 9:11 AM

## 2015-05-14 NOTE — Patient Instructions (Signed)

## 2015-05-15 DIAGNOSIS — C770 Secondary and unspecified malignant neoplasm of lymph nodes of head, face and neck: Secondary | ICD-10-CM | POA: Diagnosis not present

## 2015-05-15 DIAGNOSIS — E1122 Type 2 diabetes mellitus with diabetic chronic kidney disease: Secondary | ICD-10-CM | POA: Diagnosis not present

## 2015-05-15 DIAGNOSIS — D801 Nonfamilial hypogammaglobulinemia: Secondary | ICD-10-CM | POA: Diagnosis not present

## 2015-05-15 DIAGNOSIS — N390 Urinary tract infection, site not specified: Secondary | ICD-10-CM | POA: Diagnosis not present

## 2015-05-15 DIAGNOSIS — C50911 Malignant neoplasm of unspecified site of right female breast: Secondary | ICD-10-CM | POA: Diagnosis not present

## 2015-05-15 DIAGNOSIS — A419 Sepsis, unspecified organism: Secondary | ICD-10-CM | POA: Diagnosis not present

## 2015-05-16 DIAGNOSIS — I480 Paroxysmal atrial fibrillation: Secondary | ICD-10-CM | POA: Diagnosis not present

## 2015-05-16 DIAGNOSIS — Z7901 Long term (current) use of anticoagulants: Secondary | ICD-10-CM | POA: Diagnosis not present

## 2015-05-16 DIAGNOSIS — I10 Essential (primary) hypertension: Secondary | ICD-10-CM | POA: Diagnosis not present

## 2015-05-21 DIAGNOSIS — D801 Nonfamilial hypogammaglobulinemia: Secondary | ICD-10-CM | POA: Diagnosis not present

## 2015-05-21 DIAGNOSIS — C770 Secondary and unspecified malignant neoplasm of lymph nodes of head, face and neck: Secondary | ICD-10-CM | POA: Diagnosis not present

## 2015-05-21 DIAGNOSIS — C50911 Malignant neoplasm of unspecified site of right female breast: Secondary | ICD-10-CM | POA: Diagnosis not present

## 2015-05-21 DIAGNOSIS — N390 Urinary tract infection, site not specified: Secondary | ICD-10-CM | POA: Diagnosis not present

## 2015-05-21 DIAGNOSIS — A419 Sepsis, unspecified organism: Secondary | ICD-10-CM | POA: Diagnosis not present

## 2015-05-21 DIAGNOSIS — R358 Other polyuria: Secondary | ICD-10-CM | POA: Diagnosis not present

## 2015-05-21 DIAGNOSIS — R300 Dysuria: Secondary | ICD-10-CM | POA: Diagnosis not present

## 2015-05-21 DIAGNOSIS — E1122 Type 2 diabetes mellitus with diabetic chronic kidney disease: Secondary | ICD-10-CM | POA: Diagnosis not present

## 2015-05-21 NOTE — Patient Instructions (Addendum)
YOUR PROCEDURE IS SCHEDULED ON :  05/27/15  REPORT TO [facility] MAIN ENTRANCE FOLLOW SIGNS TO EAST ELEVATOR - GO TO 3rd FLOOR CHECK IN AT 3 EAST NURSES STATION (SHORT STAY) AT:  7:00 AM  CALL THIS NUMBER IF YOU HAVE PROBLEMS THE MORNING OF SURGERY [phone]  REMEMBER:ONLY 1 PER PERSON MAY GO TO SHORT STAY WITH YOU TO GET READY THE MORNING OF YOUR SURGERY  DO NOT EAT FOOD OR DRINK LIQUIDS AFTER MIDNIGHT  TAKE THESE MEDICINES THE MORNING OF SURGERY: CARDIZEM / FLECAINIDE / SYNTHROID / SINGULAIR  STOP ASPIRIN / IBUPROFEN / ALEVE / VITAMINS / HERBAL MEDS __5__ DAYS BEFORE SURGERY  BRING C-PAP TUBING AND MASK TO HOSPITAL  YOU MAY NOT HAVE ANY METAL ON YOUR BODY INCLUDING HAIR PINS AND PIERCING'S. DO NOT WEAR JEWELRY, MAKEUP, LOTIONS, POWDERS OR PERFUMES. DO NOT WEAR NAIL POLISH. DO NOT SHAVE 48 HRS PRIOR TO SURGERY. MEN MAY SHAVE FACE AND NECK.  DO NOT [place]. [facility] IS NOT RESPONSIBLE FOR VALUABLES.  CONTACTS, DENTURES OR PARTIALS MAY NOT BE WORN TO SURGERY. LEAVE SUITCASE IN CAR. CAN BE BROUGHT TO ROOM AFTER SURGERY.  PATIENTS DISCHARGED THE DAY OF SURGERY WILL NOT BE ALLOWED TO DRIVE HOME.  PLEASE READ OVER THE FOLLOWING INSTRUCTION SHEETS _________________________________________________________________________________                                          [place] - PREPARING FOR SURGERY  Before surgery, you can play an important role.  Because skin is not sterile, your skin needs to be as free of germs as possible.  You can reduce the number of germs on your skin by washing with CHG (chlorahexidine gluconate) soap before surgery.  CHG is an antiseptic cleaner which kills germs and bonds with the skin to continue killing germs even after washing. Please DO NOT use if you have an allergy to CHG or antibacterial soaps.  If your skin becomes reddened/irritated stop using the CHG and inform your nurse when you arrive at Short  Stay. Do not shave (including legs and underarms) for at least 48 hours prior to the first CHG shower.  You may shave your face. Please follow these instructions carefully:   1.  Shower with CHG Soap the night before surgery and the  morning of Surgery.   2.  If you choose to wash your hair, wash your hair first as usual with your  normal  Shampoo.   3.  After you shampoo, rinse your hair and body thoroughly to remove the  shampoo.                                         4.  Use CHG as you would any other liquid soap.  You can apply chg directly  to the skin and wash . Gently wash with scrungie or clean wascloth    5.  Apply the CHG Soap to your body ONLY FROM THE NECK DOWN.   Do not use on open                           Wound or open sores. Avoid contact with eyes, ears mouth and genitals (private parts).  Genitals (private parts) with your normal soap.              6.  Wash thoroughly, paying special attention to the area where your surgery  will be performed.   7.  Thoroughly rinse your body with warm water from the neck down.   8.  DO NOT shower/wash with your normal soap after using and rinsing off  the CHG Soap .                9.  Pat yourself dry with a clean towel.             10.  Wear clean night clothes to bed after shower             11.  Place clean sheets on your bed the night of your first shower and do not  sleep with pets.  Day of Surgery : Do not apply any lotions/deodorants the morning of surgery.  Please wear clean clothes to the hospital/surgery center.  FAILURE TO FOLLOW THESE INSTRUCTIONS MAY RESULT IN THE CANCELLATION OF YOUR SURGERY    PATIENT SIGNATURE_________________________________  ______________________________________________________________________

## 2015-05-23 ENCOUNTER — Encounter (HOSPITAL_COMMUNITY): Payer: Self-pay

## 2015-05-23 ENCOUNTER — Encounter (HOSPITAL_COMMUNITY)
Admission: RE | Admit: 2015-05-23 | Discharge: 2015-05-23 | Disposition: A | Discharge status: Home / Self Care | Payer: Medicare Other | Source: Ambulatory Visit | Attending: General Surgery | Admitting: General Surgery

## 2015-05-23 DIAGNOSIS — I4891 Unspecified atrial fibrillation: Secondary | ICD-10-CM | POA: Insufficient documentation

## 2015-05-23 DIAGNOSIS — Z882 Allergy status to sulfonamides status: Secondary | ICD-10-CM | POA: Insufficient documentation

## 2015-05-23 DIAGNOSIS — J45909 Unspecified asthma, uncomplicated: Secondary | ICD-10-CM | POA: Diagnosis not present

## 2015-05-23 DIAGNOSIS — N390 Urinary tract infection, site not specified: Secondary | ICD-10-CM | POA: Diagnosis not present

## 2015-05-23 DIAGNOSIS — Z7984 Long term (current) use of oral hypoglycemic drugs: Secondary | ICD-10-CM | POA: Insufficient documentation

## 2015-05-23 DIAGNOSIS — C8591 Non-Hodgkin lymphoma, unspecified, lymph nodes of head, face, and neck: Secondary | ICD-10-CM | POA: Diagnosis not present

## 2015-05-23 DIAGNOSIS — Z7901 Long term (current) use of anticoagulants: Secondary | ICD-10-CM | POA: Insufficient documentation

## 2015-05-23 DIAGNOSIS — Z01812 Encounter for preprocedural laboratory examination: Secondary | ICD-10-CM | POA: Insufficient documentation

## 2015-05-23 DIAGNOSIS — Z880 Allergy status to penicillin: Secondary | ICD-10-CM | POA: Diagnosis not present

## 2015-05-23 DIAGNOSIS — E119 Type 2 diabetes mellitus without complications: Secondary | ICD-10-CM | POA: Insufficient documentation

## 2015-05-23 DIAGNOSIS — Z0000 Encounter for general adult medical examination without abnormal findings: Secondary | ICD-10-CM | POA: Diagnosis not present

## 2015-05-23 DIAGNOSIS — Z79899 Other long term (current) drug therapy: Secondary | ICD-10-CM | POA: Diagnosis not present

## 2015-05-23 DIAGNOSIS — Z885 Allergy status to narcotic agent status: Secondary | ICD-10-CM | POA: Diagnosis not present

## 2015-05-23 DIAGNOSIS — C50311 Malignant neoplasm of lower-inner quadrant of right female breast: Secondary | ICD-10-CM | POA: Insufficient documentation

## 2015-05-23 DIAGNOSIS — Z87440 Personal history of urinary (tract) infections: Secondary | ICD-10-CM | POA: Diagnosis not present

## 2015-05-23 HISTORY — DX: Nocturia: R35.1

## 2015-05-23 HISTORY — DX: Personal history of other malignant neoplasm of skin: Z85.828

## 2015-05-23 HISTORY — DX: Reserved for inherently not codable concepts without codable children: IMO0001

## 2015-05-23 HISTORY — DX: Personal history of other medical treatment: Z92.89

## 2015-05-23 LAB — BASIC METABOLIC PANEL
Anion gap: 11 (ref 5–15)
BUN: 16 mg/dL (ref 6–20)
CALCIUM: 9.3 mg/dL (ref 8.9–10.3)
CO2: 23 mmol/L (ref 22–32)
CREATININE: 1.01 mg/dL — AB (ref 0.44–1.00)
Chloride: 104 mmol/L (ref 101–111)
GFR calc non Af Amer: 53 mL/min — ABNORMAL LOW (ref >60)
Glucose, Bld: 116 mg/dL — ABNORMAL HIGH (ref 65–99)
Potassium: 4 mmol/L (ref 3.5–5.1)
SODIUM: 138 mmol/L (ref 135–145)

## 2015-05-23 LAB — CBC
HEMATOCRIT: 32.6 % — AB (ref 36.0–46.0)
HEMOGLOBIN: 10.3 g/dL — AB (ref 12.0–15.0)
MCH: 27.3 pg (ref 26.0–34.0)
MCHC: 31.6 g/dL (ref 30.0–36.0)
MCV: 86.5 fL (ref 78.0–100.0)
PLATELETS: 221 10*3/uL (ref 150–400)
RBC: 3.77 MIL/uL — ABNORMAL LOW (ref 3.87–5.11)
RDW: 15.8 % — AB (ref 11.5–15.5)
WBC: 10 10*3/uL (ref 4.0–10.5)

## 2015-05-24 LAB — HEMOGLOBIN A1C
Hgb A1c MFr Bld: 5.9 % — ABNORMAL HIGH (ref 4.8–5.6)
MEAN PLASMA GLUCOSE: 123 mg/dL

## 2015-05-25 NOTE — H&P (Signed)
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 Deland Pretty for primary care, Dr. Clayton Lefort from cardiology because she is on xarelto (previously coumadin) transient atrial fibrillation  She has no prior breast problems . Receives annual mammographic screening. Recent mammogram showed 2 suspicious masses in the right breast. A 6 mm mass at 3 o'clock position 7 cm the nipple and the second 1.3 cm mass at the 2 o'clock position 5 cm from the nipple. The outer edge of each of these masses masses or 4.3 cm apart. We think this is multifocal but not necessarily multicentric.  Since her last visit she has been discussed and breast cancer conference. Dr. Isidore Moos for radiation oncology has discussed the patient's prior and current oncology issues with Dr. Alvy Bimler. The recommendation is that she would probably not tolerate any further chemotherapy as she had serious complications last time. She is also felt to be too feeble to tolerate a 5 or 6 week course of whole breast radiation therapy. She has very small breast and we think that a lumpectomy would leave a significant cosmetic defect. For this reason I am recommending a right total mastectomy without lymph node biopsy and postop adjuvant antiestrogen therapy. I discussed this with the patient and her husband at length and they are in full agreement. They have also received this recommendation from Dr. Shelia Media, her primary care physician. She asked about reconstruction and I offered to send her for a plastic surgery consultation, but told her that I advised against that at  this time due to her decreased performance status and she agrees. We going to go ahead with a right total mastectomy.  I discussed the indications, details, techniques and numerous risk of the surgery with the patient and her husband. She is aware the risk of bleeding, infection, shoulder disability, rare incidence of arm swelling, skin necrosis, nerve damage with chronic pain or numbness, cardiac, pulmonary and thromboembolic problems. She knows that she will need to spend one or 2 nights in the hospital depending on how well she does. She has she will need postoperative physical therapy for her shoulder. She understands all these issues well. At this time all of her questions were answered. She agrees with this plan.  She states that she has discussed her anticoagulation with Dr. Einar Gip. She will stop her Xarelto 3-4 days preop. She has already been given a prescription for Lovenox and syringes and needles for a bridge and she has that at home. 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 I told her not to take any Lovenox the day of surgery.   Allergies  Demerol *ANALGESICS - OPIOID* PenicillAMINE *Assorted Classes** Sulfa Antibiotics  Medication History  Synthroid (200MCG Tablet, Oral) Active. MetFORMIN HCl (500MG  Tablet, Oral) Active. Singulair (4MG  Packet, Oral) Active. Pravachol (20MG  Tablet, Oral) Active. Vitamin D (1000UNIT Tablet, Oral) Active. Vitamin B-2 (100MG  Tablet, Oral) Active. Xarelto (20MG  Tablet, Oral) Active. Advair Diskus (500-50MCG/DOSE Aero Pow Br Act, Inhalation) Active. Advair Diskus (250-50MCG/DOSE Aero Pow Br Act, Inhalation) Active. Cefpodoxime Proxetil (100MG  Tablet, Oral) Active. Montelukast Sodium (10MG  Tablet, Oral) Active.  OneTouch Ultra Blue (In Vitro) Active. Premarin (0.625MG /GM Cream, Vaginal) Active. TRUEplus Lancets 28G Active. Medications Reconciled  Vitals  Weight: 139.6 lb Height: 65in Body  Surface Area: 1.7 m Body Mass Index: 23.23 kg/m  Temp.: 98.1F(Oral)  Pulse: 82 (Regular)  BP: 130/68 (Sitting, Left Arm, Standard)       Physical Exam  General Mental Status-Alert. General Appearance-Not in acute distress. Posture-Normal posture. Gait-Normal. Note: Ambulating independently. BMI 23. Moves slowly. Slightly deconditioned. Mental status normal. Good insight. Husband is present throughout the encounter.   Head and Neck Head-normocephalic, atraumatic with no lesions or palpable masses. Trachea-midline. Thyroid Gland Characteristics - normal size and consistency and no palpable nodules.  Chest and Lung Exam Chest and lung exam reveals -on auscultation, normal breath sounds, no adventitious sounds and normal vocal resonance. Note: Port right infraclavicular area with catheter going up over the clavicle into the right internal jugular vein   Breast Note: Breasts are relatively small and atrophic. 2 biopsy sites right breast medially at the 3 o'clock position are healed well. Minimal ecchymoses. Tiny hematoma. Not sure I can feel a discrete mass. No other masses elsewhere in either breast. No axillary adenopathy. No other skin changes. She has a port in the right infraclavicular area going up in the right internal jugular vein for her lymphoma chemotherapy.   Cardiovascular Cardiovascular examination reveals -normal heart sounds, regular rate and rhythm with no murmurs and femoral artery auscultation bilaterally reveals normal pulses, no bruits, no thrills.  Abdomen Inspection Inspection of the abdomen reveals - No Hernias. Palpation/Percussion Palpation and Percussion of the abdomen reveal - Soft, Non Tender, No Rigidity (guarding), No hepatosplenomegaly and No Palpable abdominal masses. Note: Well healed Pfannenstiel incision.   Neurologic Neurologic evaluation reveals -alert and oriented x 3 with no impairment of recent or  remote memory, normal attention span and ability to concentrate, normal sensation and normal coordination.  Musculoskeletal Normal Exam - Bilateral-Upper Extremity Strength Normal and Lower Extremity Strength Normal.    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 You should hear from our office's scheduling department within 5 working days about the location, date, and time of surgery. We try to make accommodations for patient's preferences in scheduling surgery, but sometimes the OR schedule or the surgeon's schedule prevents [place] from making those accommodations. If you have not heard from our office [phone]) in 5 working days, call the office and ask for your surgeon's nurse. If you have other questions about your diagnosis, plan, or surgery, call the office and ask for your surgeon's nurse.   Your recent imaging studies and biopsy showed that you have 2 cancers in the medial quadrant of the right breast. they are relatively small but are over 4 cm apart. They are both invasive cancers. We have presented your case at breast conference and we have discussed your past history and current situation with Dr. Alvy Bimler.  We do not think that you would tolerate any chemotherapy, and so we do not need to do any lymph node biopsies, since your lymph nodes appear to be normal We do not think you would tolerate a 5 week course of radiation therapy. We therefore recommend that you undergo a right total mastectomy  and once you recovered from that to be placed on antiestrogen medication  You will be referred to physical therapy  postop for shoulder exercises We did talk about the pros and cons of plastic surgical consultation and possible reconstruction, but we decided that you probably would not tolerate that at this time, and that a plastic surgeon would be unlikely to offer immediate reconstruction. Nevertheless, you may see a plastic surgeon at any time for consultation  You will need to be off of Xarelto for at least 3 days. You state that Dr. Einar Gip gave you prescription for Lovenox to bridge this. Do not take Lovenox the day of surgery.  We will begin the scheduling process for right total mastectomy immediately. We would hope to be able to do this surgery in the near future We have discussed the indications, details, and numerous risk of the surgery with you and your husband. We have explained the rationale of while we have selected this approach. You state you're in agreement with this.    Edsel Petrin. Dalbert Batman, M.D., [facility] General and Minimally invasive Surgery Breast and Colorectal Surgery Office:   [phone] Pager:   [phone]

## 2015-05-27 ENCOUNTER — Ambulatory Visit (HOSPITAL_COMMUNITY): Payer: Medicare Other | Admitting: Anesthesiology

## 2015-05-27 ENCOUNTER — Encounter (HOSPITAL_COMMUNITY): Payer: Self-pay | Admitting: *Deleted

## 2015-05-27 ENCOUNTER — Encounter (HOSPITAL_COMMUNITY)
Admission: RE | Disposition: A | Discharge status: Home / Self Care | Payer: Self-pay | Source: Ambulatory Visit | Attending: General Surgery

## 2015-05-27 ENCOUNTER — Ambulatory Visit (HOSPITAL_COMMUNITY)
Admission: RE | Admit: 2015-05-27 | Discharge: 2015-05-28 | Disposition: A | Discharge status: Home / Self Care | Payer: Medicare Other | Source: Ambulatory Visit | Attending: General Surgery | Admitting: General Surgery

## 2015-05-27 DIAGNOSIS — I10 Essential (primary) hypertension: Secondary | ICD-10-CM | POA: Diagnosis not present

## 2015-05-27 DIAGNOSIS — Z7901 Long term (current) use of anticoagulants: Secondary | ICD-10-CM | POA: Insufficient documentation

## 2015-05-27 DIAGNOSIS — C50911 Malignant neoplasm of unspecified site of right female breast: Secondary | ICD-10-CM | POA: Diagnosis present

## 2015-05-27 DIAGNOSIS — J45909 Unspecified asthma, uncomplicated: Secondary | ICD-10-CM | POA: Diagnosis not present

## 2015-05-27 DIAGNOSIS — I129 Hypertensive chronic kidney disease with stage 1 through stage 4 chronic kidney disease, or unspecified chronic kidney disease: Secondary | ICD-10-CM | POA: Diagnosis not present

## 2015-05-27 DIAGNOSIS — G4730 Sleep apnea, unspecified: Secondary | ICD-10-CM | POA: Diagnosis not present

## 2015-05-27 DIAGNOSIS — E119 Type 2 diabetes mellitus without complications: Secondary | ICD-10-CM | POA: Diagnosis not present

## 2015-05-27 DIAGNOSIS — D649 Anemia, unspecified: Secondary | ICD-10-CM | POA: Insufficient documentation

## 2015-05-27 DIAGNOSIS — Z79890 Hormone replacement therapy (postmenopausal): Secondary | ICD-10-CM | POA: Insufficient documentation

## 2015-05-27 DIAGNOSIS — C8591 Non-Hodgkin lymphoma, unspecified, lymph nodes of head, face, and neck: Secondary | ICD-10-CM | POA: Insufficient documentation

## 2015-05-27 DIAGNOSIS — Z7951 Long term (current) use of inhaled steroids: Secondary | ICD-10-CM | POA: Diagnosis not present

## 2015-05-27 DIAGNOSIS — C50311 Malignant neoplasm of lower-inner quadrant of right female breast: Secondary | ICD-10-CM | POA: Insufficient documentation

## 2015-05-27 DIAGNOSIS — N189 Chronic kidney disease, unspecified: Secondary | ICD-10-CM | POA: Diagnosis not present

## 2015-05-27 DIAGNOSIS — I4891 Unspecified atrial fibrillation: Secondary | ICD-10-CM | POA: Insufficient documentation

## 2015-05-27 DIAGNOSIS — C50211 Malignant neoplasm of upper-inner quadrant of right female breast: Secondary | ICD-10-CM | POA: Diagnosis present

## 2015-05-27 DIAGNOSIS — Z7984 Long term (current) use of oral hypoglycemic drugs: Secondary | ICD-10-CM | POA: Diagnosis not present

## 2015-05-27 DIAGNOSIS — Z9221 Personal history of antineoplastic chemotherapy: Secondary | ICD-10-CM | POA: Diagnosis not present

## 2015-05-27 DIAGNOSIS — I351 Nonrheumatic aortic (valve) insufficiency: Secondary | ICD-10-CM | POA: Insufficient documentation

## 2015-05-27 DIAGNOSIS — Z170 Estrogen receptor positive status [ER+]: Secondary | ICD-10-CM | POA: Insufficient documentation

## 2015-05-27 DIAGNOSIS — Z79899 Other long term (current) drug therapy: Secondary | ICD-10-CM | POA: Insufficient documentation

## 2015-05-27 DIAGNOSIS — M1990 Unspecified osteoarthritis, unspecified site: Secondary | ICD-10-CM | POA: Diagnosis not present

## 2015-05-27 DIAGNOSIS — C50411 Malignant neoplasm of upper-outer quadrant of right female breast: Secondary | ICD-10-CM | POA: Diagnosis not present

## 2015-05-27 HISTORY — PX: TOTAL MASTECTOMY: SHX6129

## 2015-05-27 LAB — CBC
HCT: 32 % — ABNORMAL LOW (ref 36.0–46.0)
HEMOGLOBIN: 10.2 g/dL — AB (ref 12.0–15.0)
MCH: 27.3 pg (ref 26.0–34.0)
MCHC: 31.9 g/dL (ref 30.0–36.0)
MCV: 85.6 fL (ref 78.0–100.0)
Platelets: 195 10*3/uL (ref 150–400)
RBC: 3.74 MIL/uL — AB (ref 3.87–5.11)
RDW: 15.8 % — ABNORMAL HIGH (ref 11.5–15.5)
WBC: 8.1 10*3/uL (ref 4.0–10.5)

## 2015-05-27 LAB — PROTIME-INR
INR: 1.11 (ref 0.00–1.49)
PROTHROMBIN TIME: 14.1 s (ref 11.6–15.2)

## 2015-05-27 LAB — GLUCOSE, CAPILLARY
GLUCOSE-CAPILLARY: 108 mg/dL — AB (ref 65–99)
GLUCOSE-CAPILLARY: 117 mg/dL — AB (ref 65–99)

## 2015-05-27 LAB — CREATININE, SERUM
CREATININE: 0.68 mg/dL (ref 0.44–1.00)
GFR calc Af Amer: >60 mL/min (ref >60)

## 2015-05-27 SURGERY — MASTECTOMY, SIMPLE
Anesthesia: General | Site: Breast | Laterality: Right

## 2015-05-27 MED ORDER — FENTANYL CITRATE (PF) 250 MCG/5ML IJ SOLN
INTRAMUSCULAR | Status: AC
  Filled 2015-05-27: qty 5

## 2015-05-27 MED ORDER — PROPOFOL 10 MG/ML IV BOLUS
INTRAVENOUS | Status: DC | PRN
  Administered 2015-05-27: 120 mg via INTRAVENOUS

## 2015-05-27 MED ORDER — BUPIVACAINE-EPINEPHRINE (PF) 0.5% -1:200000 IJ SOLN
INTRAMUSCULAR | Status: AC
  Filled 2015-05-27: qty 30

## 2015-05-27 MED ORDER — LIDOCAINE HCL (CARDIAC) 20 MG/ML IV SOLN
INTRAVENOUS | Status: AC
  Filled 2015-05-27: qty 5

## 2015-05-27 MED ORDER — MOMETASONE FURO-FORMOTEROL FUM 200-5 MCG/ACT IN AERO
2.0000 | INHALATION_SPRAY | Freq: Two times a day (BID) | RESPIRATORY_TRACT | Status: DC
  Administered 2015-05-27 – 2015-05-28 (×2): 2 via RESPIRATORY_TRACT
  Filled 2015-05-27: qty 8.8

## 2015-05-27 MED ORDER — ONDANSETRON HCL 4 MG/2ML IJ SOLN
INTRAMUSCULAR | Status: DC | PRN
  Administered 2015-05-27: 4 mg via INTRAVENOUS

## 2015-05-27 MED ORDER — MAGNESIUM OXIDE 400 (241.3 MG) MG PO TABS
400.0000 mg | ORAL_TABLET | Freq: Every day | ORAL | Status: DC
  Administered 2015-05-27 – 2015-05-28 (×2): 400 mg via ORAL
  Filled 2015-05-27 (×2): qty 1

## 2015-05-27 MED ORDER — DIPHENOXYLATE-ATROPINE 2.5-0.025 MG PO TABS: 1.0000 | ORAL_TABLET | Freq: Four times a day (QID) | ORAL | Status: DC | PRN

## 2015-05-27 MED ORDER — BISACODYL 5 MG PO TBEC: 5.0000 mg | DELAYED_RELEASE_TABLET | Freq: Every day | ORAL | Status: DC | PRN

## 2015-05-27 MED ORDER — FENTANYL CITRATE (PF) 100 MCG/2ML IJ SOLN
INTRAMUSCULAR | Status: DC | PRN
  Administered 2015-05-27 (×3): 25 ug via INTRAVENOUS
  Administered 2015-05-27: 50 ug via INTRAVENOUS
  Administered 2015-05-27: 25 ug via INTRAVENOUS

## 2015-05-27 MED ORDER — PHENYLEPHRINE 40 MCG/ML (10ML) SYRINGE FOR IV PUSH (FOR BLOOD PRESSURE SUPPORT)
PREFILLED_SYRINGE | INTRAVENOUS | Status: AC
  Filled 2015-05-27: qty 10

## 2015-05-27 MED ORDER — TRAMADOL-ACETAMINOPHEN 37.5-325 MG PO TABS
1.0000 | ORAL_TABLET | Freq: Four times a day (QID) | ORAL | Status: DC | PRN
  Administered 2015-05-28: 1 via ORAL
  Filled 2015-05-27: qty 1

## 2015-05-27 MED ORDER — ALBUTEROL SULFATE (2.5 MG/3ML) 0.083% IN NEBU: 3.0000 mL | INHALATION_SOLUTION | Freq: Four times a day (QID) | RESPIRATORY_TRACT | Status: DC | PRN

## 2015-05-27 MED ORDER — LIDOCAINE HCL (CARDIAC) 20 MG/ML IV SOLN
INTRAVENOUS | Status: DC | PRN
  Administered 2015-05-27: 50 mg via INTRAVENOUS

## 2015-05-27 MED ORDER — MONTELUKAST SODIUM 10 MG PO TABS
10.0000 mg | ORAL_TABLET | Freq: Every day | ORAL | Status: DC
  Administered 2015-05-28: 10 mg via ORAL
  Filled 2015-05-27: qty 1

## 2015-05-27 MED ORDER — FENTANYL CITRATE (PF) 100 MCG/2ML IJ SOLN
INTRAMUSCULAR | Status: AC
  Filled 2015-05-27: qty 2

## 2015-05-27 MED ORDER — VITAMIN D 1000 UNITS PO TABS
2000.0000 [IU] | ORAL_TABLET | Freq: Every day | ORAL | Status: DC
  Administered 2015-05-27 – 2015-05-28 (×2): 2000 [IU] via ORAL
  Filled 2015-05-27 (×2): qty 2

## 2015-05-27 MED ORDER — DILTIAZEM HCL ER COATED BEADS 120 MG PO CP24
120.0000 mg | ORAL_CAPSULE | Freq: Two times a day (BID) | ORAL | Status: DC
  Administered 2015-05-27 – 2015-05-28 (×2): 120 mg via ORAL
  Filled 2015-05-27 (×3): qty 1

## 2015-05-27 MED ORDER — EPHEDRINE SULFATE 50 MG/ML IJ SOLN
INTRAMUSCULAR | Status: AC
  Filled 2015-05-27: qty 1

## 2015-05-27 MED ORDER — LIP MEDEX EX OINT
TOPICAL_OINTMENT | CUTANEOUS | Status: AC
  Filled 2015-05-27: qty 7

## 2015-05-27 MED ORDER — PHENYLEPHRINE HCL 10 MG/ML IJ SOLN
INTRAMUSCULAR | Status: DC | PRN
  Administered 2015-05-27: 40 ug via INTRAVENOUS
  Administered 2015-05-27 (×2): 80 ug via INTRAVENOUS

## 2015-05-27 MED ORDER — DEXTROSE 5 % IV SOLN
2.0000 g | INTRAVENOUS | Status: AC
  Administered 2015-05-27: 2 g via INTRAVENOUS
  Filled 2015-05-27: qty 20

## 2015-05-27 MED ORDER — ENOXAPARIN SODIUM 30 MG/0.3ML ~~LOC~~ SOLN
30.0000 mg | SUBCUTANEOUS | Status: DC
  Filled 2015-05-27: qty 0.3

## 2015-05-27 MED ORDER — DEXAMETHASONE SODIUM PHOSPHATE 10 MG/ML IJ SOLN
INTRAMUSCULAR | Status: AC
  Filled 2015-05-27: qty 1

## 2015-05-27 MED ORDER — PROCHLORPERAZINE MALEATE 10 MG PO TABS: 10.0000 mg | ORAL_TABLET | Freq: Four times a day (QID) | ORAL | Status: DC | PRN

## 2015-05-27 MED ORDER — ONDANSETRON 4 MG PO TBDP: 4.0000 mg | ORAL_TABLET | Freq: Four times a day (QID) | ORAL | Status: DC | PRN

## 2015-05-27 MED ORDER — MAGNESIUM OXIDE -MG SUPPLEMENT 500 MG PO TABS
500.0000 mg | ORAL_TABLET | Freq: Every day | ORAL | Status: DC
  Filled 2015-05-27: qty 1

## 2015-05-27 MED ORDER — CHLORHEXIDINE GLUCONATE 4 % EX LIQD: 1.0000 "application " | Freq: Once | CUTANEOUS | Status: DC

## 2015-05-27 MED ORDER — SODIUM CHLORIDE 0.9 % IJ SOLN
INTRAMUSCULAR | Status: AC
  Filled 2015-05-27: qty 10

## 2015-05-27 MED ORDER — ONDANSETRON HCL 4 MG/2ML IJ SOLN: 4.0000 mg | Freq: Four times a day (QID) | INTRAMUSCULAR | Status: DC | PRN

## 2015-05-27 MED ORDER — FENTANYL CITRATE (PF) 100 MCG/2ML IJ SOLN
12.5000 ug | INTRAMUSCULAR | Status: DC | PRN
  Administered 2015-05-27: 12.5 ug via INTRAVENOUS
  Filled 2015-05-27 (×2): qty 2

## 2015-05-27 MED ORDER — LACTATED RINGERS IV SOLN
INTRAVENOUS | Status: DC
  Administered 2015-05-27: 10:00:00 via INTRAVENOUS
  Administered 2015-05-27: 1000 mL via INTRAVENOUS

## 2015-05-27 MED ORDER — DEXAMETHASONE SODIUM PHOSPHATE 10 MG/ML IJ SOLN
INTRAMUSCULAR | Status: DC | PRN
  Administered 2015-05-27: 10 mg via INTRAVENOUS

## 2015-05-27 MED ORDER — PROPOFOL 10 MG/ML IV BOLUS
INTRAVENOUS | Status: AC
  Filled 2015-05-27: qty 20

## 2015-05-27 MED ORDER — PRAVASTATIN SODIUM 20 MG PO TABS
20.0000 mg | ORAL_TABLET | ORAL | Status: DC
  Filled 2015-05-27 (×2): qty 1

## 2015-05-27 MED ORDER — POTASSIUM CHLORIDE IN NACL 20-0.9 MEQ/L-% IV SOLN
INTRAVENOUS | Status: DC
  Administered 2015-05-27 – 2015-05-28 (×2): via INTRAVENOUS
  Filled 2015-05-27 (×3): qty 1000

## 2015-05-27 MED ORDER — FENTANYL CITRATE (PF) 100 MCG/2ML IJ SOLN
25.0000 ug | INTRAMUSCULAR | Status: DC | PRN
  Administered 2015-05-27 (×3): 25 ug via INTRAVENOUS

## 2015-05-27 MED ORDER — ONDANSETRON HCL 4 MG PO TABS: 8.0000 mg | ORAL_TABLET | Freq: Three times a day (TID) | ORAL | Status: DC | PRN

## 2015-05-27 MED ORDER — ONDANSETRON HCL 4 MG/2ML IJ SOLN
INTRAMUSCULAR | Status: AC
  Filled 2015-05-27: qty 2

## 2015-05-27 MED ORDER — LEVOTHYROXINE SODIUM 200 MCG PO TABS
225.0000 ug | ORAL_TABLET | Freq: Every day | ORAL | Status: DC
  Administered 2015-05-28: 225 ug via ORAL
  Filled 2015-05-27 (×2): qty 1

## 2015-05-27 MED ORDER — FLECAINIDE ACETATE 100 MG PO TABS
100.0000 mg | ORAL_TABLET | Freq: Two times a day (BID) | ORAL | Status: DC
  Administered 2015-05-27 – 2015-05-28 (×2): 100 mg via ORAL
  Filled 2015-05-27 (×3): qty 1

## 2015-05-27 MED ORDER — EPHEDRINE SULFATE 50 MG/ML IJ SOLN
INTRAMUSCULAR | Status: DC | PRN
  Administered 2015-05-27: 5 mg via INTRAVENOUS

## 2015-05-27 MED ORDER — METFORMIN HCL ER 500 MG PO TB24
500.0000 mg | ORAL_TABLET | Freq: Two times a day (BID) | ORAL | Status: DC
  Administered 2015-05-27 – 2015-05-28 (×2): 500 mg via ORAL
  Filled 2015-05-27 (×4): qty 1

## 2015-05-27 MED ORDER — CEFAZOLIN SODIUM-DEXTROSE 2-3 GM-% IV SOLR
INTRAVENOUS | Status: AC
  Filled 2015-05-27: qty 50

## 2015-05-27 MED ORDER — CYANOCOBALAMIN 500 MCG PO TABS
500.0000 ug | ORAL_TABLET | Freq: Every day | ORAL | Status: DC
  Administered 2015-05-27 – 2015-05-28 (×2): 500 ug via ORAL
  Filled 2015-05-27 (×2): qty 1

## 2015-05-27 SURGICAL SUPPLY — 33 items
ADH SKN CLS APL DERMABOND .7 (GAUZE/BANDAGES/DRESSINGS) ×1
APPLIER CLIP 11 MED OPEN (CLIP) ×2
APR CLP MED 11 20 MLT OPN (CLIP) ×1
BANDAGE ACE 6X5 VEL STRL LF (GAUZE/BANDAGES/DRESSINGS) ×2 IMPLANT
CLIP APPLIE 11 MED OPEN (CLIP) ×1 IMPLANT
COVER SURGICAL LIGHT HANDLE (MISCELLANEOUS) ×2 IMPLANT
DERMABOND ADVANCED (GAUZE/BANDAGES/DRESSINGS) ×1
DERMABOND ADVANCED .7 DNX12 (GAUZE/BANDAGES/DRESSINGS) IMPLANT
DRAIN CHANNEL RND F F (WOUND CARE) ×4 IMPLANT
DRAPE LAPAROSCOPIC ABDOMINAL (DRAPES) IMPLANT
DRAPE SHEET LG 3/4 BI-LAMINATE (DRAPES) IMPLANT
DRSG EMULSION OIL 3X16 NADH (GAUZE/BANDAGES/DRESSINGS) IMPLANT
DRSG PAD ABDOMINAL 8X10 ST (GAUZE/BANDAGES/DRESSINGS) ×3 IMPLANT
ELECT REM PT RETURN 9FT ADLT (ELECTROSURGICAL) ×2
ELECTRODE REM PT RTRN 9FT ADLT (ELECTROSURGICAL) IMPLANT
EVACUATOR SILICONE 100CC (DRAIN) ×3 IMPLANT
GAUZE SPONGE 4X4 12PLY STRL (GAUZE/BANDAGES/DRESSINGS) ×2 IMPLANT
GLOVE BIOGEL PI IND STRL 7.0 (GLOVE) ×1 IMPLANT
GLOVE BIOGEL PI INDICATOR 7.0 (GLOVE) ×1
GLOVE EUDERMIC 7 POWDERFREE (GLOVE) ×2 IMPLANT
GOWN STRL REUS W/TWL LRG LVL3 (GOWN DISPOSABLE) ×2 IMPLANT
GOWN STRL REUS W/TWL XL LVL3 (GOWN DISPOSABLE) ×4 IMPLANT
KIT BASIN OR (CUSTOM PROCEDURE TRAY) ×2 IMPLANT
NEEDLE HYPO 22GX1.5 SAFETY (NEEDLE) IMPLANT
PACK GENERAL/GYN (CUSTOM PROCEDURE TRAY) ×2 IMPLANT
PACK UNIVERSAL I (CUSTOM PROCEDURE TRAY) IMPLANT
SPONGE DRAIN TRACH 4X4 STRL 2S (GAUZE/BANDAGES/DRESSINGS) ×3 IMPLANT
STAPLER VISISTAT 35W (STAPLE) ×2 IMPLANT
SUT ETHILON 3 0 PS 1 (SUTURE) ×2 IMPLANT
SUT MNCRL AB 4-0 PS2 18 (SUTURE) ×2 IMPLANT
SUT SILK 2 0 SH (SUTURE) ×1 IMPLANT
SYR CONTROL 10ML LL (SYRINGE) IMPLANT
TOWEL OR 17X26 10 PK STRL BLUE (TOWEL DISPOSABLE) ×2 IMPLANT

## 2015-05-27 NOTE — Anesthesia Procedure Notes (Signed)
Procedure Name: LMA Insertion Date/Time: 05/27/2015 9:01 AM Performed by: Glory Buff Pre-anesthesia Checklist: Patient identified, Emergency Drugs available, Suction available and Patient being monitored Patient Re-evaluated:Patient Re-evaluated prior to inductionOxygen Delivery Method: Circle system utilized Preoxygenation: Pre-oxygenation with 100% oxygen Intubation Type: IV induction LMA: LMA inserted LMA Size: 4.0 Number of attempts: 1 Placement Confirmation: CO2 detector Tube secured with: Tape

## 2015-05-27 NOTE — Interval H&P Note (Signed)
History and Physical Interval Note:  05/27/2015 8:53 AM  Patty [name]  has presented today for surgery, with the diagnosis of RIGHT BREAST CANCER LOWER INNER QUADRANT  The various methods of treatment have been discussed with the patient and family. After consideration of risks, benefits and other options for treatment, the patient has consented to  Procedure(s): RIGHT TOTAL MASTECTOMY (Right) as a surgical intervention .  The patient's history has been reviewed, patient examined, no change in status, stable for surgery.  I have reviewed the patient's chart and labs.  Questions were answered to the patient's satisfaction.     Adin Hector

## 2015-05-27 NOTE — Anesthesia Preprocedure Evaluation (Addendum)
Anesthesia Evaluation  Patient identified by MRN, date of birth, ID band Patient awake    Reviewed: Allergy & Precautions, NPO status , Patient's Chart, lab work & pertinent test results  History of Anesthesia Complications (+) history of anesthetic complications  Airway Mallampati: II  TM Distance: >3 FB Neck ROM: Full    Dental no notable dental hx.    Pulmonary shortness of breath, asthma , sleep apnea , pneumonia,    Pulmonary exam normal breath sounds clear to auscultation       Cardiovascular hypertension, Pt. on medications Normal cardiovascular exam+ dysrhythmias Atrial Fibrillation  Rhythm:Regular Rate:Normal  ECHO 01-04-14  Study Conclusions  - Left ventricle: The cavity size was normal. There was moderate concentric hypertrophy. Systolic function was normal. The estimated ejection fraction was in the range of 55% to 60%. Wall motion was normal; there were no regional wall motion abnormalities. Left ventricular diastolic function parameters were normal. - Aortic valve: There was trivial regurgitation. - Mitral valve: Calcified annulus. There was mild regurgitation. - Left atrium: The atrium was mildly dilated. - Pericardium, extracardiac: There was no pericardial effusion.    Neuro/Psych negative neurological ROS  negative psych ROS   GI/Hepatic negative GI ROS, Neg liver ROS,   Endo/Other  diabetesHypothyroidism   Renal/GU Renal disease  negative genitourinary   Musculoskeletal  (+) Arthritis ,   Abdominal   Peds negative pediatric ROS (+)  Hematology  (+) anemia ,   Anesthesia Other Findings   Reproductive/Obstetrics negative OB ROS                           Anesthesia Physical Anesthesia Plan  ASA: III  Anesthesia Plan: General   Post-op Pain Management:    Induction: Intravenous  Airway Management Planned: LMA  Additional Equipment:   Intra-op  Plan:   Post-operative Plan: Extubation in OR  Informed Consent: I have reviewed the patients History and Physical, chart, labs and discussed the procedure including the risks, benefits and alternatives for the proposed anesthesia with the patient or authorized representative who has indicated his/her understanding and acceptance.   Dental advisory given  Plan Discussed with: CRNA  Anesthesia Plan Comments:         Anesthesia Quick Evaluation

## 2015-05-27 NOTE — Transfer of Care (Signed)
Immediate Anesthesia Transfer of Care Note  Patient: Patty [name]  Procedure(s) Performed: Procedure(s): RIGHT TOTAL MASTECTOMY (Right)  Patient Location: PACU  Anesthesia Type:General  Level of Consciousness: awake, alert  and oriented  Airway & Oxygen Therapy: Patient Spontanous Breathing and Patient connected to face mask oxygen  Post-op Assessment: Report given to RN and Post -op Vital signs reviewed and stable  Post vital signs: Reviewed and stable  Last Vitals:  Filed Vitals:   05/27/15 0650  BP: 135/75  Pulse: 92  Temp: 36.6 C  Resp: 16    Complications: No apparent anesthesia complications

## 2015-05-27 NOTE — Anesthesia Postprocedure Evaluation (Signed)
Anesthesia Post Note  Patient: Patty [name]  Procedure(s) Performed: Procedure(s) (LRB): RIGHT TOTAL MASTECTOMY (Right)  Patient location during evaluation: PACU Anesthesia Type: General Level of consciousness: awake and alert Pain management: pain level controlled Vital Signs Assessment: post-procedure vital signs reviewed and stable Respiratory status: spontaneous breathing, nonlabored ventilation, respiratory function stable and patient connected to nasal cannula oxygen Cardiovascular status: blood pressure returned to baseline and stable Postop Assessment: no signs of nausea or vomiting Anesthetic complications: no    Last Vitals:  Filed Vitals:   05/27/15 1145 05/27/15 1207  BP: 110/99 119/67  Pulse: 79 80  Temp:  36.6 C  Resp: 17 15    Last Pain:  Filed Vitals:   05/27/15 1230  PainSc: 2                  [name]

## 2015-05-27 NOTE — Op Note (Addendum)
Patient Name:           Patty [name]   Date of Surgery:        05/27/2015  Pre op Diagnosis:      Multifocal cancer right breast  Post op Diagnosis:    Multifocal cancer right breast  Procedure:                 Right total mastectomy  Surgeon:                     Edsel Petrin. 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 Deland Pretty for primary care, Dr. Clayton Lefort from cardiology because she is on xarelto (previously coumadin) transient atrial fibrillation       She has no prior breast problems . Receives annual mammographic screening. Recent mammogram showed 2 suspicious masses in the right breast. A 6 mm mass at 3 o'clock position 7 cm the nipple and the second 1.3 cm mass at the 2 o'clock position 5 cm from the nipple. The outer edge of each of these masses masses or 4.3 cm apart. We think this is multifocal but not necessarily multicentric.      Since her last visit she has been discussed and breast cancer conference. Dr. Isidore Moos from radiation oncology has discussed the patient's prior and current oncology issues with Dr. Alvy Bimler. The recommendation is that she would probably not tolerate any further chemotherapy as she had serious complications last time. She is also felt to be too feeble to tolerate a 5 or 6 week course of whole breast radiation therapy. She has very small breast and we think that a lumpectomy would leave a significant cosmetic defect. For this reason I am recommending a right total mastectomy without lymph node biopsy and postop adjuvant antiestrogen therapy. I discussed this with the patient and her husband at length and they are in full agreement. They have also received this recommendation from Dr. Shelia Media, her primary care  physician. She asked about reconstruction and I offered to send her for a plastic surgery consultation, but told her that I advised against that at this time due to her decreased performance status and she agrees. We going to go ahead with a right total mastectomy.    She has had a Port-A-Cath in the right in the infraclavicular area for some time.  It is felt that this will likely be necessary to help manage her in the future and will be left in place for now.  She states that she has discussed her anticoagulation with Dr. Einar Gip. She will stop her Xarelto 3-4 days preop. She has already been given a prescription for Lovenox and syringes and needles for a bridge and she has that at home. 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 I told her not to take any Lovenox the day of surgery.  Operative Findings:       The breast was medium size.  The mastectomy was uneventful.  There was no palpable mass in the axilla.  I left 1 drain.  Procedure in Detail:  Following the induction of general LMA anesthesia the patient's entire right chest and axilla were prepped and draped in a sterile fashion.  Intravenous antibiotics were given.  Surgical timeout was performed.     Using a marking pin I marked the anatomic boundaries of the breasts.  I then planned a transverse elliptical incision, which included resection of the nipple and areola.  Incision was made with a knife.  Skin flaps were raised superiorly to the infraclavicular area, staying away from the port, medially to the parasternal area, inferiorly  to the anterior rectus sheath, and laterally to the anterior border of the latissimus dorsi muscle.  The breast was then dissected off of the pectoralis major and minor muscles taking the fascia with it.  The lateral skin margin was marked with a silk suture and the specimen sent to the lab for  fresh routine evaluation.  There is no palpable mass in the axilla.  Hemostasis was  excellent and achieved with electrocautery.  The wound was irrigated with saline.  A single 91 [place] was drain was placed and brought out through separate stab incision inferolaterally and sutured the skin with nylon suture and connected to suction bulb.  The subcutaneous tissue was closed with interrupted 3-0 Vicryl sutures and skin closed with a running subcuticular 4-0 Monocryl and Dermabond.  Dry bandages and breast binder were placed.  The patient tolerated the procedure well was taken to PACU in stable condition.  EBL less than 50 mL.  Counts correct.  Complications none.     Edsel Petrin. Dalbert Batman, M.D., FACS General and Minimally Invasive Surgery Breast and Colorectal Surgery  05/27/2015 10:09 AM

## 2015-05-27 NOTE — Progress Notes (Signed)
Given by RN   

## 2015-05-28 DIAGNOSIS — Z7984 Long term (current) use of oral hypoglycemic drugs: Secondary | ICD-10-CM | POA: Diagnosis not present

## 2015-05-28 DIAGNOSIS — C50311 Malignant neoplasm of lower-inner quadrant of right female breast: Secondary | ICD-10-CM | POA: Diagnosis not present

## 2015-05-28 DIAGNOSIS — Z9221 Personal history of antineoplastic chemotherapy: Secondary | ICD-10-CM | POA: Diagnosis not present

## 2015-05-28 DIAGNOSIS — Z7951 Long term (current) use of inhaled steroids: Secondary | ICD-10-CM | POA: Diagnosis not present

## 2015-05-28 DIAGNOSIS — Z7901 Long term (current) use of anticoagulants: Secondary | ICD-10-CM | POA: Diagnosis not present

## 2015-05-28 DIAGNOSIS — Z79890 Hormone replacement therapy (postmenopausal): Secondary | ICD-10-CM | POA: Diagnosis not present

## 2015-05-28 LAB — CBC
HEMATOCRIT: 28.7 % — AB (ref 36.0–46.0)
HEMOGLOBIN: 9.3 g/dL — AB (ref 12.0–15.0)
MCH: 27 pg (ref 26.0–34.0)
MCHC: 32.4 g/dL (ref 30.0–36.0)
MCV: 83.2 fL (ref 78.0–100.0)
Platelets: 186 10*3/uL (ref 150–400)
RBC: 3.45 MIL/uL — ABNORMAL LOW (ref 3.87–5.11)
RDW: 15.2 % (ref 11.5–15.5)
WBC: 8.7 10*3/uL (ref 4.0–10.5)

## 2015-05-28 LAB — BASIC METABOLIC PANEL
ANION GAP: 8 (ref 5–15)
BUN: 10 mg/dL (ref 6–20)
CALCIUM: 9.1 mg/dL (ref 8.9–10.3)
CO2: 23 mmol/L (ref 22–32)
Chloride: 106 mmol/L (ref 101–111)
Creatinine, Ser: 0.57 mg/dL (ref 0.44–1.00)
GFR calc Af Amer: >60 mL/min (ref >60)
GLUCOSE: 145 mg/dL — AB (ref 65–99)
Potassium: 4.7 mmol/L (ref 3.5–5.1)
Sodium: 137 mmol/L (ref 135–145)

## 2015-05-28 MED ORDER — HYDROCODONE-ACETAMINOPHEN 5-325 MG PO TABS: 1.0000 | ORAL_TABLET | Freq: Four times a day (QID) | ORAL | Status: DC | PRN

## 2015-05-28 MED ORDER — ENOXAPARIN SODIUM 30 MG/0.3ML ~~LOC~~ SOLN
30.0000 mg | SUBCUTANEOUS | Status: DC
  Filled 2015-05-28 (×2): qty 0.3

## 2015-05-28 NOTE — Discharge Summary (Signed)
Patient ID: Patty [name] [id number] 75 y.o. 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 Deland Pretty for primary care, Dr. Clayton Lefort from cardiology because she is on xarelto (previously coumadin) transient atrial fibrillation  She has no prior breast problems . Receives annual mammographic screening. Recent mammogram showed 2 suspicious masses in the right breast. A 6 mm mass at 3 o'clock position 7 cm the nipple and the second 1.3 cm mass at the 2 o'clock position 5 cm from the nipple. The outer edge of each of these masses masses or 4.3 cm apart. We think this is multifocal but not necessarily multicentric.  Since her last visit she has been discussed in breast cancer conference. Dr. Isidore Moos from radiation oncology has discussed the patient's prior and current oncology issues with Dr. Alvy Bimler. The recommendation is that she would probably not tolerate any further chemotherapy as she had serious complications last time.  She is also felt to be too feeble to tolerate a 5 or 6 week course of whole breast radiation therapy. She has very small breast and we think that a lumpectomy would leave a significant cosmetic defect. For this reason I am recommending a right total mastectomy without lymph node biopsy and postop adjuvant antiestrogen therapy. I discussed this with the patient and her husband at length and they are in full agreement. They have also received this recommendation from Dr. Shelia Media, her primary care physician. She asked about reconstruction and I offered to send her for a plastic surgery consultation, but told her that I advised against that at this time due to her decreased performance status and she agrees. We going to go ahead with  a right total mastectomy.  She has had a Port-A-Cath in the right in the infraclavicular area for some time. It is felt that this will likely be necessary to help manage her in the future and will be left in place for now.      She states that she has discussed her anticoagulation with Dr. Einar Gip. She will stop her Xarelto 3-4 days preop. She has already been given a prescription for Lovenox and syringes and needles for a bridge and she has that at home. 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 I told her not to take any Lovenox the day of surgery.  Hospital Course: On the day of admission the patient was taken to the operating room and underwent a right total mastectomy.  The surgery was uneventful.  Final pathology is pending.  The patient was observed overnight and did well.  She has minimal pain.  She has tolerated her diet.  She has been up to a chair.  She is able to void without difficulty.  She is requesting to go home today.  She lives with her husband and her son lives nearby.      Examination on the day of discharge reveals stable vital signs.  The right mastectomy wound looks good.  No hematoma or fluid collections.  Drainage is  serosanguineous and low to moderate drainage overnight.  There is no arm swelling or sensory deficit in the arm.  Lab work on the day of discharge reveals stable hemoglobin at 9.3, potassium 4.7, creatinine 0.57, glucose 145.     I gave her instructions in diet, activities, and wound care.  She will be taught how to manage the drain and keep record keeping.  I will see her back in 8-9 days for a wound check and possible removal of the drain depending on the drainage.  Ultimately she'll be referred back to Dr. Calton Dach to consider antiestrogen therapy.  Ambulation with assistance is encouraged.  I told her to restart all of her medications including her Xarelto.  She has received detailed instructions in Xarelto and Lovenox from Dr. Einar Gip, her cardiologist  Consults: None  Significant Diagnostic Studies: Surgical pathology, pending  Treatments: surgery: Right total mastectomy  Disposition: Home  Patient Instructions:    Medication List    TAKE these medications        acetaminophen 500 MG tablet  Commonly known as:  TYLENOL  Take 1,000 mg by mouth every 6 (six) hours as needed for moderate pain, fever or headache.     cephALEXin 500 MG capsule  Commonly known as:  KEFLEX  Take 1 capsule (500 mg total) by mouth 4 (four) times daily.     diltiazem 120 MG 24 hr capsule  Commonly known as:  CARDIZEM CD  Take 120 mg by mouth 2 (two) times daily.     diphenoxylate-atropine 2.5-0.025 MG tablet  Commonly known as:  LOMOTIL  Take 1 tablet by mouth 4 (four) times daily as needed for diarrhea or loose stools.     enoxaparin 60 MG/0.6ML injection  Commonly known as:  LOVENOX  Inject 0.6 mLs (60 mg total) into the skin every 12 (twelve) hours. STOP XARELTO 3-4 DAYS PRIOR TO YOUR MASTECTOMY- THEN USE LOVENOX. (THIS IS PRIOR TO YOUR BREAST SURGERY/MASTECTOMY-PLEASE START THIS ONLY AFTER SPEAKING WITH YOUR SURGEON).     flecainide 100 MG tablet  Commonly known as:  TAMBOCOR  Take 100 mg by  mouth 2 (two)  times daily.     Fluticasone-Salmeterol 250-50 MCG/DOSE Aepb  Commonly known as:  ADVAIR  Inhale 2 puffs into the lungs every 12 (twelve) hours.     HYDROcodone-acetaminophen 5-325 MG tablet  Commonly known as:  NORCO  Take 1-2 tablets by mouth every 6 (six) hours as needed.     levothyroxine 75 MCG tablet  Commonly known as:  SYNTHROID, LEVOTHROID  Take 225 mcg by mouth daily before breakfast.     lidocaine-prilocaine cream  Commonly known as:  EMLA  Apply 1 application topically as needed. Apply to [facility] a Cath site at least one hour prior to needle stick as needed.     Magnesium Oxide 500 MG Tabs  Take 500 mg by mouth daily.     metFORMIN 500 MG 24 hr tablet  Commonly known as:  GLUCOPHAGE-XR  Take 500 mg by mouth 2 (two) times daily with a meal.     montelukast 10 MG tablet  Commonly known as:  SINGULAIR  Take 10 mg by mouth daily.     ondansetron 8 MG tablet  Commonly known as:  ZOFRAN  Take 8 mg by mouth every 8 (eight) hours as needed for nausea or vomiting.     pravastatin 40 MG tablet  Commonly known as:  PRAVACHOL  Take 20 mg by mouth See admin instructions. Take 1/2 tablet (20 mg) ONLY on Monday, Tuesday, Wednesday, and Thursday Nights.     PRESCRIPTION MEDICATION  Antibody Plan [facility]     PROAIR HFA 108 (90 Base) MCG/ACT inhaler  Generic drug:  albuterol  Inhale 2 puffs into the lungs every 6 (six) hours as needed for wheezing or shortness of breath.     albuterol (2.5 MG/3ML) 0.083% nebulizer solution  Commonly known as:  PROVENTIL  Inhale 3 mLs into the lungs every 6 (six) hours as needed for wheezing or shortness of breath.     prochlorperazine 10 MG tablet  Commonly known as:  COMPAZINE  Take 1 tablet (10 mg total) by mouth every 6 (six) hours as needed for nausea (nausea).     rivaroxaban 20 MG Tabs tablet  Commonly known as:  XARELTO  Take 20 mg by mouth daily with supper.     traMADol-acetaminophen 37.5-325 MG tablet  Commonly  known as:  ULTRACET  Take 1 tablet by mouth every 6 (six) hours as needed.     vitamin B-12 500 MCG tablet  Commonly known as:  CYANOCOBALAMIN  Take 500 mcg by mouth daily.     Vitamin D 2000 units tablet  Take 2,000 Units by mouth daily with lunch.        Activity: No sports.  No heavy lifting.  No driving a car.  Lots of ambulation encouraged Diet: low fat, low cholesterol diet Wound Care: as directed  Follow-up:  With Dr. Dalbert Batman in 8-9 days.  Signed: Edsel Petrin. Dalbert Batman, M.D., FACS General and minimally invasive surgery Breast and Colorectal Surgery  05/28/2015, 5:49 AM

## 2015-05-28 NOTE — Discharge Instructions (Signed)
-  see above 

## 2015-05-29 NOTE — Progress Notes (Signed)
Quick Note:  Inform patient of Pathology report,. Tell her that the pathology results look good. 2 small cancers were seen. Margins are negative. Appears to be a clean resection. Tell her I will discuss in detail at her first office visit.  hmi ______

## 2015-06-06 ENCOUNTER — Other Ambulatory Visit: Payer: Self-pay | Admitting: Hematology and Oncology

## 2015-06-06 DIAGNOSIS — I10 Essential (primary) hypertension: Secondary | ICD-10-CM | POA: Diagnosis not present

## 2015-06-06 DIAGNOSIS — I480 Paroxysmal atrial fibrillation: Secondary | ICD-10-CM | POA: Diagnosis not present

## 2015-06-09 ENCOUNTER — Encounter: Payer: Self-pay | Admitting: Internal Medicine

## 2015-06-09 DIAGNOSIS — Z0000 Encounter for general adult medical examination without abnormal findings: Secondary | ICD-10-CM | POA: Diagnosis not present

## 2015-06-09 DIAGNOSIS — N3020 Other chronic cystitis without hematuria: Secondary | ICD-10-CM | POA: Diagnosis not present

## 2015-06-09 DIAGNOSIS — B373 Candidiasis of vulva and vagina: Secondary | ICD-10-CM | POA: Diagnosis not present

## 2015-06-10 DIAGNOSIS — I480 Paroxysmal atrial fibrillation: Secondary | ICD-10-CM | POA: Diagnosis not present

## 2015-06-12 ENCOUNTER — Encounter: Payer: Self-pay | Admitting: Hematology and Oncology

## 2015-06-12 ENCOUNTER — Telehealth: Payer: Self-pay | Admitting: Hematology and Oncology

## 2015-06-12 ENCOUNTER — Ambulatory Visit: Payer: Medicare Other

## 2015-06-12 ENCOUNTER — Other Ambulatory Visit (HOSPITAL_BASED_OUTPATIENT_CLINIC_OR_DEPARTMENT_OTHER): Payer: Medicare Other

## 2015-06-12 ENCOUNTER — Telehealth: Payer: Self-pay | Admitting: *Deleted

## 2015-06-12 ENCOUNTER — Ambulatory Visit (HOSPITAL_BASED_OUTPATIENT_CLINIC_OR_DEPARTMENT_OTHER): Payer: Medicare Other | Admitting: Hematology and Oncology

## 2015-06-12 VITALS — BP 102/52 | HR 81 | Temp 98.1°F | Resp 18 | Ht 66.0 in | Wt 141.2 lb

## 2015-06-12 DIAGNOSIS — C50211 Malignant neoplasm of upper-inner quadrant of right female breast: Secondary | ICD-10-CM

## 2015-06-12 DIAGNOSIS — C8591 Non-Hodgkin lymphoma, unspecified, lymph nodes of head, face, and neck: Secondary | ICD-10-CM

## 2015-06-12 DIAGNOSIS — D801 Nonfamilial hypogammaglobulinemia: Secondary | ICD-10-CM

## 2015-06-12 DIAGNOSIS — I482 Chronic atrial fibrillation, unspecified: Secondary | ICD-10-CM

## 2015-06-12 LAB — CBC WITH DIFFERENTIAL/PLATELET
BASO%: 0.5 % (ref 0.0–2.0)
BASOS ABS: 0 10*3/uL (ref 0.0–0.1)
EOS ABS: 0.4 10*3/uL (ref 0.0–0.5)
EOS%: 6 % (ref 0.0–7.0)
HEMATOCRIT: 32 % — AB (ref 34.8–46.6)
HEMOGLOBIN: 10.3 g/dL — AB (ref 11.6–15.9)
LYMPH#: 1.3 10*3/uL (ref 0.9–3.3)
LYMPH%: 18.2 % (ref 14.0–49.7)
MCH: 27.1 pg (ref 25.1–34.0)
MCHC: 32.2 g/dL (ref 31.5–36.0)
MCV: 84.2 fL (ref 79.5–101.0)
MONO#: 0.6 10*3/uL (ref 0.1–0.9)
MONO%: 8.4 % (ref 0.0–14.0)
NEUT#: 4.9 10*3/uL (ref 1.5–6.5)
NEUT%: 66.9 % (ref 38.4–76.8)
Platelets: 240 10*3/uL (ref 145–400)
RBC: 3.8 10*6/uL (ref 3.70–5.45)
RDW: 15.8 % — AB (ref 11.2–14.5)
WBC: 7.4 10*3/uL (ref 3.9–10.3)

## 2015-06-12 LAB — COMPREHENSIVE METABOLIC PANEL
ALBUMIN: 3.8 g/dL (ref 3.5–5.0)
ALK PHOS: 85 U/L (ref 40–150)
ALT: 20 U/L (ref 0–55)
ANION GAP: 9 meq/L (ref 3–11)
AST: 21 U/L (ref 5–34)
BUN: 19.9 mg/dL (ref 7.0–26.0)
CALCIUM: 10 mg/dL (ref 8.4–10.4)
CO2: 24 mEq/L (ref 22–29)
Chloride: 103 mEq/L (ref 98–109)
Creatinine: 0.9 mg/dL (ref 0.6–1.1)
EGFR: 63 mL/min/{1.73_m2} — AB (ref >90)
Glucose: 123 mg/dl (ref 70–140)
POTASSIUM: 4.2 meq/L (ref 3.5–5.1)
Sodium: 137 mEq/L (ref 136–145)
Total Bilirubin: 0.43 mg/dL (ref 0.20–1.20)
Total Protein: 6.9 g/dL (ref 6.4–8.3)

## 2015-06-12 MED ORDER — HEPARIN SOD (PORK) LOCK FLUSH 100 UNIT/ML IV SOLN
500.0000 [IU] | Freq: Once | INTRAVENOUS | Status: AC | PRN
  Administered 2015-06-12: 500 [IU] via INTRAVENOUS
  Filled 2015-06-12: qty 5

## 2015-06-12 MED ORDER — SODIUM CHLORIDE 0.9 % IJ SOLN
10.0000 mL | INTRAMUSCULAR | Status: DC | PRN
  Administered 2015-06-12: 10 mL via INTRAVENOUS
  Filled 2015-06-12: qty 10

## 2015-06-12 NOTE — Assessment & Plan Note (Signed)
This is due to rituximab. She also had mild infusion reaction with prior IVIG. I plan to reduce the dose to 400 mg/kg 1 dose monthly along with premedication. She will get it tomorrow. She is currently on prophylactic therapy with Keflex to reduce recurrent sepsis

## 2015-06-12 NOTE — Progress Notes (Signed)
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   12/07/2013 Pathology Results Accession: [id number] biopsies show high-grade follicular lymphoma.   01/03/2014 Bone Marrow Biopsy Accession: [id number] Bone marrow biopsy is negative   01/04/2014 Imaging ECHO showed normal EF   01/04/2014 Procedure She has placement of port   01/09/2014 - 03/07/2014 Chemotherapy She was given treatment with bendamustine with rituximab. Treatment was stopped due to severe side-effects despite significant dose adjustment for cycle 2   01/18/2014 - 02/01/2014 Hospital Admission She was admitted to the hospital from Escherichia coli sepsis with multiorgan failure and brief episodes of intubation. She was discharged to skilled nursing facility   02/26/2014 Imaging PET/CT scan showed near complete remission   02/27/2014 Adverse Reaction Cycle 2 of treatment was resumed with drastic dose adjustment to bendamustine due to recent multi-organ failure   04/03/2014 - 03/13/2015 Chemotherapy She is started on maintenance rituximab only.   04/09/2014 - 04/12/2014 Hospital Admission The patient was admitted to the hospital with urinary tract  infection and sepsis.   06/05/2014 Imaging PET CT scan showed complete response to Rx   02/12/2015 Imaging Ct scan showed no evidence of disease. It shows she has new pneumonia   04/02/2015 Miscellaneous She received 1 dose IVIG complicated by mild infusion reaction   04/26/2015 - 04/30/2015 Hospital Admission She had recurrent admission to the hospital with sepsis    Breast cancer of upper-inner quadrant of right female breast ([place])   03/31/2015 Imaging Screening mammogram showed suspicious lesion, confirmed on diagnostic imaging at 2 and 230 position on the right breast   04/10/2015 Pathology Results Accession: [id number] breast biopsy in 2 locations came back invasive ductal carcinoma with calcification, 100% ER and PR positive, HER 2 neg   05/27/2015 Surgery She underwent right total mastectomy    INTERVAL HISTORY: Please see below for problem oriented charting. She returns for further follow-up. She is still healing from her recent mastectomy surgery. Drain is still in situ. She denies new lymphadenopathy. No recent infection. She is on chronic prophylactic therapy with Keflex to prevent recurrent urinary tract infection.  REVIEW OF SYSTEMS:   Constitutional: Denies fevers, chills or abnormal weight loss Eyes: Denies blurriness of vision Ears, nose, mouth, throat, and face: Denies mucositis or sore throat Respiratory: Denies cough, dyspnea or wheezes Cardiovascular: Denies palpitation, chest discomfort or lower extremity swelling Gastrointestinal:  Denies nausea, heartburn or change in bowel habits Skin: Denies abnormal skin rashes Lymphatics: Denies new lymphadenopathy or easy bruising Neurological:Denies numbness, tingling or new weaknesses Behavioral/Psych: Mood is stable, no new changes  All other systems were reviewed with the patient and are negative.  I have reviewed the past  medical history, past surgical history, social history and family history with the patient and they are  unchanged from previous note.  ALLERGIES:  is allergic to morphine and related; multaq; demerol; oysters; penicillins; and sulfa drugs cross reactors.  MEDICATIONS:  Current Outpatient Prescriptions  Medication Sig Dispense Refill  . acetaminophen (TYLENOL) 500 MG tablet Take 1,000 mg by mouth every 6 (six) hours as needed for moderate pain, fever or headache.    . albuterol (PROAIR HFA) 108 (90 BASE) MCG/ACT inhaler Inhale 2 puffs into the lungs every 6 (six) hours as needed for wheezing or shortness of breath.    Marland Kitchen albuterol (PROVENTIL) (2.5 MG/3ML) 0.083% nebulizer solution Inhale 3 mLs into the lungs every 6 (six) hours as needed for wheezing or shortness of breath.     . cephALEXin (KEFLEX) 250 MG capsule Take by mouth daily.    . Cholecalciferol (VITAMIN D) 2000 UNITS tablet Take 2,000 Units by mouth daily with lunch.    . diltiazem (CARDIZEM CD) 120 MG 24 hr capsule Take 120 mg by mouth 2 (two) times daily.     . diphenoxylate-atropine (LOMOTIL) 2.5-0.025 MG per tablet Take 1 tablet by mouth 4 (four) times daily as needed for diarrhea or loose stools. 90 tablet 0  . flecainide (TAMBOCOR) 100 MG tablet Take 100 mg by mouth 2 (two) times daily.     . Fluticasone-Salmeterol (ADVAIR) 250-50 MCG/DOSE AEPB Inhale 2 puffs into the lungs every 12 (twelve) hours.     Marland Kitchen HYDROcodone-acetaminophen (NORCO) 5-325 MG tablet Take 1-2 tablets by mouth every 6 (six) hours as needed. 30 tablet 0  . levothyroxine (SYNTHROID, LEVOTHROID) 200 MCG tablet Take 200 mcg by mouth daily before breakfast.    . lidocaine-prilocaine (EMLA) cream Apply 1 application topically as needed. Apply to [facility] a Cath site at least one hour prior to needle stick as needed. 30 g 3  . Magnesium Oxide 500 MG TABS Take 500 mg by mouth daily.     . metFORMIN (GLUCOPHAGE-XR) 500 MG 24 hr tablet Take 500 mg by mouth 2 (two) times daily with a meal.    . montelukast (SINGULAIR) 10 MG tablet Take 10 mg by mouth daily.    . ondansetron  (ZOFRAN) 8 MG tablet Take 8 mg by mouth every 8 (eight) hours as needed for nausea or vomiting.    . pravastatin (PRAVACHOL) 40 MG tablet Take 20 mg by mouth See admin instructions. Take 1/2 tablet (20 mg) ONLY on Monday, Tuesday, Wednesday, and Thursday Nights.    Marland Kitchen PRESCRIPTION MEDICATION Antibody Plan [facility]    . prochlorperazine (COMPAZINE) 10 MG tablet Take 1 tablet (10 mg total) by mouth every 6 (six) hours as needed for nausea (nausea). 30 tablet 6  . rivaroxaban (XARELTO) 20 MG TABS tablet Take 20 mg by mouth daily with supper.    . traMADol-acetaminophen (ULTRACET) 37.5-325 MG per tablet Take 1 tablet by mouth every 6 (six) hours as needed. (Patient taking differently: Take 1 tablet by mouth every 6 (six) hours as needed for moderate pain. ) 30 tablet 0  . vitamin B-12 (CYANOCOBALAMIN) 500 MCG tablet Take 500 mcg by mouth daily.     No current facility-administered medications for this visit.    PHYSICAL EXAMINATION: ECOG PERFORMANCE STATUS: 1 - Symptomatic but completely ambulatory  Filed Vitals:   06/12/15 0911  BP: 102/52  Pulse: 81  Temp: 98.1 F (36.7 C)  Resp: 18   Filed Weights   06/12/15 0911  Weight: 141 lb  3.2 oz (64.048 kg)    GENERAL:alert, no distress and comfortable SKIN: skin color, texture, turgor are normal, no rashes or significant lesions EYES: normal, Conjunctiva are pink and non-injected, sclera clear OROPHARYNX:no exudate, no erythema and lips, buccal mucosa, and tongue normal  NECK: supple, thyroid normal size, non-tender, without nodularity LYMPH:  no palpable lymphadenopathy in the cervical, axillary or inguinal LUNGS: clear to auscultation and percussion with normal breathing effort HEART: regular rate & rhythm and no murmurs and no lower extremity edema ABDOMEN:abdomen soft, non-tender and normal bowel sounds Musculoskeletal:no cyanosis of digits and no clubbing  NEURO: alert & oriented x 3 with fluent speech, no focal motor/sensory  deficits  LABORATORY DATA:  I have reviewed the data as listed    Component Value Date/Time   NA 137 06/12/2015 0850   NA 137 05/28/2015 0422   K 4.2 06/12/2015 0850   K 4.7 05/28/2015 0422   CL 106 05/28/2015 0422   CO2 24 06/12/2015 0850   CO2 23 05/28/2015 0422   GLUCOSE 123 06/12/2015 0850   GLUCOSE 145* 05/28/2015 0422   BUN 19.9 06/12/2015 0850   BUN 10 05/28/2015 0422   CREATININE 0.9 06/12/2015 0850   CREATININE 0.57 05/28/2015 0422   CALCIUM 10.0 06/12/2015 0850   CALCIUM 9.1 05/28/2015 0422   PROT 6.9 06/12/2015 0850   PROT 7.3 04/26/2015 2141   ALBUMIN 3.8 06/12/2015 0850   ALBUMIN 3.5 04/26/2015 2141   AST 21 06/12/2015 0850   AST 44* 04/26/2015 2141   ALT 20 06/12/2015 0850   ALT 23 04/26/2015 2141   ALKPHOS 85 06/12/2015 0850   ALKPHOS 60 04/26/2015 2141   BILITOT 0.43 06/12/2015 0850   BILITOT 0.5 04/26/2015 2141   GFRNONAA >60 05/28/2015 0422   GFRAA >60 05/28/2015 0422    No results found for: SPEP, UPEP  Lab Results  Component Value Date   WBC 7.4 06/12/2015   NEUTROABS 4.9 06/12/2015   HGB 10.3* 06/12/2015   HCT 32.0* 06/12/2015   MCV 84.2 06/12/2015   PLT 240 06/12/2015      Chemistry      Component Value Date/Time   NA 137 06/12/2015 0850   NA 137 05/28/2015 0422   K 4.2 06/12/2015 0850   K 4.7 05/28/2015 0422   CL 106 05/28/2015 0422   CO2 24 06/12/2015 0850   CO2 23 05/28/2015 0422   BUN 19.9 06/12/2015 0850   BUN 10 05/28/2015 0422   CREATININE 0.9 06/12/2015 0850   CREATININE 0.57 05/28/2015 0422      Component Value Date/Time   CALCIUM 10.0 06/12/2015 0850   CALCIUM 9.1 05/28/2015 0422   ALKPHOS 85 06/12/2015 0850   ALKPHOS 60 04/26/2015 2141   AST 21 06/12/2015 0850   AST 44* 04/26/2015 2141   ALT 20 06/12/2015 0850   ALT 23 04/26/2015 2141   BILITOT 0.43 06/12/2015 0850   BILITOT 0.5 04/26/2015 2141     ASSESSMENT & PLAN:  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 She is currently on prophylactic antibiotic therapy with Keflex I discussed the pathophysiology of hypogammaglobulinemia and I recommend we discontinued rituximab  Her recent CT scan from December 2016 show no evidence of recurrence of lymphoma I plan to observe only and repeat imaging study in June 2017.  Breast cancer of upper-inner quadrant of right female breast [facility]) She is still healing from recent  mastectomy. Final pathology show low-grade carcinoma, strongly ER/PR positive. I will obtain a copy of her most recent bone density scan from her primary care physician and will review it with the patient next month. If she has severe osteoporosis, I will prescribe tamoxifen as her adjuvant treatment. 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 She also had mild infusion reaction with prior IVIG. I plan to reduce the dose to 400 mg/kg 1 dose monthly along with premedication. She will get it tomorrow. She is currently on prophylactic therapy with Keflex to reduce recurrent sepsis  Chronic atrial fibrillation [facility]) She has chronic atrial fibrillation. Currently, she is on chronic anticoagulation therapy. Her cardiologist is planning for cardioversion next month. There is no contraindication for her to proceed from my standpoint.   No orders of the defined types were placed in this encounter.   All questions were answered. The patient knows to call the clinic with any problems, questions or concerns. No barriers to learning was detected. I spent 20 minutes counseling the patient face to face. The total time spent in the appointment was 25 minutes and more than 50% was on counseling and review of test results     [facility], [place], MD 06/12/2015 9:47 AM

## 2015-06-12 NOTE — Assessment & Plan Note (Signed)
The patient had recurrent admission to the hospital with sepsis, due to recurrent UTI. She is currently on prophylactic antibiotic therapy with Keflex I discussed the pathophysiology of hypogammaglobulinemia and I recommend we discontinued rituximab  Her recent CT scan from December 2016 show no evidence of recurrence of lymphoma I plan to observe only and repeat imaging study in June 2017.

## 2015-06-12 NOTE — Assessment & Plan Note (Signed)
She is still healing from recent mastectomy. Final pathology show low-grade carcinoma, strongly ER/PR positive. I will obtain a copy of her most recent bone density scan from her primary care physician and will review it with the patient next month. If she has severe osteoporosis, I will prescribe tamoxifen as her adjuvant treatment. However, if she does not have evidence of osteoporosis, I would consider giving her Arimidex

## 2015-06-12 NOTE — Assessment & Plan Note (Signed)
She has chronic atrial fibrillation. Currently, she is on chronic anticoagulation therapy. Her cardiologist is planning for cardioversion next month. There is no contraindication for her to proceed from my standpoint.

## 2015-06-12 NOTE — Telephone Encounter (Signed)
Gave pt appt for April & may & [facility]

## 2015-06-12 NOTE — Patient Instructions (Signed)

## 2015-06-12 NOTE — Telephone Encounter (Signed)
Called pt to discuss survivorship care plan and program. Pt wishes to receive care plan in mail and may choose to participate in the program at a later date. Encourage pt to call with needs. Relate doing well and without complaints.

## 2015-06-13 ENCOUNTER — Ambulatory Visit (HOSPITAL_BASED_OUTPATIENT_CLINIC_OR_DEPARTMENT_OTHER): Payer: Medicare Other

## 2015-06-13 VITALS — BP 107/50 | HR 77 | Temp 98.4°F | Resp 18

## 2015-06-13 DIAGNOSIS — D801 Nonfamilial hypogammaglobulinemia: Secondary | ICD-10-CM

## 2015-06-13 DIAGNOSIS — C8591 Non-Hodgkin lymphoma, unspecified, lymph nodes of head, face, and neck: Secondary | ICD-10-CM

## 2015-06-13 LAB — IGG: IgG, Qn, Serum: 817 mg/dL (ref 700–1600)

## 2015-06-13 MED ORDER — SODIUM CHLORIDE 0.9% FLUSH
10.0000 mL | INTRAVENOUS | Status: DC | PRN
  Administered 2015-06-13: 10 mL via INTRAVENOUS
  Filled 2015-06-13: qty 10

## 2015-06-13 MED ORDER — DEXAMETHASONE SODIUM PHOSPHATE 100 MG/10ML IJ SOLN
10.0000 mg | Freq: Once | INTRAMUSCULAR | Status: AC
  Administered 2015-06-13: 10 mg via INTRAVENOUS
  Filled 2015-06-13: qty 1

## 2015-06-13 MED ORDER — DIPHENHYDRAMINE HCL 25 MG PO CAPS
ORAL_CAPSULE | ORAL | Status: AC
  Filled 2015-06-13: qty 1

## 2015-06-13 MED ORDER — DEXTROSE 5 % IV SOLN
INTRAVENOUS | Status: DC
  Administered 2015-06-13: 11:00:00 via INTRAVENOUS

## 2015-06-13 MED ORDER — DIPHENHYDRAMINE HCL 25 MG PO CAPS
25.0000 mg | ORAL_CAPSULE | Freq: Once | ORAL | Status: AC
  Administered 2015-06-13: 25 mg via ORAL

## 2015-06-13 MED ORDER — HEPARIN SOD (PORK) LOCK FLUSH 100 UNIT/ML IV SOLN
500.0000 [IU] | Freq: Once | INTRAVENOUS | Status: AC
  Administered 2015-06-13: 500 [IU] via INTRAVENOUS
  Filled 2015-06-13: qty 5

## 2015-06-13 MED ORDER — IMMUNE GLOBULIN (HUMAN) 10 GM/100ML IV SOLN
400.0000 mg/kg | Freq: Once | INTRAVENOUS | Status: AC
  Administered 2015-06-13: 25 g via INTRAVENOUS
  Filled 2015-06-13: qty 250

## 2015-06-13 NOTE — Patient Instructions (Signed)

## 2015-06-17 ENCOUNTER — Encounter: Payer: Self-pay | Admitting: Nurse Practitioner

## 2015-06-17 DIAGNOSIS — E039 Hypothyroidism, unspecified: Secondary | ICD-10-CM | POA: Diagnosis not present

## 2015-06-17 DIAGNOSIS — C50211 Malignant neoplasm of upper-inner quadrant of right female breast: Secondary | ICD-10-CM

## 2015-06-17 NOTE — Progress Notes (Signed)
The Survivorship Care Plan was mailed to Ms. Vanderwoude as she reported not being able to come in to the Survivorship Clinic for an in-person visit at this time. A letter was mailed to her outlining the purpose of the content of the care plan, as well as encouraging her to reach out to me with any questions or concerns.  My business card was included in the correspondence to the patient as well.  A copy of the care plan was also routed/faxed/mailed to Horatio Pel, MD, the patient's PCP.  I will not be placing any follow-up appointments to the Survivorship Clinic for Ms. Ham, but I am happy to see her at any time in the future for any survivorship concerns that may arise. Thank you for allowing me to participate in her care!  Kenn File, [place] [phone]

## 2015-06-17 NOTE — H&P (Signed)
OFFICE VISIT NOTES COPIED TO EPIC FOR DOCUMENTATION  Patty [name] 06/28/2015 11:15 AM Location: [facility] Patient #: [id number] DOB: September 08, 1940 Married / Language: English / Race: White Female   History of Present Illness Patty Page MD; 06-28-15 11:57 AM) Patient words: Last OV 05/16/15; 2 week FU for aflutter.  The patient is a 75 year old female who presents for a follow-up for Atrial fibrillation. She has history of paroxysmal atrial fibrillation, diabetes mellitus, history of bronchial asthma and central sleep apnea, uses CPAP on a regular basis. She presented on 11/25/2015 for annual follow-up and was noted to be back in a.fib. She was started on flecainide and scheduled for cardioversion, however, on presentation to the hospital, was back in sinus rhythm.  She was admitted on 04/26/2015 with fever, chills, and altered mental statusand sepsis. She was discharged home on 04/30/2015. Despite resolution of fever and symptoms, heart rate remained elevated around 110 on discharge was found to be in atrial flutter with 2:1 conduction at a rate of 100 bpm. She underwentright sided mastectomy on May 27, 2015 without any problems. She now presents here for follow-up, thinks that she is probably back in sinus rhythm as her energy level is improved and palpitations have resolved.  She denies any PND or orthopnea. She denies any chest pain or palpitations. No visual disturbances, nausea or vomiting.   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 Fibrillation. Stable. 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 Recommended cranberry supplement to prevent recurrent UTI. Discuss with cardiology regarding interaction with warfarin. 03/13/2015: Creatinine 0.8, potassium 4.4, CMP normal, HB 9.9/HCT 30.1 Hypothyroidism, adult (E03.9) Paroxysmal atrial fibrillation (I48.0)05/27/2010 CHA2DS2-VASc Score is 5 with yearly risk of stroke of 6.7 %. Bleeding risk is 1 %/year. Rec: [place] [facility] cath 2010 Mild coronary calcification, Normal EF. Stress cardiolite 3/12 [facility]) no ischemia . Admitted to [place] in March 2012 for A. Fibrillation with RVR. Echo 04/08/11: Normal LVEF. Moderate LVH. Moderate LA enlargement at 4.5cm.  Allergies [name]; 06-28-15 11:12 AM) Demerol *ANALGESICS - OPIOID* Vomiting. Penicillin V *PENICILLINS* Rash. SulfADIAZINE *SULFONAMIDES* Rash. Multaq *ANTIARRHYTHMICS* Rash. Penicillamine *ASSORTED CLASSES*  Family History [name]; 2015-06-28 11:12 AM) Mother Deceased. at age 30, from pneumonia; known CHF, h/o of MI at age 73. Father Deceased. at age 24, from a Stroke; h/o MI at age 68, HTN Brother 1 Deceased. at age 40 from MI; no other heart attacks or strokes, no other cardiovascular conditions; 10 yrs older Sister 1 Deceased. at age 14 from cancer; stents and a pacemaker; no strokes or heart attacks, no other cardiovascular conditions; 6 yrs older  Social History [name]; 28-Jun-2015 11:12 AM) Current tobacco use Never smoker. Non Drinker/No Alcohol Use Marital status Married. Number of Children 2. Living Situation Lives with spouse.  Past Surgical History [name]; 06-28-15 11:20 AM) Tonsillectomy 1950. Appendectomy  1953 Hysterectomy in the 70's.  2 back surgeries in 1984 and 1986.  Cholecystectomy 1998.  Cataract surgery in both eyes 1992.  Mastectomy; Total - Right03/28/2017  Medication History [name]; 06/06/2015 11:25 AM) DilTIAZem HCl ER (120MG Capsule ER 24HR, 1 (one) Capsul Capsule ER 24H Oral twice a day, Taken starting 06/05/2015) Active. Flecainide Acetate (100MG Tablet, 1 (one) Tablet Tablet Oral two times daily, Taken starting 05/06/2015) Active. (Increase to 100 mb BID) Xarelto (20MG Tablet, 1 (one) Tablet Tablet Tablet Oral every evening with dinner, Taken starting 04/14/2015) Active. Keflex (250MG Capsule, 1 Oral at bedtime, Taken starting 05/07/2015) Active. Singulair (10MG Tablet, 1 Oral daily) Active. Advair Diskus (250-50MCG/DOSE Aero Pow Br Act, 2 puffs Inhalation daily) Active. Vitamin D3 (2000UNIT Capsule, 1 Oral daily) Active. Hydrochlorothiazide (25MG Tablet, Oral as needed) Active. Tramadol-Acetaminophen (37.5-325MG Tablet, Oral as needed) Active. Magnesium Oxide (500MG Tablet, 1 Oral daily) Active. MetFORMIN HCl ER (500MG Tablet ER 24HR, 1 Oral two times daily, with food) Active. Pravastatin Sodium (40MG Tablet, 1/2 tablet Oral four days a week) Active. Diphenoxylate-Atropine (2.5-0.025MG Tablet, 1 Oral as needed for diarrhea) Active. Zofran ODT (8MG Tablet Disperse, 1 Oral every 8 hours as needed for nausea) Active. ProAir HFA (108 (90 Base)MCG/ACT Aerosol Soln, 2 puffs Inhalation every 6 hours as needed) Active. Compazine (10MG Tablet, 1 Oral every 6 hours as needed for nausea) Active. Synthroid (200MCG Tablet, 1 Oral daily) Active. (Dr Shelia Media changed due to elevated heart rate) Lovaza (1GM Capsule, 2 Oral daily) Active. Medications Reconciled (list present)  Diagnostic Studies History [name]; 06/06/2015 11:12 AM) Colonoscopy 2010 (normal).  Echo 04/08/11: Normal LVEF. Moderate LVH. Moderate LA enlargement at 4.5cm. ECG 11/18/11:  Sinus, 1st degree AV block, RBBB. Low voltage. Inferior ST-T change, cannot R/O ischemi Cardioversion 04/13/11 Sleep study 2004 (Dx'd with sleep apnea-sleeps with CPAP every night).  Labwork 04/30/2015: TSH 4.198 04/29/2015: HB 10.2/HCT 30 with normocytic indices, serum glucose 132, creatinine 0.72, potassium 3.6, sodium 133, magnesium 1.5 04/25/2015: HbA1c 5.5% 02/12/2015: Serum glucose 124, creatinine 0.8, potassium 4.4, CMP otherwise normal, WBC 18.8 (PNA), HB 10.1/HCT 31.6, PLT normal 12/19/2014: Serum glucose 121 mg, nonfasting. BUN 17, serum creatinine 0.73, potassium 4.5. 08/15/2014: HB 11.1/HCT 34.8 with normocytic indices, serum glucose 114, creatinine 0.9, potassium 4.5, CMP otherwise normal, HbA1c 6.0%, total cholesterol 100, triglycerides 124, HDL 30, LDL 45 11/26/2013: BUN 16, serum creatinine 0.55, EGFR 89 mL. HB 10.7/HCT 33.4, normal indicis. Stable CBC with a past 3 years. CT Scan of Chest12/14/2016 Atherosclerotic calcification of the arterial vasculature without AAA. New peribronchovesicular ground glass and nodular consolidation in both lower lobes, most indicative of bronchopneumonia.  Other Problems [name]; 06/06/2015 11:12 AM) Heart cath 2010 (no stents-Dr. Peter Martinique) Mild coronary calcification, Normal EF. Admitted to [place] in March 2012 for A. Fibrillation with RVR. Stress cardiolite 3/12 [facility]) no ischemia . Echo 2012 mild LVH. Normal EF.     Review of Systems Patty Page MD; 06/06/2015 11:57 AM) General Not Present- Fatigue, Fever and Night Sweats. Respiratory Present- Difficulty Breathing on Exertion (chronic and improved since last OV). Not Present- Chronic Cough, Hemoptysis and Wakes up from Sleep Wheezing or Short of Breath. Cardiovascular Not Present- Chest Pain, Claudications, Fainting, Orthopnea, Paroxysmal Nocturnal Dyspnea and Swelling of Extremities. Gastrointestinal Not Present- Abdominal Pain, Constipation, Diarrhea, Nausea and  Vomiting. Musculoskeletal Present- Joint Pain (knee and ankle) and Joint Stiffness. Neurological Not Present- Dizziness, Focal Neurological Symptoms, Headaches and Syncope. Hematology Not Present- Blood Clots, Easy Bruising and Nose Bleed. All other systems negative  Vitals [name]; 06/06/2015 11:27 AM) 06/06/2015 11:15 AM Weight: 141 lb Height: 66.5in Body Surface Area: 1.73  m Body Mass Index: 22.42 kg/m  Pulse: 93 (Regular)  P.OX: 98% (Room air) BP: 117/67 (Sitting, Left Arm, Standard)       Physical Exam Patty Page MD; 06/06/2015 11:55 AM) General Mental Status-Alert. General Appearance-Cooperative, Appears stated age, Not in acute distress. Orientation-Oriented X3. Build & Nutrition-Well nourished and Moderately built.  Head and Neck Thyroid Gland Characteristics - no palpable nodules, no palpable enlargement.  Chest and Lung Exam Chest and lung exam reveals -normal excursion with symmetric chest walls and quiet, even and easy respiratory effort with no use of accessory muscles. Palpation Tender - No chest wall tenderness.  Cardiovascular Cardiovascular examination reveals -carotid auscultation reveals no bruits, femoral artery auscultation bilaterally reveals normal pulses, no bruits, no thrills, normal pedal pulses bilaterally and no digital clubbing, cyanosis, edema, increased warmth or tenderness. Inspection Jugular vein - Right - No Distention. Auscultation Rhythm - Regular. Heart Sounds - S1 WNL, S2 WNL and No gallop present. Murmurs & Other Heart Sounds: Murmur - Location - Aortic Area and Apex. Timing - Early systolic. Grade - I/VI.  Abdomen Palpation/Percussion Normal exam - Non Tender and No hepatosplenomegaly. Auscultation Normal exam - Bowel sounds normal.  Neurologic Motor-Grossly intact without any focal deficits.  Musculoskeletal Global Assessment Left Lower Extremity - normal range of motion without pain.  Right Lower Extremity - normal range of motion without pain.    Assessment & Plan Patty Page MD; 06/06/2015 5:28 PM)  Paroxysmal atrial fibrillation (I48.0) Story: CHA2DS2-VASc Score is 5 with yearly risk of stroke of 6.7 %. Bleeding risk is 1 %/year. Rec: [place]  Heart cath 2010 Mild coronary calcification, Normal EF.  Stress cardiolite 3/12 [facility]) no ischemia . Admitted to [place] in March 2012 for A. Fibrillation with RVR.  Echo 04/08/11: Normal LVEF. Moderate LVH. Moderate LA enlargement at 4.5cm. Impression: EKG 06/06/2015: Atypical atrial flutter with 2; 1 conduction at rate of 89 bpm, leftward axis, right branch block. Poor R-wave progression, cannot exclude anteroseptal infarct old. No significant change from EKG 05/16/2015.  EKG 01/02/2015: Sinus rhythm at a rate of 89 bpm, left axis deviation, left anterior fascicular block, poor R-wave progression, cannot exclude anterior infarct old, low voltage complexes, nonspecific T abnormality, pulmonary disease pattern. Normal QT  EKG 11/28/2014: Atrial flutter with 4:1 AV conduction, V rate 70/min, leftward axis, poor R-wave progression, anterior infarct old. Nonspecific T abnormality. Current Plans Complete electrocardiogram (93000) Future Plans 6/96/7893: METABOLIC PANEL, BASIC (81017) - one time  Essential hypertension, benign (I10)  Current Plans Mechanism of underlying disease process and action of medications discussed with the patient. She presents for two-week follow-up of atrial flutter. Patient underwent successful mastectomy on 05/27/2015, was reinitiated back on Xarelto the following day. She is presently doing well and is still in persistent atypical A. Flutter. Continue present dose of flecainide and also diltiazem as she is tolerating this well, otherwise no changes in her medications were done today, set her for cardioversion.   Signed by Patty Page, MD (06/06/2015 5:28 PM)

## 2015-06-18 ENCOUNTER — Encounter (HOSPITAL_COMMUNITY)
Admission: RE | Disposition: A | Discharge status: Home / Self Care | Payer: Self-pay | Source: Ambulatory Visit | Attending: Cardiology

## 2015-06-18 ENCOUNTER — Ambulatory Visit (HOSPITAL_COMMUNITY): Payer: Medicare Other | Admitting: Certified Registered Nurse Anesthetist

## 2015-06-18 ENCOUNTER — Encounter (HOSPITAL_COMMUNITY): Payer: Self-pay | Admitting: *Deleted

## 2015-06-18 ENCOUNTER — Ambulatory Visit (HOSPITAL_COMMUNITY)
Admission: RE | Admit: 2015-06-18 | Discharge: 2015-06-18 | Disposition: A | Discharge status: Home / Self Care | Payer: Medicare Other | Source: Ambulatory Visit | Attending: Cardiology | Admitting: Cardiology

## 2015-06-18 ENCOUNTER — Other Ambulatory Visit: Payer: Self-pay | Admitting: *Deleted

## 2015-06-18 DIAGNOSIS — I4891 Unspecified atrial fibrillation: Secondary | ICD-10-CM | POA: Diagnosis present

## 2015-06-18 DIAGNOSIS — Z7901 Long term (current) use of anticoagulants: Secondary | ICD-10-CM | POA: Diagnosis not present

## 2015-06-18 DIAGNOSIS — G4730 Sleep apnea, unspecified: Secondary | ICD-10-CM | POA: Diagnosis not present

## 2015-06-18 DIAGNOSIS — E039 Hypothyroidism, unspecified: Secondary | ICD-10-CM | POA: Insufficient documentation

## 2015-06-18 DIAGNOSIS — I484 Atypical atrial flutter: Secondary | ICD-10-CM | POA: Insufficient documentation

## 2015-06-18 DIAGNOSIS — I480 Paroxysmal atrial fibrillation: Secondary | ICD-10-CM | POA: Insufficient documentation

## 2015-06-18 DIAGNOSIS — I10 Essential (primary) hypertension: Secondary | ICD-10-CM | POA: Insufficient documentation

## 2015-06-18 DIAGNOSIS — E119 Type 2 diabetes mellitus without complications: Secondary | ICD-10-CM | POA: Diagnosis not present

## 2015-06-18 DIAGNOSIS — Z9011 Acquired absence of right breast and nipple: Secondary | ICD-10-CM | POA: Diagnosis not present

## 2015-06-18 DIAGNOSIS — N189 Chronic kidney disease, unspecified: Secondary | ICD-10-CM | POA: Diagnosis not present

## 2015-06-18 DIAGNOSIS — I129 Hypertensive chronic kidney disease with stage 1 through stage 4 chronic kidney disease, or unspecified chronic kidney disease: Secondary | ICD-10-CM | POA: Diagnosis not present

## 2015-06-18 HISTORY — PX: CARDIOVERSION: SHX1299

## 2015-06-18 LAB — GLUCOSE, CAPILLARY: Glucose-Capillary: 97 mg/dL (ref 65–99)

## 2015-06-18 SURGERY — CARDIOVERSION
Anesthesia: General

## 2015-06-18 MED ORDER — SODIUM CHLORIDE 0.9 % IV SOLN
250.0000 mL | INTRAVENOUS | Status: DC
  Administered 2015-06-18: 500 mL via INTRAVENOUS

## 2015-06-18 MED ORDER — LIDOCAINE HCL (CARDIAC) 20 MG/ML IV SOLN
INTRAVENOUS | Status: DC | PRN
  Administered 2015-06-18: 40 mg via INTRATRACHEAL

## 2015-06-18 MED ORDER — LIDOCAINE-PRILOCAINE 2.5-2.5 % EX CREA: 1.0000 "application " | TOPICAL_CREAM | CUTANEOUS | Status: DC | PRN

## 2015-06-18 MED ORDER — SODIUM CHLORIDE 0.9% FLUSH: 3.0000 mL | INTRAVENOUS | Status: DC | PRN

## 2015-06-18 MED ORDER — PROPOFOL 10 MG/ML IV BOLUS
INTRAVENOUS | Status: DC | PRN
  Administered 2015-06-18: 40 mg via INTRAVENOUS

## 2015-06-18 MED ORDER — SODIUM CHLORIDE 0.9 % IV SOLN: 250.0000 mL | INTRAVENOUS | Status: DC

## 2015-06-18 MED ORDER — FLECAINIDE ACETATE 100 MG PO TABS: 100.0000 mg | ORAL_TABLET | Freq: Every morning | ORAL | Status: DC

## 2015-06-18 MED ORDER — SODIUM CHLORIDE 0.9% FLUSH: 3.0000 mL | Freq: Two times a day (BID) | INTRAVENOUS | Status: DC

## 2015-06-18 NOTE — Anesthesia Preprocedure Evaluation (Addendum)
Anesthesia Evaluation  Patient identified by MRN, date of birth, ID band Patient awake    Reviewed: Allergy & Precautions, NPO status , Patient's Chart, lab work & pertinent test results  Airway Mallampati: I  TM Distance: >3 FB Neck ROM: Full    Dental   Pulmonary asthma , sleep apnea ,    Pulmonary exam normal  + decreased breath sounds      Cardiovascular hypertension, + dysrhythmias Atrial Fibrillation  Rhythm:Irregular Rate:Abnormal     Neuro/Psych    GI/Hepatic   Endo/Other  diabetes, Type 2Hypothyroidism   Renal/GU Renal disease     Musculoskeletal  (+) Arthritis ,   Abdominal   Peds  Hematology  (+) anemia ,   Anesthesia Other Findings   Reproductive/Obstetrics                            Anesthesia Physical Anesthesia Plan  ASA: III  Anesthesia Plan: General   Post-op Pain Management:    Induction: Intravenous  Airway Management Planned: Mask  Additional Equipment:   Intra-op Plan:   Post-operative Plan:   Informed Consent: I have reviewed the patients History and Physical, chart, labs and discussed the procedure including the risks, benefits and alternatives for the proposed anesthesia with the patient or authorized representative who has indicated his/her understanding and acceptance.     Plan Discussed with: Anesthesiologist, CRNA and Surgeon  Anesthesia Plan Comments:         Anesthesia Quick Evaluation

## 2015-06-18 NOTE — Transfer of Care (Signed)
Immediate Anesthesia Transfer of Care Note  Patient: Patty [name]  Procedure(s) Performed: Procedure(s): CARDIOVERSION (N/A)  Patient Location: Endoscopy Unit  Anesthesia Type:General  Level of Consciousness: awake, alert  and oriented  Airway & Oxygen Therapy: Patient Spontanous Breathing  Post-op Assessment: Report given to RN and Post -op Vital signs reviewed and stable  Post vital signs: Reviewed and stable  Last Vitals:  Filed Vitals:   06/18/15 1111  BP: 143/76  Pulse: 82  Temp: 36.9 C  Resp: 16    Complications: No apparent anesthesia complications

## 2015-06-18 NOTE — CV Procedure (Signed)
Direct current cardioversion:  Indication symptomatic A. Fibrillation/atypical atrial flutter.  Procedure: Using 40 mg of IV Propofol and 40 IV Lidocaine (for reducing venous pain) for achieving deep sedation, synchronized direct current cardioversion performed. Patient was delivered with 50 Joules of electricity X 1 with success to SR with first degree AV Block. Patient tolerated the procedure well. No immediate complication noted.

## 2015-06-18 NOTE — Interval H&P Note (Signed)
History and Physical Interval Note:  06/18/2015 12:13 PM  Patty [name]  has presented today for surgery, with the diagnosis of afib  The various methods of treatment have been discussed with the patient and family. After consideration of risks, benefits and other options for treatment, the patient has consented to  Procedure(s): CARDIOVERSION (N/A) as a surgical intervention .  The patient's history has been reviewed, patient examined, no change in status, stable for surgery.  I have reviewed the patient's chart and labs.  Questions were answered to the patient's satisfaction.     Adrian Prows

## 2015-06-18 NOTE — Anesthesia Postprocedure Evaluation (Signed)
Anesthesia Post Note  Patient: Patty [name]  Procedure(s) Performed: Procedure(s) (LRB): CARDIOVERSION (N/A)  Patient location during evaluation: Endoscopy Anesthesia Type: General Level of consciousness: awake and alert and oriented Pain management: pain level controlled Vital Signs Assessment: post-procedure vital signs reviewed and stable Respiratory status: spontaneous breathing Cardiovascular status: blood pressure returned to baseline and stable Postop Assessment: no headache and no signs of nausea or vomiting Anesthetic complications: no    Last Vitals:  Filed Vitals:   06/18/15 1111  BP: 143/76  Pulse: 82  Temp: 36.9 C  Resp: 16    Last Pain: There were no vitals filed for this visit.               [place] Pink

## 2015-06-18 NOTE — Discharge Instructions (Signed)
Electrical Cardioversion, Care After °Refer to this sheet in the next few weeks. These instructions provide you with information on caring for yourself after your procedure. Your health care provider may also give you more specific instructions. Your treatment has been planned according to current medical practices, but problems sometimes occur. Call your health care provider if you have any problems or questions after your procedure. °WHAT TO EXPECT AFTER THE PROCEDURE °After your procedure, it is typical to have the following sensations: °· Some redness on the skin where the shocks were delivered. If this is tender, a sunburn lotion or hydrocortisone cream may help. °· Possible return of an abnormal heart rhythm within hours or days after the procedure. °HOME CARE INSTRUCTIONS °· Take medicines only as directed by your health care provider. Be sure you understand how and when to take your medicine. °· Learn how to feel your pulse and check it often. °· Limit your activity for 48 hours after the procedure or as directed by your health care provider. °· Avoid or minimize caffeine and other stimulants as directed by your health care provider. °SEEK MEDICAL CARE IF: °· You feel like your heart is beating too fast or your pulse is not regular. °· You have any questions about your medicines. °· You have bleeding that will not stop. °SEEK IMMEDIATE MEDICAL CARE IF: °· You are dizzy or feel faint. °· It is hard to breathe or you feel short of breath. °· There is a change in discomfort in your chest. °· Your speech is slurred or you have trouble moving an arm or leg on one side of your body. °· You get a serious muscle cramp that does not go away. °· Your fingers or toes turn cold or blue. °  °This information is not intended to replace advice given to you by your health care provider. Make sure you discuss any questions you have with your health care provider. °  °Document Released: 12/06/2012 Document Revised: 03/08/2014  Document Reviewed: 12/06/2012 °Elsevier Interactive Patient Education ©2016 [employer] ° °

## 2015-06-19 ENCOUNTER — Encounter (HOSPITAL_COMMUNITY): Payer: Self-pay | Admitting: Cardiology

## 2015-06-27 DIAGNOSIS — I480 Paroxysmal atrial fibrillation: Secondary | ICD-10-CM | POA: Diagnosis not present

## 2015-06-27 DIAGNOSIS — I4892 Unspecified atrial flutter: Secondary | ICD-10-CM | POA: Diagnosis not present

## 2015-06-27 DIAGNOSIS — I10 Essential (primary) hypertension: Secondary | ICD-10-CM | POA: Diagnosis not present

## 2015-07-11 ENCOUNTER — Telehealth: Payer: Self-pay | Admitting: Hematology and Oncology

## 2015-07-11 ENCOUNTER — Ambulatory Visit: Payer: Medicare Other

## 2015-07-11 ENCOUNTER — Other Ambulatory Visit (HOSPITAL_BASED_OUTPATIENT_CLINIC_OR_DEPARTMENT_OTHER): Payer: Medicare Other

## 2015-07-11 ENCOUNTER — Ambulatory Visit (HOSPITAL_BASED_OUTPATIENT_CLINIC_OR_DEPARTMENT_OTHER): Payer: Medicare Other | Admitting: Hematology and Oncology

## 2015-07-11 ENCOUNTER — Encounter: Payer: Self-pay | Admitting: Hematology and Oncology

## 2015-07-11 ENCOUNTER — Ambulatory Visit (HOSPITAL_BASED_OUTPATIENT_CLINIC_OR_DEPARTMENT_OTHER): Payer: Medicare Other

## 2015-07-11 VITALS — BP 112/59 | HR 79 | Temp 97.9°F | Resp 16

## 2015-07-11 VITALS — BP 116/62 | HR 80 | Temp 98.2°F | Resp 17 | Ht 66.0 in | Wt 145.4 lb

## 2015-07-11 DIAGNOSIS — C50211 Malignant neoplasm of upper-inner quadrant of right female breast: Secondary | ICD-10-CM

## 2015-07-11 DIAGNOSIS — I482 Chronic atrial fibrillation, unspecified: Secondary | ICD-10-CM

## 2015-07-11 DIAGNOSIS — C8591 Non-Hodgkin lymphoma, unspecified, lymph nodes of head, face, and neck: Secondary | ICD-10-CM

## 2015-07-11 DIAGNOSIS — D801 Nonfamilial hypogammaglobulinemia: Secondary | ICD-10-CM

## 2015-07-11 DIAGNOSIS — D638 Anemia in other chronic diseases classified elsewhere: Secondary | ICD-10-CM

## 2015-07-11 DIAGNOSIS — M8580 Other specified disorders of bone density and structure, unspecified site: Secondary | ICD-10-CM | POA: Diagnosis not present

## 2015-07-11 LAB — CBC WITH DIFFERENTIAL/PLATELET
BASO%: 0.3 % (ref 0.0–2.0)
BASOS ABS: 0 10*3/uL (ref 0.0–0.1)
EOS ABS: 0.2 10*3/uL (ref 0.0–0.5)
EOS%: 2.6 % (ref 0.0–7.0)
HEMATOCRIT: 30.5 % — AB (ref 34.8–46.6)
HEMOGLOBIN: 10 g/dL — AB (ref 11.6–15.9)
LYMPH%: 21 % (ref 14.0–49.7)
MCH: 28 pg (ref 25.1–34.0)
MCHC: 32.8 g/dL (ref 31.5–36.0)
MCV: 85.4 fL (ref 79.5–101.0)
MONO#: 0.6 10*3/uL (ref 0.1–0.9)
MONO%: 10.7 % (ref 0.0–14.0)
NEUT%: 65.4 % (ref 38.4–76.8)
NEUTROS ABS: 3.8 10*3/uL (ref 1.5–6.5)
PLATELETS: 194 10*3/uL (ref 145–400)
RBC: 3.57 10*6/uL — ABNORMAL LOW (ref 3.70–5.45)
RDW: 16.9 % — AB (ref 11.2–14.5)
WBC: 5.9 10*3/uL (ref 3.9–10.3)
lymph#: 1.2 10*3/uL (ref 0.9–3.3)

## 2015-07-11 LAB — COMPREHENSIVE METABOLIC PANEL
ALBUMIN: 4 g/dL (ref 3.5–5.0)
ALK PHOS: 73 U/L (ref 40–150)
ALT: 21 U/L (ref 0–55)
AST: 19 U/L (ref 5–34)
Anion Gap: 12 mEq/L — ABNORMAL HIGH (ref 3–11)
BILIRUBIN TOTAL: 0.32 mg/dL (ref 0.20–1.20)
BUN: 18.9 mg/dL (ref 7.0–26.0)
CO2: 25 meq/L (ref 22–29)
Calcium: 9.8 mg/dL (ref 8.4–10.4)
Chloride: 103 mEq/L (ref 98–109)
Creatinine: 0.9 mg/dL (ref 0.6–1.1)
EGFR: 59 mL/min/{1.73_m2} — ABNORMAL LOW (ref >90)
Glucose: 138 mg/dl (ref 70–140)
Potassium: 3.7 mEq/L (ref 3.5–5.1)
SODIUM: 140 meq/L (ref 136–145)
TOTAL PROTEIN: 7 g/dL (ref 6.4–8.3)

## 2015-07-11 MED ORDER — SODIUM CHLORIDE 0.9 % IJ SOLN
10.0000 mL | INTRAMUSCULAR | Status: DC | PRN
  Administered 2015-07-11: 10 mL via INTRAVENOUS
  Filled 2015-07-11: qty 10

## 2015-07-11 MED ORDER — ANASTROZOLE 1 MG PO TABS: 1.0000 mg | ORAL_TABLET | Freq: Every day | ORAL | Status: DC

## 2015-07-11 MED ORDER — IMMUNE GLOBULIN (HUMAN) 10 GM/100ML IV SOLN
400.0000 mg/kg | Freq: Once | INTRAVENOUS | Status: AC
  Administered 2015-07-11: 25 g via INTRAVENOUS
  Filled 2015-07-11: qty 200

## 2015-07-11 MED ORDER — DIPHENHYDRAMINE HCL 25 MG PO CAPS
25.0000 mg | ORAL_CAPSULE | Freq: Once | ORAL | Status: AC
  Administered 2015-07-11: 25 mg via ORAL

## 2015-07-11 MED ORDER — SODIUM CHLORIDE 0.9 % IV SOLN
10.0000 mg | Freq: Once | INTRAVENOUS | Status: AC
  Administered 2015-07-11: 10 mg via INTRAVENOUS
  Filled 2015-07-11: qty 1

## 2015-07-11 MED ORDER — DIPHENHYDRAMINE HCL 25 MG PO CAPS
ORAL_CAPSULE | ORAL | Status: AC
  Filled 2015-07-11: qty 1

## 2015-07-11 MED ORDER — HEPARIN SOD (PORK) LOCK FLUSH 100 UNIT/ML IV SOLN
500.0000 [IU] | Freq: Once | INTRAVENOUS | Status: AC | PRN
  Administered 2015-07-11: 500 [IU] via INTRAVENOUS
  Filled 2015-07-11: qty 5

## 2015-07-11 NOTE — Patient Instructions (Signed)

## 2015-07-11 NOTE — Telephone Encounter (Signed)
per pof to sch pt appt-gave pt copy of avs °

## 2015-07-11 NOTE — Progress Notes (Signed)
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   01/03/2014 Bone Marrow Biopsy Accession: [id number] Bone marrow biopsy is negative   01/04/2014 Imaging ECHO showed normal EF   01/04/2014 Procedure She has placement of port   01/09/2014 - 03/07/2014 Chemotherapy She was given treatment with bendamustine with rituximab. Treatment was stopped due to severe side-effects despite significant dose adjustment for cycle 2   01/18/2014 - 02/01/2014 Hospital Admission She was admitted to the hospital from Escherichia coli sepsis with multiorgan failure and brief episodes of intubation. She was discharged to skilled nursing facility   02/26/2014 Imaging PET/CT scan showed near complete remission   02/27/2014 Adverse Reaction Cycle 2 of treatment was resumed with  drastic dose adjustment to bendamustine due to recent multi-organ failure   04/03/2014 - 03/13/2015 Chemotherapy She is started on maintenance rituximab only.   04/09/2014 - 04/12/2014 Hospital Admission The patient was admitted to the hospital with urinary tract infection and sepsis.   06/05/2014 Imaging PET CT scan showed complete response to Rx   02/12/2015 Imaging Ct scan showed no evidence of disease. It shows she has new pneumonia   04/02/2015 Miscellaneous She received 1 dose IVIG complicated by mild infusion reaction   04/26/2015 - 04/30/2015 Hospital Admission She had recurrent admission to the hospital with sepsis    Breast cancer of upper-inner quadrant of right female breast ([place])   11/06/2014 Imaging DEXA scan showed osteopenia T-1.9 on bilateral femoral neck   03/31/2015 Imaging Screening mammogram showed suspicious lesion, confirmed on diagnostic imaging at 2 and 230 position on the right breast   04/10/2015 Pathology Results Accession: [id number] breast biopsy in 2 locations came back invasive ductal carcinoma with calcification, 100% ER and PR positive, HER 2 neg   04/10/2015 Clinical Stage Stage IA: T1 N0   05/27/2015 Surgery Right total mastectomy: multifocal IDC, 1.5 and 1.0 cm, neg margins, ER+ (100% - both); PR+ (100% and 95%), HER2neu negative (ratio 1.69 and 1.14) Ki67 2% and 10%. DCIS   05/27/2015 Pathologic Stage Stage IA: mpT1c pNx pMx   06/17/2015 Survivorship Survivorship care plan mailed to patient at her request    INTERVAL HISTORY: Please see below for problem oriented charting. She returns for further follow-up. Unfortunately, recent DC cardioversion was unsuccessful for long-term control of atrial fibrillation She denies new lymphadenopathy Denies recent infection No new pain She remain on chronic anticoagulation therapy and denies  bleeding complications She is compliant taking calcium and vitamin D supplement for osteopenia The site from recent breast surgery has completely  healed  REVIEW OF SYSTEMS:   Constitutional: Denies fevers, chills or abnormal weight loss Eyes: Denies blurriness of vision Ears, nose, mouth, throat, and face: Denies mucositis or sore throat Respiratory: Denies cough, dyspnea or wheezes Cardiovascular: Denies palpitation, chest discomfort or lower extremity swelling Gastrointestinal:  Denies nausea, heartburn or change in bowel habits Skin: Denies abnormal skin rashes Lymphatics: Denies new lymphadenopathy or easy bruising Neurological:Denies numbness, tingling or new weaknesses Behavioral/Psych: Mood is stable, no new changes  All other systems were reviewed with the patient and are negative.  I have reviewed the past medical history, past surgical history, social history and family history with the patient and they are unchanged from previous note.  ALLERGIES:  is allergic to morphine and related; multaq; demerol; oysters; penicillins; and sulfa drugs cross reactors.  MEDICATIONS:  Current Outpatient Prescriptions  Medication Sig Dispense Refill  . acetaminophen (TYLENOL) 500 MG tablet Take 1,000 mg by mouth every 6 (six) hours as needed for moderate pain, fever or headache.    . albuterol (PROAIR HFA) 108 (90 BASE) MCG/ACT inhaler Inhale 2 puffs into the lungs every 6 (six) hours as needed for wheezing or shortness of breath.    Marland Kitchen albuterol (PROVENTIL) (2.5 MG/3ML) 0.083% nebulizer solution Inhale 3 mLs into the lungs every 6 (six) hours as needed for wheezing or shortness of breath.     . cephALEXin (KEFLEX) 250 MG capsule Take by mouth daily.    . Cholecalciferol (VITAMIN D) 2000 UNITS tablet Take 2,000 Units by mouth daily with lunch.    . diltiazem (CARDIZEM CD) 120 MG 24 hr capsule Take 120 mg by mouth 2 (two) times daily.     . diphenoxylate-atropine (LOMOTIL) 2.5-0.025 MG per tablet Take 1 tablet by mouth 4 (four) times daily as needed for diarrhea or loose stools. 90 tablet 0  . flecainide (TAMBOCOR) 100 MG tablet Take 1  tablet (100 mg total) by mouth every morning. And 1/2 (half) tablet in the evening.    . Fluticasone-Salmeterol (ADVAIR) 250-50 MCG/DOSE AEPB Inhale 2 puffs into the lungs every 12 (twelve) hours.     Marland Kitchen HYDROcodone-acetaminophen (NORCO) 5-325 MG tablet Take 1-2 tablets by mouth every 6 (six) hours as needed. 30 tablet 0  . levothyroxine (SYNTHROID, LEVOTHROID) 200 MCG tablet Take 200 mcg by mouth daily before breakfast.    . lidocaine-prilocaine (EMLA) cream Apply 1 application topically as needed. Apply to [facility] a Cath site at least one hour prior to needle stick as needed. 30 g 10  . Magnesium Oxide 500 MG TABS Take 500 mg by mouth daily.     . metFORMIN (GLUCOPHAGE-XR) 500 MG 24 hr tablet Take 500 mg by mouth 2 (two) times daily with a meal.    . montelukast (SINGULAIR) 10 MG tablet Take 10 mg by mouth daily.    . ondansetron (ZOFRAN) 8 MG tablet Take 8 mg by mouth every 8 (eight) hours as needed for nausea or vomiting.    . pravastatin (PRAVACHOL) 40 MG tablet Take 20 mg by mouth See admin instructions. Take 1/2 tablet (20 mg) ONLY on Monday, Tuesday, Wednesday, and Thursday Nights.    Marland Kitchen PRESCRIPTION MEDICATION Antibody Plan [facility]    . prochlorperazine (COMPAZINE) 10 MG tablet Take 1 tablet (10 mg total) by mouth every 6 (six) hours as needed for nausea (nausea). 30 tablet 6  . rivaroxaban (XARELTO)  20 MG TABS tablet Take 20 mg by mouth daily with supper.    . traMADol-acetaminophen (ULTRACET) 37.5-325 MG per tablet Take 1 tablet by mouth every 6 (six) hours as needed. (Patient taking differently: Take 1 tablet by mouth every 6 (six) hours as needed for moderate pain. ) 30 tablet 0  . vitamin B-12 (CYANOCOBALAMIN) 500 MCG tablet Take 500 mcg by mouth daily.    Marland Kitchen anastrozole (ARIMIDEX) 1 MG tablet Take 1 tablet (1 mg total) by mouth daily. 90 tablet 3   No current facility-administered medications for this visit.    PHYSICAL EXAMINATION: ECOG PERFORMANCE STATUS: 0 - Asymptomatic  Filed  Vitals:   07/11/15 0918  BP: 116/62  Pulse: 80  Temp: 98.2 F (36.8 C)  Resp: 17   Filed Weights   07/11/15 0918  Weight: 145 lb 6.4 oz (65.953 kg)    GENERAL:alert, no distress and comfortable SKIN: skin color, texture, turgor are normal, no rashes or significant lesions EYES: normal, Conjunctiva are pink and non-injected, sclera clear OROPHARYNX:no exudate, no erythema and lips, buccal mucosa, and tongue normal  NECK: supple, thyroid normal size, non-tender, without nodularity LYMPH:  no palpable lymphadenopathy in the cervical, axillary or inguinal LUNGS: clear to auscultation and percussion with normal breathing effort HEART: regular rate & rhythm With soft systolic murmurs and no lower extremity edema ABDOMEN:abdomen soft, non-tender and normal bowel sounds Musculoskeletal:no cyanosis of digits and no clubbing  NEURO: alert & oriented x 3 with fluent speech, no focal motor/sensory deficits  LABORATORY DATA:  I have reviewed the data as listed    Component Value Date/Time   NA 137 06/12/2015 0850   NA 137 05/28/2015 0422   K 4.2 06/12/2015 0850   K 4.7 05/28/2015 0422   CL 106 05/28/2015 0422   CO2 24 06/12/2015 0850   CO2 23 05/28/2015 0422   GLUCOSE 123 06/12/2015 0850   GLUCOSE 145* 05/28/2015 0422   BUN 19.9 06/12/2015 0850   BUN 10 05/28/2015 0422   CREATININE 0.9 06/12/2015 0850   CREATININE 0.57 05/28/2015 0422   CALCIUM 10.0 06/12/2015 0850   CALCIUM 9.1 05/28/2015 0422   PROT 6.9 06/12/2015 0850   PROT 7.3 04/26/2015 2141   ALBUMIN 3.8 06/12/2015 0850   ALBUMIN 3.5 04/26/2015 2141   AST 21 06/12/2015 0850   AST 44* 04/26/2015 2141   ALT 20 06/12/2015 0850   ALT 23 04/26/2015 2141   ALKPHOS 85 06/12/2015 0850   ALKPHOS 60 04/26/2015 2141   BILITOT 0.43 06/12/2015 0850   BILITOT 0.5 04/26/2015 2141   GFRNONAA >60 05/28/2015 0422   GFRAA >60 05/28/2015 0422    No results found for: SPEP, UPEP  Lab Results  Component Value Date   WBC 5.9  07/11/2015   NEUTROABS 3.8 07/11/2015   HGB 10.0* 07/11/2015   HCT 30.5* 07/11/2015   MCV 85.4 07/11/2015   PLT 194 07/11/2015      Chemistry      Component Value Date/Time   NA 137 06/12/2015 0850   NA 137 05/28/2015 0422   K 4.2 06/12/2015 0850   K 4.7 05/28/2015 0422   CL 106 05/28/2015 0422   CO2 24 06/12/2015 0850   CO2 23 05/28/2015 0422   BUN 19.9 06/12/2015 0850   BUN 10 05/28/2015 0422   CREATININE 0.9 06/12/2015 0850   CREATININE 0.57 05/28/2015 0422      Component Value Date/Time   CALCIUM 10.0 06/12/2015 0850   CALCIUM 9.1 05/28/2015 0422  ALKPHOS 85 06/12/2015 0850   ALKPHOS 60 04/26/2015 2141   AST 21 06/12/2015 0850   AST 44* 04/26/2015 2141   ALT 20 06/12/2015 0850   ALT 23 04/26/2015 2141   BILITOT 0.43 06/12/2015 0850   BILITOT 0.5 04/26/2015 2141      ASSESSMENT & PLAN:  Breast cancer of upper-inner quadrant of right female breast ([place]) I reviewed the most recent bone density scan. She has excellent bone density in her spine but mild osteopenia in her femur. She is taking high-dose vitamin D and calcium. I'm comfortable recommending her to start Arimidex on 07/14/2015 as part of the adjuvant treatment for breast cancer. I will see her back within 6 weeks to assess side effects. Some of the expected side effects including risk of mild arthralgias, osteopenia, hot flashes, and mood swings and she agreed to proceed.   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  Chronic atrial fibrillation ([facility]) She has chronic atrial fibrillation. 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 She also had mild infusion reaction with prior IVIG. I plan to reduce the dose to 400 mg/kg 1 dose monthly along with  premedication. She will get it today. She is currently on prophylactic therapy with Keflex to reduce recurrent sepsis Her last IgG level was adequate after replacement therapy. I will give her 1 more dose today and then stop and observe.  Anemia in chronic illness This is likely due to recent treatment and anemia of chronic disease. The patient denies recent history of bleeding such as epistaxis, hematuria or hematochezia. She is asymptomatic from the anemia. I will observe for now.     Osteopenia She will remain on high-dose vitamin D and calcium replacement therapy. She will be due for repeat bone density scan in September 2017.   No orders of the defined types were placed in this encounter.   All questions were answered. The patient knows to call the clinic with any problems, questions or concerns. No barriers to learning was detected. I spent 20 minutes counseling the patient face to face. The total time spent in the appointment was 30 minutes and more than 50% was on counseling and review of test results     [facility], [place], MD 07/11/2015 9:37 AM

## 2015-07-11 NOTE — Patient Instructions (Signed)

## 2015-07-11 NOTE — Assessment & Plan Note (Signed)
This is due to rituximab. She also had mild infusion reaction with prior IVIG. I plan to reduce the dose to 400 mg/kg 1 dose monthly along with premedication. She will get it today. She is currently on prophylactic therapy with Keflex to reduce recurrent sepsis Her last IgG level was adequate after replacement therapy. I will give her 1 more dose today and then stop and observe.

## 2015-07-11 NOTE — Assessment & Plan Note (Signed)
She will remain on high-dose vitamin D and calcium replacement therapy. She will be due for repeat bone density scan in September 2017.

## 2015-07-11 NOTE — Assessment & Plan Note (Signed)
Her recent CT scan from December 2016 show no evidence of recurrence of lymphoma I plan to observe only.

## 2015-07-11 NOTE — Patient Instructions (Signed)

## 2015-07-11 NOTE — Assessment & Plan Note (Signed)
This is likely due to recent treatment and anemia of chronic disease. The patient denies recent history of bleeding such as epistaxis, hematuria or hematochezia. She is asymptomatic from the anemia. I will observe for now.  

## 2015-07-11 NOTE — Assessment & Plan Note (Signed)
I reviewed the most recent bone density scan. She has excellent bone density in her spine but mild osteopenia in her femur. She is taking high-dose vitamin D and calcium. I'm comfortable recommending her to start Arimidex on 07/14/2015 as part of the adjuvant treatment for breast cancer. I will see her back within 6 weeks to assess side effects. Some of the expected side effects including risk of mild arthralgias, osteopenia, hot flashes, and mood swings and she agreed to proceed.

## 2015-07-11 NOTE — Assessment & Plan Note (Signed)
She has chronic atrial fibrillation. Currently, she is on medical management & chronic anticoagulation therapy as directed by cardiologist

## 2015-07-30 ENCOUNTER — Encounter: Payer: Self-pay | Admitting: Internal Medicine

## 2015-07-30 ENCOUNTER — Ambulatory Visit (INDEPENDENT_AMBULATORY_CARE_PROVIDER_SITE_OTHER): Payer: Medicare Other | Admitting: Internal Medicine

## 2015-07-30 ENCOUNTER — Encounter: Payer: Self-pay | Admitting: *Deleted

## 2015-07-30 VITALS — BP 122/66 | HR 88 | Ht 66.5 in | Wt 149.4 lb

## 2015-07-30 DIAGNOSIS — I10 Essential (primary) hypertension: Secondary | ICD-10-CM | POA: Insufficient documentation

## 2015-07-30 DIAGNOSIS — I4510 Unspecified right bundle-branch block: Secondary | ICD-10-CM

## 2015-07-30 DIAGNOSIS — I481 Persistent atrial fibrillation: Secondary | ICD-10-CM

## 2015-07-30 DIAGNOSIS — Z01812 Encounter for preprocedural laboratory examination: Secondary | ICD-10-CM | POA: Diagnosis not present

## 2015-07-30 DIAGNOSIS — I4819 Other persistent atrial fibrillation: Secondary | ICD-10-CM

## 2015-07-30 DIAGNOSIS — Z79899 Other long term (current) drug therapy: Secondary | ICD-10-CM

## 2015-07-30 NOTE — Progress Notes (Signed)
Electrophysiology Office Note   Date:  07/30/2015   ID:  Patty [name], DOB 04-18-1940, MRN [id number]  PCP:  Horatio Pel, MD  Cardiologist:  Dr Einar Gip Primary Electrophysiologist: Thompson Grayer, MD    Chief Complaint  Patient presents with  . Atrial Fibrillation     History of Present Illness: Patty [name] is a 75 y.o. female who presents today for electrophysiology evaluation.   The patient reports initially being diagnosed with atrial fibrillation in 2013.  She was cardioverted at that time.  She did well with cardizem.  She tried multaq but did not tolerate this due to elevated LFTs.  She did well for several years without afib.  Over the past 2 years, she has had increased afib.  She was started on flecainide several months ago.  She did have some reduction in AF burden with flecainide.  She required cardioversion 06/18/15.  She felt improved energy and reduce SOB the next day but quickly returned to her afib.  She has returned to afib.  She notices reduce exercise tolerance in afib.   Today, she denies symptoms of palpitations, chest pain,  orthopnea, PND, lower extremity edema, claudication, dizziness, presyncope, syncope, bleeding, or neurologic sequela. The patient is tolerating medications without difficulties and is otherwise without complaint today.    Past Medical History  Diagnosis Date  . Persistent atrial fibrillation ([place])   . Asthma   . Pneumonia   . Hypothyroidism   . Diabetes mellitus without complication ([place])   . Chronic kidney disease     recurring UTI  . Arthritis   . Cancer ([place])     lymphoma and breast  . Complication of anesthesia     "morphine made me stop breathing"  . Hypertension   . Shortness of breath dyspnea     "at times"  . History of skin cancer   . Nocturia   . History of transfusion   . Sleep apnea     uses c-pap  . 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 Breast cancer ([place]) 2/17    R sided, s/p total  mastectomy  . Left atrial enlargement    Past Surgical History  Procedure Laterality Date  . Appendectomy  1953  . Partial hysterectomy  1972  . Cholecystectomy  1993  . Back surgery      x2  . Cataract extraction    . Cardiac catheterization  10/10/2008    Nonobstructive CAD  . Abdominal hysterectomy  1972    TAH  . Foot surgery    . Tonsillectomy    . Pubovag repair with sling    . Skin tag removal  2012    leg  . Cardioversion  04/13/2011    Procedure: CARDIOVERSION;  Surgeon: Laverda Page, MD;  Location: [facility];  Service: Cardiovascular;  Laterality: N/A;  . Eye surgery      /w IOL, post cataracts removed   . Mass biopsy Left 12/07/2013    Procedure: EXCISIONAL BIOPSY LEFT NECK MASS ;  Surgeon: Jerrell Belfast, MD;  Location: [facility];  Service: ENT;  Laterality: Left;  . Lumbar laminectomy    . Total mastectomy Right 05/27/2015    Procedure: RIGHT TOTAL MASTECTOMY;  Surgeon: Fanny Skates, MD;  Location: [facility];  Service: General;  Laterality: Right;  . Cardioversion N/A 06/18/2015    Procedure: CARDIOVERSION;  Surgeon: Adrian Prows, MD;  Location: [facility];  Service: Cardiovascular;  Laterality: N/A;  Current Outpatient Prescriptions  Medication Sig Dispense Refill  . acetaminophen (TYLENOL) 500 MG tablet Take 1,000 mg by mouth every 6 (six) hours as needed for moderate pain, fever or headache.    . albuterol (PROAIR HFA) 108 (90 BASE) MCG/ACT inhaler Inhale 2 puffs into the lungs every 6 (six) hours as needed for wheezing or shortness of breath.    Marland Kitchen albuterol (PROVENTIL) (2.5 MG/3ML) 0.083% nebulizer solution Inhale 3 mLs into the lungs every 6 (six) hours as needed for wheezing or shortness of breath.     . anastrozole (ARIMIDEX) 1 MG tablet Take 1 tablet (1 mg total) by mouth daily. 90 tablet 3  . cephALEXin (KEFLEX) 250 MG capsule Take 250 mg by mouth at bedtime.     . Cholecalciferol (VITAMIN D) 2000 UNITS tablet Take 2,000 Units by mouth daily with lunch.    .  diltiazem (CARDIZEM CD) 120 MG 24 hr capsule Take 120 mg by mouth 2 (two) times daily.     . diphenoxylate-atropine (LOMOTIL) 2.5-0.025 MG per tablet Take 1 tablet by mouth 4 (four) times daily as needed for diarrhea or loose stools. 90 tablet 0  . flecainide (TAMBOCOR) 100 MG tablet Take 1 tablet (100 mg total) by mouth every morning. And 1/2 (half) tablet in the evening.    . Fluticasone-Salmeterol (ADVAIR) 250-50 MCG/DOSE AEPB Inhale 2 puffs into the lungs every 12 (twelve) hours.     Marland Kitchen HYDROcodone-acetaminophen (NORCO) 5-325 MG tablet Take 1-2 tablets by mouth every 6 (six) hours as needed. 30 tablet 0  . levothyroxine (SYNTHROID, LEVOTHROID) 200 MCG tablet Take 200 mcg by mouth daily before breakfast.    . lidocaine-prilocaine (EMLA) cream Apply 1 application topically as needed. Apply to [facility] a Cath site at least one hour prior to needle stick as needed. 30 g 10  . Magnesium Oxide 500 MG TABS Take 500 mg by mouth daily.     . metFORMIN (GLUCOPHAGE-XR) 500 MG 24 hr tablet Take 500 mg by mouth 2 (two) times daily with a meal.    . montelukast (SINGULAIR) 10 MG tablet Take 10 mg by mouth daily.    . ondansetron (ZOFRAN) 8 MG tablet Take 8 mg by mouth every 8 (eight) hours as needed for nausea or vomiting.    . pravastatin (PRAVACHOL) 40 MG tablet Take 20 mg by mouth See admin instructions. Take 1/2 tablet (20 mg) ONLY on Monday, Tuesday, Wednesday, and Thursday Nights.    Marland Kitchen PRESCRIPTION MEDICATION Antibody Plan [facility]    . prochlorperazine (COMPAZINE) 10 MG tablet Take 1 tablet (10 mg total) by mouth every 6 (six) hours as needed for nausea (nausea). 30 tablet 6  . rivaroxaban (XARELTO) 20 MG TABS tablet Take 20 mg by mouth daily with supper.    . traMADol-acetaminophen (ULTRACET) 37.5-325 MG per tablet Take 1 tablet by mouth every 6 (six) hours as needed. (Patient taking differently: Take 1 tablet by mouth every 6 (six) hours as needed for moderate pain. ) 30 tablet 0  . vitamin B-12  (CYANOCOBALAMIN) 500 MCG tablet Take 500 mcg by mouth daily.     No current facility-administered medications for this visit.    Allergies:   Morphine and related; Multaq; Demerol; Oysters; Penicillins; and Sulfa drugs cross reactors   Social History:  The patient  reports that she has never smoked. She has never used smokeless tobacco. She reports that she does not drink alcohol or use illicit drugs.   Family History:  The patient's  family  history includes Arthritis in her mother; Breast cancer in her mother; Diabetes in her sister; Heart attack in her brother and father; Heart disease in her mother; Heart failure in her mother; Hypertension in her brother and father; Stroke in her father.    ROS:  Please see the history of present illness.   All other systems are reviewed and negative.    PHYSICAL EXAM: VS:  BP 122/66 mmHg  Pulse 88  Ht 5' 6.5" (1.689 m)  Wt 149 lb 6.4 oz (67.767 kg)  BMI 23.76 kg/m2  SpO2 98% , BMI Body mass index is 23.76 kg/(m^2). GEN: Well nourished, well developed, in no acute distress HEENT: normal Neck: no JVD, carotid bruits, or masses Cardiac: iRRR; no murmurs, rubs, or gallops,no edema  Respiratory:  clear to auscultation bilaterally, normal work of breathing GI: soft, nontender, nondistended, + BS MS: no deformity or atrophy Skin: warm and dry  Neuro:  Strength and sensation are intact Psych: euthymic mood, full affect  EKG:  EKG is ordered today. The ekg ordered today shows afib, RBBB (QRS 184 msec), Qtc 554 msec   Recent Labs: 04/28/2015: Magnesium 1.5* 04/30/2015: TSH 4.198 07/11/2015: ALT 21; BUN 18.9; Creatinine 0.9; HGB 10.0*; Platelets 194; Potassium 3.7; Sodium 140    Lipid Panel     Component Value Date/Time   CHOL  05/05/2010 0200    128        ATP III CLASSIFICATION:  <200     mg/dL   Desirable  200-239  mg/dL   Borderline High  >=240    mg/dL   High          TRIG 163* 05/05/2010 0200   HDL 44 05/05/2010 0200   CHOLHDL 2.9  05/05/2010 0200   VLDL 33 05/05/2010 0200   LDLCALC  05/05/2010 0200    51        Total Cholesterol/HDL:CHD Risk Coronary Heart Disease Risk Table                     Men   Women  1/2 Average Risk   3.4   3.3  Average Risk       5.0   4.4  2 X Average Risk   9.6   7.1  3 X Average Risk  23.4   11.0        Use the calculated Patient Ratio above and the CHD Risk Table to determine the patient's CHD Risk.        ATP III CLASSIFICATION (LDL):  <100     mg/dL   Optimal  100-129  mg/dL   Near or Above                    Optimal  130-159  mg/dL   Borderline  160-189  mg/dL   High  >190     mg/dL   Very High     Wt Readings from Last 3 Encounters:  07/30/15 149 lb 6.4 oz (67.767 kg)  07/11/15 145 lb 6.4 oz (65.953 kg)  06/18/15 141 lb (63.957 kg)      Other studies Reviewed: Additional studies/ records that were reviewed today include: Dr Milderd Meager notes, echo 2013, 2015   ASSESSMENT AND PLAN:  1.  Paroxysmal atrial fibrillation, possibly atrial flutter also The patient has symptomatic atrial arrhythmias.  She has failed medical therapy with multaq and flecainide. EKG today reveals RBBB and concerns for flecainide toxicity.  Will check flecainide level and stop this  medicine Repeat ecg in 1 week to see if QRS narrows.  Unless there is significant improvement in ekg, very few AAD options due to conduction system disease. Therapeutic strategies for afib including tikosyn (IF QT improves), amiodarone, and ablation were discussed in detail with the patient today. Risk, benefits, and alternatives to EP study and radiofrequency ablation for afib were also discussed in detail today. These risks include but are not limited to stroke, bleeding, vascular damage, tamponade, perforation, damage to the esophagus, lungs, and other structures, pulmonary vein stenosis, worsening renal function, and death. The patient understands these risk and wishes to proceed with ablation at the next available  time. Repeat echo to assess LA size and structure Will need TEE prior to ablation Continue xarelto (Chads2vasc score is at least 4)  2. RBBB Stop flecainide As above  3. HTN Stable No change required today   Current medicines are reviewed at length with the patient today.   The patient does not have concerns regarding her medicines.  The following changes were made today:  none  Signed, [place] Grayer, MD  07/30/2015 9:30 AM     [facility] [address] [place] [address] [phone] (office) [phone] (fax)

## 2015-07-30 NOTE — Patient Instructions (Signed)
Medication Instructions:  Your physician has recommended you make the following change in your medication:  1) STOP Flecainide  Labwork: Your physician recommends that you return for lab work in: 1 week for a Flecainide level  Testing/Procedures: Your physician has requested that you have an echocardiogram in 1-2 week. Echocardiography is a painless test that uses sound waves to create images of your heart. It provides your doctor with information about the size and shape of your heart and how well your heart's chambers and valves are working. This procedure takes approximately one hour. There are no restrictions for this procedure.  Your physician has requested that you have a TEE. During a TEE, sound waves are used to create images of your heart. It provides your doctor with information about the size and shape of your heart and how well your heart's chambers and valves are working. In this test, a transducer is attached to the end of a flexible tube that's guided down your throat and into your esophagus (the tube leading from you mouth to your stomach) to get a more detailed image of your heart. You are not awake for the procedure. Please see the instruction sheet given to you today. For further information please visit [email or web address].  Your physician has recommended that you have an ablation. Catheter ablation is a medical procedure used to treat some cardiac arrhythmias (irregular heartbeats). During catheter ablation, a long, thin, flexible tube is put into a blood vessel in your groin (upper thigh), or neck. This tube is called an ablation catheter. It is then guided to your heart through the blood vessel. Radio frequency waves destroy small areas of heart tissue where abnormal heartbeats may cause an arrhythmia to start. Please see the instruction sheet given to you today.  Follow-Up: Your physician recommends that you schedule a follow-up appointment in: 4 weeks, after your procedure on  08/18/2045, with Roderic Palau, NP in the AFib [facility].  Your physician recommends that you schedule a follow-up appointment in: 3 months, after your procedure on 08/19/2015, with Dr. Rayann Heman.  If you need a refill on your cardiac medications before your next appointment, please call your pharmacy.   Any Other Special Instructions Will Be Listed Below (If Applicable). Cardiac Ablation Cardiac ablation is a procedure to disable a small amount of heart tissue in very specific places. The heart has many electrical connections. Sometimes these connections are abnormal and can cause the heart to beat very fast or irregularly. By disabling some of the problem areas, heart rhythm can be improved or made normal. Ablation is done for people who:   Have Wolff-Parkinson-White syndrome.   Have other fast heart rhythms (tachycardia).   Have taken medicines for an abnormal heart rhythm (arrhythmia) that resulted in:   No success.   Side effects.   May have a high-risk heartbeat that could result in death.  LET [facility] CARE PROVIDER KNOW ABOUT:   Any allergies you have or any previous reactions you have had to X-ray dye, food (such as seafood), medicine, or tape.   All medicines you are taking, including vitamins, herbs, eye drops, creams, and over-the-counter medicines.   Previous problems you or members of your family have had with the use of anesthetics.   Any blood disorders you have.   Previous surgeries or procedures (such as a kidney transplant) you have had.   Medical conditions you have (such as kidney failure).  RISKS AND COMPLICATIONS Generally, cardiac ablation is a safe procedure. However, problems can  occur and include:   Increased risk of cancer. Depending on how long it takes to do the ablation, the dose of radiation can be high.  Bruising and bleeding where a thin, flexible tube (catheter) was inserted during the procedure.   Bleeding into the chest,  especially into the sac that surrounds the heart (serious).  Need for a permanent pacemaker if the normal electrical system is damaged.   The procedure may not be fully effective, and this may not be recognized for months. Repeat ablation procedures are sometimes required. BEFORE THE PROCEDURE   Follow any instructions from your health care provider regarding eating and drinking before the procedure.   Take your medicines as directed at regular times with water, unless instructed otherwise by your health care provider. If you are taking diabetes medicine, including insulin, ask how you are to take it and if there are any special instructions you should follow. It is common to adjust insulin dosing the day of the ablation.  PROCEDURE  An ablation is usually performed in a catheterization laboratory with the guidance of fluoroscopy. Fluoroscopy is a type of X-ray that helps your health care provider see images of your heart during the procedure.   An ablation is a minimally invasive procedure. This means a small cut (incision) is made in either your neck or groin. Your health care provider will decide where to make the incision based on your medical history and physical exam.  An IV tube will be started before the procedure begins. You will be given an anesthetic or medicine to help you relax (sedative).  The skin on your neck or groin will be numbed. A needle will be inserted into a large vein in your neck or groin and catheters will be threaded to your heart.  A special dye that shows up on fluoroscopy pictures may be injected through the catheter. The dye helps your health care provider see the area of the heart that needs treatment.  The catheter has electrodes on the tip. When the area of heart tissue that is causing the arrhythmia is found, the catheter tip will send an electrical current to the area and "scar" the tissue. Three types of energy can be used to ablate the heart tissue:    Heat (radiofrequency energy).   Laser energy.   Extreme cold (cryoablation).   When the area of the heart has been ablated, the catheter will be taken out. Pressure will be held on the insertion site. This will help the insertion site clot and keep it from bleeding. A bandage will be placed on the insertion site.  AFTER THE PROCEDURE   After the procedure, you will be taken to a recovery area where your vital signs (blood pressure, heart rate, and breathing) will be monitored. The insertion site will also be monitored for bleeding.   You will need to lie still for 4-6 hours. This is to ensure you do not bleed from the catheter insertion site.    This information is not intended to replace advice given to you by your health care provider. Make sure you discuss any questions you have with your health care provider.   Document Released: 07/04/2008 Document Revised: 03/08/2014 Document Reviewed: 07/10/2012 Elsevier Interactive Patient Education [employer].

## 2015-08-04 DIAGNOSIS — M545 Low back pain: Secondary | ICD-10-CM | POA: Diagnosis not present

## 2015-08-04 DIAGNOSIS — M1288 Other specific arthropathies, not elsewhere classified, other specified site: Secondary | ICD-10-CM | POA: Diagnosis not present

## 2015-08-04 DIAGNOSIS — R29898 Other symptoms and signs involving the musculoskeletal system: Secondary | ICD-10-CM | POA: Diagnosis not present

## 2015-08-04 DIAGNOSIS — R258 Other abnormal involuntary movements: Secondary | ICD-10-CM | POA: Diagnosis not present

## 2015-08-04 DIAGNOSIS — E1142 Type 2 diabetes mellitus with diabetic polyneuropathy: Secondary | ICD-10-CM | POA: Diagnosis not present

## 2015-08-05 DIAGNOSIS — M545 Low back pain, unspecified: Secondary | ICD-10-CM | POA: Insufficient documentation

## 2015-08-05 DIAGNOSIS — R29898 Other symptoms and signs involving the musculoskeletal system: Secondary | ICD-10-CM | POA: Insufficient documentation

## 2015-08-05 DIAGNOSIS — M47816 Spondylosis without myelopathy or radiculopathy, lumbar region: Secondary | ICD-10-CM | POA: Insufficient documentation

## 2015-08-05 DIAGNOSIS — E1142 Type 2 diabetes mellitus with diabetic polyneuropathy: Secondary | ICD-10-CM | POA: Insufficient documentation

## 2015-08-05 DIAGNOSIS — R258 Other abnormal involuntary movements: Secondary | ICD-10-CM | POA: Insufficient documentation

## 2015-08-08 ENCOUNTER — Other Ambulatory Visit: Payer: Self-pay

## 2015-08-08 ENCOUNTER — Ambulatory Visit (INDEPENDENT_AMBULATORY_CARE_PROVIDER_SITE_OTHER): Payer: Medicare Other

## 2015-08-08 ENCOUNTER — Ambulatory Visit (HOSPITAL_COMMUNITY): Payer: Medicare Other | Attending: Cardiology

## 2015-08-08 ENCOUNTER — Other Ambulatory Visit (INDEPENDENT_AMBULATORY_CARE_PROVIDER_SITE_OTHER): Payer: Medicare Other

## 2015-08-08 DIAGNOSIS — I313 Pericardial effusion (noninflammatory): Secondary | ICD-10-CM | POA: Diagnosis not present

## 2015-08-08 DIAGNOSIS — I517 Cardiomegaly: Secondary | ICD-10-CM | POA: Diagnosis not present

## 2015-08-08 DIAGNOSIS — I481 Persistent atrial fibrillation: Secondary | ICD-10-CM

## 2015-08-08 DIAGNOSIS — I351 Nonrheumatic aortic (valve) insufficiency: Secondary | ICD-10-CM | POA: Insufficient documentation

## 2015-08-08 DIAGNOSIS — E785 Hyperlipidemia, unspecified: Secondary | ICD-10-CM | POA: Diagnosis not present

## 2015-08-08 DIAGNOSIS — I4819 Other persistent atrial fibrillation: Secondary | ICD-10-CM

## 2015-08-08 DIAGNOSIS — I071 Rheumatic tricuspid insufficiency: Secondary | ICD-10-CM | POA: Insufficient documentation

## 2015-08-08 DIAGNOSIS — Z01812 Encounter for preprocedural laboratory examination: Secondary | ICD-10-CM | POA: Diagnosis not present

## 2015-08-08 DIAGNOSIS — E119 Type 2 diabetes mellitus without complications: Secondary | ICD-10-CM | POA: Insufficient documentation

## 2015-08-08 DIAGNOSIS — I340 Nonrheumatic mitral (valve) insufficiency: Secondary | ICD-10-CM | POA: Insufficient documentation

## 2015-08-08 DIAGNOSIS — Z79899 Other long term (current) drug therapy: Secondary | ICD-10-CM

## 2015-08-08 DIAGNOSIS — I4891 Unspecified atrial fibrillation: Secondary | ICD-10-CM | POA: Diagnosis not present

## 2015-08-08 DIAGNOSIS — I2510 Atherosclerotic heart disease of native coronary artery without angina pectoris: Secondary | ICD-10-CM | POA: Insufficient documentation

## 2015-08-08 LAB — CBC WITH DIFFERENTIAL/PLATELET
BASOS ABS: 63 {cells}/uL (ref 0–200)
BASOS PCT: 1 %
EOS ABS: 189 {cells}/uL (ref 15–500)
Eosinophils Relative: 3 %
HEMATOCRIT: 29.5 % — AB (ref 35.0–45.0)
Hemoglobin: 10 g/dL — ABNORMAL LOW (ref 11.7–15.5)
LYMPHS PCT: 19 %
Lymphs Abs: 1197 cells/uL (ref 850–3900)
MCH: 27.9 pg (ref 27.0–33.0)
MCHC: 33.9 g/dL (ref 32.0–36.0)
MCV: 82.4 fL (ref 80.0–100.0)
MONO ABS: 819 {cells}/uL (ref 200–950)
MONOS PCT: 13 %
MPV: 10 fL (ref 7.5–12.5)
Neutro Abs: 4032 cells/uL (ref 1500–7800)
Neutrophils Relative %: 64 %
PLATELETS: 216 10*3/uL (ref 140–400)
RBC: 3.58 MIL/uL — ABNORMAL LOW (ref 3.80–5.10)
RDW: 17.2 % — AB (ref 11.0–15.0)
WBC: 6.3 10*3/uL (ref 3.8–10.8)

## 2015-08-08 LAB — ECHOCARDIOGRAM COMPLETE
Ao-asc: 35 cm
E decel time: 180 msec
FS: 44 % (ref 28–44)
IVS/LV PW RATIO, ED: 1.05
LA ID, A-P, ES: 47 mm
LA diam end sys: 47 mm
LA diam index: 2.63 cm/m2
LA vol A4C: 96 ml
LA vol index: 50.9 mL/m2
LA vol: 91 mL
LV PW d: 12 mm — AB (ref 0.6–1.1)
LVOT SV: 48 mL
LVOT VTI: 15.4 cm
LVOT area: 3.14 cm2
LVOT diameter: 20 mm
LVOT peak grad rest: 3 mmHg
LVOT peak vel: 84.2 cm/s
MV Dec: 180
MV Peak grad: 7 mmHg
MV pk E vel: 134 m/s
RV sys press: 41 mmHg
Reg peak vel: 309 cm/s
TR max vel: 309 cm/s

## 2015-08-08 LAB — BASIC METABOLIC PANEL
BUN: 12 mg/dL (ref 7–25)
CHLORIDE: 105 mmol/L (ref 98–110)
CO2: 25 mmol/L (ref 20–31)
CREATININE: 0.78 mg/dL (ref 0.60–0.93)
Calcium: 9 mg/dL (ref 8.6–10.4)
GLUCOSE: 104 mg/dL — AB (ref 65–99)
POTASSIUM: 4 mmol/L (ref 3.5–5.3)
Sodium: 140 mmol/L (ref 135–146)

## 2015-08-08 NOTE — Patient Instructions (Signed)
Medication Instructions:  Your physician recommends that you continue on your current medications as directed. Please refer to the Current Medication list given to you today.   Labwork: none  Testing/Procedures: none  Follow-Up: Reminded to keep with scheduled ablation on 08/19/2015  Any Other Special Instructions Will Be Listed Below (If Applicable).     If you need a refill on your cardiac medications before your next appointment, please call your pharmacy.

## 2015-08-15 DIAGNOSIS — N3000 Acute cystitis without hematuria: Secondary | ICD-10-CM | POA: Diagnosis not present

## 2015-08-19 ENCOUNTER — Ambulatory Visit (HOSPITAL_COMMUNITY): Payer: Medicare Other | Admitting: Anesthesiology

## 2015-08-19 ENCOUNTER — Encounter (HOSPITAL_COMMUNITY)
Admission: RE | Disposition: A | Discharge status: Home / Self Care | Payer: Self-pay | Source: Ambulatory Visit | Attending: Internal Medicine

## 2015-08-19 ENCOUNTER — Ambulatory Visit (HOSPITAL_COMMUNITY)
Admission: RE | Admit: 2015-08-19 | Discharge: 2015-08-20 | Disposition: A | Discharge status: Home / Self Care | Payer: Medicare Other | Source: Ambulatory Visit | Attending: Internal Medicine | Admitting: Internal Medicine

## 2015-08-19 ENCOUNTER — Encounter (HOSPITAL_COMMUNITY): Payer: Self-pay

## 2015-08-19 ENCOUNTER — Ambulatory Visit (HOSPITAL_BASED_OUTPATIENT_CLINIC_OR_DEPARTMENT_OTHER)
Admission: RE | Admit: 2015-08-19 | Discharge: 2015-08-19 | Disposition: A | Discharge status: Home / Self Care | Payer: Medicare Other | Source: Ambulatory Visit | Attending: Cardiology | Admitting: Cardiology

## 2015-08-19 DIAGNOSIS — N189 Chronic kidney disease, unspecified: Secondary | ICD-10-CM | POA: Diagnosis not present

## 2015-08-19 DIAGNOSIS — I4891 Unspecified atrial fibrillation: Secondary | ICD-10-CM | POA: Diagnosis present

## 2015-08-19 DIAGNOSIS — I480 Paroxysmal atrial fibrillation: Secondary | ICD-10-CM | POA: Diagnosis present

## 2015-08-19 DIAGNOSIS — I10 Essential (primary) hypertension: Secondary | ICD-10-CM | POA: Diagnosis not present

## 2015-08-19 DIAGNOSIS — M1990 Unspecified osteoarthritis, unspecified site: Secondary | ICD-10-CM | POA: Diagnosis not present

## 2015-08-19 DIAGNOSIS — E039 Hypothyroidism, unspecified: Secondary | ICD-10-CM | POA: Insufficient documentation

## 2015-08-19 DIAGNOSIS — I4892 Unspecified atrial flutter: Secondary | ICD-10-CM | POA: Insufficient documentation

## 2015-08-19 DIAGNOSIS — Z79811 Long term (current) use of aromatase inhibitors: Secondary | ICD-10-CM | POA: Insufficient documentation

## 2015-08-19 DIAGNOSIS — Z9011 Acquired absence of right breast and nipple: Secondary | ICD-10-CM | POA: Insufficient documentation

## 2015-08-19 DIAGNOSIS — I482 Chronic atrial fibrillation, unspecified: Secondary | ICD-10-CM

## 2015-08-19 DIAGNOSIS — J45909 Unspecified asthma, uncomplicated: Secondary | ICD-10-CM | POA: Insufficient documentation

## 2015-08-19 DIAGNOSIS — G4733 Obstructive sleep apnea (adult) (pediatric): Secondary | ICD-10-CM | POA: Diagnosis not present

## 2015-08-19 DIAGNOSIS — Z7901 Long term (current) use of anticoagulants: Secondary | ICD-10-CM | POA: Insufficient documentation

## 2015-08-19 DIAGNOSIS — Z8572 Personal history of non-Hodgkin lymphomas: Secondary | ICD-10-CM | POA: Insufficient documentation

## 2015-08-19 DIAGNOSIS — I481 Persistent atrial fibrillation: Secondary | ICD-10-CM | POA: Diagnosis not present

## 2015-08-19 DIAGNOSIS — Z7984 Long term (current) use of oral hypoglycemic drugs: Secondary | ICD-10-CM | POA: Insufficient documentation

## 2015-08-19 DIAGNOSIS — Z79899 Other long term (current) drug therapy: Secondary | ICD-10-CM | POA: Insufficient documentation

## 2015-08-19 DIAGNOSIS — Z853 Personal history of malignant neoplasm of breast: Secondary | ICD-10-CM | POA: Insufficient documentation

## 2015-08-19 DIAGNOSIS — E119 Type 2 diabetes mellitus without complications: Secondary | ICD-10-CM | POA: Insufficient documentation

## 2015-08-19 DIAGNOSIS — I483 Typical atrial flutter: Secondary | ICD-10-CM | POA: Diagnosis not present

## 2015-08-19 HISTORY — DX: Unspecified malignant neoplasm of skin, unspecified: C44.90

## 2015-08-19 HISTORY — DX: Type 2 diabetes mellitus without complications: E11.9

## 2015-08-19 HISTORY — PX: ELECTROPHYSIOLOGIC STUDY: SHX172A

## 2015-08-19 HISTORY — PX: ATRIAL FIBRILLATION ABLATION: SHX5732

## 2015-08-19 HISTORY — PX: TEE WITHOUT CARDIOVERSION: SHX5443

## 2015-08-19 HISTORY — DX: Malignant neoplasm of unspecified site of right female breast: C50.911

## 2015-08-19 HISTORY — DX: Pneumonia, unspecified organism: J18.9

## 2015-08-19 LAB — POCT ACTIVATED CLOTTING TIME
ACTIVATED CLOTTING TIME: 164 s
Activated Clotting Time: 290 seconds
Activated Clotting Time: 307 seconds

## 2015-08-19 LAB — GLUCOSE, CAPILLARY
GLUCOSE-CAPILLARY: 116 mg/dL — AB (ref 65–99)
GLUCOSE-CAPILLARY: 158 mg/dL — AB (ref 65–99)
Glucose-Capillary: 85 mg/dL (ref 65–99)
Glucose-Capillary: 93 mg/dL (ref 65–99)

## 2015-08-19 SURGERY — ECHOCARDIOGRAM, TRANSESOPHAGEAL
Anesthesia: Moderate Sedation

## 2015-08-19 SURGERY — ATRIAL FIBRILLATION ABLATION
Anesthesia: General

## 2015-08-19 MED ORDER — CEPHALEXIN 250 MG PO CAPS
250.0000 mg | ORAL_CAPSULE | Freq: Every day | ORAL | Status: DC
  Administered 2015-08-19: 21:00:00 250 mg via ORAL
  Filled 2015-08-19: qty 1

## 2015-08-19 MED ORDER — FENTANYL CITRATE (PF) 100 MCG/2ML IJ SOLN
INTRAMUSCULAR | Status: AC
  Filled 2015-08-19: qty 2

## 2015-08-19 MED ORDER — BUPIVACAINE HCL (PF) 0.25 % IJ SOLN
INTRAMUSCULAR | Status: DC | PRN
  Administered 2015-08-19: 23 mL

## 2015-08-19 MED ORDER — LEVOTHYROXINE SODIUM 100 MCG PO TABS
200.0000 ug | ORAL_TABLET | Freq: Every day | ORAL | Status: DC
  Administered 2015-08-20: 07:00:00 200 ug via ORAL
  Filled 2015-08-19: qty 2

## 2015-08-19 MED ORDER — FENTANYL CITRATE (PF) 100 MCG/2ML IJ SOLN: 25.0000 ug | INTRAMUSCULAR | Status: DC | PRN

## 2015-08-19 MED ORDER — HEPARIN SODIUM (PORCINE) 1000 UNIT/ML IJ SOLN
INTRAMUSCULAR | Status: DC | PRN
  Administered 2015-08-19: 1000 [IU] via INTRAVENOUS
  Administered 2015-08-19: 2000 [IU] via INTRAVENOUS

## 2015-08-19 MED ORDER — LIDOCAINE 2% (20 MG/ML) 5 ML SYRINGE
INTRAMUSCULAR | Status: DC | PRN
  Administered 2015-08-19: 60 mg via INTRAVENOUS

## 2015-08-19 MED ORDER — IOPAMIDOL (ISOVUE-370) INJECTION 76%
INTRAVENOUS | Status: AC
  Filled 2015-08-19: qty 125

## 2015-08-19 MED ORDER — ANASTROZOLE 1 MG PO TABS
1.0000 mg | ORAL_TABLET | Freq: Every day | ORAL | Status: DC
  Administered 2015-08-19: 18:00:00 1 mg via ORAL
  Filled 2015-08-19 (×2): qty 1

## 2015-08-19 MED ORDER — OFF THE BEAT BOOK
Freq: Once | Status: AC
  Administered 2015-08-19: 22:00:00
  Filled 2015-08-19: qty 1

## 2015-08-19 MED ORDER — INSULIN ASPART 100 UNIT/ML ~~LOC~~ SOLN: 0.0000 [IU] | Freq: Three times a day (TID) | SUBCUTANEOUS | Status: DC

## 2015-08-19 MED ORDER — HEPARIN SODIUM (PORCINE) 1000 UNIT/ML IJ SOLN
INTRAMUSCULAR | Status: DC | PRN
  Administered 2015-08-19: 1000 [IU] via INTRAVENOUS
  Administered 2015-08-19: 12000 [IU] via INTRAVENOUS
  Administered 2015-08-19: 1000 [IU] via INTRAVENOUS

## 2015-08-19 MED ORDER — SODIUM CHLORIDE 0.9% FLUSH: 3.0000 mL | INTRAVENOUS | Status: DC | PRN

## 2015-08-19 MED ORDER — BUPIVACAINE HCL (PF) 0.25 % IJ SOLN
INTRAMUSCULAR | Status: AC
  Filled 2015-08-19: qty 30

## 2015-08-19 MED ORDER — HYDROCODONE-ACETAMINOPHEN 5-325 MG PO TABS: 1.0000 | ORAL_TABLET | Freq: Four times a day (QID) | ORAL | Status: DC | PRN

## 2015-08-19 MED ORDER — MIDAZOLAM HCL 10 MG/2ML IJ SOLN
INTRAMUSCULAR | Status: DC | PRN
  Administered 2015-08-19: 2 mg via INTRAVENOUS
  Administered 2015-08-19: 1 mg via INTRAVENOUS

## 2015-08-19 MED ORDER — SODIUM CHLORIDE 0.9 % IV SOLN: INTRAVENOUS | Status: DC

## 2015-08-19 MED ORDER — SODIUM CHLORIDE 0.9 % IV SOLN
2.0000 ug/min | INTRAVENOUS | Status: DC
  Filled 2015-08-19: qty 2

## 2015-08-19 MED ORDER — RIVAROXABAN 20 MG PO TABS
20.0000 mg | ORAL_TABLET | Freq: Every day | ORAL | Status: DC
  Administered 2015-08-19: 20 mg via ORAL
  Filled 2015-08-19: qty 1

## 2015-08-19 MED ORDER — ACETAMINOPHEN 325 MG PO TABS
650.0000 mg | ORAL_TABLET | ORAL | Status: DC | PRN
Start: 2015-08-19 — End: 2015-08-20
  Administered 2015-08-19: 22:00:00 650 mg via ORAL
  Filled 2015-08-19: qty 2

## 2015-08-19 MED ORDER — MONTELUKAST SODIUM 10 MG PO TABS
10.0000 mg | ORAL_TABLET | Freq: Every day | ORAL | Status: DC
  Administered 2015-08-19: 10 mg via ORAL
  Filled 2015-08-19 (×2): qty 1

## 2015-08-19 MED ORDER — HEPARIN SODIUM (PORCINE) 1000 UNIT/ML IJ SOLN
INTRAMUSCULAR | Status: AC
  Filled 2015-08-19: qty 1

## 2015-08-19 MED ORDER — FENTANYL CITRATE (PF) 100 MCG/2ML IJ SOLN
INTRAMUSCULAR | Status: DC | PRN
  Administered 2015-08-19: 25 ug via INTRAVENOUS

## 2015-08-19 MED ORDER — LACTATED RINGERS IV SOLN
INTRAVENOUS | Status: DC | PRN
  Administered 2015-08-19: 13:00:00 via INTRAVENOUS

## 2015-08-19 MED ORDER — FENTANYL CITRATE (PF) 100 MCG/2ML IJ SOLN
INTRAMUSCULAR | Status: DC | PRN
Start: 2015-08-19 — End: 2015-08-19
  Administered 2015-08-19: 25 ug via INTRAVENOUS

## 2015-08-19 MED ORDER — ALBUTEROL SULFATE (2.5 MG/3ML) 0.083% IN NEBU: 3.0000 mL | INHALATION_SOLUTION | Freq: Four times a day (QID) | RESPIRATORY_TRACT | Status: DC | PRN

## 2015-08-19 MED ORDER — BUTAMBEN-TETRACAINE-BENZOCAINE 2-2-14 % EX AERO
INHALATION_SPRAY | CUTANEOUS | Status: DC | PRN
  Administered 2015-08-19: 2 via TOPICAL

## 2015-08-19 MED ORDER — PROPOFOL 10 MG/ML IV BOLUS
INTRAVENOUS | Status: DC | PRN
  Administered 2015-08-19: 170 mg via INTRAVENOUS

## 2015-08-19 MED ORDER — SODIUM CHLORIDE 0.9 % IV SOLN: 250.0000 mL | INTRAVENOUS | Status: DC | PRN

## 2015-08-19 MED ORDER — IOPAMIDOL (ISOVUE-370) INJECTION 76%
INTRAVENOUS | Status: DC | PRN
  Administered 2015-08-19: 100 mL via INTRAVENOUS
  Administered 2015-08-19: 4 mL via INTRAVENOUS

## 2015-08-19 MED ORDER — PHENYLEPHRINE HCL 10 MG/ML IJ SOLN
INTRAMUSCULAR | Status: DC | PRN
  Administered 2015-08-19 (×6): 40 ug via INTRAVENOUS

## 2015-08-19 MED ORDER — MIDAZOLAM HCL 5 MG/ML IJ SOLN
INTRAMUSCULAR | Status: AC
  Filled 2015-08-19: qty 2

## 2015-08-19 MED ORDER — PROMETHAZINE HCL 25 MG/ML IJ SOLN
6.2500 mg | INTRAMUSCULAR | Status: DC | PRN
Start: 2015-08-19 — End: 2015-08-19

## 2015-08-19 MED ORDER — LACTATED RINGERS IV SOLN: INTRAVENOUS | Status: DC

## 2015-08-19 MED ORDER — MOMETASONE FURO-FORMOTEROL FUM 200-5 MCG/ACT IN AERO
2.0000 | INHALATION_SPRAY | Freq: Two times a day (BID) | RESPIRATORY_TRACT | Status: DC
  Filled 2015-08-19: qty 8.8

## 2015-08-19 MED ORDER — ONDANSETRON HCL 4 MG/2ML IJ SOLN: 4.0000 mg | Freq: Four times a day (QID) | INTRAMUSCULAR | Status: DC | PRN

## 2015-08-19 MED ORDER — SODIUM CHLORIDE 0.9 % IV SOLN
INTRAVENOUS | Status: DC
  Administered 2015-08-19: 08:00:00 via INTRAVENOUS

## 2015-08-19 MED ORDER — PROTAMINE SULFATE 10 MG/ML IV SOLN
INTRAVENOUS | Status: DC | PRN
  Administered 2015-08-19: 20 mg via INTRAVENOUS
  Administered 2015-08-19: 10 mg via INTRAVENOUS

## 2015-08-19 MED ORDER — ONDANSETRON HCL 4 MG/2ML IJ SOLN
INTRAMUSCULAR | Status: DC | PRN
  Administered 2015-08-19: 4 mg via INTRAVENOUS

## 2015-08-19 MED ORDER — SODIUM CHLORIDE 0.9% FLUSH
3.0000 mL | Freq: Two times a day (BID) | INTRAVENOUS | Status: DC
  Administered 2015-08-19: 22:00:00 3 mL via INTRAVENOUS

## 2015-08-19 MED ORDER — SODIUM CHLORIDE 0.9 % IV SOLN
1000.0000 ug | INTRAVENOUS | Status: DC | PRN
  Administered 2015-08-19: 10 ug/min via INTRAVENOUS

## 2015-08-19 MED ORDER — MEPERIDINE HCL 25 MG/ML IJ SOLN: 6.2500 mg | INTRAMUSCULAR | Status: DC | PRN

## 2015-08-19 SURGICAL SUPPLY — 22 items
BAG SNAP BAND KOVER 36X36 (MISCELLANEOUS) ×3 IMPLANT
BLANKET WARM UNDERBOD FULL ACC (MISCELLANEOUS) ×3 IMPLANT
CATH DIAG 6FR PIGTAIL (CATHETERS) ×3 IMPLANT
CATH NAVISTAR SMARTTOUCH DF (ABLATOR) ×2 IMPLANT
CATH SOUNDSTAR 3D IMAGING (CATHETERS) ×2 IMPLANT
CATH VARIABLE LASSO NAV 2515 (CATHETERS) ×2 IMPLANT
CATH WEBSTER BI DIR CS D-F CRV (CATHETERS) ×2 IMPLANT
COVER SWIFTLINK CONNECTOR (BAG) ×3 IMPLANT
NDL TRANSEP BRK 71CM 407200 (NEEDLE) IMPLANT
NEEDLE TRANSEP BRK 71CM 407200 (NEEDLE) ×3 IMPLANT
PACK EP LATEX FREE (CUSTOM PROCEDURE TRAY) ×3
PACK EP LF (CUSTOM PROCEDURE TRAY) ×1 IMPLANT
PAD DEFIB LIFELINK (PAD) ×3 IMPLANT
PATCH CARTO3 (PAD) ×3 IMPLANT
SHEATH AVANTI 11F 11CM (SHEATH) ×2 IMPLANT
SHEATH PINNACLE 7F 10CM (SHEATH) ×6 IMPLANT
SHEATH PINNACLE 9F 10CM (SHEATH) ×2 IMPLANT
SHEATH SWARTZ TS SL2 63CM 8.5F (SHEATH) ×2 IMPLANT
SHIELD RADPAD SCOOP 12X17 (MISCELLANEOUS) ×3 IMPLANT
SYR MEDRAD MARK V 150ML (SYRINGE) ×2 IMPLANT
TUBING CONTRAST HIGH PRESS 48 (TUBING) ×3 IMPLANT
TUBING SMART ABLATE COOLFLOW (TUBING) ×3 IMPLANT

## 2015-08-19 NOTE — H&P (View-Only) (Signed)
Electrophysiology Office Note   Date:  07/30/2015   ID:  Patty [name], DOB [birth year], MRN [id number]  PCP:  Horatio Pel, MD  Cardiologist:  Dr Einar Gip Primary Electrophysiologist: Thompson Grayer, MD    Chief Complaint  Patient presents with  . Atrial Fibrillation     History of Present Illness: Patty [name] is a 75 y.o. female who presents today for electrophysiology evaluation.   The patient reports initially being diagnosed with atrial fibrillation in 2013.  She was cardioverted at that time.  She did well with cardizem.  She tried multaq but did not tolerate this due to elevated LFTs.  She did well for several years without afib.  Over the past 2 years, she has had increased afib.  She was started on flecainide several months ago.  She did have some reduction in AF burden with flecainide.  She required cardioversion 06/18/15.  She felt improved energy and reduce SOB the next day but quickly returned to her afib.  She has returned to afib.  She notices reduce exercise tolerance in afib.   Today, she denies symptoms of palpitations, chest pain,  orthopnea, PND, lower extremity edema, claudication, dizziness, presyncope, syncope, bleeding, or neurologic sequela. The patient is tolerating medications without difficulties and is otherwise without complaint today.    Past Medical History  Diagnosis Date  . Persistent atrial fibrillation ([place])   . Asthma   . Pneumonia   . Hypothyroidism   . Diabetes mellitus without complication ([place])   . Chronic kidney disease     recurring UTI  . Arthritis   . Cancer ([place])     lymphoma and breast  . Complication of anesthesia     "morphine made me stop breathing"  . Hypertension   . Shortness of breath dyspnea     "at times"  . History of skin cancer   . Nocturia   . History of transfusion   . Sleep apnea     uses c-pap  . 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 Breast cancer ([place]) 2/17    R sided, s/p total  mastectomy  . Left atrial enlargement    Past Surgical History  Procedure Laterality Date  . Appendectomy  1953  . Partial hysterectomy  1972  . Cholecystectomy  1993  . Back surgery      x2  . Cataract extraction    . Cardiac catheterization  10/10/2008    Nonobstructive CAD  . Abdominal hysterectomy  1972    TAH  . Foot surgery    . Tonsillectomy    . Pubovag repair with sling    . Skin tag removal  2012    leg  . Cardioversion  04/13/2011    Procedure: CARDIOVERSION;  Surgeon: Laverda Page, MD;  Location: [facility];  Service: Cardiovascular;  Laterality: N/A;  . Eye surgery      /w IOL, post cataracts removed   . Mass biopsy Left 12/07/2013    Procedure: EXCISIONAL BIOPSY LEFT NECK MASS ;  Surgeon: Jerrell Belfast, MD;  Location: [facility];  Service: ENT;  Laterality: Left;  . Lumbar laminectomy    . Total mastectomy Right 05/27/2015    Procedure: RIGHT TOTAL MASTECTOMY;  Surgeon: Fanny Skates, MD;  Location: [facility];  Service: General;  Laterality: Right;  . Cardioversion N/A 06/18/2015    Procedure: CARDIOVERSION;  Surgeon: Adrian Prows, MD;  Location: [facility];  Service: Cardiovascular;  Laterality: N/A;  Current Outpatient Prescriptions  Medication Sig Dispense Refill  . acetaminophen (TYLENOL) 500 MG tablet Take 1,000 mg by mouth every 6 (six) hours as needed for moderate pain, fever or headache.    . albuterol (PROAIR HFA) 108 (90 BASE) MCG/ACT inhaler Inhale 2 puffs into the lungs every 6 (six) hours as needed for wheezing or shortness of breath.    Marland Kitchen albuterol (PROVENTIL) (2.5 MG/3ML) 0.083% nebulizer solution Inhale 3 mLs into the lungs every 6 (six) hours as needed for wheezing or shortness of breath.     . anastrozole (ARIMIDEX) 1 MG tablet Take 1 tablet (1 mg total) by mouth daily. 90 tablet 3  . cephALEXin (KEFLEX) 250 MG capsule Take 250 mg by mouth at bedtime.     . Cholecalciferol (VITAMIN D) 2000 UNITS tablet Take 2,000 Units by mouth daily with lunch.    .  diltiazem (CARDIZEM CD) 120 MG 24 hr capsule Take 120 mg by mouth 2 (two) times daily.     . diphenoxylate-atropine (LOMOTIL) 2.5-0.025 MG per tablet Take 1 tablet by mouth 4 (four) times daily as needed for diarrhea or loose stools. 90 tablet 0  . flecainide (TAMBOCOR) 100 MG tablet Take 1 tablet (100 mg total) by mouth every morning. And 1/2 (half) tablet in the evening.    . Fluticasone-Salmeterol (ADVAIR) 250-50 MCG/DOSE AEPB Inhale 2 puffs into the lungs every 12 (twelve) hours.     Marland Kitchen HYDROcodone-acetaminophen (NORCO) 5-325 MG tablet Take 1-2 tablets by mouth every 6 (six) hours as needed. 30 tablet 0  . levothyroxine (SYNTHROID, LEVOTHROID) 200 MCG tablet Take 200 mcg by mouth daily before breakfast.    . lidocaine-prilocaine (EMLA) cream Apply 1 application topically as needed. Apply to [facility] a Cath site at least one hour prior to needle stick as needed. 30 g 10  . Magnesium Oxide 500 MG TABS Take 500 mg by mouth daily.     . metFORMIN (GLUCOPHAGE-XR) 500 MG 24 hr tablet Take 500 mg by mouth 2 (two) times daily with a meal.    . montelukast (SINGULAIR) 10 MG tablet Take 10 mg by mouth daily.    . ondansetron (ZOFRAN) 8 MG tablet Take 8 mg by mouth every 8 (eight) hours as needed for nausea or vomiting.    . pravastatin (PRAVACHOL) 40 MG tablet Take 20 mg by mouth See admin instructions. Take 1/2 tablet (20 mg) ONLY on Monday, Tuesday, Wednesday, and Thursday Nights.    Marland Kitchen PRESCRIPTION MEDICATION Antibody Plan [facility]    . prochlorperazine (COMPAZINE) 10 MG tablet Take 1 tablet (10 mg total) by mouth every 6 (six) hours as needed for nausea (nausea). 30 tablet 6  . rivaroxaban (XARELTO) 20 MG TABS tablet Take 20 mg by mouth daily with supper.    . traMADol-acetaminophen (ULTRACET) 37.5-325 MG per tablet Take 1 tablet by mouth every 6 (six) hours as needed. (Patient taking differently: Take 1 tablet by mouth every 6 (six) hours as needed for moderate pain. ) 30 tablet 0  . vitamin B-12  (CYANOCOBALAMIN) 500 MCG tablet Take 500 mcg by mouth daily.     No current facility-administered medications for this visit.    Allergies:   Morphine and related; Multaq; Demerol; Oysters; Penicillins; and Sulfa drugs cross reactors   Social History:  The patient  reports that she has never smoked. She has never used smokeless tobacco. She reports that she does not drink alcohol or use illicit drugs.   Family History:  The patient's  family  history includes Arthritis in her mother; Breast cancer in her mother; Diabetes in her sister; Heart attack in her brother and father; Heart disease in her mother; Heart failure in her mother; Hypertension in her brother and father; Stroke in her father.    ROS:  Please see the history of present illness.   All other systems are reviewed and negative.    PHYSICAL EXAM: VS:  BP 122/66 mmHg  Pulse 88  Ht 5' 6.5" (1.689 m)  Wt 149 lb 6.4 oz (67.767 kg)  BMI 23.76 kg/m2  SpO2 98% , BMI Body mass index is 23.76 kg/(m^2). GEN: Well nourished, well developed, in no acute distress HEENT: normal Neck: no JVD, carotid bruits, or masses Cardiac: iRRR; no murmurs, rubs, or gallops,no edema  Respiratory:  clear to auscultation bilaterally, normal work of breathing GI: soft, nontender, nondistended, + BS MS: no deformity or atrophy Skin: warm and dry  Neuro:  Strength and sensation are intact Psych: euthymic mood, full affect  EKG:  EKG is ordered today. The ekg ordered today shows afib, RBBB (QRS 184 msec), Qtc 554 msec   Recent Labs: 04/28/2015: Magnesium 1.5* 04/30/2015: TSH 4.198 07/11/2015: ALT 21; BUN 18.9; Creatinine 0.9; HGB 10.0*; Platelets 194; Potassium 3.7; Sodium 140    Lipid Panel     Component Value Date/Time   CHOL  05/05/2010 0200    128        ATP III CLASSIFICATION:  <200     mg/dL   Desirable  200-239  mg/dL   Borderline High  >=240    mg/dL   High          TRIG 163* 05/05/2010 0200   HDL 44 05/05/2010 0200   CHOLHDL 2.9  05/05/2010 0200   VLDL 33 05/05/2010 0200   LDLCALC  05/05/2010 0200    51        Total Cholesterol/HDL:CHD Risk Coronary Heart Disease Risk Table                     Men   Women  1/2 Average Risk   3.4   3.3  Average Risk       5.0   4.4  2 X Average Risk   9.6   7.1  3 X Average Risk  23.4   11.0        Use the calculated Patient Ratio above and the CHD Risk Table to determine the patient's CHD Risk.        ATP III CLASSIFICATION (LDL):  <100     mg/dL   Optimal  100-129  mg/dL   Near or Above                    Optimal  130-159  mg/dL   Borderline  160-189  mg/dL   High  >190     mg/dL   Very High     Wt Readings from Last 3 Encounters:  07/30/15 149 lb 6.4 oz (67.767 kg)  07/11/15 145 lb 6.4 oz (65.953 kg)  06/18/15 141 lb (63.957 kg)      Other studies Reviewed: Additional studies/ records that were reviewed today include: Dr Milderd Meager notes, echo 2013, 2015   ASSESSMENT AND PLAN:  1.  Paroxysmal atrial fibrillation, possibly atrial flutter also The patient has symptomatic atrial arrhythmias.  She has failed medical therapy with multaq and flecainide. EKG today reveals RBBB and concerns for flecainide toxicity.  Will check flecainide level and stop this  medicine Repeat ecg in 1 week to see if QRS narrows.  Unless there is significant improvement in ekg, very few AAD options due to conduction system disease. Therapeutic strategies for afib including tikosyn (IF QT improves), amiodarone, and ablation were discussed in detail with the patient today. Risk, benefits, and alternatives to EP study and radiofrequency ablation for afib were also discussed in detail today. These risks include but are not limited to stroke, bleeding, vascular damage, tamponade, perforation, damage to the esophagus, lungs, and other structures, pulmonary vein stenosis, worsening renal function, and death. The patient understands these risk and wishes to proceed with ablation at the next available  time. Repeat echo to assess LA size and structure Will need TEE prior to ablation Continue xarelto (Chads2vasc score is at least 4)  2. RBBB Stop flecainide As above  3. HTN Stable No change required today   Current medicines are reviewed at length with the patient today.   The patient does not have concerns regarding her medicines.  The following changes were made today:  none  Signed, Thompson Grayer, MD  07/30/2015 9:30 AM     [facility] [address] [place] [address] [phone] (office) [phone] (fax)

## 2015-08-19 NOTE — Plan of Care (Signed)
Problem: Consults Goal: EP Study Patient Education (See Patient Education module for education specifics.)  Outcome: Progressing Off The Beat booklet provided.

## 2015-08-19 NOTE — Transfer of Care (Signed)
Immediate Anesthesia Transfer of Care Note  Patient: Patty [name]  Procedure(s) Performed: Procedure(s): Atrial Fibrillation Ablation (N/A)  Patient Location: Cath Lab  Anesthesia Type:General  Level of Consciousness: awake, alert  and oriented  Airway & Oxygen Therapy: Patient Spontanous Breathing and Patient connected to nasal cannula oxygen  Post-op Assessment: Report given to RN, Post -op Vital signs reviewed and stable and Patient moving all extremities  Post vital signs: Reviewed and stable  Last Vitals:  Filed Vitals:   08/19/15 0910 08/19/15 0915  BP: 128/58   Pulse: 73 78  Temp:    Resp: 19     Last Pain: There were no vitals filed for this visit.       Complications: No apparent anesthesia complications

## 2015-08-19 NOTE — Progress Notes (Signed)
  Echocardiogram Echocardiogram Transesophageal has been performed.  Patty [name] 08/19/2015, 8:54 AM

## 2015-08-19 NOTE — CV Procedure (Signed)
Procedure: TEE  Indication: Pre-atrial fibrillation ablation.   Sedation: Versed 3 mg IV, Fentanyl 25 mcg IV  Findings: Please see echo section for full report.  The patient was in atrial fibrillation.  Normal LV size with EF 55-60%.  Normal wall motion. Normal RV size and systolic function.  Mild left atrial enlargement.  No LA appendage thrombus.  Normal RA size.  There was a catheter ending in the RA (has right upper chest port).  Moderate TR with peak RV-RA gradient 29 mmHg.  Trivial MR.  Trileaflet aortic valve with no regurgitation or stenosis.  Normal caliber aorta with grade III plaque in the descending thoracic aorta.  Negative bubble study, no ASD or PFO.   No LA appendage thrombus, may proceed with atrial fibrillation ablation.   Patty [name] 08/19/2015 8:45 AM

## 2015-08-19 NOTE — Interval H&P Note (Signed)
History and Physical Interval Note:  08/19/2015 7:44 AM  Patty [name]  has presented today for surgery, with the diagnosis of afib  The various methods of treatment have been discussed with the patient and family. After consideration of risks, benefits and other options for treatment, the patient has consented to  Procedure(s): Atrial Fibrillation Ablation (N/A) as a surgical intervention .  The patient's history has been reviewed, patient examined, no change in status, stable for surgery.  I have reviewed the patient's chart and labs.  Questions were answered to the patient's satisfaction.    Risk, benefits, and alternatives to TEE, EP study and radiofrequency ablation for afib were again discussed in detail today. These risks include but are not limited to stroke, bleeding, vascular damage, tamponade, perforation, damage to the esophagus, lungs, and other structures, pulmonary vein stenosis, worsening renal function, and death. The patient understands these risk and wishes to proceed.  Reports compliance with xarelto without interruption.  Thompson Grayer

## 2015-08-19 NOTE — Interval H&P Note (Signed)
History and Physical Interval Note:  08/19/2015 8:22 AM  Patty [name]  has presented today for surgery, with the diagnosis of AFIB  The various methods of treatment have been discussed with the patient and family. After consideration of risks, benefits and other options for treatment, the patient has consented to  Procedure(s): TRANSESOPHAGEAL ECHOCARDIOGRAM (TEE) (N/A) as a surgical intervention .  The patient's history has been reviewed, patient examined, no change in status, stable for surgery.  I have reviewed the patient's chart and labs.  Questions were answered to the patient's satisfaction.     Dachelle Molzahn [employer]

## 2015-08-19 NOTE — Progress Notes (Signed)
Site area: RFV x 3 Site Prior to Removal:  Level 0 Pressure Applied For:57min Manual: yes   Patient Status During Pull:  stable Post Pull Site:  Level 0 Post Pull Instructions Given:  yes Post Pull Pulses Present: palpable Dressing Applied: tegaderm  Bedrest begins @ [id number] till 2055 Comments:

## 2015-08-19 NOTE — Anesthesia Procedure Notes (Signed)
Procedure Name: LMA Insertion Date/Time: 08/19/2015 10:42 AM Performed by: Rush Farmer E Pre-anesthesia Checklist: Patient identified, Emergency Drugs available, Suction available, Patient being monitored and Timeout performed Patient Re-evaluated:Patient Re-evaluated prior to inductionOxygen Delivery Method: Circle system utilized Preoxygenation: Pre-oxygenation with 100% oxygen Intubation Type: IV induction LMA: LMA inserted LMA Size: 4.0 Number of attempts: 1 Placement Confirmation: positive ETCO2 and breath sounds checked- equal and bilateral Tube secured with: Tape Dental Injury: Teeth and Oropharynx as per pre-operative assessment

## 2015-08-19 NOTE — Discharge Instructions (Signed)
No driving for 4 days. No lifting over 5 lbs for 1 week. No vigorous or sexual activity for 1 week. You may return to work on 08/26/15. Keep procedure site clean & dry. If you notice increased pain, swelling, bleeding or pus, call/return!  You may shower, but no soaking baths/hot tubs/pools for 1 week.     You have an appointment set up with the [facility].  Multiple studies have shown that being followed by a dedicated atrial fibrillation clinic in addition to the standard care you receive from your other physicians improves health. We believe that enrollment in the atrial fibrillation clinic will allow [place] to better care for you.   The phone number to the [facility] is [phone]. The clinic is staffed Monday through Friday from 8:30am to 5pm.  Parking Directions: The clinic is located in the Heart and Vascular Building connected to [facility]. 1)From [address] turn on to [address] and go to the 3rd entrance  (Heart and Vascular entrance) on the right. 2)Look to the right for Heart &Vascular Parking Garage. 3)A code for the entrance is required please call the clinic to receive this.   4)Take the elevators to the 1st floor. Registration is in the room with the glass walls at the end of the hallway.  If you have any trouble parking or locating the clinic, please dont hesitate to call [phone].

## 2015-08-19 NOTE — Anesthesia Preprocedure Evaluation (Addendum)
Anesthesia Evaluation  Patient identified by MRN, date of birth, ID band Patient awake    Reviewed: Allergy & Precautions, NPO status , Patient's Chart, lab work & pertinent test results  Airway Mallampati: III   Neck ROM: Full    Dental  (+) Teeth Intact, Caps   Pulmonary sleep apnea ,    breath sounds clear to auscultation       Cardiovascular hypertension, + dysrhythmias Atrial Fibrillation  Rhythm:Irregular Rate:Abnormal     Neuro/Psych negative neurological ROS  negative psych ROS   GI/Hepatic negative GI ROS, Neg liver ROS,   Endo/Other  diabetes, Type 2, Oral Hypoglycemic AgentsHypothyroidism   Renal/GU   negative genitourinary   Musculoskeletal  (+) Arthritis ,   Abdominal   Peds negative pediatric ROS (+)  Hematology negative hematology ROS (+)   Anesthesia Other Findings   Reproductive/Obstetrics negative OB ROS                          Anesthesia Physical Anesthesia Plan  ASA: III  Anesthesia Plan: MAC   Post-op Pain Management:    Induction: Intravenous  Airway Management Planned: Simple Face Mask and Natural Airway  Additional Equipment:   Intra-op Plan:   Post-operative Plan:   Informed Consent: I have reviewed the patients History and Physical, chart, labs and discussed the procedure including the risks, benefits and alternatives for the proposed anesthesia with the patient or authorized representative who has indicated his/her understanding and acceptance.   Dental advisory given  Plan Discussed with: CRNA  Anesthesia Plan Comments: (Possible LMA)       Anesthesia Quick Evaluation

## 2015-08-19 NOTE — Anesthesia Postprocedure Evaluation (Signed)
Anesthesia Post Note  Patient: Patty [name]  Procedure(s) Performed: Procedure(s) (LRB): Atrial Fibrillation Ablation (N/A)  Patient location during evaluation: PACU Anesthesia Type: General Level of consciousness: awake and alert Pain management: pain level controlled Vital Signs Assessment: post-procedure vital signs reviewed and stable Respiratory status: spontaneous breathing, nonlabored ventilation, respiratory function stable and patient connected to nasal cannula oxygen Cardiovascular status: blood pressure returned to baseline and stable Postop Assessment: no signs of nausea or vomiting Anesthetic complications: no    Last Vitals:  Filed Vitals:   08/19/15 1405 08/19/15 1420  BP: 118/68 129/72  Pulse: 82 82  Temp:    Resp: 14 16    Last Pain: There were no vitals filed for this visit.               Effie Berkshire

## 2015-08-19 NOTE — Progress Notes (Signed)
ELECTROPHYSIOLOGY PROCEDURE DISCHARGE SUMMARY    Patient ID: Patty [name],  MRN: [id number], DOB/AGE: [birth year] 75 y.o.  Admit date: 08/19/2015 Discharge date: 08/20/2015  Primary Care Physician: Horatio Pel, MD Primary Cardiologist: Dr. Einar Gip Electrophysiologist: Thompson Grayer, MD  Primary Discharge Diagnosis:  1. Paroxysmal Afib     CHA2DS2Vasc is at least 5, on Xarelto  Secondary Discharge Diagnosis:  1. HTN 2. DM 3. Hypothyroidism 4. OSA w/CPAP   Procedures This Admission:  1.  Electrophysiology study and radiofrequency catheter ablation on 08/19/15 by Dr Thompson Grayer.   This study demonstrated: CONCLUSIONS: 1. Isthmus dependant right atrial flutter upon presentation successfully terminated with CTI ablation.  2. Rotational Angiography reveals a moderate sized left atrium with four separate pulmonary veins without evidence of pulmonary vein stenosis. 3. Successful electrical isolation and anatomical encircling of all four pulmonary veins with radiofrequency current.  4. Cavo-tricuspid isthmus ablation was performed with complete bidirectional isthmus block achieved.  5. No inducible arrhythmias following ablation both on and off of Isuprel 6. No early apparent complications.  Brief HPI: Patty [name] is a 75 y.o. female with a history of paroxysmal atrial fibrillation.  They have failed medical therapy with Flecainide. Risks, benefits, and alternatives to catheter ablation of atrial fibrillation were reviewed with the patient who wished to proceed.  The patient underwent TEE prior to the procedure which demonstrated normal LV function and no LAA thrombus.    Hospital Course:  The patient was admitted and underwent EPS/RFCA of atrial fibrillation with details as outlined above.  They were monitored on telemetry overnight which demonstrated SR.  Groin was without complication on the day of discharge.  The patient was examined by Dr. Rayann Heman and  considered to be stable for discharge.  Wound care and restrictions were reviewed with the patient.  The patient will be seen back by Roderic Palau, NP in 4 weeks and Dr Rayann Heman in 12 weeks for post ablation follow up.    Physical Exam: Filed Vitals:   08/19/15 2152 08/20/15 0045 08/20/15 0425 08/20/15 0756  BP: 139/77 129/80 131/67 147/76  Pulse: 94 92 92   Temp:  98.4 F (36.9 C) 98.7 F (37.1 C)   TempSrc:  Oral Oral   Resp: 22 27 11 21   Height:      Weight:   143 lb 4.8 oz (65 kg)   SpO2: 93% 94% 93%     GEN- The patient is well appearing, alert and oriented x 3 today.   HEENT: normocephalic, atraumatic; sclera clear, conjunctiva pink; hearing intact; oropharynx clear; neck supple  Lungs- Clear to ausculation bilaterally, normal work of breathing.  No wheezes, rales, rhonchi Heart- Regular rate and rhythm, no murmurs, rubs or gallops  GI- soft, non-tender, non-distended, bowel sounds present  Extremities- no clubbing, cyanosis, or edema; DP/PT/radial pulses 2+ bilaterally, groin without hematoma/bruit MS- no significant deformity or atrophy Skin- warm and dry, no rash or lesion Psych- euthymic mood, full affect Neuro- strength and sensation are intact   Labs:   Lab Results  Component Value Date   WBC 6.3 08/08/2015   HGB 10.0* 08/08/2015   HCT 29.5* 08/08/2015   MCV 82.4 08/08/2015   PLT 216 08/08/2015     Recent Labs Lab 08/20/15 0430  NA 137  K 4.1  CL 108  CO2 24  BUN 8  CREATININE 0.73  CALCIUM 8.8*  GLUCOSE 119*     Discharge Medications:    Medication List    TAKE these  medications        acetaminophen 500 MG tablet  Commonly known as:  TYLENOL  Take 1,000 mg by mouth every 6 (six) hours as needed for moderate pain, fever or headache.     anastrozole 1 MG tablet  Commonly known as:  ARIMIDEX  Take 1 tablet (1 mg total) by mouth daily.     cephALEXin 250 MG capsule  Commonly known as:  KEFLEX  Take 250 mg by mouth at bedtime.      diltiazem 120 MG 24 hr capsule  Commonly known as:  CARDIZEM CD  Take 1 capsule (120 mg total) by mouth daily.     diphenoxylate-atropine 2.5-0.025 MG tablet  Commonly known as:  LOMOTIL  Take 1 tablet by mouth 4 (four) times daily as needed for diarrhea or loose stools.     Fluticasone-Salmeterol 250-50 MCG/DOSE Aepb  Commonly known as:  ADVAIR  Inhale 2 puffs into the lungs every 12 (twelve) hours.     HYDROcodone-acetaminophen 5-325 MG tablet  Commonly known as:  NORCO  Take 1-2 tablets by mouth every 6 (six) hours as needed.     levothyroxine 200 MCG tablet  Commonly known as:  SYNTHROID, LEVOTHROID  Take 200 mcg by mouth daily before breakfast.     lidocaine-prilocaine cream  Commonly known as:  EMLA  Apply 1 application topically as needed. Apply to [facility] a Cath site at least one hour prior to needle stick as needed.     Magnesium Oxide 500 MG Tabs  Take 500 mg by mouth daily.     metFORMIN 500 MG 24 hr tablet  Commonly known as:  GLUCOPHAGE-XR  Take 500 mg by mouth 2 (two) times daily with a meal.     montelukast 10 MG tablet  Commonly known as:  SINGULAIR  Take 10 mg by mouth daily.     ondansetron 8 MG tablet  Commonly known as:  ZOFRAN  Take 8 mg by mouth every 8 (eight) hours as needed for nausea or vomiting.     pantoprazole 40 MG tablet  Commonly known as:  PROTONIX  Take 1 tablet (40 mg total) by mouth daily. For 45 days     pravastatin 40 MG tablet  Commonly known as:  PRAVACHOL  Take 20 mg by mouth See admin instructions. Take 1/2 tablet (20 mg) ONLY on Monday, Tuesday, Wednesday, and Thursday Nights.     PROAIR HFA 108 (90 Base) MCG/ACT inhaler  Generic drug:  albuterol  Inhale 2 puffs into the lungs every 6 (six) hours as needed for wheezing or shortness of breath.     albuterol (2.5 MG/3ML) 0.083% nebulizer solution  Commonly known as:  PROVENTIL  Inhale 3 mLs into the lungs every 6 (six) hours as needed for wheezing or shortness of breath.      prochlorperazine 10 MG tablet  Commonly known as:  COMPAZINE  Take 1 tablet (10 mg total) by mouth every 6 (six) hours as needed for nausea (nausea).     rivaroxaban 20 MG Tabs tablet  Commonly known as:  XARELTO  Take 20 mg by mouth daily with supper.     traMADol-acetaminophen 37.5-325 MG tablet  Commonly known as:  ULTRACET  Take 1 tablet by mouth every 6 (six) hours as needed.     vitamin B-12 500 MCG tablet  Commonly known as:  CYANOCOBALAMIN  Take 500 mcg by mouth daily.     Vitamin D 2000 units tablet  Take 2,000 Units by mouth  daily with lunch.        Disposition:  Home Discharge Instructions    Diet - low sodium heart healthy    Complete by:  As directed      Increase activity slowly    Complete by:  As directed           Follow-up Information    Follow up with [facility] On 09/17/2015.   Specialty:  Cardiology   Why:  11:30AM   Contact information:   [id number] [place] [phone]      Follow up with Thompson Grayer, MD On 11/24/2015.   Specialty:  Cardiology   Why:  10:45AM   Contact information:   [place] [address] [phone]       Duration of Discharge Encounter: Greater than 30 minutes including physician time.  Venetia Night, PA-C  08/20/2015 8:19 AM    Thompson Grayer MD, [facility] 08/20/2015 1:37 PM

## 2015-08-20 ENCOUNTER — Encounter (HOSPITAL_COMMUNITY): Payer: Self-pay | Admitting: Internal Medicine

## 2015-08-20 DIAGNOSIS — E039 Hypothyroidism, unspecified: Secondary | ICD-10-CM | POA: Diagnosis not present

## 2015-08-20 DIAGNOSIS — I480 Paroxysmal atrial fibrillation: Secondary | ICD-10-CM | POA: Diagnosis not present

## 2015-08-20 DIAGNOSIS — Z8572 Personal history of non-Hodgkin lymphomas: Secondary | ICD-10-CM | POA: Diagnosis not present

## 2015-08-20 DIAGNOSIS — I481 Persistent atrial fibrillation: Secondary | ICD-10-CM

## 2015-08-20 DIAGNOSIS — I4892 Unspecified atrial flutter: Secondary | ICD-10-CM | POA: Diagnosis not present

## 2015-08-20 DIAGNOSIS — J45909 Unspecified asthma, uncomplicated: Secondary | ICD-10-CM | POA: Diagnosis not present

## 2015-08-20 LAB — GLUCOSE, CAPILLARY: GLUCOSE-CAPILLARY: 121 mg/dL — AB (ref 65–99)

## 2015-08-20 LAB — BASIC METABOLIC PANEL
Anion gap: 5 (ref 5–15)
BUN: 8 mg/dL (ref 6–20)
CALCIUM: 8.8 mg/dL — AB (ref 8.9–10.3)
CHLORIDE: 108 mmol/L (ref 101–111)
CO2: 24 mmol/L (ref 22–32)
CREATININE: 0.73 mg/dL (ref 0.44–1.00)
Glucose, Bld: 119 mg/dL — ABNORMAL HIGH (ref 65–99)
Potassium: 4.1 mmol/L (ref 3.5–5.1)
Sodium: 137 mmol/L (ref 135–145)

## 2015-08-20 MED ORDER — PANTOPRAZOLE SODIUM 40 MG PO TBEC: 40.0000 mg | DELAYED_RELEASE_TABLET | Freq: Every day | ORAL | Status: DC

## 2015-08-20 MED ORDER — DILTIAZEM HCL ER COATED BEADS 120 MG PO CP24: 120.0000 mg | ORAL_CAPSULE | Freq: Every day | ORAL | Status: DC

## 2015-08-20 NOTE — Progress Notes (Signed)
Doing well s/p ablation No concerns overnight  Discharge to home Reduce diltiazem to 120mg  daily  Routine follow-up and post procedure care  Thompson Grayer MD, [facility] 08/20/2015 8:16 AM

## 2015-08-21 DIAGNOSIS — E039 Hypothyroidism, unspecified: Secondary | ICD-10-CM | POA: Diagnosis not present

## 2015-08-21 DIAGNOSIS — R05 Cough: Secondary | ICD-10-CM | POA: Diagnosis not present

## 2015-08-21 DIAGNOSIS — R197 Diarrhea, unspecified: Secondary | ICD-10-CM | POA: Diagnosis not present

## 2015-08-21 DIAGNOSIS — R509 Fever, unspecified: Secondary | ICD-10-CM | POA: Diagnosis not present

## 2015-08-22 DIAGNOSIS — N390 Urinary tract infection, site not specified: Secondary | ICD-10-CM | POA: Diagnosis not present

## 2015-08-22 DIAGNOSIS — J45909 Unspecified asthma, uncomplicated: Secondary | ICD-10-CM | POA: Diagnosis not present

## 2015-08-22 DIAGNOSIS — D649 Anemia, unspecified: Secondary | ICD-10-CM | POA: Diagnosis not present

## 2015-08-27 ENCOUNTER — Other Ambulatory Visit: Payer: Self-pay | Admitting: *Deleted

## 2015-08-27 DIAGNOSIS — C8591 Non-Hodgkin lymphoma, unspecified, lymph nodes of head, face, and neck: Secondary | ICD-10-CM

## 2015-08-28 ENCOUNTER — Ambulatory Visit: Payer: Medicare Other

## 2015-08-28 ENCOUNTER — Telehealth: Payer: Self-pay | Admitting: *Deleted

## 2015-08-28 ENCOUNTER — Ambulatory Visit (HOSPITAL_BASED_OUTPATIENT_CLINIC_OR_DEPARTMENT_OTHER): Payer: Medicare Other | Admitting: Hematology and Oncology

## 2015-08-28 ENCOUNTER — Telehealth: Payer: Self-pay | Admitting: Hematology and Oncology

## 2015-08-28 ENCOUNTER — Encounter: Payer: Self-pay | Admitting: Hematology and Oncology

## 2015-08-28 ENCOUNTER — Other Ambulatory Visit (HOSPITAL_BASED_OUTPATIENT_CLINIC_OR_DEPARTMENT_OTHER): Payer: Medicare Other

## 2015-08-28 VITALS — BP 119/58 | HR 90 | Temp 98.3°F | Resp 16 | Ht 66.5 in | Wt 146.6 lb

## 2015-08-28 VITALS — BP 119/58 | HR 90 | Temp 98.3°F | Resp 16

## 2015-08-28 DIAGNOSIS — C8591 Non-Hodgkin lymphoma, unspecified, lymph nodes of head, face, and neck: Secondary | ICD-10-CM

## 2015-08-28 DIAGNOSIS — C50211 Malignant neoplasm of upper-inner quadrant of right female breast: Secondary | ICD-10-CM

## 2015-08-28 DIAGNOSIS — D801 Nonfamilial hypogammaglobulinemia: Secondary | ICD-10-CM | POA: Diagnosis not present

## 2015-08-28 DIAGNOSIS — D638 Anemia in other chronic diseases classified elsewhere: Secondary | ICD-10-CM

## 2015-08-28 DIAGNOSIS — Z79811 Long term (current) use of aromatase inhibitors: Secondary | ICD-10-CM

## 2015-08-28 DIAGNOSIS — Z8572 Personal history of non-Hodgkin lymphomas: Secondary | ICD-10-CM

## 2015-08-28 DIAGNOSIS — M8588 Other specified disorders of bone density and structure, other site: Secondary | ICD-10-CM | POA: Diagnosis not present

## 2015-08-28 LAB — COMPREHENSIVE METABOLIC PANEL
ALBUMIN: 3.7 g/dL (ref 3.5–5.0)
ALK PHOS: 84 U/L (ref 40–150)
ALT: 15 U/L (ref 0–55)
AST: 18 U/L (ref 5–34)
Anion Gap: 10 mEq/L (ref 3–11)
BILIRUBIN TOTAL: 0.35 mg/dL (ref 0.20–1.20)
BUN: 10.8 mg/dL (ref 7.0–26.0)
CALCIUM: 9.5 mg/dL (ref 8.4–10.4)
CO2: 25 mEq/L (ref 22–29)
CREATININE: 0.8 mg/dL (ref 0.6–1.1)
Chloride: 105 mEq/L (ref 98–109)
EGFR: 75 mL/min/{1.73_m2} — ABNORMAL LOW (ref >90)
Glucose: 129 mg/dl (ref 70–140)
Potassium: 3.5 mEq/L (ref 3.5–5.1)
Sodium: 140 mEq/L (ref 136–145)
TOTAL PROTEIN: 6.6 g/dL (ref 6.4–8.3)

## 2015-08-28 LAB — CBC WITH DIFFERENTIAL/PLATELET
BASO%: 0.3 % (ref 0.0–2.0)
Basophils Absolute: 0 10*3/uL (ref 0.0–0.1)
EOS%: 2.8 % (ref 0.0–7.0)
Eosinophils Absolute: 0.2 10*3/uL (ref 0.0–0.5)
HEMATOCRIT: 29.9 % — AB (ref 34.8–46.6)
HEMOGLOBIN: 9.6 g/dL — AB (ref 11.6–15.9)
LYMPH#: 1.4 10*3/uL (ref 0.9–3.3)
LYMPH%: 16.2 % (ref 14.0–49.7)
MCH: 27.7 pg (ref 25.1–34.0)
MCHC: 32.1 g/dL (ref 31.5–36.0)
MCV: 86.4 fL (ref 79.5–101.0)
MONO#: 0.7 10*3/uL (ref 0.1–0.9)
MONO%: 8.4 % (ref 0.0–14.0)
NEUT%: 72.3 % (ref 38.4–76.8)
NEUTROS ABS: 6.2 10*3/uL (ref 1.5–6.5)
Platelets: 264 10*3/uL (ref 145–400)
RBC: 3.46 10*6/uL — ABNORMAL LOW (ref 3.70–5.45)
RDW: 15.6 % — AB (ref 11.2–14.5)
WBC: 8.6 10*3/uL (ref 3.9–10.3)

## 2015-08-28 MED ORDER — HEPARIN SOD (PORK) LOCK FLUSH 100 UNIT/ML IV SOLN
500.0000 [IU] | Freq: Once | INTRAVENOUS | Status: AC
  Administered 2015-08-28: 500 [IU]
  Filled 2015-08-28: qty 5

## 2015-08-28 MED ORDER — SODIUM CHLORIDE 0.9 % IJ SOLN
10.0000 mL | Freq: Once | INTRAMUSCULAR | Status: AC
Start: 2015-08-28 — End: 2015-08-28
  Administered 2015-08-28: 10 mL
  Filled 2015-08-28: qty 10

## 2015-08-28 NOTE — Assessment & Plan Note (Signed)
This is likely due to recent treatment and anemia of chronic disease. The patient denies recent history of bleeding such as epistaxis, hematuria or hematochezia. She is asymptomatic from the anemia. I will observe for now.  

## 2015-08-28 NOTE — Telephone Encounter (Signed)
per pof to sch pt appt-gave pt copy of avs-MW sch trmt °

## 2015-08-28 NOTE — Assessment & Plan Note (Signed)
Her recent CT scan from December 2016 show no evidence of recurrence of lymphoma I plan to observe only.

## 2015-08-28 NOTE — Assessment & Plan Note (Signed)
This is due to rituximab. She also had mild infusion reaction with prior IVIG. I plan to reduce the dose to 400 mg/kg 1 dose monthly along with premedication. She will get it tomorrow She is currently on prophylactic therapy with Keflex to reduce recurrent sepsis Her last IgG level was adequate after replacement therapy. However, since I hold her dose in May, she have recurrent infection again. I recommend IVIG monthly and I'll see her back next month prior to her treatment in July.

## 2015-08-28 NOTE — Telephone Encounter (Signed)
Per staff message and POF I have scheduled appts. Advised scheduler of appts. JMW  

## 2015-08-28 NOTE — Assessment & Plan Note (Signed)
I reviewed the most recent bone density scan. She has excellent bone density in her spine but mild osteopenia in her femur. She is taking high-dose vitamin D and calcium. I'm comfortable recommending her to start Arimidex on 07/14/2015 as part of the adjuvant treatment for breast cancer. So far, she tolerated treatment well without any side effects. I will continue to see her on the regular basis including breast examination and will make sure that she get regular screening mammogram

## 2015-08-28 NOTE — Progress Notes (Signed)
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   01/03/2014 Bone Marrow Biopsy Accession: [id number] Bone marrow biopsy is negative   01/04/2014 Imaging ECHO showed normal EF   01/04/2014 Procedure She has placement of port   01/09/2014 - 03/07/2014 Chemotherapy She was given treatment with bendamustine with rituximab. Treatment was stopped due to severe side-effects despite significant dose adjustment for cycle 2   01/18/2014 - 02/01/2014 Hospital Admission She was admitted to the hospital from Escherichia coli sepsis with multiorgan failure and brief episodes of intubation. She was discharged to skilled nursing facility   02/26/2014 Imaging PET/CT scan showed near complete remission   02/27/2014 Adverse Reaction Cycle 2 of treatment was resumed with  drastic dose adjustment to bendamustine due to recent multi-organ failure   04/03/2014 - 03/13/2015 Chemotherapy She is started on maintenance rituximab only.   04/09/2014 - 04/12/2014 Hospital Admission The patient was admitted to the hospital with urinary tract infection and sepsis.   06/05/2014 Imaging PET CT scan showed complete response to Rx   02/12/2015 Imaging Ct scan showed no evidence of disease. It shows she has new pneumonia   04/02/2015 Miscellaneous She received 1 dose IVIG complicated by mild infusion reaction   04/26/2015 - 04/30/2015 Hospital Admission She had recurrent admission to the hospital with sepsis    Breast cancer of upper-inner quadrant of right female breast ([place])   11/06/2014 Imaging DEXA scan showed osteopenia T-1.9 on bilateral femoral neck   03/31/2015 Imaging Screening mammogram showed suspicious lesion, confirmed on diagnostic imaging at 2 and 230 position on the right breast   04/10/2015 Pathology Results Accession: [id number] breast biopsy in 2 locations came back invasive ductal carcinoma with calcification, 100% ER and PR positive, HER 2 neg   04/10/2015 Clinical Stage Stage IA: T1 N0   05/27/2015 Surgery Right total mastectomy: multifocal IDC, 1.5 and 1.0 cm, neg margins, ER+ (100% - both); PR+ (100% and 95%), HER2neu negative (ratio 1.69 and 1.14) Ki67 2% and 10%. DCIS   05/27/2015 Pathologic Stage Stage IA: mpT1c pNx pMx   06/17/2015 Survivorship Survivorship care plan mailed to patient at her request   07/14/2015 -  Anti-estrogen oral therapy She started taking Arimidex    INTERVAL HISTORY: Please see below for problem oriented charting. She returns for further follow-up. She has recent cardiac ablation therapy for chronic atrial fibrillation. She is doing well. She started taking Arimidex  6 weeks ago without any side effects such as hot flashes, mood swings, arthralgias or other problems. Unfortunately, she developed recurrent infection requiring different antibiotics for  bronchitis and UTI.  REVIEW OF SYSTEMS:   Constitutional: Denies fevers, chills or abnormal weight loss Eyes: Denies blurriness of vision Ears, nose, mouth, throat, and face: Denies mucositis or sore throat Respiratory: Denies cough, dyspnea or wheezes Cardiovascular: Denies palpitation, chest discomfort or lower extremity swelling Gastrointestinal:  Denies nausea, heartburn or change in bowel habits Skin: Denies abnormal skin rashes Lymphatics: Denies new lymphadenopathy or easy bruising Neurological:Denies numbness, tingling or new weaknesses Behavioral/Psych: Mood is stable, no new changes  All other systems were reviewed with the patient and are negative.  I have reviewed the past medical history, past surgical history, social history and family history with the patient and they are unchanged from previous note.  ALLERGIES:  is allergic to morphine and related; multaq; demerol; oysters; penicillins; and sulfa drugs cross reactors.  MEDICATIONS:  Current Outpatient Prescriptions  Medication Sig Dispense Refill  . acetaminophen (TYLENOL) 500 MG tablet Take 1,000 mg by mouth every 6 (six) hours as needed for moderate pain, fever or headache.    . albuterol (PROVENTIL) (2.5 MG/3ML) 0.083% nebulizer solution Inhale 3 mLs into the lungs every 6 (six) hours as needed for wheezing or shortness of breath.     . anastrozole (ARIMIDEX) 1 MG tablet Take 1 tablet (1 mg total) by mouth daily. 90 tablet 3  . cephALEXin (KEFLEX) 250 MG capsule Take 250 mg by mouth at bedtime.     . Cholecalciferol (VITAMIN D) 2000 UNITS tablet Take 2,000 Units by mouth daily with lunch.    . diltiazem (CARDIZEM CD) 120 MG 24 hr capsule Take 1 capsule (120 mg total) by mouth daily. 30 capsule 6  . diphenoxylate-atropine (LOMOTIL) 2.5-0.025 MG per tablet Take 1 tablet by mouth 4 (four) times daily as needed for diarrhea or loose stools. 90 tablet 0  . Fluticasone-Salmeterol (ADVAIR) 250-50 MCG/DOSE AEPB Inhale 2 puffs  into the lungs every 12 (twelve) hours.     Marland Kitchen levothyroxine (SYNTHROID, LEVOTHROID) 200 MCG tablet Take 200 mcg by mouth daily before breakfast.    . lidocaine-prilocaine (EMLA) cream Apply 1 application topically as needed. Apply to [facility] a Cath site at least one hour prior to needle stick as needed. 30 g 10  . Magnesium Oxide 500 MG TABS Take 500 mg by mouth daily.     . metFORMIN (GLUCOPHAGE-XR) 500 MG 24 hr tablet Take 500 mg by mouth 2 (two) times daily with a meal.    . montelukast (SINGULAIR) 10 MG tablet Take 10 mg by mouth daily.    . ondansetron (ZOFRAN) 8 MG tablet Take 8 mg by mouth every 8 (eight) hours as needed for nausea or vomiting.    . pantoprazole (PROTONIX) 40 MG tablet Take 1 tablet (40 mg total) by mouth daily. For 45 days 45 tablet 0  . pravastatin (PRAVACHOL) 40 MG tablet Take 20 mg by mouth See admin instructions. Take 1/2 tablet (20 mg) ONLY on Monday, Tuesday, Wednesday, and Thursday Nights.    . prochlorperazine (COMPAZINE) 10 MG tablet Take 1 tablet (10 mg total) by mouth every 6 (six) hours as needed for nausea (nausea). 30 tablet 6  . rivaroxaban (XARELTO) 20 MG TABS tablet Take 20 mg by mouth daily with supper.    . traMADol-acetaminophen (ULTRACET) 37.5-325 MG per tablet Take 1 tablet by mouth every 6 (six) hours as needed. (Patient  taking differently: Take 1 tablet by mouth every 6 (six) hours as needed for moderate pain. ) 30 tablet 0  . vitamin B-12 (CYANOCOBALAMIN) 500 MCG tablet Take 500 mcg by mouth daily.     No current facility-administered medications for this visit.    PHYSICAL EXAMINATION: ECOG PERFORMANCE STATUS: 0 - Asymptomatic  Filed Vitals:   08/28/15 0842  BP: 119/58  Pulse: 90  Temp: 98.3 F (36.8 C)  Resp: 16   Filed Weights   08/28/15 0842  Weight: 146 lb 9.6 oz (66.497 kg)    GENERAL:alert, no distress and comfortable.  SKIN: skin color, texture, turgor are normal, no rashes or significant lesions EYES: normal, Conjunctiva are  pink and non-injected, sclera clear Musculoskeletal:no cyanosis of digits and no clubbing  NEURO: alert & oriented x 3 with fluent speech, no focal motor/sensory deficits  LABORATORY DATA:  I have reviewed the data as listed    Component Value Date/Time   NA 140 08/28/2015 0757   NA 137 08/20/2015 0430   K 3.5 08/28/2015 0757   K 4.1 08/20/2015 0430   CL 108 08/20/2015 0430   CO2 25 08/28/2015 0757   CO2 24 08/20/2015 0430   GLUCOSE 129 08/28/2015 0757   GLUCOSE 119* 08/20/2015 0430   BUN 10.8 08/28/2015 0757   BUN 8 08/20/2015 0430   CREATININE 0.8 08/28/2015 0757   CREATININE 0.73 08/20/2015 0430   CREATININE 0.78 08/08/2015 1111   CALCIUM 9.5 08/28/2015 0757   CALCIUM 8.8* 08/20/2015 0430   PROT 6.6 08/28/2015 0757   PROT 7.3 04/26/2015 2141   ALBUMIN 3.7 08/28/2015 0757   ALBUMIN 3.5 04/26/2015 2141   AST 18 08/28/2015 0757   AST 44* 04/26/2015 2141   ALT 15 08/28/2015 0757   ALT 23 04/26/2015 2141   ALKPHOS 84 08/28/2015 0757   ALKPHOS 60 04/26/2015 2141   BILITOT 0.35 08/28/2015 0757   BILITOT 0.5 04/26/2015 2141   GFRNONAA >60 08/20/2015 0430   GFRAA >60 08/20/2015 0430    No results found for: SPEP, UPEP  Lab Results  Component Value Date   WBC 8.6 08/28/2015   NEUTROABS 6.2 08/28/2015   HGB 9.6* 08/28/2015   HCT 29.9* 08/28/2015   MCV 86.4 08/28/2015   PLT 264 08/28/2015      Chemistry      Component Value Date/Time   NA 140 08/28/2015 0757   NA 137 08/20/2015 0430   K 3.5 08/28/2015 0757   K 4.1 08/20/2015 0430   CL 108 08/20/2015 0430   CO2 25 08/28/2015 0757   CO2 24 08/20/2015 0430   BUN 10.8 08/28/2015 0757   BUN 8 08/20/2015 0430   CREATININE 0.8 08/28/2015 0757   CREATININE 0.73 08/20/2015 0430   CREATININE 0.78 08/08/2015 1111      Component Value Date/Time   CALCIUM 9.5 08/28/2015 0757   CALCIUM 8.8* 08/20/2015 0430   ALKPHOS 84 08/28/2015 0757   ALKPHOS 60 04/26/2015 2141   AST 18 08/28/2015 0757   AST 44* 04/26/2015 2141    ALT 15 08/28/2015 0757   ALT 23 04/26/2015 2141   BILITOT 0.35 08/28/2015 0757   BILITOT 0.5 04/26/2015 2141      ASSESSMENT & PLAN:  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  Breast cancer of upper-inner quadrant of right female breast ([place]) I reviewed the most recent bone  density scan. She has excellent bone density in her spine but mild osteopenia in her femur. She is taking high-dose vitamin D and calcium. I'm comfortable recommending her to start Arimidex on 07/14/2015 as part of the adjuvant treatment for breast cancer. So far, she tolerated treatment well without any side effects. 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 She also had mild infusion reaction with prior IVIG. I plan to reduce the dose to 400 mg/kg 1 dose monthly along with premedication. She will get it tomorrow She is currently on prophylactic therapy with Keflex to reduce recurrent sepsis Her last IgG level was adequate after replacement therapy. However, since I hold her dose in May, she have recurrent infection again. I recommend IVIG monthly and I'll see her back next month prior to her treatment in July.  Anemia in chronic illness This is likely due to recent treatment and anemia of chronic disease. The patient denies recent history of bleeding such as epistaxis, hematuria or hematochezia. She is asymptomatic from the anemia. I will observe for now.      Orders Placed This Encounter  Procedures  . IgG    Standing Status: Future     Number of Occurrences:      Standing Expiration Date: 08/27/2016   All questions were answered. The patient knows to call the clinic with any problems, questions or concerns. No  barriers to learning was detected. I spent 15 minutes counseling the patient face to face. The total time spent in the appointment was 20 minutes and more than 50% was on counseling and review of test results     [facility], [place], MD 08/28/2015 5:54 PM

## 2015-08-29 ENCOUNTER — Ambulatory Visit (HOSPITAL_BASED_OUTPATIENT_CLINIC_OR_DEPARTMENT_OTHER): Payer: Medicare Other

## 2015-08-29 VITALS — BP 120/64 | HR 87 | Temp 98.2°F | Resp 18

## 2015-08-29 DIAGNOSIS — D801 Nonfamilial hypogammaglobulinemia: Secondary | ICD-10-CM

## 2015-08-29 DIAGNOSIS — C8591 Non-Hodgkin lymphoma, unspecified, lymph nodes of head, face, and neck: Secondary | ICD-10-CM

## 2015-08-29 MED ORDER — HEPARIN SOD (PORK) LOCK FLUSH 100 UNIT/ML IV SOLN
500.0000 [IU] | Freq: Once | INTRAVENOUS | Status: AC | PRN
  Administered 2015-08-29: 500 [IU] via INTRAVENOUS
  Filled 2015-08-29: qty 5

## 2015-08-29 MED ORDER — SODIUM CHLORIDE 0.9 % IV SOLN
10.0000 mg | Freq: Once | INTRAVENOUS | Status: AC
  Administered 2015-08-29: 10 mg via INTRAVENOUS
  Filled 2015-08-29: qty 1

## 2015-08-29 MED ORDER — DIPHENHYDRAMINE HCL 25 MG PO CAPS
25.0000 mg | ORAL_CAPSULE | Freq: Once | ORAL | Status: AC
  Administered 2015-08-29: 25 mg via ORAL

## 2015-08-29 MED ORDER — DIPHENHYDRAMINE HCL 25 MG PO CAPS
ORAL_CAPSULE | ORAL | Status: AC
  Filled 2015-08-29: qty 1

## 2015-08-29 MED ORDER — SODIUM CHLORIDE 0.9 % IJ SOLN
10.0000 mL | INTRAMUSCULAR | Status: DC | PRN
  Administered 2015-08-29: 10 mL via INTRAVENOUS
  Filled 2015-08-29: qty 10

## 2015-08-29 MED ORDER — IMMUNE GLOBULIN (HUMAN) 10 GM/100ML IV SOLN
400.0000 mg/kg | Freq: Once | INTRAVENOUS | Status: AC
  Administered 2015-08-29: 25 g via INTRAVENOUS
  Filled 2015-08-29: qty 200

## 2015-08-29 NOTE — Patient Instructions (Signed)

## 2015-09-03 DIAGNOSIS — C770 Secondary and unspecified malignant neoplasm of lymph nodes of head, face and neck: Secondary | ICD-10-CM | POA: Diagnosis not present

## 2015-09-03 DIAGNOSIS — A419 Sepsis, unspecified organism: Secondary | ICD-10-CM | POA: Diagnosis not present

## 2015-09-03 DIAGNOSIS — C50911 Malignant neoplasm of unspecified site of right female breast: Secondary | ICD-10-CM | POA: Diagnosis not present

## 2015-09-05 DIAGNOSIS — I440 Atrioventricular block, first degree: Secondary | ICD-10-CM | POA: Diagnosis not present

## 2015-09-05 DIAGNOSIS — I10 Essential (primary) hypertension: Secondary | ICD-10-CM | POA: Diagnosis not present

## 2015-09-05 DIAGNOSIS — I4892 Unspecified atrial flutter: Secondary | ICD-10-CM | POA: Diagnosis not present

## 2015-09-05 DIAGNOSIS — I480 Paroxysmal atrial fibrillation: Secondary | ICD-10-CM | POA: Diagnosis not present

## 2015-09-10 DIAGNOSIS — R3914 Feeling of incomplete bladder emptying: Secondary | ICD-10-CM | POA: Diagnosis not present

## 2015-09-10 DIAGNOSIS — N3020 Other chronic cystitis without hematuria: Secondary | ICD-10-CM | POA: Diagnosis not present

## 2015-09-17 ENCOUNTER — Ambulatory Visit (HOSPITAL_COMMUNITY)
Admission: RE | Admit: 2015-09-17 | Discharge: 2015-09-17 | Disposition: A | Discharge status: Home / Self Care | Payer: Medicare Other | Source: Ambulatory Visit | Attending: Nurse Practitioner | Admitting: Nurse Practitioner

## 2015-09-17 ENCOUNTER — Encounter (HOSPITAL_COMMUNITY): Payer: Self-pay | Admitting: Nurse Practitioner

## 2015-09-17 VITALS — BP 130/62 | HR 77 | Ht 66.5 in | Wt 144.2 lb

## 2015-09-17 DIAGNOSIS — I440 Atrioventricular block, first degree: Secondary | ICD-10-CM | POA: Insufficient documentation

## 2015-09-17 DIAGNOSIS — I481 Persistent atrial fibrillation: Secondary | ICD-10-CM | POA: Diagnosis not present

## 2015-09-17 DIAGNOSIS — I4819 Other persistent atrial fibrillation: Secondary | ICD-10-CM

## 2015-09-17 DIAGNOSIS — I4891 Unspecified atrial fibrillation: Secondary | ICD-10-CM | POA: Diagnosis present

## 2015-09-17 DIAGNOSIS — R9431 Abnormal electrocardiogram [ECG] [EKG]: Secondary | ICD-10-CM | POA: Diagnosis not present

## 2015-09-17 NOTE — Progress Notes (Signed)
Patient ID: Patty [name], female   DOB: Apr 28, 1940, 75 y.o.   MRN: [id number]     Primary Care Physician: Horatio Pel, MD Referring Physician: Dr. Mike Gip Patty [name] is a 75 y.o. female with a h/o afib that is s/p ablation 6/20, that is in the afib clinic for f/u. She reports no swallowing or groin issues. She does not feel afib but has noticed some heart rates elevated to 100. She feels tired but energy is improving since the ablation. She is on xarelto without bleeding issues and is aware not to miss doses, especially for the next 30 days.  Today, she denies symptoms of palpitations, chest pain, shortness of breath, orthopnea, PND, lower extremity edema, dizziness, presyncope, syncope, or neurologic sequela. The patient is tolerating medications without difficulties and is otherwise without complaint today.   Past Medical History  Diagnosis Date  . Persistent atrial fibrillation ([place])   . Asthma   . Hypothyroidism   . Chronic kidney disease     recurring UTI  . Arthritis   . Complication of anesthesia     "morphine made me stop breathing"  . Hypertension   . Shortness of breath dyspnea     "at times"  . History of skin cancer   . Nocturia   . History of transfusion   . Sleep apnea     uses c-pap  . Left atrial enlargement   . Type II diabetes mellitus ([place])   . 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 Skin cancer   . HCAP (healthcare-associated pneumonia) 12/2013    Archie Endo 01/18/2014   Past Surgical History  Procedure Laterality Date  . Appendectomy  1953  . Cholecystectomy  1993  . Back surgery      x2  . Cardiac catheterization  10/10/2008    Nonobstructive CAD  . Foot surgery    . Tonsillectomy    . Pubovaginal sling    . Skin tag removal  2012    leg  . Cardioversion  04/13/2011    Procedure: CARDIOVERSION;  Surgeon: Laverda Page, MD;  Location: [facility] OR;   Service: Cardiovascular;  Laterality: N/A;  . Cataract extraction w/ intraocular lens  implant, bilateral Bilateral   . Mass biopsy Left 12/07/2013    Procedure: EXCISIONAL BIOPSY LEFT NECK MASS ;  Surgeon: Jerrell Belfast, MD;  Location: [facility];  Service: ENT;  Laterality: Left;  . Lumbar laminectomy    . Total mastectomy Right 05/27/2015    Procedure: RIGHT TOTAL MASTECTOMY;  Surgeon: Fanny Skates, MD;  Location: [facility] ORS;  Service: General;  Laterality: Right;  . Cardioversion N/A 06/18/2015    Procedure: CARDIOVERSION;  Surgeon: Adrian Prows, MD;  Location: [facility] ENDOSCOPY;  Service: Cardiovascular;  Laterality: N/A;  . Atrial fibrillation ablation  08/19/2015  . Abdominal hysterectomy  1972    partial  . Mastectomy    . Portacath placement  12/2013    Archie Endo 01/04/2014  . Electrophysiologic study N/A 08/19/2015    Procedure: Atrial Fibrillation Ablation;  Surgeon: Thompson Grayer, MD;  Location: [facility] CV LAB;  Service: Cardiovascular;  Laterality: N/A;  . Tee without cardioversion N/A 08/19/2015    Procedure: TRANSESOPHAGEAL ECHOCARDIOGRAM (TEE);  Surgeon: Larey Dresser, MD;  Location: [facility] ENDOSCOPY;  Service: Cardiovascular;  Laterality: N/A;    Current Outpatient Prescriptions  Medication Sig Dispense Refill  . acetaminophen (TYLENOL)  500 MG tablet Take 1,000 mg by mouth every 6 (six) hours as needed for moderate pain, fever or headache.    . albuterol (PROVENTIL) (2.5 MG/3ML) 0.083% nebulizer solution Inhale 3 mLs into the lungs every 6 (six) hours as needed for wheezing or shortness of breath.     . anastrozole (ARIMIDEX) 1 MG tablet Take 1 tablet (1 mg total) by mouth daily. 90 tablet 3  . cephALEXin (KEFLEX) 250 MG capsule Take 250 mg by mouth at bedtime.     . Cholecalciferol (VITAMIN D) 2000 UNITS tablet Take 2,000 Units by mouth daily with lunch.    . diltiazem (CARDIZEM CD) 120 MG 24 hr capsule Take 1 capsule (120 mg total) by mouth daily. 30 capsule 6  . Fluticasone-Salmeterol  (ADVAIR) 250-50 MCG/DOSE AEPB Inhale 2 puffs into the lungs every 12 (twelve) hours.     Marland Kitchen levothyroxine (SYNTHROID, LEVOTHROID) 200 MCG tablet Take 200 mcg by mouth daily before breakfast.    . lidocaine-prilocaine (EMLA) cream Apply 1 application topically as needed. Apply to [facility] a Cath site at least one hour prior to needle stick as needed. 30 g 10  . Magnesium Oxide 500 MG TABS Take 500 mg by mouth daily.     . metFORMIN (GLUCOPHAGE-XR) 500 MG 24 hr tablet Take 500 mg by mouth 2 (two) times daily with a meal.    . montelukast (SINGULAIR) 10 MG tablet Take 10 mg by mouth daily.    . ondansetron (ZOFRAN) 8 MG tablet Take 8 mg by mouth every 8 (eight) hours as needed for nausea or vomiting.    . pantoprazole (PROTONIX) 40 MG tablet Take 1 tablet (40 mg total) by mouth daily. For 45 days 45 tablet 0  . pravastatin (PRAVACHOL) 40 MG tablet Take 20 mg by mouth See admin instructions. Take 1/2 tablet (20 mg) ONLY on Monday, Tuesday, Wednesday, and Thursday Nights.    . rivaroxaban (XARELTO) 20 MG TABS tablet Take 20 mg by mouth daily with supper.    . traMADol-acetaminophen (ULTRACET) 37.5-325 MG per tablet Take 1 tablet by mouth every 6 (six) hours as needed. (Patient taking differently: Take 1 tablet by mouth every 6 (six) hours as needed for moderate pain. ) 30 tablet 0  . vitamin B-12 (CYANOCOBALAMIN) 500 MCG tablet Take 500 mcg by mouth daily.    . diphenoxylate-atropine (LOMOTIL) 2.5-0.025 MG per tablet Take 1 tablet by mouth 4 (four) times daily as needed for diarrhea or loose stools. (Patient not taking: Reported on 09/17/2015) 90 tablet 0  . prochlorperazine (COMPAZINE) 10 MG tablet Take 1 tablet (10 mg total) by mouth every 6 (six) hours as needed for nausea (nausea). (Patient not taking: Reported on 09/17/2015) 30 tablet 6   No current facility-administered medications for this encounter.    Allergies  Allergen Reactions  . Morphine And Related Anaphylaxis and Other (See Comments)    Pt  states she stopped breathing post op- "went into resp. arrest"  . Multaq [Dronedarone] Other (See Comments)    Reaction:  Blood in urine and elevated liver enzymes.   . Demerol Nausea And Vomiting  . Oysters [Shellfish Allergy] Rash    & CLAMS  . Penicillins Itching and Rash    Has patient had a PCN reaction causing immediate rash, facial/tongue/throat swelling, SOB or lightheadedness with hypotension: No Has patient had a PCN reaction causing severe rash involving mucus membranes or skin necrosis: Yes Has patient had a PCN reaction that required hospitalization No Has patient had  a PCN reaction occurring within the last 10 years: No If all of the above answers are "NO", then may proceed with Cephalosporin use.   [facility] Cross Reactors Rash    Social History   Social History  . Marital Status: Married    Spouse Name: N/A  . Number of Children: 2  . Years of Education: N/A   Occupational History  . Not on file.   Social History Main Topics  . Smoking status: Never Smoker   . Smokeless tobacco: Never Used  . Alcohol Use: No  . Drug Use: No  . Sexual Activity: Not Currently    Birth Control/ Protection: Surgical   Other Topics Concern  . Not on file   Social History Narrative   Pt lives in [place] with spouse.   Retired Therapist, sports.  Previously worked in [employer].    Family History  Problem Relation Age of Onset  . Stroke Father     at 98  . Heart attack Father     at 20  . Hypertension Father   . Heart disease Mother   . Arthritis Mother   . Breast cancer Mother     Age 52  . Heart failure Mother   . Heart attack Brother   . Hypertension Brother   . Diabetes Sister     ROS- All systems are reviewed and negative except as per the HPI above  Physical Exam: Filed Vitals:   09/17/15 1126  BP: 130/62  Pulse: 77  Height: 5' 6.5" (1.689 m)  Weight: 144 lb 3.2 oz (65.409 kg)    GEN- The patient is well appearing, alert and oriented x 3 today.   Head-  normocephalic, atraumatic Eyes-  Sclera clear, conjunctiva pink Ears- hearing intact Oropharynx- clear Neck- supple, no JVP Lymph- no cervical lymphadenopathy Lungs- Clear to ausculation bilaterally, normal work of breathing Heart- Regular rate and rhythm, no murmurs, rubs or gallops, PMI not laterally displaced GI- soft, NT, ND, + BS Extremities- no clubbing, cyanosis, or edema MS- no significant deformity or atrophy Skin- no rash or lesion Psych- euthymic mood, full affect Neuro- strength and sensation are intact  EKG- SR with first degree AV block, LAD, pr int 270 ms, qrs 90 ms, qtc 448 ms Epic records reviewed  Assessment and Plan: 1. Afib s/p ablation Doing well and in SR today Off flecainide at time of ablation Continue diltiazem Continue xarelto  F/u with Dr. Rayann Heman 9/25 afib clinic as needed   Butch Penny C. Carroll, [facility] [address] [place], [place] [address] [phone]

## 2015-09-24 DIAGNOSIS — E119 Type 2 diabetes mellitus without complications: Secondary | ICD-10-CM | POA: Diagnosis not present

## 2015-09-24 DIAGNOSIS — E7800 Pure hypercholesterolemia, unspecified: Secondary | ICD-10-CM | POA: Diagnosis not present

## 2015-09-24 DIAGNOSIS — Z0000 Encounter for general adult medical examination without abnormal findings: Secondary | ICD-10-CM | POA: Diagnosis not present

## 2015-09-24 DIAGNOSIS — I10 Essential (primary) hypertension: Secondary | ICD-10-CM | POA: Diagnosis not present

## 2015-09-25 ENCOUNTER — Encounter: Payer: Self-pay | Admitting: Hematology and Oncology

## 2015-09-25 ENCOUNTER — Telehealth: Payer: Self-pay | Admitting: Hematology and Oncology

## 2015-09-25 ENCOUNTER — Ambulatory Visit (HOSPITAL_BASED_OUTPATIENT_CLINIC_OR_DEPARTMENT_OTHER): Payer: Medicare Other | Admitting: Hematology and Oncology

## 2015-09-25 ENCOUNTER — Ambulatory Visit: Payer: Medicare Other

## 2015-09-25 ENCOUNTER — Telehealth: Payer: Self-pay | Admitting: *Deleted

## 2015-09-25 ENCOUNTER — Other Ambulatory Visit (HOSPITAL_BASED_OUTPATIENT_CLINIC_OR_DEPARTMENT_OTHER): Payer: Medicare Other

## 2015-09-25 ENCOUNTER — Ambulatory Visit (HOSPITAL_BASED_OUTPATIENT_CLINIC_OR_DEPARTMENT_OTHER): Payer: Medicare Other

## 2015-09-25 VITALS — BP 121/58 | HR 89 | Temp 98.0°F | Resp 18 | Ht 66.5 in | Wt 143.6 lb

## 2015-09-25 VITALS — BP 122/55 | HR 80 | Temp 98.7°F | Resp 18

## 2015-09-25 DIAGNOSIS — M8580 Other specified disorders of bone density and structure, unspecified site: Secondary | ICD-10-CM

## 2015-09-25 DIAGNOSIS — I482 Chronic atrial fibrillation, unspecified: Secondary | ICD-10-CM

## 2015-09-25 DIAGNOSIS — D801 Nonfamilial hypogammaglobulinemia: Secondary | ICD-10-CM

## 2015-09-25 DIAGNOSIS — D638 Anemia in other chronic diseases classified elsewhere: Secondary | ICD-10-CM

## 2015-09-25 DIAGNOSIS — C8591 Non-Hodgkin lymphoma, unspecified, lymph nodes of head, face, and neck: Secondary | ICD-10-CM

## 2015-09-25 DIAGNOSIS — C50211 Malignant neoplasm of upper-inner quadrant of right female breast: Secondary | ICD-10-CM

## 2015-09-25 LAB — CBC WITH DIFFERENTIAL/PLATELET
BASO%: 0.6 % (ref 0.0–2.0)
BASOS ABS: 0 10*3/uL (ref 0.0–0.1)
EOS%: 3.2 % (ref 0.0–7.0)
Eosinophils Absolute: 0.2 10*3/uL (ref 0.0–0.5)
HEMATOCRIT: 31.4 % — AB (ref 34.8–46.6)
HGB: 10.3 g/dL — ABNORMAL LOW (ref 11.6–15.9)
LYMPH%: 20.4 % (ref 14.0–49.7)
MCH: 27.6 pg (ref 25.1–34.0)
MCHC: 32.9 g/dL (ref 31.5–36.0)
MCV: 84 fL (ref 79.5–101.0)
MONO#: 0.5 10*3/uL (ref 0.1–0.9)
MONO%: 9.8 % (ref 0.0–14.0)
NEUT#: 3.4 10*3/uL (ref 1.5–6.5)
NEUT%: 66 % (ref 38.4–76.8)
Platelets: 198 10*3/uL (ref 145–400)
RBC: 3.74 10*6/uL (ref 3.70–5.45)
RDW: 16.9 % — ABNORMAL HIGH (ref 11.2–14.5)
WBC: 5.1 10*3/uL (ref 3.9–10.3)
lymph#: 1 10*3/uL (ref 0.9–3.3)

## 2015-09-25 LAB — COMPREHENSIVE METABOLIC PANEL
ALT: 17 U/L (ref 0–55)
AST: 18 U/L (ref 5–34)
Albumin: 4.2 g/dL (ref 3.5–5.0)
Alkaline Phosphatase: 83 U/L (ref 40–150)
Anion Gap: 12 mEq/L — ABNORMAL HIGH (ref 3–11)
BUN: 14.6 mg/dL (ref 7.0–26.0)
CALCIUM: 10.2 mg/dL (ref 8.4–10.4)
CHLORIDE: 103 meq/L (ref 98–109)
CO2: 24 meq/L (ref 22–29)
Creatinine: 0.8 mg/dL (ref 0.6–1.1)
EGFR: 69 mL/min/{1.73_m2} — ABNORMAL LOW (ref >90)
Glucose: 87 mg/dl (ref 70–140)
POTASSIUM: 4.1 meq/L (ref 3.5–5.1)
Sodium: 139 mEq/L (ref 136–145)
Total Bilirubin: 0.44 mg/dL (ref 0.20–1.20)
Total Protein: 7.4 g/dL (ref 6.4–8.3)

## 2015-09-25 MED ORDER — SODIUM CHLORIDE 0.9 % IJ SOLN
10.0000 mL | INTRAMUSCULAR | Status: DC | PRN
  Administered 2015-09-25: 10 mL via INTRAVENOUS
  Filled 2015-09-25: qty 10

## 2015-09-25 MED ORDER — DIPHENHYDRAMINE HCL 25 MG PO CAPS
25.0000 mg | ORAL_CAPSULE | Freq: Once | ORAL | Status: AC
  Administered 2015-09-25: 25 mg via ORAL

## 2015-09-25 MED ORDER — SODIUM CHLORIDE 0.9 % IV SOLN
10.0000 mg | Freq: Once | INTRAVENOUS | Status: AC
  Administered 2015-09-25: 10 mg via INTRAVENOUS
  Filled 2015-09-25: qty 1

## 2015-09-25 MED ORDER — HEPARIN SOD (PORK) LOCK FLUSH 100 UNIT/ML IV SOLN
500.0000 [IU] | Freq: Once | INTRAVENOUS | Status: AC | PRN
  Administered 2015-09-25: 500 [IU] via INTRAVENOUS
  Filled 2015-09-25: qty 5

## 2015-09-25 MED ORDER — IMMUNE GLOBULIN (HUMAN) 10 GM/100ML IV SOLN
400.0000 mg/kg | Freq: Once | INTRAVENOUS | Status: AC
  Administered 2015-09-25: 25 g via INTRAVENOUS
  Filled 2015-09-25: qty 200

## 2015-09-25 NOTE — Telephone Encounter (Signed)
spoke w/ pt confirmed 8/23 & 8/24 apt time

## 2015-09-25 NOTE — Assessment & Plan Note (Signed)
This is likely due to recent treatment and anemia of chronic disease. The patient denies recent history of bleeding such as epistaxis, hematuria or hematochezia. She is asymptomatic from the anemia. I will observe for now.  

## 2015-09-25 NOTE — Progress Notes (Signed)
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     01/03/2014 Bone Marrow Biopsy    Accession: [id number] Bone marrow biopsy is negative     01/04/2014 Imaging    ECHO showed normal EF     01/04/2014 Procedure    She has placement of port     01/09/2014 - 03/07/2014 Chemotherapy    She was given treatment with bendamustine with rituximab. Treatment was stopped due to severe side-effects despite significant dose adjustment for cycle 2     01/18/2014 - 02/01/2014 Hospital Admission    She was admitted to the hospital from Escherichia coli sepsis with multiorgan failure and brief episodes of intubation. She was discharged to skilled nursing facility     02/26/2014 Imaging    PET/CT scan showed near complete remission     02/27/2014 Adverse Reaction    Cycle 2 of treatment was resumed with drastic dose adjustment to bendamustine due to recent multi-organ failure     04/03/2014 - 03/13/2015 Chemotherapy    She is started on maintenance rituximab only.     04/09/2014 - 04/12/2014 Hospital Admission    The patient was admitted to the hospital with urinary tract infection and sepsis.     06/05/2014 Imaging    PET CT scan showed complete response to Rx     02/12/2015 Imaging    Ct scan showed no evidence of disease. It shows she has new pneumonia     04/02/2015 Miscellaneous    She received 1 dose IVIG complicated by mild infusion reaction     04/26/2015 - 04/30/2015 Hospital Admission    She had recurrent admission to the hospital with sepsis      Breast cancer of upper-inner quadrant of right female breast ([place])   11/06/2014 Imaging    DEXA scan showed osteopenia T-1.9 on bilateral femoral neck     03/31/2015 Imaging    Screening mammogram showed suspicious lesion, confirmed on diagnostic imaging at 2 and 230 position on the right breast     04/10/2015 Pathology Results    Accession: [id number] breast biopsy in 2 locations came back invasive ductal carcinoma with calcification, 100% ER and PR positive, HER 2 neg     04/10/2015 Clinical Stage    Stage IA: T1 N0  05/27/2015 Surgery    Right total mastectomy: multifocal IDC, 1.5 and 1.0 cm, neg margins, ER+ (100% - both); PR+ (100% and 95%), HER2neu negative (ratio 1.69 and 1.14) Ki67 2% and 10%. DCIS     05/27/2015 Pathologic Stage    Stage IA: mpT1c pNx pMx     06/17/2015 Survivorship    Survivorship care plan mailed to patient at her request     07/14/2015 -  Anti-estrogen oral therapy    She started taking Arimidex      INTERVAL HISTORY: Please see below for problem oriented charting. She returns for her monthly IVIG treatment. She denies recent infection. Her recent cardiology checkup was good, showing that she is currently in normal sinus rhythm. She  has no new lymphadenopathy.  REVIEW OF SYSTEMS:   Constitutional: Denies fevers, chills or abnormal weight loss Eyes: Denies blurriness of vision Ears, nose, mouth, throat, and face: Denies mucositis or sore throat Respiratory: Denies cough, dyspnea or wheezes Cardiovascular: Denies palpitation, chest discomfort or lower extremity swelling Gastrointestinal:  Denies nausea, heartburn or change in bowel habits Skin: Denies abnormal skin rashes Lymphatics: Denies new lymphadenopathy or easy bruising Neurological:Denies numbness, tingling or new weaknesses Behavioral/Psych: Mood is stable, no new changes  All other systems were reviewed with the patient and are negative.  I have reviewed the past medical history, past surgical history, social history and family history with the patient and they are unchanged from previous note.  ALLERGIES:  is allergic to morphine and related; multaq [dronedarone]; demerol; oysters [shellfish allergy]; penicillins; and sulfa drugs cross reactors.  MEDICATIONS:  Current Outpatient Prescriptions  Medication Sig Dispense Refill  . acetaminophen (TYLENOL) 500 MG tablet Take 1,000 mg by mouth every 6 (six) hours as needed for moderate pain, fever or headache.    . albuterol (PROVENTIL) (2.5 MG/3ML) 0.083% nebulizer solution Inhale 3 mLs into the lungs every 6 (six) hours as needed for wheezing or shortness of breath.     . anastrozole (ARIMIDEX) 1 MG tablet Take 1 tablet (1 mg total) by mouth daily. 90 tablet 3  . cephALEXin (KEFLEX) 250 MG capsule Take 250 mg by mouth at bedtime.     . Cholecalciferol (VITAMIN D) 2000 UNITS tablet Take 2,000 Units by mouth daily with lunch.    . diltiazem (CARDIZEM CD) 120 MG 24 hr capsule Take 1 capsule (120 mg total) by mouth daily. 30 capsule 6  . diphenoxylate-atropine (LOMOTIL) 2.5-0.025 MG per tablet Take 1 tablet by mouth 4 (four) times daily as needed for diarrhea or loose stools. 90 tablet 0  . Fluticasone-Salmeterol  (ADVAIR) 250-50 MCG/DOSE AEPB Inhale 2 puffs into the lungs every 12 (twelve) hours.     Marland Kitchen levothyroxine (SYNTHROID, LEVOTHROID) 200 MCG tablet Take 200 mcg by mouth daily before breakfast.    . lidocaine-prilocaine (EMLA) cream Apply 1 application topically as needed. Apply to [facility] a Cath site at least one hour prior to needle stick as needed. 30 g 10  . Magnesium Oxide 500 MG TABS Take 500 mg by mouth daily.     . metFORMIN (GLUCOPHAGE-XR) 500 MG 24 hr tablet Take 500 mg by mouth 2 (two) times daily with a meal.    . montelukast (SINGULAIR) 10 MG tablet Take 10 mg by mouth daily.    . ondansetron (ZOFRAN) 8 MG tablet Take 8 mg by mouth every 8 (eight) hours as needed for nausea or vomiting.    . pravastatin (PRAVACHOL) 40 MG tablet Take 20 mg by mouth See  admin instructions. Take 1/2 tablet (20 mg) ONLY on Monday, Tuesday, Wednesday, and Thursday Nights.    . prochlorperazine (COMPAZINE) 10 MG tablet Take 1 tablet (10 mg total) by mouth every 6 (six) hours as needed for nausea (nausea). 30 tablet 6  . rivaroxaban (XARELTO) 20 MG TABS tablet Take 20 mg by mouth daily with supper.    . tamsulosin (FLOMAX) 0.4 MG CAPS capsule Take 0.4 mg by mouth at bedtime.    . traMADol-acetaminophen (ULTRACET) 37.5-325 MG per tablet Take 1 tablet by mouth every 6 (six) hours as needed. (Patient taking differently: Take 1 tablet by mouth every 6 (six) hours as needed for moderate pain. ) 30 tablet 0  . vitamin B-12 (CYANOCOBALAMIN) 500 MCG tablet Take 500 mcg by mouth daily.     No current facility-administered medications for this visit.    Facility-Administered Medications Ordered in Other Visits  Medication Dose Route Frequency Provider Last Rate Last Dose  . dexamethasone (DECADRON) 10 mg in sodium chloride 0.9 % 50 mL IVPB  10 mg Intravenous Once Heath Lark, MD   10 mg at 09/25/15 1019  . Immune Globulin 10% (PRIVIGEN) IV infusion 25 g  400 mg/kg Intravenous Once Heath Lark, MD        PHYSICAL  EXAMINATION: ECOG PERFORMANCE STATUS: 0 - Asymptomatic  Vitals:   09/25/15 0944  BP: (!) 121/58  Pulse: 89  Resp: 18  Temp: 98 F (36.7 C)   Filed Weights   09/25/15 0944  Weight: 143 lb 9.6 oz (65.1 kg)    GENERAL:alert, no distress and comfortable SKIN: skin color, texture, turgor are normal, no rashes or significant lesions EYES: normal, Conjunctiva are pink and non-injected, sclera clear OROPHARYNX:no exudate, no erythema and lips, buccal mucosa, and tongue normal  NECK: supple, thyroid normal size, non-tender, without nodularity LYMPH:  no palpable lymphadenopathy in the cervical, axillary or inguinal LUNGS: clear to auscultation and percussion with normal breathing effort HEART: regular rate & rhythm and no murmurs and no lower extremity edema ABDOMEN:abdomen soft, non-tender and normal bowel sounds Musculoskeletal:no cyanosis of digits and no clubbing  NEURO: alert & oriented x 3 with fluent speech, no focal motor/sensory deficits . Well-healed mastectomy scar on the right breast with no other abnormalities LABORATORY DATA:  I have reviewed the data as listed    Component Value Date/Time   NA 139 09/25/2015 0848   K 4.1 09/25/2015 0848   CL 108 08/20/2015 0430   CO2 24 09/25/2015 0848   GLUCOSE 87 09/25/2015 0848   BUN 14.6 09/25/2015 0848   CREATININE 0.8 09/25/2015 0848   CALCIUM 10.2 09/25/2015 0848   PROT 7.4 09/25/2015 0848   ALBUMIN 4.2 09/25/2015 0848   AST 18 09/25/2015 0848   ALT 17 09/25/2015 0848   ALKPHOS 83 09/25/2015 0848   BILITOT 0.44 09/25/2015 0848   GFRNONAA >60 08/20/2015 0430   GFRAA >60 08/20/2015 0430    No results found for: SPEP, UPEP  Lab Results  Component Value Date   WBC 5.1 09/25/2015   NEUTROABS 3.4 09/25/2015   HGB 10.3 (L) 09/25/2015   HCT 31.4 (L) 09/25/2015   MCV 84.0 09/25/2015   PLT 198 09/25/2015      Chemistry      Component Value Date/Time   NA 139 09/25/2015 0848   K 4.1 09/25/2015 0848   CL 108  08/20/2015 0430   CO2 24 09/25/2015 0848   BUN 14.6 09/25/2015 0848   CREATININE 0.8 09/25/2015 0848  Component Value Date/Time   CALCIUM 10.2 09/25/2015 0848   ALKPHOS 83 09/25/2015 0848   AST 18 09/25/2015 0848   ALT 17 09/25/2015 0848   BILITOT 0.44 09/25/2015 0848       ASSESSMENT & PLAN:  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 She has excellent bone density in her spine but mild osteopenia in her femur. She is taking high-dose vitamin D and calcium. I'm comfortable recommending her to start Arimidex on 07/14/2015 as part of the adjuvant treatment for breast cancer. So far, she tolerated treatment well without any side effects. I will continue to see her on the regular basis including breast examination and will make sure that she get regular screening mammogram  Anemia in chronic illness This is likely due to recent treatment and anemia of chronic disease. The patient denies recent history of bleeding such as epistaxis, hematuria or hematochezia. She is asymptomatic from the anemia. I will observe for now.     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 She also had mild infusion reaction with prior IVIG. I plan to reduce the dose to 400 mg/kg 1 dose monthly along with premedication. She will get it today She is currently on prophylactic therapy with Keflex to reduce recurrent sepsis Her last IgG level was adequate after replacement therapy. However, since I hold her dose in May, she have recurrent infection again. I recommend IVIG monthly and I'll see her back next month  Chronic atrial fibrillation ([place]) She has chronic atrial fibrillation, s/p ablation and  now in sinus rhythm Currently, she is on medical management & chronic anticoagulation therapy as directed by cardiologist   Orders Placed This Encounter  Procedures  . CT ABDOMEN PELVIS W CONTRAST    Standing Status:   Future    Standing Expiration Date:   12/25/2016    Order Specific Question:   Reason for Exam (SYMPTOM  OR DIAGNOSIS REQUIRED)    Answer:   follicular lymphoma and staging breast ca    Order Specific Question:   Preferred imaging location?    Answer:   [facility]  . CT CHEST W CONTRAST    Standing Status:   Future    Standing Expiration Date:   11/24/2016    Order Specific Question:   Reason for Exam (SYMPTOM  OR DIAGNOSIS REQUIRED)    Answer:   follicular lymphoma and staging breast ca    Order Specific Question:   Preferred imaging location?    Answer:   [facility]   All questions were answered. The patient knows to call the clinic with any problems, questions or concerns. No barriers to learning was detected. I spent 25 minutes counseling the patient face to face. The total time spent in the appointment was 30 minutes and more than 50% was on counseling and review of test results     [facility], [place], MD 09/25/2015 10:23 AM

## 2015-09-25 NOTE — Patient Instructions (Signed)

## 2015-09-25 NOTE — Assessment & Plan Note (Signed)
She has chronic atrial fibrillation, s/p ablation and now in sinus rhythm Currently, she is on medical management & chronic anticoagulation therapy as directed by cardiologist

## 2015-09-25 NOTE — Telephone Encounter (Signed)
Per staff message and POF I have scheduled appts. Advised scheduler of appts. JMW  

## 2015-09-25 NOTE — Assessment & Plan Note (Signed)
Her recent CT scan from December 2016 show no evidence of recurrence of lymphoma I plan to repeat imaging study next month and to exclude recurrence

## 2015-09-25 NOTE — Assessment & Plan Note (Signed)
This is due to rituximab. She also had mild infusion reaction with prior IVIG. I plan to reduce the dose to 400 mg/kg 1 dose monthly along with premedication. She will get it today She is currently on prophylactic therapy with Keflex to reduce recurrent sepsis Her last IgG level was adequate after replacement therapy. However, since I hold her dose in May, she have recurrent infection again. I recommend IVIG monthly and I'll see her back next month

## 2015-09-25 NOTE — Assessment & Plan Note (Signed)
I reviewed the most recent bone density scan. She has excellent bone density in her spine but mild osteopenia in her femur. She is taking high-dose vitamin D and calcium. I'm comfortable recommending her to start Arimidex on 07/14/2015 as part of the adjuvant treatment for breast cancer. So far, she tolerated treatment well without any side effects. I will continue to see her on the regular basis including breast examination and will make sure that she get regular screening mammogram

## 2015-09-25 NOTE — Patient Instructions (Signed)

## 2015-09-26 LAB — IGG: IgG, Qn, Serum: 726 mg/dL (ref 700–1600)

## 2015-09-29 DIAGNOSIS — M1990 Unspecified osteoarthritis, unspecified site: Secondary | ICD-10-CM | POA: Diagnosis not present

## 2015-09-29 DIAGNOSIS — E11610 Type 2 diabetes mellitus with diabetic neuropathic arthropathy: Secondary | ICD-10-CM | POA: Diagnosis not present

## 2015-09-29 DIAGNOSIS — R5383 Other fatigue: Secondary | ICD-10-CM | POA: Diagnosis not present

## 2015-09-29 DIAGNOSIS — I10 Essential (primary) hypertension: Secondary | ICD-10-CM | POA: Diagnosis not present

## 2015-10-08 ENCOUNTER — Telehealth: Payer: Self-pay | Admitting: *Deleted

## 2015-10-08 DIAGNOSIS — M545 Low back pain: Secondary | ICD-10-CM | POA: Diagnosis not present

## 2015-10-08 DIAGNOSIS — R262 Difficulty in walking, not elsewhere classified: Secondary | ICD-10-CM | POA: Diagnosis not present

## 2015-10-08 DIAGNOSIS — M6281 Muscle weakness (generalized): Secondary | ICD-10-CM | POA: Diagnosis not present

## 2015-10-08 NOTE — Telephone Encounter (Signed)
Pt left VM states she has not been scheduled yet for CT scan ordered by Dr. Alvy Bimler for 8/23.  Will send message to [employer] for prior auth so CT can be scheduled.

## 2015-10-10 ENCOUNTER — Telehealth: Payer: Self-pay | Admitting: *Deleted

## 2015-10-10 NOTE — Telephone Encounter (Signed)
Pt called and left VM stating although her CT scan has not been scheduled yet she wants to pick up her Contrast at our office on Monday.   2 Bottles of Contrast at [employer] for pt to p/u on Monday.   I left pt VM informing her bottles will be left at front desk to pick up.

## 2015-10-13 DIAGNOSIS — M545 Low back pain: Secondary | ICD-10-CM | POA: Diagnosis not present

## 2015-10-13 DIAGNOSIS — R262 Difficulty in walking, not elsewhere classified: Secondary | ICD-10-CM | POA: Diagnosis not present

## 2015-10-13 DIAGNOSIS — M6281 Muscle weakness (generalized): Secondary | ICD-10-CM | POA: Diagnosis not present

## 2015-10-15 DIAGNOSIS — M6281 Muscle weakness (generalized): Secondary | ICD-10-CM | POA: Diagnosis not present

## 2015-10-15 DIAGNOSIS — R262 Difficulty in walking, not elsewhere classified: Secondary | ICD-10-CM | POA: Diagnosis not present

## 2015-10-15 DIAGNOSIS — M545 Low back pain: Secondary | ICD-10-CM | POA: Diagnosis not present

## 2015-10-15 DIAGNOSIS — R3914 Feeling of incomplete bladder emptying: Secondary | ICD-10-CM | POA: Diagnosis not present

## 2015-10-17 ENCOUNTER — Telehealth: Payer: Self-pay | Admitting: Hematology and Oncology

## 2015-10-17 NOTE — Telephone Encounter (Signed)
spoke w/ pt confirmed lab & flush change

## 2015-10-21 ENCOUNTER — Other Ambulatory Visit: Payer: Self-pay | Admitting: Hematology and Oncology

## 2015-10-21 DIAGNOSIS — R262 Difficulty in walking, not elsewhere classified: Secondary | ICD-10-CM | POA: Diagnosis not present

## 2015-10-21 DIAGNOSIS — M545 Low back pain: Secondary | ICD-10-CM | POA: Diagnosis not present

## 2015-10-21 DIAGNOSIS — C8591 Non-Hodgkin lymphoma, unspecified, lymph nodes of head, face, and neck: Secondary | ICD-10-CM

## 2015-10-21 DIAGNOSIS — M6281 Muscle weakness (generalized): Secondary | ICD-10-CM | POA: Diagnosis not present

## 2015-10-21 DIAGNOSIS — D801 Nonfamilial hypogammaglobulinemia: Secondary | ICD-10-CM

## 2015-10-22 ENCOUNTER — Ambulatory Visit: Payer: Medicare Other

## 2015-10-22 ENCOUNTER — Encounter (HOSPITAL_COMMUNITY): Payer: Self-pay

## 2015-10-22 ENCOUNTER — Other Ambulatory Visit: Payer: Medicare Other

## 2015-10-22 ENCOUNTER — Ambulatory Visit (HOSPITAL_COMMUNITY)
Admission: RE | Admit: 2015-10-22 | Discharge: 2015-10-22 | Disposition: A | Discharge status: Home / Self Care | Payer: Medicare Other | Source: Ambulatory Visit | Attending: Hematology and Oncology | Admitting: Hematology and Oncology

## 2015-10-22 ENCOUNTER — Other Ambulatory Visit (HOSPITAL_BASED_OUTPATIENT_CLINIC_OR_DEPARTMENT_OTHER): Payer: Medicare Other

## 2015-10-22 DIAGNOSIS — C8591 Non-Hodgkin lymphoma, unspecified, lymph nodes of head, face, and neck: Secondary | ICD-10-CM

## 2015-10-22 DIAGNOSIS — D801 Nonfamilial hypogammaglobulinemia: Secondary | ICD-10-CM

## 2015-10-22 DIAGNOSIS — C50211 Malignant neoplasm of upper-inner quadrant of right female breast: Secondary | ICD-10-CM

## 2015-10-22 DIAGNOSIS — C50911 Malignant neoplasm of unspecified site of right female breast: Secondary | ICD-10-CM | POA: Diagnosis not present

## 2015-10-22 LAB — CBC WITH DIFFERENTIAL/PLATELET
BASO%: 0.2 % (ref 0.0–2.0)
Basophils Absolute: 0 10*3/uL (ref 0.0–0.1)
EOS%: 1.9 % (ref 0.0–7.0)
Eosinophils Absolute: 0.1 10*3/uL (ref 0.0–0.5)
HCT: 29.5 % — ABNORMAL LOW (ref 34.8–46.6)
HGB: 9.8 g/dL — ABNORMAL LOW (ref 11.6–15.9)
LYMPH#: 1.2 10*3/uL (ref 0.9–3.3)
LYMPH%: 20.2 % (ref 14.0–49.7)
MCH: 28.6 pg (ref 25.1–34.0)
MCHC: 33.2 g/dL (ref 31.5–36.0)
MCV: 86 fL (ref 79.5–101.0)
MONO#: 0.6 10*3/uL (ref 0.1–0.9)
MONO%: 9.9 % (ref 0.0–14.0)
NEUT#: 3.9 10*3/uL (ref 1.5–6.5)
NEUT%: 67.8 % (ref 38.4–76.8)
PLATELETS: 219 10*3/uL (ref 145–400)
RBC: 3.43 10*6/uL — AB (ref 3.70–5.45)
RDW: 16.3 % — ABNORMAL HIGH (ref 11.2–14.5)
WBC: 5.8 10*3/uL (ref 3.9–10.3)

## 2015-10-22 LAB — COMPREHENSIVE METABOLIC PANEL
ALBUMIN: 4 g/dL (ref 3.5–5.0)
ALK PHOS: 69 U/L (ref 40–150)
ALT: 13 U/L (ref 0–55)
ANION GAP: 10 meq/L (ref 3–11)
AST: 16 U/L (ref 5–34)
BILIRUBIN TOTAL: 0.42 mg/dL (ref 0.20–1.20)
BUN: 18 mg/dL (ref 7.0–26.0)
CALCIUM: 10 mg/dL (ref 8.4–10.4)
CHLORIDE: 105 meq/L (ref 98–109)
CO2: 25 mEq/L (ref 22–29)
CREATININE: 0.8 mg/dL (ref 0.6–1.1)
EGFR: 67 mL/min/{1.73_m2} — ABNORMAL LOW (ref >90)
Glucose: 128 mg/dl (ref 70–140)
Potassium: 4 mEq/L (ref 3.5–5.1)
Sodium: 140 mEq/L (ref 136–145)
Total Protein: 7.1 g/dL (ref 6.4–8.3)

## 2015-10-22 LAB — LACTATE DEHYDROGENASE: LDH: 177 U/L (ref 125–245)

## 2015-10-22 MED ORDER — IOPAMIDOL (ISOVUE-300) INJECTION 61%
100.0000 mL | Freq: Once | INTRAVENOUS | Status: AC | PRN
  Administered 2015-10-22: 100 mL via INTRAVENOUS

## 2015-10-22 MED ORDER — SODIUM CHLORIDE 0.9 % IJ SOLN
10.0000 mL | INTRAMUSCULAR | Status: DC | PRN
  Administered 2015-10-22: 10 mL via INTRAVENOUS
  Filled 2015-10-22: qty 10

## 2015-10-22 NOTE — Patient Instructions (Signed)

## 2015-10-23 ENCOUNTER — Telehealth: Payer: Self-pay | Admitting: Hematology and Oncology

## 2015-10-23 ENCOUNTER — Ambulatory Visit (HOSPITAL_BASED_OUTPATIENT_CLINIC_OR_DEPARTMENT_OTHER): Payer: Medicare Other

## 2015-10-23 ENCOUNTER — Encounter: Payer: Self-pay | Admitting: Hematology and Oncology

## 2015-10-23 ENCOUNTER — Ambulatory Visit (HOSPITAL_BASED_OUTPATIENT_CLINIC_OR_DEPARTMENT_OTHER): Payer: Medicare Other | Admitting: Hematology and Oncology

## 2015-10-23 VITALS — BP 99/82 | HR 80 | Temp 98.1°F | Resp 18

## 2015-10-23 DIAGNOSIS — D801 Nonfamilial hypogammaglobulinemia: Secondary | ICD-10-CM

## 2015-10-23 DIAGNOSIS — C8591 Non-Hodgkin lymphoma, unspecified, lymph nodes of head, face, and neck: Secondary | ICD-10-CM | POA: Diagnosis not present

## 2015-10-23 DIAGNOSIS — Z95828 Presence of other vascular implants and grafts: Secondary | ICD-10-CM

## 2015-10-23 DIAGNOSIS — C50211 Malignant neoplasm of upper-inner quadrant of right female breast: Secondary | ICD-10-CM | POA: Diagnosis not present

## 2015-10-23 DIAGNOSIS — D638 Anemia in other chronic diseases classified elsewhere: Secondary | ICD-10-CM | POA: Diagnosis not present

## 2015-10-23 DIAGNOSIS — M8580 Other specified disorders of bone density and structure, unspecified site: Secondary | ICD-10-CM | POA: Diagnosis not present

## 2015-10-23 LAB — IGG, IGA, IGM
IgA, Qn, Serum: 75 mg/dL (ref 64–422)
IgG, Qn, Serum: 766 mg/dL (ref 700–1600)
IgM, Qn, Serum: 16 mg/dL — ABNORMAL LOW (ref 26–217)

## 2015-10-23 MED ORDER — SODIUM CHLORIDE 0.9 % IV SOLN
10.0000 mg | Freq: Once | INTRAVENOUS | Status: AC
  Administered 2015-10-23: 10 mg via INTRAVENOUS
  Filled 2015-10-23: qty 1

## 2015-10-23 MED ORDER — DIPHENHYDRAMINE HCL 25 MG PO CAPS
25.0000 mg | ORAL_CAPSULE | Freq: Once | ORAL | Status: AC
  Administered 2015-10-23: 25 mg via ORAL

## 2015-10-23 MED ORDER — DIPHENHYDRAMINE HCL 25 MG PO CAPS
ORAL_CAPSULE | ORAL | Status: AC
  Filled 2015-10-23: qty 1

## 2015-10-23 MED ORDER — ALTEPLASE 2 MG IJ SOLR
2.0000 mg | Freq: Once | INTRAMUSCULAR | Status: DC | PRN
  Filled 2015-10-23: qty 2

## 2015-10-23 MED ORDER — HEPARIN SOD (PORK) LOCK FLUSH 100 UNIT/ML IV SOLN
500.0000 [IU] | Freq: Once | INTRAVENOUS | Status: AC | PRN
  Administered 2015-10-23: 500 [IU] via INTRAVENOUS
  Filled 2015-10-23: qty 5

## 2015-10-23 MED ORDER — IMMUNE GLOBULIN (HUMAN) 10 GM/100ML IV SOLN
400.0000 mg/kg | Freq: Once | INTRAVENOUS | Status: AC
  Administered 2015-10-23: 25 g via INTRAVENOUS
  Filled 2015-10-23: qty 200

## 2015-10-23 MED ORDER — SODIUM CHLORIDE 0.9% FLUSH
10.0000 mL | INTRAVENOUS | Status: DC | PRN
  Administered 2015-10-23: 10 mL via INTRAVENOUS
  Filled 2015-10-23: qty 10

## 2015-10-23 MED ORDER — SODIUM CHLORIDE 0.9 % IV SOLN
Freq: Once | INTRAVENOUS | Status: AC
  Administered 2015-10-23: 11:00:00 via INTRAVENOUS

## 2015-10-23 NOTE — Progress Notes (Signed)
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      01/03/2014 Bone Marrow Biopsy    Accession: [id number] Bone marrow biopsy is negative      01/04/2014 Imaging    ECHO showed normal EF      01/04/2014 Procedure    She has placement of port      01/09/2014 - 03/07/2014 Chemotherapy    She was given treatment with bendamustine with rituximab. Treatment was stopped due to severe side-effects despite significant dose adjustment for cycle 2      01/18/2014 - 02/01/2014 Hospital Admission    She was admitted to the hospital from Escherichia coli sepsis with multiorgan failure and brief episodes of intubation. She was discharged to skilled nursing facility      02/26/2014 Imaging    PET/CT scan showed near complete  remission      02/27/2014 Adverse Reaction    Cycle 2 of treatment was resumed with drastic dose adjustment to bendamustine due to recent multi-organ failure      04/03/2014 - 03/13/2015 Chemotherapy    She is started on maintenance rituximab only.      04/09/2014 - 04/12/2014 Hospital Admission    The patient was admitted to the hospital with urinary tract infection and sepsis.      06/05/2014 Imaging    PET CT scan showed complete response to Rx      02/12/2015 Imaging    Ct scan showed no evidence of disease. It shows she has new pneumonia      04/02/2015 Miscellaneous    She received 1 dose IVIG complicated by mild infusion reaction      04/26/2015 - 04/30/2015 Hospital Admission    She had recurrent admission to the hospital with sepsis      10/22/2015 Imaging    CT scan of chest and abdomen showed Ill-defined tissue in the porta hepatis suggest treated lymphoma. No evidence of lymphadenopathy in the chest, abdomen & pelvis to suggest recurrent lymphoma.       Breast cancer of upper-inner quadrant of right female breast ([place])   11/06/2014 Imaging    DEXA scan showed osteopenia T-1.9 on bilateral femoral neck      03/31/2015 Imaging    Screening mammogram showed suspicious lesion, confirmed on  diagnostic imaging at 2 and 230 position on the right breast      04/10/2015 Pathology Results    Accession: [id number] breast biopsy in 2 locations came back invasive ductal carcinoma with calcification, 100% ER and PR positive, HER 2 neg      04/10/2015 Clinical Stage    Stage IA: T1 N0      05/27/2015 Surgery    Right total mastectomy: multifocal IDC, 1.5 and 1.0 cm, neg margins, ER+ (100% - both); PR+ (100% and 95%), HER2neu negative (ratio 1.69 and 1.14) Ki67 2% and 10%. DCIS      05/27/2015 Pathologic Stage    Stage IA: mpT1c pNx pMx      06/17/2015 Survivorship    Survivorship care plan mailed to patient at her request      07/14/2015 -  Anti-estrogen oral therapy    She  started taking Arimidex       INTERVAL HISTORY: Please see below for problem oriented charting. She returns today to review test results. She denies recent infection. Her chest wall is healing well. The patient denies any recent signs or symptoms of bleeding such as spontaneous epistaxis, hematuria or hematochezia. She denies any recent abnormal breast examination, palpable mass, abnormal breast appearance or nipple changes   REVIEW OF SYSTEMS:   Constitutional: Denies fevers, chills or abnormal weight loss Eyes: Denies blurriness of vision Ears, nose, mouth, throat, and face: Denies mucositis or sore throat Respiratory: Denies cough, dyspnea or wheezes Cardiovascular: Denies palpitation, chest discomfort or lower extremity swelling Gastrointestinal:  Denies nausea, heartburn or change in bowel habits Skin: Denies abnormal skin rashes Lymphatics: Denies new lymphadenopathy or easy bruising Neurological:Denies numbness, tingling or new weaknesses Behavioral/Psych: Mood is stable, no new changes  All other systems were reviewed with the patient and are negative.  I have reviewed the past medical history, past surgical history, social history and family history with the patient and they are unchanged from previous note.  ALLERGIES:  is allergic to morphine and related; multaq [dronedarone]; demerol; oysters [shellfish allergy]; penicillins; and sulfa drugs cross reactors.  MEDICATIONS:  Current Outpatient Prescriptions  Medication Sig Dispense Refill  . acetaminophen (TYLENOL) 500 MG tablet Take 1,000 mg by mouth every 6 (six) hours as needed for moderate pain, fever or headache.    . albuterol (PROVENTIL) (2.5 MG/3ML) 0.083% nebulizer solution Inhale 3 mLs into the lungs every 6 (six) hours as needed for wheezing or shortness of breath.     . anastrozole (ARIMIDEX) 1 MG tablet Take 1 tablet (1 mg total) by mouth daily. 90 tablet 3  . cephALEXin (KEFLEX) 250 MG capsule Take 250 mg  by mouth at bedtime.     . Cholecalciferol (VITAMIN D) 2000 UNITS tablet Take 2,000 Units by mouth daily with lunch.    . diltiazem (CARDIZEM CD) 120 MG 24 hr capsule Take 1 capsule (120 mg total) by mouth daily. 30 capsule 6  . diphenoxylate-atropine (LOMOTIL) 2.5-0.025 MG per tablet Take 1 tablet by mouth 4 (four) times daily as needed for diarrhea or loose stools. 90 tablet 0  . Fluticasone-Salmeterol (ADVAIR) 250-50 MCG/DOSE AEPB Inhale 2 puffs into the lungs every 12 (twelve) hours.     Marland Kitchen levothyroxine (SYNTHROID, LEVOTHROID) 200 MCG tablet Take 200 mcg by mouth daily before breakfast.    . lidocaine-prilocaine (EMLA) cream Apply 1 application topically as needed. Apply to [facility] a Cath site at least one hour prior to needle stick as needed. 30 g 10  . Magnesium Oxide  500 MG TABS Take 500 mg by mouth daily.     . metFORMIN (GLUCOPHAGE-XR) 500 MG 24 hr tablet Take 500 mg by mouth 2 (two) times daily with a meal.    . montelukast (SINGULAIR) 10 MG tablet Take 10 mg by mouth daily.    . ondansetron (ZOFRAN) 8 MG tablet Take 8 mg by mouth every 8 (eight) hours as needed for nausea or vomiting.    . pravastatin (PRAVACHOL) 40 MG tablet Take 20 mg by mouth See admin instructions. Take 1/2 tablet (20 mg) ONLY on Monday, Tuesday, Wednesday, and Thursday Nights.    . prochlorperazine (COMPAZINE) 10 MG tablet Take 1 tablet (10 mg total) by mouth every 6 (six) hours as needed for nausea (nausea). 30 tablet 6  . rivaroxaban (XARELTO) 20 MG TABS tablet Take 20 mg by mouth daily with supper.    . tamsulosin (FLOMAX) 0.4 MG CAPS capsule Take 0.4 mg by mouth at bedtime.    . traMADol-acetaminophen (ULTRACET) 37.5-325 MG per tablet Take 1 tablet by mouth every 6 (six) hours as needed. (Patient taking differently: Take 1 tablet by mouth every 6 (six) hours as needed for moderate pain. ) 30 tablet 0  . vitamin B-12 (CYANOCOBALAMIN) 500 MCG tablet Take 500 mcg by mouth daily.     No current facility-administered  medications for this visit.    Facility-Administered Medications Ordered in Other Visits  Medication Dose Route Frequency Provider Last Rate Last Dose  . alteplase (CATHFLO ACTIVASE) injection 2 mg  2 mg Intracatheter Once PRN Heath Lark, MD      . heparin lock flush 100 unit/mL  500 Units Intravenous Once PRN Heath Lark, MD      . sodium chloride flush (NS) 0.9 % injection 10 mL  10 mL Intravenous PRN Heath Lark, MD        PHYSICAL EXAMINATION: ECOG PERFORMANCE STATUS: 1 - Symptomatic but completely ambulatory  Vitals:   10/23/15 0945  BP: (!) 130/59  Pulse: 98  Resp: 18  Temp: 98 F (36.7 C)   Filed Weights   10/23/15 0945  Weight: 147 lb (66.7 kg)    GENERAL:alert, no distress and comfortable SKIN: skin color, texture, turgor are normal, no rashes or significant lesions EYES: normal, Conjunctiva are pink and non-injected, sclera clear OROPHARYNX:no exudate, no erythema and lips, buccal mucosa, and tongue normal  NECK: supple, thyroid normal size, non-tender, without nodularity LYMPH:  no palpable lymphadenopathy in the cervical, axillary or inguinal LUNGS: clear to auscultation and percussion with normal breathing effort HEART: regular rate & rhythm and no murmurs and no lower extremity edema ABDOMEN:abdomen soft, non-tender and normal bowel sounds Musculoskeletal:no cyanosis of digits and no clubbing  NEURO: alert & oriented x 3 with fluent speech, no focal motor/sensory deficits Chest wall examination was performed. Well-healed mastectomy scar on the right breast without any other abnormalities. Normal breast exam on the left  LABORATORY DATA:  I have reviewed the data as listed    Component Value Date/Time   NA 140 10/22/2015 1220   K 4.0 10/22/2015 1220   CL 108 08/20/2015 0430   CO2 25 10/22/2015 1220   GLUCOSE 128 10/22/2015 1220   BUN 18.0 10/22/2015 1220   CREATININE 0.8 10/22/2015 1220   CALCIUM 10.0 10/22/2015 1220   PROT 7.1 10/22/2015 1220   ALBUMIN  4.0 10/22/2015 1220   AST 16 10/22/2015 1220   ALT 13 10/22/2015 1220   ALKPHOS 69 10/22/2015 1220   BILITOT 0.42 10/22/2015 1220  GFRNONAA >60 08/20/2015 0430   GFRAA >60 08/20/2015 0430    No results found for: SPEP, UPEP  Lab Results  Component Value Date   WBC 5.8 10/22/2015   NEUTROABS 3.9 10/22/2015   HGB 9.8 (L) 10/22/2015   HCT 29.5 (L) 10/22/2015   MCV 86.0 10/22/2015   PLT 219 10/22/2015      Chemistry      Component Value Date/Time   NA 140 10/22/2015 1220   K 4.0 10/22/2015 1220   CL 108 08/20/2015 0430   CO2 25 10/22/2015 1220   BUN 18.0 10/22/2015 1220   CREATININE 0.8 10/22/2015 1220      Component Value Date/Time   CALCIUM 10.0 10/22/2015 1220   ALKPHOS 69 10/22/2015 1220   AST 16 10/22/2015 1220   ALT 13 10/22/2015 1220   BILITOT 0.42 10/22/2015 1220       RADIOGRAPHIC STUDIES: I have personally reviewed the radiological images as listed and agreed with the findings in the report. Ct Chest W Contrast  Result Date: 10/22/2015 CLINICAL DATA:  Follicular lymphoma. Breast cancer staging. RIGHT breast cancer diagnosed January 2017. Lymphoma diagnosed 2015. Ongoing chemotherapy EXAM: CT CHEST, ABDOMEN, AND PELVIS WITH CONTRAST TECHNIQUE: Multidetector CT imaging of the chest, abdomen and pelvis was performed following the standard protocol during bolus administration of intravenous contrast. CONTRAST:  126m ISOVUE-300 IOPAMIDOL (ISOVUE-300) INJECTION 61% COMPARISON:  CT 02/12/2015, PET-CT 06/05/2014 FINDINGS: CT CHEST FINDINGS Cardiovascular: Abdominal aorta is normal caliber with atherosclerotic calcification. There is no retroperitoneal or periportal lymphadenopathy. No pelvic lymphadenopathy. Mediastinum/Nodes: No axillary supraclavicular adenopathy. No mediastinal hilar adenopathy. Lungs/Pleura: No suspicious pulmonary nodules Musculoskeletal: No aggressive osseous lesion. CT ABDOMEN AND PELVIS FINDINGS Hepatobiliary: No focal hepatic lesion. Post  cholecystectomy. Mild periportal edema. There is ill-defined tissue in the porta hepatis similar to comparison exam (image 59, series 2. Pancreas: Pancreas is normal. No ductal dilatation. No pancreatic inflammation. Spleen: Normal spleen Adrenals/urinary tract: Adrenal glands and kidneys are normal. The ureters and bladder normal. Stomach/Bowel: Stomach, small bowel, appendix, and cecum are normal. The colon and rectosigmoid colon are normal. Vascular/Lymphatic: Abdominal aorta is normal caliber with intimal calcification. Ill-defined tissue in the porta hepatis again noted.). Thickening along the LEFT and RIGHT obturator space to 10 mm mm (image 44, series 2) is similar prior). Thickening not not hypermetabolic on comparison PET CT scan. Reproductive: Post hysterectomy Other: No free fluid. Musculoskeletal: No aggressive osseous lesion. IMPRESSION: Chest Impression: 1. No evidence of breast cancer recurrence. 2. No evidence of lymphoma. Abdomen / Pelvis Impression: 1. Ill-defined tissue in the porta hepatis suggest treated lymphoma. 2. No evidence of lymphadenopathy in the abdomen pelvis to suggest recurrent lymphoma. Electronically Signed   By: SSuzy BouchardM.D.   On: 10/22/2015 15:42   Ct Abdomen Pelvis W Contrast  Result Date: 10/22/2015 CLINICAL DATA:  Follicular lymphoma. Breast cancer staging. RIGHT breast cancer diagnosed January 2017. Lymphoma diagnosed 2015. Ongoing chemotherapy EXAM: CT CHEST, ABDOMEN, AND PELVIS WITH CONTRAST TECHNIQUE: Multidetector CT imaging of the chest, abdomen and pelvis was performed following the standard protocol during bolus administration of intravenous contrast. CONTRAST:  1025mISOVUE-300 IOPAMIDOL (ISOVUE-300) INJECTION 61% COMPARISON:  CT 02/12/2015, PET-CT 06/05/2014 FINDINGS: CT CHEST FINDINGS Cardiovascular: Abdominal aorta is normal caliber with atherosclerotic calcification. There is no retroperitoneal or periportal lymphadenopathy. No pelvic lymphadenopathy.  Mediastinum/Nodes: No axillary supraclavicular adenopathy. No mediastinal hilar adenopathy. Lungs/Pleura: No suspicious pulmonary nodules Musculoskeletal: No aggressive osseous lesion. CT ABDOMEN AND PELVIS FINDINGS Hepatobiliary: No focal hepatic lesion. Post cholecystectomy.  Mild periportal edema. There is ill-defined tissue in the porta hepatis similar to comparison exam (image 59, series 2. Pancreas: Pancreas is normal. No ductal dilatation. No pancreatic inflammation. Spleen: Normal spleen Adrenals/urinary tract: Adrenal glands and kidneys are normal. The ureters and bladder normal. Stomach/Bowel: Stomach, small bowel, appendix, and cecum are normal. The colon and rectosigmoid colon are normal. Vascular/Lymphatic: Abdominal aorta is normal caliber with intimal calcification. Ill-defined tissue in the porta hepatis again noted.). Thickening along the LEFT and RIGHT obturator space to 10 mm mm (image 44, series 2) is similar prior). Thickening not not hypermetabolic on comparison PET CT scan. Reproductive: Post hysterectomy Other: No free fluid. Musculoskeletal: No aggressive osseous lesion. IMPRESSION: Chest Impression: 1. No evidence of breast cancer recurrence. 2. No evidence of lymphoma. Abdomen / Pelvis Impression: 1. Ill-defined tissue in the porta hepatis suggest treated lymphoma. 2. No evidence of lymphadenopathy in the abdomen pelvis to suggest recurrent lymphoma. Electronically Signed   By: Suzy Bouchard M.D.   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 She has excellent bone density in her spine but mild osteopenia in her femur. She is taking high-dose vitamin D and  calcium. I'm comfortable recommending her to start Arimidex on 07/14/2015 as part of the adjuvant treatment for breast cancer. So far, she tolerated treatment well without any side effects. I will continue to see her on the regular basis including breast examination and will make sure that she get regular screening mammogram  Anemia in chronic illness This is likely due to recent treatment and anemia of chronic disease. The patient denies recent history of bleeding such as epistaxis, hematuria or hematochezia. She is asymptomatic from the anemia. I will observe for now.     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 She also had mild infusion reaction with prior IVIG. I plan to reduce the dose to 400 mg/kg 1 dose monthly along with premedication. She will get it today She is currently on prophylactic therapy with Keflex to reduce recurrent sepsis She is currently undergoing urodynamic study to see if bladder procedure may reduce her risk of recurrent UTI I recommend IVIG monthly and I'll see her back next month   No orders of the defined types were placed in this encounter.  All questions were answered. The patient knows to call the clinic with any problems, questions or concerns. No barriers to learning was detected. I spent 25 minutes counseling the patient face to face. The total time spent in the appointment was 40 minutes and more than 50% was on counseling and review of test results     [facility], [place], MD 10/23/2015 1:26 PM

## 2015-10-23 NOTE — Assessment & Plan Note (Signed)
Her recent CT scan from December 2016 show no evidence of recurrence of lymphoma Repeat imaging study August 2017 showed no evidence of recurrence

## 2015-10-23 NOTE — Patient Instructions (Signed)

## 2015-10-23 NOTE — Telephone Encounter (Signed)
Gave patient spouse avs report and appointments for September.

## 2015-10-23 NOTE — Assessment & Plan Note (Signed)
This is due to rituximab. She also had mild infusion reaction with prior IVIG. I plan to reduce the dose to 400 mg/kg 1 dose monthly along with premedication. She will get it today She is currently on prophylactic therapy with Keflex to reduce recurrent sepsis She is currently undergoing urodynamic study to see if bladder procedure may reduce her risk of recurrent UTI I recommend IVIG monthly and I'll see her back next month

## 2015-10-23 NOTE — Assessment & Plan Note (Signed)
I reviewed the most recent bone density scan. She has excellent bone density in her spine but mild osteopenia in her femur. She is taking high-dose vitamin D and calcium. I'm comfortable recommending her to start Arimidex on 07/14/2015 as part of the adjuvant treatment for breast cancer. So far, she tolerated treatment well without any side effects. I will continue to see her on the regular basis including breast examination and will make sure that she get regular screening mammogram

## 2015-10-23 NOTE — Assessment & Plan Note (Signed)
This is likely due to recent treatment and anemia of chronic disease. The patient denies recent history of bleeding such as epistaxis, hematuria or hematochezia. She is asymptomatic from the anemia. I will observe for now.  

## 2015-10-24 DIAGNOSIS — M545 Low back pain: Secondary | ICD-10-CM | POA: Diagnosis not present

## 2015-10-24 DIAGNOSIS — R262 Difficulty in walking, not elsewhere classified: Secondary | ICD-10-CM | POA: Diagnosis not present

## 2015-10-24 DIAGNOSIS — M6281 Muscle weakness (generalized): Secondary | ICD-10-CM | POA: Diagnosis not present

## 2015-10-27 DIAGNOSIS — R262 Difficulty in walking, not elsewhere classified: Secondary | ICD-10-CM | POA: Diagnosis not present

## 2015-10-27 DIAGNOSIS — M6281 Muscle weakness (generalized): Secondary | ICD-10-CM | POA: Diagnosis not present

## 2015-10-27 DIAGNOSIS — M545 Low back pain: Secondary | ICD-10-CM | POA: Diagnosis not present

## 2015-10-29 DIAGNOSIS — R262 Difficulty in walking, not elsewhere classified: Secondary | ICD-10-CM | POA: Diagnosis not present

## 2015-10-29 DIAGNOSIS — M545 Low back pain: Secondary | ICD-10-CM | POA: Diagnosis not present

## 2015-10-29 DIAGNOSIS — M6281 Muscle weakness (generalized): Secondary | ICD-10-CM | POA: Diagnosis not present

## 2015-10-30 DIAGNOSIS — R3914 Feeling of incomplete bladder emptying: Secondary | ICD-10-CM | POA: Diagnosis not present

## 2015-11-05 DIAGNOSIS — M545 Low back pain: Secondary | ICD-10-CM | POA: Diagnosis not present

## 2015-11-05 DIAGNOSIS — M6281 Muscle weakness (generalized): Secondary | ICD-10-CM | POA: Diagnosis not present

## 2015-11-05 DIAGNOSIS — R262 Difficulty in walking, not elsewhere classified: Secondary | ICD-10-CM | POA: Diagnosis not present

## 2015-11-07 DIAGNOSIS — M545 Low back pain: Secondary | ICD-10-CM | POA: Diagnosis not present

## 2015-11-07 DIAGNOSIS — R262 Difficulty in walking, not elsewhere classified: Secondary | ICD-10-CM | POA: Diagnosis not present

## 2015-11-07 DIAGNOSIS — M6281 Muscle weakness (generalized): Secondary | ICD-10-CM | POA: Diagnosis not present

## 2015-11-10 ENCOUNTER — Encounter: Payer: Self-pay | Admitting: *Deleted

## 2015-11-10 DIAGNOSIS — M6281 Muscle weakness (generalized): Secondary | ICD-10-CM | POA: Diagnosis not present

## 2015-11-10 DIAGNOSIS — R262 Difficulty in walking, not elsewhere classified: Secondary | ICD-10-CM | POA: Diagnosis not present

## 2015-11-10 DIAGNOSIS — M545 Low back pain: Secondary | ICD-10-CM | POA: Diagnosis not present

## 2015-11-12 DIAGNOSIS — N312 Flaccid neuropathic bladder, not elsewhere classified: Secondary | ICD-10-CM | POA: Diagnosis not present

## 2015-11-12 DIAGNOSIS — N3020 Other chronic cystitis without hematuria: Secondary | ICD-10-CM | POA: Diagnosis not present

## 2015-11-12 DIAGNOSIS — R3914 Feeling of incomplete bladder emptying: Secondary | ICD-10-CM | POA: Diagnosis not present

## 2015-11-13 ENCOUNTER — Other Ambulatory Visit (HOSPITAL_COMMUNITY): Payer: Self-pay | Admitting: Internal Medicine

## 2015-11-13 ENCOUNTER — Ambulatory Visit (HOSPITAL_COMMUNITY)
Admission: RE | Admit: 2015-11-13 | Discharge: 2015-11-13 | Disposition: A | Discharge status: Home / Self Care | Payer: Medicare Other | Source: Ambulatory Visit | Attending: Vascular Surgery | Admitting: Vascular Surgery

## 2015-11-13 DIAGNOSIS — M7122 Synovial cyst of popliteal space [Baker], left knee: Secondary | ICD-10-CM | POA: Insufficient documentation

## 2015-11-13 DIAGNOSIS — M6281 Muscle weakness (generalized): Secondary | ICD-10-CM | POA: Diagnosis not present

## 2015-11-13 DIAGNOSIS — R600 Localized edema: Secondary | ICD-10-CM | POA: Diagnosis not present

## 2015-11-13 DIAGNOSIS — R262 Difficulty in walking, not elsewhere classified: Secondary | ICD-10-CM | POA: Diagnosis not present

## 2015-11-13 DIAGNOSIS — M545 Low back pain: Secondary | ICD-10-CM | POA: Diagnosis not present

## 2015-11-20 ENCOUNTER — Ambulatory Visit (HOSPITAL_BASED_OUTPATIENT_CLINIC_OR_DEPARTMENT_OTHER): Payer: Medicare Other | Admitting: Hematology and Oncology

## 2015-11-20 ENCOUNTER — Telehealth: Payer: Self-pay | Admitting: Hematology and Oncology

## 2015-11-20 ENCOUNTER — Encounter: Payer: Self-pay | Admitting: Hematology and Oncology

## 2015-11-20 ENCOUNTER — Ambulatory Visit: Payer: Medicare Other

## 2015-11-20 ENCOUNTER — Other Ambulatory Visit (HOSPITAL_BASED_OUTPATIENT_CLINIC_OR_DEPARTMENT_OTHER): Payer: Medicare Other

## 2015-11-20 ENCOUNTER — Ambulatory Visit (HOSPITAL_BASED_OUTPATIENT_CLINIC_OR_DEPARTMENT_OTHER): Payer: Medicare Other

## 2015-11-20 ENCOUNTER — Other Ambulatory Visit: Payer: Self-pay | Admitting: Hematology and Oncology

## 2015-11-20 VITALS — BP 121/62 | HR 96 | Temp 98.0°F | Resp 16

## 2015-11-20 DIAGNOSIS — C8591 Non-Hodgkin lymphoma, unspecified, lymph nodes of head, face, and neck: Secondary | ICD-10-CM

## 2015-11-20 DIAGNOSIS — D801 Nonfamilial hypogammaglobulinemia: Secondary | ICD-10-CM

## 2015-11-20 DIAGNOSIS — D638 Anemia in other chronic diseases classified elsewhere: Secondary | ICD-10-CM

## 2015-11-20 DIAGNOSIS — C50211 Malignant neoplasm of upper-inner quadrant of right female breast: Secondary | ICD-10-CM

## 2015-11-20 DIAGNOSIS — Z23 Encounter for immunization: Secondary | ICD-10-CM

## 2015-11-20 LAB — CBC WITH DIFFERENTIAL/PLATELET
BASO%: 0.5 % (ref 0.0–2.0)
BASOS ABS: 0 10*3/uL (ref 0.0–0.1)
EOS ABS: 0.2 10*3/uL (ref 0.0–0.5)
EOS%: 2.3 % (ref 0.0–7.0)
HEMATOCRIT: 28.9 % — AB (ref 34.8–46.6)
HGB: 9.5 g/dL — ABNORMAL LOW (ref 11.6–15.9)
LYMPH%: 18.7 % (ref 14.0–49.7)
MCH: 28 pg (ref 25.1–34.0)
MCHC: 32.9 g/dL (ref 31.5–36.0)
MCV: 85.3 fL (ref 79.5–101.0)
MONO#: 0.7 10*3/uL (ref 0.1–0.9)
MONO%: 10.4 % (ref 0.0–14.0)
NEUT#: 4.4 10*3/uL (ref 1.5–6.5)
NEUT%: 68.1 % (ref 38.4–76.8)
PLATELETS: 276 10*3/uL (ref 145–400)
RBC: 3.39 10*6/uL — ABNORMAL LOW (ref 3.70–5.45)
RDW: 15.9 % — AB (ref 11.2–14.5)
WBC: 6.4 10*3/uL (ref 3.9–10.3)
lymph#: 1.2 10*3/uL (ref 0.9–3.3)

## 2015-11-20 MED ORDER — SODIUM CHLORIDE 0.9 % IV SOLN
10.0000 mg | Freq: Once | INTRAVENOUS | Status: AC
  Administered 2015-11-20: 10 mg via INTRAVENOUS
  Filled 2015-11-20: qty 1

## 2015-11-20 MED ORDER — INFLUENZA VAC SPLIT QUAD 0.5 ML IM SUSY
0.5000 mL | PREFILLED_SYRINGE | Freq: Once | INTRAMUSCULAR | Status: AC
  Administered 2015-11-20: 0.5 mL via INTRAMUSCULAR
  Filled 2015-11-20: qty 0.5

## 2015-11-20 MED ORDER — DIPHENHYDRAMINE HCL 25 MG PO CAPS
ORAL_CAPSULE | ORAL | Status: AC
  Filled 2015-11-20: qty 1

## 2015-11-20 MED ORDER — PROCHLORPERAZINE MALEATE 10 MG PO TABS: 10.0000 mg | ORAL_TABLET | Freq: Four times a day (QID) | ORAL | 6 refills | Status: DC | PRN

## 2015-11-20 MED ORDER — DIPHENHYDRAMINE HCL 25 MG PO CAPS
25.0000 mg | ORAL_CAPSULE | Freq: Once | ORAL | Status: AC
  Administered 2015-11-20: 25 mg via ORAL

## 2015-11-20 MED ORDER — SODIUM CHLORIDE 0.9 % IJ SOLN
10.0000 mL | INTRAMUSCULAR | Status: DC | PRN
Start: 2015-11-20 — End: 2015-11-20
  Administered 2015-11-20: 10 mL via INTRAVENOUS
  Filled 2015-11-20: qty 10

## 2015-11-20 MED ORDER — HEPARIN SOD (PORK) LOCK FLUSH 100 UNIT/ML IV SOLN
500.0000 [IU] | Freq: Once | INTRAVENOUS | Status: AC | PRN
  Administered 2015-11-20: 500 [IU] via INTRAVENOUS
  Filled 2015-11-20: qty 5

## 2015-11-20 MED ORDER — IMMUNE GLOBULIN (HUMAN) 10 GM/100ML IV SOLN
400.0000 mg/kg | Freq: Once | INTRAVENOUS | Status: AC
  Administered 2015-11-20: 25 g via INTRAVENOUS
  Filled 2015-11-20: qty 200

## 2015-11-20 NOTE — Assessment & Plan Note (Signed)
This is due to rituximab. She also had mild infusion reaction with prior IVIG. I plan to reduce the dose to 400 mg/kg 1 dose monthly along with premedication. She will get it today She is currently on prophylactic therapy with Keflex to reduce recurrent sepsis Recent urodynamic studies suggested incomplete bladder emptying. She is currently performing self-catheterization twice a day I recommend IVIG monthly and I'll see her back next month

## 2015-11-20 NOTE — Assessment & Plan Note (Signed)
Patty [name] is taking Arimidex since 07/14/2015 as part of the adjuvant treatment for breast cancer. So far, Patty [name] tolerated treatment well without any side effects. I will continue to see her on the regular basis including breast examination and will make sure that Patty [name] get regular screening mammogram, next due January

## 2015-11-20 NOTE — Assessment & Plan Note (Signed)
Her recent CT scan from December 2016 show no evidence of recurrence of lymphoma Repeat imaging study August 2017 showed no evidence of recurrence I will repeat imaging study in 1 yr

## 2015-11-20 NOTE — Patient Instructions (Signed)

## 2015-11-20 NOTE — Assessment & Plan Note (Signed)
This is likely due to recent treatment and anemia of chronic disease. The patient denies recent history of bleeding such as epistaxis, hematuria or hematochezia. She is asymptomatic from the anemia. I will observe for now.  

## 2015-11-20 NOTE — Progress Notes (Signed)
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      01/03/2014 Bone Marrow Biopsy    Accession: [id number] Bone marrow biopsy is negative      01/04/2014 Imaging    ECHO showed normal EF      01/04/2014 Procedure    She has placement of port      01/09/2014 - 03/07/2014 Chemotherapy    She was given treatment with bendamustine with rituximab. Treatment was stopped due to severe side-effects despite significant dose adjustment for cycle 2      01/18/2014 - 02/01/2014 Hospital Admission    She was admitted to the hospital from Escherichia coli sepsis with multiorgan failure and brief episodes of intubation. She was discharged to skilled nursing facility      02/26/2014 Imaging    PET/CT scan showed near complete  remission      02/27/2014 Adverse Reaction    Cycle 2 of treatment was resumed with drastic dose adjustment to bendamustine due to recent multi-organ failure      04/03/2014 - 03/13/2015 Chemotherapy    She is started on maintenance rituximab only.      04/09/2014 - 04/12/2014 Hospital Admission    The patient was admitted to the hospital with urinary tract infection and sepsis.      06/05/2014 Imaging    PET CT scan showed complete response to Rx      02/12/2015 Imaging    Ct scan showed no evidence of disease. It shows she has new pneumonia      04/02/2015 Miscellaneous    She received 1 dose IVIG complicated by mild infusion reaction      04/26/2015 - 04/30/2015 Hospital Admission    She had recurrent admission to the hospital with sepsis      10/22/2015 Imaging    CT scan of chest and abdomen showed Ill-defined tissue in the porta hepatis suggest treated lymphoma. No evidence of lymphadenopathy in the chest, abdomen & pelvis to suggest recurrent lymphoma.       Breast cancer of upper-inner quadrant of right female breast ([place])   11/06/2014 Imaging    DEXA scan showed osteopenia T-1.9 on bilateral femoral neck      03/31/2015 Imaging    Screening mammogram showed suspicious lesion, confirmed on  diagnostic imaging at 2 and 230 position on the right breast      04/10/2015 Pathology Results    Accession: [id number] breast biopsy in 2 locations came back invasive ductal carcinoma with calcification, 100% ER and PR positive, HER 2 neg      04/10/2015 Clinical Stage    Stage IA: T1 N0      05/27/2015 Surgery    Right total mastectomy: multifocal IDC, 1.5 and 1.0 cm, neg margins, ER+ (100% - both); PR+ (100% and 95%), HER2neu negative (ratio 1.69 and 1.14) Ki67 2% and 10%. DCIS      05/27/2015 Pathologic Stage    Stage IA: mpT1c pNx pMx      06/17/2015 Survivorship    Survivorship care plan mailed to patient at her request      07/14/2015 -  Anti-estrogen oral therapy    She  started taking Arimidex       INTERVAL HISTORY: Please see below for problem oriented charting. She returns today to receive IVIG. She denies recent infection/sepsis. She is currently performing self-catheterization twice a day due to incomplete bladder emptying. No new lymphadenopathy. She denies any recent abnormal breast examination, palpable mass, abnormal breast appearance or nipple changes Denies side effects of Arimidex  REVIEW OF SYSTEMS:   Constitutional: Denies fevers, chills or abnormal weight loss Eyes: Denies blurriness of vision Ears, nose, mouth, throat, and face: Denies mucositis or sore throat Respiratory: Denies cough, dyspnea or wheezes Cardiovascular: Denies palpitation, chest discomfort or lower extremity swelling Gastrointestinal:  Denies nausea, heartburn or change in bowel habits Skin: Denies abnormal skin rashes Lymphatics: Denies new lymphadenopathy or easy bruising Neurological:Denies numbness, tingling or new weaknesses Behavioral/Psych: Mood is stable, no new changes  All other systems were reviewed with the patient and are negative.  I have reviewed the past medical history, past surgical history, social history and family history with the patient and they are unchanged from previous note.  ALLERGIES:  is allergic to morphine and related; multaq [dronedarone]; demerol; oysters [shellfish allergy]; penicillins; and sulfa drugs cross reactors.  MEDICATIONS:  Current Outpatient Prescriptions  Medication Sig Dispense Refill  . acetaminophen (TYLENOL) 500 MG tablet Take 1,000 mg by mouth every 6 (six) hours as needed for moderate pain, fever or headache.    . albuterol (PROVENTIL) (2.5 MG/3ML) 0.083% nebulizer solution Inhale 3 mLs into the lungs every 6 (six) hours as needed for wheezing or shortness of breath.     . anastrozole (ARIMIDEX) 1 MG tablet Take 1 tablet (1 mg total) by mouth daily. 90 tablet 3  . cephALEXin (KEFLEX) 250 MG capsule Take 250 mg  by mouth at bedtime.     . Cholecalciferol (VITAMIN D) 2000 UNITS tablet Take 2,000 Units by mouth daily with lunch.    . diltiazem (CARDIZEM CD) 120 MG 24 hr capsule Take 1 capsule (120 mg total) by mouth daily. 30 capsule 6  . diphenoxylate-atropine (LOMOTIL) 2.5-0.025 MG per tablet Take 1 tablet by mouth 4 (four) times daily as needed for diarrhea or loose stools. 90 tablet 0  . doxycycline (VIBRA-TABS) 100 MG tablet Take 100 mg by mouth 2 (two) times daily.    . Fluticasone-Salmeterol (ADVAIR) 250-50 MCG/DOSE AEPB Inhale 2 puffs into the lungs every 12 (twelve) hours.     Marland Kitchen levothyroxine (SYNTHROID, LEVOTHROID) 200 MCG tablet Take 200 mcg by mouth daily before breakfast.    . lidocaine-prilocaine (EMLA) cream Apply 1 application topically as needed. Apply to [facility] a Cath site at least one  hour prior to needle stick as needed. 30 g 10  . Magnesium Oxide 500 MG TABS Take 500 mg by mouth daily.     . metFORMIN (GLUCOPHAGE-XR) 500 MG 24 hr tablet Take 500 mg by mouth 2 (two) times daily with a meal.    . montelukast (SINGULAIR) 10 MG tablet Take 10 mg by mouth daily.    . ondansetron (ZOFRAN) 8 MG tablet Take 8 mg by mouth every 8 (eight) hours as needed for nausea or vomiting.    . pravastatin (PRAVACHOL) 40 MG tablet Take 20 mg by mouth See admin instructions. Take 1/2 tablet (20 mg) ONLY on Monday, Tuesday, Wednesday, and Thursday Nights.    . prochlorperazine (COMPAZINE) 10 MG tablet Take 1 tablet (10 mg total) by mouth every 6 (six) hours as needed for nausea (nausea). 30 tablet 6  . rivaroxaban (XARELTO) 20 MG TABS tablet Take 20 mg by mouth daily with supper.    . traMADol-acetaminophen (ULTRACET) 37.5-325 MG per tablet Take 1 tablet by mouth every 6 (six) hours as needed. (Patient taking differently: Take 1 tablet by mouth every 6 (six) hours as needed for moderate pain. ) 30 tablet 0  . vitamin B-12 (CYANOCOBALAMIN) 500 MCG tablet Take 500 mcg by mouth daily.     No current  facility-administered medications for this visit.    Facility-Administered Medications Ordered in Other Visits  Medication Dose Route Frequency Provider Last Rate Last Dose  . heparin lock flush 100 unit/mL  500 Units Intravenous Once PRN Heath Lark, MD      . Immune Globulin 10% (PRIVIGEN) IV infusion 25 g  400 mg/kg Intravenous Once Heath Lark, MD      . sodium chloride 0.9 % injection 10 mL  10 mL Intravenous PRN Heath Lark, MD        PHYSICAL EXAMINATION: ECOG PERFORMANCE STATUS: 1 - Symptomatic but completely ambulatory  Vitals:   11/20/15 0941  BP: (!) 110/58  Pulse: 94  Resp: 18  Temp: 97.8 F (36.6 C)   Filed Weights   11/20/15 0941  Weight: 144 lb (65.3 kg)    GENERAL:alert, no distress and comfortable SKIN: skin color, texture, turgor are normal, no rashes or significant lesions EYES: normal, Conjunctiva are pink and non-injected, sclera clear OROPHARYNX:no exudate, no erythema and lips, buccal mucosa, and tongue normal  NECK: supple, thyroid normal size, non-tender, without nodularity LYMPH:  no palpable lymphadenopathy in the cervical, axillary or inguinal LUNGS: clear to auscultation and percussion with normal breathing effort HEART: regular rate & rhythm and no murmurs and no lower extremity edema ABDOMEN:abdomen soft, non-tender and normal bowel sounds Musculoskeletal:no cyanosis of digits and no clubbing  NEURO: alert & oriented x 3 with fluent speech, no focal motor/sensory deficits  LABORATORY DATA:  I have reviewed the data as listed    Component Value Date/Time   NA 140 10/22/2015 1220   K 4.0 10/22/2015 1220   CL 108 08/20/2015 0430   CO2 25 10/22/2015 1220   GLUCOSE 128 10/22/2015 1220   BUN 18.0 10/22/2015 1220   CREATININE 0.8 10/22/2015 1220   CALCIUM 10.0 10/22/2015 1220   PROT 7.1 10/22/2015 1220   ALBUMIN 4.0 10/22/2015 1220   AST 16 10/22/2015 1220   ALT 13 10/22/2015 1220   ALKPHOS 69 10/22/2015 1220   BILITOT 0.42 10/22/2015 1220    GFRNONAA >60 08/20/2015 0430   GFRAA >60 08/20/2015 0430    No results found for: SPEP, UPEP  Lab Results  Component  Value Date   WBC 6.4 11/20/2015   NEUTROABS 4.4 11/20/2015   HGB 9.5 (L) 11/20/2015   HCT 28.9 (L) 11/20/2015   MCV 85.3 11/20/2015   PLT 276 11/20/2015      Chemistry      Component Value Date/Time   NA 140 10/22/2015 1220   K 4.0 10/22/2015 1220   CL 108 08/20/2015 0430   CO2 25 10/22/2015 1220   BUN 18.0 10/22/2015 1220   CREATININE 0.8 10/22/2015 1220      Component Value Date/Time   CALCIUM 10.0 10/22/2015 1220   ALKPHOS 69 10/22/2015 1220   AST 16 10/22/2015 1220   ALT 13 10/22/2015 1220   BILITOT 0.42 10/22/2015 1220      ASSESSMENT & PLAN:  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 She also had mild infusion reaction with prior IVIG. I plan to reduce the dose to 400 mg/kg 1 dose monthly along with premedication. She will get it today She is currently on prophylactic therapy with Keflex to reduce recurrent sepsis Recent urodynamic studies suggested incomplete bladder emptying. 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 So far, she tolerated treatment well without any side effects. 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 The patient  denies recent history of bleeding such as epistaxis, hematuria or hematochezia. She is asymptomatic from the anemia. I will observe for now.    No orders of the defined types were placed in this encounter.  All questions were answered. The patient knows to call the clinic with any problems, questions or concerns. No barriers to learning was detected. I spent 25 minutes counseling the patient face to face. The total time spent in the appointment was 30 minutes and more than 50% was on counseling and review of test results     [facility], [place], MD 11/20/2015 11:20 AM

## 2015-11-20 NOTE — Telephone Encounter (Signed)
Gave patient avs report and appointments for October  °

## 2015-11-23 IMAGING — CT NM PET TUM IMG RESTAG (PS) SKULL BASE T - THIGH
7 series · 23 of 25 positions shown · non-contrast
Comparison: PET-CT 02/26/2014

CLINICAL DATA: Subsequent treatment strategy for lymphoma.
high-grade follicular lymphoma.

Malignant lymphomas of lymph nodes of head, face, and neck Staging
form: Lymphoid Neoplasms, AJCC 6th Edition Clinical: Stage III -
EXAM:
NUCLEAR MEDICINE PET SKULL BASE TO THIGH
TECHNIQUE: 6.7 mCi F-18 FDG was injected intravenously. Full-ring PET imaging
was performed from the skull base to thigh after the radiotracer. CT
data was obtained and used for attenuation correction and anatomic
localization.
FASTING BLOOD GLUCOSE:  Value: 79 mg/dl

[Series 3: pet sk_thigh ac · axial · 5.0mm · 4.07mm/px · z∈[-1328,-388]mm · 5 of 236 slices shown]
[im 1/236]
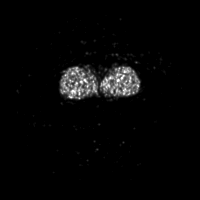
[im 59/236]
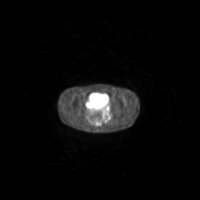
[im 118/236]
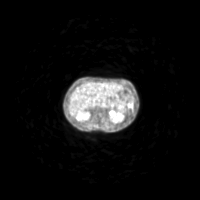
[im 177/236]
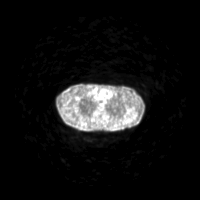
[im 236/236]
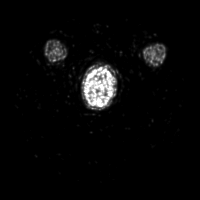

[Series 4: ct sk_thigh 5.0 b31f · axial · 5.0mm · 0.89mm/px · z∈[-1328,-388]mm · 4 of 235 slices shown]
[im 1/235]
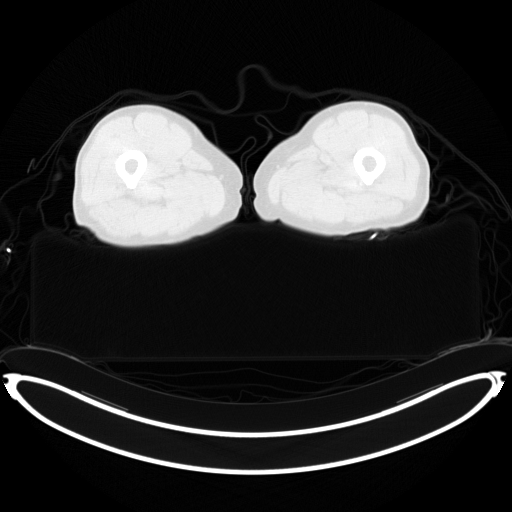
[im 118/235]
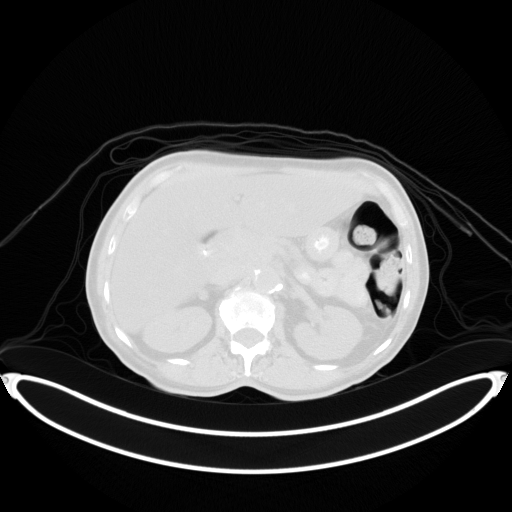
[im 176/235]
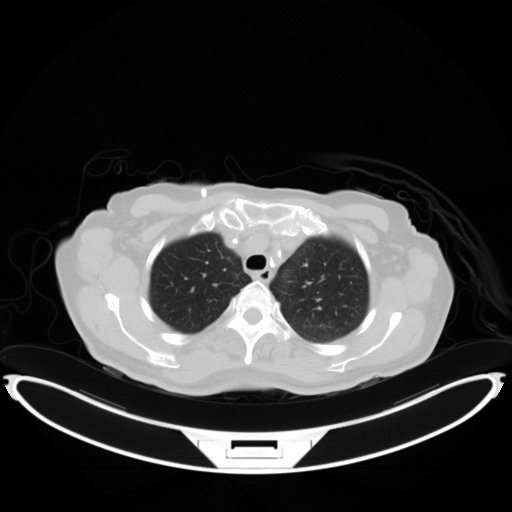
[im 235/235  brain]
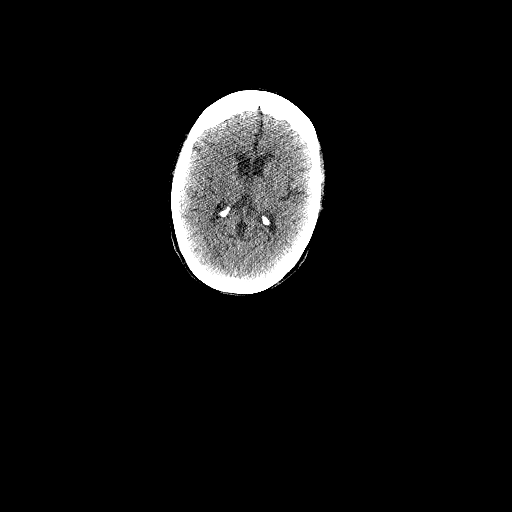

[Series 6: ct sk_thigh 5.0 b70f (id)_bone · axial · 5.0mm · 0.71mm/px · z∈[-830,-534]mm · 2 of 75 slices shown]
[im 1/75  bone]
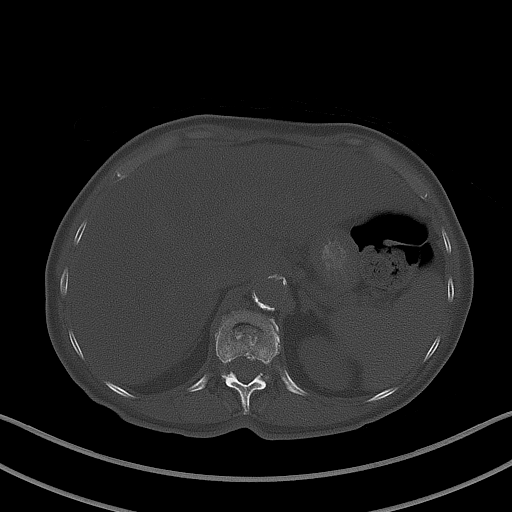
[im 75/75  bone]
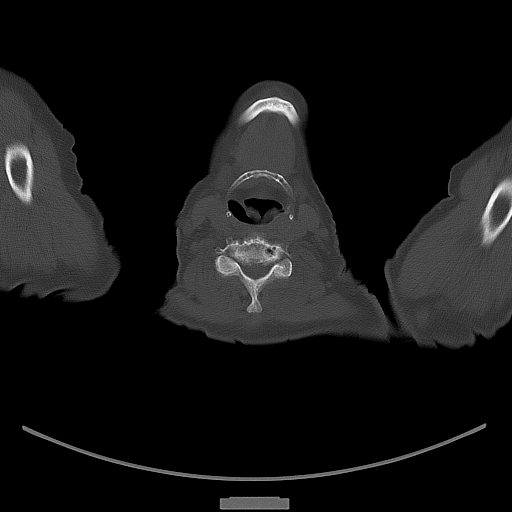

[Series 8: pet sk_thigh nac · axial · 5.0mm · 4.07mm/px · z∈[-1328,-388]mm · 5 of 236 slices shown]
[im 1/236]
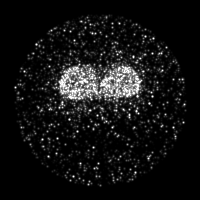
[im 59/236]
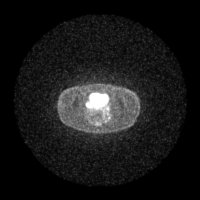
[im 118/236]
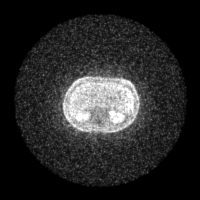
[im 177/236]
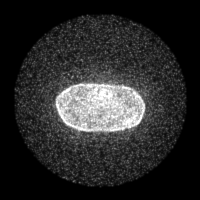
[im 236/236]
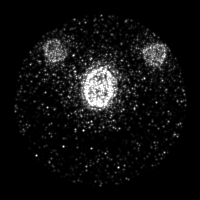

[Series 603: mip collection<mip range> · coronal · 1.95mm/px · 1 of 32 slices shown]
[im 1/32]
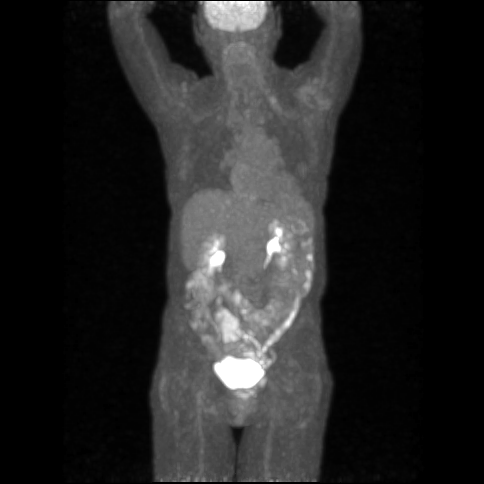

[Series 604: range-ct sk_thigh 5.0 (id)<mpr range> · coronal · 5.0mm · 0.92mm/px · 1 of 86 slices shown]
[im 86/86  full-range]
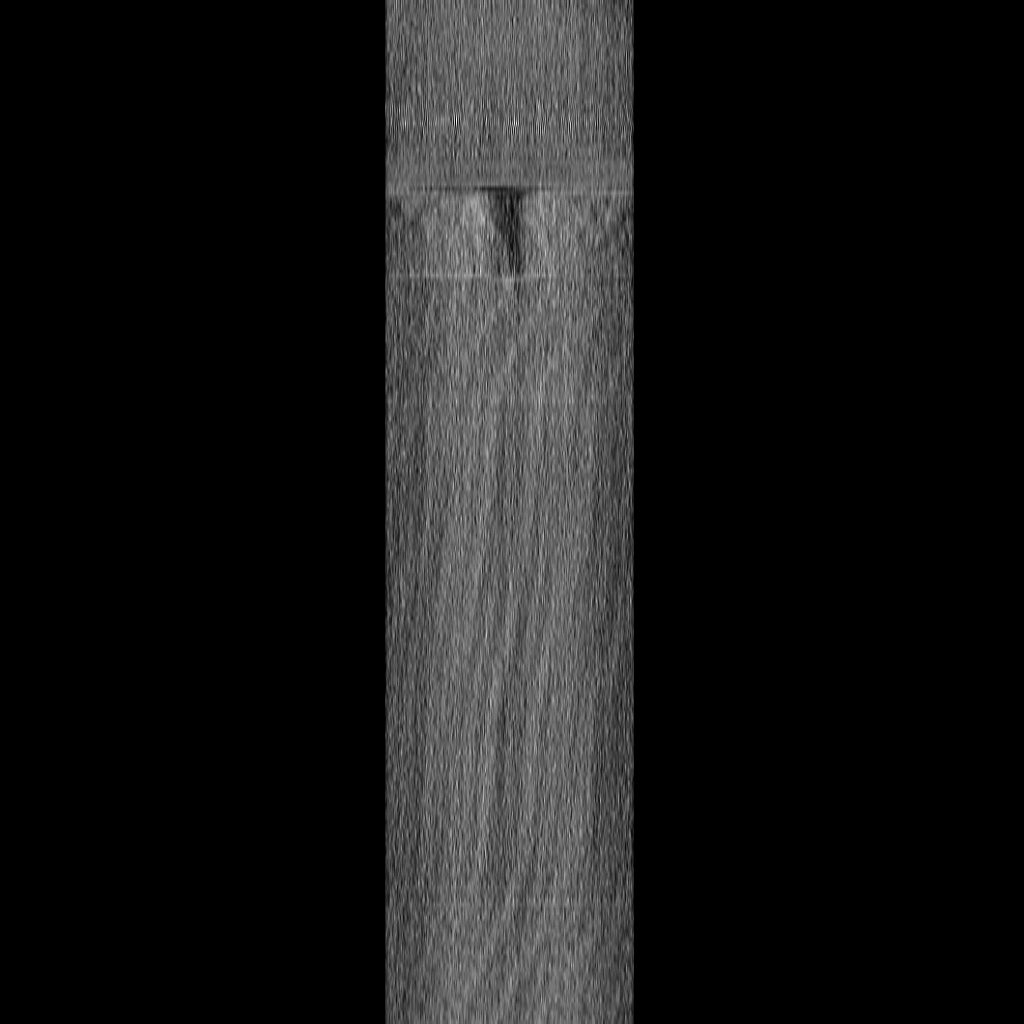

[Series 605: range-ct sk_thigh 5.0 (id)<alpha range> · 5 of 229 slices shown]
[im 1/229]
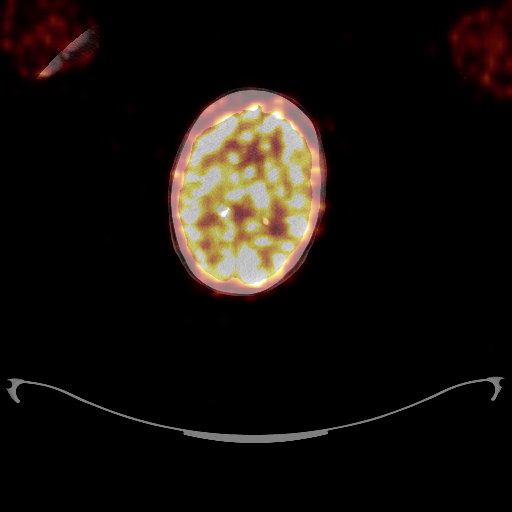
[im 58/229]
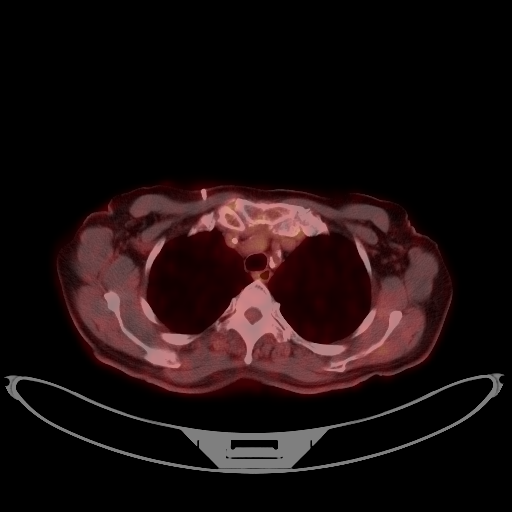
[im 115/229]
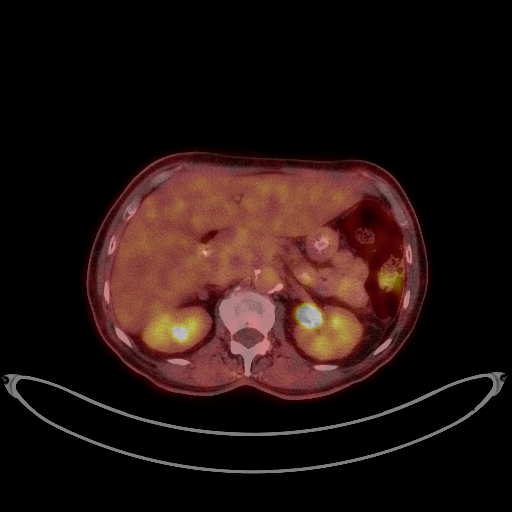
[im 172/229]
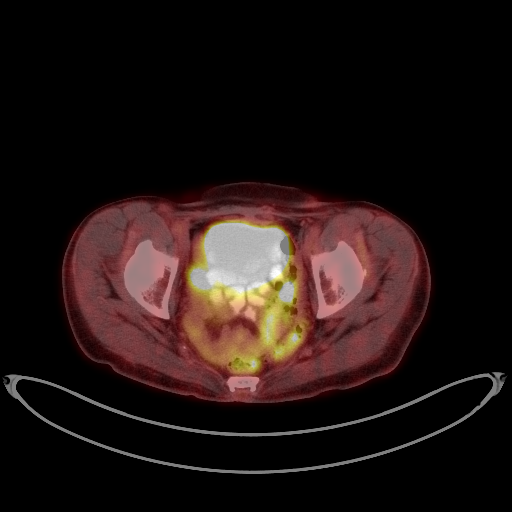
[im 229/229]
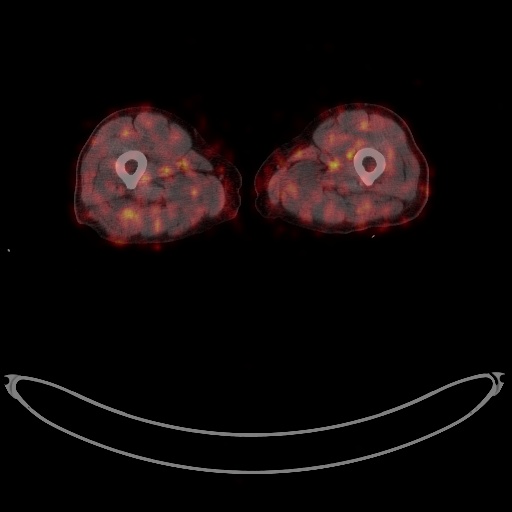

[23 of 25 positions shown; findings below may reference images not displayed]

FINDINGS: NECK

No hypermetabolic lymph nodes in the neck.

CHEST

No hypermetabolic mediastinal or hilar nodes. No suspicious
pulmonary nodules on the CT scan.

ABDOMEN/PELVIS

No abnormal hypermetabolic activity within the liver, pancreas,
adrenal glands, or spleen. Normal volume spleen. No hypermetabolic
lymph nodes in the abdomen or pelvis. Physiologic activity noted
throughout the colon.

Atherosclerotic calcification aorta. Diffuse low-attenuation the
liver.

SKELETON

No focal hypermetabolic activity to suggest skeletal metastasis.
IMPRESSION: 1. No evidence of active lymphoma on skullbase to thigh scan.
2. Normal spleen.
3. No abnormal marrow activity.
4. Atherosclerotic disease of the aorta.
5. Hepatic steatosis

## 2015-11-24 ENCOUNTER — Encounter: Payer: Self-pay | Admitting: Internal Medicine

## 2015-11-24 ENCOUNTER — Ambulatory Visit (INDEPENDENT_AMBULATORY_CARE_PROVIDER_SITE_OTHER): Payer: Medicare Other | Admitting: Internal Medicine

## 2015-11-24 VITALS — BP 140/52 | HR 95 | Ht 66.5 in | Wt 142.6 lb

## 2015-11-24 DIAGNOSIS — I481 Persistent atrial fibrillation: Secondary | ICD-10-CM

## 2015-11-24 DIAGNOSIS — I10 Essential (primary) hypertension: Secondary | ICD-10-CM | POA: Diagnosis not present

## 2015-11-24 DIAGNOSIS — I4819 Other persistent atrial fibrillation: Secondary | ICD-10-CM

## 2015-11-24 NOTE — Progress Notes (Signed)
PCP: Horatio Pel, MD Primary Cardiologist:  Dr Delanna Notice is a 75 y.o. female who presents today for routine electrophysiology followup.  Since her recent ablation, the patient reports doing very well. She feels that her AF is controlled.  She denies procedure related complications.  Today, she denies symptoms of palpitations, chest pain, shortness of breath,  lower extremity edema, dizziness, presyncope, or syncope.  The patient is otherwise without complaint today.   Past Medical History:  Diagnosis Date  . Arthritis   . Asthma   . Breast cancer, right ([place]) 2/17   R sided, s/p total mastectomy  . Chronic kidney disease    recurring UTI  . Complication of anesthesia    "morphine made me stop breathing"  . HCAP (healthcare-associated pneumonia) 12/2013   Archie Endo 01/18/2014  . History of skin cancer   . History of transfusion   . Hypertension   . Hypothyroidism   . Left atrial enlargement   . Lymphoma, non-Hodgkin's ([place])    in remission  . Nocturia   . Persistent atrial fibrillation ([place])   . Shortness of breath dyspnea    "at times"  . Skin cancer   . Sleep apnea    uses c-pap  . Type II diabetes mellitus ([place])    Past Surgical History:  Procedure Laterality Date  . ABDOMINAL HYSTERECTOMY  1972   partial  . APPENDECTOMY  1953  . ATRIAL FIBRILLATION ABLATION  08/19/2015  . BACK SURGERY     x2  . CARDIAC CATHETERIZATION  10/10/2008   Nonobstructive CAD  . CARDIOVERSION  04/13/2011   Procedure: CARDIOVERSION;  Surgeon: Laverda Page, MD;  Location: [facility];  Service: Cardiovascular;  Laterality: N/A;  . CARDIOVERSION N/A 06/18/2015   Procedure: CARDIOVERSION;  Surgeon: Adrian Prows, MD;  Location: [facility];  Service: Cardiovascular;  Laterality: N/A;  . CATARACT EXTRACTION W/ INTRAOCULAR LENS  IMPLANT, BILATERAL Bilateral   . CHOLECYSTECTOMY  1993  . ELECTROPHYSIOLOGIC STUDY N/A 08/19/2015   Procedure: Atrial Fibrillation Ablation;  Surgeon:  Thompson Grayer, MD;  Location: [facility];  Service: Cardiovascular;  Laterality: N/A;  . FOOT SURGERY    . LUMBAR LAMINECTOMY    . MASS BIOPSY Left 12/07/2013   Procedure: EXCISIONAL BIOPSY LEFT NECK MASS ;  Surgeon: Jerrell Belfast, MD;  Location: [facility];  Service: ENT;  Laterality: Left;  Marland Kitchen MASTECTOMY    . PORTACATH PLACEMENT  12/2013   Archie Endo 01/04/2014  . PUBOVAGINAL SLING    . SKIN TAG REMOVAL  2012   leg  . TEE WITHOUT CARDIOVERSION N/A 08/19/2015   Procedure: TRANSESOPHAGEAL ECHOCARDIOGRAM (TEE);  Surgeon: Larey Dresser, MD;  Location: [facility];  Service: Cardiovascular;  Laterality: N/A;  . TONSILLECTOMY    . TOTAL MASTECTOMY Right 05/27/2015   Procedure: RIGHT TOTAL MASTECTOMY;  Surgeon: Fanny Skates, MD;  Location: [facility];  Service: General;  Laterality: Right;    ROS- all systems are reviewed and negatives except as per HPI above  Current Outpatient Prescriptions  Medication Sig Dispense Refill  . acetaminophen (TYLENOL) 500 MG tablet Take 1,000 mg by mouth every 6 (six) hours as needed for moderate pain, fever or headache.    . albuterol (PROVENTIL HFA;VENTOLIN HFA) 108 (90 Base) MCG/ACT inhaler Inhale into the lungs as directed.    Marland Kitchen albuterol (PROVENTIL) (2.5 MG/3ML) 0.083% nebulizer solution Inhale 3 mLs into the lungs every 6 (six) hours as needed for wheezing or shortness of breath.     Marland Kitchen  anastrozole (ARIMIDEX) 1 MG tablet Take 1 tablet (1 mg total) by mouth daily. 90 tablet 3  . cephALEXin (KEFLEX) 250 MG capsule Take 250 mg by mouth at bedtime.     . Cholecalciferol (VITAMIN D) 2000 UNITS tablet Take 2,000 Units by mouth daily with lunch.    . diltiazem (CARDIZEM CD) 120 MG 24 hr capsule Take 1 capsule (120 mg total) by mouth daily. 30 capsule 6  . Fluticasone-Salmeterol (ADVAIR) 250-50 MCG/DOSE AEPB Inhale 2 puffs into the lungs every 12 (twelve) hours.     . hydrochlorothiazide (HYDRODIURIL) 25 MG tablet Take 37.5 mg by mouth daily as needed (swelling).      Marland Kitchen levothyroxine (SYNTHROID, LEVOTHROID) 200 MCG tablet Take 200 mcg by mouth daily before breakfast.    . lidocaine-prilocaine (EMLA) cream Apply 1 application topically as needed. Apply to [facility] a Cath site at least one hour prior to needle stick as needed. 30 g 10  . Magnesium Oxide 500 MG TABS Take 500 mg by mouth daily.     . metFORMIN (GLUCOPHAGE-XR) 500 MG 24 hr tablet Take 500 mg by mouth 2 (two) times daily with a meal.    . montelukast (SINGULAIR) 10 MG tablet Take 10 mg by mouth daily.    . Omega-3-acid Ethyl Esters (LOVAZA PO) Take 1 capsule by mouth 2 (two) times daily.    . pravastatin (PRAVACHOL) 40 MG tablet Take 20 mg by mouth See admin instructions. Take 1/2 tablet (20 mg) ONLY on Monday, Tuesday, Wednesday, and Thursday Nights.    . rivaroxaban (XARELTO) 20 MG TABS tablet Take 20 mg by mouth daily with supper.    . traMADol-acetaminophen (ULTRACET) 37.5-325 MG tablet Take 1 tablet by mouth every 6 (six) hours as needed (Pain).    . vitamin B-12 (CYANOCOBALAMIN) 500 MCG tablet Take 500 mcg by mouth daily.     No current facility-administered medications for this visit.     Physical Exam: Vitals:   11/24/15 1035  BP: (!) 140/52  Pulse: 95  Weight: 142 lb 9.6 oz (64.7 kg)  Height: 5' 6.5" (1.689 m)    GEN- The patient is well appearing, alert and oriented x 3 today.   Head- normocephalic, atraumatic Eyes-  Sclera clear, conjunctiva pink Ears- hearing intact Oropharynx- clear Lungs- Clear to ausculation bilaterally, normal work of breathing Heart- Regular rate and rhythm, no murmurs, rubs or gallops, PMI not laterally displaced GI- soft, NT, ND, + BS Extremities- no clubbing, cyanosis, or edema  ekg today reveals sinus rhythm 95 bpm, PR 248 msec, LAD  Assessment and Plan:  1. Afib/ atrial flutter Doing well s/p ablation off AAD therapy Continue long term anticoagulation with xarelto Continue on low dose diltiazem for now.  Given first degree AV block, would  consider stopping diltiazem in the future  Follow-up with Butch Penny in the AF clinic in 3 months I will see again in 6 months Follow-up with Dr Einar Gip as scheduled  Thompson Grayer MD, [facility] 11/24/2015 11:03 AM

## 2015-11-24 NOTE — Patient Instructions (Addendum)
Medication Instructions:  Your physician recommends that you continue on your current medications as directed. Please refer to the Current Medication list given to you today.   Labwork: None ordered   Testing/Procedures: None ordered   Follow-Up:  Your physician recommends that you schedule a follow-up appointment in: 3 months with Donna Carroll, NP and 6 months with Dr Allred   Any Other Special Instructions Will Be Listed Below (If Applicable).     If you need a refill on your cardiac medications before your next appointment, please call your pharmacy.   

## 2015-11-25 DIAGNOSIS — M1712 Unilateral primary osteoarthritis, left knee: Secondary | ICD-10-CM | POA: Diagnosis not present

## 2015-11-25 DIAGNOSIS — M11269 Other chondrocalcinosis, unspecified knee: Secondary | ICD-10-CM | POA: Diagnosis not present

## 2015-11-26 DIAGNOSIS — R262 Difficulty in walking, not elsewhere classified: Secondary | ICD-10-CM | POA: Diagnosis not present

## 2015-11-26 DIAGNOSIS — M6281 Muscle weakness (generalized): Secondary | ICD-10-CM | POA: Diagnosis not present

## 2015-11-26 DIAGNOSIS — M545 Low back pain: Secondary | ICD-10-CM | POA: Diagnosis not present

## 2015-12-01 DIAGNOSIS — M11262 Other chondrocalcinosis, left knee: Secondary | ICD-10-CM | POA: Diagnosis not present

## 2015-12-01 DIAGNOSIS — M1712 Unilateral primary osteoarthritis, left knee: Secondary | ICD-10-CM | POA: Diagnosis not present

## 2015-12-04 DIAGNOSIS — L814 Other melanin hyperpigmentation: Secondary | ICD-10-CM | POA: Diagnosis not present

## 2015-12-04 DIAGNOSIS — L821 Other seborrheic keratosis: Secondary | ICD-10-CM | POA: Diagnosis not present

## 2015-12-04 DIAGNOSIS — D225 Melanocytic nevi of trunk: Secondary | ICD-10-CM | POA: Diagnosis not present

## 2015-12-04 DIAGNOSIS — L820 Inflamed seborrheic keratosis: Secondary | ICD-10-CM | POA: Diagnosis not present

## 2015-12-11 DIAGNOSIS — E1142 Type 2 diabetes mellitus with diabetic polyneuropathy: Secondary | ICD-10-CM | POA: Diagnosis not present

## 2015-12-11 DIAGNOSIS — M545 Low back pain: Secondary | ICD-10-CM | POA: Diagnosis not present

## 2015-12-11 DIAGNOSIS — M25562 Pain in left knee: Secondary | ICD-10-CM | POA: Diagnosis not present

## 2015-12-11 DIAGNOSIS — M1288 Other specific arthropathies, not elsewhere classified, other specified site: Secondary | ICD-10-CM | POA: Diagnosis not present

## 2015-12-12 DIAGNOSIS — M25562 Pain in left knee: Secondary | ICD-10-CM | POA: Insufficient documentation

## 2015-12-15 DIAGNOSIS — M25562 Pain in left knee: Secondary | ICD-10-CM | POA: Diagnosis not present

## 2015-12-15 DIAGNOSIS — M109 Gout, unspecified: Secondary | ICD-10-CM | POA: Diagnosis not present

## 2015-12-15 DIAGNOSIS — M179 Osteoarthritis of knee, unspecified: Secondary | ICD-10-CM | POA: Diagnosis not present

## 2015-12-15 DIAGNOSIS — M25462 Effusion, left knee: Secondary | ICD-10-CM | POA: Diagnosis not present

## 2015-12-18 ENCOUNTER — Ambulatory Visit (HOSPITAL_BASED_OUTPATIENT_CLINIC_OR_DEPARTMENT_OTHER): Payer: Medicare Other | Admitting: Hematology and Oncology

## 2015-12-18 ENCOUNTER — Encounter: Payer: Self-pay | Admitting: Hematology and Oncology

## 2015-12-18 ENCOUNTER — Other Ambulatory Visit (HOSPITAL_BASED_OUTPATIENT_CLINIC_OR_DEPARTMENT_OTHER): Payer: Medicare Other

## 2015-12-18 ENCOUNTER — Telehealth: Payer: Self-pay | Admitting: *Deleted

## 2015-12-18 ENCOUNTER — Ambulatory Visit: Payer: Medicare Other

## 2015-12-18 ENCOUNTER — Ambulatory Visit (HOSPITAL_BASED_OUTPATIENT_CLINIC_OR_DEPARTMENT_OTHER): Payer: Medicare Other

## 2015-12-18 ENCOUNTER — Telehealth: Payer: Self-pay | Admitting: Hematology and Oncology

## 2015-12-18 DIAGNOSIS — C8591 Non-Hodgkin lymphoma, unspecified, lymph nodes of head, face, and neck: Secondary | ICD-10-CM | POA: Diagnosis not present

## 2015-12-18 DIAGNOSIS — Z170 Estrogen receptor positive status [ER+]: Principal | ICD-10-CM

## 2015-12-18 DIAGNOSIS — C50211 Malignant neoplasm of upper-inner quadrant of right female breast: Secondary | ICD-10-CM

## 2015-12-18 DIAGNOSIS — D801 Nonfamilial hypogammaglobulinemia: Secondary | ICD-10-CM

## 2015-12-18 DIAGNOSIS — D638 Anemia in other chronic diseases classified elsewhere: Secondary | ICD-10-CM | POA: Diagnosis not present

## 2015-12-18 LAB — CBC WITH DIFFERENTIAL/PLATELET
BASO%: 0.1 % (ref 0.0–2.0)
BASOS ABS: 0 10*3/uL (ref 0.0–0.1)
EOS%: 0.6 % (ref 0.0–7.0)
Eosinophils Absolute: 0.1 10*3/uL (ref 0.0–0.5)
HCT: 29.3 % — ABNORMAL LOW (ref 34.8–46.6)
HGB: 9.9 g/dL — ABNORMAL LOW (ref 11.6–15.9)
LYMPH%: 14.3 % (ref 14.0–49.7)
MCH: 29.3 pg (ref 25.1–34.0)
MCHC: 33.9 g/dL (ref 31.5–36.0)
MCV: 86.4 fL (ref 79.5–101.0)
MONO#: 0.7 10*3/uL (ref 0.1–0.9)
MONO%: 7.4 % (ref 0.0–14.0)
NEUT#: 7.1 10*3/uL — ABNORMAL HIGH (ref 1.5–6.5)
NEUT%: 77.6 % — AB (ref 38.4–76.8)
PLATELETS: 307 10*3/uL (ref 145–400)
RBC: 3.39 10*6/uL — AB (ref 3.70–5.45)
RDW: 17.2 % — ABNORMAL HIGH (ref 11.2–14.5)
WBC: 9.1 10*3/uL (ref 3.9–10.3)
lymph#: 1.3 10*3/uL (ref 0.9–3.3)

## 2015-12-18 MED ORDER — DEXAMETHASONE SODIUM PHOSPHATE 10 MG/ML IJ SOLN
INTRAMUSCULAR | Status: AC
  Filled 2015-12-18: qty 1

## 2015-12-18 MED ORDER — IMMUNE GLOBULIN (HUMAN) 10 GM/100ML IV SOLN
400.0000 mg/kg | Freq: Once | INTRAVENOUS | Status: AC
  Administered 2015-12-18: 25 g via INTRAVENOUS
  Filled 2015-12-18: qty 200

## 2015-12-18 MED ORDER — DEXAMETHASONE SODIUM PHOSPHATE 10 MG/ML IJ SOLN
10.0000 mg | Freq: Once | INTRAMUSCULAR | Status: AC
  Administered 2015-12-18: 10 mg via INTRAVENOUS

## 2015-12-18 MED ORDER — HEPARIN SOD (PORK) LOCK FLUSH 100 UNIT/ML IV SOLN
500.0000 [IU] | Freq: Once | INTRAVENOUS | Status: DC | PRN
  Filled 2015-12-18: qty 5

## 2015-12-18 MED ORDER — DIPHENHYDRAMINE HCL 25 MG PO CAPS
25.0000 mg | ORAL_CAPSULE | Freq: Once | ORAL | Status: AC
  Administered 2015-12-18: 25 mg via ORAL

## 2015-12-18 MED ORDER — HEPARIN SOD (PORK) LOCK FLUSH 100 UNIT/ML IV SOLN
500.0000 [IU] | Freq: Once | INTRAVENOUS | Status: AC
  Administered 2015-12-18: 500 [IU] via INTRAVENOUS
  Filled 2015-12-18: qty 5

## 2015-12-18 MED ORDER — DIPHENHYDRAMINE HCL 25 MG PO CAPS
ORAL_CAPSULE | ORAL | Status: AC
  Filled 2015-12-18: qty 1

## 2015-12-18 MED ORDER — SODIUM CHLORIDE 0.9 % IJ SOLN
10.0000 mL | INTRAMUSCULAR | Status: DC | PRN
  Filled 2015-12-18: qty 10

## 2015-12-18 MED ORDER — SODIUM CHLORIDE 0.9% FLUSH
10.0000 mL | INTRAVENOUS | Status: DC | PRN
  Administered 2015-12-18: 10 mL via INTRAVENOUS
  Filled 2015-12-18: qty 10

## 2015-12-18 MED ORDER — SODIUM CHLORIDE 0.9 % IJ SOLN
10.0000 mL | INTRAMUSCULAR | Status: DC | PRN
  Administered 2015-12-18: 10 mL via INTRAVENOUS
  Filled 2015-12-18: qty 10

## 2015-12-18 NOTE — Assessment & Plan Note (Signed)
Repeat imaging study August 2017 showed no evidence of recurrence I will repeat imaging study in 1 yr

## 2015-12-18 NOTE — Assessment & Plan Note (Signed)
This is due to rituximab. She also had mild infusion reaction with prior IVIG. I plan to reduce the dose to 400 mg/kg 1 dose monthly along with premedication. She will get it today She is currently on prophylactic therapy with Keflex to reduce recurrent sepsis Recent urodynamic studies suggested incomplete bladder emptying. She is currently performing self-catheterization twice a day I recommend IVIG monthly and I'll see her back in 2 months

## 2015-12-18 NOTE — Assessment & Plan Note (Signed)
This is likely due to recent treatment and anemia of chronic disease. The patient denies recent history of bleeding such as epistaxis, hematuria or hematochezia. She is asymptomatic from the anemia. I will observe for now.  

## 2015-12-18 NOTE — Addendum Note (Signed)
Addended by: Lin Givens on: 12/18/2015 10:12 AM   Modules accepted: Orders

## 2015-12-18 NOTE — Telephone Encounter (Signed)
Per LOS I have scheduled appts and notified the scheduler 

## 2015-12-18 NOTE — Progress Notes (Signed)
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      01/03/2014 Bone Marrow Biopsy    Accession: [id number] Bone marrow biopsy is negative      01/04/2014 Imaging    ECHO showed normal EF      01/04/2014 Procedure    She has placement of port      01/09/2014 - 03/07/2014 Chemotherapy    She was given treatment with bendamustine with rituximab. Treatment was stopped due to severe side-effects despite significant dose adjustment for cycle 2      01/18/2014 - 02/01/2014 Hospital Admission    She was admitted to the hospital from Escherichia coli sepsis with multiorgan failure and brief episodes of intubation. She was discharged to skilled nursing facility      02/26/2014 Imaging    PET/CT scan showed near complete  remission      02/27/2014 Adverse Reaction    Cycle 2 of treatment was resumed with drastic dose adjustment to bendamustine due to recent multi-organ failure      04/03/2014 - 03/13/2015 Chemotherapy    She is started on maintenance rituximab only.      04/09/2014 - 04/12/2014 Hospital Admission    The patient was admitted to the hospital with urinary tract infection and sepsis.      06/05/2014 Imaging    PET CT scan showed complete response to Rx      02/12/2015 Imaging    Ct scan showed no evidence of disease. It shows she has new pneumonia      04/02/2015 Miscellaneous    She received 1 dose IVIG complicated by mild infusion reaction      04/26/2015 - 04/30/2015 Hospital Admission    She had recurrent admission to the hospital with sepsis      10/22/2015 Imaging    CT scan of chest and abdomen showed Ill-defined tissue in the porta hepatis suggest treated lymphoma. No evidence of lymphadenopathy in the chest, abdomen & pelvis to suggest recurrent lymphoma.       Breast cancer of upper-inner quadrant of right female breast ([place])   11/06/2014 Imaging    DEXA scan showed osteopenia T-1.9 on bilateral femoral neck      03/31/2015 Imaging    Screening mammogram showed suspicious lesion, confirmed on  diagnostic imaging at 2 and 230 position on the right breast      04/10/2015 Pathology Results    Accession: [id number] breast biopsy in 2 locations came back invasive ductal carcinoma with calcification, 100% ER and PR positive, HER 2 neg      04/10/2015 Clinical Stage    Stage IA: T1 N0      05/27/2015 Surgery    Right total mastectomy: multifocal IDC, 1.5 and 1.0 cm, neg margins, ER+ (100% - both); PR+ (100% and 95%), HER2neu negative (ratio 1.69 and 1.14) Ki67 2% and 10%. DCIS      05/27/2015 Pathologic Stage    Stage IA: mpT1c pNx pMx      06/17/2015 Survivorship    Survivorship care plan mailed to patient at her request      07/14/2015 -  Anti-estrogen oral therapy    She  started taking Arimidex       INTERVAL HISTORY: Please see below for problem oriented charting. She returns prior to IVIG treatment. She denies recent infection. She had recent gout and was placed on prednisone. She tolerated all her treatment well without recent side effects. The patient denies any recent signs or symptoms of bleeding such as spontaneous epistaxis, hematuria or hematochezia.   REVIEW OF SYSTEMS:   Constitutional: Denies fevers, chills or abnormal weight loss Eyes: Denies blurriness of vision Ears, nose, mouth, throat, and face: Denies mucositis or sore throat Respiratory: Denies cough, dyspnea or wheezes Cardiovascular: Denies palpitation, chest discomfort or lower extremity swelling Gastrointestinal:  Denies nausea, heartburn or change in bowel habits Skin: Denies abnormal skin rashes Lymphatics: Denies new lymphadenopathy or easy bruising Neurological:Denies numbness, tingling or new weaknesses Behavioral/Psych: Mood is stable, no new changes  All other systems were reviewed with the patient and are negative.  I have reviewed the past medical history, past surgical history, social history and family history with the patient and they are unchanged from previous note.  ALLERGIES:  is allergic to morphine and related; multaq [dronedarone]; demerol; oysters [shellfish allergy]; penicillins; and sulfa drugs cross reactors.  MEDICATIONS:  Current Outpatient Prescriptions  Medication Sig Dispense Refill  . acetaminophen (TYLENOL) 500 MG tablet Take 1,000 mg by mouth every 6 (six) hours as needed for moderate pain, fever or headache.    . albuterol (PROVENTIL HFA;VENTOLIN HFA) 108 (90 Base) MCG/ACT inhaler Inhale into the lungs as directed.    Marland Kitchen albuterol (PROVENTIL) (2.5 MG/3ML) 0.083% nebulizer solution Inhale 3 mLs into the lungs every 6 (six) hours as needed for wheezing or shortness of breath.     . anastrozole (ARIMIDEX) 1 MG tablet Take 1 tablet (1 mg total) by  mouth daily. 90 tablet 3  . Cholecalciferol (VITAMIN D) 2000 UNITS tablet Take 2,000 Units by mouth daily with lunch.    . diclofenac sodium (VOLTAREN) 1 % GEL Apply 1 application topically 4 (four) times daily as needed.    . diltiazem (CARDIZEM CD) 120 MG 24 hr capsule Take 1 capsule (120 mg total) by mouth daily. 30 capsule 6  . Fluticasone-Salmeterol (ADVAIR) 250-50 MCG/DOSE AEPB Inhale 2 puffs into the lungs every 12 (twelve) hours.     . hydrochlorothiazide (HYDRODIURIL) 25 MG tablet Take 37.5 mg by mouth daily as needed (swelling).    Marland Kitchen levothyroxine (SYNTHROID, LEVOTHROID) 200 MCG tablet Take 200 mcg by mouth daily before breakfast.    . lidocaine-prilocaine (EMLA) cream Apply 1 application topically as needed. Apply to [facility] a Cath site at least one hour prior to needle  stick as needed. 30 g 10  . Magnesium Oxide 500 MG TABS Take 500 mg by mouth daily.     . metFORMIN (GLUCOPHAGE-XR) 500 MG 24 hr tablet Take 500 mg by mouth 2 (two) times daily with a meal.    . montelukast (SINGULAIR) 10 MG tablet Take 10 mg by mouth daily.    . Omega-3-acid Ethyl Esters (LOVAZA PO) Take 1 capsule by mouth 2 (two) times daily.    . pravastatin (PRAVACHOL) 40 MG tablet Take 20 mg by mouth See admin instructions. Take 1/2 tablet (20 mg) ONLY on Monday, Tuesday, Wednesday, and Thursday Nights.    . predniSONE (DELTASONE) 5 MG tablet Take 20 mg by mouth daily. Tapering dose    . rivaroxaban (XARELTO) 20 MG TABS tablet Take 20 mg by mouth daily with supper.    . traMADol-acetaminophen (ULTRACET) 37.5-325 MG tablet Take 1 tablet by mouth every 6 (six) hours as needed (Pain).    . vitamin B-12 (CYANOCOBALAMIN) 500 MCG tablet Take 500 mcg by mouth daily.     No current facility-administered medications for this visit.     PHYSICAL EXAMINATION: ECOG PERFORMANCE STATUS: 1 - Symptomatic but completely ambulatory  Vitals:   12/18/15 0900  BP: (!) 149/72  Pulse: 94  Resp: 17  Temp: 97.9 F (36.6 C)   Filed  Weights   12/18/15 0900  Weight: 145 lb 12.8 oz (66.1 kg)    GENERAL:alert, no distress and comfortable SKIN: skin color, texture, turgor are normal, no rashes or significant lesions EYES: normal, Conjunctiva are pink and non-injected, sclera clear OROPHARYNX:no exudate, no erythema and lips, buccal mucosa, and tongue normal  NECK: supple, thyroid normal size, non-tender, without nodularity LYMPH:  no palpable lymphadenopathy in the cervical, axillary or inguinal LUNGS: clear to auscultation and percussion with normal breathing effort HEART: regular rate & rhythm and no murmurs and no lower extremity edema ABDOMEN:abdomen soft, non-tender and normal bowel sounds Musculoskeletal:no cyanosis of digits and no clubbing  NEURO: alert & oriented x 3 with fluent speech, no focal motor/sensory deficits  LABORATORY DATA:  I have reviewed the data as listed    Component Value Date/Time   NA 140 10/22/2015 1220   K 4.0 10/22/2015 1220   CL 108 08/20/2015 0430   CO2 25 10/22/2015 1220   GLUCOSE 128 10/22/2015 1220   BUN 18.0 10/22/2015 1220   CREATININE 0.8 10/22/2015 1220   CALCIUM 10.0 10/22/2015 1220   PROT 7.1 10/22/2015 1220   ALBUMIN 4.0 10/22/2015 1220   AST 16 10/22/2015 1220   ALT 13 10/22/2015 1220   ALKPHOS 69 10/22/2015 1220   BILITOT 0.42 10/22/2015 1220   GFRNONAA >60 08/20/2015 0430   GFRAA >60 08/20/2015 0430    No results found for: SPEP, UPEP  Lab Results  Component Value Date   WBC 9.1 12/18/2015   NEUTROABS 7.1 (H) 12/18/2015   HGB 9.9 (L) 12/18/2015   HCT 29.3 (L) 12/18/2015   MCV 86.4 12/18/2015   PLT 307 12/18/2015      Chemistry      Component Value Date/Time   NA 140 10/22/2015 1220   K 4.0 10/22/2015 1220   CL 108 08/20/2015 0430   CO2 25 10/22/2015 1220   BUN 18.0 10/22/2015 1220   CREATININE 0.8 10/22/2015 1220      Component Value Date/Time   CALCIUM 10.0 10/22/2015 1220   ALKPHOS 69 10/22/2015 1220   AST 16 10/22/2015 1220   ALT 13  10/22/2015 1220  BILITOT 0.42 10/22/2015 1220       ASSESSMENT & PLAN:  Breast cancer of upper-inner quadrant of right female breast ([place]) She is taking Arimidex since 07/14/2015 as part of the adjuvant treatment for breast cancer. So far, she tolerated treatment well without any side effects. 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 She also had mild infusion reaction with prior IVIG. I plan to reduce the dose to 400 mg/kg 1 dose monthly along with premedication. She will get it today She is currently on prophylactic therapy with Keflex to reduce recurrent sepsis Recent urodynamic studies suggested incomplete bladder emptying. She is currently performing self-catheterization twice a day I recommend IVIG monthly and I'll see her back in 2 months  Anemia in chronic illness This is likely due to recent treatment and anemia of chronic disease. The patient denies recent history of bleeding such as epistaxis, hematuria or hematochezia. She is asymptomatic from the anemia. I will observe for now.      No orders of the defined types were placed in this encounter.  All questions were answered. The patient knows to call the clinic with any problems, questions or concerns. No barriers to learning was detected. I spent 15 minutes counseling the patient face to face. The total time spent in the appointment was 20 minutes and more than 50% was on counseling and review of test results     Heath Lark, MD 12/18/2015 9:20 AM

## 2015-12-18 NOTE — Telephone Encounter (Signed)
Message sent to chemo scheduler to add chemo. AVS report and appointment schedule given to patient, per 12/18/15 los. °

## 2015-12-18 NOTE — Assessment & Plan Note (Signed)
She is taking Arimidex since 07/14/2015 as part of the adjuvant treatment for breast cancer. So far, she tolerated treatment well without any side effects. I will continue to see her on the regular basis including breast examination and will make sure that she get regular screening mammogram, next due January

## 2015-12-18 NOTE — Patient Instructions (Signed)

## 2015-12-29 DIAGNOSIS — M11262 Other chondrocalcinosis, left knee: Secondary | ICD-10-CM | POA: Diagnosis not present

## 2015-12-29 DIAGNOSIS — M25462 Effusion, left knee: Secondary | ICD-10-CM | POA: Diagnosis not present

## 2015-12-29 DIAGNOSIS — M1712 Unilateral primary osteoarthritis, left knee: Secondary | ICD-10-CM | POA: Diagnosis not present

## 2015-12-29 DIAGNOSIS — M1180 Other specified crystal arthropathies, unspecified site: Secondary | ICD-10-CM | POA: Diagnosis not present

## 2015-12-29 DIAGNOSIS — M25562 Pain in left knee: Secondary | ICD-10-CM | POA: Diagnosis not present

## 2015-12-29 DIAGNOSIS — M109 Gout, unspecified: Secondary | ICD-10-CM | POA: Diagnosis not present

## 2016-01-12 DIAGNOSIS — Z1212 Encounter for screening for malignant neoplasm of rectum: Secondary | ICD-10-CM | POA: Diagnosis not present

## 2016-01-12 DIAGNOSIS — Z1211 Encounter for screening for malignant neoplasm of colon: Secondary | ICD-10-CM | POA: Diagnosis not present

## 2016-01-14 ENCOUNTER — Encounter: Payer: Self-pay | Admitting: Gynecology

## 2016-01-14 ENCOUNTER — Ambulatory Visit (INDEPENDENT_AMBULATORY_CARE_PROVIDER_SITE_OTHER): Payer: Medicare Other | Admitting: Gynecology

## 2016-01-14 VITALS — BP 140/74 | Ht 66.0 in | Wt 143.0 lb

## 2016-01-14 DIAGNOSIS — Z01419 Encounter for gynecological examination (general) (routine) without abnormal findings: Secondary | ICD-10-CM

## 2016-01-14 DIAGNOSIS — Z8572 Personal history of non-Hodgkin lymphomas: Secondary | ICD-10-CM

## 2016-01-14 DIAGNOSIS — Z853 Personal history of malignant neoplasm of breast: Secondary | ICD-10-CM

## 2016-01-14 NOTE — Progress Notes (Signed)
VARSHA KNOCK May 31, 1940 [id number]   History:    75 y.o.  for annual gyn exam with no complaints today. Review of patient's record indicated she had a colonoscopy with benign polyps removed back in 2011 her PCP recently provided her with the Cologuard colon cancer screening ^ which she submitted and results are pending. 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 DCIS   She is no longer using vaginal estrogen for atrophy. She had a bone density study in 2016 With dual T scores of -1.9 (osteopenia).Patient is also been followed by the urologist Dr. Irine Seal because of past history of recurrent UTIs. She uses vaginal estrogen cream 3 times a week for her severe vaginal atrophy.Her PCP is Dr. Shelia Media please see past medical history and medication list in chart for details. Patient in 48 had a total abdominal hysterectomy as a result of fibroid uterus along with a pubovaginal sling in 1998. Patient denies any stress urinary incontinence..   Past medical history,surgical history, family history and social history were all reviewed and documented in the EPIC chart.  Gynecologic History No LMP recorded. Patient has had a hysterectomy. Contraception: status post hysterectomy Last Pap: Several years ago. Results were: normal Last mammogram: See above. Results were: See above  Obstetric History OB History  Gravida Para Term Preterm AB Living  _0 SAB TAB Ectopic Multiple Live Births               # Outcome Date GA Lbr Len/2nd Weight Sex Delivery Anes PTL Lv  2  Term           1 Term                ROS: A ROS was performed and pertinent positives and negatives are included in the history.  GENERAL: No fevers or chills. HEENT: No change in vision, no earache, sore throat or sinus congestion. NECK: No pain or stiffness. CARDIOVASCULAR: No chest pain or pressure. No palpitations. PULMONARY: No shortness of breath, cough or wheeze. GASTROINTESTINAL: No abdominal pain, nausea, vomiting or diarrhea, melena or bright red blood per rectum. GENITOURINARY: No urinary frequency, urgency, hesitancy or dysuria. MUSCULOSKELETAL: No joint or muscle pain, no back pain, no recent trauma. DERMATOLOGIC: No rash, no itching, no lesions. ENDOCRINE: No polyuria, polydipsia, no heat or cold intolerance. No recent change in weight. HEMATOLOGICAL: No anemia or easy bruising or bleeding. NEUROLOGIC: No headache, seizures, numbness, tingling or weakness. PSYCHIATRIC: No depression, no loss of interest in normal activity or change in sleep pattern.     Exam: chaperone present  BP 140/74   Ht _1  (1.676 m)   Wt 143 lb (64.9 kg)   BMI 23.08 kg/m   Body mass index is 23.08 kg/m.  General appearance : Well developed well nourished female. No acute distress HEENT: Eyes: no retinal hemorrhage or exudates,  Neck supple, trachea midline, no carotid bruits, no thyroidmegaly Lungs: Clear to auscultation, no  rhonchi or wheezes, or rib retractions  Heart: Regular rate and rhythm, no murmurs or gallops Breast:Examined in sitting and supine position were symmetrical in appearance, no palpable masses or tenderness,  no skin retraction, no nipple inversion, no nipple discharge, no skin discoloration, no axillary or supraclavicular lymphadenopathy Abdomen: no palpable masses or tenderness, no rebound or guarding Extremities: no edema or skin discoloration or tenderness  Pelvic:  Bartholin, Urethra, Skene Glands: Within normal limits             Vagina: No gross lesions or discharge,  atrophic changes  Cervix: Absent  Uterus  absent  Adnexa  Without masses or tenderness  Anus and perineum  normal   Rectovaginal  normal sphincter tone without palpated masses or tenderness             Hemoccult Cologuard recently submitted     Assessment/Plan:  75 y.o. female for annual exam with recently diagnosed breast cancer (right) currently on  Arimedex and history of lymphoma being followed by her medical oncologist. Her blood work has been drawn by Lucianne Lei as well as her PCP. She states her vaccines are up-to-date. For vaginal moisturizer she can use Replens. We discussed importance of calcium vitamin D and weightbearing exercises for osteoporosis prevention.   Terrance Mass MD, 9:31 AM 01/14/2016

## 2016-01-15 ENCOUNTER — Ambulatory Visit (HOSPITAL_BASED_OUTPATIENT_CLINIC_OR_DEPARTMENT_OTHER): Payer: Medicare Other

## 2016-01-15 ENCOUNTER — Other Ambulatory Visit (HOSPITAL_BASED_OUTPATIENT_CLINIC_OR_DEPARTMENT_OTHER): Payer: Medicare Other

## 2016-01-15 ENCOUNTER — Ambulatory Visit: Payer: Medicare Other

## 2016-01-15 VITALS — BP 148/73 | HR 99 | Temp 98.0°F | Resp 18

## 2016-01-15 DIAGNOSIS — C8591 Non-Hodgkin lymphoma, unspecified, lymph nodes of head, face, and neck: Secondary | ICD-10-CM

## 2016-01-15 DIAGNOSIS — D801 Nonfamilial hypogammaglobulinemia: Secondary | ICD-10-CM | POA: Diagnosis present

## 2016-01-15 LAB — CBC WITH DIFFERENTIAL/PLATELET
BASO%: 0.2 % (ref 0.0–2.0)
BASOS ABS: 0 10*3/uL (ref 0.0–0.1)
EOS ABS: 0.1 10*3/uL (ref 0.0–0.5)
EOS%: 1.2 % (ref 0.0–7.0)
HCT: 30.9 % — ABNORMAL LOW (ref 34.8–46.6)
HEMOGLOBIN: 10 g/dL — AB (ref 11.6–15.9)
LYMPH%: 21.5 % (ref 14.0–49.7)
MCH: 28.6 pg (ref 25.1–34.0)
MCHC: 32.4 g/dL (ref 31.5–36.0)
MCV: 88.3 fL (ref 79.5–101.0)
MONO#: 0.6 10*3/uL (ref 0.1–0.9)
MONO%: 9.6 % (ref 0.0–14.0)
NEUT%: 67.5 % (ref 38.4–76.8)
NEUTROS ABS: 3.9 10*3/uL (ref 1.5–6.5)
PLATELETS: 219 10*3/uL (ref 145–400)
RBC: 3.5 10*6/uL — AB (ref 3.70–5.45)
RDW: 15.2 % — ABNORMAL HIGH (ref 11.2–14.5)
WBC: 5.7 10*3/uL (ref 3.9–10.3)
lymph#: 1.2 10*3/uL (ref 0.9–3.3)

## 2016-01-15 MED ORDER — IMMUNE GLOBULIN (HUMAN) 10 GM/100ML IV SOLN
400.0000 mg/kg | Freq: Once | INTRAVENOUS | Status: AC
  Administered 2016-01-15: 25 g via INTRAVENOUS
  Filled 2016-01-15: qty 200

## 2016-01-15 MED ORDER — DEXTROSE 5 % IV SOLN
Freq: Once | INTRAVENOUS | Status: AC
  Administered 2016-01-15: 10:00:00 via INTRAVENOUS

## 2016-01-15 MED ORDER — DEXAMETHASONE SODIUM PHOSPHATE 10 MG/ML IJ SOLN
10.0000 mg | Freq: Once | INTRAMUSCULAR | Status: AC
Start: 2016-01-15 — End: 2016-01-15
  Administered 2016-01-15: 10 mg via INTRAVENOUS

## 2016-01-15 MED ORDER — SODIUM CHLORIDE 0.9 % IJ SOLN
10.0000 mL | INTRAMUSCULAR | Status: DC | PRN
  Administered 2016-01-15: 10 mL via INTRAVENOUS
  Filled 2016-01-15: qty 10

## 2016-01-15 MED ORDER — DEXAMETHASONE SODIUM PHOSPHATE 10 MG/ML IJ SOLN
INTRAMUSCULAR | Status: AC
  Filled 2016-01-15: qty 1

## 2016-01-15 MED ORDER — DIPHENHYDRAMINE HCL 25 MG PO CAPS
25.0000 mg | ORAL_CAPSULE | Freq: Once | ORAL | Status: AC
  Administered 2016-01-15: 25 mg via ORAL

## 2016-01-15 MED ORDER — HEPARIN SOD (PORK) LOCK FLUSH 100 UNIT/ML IV SOLN
500.0000 [IU] | Freq: Once | INTRAVENOUS | Status: AC
  Administered 2016-01-15: 500 [IU] via INTRAVENOUS
  Filled 2016-01-15: qty 5

## 2016-01-15 MED ORDER — SODIUM CHLORIDE 0.9% FLUSH
10.0000 mL | INTRAVENOUS | Status: DC | PRN
  Administered 2016-01-15: 10 mL via INTRAVENOUS
  Filled 2016-01-15: qty 10

## 2016-01-15 MED ORDER — DIPHENHYDRAMINE HCL 25 MG PO CAPS
ORAL_CAPSULE | ORAL | Status: AC
  Filled 2016-01-15: qty 1

## 2016-01-15 NOTE — Progress Notes (Signed)
Pt tolerated infusion well. Pt refused to stay 30 minute post observation. Pt and VS stable at discharge.

## 2016-01-15 NOTE — Patient Instructions (Signed)

## 2016-01-20 ENCOUNTER — Telehealth: Payer: Self-pay | Admitting: *Deleted

## 2016-01-20 NOTE — Telephone Encounter (Signed)
Pt states she has had a cold and low grade fever this past 3-4 days. Temp 99, normal for her is 97.8. She is going to call PCP. Wanted to make Dr Alvy Bimler aware

## 2016-01-21 DIAGNOSIS — Z79899 Other long term (current) drug therapy: Secondary | ICD-10-CM | POA: Diagnosis not present

## 2016-01-21 DIAGNOSIS — J209 Acute bronchitis, unspecified: Secondary | ICD-10-CM | POA: Diagnosis not present

## 2016-01-21 DIAGNOSIS — J45909 Unspecified asthma, uncomplicated: Secondary | ICD-10-CM | POA: Diagnosis not present

## 2016-01-29 DIAGNOSIS — I10 Essential (primary) hypertension: Secondary | ICD-10-CM | POA: Diagnosis not present

## 2016-01-29 DIAGNOSIS — M545 Low back pain: Secondary | ICD-10-CM | POA: Diagnosis not present

## 2016-01-29 DIAGNOSIS — Z23 Encounter for immunization: Secondary | ICD-10-CM | POA: Diagnosis not present

## 2016-02-09 DIAGNOSIS — M25562 Pain in left knee: Secondary | ICD-10-CM | POA: Diagnosis not present

## 2016-02-09 DIAGNOSIS — M109 Gout, unspecified: Secondary | ICD-10-CM | POA: Diagnosis not present

## 2016-02-09 DIAGNOSIS — M1180 Other specified crystal arthropathies, unspecified site: Secondary | ICD-10-CM | POA: Diagnosis not present

## 2016-02-09 DIAGNOSIS — M25462 Effusion, left knee: Secondary | ICD-10-CM | POA: Diagnosis not present

## 2016-02-11 DIAGNOSIS — N312 Flaccid neuropathic bladder, not elsewhere classified: Secondary | ICD-10-CM | POA: Diagnosis not present

## 2016-02-12 ENCOUNTER — Encounter: Payer: Self-pay | Admitting: Hematology and Oncology

## 2016-02-12 ENCOUNTER — Telehealth: Payer: Self-pay | Admitting: Hematology and Oncology

## 2016-02-12 ENCOUNTER — Ambulatory Visit: Payer: Medicare Other

## 2016-02-12 ENCOUNTER — Ambulatory Visit (HOSPITAL_BASED_OUTPATIENT_CLINIC_OR_DEPARTMENT_OTHER): Payer: Medicare Other

## 2016-02-12 ENCOUNTER — Other Ambulatory Visit (HOSPITAL_BASED_OUTPATIENT_CLINIC_OR_DEPARTMENT_OTHER): Payer: Medicare Other

## 2016-02-12 ENCOUNTER — Telehealth: Payer: Self-pay | Admitting: *Deleted

## 2016-02-12 ENCOUNTER — Ambulatory Visit (HOSPITAL_BASED_OUTPATIENT_CLINIC_OR_DEPARTMENT_OTHER): Payer: Medicare Other | Admitting: Hematology and Oncology

## 2016-02-12 VITALS — BP 123/58 | HR 86 | Temp 98.2°F | Resp 18

## 2016-02-12 DIAGNOSIS — D801 Nonfamilial hypogammaglobulinemia: Secondary | ICD-10-CM | POA: Diagnosis not present

## 2016-02-12 DIAGNOSIS — C8591 Non-Hodgkin lymphoma, unspecified, lymph nodes of head, face, and neck: Secondary | ICD-10-CM

## 2016-02-12 DIAGNOSIS — D638 Anemia in other chronic diseases classified elsewhere: Secondary | ICD-10-CM | POA: Diagnosis not present

## 2016-02-12 DIAGNOSIS — I482 Chronic atrial fibrillation, unspecified: Secondary | ICD-10-CM

## 2016-02-12 DIAGNOSIS — C50211 Malignant neoplasm of upper-inner quadrant of right female breast: Secondary | ICD-10-CM | POA: Diagnosis not present

## 2016-02-12 DIAGNOSIS — Z170 Estrogen receptor positive status [ER+]: Secondary | ICD-10-CM

## 2016-02-12 LAB — CBC WITH DIFFERENTIAL/PLATELET
BASO%: 0.3 % (ref 0.0–2.0)
Basophils Absolute: 0 10*3/uL (ref 0.0–0.1)
EOS ABS: 0.1 10*3/uL (ref 0.0–0.5)
EOS%: 1.3 % (ref 0.0–7.0)
HEMATOCRIT: 29.2 % — AB (ref 34.8–46.6)
HEMOGLOBIN: 9.4 g/dL — AB (ref 11.6–15.9)
LYMPH#: 1.1 10*3/uL (ref 0.9–3.3)
LYMPH%: 16.3 % (ref 14.0–49.7)
MCH: 27.9 pg (ref 25.1–34.0)
MCHC: 32.2 g/dL (ref 31.5–36.0)
MCV: 86.6 fL (ref 79.5–101.0)
MONO#: 0.8 10*3/uL (ref 0.1–0.9)
MONO%: 11.4 % (ref 0.0–14.0)
NEUT#: 4.9 10*3/uL (ref 1.5–6.5)
NEUT%: 70.7 % (ref 38.4–76.8)
NRBC: 0 % (ref 0–0)
PLATELETS: 217 10*3/uL (ref 145–400)
RBC: 3.37 10*6/uL — ABNORMAL LOW (ref 3.70–5.45)
RDW: 15.5 % — ABNORMAL HIGH (ref 11.2–14.5)
WBC: 6.9 10*3/uL (ref 3.9–10.3)

## 2016-02-12 MED ORDER — DEXTROSE 5 % IV SOLN
Freq: Once | INTRAVENOUS | Status: AC
  Administered 2016-02-12: 10:00:00 via INTRAVENOUS

## 2016-02-12 MED ORDER — SODIUM CHLORIDE 0.9 % IJ SOLN
10.0000 mL | INTRAMUSCULAR | Status: DC | PRN
  Administered 2016-02-12: 10 mL via INTRAVENOUS
  Filled 2016-02-12: qty 10

## 2016-02-12 MED ORDER — DEXAMETHASONE SODIUM PHOSPHATE 10 MG/ML IJ SOLN
INTRAMUSCULAR | Status: AC
  Filled 2016-02-12: qty 1

## 2016-02-12 MED ORDER — IMMUNE GLOBULIN (HUMAN) 10 GM/100ML IV SOLN
400.0000 mg/kg | Freq: Once | INTRAVENOUS | Status: AC
  Administered 2016-02-12: 25 g via INTRAVENOUS
  Filled 2016-02-12: qty 200

## 2016-02-12 MED ORDER — DEXAMETHASONE SODIUM PHOSPHATE 10 MG/ML IJ SOLN
10.0000 mg | Freq: Once | INTRAMUSCULAR | Status: AC
  Administered 2016-02-12: 10 mg via INTRAVENOUS

## 2016-02-12 MED ORDER — DIPHENHYDRAMINE HCL 25 MG PO CAPS
ORAL_CAPSULE | ORAL | Status: AC
  Filled 2016-02-12: qty 1

## 2016-02-12 MED ORDER — HEPARIN SOD (PORK) LOCK FLUSH 100 UNIT/ML IV SOLN
500.0000 [IU] | Freq: Once | INTRAVENOUS | Status: DC | PRN
  Filled 2016-02-12: qty 5

## 2016-02-12 MED ORDER — DIPHENHYDRAMINE HCL 25 MG PO CAPS
25.0000 mg | ORAL_CAPSULE | Freq: Once | ORAL | Status: AC
  Administered 2016-02-12: 25 mg via ORAL

## 2016-02-12 MED ORDER — HEPARIN SOD (PORK) LOCK FLUSH 100 UNIT/ML IV SOLN
500.0000 [IU] | Freq: Once | INTRAVENOUS | Status: AC | PRN
  Administered 2016-02-12: 500 [IU] via INTRAVENOUS
  Filled 2016-02-12: qty 5

## 2016-02-12 MED ORDER — SODIUM CHLORIDE 0.9 % IJ SOLN
10.0000 mL | INTRAMUSCULAR | Status: DC | PRN
  Administered 2016-02-12 (×2): 10 mL via INTRAVENOUS
  Filled 2016-02-12: qty 10

## 2016-02-12 NOTE — Assessment & Plan Note (Signed)
This is likely due to recent treatment and anemia of chronic disease. The patient denies recent history of bleeding such as epistaxis, hematuria or hematochezia. She is asymptomatic from the anemia. I will observe for now.  

## 2016-02-12 NOTE — Assessment & Plan Note (Signed)
Repeat imaging study August 2017 showed no evidence of recurrence I will repeat imaging study in 1 yr

## 2016-02-12 NOTE — Telephone Encounter (Signed)
Message sent to infusion scheduler to be added per 02/12/16 los. Appointments scheduled per 02/12/16 los. A copy of the appointment schedule and AVS report, was given to th e patient per 02/12/16 los.

## 2016-02-12 NOTE — Patient Instructions (Signed)

## 2016-02-12 NOTE — Assessment & Plan Note (Signed)
This is due to rituximab. I recommend IVIG monthly and I'll see her back in 2 months 

## 2016-02-12 NOTE — Progress Notes (Signed)
[facility] OFFICE PROGRESS NOTE  Patient Care Team: Deland Pretty, MD as PCP - General (Internal Medicine) Adrian Prows, MD as Consulting Physician (Cardiology) Heath Lark, MD as Consulting Physician (Hematology and Oncology) Fanny Skates, MD as Consulting Physician (General Surgery) Eppie Gibson, MD as Attending Physician (Radiation Oncology) Sylvan Cheese, NP as Nurse Practitioner (Hematology and Oncology) Thompson Grayer, MD as Consulting Physician (Cardiology) Lahoma Rocker, MD as Consulting Physician (Rheumatology) Terrance Mass, MD as Consulting Physician (Gynecology)  SUMMARY OF ONCOLOGIC HISTORY: Oncology History   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      01/03/2014 Bone Marrow Biopsy    Accession: [id number] Bone marrow biopsy is negative      01/04/2014 Imaging    ECHO showed normal EF      01/04/2014 Procedure    She has placement of port      01/09/2014 - 03/07/2014 Chemotherapy    She was given treatment with bendamustine with rituximab. Treatment was stopped due to severe side-effects despite significant dose adjustment for cycle 2      01/18/2014 - 02/01/2014 Hospital Admission    She was admitted to the hospital from Escherichia coli sepsis with multiorgan failure and brief episodes of  intubation. She was discharged to skilled nursing facility      02/26/2014 Imaging    PET/CT scan showed near complete remission      02/27/2014 Adverse Reaction    Cycle 2 of treatment was resumed with drastic dose adjustment to bendamustine due to recent multi-organ failure      04/03/2014 - 03/13/2015 Chemotherapy    She is started on maintenance rituximab only.      04/09/2014 - 04/12/2014 Hospital Admission    The patient was admitted to the hospital with urinary tract infection and sepsis.      06/05/2014 Imaging    PET CT scan showed complete response to Rx      02/12/2015 Imaging    Ct scan showed no evidence of disease. It shows she has new pneumonia      04/02/2015 Miscellaneous    She received 1 dose IVIG complicated by mild infusion reaction      04/26/2015 - 04/30/2015 Hospital Admission    She had recurrent admission to the hospital with sepsis      10/22/2015 Imaging    CT scan of chest and abdomen showed Ill-defined tissue in the porta hepatis suggest treated lymphoma. No evidence of lymphadenopathy in the chest, abdomen & pelvis to suggest recurrent lymphoma.       Breast cancer of upper-inner quadrant of right female breast ([place])   11/06/2014 Imaging    DEXA scan showed osteopenia T-1.9 on bilateral femoral neck  03/31/2015 Imaging    Screening mammogram showed suspicious lesion, confirmed on diagnostic imaging at 2 and 230 position on the right breast      04/10/2015 Pathology Results    Accession: [id number] breast biopsy in 2 locations came back invasive ductal carcinoma with calcification, 100% ER and PR positive, HER 2 neg      04/10/2015 Clinical Stage    Stage IA: T1 N0      05/27/2015 Surgery    Right total mastectomy: multifocal IDC, 1.5 and 1.0 cm, neg margins, ER+ (100% - both); PR+ (100% and 95%), HER2neu negative (ratio 1.69 and 1.14) Ki67 2% and 10%. DCIS      05/27/2015 Pathologic Stage    Stage IA: mpT1c pNx pMx      06/17/2015  Survivorship    Survivorship care plan mailed to patient at her request      07/14/2015 -  Anti-estrogen oral therapy    She started taking Arimidex       INTERVAL HISTORY: Please see below for problem oriented charting. She returns for follow-up. She had cold-like symptoms a month ago. She was prescribed Z-Pak by her primary care doctor. She have weakness and chronic shortness of breath but denies chest pain or dizziness. No recent recurrent urinary tract infection. The patient denies any recent signs or symptoms of bleeding such as spontaneous epistaxis, hematuria or hematochezia. She denies new lymphadenopathy. She tolerated recent IVIG without side effects or infusion reaction.  REVIEW OF SYSTEMS:   Constitutional: Denies fevers, chills or abnormal weight loss Eyes: Denies blurriness of vision Ears, nose, mouth, throat, and face: Denies mucositis or sore throat Cardiovascular: Denies palpitation, chest discomfort or lower extremity swelling Gastrointestinal:  Denies nausea, heartburn or change in bowel habits Skin: Denies abnormal skin rashes Lymphatics: Denies new lymphadenopathy or easy bruising Neurological:Denies numbness, tingling or new weaknesses Behavioral/Psych: Mood is stable, no new changes  All other systems were reviewed with the patient and are negative.  I have reviewed the past medical history, past surgical history, social history and family history with the patient and they are unchanged from previous note.  ALLERGIES:  is allergic to morphine and related; multaq [dronedarone]; demerol; oysters [shellfish allergy]; penicillins; and sulfa drugs cross reactors.  MEDICATIONS:  Current Outpatient Prescriptions  Medication Sig Dispense Refill  . acetaminophen (TYLENOL) 500 MG tablet Take 1,000 mg by mouth every 6 (six) hours as needed for moderate pain, fever or headache.    . albuterol (PROVENTIL HFA;VENTOLIN HFA) 108 (90 Base) MCG/ACT inhaler Inhale into the  lungs as directed.    Marland Kitchen albuterol (PROVENTIL) (2.5 MG/3ML) 0.083% nebulizer solution Inhale 3 mLs into the lungs every 6 (six) hours as needed for wheezing or shortness of breath.     . anastrozole (ARIMIDEX) 1 MG tablet Take 1 tablet (1 mg total) by mouth daily. 90 tablet 3  . Cholecalciferol (VITAMIN D) 2000 UNITS tablet Take 2,000 Units by mouth daily with lunch.    . diclofenac sodium (VOLTAREN) 1 % GEL Apply 1 application topically 4 (four) times daily as needed.    . diltiazem (CARDIZEM CD) 120 MG 24 hr capsule Take 1 capsule (120 mg total) by mouth daily. 30 capsule 6  . Fluticasone-Salmeterol (ADVAIR) 250-50 MCG/DOSE AEPB Inhale 2 puffs into the lungs every 12 (twelve) hours.     Marland Kitchen levothyroxine (SYNTHROID, LEVOTHROID) 200 MCG tablet Take 200 mcg by mouth daily before breakfast.    . lidocaine-prilocaine (EMLA) cream Apply 1 application topically as needed. Apply  to [facility] a Cath site at least one hour prior to needle stick as needed. (Patient not taking: Reported on 01/14/2016) 30 g 10  . Magnesium Oxide 500 MG TABS Take 500 mg by mouth daily.     . metFORMIN (GLUCOPHAGE-XR) 500 MG 24 hr tablet Take 500 mg by mouth 2 (two) times daily with a meal.    . montelukast (SINGULAIR) 10 MG tablet Take 10 mg by mouth daily.    . Omega-3-acid Ethyl Esters (LOVAZA PO) Take 1 capsule by mouth 2 (two) times daily.    . pravastatin (PRAVACHOL) 40 MG tablet Take 20 mg by mouth See admin instructions. Take 1/2 tablet (20 mg) ONLY on Monday, Tuesday, Wednesday, and Thursday Nights.    . predniSONE (DELTASONE) 5 MG tablet Take 20 mg by mouth daily. Tapering dose    . rivaroxaban (XARELTO) 20 MG TABS tablet Take 20 mg by mouth daily with supper.    . traMADol-acetaminophen (ULTRACET) 37.5-325 MG tablet Take 1 tablet by mouth every 6 (six) hours as needed (Pain).    . vitamin B-12 (CYANOCOBALAMIN) 500 MCG tablet Take 500 mcg by mouth daily.     No current facility-administered medications for this visit.      PHYSICAL EXAMINATION: ECOG PERFORMANCE STATUS: 1 - Symptomatic but completely ambulatory  Vitals:   02/12/16 0903  BP: 131/61  Pulse: 88  Resp: 18  Temp: 98.5 F (36.9 C)   Filed Weights   02/12/16 0903  Weight: 147 lb (66.7 kg)    GENERAL:alert, no distress and comfortable SKIN: skin color, texture, turgor are normal, no rashes or significant lesions EYES: normal, Conjunctiva are pink and non-injected, sclera clear OROPHARYNX:no exudate, no erythema and lips, buccal mucosa, and tongue normal  NECK: supple, thyroid normal size, non-tender, without nodularity LYMPH:  no palpable lymphadenopathy in the cervical, axillary or inguinal LUNGS: clear to auscultation and percussion with normal breathing effort HEART: regular rate & rhythm and no murmurs and no lower extremity edema ABDOMEN:abdomen soft, non-tender and normal bowel sounds Musculoskeletal:no cyanosis of digits and no clubbing  NEURO: alert & oriented x 3 with fluent speech, no focal motor/sensory deficits  LABORATORY DATA:  I have reviewed the data as listed    Component Value Date/Time   NA 140 10/22/2015 1220   K 4.0 10/22/2015 1220   CL 108 08/20/2015 0430   CO2 25 10/22/2015 1220   GLUCOSE 128 10/22/2015 1220   BUN 18.0 10/22/2015 1220   CREATININE 0.8 10/22/2015 1220   CALCIUM 10.0 10/22/2015 1220   PROT 7.1 10/22/2015 1220   ALBUMIN 4.0 10/22/2015 1220   AST 16 10/22/2015 1220   ALT 13 10/22/2015 1220   ALKPHOS 69 10/22/2015 1220   BILITOT 0.42 10/22/2015 1220   GFRNONAA >60 08/20/2015 0430   GFRAA >60 08/20/2015 0430    No results found for: SPEP, UPEP  Lab Results  Component Value Date   WBC 6.9 02/12/2016   NEUTROABS 4.9 02/12/2016   HGB 9.4 (L) 02/12/2016   HCT 29.2 (L) 02/12/2016   MCV 86.6 02/12/2016   PLT 217 02/12/2016      Chemistry      Component Value Date/Time   NA 140 10/22/2015 1220   K 4.0 10/22/2015 1220   CL 108 08/20/2015 0430   CO2 25 10/22/2015 1220   BUN 18.0  10/22/2015 1220   CREATININE 0.8 10/22/2015 1220      Component Value Date/Time   CALCIUM 10.0 10/22/2015 1220   ALKPHOS 69 10/22/2015  1220   AST 16 10/22/2015 1220   ALT 13 10/22/2015 1220   BILITOT 0.42 10/22/2015 1220       ASSESSMENT & PLAN:  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 So far, she tolerated treatment well without any side effects. 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 I recommend IVIG monthly and I'll see her back in 2 months  Anemia in chronic illness This is likely due to recent treatment and anemia of chronic disease. The patient denies recent history of bleeding such as epistaxis, hematuria or hematochezia. She is asymptomatic from the anemia. I will observe for now.     Chronic atrial fibrillation (HCC) She has chronic atrial fibrillation, s/p ablation and now in sinus rhythm Currently, she is on medical management & chronic anticoagulation therapy as directed by cardiologist   No orders of the defined types were placed in this encounter.  All questions were answered. The patient knows to call the clinic with any problems, questions or concerns. No barriers to learning was detected. I spent 25 minutes counseling the patient face to face. The total time spent in the appointment was 30 minutes and more than 50% was on counseling and review of test results     Heath Lark, MD 02/12/2016 9:19 AM

## 2016-02-12 NOTE — Telephone Encounter (Signed)
Per LOS I have scheduled appts and notified the scheduler 

## 2016-02-12 NOTE — Assessment & Plan Note (Signed)
She has chronic atrial fibrillation, s/p ablation and now in sinus rhythm Currently, she is on medical management & chronic anticoagulation therapy as directed by cardiologist

## 2016-02-12 NOTE — Assessment & Plan Note (Signed)
She is taking Arimidex since 07/14/2015 as part of the adjuvant treatment for breast cancer. So far, she tolerated treatment well without any side effects. I will continue to see her on the regular basis including breast examination and will make sure that she get regular screening mammogram, next due January 2018

## 2016-02-16 ENCOUNTER — Ambulatory Visit (HOSPITAL_COMMUNITY)
Admission: RE | Admit: 2016-02-16 | Discharge: 2016-02-16 | Disposition: A | Discharge status: Home / Self Care | Payer: Medicare Other | Source: Ambulatory Visit | Attending: Nurse Practitioner | Admitting: Nurse Practitioner

## 2016-02-16 ENCOUNTER — Encounter (HOSPITAL_COMMUNITY): Payer: Self-pay | Admitting: Nurse Practitioner

## 2016-02-16 VITALS — BP 120/60 | HR 81 | Ht 66.0 in | Wt 145.8 lb

## 2016-02-16 DIAGNOSIS — Z90710 Acquired absence of both cervix and uterus: Secondary | ICD-10-CM | POA: Insufficient documentation

## 2016-02-16 DIAGNOSIS — Z79899 Other long term (current) drug therapy: Secondary | ICD-10-CM | POA: Insufficient documentation

## 2016-02-16 DIAGNOSIS — N189 Chronic kidney disease, unspecified: Secondary | ICD-10-CM | POA: Diagnosis not present

## 2016-02-16 DIAGNOSIS — Z8679 Personal history of other diseases of the circulatory system: Secondary | ICD-10-CM

## 2016-02-16 DIAGNOSIS — Z5189 Encounter for other specified aftercare: Secondary | ICD-10-CM | POA: Diagnosis not present

## 2016-02-16 DIAGNOSIS — Z9889 Other specified postprocedural states: Secondary | ICD-10-CM | POA: Diagnosis not present

## 2016-02-16 DIAGNOSIS — I481 Persistent atrial fibrillation: Secondary | ICD-10-CM | POA: Insufficient documentation

## 2016-02-16 DIAGNOSIS — E1122 Type 2 diabetes mellitus with diabetic chronic kidney disease: Secondary | ICD-10-CM | POA: Insufficient documentation

## 2016-02-16 DIAGNOSIS — Z9049 Acquired absence of other specified parts of digestive tract: Secondary | ICD-10-CM | POA: Insufficient documentation

## 2016-02-16 DIAGNOSIS — R0602 Shortness of breath: Secondary | ICD-10-CM | POA: Diagnosis not present

## 2016-02-16 DIAGNOSIS — I4891 Unspecified atrial fibrillation: Secondary | ICD-10-CM | POA: Diagnosis not present

## 2016-02-16 DIAGNOSIS — Z7984 Long term (current) use of oral hypoglycemic drugs: Secondary | ICD-10-CM | POA: Insufficient documentation

## 2016-02-16 DIAGNOSIS — I129 Hypertensive chronic kidney disease with stage 1 through stage 4 chronic kidney disease, or unspecified chronic kidney disease: Secondary | ICD-10-CM | POA: Diagnosis not present

## 2016-02-16 NOTE — Progress Notes (Signed)
Patient ID: Patty [name], female   DOB: [birth year], 75 y.o.   MRN: [id number]     Primary Care Physician: Horatio Pel, MD Referring Physician: Dr. Mike Gip Patty [name] is a 75 y.o. female with a h/o afib that is s/p ablation 6/20, that is in the afib clinic for f/u. She reports no afib. Still feels that her exercise tolerance is not what she would hope for and that she still has shortness of breath on exertion. She tries to use an exercise bike as tolerated.  Today, she denies symptoms of palpitations, chest pain, shortness of breath, orthopnea, PND, lower extremity edema, dizziness, presyncope, syncope, or neurologic sequela. The patient is tolerating medications without difficulties and is otherwise without complaint today.   Past Medical History:  Diagnosis Date  . Arthritis   . Asthma   . Breast cancer, right ([place]) 2/17   R sided, s/p total mastectomy  . Chronic kidney disease    recurring UTI  . Complication of anesthesia    "morphine made me stop breathing"  . HCAP (healthcare-associated pneumonia) 12/2013   Archie Endo 01/18/2014  . History of skin cancer   . History of transfusion   . Hypertension   . Hypothyroidism   . Left atrial enlargement   . 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 Nocturia   . Persistent atrial fibrillation ([place])   . Shortness of breath dyspnea    "at times"  . Skin cancer   . Sleep apnea    uses c-pap  . Type II diabetes mellitus ([place])    Past Surgical History:  Procedure Laterality Date  . ABDOMINAL HYSTERECTOMY  1972   partial  . APPENDECTOMY  1953  . ATRIAL FIBRILLATION ABLATION  08/19/2015   PVI with CTI ablation by Dr Rayann Heman  . BACK SURGERY     x2  . CARDIAC CATHETERIZATION  10/10/2008   Nonobstructive CAD  . CARDIOVERSION  04/13/2011   Procedure: CARDIOVERSION;  Surgeon: Laverda Page, MD;  Location: [facility];  Service: Cardiovascular;  Laterality: N/A;  . CARDIOVERSION N/A 06/18/2015   Procedure:  CARDIOVERSION;  Surgeon: Adrian Prows, MD;  Location: [facility];  Service: Cardiovascular;  Laterality: N/A;  . CATARACT EXTRACTION W/ INTRAOCULAR LENS  IMPLANT, BILATERAL Bilateral   . CHOLECYSTECTOMY  1993  . ELECTROPHYSIOLOGIC STUDY N/A 08/19/2015   Procedure: Atrial Fibrillation Ablation;  Surgeon: Thompson Grayer, MD;  Location: [facility] CV LAB;  Service: Cardiovascular;  Laterality: N/A;  . FOOT SURGERY    . LUMBAR LAMINECTOMY    . MASS BIOPSY Left 12/07/2013   Procedure: EXCISIONAL BIOPSY LEFT NECK MASS ;  Surgeon: Jerrell Belfast, MD;  Location: [facility];  Service: ENT;  Laterality: Left;  Marland Kitchen MASTECTOMY    . PORTACATH PLACEMENT  12/2013   Archie Endo 01/04/2014  . PUBOVAGINAL SLING    . SKIN TAG REMOVAL  2012   leg  . TEE WITHOUT CARDIOVERSION N/A 08/19/2015   Procedure: TRANSESOPHAGEAL ECHOCARDIOGRAM (TEE);  Surgeon: Larey Dresser, MD;  Location: [facility];  Service: Cardiovascular;  Laterality: N/A;  . TONSILLECTOMY    . TOTAL MASTECTOMY Right 05/27/2015   Procedure: RIGHT TOTAL MASTECTOMY;  Surgeon: Fanny Skates, MD;  Location: [facility];  Service: General;  Laterality: Right;    Current Outpatient Prescriptions  Medication Sig Dispense Refill  . acetaminophen (TYLENOL) 500 MG tablet Take 1,000 mg by mouth every 6 (six) hours as needed for moderate pain, fever or headache.    Marland Kitchen  albuterol (PROVENTIL HFA;VENTOLIN HFA) 108 (90 Base) MCG/ACT inhaler Inhale into the lungs as directed.    Marland Kitchen albuterol (PROVENTIL) (2.5 MG/3ML) 0.083% nebulizer solution Inhale 3 mLs into the lungs every 6 (six) hours as needed for wheezing or shortness of breath.     . anastrozole (ARIMIDEX) 1 MG tablet Take 1 tablet (1 mg total) by mouth daily. 90 tablet 3  . Cholecalciferol (VITAMIN D) 2000 UNITS tablet Take 2,000 Units by mouth daily with lunch.    . diclofenac sodium (VOLTAREN) 1 % GEL Apply 1 application topically 4 (four) times daily as needed.    . diltiazem (CARDIZEM CD) 120 MG 24 hr capsule Take 1  capsule (120 mg total) by mouth daily. 30 capsule 6  . Fluticasone-Salmeterol (ADVAIR) 250-50 MCG/DOSE AEPB Inhale 2 puffs into the lungs every 12 (twelve) hours.     Marland Kitchen levothyroxine (SYNTHROID, LEVOTHROID) 200 MCG tablet Take 200 mcg by mouth daily before breakfast.    . lidocaine-prilocaine (EMLA) cream Apply 1 application topically as needed. Apply to [facility] a Cath site at least one hour prior to needle stick as needed. 30 g 10  . Magnesium Oxide 500 MG TABS Take 500 mg by mouth daily.     . metFORMIN (GLUCOPHAGE-XR) 500 MG 24 hr tablet Take 500 mg by mouth 2 (two) times daily with a meal.    . montelukast (SINGULAIR) 10 MG tablet Take 10 mg by mouth daily.    . Omega-3-acid Ethyl Esters (LOVAZA PO) Take 1 capsule by mouth 2 (two) times daily.    . pravastatin (PRAVACHOL) 40 MG tablet Take 20 mg by mouth See admin instructions. Take 1/2 tablet (20 mg) ONLY on Monday, Tuesday, Wednesday, and Thursday Nights.    . rivaroxaban (XARELTO) 20 MG TABS tablet Take 20 mg by mouth daily with supper.    . traMADol-acetaminophen (ULTRACET) 37.5-325 MG tablet Take 1 tablet by mouth every 6 (six) hours as needed (Pain).    . vitamin B-12 (CYANOCOBALAMIN) 500 MCG tablet Take 500 mcg by mouth daily.     No current facility-administered medications for this encounter.     Allergies  Allergen Reactions  . Morphine And Related Anaphylaxis and Other (See Comments)    Pt states she stopped breathing post op- "went into resp. arrest"  . Multaq [Dronedarone] Other (See Comments)    Reaction:  Blood in urine and elevated liver enzymes.   . Demerol Nausea And Vomiting  . Oysters [Shellfish Allergy] Rash    & CLAMS  . Penicillins Itching and Rash    Has patient had a PCN reaction causing immediate rash, facial/tongue/throat swelling, SOB or lightheadedness with hypotension: No Has patient had a PCN reaction causing severe rash involving mucus membranes or skin necrosis: Yes Has patient had a PCN reaction that  required hospitalization No Has patient had a PCN reaction occurring within the last 10 years: No If all of the above answers are "NO", then may proceed with Cephalosporin use.   [facility] Cross Reactors Rash    Social History   Social History  . Marital status: Married    Spouse name: N/A  . Number of children: 2  . Years of education: N/A   Occupational History  . Not on file.   Social History Main Topics  . Smoking status: Never Smoker  . Smokeless tobacco: Never Used  . Alcohol use No  . Drug use: No  . Sexual activity: Not Currently    Birth control/ protection: Surgical  Other Topics Concern  . Not on file   Social History Narrative   Pt lives in [place] with spouse.   Retired Therapist, sports.  Previously worked in [employer].    Family History  Problem Relation Age of Onset  . Stroke Father 21  . Heart attack Father 30  . Hypertension Father   . Heart disease Mother   . Arthritis Mother   . Breast cancer Mother 63  . Heart failure Mother   . Heart attack Brother   . Hypertension Brother   . Diabetes Sister     ROS- All systems are reviewed and negative except as per the HPI above  Physical Exam: Vitals:   02/16/16 1326  BP: 120/60  Pulse: 81  Weight: 145 lb 12.8 oz (66.1 kg)  Height: 5\' 6"  (1.676 m)    GEN- The patient is well appearing, alert and oriented x 3 today.   Head- normocephalic, atraumatic Eyes-  Sclera clear, conjunctiva pink Ears- hearing intact Oropharynx- clear Neck- supple, no JVP Lymph- no cervical lymphadenopathy Lungs- Clear to ausculation bilaterally, normal work of breathing Heart- Regular rate and rhythm, no murmurs, rubs or gallops, PMI not laterally displaced GI- soft, NT, ND, + BS Extremities- no clubbing, cyanosis, or edema MS- no significant deformity or atrophy Skin- no rash or lesion Psych- euthymic mood, full affect Neuro- strength and sensation are intact  EKG- SR with first degree AV block, LAFB, pr int 262  ms, qrs int 90 ms, qtc 446 ms Epic records reviewed  Assessment and Plan: 1. Afib s/p ablation Doing well in SR  Off flecainide at time of ablation Continue diltiazem Continue xarelto   afib clinic in 3 months   Butch Penny C. Dezmen Alcock, [facility] [address] [place], [place] [address] [phone]

## 2016-03-08 DIAGNOSIS — Z961 Presence of intraocular lens: Secondary | ICD-10-CM | POA: Diagnosis not present

## 2016-03-08 DIAGNOSIS — E119 Type 2 diabetes mellitus without complications: Secondary | ICD-10-CM | POA: Diagnosis not present

## 2016-03-08 DIAGNOSIS — H5201 Hypermetropia, right eye: Secondary | ICD-10-CM | POA: Diagnosis not present

## 2016-03-09 ENCOUNTER — Other Ambulatory Visit: Payer: Self-pay | Admitting: Gynecology

## 2016-03-09 DIAGNOSIS — Z853 Personal history of malignant neoplasm of breast: Secondary | ICD-10-CM

## 2016-03-11 ENCOUNTER — Other Ambulatory Visit (HOSPITAL_BASED_OUTPATIENT_CLINIC_OR_DEPARTMENT_OTHER): Payer: Medicare Other

## 2016-03-11 ENCOUNTER — Ambulatory Visit: Payer: Medicare Other

## 2016-03-11 ENCOUNTER — Ambulatory Visit (HOSPITAL_BASED_OUTPATIENT_CLINIC_OR_DEPARTMENT_OTHER): Payer: Medicare Other

## 2016-03-11 VITALS — BP 139/96 | HR 89 | Temp 98.3°F | Resp 18

## 2016-03-11 DIAGNOSIS — C8591 Non-Hodgkin lymphoma, unspecified, lymph nodes of head, face, and neck: Secondary | ICD-10-CM

## 2016-03-11 DIAGNOSIS — C50211 Malignant neoplasm of upper-inner quadrant of right female breast: Secondary | ICD-10-CM

## 2016-03-11 DIAGNOSIS — D801 Nonfamilial hypogammaglobulinemia: Secondary | ICD-10-CM

## 2016-03-11 LAB — CBC WITH DIFFERENTIAL/PLATELET
BASO%: 0.3 % (ref 0.0–2.0)
Basophils Absolute: 0 10*3/uL (ref 0.0–0.1)
EOS%: 1.5 % (ref 0.0–7.0)
Eosinophils Absolute: 0.1 10*3/uL (ref 0.0–0.5)
HEMATOCRIT: 30 % — AB (ref 34.8–46.6)
HGB: 9.7 g/dL — ABNORMAL LOW (ref 11.6–15.9)
LYMPH#: 1.4 10*3/uL (ref 0.9–3.3)
LYMPH%: 18.8 % (ref 14.0–49.7)
MCH: 27.6 pg (ref 25.1–34.0)
MCHC: 32.3 g/dL (ref 31.5–36.0)
MCV: 85.5 fL (ref 79.5–101.0)
MONO#: 0.7 10*3/uL (ref 0.1–0.9)
MONO%: 10.3 % (ref 0.0–14.0)
NEUT#: 5 10*3/uL (ref 1.5–6.5)
NEUT%: 69.1 % (ref 38.4–76.8)
Platelets: 248 10*3/uL (ref 145–400)
RBC: 3.51 10*6/uL — AB (ref 3.70–5.45)
RDW: 15.7 % — ABNORMAL HIGH (ref 11.2–14.5)
WBC: 7.2 10*3/uL (ref 3.9–10.3)

## 2016-03-11 MED ORDER — DEXAMETHASONE SODIUM PHOSPHATE 10 MG/ML IJ SOLN
INTRAMUSCULAR | Status: AC
  Filled 2016-03-11: qty 1

## 2016-03-11 MED ORDER — SODIUM CHLORIDE 0.9% FLUSH
10.0000 mL | INTRAVENOUS | Status: DC | PRN
  Administered 2016-03-11: 10 mL via INTRAVENOUS
  Filled 2016-03-11: qty 10

## 2016-03-11 MED ORDER — HEPARIN SOD (PORK) LOCK FLUSH 100 UNIT/ML IV SOLN
500.0000 [IU] | Freq: Once | INTRAVENOUS | Status: AC
  Administered 2016-03-11: 500 [IU] via INTRAVENOUS
  Filled 2016-03-11: qty 5

## 2016-03-11 MED ORDER — DIPHENHYDRAMINE HCL 25 MG PO CAPS
25.0000 mg | ORAL_CAPSULE | Freq: Once | ORAL | Status: AC
  Administered 2016-03-11: 25 mg via ORAL

## 2016-03-11 MED ORDER — DIPHENHYDRAMINE HCL 25 MG PO CAPS
ORAL_CAPSULE | ORAL | Status: AC
  Filled 2016-03-11: qty 1

## 2016-03-11 MED ORDER — ALTEPLASE 2 MG IJ SOLR
2.0000 mg | Freq: Once | INTRAMUSCULAR | Status: DC | PRN
  Filled 2016-03-11: qty 2

## 2016-03-11 MED ORDER — IMMUNE GLOBULIN (HUMAN) 10 GM/100ML IV SOLN
25.0000 g | Freq: Once | INTRAVENOUS | Status: AC
  Administered 2016-03-11: 25 g via INTRAVENOUS
  Filled 2016-03-11: qty 200

## 2016-03-11 MED ORDER — SODIUM CHLORIDE 0.9 % IJ SOLN
10.0000 mL | INTRAMUSCULAR | Status: DC | PRN
  Administered 2016-03-11: 10 mL via INTRAVENOUS
  Filled 2016-03-11: qty 10

## 2016-03-11 MED ORDER — DEXAMETHASONE SODIUM PHOSPHATE 10 MG/ML IJ SOLN
10.0000 mg | Freq: Once | INTRAMUSCULAR | Status: AC
  Administered 2016-03-11: 10 mg via INTRAVENOUS

## 2016-03-11 NOTE — Patient Instructions (Signed)

## 2016-03-12 DIAGNOSIS — I480 Paroxysmal atrial fibrillation: Secondary | ICD-10-CM | POA: Diagnosis not present

## 2016-03-12 DIAGNOSIS — I10 Essential (primary) hypertension: Secondary | ICD-10-CM | POA: Diagnosis not present

## 2016-03-12 DIAGNOSIS — I440 Atrioventricular block, first degree: Secondary | ICD-10-CM | POA: Diagnosis not present

## 2016-03-12 DIAGNOSIS — Z9889 Other specified postprocedural states: Secondary | ICD-10-CM | POA: Diagnosis not present

## 2016-03-24 DIAGNOSIS — H02831 Dermatochalasis of right upper eyelid: Secondary | ICD-10-CM | POA: Diagnosis not present

## 2016-03-24 DIAGNOSIS — H02835 Dermatochalasis of left lower eyelid: Secondary | ICD-10-CM | POA: Diagnosis not present

## 2016-03-25 DIAGNOSIS — E1142 Type 2 diabetes mellitus with diabetic polyneuropathy: Secondary | ICD-10-CM | POA: Diagnosis not present

## 2016-03-25 DIAGNOSIS — M545 Low back pain: Secondary | ICD-10-CM | POA: Diagnosis not present

## 2016-03-25 DIAGNOSIS — R29898 Other symptoms and signs involving the musculoskeletal system: Secondary | ICD-10-CM | POA: Diagnosis not present

## 2016-03-25 DIAGNOSIS — M1288 Other specific arthropathies, not elsewhere classified, other specified site: Secondary | ICD-10-CM | POA: Diagnosis not present

## 2016-04-01 ENCOUNTER — Other Ambulatory Visit: Payer: Self-pay | Admitting: Gynecology

## 2016-04-01 ENCOUNTER — Ambulatory Visit
Admission: RE | Admit: 2016-04-01 | Discharge: 2016-04-01 | Disposition: A | Discharge status: Home / Self Care | Payer: Medicare Other | Source: Ambulatory Visit | Attending: Gynecology | Admitting: Gynecology

## 2016-04-01 DIAGNOSIS — R928 Other abnormal and inconclusive findings on diagnostic imaging of breast: Secondary | ICD-10-CM | POA: Diagnosis not present

## 2016-04-01 DIAGNOSIS — Z853 Personal history of malignant neoplasm of breast: Secondary | ICD-10-CM

## 2016-04-08 ENCOUNTER — Other Ambulatory Visit: Payer: Self-pay | Admitting: Hematology and Oncology

## 2016-04-08 ENCOUNTER — Ambulatory Visit (HOSPITAL_BASED_OUTPATIENT_CLINIC_OR_DEPARTMENT_OTHER): Payer: Medicare Other | Admitting: Hematology and Oncology

## 2016-04-08 ENCOUNTER — Ambulatory Visit: Payer: Medicare Other

## 2016-04-08 ENCOUNTER — Telehealth: Payer: Self-pay | Admitting: *Deleted

## 2016-04-08 ENCOUNTER — Encounter: Payer: Self-pay | Admitting: Hematology and Oncology

## 2016-04-08 ENCOUNTER — Telehealth: Payer: Self-pay | Admitting: Hematology and Oncology

## 2016-04-08 ENCOUNTER — Ambulatory Visit (HOSPITAL_BASED_OUTPATIENT_CLINIC_OR_DEPARTMENT_OTHER): Payer: Medicare Other

## 2016-04-08 ENCOUNTER — Other Ambulatory Visit (HOSPITAL_BASED_OUTPATIENT_CLINIC_OR_DEPARTMENT_OTHER): Payer: Medicare Other

## 2016-04-08 VITALS — BP 123/53 | HR 94 | Temp 98.0°F | Resp 17 | Ht 66.0 in | Wt 145.8 lb

## 2016-04-08 DIAGNOSIS — D801 Nonfamilial hypogammaglobulinemia: Secondary | ICD-10-CM

## 2016-04-08 DIAGNOSIS — N390 Urinary tract infection, site not specified: Secondary | ICD-10-CM

## 2016-04-08 DIAGNOSIS — C8591 Non-Hodgkin lymphoma, unspecified, lymph nodes of head, face, and neck: Secondary | ICD-10-CM

## 2016-04-08 DIAGNOSIS — C50211 Malignant neoplasm of upper-inner quadrant of right female breast: Secondary | ICD-10-CM

## 2016-04-08 DIAGNOSIS — Z170 Estrogen receptor positive status [ER+]: Secondary | ICD-10-CM

## 2016-04-08 DIAGNOSIS — R635 Abnormal weight gain: Secondary | ICD-10-CM | POA: Insufficient documentation

## 2016-04-08 DIAGNOSIS — D638 Anemia in other chronic diseases classified elsewhere: Secondary | ICD-10-CM

## 2016-04-08 LAB — URINALYSIS, MICROSCOPIC - CHCC
BLOOD: NEGATIVE
Bilirubin (Urine): NEGATIVE
GLUCOSE UR CHCC: NEGATIVE mg/dL
KETONES: NEGATIVE mg/dL
Nitrite: NEGATIVE
Protein: NEGATIVE mg/dL
RBC / HPF: NEGATIVE (ref 0–2)
SPECIFIC GRAVITY, URINE: 1.01 (ref 1.003–1.035)
UROBILINOGEN UR: 0.2 mg/dL (ref 0.2–1)
pH: 6 (ref 4.6–8.0)

## 2016-04-08 LAB — CBC WITH DIFFERENTIAL/PLATELET
BASO%: 0.4 % (ref 0.0–2.0)
Basophils Absolute: 0 10*3/uL (ref 0.0–0.1)
EOS%: 1.3 % (ref 0.0–7.0)
Eosinophils Absolute: 0.1 10*3/uL (ref 0.0–0.5)
HCT: 28.6 % — ABNORMAL LOW (ref 34.8–46.6)
HGB: 9.6 g/dL — ABNORMAL LOW (ref 11.6–15.9)
LYMPH%: 12 % — AB (ref 14.0–49.7)
MCH: 28.5 pg (ref 25.1–34.0)
MCHC: 33.7 g/dL (ref 31.5–36.0)
MCV: 84.5 fL (ref 79.5–101.0)
MONO#: 0.6 10*3/uL (ref 0.1–0.9)
MONO%: 8.9 % (ref 0.0–14.0)
NEUT%: 77.4 % — AB (ref 38.4–76.8)
NEUTROS ABS: 5 10*3/uL (ref 1.5–6.5)
Platelets: 229 10*3/uL (ref 145–400)
RBC: 3.39 10*6/uL — AB (ref 3.70–5.45)
RDW: 16.9 % — ABNORMAL HIGH (ref 11.2–14.5)
WBC: 6.4 10*3/uL (ref 3.9–10.3)
lymph#: 0.8 10*3/uL — ABNORMAL LOW (ref 0.9–3.3)

## 2016-04-08 MED ORDER — DIPHENHYDRAMINE HCL 25 MG PO CAPS
ORAL_CAPSULE | ORAL | Status: AC
  Filled 2016-04-08: qty 1

## 2016-04-08 MED ORDER — ONDANSETRON HCL 8 MG PO TABS: 8.0000 mg | ORAL_TABLET | Freq: Three times a day (TID) | ORAL | 3 refills | Status: DC | PRN

## 2016-04-08 MED ORDER — DEXAMETHASONE SODIUM PHOSPHATE 10 MG/ML IJ SOLN
INTRAMUSCULAR | Status: AC
  Filled 2016-04-08: qty 1

## 2016-04-08 MED ORDER — HEPARIN SOD (PORK) LOCK FLUSH 100 UNIT/ML IV SOLN
500.0000 [IU] | Freq: Once | INTRAVENOUS | Status: AC | PRN
  Administered 2016-04-08: 500 [IU] via INTRAVENOUS
  Filled 2016-04-08: qty 5

## 2016-04-08 MED ORDER — SODIUM CHLORIDE 0.9 % IJ SOLN
10.0000 mL | INTRAMUSCULAR | Status: DC | PRN
  Administered 2016-04-08: 10 mL via INTRAVENOUS
  Filled 2016-04-08: qty 10

## 2016-04-08 MED ORDER — DEXAMETHASONE SODIUM PHOSPHATE 10 MG/ML IJ SOLN
5.0000 mg | Freq: Once | INTRAMUSCULAR | Status: AC
  Administered 2016-04-08: 5 mg via INTRAVENOUS

## 2016-04-08 MED ORDER — DIPHENHYDRAMINE HCL 25 MG PO CAPS
25.0000 mg | ORAL_CAPSULE | Freq: Once | ORAL | Status: AC
  Administered 2016-04-08: 25 mg via ORAL

## 2016-04-08 MED ORDER — CIPROFLOXACIN HCL 250 MG PO TABS: 250.0000 mg | ORAL_TABLET | Freq: Two times a day (BID) | ORAL | 0 refills | Status: DC

## 2016-04-08 MED ORDER — IMMUNE GLOBULIN (HUMAN) 10 GM/100ML IV SOLN
400.0000 mg/kg | Freq: Once | INTRAVENOUS | Status: AC
  Administered 2016-04-08: 25 g via INTRAVENOUS
  Filled 2016-04-08: qty 50

## 2016-04-08 NOTE — Patient Instructions (Signed)

## 2016-04-08 NOTE — Telephone Encounter (Signed)
Message sent to infusion scheduler to be added. Appointments scheduled per 04/08/16 los. Patient was given a copy of the AVS report and appointment schedule per 04/08/16 los.

## 2016-04-08 NOTE — Progress Notes (Signed)
[facility] OFFICE PROGRESS NOTE  Patient Care Team: Deland Pretty, MD as PCP - General (Internal Medicine) Adrian Prows, MD as Consulting Physician (Cardiology) Heath Lark, MD as Consulting Physician (Hematology and Oncology) Fanny Skates, MD as Consulting Physician (General Surgery) Eppie Gibson, MD as Attending Physician (Radiation Oncology) Sylvan Cheese, NP as Nurse Practitioner (Hematology and Oncology) Thompson Grayer, MD as Consulting Physician (Cardiology) Lahoma Rocker, MD as Consulting Physician (Rheumatology) Terrance Mass, MD as Consulting Physician (Gynecology)  SUMMARY OF ONCOLOGIC HISTORY: Oncology History   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      01/03/2014 Bone Marrow Biopsy    Accession: [id number] Bone marrow biopsy is negative      01/04/2014 Imaging    ECHO showed normal EF      01/04/2014 Procedure    She has placement of port      01/09/2014 - 03/07/2014 Chemotherapy    She was given treatment with bendamustine with rituximab. Treatment was stopped due to severe side-effects despite significant dose adjustment for cycle 2      01/18/2014 - 02/01/2014 Hospital Admission    She was admitted to the hospital from Escherichia coli sepsis with multiorgan failure and brief episodes of  intubation. She was discharged to skilled nursing facility      02/26/2014 Imaging    PET/CT scan showed near complete remission      02/27/2014 Adverse Reaction    Cycle 2 of treatment was resumed with drastic dose adjustment to bendamustine due to recent multi-organ failure      04/03/2014 - 03/13/2015 Chemotherapy    She is started on maintenance rituximab only.      04/09/2014 - 04/12/2014 Hospital Admission    The patient was admitted to the hospital with urinary tract infection and sepsis.      06/05/2014 Imaging    PET CT scan showed complete response to Rx      02/12/2015 Imaging    Ct scan showed no evidence of disease. It shows she has new pneumonia      04/02/2015 Miscellaneous    She received 1 dose IVIG complicated by mild infusion reaction      04/26/2015 - 04/30/2015 Hospital Admission    She had recurrent admission to the hospital with sepsis      10/22/2015 Imaging    CT scan of chest and abdomen showed Ill-defined tissue in the porta hepatis suggest treated lymphoma. No evidence of lymphadenopathy in the chest, abdomen & pelvis to suggest recurrent lymphoma.       Breast cancer of upper-inner quadrant of right female breast ([place])   11/06/2014 Imaging    DEXA scan showed osteopenia T-1.9 on bilateral femoral neck  03/31/2015 Imaging    Screening mammogram showed suspicious lesion, confirmed on diagnostic imaging at 2 and 230 position on the right breast      04/10/2015 Pathology Results    Accession: [id number] breast biopsy in 2 locations came back invasive ductal carcinoma with calcification, 100% ER and PR positive, HER 2 neg      04/10/2015 Clinical Stage    Stage IA: T1 N0      05/27/2015 Surgery    Right total mastectomy: multifocal IDC, 1.5 and 1.0 cm, neg margins, ER+ (100% - both); PR+ (100% and 95%), HER2neu negative (ratio 1.69 and 1.14) Ki67 2% and 10%. DCIS      05/27/2015 Pathologic Stage    Stage IA: mpT1c pNx pMx      06/17/2015  Survivorship    Survivorship care plan mailed to patient at her request      07/14/2015 -  Anti-estrogen oral therapy    She started taking Arimidex      04/01/2016 Imaging    No mammographic evidence of malignancy in the left breast.       INTERVAL HISTORY: Please see below for problem oriented charting. She returns for her monthly IVIG treatment. She denies recent infection. She had recent nausea. Denies constipation. The patient denies any recent signs or symptoms of bleeding such as spontaneous epistaxis, hematuria or hematochezia. She denies any recent abnormal breast examination, palpable mass, abnormal breast appearance or nipple changes She denies recent lymphadenopathy.  REVIEW OF SYSTEMS:   Constitutional: Denies fevers, chills or abnormal weight loss Eyes: Denies blurriness of vision Ears, nose, mouth, throat, and face: Denies mucositis or sore throat Respiratory: Denies cough, dyspnea or wheezes Cardiovascular: Denies palpitation, chest discomfort or lower extremity swelling Skin: Denies abnormal skin rashes Lymphatics: Denies new lymphadenopathy or easy bruising Neurological:Denies numbness, tingling or new weaknesses Behavioral/Psych: Mood is stable, no new changes  All other systems were reviewed with the patient and are negative.  I have reviewed the past medical history, past surgical history, social history and family history with the patient and they are unchanged from previous note.  ALLERGIES:  is allergic to morphine and related; multaq [dronedarone]; demerol; oysters [shellfish allergy]; penicillins; and sulfa drugs cross reactors.  MEDICATIONS:  Current Outpatient Prescriptions  Medication Sig Dispense Refill  . acetaminophen (TYLENOL) 500 MG tablet Take 1,000 mg by mouth every 6 (six) hours as needed for moderate pain, fever or headache.    . albuterol (PROVENTIL HFA;VENTOLIN HFA) 108 (90 Base) MCG/ACT inhaler Inhale into the lungs as directed.    Marland Kitchen  anastrozole (ARIMIDEX) 1 MG tablet Take 1 tablet (1 mg total) by mouth daily. 90 tablet 3  . Cholecalciferol (VITAMIN D) 2000 UNITS tablet Take 2,000 Units by mouth daily with lunch.    . diclofenac sodium (VOLTAREN) 1 % GEL Apply 1 application topically 4 (four) times daily as needed.    . diltiazem (CARDIZEM CD) 120 MG 24 hr capsule Take 1 capsule (120 mg total) by mouth daily. 30 capsule 6  . Fluticasone-Salmeterol (ADVAIR) 250-50 MCG/DOSE AEPB Inhale 2 puffs into the lungs every 12 (twelve) hours.     Marland Kitchen levothyroxine (SYNTHROID, LEVOTHROID) 200 MCG tablet Take 200 mcg by mouth daily before breakfast.    . lidocaine-prilocaine (EMLA) cream Apply 1 application topically as needed. Apply to [facility] a Cath site at least one hour prior to needle stick as needed. 30 g 10  . Magnesium Oxide 500 MG TABS Take 500 mg by mouth daily.     Marland Kitchen  metFORMIN (GLUCOPHAGE-XR) 500 MG 24 hr tablet Take 500 mg by mouth 2 (two) times daily with a meal.    . montelukast (SINGULAIR) 10 MG tablet Take 10 mg by mouth daily.    . Omega-3-acid Ethyl Esters (LOVAZA PO) Take 1 capsule by mouth 2 (two) times daily.    . pravastatin (PRAVACHOL) 20 MG tablet 1/2 tab mon-thurs    . rivaroxaban (XARELTO) 20 MG TABS tablet Take 20 mg by mouth daily with supper.    . traMADol-acetaminophen (ULTRACET) 37.5-325 MG tablet Take 1 tablet by mouth every 6 (six) hours as needed (Pain).    . vitamin B-12 (CYANOCOBALAMIN) 500 MCG tablet Take 500 mcg by mouth daily.    Marland Kitchen albuterol (PROVENTIL) (2.5 MG/3ML) 0.083% nebulizer solution Inhale 3 mLs into the lungs every 6 (six) hours as needed for wheezing or shortness of breath.     . hydrochlorothiazide (HYDRODIURIL) 25 MG tablet Take 37.5 mg by mouth daily as needed (as needed for leg swelling).    . ondansetron (ZOFRAN) 8 MG tablet Take 1 tablet (8 mg total) by mouth every 8 (eight) hours as needed for nausea. 60 tablet 3   No current facility-administered medications for this visit.      PHYSICAL EXAMINATION: ECOG PERFORMANCE STATUS: 1 - Symptomatic but completely ambulatory  Vitals:   04/08/16 0950  BP: (!) 123/53  Pulse: 94  Resp: 17  Temp: 98 F (36.7 C)   Filed Weights   04/08/16 0950  Weight: 145 lb 12.8 oz (66.1 kg)    GENERAL:alert, no distress and comfortable SKIN: skin color, texture, turgor are normal, no rashes or significant lesions EYES: normal, Conjunctiva are pink and non-injected, sclera clear OROPHARYNX:no exudate, no erythema and lips, buccal mucosa, and tongue normal  NECK: supple, thyroid normal size, non-tender, without nodularity LYMPH:  no palpable lymphadenopathy in the cervical, axillary or inguinal LUNGS: clear to auscultation and percussion with normal breathing effort HEART: regular rate & rhythm and no murmurs and no lower extremity edema ABDOMEN:abdomen soft, non-tender and normal bowel sounds Musculoskeletal:no cyanosis of digits and no clubbing  NEURO: alert & oriented x 3 with fluent speech, no focal motor/sensory deficits Right chest wall examination showed no evidence of disease recurrence. Normal breast exam on the left  LABORATORY DATA:  I have reviewed the data as listed    Component Value Date/Time   NA 140 10/22/2015 1220   K 4.0 10/22/2015 1220   CL 108 08/20/2015 0430   CO2 25 10/22/2015 1220   GLUCOSE 128 10/22/2015 1220   BUN 18.0 10/22/2015 1220   CREATININE 0.8 10/22/2015 1220   CALCIUM 10.0 10/22/2015 1220   PROT 7.1 10/22/2015 1220   ALBUMIN 4.0 10/22/2015 1220   AST 16 10/22/2015 1220   ALT 13 10/22/2015 1220   ALKPHOS 69 10/22/2015 1220   BILITOT 0.42 10/22/2015 1220   GFRNONAA >60 08/20/2015 0430   GFRAA >60 08/20/2015 0430    No results found for: SPEP, UPEP  Lab Results  Component Value Date   WBC 6.4 04/08/2016   NEUTROABS 5.0 04/08/2016   HGB 9.6 (L) 04/08/2016   HCT 28.6 (L) 04/08/2016   MCV 84.5 04/08/2016   PLT 229 04/08/2016      Chemistry      Component Value Date/Time    NA 140 10/22/2015 1220   K 4.0 10/22/2015 1220   CL 108 08/20/2015 0430   CO2 25 10/22/2015 1220   BUN 18.0 10/22/2015 1220   CREATININE 0.8 10/22/2015  1220      Component Value Date/Time   CALCIUM 10.0 10/22/2015 1220   ALKPHOS 69 10/22/2015 1220   AST 16 10/22/2015 1220   ALT 13 10/22/2015 1220   BILITOT 0.42 10/22/2015 1220       RADIOGRAPHIC STUDIES: I have personally reviewed the radiological images as listed and agreed with the findings in the report. Mm Diag Breast Tomo Uni Left  Result Date: 04/01/2016 CLINICAL DATA:  76 year old female with history of right breast cancer post mastectomy 05/27/2015. No problems with the left breast. EXAM: 2D DIGITAL DIAGNOSTIC UNILATERAL LEFT MAMMOGRAM WITH CAD AND ADJUNCT TOMO COMPARISON:  Previous exam(s). ACR Breast Density Category b: There are scattered areas of fibroglandular density. FINDINGS: No suspicious masses or calcifications are seen in the left breast. There is no mammographic evidence of malignancy in the left breast. Mammographic images were processed with CAD. IMPRESSION: No mammographic evidence of malignancy in the left breast. RECOMMENDATION: Screening mammogram in one year.(Code:SM-B-01Y) I have discussed the findings and recommendations with the patient. Results were also provided in writing at the conclusion of the visit. If applicable, a reminder letter will be sent to the patient regarding the next appointment. BI-RADS CATEGORY  1: Negative. Electronically Signed   By: Everlean Alstrom M.D.   On: 04/01/2016 10:46    ASSESSMENT & PLAN:  Breast cancer of upper-inner quadrant of right female breast [facility]) She is taking Arimidex since 07/14/2015 as part of the adjuvant treatment for breast cancer. So far, she tolerated treatment well without any side effects. I will continue to see her on the regular basis including breast examination and will make sure that she get regular screening mammogram Her recent mammogram a week  ago was normal. 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 I recommend IVIG monthly and I'll see her back in 2 months I plan to recheck immunoglobulin level in March  Anemia in chronic illness This is likely due to recent treatment and anemia of chronic disease. The patient denies recent history of bleeding such as epistaxis, hematuria or hematochezia. She is asymptomatic from the anemia. I will observe for now.     UTI (urinary tract infection) She had calmed plane of recurrent UTI in the past. She had recent nausea that cannot be explained I refill her prescription anti-emetics but will plan to recheck urinalysis and urine culture to exclude infection  Weight gain She has recent weight gain. I plan to reduce her monthly dexamethasone   Orders Placed This Encounter  Procedures  . Urine culture    Standing Status:   Future    Number of Occurrences:   1    Standing Expiration Date:   05/13/2017  . Urinalysis, Microscopic - [facility]    Standing Status:   Future    Number of Occurrences:   1    Standing Expiration Date:   05/13/2017  . IgG    Standing Status:   Future    Standing Expiration Date:   04/08/2017  . Comprehensive metabolic panel    Standing Status:   Standing    Number of Occurrences:   22    Standing Expiration Date:   04/08/2017   All questions were answered. The patient knows to call the clinic with any  problems, questions or concerns. No barriers to learning was detected. I spent 30 minutes counseling the patient face to face. The total time spent in the appointment was 40 minutes and more than 50% was on counseling and review of test results     Heath Lark, MD 04/08/2016 10:25 AM

## 2016-04-08 NOTE — Assessment & Plan Note (Signed)
This is due to rituximab. I recommend IVIG monthly and I'll see her back in 2 months I plan to recheck immunoglobulin level in March

## 2016-04-08 NOTE — Assessment & Plan Note (Signed)
She is taking Arimidex since 07/14/2015 as part of the adjuvant treatment for breast cancer. So far, she tolerated treatment well without any side effects. I will continue to see her on the regular basis including breast examination and will make sure that she get regular screening mammogram Her recent mammogram a week ago was normal. Her next screening mammogram would be in February 2018

## 2016-04-08 NOTE — Assessment & Plan Note (Signed)
This is likely due to recent treatment and anemia of chronic disease. The patient denies recent history of bleeding such as epistaxis, hematuria or hematochezia. She is asymptomatic from the anemia. I will observe for now.  

## 2016-04-08 NOTE — Assessment & Plan Note (Signed)
She has recent weight gain. I plan to reduce her monthly dexamethasone

## 2016-04-08 NOTE — Assessment & Plan Note (Signed)
She had calmed plane of recurrent UTI in the past. She had recent nausea that cannot be explained I refill her prescription anti-emetics but will plan to recheck urinalysis and urine culture to exclude infection

## 2016-04-08 NOTE — Telephone Encounter (Signed)
Per 2/8 LOS and staff message I have scheduled appts and gave calendar. Notified the scheduler

## 2016-04-08 NOTE — Assessment & Plan Note (Signed)
Repeat imaging study August 2017 showed no evidence of recurrence I will repeat imaging study in 1 yr

## 2016-04-10 LAB — URINE CULTURE

## 2016-04-12 DIAGNOSIS — M1180 Other specified crystal arthropathies, unspecified site: Secondary | ICD-10-CM | POA: Diagnosis not present

## 2016-04-12 DIAGNOSIS — M109 Gout, unspecified: Secondary | ICD-10-CM | POA: Diagnosis not present

## 2016-04-12 DIAGNOSIS — M170 Bilateral primary osteoarthritis of knee: Secondary | ICD-10-CM | POA: Diagnosis not present

## 2016-04-15 DIAGNOSIS — E119 Type 2 diabetes mellitus without complications: Secondary | ICD-10-CM | POA: Diagnosis not present

## 2016-04-15 DIAGNOSIS — Z7901 Long term (current) use of anticoagulants: Secondary | ICD-10-CM | POA: Diagnosis not present

## 2016-04-15 DIAGNOSIS — Z5181 Encounter for therapeutic drug level monitoring: Secondary | ICD-10-CM | POA: Diagnosis not present

## 2016-04-15 DIAGNOSIS — C50411 Malignant neoplasm of upper-outer quadrant of right female breast: Secondary | ICD-10-CM | POA: Diagnosis not present

## 2016-04-15 DIAGNOSIS — C8591 Non-Hodgkin lymphoma, unspecified, lymph nodes of head, face, and neck: Secondary | ICD-10-CM | POA: Diagnosis not present

## 2016-04-15 DIAGNOSIS — J45909 Unspecified asthma, uncomplicated: Secondary | ICD-10-CM | POA: Diagnosis not present

## 2016-04-15 DIAGNOSIS — I4891 Unspecified atrial fibrillation: Secondary | ICD-10-CM | POA: Diagnosis not present

## 2016-04-19 DIAGNOSIS — H02834 Dermatochalasis of left upper eyelid: Secondary | ICD-10-CM | POA: Diagnosis not present

## 2016-04-19 DIAGNOSIS — H02831 Dermatochalasis of right upper eyelid: Secondary | ICD-10-CM | POA: Diagnosis not present

## 2016-04-26 ENCOUNTER — Other Ambulatory Visit: Payer: Self-pay | Admitting: Physician Assistant

## 2016-05-06 ENCOUNTER — Ambulatory Visit: Payer: Medicare Other

## 2016-05-06 ENCOUNTER — Other Ambulatory Visit (HOSPITAL_BASED_OUTPATIENT_CLINIC_OR_DEPARTMENT_OTHER): Payer: Medicare Other

## 2016-05-06 ENCOUNTER — Ambulatory Visit (HOSPITAL_BASED_OUTPATIENT_CLINIC_OR_DEPARTMENT_OTHER): Payer: Medicare Other

## 2016-05-06 VITALS — BP 120/56 | HR 70 | Temp 97.6°F | Resp 19

## 2016-05-06 DIAGNOSIS — C50211 Malignant neoplasm of upper-inner quadrant of right female breast: Secondary | ICD-10-CM

## 2016-05-06 DIAGNOSIS — D801 Nonfamilial hypogammaglobulinemia: Secondary | ICD-10-CM

## 2016-05-06 DIAGNOSIS — C8591 Non-Hodgkin lymphoma, unspecified, lymph nodes of head, face, and neck: Secondary | ICD-10-CM | POA: Diagnosis present

## 2016-05-06 LAB — CBC WITH DIFFERENTIAL/PLATELET
BASO%: 0.6 % (ref 0.0–2.0)
Basophils Absolute: 0 10*3/uL (ref 0.0–0.1)
EOS ABS: 0.2 10*3/uL (ref 0.0–0.5)
EOS%: 2.8 % (ref 0.0–7.0)
HCT: 29.1 % — ABNORMAL LOW (ref 34.8–46.6)
HGB: 9.4 g/dL — ABNORMAL LOW (ref 11.6–15.9)
LYMPH%: 16.9 % (ref 14.0–49.7)
MCH: 27.6 pg (ref 25.1–34.0)
MCHC: 32.3 g/dL (ref 31.5–36.0)
MCV: 85.3 fL (ref 79.5–101.0)
MONO#: 0.7 10*3/uL (ref 0.1–0.9)
MONO%: 9.2 % (ref 0.0–14.0)
NEUT#: 5 10*3/uL (ref 1.5–6.5)
NEUT%: 70.5 % (ref 38.4–76.8)
Platelets: 229 10*3/uL (ref 145–400)
RBC: 3.41 10*6/uL — AB (ref 3.70–5.45)
RDW: 15.9 % — ABNORMAL HIGH (ref 11.2–14.5)
WBC: 7.1 10*3/uL (ref 3.9–10.3)
lymph#: 1.2 10*3/uL (ref 0.9–3.3)

## 2016-05-06 LAB — COMPREHENSIVE METABOLIC PANEL
ALT: 11 U/L (ref 0–55)
AST: 13 U/L (ref 5–34)
Albumin: 4 g/dL (ref 3.5–5.0)
Alkaline Phosphatase: 88 U/L (ref 40–150)
Anion Gap: 11 mEq/L (ref 3–11)
BUN: 15.2 mg/dL (ref 7.0–26.0)
CALCIUM: 9.6 mg/dL (ref 8.4–10.4)
CHLORIDE: 104 meq/L (ref 98–109)
CO2: 23 mEq/L (ref 22–29)
Creatinine: 0.7 mg/dL (ref 0.6–1.1)
EGFR: 79 mL/min/{1.73_m2} — ABNORMAL LOW (ref >90)
Glucose: 128 mg/dl (ref 70–140)
POTASSIUM: 3.8 meq/L (ref 3.5–5.1)
SODIUM: 139 meq/L (ref 136–145)
Total Bilirubin: 0.39 mg/dL (ref 0.20–1.20)
Total Protein: 6.9 g/dL (ref 6.4–8.3)

## 2016-05-06 MED ORDER — DEXAMETHASONE SODIUM PHOSPHATE 10 MG/ML IJ SOLN
5.0000 mg | Freq: Once | INTRAMUSCULAR | Status: AC
  Administered 2016-05-06: 0.5 mg via INTRAVENOUS

## 2016-05-06 MED ORDER — SODIUM CHLORIDE 0.9 % IV SOLN
Freq: Once | INTRAVENOUS | Status: AC
  Administered 2016-05-06: 10:00:00 via INTRAVENOUS

## 2016-05-06 MED ORDER — DIPHENHYDRAMINE HCL 25 MG PO CAPS
ORAL_CAPSULE | ORAL | Status: AC
  Filled 2016-05-06: qty 1

## 2016-05-06 MED ORDER — SODIUM CHLORIDE 0.9 % IJ SOLN
10.0000 mL | INTRAMUSCULAR | Status: DC | PRN
  Administered 2016-05-06: 10 mL via INTRAVENOUS
  Filled 2016-05-06: qty 10

## 2016-05-06 MED ORDER — DEXAMETHASONE SODIUM PHOSPHATE 10 MG/ML IJ SOLN
INTRAMUSCULAR | Status: AC
  Filled 2016-05-06: qty 1

## 2016-05-06 MED ORDER — DIPHENHYDRAMINE HCL 25 MG PO CAPS
25.0000 mg | ORAL_CAPSULE | Freq: Once | ORAL | Status: AC
  Administered 2016-05-06: 25 mg via ORAL

## 2016-05-06 MED ORDER — HEPARIN SOD (PORK) LOCK FLUSH 100 UNIT/ML IV SOLN
500.0000 [IU] | Freq: Once | INTRAVENOUS | Status: AC | PRN
  Administered 2016-05-06: 500 [IU] via INTRAVENOUS
  Filled 2016-05-06: qty 5

## 2016-05-06 MED ORDER — IMMUNE GLOBULIN (HUMAN) 10 GM/100ML IV SOLN
25.0000 g | Freq: Once | INTRAVENOUS | Status: AC
  Administered 2016-05-06: 25 g via INTRAVENOUS
  Filled 2016-05-06: qty 200

## 2016-05-06 NOTE — Patient Instructions (Signed)

## 2016-05-07 LAB — IGG

## 2016-05-11 DIAGNOSIS — E1142 Type 2 diabetes mellitus with diabetic polyneuropathy: Secondary | ICD-10-CM | POA: Diagnosis not present

## 2016-05-11 DIAGNOSIS — R29898 Other symptoms and signs involving the musculoskeletal system: Secondary | ICD-10-CM | POA: Diagnosis not present

## 2016-05-11 DIAGNOSIS — M5116 Intervertebral disc disorders with radiculopathy, lumbar region: Secondary | ICD-10-CM | POA: Diagnosis not present

## 2016-05-12 DIAGNOSIS — M5116 Intervertebral disc disorders with radiculopathy, lumbar region: Secondary | ICD-10-CM | POA: Insufficient documentation

## 2016-05-19 ENCOUNTER — Ambulatory Visit (HOSPITAL_COMMUNITY): Payer: Medicare Other | Admitting: Nurse Practitioner

## 2016-05-24 ENCOUNTER — Ambulatory Visit (INDEPENDENT_AMBULATORY_CARE_PROVIDER_SITE_OTHER): Payer: Medicare Other | Admitting: Internal Medicine

## 2016-05-24 VITALS — BP 136/70 | HR 77 | Ht 66.5 in | Wt 146.2 lb

## 2016-05-24 DIAGNOSIS — I4819 Other persistent atrial fibrillation: Secondary | ICD-10-CM

## 2016-05-24 DIAGNOSIS — I481 Persistent atrial fibrillation: Secondary | ICD-10-CM

## 2016-05-24 NOTE — Patient Instructions (Signed)
Medication Instructions:  Your physician has recommended you make the following change in your medication:  1) Stop Diltiazem    Labwork: None ordered   Testing/Procedures: None ordered   Follow-Up: Your physician wants you to follow-up in: 6 months with Roderic Palau, NP and 12 months with Dr Vallery Ridge will receive a reminder letter in the mail two months in advance. If you don't receive a letter, please call our office to schedule the follow-up appointment.   Any Other Special Instructions Will Be Listed Below (If Applicable).     If you need a refill on your cardiac medications before your next appointment, please call your pharmacy.

## 2016-05-24 NOTE — Progress Notes (Signed)
PCP: Horatio Pel, MD Primary Cardiologist:  Dr Delanna Notice is a 76 y.o. female who presents today for routine electrophysiology followup.  Since her last visit, the patient reports doing very well. She is unaware of any further afib.  Today, she denies symptoms of palpitations, chest pain, shortness of breath,  lower extremity edema, dizziness, presyncope, or syncope.  The patient is otherwise without complaint today.   Past Medical History:  Diagnosis Date  . Arthritis   . Asthma   . Breast cancer, right ([place]) 2/17   R sided, s/p total mastectomy  . Chronic kidney disease    recurring UTI  . Complication of anesthesia    "morphine made me stop breathing"  . HCAP (healthcare-associated pneumonia) 12/2013   Archie Endo 01/18/2014  . History of skin cancer   . History of transfusion   . Hypertension   . Hypothyroidism   . Left atrial enlargement   . 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 Nocturia   . Persistent atrial fibrillation ([place])   . Shortness of breath dyspnea    "at times"  . Skin cancer   . Sleep apnea    uses c-pap  . Type II diabetes mellitus ([place])    Past Surgical History:  Procedure Laterality Date  . ABDOMINAL HYSTERECTOMY  1972   partial  . APPENDECTOMY  1953  . ATRIAL FIBRILLATION ABLATION  08/19/2015   PVI with CTI ablation by Dr Rayann Heman  . BACK SURGERY     x2  . CARDIAC CATHETERIZATION  10/10/2008   Nonobstructive CAD  . CARDIOVERSION  04/13/2011   Procedure: CARDIOVERSION;  Surgeon: Laverda Page, MD;  Location: [place];  Service: Cardiovascular;  Laterality: N/A;  . CARDIOVERSION N/A 06/18/2015   Procedure: CARDIOVERSION;  Surgeon: Adrian Prows, MD;  Location: [place];  Service: Cardiovascular;  Laterality: N/A;  . CATARACT EXTRACTION W/ INTRAOCULAR LENS  IMPLANT, BILATERAL Bilateral   . CHOLECYSTECTOMY  1993  . ELECTROPHYSIOLOGIC STUDY N/A 08/19/2015   Procedure: Atrial Fibrillation Ablation;  Surgeon: Thompson Grayer,  MD;  Location: [place] CV LAB;  Service: Cardiovascular;  Laterality: N/A;  . FOOT SURGERY    . LUMBAR LAMINECTOMY    . MASS BIOPSY Left 12/07/2013   Procedure: EXCISIONAL BIOPSY LEFT NECK MASS ;  Surgeon: Jerrell Belfast, MD;  Location: [place];  Service: ENT;  Laterality: Left;  Marland Kitchen MASTECTOMY    . PORTACATH PLACEMENT  12/2013   Archie Endo 01/04/2014  . PUBOVAGINAL SLING    . SKIN TAG REMOVAL  2012   leg  . TEE WITHOUT CARDIOVERSION N/A 08/19/2015   Procedure: TRANSESOPHAGEAL ECHOCARDIOGRAM (TEE);  Surgeon: Larey Dresser, MD;  Location: [place];  Service: Cardiovascular;  Laterality: N/A;  . TONSILLECTOMY    . TOTAL MASTECTOMY Right 05/27/2015   Procedure: RIGHT TOTAL MASTECTOMY;  Surgeon: Fanny Skates, MD;  Location: [facility];  Service: General;  Laterality: Right;    ROS- all systems are reviewed and negatives except as per HPI above  Current Outpatient Prescriptions  Medication Sig Dispense Refill  . acetaminophen (TYLENOL) 500 MG tablet Take 1,000 mg by mouth every 6 (six) hours as needed for moderate pain, fever or headache.    . albuterol (PROVENTIL HFA;VENTOLIN HFA) 108 (90 Base) MCG/ACT inhaler Inhale into the lungs as directed.    Marland Kitchen albuterol (PROVENTIL) (2.5 MG/3ML) 0.083% nebulizer solution Inhale 3 mLs into the lungs every 6 (six) hours as needed for wheezing or shortness of breath.     Marland Kitchen  anastrozole (ARIMIDEX) 1 MG tablet Take 1 tablet (1 mg total) by mouth daily. 90 tablet 3  . Cholecalciferol (VITAMIN D) 2000 UNITS tablet Take 2,000 Units by mouth daily with lunch.    . diclofenac sodium (VOLTAREN) 1 % GEL Apply 1 application topically 4 (four) times daily as needed.    . diltiazem (TIAZAC) 120 MG 24 hr capsule TAKE 1 CAPSULE BY MOUTH EVERY DAY 30 capsule 6  . Fluticasone-Salmeterol (ADVAIR) 250-50 MCG/DOSE AEPB Inhale 2 puffs into the lungs every 12 (twelve) hours.     . hydrochlorothiazide (HYDRODIURIL) 25 MG tablet Take 37.5 mg by mouth daily as needed (as needed for  leg swelling).    Marland Kitchen levothyroxine (SYNTHROID, LEVOTHROID) 200 MCG tablet Take 200 mcg by mouth daily before breakfast.    . lidocaine-prilocaine (EMLA) cream Apply 1 application topically as needed. Apply to [facility] a Cath site at least one hour prior to needle stick as needed. 30 g 10  . Magnesium Oxide 500 MG TABS Take 500 mg by mouth daily.     . metFORMIN (GLUCOPHAGE-XR) 500 MG 24 hr tablet Take 500 mg by mouth 2 (two) times daily with a meal.    . montelukast (SINGULAIR) 10 MG tablet Take 10 mg by mouth daily.    . Omega-3-acid Ethyl Esters (LOVAZA PO) Take 1 capsule by mouth 2 (two) times daily.    . ondansetron (ZOFRAN) 8 MG tablet Take 1 tablet (8 mg total) by mouth every 8 (eight) hours as needed for nausea. 60 tablet 3  . pravastatin (PRAVACHOL) 20 MG tablet 1/2 tab mon-thurs    . rivaroxaban (XARELTO) 20 MG TABS tablet Take 20 mg by mouth daily with supper.    . traMADol-acetaminophen (ULTRACET) 37.5-325 MG tablet Take 1 tablet by mouth every 6 (six) hours as needed (Pain).    . vitamin B-12 (CYANOCOBALAMIN) 500 MCG tablet Take 500 mcg by mouth daily.     No current facility-administered medications for this visit.     Physical Exam: Vitals:   05/24/16 1520  BP: 136/70  Pulse: 77  SpO2: 97%  Weight: 146 lb 4 oz (66.3 kg)  Height: 5' 6.5" (1.689 m)    GEN- The patient is well appearing, alert and oriented x 3 today.   Head- normocephalic, atraumatic Eyes-  Sclera clear, conjunctiva pink Ears- hearing intact Oropharynx- clear Lungs- Clear to ausculation bilaterally, normal work of breathing Heart- Regular rate and rhythm, no murmurs, rubs or gallops, PMI not laterally displaced GI- soft, NT, ND, + BS Extremities- no clubbing, cyanosis, or edema MS- walks slowly  ekg today reveals sinus rhythm 77 bpm, PR 274 msec, LAD, incomplete RBBB  Assessment and Plan:  1. Afib/ atrial flutter Doing well s/p ablation off AAD therapy Continue long term anticoagulation with  xarelto Stop diltiazem  Follow-up with Butch Penny in the AF clinic in 6 months I will see again in 12 months Follow-up with Dr Einar Gip as scheduled  Thompson Grayer MD, [facility] 05/24/2016 3:47 PM

## 2016-06-03 ENCOUNTER — Encounter: Payer: Self-pay | Admitting: Hematology and Oncology

## 2016-06-03 ENCOUNTER — Ambulatory Visit: Payer: Medicare Other

## 2016-06-03 ENCOUNTER — Telehealth: Payer: Self-pay | Admitting: Hematology and Oncology

## 2016-06-03 ENCOUNTER — Telehealth: Payer: Self-pay

## 2016-06-03 ENCOUNTER — Other Ambulatory Visit (HOSPITAL_BASED_OUTPATIENT_CLINIC_OR_DEPARTMENT_OTHER): Payer: Medicare Other

## 2016-06-03 ENCOUNTER — Ambulatory Visit (HOSPITAL_BASED_OUTPATIENT_CLINIC_OR_DEPARTMENT_OTHER): Payer: Medicare Other

## 2016-06-03 ENCOUNTER — Ambulatory Visit (HOSPITAL_BASED_OUTPATIENT_CLINIC_OR_DEPARTMENT_OTHER): Payer: Medicare Other | Admitting: Hematology and Oncology

## 2016-06-03 VITALS — BP 153/60 | HR 82 | Temp 98.1°F | Resp 17 | Ht 66.5 in | Wt 145.1 lb

## 2016-06-03 VITALS — BP 143/78 | HR 89 | Temp 98.3°F | Resp 18

## 2016-06-03 DIAGNOSIS — C8591 Non-Hodgkin lymphoma, unspecified, lymph nodes of head, face, and neck: Secondary | ICD-10-CM

## 2016-06-03 DIAGNOSIS — D801 Nonfamilial hypogammaglobulinemia: Secondary | ICD-10-CM

## 2016-06-03 DIAGNOSIS — N390 Urinary tract infection, site not specified: Secondary | ICD-10-CM | POA: Diagnosis not present

## 2016-06-03 DIAGNOSIS — C50211 Malignant neoplasm of upper-inner quadrant of right female breast: Secondary | ICD-10-CM | POA: Diagnosis not present

## 2016-06-03 DIAGNOSIS — D638 Anemia in other chronic diseases classified elsewhere: Secondary | ICD-10-CM | POA: Diagnosis not present

## 2016-06-03 DIAGNOSIS — I482 Chronic atrial fibrillation, unspecified: Secondary | ICD-10-CM

## 2016-06-03 DIAGNOSIS — Z170 Estrogen receptor positive status [ER+]: Secondary | ICD-10-CM

## 2016-06-03 LAB — URINALYSIS, MICROSCOPIC - CHCC
Bilirubin (Urine): NEGATIVE
Blood: NEGATIVE
Glucose: NEGATIVE mg/dL
KETONES: NEGATIVE mg/dL
Nitrite: POSITIVE
Protein: NEGATIVE mg/dL
SPECIFIC GRAVITY, URINE: 1.01 (ref 1.003–1.035)
UROBILINOGEN UR: 0.2 mg/dL (ref 0.2–1)
pH: 5 (ref 4.6–8.0)

## 2016-06-03 LAB — CBC WITH DIFFERENTIAL/PLATELET
BASO%: 0.6 % (ref 0.0–2.0)
BASOS ABS: 0 10*3/uL (ref 0.0–0.1)
EOS%: 1.8 % (ref 0.0–7.0)
Eosinophils Absolute: 0.1 10*3/uL (ref 0.0–0.5)
HEMATOCRIT: 28.5 % — AB (ref 34.8–46.6)
HEMOGLOBIN: 9.7 g/dL — AB (ref 11.6–15.9)
LYMPH#: 1.3 10*3/uL (ref 0.9–3.3)
LYMPH%: 18 % (ref 14.0–49.7)
MCH: 27.9 pg (ref 25.1–34.0)
MCHC: 34 g/dL (ref 31.5–36.0)
MCV: 82.2 fL (ref 79.5–101.0)
MONO#: 0.6 10*3/uL (ref 0.1–0.9)
MONO%: 9.1 % (ref 0.0–14.0)
NEUT#: 4.9 10*3/uL (ref 1.5–6.5)
NEUT%: 70.5 % (ref 38.4–76.8)
Platelets: 303 10*3/uL (ref 145–400)
RBC: 3.46 10*6/uL — ABNORMAL LOW (ref 3.70–5.45)
RDW: 15.8 % — AB (ref 11.2–14.5)
WBC: 7 10*3/uL (ref 3.9–10.3)

## 2016-06-03 LAB — COMPREHENSIVE METABOLIC PANEL
ALK PHOS: 82 U/L (ref 40–150)
ALT: 12 U/L (ref 0–55)
ANION GAP: 11 meq/L (ref 3–11)
AST: 14 U/L (ref 5–34)
Albumin: 4 g/dL (ref 3.5–5.0)
BILIRUBIN TOTAL: 0.47 mg/dL (ref 0.20–1.20)
BUN: 12.3 mg/dL (ref 7.0–26.0)
CALCIUM: 10.1 mg/dL (ref 8.4–10.4)
CO2: 24 meq/L (ref 22–29)
Chloride: 105 mEq/L (ref 98–109)
Creatinine: 0.7 mg/dL (ref 0.6–1.1)
EGFR: 78 mL/min/{1.73_m2} — AB (ref >90)
Glucose: 123 mg/dl (ref 70–140)
Potassium: 3.8 mEq/L (ref 3.5–5.1)
Sodium: 140 mEq/L (ref 136–145)
TOTAL PROTEIN: 6.9 g/dL (ref 6.4–8.3)

## 2016-06-03 MED ORDER — ALTEPLASE 2 MG IJ SOLR
2.0000 mg | Freq: Once | INTRAMUSCULAR | Status: DC | PRN
  Filled 2016-06-03: qty 2

## 2016-06-03 MED ORDER — DIPHENHYDRAMINE HCL 25 MG PO CAPS
ORAL_CAPSULE | ORAL | Status: AC
  Filled 2016-06-03: qty 1

## 2016-06-03 MED ORDER — HEPARIN SOD (PORK) LOCK FLUSH 100 UNIT/ML IV SOLN
500.0000 [IU] | Freq: Once | INTRAVENOUS | Status: AC | PRN
  Administered 2016-06-03: 500 [IU] via INTRAVENOUS
  Filled 2016-06-03: qty 5

## 2016-06-03 MED ORDER — SODIUM CHLORIDE 0.9 % IJ SOLN
10.0000 mL | INTRAMUSCULAR | Status: DC | PRN
  Administered 2016-06-03: 10 mL via INTRAVENOUS
  Filled 2016-06-03: qty 10

## 2016-06-03 MED ORDER — DEXAMETHASONE SODIUM PHOSPHATE 10 MG/ML IJ SOLN
INTRAMUSCULAR | Status: AC
  Filled 2016-06-03: qty 1

## 2016-06-03 MED ORDER — DEXAMETHASONE SODIUM PHOSPHATE 10 MG/ML IJ SOLN
5.0000 mg | Freq: Once | INTRAMUSCULAR | Status: AC
  Administered 2016-06-03: 5 mg via INTRAVENOUS

## 2016-06-03 MED ORDER — IMMUNE GLOBULIN (HUMAN) 10 GM/100ML IV SOLN
400.0000 mg/kg | Freq: Once | INTRAVENOUS | Status: AC
  Administered 2016-06-03: 25 g via INTRAVENOUS
  Filled 2016-06-03: qty 200

## 2016-06-03 MED ORDER — DIPHENHYDRAMINE HCL 25 MG PO CAPS
25.0000 mg | ORAL_CAPSULE | Freq: Once | ORAL | Status: AC
  Administered 2016-06-03: 25 mg via ORAL

## 2016-06-03 MED ORDER — NITROFURANTOIN MONOHYD MACRO 100 MG PO CAPS: 100.0000 mg | ORAL_CAPSULE | Freq: Two times a day (BID) | ORAL | 0 refills | Status: DC

## 2016-06-03 NOTE — Progress Notes (Signed)
[facility] OFFICE PROGRESS NOTE  Patient Care Team: Deland Pretty, MD as PCP - General (Internal Medicine) Adrian Prows, MD as Consulting Physician (Cardiology) Heath Lark, MD as Consulting Physician (Hematology and Oncology) Fanny Skates, MD as Consulting Physician (General Surgery) Eppie Gibson, MD as Attending Physician (Radiation Oncology) Sylvan Cheese, NP as Nurse Practitioner (Hematology and Oncology) Thompson Grayer, MD as Consulting Physician (Cardiology) Lahoma Rocker, MD as Consulting Physician (Rheumatology) Terrance Mass, MD as Consulting Physician (Gynecology)  SUMMARY OF ONCOLOGIC HISTORY: Oncology History   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      01/03/2014 Bone Marrow Biopsy    Accession: [id number] Bone marrow biopsy is negative      01/04/2014 Imaging    ECHO showed normal EF      01/04/2014 Procedure    She has placement of port      01/09/2014 - 03/07/2014 Chemotherapy    She was given treatment with bendamustine with rituximab. Treatment was stopped due to severe side-effects despite significant dose adjustment for cycle 2      01/18/2014 - 02/01/2014 Hospital Admission    She was admitted to the hospital from Escherichia coli sepsis with multiorgan failure and brief episodes of  intubation. She was discharged to skilled nursing facility      02/26/2014 Imaging    PET/CT scan showed near complete remission      02/27/2014 Adverse Reaction    Cycle 2 of treatment was resumed with drastic dose adjustment to bendamustine due to recent multi-organ failure      04/03/2014 - 03/13/2015 Chemotherapy    She is started on maintenance rituximab only.      04/09/2014 - 04/12/2014 Hospital Admission    The patient was admitted to the hospital with urinary tract infection and sepsis.      06/05/2014 Imaging    PET CT scan showed complete response to Rx      02/12/2015 Imaging    Ct scan showed no evidence of disease. It shows she has new pneumonia      04/02/2015 Miscellaneous    She received 1 dose IVIG complicated by mild infusion reaction      04/26/2015 - 04/30/2015 Hospital Admission    She had recurrent admission to the hospital with sepsis      10/22/2015 Imaging    CT scan of chest and abdomen showed Ill-defined tissue in the porta hepatis suggest treated lymphoma. No evidence of lymphadenopathy in the chest, abdomen & pelvis to suggest recurrent lymphoma.       Breast cancer of upper-inner quadrant of right female breast ([place])   11/06/2014 Imaging    DEXA scan showed osteopenia T-1.9 on bilateral femoral neck  03/31/2015 Imaging    Screening mammogram showed suspicious lesion, confirmed on diagnostic imaging at 2 and 230 position on the right breast      04/10/2015 Pathology Results    Accession: [id number] breast biopsy in 2 locations came back invasive ductal carcinoma with calcification, 100% ER and PR positive, HER 2 neg      04/10/2015 Clinical Stage    Stage IA: T1 N0      05/27/2015 Surgery    Right total mastectomy: multifocal IDC, 1.5 and 1.0 cm, neg margins, ER+ (100% - both); PR+ (100% and 95%), HER2neu negative (ratio 1.69 and 1.14) Ki67 2% and 10%. DCIS      05/27/2015 Pathologic Stage    Stage IA: mpT1c pNx pMx      06/17/2015  Survivorship    Survivorship care plan mailed to patient at her request      07/14/2015 -  Anti-estrogen oral therapy    She started taking Arimidex      04/01/2016 Imaging    No mammographic evidence of malignancy in the left breast.       INTERVAL HISTORY: Please see below for problem oriented charting. She feels well, except for the past week she has symptoms of dysuria and passage of cloudy urine No chills, flank pain or hematuria The patient denies any recent signs or symptoms of bleeding such as spontaneous epistaxis or hematochezia. She denies any recent abnormal breast examination, palpable mass, abnormal breast appearance or nipple changes No recent lymphadenopathy She denies significant hot flashes, mood swings or depression with Arimidex  REVIEW OF SYSTEMS:   Constitutional: Denies fevers, chills or abnormal weight loss Eyes: Denies blurriness of vision Ears, nose, mouth, throat, and face: Denies mucositis or sore throat Respiratory: Denies cough, dyspnea or wheezes Cardiovascular: Denies palpitation, chest discomfort or lower extremity swelling Gastrointestinal:  Denies nausea, heartburn or change in bowel habits Skin: Denies abnormal skin rashes Lymphatics: Denies new lymphadenopathy or easy bruising Neurological:Denies numbness, tingling or new weaknesses Behavioral/Psych: Mood is stable, no new changes  All other systems were reviewed with the patient and are negative.  I have reviewed the past medical history, past surgical history, social history and family history with the patient and they are unchanged from previous note.  ALLERGIES:  is allergic to morphine and related; multaq [dronedarone]; demerol; oysters [shellfish allergy]; penicillins; and sulfa drugs cross reactors.  MEDICATIONS:  Current Outpatient Prescriptions  Medication Sig Dispense Refill  . acetaminophen (TYLENOL) 500 MG tablet Take 1,000 mg by mouth every 6 (six) hours as needed for moderate  pain, fever or headache.    . albuterol (PROVENTIL HFA;VENTOLIN HFA) 108 (90 Base) MCG/ACT inhaler Inhale into the lungs as directed.    Marland Kitchen albuterol (PROVENTIL) (2.5 MG/3ML) 0.083% nebulizer solution Inhale 3 mLs into the lungs every 6 (six) hours as needed for wheezing or shortness of breath.     . anastrozole (ARIMIDEX) 1 MG tablet Take 1 tablet (1 mg total) by mouth daily. 90 tablet 3  . Cholecalciferol (VITAMIN D) 2000 UNITS tablet Take 2,000 Units by mouth daily with lunch.    . diclofenac sodium (VOLTAREN) 1 % GEL Apply 1 application topically 4 (four) times daily as needed.    . Fluticasone-Salmeterol (ADVAIR) 250-50 MCG/DOSE AEPB Inhale 2 puffs into the lungs every 12 (twelve) hours.     . hydrochlorothiazide (HYDRODIURIL) 25 MG tablet Take 37.5 mg by mouth daily as needed (as needed for leg swelling).    Marland Kitchen levothyroxine (SYNTHROID, LEVOTHROID) 200 MCG  tablet Take 200 mcg by mouth daily before breakfast.    . lidocaine-prilocaine (EMLA) cream Apply 1 application topically as needed. Apply to [facility] a Cath site at least one hour prior to needle stick as needed. 30 g 10  . Magnesium Oxide 500 MG TABS Take 500 mg by mouth daily.     . metFORMIN (GLUCOPHAGE-XR) 500 MG 24 hr tablet Take 500 mg by mouth 2 (two) times daily with a meal.    . montelukast (SINGULAIR) 10 MG tablet Take 10 mg by mouth daily.    . nitrofurantoin, macrocrystal-monohydrate, (MACROBID) 100 MG capsule Take 1 capsule (100 mg total) by mouth 2 (two) times daily. 14 capsule 0  . Omega-3-acid Ethyl Esters (LOVAZA PO) Take 1 capsule by mouth 2 (two) times daily.    . ondansetron (ZOFRAN) 8 MG tablet Take 1 tablet (8 mg total) by mouth every 8 (eight) hours as needed for nausea. 60 tablet 3  . pravastatin (PRAVACHOL) 20 MG tablet 1/2 tab mon-thurs    . rivaroxaban (XARELTO) 20 MG TABS tablet Take 20 mg by mouth daily with supper.    . traMADol-acetaminophen (ULTRACET) 37.5-325 MG tablet Take 1 tablet by mouth every 6 (six) hours  as needed (Pain).    . vitamin B-12 (CYANOCOBALAMIN) 500 MCG tablet Take 500 mcg by mouth daily.     No current facility-administered medications for this visit.     PHYSICAL EXAMINATION: ECOG PERFORMANCE STATUS: 1 - Symptomatic but completely ambulatory  Vitals:   06/03/16 0912  BP: (!) 153/60  Pulse: 82  Resp: 17  Temp: 98.1 F (36.7 C)   Filed Weights   06/03/16 0912  Weight: 145 lb 1.6 oz (65.8 kg)    GENERAL:alert, no distress and comfortable SKIN: skin color, texture, turgor are normal, no rashes or significant lesions EYES: normal, Conjunctiva are pink and non-injected, sclera clear OROPHARYNX:no exudate, no erythema and lips, buccal mucosa, and tongue normal  NECK: supple, thyroid normal size, non-tender, without nodularity LYMPH:  no palpable lymphadenopathy in the cervical, axillary or inguinal LUNGS: clear to auscultation and percussion with normal breathing effort HEART: regular rate & rhythm and no murmurs and no lower extremity edema ABDOMEN:abdomen soft, non-tender and normal bowel sounds Musculoskeletal:no cyanosis of digits and no clubbing  NEURO: alert & oriented x 3 with fluent speech, no focal motor/sensory deficits  LABORATORY DATA:  I have reviewed the data as listed    Component Value Date/Time   NA 140 06/03/2016 0854   K 3.8 06/03/2016 0854   CL 108 08/20/2015 0430   CO2 24 06/03/2016 0854   GLUCOSE 123 06/03/2016 0854   BUN 12.3 06/03/2016 0854   CREATININE 0.7 06/03/2016 0854   CALCIUM 10.1 06/03/2016 0854   PROT 6.9 06/03/2016 0854   ALBUMIN 4.0 06/03/2016 0854   AST 14 06/03/2016 0854   ALT 12 06/03/2016 0854   ALKPHOS 82 06/03/2016 0854   BILITOT 0.47 06/03/2016 0854   GFRNONAA >60 08/20/2015 0430   GFRAA >60 08/20/2015 0430    No results found for: SPEP, UPEP  Lab Results  Component Value Date   WBC 7.0 06/03/2016   NEUTROABS 4.9 06/03/2016   HGB 9.7 (L) 06/03/2016   HCT 28.5 (L) 06/03/2016   MCV 82.2 06/03/2016   PLT 303  06/03/2016      Chemistry      Component Value Date/Time   NA 140 06/03/2016 0854   K 3.8 06/03/2016 0854   CL 108 08/20/2015 0430   CO2  24 06/03/2016 0854   BUN 12.3 06/03/2016 0854   CREATININE 0.7 06/03/2016 0854      Component Value Date/Time   CALCIUM 10.1 06/03/2016 0854   ALKPHOS 82 06/03/2016 0854   AST 14 06/03/2016 0854   ALT 12 06/03/2016 0854   BILITOT 0.47 06/03/2016 0854      ASSESSMENT & PLAN:  Breast cancer of upper-inner quadrant of right female breast ([place]) She is taking Arimidex since 07/14/2015 as part of the adjuvant treatment for breast cancer. So far, she tolerated treatment well without any side effects. 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 I recommend IVIG monthly and I'll see her back in 2 months I have plan to check immunoglobulin levels in March but insufficient blood was sent for analysis We will check it again today  UTI (urinary tract infection) She has symptoms of suprapubic discomfort, passage of cloudy urine and symptoms of UTI She denies chills or flank pain I will order urine analysis and urine culture Based on prior sensitivity, I will start her on nitrofurantoin  Anemia in chronic illness This is likely due to recent treatment and anemia of chronic disease. The patient denies recent history of bleeding such as epistaxis, hematuria or hematochezia. She is asymptomatic from the anemia. I will observe for now.     Chronic atrial fibrillation ([facility]) She has chronic atrial fibrillation, s/p  ablation and now in sinus rhythm Currently, she is on medical management & chronic anticoagulation therapy as directed by cardiologist She has no bleeding from anticoagulation therapy   Orders Placed This Encounter  Procedures  . Urine culture    Standing Status:   Future    Number of Occurrences:   1    Standing Expiration Date:   07/08/2017  . Urinalysis, Microscopic - [facility]    Standing Status:   Future    Number of Occurrences:   1    Standing Expiration Date:   07/08/2017  . IgG, IgA, IgM    Standing Status:   Future    Number of Occurrences:   1    Standing Expiration Date:   07/08/2017   All questions were answered. The patient knows to call the clinic with any problems, questions or concerns. No barriers to learning was detected. I spent 25 minutes counseling the patient face to face. The total time spent in the appointment was 40 minutes and more than 50% was on counseling and review of test results     Heath Lark, MD 06/03/2016 9:47 AM

## 2016-06-03 NOTE — Assessment & Plan Note (Signed)
She has symptoms of suprapubic discomfort, passage of cloudy urine and symptoms of UTI She denies chills or flank pain I will order urine analysis and urine culture Based on prior sensitivity, I will start her on nitrofurantoin

## 2016-06-03 NOTE — Assessment & Plan Note (Signed)
This is likely due to recent treatment and anemia of chronic disease. The patient denies recent history of bleeding such as epistaxis, hematuria or hematochezia. She is asymptomatic from the anemia. I will observe for now.  

## 2016-06-03 NOTE — Assessment & Plan Note (Signed)
Repeat imaging study August 2017 showed no evidence of recurrence She has no signs of disease recurrence clinically I will repeat imaging study in August 2018

## 2016-06-03 NOTE — Telephone Encounter (Signed)
Gave patient AVS and calender per 06/03/2016 los .  

## 2016-06-03 NOTE — Assessment & Plan Note (Signed)
This is due to rituximab. I recommend IVIG monthly and I'll see her back in 2 months I have plan to check immunoglobulin levels in March but insufficient blood was sent for analysis We will check it again today

## 2016-06-03 NOTE — Assessment & Plan Note (Signed)
She is taking Arimidex since 07/14/2015 as part of the adjuvant treatment for breast cancer. So far, she tolerated treatment well without any side effects. I will continue to see her on the regular basis including breast examination and will make sure that she get regular screening mammogram Her recent screening mammogram in February 2018 was normal

## 2016-06-03 NOTE — Patient Instructions (Signed)

## 2016-06-03 NOTE — Telephone Encounter (Signed)
-----   Message from Heath Lark, MD sent at 06/03/2016 12:27 PM EDT ----- Regarding: UTI PLs call her and let her know it looks like UTI Start the antibiotics today ----- Message ----- From: Interface, Lab In Three Zero One Sent: 06/03/2016   9:12 AM To: Heath Lark, MD

## 2016-06-03 NOTE — Telephone Encounter (Signed)
Called and given below message. 

## 2016-06-03 NOTE — Assessment & Plan Note (Signed)
She has chronic atrial fibrillation, s/p ablation and now in sinus rhythm Currently, she is on medical management & chronic anticoagulation therapy as directed by cardiologist She has no bleeding from anticoagulation therapy

## 2016-06-04 LAB — IGG, IGA, IGM
IGG (IMMUNOGLOBIN G), SERUM: 679 mg/dL — AB (ref 700–1600)
IgA, Qn, Serum: 71 mg/dL (ref 64–422)
IgM, Qn, Serum: 15 mg/dL — ABNORMAL LOW (ref 26–217)

## 2016-06-07 ENCOUNTER — Telehealth: Payer: Self-pay | Admitting: *Deleted

## 2016-06-07 NOTE — Telephone Encounter (Signed)
-----   Message from Heath Lark, MD sent at 06/07/2016 12:49 PM EDT ----- Regarding: urine culture result Pls ask how is she? Urine Cx confirmed UTI; the nitrofurantoin I prescribed should cover it ----- Message ----- From: Interface, Lab In Three Zero One Sent: 06/03/2016   9:12 AM To: Heath Lark, MD

## 2016-06-07 NOTE — Telephone Encounter (Signed)
Feeling better, will complete antibiotics

## 2016-06-08 LAB — URINE CULTURE

## 2016-06-24 ENCOUNTER — Other Ambulatory Visit: Payer: Self-pay | Admitting: Hematology and Oncology

## 2016-06-24 ENCOUNTER — Other Ambulatory Visit: Payer: Self-pay | Admitting: *Deleted

## 2016-06-24 ENCOUNTER — Ambulatory Visit (HOSPITAL_BASED_OUTPATIENT_CLINIC_OR_DEPARTMENT_OTHER): Payer: Medicare Other

## 2016-06-24 ENCOUNTER — Telehealth: Payer: Self-pay | Admitting: *Deleted

## 2016-06-24 DIAGNOSIS — N390 Urinary tract infection, site not specified: Secondary | ICD-10-CM | POA: Diagnosis not present

## 2016-06-24 DIAGNOSIS — C50211 Malignant neoplasm of upper-inner quadrant of right female breast: Secondary | ICD-10-CM | POA: Diagnosis present

## 2016-06-24 DIAGNOSIS — C8591 Non-Hodgkin lymphoma, unspecified, lymph nodes of head, face, and neck: Secondary | ICD-10-CM

## 2016-06-24 LAB — URINALYSIS, MICROSCOPIC - CHCC
Bilirubin (Urine): NEGATIVE
GLUCOSE UR CHCC: NEGATIVE mg/dL
KETONES: NEGATIVE mg/dL
Nitrite: POSITIVE
PH: 6 (ref 4.6–8.0)
PROTEIN: NEGATIVE mg/dL
Specific Gravity, Urine: 1.01 (ref 1.003–1.035)
UROBILINOGEN UR: 0.2 mg/dL (ref 0.2–1)

## 2016-06-24 LAB — CBC WITH DIFFERENTIAL/PLATELET
BASO%: 0.5 % (ref 0.0–2.0)
Basophils Absolute: 0 10*3/uL (ref 0.0–0.1)
EOS ABS: 0.1 10*3/uL (ref 0.0–0.5)
EOS%: 1.4 % (ref 0.0–7.0)
HCT: 30.9 % — ABNORMAL LOW (ref 34.8–46.6)
HGB: 10.2 g/dL — ABNORMAL LOW (ref 11.6–15.9)
LYMPH%: 17.3 % (ref 14.0–49.7)
MCH: 27.2 pg (ref 25.1–34.0)
MCHC: 33.1 g/dL (ref 31.5–36.0)
MCV: 82.3 fL (ref 79.5–101.0)
MONO#: 0.8 10*3/uL (ref 0.1–0.9)
MONO%: 9.3 % (ref 0.0–14.0)
NEUT%: 71.5 % (ref 38.4–76.8)
NEUTROS ABS: 6 10*3/uL (ref 1.5–6.5)
Platelets: 257 10*3/uL (ref 145–400)
RBC: 3.76 10*6/uL (ref 3.70–5.45)
RDW: 16.4 % — ABNORMAL HIGH (ref 11.2–14.5)
WBC: 8.4 10*3/uL (ref 3.9–10.3)
lymph#: 1.4 10*3/uL (ref 0.9–3.3)

## 2016-06-24 LAB — COMPREHENSIVE METABOLIC PANEL
ALT: 14 U/L (ref 0–55)
AST: 16 U/L (ref 5–34)
Albumin: 4.1 g/dL (ref 3.5–5.0)
Alkaline Phosphatase: 84 U/L (ref 40–150)
Anion Gap: 11 mEq/L (ref 3–11)
BUN: 16.6 mg/dL (ref 7.0–26.0)
CO2: 25 meq/L (ref 22–29)
Calcium: 10.2 mg/dL (ref 8.4–10.4)
Chloride: 105 mEq/L (ref 98–109)
Creatinine: 0.8 mg/dL (ref 0.6–1.1)
EGFR: 75 mL/min/{1.73_m2} — AB (ref >90)
GLUCOSE: 102 mg/dL (ref 70–140)
POTASSIUM: 4 meq/L (ref 3.5–5.1)
SODIUM: 141 meq/L (ref 136–145)
Total Bilirubin: 0.53 mg/dL (ref 0.20–1.20)
Total Protein: 7.1 g/dL (ref 6.4–8.3)

## 2016-06-24 NOTE — Telephone Encounter (Signed)
Message received from patient in triage stating that her urine is cloudy with an odor and was told by Dr. Alvy Bimler to contact [facility] with any UTI symptoms.  Will notify Dr. Alvy Bimler and call patient back with MD instructions.  Patient's next scheduled appt is 07/01/16.

## 2016-06-24 NOTE — Telephone Encounter (Signed)
Call placed to patient and patient notified per order of Dr. Alvy Bimler to come to [facility] for blood work and u/a and that she would be notified of results tomorrow.  Patient appreciative of call back and states that she can be here at 1:30PM.  Message sent to scheduling.

## 2016-06-24 NOTE — Telephone Encounter (Signed)
Please schedule lab appt for blood test and urine test (ordered). Will call tomorrow once results are back for further instruction

## 2016-06-28 ENCOUNTER — Other Ambulatory Visit: Payer: Self-pay

## 2016-06-28 ENCOUNTER — Telehealth: Payer: Self-pay

## 2016-06-28 LAB — URINE CULTURE

## 2016-06-28 MED ORDER — CIPROFLOXACIN HCL 250 MG PO TABS: 250.0000 mg | ORAL_TABLET | Freq: Two times a day (BID) | ORAL | 0 refills | Status: AC

## 2016-06-28 NOTE — Telephone Encounter (Signed)
Called patient, informed that Rx called into pharmacy.

## 2016-06-28 NOTE — Telephone Encounter (Signed)
Pt came in on 4/26 for lab and ua for sx of UTI. She never got a call back. UA, C&S are complete in chart. She is still experiencing tender bladder, pain on urination and cloudy urine.   She uses [facility]

## 2016-06-28 NOTE — Telephone Encounter (Signed)
Pls call in cipro 250 mg BID PO x 7 days, no refill

## 2016-06-29 ENCOUNTER — Other Ambulatory Visit: Payer: Self-pay | Admitting: *Deleted

## 2016-06-29 ENCOUNTER — Telehealth: Payer: Self-pay

## 2016-06-29 NOTE — Telephone Encounter (Signed)
LM for patient to call. Dr Alvy Bimler wants her to take Cipro 250 mg BID for 7 days. RX sent to pharmacy by Dr Alvy Bimler 4/30

## 2016-06-29 NOTE — Telephone Encounter (Signed)
Pt returned Tammi's call. Tammi not avialable. Pt states she started cipro last night. She will start taking her temp BID. She feels just a little better but the odor and some pain are still there.

## 2016-07-01 ENCOUNTER — Other Ambulatory Visit (HOSPITAL_BASED_OUTPATIENT_CLINIC_OR_DEPARTMENT_OTHER): Payer: Medicare Other

## 2016-07-01 ENCOUNTER — Other Ambulatory Visit: Payer: Self-pay | Admitting: Hematology and Oncology

## 2016-07-01 ENCOUNTER — Ambulatory Visit (HOSPITAL_BASED_OUTPATIENT_CLINIC_OR_DEPARTMENT_OTHER): Payer: Medicare Other

## 2016-07-01 VITALS — BP 143/74 | HR 90 | Temp 98.1°F | Resp 18

## 2016-07-01 DIAGNOSIS — D801 Nonfamilial hypogammaglobulinemia: Secondary | ICD-10-CM

## 2016-07-01 DIAGNOSIS — C8591 Non-Hodgkin lymphoma, unspecified, lymph nodes of head, face, and neck: Secondary | ICD-10-CM | POA: Diagnosis present

## 2016-07-01 LAB — COMPREHENSIVE METABOLIC PANEL
ALBUMIN: 4.1 g/dL (ref 3.5–5.0)
ALK PHOS: 81 U/L (ref 40–150)
ALT: 16 U/L (ref 0–55)
ANION GAP: 12 meq/L — AB (ref 3–11)
AST: 16 U/L (ref 5–34)
BILIRUBIN TOTAL: 0.51 mg/dL (ref 0.20–1.20)
BUN: 12.9 mg/dL (ref 7.0–26.0)
CALCIUM: 9.9 mg/dL (ref 8.4–10.4)
CO2: 23 mEq/L (ref 22–29)
Chloride: 105 mEq/L (ref 98–109)
Creatinine: 0.7 mg/dL (ref 0.6–1.1)
EGFR: 79 mL/min/{1.73_m2} — AB (ref >90)
Glucose: 142 mg/dl — ABNORMAL HIGH (ref 70–140)
POTASSIUM: 3.9 meq/L (ref 3.5–5.1)
SODIUM: 140 meq/L (ref 136–145)
Total Protein: 6.9 g/dL (ref 6.4–8.3)

## 2016-07-01 MED ORDER — DEXTROSE 5 % IV SOLN
Freq: Once | INTRAVENOUS | Status: AC
  Administered 2016-07-01: 09:00:00 via INTRAVENOUS

## 2016-07-01 MED ORDER — DIPHENHYDRAMINE HCL 25 MG PO CAPS
ORAL_CAPSULE | ORAL | Status: AC
  Filled 2016-07-01: qty 1

## 2016-07-01 MED ORDER — DEXAMETHASONE SODIUM PHOSPHATE 10 MG/ML IJ SOLN
INTRAMUSCULAR | Status: AC
  Filled 2016-07-01: qty 1

## 2016-07-01 MED ORDER — DIPHENHYDRAMINE HCL 25 MG PO CAPS
25.0000 mg | ORAL_CAPSULE | Freq: Once | ORAL | Status: AC
  Administered 2016-07-01: 25 mg via ORAL

## 2016-07-01 MED ORDER — IMMUNE GLOBULIN (HUMAN) 10 GM/100ML IV SOLN
25.0000 g | Freq: Once | INTRAVENOUS | Status: AC
  Administered 2016-07-01: 25 g via INTRAVENOUS
  Filled 2016-07-01: qty 200

## 2016-07-01 MED ORDER — HEPARIN SOD (PORK) LOCK FLUSH 100 UNIT/ML IV SOLN
500.0000 [IU] | Freq: Once | INTRAVENOUS | Status: AC | PRN
  Administered 2016-07-01: 500 [IU] via INTRAVENOUS
  Filled 2016-07-01: qty 5

## 2016-07-01 MED ORDER — DEXAMETHASONE SODIUM PHOSPHATE 10 MG/ML IJ SOLN
5.0000 mg | Freq: Once | INTRAMUSCULAR | Status: AC
  Administered 2016-07-01: 5 mg via INTRAVENOUS

## 2016-07-01 MED ORDER — SODIUM CHLORIDE 0.9 % IJ SOLN
10.0000 mL | INTRAMUSCULAR | Status: DC | PRN
  Administered 2016-07-01: 10 mL via INTRAVENOUS
  Filled 2016-07-01: qty 10

## 2016-07-01 NOTE — Patient Instructions (Signed)

## 2016-07-01 NOTE — Progress Notes (Signed)
Patient tolerated infusion well and was monitored for 30 minutes post transfusion. Patient and vital signs stable upon discharge.

## 2016-07-08 ENCOUNTER — Telehealth: Payer: Self-pay

## 2016-07-08 ENCOUNTER — Other Ambulatory Visit: Payer: Self-pay

## 2016-07-08 MED ORDER — NITROFURANTOIN MONOHYD MACRO 100 MG PO CAPS: 100.0000 mg | ORAL_CAPSULE | Freq: Two times a day (BID) | ORAL | 0 refills | Status: AC

## 2016-07-08 NOTE — Telephone Encounter (Signed)
Typically, extending cipro is not going to be helpful I will recommend nitrofurantoin 100 mg BID PO x 5 days.please call it in Also, she needs to follow up with her PCP/urologist for long term UTI management

## 2016-07-08 NOTE — Telephone Encounter (Signed)
Pt states she was being treated for UTI and finished her antibiotic on Monday. She does not feel it is fully treated. She still has some frequency, a little discomfort and the urine is still a little cloudy. She is asking if she can have a refill of her antibiotic without coming back into the office.   4/30 had cipro 250 bid X7days called in.

## 2016-07-08 NOTE — Telephone Encounter (Signed)
Called with below message, verbalized understanding. States that she has a appointment with Urologist in June, said she will call them and see if she can have a earlier appt.

## 2016-07-14 ENCOUNTER — Encounter: Payer: Self-pay | Admitting: Gynecology

## 2016-07-14 ENCOUNTER — Other Ambulatory Visit: Payer: Self-pay | Admitting: Hematology and Oncology

## 2016-07-29 DIAGNOSIS — R29898 Other symptoms and signs involving the musculoskeletal system: Secondary | ICD-10-CM | POA: Diagnosis not present

## 2016-07-29 DIAGNOSIS — R2689 Other abnormalities of gait and mobility: Secondary | ICD-10-CM | POA: Diagnosis not present

## 2016-07-30 ENCOUNTER — Ambulatory Visit (HOSPITAL_BASED_OUTPATIENT_CLINIC_OR_DEPARTMENT_OTHER): Payer: Medicare Other | Admitting: Hematology and Oncology

## 2016-07-30 ENCOUNTER — Telehealth: Payer: Self-pay | Admitting: Hematology and Oncology

## 2016-07-30 ENCOUNTER — Ambulatory Visit (HOSPITAL_BASED_OUTPATIENT_CLINIC_OR_DEPARTMENT_OTHER): Payer: Medicare Other

## 2016-07-30 ENCOUNTER — Ambulatory Visit: Payer: Medicare Other

## 2016-07-30 ENCOUNTER — Other Ambulatory Visit (HOSPITAL_BASED_OUTPATIENT_CLINIC_OR_DEPARTMENT_OTHER): Payer: Medicare Other

## 2016-07-30 ENCOUNTER — Encounter: Payer: Self-pay | Admitting: Hematology and Oncology

## 2016-07-30 VITALS — BP 147/66 | HR 91 | Temp 98.6°F | Resp 18

## 2016-07-30 VITALS — BP 141/58 | HR 85 | Temp 98.6°F | Resp 18 | Ht 66.5 in | Wt 143.5 lb

## 2016-07-30 DIAGNOSIS — C8591 Non-Hodgkin lymphoma, unspecified, lymph nodes of head, face, and neck: Secondary | ICD-10-CM | POA: Diagnosis not present

## 2016-07-30 DIAGNOSIS — D801 Nonfamilial hypogammaglobulinemia: Secondary | ICD-10-CM

## 2016-07-30 DIAGNOSIS — C50211 Malignant neoplasm of upper-inner quadrant of right female breast: Secondary | ICD-10-CM

## 2016-07-30 DIAGNOSIS — D638 Anemia in other chronic diseases classified elsewhere: Secondary | ICD-10-CM

## 2016-07-30 DIAGNOSIS — Z170 Estrogen receptor positive status [ER+]: Secondary | ICD-10-CM

## 2016-07-30 LAB — CBC WITH DIFFERENTIAL/PLATELET
BASO%: 0.5 % (ref 0.0–2.0)
Basophils Absolute: 0 10*3/uL (ref 0.0–0.1)
EOS%: 1.6 % (ref 0.0–7.0)
Eosinophils Absolute: 0.1 10*3/uL (ref 0.0–0.5)
HEMATOCRIT: 29.8 % — AB (ref 34.8–46.6)
HEMOGLOBIN: 9.9 g/dL — AB (ref 11.6–15.9)
LYMPH#: 1 10*3/uL (ref 0.9–3.3)
LYMPH%: 12.5 % — ABNORMAL LOW (ref 14.0–49.7)
MCH: 27.1 pg (ref 25.1–34.0)
MCHC: 33.4 g/dL (ref 31.5–36.0)
MCV: 81.2 fL (ref 79.5–101.0)
MONO#: 0.7 10*3/uL (ref 0.1–0.9)
MONO%: 8.7 % (ref 0.0–14.0)
NEUT#: 6.4 10*3/uL (ref 1.5–6.5)
NEUT%: 76.7 % (ref 38.4–76.8)
PLATELETS: 247 10*3/uL (ref 145–400)
RBC: 3.67 10*6/uL — ABNORMAL LOW (ref 3.70–5.45)
RDW: 16.7 % — AB (ref 11.2–14.5)
WBC: 8.3 10*3/uL (ref 3.9–10.3)

## 2016-07-30 LAB — COMPREHENSIVE METABOLIC PANEL
ALBUMIN: 4.1 g/dL (ref 3.5–5.0)
ALK PHOS: 83 U/L (ref 40–150)
ALT: 12 U/L (ref 0–55)
ANION GAP: 12 meq/L — AB (ref 3–11)
AST: 14 U/L (ref 5–34)
BILIRUBIN TOTAL: 0.5 mg/dL (ref 0.20–1.20)
BUN: 16 mg/dL (ref 7.0–26.0)
CO2: 24 meq/L (ref 22–29)
Calcium: 10.1 mg/dL (ref 8.4–10.4)
Chloride: 104 mEq/L (ref 98–109)
Creatinine: 0.8 mg/dL (ref 0.6–1.1)
EGFR: 74 mL/min/{1.73_m2} — AB (ref >90)
GLUCOSE: 153 mg/dL — AB (ref 70–140)
POTASSIUM: 3.8 meq/L (ref 3.5–5.1)
SODIUM: 139 meq/L (ref 136–145)
TOTAL PROTEIN: 7 g/dL (ref 6.4–8.3)

## 2016-07-30 MED ORDER — IMMUNE GLOBULIN (HUMAN) 10 GM/100ML IV SOLN
400.0000 mg/kg | Freq: Once | INTRAVENOUS | Status: AC
  Administered 2016-07-30: 25 g via INTRAVENOUS
  Filled 2016-07-30: qty 200

## 2016-07-30 MED ORDER — HEPARIN SOD (PORK) LOCK FLUSH 100 UNIT/ML IV SOLN
500.0000 [IU] | Freq: Once | INTRAVENOUS | Status: AC | PRN
  Administered 2016-07-30: 500 [IU] via INTRAVENOUS
  Filled 2016-07-30: qty 5

## 2016-07-30 MED ORDER — SODIUM CHLORIDE 0.9 % IJ SOLN
10.0000 mL | INTRAMUSCULAR | Status: DC | PRN
  Administered 2016-07-30: 10 mL via INTRAVENOUS
  Filled 2016-07-30: qty 10

## 2016-07-30 MED ORDER — DIPHENHYDRAMINE HCL 25 MG PO CAPS
ORAL_CAPSULE | ORAL | Status: AC
  Filled 2016-07-30: qty 1

## 2016-07-30 MED ORDER — DIPHENHYDRAMINE HCL 25 MG PO CAPS
25.0000 mg | ORAL_CAPSULE | Freq: Once | ORAL | Status: AC
  Administered 2016-07-30: 25 mg via ORAL

## 2016-07-30 MED ORDER — DEXAMETHASONE SODIUM PHOSPHATE 10 MG/ML IJ SOLN
INTRAMUSCULAR | Status: AC
  Filled 2016-07-30: qty 1

## 2016-07-30 MED ORDER — DEXAMETHASONE SODIUM PHOSPHATE 10 MG/ML IJ SOLN
5.0000 mg | Freq: Once | INTRAMUSCULAR | Status: AC
  Administered 2016-07-30: 5 mg via INTRAVENOUS

## 2016-07-30 MED ORDER — SODIUM CHLORIDE 0.9 % IV SOLN
INTRAVENOUS | Status: DC
  Administered 2016-07-30: 11:00:00 via INTRAVENOUS

## 2016-07-30 NOTE — Patient Instructions (Signed)

## 2016-07-30 NOTE — Progress Notes (Signed)
[facility] OFFICE PROGRESS NOTE  Patient Care Team: Deland Pretty, MD as PCP - General (Internal Medicine) Adrian Prows, MD as Consulting Physician (Cardiology) Heath Lark, MD as Consulting Physician (Hematology and Oncology) Fanny Skates, MD as Consulting Physician (General Surgery) Eppie Gibson, MD as Attending Physician (Radiation Oncology) Sylvan Cheese, NP as Nurse Practitioner (Hematology and Oncology) Thompson Grayer, MD as Consulting Physician (Cardiology) Lahoma Rocker, MD as Consulting Physician (Rheumatology) Terrance Mass, MD as Consulting Physician (Gynecology)  SUMMARY OF ONCOLOGIC HISTORY: Oncology History   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      01/03/2014 Bone Marrow Biopsy    Accession: [id number] Bone marrow biopsy is negative      01/04/2014 Imaging    ECHO showed normal EF      01/04/2014 Procedure    She has placement of port      01/09/2014 - 03/07/2014 Chemotherapy    She was given treatment with bendamustine with rituximab. Treatment was stopped due to severe side-effects despite significant dose adjustment for cycle 2      01/18/2014 - 02/01/2014 Hospital Admission    She was admitted to the hospital from Escherichia coli sepsis with multiorgan failure and brief episodes  of intubation. She was discharged to skilled nursing facility      02/26/2014 Imaging    PET/CT scan showed near complete remission      02/27/2014 Adverse Reaction    Cycle 2 of treatment was resumed with drastic dose adjustment to bendamustine due to recent multi-organ failure      04/03/2014 - 03/13/2015 Chemotherapy    She is started on maintenance rituximab only.      04/09/2014 - 04/12/2014 Hospital Admission    The patient was admitted to the hospital with urinary tract infection and sepsis.      06/05/2014 Imaging    PET CT scan showed complete response to Rx      02/12/2015 Imaging    Ct scan showed no evidence of disease. It shows she has new pneumonia      04/02/2015 Miscellaneous    She received 1 dose IVIG complicated by mild infusion reaction      04/26/2015 - 04/30/2015 Hospital Admission    She had recurrent admission to the hospital with sepsis      10/22/2015 Imaging    CT scan of chest and abdomen showed Ill-defined tissue in the porta hepatis suggest treated lymphoma. No evidence of lymphadenopathy in the chest, abdomen & pelvis to suggest recurrent lymphoma.       Breast cancer of upper-inner quadrant of right female breast ([place])   11/06/2014 Imaging    DEXA scan showed osteopenia T-1.9 on bilateral femoral neck  03/31/2015 Imaging    Screening mammogram showed suspicious lesion, confirmed on diagnostic imaging at 2 and 230 position on the right breast      04/10/2015 Pathology Results    Accession: [id number] breast biopsy in 2 locations came back invasive ductal carcinoma with calcification, 100% ER and PR positive, HER 2 neg      04/10/2015 Clinical Stage    Stage IA: T1 N0      05/27/2015 Surgery    Right total mastectomy: multifocal IDC, 1.5 and 1.0 cm, neg margins, ER+ (100% - both); PR+ (100% and 95%), HER2neu negative (ratio 1.69 and 1.14) Ki67 2% and 10%. DCIS      05/27/2015 Pathologic Stage    Stage IA: mpT1c pNx pMx      06/17/2015  Survivorship    Survivorship care plan mailed to patient at her request      07/14/2015 -  Anti-estrogen oral therapy    She started taking Arimidex      04/01/2016 Imaging    No mammographic evidence of malignancy in the left breast.       INTERVAL HISTORY: Please see below for problem oriented charting. She returns with her husband. Her UTI symptoms had resolved She denies new lymphadenopathy She tolerated Arimidex well She denies recent chest pain, shortness of breath, nausea vomiting  REVIEW OF SYSTEMS:   Constitutional: Denies fevers, chills or abnormal weight loss Eyes: Denies blurriness of vision Ears, nose, mouth, throat, and face: Denies mucositis or sore throat Respiratory: Denies cough, dyspnea or wheezes Cardiovascular: Denies palpitation, chest discomfort or lower extremity swelling Gastrointestinal:  Denies nausea, heartburn or change in bowel habits Skin: Denies abnormal skin rashes Lymphatics: Denies new lymphadenopathy or easy bruising Neurological:Denies numbness, tingling or new weaknesses Behavioral/Psych: Mood is stable, no new changes  All other systems were reviewed with the patient and are negative.  I have reviewed the past medical history, past surgical history, social history and family history with the patient and they are unchanged from previous note.  ALLERGIES:  is allergic to morphine and related; multaq [dronedarone]; demerol; oysters [shellfish allergy]; penicillins; and sulfa drugs cross reactors.  MEDICATIONS:  Current Outpatient Prescriptions  Medication Sig Dispense Refill  . acetaminophen (TYLENOL) 500 MG tablet Take 1,000 mg by mouth every 6 (six) hours as needed for moderate pain, fever or headache.    . albuterol (PROVENTIL HFA;VENTOLIN HFA) 108 (90 Base) MCG/ACT inhaler Inhale into the lungs as directed.    Marland Kitchen albuterol (PROVENTIL) (2.5 MG/3ML) 0.083% nebulizer solution Inhale 3 mLs into the lungs every 6 (six) hours as needed for  wheezing or shortness of breath.     . anastrozole (ARIMIDEX) 1 MG tablet TAKE ONE TABLET BY MOUTH EVERY DAY 90 tablet 9  . Cholecalciferol (VITAMIN D) 2000 UNITS tablet Take 2,000 Units by mouth daily with lunch.    . diclofenac sodium (VOLTAREN) 1 % GEL Apply 1 application topically 4 (four) times daily as needed.    . Fluticasone-Salmeterol (ADVAIR) 250-50 MCG/DOSE AEPB Inhale 2 puffs into the lungs every 12 (twelve) hours.     . hydrochlorothiazide (HYDRODIURIL) 25 MG tablet Take 37.5 mg by mouth daily as needed (as needed for leg swelling).    Marland Kitchen levothyroxine (SYNTHROID, LEVOTHROID) 200 MCG tablet Take 200 mcg by mouth daily before breakfast.    . lidocaine-prilocaine (EMLA) cream Apply 1 application topically as needed. Apply to [facility] a Cath site at least one hour prior to needle stick as needed. 30 g 10  .  Magnesium Oxide 500 MG TABS Take 500 mg by mouth daily.     . metFORMIN (GLUCOPHAGE-XR) 500 MG 24 hr tablet Take 500 mg by mouth 2 (two) times daily with a meal.    . montelukast (SINGULAIR) 10 MG tablet Take 10 mg by mouth daily.    . Omega-3-acid Ethyl Esters (LOVAZA PO) Take 1 capsule by mouth 2 (two) times daily.    . pravastatin (PRAVACHOL) 20 MG tablet 1/2 tab mon-thurs    . rivaroxaban (XARELTO) 20 MG TABS tablet Take 20 mg by mouth daily with supper.    . vitamin B-12 (CYANOCOBALAMIN) 500 MCG tablet Take 500 mcg by mouth daily.    . ondansetron (ZOFRAN) 8 MG tablet Take 1 tablet (8 mg total) by mouth every 8 (eight) hours as needed for nausea. (Patient not taking: Reported on 07/30/2016) 60 tablet 3  . traMADol-acetaminophen (ULTRACET) 37.5-325 MG tablet Take 1 tablet by mouth every 6 (six) hours as needed (Pain).     No current facility-administered medications for this visit.     PHYSICAL EXAMINATION: ECOG PERFORMANCE STATUS: 1 - Symptomatic but completely ambulatory  Vitals:   07/30/16 0951  BP: (!) 141/58  Pulse: 85  Resp: 18  Temp: 98.6 F (37 C)   Filed Weights    07/30/16 0951  Weight: 143 lb 8 oz (65.1 kg)    GENERAL:alert, no distress and comfortable SKIN: skin color, texture, turgor are normal, no rashes or significant lesions EYES: normal, Conjunctiva are pink and non-injected, sclera clear OROPHARYNX:no exudate, no erythema and lips, buccal mucosa, and tongue normal  NECK: supple, thyroid normal size, non-tender, without nodularity LYMPH:  no palpable lymphadenopathy in the cervical, axillary or inguinal LUNGS: clear to auscultation and percussion with normal breathing effort HEART: regular rate & rhythm and no murmurs and no lower extremity edema ABDOMEN:abdomen soft, non-tender and normal bowel sounds Musculoskeletal:no cyanosis of digits and no clubbing  NEURO: alert & oriented x 3 with fluent speech, no focal motor/sensory deficits  LABORATORY DATA:  I have reviewed the data as listed    Component Value Date/Time   NA 139 07/30/2016 0925   K 3.8 07/30/2016 0925   CL 108 08/20/2015 0430   CO2 24 07/30/2016 0925   GLUCOSE 153 (H) 07/30/2016 0925   BUN 16.0 07/30/2016 0925   CREATININE 0.8 07/30/2016 0925   CALCIUM 10.1 07/30/2016 0925   PROT 7.0 07/30/2016 0925   ALBUMIN 4.1 07/30/2016 0925   AST 14 07/30/2016 0925   ALT 12 07/30/2016 0925   ALKPHOS 83 07/30/2016 0925   BILITOT 0.50 07/30/2016 0925   GFRNONAA >60 08/20/2015 0430   GFRAA >60 08/20/2015 0430    No results found for: SPEP, UPEP  Lab Results  Component Value Date   WBC 8.3 07/30/2016   NEUTROABS 6.4 07/30/2016   HGB 9.9 (L) 07/30/2016   HCT 29.8 (L) 07/30/2016   MCV 81.2 07/30/2016   PLT 247 07/30/2016      Chemistry      Component Value Date/Time   NA 139 07/30/2016 0925   K 3.8 07/30/2016 0925   CL 108 08/20/2015 0430   CO2 24 07/30/2016 0925   BUN 16.0 07/30/2016 0925   CREATININE 0.8 07/30/2016 0925      Component Value Date/Time   CALCIUM 10.1 07/30/2016 0925   ALKPHOS 83 07/30/2016 0925   AST 14 07/30/2016 0925   ALT 12 07/30/2016 0925    BILITOT 0.50 07/30/2016 0925  ASSESSMENT & PLAN:  Breast cancer of upper-inner quadrant of right female breast [facility]) She is taking Arimidex since 07/14/2015 as part of the adjuvant treatment for breast cancer. So far, she tolerated treatment well without any side effects. 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 I recommend IVIG monthly and I'll see her back in 2 months  Anemia in chronic illness This is likely due to recent treatment and anemia of chronic disease. The patient denies recent history of bleeding such as epistaxis, hematuria or hematochezia. She is asymptomatic from the anemia. I will observe for now.    Orders Placed This Encounter  Procedures  . CT CHEST W CONTRAST    Standing Status:   Future    Standing Expiration Date:   09/29/2017    Order Specific Question:   Reason for Exam (SYMPTOM  OR DIAGNOSIS REQUIRED)    Answer:   staging lymphoma    Order Specific Question:   Preferred imaging location?    Answer:   [facility]  . CT ABDOMEN PELVIS W CONTRAST    Standing Status:   Future    Standing Expiration Date:   10/30/2017    Order Specific Question:   Reason for Exam (SYMPTOM  OR DIAGNOSIS REQUIRED)    Answer:   staging lymphoma    Order Specific Question:   Preferred imaging location?    Answer:   [facility]   All questions were answered. The patient knows to call the clinic with any problems, questions or concerns. No barriers to learning was detected. I spent 20 minutes counseling the patient  face to face. The total time spent in the appointment was 25 minutes and more than 50% was on counseling and review of test results     Heath Lark, MD 07/30/2016 6:28 PM

## 2016-07-30 NOTE — Assessment & Plan Note (Signed)
This is likely due to recent treatment and anemia of chronic disease. The patient denies recent history of bleeding such as epistaxis, hematuria or hematochezia. She is asymptomatic from the anemia. I will observe for now.  

## 2016-07-30 NOTE — Telephone Encounter (Signed)
Gave patient AVS and calender per 6/1 LOS.

## 2016-07-30 NOTE — Assessment & Plan Note (Signed)
This is due to rituximab. I recommend IVIG monthly and I'll see her back in 2 months

## 2016-07-30 NOTE — Patient Instructions (Signed)

## 2016-07-30 NOTE — Assessment & Plan Note (Signed)
Repeat imaging study August 2017 showed no evidence of recurrence She has no signs of disease recurrence clinically I will repeat imaging study in August 2018

## 2016-07-30 NOTE — Assessment & Plan Note (Signed)
She is taking Arimidex since 07/14/2015 as part of the adjuvant treatment for breast cancer. So far, she tolerated treatment well without any side effects. I will continue to see her on the regular basis including breast examination and will make sure that she get regular screening mammogram Her recent screening mammogram in February 2018 was normal

## 2016-08-03 DIAGNOSIS — R2689 Other abnormalities of gait and mobility: Secondary | ICD-10-CM | POA: Diagnosis not present

## 2016-08-03 DIAGNOSIS — R29898 Other symptoms and signs involving the musculoskeletal system: Secondary | ICD-10-CM | POA: Diagnosis not present

## 2016-08-05 DIAGNOSIS — M5127 Other intervertebral disc displacement, lumbosacral region: Secondary | ICD-10-CM | POA: Diagnosis not present

## 2016-08-06 DIAGNOSIS — R2689 Other abnormalities of gait and mobility: Secondary | ICD-10-CM | POA: Diagnosis not present

## 2016-08-06 DIAGNOSIS — R29898 Other symptoms and signs involving the musculoskeletal system: Secondary | ICD-10-CM | POA: Diagnosis not present

## 2016-08-09 DIAGNOSIS — R2689 Other abnormalities of gait and mobility: Secondary | ICD-10-CM | POA: Diagnosis not present

## 2016-08-09 DIAGNOSIS — R29898 Other symptoms and signs involving the musculoskeletal system: Secondary | ICD-10-CM | POA: Diagnosis not present

## 2016-08-11 DIAGNOSIS — N312 Flaccid neuropathic bladder, not elsewhere classified: Secondary | ICD-10-CM | POA: Diagnosis not present

## 2016-08-11 DIAGNOSIS — N3020 Other chronic cystitis without hematuria: Secondary | ICD-10-CM | POA: Diagnosis not present

## 2016-08-12 DIAGNOSIS — R2689 Other abnormalities of gait and mobility: Secondary | ICD-10-CM | POA: Diagnosis not present

## 2016-08-12 DIAGNOSIS — R29898 Other symptoms and signs involving the musculoskeletal system: Secondary | ICD-10-CM | POA: Diagnosis not present

## 2016-08-17 DIAGNOSIS — R2689 Other abnormalities of gait and mobility: Secondary | ICD-10-CM | POA: Diagnosis not present

## 2016-08-17 DIAGNOSIS — R29898 Other symptoms and signs involving the musculoskeletal system: Secondary | ICD-10-CM | POA: Diagnosis not present

## 2016-08-19 DIAGNOSIS — R2689 Other abnormalities of gait and mobility: Secondary | ICD-10-CM | POA: Diagnosis not present

## 2016-08-19 DIAGNOSIS — R29898 Other symptoms and signs involving the musculoskeletal system: Secondary | ICD-10-CM | POA: Diagnosis not present

## 2016-08-24 DIAGNOSIS — R2689 Other abnormalities of gait and mobility: Secondary | ICD-10-CM | POA: Diagnosis not present

## 2016-08-24 DIAGNOSIS — R29898 Other symptoms and signs involving the musculoskeletal system: Secondary | ICD-10-CM | POA: Diagnosis not present

## 2016-08-26 ENCOUNTER — Ambulatory Visit (HOSPITAL_BASED_OUTPATIENT_CLINIC_OR_DEPARTMENT_OTHER): Payer: Medicare Other

## 2016-08-26 VITALS — BP 134/69 | HR 84 | Temp 98.2°F | Resp 18

## 2016-08-26 DIAGNOSIS — D801 Nonfamilial hypogammaglobulinemia: Secondary | ICD-10-CM

## 2016-08-26 DIAGNOSIS — C8591 Non-Hodgkin lymphoma, unspecified, lymph nodes of head, face, and neck: Secondary | ICD-10-CM

## 2016-08-26 MED ORDER — IMMUNE GLOBULIN (HUMAN) 10 GM/100ML IV SOLN
25.0000 g | Freq: Once | INTRAVENOUS | Status: AC
  Administered 2016-08-26: 25 g via INTRAVENOUS
  Filled 2016-08-26: qty 200

## 2016-08-26 MED ORDER — DEXAMETHASONE SODIUM PHOSPHATE 10 MG/ML IJ SOLN
INTRAMUSCULAR | Status: AC
  Filled 2016-08-26: qty 1

## 2016-08-26 MED ORDER — DIPHENHYDRAMINE HCL 25 MG PO CAPS
25.0000 mg | ORAL_CAPSULE | Freq: Once | ORAL | Status: AC
  Administered 2016-08-26: 25 mg via ORAL

## 2016-08-26 MED ORDER — HEPARIN SOD (PORK) LOCK FLUSH 100 UNIT/ML IV SOLN
500.0000 [IU] | Freq: Once | INTRAVENOUS | Status: AC | PRN
  Administered 2016-08-26: 500 [IU] via INTRAVENOUS
  Filled 2016-08-26: qty 5

## 2016-08-26 MED ORDER — DEXAMETHASONE SODIUM PHOSPHATE 10 MG/ML IJ SOLN
5.0000 mg | Freq: Once | INTRAMUSCULAR | Status: AC
  Administered 2016-08-26: 5 mg via INTRAVENOUS

## 2016-08-26 MED ORDER — SODIUM CHLORIDE 0.9 % IJ SOLN
10.0000 mL | INTRAMUSCULAR | Status: DC | PRN
  Administered 2016-08-26: 10 mL via INTRAVENOUS
  Filled 2016-08-26: qty 10

## 2016-08-26 MED ORDER — DIPHENHYDRAMINE HCL 25 MG PO CAPS
ORAL_CAPSULE | ORAL | Status: AC
  Filled 2016-08-26: qty 1

## 2016-08-26 NOTE — Progress Notes (Signed)
Pt declined to stay post 30-minute observation

## 2016-08-26 NOTE — Patient Instructions (Signed)

## 2016-08-27 DIAGNOSIS — R2689 Other abnormalities of gait and mobility: Secondary | ICD-10-CM | POA: Diagnosis not present

## 2016-08-27 DIAGNOSIS — R29898 Other symptoms and signs involving the musculoskeletal system: Secondary | ICD-10-CM | POA: Diagnosis not present

## 2016-08-31 DIAGNOSIS — R2689 Other abnormalities of gait and mobility: Secondary | ICD-10-CM | POA: Diagnosis not present

## 2016-08-31 DIAGNOSIS — R29898 Other symptoms and signs involving the musculoskeletal system: Secondary | ICD-10-CM | POA: Diagnosis not present

## 2016-09-02 DIAGNOSIS — R29898 Other symptoms and signs involving the musculoskeletal system: Secondary | ICD-10-CM | POA: Diagnosis not present

## 2016-09-02 DIAGNOSIS — R2689 Other abnormalities of gait and mobility: Secondary | ICD-10-CM | POA: Diagnosis not present

## 2016-09-07 DIAGNOSIS — R2689 Other abnormalities of gait and mobility: Secondary | ICD-10-CM | POA: Diagnosis not present

## 2016-09-07 DIAGNOSIS — R29898 Other symptoms and signs involving the musculoskeletal system: Secondary | ICD-10-CM | POA: Diagnosis not present

## 2016-09-09 DIAGNOSIS — R2689 Other abnormalities of gait and mobility: Secondary | ICD-10-CM | POA: Diagnosis not present

## 2016-09-09 DIAGNOSIS — R29898 Other symptoms and signs involving the musculoskeletal system: Secondary | ICD-10-CM | POA: Diagnosis not present

## 2016-09-13 DIAGNOSIS — R29898 Other symptoms and signs involving the musculoskeletal system: Secondary | ICD-10-CM | POA: Diagnosis not present

## 2016-09-13 DIAGNOSIS — R2689 Other abnormalities of gait and mobility: Secondary | ICD-10-CM | POA: Diagnosis not present

## 2016-09-17 DIAGNOSIS — Z9889 Other specified postprocedural states: Secondary | ICD-10-CM | POA: Diagnosis not present

## 2016-09-17 DIAGNOSIS — Z8679 Personal history of other diseases of the circulatory system: Secondary | ICD-10-CM | POA: Diagnosis not present

## 2016-09-17 DIAGNOSIS — I480 Paroxysmal atrial fibrillation: Secondary | ICD-10-CM | POA: Diagnosis not present

## 2016-09-17 DIAGNOSIS — I440 Atrioventricular block, first degree: Secondary | ICD-10-CM | POA: Diagnosis not present

## 2016-09-20 DIAGNOSIS — R2689 Other abnormalities of gait and mobility: Secondary | ICD-10-CM | POA: Diagnosis not present

## 2016-09-20 DIAGNOSIS — R29898 Other symptoms and signs involving the musculoskeletal system: Secondary | ICD-10-CM | POA: Diagnosis not present

## 2016-09-23 ENCOUNTER — Other Ambulatory Visit (HOSPITAL_BASED_OUTPATIENT_CLINIC_OR_DEPARTMENT_OTHER): Payer: Medicare Other

## 2016-09-23 ENCOUNTER — Ambulatory Visit (HOSPITAL_BASED_OUTPATIENT_CLINIC_OR_DEPARTMENT_OTHER): Payer: Medicare Other

## 2016-09-23 ENCOUNTER — Ambulatory Visit: Payer: Medicare Other

## 2016-09-23 VITALS — BP 138/73 | HR 70 | Temp 98.5°F | Resp 18

## 2016-09-23 DIAGNOSIS — D801 Nonfamilial hypogammaglobulinemia: Secondary | ICD-10-CM

## 2016-09-23 DIAGNOSIS — C8591 Non-Hodgkin lymphoma, unspecified, lymph nodes of head, face, and neck: Secondary | ICD-10-CM

## 2016-09-23 LAB — CBC WITH DIFFERENTIAL/PLATELET
BASO%: 0.7 % (ref 0.0–2.0)
Basophils Absolute: 0.1 10*3/uL (ref 0.0–0.1)
EOS%: 1.6 % (ref 0.0–7.0)
Eosinophils Absolute: 0.1 10*3/uL (ref 0.0–0.5)
HEMATOCRIT: 31.1 % — AB (ref 34.8–46.6)
HEMOGLOBIN: 10.5 g/dL — AB (ref 11.6–15.9)
LYMPH#: 1.3 10*3/uL (ref 0.9–3.3)
LYMPH%: 17.5 % (ref 14.0–49.7)
MCH: 27.5 pg (ref 25.1–34.0)
MCHC: 33.7 g/dL (ref 31.5–36.0)
MCV: 81.7 fL (ref 79.5–101.0)
MONO#: 0.7 10*3/uL (ref 0.1–0.9)
MONO%: 9.9 % (ref 0.0–14.0)
NEUT#: 5.2 10*3/uL (ref 1.5–6.5)
NEUT%: 70.3 % (ref 38.4–76.8)
PLATELETS: 259 10*3/uL (ref 145–400)
RBC: 3.81 10*6/uL (ref 3.70–5.45)
RDW: 16.6 % — AB (ref 11.2–14.5)
WBC: 7.4 10*3/uL (ref 3.9–10.3)

## 2016-09-23 LAB — COMPREHENSIVE METABOLIC PANEL
ALBUMIN: 3.9 g/dL (ref 3.5–5.0)
ALK PHOS: 72 U/L (ref 40–150)
ALT: 14 U/L (ref 0–55)
ANION GAP: 11 meq/L (ref 3–11)
AST: 15 U/L (ref 5–34)
BUN: 14.7 mg/dL (ref 7.0–26.0)
CO2: 24 meq/L (ref 22–29)
Calcium: 9.7 mg/dL (ref 8.4–10.4)
Chloride: 104 mEq/L (ref 98–109)
Creatinine: 0.8 mg/dL (ref 0.6–1.1)
EGFR: 73 mL/min/{1.73_m2} — AB (ref >90)
GLUCOSE: 137 mg/dL (ref 70–140)
POTASSIUM: 4.1 meq/L (ref 3.5–5.1)
SODIUM: 139 meq/L (ref 136–145)
Total Bilirubin: 0.48 mg/dL (ref 0.20–1.20)
Total Protein: 6.9 g/dL (ref 6.4–8.3)

## 2016-09-23 MED ORDER — HEPARIN SOD (PORK) LOCK FLUSH 100 UNIT/ML IV SOLN
500.0000 [IU] | Freq: Once | INTRAVENOUS | Status: AC | PRN
  Administered 2016-09-23: 500 [IU] via INTRAVENOUS
  Filled 2016-09-23: qty 5

## 2016-09-23 MED ORDER — DEXAMETHASONE SODIUM PHOSPHATE 10 MG/ML IJ SOLN
INTRAMUSCULAR | Status: AC
  Filled 2016-09-23: qty 1

## 2016-09-23 MED ORDER — DEXAMETHASONE SODIUM PHOSPHATE 10 MG/ML IJ SOLN
5.0000 mg | Freq: Once | INTRAMUSCULAR | Status: AC
  Administered 2016-09-23: 5 mg via INTRAVENOUS

## 2016-09-23 MED ORDER — SODIUM CHLORIDE 0.9 % IJ SOLN
10.0000 mL | INTRAMUSCULAR | Status: DC | PRN
  Administered 2016-09-23: 10 mL via INTRAVENOUS
  Filled 2016-09-23: qty 10

## 2016-09-23 MED ORDER — DIPHENHYDRAMINE HCL 25 MG PO CAPS
25.0000 mg | ORAL_CAPSULE | Freq: Once | ORAL | Status: AC
  Administered 2016-09-23: 25 mg via ORAL

## 2016-09-23 MED ORDER — DIPHENHYDRAMINE HCL 25 MG PO CAPS
ORAL_CAPSULE | ORAL | Status: AC
  Filled 2016-09-23: qty 1

## 2016-09-23 MED ORDER — IMMUNE GLOBULIN (HUMAN) 10 GM/100ML IV SOLN
25.0000 g | Freq: Once | INTRAVENOUS | Status: AC
  Administered 2016-09-23: 25 g via INTRAVENOUS
  Filled 2016-09-23: qty 50

## 2016-09-23 NOTE — Patient Instructions (Signed)

## 2016-09-24 DIAGNOSIS — R2689 Other abnormalities of gait and mobility: Secondary | ICD-10-CM | POA: Diagnosis not present

## 2016-09-24 DIAGNOSIS — R29898 Other symptoms and signs involving the musculoskeletal system: Secondary | ICD-10-CM | POA: Diagnosis not present

## 2016-09-28 DIAGNOSIS — R2689 Other abnormalities of gait and mobility: Secondary | ICD-10-CM | POA: Diagnosis not present

## 2016-09-28 DIAGNOSIS — R29898 Other symptoms and signs involving the musculoskeletal system: Secondary | ICD-10-CM | POA: Diagnosis not present

## 2016-09-30 DIAGNOSIS — R29898 Other symptoms and signs involving the musculoskeletal system: Secondary | ICD-10-CM | POA: Diagnosis not present

## 2016-09-30 DIAGNOSIS — R2689 Other abnormalities of gait and mobility: Secondary | ICD-10-CM | POA: Diagnosis not present

## 2016-10-04 DIAGNOSIS — Z0000 Encounter for general adult medical examination without abnormal findings: Secondary | ICD-10-CM | POA: Diagnosis not present

## 2016-10-04 DIAGNOSIS — E039 Hypothyroidism, unspecified: Secondary | ICD-10-CM | POA: Diagnosis not present

## 2016-10-04 DIAGNOSIS — E7800 Pure hypercholesterolemia, unspecified: Secondary | ICD-10-CM | POA: Diagnosis not present

## 2016-10-04 DIAGNOSIS — I10 Essential (primary) hypertension: Secondary | ICD-10-CM | POA: Diagnosis not present

## 2016-10-04 DIAGNOSIS — R29898 Other symptoms and signs involving the musculoskeletal system: Secondary | ICD-10-CM | POA: Diagnosis not present

## 2016-10-04 DIAGNOSIS — E11610 Type 2 diabetes mellitus with diabetic neuropathic arthropathy: Secondary | ICD-10-CM | POA: Diagnosis not present

## 2016-10-04 DIAGNOSIS — M810 Age-related osteoporosis without current pathological fracture: Secondary | ICD-10-CM | POA: Diagnosis not present

## 2016-10-04 DIAGNOSIS — R2689 Other abnormalities of gait and mobility: Secondary | ICD-10-CM | POA: Diagnosis not present

## 2016-10-05 DIAGNOSIS — D649 Anemia, unspecified: Secondary | ICD-10-CM | POA: Diagnosis not present

## 2016-10-07 DIAGNOSIS — G4730 Sleep apnea, unspecified: Secondary | ICD-10-CM | POA: Diagnosis not present

## 2016-10-07 DIAGNOSIS — M4317 Spondylolisthesis, lumbosacral region: Secondary | ICD-10-CM | POA: Diagnosis not present

## 2016-10-07 DIAGNOSIS — E11610 Type 2 diabetes mellitus with diabetic neuropathic arthropathy: Secondary | ICD-10-CM | POA: Diagnosis not present

## 2016-10-07 DIAGNOSIS — N390 Urinary tract infection, site not specified: Secondary | ICD-10-CM | POA: Diagnosis not present

## 2016-10-07 DIAGNOSIS — Z9989 Dependence on other enabling machines and devices: Secondary | ICD-10-CM | POA: Diagnosis not present

## 2016-10-11 DIAGNOSIS — M1180 Other specified crystal arthropathies, unspecified site: Secondary | ICD-10-CM | POA: Diagnosis not present

## 2016-10-11 DIAGNOSIS — M109 Gout, unspecified: Secondary | ICD-10-CM | POA: Diagnosis not present

## 2016-10-11 DIAGNOSIS — I10 Essential (primary) hypertension: Secondary | ICD-10-CM | POA: Diagnosis not present

## 2016-10-11 DIAGNOSIS — I480 Paroxysmal atrial fibrillation: Secondary | ICD-10-CM | POA: Diagnosis not present

## 2016-10-11 DIAGNOSIS — Z8679 Personal history of other diseases of the circulatory system: Secondary | ICD-10-CM | POA: Diagnosis not present

## 2016-10-11 DIAGNOSIS — Z9889 Other specified postprocedural states: Secondary | ICD-10-CM | POA: Diagnosis not present

## 2016-10-11 DIAGNOSIS — M170 Bilateral primary osteoarthritis of knee: Secondary | ICD-10-CM | POA: Diagnosis not present

## 2016-10-12 DIAGNOSIS — R29898 Other symptoms and signs involving the musculoskeletal system: Secondary | ICD-10-CM | POA: Diagnosis not present

## 2016-10-12 DIAGNOSIS — R2689 Other abnormalities of gait and mobility: Secondary | ICD-10-CM | POA: Diagnosis not present

## 2016-10-15 DIAGNOSIS — R2689 Other abnormalities of gait and mobility: Secondary | ICD-10-CM | POA: Diagnosis not present

## 2016-10-15 DIAGNOSIS — R29898 Other symptoms and signs involving the musculoskeletal system: Secondary | ICD-10-CM | POA: Diagnosis not present

## 2016-10-15 DIAGNOSIS — Z1212 Encounter for screening for malignant neoplasm of rectum: Secondary | ICD-10-CM | POA: Diagnosis not present

## 2016-10-19 DIAGNOSIS — R29898 Other symptoms and signs involving the musculoskeletal system: Secondary | ICD-10-CM | POA: Diagnosis not present

## 2016-10-19 DIAGNOSIS — R2689 Other abnormalities of gait and mobility: Secondary | ICD-10-CM | POA: Diagnosis not present

## 2016-10-20 ENCOUNTER — Ambulatory Visit: Payer: Medicare Other

## 2016-10-20 ENCOUNTER — Other Ambulatory Visit (HOSPITAL_BASED_OUTPATIENT_CLINIC_OR_DEPARTMENT_OTHER): Payer: Medicare Other

## 2016-10-20 ENCOUNTER — Ambulatory Visit (HOSPITAL_COMMUNITY)
Admission: RE | Admit: 2016-10-20 | Discharge: 2016-10-20 | Disposition: A | Discharge status: Home / Self Care | Payer: Medicare Other | Source: Ambulatory Visit | Attending: Hematology and Oncology | Admitting: Hematology and Oncology

## 2016-10-20 DIAGNOSIS — Z170 Estrogen receptor positive status [ER+]: Secondary | ICD-10-CM | POA: Diagnosis not present

## 2016-10-20 DIAGNOSIS — D801 Nonfamilial hypogammaglobulinemia: Secondary | ICD-10-CM

## 2016-10-20 DIAGNOSIS — C8591 Non-Hodgkin lymphoma, unspecified, lymph nodes of head, face, and neck: Secondary | ICD-10-CM | POA: Diagnosis not present

## 2016-10-20 DIAGNOSIS — C50211 Malignant neoplasm of upper-inner quadrant of right female breast: Secondary | ICD-10-CM | POA: Insufficient documentation

## 2016-10-20 DIAGNOSIS — C8290 Follicular lymphoma, unspecified, unspecified site: Secondary | ICD-10-CM | POA: Diagnosis not present

## 2016-10-20 DIAGNOSIS — I700 Atherosclerosis of aorta: Secondary | ICD-10-CM | POA: Diagnosis not present

## 2016-10-20 DIAGNOSIS — I2510 Atherosclerotic heart disease of native coronary artery without angina pectoris: Secondary | ICD-10-CM | POA: Insufficient documentation

## 2016-10-20 DIAGNOSIS — I480 Paroxysmal atrial fibrillation: Secondary | ICD-10-CM | POA: Diagnosis not present

## 2016-10-20 LAB — CBC WITH DIFFERENTIAL/PLATELET
BASO%: 0.5 % (ref 0.0–2.0)
BASOS ABS: 0 10*3/uL (ref 0.0–0.1)
EOS ABS: 0.1 10*3/uL (ref 0.0–0.5)
EOS%: 1.7 % (ref 0.0–7.0)
HCT: 30.8 % — ABNORMAL LOW (ref 34.8–46.6)
HEMOGLOBIN: 9.9 g/dL — AB (ref 11.6–15.9)
LYMPH%: 19 % (ref 14.0–49.7)
MCH: 27.3 pg (ref 25.1–34.0)
MCHC: 32.1 g/dL (ref 31.5–36.0)
MCV: 85.1 fL (ref 79.5–101.0)
MONO#: 0.7 10*3/uL (ref 0.1–0.9)
MONO%: 9.8 % (ref 0.0–14.0)
NEUT#: 4.6 10*3/uL (ref 1.5–6.5)
NEUT%: 69 % (ref 38.4–76.8)
Platelets: 219 10*3/uL (ref 145–400)
RBC: 3.62 10*6/uL — ABNORMAL LOW (ref 3.70–5.45)
RDW: 16.2 % — ABNORMAL HIGH (ref 11.2–14.5)
WBC: 6.6 10*3/uL (ref 3.9–10.3)
lymph#: 1.3 10*3/uL (ref 0.9–3.3)

## 2016-10-20 LAB — COMPREHENSIVE METABOLIC PANEL
ALT: 13 U/L (ref 0–55)
AST: 15 U/L (ref 5–34)
Albumin: 4 g/dL (ref 3.5–5.0)
Alkaline Phosphatase: 75 U/L (ref 40–150)
Anion Gap: 9 mEq/L (ref 3–11)
BUN: 12.2 mg/dL (ref 7.0–26.0)
CHLORIDE: 105 meq/L (ref 98–109)
CO2: 25 meq/L (ref 22–29)
CREATININE: 0.8 mg/dL (ref 0.6–1.1)
Calcium: 10.2 mg/dL (ref 8.4–10.4)
EGFR: 75 mL/min/{1.73_m2} — ABNORMAL LOW (ref >90)
GLUCOSE: 122 mg/dL (ref 70–140)
Potassium: 3.9 mEq/L (ref 3.5–5.1)
Sodium: 138 mEq/L (ref 136–145)
Total Bilirubin: 0.64 mg/dL (ref 0.20–1.20)
Total Protein: 7.2 g/dL (ref 6.4–8.3)

## 2016-10-20 MED ORDER — HEPARIN SOD (PORK) LOCK FLUSH 100 UNIT/ML IV SOLN
500.0000 [IU] | Freq: Once | INTRAVENOUS | Status: AC | PRN
  Administered 2016-10-20: 500 [IU] via INTRAVENOUS
  Filled 2016-10-20: qty 5

## 2016-10-20 MED ORDER — SODIUM CHLORIDE 0.9 % IJ SOLN
10.0000 mL | INTRAMUSCULAR | Status: DC | PRN
  Administered 2016-10-20: 10 mL via INTRAVENOUS
  Filled 2016-10-20: qty 10

## 2016-10-20 MED ORDER — IOPAMIDOL (ISOVUE-300) INJECTION 61%
100.0000 mL | Freq: Once | INTRAVENOUS | Status: AC | PRN
  Administered 2016-10-20: 100 mL via INTRAVENOUS

## 2016-10-20 MED ORDER — IOPAMIDOL (ISOVUE-300) INJECTION 61%
INTRAVENOUS | Status: AC
  Filled 2016-10-20: qty 100

## 2016-10-20 NOTE — Patient Instructions (Signed)

## 2016-10-21 ENCOUNTER — Encounter: Payer: Self-pay | Admitting: Hematology and Oncology

## 2016-10-21 ENCOUNTER — Telehealth: Payer: Self-pay | Admitting: Hematology and Oncology

## 2016-10-21 ENCOUNTER — Ambulatory Visit (HOSPITAL_BASED_OUTPATIENT_CLINIC_OR_DEPARTMENT_OTHER): Payer: Medicare Other | Admitting: Hematology and Oncology

## 2016-10-21 DIAGNOSIS — C50211 Malignant neoplasm of upper-inner quadrant of right female breast: Secondary | ICD-10-CM | POA: Diagnosis not present

## 2016-10-21 DIAGNOSIS — Z170 Estrogen receptor positive status [ER+]: Secondary | ICD-10-CM

## 2016-10-21 DIAGNOSIS — R2689 Other abnormalities of gait and mobility: Secondary | ICD-10-CM | POA: Diagnosis not present

## 2016-10-21 DIAGNOSIS — Z8572 Personal history of non-Hodgkin lymphomas: Secondary | ICD-10-CM

## 2016-10-21 DIAGNOSIS — I482 Chronic atrial fibrillation, unspecified: Secondary | ICD-10-CM

## 2016-10-21 DIAGNOSIS — C8591 Non-Hodgkin lymphoma, unspecified, lymph nodes of head, face, and neck: Secondary | ICD-10-CM

## 2016-10-21 DIAGNOSIS — D801 Nonfamilial hypogammaglobulinemia: Secondary | ICD-10-CM | POA: Diagnosis not present

## 2016-10-21 DIAGNOSIS — Z7901 Long term (current) use of anticoagulants: Secondary | ICD-10-CM | POA: Diagnosis not present

## 2016-10-21 DIAGNOSIS — R29898 Other symptoms and signs involving the musculoskeletal system: Secondary | ICD-10-CM | POA: Diagnosis not present

## 2016-10-21 DIAGNOSIS — Z79811 Long term (current) use of aromatase inhibitors: Secondary | ICD-10-CM | POA: Diagnosis not present

## 2016-10-21 DIAGNOSIS — D638 Anemia in other chronic diseases classified elsewhere: Secondary | ICD-10-CM

## 2016-10-21 NOTE — Assessment & Plan Note (Signed)
She has chronic atrial fibrillation, s/p ablation and now has recurrent A. fib. Currently, she is on medical management & chronic anticoagulation therapy as directed by cardiologist She has no bleeding from anticoagulation therapy

## 2016-10-21 NOTE — Assessment & Plan Note (Signed)
She is taking Arimidex since 07/14/2015 as part of the adjuvant treatment for breast cancer. So far, she tolerated treatment well without any side effects. I will continue to see her on the regular basis including breast examination and will make sure that she get regular screening mammogram Her recent screening mammogram in February 2018 was normal Examination is benign.  She will continue Arimidex for  5 years.

## 2016-10-21 NOTE — Progress Notes (Signed)
[facility] OFFICE PROGRESS NOTE  Patient Care Team: Deland Pretty, MD as PCP - General (Internal Medicine) Adrian Prows, MD as Consulting Physician (Cardiology) Heath Lark, MD as Consulting Physician (Hematology and Oncology) Fanny Skates, MD as Consulting Physician (General Surgery) Eppie Gibson, MD as Attending Physician (Radiation Oncology) Sylvan Cheese, NP as Nurse Practitioner (Hematology and Oncology) Thompson Grayer, MD as Consulting Physician (Cardiology) Lahoma Rocker, MD as Consulting Physician (Rheumatology) Terrance Mass, MD as Consulting Physician (Gynecology)  SUMMARY OF ONCOLOGIC HISTORY: Oncology History   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      01/03/2014 Bone Marrow Biopsy    Accession: [id number] Bone marrow biopsy is negative      01/04/2014 Imaging    ECHO showed normal EF      01/04/2014 Procedure    She has placement of port      01/09/2014 - 03/07/2014 Chemotherapy    She was given treatment with bendamustine with rituximab. Treatment was stopped due to severe side-effects despite significant dose adjustment for cycle 2      01/18/2014 - 02/01/2014 Hospital Admission    She was admitted to the hospital from Escherichia coli sepsis with multiorgan failure and brief episodes  of intubation. She was discharged to skilled nursing facility      02/26/2014 Imaging    PET/CT scan showed near complete remission      02/27/2014 Adverse Reaction    Cycle 2 of treatment was resumed with drastic dose adjustment to bendamustine due to recent multi-organ failure      04/03/2014 - 03/13/2015 Chemotherapy    She is started on maintenance rituximab only.      04/09/2014 - 04/12/2014 Hospital Admission    The patient was admitted to the hospital with urinary tract infection and sepsis.      06/05/2014 Imaging    PET CT scan showed complete response to Rx      02/12/2015 Imaging    Ct scan showed no evidence of disease. It shows she has new pneumonia      04/02/2015 Miscellaneous    She received 1 dose IVIG complicated by mild infusion reaction      04/26/2015 - 04/30/2015 Hospital Admission    She had recurrent admission to the hospital with sepsis      10/22/2015 Imaging    CT scan of chest and abdomen showed Ill-defined tissue in the porta hepatis suggest treated lymphoma. No evidence of lymphadenopathy in the chest, abdomen & pelvis to suggest recurrent lymphoma.      10/20/2016 Imaging    1. Stable appearance of ill defined soft tissue within the porta hepatis compatible with treated lymphoma. 2. No new findings identified. 3. Aortic Atherosclerosis (  ICD10-I70.0). LAD coronary artery calcifications noted.       Breast cancer of upper-inner quadrant of right female breast ([place])   11/06/2014 Imaging    DEXA scan showed osteopenia T-1.9 on bilateral femoral neck      03/31/2015 Imaging    Screening mammogram showed suspicious lesion, confirmed on diagnostic imaging at 2 and 230 position on the right breast      04/10/2015 Pathology Results    Accession: [id number] breast biopsy in 2 locations came back invasive ductal carcinoma with calcification, 100% ER and PR positive, HER 2 neg      04/10/2015 Clinical Stage    Stage IA: T1 N0      05/27/2015 Surgery     Right total mastectomy: multifocal IDC, 1.5 and 1.0 cm, neg margins, ER+ (100% - both); PR+ (100% and 95%), HER2neu negative (ratio 1.69 and 1.14) Ki67 2% and 10%. DCIS      05/27/2015 Pathologic Stage    Stage IA: mpT1c pNx pMx      06/17/2015 Survivorship    Survivorship care plan mailed to patient at her request      07/14/2015 -  Anti-estrogen oral therapy    She started taking Arimidex      04/01/2016 Imaging    No mammographic evidence of malignancy in the left breast.       INTERVAL HISTORY: Please see below for problem oriented charting. She returns with her husband for further follow-up She denies new lymphadenopathy No recent infection She is currently placed on prophylactic antibiotic long-term for history of recurrent UTI She was recently found to have chronic atrial fibrillation again with plan for cardioversion in the near future She denies any recent abnormal breast examination, palpable mass, abnormal breast appearance or nipple changes The patient denies any recent signs or symptoms of bleeding such as spontaneous epistaxis, hematuria or hematochezia.  REVIEW OF SYSTEMS:   Constitutional: Denies fevers, chills or abnormal weight loss Eyes: Denies blurriness of vision Ears, nose, mouth, throat, and face: Denies mucositis or sore throat Respiratory: Denies cough, dyspnea or wheezes Cardiovascular: Denies palpitation, chest discomfort or lower extremity swelling Gastrointestinal:  Denies nausea, heartburn or change in bowel habits Skin: Denies abnormal skin rashes Lymphatics: Denies new lymphadenopathy or easy bruising Neurological:Denies numbness, tingling or new weaknesses Behavioral/Psych: Mood is stable, no new changes  All other systems were reviewed with the patient and are negative.  I have reviewed the past medical history, past surgical history, social history and family history with the patient and they are unchanged from previous note.  ALLERGIES:  is  allergic to morphine and related; multaq [dronedarone]; demerol; oysters [shellfish allergy]; penicillins; and sulfa drugs cross reactors.  MEDICATIONS:  Current Outpatient Prescriptions  Medication Sig Dispense Refill  . diclofenac sodium (VOLTAREN) 1 % GEL Apply 1 application topically 4 (four) times daily.    Marland Kitchen acetaminophen (TYLENOL) 500 MG tablet Take 1,000 mg by mouth every 6 (six) hours as needed for moderate pain, fever or headache.    . albuterol (PROVENTIL HFA;VENTOLIN HFA) 108 (90 Base) MCG/ACT inhaler Inhale into the lungs as directed.    Marland Kitchen albuterol (PROVENTIL) (2.5 MG/3ML) 0.083% nebulizer solution Inhale 3 mLs into the lungs every 6 (six) hours as needed for wheezing or shortness of breath.     . anastrozole (ARIMIDEX) 1 MG tablet TAKE ONE TABLET BY MOUTH EVERY DAY 90 tablet 9  . Cholecalciferol (VITAMIN D) 2000 UNITS tablet Take 2,000 Units by mouth daily with lunch.    Marland Kitchen  diclofenac sodium (VOLTAREN) 1 % GEL Apply 1 application topically 4 (four) times daily as needed.    . diltiazem (TIAZAC) 120 MG 24 hr capsule Take 120 mg by mouth daily.    Marland Kitchen doxycycline (VIBRAMYCIN) 100 MG capsule Take 100 mg by mouth daily.    . Fluticasone-Salmeterol (ADVAIR) 250-50 MCG/DOSE AEPB Inhale 2 puffs into the lungs every 12 (twelve) hours.     . hydrochlorothiazide (HYDRODIURIL) 25 MG tablet Take 37.5 mg by mouth daily as needed (as needed for leg swelling).    Marland Kitchen levothyroxine (SYNTHROID, LEVOTHROID) 200 MCG tablet Take 200 mcg by mouth daily before breakfast.    . lidocaine-prilocaine (EMLA) cream Apply 1 application topically as needed. Apply to [facility] a Cath site at least one hour prior to needle stick as needed. 30 g 10  . Magnesium Oxide 500 MG TABS Take 500 mg by mouth daily.     . metFORMIN (GLUCOPHAGE-XR) 500 MG 24 hr tablet Take 500 mg by mouth 2 (two) times daily with a meal.    . montelukast (SINGULAIR) 10 MG tablet Take 10 mg by mouth daily.    . Omega-3-acid Ethyl Esters (LOVAZA PO)  Take 1 capsule by mouth 2 (two) times daily.    . ondansetron (ZOFRAN) 8 MG tablet Take 1 tablet (8 mg total) by mouth every 8 (eight) hours as needed for nausea. (Patient not taking: Reported on 07/30/2016) 60 tablet 3  . pravastatin (PRAVACHOL) 20 MG tablet 1/2 tab mon-thurs    . rivaroxaban (XARELTO) 20 MG TABS tablet Take 20 mg by mouth daily with supper.    . traMADol-acetaminophen (ULTRACET) 37.5-325 MG tablet Take 1 tablet by mouth every 6 (six) hours as needed (Pain).    . triamterene-hydrochlorothiazide (MAXZIDE-25) 37.5-25 MG tablet Take 1 tablet by mouth daily.    . vitamin B-12 (CYANOCOBALAMIN) 500 MCG tablet Take 500 mcg by mouth daily.    . vitamin B-12 (CYANOCOBALAMIN) 500 MCG tablet Take 500 mcg by mouth daily.     No current facility-administered medications for this visit.     PHYSICAL EXAMINATION: ECOG PERFORMANCE STATUS: 1 - Symptomatic but completely ambulatory  Vitals:   10/21/16 0902  BP: 132/64  Pulse: 97  Resp: 17  Temp: 98 F (36.7 C)  SpO2: 100%   Filed Weights   10/21/16 0902  Weight: 144 lb 1.6 oz (65.4 kg)    GENERAL:alert, no distress and comfortable SKIN: skin color, texture, turgor are normal, no rashes or significant lesions EYES: normal, Conjunctiva are pink and non-injected, sclera clear OROPHARYNX:no exudate, no erythema and lips, buccal mucosa, and tongue normal  NECK: supple, thyroid normal size, non-tender, without nodularity LYMPH:  no palpable lymphadenopathy in the cervical, axillary or inguinal LUNGS: clear to auscultation and percussion with normal breathing effort HEART: regular rate & rhythm and no murmurs and no lower extremity edema ABDOMEN:abdomen soft, non-tender and normal bowel sounds Musculoskeletal:no cyanosis of digits and no clubbing  NEURO: alert & oriented x 3 with fluent speech, no focal motor/sensory deficits Chest examination revealed well-healed mastectomy scar on the right.  There is no palpable abnormalities on the  left breast.  LABORATORY DATA:  I have reviewed the data as listed    Component Value Date/Time   NA 138 10/20/2016 0922   K 3.9 10/20/2016 0922   CL 108 08/20/2015 0430   CO2 25 10/20/2016 0922   GLUCOSE 122 10/20/2016 0922   BUN 12.2 10/20/2016 0922   CREATININE 0.8 10/20/2016 0922   CALCIUM  10.2 10/20/2016 0922   PROT 7.2 10/20/2016 0922   ALBUMIN 4.0 10/20/2016 0922   AST 15 10/20/2016 0922   ALT 13 10/20/2016 0922   ALKPHOS 75 10/20/2016 0922   BILITOT 0.64 10/20/2016 0922   GFRNONAA >60 08/20/2015 0430   GFRAA >60 08/20/2015 0430    No results found for: SPEP, UPEP  Lab Results  Component Value Date   WBC 6.6 10/20/2016   NEUTROABS 4.6 10/20/2016   HGB 9.9 (L) 10/20/2016   HCT 30.8 (L) 10/20/2016   MCV 85.1 10/20/2016   PLT 219 10/20/2016      Chemistry      Component Value Date/Time   NA 138 10/20/2016 0922   K 3.9 10/20/2016 0922   CL 108 08/20/2015 0430   CO2 25 10/20/2016 0922   BUN 12.2 10/20/2016 0922   CREATININE 0.8 10/20/2016 0922      Component Value Date/Time   CALCIUM 10.2 10/20/2016 0922   ALKPHOS 75 10/20/2016 0922   AST 15 10/20/2016 0922   ALT 13 10/20/2016 0922   BILITOT 0.64 10/20/2016 0922       RADIOGRAPHIC STUDIES: I have reviewed imaging study with the patient and her husband I have personally reviewed the radiological images as listed and agreed with the findings in the report. Ct Chest W Contrast  Result Date: 10/20/2016 CLINICAL DATA:  Follicular lymphoma.  Breast cancer. EXAM: CT CHEST, ABDOMEN, AND PELVIS WITH CONTRAST TECHNIQUE: Multidetector CT imaging of the chest, abdomen and pelvis was performed following the standard protocol during bolus administration of intravenous contrast. CONTRAST:  144m ISOVUE-300 IOPAMIDOL (ISOVUE-300) INJECTION 61% COMPARISON:  10/22/2015 FINDINGS: CT CHEST FINDINGS Cardiovascular: The heart size is normal. Aortic atherosclerosis. Calcification in the LAD and left circumflex coronary artery  noted. Mediastinum/Nodes: The trachea appears patent and is midline. Normal appearance of the esophagus. No enlarged mediastinal or hilar lymph nodes. No axillary or supraclavicular adenopathy. Lungs/Pleura: No pleural effusion. No suspicious pulmonary nodules identified. 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 No aggressive lytic or sclerotic bone lesions. Previous right mastectomy. CT ABDOMEN PELVIS FINDINGS Hepatobiliary: Previous cholecystectomy.  No biliary dilatation. Pancreas: Unremarkable. No pancreatic ductal dilatation or surrounding inflammatory changes. Spleen: Normal in size without focal abnormality. Adrenals/Urinary Tract: The adrenal glands are normal. Unremarkable appearance of both kidneys. No mass or hydronephrosis. The urinary bladder appears normal. Stomach/Bowel: The stomach is normal. No pathologic dilatation of the large or small bowel loops. Vascular/Lymphatic: Aortic atherosclerosis. Within the porta hepatis there is an area of ill defined soft tissue measuring 4.0 x 6.6 x 5.3 cm. On the previous exam this measured 6.3 x 4.1 x 6.2 cm. No enlarged retroperitoneal lymph nodes. No enlarged pelvic or inguinal adenopathy. Reproductive: Status post hysterectomy. No adnexal masses. Other: No abdominal wall hernia or abnormality. No abdominopelvic ascites. Musculoskeletal: No acute or significant osseous findings. IMPRESSION: 1. Stable appearance of ill defined soft tissue within the porta hepatis compatible with treated lymphoma. 2. No new findings identified. 3. Aortic Atherosclerosis (ICD10-I70.0). LAD coronary artery calcifications noted. Electronically Signed   By: TKerby MoorsM.D.   On: 10/20/2016 14:24   Ct Abdomen Pelvis W Contrast  Result Date: 10/20/2016 CLINICAL DATA:  Follicular lymphoma.  Breast cancer. EXAM: CT CHEST, ABDOMEN, AND PELVIS WITH CONTRAST TECHNIQUE: Multidetector CT imaging of the chest, abdomen and pelvis was performed  following the standard protocol during bolus administration of intravenous contrast. CONTRAST:  1097mISOVUE-300 IOPAMIDOL (ISOVUE-300) INJECTION 61% COMPARISON:  10/22/2015 FINDINGS: CT CHEST  FINDINGS Cardiovascular: The heart size is normal. Aortic atherosclerosis. Calcification in the LAD and left circumflex coronary artery noted. Mediastinum/Nodes: The trachea appears patent and is midline. Normal appearance of the esophagus. No enlarged mediastinal or hilar lymph nodes. No axillary or supraclavicular adenopathy. Lungs/Pleura: No pleural effusion. No suspicious pulmonary nodules identified. 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 No aggressive lytic or sclerotic bone lesions. Previous right mastectomy. CT ABDOMEN PELVIS FINDINGS Hepatobiliary: Previous cholecystectomy.  No biliary dilatation. Pancreas: Unremarkable. No pancreatic ductal dilatation or surrounding inflammatory changes. Spleen: Normal in size without focal abnormality. Adrenals/Urinary Tract: The adrenal glands are normal. Unremarkable appearance of both kidneys. No mass or hydronephrosis. The urinary bladder appears normal. Stomach/Bowel: The stomach is normal. No pathologic dilatation of the large or small bowel loops. Vascular/Lymphatic: Aortic atherosclerosis. Within the porta hepatis there is an area of ill defined soft tissue measuring 4.0 x 6.6 x 5.3 cm. On the previous exam this measured 6.3 x 4.1 x 6.2 cm. No enlarged retroperitoneal lymph nodes. No enlarged pelvic or inguinal adenopathy. Reproductive: Status post hysterectomy. No adnexal masses. Other: No abdominal wall hernia or abnormality. No abdominopelvic ascites. Musculoskeletal: No acute or significant osseous findings. IMPRESSION: 1. Stable appearance of ill defined soft tissue within the porta hepatis compatible with treated lymphoma. 2. No new findings identified. 3. Aortic Atherosclerosis (ICD10-I70.0). LAD coronary artery calcifications  noted. Electronically Signed   By: Kerby Moors M.D.   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 So far, she tolerated treatment well without any side effects. I will continue to see her on the regular basis including breast examination and will make sure that she get regular screening mammogram Her recent screening mammogram in February 2018 was normal Examination is benign.  She will continue Arimidex for  5 years.  Anemia in chronic illness This is likely due to recent treatment and anemia of chronic disease. The patient denies recent history of bleeding such as epistaxis, hematuria or hematochezia. She is asymptomatic from the anemia. I will observe for now.   Chronic atrial fibrillation (HCC) She has chronic atrial fibrillation, s/p ablation and now has recurrent A. fib. 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 IgG, IgA, IgM    Standing Status:   Future    Standing Expiration Date:   11/25/2017   All  questions were answered. The patient knows to call the clinic with any problems, questions or concerns. No barriers to learning was detected. I spent 20 minutes counseling the patient face to face. The total time spent in the  appointment was 25 minutes and more than 50% was on counseling and review of test results     Heath Lark, MD 10/21/2016 11:03 AM

## 2016-10-21 NOTE — Telephone Encounter (Signed)
Gave patient AVS report and Calendar for October and November appointment

## 2016-10-21 NOTE — Assessment & Plan Note (Signed)
Patient has history of recurrent infection She has acquired hypogammaglobulinemia and had received IVIG She is currently on prophylactic, long-term treatment with antibiotics I do not recommend further IVIG and observe only I will check her immunoglobulin level in her next visit

## 2016-10-21 NOTE — Assessment & Plan Note (Signed)
I reviewed the CT scan which show no evidence of active disease She has completed chemotherapy treatment I recommend history and physical examination in 3 months and a plan to repeat imaging again in 1 year, due August 2019 In the meantime, I will maintain port patency with port flushes

## 2016-10-21 NOTE — Assessment & Plan Note (Signed)
This is likely due to recent treatment and anemia of chronic disease. The patient denies recent history of bleeding such as epistaxis, hematuria or hematochezia. She is asymptomatic from the anemia. I will observe for now.

## 2016-10-24 NOTE — H&P (Signed)
OFFICE VISIT NOTES COPIED TO EPIC FOR DOCUMENTATION  . History of Present Illness Patty Page MD; 10/13/2016 10:05 PM) Patient words: Last OV 09/17/2016; FU for htn, pt complains of sob and not feeling well.  The patient is a 76 year old female who presents for a Follow-up for Atrial fibrillation. Patient was the past one week noticed marked fatigue and dyspnea, also during physical therapy she was noted to be tachycardic, she was also seen by her PCP and due to persistent tachycardia, fatigue, suspected she had recurrence of atrial fibrillation and was referred back to me to be seen as a sick work in. She now presents here for follow-up. Continues to be symptomatic with marked fatigue and dyspnea. She denies any PND or orthopnea. She denies any chest pain or palpitations. No visual disturbances, nausea or vomiting. She is tolerating anticoagulation well.   Patient had recurrent episodes of atrial fibrillation and also atrial flutter, due to underlying bradycardia, Mobitz type II AV block, she could not tolerated flecainide or other agents, she underwent atrial fibrillation ablation by Dr. Thompson Grayer on 08/19/2015. She has h/o breast cancer underwent chemotherapy which she has completed and responded well and presently on immunotherapy.   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 Recommended cranberry supplement to prevent recurrent UTI. Discuss with cardiology regarding interaction with warfarin. 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 Stable. Pure hypercholesterolemia (E78.00)  Labwork  Labs 07/30/2016: HB 9.9/HCT 29.8, normal indicis, platelets 247. Potassium 3.8, serum glucose 153, BUN 16, creatinine 0.8. EGFR 74 mL. CMP otherwise normal. 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 Shelia Media Paroxysmal atrial fibrillation (I48.0) [05/27/2010]: CHA2DS2-VASc Score is 5 with yearly risk of stroke of 6.7 %. Bleeding risk is 1 %/year. Rec: [place] Atrial Fib And atrial flutter Ablation-Dr Thompson Grayer 08/19/2015 Direct current cardioversion 06/18/2015: 50J from atypical A. Flutter to SR with 1st degree AV Block. Reduced Flecainide from 1oo BID to 100am and 50 pm. Heart cath 2010 Mild coronary calcification, Normal EF. Stress cardiolite 3/12 [facility]) no ischemia . Admitted to [facility] in March 2012 for A. Fibrillation with RVR. Echo 04/08/11: Normal LVEF. Moderate LVH. Moderate LA enlargement at 4.5cm. 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 01/14/2016: Consider stopping dilt for bradycardia. Continue long term anticoagulation  Allergies Patty [name]; 10-18-2016 1:46 PM) Demerol *ANALGESICS - OPIOID*  Vomiting. Penicillin V *PENICILLINS*  Rash. SulfADIAZINE *SULFONAMIDES*  Rash. Multaq *ANTIARRHYTHMICS*  Rash. Penicillamine *ASSORTED CLASSES*   Family History Patty [name]; 10-18-2016 1:46 PM) Mother  Deceased. at age 33, from pneumonia; known CHF, h/o of MI  at age 88. Father  Deceased. at age 87, from a Stroke; h/o MI at age 53, HTN  Brother 1  Deceased. at age 66 from MI; no other heart attacks or strokes, no other cardiovascular conditions; 10 yrs older Sister 1  Deceased. at age 22 from cancer; stents and a pacemaker; no strokes or heart attacks, no other cardiovascular conditions; 6 yrs older  Social History Patty [name]; 10/11/2016 1:46 PM) Current tobacco use  Never smoker. Non Drinker/No Alcohol Use  Marital status  Married. Number of Children  2. Living Situation  Lives with spouse.  Past Surgical History Patty [name]; 10/11/2016 1:46 PM) Tonsillectomy 1950.  Appendectomy 1953  Hysterectomy in the 70's.  2 back surgeries in 1984 and 1986.  Cholecystectomy 1998.  Cataract surgery in both eyes 1992.  Mastectomy; Total - Right [05/27/2015]: Cardiac Ablation [04/2016]:  Medication History Patty [name]; 10/11/2016 1:57 PM) Xarelto (20MG Tablet, 1 (one) T Tablet Tablet Ta Oral every evening with dinner, Taken starting 12/10/2015) Active. Singulair (10MG Tablet, 1 Oral daily) Active. Advair Diskus (250-50MCG/DOSE Aero Pow Br Act, 1 puff Inhalation two times daily) Active. Lovaza (1GM Capsule, 2 Oral daily) Active. Vitamin D3 (2000UNIT Capsule, 1 Oral daily) Active. Tramadol-Acetaminophen (37.5-325MG Tablet, Oral as needed) Active. Magnesium Oxide (500MG Tablet, 1 Oral daily) Active. MetFORMIN HCl ER (500MG Tablet ER 24HR, 1 Oral two times daily, with food) Active. Pravastatin Sodium (40MG Tablet, 1/2 tablet Oral four days a week) Active. (Mon-Thurs) Diphenoxylate-Atropine (2.5-0.025MG Tablet, 1 Oral as needed for diarrhea) Active. Zofran ODT (8MG Tablet Disint, 1 Oral every 8 hours as needed for nausea) Active. ProAir HFA (108 (90 Base)MCG/ACT Aerosol Soln, 2 puffs Inhalation every 6 hours as needed) Active. Compazine (10MG Tablet, 1 Oral every 6 hours as needed for nausea)  Active. Synthroid (200MCG Tablet, 1 Oral daily) Active. (Dr Shelia Media changed due to elevated heart rate) Albuterol Sulfate ((2.5 MG/3ML)0.083% Nebulized Soln, 1 vial Inhalation as needed) Active. Triamterene-HCTZ (37.5-25MG Tablet, 1 Oral as needed for swelling) Active. Anastrozole (1MG Tablet, 1 Oral daily) Active. Doxycycline Hyclate (100MG Capsule, 1 Oral at bedtime) Active. Diclofenac Sodium (1% Gel, Transdermal as needed) Active. Vitamin B12 (1 Oral daily) Specific strength unknown - Active. HydroCHLOROthiazide (12.5MG Tablet, 1 Oral daily as needed for leg edema) Active. Medications Reconciled (Verbally; med list present)  Diagnostic Studies History Patty [name]; 10/11/2016 1:46 PM) Echo 04/08/11: Normal LVEF. Moderate LVH. Moderate LA enlargement at 4.5cm.  ECG 11/18/11: Sinus, 1st degree AV block, RBBB. Low voltage. Inferior ST-T change, cannot R/O ischemi  Cardioversion 04/13/11  Sleep study 2004 (Dx'd with sleep apnea-sleeps with CPAP every night).  CT Scan of Chest [02/12/2015]: Atherosclerotic calcification of the arterial vasculature without AAA. New peribronchovesicular ground glass and nodular consolidation in both lower lobes, most indicative of bronchopneumonia. Ablation [08/19/2015]: MRI [08/05/2016]: Lower back  Other Problems Patty [name]; 10/11/2016 1:46 PM) Heart cath 2010 (no stents-Dr. Peter Martinique) Mild coronary calcification, Normal EF. Admitted to [place] in March 2012 for A. Fibrillation with RVR. Stress cardiolite 3/12 [facility]) no ischemia .  Echo 2012 mild LVH. Normal EF.     Review of Systems Patty Page MD; 10/11/2016 2:38 PM) General Present- Fatigue (Worse last one week). Not Present- Fever and Night Sweats. Respiratory Present- Difficulty Breathing on Exertion (worse last one week). Not Present- Chronic Cough, Hemoptysis and Wakes up from Sleep Wheezing or Short of Breath. Cardiovascular Not Present- Chest Pain,  Claudications, Fainting, Orthopnea, Paroxysmal Nocturnal Dyspnea and Swelling of Extremities. Gastrointestinal Not Present- Abdominal Pain, Black, Tarry Stool, Constipation, Diarrhea, Nausea and Vomiting. Musculoskeletal Present- Joint Pain (knee and ankle) and Joint  Stiffness. Neurological Not Present- Dizziness, Focal Neurological Symptoms, Headaches and Syncope. Hematology Not Present- Blood Clots, Easy Bruising and Nose Bleed. All other systems negative  Vitals Patty [name]; 10/11/2016 2:05 PM) 10/11/2016 1:52 PM Weight: 145.13 lb Height: 66.5in Body Surface Area: 1.75 m Body Mass Index: 23.07 kg/m  Pulse: 100 (Regular)  P.OX: 99% (Room air) BP: 130/70 (Sitting, Left Arm, Standard)       Physical Exam Patty Page MD; 10/11/2016 2:39 PM) General Mental Status-Alert. General Appearance-Cooperative, Appears stated age, Not in acute distress. Orientation-Oriented X3. Build & Nutrition-Well nourished and Moderately built.  Head and Neck Thyroid Gland Characteristics - no palpable nodules, no palpable enlargement.  Chest and Lung Exam Chest and lung exam reveals -normal excursion with symmetric chest walls and quiet, even and easy respiratory effort with no use of accessory muscles. Palpation Tender - No chest wall tenderness.  Cardiovascular Cardiovascular examination reveals -carotid auscultation reveals no bruits, femoral artery auscultation bilaterally reveals normal pulses, no bruits, no thrills, normal pedal pulses bilaterally and no digital clubbing, cyanosis, edema, increased warmth or tenderness. Inspection Jugular vein - Right - No Distention. Auscultation Rhythm - Irregular. Heart Sounds - S1 is variable, S2 WNL and No gallop present. Murmurs & Other Heart Sounds: Murmur - Location - Sternal Border - Left. Timing - Early systolic. Grade - I/VI.  Abdomen Palpation/Percussion Normal exam - Non Tender and No  hepatosplenomegaly. Auscultation Normal exam - Bowel sounds normal.  Neurologic Motor-Grossly intact without any focal deficits.  Musculoskeletal Global Assessment Left Lower Extremity - normal range of motion without pain. Right Lower Extremity - normal range of motion without pain.    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 01/14/2016: Consider stopping dilt for bradycardia. Continue long term anticoagulation Paroxysmal atrial fibrillation (I48.0) Story: CHA2DS2-VASc Score is 5 with yearly risk of stroke of 6.7 %. Bleeding risk is 1 %/year. Rec: [place]  Atrial Fib And atrial flutter Ablation-Dr Thompson Grayer 08/19/2015  Direct current cardioversion 06/18/2015: 50J from atypical A. Flutter to SR with 1st degree AV Block. Reduced Flecainide from 1oo BID to 100am and 50 pm.  Heart cath 2010 Mild coronary calcification, Normal EF.  Stress cardiolite 3/12 [facility]) no ischemia . Admitted to [place] in March 2012 for A. Fibrillation with RVR.  Echo 04/08/11: Normal LVEF. Moderate LVH. Moderate LA enlargement at 4.5cm. Impression: EKG 10/11/2016: Atypical atrial flutter with variable ventricular response at the rate of 72 bpm, leftward axis, poor R-wave progression, cannot exclude anteroseptal infarct old, nonspecific ST-T abnormality.  EKG 09/17/2016: Sinus rhythm with first-degree AV block at the rate of 82 bpm, left axis deviation, left can't fascicular block. Poor R-wave progression, cannot exclude anterior infarct old. LVH with repolarization abnormality, cannot exclude lateral ischemia. No significant change from EKG 03/12/2013.  EKG 06/19/2015: Atrial flutter with 2:1 conduction at rate of 89 bpm, leftward axis, right bundle branch block. Poor R-wave progression, pulmonary disease pattern. Abnormal EKG. Current  Plans Complete electrocardiogram (93000) Restarted DilTIAZem HCl ER 120MG, 1 (one) Capsul Capsule ER 24H Capsule ER 24HR daily, #60, 10/11/2016, Ref. x2. Future Plans 9/37/3428: METABOLIC PANEL, BASIC (76811) - one time Labwork Story: 10/21/2016: Creatinine 0.7, glucose 105, EGFR 74, potassium 4.4, BMP otherwise normal. Labs 07/30/2016: HB 9.9/HCT 29.8, normal indicis, platelets 247. Potassium 3.8, serum glucose 153, BUN 16, creatinine 0.8. EGFR 74 mL. CMP otherwise normal.  06/10/2015: Glucose 130, creatinine 0.98, eGFR 56, potassium 4.7  04/30/2015: TSH 4.198  04/29/2015: HB 10.2/HCT 30 with normocytic indices, serum glucose 132, creatinine 0.72, potassium 3.6, sodium 133, magnesium 1.5 Essential hypertension, benign (I10)  Note:- Recommendations:  Patient seen on a sick visit for fatigue and shortness of breath, his back in atypical atrial flutter with rapid ventricular response, I have restarted diltiazem. She is presently on anticoagulation and hence we can proceed with direct current cardioversion. Blood pressure is stable, she should be able to tolerate diltiazem well which I have restarted. I have explained to her regarding cardioversion and she is willing to proceed.  CC: Dr. Deland Pretty.  Signed by Patty Page, MD (10/13/2016 10:07 PM)

## 2016-10-26 ENCOUNTER — Encounter (HOSPITAL_COMMUNITY)
Admission: RE | Disposition: A | Discharge status: Home / Self Care | Payer: Self-pay | Source: Ambulatory Visit | Attending: Cardiology

## 2016-10-26 ENCOUNTER — Ambulatory Visit (HOSPITAL_COMMUNITY): Payer: Medicare Other | Admitting: Anesthesiology

## 2016-10-26 ENCOUNTER — Encounter (HOSPITAL_COMMUNITY): Payer: Self-pay | Admitting: Emergency Medicine

## 2016-10-26 ENCOUNTER — Ambulatory Visit (HOSPITAL_COMMUNITY)
Admission: RE | Admit: 2016-10-26 | Discharge: 2016-10-26 | Disposition: A | Discharge status: Home / Self Care | Payer: Medicare Other | Source: Ambulatory Visit | Attending: Cardiology | Admitting: Cardiology

## 2016-10-26 DIAGNOSIS — Z9011 Acquired absence of right breast and nipple: Secondary | ICD-10-CM | POA: Insufficient documentation

## 2016-10-26 DIAGNOSIS — Z9889 Other specified postprocedural states: Secondary | ICD-10-CM | POA: Diagnosis not present

## 2016-10-26 DIAGNOSIS — I480 Paroxysmal atrial fibrillation: Secondary | ICD-10-CM | POA: Diagnosis present

## 2016-10-26 DIAGNOSIS — Z90710 Acquired absence of both cervix and uterus: Secondary | ICD-10-CM | POA: Diagnosis not present

## 2016-10-26 DIAGNOSIS — I452 Bifascicular block: Secondary | ICD-10-CM | POA: Insufficient documentation

## 2016-10-26 DIAGNOSIS — Z853 Personal history of malignant neoplasm of breast: Secondary | ICD-10-CM | POA: Diagnosis not present

## 2016-10-26 DIAGNOSIS — Z7951 Long term (current) use of inhaled steroids: Secondary | ICD-10-CM | POA: Insufficient documentation

## 2016-10-26 DIAGNOSIS — I484 Atypical atrial flutter: Secondary | ICD-10-CM | POA: Insufficient documentation

## 2016-10-26 DIAGNOSIS — Z8249 Family history of ischemic heart disease and other diseases of the circulatory system: Secondary | ICD-10-CM | POA: Diagnosis not present

## 2016-10-26 DIAGNOSIS — E119 Type 2 diabetes mellitus without complications: Secondary | ICD-10-CM | POA: Diagnosis not present

## 2016-10-26 DIAGNOSIS — Z9049 Acquired absence of other specified parts of digestive tract: Secondary | ICD-10-CM | POA: Insufficient documentation

## 2016-10-26 DIAGNOSIS — Z7901 Long term (current) use of anticoagulants: Secondary | ICD-10-CM | POA: Diagnosis not present

## 2016-10-26 DIAGNOSIS — Z8572 Personal history of non-Hodgkin lymphomas: Secondary | ICD-10-CM | POA: Diagnosis not present

## 2016-10-26 DIAGNOSIS — Z79899 Other long term (current) drug therapy: Secondary | ICD-10-CM | POA: Diagnosis not present

## 2016-10-26 DIAGNOSIS — Z880 Allergy status to penicillin: Secondary | ICD-10-CM | POA: Diagnosis not present

## 2016-10-26 DIAGNOSIS — Z7984 Long term (current) use of oral hypoglycemic drugs: Secondary | ICD-10-CM | POA: Diagnosis not present

## 2016-10-26 DIAGNOSIS — Z791 Long term (current) use of non-steroidal anti-inflammatories (NSAID): Secondary | ICD-10-CM | POA: Insufficient documentation

## 2016-10-26 DIAGNOSIS — Z885 Allergy status to narcotic agent status: Secondary | ICD-10-CM | POA: Diagnosis not present

## 2016-10-26 DIAGNOSIS — Z888 Allergy status to other drugs, medicaments and biological substances status: Secondary | ICD-10-CM | POA: Insufficient documentation

## 2016-10-26 DIAGNOSIS — E7800 Pure hypercholesterolemia, unspecified: Secondary | ICD-10-CM | POA: Diagnosis not present

## 2016-10-26 DIAGNOSIS — Z7902 Long term (current) use of antithrombotics/antiplatelets: Secondary | ICD-10-CM | POA: Diagnosis not present

## 2016-10-26 DIAGNOSIS — I4510 Unspecified right bundle-branch block: Secondary | ICD-10-CM | POA: Diagnosis not present

## 2016-10-26 DIAGNOSIS — E039 Hypothyroidism, unspecified: Secondary | ICD-10-CM | POA: Diagnosis not present

## 2016-10-26 DIAGNOSIS — I10 Essential (primary) hypertension: Secondary | ICD-10-CM | POA: Diagnosis not present

## 2016-10-26 DIAGNOSIS — Z882 Allergy status to sulfonamides status: Secondary | ICD-10-CM | POA: Diagnosis not present

## 2016-10-26 HISTORY — PX: CARDIOVERSION: SHX1299

## 2016-10-26 SURGERY — CARDIOVERSION
Anesthesia: General

## 2016-10-26 MED ORDER — SODIUM CHLORIDE 0.9 % IV SOLN
INTRAVENOUS | Status: DC
Start: 2016-10-26 — End: 2016-10-26
  Administered 2016-10-26: 13:00:00 via INTRAVENOUS

## 2016-10-26 MED ORDER — LIDOCAINE 2% (20 MG/ML) 5 ML SYRINGE
INTRAMUSCULAR | Status: DC | PRN
  Administered 2016-10-26: 100 mg via INTRAVENOUS

## 2016-10-26 MED ORDER — PROPOFOL 10 MG/ML IV BOLUS
INTRAVENOUS | Status: DC | PRN
  Administered 2016-10-26: 50 mg via INTRAVENOUS

## 2016-10-26 NOTE — Interval H&P Note (Signed)
History and Physical Interval Note:  10/26/2016 1:28 PM  Patty [name]  has presented today for surgery, with the diagnosis of A-FIB  The various methods of treatment have been discussed with the patient and family. After consideration of risks, benefits and other options for treatment, the patient has consented to  Procedure(s): CARDIOVERSION (N/A) as a surgical intervention .  The patient's history has been reviewed, patient examined, no change in status, stable for surgery.  I have reviewed the patient's chart and labs.  Questions were answered to the patient's satisfaction.     Adrian Prows

## 2016-10-26 NOTE — Discharge Instructions (Signed)
Electrical Cardioversion, Care After °This sheet gives you information about how to care for yourself after your procedure. Your health care provider may also give you more specific instructions. If you have problems or questions, contact your health care provider. °What can I expect after the procedure? °After the procedure, it is common to have: °· Some redness on the skin where the shocks were given. ° °Follow these instructions at home: °· Do not drive for 24 hours if you were given a medicine to help you relax (sedative). °· Take over-the-counter and prescription medicines only as told by your health care provider. °· Ask your health care provider how to check your pulse. Check it often. °· Rest for 48 hours after the procedure or as told by your health care provider. °· Avoid or limit your caffeine use as told by your health care provider. °Contact a health care provider if: °· You feel like your heart is beating too quickly or your pulse is not regular. °· You have a serious muscle cramp that does not go away. °Get help right away if: °· You have discomfort in your chest. °· You are dizzy or you feel faint. °· You have trouble breathing or you are short of breath. °· Your speech is slurred. °· You have trouble moving an arm or leg on one side of your body. °· Your fingers or toes turn cold or blue. °This information is not intended to replace advice given to you by your health care provider. Make sure you discuss any questions you have with your health care provider. °Document Released: 12/06/2012 Document Revised: 09/19/2015 Document Reviewed: 08/22/2015 °Elsevier Interactive Patient Education © 2018 [employer] ° °

## 2016-10-26 NOTE — Anesthesia Preprocedure Evaluation (Addendum)
Anesthesia Evaluation  Patient identified by MRN, date of birth, ID band Patient awake    Reviewed: Allergy & Precautions, NPO status , Patient's Chart, lab work & pertinent test results  History of Anesthesia Complications Negative for: history of anesthetic complications  Airway Mallampati: II  TM Distance: >3 FB Neck ROM: Full    Dental no notable dental hx. (+) Dental Advisory Given, Partial Lower   Pulmonary shortness of breath, sleep apnea ,    Pulmonary exam normal        Cardiovascular hypertension,  Rhythm:Irregular     Neuro/Psych negative neurological ROS  negative psych ROS   GI/Hepatic negative GI ROS, Neg liver ROS,   Endo/Other  diabetes  Renal/GU Renal InsufficiencyRenal disease     Musculoskeletal   Abdominal   Peds  Hematology negative hematology ROS (+)   Anesthesia Other Findings   Reproductive/Obstetrics                            Anesthesia Physical Anesthesia Plan  ASA: III  Anesthesia Plan: General   Post-op Pain Management:    Induction: Intravenous  PONV Risk Score and Plan: Treatment may vary due to age or medical condition  Airway Management Planned: Mask  Additional Equipment:   Intra-op Plan:   Post-operative Plan:   Informed Consent: I have reviewed the patients History and Physical, chart, labs and discussed the procedure including the risks, benefits and alternatives for the proposed anesthesia with the patient or authorized representative who has indicated his/her understanding and acceptance.   Dental advisory given  Plan Discussed with: Anesthesiologist, Surgeon and CRNA  Anesthesia Plan Comments:         Anesthesia Quick Evaluation

## 2016-10-26 NOTE — CV Procedure (Signed)
Direct current cardioversion:  Indication symptomatic Atypical atrial flutter  Procedure: Using 50 mg of IV Propofol and 100 IV Lidocaine (for reducing venous pain) for achieving deep sedation, synchronized direct current cardioversion performed. Patient was delivered with 75 Joules of electricity X 1 with success to NSR. Patient tolerated the procedure well. No immediate complication noted.

## 2016-10-26 NOTE — Transfer of Care (Signed)
Immediate Anesthesia Transfer of Care Note  Patient: Patty [name]  Procedure(s) Performed: Procedure(s): CARDIOVERSION (N/A)  Patient Location: Endoscopy Unit  Anesthesia Type:General  Level of Consciousness: awake, alert  and patient cooperative  Airway & Oxygen Therapy: Patient Spontanous Breathing  Post-op Assessment: Report given to RN and Post -op Vital signs reviewed and stable  Post vital signs: Reviewed and stable  Last Vitals:  Vitals:   10/26/16 1345 10/26/16 1346  BP: 108/64   Pulse: 65 64  Resp: 12 12  Temp:    SpO2: 100% 100%    Last Pain:  Vitals:   10/26/16 1313  TempSrc: Oral         Complications: No apparent anesthesia complications

## 2016-10-26 NOTE — Anesthesia Postprocedure Evaluation (Signed)
Anesthesia Post Note  Patient: Patty [name]  Procedure(s) Performed: Procedure(s) (LRB): CARDIOVERSION (N/A)     Patient location during evaluation: PACU Anesthesia Type: General Level of consciousness: sedated Pain management: pain level controlled Vital Signs Assessment: post-procedure vital signs reviewed and stable Respiratory status: spontaneous breathing and respiratory function stable Cardiovascular status: stable Anesthetic complications: no    Last Vitals:  Vitals:   10/26/16 1355 10/26/16 1405  BP: (!) 109/46 (!) 112/56  Pulse: 71 75  Resp: (!) 21 20  Temp:    SpO2: 98% 98%    Last Pain:  Vitals:   10/26/16 1349  TempSrc: Oral                 Aaliah Jorgenson DANIEL

## 2016-10-27 ENCOUNTER — Encounter (HOSPITAL_COMMUNITY): Payer: Self-pay | Admitting: Cardiology

## 2016-11-05 DIAGNOSIS — I10 Essential (primary) hypertension: Secondary | ICD-10-CM | POA: Diagnosis not present

## 2016-11-05 DIAGNOSIS — I480 Paroxysmal atrial fibrillation: Secondary | ICD-10-CM | POA: Diagnosis not present

## 2016-11-08 DIAGNOSIS — I4891 Unspecified atrial fibrillation: Secondary | ICD-10-CM | POA: Diagnosis not present

## 2016-11-25 DIAGNOSIS — Z23 Encounter for immunization: Secondary | ICD-10-CM | POA: Diagnosis not present

## 2016-11-25 DIAGNOSIS — N312 Flaccid neuropathic bladder, not elsewhere classified: Secondary | ICD-10-CM | POA: Diagnosis not present

## 2016-11-25 DIAGNOSIS — M810 Age-related osteoporosis without current pathological fracture: Secondary | ICD-10-CM | POA: Diagnosis not present

## 2016-11-26 ENCOUNTER — Telehealth: Payer: Self-pay

## 2016-11-26 NOTE — Telephone Encounter (Signed)
Patient called and said she saw her cardiologist recently, Dr. Adrian Prows. He wants her to get iron infusions. She has been so short of breath recently and she thought she would feel better.

## 2016-11-29 ENCOUNTER — Other Ambulatory Visit: Payer: Self-pay | Admitting: Hematology and Oncology

## 2016-11-29 ENCOUNTER — Telehealth: Payer: Self-pay | Admitting: Hematology and Oncology

## 2016-11-29 DIAGNOSIS — D638 Anemia in other chronic diseases classified elsewhere: Secondary | ICD-10-CM

## 2016-11-29 NOTE — Telephone Encounter (Signed)
Spoke with patient regarding her appt that was added per 10/1 sch msg

## 2016-11-29 NOTE — Telephone Encounter (Signed)
Called with below message. Verbalized understanding. 

## 2016-11-29 NOTE — Telephone Encounter (Signed)
Iron would only help if she has low iron I have added lab appt on 10/4: will call her after test results are available

## 2016-12-02 ENCOUNTER — Telehealth: Payer: Self-pay

## 2016-12-02 ENCOUNTER — Other Ambulatory Visit (HOSPITAL_BASED_OUTPATIENT_CLINIC_OR_DEPARTMENT_OTHER): Payer: Medicare Other

## 2016-12-02 ENCOUNTER — Ambulatory Visit: Payer: Medicare Other

## 2016-12-02 ENCOUNTER — Other Ambulatory Visit: Payer: Self-pay | Admitting: Hematology and Oncology

## 2016-12-02 DIAGNOSIS — C8591 Non-Hodgkin lymphoma, unspecified, lymph nodes of head, face, and neck: Secondary | ICD-10-CM | POA: Diagnosis present

## 2016-12-02 DIAGNOSIS — D801 Nonfamilial hypogammaglobulinemia: Secondary | ICD-10-CM

## 2016-12-02 DIAGNOSIS — D638 Anemia in other chronic diseases classified elsewhere: Secondary | ICD-10-CM | POA: Diagnosis not present

## 2016-12-02 LAB — CBC WITH DIFFERENTIAL/PLATELET
BASO%: 0.3 % (ref 0.0–2.0)
Basophils Absolute: 0 10*3/uL (ref 0.0–0.1)
EOS%: 1.5 % (ref 0.0–7.0)
Eosinophils Absolute: 0.1 10*3/uL (ref 0.0–0.5)
HEMATOCRIT: 30.9 % — AB (ref 34.8–46.6)
HEMOGLOBIN: 10.1 g/dL — AB (ref 11.6–15.9)
LYMPH#: 1.3 10*3/uL (ref 0.9–3.3)
LYMPH%: 17.8 % (ref 14.0–49.7)
MCH: 28 pg (ref 25.1–34.0)
MCHC: 32.7 g/dL (ref 31.5–36.0)
MCV: 85.6 fL (ref 79.5–101.0)
MONO#: 0.7 10*3/uL (ref 0.1–0.9)
MONO%: 9.2 % (ref 0.0–14.0)
NEUT#: 5.2 10*3/uL (ref 1.5–6.5)
NEUT%: 71.2 % (ref 38.4–76.8)
Platelets: 188 10*3/uL (ref 145–400)
RBC: 3.61 10*6/uL — ABNORMAL LOW (ref 3.70–5.45)
RDW: 14.9 % — AB (ref 11.2–14.5)
WBC: 7.3 10*3/uL (ref 3.9–10.3)

## 2016-12-02 LAB — FERRITIN: FERRITIN: 26 ng/mL (ref 9–269)

## 2016-12-02 LAB — COMPREHENSIVE METABOLIC PANEL
ALBUMIN: 4.1 g/dL (ref 3.5–5.0)
ALK PHOS: 70 U/L (ref 40–150)
ALT: 13 U/L (ref 0–55)
ANION GAP: 10 meq/L (ref 3–11)
AST: 14 U/L (ref 5–34)
BILIRUBIN TOTAL: 0.37 mg/dL (ref 0.20–1.20)
BUN: 16.8 mg/dL (ref 7.0–26.0)
CALCIUM: 10 mg/dL (ref 8.4–10.4)
CO2: 24 mEq/L (ref 22–29)
Chloride: 107 mEq/L (ref 98–109)
Creatinine: 0.8 mg/dL (ref 0.6–1.1)
EGFR: 75 mL/min/{1.73_m2} — AB (ref >90)
Glucose: 126 mg/dl (ref 70–140)
Potassium: 3.7 mEq/L (ref 3.5–5.1)
Sodium: 140 mEq/L (ref 136–145)
TOTAL PROTEIN: 6.7 g/dL (ref 6.4–8.3)

## 2016-12-02 LAB — IRON AND TIBC
%SAT: 13 % — ABNORMAL LOW (ref 21–57)
IRON: 52 ug/dL (ref 41–142)
TIBC: 393 ug/dL (ref 236–444)
UIBC: 341 ug/dL (ref 120–384)

## 2016-12-02 MED ORDER — HEPARIN SOD (PORK) LOCK FLUSH 100 UNIT/ML IV SOLN
500.0000 [IU] | Freq: Once | INTRAVENOUS | Status: AC | PRN
  Administered 2016-12-02: 500 [IU] via INTRAVENOUS
  Filled 2016-12-02: qty 5

## 2016-12-02 MED ORDER — SODIUM CHLORIDE 0.9 % IJ SOLN
10.0000 mL | INTRAMUSCULAR | Status: DC | PRN
  Administered 2016-12-02: 10 mL via INTRAVENOUS
  Filled 2016-12-02: qty 10

## 2016-12-02 NOTE — Telephone Encounter (Signed)
Called with below message. 

## 2016-12-02 NOTE — Telephone Encounter (Signed)
-----   Message from Heath Lark, MD sent at 12/02/2016  3:48 PM EDT ----- Regarding: Iv iron pls tell her blood test showed mild iron def I will see her back and start her on IV iron on 10/17 I have placed scheduling msg ----- Message ----- From: Interface, Lab In Three Zero One Sent: 12/02/2016   1:50 PM To: Heath Lark, MD

## 2016-12-03 ENCOUNTER — Telehealth: Payer: Self-pay | Admitting: Hematology and Oncology

## 2016-12-03 NOTE — Telephone Encounter (Signed)
Spoke with patient regarding appts scheduled per 10/4 sch msg.

## 2016-12-08 DIAGNOSIS — R300 Dysuria: Secondary | ICD-10-CM | POA: Diagnosis not present

## 2016-12-15 ENCOUNTER — Ambulatory Visit (HOSPITAL_BASED_OUTPATIENT_CLINIC_OR_DEPARTMENT_OTHER): Payer: Medicare Other | Admitting: Hematology and Oncology

## 2016-12-15 ENCOUNTER — Other Ambulatory Visit: Payer: Self-pay | Admitting: Hematology and Oncology

## 2016-12-15 ENCOUNTER — Encounter: Payer: Self-pay | Admitting: Hematology and Oncology

## 2016-12-15 ENCOUNTER — Ambulatory Visit (HOSPITAL_BASED_OUTPATIENT_CLINIC_OR_DEPARTMENT_OTHER): Payer: Medicare Other

## 2016-12-15 VITALS — BP 119/46 | HR 72 | Temp 98.1°F | Resp 18

## 2016-12-15 DIAGNOSIS — C50211 Malignant neoplasm of upper-inner quadrant of right female breast: Secondary | ICD-10-CM

## 2016-12-15 DIAGNOSIS — D509 Iron deficiency anemia, unspecified: Secondary | ICD-10-CM | POA: Insufficient documentation

## 2016-12-15 DIAGNOSIS — C8591 Non-Hodgkin lymphoma, unspecified, lymph nodes of head, face, and neck: Secondary | ICD-10-CM

## 2016-12-15 DIAGNOSIS — Z170 Estrogen receptor positive status [ER+]: Secondary | ICD-10-CM

## 2016-12-15 DIAGNOSIS — D500 Iron deficiency anemia secondary to blood loss (chronic): Secondary | ICD-10-CM

## 2016-12-15 DIAGNOSIS — Z452 Encounter for adjustment and management of vascular access device: Secondary | ICD-10-CM

## 2016-12-15 DIAGNOSIS — D801 Nonfamilial hypogammaglobulinemia: Secondary | ICD-10-CM

## 2016-12-15 MED ORDER — DIPHENHYDRAMINE HCL 25 MG PO CAPS: 25.0000 mg | ORAL_CAPSULE | Freq: Once | ORAL | Status: DC

## 2016-12-15 MED ORDER — DEXAMETHASONE SODIUM PHOSPHATE 10 MG/ML IJ SOLN: 5.0000 mg | Freq: Once | INTRAMUSCULAR | Status: DC

## 2016-12-15 MED ORDER — HEPARIN SOD (PORK) LOCK FLUSH 100 UNIT/ML IV SOLN
500.0000 [IU] | Freq: Once | INTRAVENOUS | Status: AC | PRN
Start: 2016-12-15 — End: 2016-12-15
  Administered 2016-12-15: 500 [IU]
  Filled 2016-12-15: qty 5

## 2016-12-15 MED ORDER — ALTEPLASE 2 MG IJ SOLR
2.0000 mg | Freq: Once | INTRAMUSCULAR | Status: DC | PRN
  Filled 2016-12-15: qty 2

## 2016-12-15 MED ORDER — IMMUNE GLOBULIN (HUMAN) 10 GM/100ML IV SOLN: 400.0000 mg/kg | Freq: Once | INTRAVENOUS | Status: DC

## 2016-12-15 MED ORDER — SODIUM CHLORIDE 0.9% FLUSH
10.0000 mL | INTRAVENOUS | Status: DC | PRN
  Administered 2016-12-15: 10 mL
  Filled 2016-12-15: qty 10

## 2016-12-15 MED ORDER — ALTEPLASE 2 MG IJ SOLR
2.0000 mg | Freq: Once | INTRAMUSCULAR | Status: AC | PRN
  Administered 2016-12-15: 2 mg
  Filled 2016-12-15: qty 2

## 2016-12-15 MED ORDER — FERUMOXYTOL INJECTION 510 MG/17 ML
510.0000 mg | Freq: Once | INTRAVENOUS | Status: AC
  Administered 2016-12-15: 510 mg via INTRAVENOUS
  Filled 2016-12-15: qty 17

## 2016-12-15 MED ORDER — SODIUM CHLORIDE 0.9 % IV SOLN
Freq: Once | INTRAVENOUS | Status: AC
  Administered 2016-12-15: 13:00:00 via INTRAVENOUS

## 2016-12-15 NOTE — Progress Notes (Signed)
[place] OFFICE PROGRESS NOTE  Patient Care Team: Deland Pretty, MD as PCP - General (Internal Medicine) Adrian Prows, MD as Consulting Physician (Cardiology) Heath Lark, MD as Consulting Physician (Hematology and Oncology) Fanny Skates, MD as Consulting Physician (General Surgery) Eppie Gibson, MD as Attending Physician (Radiation Oncology) Sylvan Cheese, NP as Nurse Practitioner (Hematology and Oncology) Thompson Grayer, MD as Consulting Physician (Cardiology) Lahoma Rocker, MD as Consulting Physician (Rheumatology) Terrance Mass, MD as Consulting Physician (Gynecology)  SUMMARY OF ONCOLOGIC HISTORY: Oncology History   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      01/03/2014 Bone Marrow Biopsy    Accession: [id number] Bone marrow biopsy is negative      01/04/2014 Imaging    ECHO showed normal EF      01/04/2014 Procedure    She has placement of port      01/09/2014 - 03/07/2014 Chemotherapy    She was given treatment with bendamustine with rituximab. Treatment was stopped due to severe side-effects despite significant dose adjustment for cycle 2      01/18/2014 - 02/01/2014 Hospital Admission    She was admitted to the hospital from Escherichia coli sepsis with multiorgan failure and brief episodes  of intubation. She was discharged to skilled nursing facility      02/26/2014 Imaging    PET/CT scan showed near complete remission      02/27/2014 Adverse Reaction    Cycle 2 of treatment was resumed with drastic dose adjustment to bendamustine due to recent multi-organ failure      04/03/2014 - 03/13/2015 Chemotherapy    She is started on maintenance rituximab only.      04/09/2014 - 04/12/2014 Hospital Admission    The patient was admitted to the hospital with urinary tract infection and sepsis.      06/05/2014 Imaging    PET CT scan showed complete response to Rx      02/12/2015 Imaging    Ct scan showed no evidence of disease. It shows she has new pneumonia      04/02/2015 Miscellaneous    She received 1 dose IVIG complicated by mild infusion reaction      04/26/2015 - 04/30/2015 Hospital Admission    She had recurrent admission to the hospital with sepsis      10/22/2015 Imaging    CT scan of chest and abdomen showed Ill-defined tissue in the porta hepatis suggest treated lymphoma. No evidence of lymphadenopathy in the chest, abdomen & pelvis to suggest recurrent lymphoma.      10/20/2016 Imaging    1. Stable appearance of ill defined soft tissue within the porta hepatis compatible with treated lymphoma. 2. No new findings identified. 3. Aortic Atherosclerosis (  ICD10-I70.0). LAD coronary artery calcifications noted.       Breast cancer of upper-inner quadrant of right female breast ([place])   11/06/2014 Imaging    DEXA scan showed osteopenia T-1.9 on bilateral femoral neck      03/31/2015 Imaging    Screening mammogram showed suspicious lesion, confirmed on diagnostic imaging at 2 and 230 position on the right breast      04/10/2015 Pathology Results    Accession: [id number] breast biopsy in 2 locations came back invasive ductal carcinoma with calcification, 100% ER and PR positive, HER 2 neg      04/10/2015 Clinical Stage    Stage IA: T1 N0      05/27/2015 Surgery     Right total mastectomy: multifocal IDC, 1.5 and 1.0 cm, neg margins, ER+ (100% - both); PR+ (100% and 95%), HER2neu negative (ratio 1.69 and 1.14) Ki67 2% and 10%. DCIS      05/27/2015 Pathologic Stage    Stage IA: mpT1c pNx pMx      06/17/2015 Survivorship    Survivorship care plan mailed to patient at her request      07/14/2015 -  Anti-estrogen oral therapy    She started taking Arimidex      04/01/2016 Imaging    No mammographic evidence of malignancy in the left breast.       INTERVAL HISTORY: Please see below for problem oriented charting. He returns for further follow-up She denies new infection No new lymphadenopathy The patient denies any recent signs or symptoms of bleeding such as spontaneous epistaxis, hematuria or hematochezia. She denies any recent abnormal breast examination, palpable mass, abnormal breast appearance or nipple changes She was placed on oral iron supplement but did not tolerate that well.  She complain of fatigue  REVIEW OF SYSTEMS:   Constitutional: Denies fevers, chills or abnormal weight loss Eyes: Denies blurriness of vision Ears, nose, mouth, throat, and face: Denies mucositis or sore throat Respiratory: Denies cough, dyspnea or wheezes Cardiovascular: Denies palpitation, chest discomfort or lower extremity swelling Gastrointestinal:  Denies nausea, heartburn or change in bowel habits Skin: Denies abnormal skin rashes Lymphatics: Denies new lymphadenopathy or easy bruising Neurological:Denies numbness, tingling or new weaknesses Behavioral/Psych: Mood is stable, no new changes  All other systems were reviewed with the patient and are negative.  I have reviewed the past medical history, past surgical history, social history and family history with the patient and they are unchanged from previous note.  ALLERGIES:  is allergic to morphine and related; multaq [dronedarone]; demerol; oysters [shellfish allergy]; penicillins; and sulfa drugs  cross reactors.  MEDICATIONS:  Current Outpatient Prescriptions  Medication Sig Dispense Refill  . acetaminophen (TYLENOL) 500 MG tablet Take 1,000 mg by mouth every 6 (six) hours as needed for moderate pain, fever or headache.    . albuterol (PROVENTIL HFA;VENTOLIN HFA) 108 (90 Base) MCG/ACT inhaler Inhale into the lungs as directed.    Marland Kitchen albuterol (PROVENTIL) (2.5 MG/3ML) 0.083% nebulizer solution Inhale 3 mLs into the lungs every 6 (six) hours as needed for wheezing or shortness of breath.     . anastrozole (ARIMIDEX) 1 MG tablet TAKE ONE TABLET BY MOUTH EVERY DAY 90 tablet 9  . Cholecalciferol (VITAMIN D) 2000 UNITS tablet Take 2,000 Units by mouth daily with lunch.    . diclofenac sodium (VOLTAREN) 1 % GEL Apply 1 application topically 4 (four) times daily as needed.    . diclofenac sodium (VOLTAREN) 1 % GEL Apply 1 application topically 4 (four) times  daily.    . diltiazem (TIAZAC) 120 MG 24 hr capsule Take 120 mg by mouth daily.    Marland Kitchen doxycycline (VIBRAMYCIN) 100 MG capsule Take 100 mg by mouth daily.    . Fluticasone-Salmeterol (ADVAIR) 250-50 MCG/DOSE AEPB Inhale 2 puffs into the lungs every 12 (twelve) hours.     . hydrochlorothiazide (HYDRODIURIL) 25 MG tablet Take 37.5 mg by mouth daily as needed (as needed for leg swelling).    Marland Kitchen levothyroxine (SYNTHROID, LEVOTHROID) 200 MCG tablet Take 200 mcg by mouth daily before breakfast.    . lidocaine-prilocaine (EMLA) cream Apply 1 application topically as needed. Apply to [facility] a Cath site at least one hour prior to needle stick as needed. 30 g 10  . Magnesium Oxide 500 MG TABS Take 500 mg by mouth daily.     . metFORMIN (GLUCOPHAGE-XR) 500 MG 24 hr tablet Take 500 mg by mouth 2 (two) times daily with a meal.    . montelukast (SINGULAIR) 10 MG tablet Take 10 mg by mouth daily.    . Omega-3-acid Ethyl Esters (LOVAZA PO) Take 1 capsule by mouth 2 (two) times daily.    . ondansetron (ZOFRAN) 8 MG tablet Take 1 tablet (8 mg total) by mouth every  8 (eight) hours as needed for nausea. (Patient not taking: Reported on 07/30/2016) 60 tablet 3  . pravastatin (PRAVACHOL) 20 MG tablet 1/2 tab mon-thurs    . rivaroxaban (XARELTO) 20 MG TABS tablet Take 20 mg by mouth daily with supper.    . traMADol-acetaminophen (ULTRACET) 37.5-325 MG tablet Take 1 tablet by mouth every 6 (six) hours as needed (Pain).    . triamterene-hydrochlorothiazide (MAXZIDE-25) 37.5-25 MG tablet Take 1 tablet by mouth daily.    . vitamin B-12 (CYANOCOBALAMIN) 500 MCG tablet Take 500 mcg by mouth daily.    . vitamin B-12 (CYANOCOBALAMIN) 500 MCG tablet Take 500 mcg by mouth daily.     No current facility-administered medications for this visit.     PHYSICAL EXAMINATION: ECOG PERFORMANCE STATUS: 1 - Symptomatic but completely ambulatory  Vitals:   12/15/16 1047  BP: 139/63  Pulse: 92  Resp: 18  Temp: 98.5 F (36.9 C)  SpO2: 100%   Filed Weights   12/15/16 1047  Weight: 143 lb 8 oz (65.1 kg)    GENERAL:alert, no distress and comfortable SKIN: skin color, texture, turgor are normal, no rashes or significant lesions EYES: normal, Conjunctiva are pink and non-injected, sclera clear OROPHARYNX:no exudate, no erythema and lips, buccal mucosa, and tongue normal  NECK: supple, thyroid normal size, non-tender, without nodularity LYMPH:  no palpable lymphadenopathy in the cervical, axillary or inguinal LUNGS: clear to auscultation and percussion with normal breathing effort HEART: regular rate & rhythm and no murmurs and no lower extremity edema ABDOMEN:abdomen soft, non-tender and normal bowel sounds Musculoskeletal:no cyanosis of digits and no clubbing  NEURO: alert & oriented x 3 with fluent speech, no focal motor/sensory deficits  LABORATORY DATA:  I have reviewed the data as listed    Component Value Date/Time   NA 140 12/02/2016 1307   K 3.7 12/02/2016 1307   CL 108 08/20/2015 0430   CO2 24 12/02/2016 1307   GLUCOSE 126 12/02/2016 1307   BUN 16.8  12/02/2016 1307   CREATININE 0.8 12/02/2016 1307   CALCIUM 10.0 12/02/2016 1307   PROT 6.7 12/02/2016 1307   ALBUMIN 4.1 12/02/2016 1307   AST 14 12/02/2016 1307   ALT 13 12/02/2016 1307   ALKPHOS 70 12/02/2016 1307  BILITOT 0.37 12/02/2016 1307   GFRNONAA >60 08/20/2015 0430   GFRAA >60 08/20/2015 0430    No results found for: SPEP, UPEP  Lab Results  Component Value Date   WBC 7.3 12/02/2016   NEUTROABS 5.2 12/02/2016   HGB 10.1 (L) 12/02/2016   HCT 30.9 (L) 12/02/2016   MCV 85.6 12/02/2016   PLT 188 12/02/2016      Chemistry      Component Value Date/Time   NA 140 12/02/2016 1307   K 3.7 12/02/2016 1307   CL 108 08/20/2015 0430   CO2 24 12/02/2016 1307   BUN 16.8 12/02/2016 1307   CREATININE 0.8 12/02/2016 1307      Component Value Date/Time   CALCIUM 10.0 12/02/2016 1307   ALKPHOS 70 12/02/2016 1307   AST 14 12/02/2016 1307   ALT 13 12/02/2016 1307   BILITOT 0.37 12/02/2016 1307      ASSESSMENT & PLAN:  Breast cancer of upper-inner quadrant of right female breast ([place]) She is taking Arimidex since 07/14/2015 as part of the adjuvant treatment for breast cancer. So far, she tolerated treatment well without any side effects. I will continue to see her on the regular basis including breast examination and will make sure that she get regular screening mammogram Her recent screening mammogram in February 2018 was normal She will continue Arimidex for  5 years.  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 We discussed some of the risks, benefits, and  alternatives of intravenous iron infusions. The patient is symptomatic from anemia and the iron level is critically low. She tolerated oral iron supplement poorly and desires to achieved higher levels of iron faster for adequate hematopoesis. Some of the side-effects to be expected including risks of infusion reactions, phlebitis, headaches, nausea and fatigue.  The patient is willing to proceed. Patient education material was dispensed.  Goal is to keep ferritin level greater than 50    No orders of the defined types were placed in this encounter.  All questions were answered. The patient knows to call the clinic with any problems, questions or concerns. No barriers to learning was detected. I spent 15 minutes counseling the patient face to face. The total time spent in the appointment was 20 minutes and more than 50% was on counseling and review of test results     Heath Lark, MD 12/15/2016 10:53 AM

## 2016-12-15 NOTE — Assessment & Plan Note (Signed)
CT scan from August 2018 showed no evidence of active disease She has completed chemotherapy treatment I recommend history and physical examination next month and plan to repeat imaging again in 1 year, due August 2019 In the meantime, I will maintain port patency with port flushes

## 2016-12-15 NOTE — Patient Instructions (Addendum)
Anemia, Nonspecific Anemia is a condition in which the concentration of red blood cells or hemoglobin in the blood is below normal. Hemoglobin is a substance in red blood cells that carries oxygen to the tissues of the body. Anemia results in not enough oxygen reaching these tissues. What are the causes? Common causes of anemia include:  Excessive bleeding. Bleeding may be internal or external. This includes excessive bleeding from periods (in women) or from the intestine.  Poor nutrition.  Chronic kidney, thyroid, and liver disease.  Bone marrow disorders that decrease red blood cell production.  Cancer and treatments for cancer.  HIV, AIDS, and their treatments.  Spleen problems that increase red blood cell destruction.  Blood disorders.  Excess destruction of red blood cells due to infection, medicines, and autoimmune disorders.  What are the signs or symptoms?  Minor weakness.  Dizziness.  Headache.  Palpitations.  Shortness of breath, especially with exercise.  Paleness.  Cold sensitivity.  Indigestion.  Nausea.  Difficulty sleeping.  Difficulty concentrating. Symptoms may occur suddenly or they may develop slowly. How is this diagnosed? Additional blood tests are often needed. These help your health care provider determine the best treatment. Your health care provider will check your stool for blood and look for other causes of blood loss. How is this treated? Treatment varies depending on the cause of the anemia. Treatment can include:  Supplements of iron, vitamin F62, or folic acid.  Hormone medicines.  A blood transfusion. This may be needed if blood loss is severe.  Hospitalization. This may be needed if there is significant continual blood loss.  Dietary changes.  Spleen removal.  Follow these instructions at home: Keep all follow-up appointments. It often takes many weeks to correct anemia, and having your health care provider check on  your condition and your response to treatment is very important. Get help right away if:  You develop extreme weakness, shortness of breath, or chest pain.  You become dizzy or have trouble concentrating.  You develop heavy vaginal bleeding.  You develop a rash.  You have bloody or black, tarry stools.  You faint.  You vomit up blood.  You vomit repeatedly.  You have abdominal pain.  You have a fever or persistent symptoms for more than 2-3 days.  You have a fever and your symptoms suddenly get worse.  You are dehydrated. This information is not intended to replace advice given to you by your health care provider. Make sure you discuss any questions you have with your health care provider. Document Released: 03/25/2004 Document Revised: 07/30/2015 Document Reviewed: 08/11/2012 Elsevier Interactive Patient Education  2017 [place] [employer] What is this medicine? FERUMOXYTOL is an iron complex. Iron is used to make healthy red blood cells, which carry oxygen and nutrients throughout the body. This medicine is used to treat iron deficiency anemia in people with chronic kidney disease. This medicine may be used for other purposes; ask your health care provider or pharmacist if you have questions. COMMON BRAND NAME(S): Feraheme What should I tell my health care provider before I take this medicine? They need to know if you have any of these conditions: -anemia not caused by low iron levels -high levels of iron in the blood -magnetic resonance imaging (MRI) test scheduled -an unusual or allergic reaction to iron, other medicines, foods, dyes, or preservatives -pregnant or trying to get pregnant -breast-feeding How should I use this medicine? This medicine is for injection into a vein. It is  given by a health care professional in a hospital or clinic setting. Talk to your pediatrician regarding the use of this medicine in children. Special care may be  needed. Overdosage: If you think you have taken too much of this medicine contact a poison control center or emergency room at once. NOTE: This medicine is only for you. Do not share this medicine with others. What if I miss a dose? It is important not to miss your dose. Call your doctor or health care professional if you are unable to keep an appointment. What may interact with this medicine? This medicine may interact with the following medications: -other iron products This list may not describe all possible interactions. Give your health care provider a list of all the medicines, herbs, non-prescription drugs, or dietary supplements you use. Also tell them if you smoke, drink alcohol, or use illegal drugs. Some items may interact with your medicine. What should I watch for while using this medicine? Visit your doctor or healthcare professional regularly. Tell your doctor or healthcare professional if your symptoms do not start to get better or if they get worse. You may need blood work done while you are taking this medicine. You may need to follow a special diet. Talk to your doctor. Foods that contain iron include: whole grains/cereals, dried fruits, beans, or peas, leafy green vegetables, and organ meats (liver, kidney). What side effects may I notice from receiving this medicine? Side effects that you should report to your doctor or health care professional as soon as possible: -allergic reactions like skin rash, itching or hives, swelling of the face, lips, or tongue -breathing problems -changes in blood pressure -feeling faint or lightheaded, falls -fever or chills -flushing, sweating, or hot feelings -swelling of the ankles or feet Side effects that usually do not require medical attention (report to your doctor or health care professional if they continue or are bothersome): -diarrhea -headache -nausea, vomiting -stomach pain This list may not describe all possible side effects.  Call your doctor for medical advice about side effects. You may report side effects to FDA at 1-800-FDA-1088. Where should I keep my medicine? This drug is given in a hospital or clinic and will not be stored at home. NOTE: This sheet is a summary. It may not cover all possible information. If you have questions about this medicine, talk to your doctor, pharmacist, or health care provider.  2018 Elsevier/Gold Standard (2015-03-20 12:41:49)

## 2016-12-15 NOTE — Assessment & Plan Note (Addendum)
She is taking Arimidex since 07/14/2015 as part of the adjuvant treatment for breast cancer. So far, she tolerated treatment well without any side effects. I will continue to see her on the regular basis including breast examination and will make sure that she get regular screening mammogram Her recent screening mammogram in February 2018 was normal She will continue Arimidex for  5 years.

## 2016-12-15 NOTE — Assessment & Plan Note (Signed)
The most likely cause of her anemia is due to chronic blood loss/malabsorption syndrome. We discussed some of the risks, benefits, and alternatives of intravenous iron infusions. The patient is symptomatic from anemia and the iron level is critically low. She tolerated oral iron supplement poorly and desires to achieved higher levels of iron faster for adequate hematopoesis. Some of the side-effects to be expected including risks of infusion reactions, phlebitis, headaches, nausea and fatigue.  The patient is willing to proceed. Patient education material was dispensed.  Goal is to keep ferritin level greater than 50  

## 2016-12-22 ENCOUNTER — Ambulatory Visit (HOSPITAL_BASED_OUTPATIENT_CLINIC_OR_DEPARTMENT_OTHER): Payer: Medicare Other

## 2016-12-22 VITALS — BP 129/62 | HR 73 | Temp 98.7°F | Resp 18

## 2016-12-22 DIAGNOSIS — D509 Iron deficiency anemia, unspecified: Secondary | ICD-10-CM

## 2016-12-22 DIAGNOSIS — C8591 Non-Hodgkin lymphoma, unspecified, lymph nodes of head, face, and neck: Secondary | ICD-10-CM

## 2016-12-22 DIAGNOSIS — D801 Nonfamilial hypogammaglobulinemia: Secondary | ICD-10-CM

## 2016-12-22 MED ORDER — HEPARIN SOD (PORK) LOCK FLUSH 100 UNIT/ML IV SOLN
500.0000 [IU] | Freq: Once | INTRAVENOUS | Status: AC | PRN
  Administered 2016-12-22: 500 [IU]
  Filled 2016-12-22: qty 5

## 2016-12-22 MED ORDER — SODIUM CHLORIDE 0.9 % IV SOLN
510.0000 mg | Freq: Once | INTRAVENOUS | Status: AC
  Administered 2016-12-22: 510 mg via INTRAVENOUS
  Filled 2016-12-22: qty 17

## 2016-12-22 MED ORDER — SODIUM CHLORIDE 0.9% FLUSH
10.0000 mL | INTRAVENOUS | Status: DC | PRN
  Administered 2016-12-22: 10 mL
  Filled 2016-12-22: qty 10

## 2016-12-22 MED ORDER — SODIUM CHLORIDE 0.9 % IV SOLN
Freq: Once | INTRAVENOUS | Status: AC
  Administered 2016-12-22: 10:00:00 via INTRAVENOUS

## 2016-12-22 NOTE — Patient Instructions (Signed)

## 2016-12-30 DIAGNOSIS — D225 Melanocytic nevi of trunk: Secondary | ICD-10-CM | POA: Diagnosis not present

## 2016-12-30 DIAGNOSIS — L821 Other seborrheic keratosis: Secondary | ICD-10-CM | POA: Diagnosis not present

## 2016-12-30 DIAGNOSIS — D1801 Hemangioma of skin and subcutaneous tissue: Secondary | ICD-10-CM | POA: Diagnosis not present

## 2016-12-30 DIAGNOSIS — L820 Inflamed seborrheic keratosis: Secondary | ICD-10-CM | POA: Diagnosis not present

## 2017-01-06 DIAGNOSIS — M5116 Intervertebral disc disorders with radiculopathy, lumbar region: Secondary | ICD-10-CM | POA: Diagnosis not present

## 2017-01-06 DIAGNOSIS — R29898 Other symptoms and signs involving the musculoskeletal system: Secondary | ICD-10-CM | POA: Diagnosis not present

## 2017-01-06 DIAGNOSIS — E1142 Type 2 diabetes mellitus with diabetic polyneuropathy: Secondary | ICD-10-CM | POA: Diagnosis not present

## 2017-01-10 DIAGNOSIS — D509 Iron deficiency anemia, unspecified: Secondary | ICD-10-CM | POA: Diagnosis not present

## 2017-01-13 DIAGNOSIS — G4730 Sleep apnea, unspecified: Secondary | ICD-10-CM | POA: Diagnosis not present

## 2017-01-13 DIAGNOSIS — D509 Iron deficiency anemia, unspecified: Secondary | ICD-10-CM | POA: Diagnosis not present

## 2017-01-13 DIAGNOSIS — Z9989 Dependence on other enabling machines and devices: Secondary | ICD-10-CM | POA: Diagnosis not present

## 2017-01-14 ENCOUNTER — Encounter: Payer: Self-pay | Admitting: Obstetrics & Gynecology

## 2017-01-14 ENCOUNTER — Ambulatory Visit (INDEPENDENT_AMBULATORY_CARE_PROVIDER_SITE_OTHER): Payer: Medicare Other | Admitting: Obstetrics & Gynecology

## 2017-01-14 VITALS — BP 122/78 | Ht 65.5 in | Wt 140.0 lb

## 2017-01-14 DIAGNOSIS — Z780 Asymptomatic menopausal state: Secondary | ICD-10-CM | POA: Diagnosis not present

## 2017-01-14 DIAGNOSIS — Z9189 Other specified personal risk factors, not elsewhere classified: Secondary | ICD-10-CM | POA: Diagnosis not present

## 2017-01-14 DIAGNOSIS — C50211 Malignant neoplasm of upper-inner quadrant of right female breast: Secondary | ICD-10-CM

## 2017-01-14 DIAGNOSIS — M8580 Other specified disorders of bone density and structure, unspecified site: Secondary | ICD-10-CM

## 2017-01-14 DIAGNOSIS — Z90710 Acquired absence of both cervix and uterus: Secondary | ICD-10-CM | POA: Diagnosis not present

## 2017-01-14 DIAGNOSIS — N952 Postmenopausal atrophic vaginitis: Secondary | ICD-10-CM | POA: Diagnosis not present

## 2017-01-14 DIAGNOSIS — C8591 Non-Hodgkin lymphoma, unspecified, lymph nodes of head, face, and neck: Secondary | ICD-10-CM | POA: Diagnosis not present

## 2017-01-14 DIAGNOSIS — I480 Paroxysmal atrial fibrillation: Secondary | ICD-10-CM

## 2017-01-14 DIAGNOSIS — Z01411 Encounter for gynecological examination (general) (routine) with abnormal findings: Secondary | ICD-10-CM | POA: Diagnosis not present

## 2017-01-14 DIAGNOSIS — Z170 Estrogen receptor positive status [ER+]: Secondary | ICD-10-CM

## 2017-01-14 DIAGNOSIS — Z124 Encounter for screening for malignant neoplasm of cervix: Secondary | ICD-10-CM | POA: Diagnosis not present

## 2017-01-14 NOTE — Progress Notes (Signed)
  Patty [name] 12/17/1940 [id number]   History:    76 y.o. G2P2 Married  RP:  Established patient presenting  for annual gyn exam  HPI: Menopause.  No hormone replacement therapy.  Status post total abdominal hysterectomy with ovarian preservation.  Abstinent.  Malignant lymphoma 2015.  Right breast cancer 2017 on Arimidex.  Status post right mastectomy.  Diabetes mellitus.  Has neuropathies.  Has to catheterize her bladder twice a day.  On doxycycline to prevent bladder infections.  Atrial Fibrillation.  Had a Cardioversion 09/2016.  Stays active.  Enjoys reading.  Body mass index 22.94.  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  Gynecologic History No LMP recorded. Patient has had a hysterectomy. Contraception: status post hysterectomy Last Pap: 2010. Results were: normal Last mammogram: 04/2016. Results were: Negative [facility] 2011 Dexa 10/2016, will obtain result  Obstetric History OB History  Gravida Para Term Preterm AB Living  2 2 2     2  SAB TAB Ectopic Multiple Live Births               # Outcome Date GA Lbr Len/2nd Weight Sex Delivery Anes PTL Lv  2 Term           1 Term                ROS: A ROS was performed and pertinent positives and negatives are included in the history.  GENERAL: No fevers or chills. HEENT: No change in vision, no earache, sore throat or sinus congestion. NECK: No pain or stiffness. CARDIOVASCULAR: No chest pain or pressure. No palpitations. PULMONARY: No  shortness of breath, cough or wheeze. GASTROINTESTINAL: No abdominal pain, nausea, vomiting or diarrhea, melena or bright red blood per rectum. GENITOURINARY: No urinary frequency, urgency, hesitancy or dysuria. MUSCULOSKELETAL: No joint or muscle pain, no back pain, no recent trauma. DERMATOLOGIC: No rash, no itching, no lesions. ENDOCRINE: No polyuria, polydipsia, no heat or cold intolerance. No recent change in weight. HEMATOLOGICAL: No anemia or easy bruising or bleeding. NEUROLOGIC: No headache, seizures, numbness, tingling or weakness. PSYCHIATRIC: No depression, no loss of interest in normal activity or change in sleep pattern.     Exam:   BP 122/78   Ht 5' 5.5" (1.664 m)   Wt 140 lb (63.5 kg)   BMI 22.94 kg/m   Body mass index is 22.94 kg/m.  General appearance : Well developed well nourished female. No acute distress HEENT: Eyes: no retinal hemorrhage or exudates,  Neck supple, trachea midline, no carotid bruits, no thyroidmegaly Lungs: Clear to auscultation, no rhonchi or wheezes, or rib retractions  Heart: Regular rate and rhythm, no murmurs or gallops Breast:Examined in sitting and supine position.  Right Mastectomy.  Left no palpable masses or tenderness,  no skin retraction, no nipple inversion, no nipple discharge, no skin discoloration, no axillary or supraclavicular lymphadenopathy bilaterally Abdomen: no palpable masses or tenderness, no rebound or guarding Extremities: no edema or skin discoloration or tenderness    Pelvic: Vulva normal  Bartholin, Urethra, Skene Glands: Within normal limits             Vagina: No gross lesions or discharge.  Pap reflex done.  Cervix/Uterus absent  Adnexa  Without masses or tenderness  Anus and perineum  normal    Assessment/Plan:  76 y.o. female for annual exam   1. Encounter for gynecological examination with abnormal finding Gynecologic exam status post total abdominal hysterectomy.  Pap reflex done today.  Up to date with  [facility].  2. Menopause present No HRT.  Well tolerated.  3. Post-menopausal atrophic vaginitis Not sexually active, asymptomatic.  D/Ced local vaginal Estrogen Rx at Breast Ca Dx 2017.  4. Osteopenia, unspecified location Will obtain recent Bone Density result.  Vit D supplement, Ca++ in diet, wt bearing physical activity  5. S/P TAH (total abdominal hysterectomy)   6. 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 Malignant neoplasm of upper-inner quadrant of right breast in female, estrogen receptor positive (HCC) S/P Rt Mastectomy.  8. Paroxysmal atrial fibrillation (HCC) Cardioconversion 09/2016, currently stable  9. Cervical cancer screening  - Pap IG w/ reflex to HPV when [facility]  Counseling on above issues >50% x 15 minutes  [place]  MD, 10:04 AM 01/14/2017   

## 2017-01-15 ENCOUNTER — Encounter: Payer: Self-pay | Admitting: Obstetrics & Gynecology

## 2017-01-15 NOTE — Patient Instructions (Signed)
1. Encounter for gynecological examination with abnormal finding Gynecologic exam status post total abdominal hysterectomy.  Pap reflex done today.  Up to date with [facility].  2. Menopause present No HRT.  Well tolerated.  3. Post-menopausal atrophic vaginitis Not sexually active, asymptomatic.  D/Ced local vaginal Estrogen Rx at Breast Ca Dx 2017.  4. Osteopenia, unspecified location Will obtain recent Bone Density result.  Vit D supplement, Ca++ in diet, wt bearing physical activity  5. S/P TAH (total abdominal hysterectomy)   6. 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, face, and neck ([place]) Followed by Oncology  7. Malignant neoplasm of upper-inner quadrant of right breast in female, estrogen receptor positive ([place]) S/P Rt Mastectomy.  8. Paroxysmal atrial fibrillation ([place]) Cardioconversion 09/2016, currently stable  9. Cervical cancer screening  - Pap IG w/ reflex to HPV when [facility]  [place], it was a pleasure meeting you today!  I will inform you of your results as soon as available.   Health Maintenance for Postmenopausal Women Menopause is a normal process in which your reproductive ability comes to an end. This process happens gradually over a span of months to years, usually between the ages of 14 and 22. Menopause is complete when you have missed 12 consecutive menstrual periods. It is important to talk with your health care provider about some of the most common conditions that affect postmenopausal women, such as heart disease, cancer, and bone loss (osteoporosis). Adopting a healthy lifestyle and getting preventive care can help to promote your health and wellness. Those actions can also lower your chances of developing some of these common conditions. What should I know about menopause? During menopause, you may experience a number of symptoms, such as:  Moderate-to-severe hot flashes.  Night sweats.  Decrease in sex drive.  Mood  swings.  Headaches.  Tiredness.  Irritability.  Memory problems.  Insomnia.  Choosing to treat or not to treat menopausal changes is an individual decision that you make with your health care provider. What should I know about hormone replacement therapy and supplements? Hormone therapy products are effective for treating symptoms that are associated with menopause, such as hot flashes and night sweats. Hormone replacement carries certain risks, especially as you become older. If you are thinking about using estrogen or estrogen with progestin treatments, discuss the benefits and risks with your health care provider. What should I know about heart disease and stroke? Heart disease, heart attack, and stroke become more likely as you age. This may be due, in part, to the hormonal changes that your body experiences during menopause. These can affect how your body processes dietary fats, triglycerides, and cholesterol. Heart attack and stroke are both medical emergencies. There are many things that you can do to help prevent heart disease and stroke:  Have your blood pressure checked at least every 1-2 years. High blood pressure causes heart disease and increases the risk of stroke.  If you are 51-62 years old, ask your health care provider if you should take aspirin to prevent a heart attack or a stroke.  Do not use any tobacco products, including cigarettes, chewing tobacco, or electronic cigarettes. If you need help quitting, ask your health care provider.  It is important to eat a healthy diet and maintain a healthy weight. ? Be sure to include plenty of vegetables, fruits, low-fat dairy products, and lean protein. ? Avoid eating foods that are high in solid fats, added sugars, or salt (sodium).  Get regular exercise. This is one  of the most important things that you can do for your health. ? Try to exercise for at least 150 minutes each week. The type of exercise that you do should  increase your heart rate and make you sweat. This is known as moderate-intensity exercise. ? Try to do strengthening exercises at least twice each week. Do these in addition to the moderate-intensity exercise.  Know your numbers.Ask your health care provider to check your cholesterol and your blood glucose. Continue to have your blood tested as directed by your health care provider.  What should I know about cancer screening? There are several types of cancer. Take the following steps to reduce your risk and to catch any cancer development as early as possible. Breast Cancer  Practice breast self-awareness. ? This means understanding how your breasts normally appear and feel. ? It also means doing regular breast self-exams. Let your health care provider know about any changes, no matter how small.  If you are 46 or older, have a clinician do a breast exam (clinical breast exam or CBE) every year. Depending on your age, family history, and medical history, it may be recommended that you also have a yearly breast X-ray (mammogram).  If you have a family history of breast cancer, talk with your health care provider about genetic screening.  If you are at high risk for breast cancer, talk with your health care provider about having an MRI and a mammogram every year.  Breast cancer (BRCA) gene test is recommended for women who have family members with BRCA-related cancers. Results of the assessment will determine the need for genetic counseling and BRCA1 and for BRCA2 testing. BRCA-related cancers include these types: ? Breast. This occurs in males or females. ? Ovarian. ? Tubal. This may also be called fallopian tube cancer. ? Cancer of the abdominal or pelvic lining (peritoneal cancer). ? Prostate. ? Pancreatic.  Cervical, Uterine, and Ovarian Cancer Your health care provider may recommend that you be screened regularly for cancer of the pelvic organs. These include your ovaries, uterus,  and vagina. This screening involves a pelvic exam, which includes checking for microscopic changes to the surface of your cervix (Pap test).  For women ages 21-65, health care providers may recommend a pelvic exam and a Pap test every three years. For women ages 24-65, they may recommend the Pap test and pelvic exam, combined with testing for human papilloma virus (HPV), every five years. Some types of HPV increase your risk of cervical cancer. Testing for HPV may also be done on women of any age who have unclear Pap test results.  Other health care providers may not recommend any screening for nonpregnant women who are considered low risk for pelvic cancer and have no symptoms. Ask your health care provider if a screening pelvic exam is right for you.  If you have had past treatment for cervical cancer or a condition that could lead to cancer, you need Pap tests and screening for cancer for at least 20 years after your treatment. If Pap tests have been discontinued for you, your risk factors (such as having a new sexual partner) need to be reassessed to determine if you should start having screenings again. Some women have medical problems that increase the chance of getting cervical cancer. In these cases, your health care provider may recommend that you have screening and Pap tests more often.  If you have a family history of uterine cancer or ovarian cancer, talk with your health care  provider about genetic screening.  If you have vaginal bleeding after reaching menopause, tell your health care provider.  There are currently no reliable tests available to screen for ovarian cancer.  Lung Cancer Lung cancer screening is recommended for adults 66-51 years old who are at high risk for lung cancer because of a history of smoking. A yearly low-dose CT scan of the lungs is recommended if you:  Currently smoke.  Have a history of at least 30 pack-years of smoking and you currently smoke or have quit  within the past 15 years. A pack-year is smoking an average of one pack of cigarettes per day for one year.  Yearly screening should:  Continue until it has been 15 years since you quit.  Stop if you develop a health problem that would prevent you from having lung cancer treatment.  Colorectal Cancer  This type of cancer can be detected and can often be prevented.  Routine colorectal cancer screening usually begins at age 93 and continues through age 26.  If you have risk factors for colon cancer, your health care provider may recommend that you be screened at an earlier age.  If you have a family history of colorectal cancer, talk with your health care provider about genetic screening.  Your health care provider may also recommend using home test kits to check for hidden blood in your stool.  A small camera at the end of a tube can be used to examine your colon directly (sigmoidoscopy or colonoscopy). This is done to check for the earliest forms of colorectal cancer.  Direct examination of the colon should be repeated every 5-10 years until age 21. However, if early forms of precancerous polyps or small growths are found or if you have a family history or genetic risk for colorectal cancer, you may need to be screened more often.  Skin Cancer  Check your skin from head to toe regularly.  Monitor any moles. Be sure to tell your health care provider: ? About any new moles or changes in moles, especially if there is a change in a mole's shape or color. ? If you have a mole that is larger than the size of a pencil eraser.  If any of your family members has a history of skin cancer, especially at a young age, talk with your health care provider about genetic screening.  Always use sunscreen. Apply sunscreen liberally and repeatedly throughout the day.  Whenever you are outside, protect yourself by wearing long sleeves, pants, a wide-brimmed hat, and sunglasses.  What should I know  about osteoporosis? Osteoporosis is a condition in which bone destruction happens more quickly than new bone creation. After menopause, you may be at an increased risk for osteoporosis. To help prevent osteoporosis or the bone fractures that can happen because of osteoporosis, the following is recommended:  If you are 86-59 years old, get at least 1,000 mg of calcium and at least 600 mg of vitamin D per day.  If you are older than age 70 but younger than age 78, get at least 1,200 mg of calcium and at least 600 mg of vitamin D per day.  If you are older than age 80, get at least 1,200 mg of calcium and at least 800 mg of vitamin D per day.  Smoking and excessive alcohol intake increase the risk of osteoporosis. Eat foods that are rich in calcium and vitamin D, and do weight-bearing exercises several times each week as directed by your  health care provider. What should I know about how menopause affects my mental health? Depression may occur at any age, but it is more common as you become older. Common symptoms of depression include:  Low or sad mood.  Changes in sleep patterns.  Changes in appetite or eating patterns.  Feeling an overall lack of motivation or enjoyment of activities that you previously enjoyed.  Frequent crying spells.  Talk with your health care provider if you think that you are experiencing depression. What should I know about immunizations? It is important that you get and maintain your immunizations. These include:  Tetanus, diphtheria, and pertussis (Tdap) booster vaccine.  Influenza every year before the flu season begins.  Pneumonia vaccine.  Shingles vaccine.  Your health care provider may also recommend other immunizations. This information is not intended to replace advice given to you by your health care provider. Make sure you discuss any questions you have with your health care provider. Document Released: 04/09/2005 Document Revised: 09/05/2015  Document Reviewed: 11/19/2014 Elsevier Interactive Patient Education  2018 [employer].

## 2017-01-19 LAB — PAP IG W/ RFLX HPV ASCU

## 2017-01-27 ENCOUNTER — Ambulatory Visit: Payer: Medicare Other

## 2017-01-27 ENCOUNTER — Telehealth: Payer: Self-pay | Admitting: Hematology and Oncology

## 2017-01-27 ENCOUNTER — Ambulatory Visit (HOSPITAL_BASED_OUTPATIENT_CLINIC_OR_DEPARTMENT_OTHER): Payer: Medicare Other | Admitting: Hematology and Oncology

## 2017-01-27 ENCOUNTER — Other Ambulatory Visit (HOSPITAL_BASED_OUTPATIENT_CLINIC_OR_DEPARTMENT_OTHER): Payer: Medicare Other

## 2017-01-27 DIAGNOSIS — Z170 Estrogen receptor positive status [ER+]: Secondary | ICD-10-CM | POA: Diagnosis not present

## 2017-01-27 DIAGNOSIS — D801 Nonfamilial hypogammaglobulinemia: Secondary | ICD-10-CM

## 2017-01-27 DIAGNOSIS — C50211 Malignant neoplasm of upper-inner quadrant of right female breast: Secondary | ICD-10-CM | POA: Diagnosis not present

## 2017-01-27 DIAGNOSIS — D500 Iron deficiency anemia secondary to blood loss (chronic): Secondary | ICD-10-CM | POA: Diagnosis not present

## 2017-01-27 DIAGNOSIS — C8591 Non-Hodgkin lymphoma, unspecified, lymph nodes of head, face, and neck: Secondary | ICD-10-CM | POA: Diagnosis not present

## 2017-01-27 DIAGNOSIS — D509 Iron deficiency anemia, unspecified: Secondary | ICD-10-CM

## 2017-01-27 LAB — CBC WITH DIFFERENTIAL/PLATELET
BASO%: 0.7 % (ref 0.0–2.0)
BASOS ABS: 0 10*3/uL (ref 0.0–0.1)
EOS ABS: 0.1 10*3/uL (ref 0.0–0.5)
EOS%: 1.6 % (ref 0.0–7.0)
HCT: 33.4 % — ABNORMAL LOW (ref 34.8–46.6)
HGB: 11.5 g/dL — ABNORMAL LOW (ref 11.6–15.9)
LYMPH%: 18.3 % (ref 14.0–49.7)
MCH: 29.8 pg (ref 25.1–34.0)
MCHC: 34.3 g/dL (ref 31.5–36.0)
MCV: 86.9 fL (ref 79.5–101.0)
MONO#: 0.6 10*3/uL (ref 0.1–0.9)
MONO%: 8.8 % (ref 0.0–14.0)
NEUT#: 4.7 10*3/uL (ref 1.5–6.5)
NEUT%: 70.6 % (ref 38.4–76.8)
Platelets: 211 10*3/uL (ref 145–400)
RBC: 3.84 10*6/uL (ref 3.70–5.45)
RDW: 17.3 % — ABNORMAL HIGH (ref 11.2–14.5)
WBC: 6.7 10*3/uL (ref 3.9–10.3)
lymph#: 1.2 10*3/uL (ref 0.9–3.3)

## 2017-01-27 LAB — COMPREHENSIVE METABOLIC PANEL
ALT: 15 U/L (ref 0–55)
AST: 15 U/L (ref 5–34)
Albumin: 4.4 g/dL (ref 3.5–5.0)
Alkaline Phosphatase: 77 U/L (ref 40–150)
Anion Gap: 11 mEq/L (ref 3–11)
BUN: 16.9 mg/dL (ref 7.0–26.0)
CALCIUM: 9.9 mg/dL (ref 8.4–10.4)
CHLORIDE: 105 meq/L (ref 98–109)
CO2: 25 meq/L (ref 22–29)
CREATININE: 0.8 mg/dL (ref 0.6–1.1)
EGFR: >60 mL/min/{1.73_m2} (ref >60)
Glucose: 122 mg/dl (ref 70–140)
Potassium: 3.9 mEq/L (ref 3.5–5.1)
Sodium: 140 mEq/L (ref 136–145)
Total Bilirubin: 0.65 mg/dL (ref 0.20–1.20)
Total Protein: 6.9 g/dL (ref 6.4–8.3)

## 2017-01-27 LAB — FERRITIN: Ferritin: 286 ng/ml — ABNORMAL HIGH (ref 9–269)

## 2017-01-27 LAB — IRON AND TIBC
%SAT: 26 % (ref 21–57)
Iron: 75 ug/dL (ref 41–142)
TIBC: 284 ug/dL (ref 236–444)
UIBC: 209 ug/dL (ref 120–384)

## 2017-01-27 MED ORDER — HEPARIN SOD (PORK) LOCK FLUSH 100 UNIT/ML IV SOLN
500.0000 [IU] | Freq: Once | INTRAVENOUS | Status: AC | PRN
  Administered 2017-01-27: 500 [IU] via INTRAVENOUS
  Filled 2017-01-27: qty 5

## 2017-01-27 MED ORDER — SODIUM CHLORIDE 0.9 % IJ SOLN
10.0000 mL | INTRAMUSCULAR | Status: DC | PRN
  Administered 2017-01-27: 10 mL via INTRAVENOUS
  Filled 2017-01-27: qty 10

## 2017-01-27 MED ORDER — ONDANSETRON HCL 8 MG PO TABS: 8.0000 mg | ORAL_TABLET | Freq: Three times a day (TID) | ORAL | 3 refills | Status: DC | PRN

## 2017-01-27 NOTE — Telephone Encounter (Signed)
Gave avs and calendar for January 2019 °

## 2017-01-28 ENCOUNTER — Encounter: Payer: Self-pay | Admitting: Hematology and Oncology

## 2017-01-28 LAB — IGG, IGA, IGM
IGG (IMMUNOGLOBIN G), SERUM: 478 mg/dL — AB (ref 700–1600)
IGM (IMMUNOGLOBIN M), SRM: 15 mg/dL — AB (ref 26–217)
IgA, Qn, Serum: 60 mg/dL — ABNORMAL LOW (ref 64–422)

## 2017-01-28 NOTE — Assessment & Plan Note (Signed)
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

## 2017-01-28 NOTE — Assessment & Plan Note (Signed)
She is taking Arimidex since 07/14/2015 as part of the adjuvant treatment for breast cancer. So far, she tolerated treatment well without any side effects. I will continue to see her on the regular basis including breast examination and will make sure that she get regular screening mammogram Her recent screening mammogram in February 2018 was normal She will continue Arimidex for  5 years.

## 2017-01-28 NOTE — Assessment & Plan Note (Signed)
She had recent iron deficiency anemia likely secondary to chronic GI bleed from anticoagulation treatment She responded well to intravenous iron I recommend continue close surveillance of iron studies in her next visit

## 2017-01-28 NOTE — Assessment & Plan Note (Signed)
I reviewed the CT scan which show no evidence of active disease She has completed chemotherapy treatment I recommend history and physical examination in 3 months and a plan to repeat imaging again in 1 year, due August 2019 In the meantime, I will maintain port patency with port flushes

## 2017-01-28 NOTE — Progress Notes (Signed)
[facility] OFFICE PROGRESS NOTE  Patient Care Team: Deland Pretty, MD as PCP - General (Internal Medicine) Adrian Prows, MD as Consulting Physician (Cardiology) Heath Lark, MD as Consulting Physician (Hematology and Oncology) Fanny Skates, MD as Consulting Physician (General Surgery) Eppie Gibson, MD as Attending Physician (Radiation Oncology) Sylvan Cheese, NP as Nurse Practitioner (Hematology and Oncology) Thompson Grayer, MD as Consulting Physician (Cardiology) Lahoma Rocker, MD as Consulting Physician (Rheumatology) Terrance Mass, MD as Consulting Physician (Gynecology)  SUMMARY OF ONCOLOGIC HISTORY: Oncology History   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      01/03/2014 Bone Marrow Biopsy    Accession: [id number] Bone marrow biopsy is negative      01/04/2014 Imaging    ECHO showed normal EF      01/04/2014 Procedure    She has placement of port      01/09/2014 - 03/07/2014 Chemotherapy    She was given treatment with bendamustine with rituximab. Treatment was stopped due to severe side-effects despite significant dose adjustment for cycle 2      01/18/2014 - 02/01/2014 Hospital Admission    She was admitted to the hospital from Escherichia coli sepsis with multiorgan failure and brief episodes  of intubation. She was discharged to skilled nursing facility      02/26/2014 Imaging    PET/CT scan showed near complete remission      02/27/2014 Adverse Reaction    Cycle 2 of treatment was resumed with drastic dose adjustment to bendamustine due to recent multi-organ failure      04/03/2014 - 03/13/2015 Chemotherapy    She is started on maintenance rituximab only.      04/09/2014 - 04/12/2014 Hospital Admission    The patient was admitted to the hospital with urinary tract infection and sepsis.      06/05/2014 Imaging    PET CT scan showed complete response to Rx      02/12/2015 Imaging    Ct scan showed no evidence of disease. It shows she has new pneumonia      04/02/2015 Miscellaneous    She received 1 dose IVIG complicated by mild infusion reaction      04/26/2015 - 04/30/2015 Hospital Admission    She had recurrent admission to the hospital with sepsis      10/22/2015 Imaging    CT scan of chest and abdomen showed Ill-defined tissue in the porta hepatis suggest treated lymphoma. No evidence of lymphadenopathy in the chest, abdomen & pelvis to suggest recurrent lymphoma.      10/20/2016 Imaging    1. Stable appearance of ill defined soft tissue within the porta hepatis compatible with treated lymphoma. 2. No new findings identified. 3. Aortic Atherosclerosis (  ICD10-I70.0). LAD coronary artery calcifications noted.       Breast cancer of upper-inner quadrant of right female breast ([place])   11/06/2014 Imaging    DEXA scan showed osteopenia T-1.9 on bilateral femoral neck      03/31/2015 Imaging    Screening mammogram showed suspicious lesion, confirmed on diagnostic imaging at 2 and 230 position on the right breast      04/10/2015 Pathology Results    Accession: [id number] breast biopsy in 2 locations came back invasive ductal carcinoma with calcification, 100% ER and PR positive, HER 2 neg      04/10/2015 Clinical Stage    Stage IA: T1 N0      05/27/2015 Surgery     Right total mastectomy: multifocal IDC, 1.5 and 1.0 cm, neg margins, ER+ (100% - both); PR+ (100% and 95%), HER2neu negative (ratio 1.69 and 1.14) Ki67 2% and 10%. DCIS      05/27/2015 Pathologic Stage    Stage IA: mpT1c pNx pMx      06/17/2015 Survivorship    Survivorship care plan mailed to patient at her request      07/14/2015 -  Anti-estrogen oral therapy    She started taking Arimidex      04/01/2016 Imaging    No mammographic evidence of malignancy in the left breast.       INTERVAL HISTORY: Please see below for problem oriented charting. She returns for further follow-up She denies recent recurrent infection She is on chronic prophylactic treatment with doxycycline No recent lymphadenopathy She denies any recent abnormal breast examination, palpable mass, abnormal breast appearance or nipple changes She responded well to intravenous iron with improved energy level  REVIEW OF SYSTEMS:   Constitutional: Denies fevers, chills or abnormal weight loss Eyes: Denies blurriness of vision Ears, nose, mouth, throat, and face: Denies mucositis or sore throat Respiratory: Denies cough, dyspnea or wheezes Cardiovascular: Denies palpitation, chest discomfort or lower extremity swelling Gastrointestinal:  Denies nausea, heartburn or change in bowel habits Skin: Denies abnormal skin rashes Lymphatics: Denies new lymphadenopathy or easy bruising Neurological:Denies numbness, tingling or new weaknesses Behavioral/Psych: Mood is stable, no new changes  All other systems were reviewed with the patient and are negative.  I have reviewed the past medical history, past surgical history, social history and family history with the patient and they are unchanged from previous note.  ALLERGIES:  is allergic to morphine and related; multaq [dronedarone]; demerol; oysters [shellfish allergy]; penicillins; and sulfa drugs cross reactors.  MEDICATIONS:  Current Outpatient Medications  Medication  Sig Dispense Refill  . acetaminophen (TYLENOL) 500 MG tablet Take 1,000 mg by mouth every 6 (six) hours as needed for moderate pain, fever or headache.    . albuterol (PROVENTIL HFA;VENTOLIN HFA) 108 (90 Base) MCG/ACT inhaler Inhale into the lungs as directed.    Marland Kitchen albuterol (PROVENTIL) (2.5 MG/3ML) 0.083% nebulizer solution Inhale 3 mLs into the lungs every 6 (six) hours as needed for wheezing or shortness of breath.     . anastrozole (ARIMIDEX) 1 MG tablet TAKE ONE TABLET BY MOUTH EVERY DAY 90 tablet 9  . Cholecalciferol (VITAMIN D) 2000 UNITS tablet Take 2,000 Units by mouth daily with lunch.    . diclofenac sodium (VOLTAREN) 1 % GEL Apply 1 application topically 4 (four) times daily as needed.    . diltiazem (TIAZAC) 120 MG 24 hr capsule Take 120 mg by mouth daily.    Marland Kitchen doxycycline (VIBRAMYCIN) 100 MG capsule Take 100 mg by mouth daily.    Marland Kitchen  Fluticasone-Salmeterol (ADVAIR) 250-50 MCG/DOSE AEPB Inhale 2 puffs into the lungs every 12 (twelve) hours.     . gabapentin (NEURONTIN) 300 MG capsule Take 300 mg daily by mouth.    . hydrochlorothiazide (HYDRODIURIL) 25 MG tablet Take 37.5 mg by mouth daily as needed (as needed for leg swelling).    Marland Kitchen levothyroxine (SYNTHROID, LEVOTHROID) 200 MCG tablet Take 200 mcg by mouth daily before breakfast.    . lidocaine-prilocaine (EMLA) cream Apply 1 application topically as needed. Apply to [facility] a Cath site at least one hour prior to needle stick as needed. 30 g 10  . Magnesium Oxide 500 MG TABS Take 500 mg by mouth daily.     . metFORMIN (GLUCOPHAGE-XR) 500 MG 24 hr tablet Take 500 mg by mouth 2 (two) times daily with a meal.    . montelukast (SINGULAIR) 10 MG tablet Take 10 mg by mouth daily.    . Omega-3-acid Ethyl Esters (LOVAZA PO) Take 1 capsule by mouth 2 (two) times daily.    . ondansetron (ZOFRAN) 8 MG tablet Take 1 tablet (8 mg total) by mouth every 8 (eight) hours as needed for nausea. 60 tablet 3  . pravastatin (PRAVACHOL) 20 MG tablet 1/2 tab  mon-thurs    . rivaroxaban (XARELTO) 20 MG TABS tablet Take 20 mg by mouth daily with supper.    . traMADol-acetaminophen (ULTRACET) 37.5-325 MG tablet Take 1 tablet by mouth every 6 (six) hours as needed (Pain).    . triamterene-hydrochlorothiazide (MAXZIDE-25) 37.5-25 MG tablet Take 1 tablet by mouth daily.    . vitamin B-12 (CYANOCOBALAMIN) 500 MCG tablet Take 500 mcg by mouth daily.     No current facility-administered medications for this visit.     PHYSICAL EXAMINATION: ECOG PERFORMANCE STATUS: 1 - Symptomatic but completely ambulatory  Vitals:   01/27/17 0848  Pulse: 84  Resp: 18  Temp: 98.2 F (36.8 C)  SpO2: 98%   Filed Weights   01/27/17 0848  Weight: 142 lb (64.4 kg)    GENERAL:alert, no distress and comfortable SKIN: skin color, texture, turgor are normal, no rashes or significant lesions EYES: normal, Conjunctiva are pink and non-injected, sclera clear OROPHARYNX:no exudate, no erythema and lips, buccal mucosa, and tongue normal  NECK: supple, thyroid normal size, non-tender, without nodularity LYMPH:  no palpable lymphadenopathy in the cervical, axillary or inguinal LUNGS: clear to auscultation and percussion with normal breathing effort HEART: regular rate & rhythm and no murmurs and no lower extremity edema ABDOMEN:abdomen soft, non-tender and normal bowel sounds Musculoskeletal:no cyanosis of digits and no clubbing  NEURO: alert & oriented x 3 with fluent speech, no focal motor/sensory deficits  LABORATORY DATA:  I have reviewed the data as listed    Component Value Date/Time   NA 140 01/27/2017 0811   K 3.9 01/27/2017 0811   CL 108 08/20/2015 0430   CO2 25 01/27/2017 0811   GLUCOSE 122 01/27/2017 0811   BUN 16.9 01/27/2017 0811   CREATININE 0.8 01/27/2017 0811   CALCIUM 9.9 01/27/2017 0811   PROT 6.9 01/27/2017 0811   ALBUMIN 4.4 01/27/2017 0811   AST 15 01/27/2017 0811   ALT 15 01/27/2017 0811   ALKPHOS 77 01/27/2017 0811   BILITOT 0.65  01/27/2017 0811   GFRNONAA >60 08/20/2015 0430   GFRAA >60 08/20/2015 0430    No results found for: SPEP, UPEP  Lab Results  Component Value Date   WBC 6.7 01/27/2017   NEUTROABS 4.7 01/27/2017   HGB 11.5 (L) 01/27/2017  HCT 33.4 (L) 01/27/2017   MCV 86.9 01/27/2017   PLT 211 01/27/2017      Chemistry      Component Value Date/Time   NA 140 01/27/2017 0811   K 3.9 01/27/2017 0811   CL 108 08/20/2015 0430   CO2 25 01/27/2017 0811   BUN 16.9 01/27/2017 0811   CREATININE 0.8 01/27/2017 0811      Component Value Date/Time   CALCIUM 9.9 01/27/2017 0811   ALKPHOS 77 01/27/2017 0811   AST 15 01/27/2017 0811   ALT 15 01/27/2017 0811   BILITOT 0.65 01/27/2017 0811      ASSESSMENT & PLAN:  Breast cancer of upper-inner quadrant of right female breast ([place]) She is taking Arimidex since 07/14/2015 as part of the adjuvant treatment for breast cancer. So far, she tolerated treatment well without any side effects. I will continue to see her on the regular basis including breast examination and will make sure that she get regular screening mammogram Her recent screening mammogram in February 2018 was normal She will continue Arimidex for  5 years.  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, face, and neck ([place]) I reviewed the CT scan which show no evidence of active disease She has completed chemotherapy treatment I recommend history and physical examination in 3 months and a plan to repeat imaging again in 1 year, due August 2019 In the meantime, I will maintain port patency with port flushes  Iron deficiency anemia She had recent iron deficiency anemia likely secondary to chronic GI bleed from anticoagulation treatment She responded well to intravenous iron I recommend continue close surveillance of iron studies in her next visit  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 Ferritin    Standing Status:   Future    Standing Expiration Date:   03/04/2018  . Iron and TIBC    Standing Status:   Future    Standing Expiration Date:   03/04/2018   All questions were answered. The patient knows to call the clinic with any problems, questions or concerns. No barriers to learning was detected. I spent 15 minutes counseling the patient face to face. The total time spent in the appointment was 20 minutes and more than 50% was on counseling and review of test results     Heath Lark, MD 01/28/2017 12:51 PM

## 2017-02-16 DIAGNOSIS — R3914 Feeling of incomplete bladder emptying: Secondary | ICD-10-CM | POA: Diagnosis not present

## 2017-02-16 DIAGNOSIS — N312 Flaccid neuropathic bladder, not elsewhere classified: Secondary | ICD-10-CM | POA: Diagnosis not present

## 2017-02-16 DIAGNOSIS — N3020 Other chronic cystitis without hematuria: Secondary | ICD-10-CM | POA: Diagnosis not present

## 2017-03-09 DIAGNOSIS — N312 Flaccid neuropathic bladder, not elsewhere classified: Secondary | ICD-10-CM | POA: Diagnosis not present

## 2017-03-09 DIAGNOSIS — N3020 Other chronic cystitis without hematuria: Secondary | ICD-10-CM | POA: Diagnosis not present

## 2017-03-10 DIAGNOSIS — E119 Type 2 diabetes mellitus without complications: Secondary | ICD-10-CM | POA: Diagnosis not present

## 2017-03-10 DIAGNOSIS — Z961 Presence of intraocular lens: Secondary | ICD-10-CM | POA: Diagnosis not present

## 2017-03-10 DIAGNOSIS — H5201 Hypermetropia, right eye: Secondary | ICD-10-CM | POA: Diagnosis not present

## 2017-03-11 ENCOUNTER — Other Ambulatory Visit: Payer: Self-pay | Admitting: Internal Medicine

## 2017-03-11 DIAGNOSIS — Z9011 Acquired absence of right breast and nipple: Secondary | ICD-10-CM

## 2017-03-11 DIAGNOSIS — Z1231 Encounter for screening mammogram for malignant neoplasm of breast: Secondary | ICD-10-CM

## 2017-03-17 ENCOUNTER — Telehealth: Payer: Self-pay | Admitting: Hematology and Oncology

## 2017-03-17 ENCOUNTER — Inpatient Hospital Stay: Payer: Medicare Other

## 2017-03-17 ENCOUNTER — Telehealth: Payer: Self-pay

## 2017-03-17 ENCOUNTER — Inpatient Hospital Stay: Payer: Medicare Other | Attending: Hematology and Oncology | Admitting: Hematology and Oncology

## 2017-03-17 VITALS — BP 130/69 | HR 80 | Temp 98.2°F | Resp 18 | Ht 65.5 in | Wt 144.2 lb

## 2017-03-17 DIAGNOSIS — C8591 Non-Hodgkin lymphoma, unspecified, lymph nodes of head, face, and neck: Secondary | ICD-10-CM

## 2017-03-17 DIAGNOSIS — D509 Iron deficiency anemia, unspecified: Secondary | ICD-10-CM | POA: Insufficient documentation

## 2017-03-17 DIAGNOSIS — Z170 Estrogen receptor positive status [ER+]: Secondary | ICD-10-CM | POA: Insufficient documentation

## 2017-03-17 DIAGNOSIS — C50211 Malignant neoplasm of upper-inner quadrant of right female breast: Secondary | ICD-10-CM | POA: Insufficient documentation

## 2017-03-17 DIAGNOSIS — D500 Iron deficiency anemia secondary to blood loss (chronic): Secondary | ICD-10-CM

## 2017-03-17 DIAGNOSIS — D801 Nonfamilial hypogammaglobulinemia: Secondary | ICD-10-CM

## 2017-03-17 LAB — CBC WITH DIFFERENTIAL/PLATELET
BASOS PCT: 1 %
Basophils Absolute: 0 10*3/uL (ref 0.0–0.1)
Eosinophils Absolute: 0.2 10*3/uL (ref 0.0–0.5)
Eosinophils Relative: 2 %
HEMATOCRIT: 32.8 % — AB (ref 34.8–46.6)
HEMOGLOBIN: 11.2 g/dL — AB (ref 11.6–15.9)
Lymphocytes Relative: 18 %
Lymphs Abs: 1.4 10*3/uL (ref 0.9–3.3)
MCH: 30.8 pg (ref 25.1–34.0)
MCHC: 34.2 g/dL (ref 31.5–36.0)
MCV: 90.1 fL (ref 79.5–101.0)
MONOS PCT: 8 %
Monocytes Absolute: 0.7 10*3/uL (ref 0.1–0.9)
NEUTROS PCT: 71 %
Neutro Abs: 5.6 10*3/uL (ref 1.5–6.5)
Platelets: 233 10*3/uL (ref 145–400)
RBC: 3.64 MIL/uL — AB (ref 3.70–5.45)
RDW: 14.9 % (ref 11.2–16.1)
WBC: 7.9 10*3/uL (ref 3.9–10.3)

## 2017-03-17 LAB — IRON AND TIBC
Iron: 69 ug/dL (ref 41–142)
Saturation Ratios: 23 % (ref 21–57)
TIBC: 298 ug/dL (ref 236–444)
UIBC: 229 ug/dL

## 2017-03-17 LAB — COMPREHENSIVE METABOLIC PANEL
ALBUMIN: 4.3 g/dL (ref 3.5–5.0)
ALK PHOS: 74 U/L (ref 40–150)
ALT: 14 U/L (ref 0–55)
AST: 14 U/L (ref 5–34)
Anion gap: 10 (ref 3–11)
BILIRUBIN TOTAL: 0.5 mg/dL (ref 0.2–1.2)
BUN: 15 mg/dL (ref 7–26)
CALCIUM: 9.8 mg/dL (ref 8.4–10.4)
CO2: 27 mmol/L (ref 22–29)
Chloride: 103 mmol/L (ref 98–109)
Creatinine, Ser: 0.8 mg/dL (ref 0.60–1.10)
GFR calc Af Amer: >60 mL/min (ref >60)
GFR calc non Af Amer: >60 mL/min (ref >60)
GLUCOSE: 119 mg/dL (ref 70–140)
Potassium: 4.2 mmol/L (ref 3.3–4.7)
Sodium: 140 mmol/L (ref 136–145)
TOTAL PROTEIN: 6.7 g/dL (ref 6.4–8.3)

## 2017-03-17 LAB — FERRITIN: FERRITIN: 183 ng/mL (ref 9–269)

## 2017-03-17 MED ORDER — HEPARIN SOD (PORK) LOCK FLUSH 100 UNIT/ML IV SOLN
500.0000 [IU] | Freq: Once | INTRAVENOUS | Status: AC | PRN
  Administered 2017-03-17: 500 [IU] via INTRAVENOUS
  Filled 2017-03-17: qty 5

## 2017-03-17 MED ORDER — SODIUM CHLORIDE 0.9 % IJ SOLN
10.0000 mL | INTRAMUSCULAR | Status: DC | PRN
  Administered 2017-03-17: 10 mL via INTRAVENOUS
  Filled 2017-03-17: qty 10

## 2017-03-17 NOTE — Telephone Encounter (Signed)
-----   Message from Heath Lark, MD sent at 03/17/2017  2:59 PM EST ----- Regarding: iron studies OK Let her know iron study is normal ----- Message ----- From: Interface, Lab In [facility] Sent: 03/17/2017   1:33 PM To: Heath Lark, MD

## 2017-03-17 NOTE — Telephone Encounter (Signed)
Called with below message, verbalized understanding. 

## 2017-03-17 NOTE — Telephone Encounter (Signed)
Gave patient avs and calendar with appts per 1/17 los.  °

## 2017-03-18 ENCOUNTER — Encounter: Payer: Self-pay | Admitting: Hematology and Oncology

## 2017-03-18 NOTE — Assessment & Plan Note (Signed)
She had recent iron deficiency anemia likely secondary to chronic GI bleed from anticoagulation treatment She responded well to intravenous iron

## 2017-03-18 NOTE — Progress Notes (Signed)
[facility] OFFICE PROGRESS NOTE  Patient Care Team: Deland Pretty, MD as PCP - General (Internal Medicine) Adrian Prows, MD as Consulting Physician (Cardiology) Heath Lark, MD as Consulting Physician (Hematology and Oncology) Fanny Skates, MD as Consulting Physician (General Surgery) Eppie Gibson, MD as Attending Physician (Radiation Oncology) Sylvan Cheese, NP as Nurse Practitioner (Hematology and Oncology) Thompson Grayer, MD as Consulting Physician (Cardiology) Lahoma Rocker, MD as Consulting Physician (Rheumatology) Terrance Mass, MD as Consulting Physician (Gynecology)  SUMMARY OF ONCOLOGIC HISTORY: Oncology History   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      01/03/2014 Bone Marrow Biopsy    Accession: [id number] Bone marrow biopsy is negative      01/04/2014 Imaging    ECHO showed normal EF      01/04/2014 Procedure    She has placement of port      01/09/2014 - 03/07/2014 Chemotherapy    She was given treatment with bendamustine with rituximab. Treatment was stopped due to severe side-effects despite significant dose adjustment for cycle 2      01/18/2014 - 02/01/2014 Hospital Admission    She was admitted to the hospital from Escherichia coli sepsis with multiorgan failure and brief episodes  of intubation. She was discharged to skilled nursing facility      02/26/2014 Imaging    PET/CT scan showed near complete remission      02/27/2014 Adverse Reaction    Cycle 2 of treatment was resumed with drastic dose adjustment to bendamustine due to recent multi-organ failure      04/03/2014 - 03/13/2015 Chemotherapy    She is started on maintenance rituximab only.      04/09/2014 - 04/12/2014 Hospital Admission    The patient was admitted to the hospital with urinary tract infection and sepsis.      06/05/2014 Imaging    PET CT scan showed complete response to Rx      02/12/2015 Imaging    Ct scan showed no evidence of disease. It shows she has new pneumonia      04/02/2015 Miscellaneous    She received 1 dose IVIG complicated by mild infusion reaction      04/26/2015 - 04/30/2015 Hospital Admission    She had recurrent admission to the hospital with sepsis      10/22/2015 Imaging    CT scan of chest and abdomen showed Ill-defined tissue in the porta hepatis suggest treated lymphoma. No evidence of lymphadenopathy in the chest, abdomen & pelvis to suggest recurrent lymphoma.      10/20/2016 Imaging    1. Stable appearance of ill defined soft tissue within the porta hepatis compatible with treated lymphoma. 2. No new findings identified. 3. Aortic Atherosclerosis (  ICD10-I70.0). LAD coronary artery calcifications noted.       Breast cancer of upper-inner quadrant of right female breast ([place])   11/06/2014 Imaging    DEXA scan showed osteopenia T-1.9 on bilateral femoral neck      03/31/2015 Imaging    Screening mammogram showed suspicious lesion, confirmed on diagnostic imaging at 2 and 230 position on the right breast      04/10/2015 Pathology Results    Accession: [id number] breast biopsy in 2 locations came back invasive ductal carcinoma with calcification, 100% ER and PR positive, HER 2 neg      04/10/2015 Clinical Stage    Stage IA: T1 N0      05/27/2015 Surgery     Right total mastectomy: multifocal IDC, 1.5 and 1.0 cm, neg margins, ER+ (100% - both); PR+ (100% and 95%), HER2neu negative (ratio 1.69 and 1.14) Ki67 2% and 10%. DCIS      05/27/2015 Pathologic Stage    Stage IA: mpT1c pNx pMx      06/17/2015 Survivorship    Survivorship care plan mailed to patient at her request      07/14/2015 -  Anti-estrogen oral therapy    She started taking Arimidex      04/01/2016 Imaging    No mammographic evidence of malignancy in the left breast.       INTERVAL HISTORY: Please see below for problem oriented charting. She returns for further follow-up She denies recent infection No new lymphadenopathy Denies recent chest pain, shortness of breath or signs or symptoms of congestive heart failure She denies any recent abnormal breast examination, palpable mass, abnormal breast appearance or nipple changes The patient denies any recent signs or symptoms of bleeding such as spontaneous epistaxis, hematuria or hematochezia.  REVIEW OF SYSTEMS:   Constitutional: Denies fevers, chills or abnormal weight loss Eyes: Denies blurriness of vision Ears, nose, mouth, throat, and face: Denies mucositis or sore throat Respiratory: Denies cough, dyspnea or wheezes Cardiovascular: Denies palpitation, chest discomfort or lower extremity swelling Gastrointestinal:  Denies nausea, heartburn or change in bowel habits Skin: Denies abnormal skin rashes Lymphatics: Denies new lymphadenopathy or easy bruising Neurological:Denies numbness, tingling or new weaknesses Behavioral/Psych: Mood is stable, no new changes  All other systems were reviewed with the patient and are negative.  I have reviewed the past medical history, past surgical history, social history and family history with the patient and they are unchanged from previous note.  ALLERGIES:  is allergic to morphine and related; multaq [dronedarone]; demerol; oysters [shellfish allergy]; penicillins; and sulfa drugs  cross reactors.  MEDICATIONS:  Current Outpatient Medications  Medication Sig Dispense Refill  . acetaminophen (TYLENOL) 500 MG tablet Take 1,000 mg by mouth every 6 (six) hours as needed for moderate pain, fever or headache.    . albuterol (PROVENTIL HFA;VENTOLIN HFA) 108 (90 Base) MCG/ACT inhaler Inhale into the lungs as directed.    Marland Kitchen albuterol (PROVENTIL) (2.5 MG/3ML) 0.083% nebulizer solution Inhale 3 mLs into the lungs every 6 (six) hours as needed for wheezing or shortness of breath.     . anastrozole (ARIMIDEX) 1 MG tablet TAKE ONE TABLET BY MOUTH EVERY DAY 90 tablet 9  . Cholecalciferol (VITAMIN D) 2000 UNITS tablet Take 2,000 Units by mouth daily with lunch.    . diclofenac sodium (VOLTAREN) 1 % GEL Apply 1 application topically 4 (four) times daily as needed.    . diltiazem (TIAZAC) 120 MG 24 hr capsule Take 120 mg by mouth daily.    Marland Kitchen  doxycycline (VIBRAMYCIN) 100 MG capsule Take 100 mg by mouth daily.    . Fluticasone-Salmeterol (ADVAIR) 250-50 MCG/DOSE AEPB Inhale 2 puffs into the lungs every 12 (twelve) hours.     . gabapentin (NEURONTIN) 300 MG capsule Take 300 mg daily by mouth.    . hydrochlorothiazide (HYDRODIURIL) 25 MG tablet Take 37.5 mg by mouth daily as needed (as needed for leg swelling).    Marland Kitchen levothyroxine (SYNTHROID, LEVOTHROID) 200 MCG tablet Take 200 mcg by mouth daily before breakfast.    . lidocaine-prilocaine (EMLA) cream Apply 1 application topically as needed. Apply to [facility] a Cath site at least one hour prior to needle stick as needed. 30 g 10  . Magnesium Oxide 500 MG TABS Take 500 mg by mouth daily.     . metFORMIN (GLUCOPHAGE-XR) 500 MG 24 hr tablet Take 500 mg by mouth 2 (two) times daily with a meal.    . montelukast (SINGULAIR) 10 MG tablet Take 10 mg by mouth daily.    . Omega-3-acid Ethyl Esters (LOVAZA PO) Take 1 capsule by mouth 2 (two) times daily.    . ondansetron (ZOFRAN) 8 MG tablet Take 1 tablet (8 mg total) by mouth every 8 (eight) hours as  needed for nausea. 60 tablet 3  . pravastatin (PRAVACHOL) 20 MG tablet 1/2 tab mon-thurs    . rivaroxaban (XARELTO) 20 MG TABS tablet Take 20 mg by mouth daily with supper.    . traMADol-acetaminophen (ULTRACET) 37.5-325 MG tablet Take 1 tablet by mouth every 6 (six) hours as needed (Pain).    . triamterene-hydrochlorothiazide (MAXZIDE-25) 37.5-25 MG tablet Take 1 tablet by mouth daily.    . vitamin B-12 (CYANOCOBALAMIN) 500 MCG tablet Take 500 mcg by mouth daily.     No current facility-administered medications for this visit.     PHYSICAL EXAMINATION: ECOG PERFORMANCE STATUS: 1 - Symptomatic but completely ambulatory  Vitals:   03/17/17 1343  BP: 130/69  Pulse: 80  Resp: 18  Temp: 98.2 F (36.8 C)  SpO2: 96%   Filed Weights   03/17/17 1343  Weight: 144 lb 3.2 oz (65.4 kg)    GENERAL:alert, no distress and comfortable SKIN: skin color, texture, turgor are normal, no rashes or significant lesions EYES: normal, Conjunctiva are pink and non-injected, sclera clear OROPHARYNX:no exudate, no erythema and lips, buccal mucosa, and tongue normal  NECK: supple, thyroid normal size, non-tender, without nodularity LYMPH:  no palpable lymphadenopathy in the cervical, axillary or inguinal LUNGS: clear to auscultation and percussion with normal breathing effort HEART: regular rate & rhythm and no murmurs and no lower extremity edema ABDOMEN:abdomen soft, non-tender and normal bowel sounds Musculoskeletal:no cyanosis of digits and no clubbing  NEURO: alert & oriented x 3 with fluent speech, no focal motor/sensory deficits Chest wall examination revealed well-healed mastectomy scar.  No other palpable abnormalities.  LABORATORY DATA:  I have reviewed the data as listed    Component Value Date/Time   NA 140 03/17/2017 1228   NA 140 01/27/2017 0811   K 4.2 03/17/2017 1228   K 3.9 01/27/2017 0811   CL 103 03/17/2017 1228   CO2 27 03/17/2017 1228   CO2 25 01/27/2017 0811   GLUCOSE 119  03/17/2017 1228   GLUCOSE 122 01/27/2017 0811   BUN 15 03/17/2017 1228   BUN 16.9 01/27/2017 0811   CREATININE 0.80 03/17/2017 1228   CREATININE 0.8 01/27/2017 0811   CALCIUM 9.8 03/17/2017 1228   CALCIUM 9.9 01/27/2017 0811   PROT 6.7 03/17/2017 1228  PROT 6.9 01/27/2017 0811   ALBUMIN 4.3 03/17/2017 1228   ALBUMIN 4.4 01/27/2017 0811   AST 14 03/17/2017 1228   AST 15 01/27/2017 0811   ALT 14 03/17/2017 1228   ALT 15 01/27/2017 0811   ALKPHOS 74 03/17/2017 1228   ALKPHOS 77 01/27/2017 0811   BILITOT 0.5 03/17/2017 1228   BILITOT 0.65 01/27/2017 0811   GFRNONAA >60 03/17/2017 1228   GFRAA >60 03/17/2017 1228    No results found for: SPEP, UPEP  Lab Results  Component Value Date   WBC 7.9 03/17/2017   NEUTROABS 5.6 03/17/2017   HGB 11.2 (L) 03/17/2017   HCT 32.8 (L) 03/17/2017   MCV 90.1 03/17/2017   PLT 233 03/17/2017      Chemistry      Component Value Date/Time   NA 140 03/17/2017 1228   NA 140 01/27/2017 0811   K 4.2 03/17/2017 1228   K 3.9 01/27/2017 0811   CL 103 03/17/2017 1228   CO2 27 03/17/2017 1228   CO2 25 01/27/2017 0811   BUN 15 03/17/2017 1228   BUN 16.9 01/27/2017 0811   CREATININE 0.80 03/17/2017 1228   CREATININE 0.8 01/27/2017 0811      Component Value Date/Time   CALCIUM 9.8 03/17/2017 1228   CALCIUM 9.9 01/27/2017 0811   ALKPHOS 74 03/17/2017 1228   ALKPHOS 77 01/27/2017 0811   AST 14 03/17/2017 1228   AST 15 01/27/2017 0811   ALT 14 03/17/2017 1228   ALT 15 01/27/2017 0811   BILITOT 0.5 03/17/2017 1228   BILITOT 0.65 01/27/2017 0811     ASSESSMENT & PLAN:  Breast cancer of upper-inner quadrant of right female breast ([place]) She is taking Arimidex since 07/14/2015 as part of the adjuvant treatment for breast cancer. So far, she tolerated treatment well without any side effects. I will continue to see her on the regular basis including breast examination and will make sure that she get regular screening mammogram Her recent  screening mammogram in February 2018 was normal, due next month She will continue Arimidex for  5 years.  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 IR Removal Tun Access W/ Port W/O FL    Standing Status:   Future    Standing Expiration Date:   05/16/2018    Order Specific Question:   Reason for exam:    Answer:   port removal    Order Specific Question:   Preferred Imaging Location?    Answer:   [facility]   All questions were answered. The patient knows to call the clinic with any problems, questions or concerns. No barriers to learning was detected. I spent 15 minutes counseling the patient face to face. The total time spent in the appointment was 20 minutes and more than 50% was on counseling and review of test results     Heath Lark, MD 03/18/2017 8:42 AM

## 2017-03-18 NOTE — Assessment & Plan Note (Signed)
She is taking Arimidex since 07/14/2015 as part of the adjuvant treatment for breast cancer. So far, she tolerated treatment well without any side effects. I will continue to see her on the regular basis including breast examination and will make sure that she get regular screening mammogram Her recent screening mammogram in February 2018 was normal, due next month She will continue Arimidex for  5 years.

## 2017-03-18 NOTE — Assessment & Plan Note (Signed)
Her last CT scan which show no evidence of active disease She has completed chemotherapy treatment, no signs of recurrent for 2 years I recommend port removal

## 2017-03-31 ENCOUNTER — Other Ambulatory Visit: Payer: Self-pay | Admitting: Radiology

## 2017-04-01 ENCOUNTER — Ambulatory Visit (HOSPITAL_COMMUNITY)
Admission: RE | Admit: 2017-04-01 | Discharge: 2017-04-01 | Disposition: A | Discharge status: Home / Self Care | Payer: Medicare Other | Source: Ambulatory Visit | Attending: Hematology and Oncology | Admitting: Hematology and Oncology

## 2017-04-01 ENCOUNTER — Encounter (HOSPITAL_COMMUNITY): Payer: Self-pay | Admitting: Interventional Radiology

## 2017-04-01 DIAGNOSIS — E039 Hypothyroidism, unspecified: Secondary | ICD-10-CM | POA: Insufficient documentation

## 2017-04-01 DIAGNOSIS — Z8572 Personal history of non-Hodgkin lymphomas: Secondary | ICD-10-CM | POA: Diagnosis not present

## 2017-04-01 DIAGNOSIS — G4730 Sleep apnea, unspecified: Secondary | ICD-10-CM | POA: Diagnosis not present

## 2017-04-01 DIAGNOSIS — Z7901 Long term (current) use of anticoagulants: Secondary | ICD-10-CM | POA: Diagnosis not present

## 2017-04-01 DIAGNOSIS — Z7984 Long term (current) use of oral hypoglycemic drugs: Secondary | ICD-10-CM | POA: Diagnosis not present

## 2017-04-01 DIAGNOSIS — Z7951 Long term (current) use of inhaled steroids: Secondary | ICD-10-CM | POA: Diagnosis not present

## 2017-04-01 DIAGNOSIS — I481 Persistent atrial fibrillation: Secondary | ICD-10-CM | POA: Insufficient documentation

## 2017-04-01 DIAGNOSIS — Z882 Allergy status to sulfonamides status: Secondary | ICD-10-CM | POA: Diagnosis not present

## 2017-04-01 DIAGNOSIS — E1122 Type 2 diabetes mellitus with diabetic chronic kidney disease: Secondary | ICD-10-CM | POA: Insufficient documentation

## 2017-04-01 DIAGNOSIS — I2510 Atherosclerotic heart disease of native coronary artery without angina pectoris: Secondary | ICD-10-CM | POA: Insufficient documentation

## 2017-04-01 DIAGNOSIS — I129 Hypertensive chronic kidney disease with stage 1 through stage 4 chronic kidney disease, or unspecified chronic kidney disease: Secondary | ICD-10-CM | POA: Diagnosis not present

## 2017-04-01 DIAGNOSIS — N189 Chronic kidney disease, unspecified: Secondary | ICD-10-CM | POA: Insufficient documentation

## 2017-04-01 DIAGNOSIS — Z885 Allergy status to narcotic agent status: Secondary | ICD-10-CM | POA: Insufficient documentation

## 2017-04-01 DIAGNOSIS — J45909 Unspecified asthma, uncomplicated: Secondary | ICD-10-CM | POA: Insufficient documentation

## 2017-04-01 DIAGNOSIS — C8591 Non-Hodgkin lymphoma, unspecified, lymph nodes of head, face, and neck: Secondary | ICD-10-CM

## 2017-04-01 DIAGNOSIS — Z880 Allergy status to penicillin: Secondary | ICD-10-CM | POA: Insufficient documentation

## 2017-04-01 DIAGNOSIS — Z452 Encounter for adjustment and management of vascular access device: Secondary | ICD-10-CM | POA: Insufficient documentation

## 2017-04-01 DIAGNOSIS — Z853 Personal history of malignant neoplasm of breast: Secondary | ICD-10-CM | POA: Insufficient documentation

## 2017-04-01 HISTORY — PX: IR REMOVAL TUN ACCESS W/ PORT W/O FL MOD SED: IMG2290

## 2017-04-01 LAB — CBC
HCT: 32.6 % — ABNORMAL LOW (ref 36.0–46.0)
Hemoglobin: 11.2 g/dL — ABNORMAL LOW (ref 12.0–15.0)
MCH: 30.9 pg (ref 26.0–34.0)
MCHC: 34.4 g/dL (ref 30.0–36.0)
MCV: 89.8 fL (ref 78.0–100.0)
PLATELETS: 200 10*3/uL (ref 150–400)
RBC: 3.63 MIL/uL — AB (ref 3.87–5.11)
RDW: 13.9 % (ref 11.5–15.5)
WBC: 7.5 10*3/uL (ref 4.0–10.5)

## 2017-04-01 LAB — GLUCOSE, CAPILLARY: GLUCOSE-CAPILLARY: 129 mg/dL — AB (ref 65–99)

## 2017-04-01 LAB — BASIC METABOLIC PANEL
Anion gap: 10 (ref 5–15)
BUN: 15 mg/dL (ref 6–20)
CHLORIDE: 105 mmol/L (ref 101–111)
CO2: 25 mmol/L (ref 22–32)
Calcium: 9.7 mg/dL (ref 8.9–10.3)
Creatinine, Ser: 0.6 mg/dL (ref 0.44–1.00)
GFR calc non Af Amer: >60 mL/min (ref >60)
Glucose, Bld: 137 mg/dL — ABNORMAL HIGH (ref 65–99)
POTASSIUM: 4 mmol/L (ref 3.5–5.1)
SODIUM: 140 mmol/L (ref 135–145)

## 2017-04-01 LAB — PROTIME-INR
INR: 1
PROTHROMBIN TIME: 13.2 s (ref 11.4–15.2)

## 2017-04-01 MED ORDER — SODIUM CHLORIDE 0.9 % IV SOLN
INTRAVENOUS | Status: DC
  Administered 2017-04-01: 10:00:00 via INTRAVENOUS

## 2017-04-01 MED ORDER — LIDOCAINE HCL 1 % IJ SOLN
INTRAMUSCULAR | Status: AC
  Filled 2017-04-01: qty 20

## 2017-04-01 MED ORDER — NALOXONE HCL 0.4 MG/ML IJ SOLN
INTRAMUSCULAR | Status: AC
  Filled 2017-04-01: qty 1

## 2017-04-01 MED ORDER — LIDOCAINE HCL 1 % IJ SOLN
INTRAMUSCULAR | Status: AC | PRN
  Administered 2017-04-01: 10 mL

## 2017-04-01 MED ORDER — FLUMAZENIL 0.5 MG/5ML IV SOLN
INTRAVENOUS | Status: AC
  Filled 2017-04-01: qty 5

## 2017-04-01 MED ORDER — MIDAZOLAM HCL 2 MG/2ML IJ SOLN
INTRAMUSCULAR | Status: AC
  Filled 2017-04-01: qty 2

## 2017-04-01 MED ORDER — FENTANYL CITRATE (PF) 100 MCG/2ML IJ SOLN
INTRAMUSCULAR | Status: AC | PRN
  Administered 2017-04-01 (×2): 25 ug via INTRAVENOUS

## 2017-04-01 MED ORDER — MIDAZOLAM HCL 2 MG/2ML IJ SOLN
INTRAMUSCULAR | Status: AC | PRN
  Administered 2017-04-01 (×2): 0.5 mg via INTRAVENOUS

## 2017-04-01 MED ORDER — VANCOMYCIN HCL IN DEXTROSE 1-5 GM/200ML-% IV SOLN
INTRAVENOUS | Status: AC
  Filled 2017-04-01: qty 200

## 2017-04-01 MED ORDER — FENTANYL CITRATE (PF) 100 MCG/2ML IJ SOLN
INTRAMUSCULAR | Status: AC
  Filled 2017-04-01: qty 2

## 2017-04-01 MED ORDER — VANCOMYCIN HCL IN DEXTROSE 1-5 GM/200ML-% IV SOLN
1000.0000 mg | INTRAVENOUS | Status: AC
  Administered 2017-04-01: 1000 mg via INTRAVENOUS

## 2017-04-01 MED ORDER — ONDANSETRON HCL 4 MG/2ML IJ SOLN
INTRAMUSCULAR | Status: AC
  Filled 2017-04-01: qty 2

## 2017-04-01 NOTE — Progress Notes (Signed)
Referring Physician(s): Heath Lark  Supervising Physician: Corrie Mckusick  Patient Status:  [facility] OP  Chief Complaint:  "I'm getting my port out"  Subjective: Patient familiar to IR service from prior Port-A-Cath placement on 01/04/14.  She has a prior history of right breast cancer, stage IA as well as follicular lymphoma, stage III who is status post treatment.  Recent CT reveals no evidence of active disease and she presents today for Port-A-Cath removal.  She currently denies fever, headache, chest pain, dyspnea, cough, abdominal/back pain, nausea, vomiting or bleeding. Past Medical History:  Diagnosis Date  . Arthritis   . Asthma   . Breast cancer, right ([place]) 2/17   R sided, s/p total mastectomy  . Chronic kidney disease    recurring UTI  . Complication of anesthesia    "morphine made me stop breathing"  . HCAP (healthcare-associated pneumonia) 12/2013   Archie Endo 01/18/2014  . History of skin cancer   . History of transfusion   . Hypertension   . Hypothyroidism   . Left atrial enlargement   . Lymphoma, non-Hodgkin's ([place])    in remission  . Nocturia   . Persistent atrial fibrillation ([place])   . Shortness of breath dyspnea    "at times"  . Skin cancer   . Sleep apnea    uses c-pap  . Type II diabetes mellitus ([place])    Past Surgical History:  Procedure Laterality Date  . ABDOMINAL HYSTERECTOMY  1972   partial  . APPENDECTOMY  1953  . ATRIAL FIBRILLATION ABLATION  08/19/2015   PVI with CTI ablation by Dr Rayann Heman  . BACK SURGERY     x2  . BREAST SURGERY     Right mastectomy  . CARDIAC CATHETERIZATION  10/10/2008   Nonobstructive CAD  . CARDIOVERSION  04/13/2011   Procedure: CARDIOVERSION;  Surgeon: Laverda Page, MD;  Location: [facility];  Service: Cardiovascular;  Laterality: N/A;  . CARDIOVERSION N/A 06/18/2015   Procedure: CARDIOVERSION;  Surgeon: Adrian Prows, MD;  Location: [facility] ENDOSCOPY;  Service: Cardiovascular;  Laterality: N/A;  . CARDIOVERSION N/A 10/26/2016    Procedure: CARDIOVERSION;  Surgeon: Adrian Prows, MD;  Location: [facility];  Service: Cardiovascular;  Laterality: N/A;  . CATARACT EXTRACTION W/ INTRAOCULAR LENS  IMPLANT, BILATERAL Bilateral   . CHOLECYSTECTOMY  1993  . ELECTROPHYSIOLOGIC STUDY N/A 08/19/2015   Procedure: Atrial Fibrillation Ablation;  Surgeon: Thompson Grayer, MD;  Location: [facility] CV LAB;  Service: Cardiovascular;  Laterality: N/A;  . FOOT SURGERY    . LUMBAR LAMINECTOMY    . MASS BIOPSY Left 12/07/2013   Procedure: EXCISIONAL BIOPSY LEFT NECK MASS ;  Surgeon: Jerrell Belfast, MD;  Location: [facility];  Service: ENT;  Laterality: Left;  Marland Kitchen MASTECTOMY    . PORTACATH PLACEMENT  12/2013   Archie Endo 01/04/2014  . PUBOVAGINAL SLING    . SKIN TAG REMOVAL  2012   leg  . TEE WITHOUT CARDIOVERSION N/A 08/19/2015   Procedure: TRANSESOPHAGEAL ECHOCARDIOGRAM (TEE);  Surgeon: Larey Dresser, MD;  Location: [facility];  Service: Cardiovascular;  Laterality: N/A;  . TONSILLECTOMY    . TOTAL MASTECTOMY Right 05/27/2015   Procedure: RIGHT TOTAL MASTECTOMY;  Surgeon: Fanny Skates, MD;  Location: [facility];  Service: General;  Laterality: Right;      Allergies: Morphine and related; Multaq [dronedarone]; Demerol; Oysters [shellfish allergy]; Penicillins; and Sulfa drugs cross reactors  Medications: Prior to Admission medications   Medication Sig Start Date End Date Taking? Authorizing Provider  acetaminophen (TYLENOL) 500  MG tablet Take 1,000 mg by mouth every 6 (six) hours as needed for moderate pain, fever or headache.   Yes [provider]  albuterol (PROVENTIL HFA;VENTOLIN HFA) 108 (90 Base) MCG/ACT inhaler Inhale into the lungs as directed.   Yes [provider]  anastrozole (ARIMIDEX) 1 MG tablet TAKE ONE TABLET BY MOUTH EVERY DAY 07/14/16  Yes Heath Lark, MD  Cholecalciferol (VITAMIN D) 2000 UNITS tablet Take 2,000 Units by mouth daily with lunch.   Yes [provider]  diltiazem (TIAZAC) 120 MG 24 hr  capsule Take 120 mg by mouth daily. 10/11/16  Yes [provider]  doxycycline (VIBRAMYCIN) 100 MG capsule Take 100 mg by mouth daily. 10/06/16  Yes [provider]  Fluticasone-Salmeterol (ADVAIR) 250-50 MCG/DOSE AEPB Inhale 2 puffs into the lungs every 12 (twelve) hours.    Yes [provider]  gabapentin (NEURONTIN) 300 MG capsule Take 300 mg daily by mouth.   Yes [provider]  levothyroxine (SYNTHROID, LEVOTHROID) 200 MCG tablet Take 200 mcg by mouth daily before breakfast.   Yes [provider]  Magnesium Oxide 500 MG TABS Take 500 mg by mouth daily.    Yes [provider]  metFORMIN (GLUCOPHAGE-XR) 500 MG 24 hr tablet Take 500 mg by mouth 2 (two) times daily with a meal.   Yes [provider]  montelukast (SINGULAIR) 10 MG tablet Take 10 mg by mouth daily.   Yes [provider]  Omega-3-acid Ethyl Esters (LOVAZA PO) Take 1 capsule by mouth 2 (two) times daily.   Yes [provider]  ondansetron (ZOFRAN) 8 MG tablet Take 1 tablet (8 mg total) by mouth every 8 (eight) hours as needed for nausea. 01/27/17  Yes Heath Lark, MD  pravastatin (PRAVACHOL) 20 MG tablet 1/2 tab mon-thurs   Yes [provider]  vitamin B-12 (CYANOCOBALAMIN) 500 MCG tablet Take 500 mcg by mouth daily.   Yes [provider]  albuterol (PROVENTIL) (2.5 MG/3ML) 0.083% nebulizer solution Inhale 3 mLs into the lungs every 6 (six) hours as needed for wheezing or shortness of breath.  02/27/15   [provider]  diclofenac sodium (VOLTAREN) 1 % GEL Apply 1 application topically 4 (four) times daily as needed. 12/11/15   [provider]  hydrochlorothiazide (HYDRODIURIL) 25 MG tablet Take 37.5 mg by mouth daily as needed (as needed for leg swelling).    [provider]  lidocaine-prilocaine (EMLA) cream Apply 1 application topically as needed. Apply to [facility] a Cath site at least one hour prior to needle stick  as needed. 06/18/15   Heath Lark, MD  rivaroxaban (XARELTO) 20 MG TABS tablet Take 20 mg by mouth daily with supper.    [provider]  traMADol-acetaminophen (ULTRACET) 37.5-325 MG tablet Take 1 tablet by mouth every 6 (six) hours as needed (Pain).    [provider]  triamterene-hydrochlorothiazide (MAXZIDE-25) 37.5-25 MG tablet Take 1 tablet by mouth daily. 07/29/16   [provider]     Vital Signs: BP (!) 165/90   Pulse 85   Temp 98 F (36.7 C) (Oral)   Resp 16   SpO2 97%   Physical Exam awake, alert.  Chest with minimal bilateral expiratory wheezing; heart with regular rate and rhythm.  Abdomen soft, positive bowel sounds, nontender.  No lower extremity edema.  Clean, intact right chest wall Port-A-Cath. Imaging: No results found.  Labs:  CBC: Recent Labs    12/02/16 1307 01/27/17 0811 03/17/17 1228 04/01/17 0945  WBC 7.3 6.7 7.9 7.5  HGB 10.1* 11.5* 11.2* 11.2*  HCT 30.9* 33.4* 32.8* 32.6*  PLT 188 211 233 200    COAGS: No results for input(s): INR, APTT in the last 8760 hours.  BMP: Recent Labs    10/20/16 0922 12/02/16 1307 01/27/17 0811 03/17/17 1228  NA 138 140 140 140  K 3.9 3.7 3.9 4.2  CL  --   --   --  103  CO2 25 24 25 27   GLUCOSE 122 126 122 119  BUN 12.2 16.8 16.9 15  CALCIUM 10.2 10.0 9.9 9.8  CREATININE 0.8 0.8 0.8 0.80  GFRNONAA  --   --   --  >60  GFRAA  --   --   --  >60    LIVER FUNCTION TESTS: Recent Labs    10/20/16 0922 12/02/16 1307 01/27/17 0811 03/17/17 1228  BILITOT 0.64 0.37 0.65 0.5  AST 15 14 15 14   ALT 13 13 15 14   ALKPHOS 75 70 77 74  PROT 7.2 6.7 6.9 6.7  ALBUMIN 4.0 4.1 4.4 4.3    Assessment and Plan: Pt with prior history of right breast cancer, stage IA as well as follicular lymphoma, stage III who is status post treatment.  Recent CT reveals no evidence of active disease and she presents today for Port-A-Cath removal.  Details/risks of procedure, including but not limited to,  internal bleeding, infection, injury to adjacent structures, discussed with patient and husband with their understanding and consent.   Electronically Signed: D. Rowe Robert, PA-C 04/01/2017, 10:19 AM   I spent a total of 20 minutes at the the patient's bedside AND on the patient's hospital floor or unit, greater than 50% of which was counseling/coordinating care for port a cath placement    Patient ID: Patty [name], female   DOB: August 15, 1940, 77 y.o.   MRN: [id number]

## 2017-04-01 NOTE — Sedation Documentation (Signed)
Patient denies pain and is resting comfortably.  

## 2017-04-01 NOTE — Discharge Instructions (Signed)
You may remove dressing in 24 hours and bathe.    Implanted Port Removal, Care After Refer to this sheet in the next few weeks. These instructions provide you with information about caring for yourself after your procedure. Your health care provider may also give you more specific instructions. Your treatment has been planned according to current medical practices, but problems sometimes occur. Call your health care provider if you have any problems or questions after your procedure. What can I expect after the procedure? After the procedure, it is common to have:  Soreness or pain near your incision.  Some swelling or bruising near your incision.  Follow these instructions at home: Medicines  Take over-the-counter and prescription medicines only as told by your health care provider.  If you were prescribed an antibiotic medicine, take it as told by your health care provider. Do not stop taking the antibiotic even if you start to feel better. Bathing  Do not take baths, swim, or use a hot tub until your health care provider approves. Ask your health care provider if you can take showers. You may only be allowed to take sponge baths for bathing. Incision care  Follow instructions from your health care provider about how to take care of your incision. Make sure you: ? Wash your hands with soap and water before you change your bandage (dressing). If soap and water are not available, use hand sanitizer. ? Change your dressing as told by your health care provider. ? Keep your dressing dry. ? Leave stitches (sutures), skin glue, or adhesive strips in place. These skin closures may need to stay in place for 2 weeks or longer. If adhesive strip edges start to loosen and curl up, you may trim the loose edges. Do not remove adhesive strips completely unless your health care provider tells you to do that.  Check your incision area every day for signs of infection. Check for: ? More redness,  swelling, or pain. ? More fluid or blood. ? Warmth. ? Pus or a bad smell. Driving  If you received a sedative, do not drive for 24 hours after the procedure.  If you did not receive a sedative, ask your health care provider when it is safe to drive. Activity  Return to your normal activities as told by your health care provider. Ask your health care provider what activities are safe for you.  Until your health care provider says it is safe: ? Do not lift anything that is heavier than 10 lb (4.5 kg). ? Do not do activities that involve lifting your arms over your head. General instructions  Do not use any tobacco products, such as cigarettes, chewing tobacco, and e-cigarettes. Tobacco can delay healing. If you need help quitting, ask your health care provider.  Keep all follow-up visits as told by your health care provider. This is important. Contact a health care provider if:  You have more redness, swelling, or pain around your incision.  You have more fluid or blood coming from your incision.  Your incision feels warm to the touch.  You have pus or a bad smell coming from your incision.  You have a fever.  You have pain that is not relieved by your pain medicine. Get help right away if:  You have chest pain.  You have difficulty breathing. This information is not intended to replace advice given to you by your health care provider. Make sure you discuss any questions you have with your health care provider.  Document Released: 01/27/2015 Document Revised: 07/24/2015 Document Reviewed: 11/20/2014 Elsevier Interactive Patient Education  2018 [place]. Moderate Conscious Sedation, Adult, Care After These instructions provide you with information about caring for yourself after your procedure. Your health care provider may also give you more specific instructions. Your treatment has been planned according to current medical practices, but problems sometimes occur. Call  your health care provider if you have any problems or questions after your procedure. What can I expect after the procedure? After your procedure, it is common:  To feel sleepy for several hours.  To feel clumsy and have poor balance for several hours.  To have poor judgment for several hours.  To vomit if you eat too soon.  Follow these instructions at home: For at least 24 hours after the procedure:   Do not: ? Participate in activities where you could fall or become injured. ? Drive. ? Use heavy machinery. ? Drink alcohol. ? Take sleeping pills or medicines that cause drowsiness. ? Make important decisions or sign legal documents. ? Take care of children on your own.  Rest. Eating and drinking  Follow the diet recommended by your health care provider.  If you vomit: ? Drink water, juice, or soup when you can drink without vomiting. ? Make sure you have little or no nausea before eating solid foods. General instructions  Have a responsible adult stay with you until you are awake and alert.  Take over-the-counter and prescription medicines only as told by your health care provider.  If you smoke, do not smoke without supervision.  Keep all follow-up visits as told by your health care provider. This is important. Contact a health care provider if:  You keep feeling nauseous or you keep vomiting.  You feel light-headed.  You develop a rash.  You have a fever. Get help right away if:  You have trouble breathing. This information is not intended to replace advice given to you by your health care provider. Make sure you discuss any questions you have with your health care provider. Document Released: 12/06/2012 Document Revised: 07/21/2015 Document Reviewed: 06/07/2015 Elsevier Interactive Patient Education  [employer].

## 2017-04-01 NOTE — Procedures (Signed)
Interventional Radiology Procedure Note ° °Procedure: Removal of a right IJ approach single lumen PowerPort.    °Complications: None °Recommendations:  °- Ok to shower tomorrow °- Do not submerge for 7 days ° ° °Signed, ° °Tomeko Scoville S. Jules Vidovich, DO ° ° °

## 2017-04-06 ENCOUNTER — Ambulatory Visit
Admission: RE | Admit: 2017-04-06 | Discharge: 2017-04-06 | Disposition: A | Discharge status: Home / Self Care | Payer: Medicare Other | Source: Ambulatory Visit | Attending: Internal Medicine | Admitting: Internal Medicine

## 2017-04-06 DIAGNOSIS — Z9011 Acquired absence of right breast and nipple: Secondary | ICD-10-CM

## 2017-04-06 DIAGNOSIS — Z1231 Encounter for screening mammogram for malignant neoplasm of breast: Secondary | ICD-10-CM | POA: Diagnosis not present

## 2017-04-07 DIAGNOSIS — I480 Paroxysmal atrial fibrillation: Secondary | ICD-10-CM | POA: Diagnosis not present

## 2017-04-07 DIAGNOSIS — I10 Essential (primary) hypertension: Secondary | ICD-10-CM | POA: Diagnosis not present

## 2017-04-14 DIAGNOSIS — E1142 Type 2 diabetes mellitus with diabetic polyneuropathy: Secondary | ICD-10-CM | POA: Diagnosis not present

## 2017-04-14 DIAGNOSIS — R29898 Other symptoms and signs involving the musculoskeletal system: Secondary | ICD-10-CM | POA: Diagnosis not present

## 2017-05-13 DIAGNOSIS — R1310 Dysphagia, unspecified: Secondary | ICD-10-CM | POA: Diagnosis not present

## 2017-05-13 DIAGNOSIS — Z9989 Dependence on other enabling machines and devices: Secondary | ICD-10-CM | POA: Diagnosis not present

## 2017-05-13 DIAGNOSIS — G4733 Obstructive sleep apnea (adult) (pediatric): Secondary | ICD-10-CM | POA: Diagnosis not present

## 2017-05-13 DIAGNOSIS — G629 Polyneuropathy, unspecified: Secondary | ICD-10-CM | POA: Diagnosis not present

## 2017-05-13 DIAGNOSIS — D509 Iron deficiency anemia, unspecified: Secondary | ICD-10-CM | POA: Diagnosis not present

## 2017-05-27 DIAGNOSIS — I10 Essential (primary) hypertension: Secondary | ICD-10-CM | POA: Diagnosis not present

## 2017-05-27 DIAGNOSIS — M810 Age-related osteoporosis without current pathological fracture: Secondary | ICD-10-CM | POA: Diagnosis not present

## 2017-05-27 DIAGNOSIS — E559 Vitamin D deficiency, unspecified: Secondary | ICD-10-CM | POA: Diagnosis not present

## 2017-06-24 DIAGNOSIS — Z9989 Dependence on other enabling machines and devices: Secondary | ICD-10-CM | POA: Diagnosis not present

## 2017-06-24 DIAGNOSIS — G629 Polyneuropathy, unspecified: Secondary | ICD-10-CM | POA: Diagnosis not present

## 2017-06-24 DIAGNOSIS — G4733 Obstructive sleep apnea (adult) (pediatric): Secondary | ICD-10-CM | POA: Diagnosis not present

## 2017-07-29 DIAGNOSIS — M810 Age-related osteoporosis without current pathological fracture: Secondary | ICD-10-CM | POA: Diagnosis not present

## 2017-08-03 ENCOUNTER — Other Ambulatory Visit: Payer: Self-pay | Admitting: Hematology and Oncology

## 2017-08-05 ENCOUNTER — Ambulatory Visit
Admission: RE | Admit: 2017-08-05 | Discharge: 2017-08-05 | Disposition: A | Discharge status: Home / Self Care | Payer: Medicare Other | Source: Ambulatory Visit | Attending: Physician Assistant | Admitting: Physician Assistant

## 2017-08-05 ENCOUNTER — Other Ambulatory Visit: Payer: Self-pay | Admitting: Physician Assistant

## 2017-08-05 DIAGNOSIS — G6289 Other specified polyneuropathies: Secondary | ICD-10-CM

## 2017-08-05 DIAGNOSIS — M545 Low back pain: Secondary | ICD-10-CM | POA: Diagnosis not present

## 2017-08-15 DIAGNOSIS — E1101 Type 2 diabetes mellitus with hyperosmolarity with coma: Secondary | ICD-10-CM | POA: Diagnosis not present

## 2017-08-15 DIAGNOSIS — G629 Polyneuropathy, unspecified: Secondary | ICD-10-CM | POA: Diagnosis not present

## 2017-08-17 DIAGNOSIS — N3020 Other chronic cystitis without hematuria: Secondary | ICD-10-CM | POA: Diagnosis not present

## 2017-08-17 DIAGNOSIS — N312 Flaccid neuropathic bladder, not elsewhere classified: Secondary | ICD-10-CM | POA: Diagnosis not present

## 2017-08-17 DIAGNOSIS — R351 Nocturia: Secondary | ICD-10-CM | POA: Diagnosis not present

## 2017-08-22 DIAGNOSIS — G629 Polyneuropathy, unspecified: Secondary | ICD-10-CM | POA: Diagnosis not present

## 2017-08-22 DIAGNOSIS — E1101 Type 2 diabetes mellitus with hyperosmolarity with coma: Secondary | ICD-10-CM | POA: Diagnosis not present

## 2017-08-24 DIAGNOSIS — E1101 Type 2 diabetes mellitus with hyperosmolarity with coma: Secondary | ICD-10-CM | POA: Diagnosis not present

## 2017-08-24 DIAGNOSIS — G629 Polyneuropathy, unspecified: Secondary | ICD-10-CM | POA: Diagnosis not present

## 2017-08-25 DIAGNOSIS — G629 Polyneuropathy, unspecified: Secondary | ICD-10-CM | POA: Diagnosis not present

## 2017-08-25 DIAGNOSIS — E1101 Type 2 diabetes mellitus with hyperosmolarity with coma: Secondary | ICD-10-CM | POA: Diagnosis not present

## 2017-08-29 DIAGNOSIS — E1101 Type 2 diabetes mellitus with hyperosmolarity with coma: Secondary | ICD-10-CM | POA: Diagnosis not present

## 2017-08-29 DIAGNOSIS — G629 Polyneuropathy, unspecified: Secondary | ICD-10-CM | POA: Diagnosis not present

## 2017-09-05 DIAGNOSIS — E1101 Type 2 diabetes mellitus with hyperosmolarity with coma: Secondary | ICD-10-CM | POA: Diagnosis not present

## 2017-09-05 DIAGNOSIS — G629 Polyneuropathy, unspecified: Secondary | ICD-10-CM | POA: Diagnosis not present

## 2017-09-07 DIAGNOSIS — E1101 Type 2 diabetes mellitus with hyperosmolarity with coma: Secondary | ICD-10-CM | POA: Diagnosis not present

## 2017-09-07 DIAGNOSIS — G629 Polyneuropathy, unspecified: Secondary | ICD-10-CM | POA: Diagnosis not present

## 2017-09-12 DIAGNOSIS — G629 Polyneuropathy, unspecified: Secondary | ICD-10-CM | POA: Diagnosis not present

## 2017-09-12 DIAGNOSIS — E1101 Type 2 diabetes mellitus with hyperosmolarity with coma: Secondary | ICD-10-CM | POA: Diagnosis not present

## 2017-09-15 ENCOUNTER — Inpatient Hospital Stay: Payer: Medicare Other

## 2017-09-15 ENCOUNTER — Inpatient Hospital Stay: Payer: Medicare Other | Attending: Hematology and Oncology | Admitting: Hematology and Oncology

## 2017-09-15 ENCOUNTER — Telehealth: Payer: Self-pay | Admitting: Hematology and Oncology

## 2017-09-15 DIAGNOSIS — N390 Urinary tract infection, site not specified: Secondary | ICD-10-CM | POA: Diagnosis not present

## 2017-09-15 DIAGNOSIS — G629 Polyneuropathy, unspecified: Secondary | ICD-10-CM | POA: Diagnosis not present

## 2017-09-15 DIAGNOSIS — E1101 Type 2 diabetes mellitus with hyperosmolarity with coma: Secondary | ICD-10-CM | POA: Diagnosis not present

## 2017-09-15 DIAGNOSIS — C50211 Malignant neoplasm of upper-inner quadrant of right female breast: Secondary | ICD-10-CM

## 2017-09-15 DIAGNOSIS — C8591 Non-Hodgkin lymphoma, unspecified, lymph nodes of head, face, and neck: Secondary | ICD-10-CM | POA: Diagnosis not present

## 2017-09-15 DIAGNOSIS — D638 Anemia in other chronic diseases classified elsewhere: Secondary | ICD-10-CM | POA: Diagnosis not present

## 2017-09-15 DIAGNOSIS — Z79811 Long term (current) use of aromatase inhibitors: Secondary | ICD-10-CM | POA: Insufficient documentation

## 2017-09-15 DIAGNOSIS — Z170 Estrogen receptor positive status [ER+]: Secondary | ICD-10-CM | POA: Diagnosis not present

## 2017-09-15 LAB — COMPREHENSIVE METABOLIC PANEL
ALK PHOS: 96 U/L (ref 38–126)
ALT: 19 U/L (ref 0–44)
ANION GAP: 10 (ref 5–15)
AST: 15 U/L (ref 15–41)
Albumin: 4.3 g/dL (ref 3.5–5.0)
BILIRUBIN TOTAL: 0.5 mg/dL (ref 0.3–1.2)
BUN: 16 mg/dL (ref 8–23)
CALCIUM: 10 mg/dL (ref 8.9–10.3)
CO2: 25 mmol/L (ref 22–32)
Chloride: 101 mmol/L (ref 98–111)
Creatinine, Ser: 0.85 mg/dL (ref 0.44–1.00)
GFR calc Af Amer: >60 mL/min (ref >60)
Glucose, Bld: 159 mg/dL — ABNORMAL HIGH (ref 70–99)
POTASSIUM: 4.2 mmol/L (ref 3.5–5.1)
Sodium: 136 mmol/L (ref 135–145)
TOTAL PROTEIN: 6.6 g/dL (ref 6.5–8.1)

## 2017-09-15 LAB — CBC WITH DIFFERENTIAL/PLATELET
BASOS ABS: 0 10*3/uL (ref 0.0–0.1)
BASOS PCT: 1 %
Eosinophils Absolute: 0.1 10*3/uL (ref 0.0–0.5)
Eosinophils Relative: 1 %
HEMATOCRIT: 33.9 % — AB (ref 34.8–46.6)
HEMOGLOBIN: 11.4 g/dL — AB (ref 11.6–15.9)
Lymphocytes Relative: 23 %
Lymphs Abs: 1.7 10*3/uL (ref 0.9–3.3)
MCH: 30 pg (ref 25.1–34.0)
MCHC: 33.7 g/dL (ref 31.5–36.0)
MCV: 89.1 fL (ref 79.5–101.0)
Monocytes Absolute: 0.6 10*3/uL (ref 0.1–0.9)
Monocytes Relative: 8 %
NEUTROS ABS: 5 10*3/uL (ref 1.5–6.5)
NEUTROS PCT: 67 %
Platelets: 271 10*3/uL (ref 145–400)
RBC: 3.8 MIL/uL (ref 3.70–5.45)
RDW: 14.8 % — ABNORMAL HIGH (ref 11.2–14.5)
WBC: 7.4 10*3/uL (ref 3.9–10.3)

## 2017-09-15 NOTE — Telephone Encounter (Signed)
Gave patient avs and calendar.   °

## 2017-09-16 ENCOUNTER — Encounter: Payer: Self-pay | Admitting: Hematology and Oncology

## 2017-09-16 NOTE — Assessment & Plan Note (Signed)
She has frequent urinary tract infection I will defer to her primary doctor for further management

## 2017-09-16 NOTE — Assessment & Plan Note (Signed)
This is likely anemia of chronic disease. The patient denies recent history of bleeding such as epistaxis, hematuria or hematochezia. She is asymptomatic from the anemia. We will observe for now.  

## 2017-09-16 NOTE — Assessment & Plan Note (Signed)
She is taking Arimidex since 07/14/2015 as part of the adjuvant treatment for breast cancer. So far, she tolerated treatment well without any side effects. I will continue to see her on the regular basis including breast examination and will make sure that she get regular screening mammogram Her recent screening mammogram in February 2019 was normal Examination is benign today She will continue Arimidex for  5 years.

## 2017-09-16 NOTE — Progress Notes (Signed)
[place] OFFICE PROGRESS NOTE  Patient Care Team: Deland Pretty, MD as PCP - General (Internal Medicine) Adrian Prows, MD as Consulting Physician (Cardiology) Heath Lark, MD as Consulting Physician (Hematology and Oncology) Fanny Skates, MD as Consulting Physician (General Surgery) Eppie Gibson, MD as Attending Physician (Radiation Oncology) Sylvan Cheese, NP as Nurse Practitioner (Hematology and Oncology) Thompson Grayer, MD as Consulting Physician (Cardiology) Lahoma Rocker, MD as Consulting Physician (Rheumatology) Terrance Mass, MD as Consulting Physician (Gynecology)  ASSESSMENT & PLAN:  Breast cancer of upper-inner quadrant of right female breast [facility]) She is taking Arimidex since 07/14/2015 as part of the adjuvant treatment for breast cancer. So far, she tolerated treatment well without any side effects. I will continue to see her on the regular basis including breast examination and will make sure that she get regular screening mammogram Her recent screening mammogram in February 2019 was normal Examination is benign today She will continue Arimidex for  5 years.  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 The patient denies recent history of bleeding such as epistaxis, hematuria or hematochezia. She is asymptomatic from the anemia. We will observe for now.   UTI (urinary tract infection) She has frequent urinary tract infection I will defer to her primary doctor for further management   No orders of the defined types were placed in this encounter.   INTERVAL HISTORY: Please see below for problem oriented  charting. She returns with her husband for further follow-up She is currently on doxycycline since a month ago for recurrent urinary tract infection She denies new lymphadenopathy She denies any recent abnormal breast examination, palpable mass, abnormal breast appearance or nipple changes She is currently receiving Prolia injections through her primary doctor for osteoporosis management The patient denies any recent signs or symptoms of bleeding such as spontaneous epistaxis, hematuria or hematochezia. 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      01/03/2014 Bone Marrow Biopsy    Accession: [id number] Bone marrow biopsy is negative      01/04/2014 Imaging    ECHO showed normal EF      01/04/2014 Procedure    She has placement of port      01/09/2014 - 03/07/2014 Chemotherapy    She was given treatment with bendamustine with rituximab.  Treatment was stopped due to severe side-effects despite significant dose adjustment for cycle 2      01/18/2014 - 02/01/2014 Hospital Admission    She was admitted to the hospital from Escherichia coli sepsis with multiorgan failure and brief episodes of intubation. She was discharged to skilled nursing  facility      02/26/2014 Imaging    PET/CT scan showed near complete remission      02/27/2014 Adverse Reaction    Cycle 2 of treatment was resumed with drastic dose adjustment to bendamustine due to recent multi-organ failure      04/03/2014 - 03/13/2015 Chemotherapy    She is started on maintenance rituximab only.      04/09/2014 - 04/12/2014 Hospital Admission    The patient was admitted to the hospital with urinary tract infection and sepsis.      06/05/2014 Imaging    PET CT scan showed complete response to Rx      02/12/2015 Imaging    Ct scan showed no evidence of disease. It shows she has new pneumonia      04/02/2015 Miscellaneous    She received 1 dose IVIG complicated by mild infusion reaction      04/26/2015 - 04/30/2015 Hospital Admission    She had recurrent admission to the hospital with sepsis      10/22/2015 Imaging    CT scan of chest and abdomen showed Ill-defined tissue in the porta hepatis suggest treated lymphoma. No evidence of lymphadenopathy in the chest, abdomen & pelvis to suggest recurrent lymphoma.      10/20/2016 Imaging    1. Stable appearance of ill defined soft tissue within the porta hepatis compatible with treated lymphoma. 2. No new findings identified. 3. Aortic Atherosclerosis (ICD10-I70.0). LAD coronary artery calcifications noted.      04/01/2017 Procedure    Status post right IJ port catheter removal       Breast cancer of upper-inner quadrant of right female breast ([place])   11/06/2014 Imaging    DEXA scan showed osteopenia T-1.9 on bilateral femoral neck      03/31/2015 Imaging    Screening mammogram showed suspicious lesion, confirmed on diagnostic imaging at 2 and 230 position on the right breast      04/10/2015 Pathology Results    Accession: [id number] breast biopsy in 2 locations came back invasive ductal carcinoma with calcification, 100% ER and PR positive, HER 2 neg      04/10/2015 Clinical Stage    Stage IA: T1 N0       05/27/2015 Surgery    Right total mastectomy: multifocal IDC, 1.5 and 1.0 cm, neg margins, ER+ (100% - both); PR+ (100% and 95%), HER2neu negative (ratio 1.69 and 1.14) Ki67 2% and 10%. DCIS      05/27/2015 Pathologic Stage    Stage IA: mpT1c pNx pMx      06/17/2015 Survivorship    Survivorship care plan mailed to patient at her request      07/14/2015 -  Anti-estrogen oral therapy    She started taking Arimidex      04/01/2016 Imaging    No mammographic evidence of malignancy in the left breast.       REVIEW OF SYSTEMS:   Constitutional: Denies fevers, chills or abnormal weight loss Eyes: Denies blurriness of vision Ears, nose, mouth, throat, and face: Denies mucositis or sore throat Respiratory: Denies cough, dyspnea or wheezes Cardiovascular: Denies palpitation, chest discomfort or lower extremity swelling Gastrointestinal:  Denies nausea, heartburn or change  in bowel habits Skin: Denies abnormal skin rashes Lymphatics: Denies new lymphadenopathy or easy bruising Neurological:Denies numbness, tingling or new weaknesses Behavioral/Psych: Mood is stable, no new changes  All other systems were reviewed with the patient and are negative.  I have reviewed the past medical history, past surgical history, social history and family history with the patient and they are unchanged from previous note.  ALLERGIES:  is allergic to morphine and related; multaq [dronedarone]; demerol; oysters [shellfish allergy]; penicillins; and sulfa drugs cross reactors.  MEDICATIONS:  Current Outpatient Medications  Medication Sig Dispense Refill  . acetaminophen (TYLENOL) 500 MG tablet Take 1,000 mg by mouth every 6 (six) hours as needed for moderate pain, fever or headache.    . albuterol (PROVENTIL HFA;VENTOLIN HFA) 108 (90 Base) MCG/ACT inhaler Inhale into the lungs as directed.    Marland Kitchen albuterol (PROVENTIL) (2.5 MG/3ML) 0.083% nebulizer solution Inhale 3 mLs into the lungs every 6 (six) hours as  needed for wheezing or shortness of breath.     . anastrozole (ARIMIDEX) 1 MG tablet TAKE ONE TABLET BY MOUTH EVERY DAY 90 tablet 9  . Cholecalciferol (VITAMIN D) 2000 UNITS tablet Take 2,000 Units by mouth daily with lunch.    . diclofenac sodium (VOLTAREN) 1 % GEL Apply 1 application topically 4 (four) times daily as needed.    . diltiazem (TIAZAC) 120 MG 24 hr capsule Take 120 mg by mouth daily.    Marland Kitchen doxycycline (VIBRAMYCIN) 100 MG capsule Take 100 mg by mouth daily.    . Fluticasone-Salmeterol (ADVAIR) 250-50 MCG/DOSE AEPB Inhale 2 puffs into the lungs every 12 (twelve) hours.     . gabapentin (NEURONTIN) 300 MG capsule Take 300 mg daily by mouth.    . hydrochlorothiazide (HYDRODIURIL) 25 MG tablet Take 37.5 mg by mouth daily as needed (as needed for leg swelling).    Marland Kitchen levothyroxine (SYNTHROID, LEVOTHROID) 200 MCG tablet Take 200 mcg by mouth daily before breakfast.    . lidocaine-prilocaine (EMLA) cream Apply 1 application topically as needed. Apply to [facility] a Cath site at least one hour prior to needle stick as needed. 30 g 10  . Magnesium Oxide 500 MG TABS Take 500 mg by mouth daily.     . metFORMIN (GLUCOPHAGE-XR) 500 MG 24 hr tablet Take 500 mg by mouth 2 (two) times daily with a meal.    . montelukast (SINGULAIR) 10 MG tablet Take 10 mg by mouth daily.    . Omega-3-acid Ethyl Esters (LOVAZA PO) Take 1 capsule by mouth 2 (two) times daily.    . ondansetron (ZOFRAN) 8 MG tablet Take 1 tablet (8 mg total) by mouth every 8 (eight) hours as needed for nausea. 60 tablet 3  . pravastatin (PRAVACHOL) 20 MG tablet 1/2 tab mon-thurs    . rivaroxaban (XARELTO) 20 MG TABS tablet Take 20 mg by mouth daily with supper.    . traMADol-acetaminophen (ULTRACET) 37.5-325 MG tablet Take 1 tablet by mouth every 6 (six) hours as needed (Pain).    . triamterene-hydrochlorothiazide (MAXZIDE-25) 37.5-25 MG tablet Take 1 tablet by mouth daily.    . vitamin B-12 (CYANOCOBALAMIN) 500 MCG tablet Take 500 mcg by  mouth daily.     No current facility-administered medications for this visit.     PHYSICAL EXAMINATION: ECOG PERFORMANCE STATUS: 1 - Symptomatic but completely ambulatory  Vitals:   09/15/17 1306  BP: (!) 124/54  Pulse: 100  Resp: 18  Temp: 97.8 F (36.6 C)  SpO2: 100%   Filed Weights  09/15/17 1306  Weight: 141 lb 1.6 oz (64 kg)    GENERAL:alert, no distress and comfortable SKIN: skin color, texture, turgor are normal, no rashes or significant lesions EYES: normal, Conjunctiva are pink and non-injected, sclera clear OROPHARYNX:no exudate, no erythema and lips, buccal mucosa, and tongue normal  NECK: supple, thyroid normal size, non-tender, without nodularity LYMPH:  no palpable lymphadenopathy in the cervical, axillary or inguinal LUNGS: clear to auscultation and percussion with normal breathing effort HEART: Irregular rate and rhythm, no murmurs and no lower extremity edema ABDOMEN:abdomen soft, non-tender and normal bowel sounds Musculoskeletal:no cyanosis of digits and no clubbing  NEURO: alert & oriented x 3 with fluent speech, no focal motor/sensory deficits Bilateral breast examination were performed.  Well-healed surgical scar with no other abnormalities  LABORATORY DATA:  I have reviewed the data as listed    Component Value Date/Time   NA 136 09/15/2017 1250   NA 140 01/27/2017 0811   K 4.2 09/15/2017 1250   K 3.9 01/27/2017 0811   CL 101 09/15/2017 1250   CO2 25 09/15/2017 1250   CO2 25 01/27/2017 0811   GLUCOSE 159 (H) 09/15/2017 1250   GLUCOSE 122 01/27/2017 0811   BUN 16 09/15/2017 1250   BUN 16.9 01/27/2017 0811   CREATININE 0.85 09/15/2017 1250   CREATININE 0.8 01/27/2017 0811   CALCIUM 10.0 09/15/2017 1250   CALCIUM 9.9 01/27/2017 0811   PROT 6.6 09/15/2017 1250   PROT 6.9 01/27/2017 0811   ALBUMIN 4.3 09/15/2017 1250   ALBUMIN 4.4 01/27/2017 0811   AST 15 09/15/2017 1250   AST 15 01/27/2017 0811   ALT 19 09/15/2017 1250   ALT 15  01/27/2017 0811   ALKPHOS 96 09/15/2017 1250   ALKPHOS 77 01/27/2017 0811   BILITOT 0.5 09/15/2017 1250   BILITOT 0.65 01/27/2017 0811   GFRNONAA >60 09/15/2017 1250   GFRAA >60 09/15/2017 1250    No results found for: SPEP, UPEP  Lab Results  Component Value Date   WBC 7.4 09/15/2017   NEUTROABS 5.0 09/15/2017   HGB 11.4 (L) 09/15/2017   HCT 33.9 (L) 09/15/2017   MCV 89.1 09/15/2017   PLT 271 09/15/2017      Chemistry      Component Value Date/Time   NA 136 09/15/2017 1250   NA 140 01/27/2017 0811   K 4.2 09/15/2017 1250   K 3.9 01/27/2017 0811   CL 101 09/15/2017 1250   CO2 25 09/15/2017 1250   CO2 25 01/27/2017 0811   BUN 16 09/15/2017 1250   BUN 16.9 01/27/2017 0811   CREATININE 0.85 09/15/2017 1250   CREATININE 0.8 01/27/2017 0811      Component Value Date/Time   CALCIUM 10.0 09/15/2017 1250   CALCIUM 9.9 01/27/2017 0811   ALKPHOS 96 09/15/2017 1250   ALKPHOS 77 01/27/2017 0811   AST 15 09/15/2017 1250   AST 15 01/27/2017 0811   ALT 19 09/15/2017 1250   ALT 15 01/27/2017 0811   BILITOT 0.5 09/15/2017 1250   BILITOT 0.65 01/27/2017 0811       All questions were answered. The patient knows to call the clinic with any problems, questions or concerns. No barriers to learning was detected.  I spent 15 minutes counseling the patient face to face. The total time spent in the appointment was 20 minutes and more than 50% was on counseling and review of test results  Heath Lark, MD 09/16/2017 7:46 AM

## 2017-09-16 NOTE — Assessment & Plan Note (Signed)
Her last CT scan which showed no evidence of active disease She has completed chemotherapy treatment, no signs of recurrent for 2 years I will continue to examine her in 6 months I reminded her the importance of yearly influenza vaccination

## 2017-09-19 DIAGNOSIS — E1101 Type 2 diabetes mellitus with hyperosmolarity with coma: Secondary | ICD-10-CM | POA: Diagnosis not present

## 2017-09-19 DIAGNOSIS — G629 Polyneuropathy, unspecified: Secondary | ICD-10-CM | POA: Diagnosis not present

## 2017-09-21 DIAGNOSIS — E1101 Type 2 diabetes mellitus with hyperosmolarity with coma: Secondary | ICD-10-CM | POA: Diagnosis not present

## 2017-09-21 DIAGNOSIS — G629 Polyneuropathy, unspecified: Secondary | ICD-10-CM | POA: Diagnosis not present

## 2017-09-27 DIAGNOSIS — E1101 Type 2 diabetes mellitus with hyperosmolarity with coma: Secondary | ICD-10-CM | POA: Diagnosis not present

## 2017-09-27 DIAGNOSIS — G629 Polyneuropathy, unspecified: Secondary | ICD-10-CM | POA: Diagnosis not present

## 2017-10-06 DIAGNOSIS — I484 Atypical atrial flutter: Secondary | ICD-10-CM | POA: Diagnosis not present

## 2017-10-06 DIAGNOSIS — R5381 Other malaise: Secondary | ICD-10-CM | POA: Diagnosis not present

## 2017-10-06 DIAGNOSIS — I10 Essential (primary) hypertension: Secondary | ICD-10-CM | POA: Diagnosis not present

## 2017-10-06 DIAGNOSIS — M810 Age-related osteoporosis without current pathological fracture: Secondary | ICD-10-CM | POA: Diagnosis not present

## 2017-10-06 DIAGNOSIS — I480 Paroxysmal atrial fibrillation: Secondary | ICD-10-CM | POA: Diagnosis not present

## 2017-10-06 DIAGNOSIS — E11610 Type 2 diabetes mellitus with diabetic neuropathic arthropathy: Secondary | ICD-10-CM | POA: Diagnosis not present

## 2017-10-06 DIAGNOSIS — E039 Hypothyroidism, unspecified: Secondary | ICD-10-CM | POA: Diagnosis not present

## 2017-10-11 ENCOUNTER — Ambulatory Visit (HOSPITAL_COMMUNITY): Payer: Medicare Other | Admitting: Anesthesiology

## 2017-10-11 ENCOUNTER — Other Ambulatory Visit: Payer: Self-pay

## 2017-10-11 ENCOUNTER — Encounter (HOSPITAL_COMMUNITY): Payer: Self-pay

## 2017-10-11 ENCOUNTER — Encounter (HOSPITAL_COMMUNITY)
Admission: RE | Disposition: A | Discharge status: Home / Self Care | Payer: Self-pay | Source: Ambulatory Visit | Attending: Cardiology

## 2017-10-11 ENCOUNTER — Ambulatory Visit (HOSPITAL_COMMUNITY)
Admission: RE | Admit: 2017-10-11 | Discharge: 2017-10-11 | Disposition: A | Discharge status: Home / Self Care | Payer: Medicare Other | Source: Ambulatory Visit | Attending: Cardiology | Admitting: Cardiology

## 2017-10-11 DIAGNOSIS — Z888 Allergy status to other drugs, medicaments and biological substances status: Secondary | ICD-10-CM | POA: Diagnosis not present

## 2017-10-11 DIAGNOSIS — E039 Hypothyroidism, unspecified: Secondary | ICD-10-CM | POA: Diagnosis not present

## 2017-10-11 DIAGNOSIS — Z8249 Family history of ischemic heart disease and other diseases of the circulatory system: Secondary | ICD-10-CM | POA: Insufficient documentation

## 2017-10-11 DIAGNOSIS — Z79899 Other long term (current) drug therapy: Secondary | ICD-10-CM | POA: Insufficient documentation

## 2017-10-11 DIAGNOSIS — Z90710 Acquired absence of both cervix and uterus: Secondary | ICD-10-CM | POA: Diagnosis not present

## 2017-10-11 DIAGNOSIS — Z823 Family history of stroke: Secondary | ICD-10-CM | POA: Diagnosis not present

## 2017-10-11 DIAGNOSIS — I480 Paroxysmal atrial fibrillation: Secondary | ICD-10-CM | POA: Diagnosis not present

## 2017-10-11 DIAGNOSIS — Z885 Allergy status to narcotic agent status: Secondary | ICD-10-CM | POA: Insufficient documentation

## 2017-10-11 DIAGNOSIS — E119 Type 2 diabetes mellitus without complications: Secondary | ICD-10-CM | POA: Diagnosis not present

## 2017-10-11 DIAGNOSIS — Z882 Allergy status to sulfonamides status: Secondary | ICD-10-CM | POA: Insufficient documentation

## 2017-10-11 DIAGNOSIS — R0609 Other forms of dyspnea: Secondary | ICD-10-CM | POA: Insufficient documentation

## 2017-10-11 DIAGNOSIS — I483 Typical atrial flutter: Secondary | ICD-10-CM | POA: Diagnosis not present

## 2017-10-11 DIAGNOSIS — Z9011 Acquired absence of right breast and nipple: Secondary | ICD-10-CM | POA: Diagnosis not present

## 2017-10-11 DIAGNOSIS — Z880 Allergy status to penicillin: Secondary | ICD-10-CM | POA: Diagnosis not present

## 2017-10-11 DIAGNOSIS — Z79890 Hormone replacement therapy (postmenopausal): Secondary | ICD-10-CM | POA: Insufficient documentation

## 2017-10-11 DIAGNOSIS — Z7901 Long term (current) use of anticoagulants: Secondary | ICD-10-CM | POA: Insufficient documentation

## 2017-10-11 DIAGNOSIS — I440 Atrioventricular block, first degree: Secondary | ICD-10-CM | POA: Insufficient documentation

## 2017-10-11 DIAGNOSIS — G4731 Primary central sleep apnea: Secondary | ICD-10-CM | POA: Insufficient documentation

## 2017-10-11 DIAGNOSIS — I10 Essential (primary) hypertension: Secondary | ICD-10-CM | POA: Diagnosis not present

## 2017-10-11 DIAGNOSIS — E7800 Pure hypercholesterolemia, unspecified: Secondary | ICD-10-CM | POA: Diagnosis not present

## 2017-10-11 DIAGNOSIS — Z9889 Other specified postprocedural states: Secondary | ICD-10-CM | POA: Diagnosis not present

## 2017-10-11 DIAGNOSIS — E1169 Type 2 diabetes mellitus with other specified complication: Secondary | ICD-10-CM | POA: Diagnosis not present

## 2017-10-11 DIAGNOSIS — Z7984 Long term (current) use of oral hypoglycemic drugs: Secondary | ICD-10-CM | POA: Diagnosis not present

## 2017-10-11 DIAGNOSIS — Z7951 Long term (current) use of inhaled steroids: Secondary | ICD-10-CM | POA: Diagnosis not present

## 2017-10-11 DIAGNOSIS — I4892 Unspecified atrial flutter: Secondary | ICD-10-CM | POA: Diagnosis not present

## 2017-10-11 DIAGNOSIS — Z9049 Acquired absence of other specified parts of digestive tract: Secondary | ICD-10-CM | POA: Diagnosis not present

## 2017-10-11 DIAGNOSIS — E785 Hyperlipidemia, unspecified: Secondary | ICD-10-CM | POA: Diagnosis not present

## 2017-10-11 HISTORY — PX: CARDIOVERSION: SHX1299

## 2017-10-11 LAB — GLUCOSE, CAPILLARY: GLUCOSE-CAPILLARY: 107 mg/dL — AB (ref 70–99)

## 2017-10-11 SURGERY — CARDIOVERSION
Anesthesia: General

## 2017-10-11 MED ORDER — LIDOCAINE 2% (20 MG/ML) 5 ML SYRINGE
INTRAMUSCULAR | Status: DC | PRN
  Administered 2017-10-11: 20 mg via INTRAVENOUS

## 2017-10-11 MED ORDER — SODIUM CHLORIDE 0.9 % IV SOLN
INTRAVENOUS | Status: DC | PRN
  Administered 2017-10-11: 13:00:00 via INTRAVENOUS

## 2017-10-11 MED ORDER — PROPOFOL 10 MG/ML IV BOLUS
INTRAVENOUS | Status: DC | PRN
  Administered 2017-10-11: 40 mg via INTRAVENOUS

## 2017-10-11 NOTE — Anesthesia Preprocedure Evaluation (Signed)
Anesthesia Evaluation  Patient identified by MRN, date of birth, ID band Patient awake    Reviewed: Allergy & Precautions, NPO status , Patient's Chart, lab work & pertinent test results  Airway Mallampati: II  TM Distance: >3 FB     Dental   Pulmonary asthma , sleep apnea ,    breath sounds clear to auscultation       Cardiovascular hypertension, Pt. on medications + dysrhythmias Atrial Fibrillation  Rhythm:Irregular Rate:Normal     Neuro/Psych negative neurological ROS     GI/Hepatic negative GI ROS, Neg liver ROS,   Endo/Other  diabetes, Type 2Hypothyroidism   Renal/GU negative Renal ROS     Musculoskeletal  (+) Arthritis ,   Abdominal   Peds  Hematology negative hematology ROS (+)   Anesthesia Other Findings   Reproductive/Obstetrics                             Anesthesia Physical Anesthesia Plan  ASA: III  Anesthesia Plan: General   Post-op Pain Management:    Induction: Intravenous  PONV Risk Score and Plan: 3 and Treatment may vary due to age or medical condition  Airway Management Planned: Mask and Natural Airway  Additional Equipment:   Intra-op Plan:   Post-operative Plan:   Informed Consent: I have reviewed the patients History and Physical, chart, labs and discussed the procedure including the risks, benefits and alternatives for the proposed anesthesia with the patient or authorized representative who has indicated his/her understanding and acceptance.     Plan Discussed with: CRNA  Anesthesia Plan Comments:         Anesthesia Quick Evaluation

## 2017-10-11 NOTE — Transfer of Care (Signed)
Immediate Anesthesia Transfer of Care Note  Patient: Patty [name]  Procedure(s) Performed: CARDIOVERSION (N/A )  Patient Location: Endoscopy Unit  Anesthesia Type:General  Level of Consciousness: awake, alert  and oriented  Airway & Oxygen Therapy: Patient Spontanous Breathing  Post-op Assessment: Report given to RN and Post -op Vital signs reviewed and stable  Post vital signs: Reviewed and stable  Last Vitals:  Vitals Value Taken Time  BP    Temp    Pulse    Resp    SpO2      Last Pain:  Vitals:   10/11/17 1253  TempSrc: Oral  PainSc: 0-No pain         Complications: No apparent anesthesia complications

## 2017-10-11 NOTE — H&P (Signed)
Patty [name] 10/06/2017 11:30 AM Location: [facility] Patient #: [id number] DOB: [birth year] Married / Language: English / Race: White Female   History of Present Illness Patty Maine FNP-C; 10/06/2017 1:14 PM) Patient words: 6 month f/u for PAF; Last OV 04/07/2017.  The patient is a 77 year old female who presents for a Follow-up for Atrial fibrillation. [name] is a pleasant Caucasian female with history of paroxysmal atrial flutter/atrial fibrillation, symptomatic with fatigue and dyspnea, has had ablation on 08/19/2015 by Dr. Thompson Grayer. She has also had lymphoma which was treated in 2015 and is in remission, right breast cancer, status post lumpectomy in 2017 followed by chemotherapy therapy. She is presently on long-term and correlation. Medical history is also significant for controlled diabetes mellitus, hyperlipidemia, hypertension. Her last recurrent cardioversion was in Aug 2018.  Patient presents for 6 month follow-up visit for paroxysmal atrial fibrillation and flutter. Continues to have fatigue and tiredness by the end of the day it is unchanged since undergoing treatment for lymphoma. Has shortness of breath when doing things, but does not feel that this is new to her. Denies any heart racing, chest pain, or syncope. Reports anemia has remained stable. She is on Xarelto for anticoagulation and tolerating well without any bleeding diathesis.   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 Katharina Caper  04/01/2017: RBC 3.63, hemoglobin 11.2, hematocrit 32.6, normal indices. Iron studies normal. 10/21/2016: Creatinine 0.7, glucose 105, EGFR 74, potassium 4.4, BMP otherwise normal. Labs 10/15/2016: Iron saturation 11%, reduced. A1c 6.4%. Total cholesterol 144, triglycerides 134, HDL 48, LDL 69. Non-HDL cholesterol 96. TSH normal. Vitamin D 33. Uric acid 4.8. HB 10.0/HCT 31.7, platelets 253, normal indicis. BUN 20, creatinine 0.9, eGFR 64 mL, potassium 4.3, CMP normal. Labs 07/30/2016: HB 9.9/HCT 29.8, normal indicis, platelets 247. Potassium 3.8, serum glucose 153, BUN 16, creatinine 0.8. EGFR 74 mL. CMP otherwise normal. 06/10/2015: Glucose 130, creatinine 0.98, eGFR 56, potassium 4.7 04/30/2015: TSH 4.198 04/29/2015: HB 10.2/HCT 30 with normocytic indices, serum glucose 132, creatinine 0.72, potassium 3.6, sodium 133, magnesium 1.5 Malaise and fatigue (R53.81, R53.83)  Treadmill exercise stress test 10/29/2016: Indication: SoB and Chrontrophic Evaluation Resting EKG demonstrates NSR. The patient exercised according to a Modified Bruce Protocol, Total time recorded 9:20 min achieving max heart rate of 121 which was 85% of THR for age and 5.37 METS of work. Stress terminated due to fatigue &THR (>85% MPHR)/MPHR met. Normal BP response. There was no ST-T changes of ischemia with exercise stress test. There were no significant arrhythmias. Rec: No e/o ischemia by GXT using Modified Bruce protocol. No Exercise induced arrhythmias. Normal chronotropic response. Exercise tolerence is low normal. Continue Preventive therapy. Paroxysmal atrial fibrillation (I48.0) [05/27/2010]: CHA2DS2-VASc Score is 5 with yearly risk of stroke of 6.7 %. Bleeding risk is 1 %/year. Atrial Fib And atrial flutter Ablation-Dr Thompson Grayer 08/19/2015 Direct current cardioversion 10/26/2016, 06/18/2015:  73 J: Atypical A. Flutter to sinus with 1st degree AVB. Heart cath 2010 Mild coronary calcification, Normal  EF. Stress cardiolite 3/12 ([facility]) no ischemia . Admitted to [facility] in March 2012 for A. Fibrillation with RVR. Echocardiogram (hospital) 01/04/2014: Normal left ventricle systolic function, moderate LVH, mild mitral valve regurgitation and right cuspid valve regurgitation. No pericardial effusion. H/O radiofrequency ablation for complex left atrial arrhythmia (L27.517) [08/19/2015]: Atrial Fib And atrial flutter Ablation-Dr Thompson Grayer 08/19/2015.  Allergies Frances Furbish Johnson; 10-21-2017 11:24 AM) Demerol *ANALGESICS - OPIOID*  Vomiting. Penicillin V *PENICILLINS*  Rash. SulfADIAZINE *SULFONAMIDES*  Rash. Multaq *ANTIARRHYTHMICS*  Rash. Penicillamine *ASSORTED CLASSES*   Family History Cheri Kearns; 10/21/2017 11:24 AM) Mother  Deceased. at age 83, from pneumonia; known CHF, h/o of MI at age 59. Father  Deceased. at age 9, from a Stroke; h/o MI at age 48, HTN Brother 1  Deceased. at age 16 from MI; no other heart attacks or strokes, no other cardiovascular conditions; 10 yrs older Sister 1  Deceased. at age 20 from cancer; stents and a pacemaker; no strokes or heart attacks, no other cardiovascular conditions; 6 yrs older  Social History Cheri Kearns; Oct 21, 2017 11:24 AM) Current tobacco use  Never smoker. Non Drinker/No Alcohol Use  Marital status  Married. Number of Children  2. Living Situation  Lives with spouse.  Past Surgical History Frances Furbish Johnson; 10/21/2017 11:24 AM) Tonsillectomy 1950.  Cardiac Ablation  Atrial Fib And atrial flutter Ablation-Dr Thompson Grayer 08/19/2015. Appendectomy 1953  Hysterectomy in the 70's.  2 back surgeries in 1984 and 1986.  Cholecystectomy 1998.  Cataract surgery in both eyes 1992.  Mastectomy; Total - Right [05/27/2015]:  Medication History Frances Furbish Johnson; 21-Oct-2017 11:38 AM) dilTIAZem HCl ER (120MG  Capsule ER 24HR, 1 (one) Capsul Capsule ER 24H Oral daily, Taken starting 07/06/2017) Active. Xarelto (20MG Tablet, 1 (one) T Ta Tablet Tabl Oral every evening with dinner, Taken starting 11/29/2016) Active. Singulair (10MG Tablet, 1 Oral daily) Active. Advair Diskus (250-50MCG/DOSE Aero Pow Br Act, 1 puff Inhalation two times daily) Active. Lovaza (1GM Capsule, 2 Oral daily) Active. Vitamin D3 (2000UNIT Capsule, 1 Oral daily) Active. Tramadol-Acetaminophen (37.5-325MG Tablet, Oral as needed) Active. Magnesium Oxide (500MG Tablet, 1 Oral daily) Active. MetFORMIN HCl ER (500MG Tablet ER 24HR, 1 Oral two times daily, with food) Active. Pravastatin Sodium (40MG Tablet, 1/2 tablet Oral four days a week) Active. (Mon-Thurs) ProAir HFA (108 (90 Base)MCG/ACT Aerosol Soln, 2 puffs Inhalation every 6 hours as needed) Active. Synthroid (200MCG Tablet, 1 Oral daily) Active. (Dr Shelia Media changed due to elevated heart rate) Albuterol Sulfate ((2.5 MG/3ML)0.083% Nebulized Soln, 1 vial Inhalation as needed) Active. Triamterene-HCTZ (37.5-25MG Tablet, 1 Oral as needed for swelling) Active. Anastrozole (1MG Tablet, 1 Oral daily) Active. Doxycycline Hyclate (100MG Capsule, 1 Oral at bedtime) Active. Diclofenac Sodium (1% Gel, Transdermal as needed) Active. Vitamin B12 (1 Oral daily) Specific strength unknown - Active. Gabapentin (300MG Capsule, 1 Oral two times daily) Active. Medications Reconciled (verbally no list)  Diagnostic Studies History Frances Furbish Johnson; 10-21-2017 11:24 AM) Sleep study 2004 (Dx'd with sleep apnea-sleeps with CPAP every night).  CT Scan of Chest [02/12/2015]: Atherosclerotic calcification of the arterial vasculature without AAA. New peribronchovesicular ground glass and nodular consolidation in both lower lobes, most indicative of bronchopneumonia. MRI [08/05/2016]: Lower back  Other Problems Frances Furbish Johnson; October 21, 2017 11:24 AM) Heart cath 2010 (no stents-Dr. Peter  [place]) Mild coronary calcification, Normal EF. Admitted to [facility] in March 2012 for A. Fibrillation with RVR. Stress cardiolite 3/12 [facility]) no ischemia .  Echo 2012 mild LVH. Normal EF.     Review of  Systems Patty Maine FNP-C; 10/06/2017 12:38 PM) General Present- Fatigue. Not Present- Fever and Night Sweats. Respiratory Present- Decreased Exercise Tolerance (unchanged) and Difficulty Breathing on Exertion (unchanged). Not Present- Chronic Cough, Hemoptysis and Wakes up from Sleep Wheezing or Short of Breath. Cardiovascular Not Present- Chest Pain, Claudications, Fainting, Orthopnea, Paroxysmal Nocturnal Dyspnea and Swelling of Extremities. Gastrointestinal Not Present- Abdominal Pain, Black, Tarry Stool, Constipation, Diarrhea, Nausea and Vomiting. Musculoskeletal Present- Joint Pain (knee and ankle) and Joint Stiffness. Neurological Not Present- Dizziness, Focal Neurological Symptoms, Headaches and Syncope. Hematology Not Present- Blood Clots, Easy Bruising and Nose Bleed. All other systems negative  Vitals Frances Furbish Johnson; 10/06/2017 11:40 AM) 10/06/2017 11:29 AM Weight: 139.25 lb Height: 66.5in Body Surface Area: 1.72 m Body Mass Index: 22.14 kg/m  Pulse: 55 (Regular)  P.OX: 97% (Room air) BP: 155/65 (Sitting, Left Arm, Standard)       Physical Exam Patty Maine, FNP-C; 10/06/2017 1:14 PM) General Mental Status-Alert. General Appearance-Cooperative, Appears stated age, Not in acute distress. Orientation-Oriented X3. Build & Nutrition-Well nourished and Moderately built.  Head and Neck Thyroid Gland Characteristics - no palpable nodules, no palpable enlargement.  Chest and Lung Exam Chest and lung exam reveals -normal excursion with symmetric chest walls and quiet, even and easy respiratory effort with no use of accessory muscles. Palpation Tender - No chest wall tenderness.  Cardiovascular Cardiovascular examination reveals  -carotid auscultation reveals no bruits, femoral artery auscultation bilaterally reveals normal pulses, no bruits, no thrills, normal pedal pulses bilaterally and no digital clubbing, cyanosis, edema, increased warmth or tenderness. Inspection Jugular vein - Right - No Distention. Auscultation Rhythm - Regular. Heart Sounds - Normal heart sounds. Murmurs & Other Heart Sounds: Murmur - Location - Sternal Border - Left. Timing - Early systolic. Grade - I/VI.  Abdomen Palpation/Percussion Normal exam - Non Tender and No hepatosplenomegaly. Auscultation Normal exam - Bowel sounds normal.  Neurologic Motor-Grossly intact without any focal deficits.  Musculoskeletal Global Assessment Left Lower Extremity - normal range of motion without pain. Right Lower Extremity - normal range of motion without pain.    Assessment & Plan Patty Maine FNP-C; 10/06/2017 1:14 PM) Atypical atrial flutter (I48.4) Story: EKG 10/06/2017: Atrial flutter (atypical) with variable ventricular responce and controlled ventricular response at the rate of 92 bpm, left axis deviation, left anterior fascicular block. Right bundle branch block. Low-voltage complexes. Diffuse nonspecific T abnormality. Paroxysmal atrial fibrillation (I48.0) Story: CHA2DS2-VASc Score is 5 with yearly risk of stroke of 6.7 %. Bleeding risk is 1 %/year.  Atrial Fib And atrial flutter Ablation-Dr Thompson Grayer 08/19/2015  Direct current cardioversion 10/26/2016, 06/18/2015: 73 J: Atypical A. Flutter to sinus with 1st degree AVB.  Heart cath 2010 Mild coronary calcification, Normal EF.  Stress cardiolite 3/12 [facility]) no ischemia . Admitted to [place] in March 2012 for A. Fibrillation with RVR.  Echocardiogram (hospital) 01/04/2014: Normal left ventricle systolic function, moderate LVH, mild mitral valve regurgitation and right cuspid valve regurgitation. No pericardial effusion. Impression: EKG 10/06/2017: Atrial flutter  (atypical) with variable ventricular responce and controlled ventricular response at the rate of 92 bpm, left axis deviation, left anterior fascicular block. Right under branch block. Low-voltage complexes. Diffuse nonspecific T abnormality.  EKG 04/07/2017: Sinus rhythm with first-degree AV block at the rate of 69 bpm, left axis deviation, left anterior vesicular block. Poor R-wave progression, anteroseptal infarct old. Nonspecific T abnormality. Normal QT interval. No significant change from EKG 11/05/2016.  EKG 10/11/2016: Atypical atrial flutter with variable ventricular response  at the rate of 72 bpm, leftward axis, poor R-wave progression, cannot exclude anteroseptal infarct old, nonspecific ST-T abnormality. Current Plans Complete electrocardiogram (93000) Essential hypertension, benign (I10) Malaise and fatigue (R53.81) Story: Treadmill exercise stress test 10/29/2016: Indication: SoB and Chrontrophic Evaluation Resting EKG demonstrates NSR. The patient exercised according to a Modified Bruce Protocol, Total time recorded 9:20 min achieving max heart rate of 121 which was 85% of THR for age and 5.37 METS of work. Stress terminated due to fatigue &THR (>85% MPHR)/MPHR met. Normal BP response. There was no ST-T changes of ischemia with exercise stress test. There were no significant arrhythmias. Rec: No e/o ischemia by GXT using Modified Bruce protocol. No Exercise induced arrhythmias. Normal chronotropic response. Exercise tolerence is low normal. Continue Preventive therapy. Laboratory examination (Z01.89) Story: 04/01/2017: RBC 3.63, hemoglobin 11.2, hematocrit 32.6, normal indices. Iron studies normal.  10/21/2016: Creatinine 0.7, glucose 105, EGFR 74, potassium 4.4, BMP otherwise normal.  Labs 10/15/2016: Iron saturation 11%, reduced. A1c 6.4%. Total cholesterol 144, triglycerides 134, HDL 48, LDL 69. Non-HDL cholesterol 96. TSH normal. Vitamin D 33. Uric acid 4.8. HB 10.0/HCT 31.7,  platelets 253, normal indicis. BUN 20, creatinine 0.9, eGFR 64 mL, potassium 4.3, CMP normal.  Labs 07/30/2016: HB 9.9/HCT 29.8, normal indicis, platelets 247. Potassium 3.8, serum glucose 153, BUN 16, creatinine 0.8. EGFR 74 mL. CMP otherwise normal.  06/10/2015: Glucose 130, creatinine 0.98, eGFR 56, potassium 4.7  04/30/2015: TSH 4.198  04/29/2015: HB 10.2/HCT 30 with normocytic indices, serum glucose 132, creatinine 0.72, potassium 3.6, sodium 133, magnesium 1.5  Note:- Recommendations:  [name] is a pleasant Caucasian female with history of paroxysmal atrial flutter/atrial fibrillation, symptomatic with fatigue and dyspnea, has had ablation on 08/19/2015 by Dr. Thompson Grayer. She has also had lymphoma which was treated in 2015 and is in remission, right breast cancer, status post lumpectomy in 2017 followed by chemotherapy therapy. She is presently on long-term and correlation. Medical history is also significant for controlled diabetes mellitus, hyperlipidemia, hypertension. Her last recurrent cardioversion was in Aug 2018 for reucrrent atrial flutter.  Patient is here on a six-month office visit and follow-up of proximal atrial fibrillation and hypertension. Blood pressure is well controlled but again she has noticed fatigue and malaise has set in. Today she is back in atrial fibrillation/atypical atrial flutter today with controlled ventricular response.  I suspect she is symptomatic atrial fibrillation. Previously she did not tolerate like an eye due to heart block and Multac caused her to have rash. I'm concerned about using a amiodarone in view of development of heart a and dofetilide be another option. For now would like to set her up for direct current cardioversion and see if her symptoms of fatigue will improve and indeed if she responds to cardioversion, then we can about starting her on antiarrhythmic therapy to maintain sinus rhythm. Patient is willing to  proceed.  CC: Dr. Deland Pretty.    Signed by Patty Maine, FNP-C (10/06/2017 1:15 PM)

## 2017-10-11 NOTE — Anesthesia Postprocedure Evaluation (Signed)
Anesthesia Post Note  Patient: Patty [name]  Procedure(s) Performed: CARDIOVERSION (N/A )     Patient location during evaluation: PACU Anesthesia Type: General Level of consciousness: awake and alert Pain management: pain level controlled Vital Signs Assessment: post-procedure vital signs reviewed and stable Respiratory status: spontaneous breathing, nonlabored ventilation, respiratory function stable and patient connected to nasal cannula oxygen Cardiovascular status: blood pressure returned to baseline and stable Postop Assessment: no apparent nausea or vomiting Anesthetic complications: no    Last Vitals:  Vitals:   10/11/17 1400 10/11/17 1410  BP: 126/61 134/75  Pulse: 78 78  Resp: 14 15  Temp:  36.7 C  SpO2: 98% 98%    Last Pain:  Vitals:   10/11/17 1410  TempSrc: Oral  PainSc: 0-No pain                 Tiajuana Amass

## 2017-10-11 NOTE — Progress Notes (Signed)
10/06/2017: Creatinine 0.81, EGFR 69/80, potassium 4.8, BMP otherwise normal.

## 2017-10-11 NOTE — Interval H&P Note (Signed)
History and Physical Interval Note:  10/11/2017 1:28 PM  Patty [name]  has presented today for surgery, with the diagnosis of AFLUTTER  The various methods of treatment have been discussed with the patient and family. After consideration of risks, benefits and other options for treatment, the patient has consented to  Procedure(s): CARDIOVERSION (N/A) as a surgical intervention .  The patient's history has been reviewed, patient examined, no change in status, stable for surgery.  I have reviewed the patient's chart and labs.  Questions were answered to the patient's satisfaction.     [place]

## 2017-10-11 NOTE — Discharge Instructions (Signed)
Electrical Cardioversion, Care After °This sheet gives you information about how to care for yourself after your procedure. Your health care provider may also give you more specific instructions. If you have problems or questions, contact your health care provider. °What can I expect after the procedure? °After the procedure, it is common to have: °· Some redness on the skin where the shocks were given. ° °Follow these instructions at home: °· Do not drive for 24 hours if you were given a medicine to help you relax (sedative). °· Take over-the-counter and prescription medicines only as told by your health care provider. °· Ask your health care provider how to check your pulse. Check it often. °· Rest for 48 hours after the procedure or as told by your health care provider. °· Avoid or limit your caffeine use as told by your health care provider. °Contact a health care provider if: °· You feel like your heart is beating too quickly or your pulse is not regular. °· You have a serious muscle cramp that does not go away. °Get help right away if: °· You have discomfort in your chest. °· You are dizzy or you feel faint. °· You have trouble breathing or you are short of breath. °· Your speech is slurred. °· You have trouble moving an arm or leg on one side of your body. °· Your fingers or toes turn cold or blue. °This information is not intended to replace advice given to you by your health care provider. Make sure you discuss any questions you have with your health care provider. °Document Released: 12/06/2012 Document Revised: 09/19/2015 Document Reviewed: 08/22/2015 °Elsevier Interactive Patient Education © 2018 [employer] ° °

## 2017-10-11 NOTE — CV Procedure (Signed)
Direct current cardioversion:  Indication symptomatic Atypical atrial flutter, symptomatic.  Procedure: Under deep sedation administered and monitored by anesthesiology, synchronized direct current cardioversion performed. Patient was delivered with 120, 150 Joules of electricity X 2 with success to NSR. Patient tolerated the procedure well. No immediate complication noted.   Given small flutter waves only seen on precordial leads, 12 lead EKG was obtained which showed probably sinus rhythm with long first degree AV block.  Nigel Mormon, MD [facility] Pager: [phone] Office: [phone] If no answer Cell [phone]

## 2017-10-12 DIAGNOSIS — E7800 Pure hypercholesterolemia, unspecified: Secondary | ICD-10-CM | POA: Diagnosis not present

## 2017-10-12 DIAGNOSIS — G4730 Sleep apnea, unspecified: Secondary | ICD-10-CM | POA: Diagnosis not present

## 2017-10-12 DIAGNOSIS — D638 Anemia in other chronic diseases classified elsewhere: Secondary | ICD-10-CM | POA: Diagnosis not present

## 2017-10-12 DIAGNOSIS — I10 Essential (primary) hypertension: Secondary | ICD-10-CM | POA: Diagnosis not present

## 2017-10-12 DIAGNOSIS — M810 Age-related osteoporosis without current pathological fracture: Secondary | ICD-10-CM | POA: Diagnosis not present

## 2017-10-12 DIAGNOSIS — Z0000 Encounter for general adult medical examination without abnormal findings: Secondary | ICD-10-CM | POA: Diagnosis not present

## 2017-10-12 DIAGNOSIS — I480 Paroxysmal atrial fibrillation: Secondary | ICD-10-CM | POA: Diagnosis not present

## 2017-10-12 DIAGNOSIS — C8590 Non-Hodgkin lymphoma, unspecified, unspecified site: Secondary | ICD-10-CM | POA: Diagnosis not present

## 2017-10-12 DIAGNOSIS — E039 Hypothyroidism, unspecified: Secondary | ICD-10-CM | POA: Diagnosis not present

## 2017-10-12 DIAGNOSIS — E11610 Type 2 diabetes mellitus with diabetic neuropathic arthropathy: Secondary | ICD-10-CM | POA: Diagnosis not present

## 2017-10-12 DIAGNOSIS — J45909 Unspecified asthma, uncomplicated: Secondary | ICD-10-CM | POA: Diagnosis not present

## 2017-10-12 DIAGNOSIS — D801 Nonfamilial hypogammaglobulinemia: Secondary | ICD-10-CM | POA: Diagnosis not present

## 2017-10-19 DIAGNOSIS — Z9889 Other specified postprocedural states: Secondary | ICD-10-CM | POA: Diagnosis not present

## 2017-10-19 DIAGNOSIS — I4892 Unspecified atrial flutter: Secondary | ICD-10-CM | POA: Diagnosis not present

## 2017-10-19 DIAGNOSIS — I480 Paroxysmal atrial fibrillation: Secondary | ICD-10-CM | POA: Diagnosis not present

## 2017-10-19 DIAGNOSIS — I10 Essential (primary) hypertension: Secondary | ICD-10-CM | POA: Diagnosis not present

## 2017-11-09 DIAGNOSIS — I480 Paroxysmal atrial fibrillation: Secondary | ICD-10-CM | POA: Diagnosis not present

## 2017-11-09 DIAGNOSIS — M7989 Other specified soft tissue disorders: Secondary | ICD-10-CM | POA: Diagnosis not present

## 2017-11-11 DIAGNOSIS — I10 Essential (primary) hypertension: Secondary | ICD-10-CM | POA: Diagnosis not present

## 2017-11-11 DIAGNOSIS — I480 Paroxysmal atrial fibrillation: Secondary | ICD-10-CM | POA: Diagnosis not present

## 2017-11-11 DIAGNOSIS — I4892 Unspecified atrial flutter: Secondary | ICD-10-CM | POA: Diagnosis not present

## 2017-11-11 DIAGNOSIS — Z9889 Other specified postprocedural states: Secondary | ICD-10-CM | POA: Diagnosis not present

## 2017-11-12 DIAGNOSIS — R002 Palpitations: Secondary | ICD-10-CM | POA: Diagnosis not present

## 2017-11-16 DIAGNOSIS — G629 Polyneuropathy, unspecified: Secondary | ICD-10-CM | POA: Diagnosis not present

## 2017-11-16 DIAGNOSIS — E1101 Type 2 diabetes mellitus with hyperosmolarity with coma: Secondary | ICD-10-CM | POA: Diagnosis not present

## 2017-11-17 DIAGNOSIS — N312 Flaccid neuropathic bladder, not elsewhere classified: Secondary | ICD-10-CM | POA: Diagnosis not present

## 2017-11-17 DIAGNOSIS — N3000 Acute cystitis without hematuria: Secondary | ICD-10-CM | POA: Diagnosis not present

## 2017-11-25 DIAGNOSIS — Z23 Encounter for immunization: Secondary | ICD-10-CM | POA: Diagnosis not present

## 2017-12-06 ENCOUNTER — Ambulatory Visit: Payer: Medicare Other | Admitting: Podiatry

## 2017-12-11 DIAGNOSIS — R002 Palpitations: Secondary | ICD-10-CM | POA: Diagnosis not present

## 2017-12-13 ENCOUNTER — Encounter: Payer: Self-pay | Admitting: Podiatry

## 2017-12-13 ENCOUNTER — Ambulatory Visit (INDEPENDENT_AMBULATORY_CARE_PROVIDER_SITE_OTHER): Payer: Medicare Other | Admitting: Podiatry

## 2017-12-13 VITALS — BP 145/68

## 2017-12-13 DIAGNOSIS — G629 Polyneuropathy, unspecified: Secondary | ICD-10-CM

## 2017-12-13 DIAGNOSIS — B351 Tinea unguium: Secondary | ICD-10-CM | POA: Diagnosis not present

## 2017-12-15 DIAGNOSIS — M810 Age-related osteoporosis without current pathological fracture: Secondary | ICD-10-CM | POA: Diagnosis not present

## 2017-12-15 DIAGNOSIS — M170 Bilateral primary osteoarthritis of knee: Secondary | ICD-10-CM | POA: Diagnosis not present

## 2017-12-15 DIAGNOSIS — I428 Other cardiomyopathies: Secondary | ICD-10-CM | POA: Diagnosis not present

## 2017-12-15 DIAGNOSIS — M25461 Effusion, right knee: Secondary | ICD-10-CM | POA: Diagnosis not present

## 2017-12-15 DIAGNOSIS — M109 Gout, unspecified: Secondary | ICD-10-CM | POA: Diagnosis not present

## 2017-12-15 DIAGNOSIS — I4892 Unspecified atrial flutter: Secondary | ICD-10-CM | POA: Diagnosis not present

## 2017-12-15 DIAGNOSIS — I480 Paroxysmal atrial fibrillation: Secondary | ICD-10-CM | POA: Diagnosis not present

## 2017-12-15 DIAGNOSIS — M25561 Pain in right knee: Secondary | ICD-10-CM | POA: Diagnosis not present

## 2017-12-15 DIAGNOSIS — M1180 Other specified crystal arthropathies, unspecified site: Secondary | ICD-10-CM | POA: Diagnosis not present

## 2017-12-15 DIAGNOSIS — Z9889 Other specified postprocedural states: Secondary | ICD-10-CM | POA: Diagnosis not present

## 2017-12-19 ENCOUNTER — Observation Stay (HOSPITAL_COMMUNITY)
Admission: EM | Admit: 2017-12-19 | Discharge: 2017-12-21 | Disposition: A | Discharge status: Home / Self Care | Payer: Medicare Other | Attending: Family Medicine | Admitting: Family Medicine

## 2017-12-19 ENCOUNTER — Other Ambulatory Visit: Payer: Self-pay

## 2017-12-19 DIAGNOSIS — Z7951 Long term (current) use of inhaled steroids: Secondary | ICD-10-CM | POA: Insufficient documentation

## 2017-12-19 DIAGNOSIS — I2510 Atherosclerotic heart disease of native coronary artery without angina pectoris: Secondary | ICD-10-CM | POA: Diagnosis not present

## 2017-12-19 DIAGNOSIS — I480 Paroxysmal atrial fibrillation: Secondary | ICD-10-CM | POA: Diagnosis present

## 2017-12-19 DIAGNOSIS — Z87440 Personal history of urinary (tract) infections: Secondary | ICD-10-CM | POA: Insufficient documentation

## 2017-12-19 DIAGNOSIS — E039 Hypothyroidism, unspecified: Secondary | ICD-10-CM | POA: Diagnosis not present

## 2017-12-19 DIAGNOSIS — Z885 Allergy status to narcotic agent status: Secondary | ICD-10-CM | POA: Insufficient documentation

## 2017-12-19 DIAGNOSIS — D631 Anemia in chronic kidney disease: Secondary | ICD-10-CM | POA: Insufficient documentation

## 2017-12-19 DIAGNOSIS — I959 Hypotension, unspecified: Secondary | ICD-10-CM | POA: Diagnosis not present

## 2017-12-19 DIAGNOSIS — D509 Iron deficiency anemia, unspecified: Secondary | ICD-10-CM | POA: Diagnosis present

## 2017-12-19 DIAGNOSIS — Z79899 Other long term (current) drug therapy: Secondary | ICD-10-CM | POA: Diagnosis not present

## 2017-12-19 DIAGNOSIS — M8580 Other specified disorders of bone density and structure, unspecified site: Secondary | ICD-10-CM | POA: Insufficient documentation

## 2017-12-19 DIAGNOSIS — N189 Chronic kidney disease, unspecified: Secondary | ICD-10-CM | POA: Insufficient documentation

## 2017-12-19 DIAGNOSIS — E1122 Type 2 diabetes mellitus with diabetic chronic kidney disease: Secondary | ICD-10-CM | POA: Diagnosis not present

## 2017-12-19 DIAGNOSIS — G4730 Sleep apnea, unspecified: Secondary | ICD-10-CM | POA: Diagnosis not present

## 2017-12-19 DIAGNOSIS — I4510 Unspecified right bundle-branch block: Secondary | ICD-10-CM | POA: Diagnosis not present

## 2017-12-19 DIAGNOSIS — E785 Hyperlipidemia, unspecified: Secondary | ICD-10-CM | POA: Diagnosis not present

## 2017-12-19 DIAGNOSIS — Z8249 Family history of ischemic heart disease and other diseases of the circulatory system: Secondary | ICD-10-CM | POA: Insufficient documentation

## 2017-12-19 DIAGNOSIS — R55 Syncope and collapse: Secondary | ICD-10-CM | POA: Diagnosis not present

## 2017-12-19 DIAGNOSIS — R197 Diarrhea, unspecified: Secondary | ICD-10-CM | POA: Insufficient documentation

## 2017-12-19 DIAGNOSIS — E860 Dehydration: Secondary | ICD-10-CM

## 2017-12-19 DIAGNOSIS — C50211 Malignant neoplasm of upper-inner quadrant of right female breast: Secondary | ICD-10-CM | POA: Diagnosis present

## 2017-12-19 DIAGNOSIS — I10 Essential (primary) hypertension: Secondary | ICD-10-CM | POA: Diagnosis present

## 2017-12-19 DIAGNOSIS — Z880 Allergy status to penicillin: Secondary | ICD-10-CM | POA: Insufficient documentation

## 2017-12-19 DIAGNOSIS — I129 Hypertensive chronic kidney disease with stage 1 through stage 4 chronic kidney disease, or unspecified chronic kidney disease: Secondary | ICD-10-CM | POA: Diagnosis not present

## 2017-12-19 DIAGNOSIS — Z882 Allergy status to sulfonamides status: Secondary | ICD-10-CM | POA: Insufficient documentation

## 2017-12-19 DIAGNOSIS — Z7984 Long term (current) use of oral hypoglycemic drugs: Secondary | ICD-10-CM | POA: Insufficient documentation

## 2017-12-19 DIAGNOSIS — E1169 Type 2 diabetes mellitus with other specified complication: Secondary | ICD-10-CM | POA: Diagnosis present

## 2017-12-19 DIAGNOSIS — Z85828 Personal history of other malignant neoplasm of skin: Secondary | ICD-10-CM | POA: Insufficient documentation

## 2017-12-19 DIAGNOSIS — K529 Noninfective gastroenteritis and colitis, unspecified: Secondary | ICD-10-CM | POA: Diagnosis not present

## 2017-12-19 DIAGNOSIS — R0902 Hypoxemia: Secondary | ICD-10-CM | POA: Diagnosis not present

## 2017-12-19 DIAGNOSIS — Z87892 Personal history of anaphylaxis: Secondary | ICD-10-CM | POA: Insufficient documentation

## 2017-12-19 DIAGNOSIS — J45909 Unspecified asthma, uncomplicated: Secondary | ICD-10-CM | POA: Diagnosis not present

## 2017-12-19 DIAGNOSIS — Z7901 Long term (current) use of anticoagulants: Secondary | ICD-10-CM

## 2017-12-19 DIAGNOSIS — R42 Dizziness and giddiness: Secondary | ICD-10-CM | POA: Diagnosis not present

## 2017-12-19 DIAGNOSIS — E119 Type 2 diabetes mellitus without complications: Secondary | ICD-10-CM

## 2017-12-19 DIAGNOSIS — Z91013 Allergy to seafood: Secondary | ICD-10-CM | POA: Insufficient documentation

## 2017-12-19 DIAGNOSIS — R112 Nausea with vomiting, unspecified: Principal | ICD-10-CM | POA: Diagnosis present

## 2017-12-19 DIAGNOSIS — Z79890 Hormone replacement therapy (postmenopausal): Secondary | ICD-10-CM | POA: Insufficient documentation

## 2017-12-19 DIAGNOSIS — Z79811 Long term (current) use of aromatase inhibitors: Secondary | ICD-10-CM | POA: Insufficient documentation

## 2017-12-19 LAB — I-STAT CHEM 8, ED
BUN: 23 mg/dL (ref 8–23)
CALCIUM ION: 1.11 mmol/L — AB (ref 1.15–1.40)
CHLORIDE: 104 mmol/L (ref 98–111)
Creatinine, Ser: 0.6 mg/dL (ref 0.44–1.00)
Glucose, Bld: 174 mg/dL — ABNORMAL HIGH (ref 70–99)
HEMATOCRIT: 33 % — AB (ref 36.0–46.0)
Hemoglobin: 11.2 g/dL — ABNORMAL LOW (ref 12.0–15.0)
POTASSIUM: 3.4 mmol/L — AB (ref 3.5–5.1)
SODIUM: 138 mmol/L (ref 135–145)
TCO2: 20 mmol/L — ABNORMAL LOW (ref 22–32)

## 2017-12-19 LAB — COMPREHENSIVE METABOLIC PANEL
ALBUMIN: 3.9 g/dL (ref 3.5–5.0)
ALK PHOS: 72 U/L (ref 38–126)
ALT: 62 U/L — ABNORMAL HIGH (ref 0–44)
AST: 54 U/L — AB (ref 15–41)
Anion gap: 9 (ref 5–15)
BILIRUBIN TOTAL: 0.6 mg/dL (ref 0.3–1.2)
BUN: 24 mg/dL — AB (ref 8–23)
CALCIUM: 8.4 mg/dL — AB (ref 8.9–10.3)
CO2: 20 mmol/L — AB (ref 22–32)
Chloride: 107 mmol/L (ref 98–111)
Creatinine, Ser: 0.73 mg/dL (ref 0.44–1.00)
GFR calc Af Amer: >60 mL/min (ref >60)
GFR calc non Af Amer: >60 mL/min (ref >60)
GLUCOSE: 179 mg/dL — AB (ref 70–99)
Potassium: 3.6 mmol/L (ref 3.5–5.1)
SODIUM: 136 mmol/L (ref 135–145)
TOTAL PROTEIN: 6.4 g/dL — AB (ref 6.5–8.1)

## 2017-12-19 LAB — URINALYSIS, ROUTINE W REFLEX MICROSCOPIC
BILIRUBIN URINE: NEGATIVE
GLUCOSE, UA: NEGATIVE mg/dL
KETONES UR: NEGATIVE mg/dL
NITRITE: POSITIVE — AB
PH: 5 (ref 5.0–8.0)
Protein, ur: NEGATIVE mg/dL
SPECIFIC GRAVITY, URINE: 1.015 (ref 1.005–1.030)

## 2017-12-19 LAB — CBC WITH DIFFERENTIAL/PLATELET
Abs Immature Granulocytes: 0.24 10*3/uL — ABNORMAL HIGH (ref 0.00–0.07)
BASOS ABS: 0 10*3/uL (ref 0.0–0.1)
Basophils Relative: 0 %
EOS ABS: 0 10*3/uL (ref 0.0–0.5)
Eosinophils Relative: 0 %
HEMATOCRIT: 36.9 % (ref 36.0–46.0)
HEMOGLOBIN: 11.8 g/dL — AB (ref 12.0–15.0)
IMMATURE GRANULOCYTES: 3 %
LYMPHS ABS: 0.2 10*3/uL — AB (ref 0.7–4.0)
LYMPHS PCT: 2 %
MCH: 28.9 pg (ref 26.0–34.0)
MCHC: 32 g/dL (ref 30.0–36.0)
MCV: 90.2 fL (ref 80.0–100.0)
MONOS PCT: 3 %
Monocytes Absolute: 0.3 10*3/uL (ref 0.1–1.0)
NEUTROS PCT: 92 %
Neutro Abs: 8.6 10*3/uL — ABNORMAL HIGH (ref 1.7–7.7)
Platelets: 192 10*3/uL (ref 150–400)
RBC: 4.09 MIL/uL (ref 3.87–5.11)
RDW: 14.6 % (ref 11.5–15.5)
WBC: 9.4 10*3/uL (ref 4.0–10.5)
nRBC: 0 % (ref 0.0–0.2)

## 2017-12-19 LAB — I-STAT CG4 LACTIC ACID, ED: Lactic Acid, Venous: 2.46 mmol/L (ref 0.5–1.9)

## 2017-12-19 LAB — LIPASE, BLOOD: Lipase: 17 U/L (ref 11–51)

## 2017-12-19 LAB — PROTIME-INR
INR: 1.19
Prothrombin Time: 15 seconds (ref 11.4–15.2)

## 2017-12-19 MED ORDER — SODIUM CHLORIDE 0.9 % IV BOLUS (SEPSIS)
1000.0000 mL | Freq: Once | INTRAVENOUS | Status: AC
  Administered 2017-12-19: 1000 mL via INTRAVENOUS

## 2017-12-19 MED ORDER — SODIUM CHLORIDE 0.9 % IV SOLN: 1000.0000 mL | INTRAVENOUS | Status: DC

## 2017-12-19 MED ORDER — PROMETHAZINE HCL 25 MG/ML IJ SOLN
12.5000 mg | Freq: Once | INTRAMUSCULAR | Status: AC
  Administered 2017-12-19: 12.5 mg via INTRAVENOUS
  Filled 2017-12-19: qty 1

## 2017-12-19 NOTE — ED Triage Notes (Addendum)
Pt is from home with husband. Pt reports N/VD beginning Sunday progressively getting worse. Pt reports generalized weakness/fatigue. Pt received 8mg  Zofran IV

## 2017-12-19 NOTE — ED Notes (Signed)
I Stat Lactic: 2.46 RN and Provider notified

## 2017-12-19 NOTE — ED Notes (Signed)
Bed: WA07 Expected date:  Expected time:  Means of arrival:  Comments: EMS 77yo diarrhea

## 2017-12-19 NOTE — ED Notes (Signed)
Pt too lethargic for complete orthostatics. MD aware

## 2017-12-19 NOTE — ED Provider Notes (Addendum)
[facility] DEPT Provider Note   CSN: [id number] Arrival date & time: 12/19/17  1902     History   Chief Complaint Chief Complaint  Patient presents with  . Weakness  . Nausea    HPI Patty [name] is a 77 y.o. female.  HPI Patient reports he started having some vomiting and diarrhea 3 days ago.  She however felt better yesterday and thought the symptoms were resolving.  She had started eating again.  Day today, she got much worse quite quickly.  She started having repeat episodes of vomiting and diarrhea.  Estimates about 6 or more episodes of vomiting and frequent diarrhea as well with incontinence of stool.  He started to become extremely weak and she reports she did not pass out but felt like she was close.  Family reports when they came to check on her she was lying on the floor due to feeling so weak.  No injury or fall however.  She did not have fever that she is aware of.  She reports she still feels extremely nauseated and continues to have dry heaves.  She denies she has any abdominal pain. Past Medical History:  Diagnosis Date  . Arthritis   . Asthma   . 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 Chronic kidney disease    recurring UTI  . Complication of anesthesia    "morphine made me stop breathing"  . HCAP (healthcare-associated pneumonia) 12/2013   Archie Endo 01/18/2014  . History of skin cancer   . History of transfusion   . Hypertension   . Hypothyroidism   . Left atrial enlargement   . 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 Nocturia   . Persistent atrial fibrillation   . Shortness of breath dyspnea    "at times"  . Skin cancer   . Sleep apnea    uses c-pap  . Type II diabetes mellitus [facility])     Patient Active Problem List   Diagnosis Date Noted  . Iron deficiency anemia 12/15/2016  . Weight gain 04/08/2016  . RBBB 07/30/2015  . Essential hypertension 07/30/2015  . Osteopenia  07/11/2015  . Cancer of right breast ([place]) 05/27/2015  . Breast cancer of upper-inner quadrant of right female breast ([place]) 04/24/2015  . Nausea vomiting and diarrhea 03/23/2015  . Controlled type 2 diabetes mellitus without complication, without long-term current use of insulin ([place]) 03/23/2015  . Dyslipidemia associated with type 2 diabetes mellitus ([place]) 03/23/2015  . 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 Long term current use of anticoagulant therapy 04/10/2014  . UTI (urinary tract infection) 04/10/2014  . Leukocytosis 03/14/2014  . Anemia in chronic illness 12/28/2013  . 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, face, and neck ([place]) 12/26/2013  . Hypothyroidism 06/15/2010  . Paroxysmal atrial fibrillation ([place]) 05/28/2010    Past Surgical History:  Procedure Laterality Date  . ABDOMINAL HYSTERECTOMY  1972   partial  . APPENDECTOMY  1953  . ATRIAL FIBRILLATION ABLATION  08/19/2015   PVI with CTI ablation by Dr Rayann Heman  . BACK SURGERY     x2  . BREAST SURGERY     Right mastectomy  . CARDIAC CATHETERIZATION  10/10/2008   Nonobstructive CAD  . CARDIOVERSION  04/13/2011   Procedure: CARDIOVERSION;  Surgeon: Laverda Page, MD;  Location: [facility];  Service: Cardiovascular;  Laterality: N/A;  . CARDIOVERSION N/A 06/18/2015   Procedure: CARDIOVERSION;  Surgeon: Ulice Dash  Einar Gip, MD;  Location: [facility] ENDOSCOPY;  Service: Cardiovascular;  Laterality: N/A;  . CARDIOVERSION N/A 10/26/2016   Procedure: CARDIOVERSION;  Surgeon: Adrian Prows, MD;  Location: [facility];  Service: Cardiovascular;  Laterality: N/A;  . CARDIOVERSION N/A 10/11/2017   Procedure: CARDIOVERSION;  Surgeon: Nigel Mormon, MD;  Location: [facility] ENDOSCOPY;  Service: Cardiovascular;  Laterality: N/A;  . CATARACT EXTRACTION W/ INTRAOCULAR LENS  IMPLANT, BILATERAL Bilateral   . CHOLECYSTECTOMY  1993  . ELECTROPHYSIOLOGIC STUDY N/A 08/19/2015   Procedure: Atrial Fibrillation Ablation;  Surgeon: Thompson Grayer, MD;   Location: [facility] CV LAB;  Service: Cardiovascular;  Laterality: N/A;  . FOOT SURGERY    . IR REMOVAL TUN ACCESS W/ PORT W/O FL MOD SED  04/01/2017  . LUMBAR LAMINECTOMY    . MASS BIOPSY Left 12/07/2013   Procedure: EXCISIONAL BIOPSY LEFT NECK MASS ;  Surgeon: Jerrell Belfast, MD;  Location: [facility];  Service: ENT;  Laterality: Left;  Marland Kitchen MASTECTOMY Right 2017   malignant  . PORTACATH PLACEMENT  12/2013   Archie Endo 01/04/2014  . PUBOVAGINAL SLING    . SKIN TAG REMOVAL  2012   leg  . TEE WITHOUT CARDIOVERSION N/A 08/19/2015   Procedure: TRANSESOPHAGEAL ECHOCARDIOGRAM (TEE);  Surgeon: Larey Dresser, MD;  Location: [facility];  Service: Cardiovascular;  Laterality: N/A;  . TONSILLECTOMY    . TOTAL MASTECTOMY Right 05/27/2015   Procedure: RIGHT TOTAL MASTECTOMY;  Surgeon: Fanny Skates, MD;  Location: [facility];  Service: General;  Laterality: Right;     OB History    Gravida  2   Para  2   Term  2   Preterm      AB      Living  2     SAB      TAB      Ectopic      Multiple      Live Births               Home Medications    Prior to Admission medications   Medication Sig Start Date End Date Taking? Authorizing Provider  acetaminophen (TYLENOL) 500 MG tablet Take 1,000 mg by mouth every 6 (six) hours as needed for moderate pain, fever or headache.   Yes [provider]  albuterol (PROVENTIL HFA;VENTOLIN HFA) 108 (90 Base) MCG/ACT inhaler Inhale into the lungs as directed.   Yes [provider]  anastrozole (ARIMIDEX) 1 MG tablet TAKE ONE TABLET BY MOUTH EVERY DAY 08/03/17  Yes Gorsuch, Ni, MD  Cholecalciferol (VITAMIN D) 2000 UNITS tablet Take 2,000 Units by mouth daily with lunch.   Yes [provider]  Cyanocobalamin (B-12) 2500 MCG TABS Take 2,500 mcg by mouth daily.   Yes [provider]  diltiazem (TIAZAC) 120 MG 24 hr capsule Take 120 mg by mouth daily. 10/11/16  Yes [provider]  doxycycline (VIBRAMYCIN) 100 MG  capsule Take 100 mg by mouth at bedtime.  10/06/16  Yes [provider]  Fluticasone-Salmeterol (ADVAIR) 250-50 MCG/DOSE AEPB Inhale 2 puffs into the lungs every 12 (twelve) hours.    Yes [provider]  gabapentin (NEURONTIN) 300 MG capsule Take 300 mg by mouth at bedtime.    Yes [provider]  levothyroxine (SYNTHROID, LEVOTHROID) 200 MCG tablet Take 200 mcg by mouth daily before breakfast.   Yes [provider]  Magnesium Oxide 500 MG TABS Take 500 mg by mouth 2 (two) times daily.    Yes [provider]  metFORMIN (  GLUCOPHAGE-XR) 500 MG 24 hr tablet Take 500 mg by mouth 2 (two) times daily with a meal.   Yes [provider]  montelukast (SINGULAIR) 10 MG tablet Take 10 mg by mouth daily.   Yes [provider]  omega-3 acid ethyl esters (LOVAZA) 1 g capsule Take 1 g by mouth 2 (two) times daily.   Yes [provider]  pravastatin (PRAVACHOL) 20 MG tablet Take 10 mg by mouth See admin instructions. Take 0.5 tablet (10 mg) by mouth in the evening on Mondays through Thursdays.   Yes [provider]  PROLIA 60 MG/ML SOSY injection Inject 60 mg into the vein every 6 (six) months. 07/18/17  Yes [provider]  rivaroxaban (XARELTO) 20 MG TABS tablet Take 20 mg by mouth daily with supper.   Yes [provider]  triamterene-hydrochlorothiazide (MAXZIDE-25) 37.5-25 MG tablet Take 1 tablet by mouth daily as needed (for fluid retention/swelling.).  07/29/16  Yes [provider]    Family History Family History  Problem Relation Age of Onset  . Stroke Father 34  . Heart attack Father 87  . Hypertension Father   . Heart disease Mother   . Arthritis Mother   . Breast cancer Mother 23  . Heart failure Mother   . Heart attack Brother   . Hypertension Brother   . Diabetes Sister     Social History Social History   Tobacco Use  . Smoking status: Never Smoker  . Smokeless tobacco: Never Used    Substance Use Topics  . Alcohol use: No    Alcohol/week: 0.0 standard drinks  . Drug use: No     Allergies   Morphine and related; Multaq [dronedarone]; Demerol; Oysters [shellfish allergy]; Penicillins; and Sulfa drugs cross reactors   Review of Systems Review of Systems  10 Systems reviewed and are negative for acute change except as noted in the HPI.   Physical Exam Updated Vital Signs BP (!) 111/52   Pulse 91   Temp 98.3 F (36.8 C) (Oral)   Resp (!) 29   SpO2 93%   Physical Exam  Constitutional: She is oriented to person, place, and time.  Patient is very fatigued and ill in appearance.  No respiratory distress.  Mental status is clear.  HENT:  Head: Normocephalic and atraumatic.  His membranes are very dry.  Airway is patent.  Eyes: Pupils are equal, round, and reactive to light. EOM are normal.  Neck: Neck supple.  Cardiovascular:  Borderline tachycardia.  No gross rub murmur gallop.  Pulmonary/Chest: Effort normal and breath sounds normal.  Abdominal: Soft. She exhibits no distension. There is no tenderness. There is no guarding.  Musculoskeletal: Normal range of motion. She exhibits no edema or tenderness.  Neurological: She is alert and oriented to person, place, and time. No cranial nerve deficit. She exhibits normal muscle tone. Coordination normal.  Skin: Skin is warm and dry. There is pallor.  Psychiatric: She has a normal mood and affect.     ED Treatments / Results  Labs (all labs ordered are listed, but only abnormal results are displayed) Labs Reviewed  COMPREHENSIVE METABOLIC PANEL - Abnormal; Notable for the following components:      Result Value   CO2 20 (*)    Glucose, Bld 179 (*)    BUN 24 (*)    Calcium 8.4 (*)    Total Protein 6.4 (*)    AST 54 (*)    ALT 62 (*)    All  other components within normal limits  CBC WITH DIFFERENTIAL/PLATELET - Abnormal; Notable for the following components:   Hemoglobin 11.8 (*)    Neutro Abs 8.6 (*)     Lymphs Abs 0.2 (*)    Abs Immature Granulocytes 0.24 (*)    All other components within normal limits  URINALYSIS, ROUTINE W REFLEX MICROSCOPIC - Abnormal; Notable for the following components:   APPearance HAZY (*)    Hgb urine dipstick SMALL (*)    Nitrite POSITIVE (*)    Leukocytes, UA TRACE (*)    Bacteria, UA RARE (*)    All other components within normal limits  I-STAT CHEM 8, ED - Abnormal; Notable for the following components:   Potassium 3.4 (*)    Glucose, Bld 174 (*)    Calcium, Ion 1.11 (*)    TCO2 20 (*)    Hemoglobin 11.2 (*)    HCT 33.0 (*)    All other components within normal limits  I-STAT CG4 LACTIC ACID, ED - Abnormal; Notable for the following components:   Lactic Acid, Venous 2.46 (*)    All other components within normal limits  LIPASE, BLOOD  PROTIME-INR  I-STAT CG4 LACTIC ACID, ED    EKG EKG Interpretation  Date/Time:  Monday December 19 2017 20:57:14 EDT Ventricular Rate:  94 PR Interval:    QRS Duration: 146 QT Interval:  461 QTC Calculation: 577 R Axis:   -82 Text Interpretation:  Sinus rhythm Right bundle branch block Inferior infarct, old Probable anterior infarct, age indeterminate old RBBB. no sig change from previous Confirmed by Charlesetta Shanks [phone]) on 12/19/2017 9:04:39 PM   Radiology No results found.  Procedures Procedures (including critical care time)  Medications Ordered in ED Medications  sodium chloride 0.9 % bolus 1,000 mL (0 mLs Intravenous Stopped 12/19/17 2209)    Followed by  0.9 %  sodium chloride infusion (has no administration in time range)  0.9 %  sodium chloride infusion (has no administration in time range)  sodium chloride 0.9 % bolus 500 mL (has no administration in time range)  promethazine (PHENERGAN) injection 12.5 mg (12.5 mg Intravenous Given 12/19/17 2138)     Initial Impression / Assessment and Plan / ED Course  I have reviewed the triage vital signs and the nursing notes.  Pertinent labs &  imaging results that were available during my care of the patient were reviewed by me and considered in my medical decision making (see chart for details).  Clinical Course as of Dec 20 16  Tue Dec 20, 2017  0009 Recheck: Patient continues to have very ill appearance.  Phenergan has helped.  She is not actively having dry heaves.   [MP]  0011 Consult to medicine ordered.   [MP]  0018 Consult: Reviewed to Dr. Hal Hope for admission.   [MP]    Clinical Course User Index [MP] Charlesetta Shanks, MD   Patient presents with vomiting and diarrheal illness.  She is very dehydrated in appearance clinically.  She had 1 L of fluids and Zofran 8 mg IV by EMS prior to arrival.  On arrival, patient continues to be ill in appearance with dry heaves.  Her mental status is appropriate.  We will continue hydration.  She does not have abdominal pain to suggest surgical abdomen.  After 2-1/2 L of rehydration, patient continues to have very ill appearance.  Vomiting is somewhat controlled with Phenergan.  Will plan for admission.  Final Clinical Impressions(s) / ED Diagnoses   Final diagnoses:  Dehydration  Gastroenteritis    ED Discharge Orders    None       Charlesetta Shanks, MD 12/20/17 4388    Charlesetta Shanks, MD 12/20/17 [phone]

## 2017-12-20 ENCOUNTER — Encounter (HOSPITAL_COMMUNITY): Payer: Self-pay | Admitting: Internal Medicine

## 2017-12-20 DIAGNOSIS — E119 Type 2 diabetes mellitus without complications: Secondary | ICD-10-CM | POA: Diagnosis not present

## 2017-12-20 DIAGNOSIS — R112 Nausea with vomiting, unspecified: Secondary | ICD-10-CM | POA: Diagnosis not present

## 2017-12-20 DIAGNOSIS — E860 Dehydration: Secondary | ICD-10-CM

## 2017-12-20 DIAGNOSIS — R197 Diarrhea, unspecified: Secondary | ICD-10-CM | POA: Diagnosis not present

## 2017-12-20 DIAGNOSIS — I480 Paroxysmal atrial fibrillation: Secondary | ICD-10-CM

## 2017-12-20 DIAGNOSIS — I10 Essential (primary) hypertension: Secondary | ICD-10-CM | POA: Diagnosis not present

## 2017-12-20 LAB — BASIC METABOLIC PANEL
Anion gap: 9 (ref 5–15)
BUN: 20 mg/dL (ref 8–23)
CHLORIDE: 108 mmol/L (ref 98–111)
CO2: 21 mmol/L — AB (ref 22–32)
Calcium: 7.6 mg/dL — ABNORMAL LOW (ref 8.9–10.3)
Creatinine, Ser: 0.61 mg/dL (ref 0.44–1.00)
GFR calc non Af Amer: >60 mL/min (ref >60)
GLUCOSE: 141 mg/dL — AB (ref 70–99)
Potassium: 3.2 mmol/L — ABNORMAL LOW (ref 3.5–5.1)
Sodium: 138 mmol/L (ref 135–145)

## 2017-12-20 LAB — GLUCOSE, CAPILLARY
GLUCOSE-CAPILLARY: 122 mg/dL — AB (ref 70–99)
GLUCOSE-CAPILLARY: 133 mg/dL — AB (ref 70–99)
GLUCOSE-CAPILLARY: 114 mg/dL — AB (ref 70–99)
Glucose-Capillary: 122 mg/dL — ABNORMAL HIGH (ref 70–99)

## 2017-12-20 LAB — CBC
HEMATOCRIT: 44.7 % (ref 36.0–46.0)
Hemoglobin: 14.5 g/dL (ref 12.0–15.0)
MCH: 28.7 pg (ref 26.0–34.0)
MCHC: 32.4 g/dL (ref 30.0–36.0)
MCV: 88.5 fL (ref 80.0–100.0)
NRBC: 0 % (ref 0.0–0.2)
Platelets: 106 10*3/uL — ABNORMAL LOW (ref 150–400)
RBC: 5.05 MIL/uL (ref 3.87–5.11)
RDW: 15.2 % (ref 11.5–15.5)
WBC: 6.5 10*3/uL (ref 4.0–10.5)

## 2017-12-20 LAB — HEPATIC FUNCTION PANEL
ALT: 50 U/L — ABNORMAL HIGH (ref 0–44)
AST: 39 U/L (ref 15–41)
Albumin: 3.2 g/dL — ABNORMAL LOW (ref 3.5–5.0)
Alkaline Phosphatase: 55 U/L (ref 38–126)
BILIRUBIN DIRECT: 0.2 mg/dL (ref 0.0–0.2)
BILIRUBIN INDIRECT: 0.6 mg/dL (ref 0.3–0.9)
BILIRUBIN TOTAL: 0.8 mg/dL (ref 0.3–1.2)
Total Protein: 5.4 g/dL — ABNORMAL LOW (ref 6.5–8.1)

## 2017-12-20 LAB — I-STAT CG4 LACTIC ACID, ED: LACTIC ACID, VENOUS: 1.83 mmol/L (ref 0.5–1.9)

## 2017-12-20 MED ORDER — ONDANSETRON HCL 4 MG PO TABS: 4.0000 mg | ORAL_TABLET | Freq: Four times a day (QID) | ORAL | Status: DC | PRN

## 2017-12-20 MED ORDER — DILTIAZEM HCL ER COATED BEADS 120 MG PO CP24
120.0000 mg | ORAL_CAPSULE | Freq: Every day | ORAL | Status: DC
  Filled 2017-12-20 (×2): qty 1

## 2017-12-20 MED ORDER — MONTELUKAST SODIUM 10 MG PO TABS
10.0000 mg | ORAL_TABLET | Freq: Every day | ORAL | Status: DC
  Administered 2017-12-20 – 2017-12-21 (×2): 10 mg via ORAL
  Filled 2017-12-20 (×2): qty 1

## 2017-12-20 MED ORDER — OMEGA-3-ACID ETHYL ESTERS 1 G PO CAPS
1.0000 g | ORAL_CAPSULE | Freq: Two times a day (BID) | ORAL | Status: DC
  Administered 2017-12-20 – 2017-12-21 (×3): 1 g via ORAL
  Filled 2017-12-20 (×3): qty 1

## 2017-12-20 MED ORDER — ALBUTEROL SULFATE HFA 108 (90 BASE) MCG/ACT IN AERS: 2.0000 | INHALATION_SPRAY | RESPIRATORY_TRACT | Status: DC | PRN

## 2017-12-20 MED ORDER — MOMETASONE FURO-FORMOTEROL FUM 200-5 MCG/ACT IN AERO
2.0000 | INHALATION_SPRAY | Freq: Two times a day (BID) | RESPIRATORY_TRACT | Status: DC
  Administered 2017-12-20 – 2017-12-21 (×3): 2 via RESPIRATORY_TRACT
  Filled 2017-12-20: qty 8.8

## 2017-12-20 MED ORDER — ANASTROZOLE 1 MG PO TABS
1.0000 mg | ORAL_TABLET | Freq: Every day | ORAL | Status: DC
  Administered 2017-12-20 – 2017-12-21 (×2): 1 mg via ORAL
  Filled 2017-12-20 (×2): qty 1

## 2017-12-20 MED ORDER — ACETAMINOPHEN 650 MG RE SUPP: 650.0000 mg | Freq: Four times a day (QID) | RECTAL | Status: DC | PRN

## 2017-12-20 MED ORDER — VITAMIN D 1000 UNITS PO TABS
2000.0000 [IU] | ORAL_TABLET | Freq: Every day | ORAL | Status: DC
  Administered 2017-12-20 – 2017-12-21 (×2): 2000 [IU] via ORAL
  Filled 2017-12-20 (×2): qty 2

## 2017-12-20 MED ORDER — SODIUM CHLORIDE 0.9 % IV SOLN: Freq: Once | INTRAVENOUS | Status: DC

## 2017-12-20 MED ORDER — ACETAMINOPHEN 325 MG PO TABS: 650.0000 mg | ORAL_TABLET | Freq: Four times a day (QID) | ORAL | Status: DC | PRN

## 2017-12-20 MED ORDER — VITAMIN B-12 1000 MCG PO TABS
2500.0000 ug | ORAL_TABLET | Freq: Every day | ORAL | Status: DC
  Administered 2017-12-20 – 2017-12-21 (×2): 2500 ug via ORAL
  Filled 2017-12-20 (×2): qty 3

## 2017-12-20 MED ORDER — INSULIN ASPART 100 UNIT/ML ~~LOC~~ SOLN
0.0000 [IU] | Freq: Three times a day (TID) | SUBCUTANEOUS | Status: DC
  Administered 2017-12-20 (×3): 1 [IU] via SUBCUTANEOUS

## 2017-12-20 MED ORDER — SODIUM CHLORIDE 0.9 % IV SOLN
INTRAVENOUS | Status: AC
  Administered 2017-12-20 (×2): via INTRAVENOUS

## 2017-12-20 MED ORDER — SODIUM CHLORIDE 0.9 % IV BOLUS
500.0000 mL | Freq: Once | INTRAVENOUS | Status: AC
  Administered 2017-12-20: 500 mL via INTRAVENOUS

## 2017-12-20 MED ORDER — POTASSIUM CHLORIDE CRYS ER 20 MEQ PO TBCR
40.0000 meq | EXTENDED_RELEASE_TABLET | ORAL | Status: AC
  Administered 2017-12-20 (×2): 40 meq via ORAL
  Filled 2017-12-20 (×2): qty 2

## 2017-12-20 MED ORDER — RIVAROXABAN 20 MG PO TABS
20.0000 mg | ORAL_TABLET | Freq: Every day | ORAL | Status: DC
  Administered 2017-12-20: 20 mg via ORAL
  Filled 2017-12-20: qty 1

## 2017-12-20 MED ORDER — LEVOTHYROXINE SODIUM 100 MCG PO TABS
200.0000 ug | ORAL_TABLET | Freq: Every day | ORAL | Status: DC
  Administered 2017-12-20 – 2017-12-21 (×2): 200 ug via ORAL
  Filled 2017-12-20 (×2): qty 2

## 2017-12-20 MED ORDER — GABAPENTIN 300 MG PO CAPS
300.0000 mg | ORAL_CAPSULE | Freq: Every day | ORAL | Status: DC
  Administered 2017-12-20: 300 mg via ORAL
  Filled 2017-12-20: qty 1

## 2017-12-20 MED ORDER — ONDANSETRON HCL 4 MG/2ML IJ SOLN
4.0000 mg | Freq: Four times a day (QID) | INTRAMUSCULAR | Status: DC | PRN
  Administered 2017-12-20: 4 mg via INTRAVENOUS
  Filled 2017-12-20: qty 2

## 2017-12-20 MED ORDER — ALBUTEROL SULFATE (2.5 MG/3ML) 0.083% IN NEBU: 2.5000 mg | INHALATION_SOLUTION | RESPIRATORY_TRACT | Status: DC | PRN

## 2017-12-20 MED ORDER — MAGNESIUM OXIDE 400 (241.3 MG) MG PO TABS
400.0000 mg | ORAL_TABLET | Freq: Two times a day (BID) | ORAL | Status: DC
  Administered 2017-12-20 – 2017-12-21 (×4): 400 mg via ORAL
  Filled 2017-12-20 (×4): qty 1

## 2017-12-20 MED ORDER — PRAVASTATIN SODIUM 20 MG PO TABS
10.0000 mg | ORAL_TABLET | Freq: Every day | ORAL | Status: DC
  Administered 2017-12-20: 10 mg via ORAL
  Filled 2017-12-20: qty 1

## 2017-12-20 NOTE — Care Management Obs Status (Signed)
[facility] NOTIFICATION   Patient Details  Name: Patty [name] MRN: [id number] Date of Birth: 07-Aug-1940   Medicare Observation Status Notification Given:  Yes    Dessa Phi, RN 12/20/2017, 4:22 PM

## 2017-12-20 NOTE — Progress Notes (Signed)
[facility]  Patient admitted after midnight, see H&P for further details.  Patient seen, chart reviewed and plan of care discussed.  77 year old female with medical history of asthma, hypertension, PAF, hypothyroidism, diabetes mellitus type 2, presented to the emergency department complaining of nausea, vomiting and diarrhea.  Patient reported no new food however 3 days prior to symptoms started and was visiting [place] in [place].  Upon ED evaluation patient was found to be hypotensive, mild elevated BUN and decreased CO2.  Patient was admitted with working diagnosis of dehydration and work-up for diarrhea.  Patient this morning had to more episodes of diarrhea, I have sent C. difficile studies and GI PCR to rule out Giardia given patient visiting like.  Continue symptomatic treatment.  If GI panel and significant negative treat with Imodium and discharged home in a.m.  Chipper Oman, MD

## 2017-12-20 NOTE — ED Notes (Signed)
ED TO INPATIENT HANDOFF REPORT  Name/Age/Gender Patty [name] 77 y.o. female  Code Status Code Status History    Date Active Date Inactive Code Status Order ID Comments User Context   08/19/2015 1552 08/20/2015 1325 Full Code [id number]  Thompson Grayer, MD Inpatient   04/27/2015 0055 04/30/2015 1347 Full Code [id number]  Theressa Millard, MD ED   03/23/2015 1340 03/26/2015 1700 Full Code [id number]  Robbie Lis, MD ED   04/09/2014 2224 04/12/2014 1347 Partial Code [id number]  Shanda Howells, MD ED   01/18/2014 2133 02/01/2014 1854 Full Code [id number]  Theressa Millard, MD Inpatient   01/04/2014 1506 01/05/2014 0354 Full Code [id number]  Azzie Roup, MD [facility]      Home/SNF/Other Home  Chief Complaint weakness   Level of Care/Admitting Diagnosis ED Disposition    ED Disposition Condition [facility] Area: [facility] [[id number]]  Level of Care: Med-Surg [16]  Diagnosis: Nausea vomiting and diarrhea [237628]  Admitting Physician: Rise Patience [phone]  Attending Physician: Rise Patience [phone]  PT Class (Do Not Modify): Observation [104]  PT Acc Code (Do Not Modify): Observation [10022]       Medical History Past Medical History:  Diagnosis Date  . Arthritis   . Asthma   . 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 Chronic kidney disease    recurring UTI  . Complication of anesthesia    "morphine made me stop breathing"  . HCAP (healthcare-associated pneumonia) 12/2013   Archie Endo 01/18/2014  . History of skin cancer   . History of transfusion   . Hypertension   . Hypothyroidism   . Left atrial enlargement   . 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 Nocturia   . Persistent atrial fibrillation   . Shortness of breath dyspnea    "at times"  . Skin cancer   . Sleep apnea    uses c-pap  . Type II diabetes mellitus ([facility])     Allergies Allergies  Allergen Reactions  . Morphine And  Related Anaphylaxis and Other (See Comments)    Pt states she stopped breathing post op- "went into resp. arrest"  . Multaq [Dronedarone] Other (See Comments)    Reaction:  Blood in urine and elevated liver enzymes.   . Demerol Nausea And Vomiting  . Oysters [Shellfish Allergy] Rash    & [facility]  . Penicillins Itching and Rash    Has patient had a PCN reaction causing immediate rash, facial/tongue/throat swelling, SOB or lightheadedness with hypotension: No Has patient had a PCN reaction causing severe rash involving mucus membranes or skin necrosis: Yes Has patient had a PCN reaction that required hospitalization No Has patient had a PCN reaction occurring within the last 10 years: No If all of the above answers are "NO", then may proceed with Cephalosporin use.   [facility] Cross Reactors Rash    IV Location/Drains/Wounds Patient Lines/Drains/Airways Status   Active Line/Drains/Airways    Name:   Placement date:   Placement time:   Site:   Days:   Peripheral IV 10/20/16 Left Antecubital   10/20/16    1102    Antecubital   426   Peripheral IV 10/26/16 Right Forearm   10/26/16    1315    Forearm   420   Peripheral IV 12/19/17 Left Forearm   12/19/17    -    Forearm  1          Labs/Imaging Results for orders placed or performed during the hospital encounter of 12/19/17 (from the past 48 hour(s))  Comprehensive metabolic panel     Status: Abnormal   Collection Time: 12/19/17  7:45 PM  Result Value Ref Range   Sodium 136 135 - 145 mmol/L   Potassium 3.6 3.5 - 5.1 mmol/L   Chloride 107 98 - 111 mmol/L   CO2 20 (L) 22 - 32 mmol/L   Glucose, Bld 179 (H) 70 - 99 mg/dL   BUN 24 (H) 8 - 23 mg/dL   Creatinine, Ser 0.73 0.44 - 1.00 mg/dL   Calcium 8.4 (L) 8.9 - 10.3 mg/dL   Total Protein 6.4 (L) 6.5 - 8.1 g/dL   Albumin 3.9 3.5 - 5.0 g/dL   AST 54 (H) 15 - 41 U/L   ALT 62 (H) 0 - 44 U/L   Alkaline Phosphatase 72 38 - 126 U/L   Total Bilirubin 0.6 0.3 - 1.2 mg/dL   GFR calc non  Af Amer >60 >60 mL/min   GFR calc Af Amer >60 >60 mL/min    Comment: (NOTE) The eGFR has been calculated using the CKD EPI equation. This calculation has not been validated in all clinical situations. eGFR's persistently <60 mL/min signify possible Chronic Kidney Disease.    Anion gap 9 5 - 15    Comment: Performed at [facility], [place] [address], [place], [place] [address]  Lipase, blood     Status: None   Collection Time: 12/19/17  7:45 PM  Result Value Ref Range   Lipase 17 11 - 51 U/L    Comment: Performed at [facility], [place] [address] [place], [place] [address]  CBC with Differential     Status: Abnormal   Collection Time: 12/19/17  7:45 PM  Result Value Ref Range   WBC 9.4 4.0 - 10.5 K/uL   RBC 4.09 3.87 - 5.11 MIL/uL   Hemoglobin 11.8 (L) 12.0 - 15.0 g/dL   HCT 36.9 36.0 - 46.0 %   MCV 90.2 80.0 - 100.0 fL   MCH 28.9 26.0 - 34.0 pg   MCHC 32.0 30.0 - 36.0 g/dL   RDW 14.6 11.5 - 15.5 %   Platelets 192 150 - 400 K/uL   nRBC 0.0 0.0 - 0.2 %   Neutrophils Relative % 92 %   Neutro Abs 8.6 (H) 1.7 - 7.7 K/uL   Lymphocytes Relative 2 %   Lymphs Abs 0.2 (L) 0.7 - 4.0 K/uL   Monocytes Relative 3 %   Monocytes Absolute 0.3 0.1 - 1.0 K/uL   Eosinophils Relative 0 %   Eosinophils Absolute 0.0 0.0 - 0.5 K/uL   Basophils Relative 0 %   Basophils Absolute 0.0 0.0 - 0.1 K/uL   Immature Granulocytes 3 %   Abs Immature Granulocytes 0.24 (H) 0.00 - 0.07 K/uL    Comment: Performed at [facility], [place] [address] [place], [place] [address]  Protime-INR     Status: None   Collection Time: 12/19/17  7:45 PM  Result Value Ref Range   Prothrombin Time 15.0 11.4 - 15.2 seconds   INR 1.19     Comment: Performed at [facility], [place] [address], [place],  [address]  Urinalysis, Routine w reflex microscopic     Status: Abnormal   Collection Time: 12/19/17  9:01 PM  Result Value Ref Range   Color, Urine  YELLOW YELLOW   APPearance HAZY (A) CLEAR   Specific Gravity, Urine 1.015 1.005 - 1.030   pH 5.0 5.0 - 8.0   Glucose, UA NEGATIVE NEGATIVE mg/dL   Hgb urine dipstick SMALL (A) NEGATIVE   Bilirubin Urine NEGATIVE NEGATIVE   Ketones, ur NEGATIVE NEGATIVE mg/dL   Protein, ur NEGATIVE NEGATIVE mg/dL   Nitrite POSITIVE (A) NEGATIVE   Leukocytes, UA TRACE (A) NEGATIVE   RBC / HPF 0-5 0 - 5 RBC/hpf   WBC, UA 0-5 0 - 5 WBC/hpf   Bacteria, UA RARE (A) NONE SEEN   Squamous Epithelial / LPF 0-5 0 - 5   Mucus PRESENT     Comment: Performed at [facility], [place] [address] [place], [place] [address]  I-stat Chem 8, ED     Status: Abnormal   Collection Time: 12/19/17  9:21 PM  Result Value Ref Range   Sodium 138 135 - 145 mmol/L   Potassium 3.4 (L) 3.5 - 5.1 mmol/L   Chloride 104 98 - 111 mmol/L   BUN 23 8 - 23 mg/dL   Creatinine, Ser 0.60 0.44 - 1.00 mg/dL   Glucose, Bld 174 (H) 70 - 99 mg/dL   Calcium, Ion 1.11 (L) 1.15 - 1.40 mmol/L   TCO2 20 (L) 22 - 32 mmol/L   Hemoglobin 11.2 (L) 12.0 - 15.0 g/dL   HCT 33.0 (L) 36.0 - 46.0 %  I-Stat CG4 Lactic Acid, ED     Status: Abnormal   Collection Time: 12/19/17  9:21 PM  Result Value Ref Range   Lactic Acid, Venous 2.46 (HH) 0.5 - 1.9 mmol/L   Comment NOTIFIED PHYSICIAN   I-Stat CG4 Lactic Acid, ED     Status: None   Collection Time: 12/20/17 12:20 AM  Result Value Ref Range   Lactic Acid, Venous 1.83 0.5 - 1.9 mmol/L   No results found. EKG Interpretation  Date/Time:  Monday December 19 2017 20:57:14 EDT Ventricular Rate:  94 PR Interval:    QRS Duration: 146 QT Interval:  461 QTC Calculation: 577 R Axis:   -82 Text Interpretation:  Sinus rhythm Right bundle branch block Inferior infarct, old Probable anterior infarct, age indeterminate old RBBB. no sig change from previous Confirmed by Charlesetta Shanks [phone]) on 12/19/2017 9:04:39 PM   Pending Labs Unresulted Labs (From admission, onward)   None       Vitals/Pain Today's Vitals   12/19/17 2300 12/19/17 2330 12/20/17 0000 12/20/17 0100  BP: (!) 109/53 (!) 97/58 (!) 111/52 (!) 102/49  Pulse: 92 92 91 80  Resp: (!) 28 (!) 26 (!) 29 (!) 26  Temp:      TempSrc:      SpO2: 93% 95% 93% 94%    Isolation Precautions No active isolations  Medications Medications  sodium chloride 0.9 % bolus 1,000 mL (0 mLs Intravenous Stopped 12/19/17 2209)    Followed by  0.9 %  sodium chloride infusion (1,000 mLs Intravenous Not Given 12/20/17 0121)  0.9 %  sodium chloride infusion (has no administration in time range)  sodium chloride 0.9 % bolus 500 mL (has no administration in time range)  promethazine (PHENERGAN) injection 12.5 mg (12.5 mg Intravenous Given 12/19/17 2138)    Mobility walks

## 2017-12-20 NOTE — H&P (Signed)
History and Physical    Patty [name]:[id number] DOB: Jul 09, 1940 DOA: 12/19/2017  PCP: Deland Pretty, MD  Patient coming from: Home.  Chief Complaint: Nausea vomiting and diarrhea.  HPI: Patty [name] is a 77 y.o. female with history of asthma, hypertension, paroxysmal atrial fibrillation, hypothyroidism, diabetes mellitus type 2 has been experiencing some nausea vomiting 3 days ago when patient was in [place] visiting in [place].  The following day patient symptoms resolved.  Then yesterday patient had multiple episodes of nausea vomiting and diarrhea afternoon after having green pepper sandwich.  Denies any abdominal pain.  Denies any chest pain or shortness of breath was feeling very fatigued.  Was brought to the ER.  Denies any blood in the vomitus or diarrhea.  No one else in the family is sick.  Was recently placed on doxycycline for UTI.  ED Course: In the ER abdomen appears benign.  Patient was hypotensive on the presentation and as per the ER patient has received a total of 3 L fluids.  Patient still appears fatigued and weak and admitted for dehydration and further work-up for nausea vomiting and diarrhea.  Has not had any further episodes in the ER.  Review of Systems: As per HPI, rest all negative.   Past Medical History:  Diagnosis Date  . Arthritis   . Asthma   . Breast cancer, right ([place]) 2/17   R sided, s/p total mastectomy  . Chronic kidney disease    recurring UTI  . Complication of anesthesia    "morphine made me stop breathing"  . HCAP (healthcare-associated pneumonia) 12/2013   Archie Endo 01/18/2014  . History of skin cancer   . History of transfusion   . Hypertension   . Hypothyroidism   . Left atrial enlargement   . 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 Nocturia   . Persistent atrial fibrillation   . Shortness of breath dyspnea    "at times"  . Skin cancer   . Sleep apnea    uses c-pap  . Type II diabetes mellitus ([place])     Past  Surgical History:  Procedure Laterality Date  . ABDOMINAL HYSTERECTOMY  1972   partial  . APPENDECTOMY  1953  . ATRIAL FIBRILLATION ABLATION  08/19/2015   PVI with CTI ablation by Dr Rayann Heman  . BACK SURGERY     x2  . BREAST SURGERY     Right mastectomy  . CARDIAC CATHETERIZATION  10/10/2008   Nonobstructive CAD  . CARDIOVERSION  04/13/2011   Procedure: CARDIOVERSION;  Surgeon: Laverda Page, MD;  Location: [place];  Service: Cardiovascular;  Laterality: N/A;  . CARDIOVERSION N/A 06/18/2015   Procedure: CARDIOVERSION;  Surgeon: Adrian Prows, MD;  Location[facility] ENDOSCOPY;  Service: Cardiovascular;  Laterality: N/A;  . CARDIOVERSION N/A 10/26/2016   Procedure: CARDIOVERSION;  Surgeon: Adrian Prows, MD;  Location: [place];  Service: Cardiovascular;  Laterality: N/A;  . CARDIOVERSION N/A 10/11/2017   Procedure: CARDIOVERSION;  Surgeon: Nigel Mormon, MD;  Location: [facility] ENDOSCOPY;  Service: Cardiovascular;  Laterality: N/A;  . CATARACT EXTRACTION W/ INTRAOCULAR LENS  IMPLANT, BILATERAL Bilateral   . CHOLECYSTECTOMY  1993  . ELECTROPHYSIOLOGIC STUDY N/A 08/19/2015   Procedure: Atrial Fibrillation Ablation;  Surgeon: Thompson Grayer, MD;  Location: [place] CV LAB;  Service: Cardiovascular;  Laterality: N/A;  . FOOT SURGERY    . IR REMOVAL TUN ACCESS W/ PORT W/O FL MOD SED  04/01/2017  . LUMBAR LAMINECTOMY    .  MASS BIOPSY Left 12/07/2013   Procedure: EXCISIONAL BIOPSY LEFT NECK MASS ;  Surgeon: Jerrell Belfast, MD;  Location: [place];  Service: ENT;  Laterality: Left;  Marland Kitchen MASTECTOMY Right 2017   malignant  . PORTACATH PLACEMENT  12/2013   Archie Endo 01/04/2014  . PUBOVAGINAL SLING    . SKIN TAG REMOVAL  2012   leg  . TEE WITHOUT CARDIOVERSION N/A 08/19/2015   Procedure: TRANSESOPHAGEAL ECHOCARDIOGRAM (TEE);  Surgeon: Larey Dresser, MD;  Location: [place];  Service: Cardiovascular;  Laterality: N/A;  . TONSILLECTOMY    . TOTAL MASTECTOMY Right 05/27/2015   Procedure: RIGHT TOTAL  MASTECTOMY;  Surgeon: Fanny Skates, MD;  Location: [facility];  Service: General;  Laterality: Right;     reports that she has never smoked. She has never used smokeless tobacco. She reports that she does not drink alcohol or use drugs.  Allergies  Allergen Reactions  . Morphine And Related Anaphylaxis and Other (See Comments)    Pt states she stopped breathing post op- "went into resp. arrest"  . Multaq [Dronedarone] Other (See Comments)    Reaction:  Blood in urine and elevated liver enzymes.   . Demerol Nausea And Vomiting  . Oysters [Shellfish Allergy] Rash    & CLAMS  . Penicillins Itching and Rash    Has patient had a PCN reaction causing immediate rash, facial/tongue/throat swelling, SOB or lightheadedness with hypotension: No Has patient had a PCN reaction causing severe rash involving mucus membranes or skin necrosis: Yes Has patient had a PCN reaction that required hospitalization No Has patient had a PCN reaction occurring within the last 10 years: No If all of the above answers are "NO", then may proceed with Cephalosporin use.   [facility] Drugs Cross Reactors Rash    Family History  Problem Relation Age of Onset  . Stroke Father 56  . Heart attack Father 36  . Hypertension Father   . Heart disease Mother   . Arthritis Mother   . Breast cancer Mother 74  . Heart failure Mother   . Heart attack Brother   . Hypertension Brother   . Diabetes Sister     Prior to Admission medications   Medication Sig Start Date End Date Taking? Authorizing Provider  acetaminophen (TYLENOL) 500 MG tablet Take 1,000 mg by mouth every 6 (six) hours as needed for moderate pain, fever or headache.   Yes [provider]  albuterol (PROVENTIL HFA;VENTOLIN HFA) 108 (90 Base) MCG/ACT inhaler Inhale into the lungs as directed.   Yes [provider]  anastrozole (ARIMIDEX) 1 MG tablet TAKE ONE TABLET BY MOUTH EVERY DAY 08/03/17  Yes Gorsuch, Ni, MD  Cholecalciferol (VITAMIN D) 2000  UNITS tablet Take 2,000 Units by mouth daily with lunch.   Yes [provider]  Cyanocobalamin (B-12) 2500 MCG TABS Take 2,500 mcg by mouth daily.   Yes [provider]  diltiazem (TIAZAC) 120 MG 24 hr capsule Take 120 mg by mouth daily. 10/11/16  Yes [provider]  doxycycline (VIBRAMYCIN) 100 MG capsule Take 100 mg by mouth at bedtime.  10/06/16  Yes [provider]  Fluticasone-Salmeterol (ADVAIR) 250-50 MCG/DOSE AEPB Inhale 2 puffs into the lungs every 12 (twelve) hours.    Yes [provider]  gabapentin (NEURONTIN) 300 MG capsule Take 300 mg by mouth at bedtime.    Yes [provider]  levothyroxine (SYNTHROID, LEVOTHROID) 200 MCG tablet Take 200 mcg by mouth daily before breakfast.   Yes [provider]  Magnesium Oxide 500 MG TABS Take 500 mg by mouth 2 (two) times daily.    Yes [provider]  metFORMIN (GLUCOPHAGE-XR) 500 MG 24 hr tablet Take 500 mg by mouth 2 (two) times daily with a meal.   Yes [provider]  montelukast (SINGULAIR) 10 MG tablet Take 10 mg by mouth daily.   Yes [provider]  omega-3 acid ethyl esters (LOVAZA) 1 g capsule Take 1 g by mouth 2 (two) times daily.   Yes [provider]  pravastatin (PRAVACHOL) 20 MG tablet Take 10 mg by mouth See admin instructions. Take 0.5 tablet (10 mg) by mouth in the evening on Mondays through Thursdays.   Yes [provider]  PROLIA 60 MG/ML SOSY injection Inject 60 mg into the vein every 6 (six) months. 07/18/17  Yes [provider]  rivaroxaban (XARELTO) 20 MG TABS tablet Take 20 mg by mouth daily with supper.   Yes [provider]  triamterene-hydrochlorothiazide (MAXZIDE-25) 37.5-25 MG tablet Take 1 tablet by mouth daily as needed (for fluid retention/swelling.).  07/29/16  Yes [provider]    Physical Exam: Vitals:   12/20/17 0000 12/20/17 0100 12/20/17 0130 12/20/17 0152  BP: (!) 111/52  (!) 102/49 (!) 103/47 101/60  Pulse: 91 80 80 83  Resp: (!) 29 (!) 26 (!) 27 20  Temp:    98.4 F (36.9 C)  TempSrc:    Oral  SpO2: 93% 94% 94% 97%      Constitutional: Moderately built and nourished. Vitals:   12/20/17 0000 12/20/17 0100 12/20/17 0130 12/20/17 0152  BP: (!) 111/52 (!) 102/49 (!) 103/47 101/60  Pulse: 91 80 80 83  Resp: (!) 29 (!) 26 (!) 27 20  Temp:    98.4 F (36.9 C)  TempSrc:    Oral  SpO2: 93% 94% 94% 97%   Eyes: Anicteric no pallor. ENMT: No discharge from the ears eyes nose or mouth. Neck: No mass or.  No neck rigidity but no JVD appreciated. Respiratory: No rhonchi or crepitations. Cardiovascular: S1-S2 heard no murmurs appreciated. Abdomen: Soft nontender bowel sounds present. Musculoskeletal: No edema.  No joint effusion. Skin: No rash.  Skin appears warm. Neurologic: Mildly lethargic but easily arousable and answers questions appropriately and moves all extremities. Psychiatric: Appears mildly lethargic but answers questions appropriately.   Labs on Admission: I have personally reviewed following labs and imaging studies  CBC: Recent Labs  Lab 12/19/17 1945 12/19/17 2121  WBC 9.4  --   NEUTROABS 8.6*  --   HGB 11.8* 11.2*  HCT 36.9 33.0*  MCV 90.2  --   PLT 192  --    Basic Metabolic Panel: Recent Labs  Lab 12/19/17 1945 12/19/17 2121  NA 136 138  K 3.6 3.4*  CL 107 104  CO2 20*  --   GLUCOSE 179* 174*  BUN 24* 23  CREATININE 0.73 0.60  CALCIUM 8.4*  --    GFR: CrCl cannot be calculated (Unknown ideal weight.). Liver Function Tests: Recent Labs  Lab 12/19/17 1945  AST 54*  ALT 62*  ALKPHOS 72  BILITOT 0.6  PROT 6.4*  ALBUMIN 3.9   Recent Labs  Lab 12/19/17 1945  LIPASE 17   No results for input(s): AMMONIA in the last 168 hours. Coagulation Profile: Recent Labs  Lab 12/19/17 1945  INR 1.19   Cardiac Enzymes: No results for input(s): CKTOTAL, CKMB, CKMBINDEX, TROPONINI in the last 168 hours. BNP (last  3 results)  No results for input(s): PROBNP in the last 8760 hours. HbA1C: No results for input(s): HGBA1C in the last 72 hours. CBG: No results for input(s): GLUCAP in the last 168 hours. Lipid Profile: No results for input(s): CHOL, HDL, LDLCALC, TRIG, CHOLHDL, LDLDIRECT in the last 72 hours. Thyroid Function Tests: No results for input(s): TSH, T4TOTAL, FREET4, T3FREE, THYROIDAB in the last 72 hours. Anemia Panel: No results for input(s): VITAMINB12, FOLATE, FERRITIN, TIBC, IRON, RETICCTPCT in the last 72 hours. Urine analysis:    Component Value Date/Time   COLORURINE YELLOW 12/19/2017 2101   APPEARANCEUR HAZY (A) 12/19/2017 2101   LABSPEC 1.015 12/19/2017 2101   LABSPEC 1.010 06/24/2016 1356   PHURINE 5.0 12/19/2017 2101   GLUCOSEU NEGATIVE 12/19/2017 2101   GLUCOSEU Negative 06/24/2016 1356   HGBUR SMALL (A) 12/19/2017 2101   BILIRUBINUR NEGATIVE 12/19/2017 2101   BILIRUBINUR Negative 06/24/2016 [place] 12/19/2017 2101   PROTEINUR NEGATIVE 12/19/2017 2101   UROBILINOGEN 0.2 06/24/2016 1356   NITRITE POSITIVE (A) 12/19/2017 2101   LEUKOCYTESUR TRACE (A) 12/19/2017 2101   LEUKOCYTESUR Moderate 06/24/2016 1356   Sepsis Labs: @LABRCNTIP (procalcitonin:4,lacticidven:4) )No results found for this or any previous visit (from the past 240 hour(s)).   Radiological Exams on Admission: No results found.  EKG: Independently reviewed.  Normal sinus rhythm with RBBB.  Assessment/Plan Principal Problem:   Nausea vomiting and diarrhea Active Problems:   Paroxysmal atrial fibrillation (HCC)   Long term current use of anticoagulant therapy   Controlled type 2 diabetes mellitus without complication, without long-term current use of insulin ([place])   Dyslipidemia associated with type 2 diabetes mellitus (HCC)   Breast cancer of upper-inner quadrant of right female breast (HCC)   Essential hypertension   Iron deficiency anemia   Dehydration   Nausea & vomiting     1. Nausea vomiting and diarrhea likely from gastroenteritis -continue with hydration and closely observe.  Check stool studies if there is any further episodes. 2. Diabetes mellitus type 2 we will keep patient on sliding scale coverage and hold metformin while inpatient. 3. Paroxysmal atrial fibrillation presently in sinus rhythm has had recent cardioversion.  On Xarelto.  And Cardizem. 4. Hypertension we will hold hydrochlorothiazide as patient is receiving fluids.  We will continue Cardizem if blood pressure is agreeable. 5. Dyslipidemia on statins. 6. Asthma presently not wheezing continue inhalers. 7. Hypothyroidism on Synthroid. 8. History of breast cancer on anastrozole.   DVT prophylaxis: Xarelto. Code Status: Full code. Family Communication: Discussed with patient's daughter. Disposition Plan: Home. Consults called: None. Admission status: Observation.   Rise Patience MD [facility] Pager [phone].  If 7PM-7AM, please contact night-coverage [email or web address] Password [email or web address]  12/20/2017, 2:07 AM

## 2017-12-20 NOTE — Care Management Important Message (Signed)
Important Message  Patient Details  Name: Patty [name] MRN: [id number] Date of Birth: 1940/08/30   Medicare Important Message Given:       Dessa Phi, RN 12/20/2017, 4:20 PM

## 2017-12-21 DIAGNOSIS — R112 Nausea with vomiting, unspecified: Secondary | ICD-10-CM | POA: Diagnosis not present

## 2017-12-21 DIAGNOSIS — R197 Diarrhea, unspecified: Secondary | ICD-10-CM

## 2017-12-21 LAB — CBC WITH DIFFERENTIAL/PLATELET
Abs Immature Granulocytes: 0.1 10*3/uL — ABNORMAL HIGH (ref 0.00–0.07)
BASOS ABS: 0 10*3/uL (ref 0.0–0.1)
Basophils Relative: 0 %
EOS PCT: 0 %
Eosinophils Absolute: 0 10*3/uL (ref 0.0–0.5)
HCT: 32.8 % — ABNORMAL LOW (ref 36.0–46.0)
HEMOGLOBIN: 10.4 g/dL — AB (ref 12.0–15.0)
IMMATURE GRANULOCYTES: 2 %
LYMPHS PCT: 14 %
Lymphs Abs: 0.8 10*3/uL (ref 0.7–4.0)
MCH: 29 pg (ref 26.0–34.0)
MCHC: 31.7 g/dL (ref 30.0–36.0)
MCV: 91.4 fL (ref 80.0–100.0)
Monocytes Absolute: 0.4 10*3/uL (ref 0.1–1.0)
Monocytes Relative: 7 %
Neutro Abs: 4.4 10*3/uL (ref 1.7–7.7)
Neutrophils Relative %: 77 %
Platelets: 138 10*3/uL — ABNORMAL LOW (ref 150–400)
RBC: 3.59 MIL/uL — AB (ref 3.87–5.11)
RDW: 15.2 % (ref 11.5–15.5)
WBC: 5.7 10*3/uL (ref 4.0–10.5)
nRBC: 0 % (ref 0.0–0.2)

## 2017-12-21 LAB — BASIC METABOLIC PANEL
Anion gap: 6 (ref 5–15)
BUN: 12 mg/dL (ref 8–23)
CHLORIDE: 110 mmol/L (ref 98–111)
CO2: 21 mmol/L — AB (ref 22–32)
CREATININE: 0.67 mg/dL (ref 0.44–1.00)
Calcium: 7.4 mg/dL — ABNORMAL LOW (ref 8.9–10.3)
GFR calc Af Amer: >60 mL/min (ref >60)
GFR calc non Af Amer: >60 mL/min (ref >60)
Glucose, Bld: 121 mg/dL — ABNORMAL HIGH (ref 70–99)
Potassium: 4.4 mmol/L (ref 3.5–5.1)
SODIUM: 137 mmol/L (ref 135–145)

## 2017-12-21 LAB — GLUCOSE, CAPILLARY: GLUCOSE-CAPILLARY: 116 mg/dL — AB (ref 70–99)

## 2017-12-21 NOTE — Discharge Summary (Signed)
Physician Discharge Summary  KHALI PERELLA ZYS:[id number] DOB: [birth year] DOA: 12/19/2017  PCP: Deland Pretty, MD  Admit date: 12/19/2017 Discharge date: 12/21/2017  Time spent: 35 minutes  Recommendations for Outpatient Follow-up:  1. Follow-up with outpatient practitioners including cardiology to PCP and get labs in a week 2. Needs follow-up with Dr. Jeffie Pollock as an outpatient with regards to suppressive antibiotic therapy versus not  Discharge Diagnoses:  Principal Problem:   Nausea vomiting and diarrhea Active Problems:   Paroxysmal atrial fibrillation ([place])   Long term current use of anticoagulant therapy   Controlled type 2 diabetes mellitus without complication, without long-term current use of insulin ([place])   Dyslipidemia associated with type 2 diabetes mellitus ([place])   Breast cancer of upper-inner quadrant of right female breast ([place])   Essential hypertension   Iron deficiency anemia   Dehydration   Nausea & vomiting   Discharge Condition: Improved  Diet recommendation: Heart healthy  Filed Weights   12/20/17 0545 12/21/17 0500  Weight: 68.2 kg 68.6 kg    History of present illness:  77 year old female history of cancer paroxysmal A. fib and multiple other chronic medical issues was admitted over night in the hospital 10/22 to 10/23 with nausea vomiting diarrhea [place] [facility] She improved symptomatically was tolerating a diet did not have any further diarrhea and it was felt that her diarrhea was probably noninfectious and may be a transient viral illness-it is noted that the patient is on chronic doxycycline suppressive therapy for UTIs and I did recommend that if she had diarrhea she stay in the hospital overnight and get this worked up    Discharge Exam: Vitals:   12/21/17 0437 12/21/17 0828  BP: 121/66   Pulse: 75   Resp: 20   Temp: 99.1 F (37.3 C)   SpO2: 97% 97%    General: Awake alert pleasant no  distress Cardiovascular: S1-S2 no murmur  Respiratory: clinically clear no added sound Abdomen soft nontender no rebound   Discharge Instructions   Discharge Instructions    Diet - low sodium heart healthy   Complete by:  As directed    Discharge instructions   Complete by:  As directed    Continue all medications without change Please follow with your primary MD and your cancer MD as well as your cardiologist Get some labs in about 1 week Best of luck and enjoy the fall   Increase activity slowly   Complete by:  As directed      Allergies as of 12/21/2017      Reactions   Morphine And Related Anaphylaxis, Other (See Comments)   Pt states she stopped breathing post op- "went into resp. arrest"   Multaq [dronedarone] Other (See Comments)   Reaction:  Blood in urine and elevated liver enzymes.    Demerol Nausea And Vomiting   Oysters [shellfish Allergy] Rash   & CLAMS   Penicillins Itching, Rash   Has patient had a PCN reaction causing immediate rash, facial/tongue/throat swelling, SOB or lightheadedness with hypotension: No Has patient had a PCN reaction causing severe rash involving mucus membranes or skin necrosis: Yes Has patient had a PCN reaction that required hospitalization No Has patient had a PCN reaction occurring within the last 10 years: No If all of the above answers are "NO", then may proceed with Cephalosporin use.   Sulfa Drugs Cross Reactors Rash      Medication List    STOP taking these medications  acetaminophen 500 MG tablet Commonly known as:  TYLENOL     TAKE these medications   albuterol 108 (90 Base) MCG/ACT inhaler Commonly known as:  PROVENTIL HFA;VENTOLIN HFA Inhale into the lungs as directed.   anastrozole 1 MG tablet Commonly known as:  ARIMIDEX TAKE ONE TABLET BY MOUTH EVERY DAY   B-12 2500 MCG Tabs Take 2,500 mcg by mouth daily.   diltiazem 120 MG 24 hr capsule Commonly known as:  TIAZAC Take 120 mg by mouth daily.    doxycycline 100 MG capsule Commonly known as:  VIBRAMYCIN Take 100 mg by mouth at bedtime.   Fluticasone-Salmeterol 250-50 MCG/DOSE Aepb Commonly known as:  ADVAIR Inhale 2 puffs into the lungs every 12 (twelve) hours.   gabapentin 300 MG capsule Commonly known as:  NEURONTIN Take 300 mg by mouth at bedtime.   levothyroxine 200 MCG tablet Commonly known as:  SYNTHROID, LEVOTHROID Take 200 mcg by mouth daily before breakfast.   Magnesium Oxide 500 MG Tabs Take 500 mg by mouth 2 (two) times daily.   metFORMIN 500 MG 24 hr tablet Commonly known as:  GLUCOPHAGE-XR Take 500 mg by mouth 2 (two) times daily with a meal.   montelukast 10 MG tablet Commonly known as:  SINGULAIR Take 10 mg by mouth daily.   omega-3 acid ethyl esters 1 g capsule Commonly known as:  LOVAZA Take 1 g by mouth 2 (two) times daily.   pravastatin 20 MG tablet Commonly known as:  PRAVACHOL Take 10 mg by mouth See admin instructions. Take 0.5 tablet (10 mg) by mouth in the evening on Mondays through Thursdays.   PROLIA 60 MG/ML Sosy injection Generic drug:  denosumab Inject 60 mg into the vein every 6 (six) months.   rivaroxaban 20 MG Tabs tablet Commonly known as:  XARELTO Take 20 mg by mouth daily with supper.   triamterene-hydrochlorothiazide 37.5-25 MG tablet Commonly known as:  MAXZIDE-25 Take 1 tablet by mouth daily as needed (for fluid retention/swelling.).   Vitamin D 2000 units tablet Take 2,000 Units by mouth daily with lunch.      Allergies  Allergen Reactions  . Morphine And Related Anaphylaxis and Other (See Comments)    Pt states she stopped breathing post op- "went into resp. arrest"  . Multaq [Dronedarone] Other (See Comments)    Reaction:  Blood in urine and elevated liver enzymes.   . Demerol Nausea And Vomiting  . Oysters [Shellfish Allergy] Rash    & CLAMS  . Penicillins Itching and Rash    Has patient had a PCN reaction causing immediate rash, facial/tongue/throat  swelling, SOB or lightheadedness with hypotension: No Has patient had a PCN reaction causing severe rash involving mucus membranes or skin necrosis: Yes Has patient had a PCN reaction that required hospitalization No Has patient had a PCN reaction occurring within the last 10 years: No If all of the above answers are "NO", then may proceed with Cephalosporin use.   . Sulfa Drugs Cross Reactors Rash      The results of significant diagnostics from this hospitalization (including imaging, microbiology, ancillary and laboratory) are listed below for reference.    Significant Diagnostic Studies: No results found.  Microbiology: No results found for this or any previous visit (from the past 240 hour(s)).   Labs: Basic Metabolic Panel: Recent Labs  Lab 12/19/17 1945 12/19/17 2121 12/20/17 0422 12/21/17 0437  NA 136 138 138 137  K 3.6 3.4* 3.2* 4.4  CL 107 104 108 110  CO2 20*  --  21* 21*  GLUCOSE 179* 174* 141* 121*  BUN 24* 23 20 12   CREATININE 0.73 0.60 0.61 0.67  CALCIUM 8.4*  --  7.6* 7.4*   Liver Function Tests: Recent Labs  Lab 12/19/17 1945 12/20/17 0422  AST 54* 39  ALT 62* 50*  ALKPHOS 72 55  BILITOT 0.6 0.8  PROT 6.4* 5.4*  ALBUMIN 3.9 3.2*   Recent Labs  Lab 12/19/17 1945  LIPASE 17   No results for input(s): AMMONIA in the last 168 hours. CBC: Recent Labs  Lab 12/19/17 1945 12/19/17 2121 12/20/17 0422 12/21/17 0437  WBC 9.4  --  6.5 5.7  NEUTROABS 8.6*  --   --  4.4  HGB 11.8* 11.2* 14.5 10.4*  HCT 36.9 33.0* 44.7 32.8*  MCV 90.2  --  88.5 91.4  PLT 192  --  106* 138*   Cardiac Enzymes: No results for input(s): CKTOTAL, CKMB, CKMBINDEX, TROPONINI in the last 168 hours. BNP: BNP (last 3 results) No results for input(s): BNP in the last 8760 hours.  ProBNP (last 3 results) No results for input(s): PROBNP in the last 8760 hours.  CBG: Recent Labs  Lab 12/20/17 0758 12/20/17 1221 12/20/17 1656 12/20/17 2057  GLUCAP 122* 122* 133*  114*       Signed:  Nita Sells MD   [facility] 12/21/2017, 11:41 AM

## 2017-12-21 NOTE — Progress Notes (Signed)
Subjective:   Patient ID: Patty [name], female   DOB: 77 y.o.   MRN: [id number]   HPI Patient presents with significant nail disease 1-5 both feet and also has history of neuropathy and is concerned because it seems to be worsening.  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  I debrided nailbeds 1-5 both feet with no iatrogenic bleeding noted

## 2017-12-22 DIAGNOSIS — N312 Flaccid neuropathic bladder, not elsewhere classified: Secondary | ICD-10-CM | POA: Diagnosis not present

## 2017-12-22 DIAGNOSIS — N3000 Acute cystitis without hematuria: Secondary | ICD-10-CM | POA: Diagnosis not present

## 2017-12-22 LAB — GLUCOSE, CAPILLARY: Glucose-Capillary: 106 mg/dL — ABNORMAL HIGH (ref 70–99)

## 2017-12-26 DIAGNOSIS — E11610 Type 2 diabetes mellitus with diabetic neuropathic arthropathy: Secondary | ICD-10-CM | POA: Diagnosis not present

## 2017-12-26 DIAGNOSIS — I480 Paroxysmal atrial fibrillation: Secondary | ICD-10-CM | POA: Diagnosis not present

## 2017-12-26 DIAGNOSIS — I10 Essential (primary) hypertension: Secondary | ICD-10-CM | POA: Diagnosis not present

## 2017-12-26 DIAGNOSIS — Z09 Encounter for follow-up examination after completed treatment for conditions other than malignant neoplasm: Secondary | ICD-10-CM | POA: Diagnosis not present

## 2018-01-02 DIAGNOSIS — L821 Other seborrheic keratosis: Secondary | ICD-10-CM | POA: Diagnosis not present

## 2018-01-02 DIAGNOSIS — L820 Inflamed seborrheic keratosis: Secondary | ICD-10-CM | POA: Diagnosis not present

## 2018-01-02 DIAGNOSIS — Z85828 Personal history of other malignant neoplasm of skin: Secondary | ICD-10-CM | POA: Diagnosis not present

## 2018-01-02 DIAGNOSIS — L814 Other melanin hyperpigmentation: Secondary | ICD-10-CM | POA: Diagnosis not present

## 2018-01-02 DIAGNOSIS — D225 Melanocytic nevi of trunk: Secondary | ICD-10-CM | POA: Diagnosis not present

## 2018-01-02 DIAGNOSIS — D1801 Hemangioma of skin and subcutaneous tissue: Secondary | ICD-10-CM | POA: Diagnosis not present

## 2018-01-05 DIAGNOSIS — R197 Diarrhea, unspecified: Secondary | ICD-10-CM | POA: Diagnosis not present

## 2018-01-05 DIAGNOSIS — N312 Flaccid neuropathic bladder, not elsewhere classified: Secondary | ICD-10-CM | POA: Diagnosis not present

## 2018-01-05 DIAGNOSIS — R1310 Dysphagia, unspecified: Secondary | ICD-10-CM | POA: Diagnosis not present

## 2018-01-05 DIAGNOSIS — N3000 Acute cystitis without hematuria: Secondary | ICD-10-CM | POA: Diagnosis not present

## 2018-01-09 ENCOUNTER — Other Ambulatory Visit: Payer: Self-pay | Admitting: Gastroenterology

## 2018-01-09 DIAGNOSIS — R194 Change in bowel habit: Secondary | ICD-10-CM | POA: Diagnosis not present

## 2018-01-09 DIAGNOSIS — Z86010 Personal history of colonic polyps: Secondary | ICD-10-CM | POA: Diagnosis not present

## 2018-01-09 DIAGNOSIS — R110 Nausea: Secondary | ICD-10-CM | POA: Diagnosis not present

## 2018-01-09 DIAGNOSIS — R1310 Dysphagia, unspecified: Secondary | ICD-10-CM | POA: Diagnosis not present

## 2018-01-09 DIAGNOSIS — R945 Abnormal results of liver function studies: Secondary | ICD-10-CM | POA: Diagnosis not present

## 2018-01-10 DIAGNOSIS — R197 Diarrhea, unspecified: Secondary | ICD-10-CM | POA: Diagnosis not present

## 2018-01-12 DIAGNOSIS — N3000 Acute cystitis without hematuria: Secondary | ICD-10-CM | POA: Diagnosis not present

## 2018-01-17 ENCOUNTER — Encounter: Payer: Self-pay | Admitting: Internal Medicine

## 2018-01-18 ENCOUNTER — Other Ambulatory Visit: Payer: Self-pay | Admitting: Gastroenterology

## 2018-01-18 ENCOUNTER — Encounter: Payer: Self-pay | Admitting: Internal Medicine

## 2018-01-18 ENCOUNTER — Ambulatory Visit (INDEPENDENT_AMBULATORY_CARE_PROVIDER_SITE_OTHER): Payer: Medicare Other | Admitting: Internal Medicine

## 2018-01-18 VITALS — BP 118/60 | HR 86 | Ht 66.0 in | Wt 140.4 lb

## 2018-01-18 DIAGNOSIS — I484 Atypical atrial flutter: Secondary | ICD-10-CM | POA: Diagnosis not present

## 2018-01-18 DIAGNOSIS — I4891 Unspecified atrial fibrillation: Secondary | ICD-10-CM | POA: Diagnosis not present

## 2018-01-18 DIAGNOSIS — R7989 Other specified abnormal findings of blood chemistry: Secondary | ICD-10-CM

## 2018-01-18 DIAGNOSIS — R945 Abnormal results of liver function studies: Secondary | ICD-10-CM

## 2018-01-18 NOTE — Progress Notes (Signed)
PCP: Deland Pretty, MD Primary Cardiologist: Dr Einar Gip Primary EP: Dr Rayann Heman  Patty [name] is a 77 y.o. female who presents today for routine electrophysiology followup.  Since last being seen in our clinic, the patient reports doing reasonably well.  She had atypical atrial flutter in August for which she underwent cardioversion.  She has done well since that time. She is pleased with her current health state.  Today, she denies symptoms of palpitations, chest pain, shortness of breath,  lower extremity edema, dizziness, presyncope, or syncope.  The patient is otherwise without complaint today.   Past Medical History:  Diagnosis Date  . Arthritis   . Asthma   . Atrial flutter (HCC)    PAROXYSMAL, EKG 10/06/2017, ATRIAL FLUTTER (ATYPICAL) WITH VARIABLE VENTRICULAR RESPONCE AND CONTROLLED  . Breast cancer, right ([place]) 2/17   R sided, s/p total mastectomy  . Bulging lumbar disc    3  . Chronic kidney disease    recurring UTI  . Complication of anesthesia    "morphine made me stop breathing"  . Concentric left ventricular hypertrophy    MILD DECREASE IN GLOBAL WALL MOTION. DOPPLER EVIDENCE OF GRADE II (PSEUDONORMAL) DIASTOLIC DYSFUNCTION, ELEVATED LAP, CALCULATED EF 45%, LEFT ATRIAL CAVITY IS MILDLY DILATED. MILD TO MODERATE AORTIC REGURGITATION. MILD (GRADE I) MITRAL REGURGITATION. MILD TRICUSPID REGURGITATION. ESTIMATED PULMONARY ARTERY SYSTOLIC PRESSURE 22 MMHG.  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 First degree AV block   . HCAP (healthcare-associated pneumonia) 12/2013   Archie Endo 01/18/2014  . History of skin cancer   . History of transfusion   . Hypertension   . Hypothyroidism   . Left atrial enlargement   . Leg swelling   . 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 Malaise and fatigue   . Nocturia   . Paroxysmal atrial fibrillation (HCC)   . Persistent atrial fibrillation   . Pure hypercholesterolemia   .  Shortness of breath dyspnea    "at times"  . Skin cancer   . Sleep apnea    uses c-pap  . Type II diabetes mellitus ([place])    Past Surgical History:  Procedure Laterality Date  . ABDOMINAL HYSTERECTOMY  1972   partial  . ABDOMINAL HYSTERECTOMY    . APPENDECTOMY  1953  . ATRIAL FIBRILLATION ABLATION  08/19/2015   PVI with CTI ablation by Dr Rayann Heman  . BACK SURGERY     x2  . BREAST SURGERY     Right mastectomy  . CARDIAC CATHETERIZATION  10/10/2008   Nonobstructive CAD  . CARDIOVERSION  04/13/2011   Procedure: CARDIOVERSION;  Surgeon: Laverda Page, MD;  Location: [facility];  Service: Cardiovascular;  Laterality: N/A;  . CARDIOVERSION N/A 06/18/2015   Procedure: CARDIOVERSION;  Surgeon: Adrian Prows, MD;  Location: [facility] ENDOSCOPY;  Service: Cardiovascular;  Laterality: N/A;  . CARDIOVERSION N/A 10/26/2016   Procedure: CARDIOVERSION;  Surgeon: Adrian Prows, MD;  Location: [facility];  Service: Cardiovascular;  Laterality: N/A;  . CARDIOVERSION N/A 10/11/2017   Procedure: CARDIOVERSION;  Surgeon: Nigel Mormon, MD;  Location: [facility] ENDOSCOPY;  Service: Cardiovascular;  Laterality: N/A;  . CATARACT EXTRACTION W/ INTRAOCULAR LENS  IMPLANT, BILATERAL Bilateral   . CHOLECYSTECTOMY  1993  . ELECTROPHYSIOLOGIC STUDY N/A 08/19/2015   Procedure: Atrial Fibrillation Ablation;  Surgeon: Thompson Grayer, MD;  Location: [facility] CV LAB;  Service: Cardiovascular;  Laterality: N/A;  . FOOT SURGERY    .  IR REMOVAL TUN ACCESS W/ PORT W/O FL MOD SED  04/01/2017  . LUMBAR LAMINECTOMY    . MASS BIOPSY Left 12/07/2013   Procedure: EXCISIONAL BIOPSY LEFT NECK MASS ;  Surgeon: Jerrell Belfast, MD;  Location: [facility];  Service: ENT;  Laterality: Left;  Marland Kitchen MASTECTOMY Right 2017   malignant  . PORTACATH PLACEMENT  12/2013   Archie Endo 01/04/2014  . PUBOVAGINAL SLING    . SKIN TAG REMOVAL  2012   leg  . TEE WITHOUT CARDIOVERSION N/A 08/19/2015   Procedure: TRANSESOPHAGEAL ECHOCARDIOGRAM (TEE);  Surgeon: Larey Dresser,  MD;  Location: [facility];  Service: Cardiovascular;  Laterality: N/A;  . TONSILLECTOMY    . TOTAL MASTECTOMY Right 05/27/2015   Procedure: RIGHT TOTAL MASTECTOMY;  Surgeon: Fanny Skates, MD;  Location: [facility];  Service: General;  Laterality: Right;    ROS- all systems are reviewed and negatives except as per HPI above  Current Outpatient Medications  Medication Sig Dispense Refill  . albuterol (PROVENTIL HFA;VENTOLIN HFA) 108 (90 Base) MCG/ACT inhaler Inhale into the lungs as directed.    Marland Kitchen anastrozole (ARIMIDEX) 1 MG tablet TAKE ONE TABLET BY MOUTH EVERY DAY 90 tablet 9  . Cholecalciferol (VITAMIN D) 2000 UNITS tablet Take 2,000 Units by mouth daily with lunch.    . Cyanocobalamin (B-12) 2500 MCG TABS Take 2,500 mcg by mouth daily.    Marland Kitchen diltiazem (TIAZAC) 120 MG 24 hr capsule Take 120 mg by mouth daily.    Marland Kitchen doxycycline (VIBRAMYCIN) 100 MG capsule Take 100 mg by mouth at bedtime.     . Fluticasone-Salmeterol (ADVAIR) 250-50 MCG/DOSE AEPB Inhale 2 puffs into the lungs every 12 (twelve) hours.     . gabapentin (NEURONTIN) 300 MG capsule Take 300 mg by mouth at bedtime.     Marland Kitchen levothyroxine (SYNTHROID, LEVOTHROID) 200 MCG tablet Take 200 mcg by mouth daily before breakfast.    . Magnesium Oxide 500 MG TABS Take 500 mg by mouth 2 (two) times daily.     . metFORMIN (GLUCOPHAGE-XR) 500 MG 24 hr tablet Take 500 mg by mouth 2 (two) times daily with a meal.    . montelukast (SINGULAIR) 10 MG tablet Take 10 mg by mouth daily.    Marland Kitchen omega-3 acid ethyl esters (LOVAZA) 1 g capsule Take 1 g by mouth 2 (two) times daily.    . pravastatin (PRAVACHOL) 20 MG tablet Take 10 mg by mouth See admin instructions. Take 0.5 tablet (10 mg) by mouth in the evening on Mondays through Thursdays.    Marland Kitchen PROLIA 60 MG/ML SOSY injection Inject 60 mg into the vein every 6 (six) months.    . rivaroxaban (XARELTO) 20 MG TABS tablet Take 20 mg by mouth daily with supper.    . triamterene-hydrochlorothiazide (MAXZIDE-25)  37.5-25 MG tablet Take 1 tablet by mouth daily as needed (for fluid retention/swelling.).      No current facility-administered medications for this visit.     Physical Exam: Vitals:   01/18/18 1514  BP: 118/60  Pulse: 86  SpO2: 94%  Weight: 140 lb 6.4 oz (63.7 kg)  Height: 5\' 6"  (1.676 m)    GEN- The patient is well appearing, alert and oriented x 3 today.   Head- normocephalic, atraumatic Eyes-  Sclera clear, conjunctiva pink Ears- hearing intact Oropharynx- clear Lungs- Clear to ausculation bilaterally, normal work of breathing Heart- Regular rate and rhythm, no murmurs, rubs or gallops, PMI not laterally displaced GI- soft, NT, ND, + BS Extremities- no clubbing,  cyanosis, or edema  Wt Readings from Last 3 Encounters:  01/18/18 140 lb 6.4 oz (63.7 kg)  12/21/17 151 lb 3.8 oz (68.6 kg)  10/11/17 141 lb (64 kg)    EKG tracing ordered today is personally reviewed and shows sinus rhythm with first degree AV block (PR 280 msec), RBBB  2D echo 11/09/17- EF 45%, moderate LVH, mild LA enlargement, mild to moderate AI, mild MR, LA size 37mm  Assessment and Plan:  1. Atypical atrial flutter/ atrial fibrillation She has done very well post ablation.  She had atypical atrial flutter 8/19 for which she underwent cardioversion.  She has done reasonably well since.  She wore an event monitor which appears to have shown primarily sinus rhythm.   She is on xarelto.  Rate controlled during episodes (which are infrequent) Continue current approach.  I would not advise repeat ablation or consideration of AAD unless her arrhythmia burden substantially increases.  Given first degree AV block, our AAD options are limited. No changes today  Return in 6 months Follow-up with Dr Einar Gip as scheduled  Thompson Grayer MD, [facility] 01/18/2018 3:26 PM

## 2018-01-18 NOTE — Patient Instructions (Addendum)
Medication Instructions:  Your physician recommends that you continue on your current medications as directed. Please refer to the Current Medication list given to you today.   Labwork: None ordered   Testing/Procedures: None ordered   Follow-Up: Your physician wants you to follow-up in: 6 months with Dr. Allred    Any Other Special Instructions Will Be Listed Below (If Applicable).     If you need a refill on your cardiac medications before your next appointment, please call your pharmacy.   

## 2018-01-19 ENCOUNTER — Other Ambulatory Visit: Payer: Self-pay

## 2018-01-20 ENCOUNTER — Ambulatory Visit
Admission: RE | Admit: 2018-01-20 | Discharge: 2018-01-20 | Disposition: A | Discharge status: Home / Self Care | Payer: Medicare Other | Source: Ambulatory Visit | Attending: Gastroenterology | Admitting: Gastroenterology

## 2018-01-20 DIAGNOSIS — R7989 Other specified abnormal findings of blood chemistry: Secondary | ICD-10-CM

## 2018-01-20 DIAGNOSIS — K7689 Other specified diseases of liver: Secondary | ICD-10-CM | POA: Diagnosis not present

## 2018-01-20 DIAGNOSIS — R945 Abnormal results of liver function studies: Secondary | ICD-10-CM

## 2018-02-03 DIAGNOSIS — M810 Age-related osteoporosis without current pathological fracture: Secondary | ICD-10-CM | POA: Diagnosis not present

## 2018-02-08 DIAGNOSIS — N952 Postmenopausal atrophic vaginitis: Secondary | ICD-10-CM | POA: Diagnosis not present

## 2018-02-08 DIAGNOSIS — R3914 Feeling of incomplete bladder emptying: Secondary | ICD-10-CM | POA: Diagnosis not present

## 2018-02-08 DIAGNOSIS — N312 Flaccid neuropathic bladder, not elsewhere classified: Secondary | ICD-10-CM | POA: Diagnosis not present

## 2018-02-10 NOTE — H&P (Signed)
History of Present Illness  General:  77/female was admitted overnight in 10/19 for nausea, vomiting, diarrhea, presumed infectious, labs from 12/21/17 shoed normal renal function, anemia Hb 10.4, MCV 91.4, Plt 138. Colonoscopy in 2011(piecemeal remoavl of tubulovillous adenoma in 12/08) showed a 3 mm transverse(adenoma) colon polyp and diverticulosis. She had another spell of diarrhea last Thursday during an OV iwth her urologist and needed to get a shower as she has stool all over. Her liver enzymes were elevated on labs performed last Thrusday at [place] office. Today she feels ok. At times she feels that pills get stuck. She has hypothyroidism and A fib and is on xarelto.  It hurts to swallow certain food but denies food getting stuck. Denies weight loss of appetite. She has good appetite. She has some nausea , gets up every morning with nausea which improves as the day progresses. She denies vomiting. She reports not having regular BMs and has not had a BM since Thursday(4 days), 4 days ago she had a normal BM, continues to have multiple Bms, when she stood up she had stool in her pants and felt she had no control of her sphincter and then noticed the stool was all watery. Denies blood in stool or black stools. She states she had a colo guard testing 3 years ago and it was normal. There is no family history of colon cancer. No prior EGD.   Current Medications  Taking   Synthroid(L-Thyroxine Sodium) 200 MCG Tablet TAKE ONE TABLET BY MOUTH EVERY DAY Oral   Advair Diskus(Fluticasone-Salmeterol) 250-50 MCG/DOSE Aerosol Powder Breath Activated INHALE 1 PUFF BY MOUTH TWICE A DAY Inhalation   Xarelto(Rivaroxaban) 20 MG Tablet TAKE 1 TABLET EVERY EVENING WITH DINNER Oral   MetFORMIN HCl ER 500 MG Tablet Extended Release 24 Hour TAKE ONE TABLET BY MOUTH TWICE DAILY WITH MEALS Oral   Pravastatin Sodium 20 MG Tablet TAKE 1/2 TABLET BY MOUTH EVERY DAY MONDAY THROUGH THURSDAY Orally Mon-Thurs    Anastrozole 1 MG Tablet TAKE ONE TABLET BY MOUTH EVERY DAY Oral   Gabapentin 300 MG Capsule TAKE TWO CAPSULES BY MOUTH AT BEDTIME Oral   Vitamin B 12 as directed Orally Once a day   Vitamin D3 as directed Orally Once a day   Medication List reviewed and reconciled with the patient    Past Medical History  Lymphoma.   type II diabetes.   Breast CA.   Atrial fibrillation.   Hypertension.   Asthma.   Arthritis.   Thyroid disease.   Sleep apnea.    Surgical History  appendectomy   T&A   hysterectomy   back surgery x2   cholecystectomy   Cartaract surgery (both eyes)   bladder suspension, unspecified   bunionectomy (both feet)   right breast removal    Family History  Father: deceased  Mother: deceased  Neg family hx of colon cancer, polyps or liver disease.   Social History  General:  no Alcohol.  Tobacco use  cigarettes: Never smoked no Recreational drug use.    Allergies  Demerol: vomiting - Allergy  Morphine Sulfate: respiratory distress - Allergy  Penicillin: rash - Allergy  Sulfa: rash - Allergy  Multac: kidney bleeding - Allergy   Hospitalization/Major Diagnostic Procedure  Vomiting, diarrhea 11/2017   Review of Systems  CONSTITUTIONAL:  Chills No. Fatigue No. Fever No. Insomnia No. Night sweats No. Weight change No.  HEENT:  Change in vision No. Double vision No . Hoarseness No. Nose bleeds No. sore throat No.  Sinus Problems No. Glaucoma No.  CARDIOLOGY:  ByPass Surgery No. Poor Circulation No. Artificial Heart Valves No. High blood pressure No. History of Heart attack No. Irregular heart beat No. Known coronary artery disease No. Pacemaker/Defibrillator No.  RESPIRATORY:  Shortness of breath No. Cough No. Excessive Sputum No. Using Oxygen No. Asthma YES. Bronchitis No. Pneumonia No. Sleep Apnea YES.  UROLOGY:  Interstitials Cystitis No. Incontinence No. Blood in urine No. Difficulty urinating YES. Kidney disease No. Kidney stones No.   GASTROENTEROLOGY:  Abdominal pain No. Acid reflux No. Black stools No. Bloating/belching No. Change in bowel habits No. Constipation No. Dark tarry stools No. Diarrhea YES. Difficulty swallowing YES. Heartburn No. Incontinence of stool No. Indigestion: No. Lactose intolerance No. Nausea No. Rectal bleeding No. Vomiting No. Hepatitis/yellow jaundice No. History of Ulcers No. Use of Pain Medication No. Previous Colonoscopy No.  MUSCULOSKELETAL:  joint pain No. Arthritis YES. Joint Replacement No.  NEUROLOGY:  Dizziness No. Fainting No. Headache No. Paralysis No. Seizures No. Strokes No. Weakness No. Alzheimer's No.  SKIN:  Rash No. Bruises easily No.  ENDOCRINOLOGY:  Diabetes No. High cholesterol No. Thyroid disorder YES.  HEMATOLOGY/LYMPH:  Abnormal bleeding No. Anemia No. Enlarged lymph nodes No. Past blood transfusion No. Swollen glands No. Blood Clots No. Using Blood Thinners YES.  INFECTIOUS DISEASE:  HIV/AIDS No. Tuberculosis No . Hepatitis No. Sexually Transmitted Disease No. MRSA No.  GI PROCEDURE:  no Pacemaker/ AICD, no. no Artificial heart valves. no MI/heart attack. Abnormal heart rhythm YES, Atrial Fibrillation. no Angina. no CVA. Hypertension YES. no Hypotension. Asthma, COPD YES, asthma. no Sleep apnea. no Seizure disorders. no Artificial joints. no Severe DJD. Diabetes YES. no Significant headaches. no Vertigo. no Depression/anxiety. no Abnormal bleeding. no Kidney Disease. no Liver disease, no. no Chance of pregnancy. Blood transfusion yes. no Method of Birth Control. no Birth control pills.  GU/GYN:  Pregnant Now No. Trying to conceive No. Use Birth Control No. Heavy Periods No.       Vital Signs  Wt 136.9, Ht 65.5, BMI 22.43, Temp 97.4, Pulse sitting 85, BP sitting 113/73.   Examination  Gastroenterology:: GENERAL APPEARANCE: elderly,frail, no active distress, pleasant.  EYES: Lids and conjunctiva normal. Sclera normal.  ORAL CAVITY: Lips, teeth and gums are normal.  Pharynx, tongue, mucosa normal .  SCLERA: anicteric .  CARDIOVASCULAR RRR no murmur, Normal RRR w/o murmers or gallops. No peripheral edema .  RESPIRATORY Breath sounds normal. Respiration even and unlabored .  ABDOMEN No masses palpated. Liver and spleen not palpated, normal. Bowel sounds normal, Abdomen not distended .  EXTREMITIES: No edema, pulses intact .  NEURO: normal strength, normal gait .  PSYCH: mood/affect normal .     Assessments   1. Odynophagia - R13.10 (Primary)   2. History of adenomatous polyp of colon - Z86.010   3. Change in bowel habits - R19.4   4. Nausea - R11.0   5. Abnormal LFTs - R94.5   Treatment  1. Odynophagia  IMAGING: Esophagoscopy    Whitfield,Dia 01/09/2018 09:19:52 AM > spoke with Kendall-scheduled for 02/06/18 at [facility] at 9:30am-prep instructions reviewed with pt.   Notes: Proceed with a diagnostic endoscopy with biopsies and balloon dilatation if needed. The risks and benefits of the procedure were discussed with the patient in details, she verbalized understanding and consents. Will get an approval to hold Xarelto for at least 3 days from her cardiologist Dr. Einar Gip. .  Referral To: Reason:endo w/ dil-colon-propofol-spoke with Kendall-[facility]-#[id number]    2. History  of adenomatous polyp of colon  IMAGING: Colonoscopy    Whitfield,Dia 01/09/2018 09:19:52 AM > spoke with Kendall-scheduled for 02/06/18 at [facility] at 9:30am-prep instructions reviewed with pt.   Notes: Adenoma was removed in 2011.  Referral To: Reason:endo w/ dil-colon-propofol-spoke with Kendall-[facility]-#[id number]    3. Change in bowel habits  IMAGING: Colonoscopy    Whitfield,Dia 01/09/2018 09:19:52 AM > spoke with Kendall-scheduled for 02/06/18 at [facility] at 9:30am-prep instructions reviewed with pt.  IMAGING: Esophagoscopy    Whitfield,Dia 01/09/2018 09:19:52 AM > spoke with Kendall-scheduled for 02/06/18 at [facility] at 9:30am-prep instructions reviewed with pt.   Notes: Possible overflow incontinence  from underlying constipation. Advised patient to use bulk forming laxatives such as Metamucil one tablespoon in 8 ounces of water twice a day until she has a bowel movement, thereafter to continue once a day. Will obtain biopsies from random areas of the colon to rule out microscopic colitis.  Referral To: Reason:endo w/ dil-colon-propofol-spoke with Kendall-[facility]-#[id number]    4. Nausea  IMAGING: Esophagoscopy    Whitfield,Dia 01/09/2018 09:19:52 AM > spoke with Kendall-scheduled for 02/06/18 at [facility] at 9:30am-prep instructions reviewed with pt.   Notes: Could be related to underlying gastroparesis from diabetes or from possible underlying constipation.  Referral To: Reason:endo w/ dil-colon-propofol-spoke with Kendall-[facility]-#[id number]    5. Abnormal LFTs  LAB: Hepatic Panel Notes: Lab work from primary care physician's office not available, repeat hepatic panel, may need ultrasound to evaluate liver and biliary tree.    Procedures  Venipuncture:  Venipuncture: Williams,Kristy 01/09/2018 09:33:21 AM > , performed in right arm, number of attempts: 1.        Labs    Lab: Hepatic Panel  ALBUMIN 4.5  3.4-4.8 - g/dL  ALP 70  38-126 - U/L  ALT 48  0-52 - U/L  AST 21  0-39 - U/L  T.BILI 0.6  0.3-1.0 - mg/dL  D.BILI 0.1  0.0-0.2 - mg/dL  INDIRECT BILIRUBIN 0.4  0.3-0.7 - mg/dL  T PROTEIN 6.2  6.0-8.3 - g/dL   [facility] 01/09/2018 12:45:59 PM > normal liver enzymes, reassurance.   Ronnette Juniper, MD

## 2018-02-12 NOTE — Anesthesia Preprocedure Evaluation (Addendum)
Anesthesia Evaluation  Patient identified by MRN, date of birth, ID band Patient awake    Reviewed: Allergy & Precautions, NPO status , Patient's Chart, lab work & pertinent test results  History of Anesthesia Complications Negative for: history of anesthetic complications  Airway Mallampati: II  TM Distance: >3 FB Neck ROM: Full    Dental  (+) Dental Advisory Given, Partial Lower, Partial Upper   Pulmonary asthma , sleep apnea and Continuous Positive Airway Pressure Ventilation ,    breath sounds clear to auscultation       Cardiovascular hypertension, Pt. on medications + dysrhythmias Atrial Fibrillation + Valvular Problems/Murmurs AI  Rhythm:Irregular Rate:Normal   '19 TTE - EF 45%. Grade 2 diastolic dysfx. Moderate concentric LVH, mild global hypokinesis. Mild-mod AI. Mildly dilated LA. Mild MR and TR.    Neuro/Psych negative neurological ROS  negative psych ROS   GI/Hepatic negative GI ROS, Neg liver ROS,   Endo/Other  diabetes, Type 2, Oral Hypoglycemic AgentsHypothyroidism   Renal/GU CRFRenal disease     Musculoskeletal  (+) Arthritis ,   Abdominal   Peds  Hematology  (+) anemia ,  Non-Hodgkin's lymphoma    Anesthesia Other Findings   Reproductive/Obstetrics                            Anesthesia Physical Anesthesia Plan  ASA: III  Anesthesia Plan: MAC   Post-op Pain Management:    Induction: Intravenous  PONV Risk Score and Plan: 2 and Propofol infusion and Treatment may vary due to age or medical condition  Airway Management Planned: Nasal Cannula and Natural Airway  Additional Equipment: None  Intra-op Plan:   Post-operative Plan:   Informed Consent: I have reviewed the patients History and Physical, chart, labs and discussed the procedure including the risks, benefits and alternatives for the proposed anesthesia with the patient or authorized representative who  has indicated his/her understanding and acceptance.     Plan Discussed with: CRNA and Anesthesiologist  Anesthesia Plan Comments:        Anesthesia Quick Evaluation

## 2018-02-13 ENCOUNTER — Encounter (HOSPITAL_COMMUNITY)
Admission: RE | Disposition: A | Discharge status: Home / Self Care | Payer: Self-pay | Source: Home / Self Care | Attending: Gastroenterology

## 2018-02-13 ENCOUNTER — Ambulatory Visit (HOSPITAL_COMMUNITY): Payer: Medicare Other | Admitting: Anesthesiology

## 2018-02-13 ENCOUNTER — Encounter (HOSPITAL_COMMUNITY): Payer: Self-pay | Admitting: *Deleted

## 2018-02-13 ENCOUNTER — Other Ambulatory Visit: Payer: Self-pay

## 2018-02-13 ENCOUNTER — Ambulatory Visit (HOSPITAL_COMMUNITY)
Admission: RE | Admit: 2018-02-13 | Discharge: 2018-02-13 | Disposition: A | Discharge status: Home / Self Care | Payer: Medicare Other | Attending: Gastroenterology | Admitting: Gastroenterology

## 2018-02-13 DIAGNOSIS — K2950 Unspecified chronic gastritis without bleeding: Secondary | ICD-10-CM | POA: Insufficient documentation

## 2018-02-13 DIAGNOSIS — R945 Abnormal results of liver function studies: Secondary | ICD-10-CM | POA: Insufficient documentation

## 2018-02-13 DIAGNOSIS — I10 Essential (primary) hypertension: Secondary | ICD-10-CM | POA: Diagnosis not present

## 2018-02-13 DIAGNOSIS — Z86010 Personal history of colonic polyps: Secondary | ICD-10-CM | POA: Diagnosis not present

## 2018-02-13 DIAGNOSIS — Z7901 Long term (current) use of anticoagulants: Secondary | ICD-10-CM | POA: Diagnosis not present

## 2018-02-13 DIAGNOSIS — K5730 Diverticulosis of large intestine without perforation or abscess without bleeding: Secondary | ICD-10-CM | POA: Insufficient documentation

## 2018-02-13 DIAGNOSIS — E039 Hypothyroidism, unspecified: Secondary | ICD-10-CM | POA: Insufficient documentation

## 2018-02-13 DIAGNOSIS — K209 Esophagitis, unspecified: Secondary | ICD-10-CM | POA: Diagnosis not present

## 2018-02-13 DIAGNOSIS — B49 Unspecified mycosis: Secondary | ICD-10-CM | POA: Diagnosis not present

## 2018-02-13 DIAGNOSIS — G4730 Sleep apnea, unspecified: Secondary | ICD-10-CM | POA: Insufficient documentation

## 2018-02-13 DIAGNOSIS — K319 Disease of stomach and duodenum, unspecified: Secondary | ICD-10-CM | POA: Diagnosis not present

## 2018-02-13 DIAGNOSIS — R110 Nausea: Secondary | ICD-10-CM | POA: Diagnosis not present

## 2018-02-13 DIAGNOSIS — R194 Change in bowel habit: Secondary | ICD-10-CM | POA: Insufficient documentation

## 2018-02-13 DIAGNOSIS — I4891 Unspecified atrial fibrillation: Secondary | ICD-10-CM | POA: Insufficient documentation

## 2018-02-13 DIAGNOSIS — Z853 Personal history of malignant neoplasm of breast: Secondary | ICD-10-CM | POA: Insufficient documentation

## 2018-02-13 DIAGNOSIS — E119 Type 2 diabetes mellitus without complications: Secondary | ICD-10-CM | POA: Diagnosis not present

## 2018-02-13 DIAGNOSIS — J45909 Unspecified asthma, uncomplicated: Secondary | ICD-10-CM | POA: Insufficient documentation

## 2018-02-13 DIAGNOSIS — R1310 Dysphagia, unspecified: Secondary | ICD-10-CM | POA: Diagnosis not present

## 2018-02-13 DIAGNOSIS — K229 Disease of esophagus, unspecified: Secondary | ICD-10-CM | POA: Diagnosis not present

## 2018-02-13 DIAGNOSIS — Z7951 Long term (current) use of inhaled steroids: Secondary | ICD-10-CM | POA: Insufficient documentation

## 2018-02-13 DIAGNOSIS — Z7984 Long term (current) use of oral hypoglycemic drugs: Secondary | ICD-10-CM | POA: Insufficient documentation

## 2018-02-13 DIAGNOSIS — K317 Polyp of stomach and duodenum: Secondary | ICD-10-CM | POA: Diagnosis not present

## 2018-02-13 DIAGNOSIS — K644 Residual hemorrhoidal skin tags: Secondary | ICD-10-CM | POA: Diagnosis not present

## 2018-02-13 DIAGNOSIS — Z8572 Personal history of non-Hodgkin lymphomas: Secondary | ICD-10-CM | POA: Insufficient documentation

## 2018-02-13 DIAGNOSIS — B3781 Candidal esophagitis: Secondary | ICD-10-CM | POA: Insufficient documentation

## 2018-02-13 DIAGNOSIS — K3189 Other diseases of stomach and duodenum: Secondary | ICD-10-CM | POA: Diagnosis not present

## 2018-02-13 DIAGNOSIS — B379 Candidiasis, unspecified: Secondary | ICD-10-CM | POA: Diagnosis not present

## 2018-02-13 HISTORY — PX: BIOPSY: SHX5522

## 2018-02-13 HISTORY — PX: ESOPHAGOGASTRODUODENOSCOPY: SHX5428

## 2018-02-13 HISTORY — PX: POLYPECTOMY: SHX5525

## 2018-02-13 HISTORY — PX: COLONOSCOPY: SHX5424

## 2018-02-13 LAB — GLUCOSE, CAPILLARY: Glucose-Capillary: 113 mg/dL — ABNORMAL HIGH (ref 70–99)

## 2018-02-13 SURGERY — EGD (ESOPHAGOGASTRODUODENOSCOPY)
Anesthesia: Monitor Anesthesia Care

## 2018-02-13 MED ORDER — PROPOFOL 10 MG/ML IV BOLUS
INTRAVENOUS | Status: AC
  Filled 2018-02-13: qty 20

## 2018-02-13 MED ORDER — PROPOFOL 10 MG/ML IV BOLUS
INTRAVENOUS | Status: AC
  Filled 2018-02-13: qty 40

## 2018-02-13 MED ORDER — EPHEDRINE SULFATE-NACL 50-0.9 MG/10ML-% IV SOSY
PREFILLED_SYRINGE | INTRAVENOUS | Status: DC | PRN
  Administered 2018-02-13: 10 mg via INTRAVENOUS

## 2018-02-13 MED ORDER — PROPOFOL 10 MG/ML IV BOLUS
INTRAVENOUS | Status: DC | PRN
  Administered 2018-02-13 (×2): 20 mg via INTRAVENOUS

## 2018-02-13 MED ORDER — SODIUM CHLORIDE 0.9 % IV SOLN: INTRAVENOUS | Status: DC

## 2018-02-13 MED ORDER — PHENYLEPHRINE 40 MCG/ML (10ML) SYRINGE FOR IV PUSH (FOR BLOOD PRESSURE SUPPORT)
PREFILLED_SYRINGE | INTRAVENOUS | Status: DC | PRN
  Administered 2018-02-13 (×3): 80 ug via INTRAVENOUS
  Administered 2018-02-13: 40 ug via INTRAVENOUS

## 2018-02-13 MED ORDER — PROPOFOL 500 MG/50ML IV EMUL
INTRAVENOUS | Status: DC | PRN
  Administered 2018-02-13: 110 ug/kg/min via INTRAVENOUS

## 2018-02-13 MED ORDER — LACTATED RINGERS IV SOLN
INTRAVENOUS | Status: DC
  Administered 2018-02-13: 1000 mL via INTRAVENOUS

## 2018-02-13 NOTE — Op Note (Signed)
[facility] Patient Name: Patty [name] Procedure Date: 02/13/2018 MRN: [id number] Attending MD: Ronnette Juniper , MD Date of Birth: 1940/09/20 CSN: [id number] Age: 77 Admit Type: Outpatient Procedure:                Colonoscopy Indications:              Last colonoscopy: 2011, Change in bowel habits Providers:                Ronnette Juniper, MD, Elna Breslow, RN, Tinnie Gens,                            Technician, Carleene Cooper, CRNA Referring MD:              Medicines:                Monitored Anesthesia Care Complications:            No immediate complications. Estimated blood loss:                            None. Estimated Blood Loss:     Estimated blood loss was minimal. Procedure:                Pre-Anesthesia Assessment:                           - Prior to the procedure, a History and Physical                            was performed, and patient medications and                            allergies were reviewed. The patient's tolerance of                            previous anesthesia was also reviewed. The risks                            and benefits of the procedure and the sedation                            options and risks were discussed with the patient.                            All questions were answered, and informed consent                            was obtained. Prior Anticoagulants: The patient has                            taken Xarelto (rivaroxaban), last dose was 2 days                            prior to procedure. ASA Grade Assessment: III - A  patient with severe systemic disease. After                            reviewing the risks and benefits, the patient was                            deemed in satisfactory condition to undergo the                            procedure.                           - Prior to the procedure, a History and Physical                            was performed, and patient medications  and                            allergies were reviewed. The patient's tolerance of                            previous anesthesia was also reviewed. The risks                            and benefits of the procedure and the sedation                            options and risks were discussed with the patient.                            All questions were answered, and informed consent                            was obtained. Prior Anticoagulants: The patient has                            taken Xarelto (rivaroxaban), last dose was 2 days                            prior to procedure. ASA Grade Assessment: III - A                            patient with severe systemic disease. After                            reviewing the risks and benefits, the patient was                            deemed in satisfactory condition to undergo the                            procedure.  After obtaining informed consent, the colonoscope                            was passed under direct vision. Throughout the                            procedure, the patient's blood pressure, pulse, and                            oxygen saturations were monitored continuously. The                            PCF-H190DL ([id number] Olympus peds colonoscope was                            introduced through the anus and advanced to the the                            terminal ileum. The colonoscopy was performed                            without difficulty. The patient tolerated the                            procedure well. The quality of the bowel                            preparation was adequate to identify polyps 6 mm                            and larger in size. Scope In: 8:27:56 AM Scope Out: 8:52:39 AM Scope Withdrawal Time: 0 hours 13 minutes 58 seconds  Total Procedure Duration: 0 hours 24 minutes 43 seconds  Findings:      Skin tags were found on perianal exam.      A large amount of  liquid semi-liquid stool was found in the entire       colon, making visualization difficult. Lavage of the area was performed,       resulting in clearance with fair visualization. Biopsies for histology       were taken with a cold forceps from the random areas of right and left       colon for evaluation of microscopic colitis.      A few small and large-mouthed diverticula were found in the sigmoid       colon.      The terminal ileum appeared normal.      The retroflexed view of the distal rectum and anal verge was normal and       showed no anal or rectal abnormalities. Impression:               - Perianal skin tags found on perianal exam.                           - Stool in the entire examined colon. Biopsied.                           -  Diverticulosis in the sigmoid colon.                           - The examined portion of the ileum was normal.                           - The distal rectum and anal verge are normal on                            retroflexion view. Moderate Sedation:      Patient did not receive moderate sedation for this procedure, but       instead received monitored anesthesia care. Recommendation:           - Patient has a contact number available for                            emergencies. The signs and symptoms of potential                            delayed complications were discussed with the                            patient. Return to normal activities tomorrow.                            Written discharge instructions were provided to the                            patient.                           - Resume regular diet.                           - Continue present medications.                           - Await pathology results.                           - Repeat colonoscopy for surveillance based on                            pathology results.                           - Resume Xarelto (rivaroxaban) at prior dose                             tomorrow. Procedure Code(s):        --- Professional ---                           [phone], Colonoscopy, flexible; with biopsy, single                            or  multiple Diagnosis Code(s):        --- Professional ---                           K64.4, Residual hemorrhoidal skin tags                           R19.4, Change in bowel habit                           K57.30, Diverticulosis of large intestine without                            perforation or abscess without bleeding CPT copyright 2018 American [employer] Association. All rights reserved. The codes documented in this report are preliminary and upon coder review may  be revised to meet current compliance requirements. Ronnette Juniper, MD 02/13/2018 9:08:50 AM This report has been signed electronically. Number of Addenda: 0

## 2018-02-13 NOTE — Op Note (Signed)
[facility] Patient Name: Patty [name] Procedure Date: 02/13/2018 MRN: [id number] Attending MD: Ronnette Juniper , MD Date of Birth: 05-23-1940 CSN: [id number] Age: 77 Admit Type: Outpatient Procedure:                Upper GI endoscopy Indications:              Odynophagia, Nausea, change in bowel habits Providers:                Ronnette Juniper, MD, Elna Breslow, RN, Tinnie Gens,                            Technician, Courtney Heys. [place], CRNA Referring MD:              Medicines:                Monitored Anesthesia Care Complications:            No immediate complications. Estimated blood loss:                            Minimal. Estimated Blood Loss:     Estimated blood loss was minimal. Procedure:                Pre-Anesthesia Assessment:                           - Prior to the procedure, a History and Physical                            was performed, and patient medications and                            allergies were reviewed. The patient's tolerance of                            previous anesthesia was also reviewed. The risks                            and benefits of the procedure and the sedation                            options and risks were discussed with the patient.                            All questions were answered, and informed consent                            was obtained. Prior Anticoagulants: The patient has                            taken Xarelto (rivaroxaban), last dose was 2 days                            prior to procedure. ASA Grade Assessment: III - A  patient with severe systemic disease. After                            reviewing the risks and benefits, the patient was                            deemed in satisfactory condition to undergo the                            procedure.                           After obtaining informed consent, the endoscope was                            passed under direct vision.  Throughout the                            procedure, the patient's blood pressure, pulse, and                            oxygen saturations were monitored continuously. The                            GIF-H190 ([id number]) Olympus adult endoscope was                            introduced through the mouth, and advanced to the                            second part of duodenum. The upper GI endoscopy was                            accomplished without difficulty. The patient                            tolerated the procedure well. Scope In: Scope Out: Findings:      Patchy, white plaques were found in the upper third of the esophagus, in       the middle third of the esophagus and in the lower third of the       esophagus. Cells for cytology were obtained by brushing. Biopsies were       obtained from the proximal and distal esophagus with cold forceps for       histology of suspected eosinophilic esophagitis.      The Z-line was regular and was found 38 cm from the incisors.      Localized mildly erythematous mucosa without bleeding was found in the       gastric antrum. Biopsies were taken with a cold forceps for Helicobacter       pylori testing.      The cardia and gastric fundus were normal on retroflexion.      Two 5 mm sessile polyps with no bleeding and no stigmata of recent       bleeding were found in the gastric body. The polyp was removed with a  piecemeal technique using a cold biopsy forceps. Resection and retrieval       were complete.      The examined duodenum was normal. Biopsies for histology were taken with       a cold forceps for evaluation of celiac disease. Impression:               - Esophageal plaques were found, suspicious for                            candidiasis. Cells for cytology obtained. Biopsied.                           - Z-line regular, 38 cm from the incisors.                           - Erythematous mucosa in the antrum. Biopsied.                            - Two gastric polyps. Resected and retrieved.                           - Normal examined duodenum. Biopsied. Moderate Sedation:      Patient did not receive moderate sedation for this procedure, but       instead received monitored anesthesia care. Recommendation:           - Patient has a contact number available for                            emergencies. The signs and symptoms of potential                            delayed complications were discussed with the                            patient. Return to normal activities tomorrow.                            Written discharge instructions were provided to the                            patient.                           - Resume regular diet.                           - Continue present medications.                           - Await pathology results.                           - Resume Xarelto (rivaroxaban) at prior dose                            tomorrow. Procedure Code(s):        ---  Professional ---                           [phone], Esophagogastroduodenoscopy, flexible,                            transoral; with biopsy, single or multiple Diagnosis Code(s):        --- Professional ---                           K22.9, Disease of esophagus, unspecified                           K31.89, Other diseases of stomach and duodenum                           K31.7, Polyp of stomach and duodenum                           R13.10, Dysphagia, unspecified                           R11.0, Nausea CPT copyright 2018 American Medical Association. All rights reserved. The codes documented in this report are preliminary and upon coder review may  be revised to meet current compliance requirements. Ronnette Juniper, MD 02/13/2018 9:05:55 AM This report has been signed electronically. Number of Addenda: 0

## 2018-02-13 NOTE — Discharge Instructions (Signed)

## 2018-02-13 NOTE — Anesthesia Postprocedure Evaluation (Signed)
Anesthesia Post Note  Patient: Patty [name]  Procedure(s) Performed: ESOPHAGOGASTRODUODENOSCOPY (EGD) (N/A ) COLONOSCOPY (N/A ) BIOPSY ESOPHAGEAL BRUSHING     Patient location during evaluation: PACU Anesthesia Type: MAC Level of consciousness: awake and alert Pain management: pain level controlled Vital Signs Assessment: post-procedure vital signs reviewed and stable Respiratory status: spontaneous breathing, nonlabored ventilation and respiratory function stable Cardiovascular status: stable and blood pressure returned to baseline Anesthetic complications: no    Last Vitals:  Vitals:   02/13/18 0910 02/13/18 0920  BP: (!) 115/51 (!) 130/55  Pulse: (!) 46 (!) 46  Resp: (!) 21 12  Temp:    SpO2: 98% 99%    Last Pain:  Vitals:   02/13/18 0920  TempSrc:   PainSc: 0-No pain                 Audry Pili

## 2018-02-13 NOTE — Transfer of Care (Signed)
Immediate Anesthesia Transfer of Care Note  Patient: Patty [name]  Procedure(s) Performed: ESOPHAGOGASTRODUODENOSCOPY (EGD) (N/A ) COLONOSCOPY (N/A ) BIOPSY ESOPHAGEAL BRUSHING  Patient Location: PACU and Endoscopy Unit  Anesthesia Type:MAC  Level of Consciousness: awake, alert , oriented and patient cooperative  Airway & Oxygen Therapy: Patient Spontanous Breathing and Patient connected to nasal cannula oxygen  Post-op Assessment: Report given to RN, Post -op Vital signs reviewed and stable and Patient moving all extremities  Post vital signs: Reviewed and stable  Last Vitals:  Vitals Value Taken Time  BP 99/50 02/13/2018  9:00 AM  Temp 36.4 C 02/13/2018  9:00 AM  Pulse 70 02/13/2018  9:02 AM  Resp 20 02/13/2018  9:02 AM  SpO2 100 % 02/13/2018  9:02 AM  Vitals shown include unvalidated device data.  Last Pain:  Vitals:   02/13/18 0900  TempSrc: Oral  PainSc: 0-No pain         Complications: No apparent anesthesia complications

## 2018-02-13 NOTE — Brief Op Note (Signed)
02/13/2018  8:58 AM  PATIENT:  Patty [name]  77 y.o. female  PRE-OPERATIVE DIAGNOSIS:  Odynophagia, History of adenomatous polyp of colon  POST-OPERATIVE DIAGNOSIS:  EGD: duodenal bx, gastric bx, esophageal bx, esophageal brushings, gastric polyp removed Colon: tics, random colon bx  PROCEDURE:  Procedure(s): ESOPHAGOGASTRODUODENOSCOPY (EGD) (N/A) COLONOSCOPY (N/A) BIOPSY ESOPHAGEAL BRUSHING  SURGEON:  Surgeon(s) and Role:    Ronnette Juniper, MD - Primary  PHYSICIAN ASSISTANT:   ASSISTANTS: Elna Breslow, Earlie Raveling, Tech   ANESTHESIA:   MAC  EBL:  minimal   BLOOD ADMINISTERED:none  DRAINS: none   LOCAL MEDICATIONS USED:  NONE  SPECIMEN:  Biopsy / Limited Resection  DISPOSITION OF SPECIMEN:  PATHOLOGY  COUNTS:  YES  TOURNIQUET:  * No tourniquets in log *  DICTATION: .Dragon Dictation  PLAN OF CARE: Discharge to home after PACU  PATIENT DISPOSITION:  PACU - hemodynamically stable.   Delay start of Pharmacological VTE agent (>24hrs) due to surgical blood loss or risk of bleeding: yes

## 2018-02-13 NOTE — Interval H&P Note (Signed)
History and Physical Interval Note: 77/female is here for an EGD for odynophagia, change in bowel habits and nausea and colonoscopy for history of adenoma(last colonoscopy in 2011), change in bowel habits, last dose of Xarelto was 2 days ago.  02/13/2018 7:55 AM  Patty [name]  has presented today for EGD and colonoscopy, with the diagnosis of Odynophagia, History of adenomatous polyp of colon  The various methods of treatment have been discussed with the patient and family. After consideration of risks, benefits and other options for treatment, the patient has consented to  Procedure(s): ESOPHAGOGASTRODUODENOSCOPY (EGD) (N/A) BALLOON DILATION (N/A) COLONOSCOPY (N/A) as a surgical intervention .  The patient's history has been reviewed, patient examined, no change in status, stable for surgery.  I have reviewed the patient's chart and labs.  Questions were answered to the patient's satisfaction.     Ronnette Juniper

## 2018-02-15 ENCOUNTER — Encounter (HOSPITAL_COMMUNITY): Payer: Self-pay | Admitting: Gastroenterology

## 2018-02-17 DIAGNOSIS — I428 Other cardiomyopathies: Secondary | ICD-10-CM | POA: Diagnosis not present

## 2018-02-17 DIAGNOSIS — Z9889 Other specified postprocedural states: Secondary | ICD-10-CM | POA: Diagnosis not present

## 2018-02-17 DIAGNOSIS — I4892 Unspecified atrial flutter: Secondary | ICD-10-CM | POA: Diagnosis not present

## 2018-02-17 DIAGNOSIS — I480 Paroxysmal atrial fibrillation: Secondary | ICD-10-CM | POA: Diagnosis not present

## 2018-02-24 DIAGNOSIS — J4521 Mild intermittent asthma with (acute) exacerbation: Secondary | ICD-10-CM | POA: Diagnosis not present

## 2018-02-24 DIAGNOSIS — R05 Cough: Secondary | ICD-10-CM | POA: Diagnosis not present

## 2018-03-02 ENCOUNTER — Ambulatory Visit: Payer: Medicare Other | Admitting: Podiatry

## 2018-03-02 DIAGNOSIS — J4521 Mild intermittent asthma with (acute) exacerbation: Secondary | ICD-10-CM | POA: Diagnosis not present

## 2018-03-09 ENCOUNTER — Ambulatory Visit (INDEPENDENT_AMBULATORY_CARE_PROVIDER_SITE_OTHER): Payer: Medicare Other | Admitting: Obstetrics & Gynecology

## 2018-03-09 ENCOUNTER — Encounter: Payer: Self-pay | Admitting: Obstetrics & Gynecology

## 2018-03-09 VITALS — BP 124/78 | Ht 64.0 in | Wt 140.0 lb

## 2018-03-09 DIAGNOSIS — Z780 Asymptomatic menopausal state: Secondary | ICD-10-CM

## 2018-03-09 DIAGNOSIS — C50211 Malignant neoplasm of upper-inner quadrant of right female breast: Secondary | ICD-10-CM

## 2018-03-09 DIAGNOSIS — Z9289 Personal history of other medical treatment: Secondary | ICD-10-CM

## 2018-03-09 DIAGNOSIS — C8591 Non-Hodgkin lymphoma, unspecified, lymph nodes of head, face, and neck: Secondary | ICD-10-CM

## 2018-03-09 DIAGNOSIS — Z90710 Acquired absence of both cervix and uterus: Secondary | ICD-10-CM

## 2018-03-09 DIAGNOSIS — Z01419 Encounter for gynecological examination (general) (routine) without abnormal findings: Secondary | ICD-10-CM

## 2018-03-09 DIAGNOSIS — M810 Age-related osteoporosis without current pathological fracture: Secondary | ICD-10-CM

## 2018-03-09 DIAGNOSIS — Z170 Estrogen receptor positive status [ER+]: Secondary | ICD-10-CM

## 2018-03-09 NOTE — Progress Notes (Signed)
Patty [name] Apr 16, 1940 [id number]   History:    78 y.o. G2P2 Married  RP:  Established patient presenting  for annual gyn exam  HPI: Menopause.  No hormone replacement therapy.  Status post total abdominal hysterectomy with ovarian preservation.  Abstinent.  Malignant lymphoma 2015.  Right breast cancer 2017 on Arimidex.  Status post right mastectomy.  Diabetes mellitus.  Has neuropathies.  Has to catheterize her bladder twice a day.  On doxycycline to prevent bladder infections.  Atrial Fibrillation.  Had a Cardioversion 09/2016.  Planning another Ablation procedure.  Stays active, but has been fighting a cold since early 01/2018.  Enjoys reading.  Body mass index 24.03.  Osteoporosis on BD 2019.  Prolia x 07/2017.   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 DCIS    Past medical history,surgical history, family history and social history were all reviewed and documented in the EPIC chart.  Gynecologic History No LMP recorded. Patient has had a hysterectomy. Contraception: status post hysterectomy Last Pap: 12/2016. Results were: Negative Last mammogram: 04/2016. Results were: Negative Bone Density: 2019 Osteoporosis on Prolia x 07/2017 Colonoscopy: 2019  Obstetric History OB History  Gravida Para Term Preterm AB Living  _0 SAB TAB Ectopic Multiple Live Births               # Outcome Date GA Lbr Len/2nd Weight Sex Delivery Anes PTL Lv  2 Term           1 Term              ROS: A ROS was performed and pertinent positives and negatives are included in the history.  GENERAL: No fevers or chills. HEENT: No change in  vision, no earache, sore throat or sinus congestion. NECK: No pain or stiffness. CARDIOVASCULAR: No chest pain or pressure. No palpitations. PULMONARY: No shortness of breath, cough or wheeze. GASTROINTESTINAL: No abdominal pain, nausea, vomiting or diarrhea, melena or bright red blood per rectum. GENITOURINARY: No urinary frequency, urgency, hesitancy or dysuria. MUSCULOSKELETAL: No joint or muscle pain, no back pain, no recent trauma. DERMATOLOGIC: No rash, no itching, no lesions. ENDOCRINE: No polyuria, polydipsia, no heat or cold intolerance. No recent change in weight. HEMATOLOGICAL: No anemia or easy bruising or bleeding. NEUROLOGIC: No headache, seizures, numbness, tingling or weakness. PSYCHIATRIC: No depression, no loss of interest in normal activity or change in sleep pattern.     Exam:   Ht _1  (1.626 m)   Wt 140 lb (63.5 kg)   BMI 24.03 kg/m   Body mass index is 24.03 kg/m.  General appearance : Well developed well nourished female. No acute distress HEENT: Eyes: no retinal hemorrhage or exudates,  Neck supple, trachea midline, no carotid bruits, no thyroidmegaly Lungs: Clear to auscultation, no rhonchi or wheezes, or rib retractions  Heart: Regular rate and rhythm, no murmurs or gallops Breast:Examined in sitting and supine position were symmetrical in appearance, no palpable masses or tenderness,  no skin retraction, no nipple inversion, no nipple discharge, no skin discoloration, no axillary or supraclavicular lymphadenopathy Abdomen: no  palpable masses or tenderness, no rebound or guarding Extremities: no edema or skin discoloration or tenderness  Pelvic: Vulva: Normal             Vagina: No gross lesions or discharge  Cervix/Uterus absent  Adnexa  Without masses or tenderness  Anus: Normal   Assessment/Plan:  78 y.o. female for annual exam   1. Well female exam with routine gynecological exam Gynecologic exam status post TAH and menopause.  Pap test on November 2018  was negative.  No indication to repeat this year.  Breast exam normal.  Will schedule a screening mammogram when due.  Health labs with family physician.  Colonoscopy in 2019.  2. S/P TAH (total abdominal hysterectomy)  3. Postmenopausal Well on no hormone replacement therapy.  4. Age-related osteoporosis without current pathological fracture Osteoporosis diagnosed on bone density in 2019.  On Prolia since June 2019.  Vitamin D supplements, calcium intake of 1.5 g/day and regular weightbearing physical activities recommended.  5. Malignant neoplasm of upper-inner quadrant of right breast in female, estrogen receptor positive ([place]) Diagnosis in 2017.  Status post right mastectomy.  Currently treated with Arimidex.  6. 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, face, and neck ([place]) Diagnosis in 2015.  [name] MD, 10:21 AM 03/09/2018

## 2018-03-10 ENCOUNTER — Other Ambulatory Visit: Payer: Self-pay | Admitting: Internal Medicine

## 2018-03-10 DIAGNOSIS — Z1231 Encounter for screening mammogram for malignant neoplasm of breast: Secondary | ICD-10-CM

## 2018-03-15 ENCOUNTER — Encounter: Payer: Self-pay | Admitting: Internal Medicine

## 2018-03-15 ENCOUNTER — Ambulatory Visit (INDEPENDENT_AMBULATORY_CARE_PROVIDER_SITE_OTHER): Payer: Medicare Other | Admitting: Internal Medicine

## 2018-03-15 VITALS — BP 126/88 | HR 102 | Ht 64.0 in | Wt 144.6 lb

## 2018-03-15 DIAGNOSIS — I4891 Unspecified atrial fibrillation: Secondary | ICD-10-CM | POA: Diagnosis not present

## 2018-03-15 DIAGNOSIS — I484 Atypical atrial flutter: Secondary | ICD-10-CM | POA: Diagnosis not present

## 2018-03-15 MED ORDER — METOPROLOL TARTRATE 50 MG PO TABS: 50.0000 mg | ORAL_TABLET | Freq: Once | ORAL | 0 refills | Status: DC

## 2018-03-15 NOTE — Progress Notes (Signed)
Electrophysiology Office Note Date: 03/15/2018  ID:  Patty [name], Patty [name] [birth year], MRN [id number]  PCP: Deland Pretty, MD Primary Cardiologist: Dr Einar Gip Electrophysiologist: Dr Rayann Heman  CC: Follow up for atrial fibrillation/flutter  Patty [name] is a 78 y.o. female seen today for electrophysiology followup.  Since last being seen in our clinic, the patient reports that she has had more fatigue associated with her heart racing.  She does admit that she has recently had an URI which has aggravated her heart racing symptoms. She is in atrial flutter today.  She denies chest pain, PND, orthopnea, nausea, vomiting, dizziness, syncope, edema, weight gain, or early satiety.  Past Medical History:  Diagnosis Date  . Arthritis   . Asthma   . Atrial flutter (HCC)    PAROXYSMAL, EKG 10/06/2017, ATRIAL FLUTTER (ATYPICAL) WITH VARIABLE VENTRICULAR RESPONCE AND CONTROLLED  . Breast cancer, right ([place]) 2/17   R sided, s/p total mastectomy  . Bulging lumbar disc    3  . Chronic kidney disease    recurring UTI  . Complication of anesthesia    "morphine made me stop breathing"  . Concentric left ventricular hypertrophy    MILD DECREASE IN GLOBAL WALL MOTION. DOPPLER EVIDENCE OF GRADE II (PSEUDONORMAL) DIASTOLIC DYSFUNCTION, ELEVATED LAP, CALCULATED EF 45%, LEFT ATRIAL CAVITY IS MILDLY DILATED. MILD TO MODERATE AORTIC REGURGITATION. MILD (GRADE I) MITRAL REGURGITATION. MILD TRICUSPID REGURGITATION. ESTIMATED PULMONARY ARTERY SYSTOLIC PRESSURE 22 MMHG.  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 First degree AV block   . HCAP (healthcare-associated pneumonia) 12/2013   Archie Endo 01/18/2014  . History of skin cancer   . History of transfusion   . Hypertension   . Hypothyroidism   . Left atrial enlargement   . Leg swelling   . 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 Malaise and fatigue   . Nocturia   . Paroxysmal atrial  fibrillation (HCC)   . Persistent atrial fibrillation   . Pure hypercholesterolemia   . Shortness of breath dyspnea    "at times"  . Skin cancer   . Sleep apnea    uses c-pap  . Type II diabetes mellitus ([place])    Past Surgical History:  Procedure Laterality Date  . ABDOMINAL HYSTERECTOMY  1972   partial  . ABDOMINAL HYSTERECTOMY    . APPENDECTOMY  1953  . ATRIAL FIBRILLATION ABLATION  08/19/2015   PVI with CTI ablation by Dr Rayann Heman  . BACK SURGERY     x2  . BIOPSY  02/13/2018   Procedure: BIOPSY;  Surgeon: Ronnette Juniper, MD;  Location: [facility];  Service: Gastroenterology;;  X2=EGD & Colon  . BREAST SURGERY     Right mastectomy  . CARDIAC CATHETERIZATION  10/10/2008   Nonobstructive CAD  . CARDIOVERSION  04/13/2011   Procedure: CARDIOVERSION;  Surgeon: Laverda Page, MD;  Location: [place];  Service: Cardiovascular;  Laterality: N/A;  . CARDIOVERSION N/A 06/18/2015   Procedure: CARDIOVERSION;  Surgeon: Adrian Prows, MD;  Location[facility] END[facility]COPY;  Service: Cardiovascular;  Laterality: N/A;  . CARDIOVERSION N/A 10/26/2016   Procedure: CARDIOVERSION;  Surgeon: Adrian Prows, MD;  Location: [facility] ENDOSCOPY;  Service: Cardiovascular;  Laterality: N/A;  . CARDIOVERSION N/A 10/11/2017   Procedure: CARDIOVERSION;  Surgeon: Nigel Mormon, MD;  Location: [place];  Service: Cardiovascular;  Laterality: N/A;  . CATARACT EXTRACTION W/ INTRAOCULAR LENS  IMPLANT, BILATERAL Bilateral   . CHOLECYSTECTOMY  [place] N/A 02/13/2018   Procedure: COLONOSCOPY;  Surgeon: Ronnette Juniper, MD;  Location: [facility];  Service: Gastroenterology;  Laterality: N/A;  . ELECTROPHYSIOLOGIC STUDY N/A 08/19/2015   Procedure: Atrial Fibrillation Ablation;  Surgeon: Thompson Grayer, MD;  Location: [place] [facility];  Service: Cardiovascular;  Laterality: N/A;  . ESOPHAGOGASTRODUODENOSCOPY N/A 02/13/2018   Procedure: ESOPHAGOGASTRODUODENOSCOPY (EGD);  Surgeon: Ronnette Juniper, MD;  Location: [facility];   Service: Gastroenterology;  Laterality: N/A;  . FOOT SURGERY    . IR REMOVAL TUN ACCESS W/ PORT W/O FL MOD SED  04/01/2017  . LUMBAR LAMINECTOMY    . MASS BIOPSY Left 12/07/2013   Procedure: EXCISIONAL BIOPSY LEFT NECK MASS ;  Surgeon: Jerrell Belfast, MD;  Location: [place];  Service: ENT;  Laterality: Left;  Marland Kitchen MASTECTOMY Right 2017   malignant  . POLYPECTOMY  02/13/2018   Procedure: POLYPECTOMY;  Surgeon: Ronnette Juniper, MD;  Location: [facility];  Service: Gastroenterology;;  EGD  . PORTACATH PLACEMENT  12/2013   Archie Endo 01/04/2014  . PUBOVAGINAL SLING    . SKIN TAG REMOVAL  2012   leg  . TEE WITHOUT CARDIOVERSION N/A 08/19/2015   Procedure: TRANSESOPHAGEAL ECHOCARDIOGRAM (TEE);  Surgeon: Larey Dresser, MD;  Location: [facility];  Service: Cardiovascular;  Laterality: N/A;  . TONSILLECTOMY    . TOTAL MASTECTOMY Right 05/27/2015   Procedure: RIGHT TOTAL MASTECTOMY;  Surgeon: Fanny Skates, MD;  Location: [facility];  Service: General;  Laterality: Right;    Current Outpatient Medications  Medication Sig Dispense Refill  . albuterol (PROVENTIL HFA;VENTOLIN HFA) 108 (90 Base) MCG/ACT inhaler Inhale 1-2 puffs into the lungs every 6 (six) hours as needed for wheezing or shortness of breath.     . anastrozole (ARIMIDEX) 1 MG tablet TAKE ONE TABLET BY MOUTH EVERY DAY (Patient taking differently: Take 1 mg by mouth daily. ) 90 tablet 9  . Cholecalciferol (VITAMIN D) 2000 UNITS tablet Take 2,000 Units by mouth daily with lunch.    . Cyanocobalamin (B-12) 2500 MCG TABS Take 2,500 mcg by mouth daily with lunch.     . Fluticasone-Salmeterol (ADVAIR) 250-50 MCG/DOSE AEPB Inhale 1 puff into the lungs every 12 (twelve) hours.     . gabapentin (NEURONTIN) 300 MG capsule Take 300 mg by mouth at bedtime.     Marland Kitchen levothyroxine (SYNTHROID, LEVOTHROID) 200 MCG tablet Take 200 mcg by mouth daily before breakfast.    . Magnesium Oxide 500 MG TABS Take 500 mg by mouth 2 (two) times daily.     . metFORMIN  (GLUCOPHAGE-XR) 500 MG 24 hr tablet Take 500 mg by mouth 2 (two) times daily with a meal.    . montelukast (SINGULAIR) 10 MG tablet Take 10 mg by mouth daily.    Marland Kitchen omega-3 acid ethyl esters (LOVAZA) 1 g capsule Take 1 g by mouth 2 (two) times daily.    . pravastatin (PRAVACHOL) 40 MG tablet Take 20 mg by mouth See admin instructions. Take 0.5 tablet (20 mg) by mouth on Mondays through Thursdays.  0  . PROLIA 60 MG/ML SOSY injection Inject 60 mg into the vein every 6 (six) months.    . rivaroxaban (XARELTO) 20 MG TABS tablet Take 20 mg by mouth daily with supper.    . traMADol-acetaminophen (ULTRACET) 37.5-325 MG tablet Take 1 tablet by mouth every 6 (six) hours as needed (pain).    . triamterene-hydrochlorothiazide (MAXZIDE-25) 37.5-25 MG tablet Take 1 tablet by mouth daily as needed (for fluid retention/swelling.).     Marland Kitchen  metoprolol tartrate (LOPRESSOR) 50 MG tablet Take 1 tablet (50 mg total) by mouth once for 1 dose. 1 tablet 0   No current facility-administered medications for this visit.     Allergies:   Morphine and related; Multaq [dronedarone]; Demerol; Oysters [shellfish allergy]; Penicillins; and Sulfa drugs cross reactors   Social History: Social History   Socioeconomic History  . Marital status: Married    Spouse name: Not on file  . Number of children: 2  . Years of education: Not on file  . Highest education level: Not on file  Occupational History  . Not on file  Social Needs  . Financial resource strain: Not on file  . Food insecurity:    Worry: Not on file    Inability: Not on file  . Transportation needs:    Medical: Not on file    Non-medical: Not on file  Tobacco Use  . Smoking status: Never Smoker  . Smokeless tobacco: Never Used  Substance and Sexual Activity  . Alcohol use: No    Alcohol/week: 0.0 standard drinks  . Drug use: No  . Sexual activity: Not Currently    Birth control/protection: Surgical  Lifestyle  . Physical activity:    Days per week:  Not on file    Minutes per session: Not on file  . Stress: Not on file  Relationships  . Social connections:    Talks on phone: Not on file    Gets together: Not on file    Attends religious service: Not on file    Active member of club or organization: Not on file    Attends meetings of clubs or organizations: Not on file    Relationship status: Not on file  . Intimate partner violence:    Fear of current or ex partner: Not on file    Emotionally abused: Not on file    Physically abused: Not on file    Forced sexual activity: Not on file  Other Topics Concern  . Not on file  Social History Narrative   Pt lives in [place] with spouse.   Retired Therapist, sports.  Previously worked in [employer].    Family History: Family History  Problem Relation Age of Onset  . Stroke Father 30  . Heart attack Father 3  . Hypertension Father   . Heart disease Mother   . Arthritis Mother   . Breast cancer Mother 23  . Heart failure Mother   . Heart attack Brother   . Hypertension Brother   . Diabetes Sister   . Cancer Sister 27    Review of Systems: All other systems reviewed and are otherwise negative except as noted above.   Physical Exam: VS:  BP 126/88   Pulse (!) 102   Ht 5\' 4"  (1.626 m)   Wt 144 lb 9.6 oz (65.6 kg)   SpO2 98%   BMI 24.82 kg/m  , BMI Body mass index is 24.82 kg/m. Wt Readings from Last 3 Encounters:  03/15/18 144 lb 9.6 oz (65.6 kg)  03/09/18 140 lb (63.5 kg)  02/13/18 140 lb (63.5 kg)    GEN- The patient is well appearing, alert and oriented x 3 today.   HEENT: normocephalic, atraumatic; sclera clear, conjunctiva pink; hearing intact; oropharynx clear; neck supple, no JVP Lymph- no cervical lymphadenopathy Lungs- Clear to ausculation bilaterally, normal work of breathing.  No wheezes, rales, rhonchi Heart- Regular rate and rhythm, no murmurs, rubs or gallops, PMI not laterally displaced GI- soft, non-tender,  non-distended, bowel sounds present, no  hepatosplenomegaly Extremities- no clubbing, cyanosis, or edema; DP/PT/radial pulses 2+ bilaterally MS- no significant deformity or atrophy Skin- warm and dry, no rash or lesion  Psych- euthymic mood, full affect Neuro- strength and sensation are intact   EKG:  EKG is ordered today. The ekg ordered today shows atypical atrial flutter HR 102, inc RBBB, PVC, LAFB, QRS 92, QTc 482  Recent Labs: 12/20/2017: ALT 50 12/21/2017: BUN 12; Creatinine, Ser 0.67; Hemoglobin 10.4; Platelets 138; Potassium 4.4; Sodium 137    Other studies Reviewed: Additional studies/ records that were reviewed today include: Dr Cherly Anderson notes  Assessment and Plan:  1. Persistent Atypical atrial flutter/atrial fibrillation She had done well previously post ablation. Has had more fatigue and shortness of breath over the last several weeks. She is in atypical atrial flutter today. We discussed a rate control strategy given we are limited on AAD and her advanced age. She feels that she has much more energy when she is in SR and wishes to pursue a rhythm strategy. If AAD therapy is needed in the future after redo ablation, Tikosyn would be an appropriate option.  Therapeutic strategies for atrial flutter including medicine and ablation were discussed in detail with the patient today. Risk, benefits, and alternatives to EP study and radiofrequency ablation for afib were also discussed in detail today. These risks include but are not limited to stroke, bleeding, vascular damage, tamponade, perforation, damage to the esophagus, lungs, and other structures, pulmonary vein stenosis, worsening renal function, and death. The patient understands these risk and wishes to proceed. Will cardiac CT prior to ablation.  Continue Xarelto 20 mg daily, continue metoprolol.   Current medicines are reviewed at length with the patient today.   The patient does not have concerns regarding her medicines.  The following changes were made today:   none  Labs/ tests ordered today include:  Orders Placed This Encounter  Procedures  . CT CARDIAC MORPH/PULM VEIN W/CM&W/O CA SCORE  . CT CORONARY FRACTIONAL FLOW RESERVE DATA PREP  . CT CORONARY FRACTIONAL FLOW RESERVE FLUID ANALYSIS  . CBC w/Diff  . Basic Metabolic Panel (BMET)  . EKG 12-Lead    Disposition:   Follow up with Afib clinic and Dr Rayann Heman post procedure.   Army Fossa MD 03/15/2018 1:04 PM   [address] [place] [id number] [phone] (office) [phone] (fax)

## 2018-03-15 NOTE — H&P (View-Only) (Signed)
Electrophysiology Office Note Date: 03/15/2018  ID:  Patty [name], Patty [name] 1940-03-05, MRN [id number]  PCP: Deland Pretty, MD Primary Cardiologist: Dr Einar Gip Electrophysiologist: Dr Rayann Heman  CC: Follow up for atrial fibrillation/flutter  Patty [name] is a 78 y.o. female seen today for electrophysiology followup.  Since last being seen in our clinic, the patient reports that she has had more fatigue associated with her heart racing.  She does admit that she has recently had an URI which has aggravated her heart racing symptoms. She is in atrial flutter today.  She denies chest pain, PND, orthopnea, nausea, vomiting, dizziness, syncope, edema, weight gain, or early satiety.  Past Medical History:  Diagnosis Date  . Arthritis   . Asthma   . Atrial flutter (HCC)    PAROXYSMAL, EKG 10/06/2017, ATRIAL FLUTTER (ATYPICAL) WITH VARIABLE VENTRICULAR RESPONCE AND CONTROLLED  . 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 Bulging lumbar disc    3  . Chronic kidney disease    recurring UTI  . Complication of anesthesia    "morphine made me stop breathing"  . Concentric left ventricular hypertrophy    MILD DECREASE IN GLOBAL WALL MOTION. DOPPLER EVIDENCE OF GRADE II (PSEUDONORMAL) DIASTOLIC DYSFUNCTION, ELEVATED LAP, CALCULATED EF 45%, LEFT ATRIAL CAVITY IS MILDLY DILATED. MILD TO MODERATE AORTIC REGURGITATION. MILD (GRADE I) MITRAL REGURGITATION. MILD TRICUSPID REGURGITATION. ESTIMATED PULMONARY ARTERY SYSTOLIC PRESSURE 22 MMHG.  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 First degree AV block   . HCAP (healthcare-associated pneumonia) 12/2013   Archie Endo 01/18/2014  . History of skin cancer   . History of transfusion   . Hypertension   . Hypothyroidism   . Left atrial enlargement   . Leg swelling   . 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 Malaise and fatigue   . Nocturia   . Paroxysmal atrial  fibrillation (HCC)   . Persistent atrial fibrillation   . Pure hypercholesterolemia   . Shortness of breath dyspnea    "at times"  . Skin cancer   . Sleep apnea    uses c-pap  . Type II diabetes mellitus ([place])    Past Surgical History:  Procedure Laterality Date  . ABDOMINAL HYSTERECTOMY  1972   partial  . ABDOMINAL HYSTERECTOMY    . APPENDECTOMY  1953  . ATRIAL FIBRILLATION ABLATION  08/19/2015   PVI with CTI ablation by Dr Rayann Heman  . BACK SURGERY     x2  . BIOPSY  02/13/2018   Procedure: BIOPSY;  Surgeon: Ronnette Juniper, MD;  Location: [facility];  Service: Gastroenterology;;  X2=EGD & Colon  . BREAST SURGERY     Right mastectomy  . CARDIAC CATHETERIZATION  10/10/2008   Nonobstructive CAD  . CARDIOVERSION  04/13/2011   Procedure: CARDIOVERSION;  Surgeon: Laverda Page, MD;  Location: [place];  Service: Cardiovascular;  Laterality: N/A;  . CARDIOVERSION N/A 06/18/2015   Procedure: CARDIOVERSION;  Surgeon: Adrian Prows, MD;  Location: [facility] ENDOSCOPY;  Service: Cardiovascular;  Laterality: N/A;  . CARDIOVERSION N/A 10/26/2016   Procedure: CARDIOVERSION;  Surgeon: Adrian Prows, MD;  Location: [facility] ENDOSCOPY;  Service: Cardiovascular;  Laterality: N/A;  . CARDIOVERSION N/A 10/11/2017   Procedure: CARDIOVERSION;  Surgeon: Nigel Mormon, MD;  Location: [place];  Service: Cardiovascular;  Laterality: N/A;  . CATARACT EXTRACTION W/ INTRAOCULAR LENS  IMPLANT, BILATERAL Bilateral   . CHOLECYSTECTOMY  [place] N/A 02/13/2018   Procedure: COLONOSCOPY;  Surgeon: Ronnette Juniper, MD;  Location: [facility] ENDOSCOPY;  Service: Gastroenterology;  Laterality: N/A;  . ELECTROPHYSIOLOGIC STUDY N/A 08/19/2015   Procedure: Atrial Fibrillation Ablation;  Surgeon: Thompson Grayer, MD;  Location: [place] CV LAB;  Service: Cardiovascular;  Laterality: N/A;  . ESOPHAGOGASTRODUODENOSCOPY N/A 02/13/2018   Procedure: ESOPHAGOGASTRODUODENOSCOPY (EGD);  Surgeon: Ronnette Juniper, MD;  Location: [facility];   Service: Gastroenterology;  Laterality: N/A;  . FOOT SURGERY    . IR REMOVAL TUN ACCESS W/ PORT W/O FL MOD SED  04/01/2017  . LUMBAR LAMINECTOMY    . MASS BIOPSY Left 12/07/2013   Procedure: EXCISIONAL BIOPSY LEFT NECK MASS ;  Surgeon: Jerrell Belfast, MD;  Location: [place];  Service: ENT;  Laterality: Left;  Marland Kitchen MASTECTOMY Right 2017   malignant  . POLYPECTOMY  02/13/2018   Procedure: POLYPECTOMY;  Surgeon: Ronnette Juniper, MD;  Location: [facility];  Service: Gastroenterology;;  EGD  . PORTACATH PLACEMENT  12/2013   Archie Endo 01/04/2014  . PUBOVAGINAL SLING    . SKIN TAG REMOVAL  2012   leg  . TEE WITHOUT CARDIOVERSION N/A 08/19/2015   Procedure: TRANSESOPHAGEAL ECHOCARDIOGRAM (TEE);  Surgeon: Larey Dresser, MD;  Location: [place];  Service: Cardiovascular;  Laterality: N/A;  . TONSILLECTOMY    . TOTAL MASTECTOMY Right 05/27/2015   Procedure: RIGHT TOTAL MASTECTOMY;  Surgeon: Fanny Skates, MD;  Location: [facility];  Service: General;  Laterality: Right;    Current Outpatient Medications  Medication Sig Dispense Refill  . albuterol (PROVENTIL HFA;VENTOLIN HFA) 108 (90 Base) MCG/ACT inhaler Inhale 1-2 puffs into the lungs every 6 (six) hours as needed for wheezing or shortness of breath.     . anastrozole (ARIMIDEX) 1 MG tablet TAKE ONE TABLET BY MOUTH EVERY DAY (Patient taking differently: Take 1 mg by mouth daily. ) 90 tablet 9  . Cholecalciferol (VITAMIN D) 2000 UNITS tablet Take 2,000 Units by mouth daily with lunch.    . Cyanocobalamin (B-12) 2500 MCG TABS Take 2,500 mcg by mouth daily with lunch.     . Fluticasone-Salmeterol (ADVAIR) 250-50 MCG/DOSE AEPB Inhale 1 puff into the lungs every 12 (twelve) hours.     . gabapentin (NEURONTIN) 300 MG capsule Take 300 mg by mouth at bedtime.     Marland Kitchen levothyroxine (SYNTHROID, LEVOTHROID) 200 MCG tablet Take 200 mcg by mouth daily before breakfast.    . Magnesium Oxide 500 MG TABS Take 500 mg by mouth 2 (two) times daily.     . metFORMIN  (GLUCOPHAGE-XR) 500 MG 24 hr tablet Take 500 mg by mouth 2 (two) times daily with a meal.    . montelukast (SINGULAIR) 10 MG tablet Take 10 mg by mouth daily.    Marland Kitchen omega-3 acid ethyl esters (LOVAZA) 1 g capsule Take 1 g by mouth 2 (two) times daily.    . pravastatin (PRAVACHOL) 40 MG tablet Take 20 mg by mouth See admin instructions. Take 0.5 tablet (20 mg) by mouth on Mondays through Thursdays.  0  . PROLIA 60 MG/ML SOSY injection Inject 60 mg into the vein every 6 (six) months.    . rivaroxaban (XARELTO) 20 MG TABS tablet Take 20 mg by mouth daily with supper.    . traMADol-acetaminophen (ULTRACET) 37.5-325 MG tablet Take 1 tablet by mouth every 6 (six) hours as needed (pain).    . triamterene-hydrochlorothiazide (MAXZIDE-25) 37.5-25 MG tablet Take 1 tablet by mouth daily as needed (for fluid retention/swelling.).     Marland Kitchen  metoprolol tartrate (LOPRESSOR) 50 MG tablet Take 1 tablet (50 mg total) by mouth once for 1 dose. 1 tablet 0   No current facility-administered medications for this visit.     Allergies:   Morphine and related; Multaq [dronedarone]; Demerol; Oysters [shellfish allergy]; Penicillins; and Sulfa drugs cross reactors   Social History: Social History   Socioeconomic History  . Marital status: Married    Spouse name: Not on file  . Number of children: 2  . Years of education: Not on file  . Highest education level: Not on file  Occupational History  . Not on file  Social Needs  . Financial resource strain: Not on file  . Food insecurity:    Worry: Not on file    Inability: Not on file  . Transportation needs:    Medical: Not on file    Non-medical: Not on file  Tobacco Use  . Smoking status: Never Smoker  . Smokeless tobacco: Never Used  Substance and Sexual Activity  . Alcohol use: No    Alcohol/week: 0.0 standard drinks  . Drug use: No  . Sexual activity: Not Currently    Birth control/protection: Surgical  Lifestyle  . Physical activity:    Days per week:  Not on file    Minutes per session: Not on file  . Stress: Not on file  Relationships  . Social connections:    Talks on phone: Not on file    Gets together: Not on file    Attends religious service: Not on file    Active member of club or organization: Not on file    Attends meetings of clubs or organizations: Not on file    Relationship status: Not on file  . Intimate partner violence:    Fear of current or ex partner: Not on file    Emotionally abused: Not on file    Physically abused: Not on file    Forced sexual activity: Not on file  Other Topics Concern  . Not on file  Social History Narrative   Pt lives in [place] with spouse.   Retired Therapist, sports.  Previously worked in [employer].    Family History: Family History  Problem Relation Age of Onset  . Stroke Father 10  . Heart attack Father 40  . Hypertension Father   . Heart disease Mother   . Arthritis Mother   . Breast cancer Mother 59  . Heart failure Mother   . Heart attack Brother   . Hypertension Brother   . Diabetes Sister   . Cancer Sister 70    Review of Systems: All other systems reviewed and are otherwise negative except as noted above.   Physical Exam: VS:  BP 126/88   Pulse (!) 102   Ht 5\' 4"  (1.626 m)   Wt 144 lb 9.6 oz (65.6 kg)   SpO2 98%   BMI 24.82 kg/m  , BMI Body mass index is 24.82 kg/m. Wt Readings from Last 3 Encounters:  03/15/18 144 lb 9.6 oz (65.6 kg)  03/09/18 140 lb (63.5 kg)  02/13/18 140 lb (63.5 kg)    GEN- The patient is well appearing, alert and oriented x 3 today.   HEENT: normocephalic, atraumatic; sclera clear, conjunctiva pink; hearing intact; oropharynx clear; neck supple, no JVP Lymph- no cervical lymphadenopathy Lungs- Clear to ausculation bilaterally, normal work of breathing.  No wheezes, rales, rhonchi Heart- Regular rate and rhythm, no murmurs, rubs or gallops, PMI not laterally displaced GI- soft, non-tender,  non-distended, bowel sounds present, no  hepatosplenomegaly Extremities- no clubbing, cyanosis, or edema; DP/PT/radial pulses 2+ bilaterally MS- no significant deformity or atrophy Skin- warm and dry, no rash or lesion  Psych- euthymic mood, full affect Neuro- strength and sensation are intact   EKG:  EKG is ordered today. The ekg ordered today shows atypical atrial flutter HR 102, inc RBBB, PVC, LAFB, QRS 92, QTc 482  Recent Labs: 12/20/2017: ALT 50 12/21/2017: BUN 12; Creatinine, Ser 0.67; Hemoglobin 10.4; Platelets 138; Potassium 4.4; Sodium 137    Other studies Reviewed: Additional studies/ records that were reviewed today include: Dr Cherly Anderson notes  Assessment and Plan:  1. Persistent Atypical atrial flutter/atrial fibrillation She had done well previously post ablation. Has had more fatigue and shortness of breath over the last several weeks. She is in atypical atrial flutter today. We discussed a rate control strategy given we are limited on AAD and her advanced age. She feels that she has much more energy when she is in SR and wishes to pursue a rhythm strategy. If AAD therapy is needed in the future after redo ablation, Tikosyn would be an appropriate option.  Therapeutic strategies for atrial flutter including medicine and ablation were discussed in detail with the patient today. Risk, benefits, and alternatives to EP study and radiofrequency ablation for afib were also discussed in detail today. These risks include but are not limited to stroke, bleeding, vascular damage, tamponade, perforation, damage to the esophagus, lungs, and other structures, pulmonary vein stenosis, worsening renal function, and death. The patient understands these risk and wishes to proceed. Will cardiac CT prior to ablation.  Continue Xarelto 20 mg daily, continue metoprolol.   Current medicines are reviewed at length with the patient today.   The patient does not have concerns regarding her medicines.  The following changes were made today:   none  Labs/ tests ordered today include:  Orders Placed This Encounter  Procedures  . CT CARDIAC MORPH/PULM VEIN W/CM&W/O CA SCORE  . CT CORONARY FRACTIONAL FLOW RESERVE DATA PREP  . CT CORONARY FRACTIONAL FLOW RESERVE FLUID ANALYSIS  . CBC w/Diff  . Basic Metabolic Panel (BMET)  . EKG 12-Lead    Disposition:   Follow up with Afib clinic and Dr Rayann Heman post procedure.   Army Fossa MD 03/15/2018 1:04 PM   [place] [address] [phone] (office) [phone] (fax)

## 2018-03-15 NOTE — Patient Instructions (Addendum)
Medication Instructions:  Your physician recommends that you continue on your current medications as directed. Please refer to the Current Medication list given to you today.  Labwork: You will get lab work today:  BMP and CBC  Testing/Procedures: Your physician has requested that you have cardiac CT. Cardiac computed tomography (CT) is a painless test that uses an x-ray machine to take clear, detailed pictures of your heart. For further information please visit [facility]. Please follow instruction sheet as given.  Schedule cardiac CT  Your physician has recommended that you have an ablation. Catheter ablation is a medical procedure used to treat some cardiac arrhythmias (irregular heartbeats). During catheter ablation, a long, thin, flexible tube is put into a blood vessel in your groin (upper thigh), or neck. This tube is called an ablation catheter. It is then guided to your heart through the blood vessel. Radio frequency waves destroy small areas of heart tissue where abnormal heartbeats may cause an arrhythmia to start. Please see the instruction sheet given to you today.   Follow-Up: You will follow up with Roderic Palau, NP with the Atrial fibrillation (Afib) clinic 4 weeks after your ablation.  You will follow up with Dr. Rayann Heman 3 months after your procedure.    CARDIAC CT INSTRUCTIONS:  Please arrive at the [facility] main entrance of [facility] 30-45 minutes prior to test start time  [facility] [place], [place] [address] [phone]  Proceed to the [facility] Radiology Department (First Floor).  Please follow these instructions carefully (unless otherwise directed):  On the Night Before the Test: . Be sure to Drink plenty of water. . Do not consume any caffeinated/decaffeinated beverages or chocolate 12 hours prior to your test. . Do not take any antihistamines 12 hours prior to your test. . If you take Metformin do  not take 24 hours prior to test.  On the Day of the Test: . Drink plenty of water. Do not drink any water within one hour of the test. . Do not eat any food 4 hours prior to the test. . You may take your regular medications prior to the test.  . Do NOT take your MAXZIDE morning of your CT  TAKE METOPROLOL 50 mg prior to your test if                -If HR is less than 55 BPM- No Beta Blocker                -If HR is greater than 55 BPM and patient is greater than 70 yrs old Lopressor 50 mg x1.     After the Test: . Drink plenty of water. . After receiving IV contrast, you may experience a mild flushed feeling. This is normal. . On occasion, you may experience a mild rash up to 24 hours after the test. This is not dangerous. If this occurs, you can take Benadryl 25 mg and increase your fluid intake. . If you experience trouble breathing, this can be serious. If it is severe call 911 IMMEDIATELY. If it is mild, please call our office. . Do NOT take your METFORMIN for 48 hours after your test .    ABLATION INSTRUCTIONS:  Please arrive to ADMITTING down the hall from the [facility] main entrance of [place] hospital at: 5:30 am on April 14, 2018 Do not eat or drink after midnight prior to procedure On the morning of your procedure do not take any  medications. Plan for one night stay.   You will need someone to drive you home at discharge.    Cardiac Ablation Cardiac ablation is a procedure to disable (ablate) a small amount of heart tissue in very specific places. The heart has many electrical connections. Sometimes these connections are abnormal and can cause the heart to beat very fast or irregularly. Ablating some of the problem areas can improve the heart rhythm or return it to normal. Ablation may be done for people who:  Have Wolff-Parkinson-White syndrome.  Have fast heart rhythms (tachycardia).  Have taken medicines for an abnormal heart rhythm (arrhythmia) that were  not effective or caused side effects.  Have a high-risk heartbeat that may be life-threatening. During the procedure, a small incision is made in the neck or the groin, and a long, thin, flexible tube (catheter) is inserted into the incision and moved to the heart. Small devices (electrodes) on the tip of the catheter will send out electrical currents. A type of X-ray (fluoroscopy) will be used to help guide the catheter and to provide images of the heart. Tell a health care provider about:  Any allergies you have.  All medicines you are taking, including vitamins, herbs, eye drops, creams, and over-the-counter medicines.  Any problems you or family members have had with anesthetic medicines.  Any blood disorders you have.  Any surgeries you have had.  Any medical conditions you have, such as kidney failure.  Whether you are pregnant or may be pregnant. What are the risks? Generally, this is a safe procedure. However, problems may occur, including:  Infection.  Bruising and bleeding at the catheter insertion site.  Bleeding into the chest, especially into the sac that surrounds the heart. This is a serious complication.  Stroke or blood clots.  Damage to other structures or organs.  Allergic reaction to medicines or dyes.  Need for a permanent pacemaker if the normal electrical system is damaged. A pacemaker is a small computer that sends electrical signals to the heart and helps your heart beat normally.  The procedure not being fully effective. This may not be recognized until months later. Repeat ablation procedures are sometimes required. What happens before the procedure?  Follow instructions from your health care provider about eating or drinking restrictions.  Ask your health care provider about: ? Changing or stopping your regular medicines. This is especially important if you are taking diabetes medicines or blood thinners. ? Taking medicines such as aspirin and  ibuprofen. These medicines can thin your blood. Do not take these medicines before your procedure if your health care provider instructs you not to.  Plan to have someone take you home from the hospital or clinic.  If you will be going home right after the procedure, plan to have someone with you for 24 hours. What happens during the procedure?  To lower your risk of infection: ? Your health care team will wash or sanitize their hands. ? Your skin will be washed with soap. ? Hair may be removed from the incision area.  An IV tube will be inserted into one of your veins.  You will be given a medicine to help you relax (sedative).  The skin on your neck or groin will be numbed.  An incision will be made in your neck or your groin.  A needle will be inserted through the incision and into a large vein in your neck or groin.  A catheter will be inserted into the needle and  moved to your heart.  Dye may be injected through the catheter to help your surgeon see the area of the heart that needs treatment.  Electrical currents will be sent from the catheter to ablate heart tissue in desired areas. There are three types of energy that may be used to ablate heart tissue: ? Heat (radiofrequency energy). ? Laser energy. ? Extreme cold (cryoablation).  When the necessary tissue has been ablated, the catheter will be removed.  Pressure will be held on the catheter insertion area to prevent excessive bleeding.  A bandage (dressing) will be placed over the catheter insertion area. The procedure may vary among health care providers and hospitals. What happens after the procedure?  Your blood pressure, heart rate, breathing rate, and blood oxygen level will be monitored until the medicines you were given have worn off.  Your catheter insertion area will be monitored for bleeding. You will need to lie still for a few hours to ensure that you do not bleed from the catheter insertion area.  Do  not drive for 24 hours or as long as directed by your health care provider. Summary  Cardiac ablation is a procedure to disable (ablate) a small amount of heart tissue in very specific places. Ablating some of the problem areas can improve the heart rhythm or return it to normal.  During the procedure, electrical currents will be sent from the catheter to ablate heart tissue in desired areas. This information is not intended to replace advice given to you by your health care provider. Make sure you discuss any questions you have with your health care provider. Document Released: 07/04/2008 Document Revised: 01/05/2016 Document Reviewed: 01/05/2016 Elsevier Interactive Patient Education  2019 [employer].

## 2018-03-16 DIAGNOSIS — E119 Type 2 diabetes mellitus without complications: Secondary | ICD-10-CM | POA: Diagnosis not present

## 2018-03-16 DIAGNOSIS — H5201 Hypermetropia, right eye: Secondary | ICD-10-CM | POA: Diagnosis not present

## 2018-03-16 DIAGNOSIS — Z961 Presence of intraocular lens: Secondary | ICD-10-CM | POA: Diagnosis not present

## 2018-03-16 LAB — CBC WITH DIFFERENTIAL/PLATELET
Basophils Absolute: 0.1 10*3/uL (ref 0.0–0.2)
Basos: 1 %
EOS (ABSOLUTE): 0.2 10*3/uL (ref 0.0–0.4)
Eos: 2 %
HEMOGLOBIN: 10.8 g/dL — AB (ref 11.1–15.9)
Hematocrit: 32.1 % — ABNORMAL LOW (ref 34.0–46.6)
IMMATURE GRANS (ABS): 0.1 10*3/uL (ref 0.0–0.1)
Immature Granulocytes: 1 %
Lymphocytes Absolute: 1.4 10*3/uL (ref 0.7–3.1)
Lymphs: 17 %
MCH: 29.6 pg (ref 26.6–33.0)
MCHC: 33.6 g/dL (ref 31.5–35.7)
MCV: 88 fL (ref 79–97)
Monocytes Absolute: 0.6 10*3/uL (ref 0.1–0.9)
Monocytes: 8 %
NEUTROS ABS: 5.6 10*3/uL (ref 1.4–7.0)
Neutrophils: 71 %
Platelets: 206 10*3/uL (ref 150–450)
RBC: 3.65 x10E6/uL — ABNORMAL LOW (ref 3.77–5.28)
RDW: 14 % (ref 11.7–15.4)
WBC: 7.9 10*3/uL (ref 3.4–10.8)

## 2018-03-16 LAB — BASIC METABOLIC PANEL
BUN/Creatinine Ratio: 16 (ref 12–28)
BUN: 9 mg/dL (ref 8–27)
CO2: 21 mmol/L (ref 20–29)
CREATININE: 0.58 mg/dL (ref 0.57–1.00)
Calcium: 9.3 mg/dL (ref 8.7–10.3)
Chloride: 105 mmol/L (ref 96–106)
GFR calc Af Amer: 101 mL/min/{1.73_m2} (ref >59)
GFR calc non Af Amer: 88 mL/min/{1.73_m2} (ref >59)
Glucose: 115 mg/dL — ABNORMAL HIGH (ref 65–99)
Potassium: 4.4 mmol/L (ref 3.5–5.2)
Sodium: 141 mmol/L (ref 134–144)

## 2018-03-19 ENCOUNTER — Encounter: Payer: Self-pay | Admitting: Obstetrics & Gynecology

## 2018-03-19 NOTE — Patient Instructions (Signed)
1. Well female exam with routine gynecological exam Gynecologic exam status post TAH and menopause.  Pap test on November 2018 was negative.  No indication to repeat this year.  Breast exam normal.  Will schedule a screening mammogram when due.  Health labs with family physician.  Colonoscopy in 2019.  2. S/P TAH (total abdominal hysterectomy)  3. Postmenopausal Well on no hormone replacement therapy.  4. Age-related osteoporosis without current pathological fracture Osteoporosis diagnosed on bone density in 2019.  On Prolia since June 2019.  Vitamin D supplements, calcium intake of 1.5 g/day and regular weightbearing physical activities recommended.  5. Malignant neoplasm of upper-inner quadrant of right breast in female, estrogen receptor positive ([place]) Diagnosis in 2017.  Status post right mastectomy.  Currently treated with Arimidex.  6. 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  Patty [name], it was a pleasure seeing you today!

## 2018-03-22 ENCOUNTER — Other Ambulatory Visit: Payer: Self-pay

## 2018-03-22 DIAGNOSIS — Z170 Estrogen receptor positive status [ER+]: Principal | ICD-10-CM

## 2018-03-22 DIAGNOSIS — C50211 Malignant neoplasm of upper-inner quadrant of right female breast: Secondary | ICD-10-CM

## 2018-03-23 ENCOUNTER — Telehealth: Payer: Self-pay | Admitting: Hematology and Oncology

## 2018-03-23 ENCOUNTER — Inpatient Hospital Stay: Payer: Medicare Other | Attending: Hematology and Oncology

## 2018-03-23 ENCOUNTER — Inpatient Hospital Stay (HOSPITAL_BASED_OUTPATIENT_CLINIC_OR_DEPARTMENT_OTHER): Payer: Medicare Other | Admitting: Hematology and Oncology

## 2018-03-23 ENCOUNTER — Encounter: Payer: Self-pay | Admitting: Hematology and Oncology

## 2018-03-23 DIAGNOSIS — R310 Gross hematuria: Secondary | ICD-10-CM | POA: Diagnosis not present

## 2018-03-23 DIAGNOSIS — C50211 Malignant neoplasm of upper-inner quadrant of right female breast: Secondary | ICD-10-CM | POA: Diagnosis not present

## 2018-03-23 DIAGNOSIS — D649 Anemia, unspecified: Secondary | ICD-10-CM | POA: Insufficient documentation

## 2018-03-23 DIAGNOSIS — C8591 Non-Hodgkin lymphoma, unspecified, lymph nodes of head, face, and neck: Secondary | ICD-10-CM

## 2018-03-23 DIAGNOSIS — N952 Postmenopausal atrophic vaginitis: Secondary | ICD-10-CM | POA: Diagnosis not present

## 2018-03-23 DIAGNOSIS — Z170 Estrogen receptor positive status [ER+]: Secondary | ICD-10-CM

## 2018-03-23 DIAGNOSIS — N312 Flaccid neuropathic bladder, not elsewhere classified: Secondary | ICD-10-CM | POA: Diagnosis not present

## 2018-03-23 DIAGNOSIS — N393 Stress incontinence (female) (male): Secondary | ICD-10-CM | POA: Diagnosis not present

## 2018-03-23 DIAGNOSIS — N3000 Acute cystitis without hematuria: Secondary | ICD-10-CM | POA: Diagnosis not present

## 2018-03-23 DIAGNOSIS — R3129 Other microscopic hematuria: Secondary | ICD-10-CM | POA: Diagnosis not present

## 2018-03-23 DIAGNOSIS — D638 Anemia in other chronic diseases classified elsewhere: Secondary | ICD-10-CM

## 2018-03-23 LAB — CBC WITH DIFFERENTIAL (CANCER CENTER ONLY)
Abs Immature Granulocytes: 0.08 10*3/uL — ABNORMAL HIGH (ref 0.00–0.07)
BASOS PCT: 1 %
Basophils Absolute: 0.1 10*3/uL (ref 0.0–0.1)
Eosinophils Absolute: 0.3 10*3/uL (ref 0.0–0.5)
Eosinophils Relative: 4 %
HCT: 34.9 % — ABNORMAL LOW (ref 36.0–46.0)
Hemoglobin: 10.9 g/dL — ABNORMAL LOW (ref 12.0–15.0)
Immature Granulocytes: 1 %
Lymphocytes Relative: 19 %
Lymphs Abs: 1.3 10*3/uL (ref 0.7–4.0)
MCH: 29.1 pg (ref 26.0–34.0)
MCHC: 31.2 g/dL (ref 30.0–36.0)
MCV: 93.3 fL (ref 80.0–100.0)
Monocytes Absolute: 0.6 10*3/uL (ref 0.1–1.0)
Monocytes Relative: 9 %
NRBC: 0 % (ref 0.0–0.2)
Neutro Abs: 4.4 10*3/uL (ref 1.7–7.7)
Neutrophils Relative %: 66 %
PLATELETS: 209 10*3/uL (ref 150–400)
RBC: 3.74 MIL/uL — ABNORMAL LOW (ref 3.87–5.11)
RDW: 14.6 % (ref 11.5–15.5)
WBC Count: 6.7 10*3/uL (ref 4.0–10.5)

## 2018-03-23 LAB — CMP (CANCER CENTER ONLY)
ALK PHOS: 77 U/L (ref 38–126)
ALT: 13 U/L (ref 0–44)
ANION GAP: 9 (ref 5–15)
AST: 16 U/L (ref 15–41)
Albumin: 4 g/dL (ref 3.5–5.0)
BUN: 9 mg/dL (ref 8–23)
CO2: 28 mmol/L (ref 22–32)
Calcium: 9.5 mg/dL (ref 8.9–10.3)
Chloride: 105 mmol/L (ref 98–111)
Creatinine: 0.7 mg/dL (ref 0.44–1.00)
GFR, Est AFR Am: >60 mL/min (ref >60)
GFR, Estimated: >60 mL/min (ref >60)
GLUCOSE: 121 mg/dL — AB (ref 70–99)
Potassium: 4.5 mmol/L (ref 3.5–5.1)
Sodium: 142 mmol/L (ref 135–145)
Total Bilirubin: 0.5 mg/dL (ref 0.3–1.2)
Total Protein: 6.5 g/dL (ref 6.5–8.1)

## 2018-03-23 NOTE — Assessment & Plan Note (Signed)
She is taking Arimidex since 07/14/2015 as part of the adjuvant treatment for breast cancer. So far, she tolerated treatment well without any side effects. I will continue to see her on the regular basis including breast examination and will make sure that she get regular screening mammogram, next due next month Examination is benign today She will continue Arimidex for  5 years. She was started on Prolia by her primary care doctor along with calcium and vitamin D for osteoporosis prevention.

## 2018-03-23 NOTE — Progress Notes (Signed)
[place] OFFICE PROGRESS NOTE  Patient Care Team: Deland Pretty, MD as PCP - General (Internal Medicine) Adrian Prows, MD as Consulting Physician (Cardiology) Heath Lark, MD as Consulting Physician (Hematology and Oncology) Fanny Skates, MD as Consulting Physician (General Surgery) Eppie Gibson, MD as Attending Physician (Radiation Oncology) Sylvan Cheese, NP as Nurse Practitioner (Hematology and Oncology) Thompson Grayer, MD as Consulting Physician (Cardiology) Lahoma Rocker, MD as Consulting Physician (Rheumatology) Terrance Mass, MD (Inactive) as Consulting Physician (Gynecology)  ASSESSMENT & PLAN:  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 So far, she tolerated treatment well without any side effects. I will continue to see her on the regular basis including breast examination and will make sure that she get regular screening mammogram, next due next month Examination is benign today She will continue Arimidex for  5 years. She was started on Prolia by her primary care doctor along with calcium and vitamin D for osteoporosis prevention.  Anemia in chronic illness This is likely anemia of chronic disease. The patient denies recent history of bleeding such as epistaxis, hematuria or hematochezia. She is asymptomatic from the anemia. We will observe for now.    No orders of the defined types were placed in this encounter.   INTERVAL HISTORY: Please see below for problem oriented charting. She returns with her husband for further follow-up She was started on Prolia by her  primary care doctor for osteoporosis prevention Denies new lymphadenopathy The patient denies any recent signs or symptoms of bleeding such as spontaneous epistaxis, hematuria or hematochezia. She is nervous about getting ablation for atrial fibrillation. 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    01/03/2014 Bone Marrow Biopsy    Accession: [id number] Bone marrow biopsy is negative    01/04/2014 Imaging    ECHO showed normal EF    01/04/2014 Procedure    She has placement of port    01/09/2014 - 03/07/2014 Chemotherapy    She was given treatment with bendamustine with rituximab. Treatment was stopped due to severe side-effects despite significant dose adjustment for cycle 2    01/18/2014 - 02/01/2014 Hospital Admission    She was admitted to the hospital from Escherichia coli sepsis with multiorgan failure and brief  episodes of intubation. She was discharged to skilled nursing facility    02/26/2014 Imaging    PET/CT scan showed near complete remission    02/27/2014 Adverse Reaction    Cycle 2 of treatment was resumed with drastic dose adjustment to bendamustine due to recent multi-organ  failure    04/03/2014 - 03/13/2015 Chemotherapy    She is started on maintenance rituximab only.    04/09/2014 - 04/12/2014 Hospital Admission    The patient was admitted to the hospital with urinary tract infection and sepsis.    06/05/2014 Imaging    PET CT scan showed complete response to Rx    02/12/2015 Imaging    Ct scan showed no evidence of disease. It shows she has new pneumonia    04/02/2015 Miscellaneous    She received 1 dose IVIG complicated by mild infusion reaction    04/26/2015 - 04/30/2015 Hospital Admission    She had recurrent admission to the hospital with sepsis    10/22/2015 Imaging    CT scan of chest and abdomen showed Ill-defined tissue in the porta hepatis suggest treated lymphoma. No evidence of lymphadenopathy in the chest, abdomen & pelvis to suggest recurrent lymphoma.    10/20/2016 Imaging    1. Stable appearance of ill defined soft tissue within the porta hepatis compatible with treated lymphoma. 2. No new findings identified. 3. Aortic Atherosclerosis (ICD10-I70.0). LAD coronary artery calcifications noted.    04/01/2017 Procedure    Status post right IJ port catheter removal     Breast cancer of upper-inner quadrant of right female breast ([place])   11/06/2014 Imaging    DEXA scan showed osteopenia T-1.9 on bilateral femoral neck    03/31/2015 Imaging    Screening mammogram showed suspicious lesion, confirmed on diagnostic imaging at 2 and 230 position on the right breast    04/10/2015 Pathology Results    Accession: [id number] breast biopsy in 2 locations came back invasive ductal carcinoma with calcification, 100% ER and PR positive, HER 2 neg    04/10/2015 Clinical Stage    Stage IA: T1 N0    05/27/2015 Surgery    Right total mastectomy: multifocal IDC, 1.5 and 1.0 cm, neg margins, ER+ (100% - both); PR+ (100% and 95%), HER2neu negative (ratio 1.69 and 1.14) Ki67 2% and 10%. DCIS    05/27/2015 Pathologic Stage    Stage IA: mpT1c pNx pMx    06/17/2015  Survivorship    Survivorship care plan mailed to patient at her request    07/14/2015 -  Anti-estrogen oral therapy    She started taking Arimidex    04/01/2016 Imaging    No mammographic evidence of malignancy in the left breast.    04/08/2017 Imaging    Screening mammogram in one year. Neg     REVIEW OF SYSTEMS:   Constitutional: Denies fevers, chills or abnormal weight loss Eyes: Denies blurriness of vision Ears, nose, mouth, throat, and face: Denies mucositis or sore throat Respiratory: Denies cough, dyspnea or wheezes Cardiovascular: Denies palpitation, chest discomfort or lower extremity swelling Gastrointestinal:  Denies nausea, heartburn or change in bowel habits Skin: Denies abnormal skin rashes Lymphatics: Denies new lymphadenopathy or easy bruising Neurological:Denies numbness, tingling or new weaknesses Behavioral/Psych: Mood is stable, no new changes  All other systems were reviewed with the patient and are negative.  I have reviewed the past medical history, past surgical history, social history and family history with the patient and they are unchanged from previous note.  ALLERGIES:  is  allergic to morphine and related; multaq [dronedarone]; demerol; oysters [shellfish allergy]; penicillins; and sulfa drugs cross reactors.  MEDICATIONS:  Current Outpatient Medications  Medication Sig Dispense Refill  . albuterol (PROVENTIL HFA;VENTOLIN HFA) 108 (90 Base) MCG/ACT inhaler Inhale 1-2 puffs into the lungs every 6 (six) hours as needed for wheezing or shortness of breath.     . anastrozole (ARIMIDEX) 1 MG tablet TAKE ONE TABLET BY MOUTH EVERY DAY (Patient taking differently: Take 1 mg by mouth daily. ) 90 tablet 9  . Cholecalciferol (VITAMIN D) 2000 UNITS tablet Take 2,000 Units by mouth daily with lunch.    . Cyanocobalamin (B-12) 2500 MCG TABS Take 2,500 mcg by mouth daily with lunch.     . Fluticasone-Salmeterol (ADVAIR) 250-50 MCG/DOSE AEPB Inhale 1 puff into the lungs  every 12 (twelve) hours.     . gabapentin (NEURONTIN) 300 MG capsule Take 300 mg by mouth at bedtime.     Marland Kitchen levothyroxine (SYNTHROID, LEVOTHROID) 200 MCG tablet Take 200 mcg by mouth daily before breakfast.    . Magnesium Oxide 500 MG TABS Take 500 mg by mouth 2 (two) times daily.     . metFORMIN (GLUCOPHAGE-XR) 500 MG 24 hr tablet Take 500 mg by mouth 2 (two) times daily with a meal.    . metoprolol tartrate (LOPRESSOR) 50 MG tablet Take 1 tablet (50 mg total) by mouth once for 1 dose. 1 tablet 0  . montelukast (SINGULAIR) 10 MG tablet Take 10 mg by mouth daily.    Marland Kitchen omega-3 acid ethyl esters (LOVAZA) 1 g capsule Take 1 g by mouth 2 (two) times daily.    . pravastatin (PRAVACHOL) 40 MG tablet Take 20 mg by mouth See admin instructions. Take 0.5 tablet (20 mg) by mouth on Mondays through Thursdays.  0  . PROLIA 60 MG/ML SOSY injection Inject 60 mg into the vein every 6 (six) months.    . rivaroxaban (XARELTO) 20 MG TABS tablet Take 20 mg by mouth daily with supper.    . traMADol-acetaminophen (ULTRACET) 37.5-325 MG tablet Take 1 tablet by mouth every 6 (six) hours as needed (pain).    . triamterene-hydrochlorothiazide (MAXZIDE-25) 37.5-25 MG tablet Take 1 tablet by mouth daily as needed (for fluid retention/swelling.).      No current facility-administered medications for this visit.     PHYSICAL EXAMINATION: ECOG PERFORMANCE STATUS: 1 - Symptomatic but completely ambulatory  Vitals:   03/23/18 1020  BP: (!) 150/92  Pulse: (!) 58  Resp: 18  Temp: 98.2 F (36.8 C)  SpO2: 99%   Filed Weights   03/23/18 1020  Weight: 145 lb 12.8 oz (66.1 kg)    GENERAL:alert, no distress and comfortable SKIN: skin color, texture, turgor are normal, no rashes or significant lesions EYES: normal, Conjunctiva are pink and non-injected, sclera clear OROPHARYNX:no exudate, no erythema and lips, buccal mucosa, and tongue normal  NECK: supple, thyroid normal size, non-tender, without nodularity LYMPH:   no palpable lymphadenopathy in the cervical, axillary or inguinal LUNGS: clear to auscultation and percussion with normal breathing effort HEART: irregular rate & rhythm and no murmurs and no lower extremity edema ABDOMEN:abdomen soft, non-tender and normal bowel sounds Musculoskeletal:no cyanosis of digits and no clubbing  NEURO: alert & oriented x 3 with fluent speech, no focal motor/sensory deficits Chest wall examination revealed well-healed right mastectomy scar with no abnormalities.  Normal breast exam on the left  LABORATORY DATA:  I have reviewed the data as listed    Component Value Date/Time  NA 142 03/23/2018 1000   NA 141 03/15/2018 1258   NA 140 01/27/2017 0811   K 4.5 03/23/2018 1000   K 3.9 01/27/2017 0811   CL 105 03/23/2018 1000   CO2 28 03/23/2018 1000   CO2 25 01/27/2017 0811   GLUCOSE 121 (H) 03/23/2018 1000   GLUCOSE 122 01/27/2017 0811   BUN 9 03/23/2018 1000   BUN 9 03/15/2018 1258   BUN 16.9 01/27/2017 0811   CREATININE 0.70 03/23/2018 1000   CREATININE 0.8 01/27/2017 0811   CALCIUM 9.5 03/23/2018 1000   CALCIUM 9.9 01/27/2017 0811   PROT 6.5 03/23/2018 1000   PROT 6.9 01/27/2017 0811   ALBUMIN 4.0 03/23/2018 1000   ALBUMIN 4.4 01/27/2017 0811   AST 16 03/23/2018 1000   AST 15 01/27/2017 0811   ALT 13 03/23/2018 1000   ALT 15 01/27/2017 0811   ALKPHOS 77 03/23/2018 1000   ALKPHOS 77 01/27/2017 0811   BILITOT 0.5 03/23/2018 1000   BILITOT 0.65 01/27/2017 0811   GFRNONAA >60 03/23/2018 1000   GFRAA >60 03/23/2018 1000    No results found for: SPEP, UPEP  Lab Results  Component Value Date   WBC 6.7 03/23/2018   NEUTROABS 4.4 03/23/2018   HGB 10.9 (L) 03/23/2018   HCT 34.9 (L) 03/23/2018   MCV 93.3 03/23/2018   PLT 209 03/23/2018      Chemistry      Component Value Date/Time   NA 142 03/23/2018 1000   NA 141 03/15/2018 1258   NA 140 01/27/2017 0811   K 4.5 03/23/2018 1000   K 3.9 01/27/2017 0811   CL 105 03/23/2018 1000   CO2 28  03/23/2018 1000   CO2 25 01/27/2017 0811   BUN 9 03/23/2018 1000   BUN 9 03/15/2018 1258   BUN 16.9 01/27/2017 0811   CREATININE 0.70 03/23/2018 1000   CREATININE 0.8 01/27/2017 0811      Component Value Date/Time   CALCIUM 9.5 03/23/2018 1000   CALCIUM 9.9 01/27/2017 0811   ALKPHOS 77 03/23/2018 1000   ALKPHOS 77 01/27/2017 0811   AST 16 03/23/2018 1000   AST 15 01/27/2017 0811   ALT 13 03/23/2018 1000   ALT 15 01/27/2017 0811   BILITOT 0.5 03/23/2018 1000   BILITOT 0.65 01/27/2017 0811      All questions were answered. The patient knows to call the clinic with any problems, questions or concerns. No barriers to learning was detected.  I spent 15 minutes counseling the patient face to face. The total time spent in the appointment was 20 minutes and more than 50% was on counseling and review of test results  Heath Lark, MD 03/23/2018 11:34 AM

## 2018-03-23 NOTE — Assessment & Plan Note (Signed)
Her last CT scan which showed no evidence of active disease She has completed chemotherapy treatment with no signs of recurrent for more than 2 years I will continue to examine her in 6 months 

## 2018-03-23 NOTE — Telephone Encounter (Signed)
Gave avs and calendar ° °

## 2018-03-23 NOTE — Assessment & Plan Note (Signed)
This is likely anemia of chronic disease. The patient denies recent history of bleeding such as epistaxis, hematuria or hematochezia. She is asymptomatic from the anemia. We will observe for now.  

## 2018-03-28 ENCOUNTER — Ambulatory Visit (INDEPENDENT_AMBULATORY_CARE_PROVIDER_SITE_OTHER): Payer: Medicare Other | Admitting: Podiatry

## 2018-03-28 DIAGNOSIS — M79675 Pain in left toe(s): Secondary | ICD-10-CM | POA: Diagnosis not present

## 2018-03-28 DIAGNOSIS — E1142 Type 2 diabetes mellitus with diabetic polyneuropathy: Secondary | ICD-10-CM

## 2018-03-28 DIAGNOSIS — B351 Tinea unguium: Secondary | ICD-10-CM | POA: Diagnosis not present

## 2018-03-28 DIAGNOSIS — M79674 Pain in right toe(s): Secondary | ICD-10-CM

## 2018-03-28 NOTE — Patient Instructions (Signed)

## 2018-03-30 DIAGNOSIS — N3000 Acute cystitis without hematuria: Secondary | ICD-10-CM | POA: Diagnosis not present

## 2018-03-30 DIAGNOSIS — N312 Flaccid neuropathic bladder, not elsewhere classified: Secondary | ICD-10-CM | POA: Diagnosis not present

## 2018-03-30 DIAGNOSIS — N393 Stress incontinence (female) (male): Secondary | ICD-10-CM | POA: Diagnosis not present

## 2018-04-05 ENCOUNTER — Telehealth (HOSPITAL_COMMUNITY): Payer: Self-pay | Admitting: Emergency Medicine

## 2018-04-05 NOTE — Telephone Encounter (Signed)
Reaching out to patient to offer assistance regarding upcoming cardiac imaging study; pt verbalizes understanding of appt date/time, parking situation and where to check in, pre-test NPO status and medications ordered, and verified current allergies; name and call back number provided for further questions should they arise Corvette Orser RN Navigator Cardiac Imaging [facility] [phone] office [phone] cell 

## 2018-04-07 ENCOUNTER — Ambulatory Visit (HOSPITAL_COMMUNITY)
Admission: RE | Admit: 2018-04-07 | Discharge: 2018-04-07 | Disposition: A | Discharge status: Home / Self Care | Payer: Medicare Other | Source: Ambulatory Visit | Attending: Internal Medicine | Admitting: Internal Medicine

## 2018-04-07 ENCOUNTER — Encounter: Payer: Self-pay | Admitting: Podiatry

## 2018-04-07 ENCOUNTER — Ambulatory Visit (HOSPITAL_COMMUNITY): Admission: RE | Admit: 2018-04-07 | Payer: Medicare Other | Source: Ambulatory Visit

## 2018-04-07 ENCOUNTER — Encounter (HOSPITAL_COMMUNITY): Payer: Self-pay

## 2018-04-07 DIAGNOSIS — I4891 Unspecified atrial fibrillation: Secondary | ICD-10-CM | POA: Diagnosis not present

## 2018-04-07 MED ORDER — METOPROLOL TARTRATE 5 MG/5ML IV SOLN
5.0000 mg | INTRAVENOUS | Status: DC | PRN
  Administered 2018-04-07: 5 mg via INTRAVENOUS
  Filled 2018-04-07: qty 5

## 2018-04-07 MED ORDER — METOPROLOL TARTRATE 5 MG/5ML IV SOLN
INTRAVENOUS | Status: AC
  Filled 2018-04-07: qty 5

## 2018-04-07 MED ORDER — IOPAMIDOL (ISOVUE-370) INJECTION 76%
80.0000 mL | Freq: Once | INTRAVENOUS | Status: AC | PRN
  Administered 2018-04-07: 80 mL via INTRAVENOUS

## 2018-04-07 NOTE — Progress Notes (Signed)
Subjective: Patty [name] presents today with history of diabetic neuropathy with cc of painful, mycotic toenails.  Pain is aggravated when wearing enclosed shoe gear and relieved with periodic professional debridement.  Patient's neuropathy is managed with gabapentin.  Deland Pretty, MD is her PCP.  Current Outpatient Medications:  .  albuterol (PROVENTIL HFA;VENTOLIN HFA) 108 (90 Base) MCG/ACT inhaler, Inhale 1-2 puffs into the lungs every 6 (six) hours as needed for wheezing or shortness of breath. , Disp: , Rfl:  .  anastrozole (ARIMIDEX) 1 MG tablet, TAKE ONE TABLET BY MOUTH EVERY DAY (Patient taking differently: Take 1 mg by mouth daily. ), Disp: 90 tablet, Rfl: 9 .  benzonatate (TESSALON) 200 MG capsule, Take 200 mg by mouth 3 (three) times daily as needed for cough. , Disp: , Rfl:  .  Cholecalciferol (VITAMIN D) 2000 UNITS tablet, Take 2,000 Units by mouth daily with lunch., Disp: , Rfl:  .  Cyanocobalamin (B-12) 2500 MCG TABS, Take 2,500 mcg by mouth daily with lunch. , Disp: , Rfl:  .  Fluticasone-Salmeterol (ADVAIR) 250-50 MCG/DOSE AEPB, Inhale 1 puff into the lungs every 12 (twelve) hours. , Disp: , Rfl:  .  gabapentin (NEURONTIN) 300 MG capsule, Take 600 mg by mouth at bedtime. , Disp: , Rfl:  .  levothyroxine (SYNTHROID, LEVOTHROID) 200 MCG tablet, Take 200 mcg by mouth daily before breakfast., Disp: , Rfl:  .  Magnesium Oxide 500 MG TABS, Take 500 mg by mouth 2 (two) times daily. , Disp: , Rfl:  .  metFORMIN (GLUCOPHAGE-XR) 500 MG 24 hr tablet, Take 500 mg by mouth 2 (two) times daily with a meal., Disp: , Rfl:  .  montelukast (SINGULAIR) 10 MG tablet, Take 10 mg by mouth daily., Disp: , Rfl:  .  omega-3 acid ethyl esters (LOVAZA) 1 g capsule, Take 1 g by mouth 2 (two) times daily., Disp: , Rfl:  .  pravastatin (PRAVACHOL) 40 MG tablet, Take 20 mg by mouth See admin instructions. Take 0.5 tablet (20 mg) by mouth on Mondays through Thursdays., Disp: , Rfl: 0 .  PROLIA 60 MG/ML  SOSY injection, Inject 60 mg into the vein every 6 (six) months., Disp: , Rfl:  .  rivaroxaban (XARELTO) 20 MG TABS tablet, Take 20 mg by mouth daily with supper., Disp: , Rfl:  .  traMADol-acetaminophen (ULTRACET) 37.5-325 MG tablet, Take 1 tablet by mouth every 6 (six) hours as needed (pain)., Disp: , Rfl:  .  triamterene-hydrochlorothiazide (MAXZIDE-25) 37.5-25 MG tablet, Take 1 tablet by mouth daily as needed (for fluid retention/swelling.). , Disp: , Rfl:  No current facility-administered medications for this visit.   Facility-Administered Medications Ordered in Other Visits:  .  metoprolol tartrate (LOPRESSOR) 5 MG/5ML injection, , , ,  .  metoprolol tartrate (LOPRESSOR) injection 5 mg, 5 mg, Intravenous, Q5 min PRN, Dorothy Spark, MD, 5 mg at 04/07/18 1035  Allergies  Allergen Reactions  . Morphine And Related Anaphylaxis and Other (See Comments)    Pt states she stopped breathing post op- "went into resp. arrest"  . Multaq [Dronedarone] Other (See Comments)    Reaction:  Blood in urine and elevated liver enzymes.   . Demerol Nausea And Vomiting  . Oysters [Shellfish Allergy] Rash    & CLAMS  . Penicillins Itching and Rash    Has patient had a PCN reaction causing immediate rash, facial/tongue/throat swelling, SOB or lightheadedness with hypotension: No Has patient had a PCN reaction causing severe rash involving mucus membranes or  skin necrosis: Yes Has patient had a PCN reaction that required hospitalization No Has patient had a PCN reaction occurring within the last 10 years: No If all of the above answers are "NO", then may proceed with Cephalosporin use.   . Sulfa Drugs Cross Reactors Rash    Objective:  Vascular Examination: Capillary refill time immediate x 10 digits  Dorsalis pedis and Posterior tibial pulses palpable bilaterally Digital hair x 10 digits was sparse skin temperature gradient WNL b/l  Dermatological Examination: Skin with normal turgor, texture  and tone b/l  Toenails 1-5 b/l discolored, thick, dystrophic with subungual debris and pain with palpation to nailbeds due to thickness of nails.  Musculoskeletal: Muscle strength 5/5 to all muscle groups b/l  Neurological: Sensation with 10 gram monofilament is decreased b/l Vibratory sensation absent b/l.  Assessment: 1. Painful onychomycosis toenails 1-5 b/l 2. NIDDM with neuropathy   Plan: 1. Toenails 1-5 b/l were debrided in length and girth without iatrogenic bleeding. 2. Patient to continue soft, supportive shoe gear 3. Patient to report any pedal injuries to medical professional  4. Follow up 3 months.  5. Patient/POA to call should there be a concern in the interim.

## 2018-04-07 NOTE — Progress Notes (Signed)
Patient was unsure if she should take Metoprolol 50 mg PO. Heart rate currently 102. Patient took medication while waiting for CT scan.

## 2018-04-07 NOTE — Progress Notes (Signed)
Patient tolerated CT without incident. Did not want anything to drink or eat. Ambulatory steady gait with cane to exit.

## 2018-04-12 ENCOUNTER — Ambulatory Visit
Admission: RE | Admit: 2018-04-12 | Discharge: 2018-04-12 | Disposition: A | Discharge status: Home / Self Care | Payer: Medicare Other | Source: Ambulatory Visit | Attending: Internal Medicine | Admitting: Internal Medicine

## 2018-04-12 DIAGNOSIS — Z1231 Encounter for screening mammogram for malignant neoplasm of breast: Secondary | ICD-10-CM | POA: Diagnosis not present

## 2018-04-13 ENCOUNTER — Encounter (HOSPITAL_COMMUNITY): Payer: Self-pay | Admitting: Anesthesiology

## 2018-04-13 NOTE — Anesthesia Preprocedure Evaluation (Addendum)
Anesthesia Evaluation  Patient identified by MRN, date of birth, ID band Patient awake    Reviewed: Allergy & Precautions, NPO status , Patient's Chart, lab work & pertinent test results  History of Anesthesia Complications (+) history of anesthetic complications  Airway Mallampati: II  TM Distance: >3 FB Neck ROM: Full    Dental  (+) Partial Lower, Dental Advisory Given   Pulmonary shortness of breath and with exertion, asthma , sleep apnea and Continuous Positive Airway Pressure Ventilation , pneumonia, resolved, COPD,  COPD inhaler,    Pulmonary exam normal breath sounds clear to auscultation       Cardiovascular hypertension, Pt. on medications Normal cardiovascular exam+ dysrhythmias Atrial Fibrillation  Rhythm:Regular Rate:Normal  RBBB LVEF 45% Mild to mod AI Mild MR and TR   Neuro/Psych Diabetic neuropathy Lumbar radiculopathy  Neuromuscular disease negative psych ROS   GI/Hepatic negative GI ROS, Neg liver ROS,   Endo/Other  diabetes, Well Controlled, Type 2, Oral Hypoglycemic AgentsHypothyroidism Hx/o right breast Ca S/P mastectomy Hypercholesterolemia  Renal/GU Renal disease  negative genitourinary   Musculoskeletal  (+) Arthritis , Osteoarthritis,    Abdominal   Peds  Hematology  (+) anemia , Hx/o NHL in remission Xarelto therapy-last dose yesterday pm   Anesthesia Other Findings   Reproductive/Obstetrics                          Anesthesia Physical Anesthesia Plan  ASA: III  Anesthesia Plan: General   Post-op Pain Management:    Induction: Intravenous  PONV Risk Score and Plan: 3 and Ondansetron and Treatment may vary due to age or medical condition  Airway Management Planned: Oral ETT  Additional Equipment:   Intra-op Plan:   Post-operative Plan: Extubation in OR  Informed Consent: I have reviewed the patients History and Physical, chart, labs and discussed  the procedure including the risks, benefits and alternatives for the proposed anesthesia with the patient or authorized representative who has indicated his/her understanding and acceptance.     Dental advisory given  Plan Discussed with: CRNA and Surgeon  Anesthesia Plan Comments:        Anesthesia Quick Evaluation

## 2018-04-14 ENCOUNTER — Ambulatory Visit (HOSPITAL_COMMUNITY)
Admission: RE | Admit: 2018-04-14 | Discharge: 2018-04-14 | Disposition: A | Discharge status: Home / Self Care | Payer: Medicare Other | Attending: Internal Medicine | Admitting: Internal Medicine

## 2018-04-14 ENCOUNTER — Other Ambulatory Visit: Payer: Self-pay

## 2018-04-14 ENCOUNTER — Ambulatory Visit (HOSPITAL_COMMUNITY)
Admission: RE | Disposition: A | Discharge status: Home / Self Care | Payer: Medicare Other | Source: Home / Self Care | Attending: Internal Medicine

## 2018-04-14 ENCOUNTER — Ambulatory Visit (HOSPITAL_COMMUNITY): Payer: Medicare Other | Admitting: Certified Registered Nurse Anesthetist

## 2018-04-14 DIAGNOSIS — Z7901 Long term (current) use of anticoagulants: Secondary | ICD-10-CM | POA: Diagnosis not present

## 2018-04-14 DIAGNOSIS — I4819 Other persistent atrial fibrillation: Secondary | ICD-10-CM | POA: Diagnosis not present

## 2018-04-14 DIAGNOSIS — Z882 Allergy status to sulfonamides status: Secondary | ICD-10-CM | POA: Diagnosis not present

## 2018-04-14 DIAGNOSIS — I129 Hypertensive chronic kidney disease with stage 1 through stage 4 chronic kidney disease, or unspecified chronic kidney disease: Secondary | ICD-10-CM | POA: Diagnosis not present

## 2018-04-14 DIAGNOSIS — R0602 Shortness of breath: Secondary | ICD-10-CM | POA: Diagnosis not present

## 2018-04-14 DIAGNOSIS — J439 Emphysema, unspecified: Secondary | ICD-10-CM | POA: Insufficient documentation

## 2018-04-14 DIAGNOSIS — Z79899 Other long term (current) drug therapy: Secondary | ICD-10-CM | POA: Insufficient documentation

## 2018-04-14 DIAGNOSIS — M1990 Unspecified osteoarthritis, unspecified site: Secondary | ICD-10-CM | POA: Insufficient documentation

## 2018-04-14 DIAGNOSIS — Z8249 Family history of ischemic heart disease and other diseases of the circulatory system: Secondary | ICD-10-CM | POA: Diagnosis not present

## 2018-04-14 DIAGNOSIS — I4891 Unspecified atrial fibrillation: Secondary | ICD-10-CM | POA: Diagnosis not present

## 2018-04-14 DIAGNOSIS — Z833 Family history of diabetes mellitus: Secondary | ICD-10-CM | POA: Insufficient documentation

## 2018-04-14 DIAGNOSIS — G4730 Sleep apnea, unspecified: Secondary | ICD-10-CM | POA: Insufficient documentation

## 2018-04-14 DIAGNOSIS — E1122 Type 2 diabetes mellitus with diabetic chronic kidney disease: Secondary | ICD-10-CM | POA: Insufficient documentation

## 2018-04-14 DIAGNOSIS — N189 Chronic kidney disease, unspecified: Secondary | ICD-10-CM | POA: Insufficient documentation

## 2018-04-14 DIAGNOSIS — Z90711 Acquired absence of uterus with remaining cervical stump: Secondary | ICD-10-CM | POA: Insufficient documentation

## 2018-04-14 DIAGNOSIS — E039 Hypothyroidism, unspecified: Secondary | ICD-10-CM | POA: Insufficient documentation

## 2018-04-14 DIAGNOSIS — Z823 Family history of stroke: Secondary | ICD-10-CM | POA: Insufficient documentation

## 2018-04-14 DIAGNOSIS — Z885 Allergy status to narcotic agent status: Secondary | ICD-10-CM | POA: Insufficient documentation

## 2018-04-14 DIAGNOSIS — Z880 Allergy status to penicillin: Secondary | ICD-10-CM | POA: Insufficient documentation

## 2018-04-14 DIAGNOSIS — I2510 Atherosclerotic heart disease of native coronary artery without angina pectoris: Secondary | ICD-10-CM | POA: Insufficient documentation

## 2018-04-14 DIAGNOSIS — I10 Essential (primary) hypertension: Secondary | ICD-10-CM | POA: Diagnosis not present

## 2018-04-14 DIAGNOSIS — I484 Atypical atrial flutter: Secondary | ICD-10-CM | POA: Diagnosis not present

## 2018-04-14 DIAGNOSIS — Z9011 Acquired absence of right breast and nipple: Secondary | ICD-10-CM | POA: Diagnosis not present

## 2018-04-14 DIAGNOSIS — E7800 Pure hypercholesterolemia, unspecified: Secondary | ICD-10-CM | POA: Diagnosis not present

## 2018-04-14 DIAGNOSIS — G709 Myoneural disorder, unspecified: Secondary | ICD-10-CM | POA: Diagnosis not present

## 2018-04-14 DIAGNOSIS — Z7984 Long term (current) use of oral hypoglycemic drugs: Secondary | ICD-10-CM | POA: Diagnosis not present

## 2018-04-14 DIAGNOSIS — Z79890 Hormone replacement therapy (postmenopausal): Secondary | ICD-10-CM | POA: Insufficient documentation

## 2018-04-14 HISTORY — PX: ATRIAL FIBRILLATION ABLATION: EP1191

## 2018-04-14 LAB — POCT ACTIVATED CLOTTING TIME
Activated Clotting Time: 301 seconds
Activated Clotting Time: 307 seconds
Activated Clotting Time: 180 seconds
Activated Clotting Time: 191 seconds

## 2018-04-14 LAB — GLUCOSE, CAPILLARY
Glucose-Capillary: 136 mg/dL — ABNORMAL HIGH (ref 70–99)
Glucose-Capillary: 142 mg/dL — ABNORMAL HIGH (ref 70–99)

## 2018-04-14 SURGERY — ATRIAL FIBRILLATION ABLATION
Anesthesia: General

## 2018-04-14 MED ORDER — ACETAMINOPHEN 500 MG PO TABS
ORAL_TABLET | ORAL | Status: AC
  Administered 2018-04-14: 1000 mg via ORAL
  Filled 2018-04-14: qty 2

## 2018-04-14 MED ORDER — ROCURONIUM BROMIDE 50 MG/5ML IV SOSY
PREFILLED_SYRINGE | INTRAVENOUS | Status: DC | PRN
  Administered 2018-04-14 (×2): 10 mg via INTRAVENOUS
  Administered 2018-04-14: 50 mg via INTRAVENOUS
  Administered 2018-04-14: 10 mg via INTRAVENOUS

## 2018-04-14 MED ORDER — LIDOCAINE 2% (20 MG/ML) 5 ML SYRINGE
INTRAMUSCULAR | Status: DC | PRN
  Administered 2018-04-14: 60 mg via INTRAVENOUS

## 2018-04-14 MED ORDER — SODIUM CHLORIDE 0.9% FLUSH: 3.0000 mL | INTRAVENOUS | Status: DC | PRN

## 2018-04-14 MED ORDER — ACETAMINOPHEN 325 MG PO TABS
650.0000 mg | ORAL_TABLET | ORAL | Status: DC | PRN
  Filled 2018-04-14: qty 2

## 2018-04-14 MED ORDER — SUGAMMADEX SODIUM 200 MG/2ML IV SOLN
INTRAVENOUS | Status: DC | PRN
  Administered 2018-04-14: 200 mg via INTRAVENOUS

## 2018-04-14 MED ORDER — SODIUM CHLORIDE 0.9% FLUSH: 3.0000 mL | Freq: Two times a day (BID) | INTRAVENOUS | Status: DC

## 2018-04-14 MED ORDER — BUPIVACAINE HCL (PF) 0.25 % IJ SOLN
INTRAMUSCULAR | Status: AC
  Filled 2018-04-14: qty 30

## 2018-04-14 MED ORDER — ONDANSETRON HCL 4 MG/2ML IJ SOLN
INTRAMUSCULAR | Status: DC | PRN
  Administered 2018-04-14: 4 mg via INTRAVENOUS

## 2018-04-14 MED ORDER — ACETAMINOPHEN 500 MG PO TABS
1000.0000 mg | ORAL_TABLET | Freq: Once | ORAL | Status: AC
  Administered 2018-04-14: 1000 mg via ORAL
  Filled 2018-04-14: qty 2

## 2018-04-14 MED ORDER — SODIUM CHLORIDE 0.9 % IV SOLN
INTRAVENOUS | Status: DC
  Administered 2018-04-14: 07:00:00 via INTRAVENOUS

## 2018-04-14 MED ORDER — LACTATED RINGERS IV SOLN
INTRAVENOUS | Status: DC | PRN
  Administered 2018-04-14: 07:00:00 via INTRAVENOUS

## 2018-04-14 MED ORDER — ONDANSETRON HCL 4 MG/2ML IJ SOLN: 4.0000 mg | Freq: Four times a day (QID) | INTRAMUSCULAR | Status: DC | PRN

## 2018-04-14 MED ORDER — HEPARIN SODIUM (PORCINE) 1000 UNIT/ML IJ SOLN
INTRAMUSCULAR | Status: DC | PRN
  Administered 2018-04-14: 12000 [IU] via INTRAVENOUS
  Administered 2018-04-14: 1000 [IU] via INTRAVENOUS

## 2018-04-14 MED ORDER — PROTAMINE SULFATE 10 MG/ML IV SOLN
INTRAVENOUS | Status: DC | PRN
  Administered 2018-04-14: 5 mg via INTRAVENOUS
  Administered 2018-04-14: 25 mg via INTRAVENOUS

## 2018-04-14 MED ORDER — HEPARIN (PORCINE) IN NACL 1000-0.9 UT/500ML-% IV SOLN
INTRAVENOUS | Status: AC
  Filled 2018-04-14: qty 500

## 2018-04-14 MED ORDER — PANTOPRAZOLE SODIUM 40 MG PO TBEC: 40.0000 mg | DELAYED_RELEASE_TABLET | Freq: Every day | ORAL | 0 refills | Status: DC

## 2018-04-14 MED ORDER — PHENYLEPHRINE HCL 10 MG/ML IJ SOLN
INTRAMUSCULAR | Status: DC | PRN
  Administered 2018-04-14 (×2): 80 ug via INTRAVENOUS

## 2018-04-14 MED ORDER — HEPARIN SODIUM (PORCINE) 1000 UNIT/ML IJ SOLN
INTRAMUSCULAR | Status: DC | PRN
  Administered 2018-04-14: 2000 [IU] via INTRAVENOUS
  Administered 2018-04-14: 3000 [IU] via INTRAVENOUS

## 2018-04-14 MED ORDER — HEPARIN SODIUM (PORCINE) 1000 UNIT/ML IJ SOLN
INTRAMUSCULAR | Status: AC
  Filled 2018-04-14: qty 1

## 2018-04-14 MED ORDER — DEXAMETHASONE SODIUM PHOSPHATE 10 MG/ML IJ SOLN
INTRAMUSCULAR | Status: DC | PRN
  Administered 2018-04-14: 5 mg via INTRAVENOUS

## 2018-04-14 MED ORDER — SODIUM CHLORIDE 0.9 % IV SOLN: 250.0000 mL | INTRAVENOUS | Status: DC | PRN

## 2018-04-14 MED ORDER — HEPARIN (PORCINE) IN NACL 1000-0.9 UT/500ML-% IV SOLN
INTRAVENOUS | Status: DC | PRN
  Administered 2018-04-14: 500 mL

## 2018-04-14 MED ORDER — SODIUM CHLORIDE 0.9 % IV SOLN
INTRAVENOUS | Status: DC | PRN
  Administered 2018-04-14: 15 ug/min via INTRAVENOUS

## 2018-04-14 MED ORDER — PROPOFOL 10 MG/ML IV BOLUS
INTRAVENOUS | Status: DC | PRN
  Administered 2018-04-14: 100 mg via INTRAVENOUS

## 2018-04-14 MED ORDER — FENTANYL CITRATE (PF) 100 MCG/2ML IJ SOLN
INTRAMUSCULAR | Status: DC | PRN
  Administered 2018-04-14 (×2): 50 ug via INTRAVENOUS

## 2018-04-14 MED ORDER — BUPIVACAINE HCL (PF) 0.25 % IJ SOLN
INTRAMUSCULAR | Status: DC | PRN
  Administered 2018-04-14: 20 mL

## 2018-04-14 SURGICAL SUPPLY — 18 items
BLANKET WARM UNDERBOD FULL ACC (MISCELLANEOUS) ×3 IMPLANT
CATH MAPPNG PENTARAY F 2-6-2MM (CATHETERS) IMPLANT
CATH NAVISTAR SMARTTOUCH DF (ABLATOR) ×2 IMPLANT
CATH SOUNDSTAR 3D IMAGING (CATHETERS) ×3 IMPLANT
CATH WEBSTER BI DIR CS D-F CRV (CATHETERS) ×3 IMPLANT
COVER SWIFTLINK CONNECTOR (BAG) ×2 IMPLANT
NDL BAYLIS TRANSSEPTAL 71CM (NEEDLE) IMPLANT
NEEDLE BAYLIS TRANSSEPTAL 71CM (NEEDLE) ×3 IMPLANT
PACK EP LATEX FREE (CUSTOM PROCEDURE TRAY) ×3
PACK EP LF (CUSTOM PROCEDURE TRAY) ×1 IMPLANT
PAD DEFIB LIFELINK (PAD) ×2 IMPLANT
PATCH CARTO3 (PAD) ×2 IMPLANT
PENTARAY F 2-6-2MM (CATHETERS) ×3
SHEATH AVANTI 11F 11CM (SHEATH) ×2 IMPLANT
SHEATH PINNACLE 7F 10CM (SHEATH) ×4 IMPLANT
SHEATH PINNACLE 9F 10CM (SHEATH) ×3 IMPLANT
SHEATH SWARTZ TS SL2 63CM 8.5F (SHEATH) ×3 IMPLANT
TUBING SMART ABLATE COOLFLOW (TUBING) ×2 IMPLANT

## 2018-04-14 NOTE — Anesthesia Postprocedure Evaluation (Signed)
Anesthesia Post Note  Patient: Patty [name]  Procedure(s) Performed: ATRIAL FIBRILLATION ABLATION (N/A )     Patient location during evaluation: PACU Anesthesia Type: General Level of consciousness: awake and alert and oriented Pain management: pain level controlled Vital Signs Assessment: post-procedure vital signs reviewed and stable Respiratory status: spontaneous breathing, nonlabored ventilation and respiratory function stable Cardiovascular status: stable and blood pressure returned to baseline Postop Assessment: no apparent nausea or vomiting Anesthetic complications: no    Last Vitals:  Vitals:   04/14/18 1125 04/14/18 1130  BP: 129/79 137/70  Pulse: 78 79  Resp: 14 (!) 8  Temp:    SpO2: 98% 97%    Last Pain:  Vitals:   04/14/18 1003  TempSrc:   PainSc: 0-No pain                 Brayson Livesey A.

## 2018-04-14 NOTE — Discharge Instructions (Signed)
Post procedure care instructions No driving for 4 days. No lifting over 5 lbs for 1 week. No vigorous or sexual activity for 1 week. You may return to work on 04/21/2018. Keep procedure site clean & dry. If you notice increased pain, swelling, bleeding or pus, call/return!  You may shower, but no soaking baths/hot tubs/pools for 1 week.     Femoral Site Care This sheet gives you information about how to care for yourself after your procedure. Your health care provider may also give you more specific instructions. If you have problems or questions, contact your health care provider. What can I expect after the procedure? After the procedure, it is common to have:  Bruising that usually fades within 1-2 weeks.  Tenderness at the site. Follow these instructions at home: Wound care  Follow instructions from your health care provider about how to take care of your insertion site. Make sure you: ? Wash your hands with soap and water before you change your bandage (dressing). If soap and water are not available, use hand sanitizer. ? Change your dressing as told by your health care provider. ? Leave stitches (sutures), skin glue, or adhesive strips in place. These skin closures may need to stay in place for 2 weeks or longer. If adhesive strip edges start to loosen and curl up, you may trim the loose edges. Do not remove adhesive strips completely unless your health care provider tells you to do that.  Do not take baths, swim, or use a hot tub until your health care provider approves.  You may shower 24-48 hours after the procedure or as told by your health care provider. ? Gently wash the site with plain soap and water. ? Pat the area dry with a clean towel. ? Do not rub the site. This may cause bleeding.  Do not apply powder or lotion to the site. Keep the site clean and dry.  Check your femoral site every day for signs of infection. Check for: ? Redness, swelling, or pain. ? Fluid or  blood. ? Warmth. ? Pus or a bad smell. Activity  For the first 2-3 days after your procedure, or as long as directed: ? Avoid climbing stairs as much as possible. ? Do not squat.  Do not lift anything that is heavier than 10 lb (4.5 kg), or the limit that you are told, until your health care provider says that it is safe.  Rest as directed. ? Avoid sitting for a long time without moving. Get up to take short walks every 1-2 hours.  Do not drive for 24 hours if you were given a medicine to help you relax (sedative). General instructions  Take over-the-counter and prescription medicines only as told by your health care provider.  Keep all follow-up visits as told by your health care provider. This is important. Contact a health care provider if you have:  A fever or chills.  You have redness, swelling, or pain around your insertion site. Get help right away if:  The catheter insertion area swells very fast.  You pass out.  You suddenly start to sweat or your skin gets clammy.  The catheter insertion area is bleeding, and the bleeding does not stop when you hold steady pressure on the area.  The area near or just beyond the catheter insertion site becomes pale, cool, tingly, or numb. These symptoms may represent a serious problem that is an emergency. Do not wait to see if the symptoms will go away.  Get medical help right away. Call your local emergency services (911 in the [place] Do not drive yourself to the hospital. Summary  After the procedure, it is common to have bruising that usually fades within 1-2 weeks.  Check your femoral site every day for signs of infection.  Do not lift anything that is heavier than 10 lb (4.5 kg), or the limit that you are told, until your health care provider says that it is safe. This information is not intended to replace advice given to you by your health care provider. Make sure you discuss any questions you have with your health care  provider. Document Released: 10/19/2013 Document Revised: 02/28/2017 Document Reviewed: 02/28/2017 Elsevier Interactive Patient Education  2019 [place] have an appointment set up with the [facility].  Multiple studies have shown that being followed by a dedicated atrial fibrillation clinic in addition to the standard care you receive from your other physicians improves health. We believe that enrollment in the atrial fibrillation clinic will allow [place] to better care for you.   The phone number to the [facility] is [phone]. The clinic is staffed Monday through Friday from 8:30am to 5pm.  Parking Directions: The clinic is located in the Heart and Vascular Building connected to [facility]. 1)From [address] turn on to [address] and go to the 3rd entrance  (Heart and Vascular entrance) on the right. 2)Look to the right for Heart &Vascular Parking Garage. 3)A code for the entrance is required please call the clinic to receive this.   4)Take the elevators to the 1st floor. Registration is in the room with the glass walls at the end of the hallway.  If you have any trouble parking or locating the clinic, please dont hesitate to call [phone].

## 2018-04-14 NOTE — Interval H&P Note (Signed)
History and Physical Interval Note:  04/14/2018 7:18 AM  Patty [name]  has presented today for surgery, with the diagnosis of afib  The various methods of treatment have been discussed with the patient and family. After consideration of risks, benefits and other options for treatment, the patient has consented to  Procedure(s): ATRIAL FIBRILLATION ABLATION (N/A) as a surgical intervention .  The patient's history has been reviewed, patient examined, no change in status, stable for surgery.  I have reviewed the patient's chart and labs.  Questions were answered to the patient's satisfaction.    Cardiac CT reviewed with her today.  She reports compliance with xarelto without interruption.  Thompson Grayer MD, [facility] 04/14/2018 7:19 AM

## 2018-04-14 NOTE — Anesthesia Procedure Notes (Addendum)
Procedure Name: Intubation Date/Time: 04/14/2018 7:40 AM Performed by: Leonor Liv, CRNA Pre-anesthesia Checklist: Patient identified, Emergency Drugs available, Suction available and Patient being monitored Patient Re-evaluated:Patient Re-evaluated prior to induction Oxygen Delivery Method: Circle System Utilized Preoxygenation: Pre-oxygenation with 100% oxygen Induction Type: IV induction Ventilation: Mask ventilation without difficulty and Oral airway inserted - appropriate to patient size Laryngoscope Size: Mac and 3 Grade View: Grade III Tube type: Oral Tube size: 7.0 mm Number of attempts: 1 Airway Equipment and Method: Stylet and Oral airway Placement Confirmation: ETT inserted through vocal cords under direct vision,  positive ETCO2 and breath sounds checked- equal and bilateral Secured at: 21 cm Tube secured with: Tape (left) Dental Injury: Teeth and Oropharynx as per pre-operative assessment  Comments: Right corner of mouth with small wound caused by opening mouth during DL (dry skin). Grade II view with cricoid pressure.

## 2018-04-14 NOTE — Transfer of Care (Signed)
Immediate Anesthesia Transfer of Care Note  Patient: Patty [name]  Procedure(s) Performed: ATRIAL FIBRILLATION ABLATION (N/A )  Patient Location: Cath Lab  Anesthesia Type:General  Level of Consciousness: awake, alert  and oriented  Airway & Oxygen Therapy: Patient Spontanous Breathing and Patient connected to nasal cannula oxygen  Post-op Assessment: Report given to RN, Post -op Vital signs reviewed and stable and Patient moving all extremities  Post vital signs: Reviewed and stable  Last Vitals:  Vitals Value Taken Time  BP 127/62 04/14/2018 10:05 AM  Temp    Pulse 76 04/14/2018 10:05 AM  Resp 20 04/14/2018 10:05 AM  SpO2 100 % 04/14/2018 10:05 AM  Vitals shown include unvalidated device data.  Last Pain:  Vitals:   04/14/18 0625  TempSrc:   PainSc: 0-No pain      Patients Stated Pain Goal: 3 (04/29/29 4388)  Complications: No apparent anesthesia complications

## 2018-04-14 NOTE — Progress Notes (Signed)
Site area: Right groin a 7, 9, 11 french venous sheath was removed  Site Prior to Removal:  Level 0  Pressure Applied For 20 MINUTES    Bedrest Beginning at 1145am  Manual:   Yes.    Patient Status During Pull:  stable  Post Pull Groin Site:  Level 0  Post Pull Instructions Given:  Yes.    Post Pull Pulses Present:  Yes.    Dressing Applied:  Yes.    Comments:  VS remain stable

## 2018-04-14 NOTE — Progress Notes (Signed)
No bleeding or hematoma noted after ambulation 

## 2018-04-17 ENCOUNTER — Encounter (HOSPITAL_COMMUNITY): Payer: Self-pay | Admitting: Internal Medicine

## 2018-04-21 DIAGNOSIS — J069 Acute upper respiratory infection, unspecified: Secondary | ICD-10-CM | POA: Diagnosis not present

## 2018-04-21 DIAGNOSIS — R0602 Shortness of breath: Secondary | ICD-10-CM | POA: Diagnosis not present

## 2018-04-24 DIAGNOSIS — D649 Anemia, unspecified: Secondary | ICD-10-CM | POA: Diagnosis not present

## 2018-04-25 ENCOUNTER — Telehealth: Payer: Self-pay | Admitting: Internal Medicine

## 2018-04-25 ENCOUNTER — Ambulatory Visit (HOSPITAL_COMMUNITY)
Admission: RE | Admit: 2018-04-25 | Discharge: 2018-04-25 | Disposition: A | Discharge status: Home / Self Care | Payer: Medicare Other | Source: Ambulatory Visit | Attending: Nurse Practitioner | Admitting: Nurse Practitioner

## 2018-04-25 ENCOUNTER — Encounter (HOSPITAL_COMMUNITY): Payer: Self-pay | Admitting: Nurse Practitioner

## 2018-04-25 VITALS — BP 130/72 | HR 80 | Ht 66.5 in | Wt 145.0 lb

## 2018-04-25 DIAGNOSIS — Z8249 Family history of ischemic heart disease and other diseases of the circulatory system: Secondary | ICD-10-CM | POA: Insufficient documentation

## 2018-04-25 DIAGNOSIS — I4892 Unspecified atrial flutter: Secondary | ICD-10-CM | POA: Insufficient documentation

## 2018-04-25 DIAGNOSIS — R0609 Other forms of dyspnea: Secondary | ICD-10-CM | POA: Diagnosis not present

## 2018-04-25 DIAGNOSIS — Z79899 Other long term (current) drug therapy: Secondary | ICD-10-CM | POA: Diagnosis not present

## 2018-04-25 DIAGNOSIS — I480 Paroxysmal atrial fibrillation: Secondary | ICD-10-CM | POA: Insufficient documentation

## 2018-04-25 DIAGNOSIS — Z79890 Hormone replacement therapy (postmenopausal): Secondary | ICD-10-CM | POA: Insufficient documentation

## 2018-04-25 DIAGNOSIS — Z823 Family history of stroke: Secondary | ICD-10-CM | POA: Insufficient documentation

## 2018-04-25 DIAGNOSIS — Z8572 Personal history of non-Hodgkin lymphomas: Secondary | ICD-10-CM | POA: Diagnosis not present

## 2018-04-25 DIAGNOSIS — Z9011 Acquired absence of right breast and nipple: Secondary | ICD-10-CM | POA: Diagnosis not present

## 2018-04-25 DIAGNOSIS — E1122 Type 2 diabetes mellitus with diabetic chronic kidney disease: Secondary | ICD-10-CM | POA: Insufficient documentation

## 2018-04-25 DIAGNOSIS — J45909 Unspecified asthma, uncomplicated: Secondary | ICD-10-CM | POA: Insufficient documentation

## 2018-04-25 DIAGNOSIS — G4730 Sleep apnea, unspecified: Secondary | ICD-10-CM | POA: Insufficient documentation

## 2018-04-25 DIAGNOSIS — Z803 Family history of malignant neoplasm of breast: Secondary | ICD-10-CM | POA: Diagnosis not present

## 2018-04-25 DIAGNOSIS — N189 Chronic kidney disease, unspecified: Secondary | ICD-10-CM | POA: Insufficient documentation

## 2018-04-25 DIAGNOSIS — Z91013 Allergy to seafood: Secondary | ICD-10-CM | POA: Diagnosis not present

## 2018-04-25 DIAGNOSIS — Z7984 Long term (current) use of oral hypoglycemic drugs: Secondary | ICD-10-CM | POA: Insufficient documentation

## 2018-04-25 DIAGNOSIS — I129 Hypertensive chronic kidney disease with stage 1 through stage 4 chronic kidney disease, or unspecified chronic kidney disease: Secondary | ICD-10-CM | POA: Insufficient documentation

## 2018-04-25 DIAGNOSIS — Z885 Allergy status to narcotic agent status: Secondary | ICD-10-CM | POA: Diagnosis not present

## 2018-04-25 DIAGNOSIS — Z7901 Long term (current) use of anticoagulants: Secondary | ICD-10-CM | POA: Insufficient documentation

## 2018-04-25 DIAGNOSIS — Z853 Personal history of malignant neoplasm of breast: Secondary | ICD-10-CM | POA: Diagnosis not present

## 2018-04-25 DIAGNOSIS — Z880 Allergy status to penicillin: Secondary | ICD-10-CM | POA: Insufficient documentation

## 2018-04-25 DIAGNOSIS — Z85820 Personal history of malignant melanoma of skin: Secondary | ICD-10-CM | POA: Insufficient documentation

## 2018-04-25 DIAGNOSIS — Z888 Allergy status to other drugs, medicaments and biological substances status: Secondary | ICD-10-CM | POA: Diagnosis not present

## 2018-04-25 DIAGNOSIS — E7800 Pure hypercholesterolemia, unspecified: Secondary | ICD-10-CM | POA: Insufficient documentation

## 2018-04-25 NOTE — Telephone Encounter (Signed)
New Message   Pt c/o Shortness Of Breath: STAT if SOB developed within the last 24 hours or pt is noticeably SOB on the phone  1. Are you currently SOB (can you hear that pt is SOB on the phone)? Yes  2. How long have you been experiencing SOB? Patient states it comes and goes and started Saturday morning   3. Are you SOB when sitting or when up moving around? Just when moving around  4. Are you currently experiencing any other symptoms? No

## 2018-04-25 NOTE — Telephone Encounter (Signed)
Pt was receptive to being seen today by afib clinic, appointment made with Roderic Palau NP.

## 2018-04-25 NOTE — Patient Instructions (Signed)
Maxide - take 1 tablet by mouth daily for the next 3 days.  Call Friday with report of how you are feeling.

## 2018-04-25 NOTE — Telephone Encounter (Signed)
Spoke with pt, slight SOB noted in conversation, denies edema in abdomen and legs, no pain, had cough prior to ablation 04/14/2018, worsens with walking, states pulse seems regular. Had OV with Dr Concha Pyo for asthma and he said she did not have signs of pneumonia but did note fluid around one lung and to call Dr Rayann Heman with continued concerns. Please return call with further advice.

## 2018-04-25 NOTE — Progress Notes (Signed)
Primary Care Physician: Deland Pretty, MD Referring Physician/EP: Dr. Rayann Heman Cardiologist: Dr. August Saucer Patty [name] is a 78 y.o. female with a h/o  paroxysmal afib that had afib ablation 2/14 with Dr. Rayann Heman. She feels that she has been in SR since the procedure but has had intermittent shortness of breath.  She felt great on Sunday and tired yesterday but short of breath with exertion today. She saw her PCP recently and he obtained an xray  (results requested). The pt states that he did not see pneumonia. She denies any fever/chills, a cough for several weeks but no change in frequency or severity. She has not noted any pedal edema. Weight day of ablation 139.7 lbs, weight today 144.76 lbs. She did take Maxzide for years on a daily  basis. Has not taken any of  this drug for a while. Usually goes by presence of ankle edema to take as needed.   Today, she denies symptoms of palpitations, chest pain, shortness of breath, orthopnea, PND, lower extremity edema, dizziness, presyncope, syncope, or neurologic sequela. The patient is tolerating medications without difficulties and is otherwise without complaint today.   Past Medical History:  Diagnosis Date  . Arthritis   . Asthma   . Atrial flutter (HCC)    PAROXYSMAL, EKG 10/06/2017, ATRIAL FLUTTER (ATYPICAL) WITH VARIABLE VENTRICULAR RESPONCE AND CONTROLLED  . Breast cancer, right ([employer]) 2/17   R sided, s/p total mastectomy  . Bulging lumbar disc    3  . Chronic kidney disease    recurring UTI  . Complication of anesthesia    "morphine made me stop breathing"  . Concentric left ventricular hypertrophy    MILD DECREASE IN GLOBAL WALL MOTION. DOPPLER EVIDENCE OF GRADE II (PSEUDONORMAL) DIASTOLIC DYSFUNCTION, ELEVATED LAP, CALCULATED EF 45%, LEFT ATRIAL CAVITY IS MILDLY DILATED. MILD TO MODERATE AORTIC REGURGITATION. MILD (GRADE I) MITRAL REGURGITATION. MILD TRICUSPID REGURGITATION. ESTIMATED PULMONARY ARTERY SYSTOLIC PRESSURE 22 MMHG.  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 First degree AV block   . HCAP (healthcare-associated pneumonia) 12/2013   Archie Endo 01/18/2014  . History of skin cancer   . History of transfusion   . Hypertension   . Hypothyroidism   . Left atrial enlargement   . Leg swelling   . 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 Malaise and fatigue   . Nocturia   . Paroxysmal atrial fibrillation (HCC)   . Persistent atrial fibrillation   . Pure hypercholesterolemia   . Shortness of breath dyspnea    "at times"  . Skin cancer   . Sleep apnea    uses c-pap  . Type II diabetes mellitus ([place])    Past Surgical History:  Procedure Laterality Date  . ABDOMINAL HYSTERECTOMY  1972   partial  . ABDOMINAL HYSTERECTOMY    . APPENDECTOMY  1953  . ATRIAL FIBRILLATION ABLATION  08/19/2015   PVI with CTI ablation by Dr Rayann Heman  . ATRIAL FIBRILLATION ABLATION N/A 04/14/2018   Procedure: ATRIAL FIBRILLATION ABLATION;  Surgeon: Thompson Grayer, MD;  Location: [place];  Service: Cardiovascular;  Laterality: N/A;  . BACK SURGERY     x2  . BIOPSY  02/13/2018   Procedure: BIOPSY;  Surgeon: Ronnette Juniper, MD;  Location: [facility];  Service: Gastroenterology;;  X2=EGD & Colon  . BREAST SURGERY     Right mastectomy  . CARDIAC CATHETERIZATION  10/10/2008   Nonobstructive CAD  .  CARDIOVERSION  04/13/2011   Procedure: CARDIOVERSION;  Surgeon: Laverda Page, MD;  Location: [facility];  Service: Cardiovascular;  Laterality: N/A;  . CARDIOVERSION N/A 06/18/2015   Procedure: CARDIOVERSION;  Surgeon: Adrian Prows, MD;  Location: [facility];  Service: Cardiovascular;  Laterality: N/A;  . CARDIOVERSION N/A 10/26/2016   Procedure: CARDIOVERSION;  Surgeon: Adrian Prows, MD;  Location: [facility];  Service: Cardiovascular;  Laterality: N/A;  . CARDIOVERSION N/A 10/11/2017   Procedure: CARDIOVERSION;  Surgeon: Nigel Mormon, MD;  Location: [facility];   Service: Cardiovascular;  Laterality: N/A;  . CATARACT EXTRACTION W/ INTRAOCULAR LENS  IMPLANT, BILATERAL Bilateral   . CHOLECYSTECTOMY  1993  . COLONOSCOPY N/A 02/13/2018   Procedure: COLONOSCOPY;  Surgeon: Ronnette Juniper, MD;  Location: [facility];  Service: Gastroenterology;  Laterality: N/A;  . ELECTROPHYSIOLOGIC STUDY N/A 08/19/2015   Procedure: Atrial Fibrillation Ablation;  Surgeon: Thompson Grayer, MD;  Location: [place] [facility];  Service: Cardiovascular;  Laterality: N/A;  . ESOPHAGOGASTRODUODENOSCOPY N/A 02/13/2018   Procedure: ESOPHAGOGASTRODUODENOSCOPY (EGD);  Surgeon: Ronnette Juniper, MD;  Location: [facility];  Service: Gastroenterology;  Laterality: N/A;  . FOOT SURGERY    . IR REMOVAL TUN ACCESS W/ PORT W/O FL MOD SED  04/01/2017  . LUMBAR LAMINECTOMY    . MASS BIOPSY Left 12/07/2013   Procedure: EXCISIONAL BIOPSY LEFT NECK MASS ;  Surgeon: Jerrell Belfast, MD;  Location: [place];  Service: ENT;  Laterality: Left;  Marland Kitchen MASTECTOMY Right 2017   malignant  . POLYPECTOMY  02/13/2018   Procedure: POLYPECTOMY;  Surgeon: Ronnette Juniper, MD;  Location: [facility];  Service: Gastroenterology;;  EGD  . PORTACATH PLACEMENT  12/2013   Archie Endo 01/04/2014  . PUBOVAGINAL SLING    . SKIN TAG REMOVAL  2012   leg  . TEE WITHOUT CARDIOVERSION N/A 08/19/2015   Procedure: TRANSESOPHAGEAL ECHOCARDIOGRAM (TEE);  Surgeon: Larey [facility]er, MD;  Location: [place];  Service: Cardiovascular;  Laterality: N/A;  . TONSILLECTOMY    . TOTAL MASTECTOMY Right 05/27/2015   Procedure: RIGHT TOTAL MASTECTOMY;  Surgeon: Fanny Skates, MD;  Location: [facility];  Service: General;  Laterality: Right;    Current Outpatient Medications  Medication Sig Dispense Refill  . albuterol (PROVENTIL HFA;VENTOLIN HFA) 108 (90 Base) MCG/ACT inhaler Inhale 1-2 puffs into the lungs every 6 (six) hours as needed for wheezing or shortness of breath.     . anastrozole (ARIMIDEX) 1 MG tablet TAKE ONE TABLET BY MOUTH EVERY DAY (Patient  taking differently: Take 1 mg by mouth daily. ) 90 tablet 9  . Cholecalciferol (VITAMIN D) 2000 UNITS tablet Take 2,000 Units by mouth daily with lunch.    . Cyanocobalamin (B-12) 2500 MCG TABS Take 2,500 mcg by mouth daily with lunch.     . Fluticasone-Salmeterol (ADVAIR) 250-50 MCG/DOSE AEPB Inhale 1 puff into the lungs every 12 (twelve) hours.     . gabapentin (NEURONTIN) 300 MG capsule Take 600 mg by mouth at bedtime.     Marland Kitchen levothyroxine (SYNTHROID, LEVOTHROID) 200 MCG tablet Take 200 mcg by mouth daily before breakfast.    . Magnesium Oxide 500 MG TABS Take 500 mg by mouth 2 (two) times daily.     . metFORMIN (GLUCOPHAGE-XR) 500 MG 24 hr tablet Take 500 mg by mouth 2 (two) times daily with a meal.    . montelukast (SINGULAIR) 10 MG tablet Take 10 mg by mouth daily.    Marland Kitchen omega-3 acid ethyl esters (LOVAZA) 1 g capsule Take 1 g by mouth  2 (two) times daily.    . pantoprazole (PROTONIX) 40 MG tablet Take 1 tablet (40 mg total) by mouth daily. 45 tablet 0  . pravastatin (PRAVACHOL) 40 MG tablet Take 20 mg by mouth See admin instructions. Take 0.5 tablet (20 mg) by mouth on Mondays through Thursdays.  0  . PROLIA 60 MG/ML SOSY injection Inject 60 mg into the vein every 6 (six) months.    . rivaroxaban (XARELTO) 20 MG TABS tablet Take 20 mg by mouth daily with supper.    . traMADol-acetaminophen (ULTRACET) 37.5-325 MG tablet Take 1 tablet by mouth every 6 (six) hours as needed (pain).    . triamterene-hydrochlorothiazide (MAXZIDE-25) 37.5-25 MG tablet Take 1 tablet by mouth daily as needed (for fluid retention/swelling.).     Marland Kitchen benzonatate (TESSALON) 200 MG capsule Take 200 mg by mouth 3 (three) times daily as needed for cough.      No current facility-administered medications for this encounter.     Allergies  Allergen Reactions  . Morphine And Related Anaphylaxis and Other (See Comments)    Pt states she stopped breathing post op- "went into resp. arrest"  . Multaq [Dronedarone] Other (See  Comments)    Reaction:  Blood in urine and elevated liver enzymes.   . Demerol Nausea And Vomiting  . Oysters [Shellfish Allergy] Rash    & CLAMS  . Penicillins Itching and Rash    Has patient had a PCN reaction causing immediate rash, facial/tongue/throat swelling, SOB or lightheadedness with hypotension: No Has patient had a PCN reaction causing severe rash involving mucus membranes or skin necrosis: Yes Has patient had a PCN reaction that required hospitalization No Has patient had a PCN reaction occurring within the last 10 years: No If all of the above answers are "NO", then may proceed with Cephalosporin use.   [facility] Reactors Rash    Social History   Socioeconomic History  . Marital status: Married    Spouse name: Not on file  . Number of children: 2  . Years of education: Not on file  . Highest education level: Not on file  Occupational History  . Not on file  Social Needs  . Financial resource strain: Not on file  . Food insecurity:    Worry: Not on file    Inability: Not on file  . Transportation needs:    Medical: Not on file    Non-medical: Not on file  Tobacco Use  . Smoking status: Never Smoker  . Smokeless tobacco: Never Used  Substance and Sexual Activity  . Alcohol use: No    Alcohol/week: 0.0 standard drinks  . Drug use: No  . Sexual activity: Not Currently    Birth control/protection: Surgical  Lifestyle  . Physical activity:    Days per week: Not on file    Minutes per session: Not on file  . Stress: Not on file  Relationships  . Social connections:    Talks on phone: Not on file    Gets together: Not on file    Attends religious service: Not on file    Active member of club or organization: Not on file    Attends meetings of clubs or organizations: Not on file    Relationship status: Not on file  . Intimate partner violence:    Fear of current or ex partner: Not on file    Emotionally abused: Not on file    Physically abused:  Not on file  Forced sexual activity: Not on file  Other Topics Concern  . Not on file  Social History Narrative   Pt lives in [place] with spouse.   Retired Therapist, sports.  Previously worked in [employer].    Family History  Problem Relation Age of Onset  . Stroke Father 47  . Heart attack Father 74  . Hypertension Father   . Heart disease Mother   . Arthritis Mother   . Breast cancer Mother 35  . Heart failure Mother   . Heart attack Brother   . Hypertension Brother   . Diabetes Sister   . Cancer Sister 28    ROS- All systems are reviewed and negative except as per the HPI above  Physical Exam: Vitals:   04/25/18 1600  BP: 130/72  Pulse: 80  SpO2: 97%  Weight: 65.8 kg  Height: 5' 6.5" (1.689 m)   Wt Readings from Last 3 Encounters:  04/25/18 65.8 kg  04/14/18 63.5 kg  03/23/18 66.1 kg    Labs: Lab Results  Component Value Date   NA 142 03/23/2018   K 4.5 03/23/2018   CL 105 03/23/2018   CO2 28 03/23/2018   GLUCOSE 121 (H) 03/23/2018   BUN 9 03/23/2018   CREATININE 0.70 03/23/2018   CALCIUM 9.5 03/23/2018   PHOS 2.6 03/23/2015   MG 1.5 (L) 04/28/2015   Lab Results  Component Value Date   INR 1.19 12/19/2017   Lab Results  Component Value Date   CHOL  05/05/2010    128        ATP III CLASSIFICATION:  <200     mg/dL   Desirable  200-239  mg/dL   Borderline High  >=240    mg/dL   High          HDL 44 05/05/2010   LDLCALC  05/05/2010    51        Total Cholesterol/HDL:CHD Risk Coronary Heart Disease Risk Table                     Men   Women  1/2 Average Risk   3.4   3.3  Average Risk       5.0   4.4  2 X Average Risk   9.6   7.1  3 X Average Risk  23.4   11.0        Use the calculated Patient Ratio above and the CHD Risk Table to determine the patient's CHD Risk.        ATP III CLASSIFICATION (LDL):  <100     mg/dL   Optimal  100-129  mg/dL   Near or Above                    Optimal  130-159  mg/dL   Borderline  160-189  mg/dL   High   >190     mg/dL   Very High   TRIG 163 (H) 05/05/2010     GEN- The patient is well appearing, alert and oriented x 3 today.   Head- normocephalic, atraumatic Eyes-  Sclera clear, conjunctiva pink Ears- hearing intact Oropharynx- clear Neck- supple, no JVP Lymph- no cervical lymphadenopathy Lungs- Clear to ausculation bilaterally, normal work of breathing Heart- Regular rate and rhythm, no murmurs, rubs or gallops, PMI not laterally displaced GI- soft, NT, ND, + BS Extremities- no clubbing, cyanosis, or edema MS- no significant deformity or atrophy Skin- no rash or lesion Psych- euthymic mood, full  affect Neuro- strength and sensation are intact  EKG-SR with first degree AV block, right superior axis deviation, IRBBB, RVH  Cardiac CT results reviewed which did show pulmonary htn   Assessment and Plan: 1. S/p afib ablation  In SR today and pt feels she is has been in SR since the procedure C/o intermittent exertional shortness of breath, no PND/orthopnea Comparison of procedure weight til now, shows weight is up a few lbs and per pt recent CXR showed some fluid(results requested, not in Epic ) She has Maxzide  to use as needed Will get her to take for the next 3 am's and report her symptoms Friday Call sooner if symptoms worsen   Butch Penny C. Ellee Wawrzyniak, [facility] [address] [place], [place] [address] [phone]

## 2018-04-27 ENCOUNTER — Telehealth (HOSPITAL_COMMUNITY): Payer: Self-pay | Admitting: Nurse Practitioner

## 2018-04-27 NOTE — Telephone Encounter (Signed)
I called to check on pt and her previous c/o  of shortness of breath. I received her cxr and it suggested atelectais vrs developing pneumonia. She reports  that her weight is now stable and she feels the shortness of breath is improving. She is doing more house work than the week before. She is not having any fever/chills, cough has improved.  She will take Maxzide tomorrow am( makes 3 days) and then go back to prn. She will let me know if her symptoms do not continue to improve.

## 2018-05-10 DIAGNOSIS — N3020 Other chronic cystitis without hematuria: Secondary | ICD-10-CM | POA: Diagnosis not present

## 2018-05-10 DIAGNOSIS — N952 Postmenopausal atrophic vaginitis: Secondary | ICD-10-CM | POA: Diagnosis not present

## 2018-05-10 DIAGNOSIS — N312 Flaccid neuropathic bladder, not elsewhere classified: Secondary | ICD-10-CM | POA: Diagnosis not present

## 2018-05-10 DIAGNOSIS — N3000 Acute cystitis without hematuria: Secondary | ICD-10-CM | POA: Diagnosis not present

## 2018-05-12 ENCOUNTER — Encounter (HOSPITAL_COMMUNITY): Payer: Self-pay | Admitting: Nurse Practitioner

## 2018-05-12 ENCOUNTER — Ambulatory Visit (HOSPITAL_COMMUNITY)
Admission: RE | Admit: 2018-05-12 | Discharge: 2018-05-12 | Disposition: A | Discharge status: Home / Self Care | Payer: Medicare Other | Source: Ambulatory Visit | Attending: Nurse Practitioner | Admitting: Nurse Practitioner

## 2018-05-12 ENCOUNTER — Other Ambulatory Visit: Payer: Self-pay

## 2018-05-12 VITALS — BP 126/76 | HR 114 | Ht 66.5 in | Wt 143.0 lb

## 2018-05-12 DIAGNOSIS — M1990 Unspecified osteoarthritis, unspecified site: Secondary | ICD-10-CM | POA: Diagnosis not present

## 2018-05-12 DIAGNOSIS — E1122 Type 2 diabetes mellitus with diabetic chronic kidney disease: Secondary | ICD-10-CM | POA: Diagnosis not present

## 2018-05-12 DIAGNOSIS — Z8249 Family history of ischemic heart disease and other diseases of the circulatory system: Secondary | ICD-10-CM | POA: Diagnosis not present

## 2018-05-12 DIAGNOSIS — I129 Hypertensive chronic kidney disease with stage 1 through stage 4 chronic kidney disease, or unspecified chronic kidney disease: Secondary | ICD-10-CM | POA: Insufficient documentation

## 2018-05-12 DIAGNOSIS — N189 Chronic kidney disease, unspecified: Secondary | ICD-10-CM | POA: Diagnosis not present

## 2018-05-12 DIAGNOSIS — Z7984 Long term (current) use of oral hypoglycemic drugs: Secondary | ICD-10-CM | POA: Insufficient documentation

## 2018-05-12 DIAGNOSIS — I484 Atypical atrial flutter: Secondary | ICD-10-CM

## 2018-05-12 DIAGNOSIS — I480 Paroxysmal atrial fibrillation: Secondary | ICD-10-CM | POA: Diagnosis not present

## 2018-05-12 DIAGNOSIS — E039 Hypothyroidism, unspecified: Secondary | ICD-10-CM | POA: Insufficient documentation

## 2018-05-12 DIAGNOSIS — G4730 Sleep apnea, unspecified: Secondary | ICD-10-CM | POA: Insufficient documentation

## 2018-05-12 DIAGNOSIS — J45909 Unspecified asthma, uncomplicated: Secondary | ICD-10-CM | POA: Diagnosis not present

## 2018-05-12 DIAGNOSIS — E7800 Pure hypercholesterolemia, unspecified: Secondary | ICD-10-CM | POA: Diagnosis not present

## 2018-05-12 DIAGNOSIS — Z79899 Other long term (current) drug therapy: Secondary | ICD-10-CM | POA: Diagnosis not present

## 2018-05-12 DIAGNOSIS — I4892 Unspecified atrial flutter: Secondary | ICD-10-CM | POA: Insufficient documentation

## 2018-05-12 MED ORDER — DILTIAZEM HCL ER COATED BEADS 120 MG PO CP24: 120.0000 mg | ORAL_CAPSULE | Freq: Every day | ORAL | 3 refills | Status: DC

## 2018-05-12 NOTE — Patient Instructions (Signed)
Start cardizem 120mg once a day 

## 2018-05-12 NOTE — Progress Notes (Signed)
Primary Care Physician: Deland Pretty, MD Referring Physician/EP: Dr. Rayann Heman Cardiologist: Dr. August Saucer Patty [name] is a 78 y.o. female with a h/o  paroxysmal afib that had afib ablation 04/14/18 with Dr. Rayann Heman, (first ablation 2017).. She feels that she has been in SR since the procedure but has had intermittent shortness of breath.  She felt great on Sunday and tired yesterday but short of breath with exertion today. She saw her PCP recently and he obtained an xray  (results requested). The pt states that he did not see pneumonia. She denies any fever/chills, a cough for several weeks but no change in frequency or severity. She has not noted any pedal edema. Weight day of ablation 139.7 lbs, weight today 144.76 lbs. She did take Maxzide for years on a daily  basis. Has not taken any of  this drug for a while. Usually goes by presence of ankle edema to take as needed.   F/u in afib clinic, 3/13, now one month out from procedure. The shortness of breath she had right after the procedure went away for a while, but has noted more shortness of breath for the last several days. Her HR has  also been higher. EKG shows atrial flutter at 114 bpm.   Today, she denies symptoms of palpitations, chest pain, shortness of breath, orthopnea, PND, lower extremity edema, dizziness, presyncope, syncope, or neurologic sequela. The patient is tolerating medications without difficulties and is otherwise without complaint today.   Past Medical History:  Diagnosis Date  . Arthritis   . Asthma   . Atrial flutter (HCC)    PAROXYSMAL, EKG 10/06/2017, ATRIAL FLUTTER (ATYPICAL) WITH VARIABLE VENTRICULAR RESPONCE AND CONTROLLED  . Breast cancer, right ([place]) 2/17   R sided, s/p total mastectomy  . Bulging lumbar disc    3  . Chronic kidney disease    recurring UTI  . Complication of anesthesia    "morphine made me stop breathing"  . Concentric left ventricular hypertrophy    MILD DECREASE IN GLOBAL WALL MOTION.  DOPPLER EVIDENCE OF GRADE II (PSEUDONORMAL) DIASTOLIC DYSFUNCTION, ELEVATED LAP, CALCULATED EF 45%, LEFT ATRIAL CAVITY IS MILDLY DILATED. MILD TO MODERATE AORTIC REGURGITATION. MILD (GRADE I) MITRAL REGURGITATION. MILD TRICUSPID REGURGITATION. ESTIMATED PULMONARY ARTERY SYSTOLIC PRESSURE 22 MMHG.  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 First degree AV block   . HCAP (healthcare-associated pneumonia) 12/2013   Archie Endo 01/18/2014  . History of skin cancer   . History of transfusion   . Hypertension   . Hypothyroidism   . Left atrial enlargement   . Leg swelling   . 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 Malaise and fatigue   . Nocturia   . Paroxysmal atrial fibrillation (HCC)   . Persistent atrial fibrillation   . Pure hypercholesterolemia   . Shortness of breath dyspnea    "at times"  . Skin cancer   . Sleep apnea    uses c-pap  . Type II diabetes mellitus ([place])    Past Surgical History:  Procedure Laterality Date  . ABDOMINAL HYSTERECTOMY  1972   partial  . ABDOMINAL HYSTERECTOMY    . APPENDECTOMY  1953  . ATRIAL FIBRILLATION ABLATION  08/19/2015   PVI with CTI ablation by Dr Rayann Heman  . ATRIAL FIBRILLATION ABLATION N/A 04/14/2018   Procedure: ATRIAL FIBRILLATION ABLATION;  Surgeon: Thompson Grayer, MD;  Location: [facility];  Service: Cardiovascular;  Laterality: N/A;  . BACK SURGERY     x2  . BIOPSY  02/13/2018   Procedure: BIOPSY;  Surgeon: Ronnette Juniper, MD;  Location: [facility];  Service: Gastroenterology;;  X2=EGD & Colon  . BREAST SURGERY     Right mastectomy  . CARDIAC CATHETERIZATION  10/10/2008   Nonobstructive CAD  . CARDIOVERSION  04/13/2011   Procedure: CARDIOVERSION;  Surgeon: Laverda Page, MD;  Location: [facility];  Service: Cardiovascular;  Laterality: N/A;  . CARDIOVERSION N/A 06/18/2015   Procedure: CARDIOVERSION;  Surgeon: Adrian Prows, MD;  Location: [facility];  Service:  Cardiovascular;  Laterality: N/A;  . CARDIOVERSION N/A 10/26/2016   Procedure: CARDIOVERSION;  Surgeon: Adrian Prows, MD;  Location: [facility];  Service: Cardiovascular;  Laterality: N/A;  . CARDIOVERSION N/A 10/11/2017   Procedure: CARDIOVERSION;  Surgeon: Nigel Mormon, MD;  Location: [facility];  Service: Cardiovascular;  Laterality: N/A;  . CATARACT EXTRACTION W/ INTRAOCULAR LENS  IMPLANT, BILATERAL Bilateral   . CHOLECYSTECTOMY  1993  . COLONOSCOPY N/A 02/13/2018   Procedure: COLONOSCOPY;  Surgeon: Ronnette Juniper, MD;  Location: [facility];  Service: Gastroenterology;  Laterality: N/A;  . ELECTROPHYSIOLOGIC STUDY N/A 08/19/2015   Procedure: Atrial Fibrillation Ablation;  Surgeon: Thompson Grayer, MD;  Location: [facility];  Service: Cardiovascular;  Laterality: N/A;  . ESOPHAGOGASTRODUODENOSCOPY N/A 02/13/2018   Procedure: ESOPHAGOGASTRODUODENOSCOPY (EGD);  Surgeon: Ronnette Juniper, MD;  Location: [facility];  Service: Gastroenterology;  Laterality: N/A;  . FOOT SURGERY    . IR REMOVAL TUN ACCESS W/ PORT W/O FL MOD SED  04/01/2017  . LUMBAR LAMINECTOMY    . MASS BIOPSY Left 12/07/2013   Procedure: EXCISIONAL BIOPSY LEFT NECK MASS ;  Surgeon: Jerrell Belfast, MD;  Location: [facility];  Service: ENT;  Laterality: Left;  Marland Kitchen MASTECTOMY Right 2017   malignant  . POLYPECTOMY  02/13/2018   Procedure: POLYPECTOMY;  Surgeon: Ronnette Juniper, MD;  Location: [facility];  Service: Gastroenterology;;  EGD  . PORTACATH PLACEMENT  12/2013   Archie Endo 01/04/2014  . PUBOVAGINAL SLING    . SKIN TAG REMOVAL  2012   leg  . TEE WITHOUT CARDIOVERSION N/A 08/19/2015   Procedure: TRANSESOPHAGEAL ECHOCARDIOGRAM (TEE);  Surgeon: Larey Dresser, MD;  Location: [facility];  Service: Cardiovascular;  Laterality: N/A;  . TONSILLECTOMY    . TOTAL MASTECTOMY Right 05/27/2015   Procedure: RIGHT TOTAL MASTECTOMY;  Surgeon: Fanny Skates, MD;  Location: [facility];  Service: General;  Laterality: Right;    Current  Outpatient Medications  Medication Sig Dispense Refill  . albuterol (PROVENTIL HFA;VENTOLIN HFA) 108 (90 Base) MCG/ACT inhaler Inhale 1-2 puffs into the lungs every 6 (six) hours as needed for wheezing or shortness of breath.     . anastrozole (ARIMIDEX) 1 MG tablet TAKE ONE TABLET BY MOUTH EVERY DAY (Patient taking differently: Take 1 mg by mouth daily. ) 90 tablet 9  . Cholecalciferol (VITAMIN D) 2000 UNITS tablet Take 2,000 Units by mouth daily with lunch.    . Cyanocobalamin (B-12) 2500 MCG TABS Take 2,500 mcg by mouth daily with lunch.     . Fluticasone-Salmeterol (ADVAIR) 250-50 MCG/DOSE AEPB Inhale 1 puff into the lungs every 12 (twelve) hours.     . gabapentin (NEURONTIN) 300 MG capsule Take 600 mg by mouth at bedtime.     Marland Kitchen levothyroxine (SYNTHROID, LEVOTHROID) 200 MCG tablet Take 200 mcg by mouth daily before breakfast.    . Magnesium Oxide 500 MG TABS Take 500 mg by mouth 2 (two)  times daily.     . metFORMIN (GLUCOPHAGE-XR) 500 MG 24 hr tablet Take 500 mg by mouth 2 (two) times daily with a meal.    . montelukast (SINGULAIR) 10 MG tablet Take 10 mg by mouth daily.    Marland Kitchen omega-3 acid ethyl esters (LOVAZA) 1 g capsule Take 1 g by mouth 2 (two) times daily.    . pantoprazole (PROTONIX) 40 MG tablet Take 1 tablet (40 mg total) by mouth daily. 45 tablet 0  . pravastatin (PRAVACHOL) 40 MG tablet Take 20 mg by mouth See admin instructions. Take 0.5 tablet (20 mg) by mouth on Mondays through Thursdays.  0  . PROLIA 60 MG/ML SOSY injection Inject 60 mg into the vein every 6 (six) months.    . rivaroxaban (XARELTO) 20 MG TABS tablet Take 20 mg by mouth daily with supper.    . traMADol-acetaminophen (ULTRACET) 37.5-325 MG tablet Take 1 tablet by mouth every 6 (six) hours as needed (pain).    . triamterene-hydrochlorothiazide (MAXZIDE-25) 37.5-25 MG tablet Take 1 tablet by mouth daily as needed (for fluid retention/swelling.).     Marland Kitchen diltiazem (CARDIZEM CD) 120 MG 24 hr capsule Take 1 capsule (120 mg  total) by mouth daily. 30 capsule 3   No current facility-administered medications for this encounter.     Allergies  Allergen Reactions  . Morphine And Related Anaphylaxis and Other (See Comments)    Pt states she stopped breathing post op- "went into resp. arrest"  . Multaq [Dronedarone] Other (See Comments)    Reaction:  Blood in urine and elevated liver enzymes.   . Demerol Nausea And Vomiting  . Oysters [Shellfish Allergy] Rash    & CLAMS  . Penicillins Itching and Rash    Has patient had a PCN reaction causing immediate rash, facial/tongue/throat swelling, SOB or lightheadedness with hypotension: No Has patient had a PCN reaction causing severe rash involving mucus membranes or skin necrosis: Yes Has patient had a PCN reaction that required hospitalization No Has patient had a PCN reaction occurring within the last 10 years: No If all of the above answers are "NO", then may proceed with Cephalosporin use.   [facility] Reactors Rash    Social History   Socioeconomic History  . Marital status: Married    Spouse name: Not on file  . Number of children: 2  . Years of education: Not on file  . Highest education level: Not on file  Occupational History  . Not on file  Social Needs  . Financial resource strain: Not on file  . Food insecurity:    Worry: Not on file    Inability: Not on file  . Transportation needs:    Medical: Not on file    Non-medical: Not on file  Tobacco Use  . Smoking status: Never Smoker  . Smokeless tobacco: Never Used  Substance and Sexual Activity  . Alcohol use: No    Alcohol/week: 0.0 standard drinks  . Drug use: No  . Sexual activity: Not Currently    Birth control/protection: Surgical  Lifestyle  . Physical activity:    Days per week: Not on file    Minutes per session: Not on file  . Stress: Not on file  Relationships  . Social connections:    Talks on phone: Not on file    Gets together: Not on file    Attends  religious service: Not on file    Active member of club or organization: Not on  file    Attends meetings of clubs or organizations: Not on file    Relationship status: Not on file  . Intimate partner violence:    Fear of current or ex partner: Not on file    Emotionally abused: Not on file    Physically abused: Not on file    Forced sexual activity: Not on file  Other Topics Concern  . Not on file  Social History Narrative   Pt lives in [place] with spouse.   Retired Therapist, sports.  Previously worked in [employer].    Family History  Problem Relation Age of Onset  . Stroke Father 39  . Heart attack Father 13  . Hypertension Father   . Heart disease Mother   . Arthritis Mother   . Breast cancer Mother 11  . Heart failure Mother   . Heart attack Brother   . Hypertension Brother   . Diabetes Sister   . Cancer Sister 49    ROS- All systems are reviewed and negative except as per the HPI above  Physical Exam: Vitals:   05/12/18 1034  BP: 126/76  Pulse: (!) 114  Weight: 64.9 kg  Height: 5' 6.5" (1.689 m)   Wt Readings from Last 3 Encounters:  05/12/18 64.9 kg  04/25/18 65.8 kg  04/14/18 63.5 kg    Labs: Lab Results  Component Value Date   NA 142 03/23/2018   K 4.5 03/23/2018   CL 105 03/23/2018   CO2 28 03/23/2018   GLUCOSE 121 (H) 03/23/2018   BUN 9 03/23/2018   CREATININE 0.70 03/23/2018   CALCIUM 9.5 03/23/2018   PHOS 2.6 03/23/2015   MG 1.5 (L) 04/28/2015   Lab Results  Component Value Date   INR 1.19 12/19/2017   Lab Results  Component Value Date   CHOL  05/05/2010    128        ATP III CLASSIFICATION:  <200     mg/dL   Desirable  200-239  mg/dL   Borderline High  >=240    mg/dL   High          HDL 44 05/05/2010   LDLCALC  05/05/2010    51        Total Cholesterol/HDL:CHD Risk Coronary Heart Disease Risk Table                     Men   Women  1/2 Average Risk   3.4   3.3  Average Risk       5.0   4.4  2 X Average Risk   9.6   7.1  3 X Average  Risk  23.4   11.0        Use the calculated Patient Ratio above and the CHD Risk Table to determine the patient's CHD Risk.        ATP III CLASSIFICATION (LDL):  <100     mg/dL   Optimal  100-129  mg/dL   Near or Above                    Optimal  130-159  mg/dL   Borderline  160-189  mg/dL   High  >190     mg/dL   Very High   TRIG 163 (H) 05/05/2010     GEN- The patient is well appearing, alert and oriented x 3 today.   Head- normocephalic, atraumatic Eyes-  Sclera clear, conjunctiva pink Ears- hearing intact Oropharynx- clear Neck- supple, no  JVP Lymph- no cervical lymphadenopathy Lungs- Clear to ausculation bilaterally, normal work of breathing Heart- raopid regular rate and rhythm, no murmurs, rubs or gallops, PMI not laterally displaced GI- soft, NT, ND, + BS Extremities- no clubbing, cyanosis, or edema MS- no significant deformity or atrophy Skin- no rash or lesion Psych- euthymic mood, full affect Neuro- strength and sensation are intact  EKG-appears to be atrial flutter with RBBB Cardiac CT results reviewed which did show pulmonary htn   Assessment and Plan: 1. S/p afib ablation  More short of breath today and EKG show atrial flutter at 114 bpm Will add diltiazem at 120 mg daily Continue xarelto 20 mg daily  Will see back next week for further evaluation Dr. Rayann Heman in one of his last notes mentioned that if ablation was not successful to control arrhythmia, she may need to be started on [place]. Nuvia Hileman, [facility] [address] [place], [place] [address] [phone]

## 2018-05-17 ENCOUNTER — Encounter (HOSPITAL_COMMUNITY): Payer: Self-pay | Admitting: Nurse Practitioner

## 2018-05-17 ENCOUNTER — Other Ambulatory Visit (HOSPITAL_COMMUNITY): Payer: Self-pay | Admitting: *Deleted

## 2018-05-17 ENCOUNTER — Ambulatory Visit (HOSPITAL_COMMUNITY)
Admission: RE | Admit: 2018-05-17 | Discharge: 2018-05-17 | Disposition: A | Discharge status: Home / Self Care | Payer: Medicare Other | Source: Ambulatory Visit | Attending: Nurse Practitioner | Admitting: Nurse Practitioner

## 2018-05-17 ENCOUNTER — Other Ambulatory Visit: Payer: Self-pay

## 2018-05-17 VITALS — BP 124/72 | HR 111 | Ht 66.5 in | Wt 144.0 lb

## 2018-05-17 DIAGNOSIS — Z8249 Family history of ischemic heart disease and other diseases of the circulatory system: Secondary | ICD-10-CM | POA: Diagnosis not present

## 2018-05-17 DIAGNOSIS — Z803 Family history of malignant neoplasm of breast: Secondary | ICD-10-CM | POA: Insufficient documentation

## 2018-05-17 DIAGNOSIS — Z79899 Other long term (current) drug therapy: Secondary | ICD-10-CM | POA: Insufficient documentation

## 2018-05-17 DIAGNOSIS — Z880 Allergy status to penicillin: Secondary | ICD-10-CM | POA: Insufficient documentation

## 2018-05-17 DIAGNOSIS — E039 Hypothyroidism, unspecified: Secondary | ICD-10-CM | POA: Insufficient documentation

## 2018-05-17 DIAGNOSIS — E7800 Pure hypercholesterolemia, unspecified: Secondary | ICD-10-CM | POA: Insufficient documentation

## 2018-05-17 DIAGNOSIS — Z79890 Hormone replacement therapy (postmenopausal): Secondary | ICD-10-CM | POA: Diagnosis not present

## 2018-05-17 DIAGNOSIS — Z809 Family history of malignant neoplasm, unspecified: Secondary | ICD-10-CM | POA: Insufficient documentation

## 2018-05-17 DIAGNOSIS — N189 Chronic kidney disease, unspecified: Secondary | ICD-10-CM | POA: Insufficient documentation

## 2018-05-17 DIAGNOSIS — Z91013 Allergy to seafood: Secondary | ICD-10-CM | POA: Insufficient documentation

## 2018-05-17 DIAGNOSIS — I484 Atypical atrial flutter: Secondary | ICD-10-CM

## 2018-05-17 DIAGNOSIS — Z888 Allergy status to other drugs, medicaments and biological substances status: Secondary | ICD-10-CM | POA: Insufficient documentation

## 2018-05-17 DIAGNOSIS — I480 Paroxysmal atrial fibrillation: Secondary | ICD-10-CM | POA: Diagnosis not present

## 2018-05-17 DIAGNOSIS — Z8572 Personal history of non-Hodgkin lymphomas: Secondary | ICD-10-CM | POA: Insufficient documentation

## 2018-05-17 DIAGNOSIS — Z85828 Personal history of other malignant neoplasm of skin: Secondary | ICD-10-CM | POA: Insufficient documentation

## 2018-05-17 DIAGNOSIS — Z9049 Acquired absence of other specified parts of digestive tract: Secondary | ICD-10-CM | POA: Diagnosis not present

## 2018-05-17 DIAGNOSIS — Z9011 Acquired absence of right breast and nipple: Secondary | ICD-10-CM | POA: Insufficient documentation

## 2018-05-17 DIAGNOSIS — Z882 Allergy status to sulfonamides status: Secondary | ICD-10-CM | POA: Diagnosis not present

## 2018-05-17 DIAGNOSIS — E1122 Type 2 diabetes mellitus with diabetic chronic kidney disease: Secondary | ICD-10-CM | POA: Diagnosis not present

## 2018-05-17 DIAGNOSIS — Z886 Allergy status to analgesic agent status: Secondary | ICD-10-CM | POA: Insufficient documentation

## 2018-05-17 DIAGNOSIS — Z7984 Long term (current) use of oral hypoglycemic drugs: Secondary | ICD-10-CM | POA: Diagnosis not present

## 2018-05-17 DIAGNOSIS — Z7901 Long term (current) use of anticoagulants: Secondary | ICD-10-CM | POA: Diagnosis not present

## 2018-05-17 DIAGNOSIS — I129 Hypertensive chronic kidney disease with stage 1 through stage 4 chronic kidney disease, or unspecified chronic kidney disease: Secondary | ICD-10-CM | POA: Insufficient documentation

## 2018-05-17 DIAGNOSIS — J45909 Unspecified asthma, uncomplicated: Secondary | ICD-10-CM | POA: Diagnosis not present

## 2018-05-17 DIAGNOSIS — I4819 Other persistent atrial fibrillation: Secondary | ICD-10-CM | POA: Insufficient documentation

## 2018-05-17 DIAGNOSIS — I4892 Unspecified atrial flutter: Secondary | ICD-10-CM | POA: Insufficient documentation

## 2018-05-17 DIAGNOSIS — Z833 Family history of diabetes mellitus: Secondary | ICD-10-CM | POA: Insufficient documentation

## 2018-05-17 DIAGNOSIS — G4730 Sleep apnea, unspecified: Secondary | ICD-10-CM | POA: Insufficient documentation

## 2018-05-17 LAB — CBC
HCT: 36.2 % (ref 36.0–46.0)
Hemoglobin: 11.3 g/dL — ABNORMAL LOW (ref 12.0–15.0)
MCH: 28.2 pg (ref 26.0–34.0)
MCHC: 31.2 g/dL (ref 30.0–36.0)
MCV: 90.3 fL (ref 80.0–100.0)
PLATELETS: 203 10*3/uL (ref 150–400)
RBC: 4.01 MIL/uL (ref 3.87–5.11)
RDW: 14.9 % (ref 11.5–15.5)
WBC: 9.4 10*3/uL (ref 4.0–10.5)
nRBC: 0 % (ref 0.0–0.2)

## 2018-05-17 LAB — BASIC METABOLIC PANEL
Anion gap: 11 (ref 5–15)
BUN: 17 mg/dL (ref 8–23)
CO2: 24 mmol/L (ref 22–32)
Calcium: 9.9 mg/dL (ref 8.9–10.3)
Chloride: 103 mmol/L (ref 98–111)
Creatinine, Ser: 0.89 mg/dL (ref 0.44–1.00)
GFR calc Af Amer: >60 mL/min (ref >60)
Glucose, Bld: 136 mg/dL — ABNORMAL HIGH (ref 70–99)
Potassium: 4.6 mmol/L (ref 3.5–5.1)
Sodium: 138 mmol/L (ref 135–145)

## 2018-05-17 NOTE — Patient Instructions (Signed)
Cardioversion scheduled for  - Arrive at the [facility] and go to admitting at  -Do not eat or drink anything after midnight the night prior to your procedure.  - Take all your morning medication with a sip of water prior to arrival.  - You will not be able to drive home after your procedure.

## 2018-05-17 NOTE — Progress Notes (Signed)
Primary Care Physician: Deland Pretty, MD Referring Physician/EP: Dr. Rayann Heman Cardiologist: Dr. August Saucer Patty [name] is a 78 y.o. female with a h/o  paroxysmal afib that had afib ablation 04/14/18 with Dr. Rayann Heman, (first ablation 2017).. She feels that she has been in SR since the procedure but has had intermittent shortness of breath.  She felt great on Sunday and tired yesterday but short of breath with exertion today. She saw her PCP recently and he obtained an xray  (results requested). The pt states that he did not see pneumonia. She denies any fever/chills, a cough for several weeks but no change in frequency or severity. She has not noted any pedal edema. Weight day of ablation 139.7 lbs, weight today 144.76 lbs. She did take Maxzide for years on a daily  basis. Has not taken any of  this drug for a while. Usually goes by presence of ankle edema to take as needed.   F/u in afib clinic, 3/13, now one month out from ablation procedure. The shortness of breath she had right after the procedure went away for a while, but has noted more shortness of breath for the last several days. Her HR has  also been higher. EKG shows atrial flutter at 114 bpm.   F/u in Afib clinic, 3/18. Pt remains in atrial flutter.Additon of cardizem has not been that helpful to slow v rate or encourage SR.   Today, she denies symptoms of palpitations, chest pain, shortness of breath, orthopnea, PND, lower extremity edema, dizziness, presyncope, syncope, or neurologic sequela. The patient is tolerating medications without difficulties and is otherwise without complaint today.   Past Medical History:  Diagnosis Date   Arthritis    Asthma    Atrial flutter ([place])    PAROXYSMAL, EKG 10/06/2017, ATRIAL FLUTTER (ATYPICAL) WITH VARIABLE VENTRICULAR RESPONCE AND CONTROLLED   Breast cancer, right ([place]) 2/17   R sided, s/p total mastectomy   Bulging lumbar disc    3   Chronic kidney disease    recurring UTI    Complication of anesthesia    "morphine made me stop breathing"   Concentric left ventricular hypertrophy    MILD DECREASE IN GLOBAL WALL MOTION. DOPPLER EVIDENCE OF GRADE II (PSEUDONORMAL) DIASTOLIC DYSFUNCTION, ELEVATED LAP, CALCULATED EF 45%, LEFT ATRIAL CAVITY IS MILDLY DILATED. MILD TO MODERATE AORTIC REGURGITATION. MILD (GRADE I) MITRAL REGURGITATION. MILD TRICUSPID REGURGITATION. ESTIMATED PULMONARY ARTERY SYSTOLIC PRESSURE 22 MMHG.   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)   First degree AV block    HCAP (healthcare-associated pneumonia) 12/2013   Archie Endo 01/18/2014   History of skin cancer    History of transfusion    Hypertension    Hypothyroidism    Left atrial enlargement    Leg swelling    Lymphoma, non-Hodgkin's (HCC)    in remission   Malaise and fatigue    Nocturia    Paroxysmal atrial fibrillation (HCC)    Persistent atrial fibrillation    Pure hypercholesterolemia    Shortness of breath dyspnea    "at times"   Skin cancer    Sleep apnea    uses c-pap   Type II diabetes mellitus ([place])    Past Surgical History:  Procedure Laterality Date   ABDOMINAL HYSTERECTOMY  1972   partial   ABDOMINAL HYSTERECTOMY     APPENDECTOMY  1953   ATRIAL FIBRILLATION ABLATION  08/19/2015   PVI with CTI ablation by Dr Rayann Heman  ATRIAL FIBRILLATION ABLATION N/A 04/14/2018   Procedure: ATRIAL FIBRILLATION ABLATION;  Surgeon: Thompson Grayer, MD;  Location: [facility];  Service: Cardiovascular;  Laterality: N/A;   BACK SURGERY     x2   BIOPSY  02/13/2018   Procedure: BIOPSY;  Surgeon: Ronnette Juniper, MD;  Location: [facility];  Service: Gastroenterology;;  X2=EGD & Colon   BREAST SURGERY     Right mastectomy   CARDIAC CATHETERIZATION  10/10/2008   Nonobstructive CAD   CARDIOVERSION  04/13/2011   Procedure: CARDIOVERSION;  Surgeon: Laverda Page, MD;  Location: [facility];  Service: Cardiovascular;   Laterality: N/A;   CARDIOVERSION N/A 06/18/2015   Procedure: CARDIOVERSION;  Surgeon: Adrian Prows, MD;  Location: [facility];  Service: Cardiovascular;  Laterality: N/A;   CARDIOVERSION N/A 10/26/2016   Procedure: CARDIOVERSION;  Surgeon: Adrian Prows, MD;  Location: [facility];  Service: Cardiovascular;  Laterality: N/A;   CARDIOVERSION N/A 10/11/2017   Procedure: CARDIOVERSION;  Surgeon: Nigel Mormon, MD;  Location: [facility];  Service: Cardiovascular;  Laterality: N/A;   CATARACT EXTRACTION W/ INTRAOCULAR LENS  IMPLANT, BILATERAL Bilateral    CHOLECYSTECTOMY  1993   COLONOSCOPY N/A 02/13/2018   Procedure: COLONOSCOPY;  Surgeon: Ronnette Juniper, MD;  Location: [facility];  Service: Gastroenterology;  Laterality: N/A;   ELECTROPHYSIOLOGIC STUDY N/A 08/19/2015   Procedure: Atrial Fibrillation Ablation;  Surgeon: Thompson Grayer, MD;  Location: [facility];  Service: Cardiovascular;  Laterality: N/A;   ESOPHAGOGASTRODUODENOSCOPY N/A 02/13/2018   Procedure: ESOPHAGOGASTRODUODENOSCOPY (EGD);  Surgeon: Ronnette Juniper, MD;  Location: [facility];  Service: Gastroenterology;  Laterality: N/A;   FOOT SURGERY     IR REMOVAL TUN ACCESS W/ PORT W/O FL MOD SED  04/01/2017   LUMBAR LAMINECTOMY     MASS BIOPSY Left 12/07/2013   Procedure: EXCISIONAL BIOPSY LEFT NECK MASS ;  Surgeon: Jerrell Belfast, MD;  Location: [facility];  Service: ENT;  Laterality: Left;   MASTECTOMY Right 2017   malignant   POLYPECTOMY  02/13/2018   Procedure: POLYPECTOMY;  Surgeon: Ronnette Juniper, MD;  Location: [facility];  Service: Gastroenterology;;  EGD   PORTACATH PLACEMENT  12/2013   Archie Endo 01/04/2014   PUBOVAGINAL SLING     SKIN TAG REMOVAL  2012   leg   TEE WITHOUT CARDIOVERSION N/A 08/19/2015   Procedure: TRANSESOPHAGEAL ECHOCARDIOGRAM (TEE);  Surgeon: Larey Dresser, MD;  Location: [facility];  Service: Cardiovascular;  Laterality: N/A;   TONSILLECTOMY     TOTAL MASTECTOMY Right 05/27/2015    Procedure: RIGHT TOTAL MASTECTOMY;  Surgeon: Fanny Skates, MD;  Location: [facility];  Service: General;  Laterality: Right;    Current Outpatient Medications  Medication Sig Dispense Refill   albuterol (PROVENTIL HFA;VENTOLIN HFA) 108 (90 Base) MCG/ACT inhaler Inhale 1-2 puffs into the lungs every 6 (six) hours as needed for wheezing or shortness of breath.      anastrozole (ARIMIDEX) 1 MG tablet TAKE ONE TABLET BY MOUTH EVERY DAY (Patient taking differently: Take 1 mg by mouth daily. ) 90 tablet 9   Cholecalciferol (VITAMIN D) 2000 UNITS tablet Take 2,000 Units by mouth daily with lunch.     ciprofloxacin (CIPRO) 250 MG tablet Take 250 mg by mouth 2 (two) times daily.     Cyanocobalamin (B-12) 2500 MCG TABS Take 2,500 mcg by mouth daily with lunch.      diltiazem (CARDIZEM CD) 120 MG 24 hr capsule Take 1 capsule (120 mg total) by mouth daily. 30 capsule 3   Fluticasone-Salmeterol (  ADVAIR) 250-50 MCG/DOSE AEPB Inhale 1 puff into the lungs every 12 (twelve) hours.      gabapentin (NEURONTIN) 300 MG capsule Take 600 mg by mouth at bedtime.      levothyroxine (SYNTHROID, LEVOTHROID) 200 MCG tablet Take 200 mcg by mouth daily before breakfast.     Magnesium Oxide 500 MG TABS Take 500 mg by mouth 2 (two) times daily.      metFORMIN (GLUCOPHAGE-XR) 500 MG 24 hr tablet Take 500 mg by mouth 2 (two) times daily with a meal.     montelukast (SINGULAIR) 10 MG tablet Take 10 mg by mouth daily.     omega-3 acid ethyl esters (LOVAZA) 1 g capsule Take 1 g by mouth 2 (two) times daily.     pantoprazole (PROTONIX) 40 MG tablet Take 1 tablet (40 mg total) by mouth daily. 45 tablet 0   pravastatin (PRAVACHOL) 40 MG tablet Take 20 mg by mouth See admin instructions. Take 0.5 tablet (20 mg) by mouth on Mondays through Thursdays.  0   PROLIA 60 MG/ML SOSY injection Inject 60 mg into the vein every 6 (six) months.     rivaroxaban (XARELTO) 20 MG TABS tablet Take 20 mg by mouth daily with supper.      traMADol-acetaminophen (ULTRACET) 37.5-325 MG tablet Take 1 tablet by mouth every 6 (six) hours as needed (pain).     triamterene-hydrochlorothiazide (MAXZIDE-25) 37.5-25 MG tablet Take 1 tablet by mouth daily as needed (for fluid retention/swelling.).      No current facility-administered medications for this encounter.     Allergies  Allergen Reactions   Morphine And Related Anaphylaxis and Other (See Comments)    Pt states she stopped breathing post op- "went into resp. arrest"   Multaq [Dronedarone] Other (See Comments)    Reaction:  Blood in urine and elevated liver enzymes.    Demerol Nausea And Vomiting   Oysters [Shellfish Allergy] Rash    & CLAMS   Penicillins Itching and Rash    Has patient had a PCN reaction causing immediate rash, facial/tongue/throat swelling, SOB or lightheadedness with hypotension: No Has patient had a PCN reaction causing severe rash involving mucus membranes or skin necrosis: Yes Has patient had a PCN reaction that required hospitalization No Has patient had a PCN reaction occurring within the last 10 years: No If all of the above answers are "NO", then may proceed with Cephalosporin use.    Sulfa Drugs Cross Reactors Rash    Social History   Socioeconomic History   Marital status: Married    Spouse name: Not on file   Number of children: 2   Years of education: Not on file   Highest education level: Not on file  Occupational History   Not on file  Social Needs   Financial resource strain: Not on file   Food insecurity:    Worry: Not on file    Inability: Not on file   Transportation needs:    Medical: Not on file    Non-medical: Not on file  Tobacco Use   Smoking status: Never Smoker   Smokeless tobacco: Never Used  Substance and Sexual Activity   Alcohol use: No    Alcohol/week: 0.0 standard drinks   Drug use: No   Sexual activity: Not Currently    Birth control/protection: Surgical  Lifestyle   Physical  activity:    Days per week: Not on file    Minutes per session: Not on file   Stress: Not on  file  Relationships   Social connections:    Talks on phone: Not on file    Gets together: Not on file    Attends religious service: Not on file    Active member of club or organization: Not on file    Attends meetings of clubs or organizations: Not on file    Relationship status: Not on file   Intimate partner violence:    Fear of current or ex partner: Not on file    Emotionally abused: Not on file    Physically abused: Not on file    Forced sexual activity: Not on file  Other Topics Concern   Not on file  Social History Narrative   Pt lives in [place] with spouse.   Retired Therapist, sports.  Previously worked in [employer].    Family History  Problem Relation Age of Onset   Stroke Father 60   Heart attack Father 67   Hypertension Father    Heart disease Mother    Arthritis Mother    Breast cancer Mother 52   Heart failure Mother    Heart attack Brother    Hypertension Brother    Diabetes Sister    Cancer Sister 15    ROS- All systems are reviewed and negative except as per the HPI above  Physical Exam: Vitals:   05/17/18 1131  BP: 124/72  Pulse: (!) 111  Weight: 65.3 kg  Height: 5' 6.5" (1.689 m)   Wt Readings from Last 3 Encounters:  05/17/18 65.3 kg  05/12/18 64.9 kg  04/25/18 65.8 kg    Labs: Lab Results  Component Value Date   NA 138 05/17/2018   K 4.6 05/17/2018   CL 103 05/17/2018   CO2 24 05/17/2018   GLUCOSE 136 (H) 05/17/2018   BUN 17 05/17/2018   CREATININE 0.89 05/17/2018   CALCIUM 9.9 05/17/2018   PHOS 2.6 03/23/2015   MG 1.5 (L) 04/28/2015   Lab Results  Component Value Date   INR 1.19 12/19/2017   Lab Results  Component Value Date   CHOL  05/05/2010    128        ATP III CLASSIFICATION:  <200     mg/dL   Desirable  200-239  mg/dL   Borderline High  >=240    mg/dL   High          HDL 44 05/05/2010   LDLCALC  05/05/2010     51        Total Cholesterol/HDL:CHD Risk Coronary Heart Disease Risk Table                     Men   Women  1/2 Average Risk   3.4   3.3  Average Risk       5.0   4.4  2 X Average Risk   9.6   7.1  3 X Average Risk  23.4   11.0        Use the calculated Patient Ratio above and the CHD Risk Table to determine the patient's CHD Risk.        ATP III CLASSIFICATION (LDL):  <100     mg/dL   Optimal  100-129  mg/dL   Near or Above                    Optimal  130-159  mg/dL   Borderline  160-189  mg/dL   High  >190     mg/dL  Very High   TRIG 163 (H) 05/05/2010     GEN- The patient is well appearing, alert and oriented x 3 today.   Head- normocephalic, atraumatic Eyes-  Sclera clear, conjunctiva pink Ears- hearing intact Oropharynx- clear Neck- supple, no JVP Lymph- no cervical lymphadenopathy Lungs- Clear to ausculation bilaterally, normal work of breathing Heart- fast regular rate and rhythm, no murmurs, rubs or gallops, PMI not laterally displaced GI- soft, NT, ND, + BS Extremities- no clubbing, cyanosis, or edema MS- no significant deformity or atrophy Skin- no rash or lesion Psych- euthymic mood, full affect Neuro- strength and sensation are intact  EKG-appears to be atrial flutter with RBBB Cardiac CT results reviewed which did show pulmonary htn   Assessment and Plan: 1. S/p afib ablation 04/2018 In persistent a flutter for the last week or so Will plan for cardioversion Continue  diltiazem at 120 mg daily Continue xarelto 20 mg daily, no missed doses x 3 weeks If fails cardioversion, Dr. Rayann Heman in one of his last notes mentioned that if ablation was not successful to control arrhythmia, she may need to be started on Tikosyn  F/u with Jeri Lager, NP 3/26 Dr. Rayann Heman 5/22  Geroge Baseman. [place], [facility] [address] [place],  [id number] [phone]

## 2018-05-23 ENCOUNTER — Ambulatory Visit (HOSPITAL_COMMUNITY): Payer: Medicare Other | Admitting: Anesthesiology

## 2018-05-23 ENCOUNTER — Ambulatory Visit (HOSPITAL_COMMUNITY)
Admission: RE | Admit: 2018-05-23 | Discharge: 2018-05-23 | Disposition: A | Discharge status: Home / Self Care | Payer: Medicare Other | Attending: Cardiology | Admitting: Cardiology

## 2018-05-23 ENCOUNTER — Other Ambulatory Visit: Payer: Self-pay

## 2018-05-23 ENCOUNTER — Telehealth: Payer: Medicare Other | Admitting: Cardiology

## 2018-05-23 ENCOUNTER — Encounter (HOSPITAL_COMMUNITY)
Admission: RE | Disposition: A | Discharge status: Home / Self Care | Payer: Self-pay | Source: Home / Self Care | Attending: Cardiology

## 2018-05-23 DIAGNOSIS — Z7901 Long term (current) use of anticoagulants: Secondary | ICD-10-CM | POA: Diagnosis not present

## 2018-05-23 DIAGNOSIS — Z79899 Other long term (current) drug therapy: Secondary | ICD-10-CM | POA: Insufficient documentation

## 2018-05-23 DIAGNOSIS — I4891 Unspecified atrial fibrillation: Secondary | ICD-10-CM | POA: Insufficient documentation

## 2018-05-23 DIAGNOSIS — Z7984 Long term (current) use of oral hypoglycemic drugs: Secondary | ICD-10-CM | POA: Diagnosis not present

## 2018-05-23 DIAGNOSIS — I480 Paroxysmal atrial fibrillation: Secondary | ICD-10-CM | POA: Diagnosis not present

## 2018-05-23 DIAGNOSIS — E119 Type 2 diabetes mellitus without complications: Secondary | ICD-10-CM

## 2018-05-23 DIAGNOSIS — Z7951 Long term (current) use of inhaled steroids: Secondary | ICD-10-CM | POA: Diagnosis not present

## 2018-05-23 DIAGNOSIS — I110 Hypertensive heart disease with heart failure: Secondary | ICD-10-CM | POA: Diagnosis not present

## 2018-05-23 DIAGNOSIS — I5043 Acute on chronic combined systolic (congestive) and diastolic (congestive) heart failure: Secondary | ICD-10-CM | POA: Diagnosis not present

## 2018-05-23 DIAGNOSIS — Z79890 Hormone replacement therapy (postmenopausal): Secondary | ICD-10-CM | POA: Diagnosis not present

## 2018-05-23 DIAGNOSIS — R635 Abnormal weight gain: Secondary | ICD-10-CM

## 2018-05-23 HISTORY — PX: CARDIOVERSION: SHX1299

## 2018-05-23 LAB — GLUCOSE, CAPILLARY: GLUCOSE-CAPILLARY: 106 mg/dL — AB (ref 70–99)

## 2018-05-23 SURGERY — CARDIOVERSION
Anesthesia: General

## 2018-05-23 MED ORDER — PROPOFOL 10 MG/ML IV BOLUS
INTRAVENOUS | Status: DC | PRN
  Administered 2018-05-23: 40 mg via INTRAVENOUS

## 2018-05-23 MED ORDER — SODIUM CHLORIDE 0.9 % IV SOLN
INTRAVENOUS | Status: DC
  Administered 2018-05-23: 13:00:00 via INTRAVENOUS

## 2018-05-23 NOTE — CV Procedure (Signed)
Direct current cardioversion:  Indication symptomatic A. Fibrillation.  Procedure: Using 40 mg of IV Propofol  for achieving deep sedation, synchronized direct current cardioversion performed. Patient was delivered with 100 Joules of electricity X 1 with success to NSR. Patient tolerated the procedure well. No immediate complication noted.   Adrian Prows, MD, [facility] 05/23/2018, 1:47 PM [facility] [place] Pager: [phone] Office: [phone] If no answer Cell [phone]

## 2018-05-23 NOTE — Interval H&P Note (Signed)
History and Physical Interval Note:  05/23/2018 1:41 PM  Patty [name]  has presented today for surgery, with the diagnosis of afib.  The various methods of treatment have been discussed with the patient and family. After consideration of risks, benefits and other options for treatment, the patient has consented to  Procedure(s): CARDIOVERSION (N/A) as a surgical intervention.  The patient's history has been reviewed, patient examined, no change in status, stable for surgery.  I have reviewed the patient's chart and labs.  Questions were answered to the patient's satisfaction.     Adrian Prows

## 2018-05-23 NOTE — Transfer of Care (Signed)
Immediate Anesthesia Transfer of Care Note  Patient: Patty [name]  Procedure(s) Performed: CARDIOVERSION (N/A )  Patient Location: Endoscopy Unit  Anesthesia Type:General  Level of Consciousness: awake, alert , oriented and patient cooperative  Airway & Oxygen Therapy: Patient Spontanous Breathing  Post-op Assessment: Report given to RN and Post -op Vital signs reviewed and stable  Post vital signs: Reviewed and stable  Last Vitals:  Vitals Value Taken Time  BP    Temp    Pulse    Resp    SpO2      Last Pain:  Vitals:   05/23/18 1245  TempSrc: Oral  PainSc: 0-No pain         Complications: No apparent anesthesia complications

## 2018-05-23 NOTE — H&P (Signed)
  Please see my telephone encounter.  Physical Exam  Constitutional: She appears well-developed and well-nourished. No distress.  HENT:  Head: Atraumatic.  Eyes: Conjunctivae are normal.  Neck: Neck supple. No JVD present. No thyromegaly present.  Cardiovascular: Normal rate and intact distal pulses. An irregular rhythm present. Exam reveals no gallop.  No murmur heard. S1 variable, S2 normal  Pulmonary/Chest: Effort normal.  Faint bibaseilar crackles present  Abdominal: Soft. Bowel sounds are normal.  Musculoskeletal: Normal range of motion.        General: No edema.  Neurological: She is alert.  Skin: Skin is warm and dry.  Psychiatric: She has a normal mood and affect.   No current facility-administered medications on file prior to encounter.    Current Outpatient Medications on File Prior to Encounter  Medication Sig Dispense Refill  . albuterol (PROVENTIL HFA;VENTOLIN HFA) 108 (90 Base) MCG/ACT inhaler Inhale 1-2 puffs into the lungs every 6 (six) hours as needed for wheezing or shortness of breath.     . anastrozole (ARIMIDEX) 1 MG tablet TAKE ONE TABLET BY MOUTH EVERY DAY (Patient taking differently: Take 1 mg by mouth daily. ) 90 tablet 9  . cephALEXin (KEFLEX) 250 MG capsule Take 250 mg by mouth 1 day or 1 dose.    . Cholecalciferol (VITAMIN D) 2000 UNITS tablet Take 2,000 Units by mouth daily with lunch.    . ciprofloxacin (CIPRO) 250 MG tablet Take 250 mg by mouth 2 (two) times daily.    . Cyanocobalamin (B-12) 2500 MCG TABS Take 2,500 mcg by mouth daily with lunch.     . diltiazem (CARDIZEM CD) 120 MG 24 hr capsule Take 1 capsule (120 mg total) by mouth daily. 30 capsule 3  . Fluticasone-Salmeterol (ADVAIR) 250-50 MCG/DOSE AEPB Inhale 1 puff into the lungs every 12 (twelve) hours.     . gabapentin (NEURONTIN) 300 MG capsule Take 600 mg by mouth at bedtime.     Marland Kitchen levothyroxine (SYNTHROID, LEVOTHROID) 200 MCG tablet Take 200 mcg by mouth daily before breakfast.    .  Magnesium Oxide 500 MG TABS Take 500 mg by mouth 2 (two) times daily.     . metFORMIN (GLUCOPHAGE-XR) 500 MG 24 hr tablet Take 500 mg by mouth 2 (two) times daily with a meal.    . montelukast (SINGULAIR) 10 MG tablet Take 10 mg by mouth daily.    Marland Kitchen omega-3 acid ethyl esters (LOVAZA) 1 g capsule Take 1 g by mouth 2 (two) times daily.    . pantoprazole (PROTONIX) 40 MG tablet Take 1 tablet (40 mg total) by mouth daily. 45 tablet 0  . pravastatin (PRAVACHOL) 40 MG tablet Take 20 mg by mouth See admin instructions. Take 0.5 tablet (20 mg) by mouth on Mondays through Thursdays.  0  . rivaroxaban (XARELTO) 20 MG TABS tablet Take 20 mg by mouth daily with supper.    . traMADol-acetaminophen (ULTRACET) 37.5-325 MG tablet Take 1 tablet by mouth every 6 (six) hours as needed (pain).    . triamterene-hydrochlorothiazide (MAXZIDE-25) 37.5-25 MG tablet Take 1 tablet by mouth daily as needed (for fluid retention/swelling.).     Marland Kitchen PROLIA 60 MG/ML SOSY injection Inject 60 mg into the vein every 6 (six) months.     Schedule for Direct current cardioversion. I have discussed regarding risks benefits rate control vs rhythm control with the patient. Patient understands cardiac arrest and need for CPR, aspiration pneumonia, but not limited to these. Patient is willing.

## 2018-05-23 NOTE — Telephone Encounter (Signed)
Subjective:  Primary Physician:  Deland Pretty, MD  Patient ID: Patty [name], female    DOB: 1940/05/29, 78 y.o.   MRN: [id number]  Dyspnea on exertion  HPI: Patty [name]  is a 78 y.o. female  with history of paroxysmal atrial flutter/atrial fibrillation, symptomatic with fatigue and dyspnea, has had ablation on 08/19/2015 and 04/14/2018  by Dr. Thompson Grayer. She has also had lymphoma which was treated in 2015 and is in remission, right breast cancer, status post lumpectomy in 2017 followed by chemotherapy therapy. She is presently on long-term anticoagulation with Xarelto. Medical history is also significant for controlled diabetes mellitus, hyperlipidemia, hypertension. Her last recurrent cardioversion was in Aug 2018, 10/11/2017.  She has been scheduled for direct current cardioversion tomorrow atenolol due to symptomatic atypical atrial flutter with RVR and worsening shortness of breath and weight gain related to acute diastolic heart failure.  I called the patient and discussed with her regarding the existing COVID 19 issues to see the appropriateness of urgent direct current cardioversion.  Patient continues to be markedly dyspneic and has continued to gain weight and states that she is unable to walk in the house without having to stop due to dyspnea.  Denies fever, cough.  Continues to have significant fatigue.  Past Medical History:  Diagnosis Date  . Arthritis   . Asthma   . Atrial flutter (HCC)    PAROXYSMAL, EKG 10/06/2017, ATRIAL FLUTTER (ATYPICAL) WITH VARIABLE VENTRICULAR RESPONCE AND CONTROLLED  . Breast cancer, right ([place]) 2/17   R sided, s/p total mastectomy  . Bulging lumbar disc    3  . Chronic kidney disease    recurring UTI  . Complication of anesthesia    "morphine made me stop breathing"  . Concentric left ventricular hypertrophy    MILD DECREASE IN GLOBAL WALL MOTION. DOPPLER EVIDENCE OF GRADE II (PSEUDONORMAL) DIASTOLIC DYSFUNCTION, ELEVATED LAP, CALCULATED  EF 45%, LEFT ATRIAL CAVITY IS MILDLY DILATED. MILD TO MODERATE AORTIC REGURGITATION. MILD (GRADE I) MITRAL REGURGITATION. MILD TRICUSPID REGURGITATION. ESTIMATED PULMONARY ARTERY SYSTOLIC PRESSURE 22 MMHG.  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 First degree AV block   . HCAP (healthcare-associated pneumonia) 12/2013   Archie Endo 01/18/2014  . History of skin cancer   . History of transfusion   . Hypertension   . Hypothyroidism   . Left atrial enlargement   . Leg swelling   . 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 Malaise and fatigue   . Nocturia   . Paroxysmal atrial fibrillation (HCC)   . Persistent atrial fibrillation   . Pure hypercholesterolemia   . Shortness of breath dyspnea    "at times"  . Skin cancer   . Sleep apnea    uses c-pap  . Type II diabetes mellitus ([place])     Past Surgical History:  Procedure Laterality Date  . ABDOMINAL HYSTERECTOMY  1972   partial  . ABDOMINAL HYSTERECTOMY    . APPENDECTOMY  1953  . ATRIAL FIBRILLATION ABLATION  08/19/2015   PVI with CTI ablation by Dr Rayann Heman  . ATRIAL FIBRILLATION ABLATION N/A 04/14/2018   Procedure: ATRIAL FIBRILLATION ABLATION;  Surgeon: Thompson Grayer, MD;  Location: [facility];  Service: Cardiovascular;  Laterality: N/A;  . BACK SURGERY     x2  . BIOPSY  02/13/2018   Procedure: BIOPSY;  Surgeon: Ronnette Juniper, MD;  Location: [facility];  Service: Gastroenterology;;  X2=EGD &  Colon  . BREAST SURGERY     Right mastectomy  . CARDIAC CATHETERIZATION  10/10/2008   Nonobstructive CAD  . CARDIOVERSION  04/13/2011   Procedure: CARDIOVERSION;  Surgeon: Laverda Page, MD;  Location: [facility];  Service: Cardiovascular;  Laterality: N/A;  . CARDIOVERSION N/A 06/18/2015   Procedure: CARDIOVERSION;  Surgeon: Adrian Prows, MD;  Location: [facility];  Service: Cardiovascular;  Laterality: N/A;  . CARDIOVERSION N/A 10/26/2016   Procedure: CARDIOVERSION;   Surgeon: Adrian Prows, MD;  Location: [place];  Service: Cardiovascular;  Laterality: N/A;  . CARDIOVERSION N/A 10/11/2017   Procedure: CARDIOVERSION;  Surgeon: Nigel Mormon, MD;  Location: [facility];  Service: Cardiovascular;  Laterality: N/A;  . CATARACT EXTRACTION W/ INTRAOCULAR LENS  IMPLANT, BILATERAL Bilateral   . CHOLECYSTECTOMY  1993  . COLONOSCOPY N/A 02/13/2018   Procedure: COLONOSCOPY;  Surgeon: Ronnette Juniper, MD;  Location: [facility];  Service: Gastroenterology;  Laterality: N/A;  . ELECTROPHYSIOLOGIC STUDY N/A 08/19/2015   Procedure: Atrial Fibrillation Ablation;  Surgeon: Thompson Grayer, MD;  Location: [facility];  Service: Cardiovascular;  Laterality: N/A;  . ESOPHAGOGASTRODUODENOSCOPY N/A 02/13/2018   Procedure: ESOPHAGOGASTRODUODENOSCOPY (EGD);  Surgeon: Ronnette Juniper, MD;  Location: [facility];  Service: Gastroenterology;  Laterality: N/A;  . FOOT SURGERY    . IR REMOVAL TUN ACCESS W/ PORT W/O FL MOD SED  04/01/2017  . LUMBAR LAMINECTOMY    . MASS BIOPSY Left 12/07/2013   Procedure: EXCISIONAL BIOPSY LEFT NECK MASS ;  Surgeon: Jerrell Belfast, MD;  Location: [facility] [place] [facility];  Service: ENT;  Laterality: Left;  Marland Kitchen MASTECTOMY Right 2017   malignant  . POLYPECTOMY  02/13/2018   Procedure: POLYPECTOMY;  Surgeon: Ronnette Juniper, MD;  Location: [facility];  Service: Gastroenterology;;  EGD  . PORTACATH PLACEMENT  12/2013   Archie Endo 01/04/2014  . PUBOVAGINAL SLING    . SKIN TAG REMOVAL  2012   leg  . TEE WITHOUT CARDIOVERSION N/A 08/19/2015   Procedure: TRANSESOPHAGEAL ECHOCARDIOGRAM (TEE);  Surgeon: Larey [facility]er, MD;  Location: [place];  Service: Cardiovascular;  Laterality: N/A;  . TONSILLECTOMY    . TOTAL MASTECTOMY Right 05/27/2015   Procedure: RIGHT TOTAL MASTECTOMY;  Surgeon: Fanny Skates, MD;  Location: [facility];  Service: General;  Laterality: Right;    Social History   Socioeconomic History  . Marital status: Married    Spouse name: Not on file  . Number  of children: 2  . Years of education: Not on file  . Highest education level: Not on file  Occupational History  . Not on file  Social Needs  . Financial resource strain: Not on file  . Food insecurity:    Worry: Not on file    Inability: Not on file  . Transportation needs:    Medical: Not on file    Non-medical: Not on file  Tobacco Use  . Smoking status: Never Smoker  . Smokeless tobacco: Never Used  Substance and Sexual Activity  . Alcohol use: No    Alcohol/week: 0.0 standard drinks  . Drug use: No  . Sexual activity: Not Currently    Birth control/protection: Surgical  Lifestyle  . Physical activity:    Days per week: Not on file    Minutes per session: Not on file  . Stress: Not on file  Relationships  . Social connections:    Talks on phone: Not on file    Gets together: Not on file    Attends religious service: Not on file  Active member of club or organization: Not on file    Attends meetings of clubs or organizations: Not on file    Relationship status: Not on file  . Intimate partner violence:    Fear of current or ex partner: Not on file    Emotionally abused: Not on file    Physically abused: Not on file    Forced sexual activity: Not on file  Other Topics Concern  . Not on file  Social History Narrative   Pt lives in [place] with spouse.   Retired Therapist, sports.  Previously worked in [employer].    Current Outpatient Medications on File Prior to Visit  Medication Sig Dispense Refill  . albuterol (PROVENTIL HFA;VENTOLIN HFA) 108 (90 Base) MCG/ACT inhaler Inhale 1-2 puffs into the lungs every 6 (six) hours as needed for wheezing or shortness of breath.     . anastrozole (ARIMIDEX) 1 MG tablet TAKE ONE TABLET BY MOUTH EVERY DAY (Patient taking differently: Take 1 mg by mouth daily. ) 90 tablet 9  . Cholecalciferol (VITAMIN D) 2000 UNITS tablet Take 2,000 Units by mouth daily with lunch.    . ciprofloxacin (CIPRO) 250 MG tablet Take 250 mg by mouth 2 (two)  times daily.    . Cyanocobalamin (B-12) 2500 MCG TABS Take 2,500 mcg by mouth daily with lunch.     . diltiazem (CARDIZEM CD) 120 MG 24 hr capsule Take 1 capsule (120 mg total) by mouth daily. 30 capsule 3  . Fluticasone-Salmeterol (ADVAIR) 250-50 MCG/DOSE AEPB Inhale 1 puff into the lungs every 12 (twelve) hours.     . gabapentin (NEURONTIN) 300 MG capsule Take 600 mg by mouth at bedtime.     Marland Kitchen levothyroxine (SYNTHROID, LEVOTHROID) 200 MCG tablet Take 200 mcg by mouth daily before breakfast.    . Magnesium Oxide 500 MG TABS Take 500 mg by mouth 2 (two) times daily.     . metFORMIN (GLUCOPHAGE-XR) 500 MG 24 hr tablet Take 500 mg by mouth 2 (two) times daily with a meal.    . montelukast (SINGULAIR) 10 MG tablet Take 10 mg by mouth daily.    Marland Kitchen omega-3 acid ethyl esters (LOVAZA) 1 g capsule Take 1 g by mouth 2 (two) times daily.    . pantoprazole (PROTONIX) 40 MG tablet Take 1 tablet (40 mg total) by mouth daily. 45 tablet 0  . pravastatin (PRAVACHOL) 40 MG tablet Take 20 mg by mouth See admin instructions. Take 0.5 tablet (20 mg) by mouth on Mondays through Thursdays.  0  . PROLIA 60 MG/ML SOSY injection Inject 60 mg into the vein every 6 (six) months.    . rivaroxaban (XARELTO) 20 MG TABS tablet Take 20 mg by mouth daily with supper.    . traMADol-acetaminophen (ULTRACET) 37.5-325 MG tablet Take 1 tablet by mouth every 6 (six) hours as needed (pain).    . triamterene-hydrochlorothiazide (MAXZIDE-25) 37.5-25 MG tablet Take 1 tablet by mouth daily as needed (for fluid retention/swelling.).      No current facility-administered medications on file prior to visit.       _0 @  Objective:  _1 @ There is no height or weight on file to calculate BMI.  CARDIAC STUDIES:    Heart cath 2010 Mild coronary calcification, Normal EF.  Stress cardiolite 3/12 [facility]) no ischemia . Admitted to [place] in March 2012 for A. Fibrillation with RVR.  Treadmill exercise stress test  10/29/2016: Indication: SoB and Chrontrophic Evaluation Resting EKG demonstrates NSR. The patient exercised  according to  a  Modified Bruce Protocol, Total time recorded  9:20 min achieving max heart rate of  121 which was  85% of THR for age and  5.37 METS of work.  Stress terminated due to  fatigue &THR (>85% MPHR)/MPHR met. Normal BP response. There was no ST-T changes of ischemia with exercise stress test. There were no significant arrhythmias.   Rec: No e/o ischemia by GXT using Modified Bruce protocol.  No Exercise induced arrhythmias. Normal chronotropic response. Exercise tolerence is  low normal.  Continue Preventive therapy.  Echocardiogram 11/09/2017: Left ventricle cavity is normal in size. Moderate concentric hypertrophy of the left ventricle. Mild decrease in global wall motion. Doppler evidence of grade II (pseudonormal) diastolic dysfunction, elevated LAP. Calculated EF 45%. Left atrial cavity is mildly dilated. Mild to moderate aortic regurgitation. Mild (Grade I) mitral regurgitation. Mild tricuspid regurgitation. Estimated pulmonary artery systolic pressure 22  mmHg.   Assessment & Recommendations:   1.  Paroxysmal atrial fibrillation/atypical atrial flutter with rapid ventricular response with history of atrial fibrillation ablation 2 please see history of present illness 2.  Acute on chronic diastolic heart failure 3.  Controlled diabetes mellitus 4.  Hypertension  Recommendation:  I discussed with the telephone regarding her symptoms, she is presently appears to be in acute diastolic heart failure with continued dyspnea, weight gain and fatigue. Diabetes is controlled and blood pressure has been controlled as per patient's statement.  We will go ahead and proceed with direct current cardioversion as planned in view of symptomatic atrial fibrillation.  As per EP recommendation, if she fails to maintain sinus rhythm, she'll be admitted to the hospital electively for  dofetilide initiation. 25 minute of telephone encounter.   Adrian Prows, MD, [facility] 05/23/2018, 5:52 AM [facility]. [place] Pager: [phone] Office: [phone] If no answer Cell [phone]

## 2018-05-23 NOTE — Discharge Instructions (Signed)
Electrical Cardioversion, Care After °This sheet gives you information about how to care for yourself after your procedure. Your health care provider may also give you more specific instructions. If you have problems or questions, contact your health care provider. °What can I expect after the procedure? °After the procedure, it is common to have: °· Some redness on the skin where the shocks were given. °Follow these instructions at home: ° °· Do not drive for 24 hours if you were given a medicine to help you relax (sedative). °· Take over-the-counter and prescription medicines only as told by your health care provider. °· Ask your health care provider how to check your pulse. Check it often. °· Rest for 48 hours after the procedure or as told by your health care provider. °· Avoid or limit your caffeine use as told by your health care provider. °Contact a health care provider if: °· You feel like your heart is beating too quickly or your pulse is not regular. °· You have a serious muscle cramp that does not go away. °Get help right away if: ° °· You have discomfort in your chest. °· You are dizzy or you feel faint. °· You have trouble breathing or you are short of breath. °· Your speech is slurred. °· You have trouble moving an arm or leg on one side of your body. °· Your fingers or toes turn cold or blue. °This information is not intended to replace advice given to you by your health care provider. Make sure you discuss any questions you have with your health care provider. °Document Released: 12/06/2012 Document Revised: 09/19/2015 Document Reviewed: 08/22/2015 °Elsevier Interactive Patient Education © 2019 [employer] ° °

## 2018-05-24 ENCOUNTER — Encounter (HOSPITAL_COMMUNITY): Payer: Self-pay | Admitting: Cardiology

## 2018-05-25 ENCOUNTER — Ambulatory Visit: Payer: Self-pay | Admitting: Cardiology

## 2018-05-25 NOTE — Anesthesia Preprocedure Evaluation (Signed)
Anesthesia Evaluation  Patient identified by MRN, date of birth, ID band Patient awake    Reviewed: Allergy & Precautions, NPO status , Patient's Chart, lab work & pertinent test results  History of Anesthesia Complications (+) history of anesthetic complications  Airway Mallampati: II  TM Distance: >3 FB Neck ROM: Full    Dental  (+) Partial Lower, Dental Advisory Given   Pulmonary shortness of breath and with exertion, asthma , sleep apnea and Continuous Positive Airway Pressure Ventilation , pneumonia, resolved, COPD,  COPD inhaler,    breath sounds clear to auscultation       Cardiovascular hypertension, Pt. on medications +CHF  + dysrhythmias Atrial Fibrillation  Rhythm:Irregular Rate:Normal  RBBB LVEF 45% Mild to mod AI Mild MR and TR   Neuro/Psych Diabetic neuropathy Lumbar radiculopathy  Neuromuscular disease negative psych ROS   GI/Hepatic negative GI ROS, Neg liver ROS,   Endo/Other  diabetes, Well Controlled, Type 2, Oral Hypoglycemic AgentsHypothyroidism Hx/o right breast Ca S/P mastectomy Hypercholesterolemia  Renal/GU Renal disease     Musculoskeletal  (+) Arthritis , Osteoarthritis,    Abdominal   Peds  Hematology  (+) anemia , Hx/o NHL in remission Xarelto therapy-last dose yesterday pm   Anesthesia Other Findings   Reproductive/Obstetrics                             Anesthesia Physical Anesthesia Plan  ASA: III  Anesthesia Plan: General   Post-op Pain Management:    Induction: Intravenous  PONV Risk Score and Plan: 3 and Treatment may vary due to age or medical condition  Airway Management Planned: Mask  Additional Equipment: None  Intra-op Plan:   Post-operative Plan:   Informed Consent: I have reviewed the patients History and Physical, chart, labs and discussed the procedure including the risks, benefits and alternatives for the proposed anesthesia  with the patient or authorized representative who has indicated his/her understanding and acceptance.     Dental advisory given  Plan Discussed with: CRNA and Surgeon  Anesthesia Plan Comments:         Anesthesia Quick Evaluation

## 2018-05-25 NOTE — Anesthesia Postprocedure Evaluation (Signed)
Anesthesia Post Note  Patient: Patty [name]  Procedure(s) Performed: CARDIOVERSION (N/A )     Patient location during evaluation: Endoscopy Anesthesia Type: General Level of consciousness: awake and alert Pain management: pain level controlled Vital Signs Assessment: post-procedure vital signs reviewed and stable Respiratory status: spontaneous breathing, nonlabored ventilation, respiratory function stable and patient connected to nasal cannula oxygen Cardiovascular status: stable Postop Assessment: no apparent nausea or vomiting Anesthetic complications: no    Last Vitals:  Vitals:   05/23/18 1400 05/23/18 1409  BP: 113/61 121/62  Pulse: 79 82  Resp: 18 14  Temp:    SpO2: 99% 99%    Last Pain:  Vitals:   05/23/18 1409  TempSrc:   PainSc: 0-No pain                 Rabia Argote

## 2018-06-02 ENCOUNTER — Encounter: Payer: Self-pay | Admitting: Cardiology

## 2018-06-02 ENCOUNTER — Other Ambulatory Visit: Payer: Self-pay

## 2018-06-02 ENCOUNTER — Ambulatory Visit (INDEPENDENT_AMBULATORY_CARE_PROVIDER_SITE_OTHER): Payer: Medicare Other | Admitting: Cardiology

## 2018-06-02 VITALS — BP 147/79 | HR 74 | Ht 65.0 in | Wt 142.8 lb

## 2018-06-02 DIAGNOSIS — I10 Essential (primary) hypertension: Secondary | ICD-10-CM | POA: Diagnosis not present

## 2018-06-02 DIAGNOSIS — I480 Paroxysmal atrial fibrillation: Secondary | ICD-10-CM

## 2018-06-02 DIAGNOSIS — Z9889 Other specified postprocedural states: Secondary | ICD-10-CM

## 2018-06-02 DIAGNOSIS — I4892 Unspecified atrial flutter: Secondary | ICD-10-CM | POA: Diagnosis not present

## 2018-06-02 DIAGNOSIS — I428 Other cardiomyopathies: Secondary | ICD-10-CM

## 2018-06-02 DIAGNOSIS — Z9289 Personal history of other medical treatment: Secondary | ICD-10-CM | POA: Insufficient documentation

## 2018-06-02 HISTORY — DX: Unspecified atrial flutter: I48.92

## 2018-06-02 HISTORY — DX: Personal history of other medical treatment: Z92.89

## 2018-06-02 HISTORY — DX: Other specified postprocedural states: Z98.890

## 2018-06-02 HISTORY — DX: Other cardiomyopathies: I42.8

## 2018-06-02 NOTE — Progress Notes (Signed)
Subjective:  Primary Physician:  Deland Pretty, MD  Patient ID: Patty [name], female    DOB: [birth year], 78 y.o.   MRN: [id number]  Chief Complaint  Patient presents with   Atrial Fibrillation   Hypertension   Follow-up    HPI: Patty [name]  is a 78 y.o. female  with  paroxysmal atrial flutter/atrial fibrillation, symptomatic with fatigue and dyspnea, has had ablation on 08/19/2015 by Dr. Thompson Grayer. She has also had lymphoma which was treated in 2015 and is in remission, right breast cancer, status post lumpectomy in 2017 followed by chemotherapy therapy. She is presently on long-term anticoagulation. Medical history is also significant for controlled diabetes mellitus, hyperlipidemia, hypertension.  She underwent repeat atrial fibrillation ablation on 04/14/2018 due to symptomatic A. fib and difficulty in controlling her heart rate.  It was planned that if cardioversion does not help, she may be a good candidate for Tikosyn.  She underwent direct-current cardioversion on 05/23/2018, she now presents for follow-up.  Past Medical History:  Diagnosis Date   Acute on chronic combined systolic and diastolic CHF (congestive heart failure) (HCC)    Arthritis    Asthma    Atrial flutter ([place])    PAROXYSMAL, EKG 10/06/2017, ATRIAL FLUTTER (ATYPICAL) WITH VARIABLE VENTRICULAR RESPONCE AND CONTROLLED   Atrial flutter, paroxysmal ([place]) 06/02/2018   Breast cancer, right ([place]) 2/17   R sided, s/p total mastectomy   Bulging lumbar disc    3   Chronic kidney disease    recurring UTI   Complication of anesthesia    "morphine made me stop breathing"   Concentric left ventricular hypertrophy    MILD DECREASE IN GLOBAL WALL MOTION. DOPPLER EVIDENCE OF GRADE II (PSEUDONORMAL) DIASTOLIC DYSFUNCTION, ELEVATED LAP, CALCULATED EF 45%, LEFT ATRIAL CAVITY IS MILDLY DILATED. MILD TO MODERATE AORTIC REGURGITATION. MILD (GRADE I) MITRAL REGURGITATION. MILD TRICUSPID REGURGITATION.  ESTIMATED PULMONARY ARTERY SYSTOLIC PRESSURE 22 MMHG.   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)   First degree AV block    HCAP (healthcare-associated pneumonia) 12/2013   Archie Endo 01/18/2014   History of cardioversion 06/02/2018   History of skin cancer    History of transfusion    Hypertension    Hypothyroidism    Left atrial enlargement    Leg swelling    Lymphoma, non-Hodgkin's (HCC)    in remission   Malaise and fatigue    Nocturia    Nonischemic cardiomyopathy ([place]) 06/02/2018   Paroxysmal atrial fibrillation (HCC)    Persistent atrial fibrillation    Pure hypercholesterolemia    Shortness of breath dyspnea    "at times"   Skin cancer    Sleep apnea    uses c-pap   Type II diabetes mellitus ([place])     Past Surgical History:  Procedure Laterality Date   ABDOMINAL HYSTERECTOMY  1972   partial   ABDOMINAL HYSTERECTOMY     APPENDECTOMY  1953   ATRIAL FIBRILLATION ABLATION  08/19/2015   PVI with CTI ablation by Dr Rayann Heman   ATRIAL FIBRILLATION ABLATION N/A 04/14/2018   Procedure: ATRIAL FIBRILLATION ABLATION;  Surgeon: Thompson Grayer, MD;  Location: [facility];  Service: Cardiovascular;  Laterality: N/A;   BACK SURGERY     x2   BIOPSY  02/13/2018   Procedure: BIOPSY;  Surgeon: Ronnette Juniper, MD;  Location: [facility];  Service: Gastroenterology;;  X2=EGD & Colon   BREAST SURGERY     Right mastectomy  CARDIAC CATHETERIZATION  10/10/2008   Nonobstructive CAD   CARDIOVERSION  04/13/2011   Procedure: CARDIOVERSION;  Surgeon: Laverda Page, MD;  Location: [facility];  Service: Cardiovascular;  Laterality: N/A;   CARDIOVERSION N/A 06/18/2015   Procedure: CARDIOVERSION;  Surgeon: Adrian Prows, MD;  Location: [facility];  Service: Cardiovascular;  Laterality: N/A;   CARDIOVERSION N/A 10/26/2016   Procedure: CARDIOVERSION;  Surgeon: Adrian Prows, MD;  Location: [facility];  Service: Cardiovascular;   Laterality: N/A;   CARDIOVERSION N/A 10/11/2017   Procedure: CARDIOVERSION;  Surgeon: Nigel Mormon, MD;  Location: [facility];  Service: Cardiovascular;  Laterality: N/A;   CARDIOVERSION N/A 05/23/2018   Procedure: CARDIOVERSION;  Surgeon: Adrian Prows, MD;  Location: [facility];  Service: Cardiovascular;  Laterality: N/A;   CATARACT EXTRACTION W/ INTRAOCULAR LENS  IMPLANT, BILATERAL Bilateral    CHOLECYSTECTOMY  1993   COLONOSCOPY N/A 02/13/2018   Procedure: COLONOSCOPY;  Surgeon: Ronnette Juniper, MD;  [facility]: [facility];  Service: Gastroenterology;  Laterality: N/A;   ELECTROPHYSIOLOGIC STUDY N/A 08/19/2015   Procedure: Atrial Fibrillation Ablation;  Surgeon: Thompson Grayer, MD;  Location: [facility];  Service: Cardiovascular;  Laterality: N/A;   ESOPHAGOGASTRODUODENOSCOPY N/A 02/13/2018   Procedure: ESOPHAGOGASTRODUODENOSCOPY (EGD);  Surgeon: Ronnette Juniper, MD;  Location: [facility];  Service: Gastroenterology;  Laterality: N/A;   FOOT SURGERY     IR REMOVAL TUN ACCESS W/ PORT W/O FL MOD SED  04/01/2017   LUMBAR LAMINECTOMY     MASS BIOPSY Left 12/07/2013   Procedure: EXCISIONAL BIOPSY LEFT NECK MASS ;  Surgeon: Jerrell Belfast, MD;  [facility];  Service: ENT;  Laterality: Left;   MASTECTOMY Right 2017   malignant   POLYPECTOMY  02/13/2018   Procedure: POLYPECTOMY;  Surgeon: Ronnette Juniper, MD;  [facility];  Service: Gastroenterology;;  EGD   PORTACATH PLACEMENT  12/2013   Archie Endo 01/04/2014   PUBOVAGINAL SLING     SKIN TAG REMOVAL  2012   leg   TEE WITHOUT CARDIOVERSION N/A 08/19/2015   Procedure: TRANSESOPHAGEAL ECHOCARDIOGRAM (TEE);  Surgeon: Larey Dresser, MD;  Location: [facility];  Service: Cardiovascular;  Laterality: N/A;   TONSILLECTOMY     TOTAL MASTECTOMY Right 05/27/2015   Procedure: RIGHT TOTAL MASTECTOMY;  Surgeon: Fanny Skates, MD;  Location: [facility];  Service: General;  Laterality: Right;    Social History    Socioeconomic History   Marital status: Married    Spouse name: Not on file   Number of children: 2   Years of education: Not on file   Highest education level: Not on file  Occupational History   Not on file  Social Needs   Financial resource strain: Not on file   Food insecurity:    Worry: Not on file    Inability: Not on file   Transportation needs:    Medical: Not on file    Non-medical: Not on file  Tobacco Use   Smoking status: Never Smoker   Smokeless tobacco: Never Used  Substance and Sexual Activity   Alcohol use: No    Alcohol/week: 0.0 standard drinks   Drug use: No   Sexual activity: Not Currently    Birth control/protection: Surgical  Lifestyle   Physical activity:    Days per week: Not on file    Minutes per session: Not on file   Stress: Not on file  Relationships   Social connections:    Talks on phone: Not on file    Gets together: Not  on file    Attends religious service: Not on file    Active member of club or organization: Not on file    Attends meetings of clubs or organizations: Not on file    Relationship status: Not on file   Intimate partner violence:    Fear of current or ex partner: Not on file    Emotionally abused: Not on file    Physically abused: Not on file    Forced sexual activity: Not on file  Other Topics Concern   Not on file  Social History Narrative   Pt lives in [place] with spouse.   Retired Therapist, sports.  Previously worked in [employer].    Current Outpatient Medications on File Prior to Visit  Medication Sig Dispense Refill   albuterol (PROVENTIL HFA;VENTOLIN HFA) 108 (90 Base) MCG/ACT inhaler Inhale 1-2 puffs into the lungs every 6 (six) hours as needed for wheezing or shortness of breath.      anastrozole (ARIMIDEX) 1 MG tablet TAKE ONE TABLET BY MOUTH EVERY DAY (Patient taking differently: Take 1 mg by mouth daily. ) 90 tablet 9   cephALEXin (KEFLEX) 250 MG capsule Take 250 mg by mouth 1 day or 1  dose.     Cholecalciferol (VITAMIN D) 2000 UNITS tablet Take 2,000 Units by mouth daily with lunch.     Cyanocobalamin (B-12) 2500 MCG TABS Take 2,500 mcg by mouth daily with lunch.      diltiazem (CARDIZEM CD) 120 MG 24 hr capsule Take 1 capsule (120 mg total) by mouth daily. 30 capsule 3   Fluticasone-Salmeterol (ADVAIR) 250-50 MCG/DOSE AEPB Inhale 1 puff into the lungs every 12 (twelve) hours.      gabapentin (NEURONTIN) 300 MG capsule Take 600 mg by mouth at bedtime.      levothyroxine (SYNTHROID, LEVOTHROID) 200 MCG tablet Take 200 mcg by mouth daily before breakfast.     Magnesium Oxide 500 MG TABS Take 500 mg by mouth 2 (two) times daily.      metFORMIN (GLUCOPHAGE-XR) 500 MG 24 hr tablet Take 500 mg by mouth 2 (two) times daily with a meal.     montelukast (SINGULAIR) 10 MG tablet Take 10 mg by mouth daily.     omega-3 acid ethyl esters (LOVAZA) 1 g capsule Take 1 g by mouth 2 (two) times daily.     pravastatin (PRAVACHOL) 40 MG tablet Take 20 mg by mouth See admin instructions. Take 0.5 tablet (20 mg) by mouth on Mondays through Thursdays.  0   PROLIA 60 MG/ML SOSY injection Inject 60 mg into the vein every 6 (six) months.     rivaroxaban (XARELTO) 20 MG TABS tablet Take 20 mg by mouth daily with supper.     traMADol-acetaminophen (ULTRACET) 37.5-325 MG tablet Take 1 tablet by mouth every 6 (six) hours as needed (pain).     triamterene-hydrochlorothiazide (MAXZIDE-25) 37.5-25 MG tablet Take 1 tablet by mouth daily as needed (for fluid retention/swelling.).      pantoprazole (PROTONIX) 40 MG tablet Take 1 tablet (40 mg total) by mouth daily. 45 tablet 0   No current facility-administered medications on file prior to visit.     Review of Systems  Constitution: Negative for chills, decreased appetite, malaise/fatigue and weight gain.  Cardiovascular: Negative for dyspnea on exertion, leg swelling and syncope.  Endocrine: Negative for cold intolerance.   Hematologic/Lymphatic: Does not bruise/bleed easily.  Musculoskeletal: Positive for arthritis. Negative for joint swelling.  Gastrointestinal: Negative for abdominal pain, anorexia and change in  bowel habit.  Neurological: Negative for headaches and light-headedness.  Psychiatric/Behavioral: Negative for depression and substance abuse.  All other systems reviewed and are negative.     Objective:  Blood pressure (!) 147/79, pulse 74, height 5\' 5"  (1.651 m), weight 142 lb 12.8 oz (64.8 kg), SpO2 97 %. Body mass index is 23.76 kg/m.  Physical Exam  Constitutional: No distress.  Moderately built and petite  HENT:  Head: Atraumatic.  Eyes: Conjunctivae are normal.  Neck: Neck supple. No JVD present. No thyromegaly present.  Cardiovascular: Normal rate, regular rhythm, S1 normal, S2 normal and intact distal pulses. Exam reveals no gallop.  No murmur heard. II/VI SEM in the right parasternal border  Pulmonary/Chest: Effort normal and breath sounds normal.  Abdominal: Soft. Bowel sounds are normal.  Musculoskeletal: Normal range of motion.        General: No edema.  Neurological: She is alert.  Skin: Skin is warm and dry.  Psychiatric: She has a normal mood and affect.   Radiology: No results found.  Laboratory Examination:  CMP Latest Ref Rng & Units 05/17/2018 03/23/2018 03/15/2018  Glucose 70 - 99 mg/dL 136(H) 121(H) 115(H)  BUN 8 - 23 mg/dL 17 9 9   Creatinine 0.44 - 1.00 mg/dL 0.89 0.70 0.58  Sodium 135 - 145 mmol/L 138 142 141  Potassium 3.5 - 5.1 mmol/L 4.6 4.5 4.4  Chloride 98 - 111 mmol/L 103 105 105  CO2 22 - 32 mmol/L 24 28 21   Calcium 8.9 - 10.3 mg/dL 9.9 9.5 9.3  Total Protein 6.5 - 8.1 g/dL - 6.5 -  Total Bilirubin 0.3 - 1.2 mg/dL - 0.5 -  Alkaline Phos 38 - 126 U/L - 77 -  AST 15 - 41 U/L - 16 -  ALT 0 - 44 U/L - 13 -   CBC Latest Ref Rng & Units 05/17/2018 03/23/2018 03/15/2018  WBC 4.0 - 10.5 K/uL 9.4 6.7 7.9  Hemoglobin 12.0 - 15.0 g/dL 11.3(L) 10.9(L) 10.8(L)   Hematocrit 36.0 - 46.0 % 36.2 34.9(L) 32.1(L)  Platelets 150 - 400 K/uL 203 209 206   Lipid Panel     Component Value Date/Time   CHOL  05/05/2010 0200    128        ATP III CLASSIFICATION:  <200     mg/dL   Desirable  200-239  mg/dL   Borderline High  >=240    mg/dL   High          TRIG 163 (H) 05/05/2010 0200   HDL 44 05/05/2010 0200   CHOLHDL 2.9 05/05/2010 0200   VLDL 33 05/05/2010 0200   LDLCALC  05/05/2010 0200    51        Total Cholesterol/HDL:CHD Risk Coronary Heart Disease Risk Table                     Men   Women  1/2 Average Risk   3.4   3.3  Average Risk       5.0   4.4  2 X Average Risk   9.6   7.1  3 X Average Risk  23.4   11.0        Use the calculated Patient Ratio above and the CHD Risk Table to determine the patient's CHD Risk.        ATP III CLASSIFICATION (LDL):  <100     mg/dL   Optimal  100-129  mg/dL   Near or Above  Optimal  130-159  mg/dL   Borderline  160-189  mg/dL   High  >190     mg/dL   Very High   HEMOGLOBIN A1C Lab Results  Component Value Date   HGBA1C 5.9 (H) 05/23/2015   MPG 123 05/23/2015   TSH No results for input(s): TSH in the last 8760 hours.  CARDIAC STUDIES:   Coronary angiogram 2010 (no stents-Dr. Peter Martinique)  Mild coronary calcification, Normal EF. Admitted to [facility] in March 2012 for A. Fibrillation with RVR. Stress cardiolite 3/12 [facility]) no ischemia .    Echocardiogram 11/09/2017: Left ventricle cavity is normal in size. Moderate concentric hypertrophy of the left ventricle. Mild decrease in global wall motion. Doppler evidence of grade II (pseudonormal) diastolic dysfunction, elevated LAP. Calculated EF 45%. Left atrial cavity is mildly dilated. Mild to moderate aortic regurgitation. Mild (Grade I) mitral regurgitation. Mild tricuspid regurgitation. Estimated pulmonary artery systolic pressure 22  mmHg.  Assessment:    Paroxysmal atrial fibrillation (HCC)  Atrial  flutter, paroxysmal (HCC) - Plan: EKG 12-Lead  Nonischemic cardiomyopathy ([place])  Essential hypertension  CHA2DS2-VASc Score is 5.  -(CHF; HTN; vasc disease DM,  Female = 1; Age <65 =0; 65-74 = 1,  >75 =2; stroke = 2).  -(Yearly risk of stroke: Score of 1=1.3; 2=2.2; 3=3.2; 4=4; 5=6.7; 6=9.8; 7=>9.8) (Unable to tolerate Multaq due to abnorma LFT. Flecainide caused hight degree AV Block.  Tikosyn if recurrent A. FIb)  EKG 06/02/2018: Baseline artifact noted.  Sinus rhythm with first-degree AV block at rate of 75 bpm, PR interval 310 ms.  Left axis deviation, left anterior fascicular block.  Poor R wave progression, cannot exclude anteroseptal infarct old.  Nonspecific T abnormality. No significant change from  EKG 11/05/2016.  Recommendation: Patient with paroxysmal atrial flutter and atrial fibrillation and has had ablation 2, latest ablation in 04/14/2018, but no back in atrial fibrillation with RVR, underwent successful direct current cardioversion on 05/23/2018 and presents for follow-up.  Has noticed marked improvement in dyspnea and fatigue.  No clinical evidence of congestive heart failure although the LVEF is reduced at 45% by prior echocardiogram.  Blood pressure is well controlled, she is tolerating anticoagulation without bleeding complications.  No changes in the medications were done today.  I'll like to see her back in 3 months for follow-up.  Adrian Prows, MD, [facility] 06/02/2018, 2:08 PM [facility] [place] Pager: [phone] Office: [phone] If no answer Cell [phone]

## 2018-06-20 ENCOUNTER — Telehealth: Payer: Self-pay

## 2018-06-20 DIAGNOSIS — M810 Age-related osteoporosis without current pathological fracture: Secondary | ICD-10-CM | POA: Diagnosis not present

## 2018-06-20 DIAGNOSIS — M170 Bilateral primary osteoarthritis of knee: Secondary | ICD-10-CM | POA: Diagnosis not present

## 2018-06-20 DIAGNOSIS — M1180 Other specified crystal arthropathies, unspecified site: Secondary | ICD-10-CM | POA: Diagnosis not present

## 2018-06-20 DIAGNOSIS — M25512 Pain in left shoulder: Secondary | ICD-10-CM | POA: Diagnosis not present

## 2018-06-20 DIAGNOSIS — M7582 Other shoulder lesions, left shoulder: Secondary | ICD-10-CM | POA: Diagnosis not present

## 2018-06-20 DIAGNOSIS — R748 Abnormal levels of other serum enzymes: Secondary | ICD-10-CM | POA: Diagnosis not present

## 2018-06-20 NOTE — Telephone Encounter (Signed)
Pt called and wanted to know if she needs to f/u with [facility]. She also needs refill on Diltiazem, which was initially prescribed by [facility] clinic.  Can you fill this at [email or web address] 

## 2018-06-20 NOTE — Telephone Encounter (Signed)
You can refill dilt. She does not need to see EP clinic

## 2018-06-22 ENCOUNTER — Other Ambulatory Visit: Payer: Self-pay

## 2018-06-22 MED ORDER — DILTIAZEM HCL ER COATED BEADS 120 MG PO CP24: 120.0000 mg | ORAL_CAPSULE | Freq: Every day | ORAL | 1 refills | Status: DC

## 2018-06-22 NOTE — Telephone Encounter (Signed)
S/w pt advised her to keep appt w/ Dr. Rayann Heman and sent her Diltiazem to her pharmacy.

## 2018-06-28 ENCOUNTER — Ambulatory Visit (INDEPENDENT_AMBULATORY_CARE_PROVIDER_SITE_OTHER): Payer: Medicare Other | Admitting: Podiatry

## 2018-06-28 ENCOUNTER — Encounter: Payer: Self-pay | Admitting: Podiatry

## 2018-06-28 ENCOUNTER — Other Ambulatory Visit: Payer: Self-pay

## 2018-06-28 VITALS — Temp 97.7°F

## 2018-06-28 DIAGNOSIS — E1142 Type 2 diabetes mellitus with diabetic polyneuropathy: Secondary | ICD-10-CM

## 2018-06-28 DIAGNOSIS — M79675 Pain in left toe(s): Secondary | ICD-10-CM | POA: Diagnosis not present

## 2018-06-28 DIAGNOSIS — B351 Tinea unguium: Secondary | ICD-10-CM | POA: Diagnosis not present

## 2018-06-28 DIAGNOSIS — M79674 Pain in right toe(s): Secondary | ICD-10-CM | POA: Diagnosis not present

## 2018-06-28 NOTE — Patient Instructions (Signed)
Diabetes Mellitus and Foot Care Foot care is an important part of your health, especially when you have diabetes. Diabetes may cause you to have problems because of poor blood flow (circulation) to your feet and legs, which can cause your skin to:  Become thinner and drier.  Break more easily.  Heal more slowly.  Peel and crack. You may also have nerve damage (neuropathy) in your legs and feet, causing decreased feeling in them. This means that you may not notice minor injuries to your feet that could lead to more serious problems. Noticing and addressing any potential problems early is the best way to prevent future foot problems. How to care for your feet Foot hygiene  Wash your feet daily with warm water and mild soap. Do not use hot water. Then, pat your feet and the areas between your toes until they are completely dry. Do not soak your feet as this can dry your skin.  Trim your toenails straight across. Do not dig under them or around the cuticle. File the edges of your nails with an emery board or nail file.  Apply a moisturizing lotion or petroleum jelly to the skin on your feet and to dry, brittle toenails. Use lotion that does not contain alcohol and is unscented. Do not apply lotion between your toes. Shoes and socks  Wear clean socks or stockings every day. Make sure they are not too tight. Do not wear knee-high stockings since they may decrease blood flow to your legs.  Wear shoes that fit properly and have enough cushioning. Always look in your shoes before you put them on to be sure there are no objects inside.  To break in new shoes, wear them for just a few hours a day. This prevents injuries on your feet. Wounds, scrapes, corns, and calluses  Check your feet daily for blisters, cuts, bruises, sores, and redness. If you cannot see the bottom of your feet, use a mirror or ask someone for help.  Do not cut corns or calluses or try to remove them with medicine.  If you  find a minor scrape, cut, or break in the skin on your feet, keep it and the skin around it clean and dry. You may clean these areas with mild soap and water. Do not clean the area with peroxide, alcohol, or iodine.  If you have a wound, scrape, corn, or callus on your foot, look at it several times a day to make sure it is healing and not infected. Check for: ? Redness, swelling, or pain. ? Fluid or blood. ? Warmth. ? Pus or a bad smell. General instructions  Do not cross your legs. This may decrease blood flow to your feet.  Do not use heating pads or hot water bottles on your feet. They may burn your skin. If you have lost feeling in your feet or legs, you may not know this is happening until it is too late.  Protect your feet from hot and cold by wearing shoes, such as at the beach or on hot pavement.  Schedule a complete foot exam at least once a year (annually) or more often if you have foot problems. If you have foot problems, report any cuts, sores, or bruises to your health care provider immediately. Contact a health care provider if:  You have a medical condition that increases your risk of infection and you have any cuts, sores, or bruises on your feet.  You have an injury that is not   healing.  You have redness on your legs or feet.  You feel burning or tingling in your legs or feet.  You have pain or cramps in your legs and feet.  Your legs or feet are numb.  Your feet always feel cold.  You have pain around a toenail. Get help right away if:  You have a wound, scrape, corn, or callus on your foot and: ? You have pain, swelling, or redness that gets worse. ? You have fluid or blood coming from the wound, scrape, corn, or callus. ? Your wound, scrape, corn, or callus feels warm to the touch. ? You have pus or a bad smell coming from the wound, scrape, corn, or callus. ? You have a fever. ? You have a red line going up your leg. Summary  Check your feet every day  for cuts, sores, red spots, swelling, and blisters.  Moisturize feet and legs daily.  Wear shoes that fit properly and have enough cushioning.  If you have foot problems, report any cuts, sores, or bruises to your health care provider immediately.  Schedule a complete foot exam at least once a year (annually) or more often if you have foot problems. This information is not intended to replace advice given to you by your health care provider. Make sure you discuss any questions you have with your health care provider. Document Released: 02/13/2000 Document Revised: 03/30/2017 Document Reviewed: 03/19/2016 Elsevier Interactive Patient Education  2019 [employer]  Onychomycosis/Fungal Toenails  WHAT IS IT? An infection that lies within the keratin of your nail plate that is caused by a fungus.  WHY ME? Fungal infections affect all ages, sexes, races, and creeds.  There may be many factors that predispose you to a fungal infection such as age, coexisting medical conditions such as diabetes, or an autoimmune disease; stress, medications, fatigue, genetics, etc.  Bottom line: fungus thrives in a warm, moist environment and your shoes offer such a location.  IS IT CONTAGIOUS? Theoretically, yes.  You do not want to share shoes, nail clippers or files with someone who has fungal toenails.  Walking around barefoot in the same room or sleeping in the same bed is unlikely to transfer the organism.  It is important to realize, however, that fungus can spread easily from one nail to the next on the same foot.  HOW DO WE TREAT THIS?  There are several ways to treat this condition.  Treatment may depend on many factors such as age, medications, pregnancy, liver and kidney conditions, etc.  It is best to ask your doctor which options are available to you.  1. No treatment.   Unlike many other medical concerns, you can live with this condition.  However for many people this can be a painful condition and  may lead to ingrown toenails or a bacterial infection.  It is recommended that you keep the nails cut short to help reduce the amount of fungal nail. 2. Topical treatment.  These range from herbal remedies to prescription strength nail lacquers.  About 40-50% effective, topicals require twice daily application for approximately 9 to 12 months or until an entirely new nail has grown out.  The most effective topicals are medical grade medications available through physicians offices. 3. Oral antifungal medications.  With an 80-90% cure rate, the most common oral medication requires 3 to 4 months of therapy and stays in your system for a year as the new nail grows out.  Oral antifungal medications do require   blood work to make sure it is a safe drug for you.  A liver function panel will be performed prior to starting the medication and after the first month of treatment.  It is important to have the blood work performed to avoid any harmful side effects.  In general, this medication safe but blood work is required. 4. Laser Therapy.  This treatment is performed by applying a specialized laser to the affected nail plate.  This therapy is noninvasive, fast, and non-painful.  It is not covered by insurance and is therefore, out of pocket.  The results have been very good with a 80-95% cure rate.  The [facility] is the only practice in the area to offer this therapy. 5. Permanent Nail Avulsion.  Removing the entire nail so that a new nail will not grow back. 

## 2018-07-03 ENCOUNTER — Encounter: Payer: Self-pay | Admitting: Podiatry

## 2018-07-03 NOTE — Progress Notes (Signed)
Subjective: Patty [name] presents today with history of diabetic neuropathy with cc of painful, mycotic toenails.  Pain is aggravated when wearing enclosed shoe gear and relieved with periodic professional debridement.  Patient has diabetic neuropathy managed with gabapentin.  Deland Pretty, MD is her PCP.  Last visit April 12, 2018.   Current Outpatient Medications:  .  albuterol (PROVENTIL HFA;VENTOLIN HFA) 108 (90 Base) MCG/ACT inhaler, Inhale 1-2 puffs into the lungs every 6 (six) hours as needed for wheezing or shortness of breath. , Disp: , Rfl:  .  anastrozole (ARIMIDEX) 1 MG tablet, TAKE ONE TABLET BY MOUTH EVERY DAY (Patient taking differently: Take 1 mg by mouth daily. ), Disp: 90 tablet, Rfl: 9 .  cephALEXin (KEFLEX) 250 MG capsule, Take 250 mg by mouth 1 day or 1 dose., Disp: , Rfl:  .  Cholecalciferol (VITAMIN D) 2000 UNITS tablet, Take 2,000 Units by mouth daily with lunch., Disp: , Rfl:  .  Cyanocobalamin (B-12) 2500 MCG TABS, Take 2,500 mcg by mouth daily with lunch. , Disp: , Rfl:  .  diltiazem (CARDIZEM CD) 120 MG 24 hr capsule, Take 1 capsule (120 mg total) by mouth daily., Disp: 90 capsule, Rfl: 1 .  Fluticasone-Salmeterol (ADVAIR) 250-50 MCG/DOSE AEPB, Inhale 1 puff into the lungs every 12 (twelve) hours. , Disp: , Rfl:  .  gabapentin (NEURONTIN) 300 MG capsule, Take 600 mg by mouth at bedtime. , Disp: , Rfl:  .  levothyroxine (SYNTHROID, LEVOTHROID) 200 MCG tablet, Take 200 mcg by mouth daily before breakfast., Disp: , Rfl:  .  Magnesium Oxide 500 MG TABS, Take 500 mg by mouth 2 (two) times daily. , Disp: , Rfl:  .  metFORMIN (GLUCOPHAGE-XR) 500 MG 24 hr tablet, Take 500 mg by mouth 2 (two) times daily with a meal., Disp: , Rfl:  .  montelukast (SINGULAIR) 10 MG tablet, Take 10 mg by mouth daily., Disp: , Rfl:  .  omega-3 acid ethyl esters (LOVAZA) 1 g capsule, Take 1 g by mouth 2 (two) times daily., Disp: , Rfl:  .  pravastatin (PRAVACHOL) 40 MG tablet, Take 20 mg  by mouth See admin instructions. Take 0.5 tablet (20 mg) by mouth on Mondays through Thursdays., Disp: , Rfl: 0 .  PROLIA 60 MG/ML SOSY injection, Inject 60 mg into the vein every 6 (six) months., Disp: , Rfl:  .  rivaroxaban (XARELTO) 20 MG TABS tablet, Take 20 mg by mouth daily with supper., Disp: , Rfl:  .  traMADol-acetaminophen (ULTRACET) 37.5-325 MG tablet, Take 1 tablet by mouth every 6 (six) hours as needed (pain)., Disp: , Rfl:  .  triamterene-hydrochlorothiazide (MAXZIDE-25) 37.5-25 MG tablet, Take 1 tablet by mouth daily as needed (for fluid retention/swelling.). , Disp: , Rfl:  .  pantoprazole (PROTONIX) 40 MG tablet, Take 1 tablet (40 mg total) by mouth daily., Disp: 45 tablet, Rfl: 0  Allergies  Allergen Reactions  . Morphine And Related Anaphylaxis and Other (See Comments)    Pt states she stopped breathing post op- "went into resp. arrest"  . Multaq [Dronedarone] Other (See Comments)    Reaction:  Blood in urine and elevated liver enzymes.   . Demerol Nausea And Vomiting  . Flecainide Other (See Comments)    Mobitz II AV Block  . Oysters [Shellfish Allergy] Rash    & CLAMS  . Penicillins Itching and Rash    Has patient had a PCN reaction causing immediate rash, facial/tongue/throat swelling, SOB or lightheadedness with hypotension: No Has patient  had a PCN reaction causing severe rash involving mucus membranes or skin necrosis: Yes Has patient had a PCN reaction that required hospitalization No Has patient had a PCN reaction occurring within the last 10 years: No If all of the above answers are "NO", then may proceed with Cephalosporin use.   . Sulfa Drugs Cross Reactors Rash    Objective:  Vascular Examination: Capillary refill time immediate x 10 digits.  Dorsalis pedis and Posterior tibial pulses palpable bilaterally.  Digital hair x 10 digits was sparse.  Skin temperature gradient WNL b/l.  Dermatological Examination: Skin with normal turgor, texture and  tone b/l.  Toenails 1-5 b/l discolored, thick, dystrophic with subungual debris and pain with palpation to nailbeds due to thickness of nails.  Musculoskeletal: Muscle strength 5/5 to all muscle groups b/l.  Neurological: Sensation with 10 gram monofilament is decreased bilaterally. Vibratory sensation absent b/l.  Assessment: 1. Painful onychomycosis toenails 1-5 b/l 2. NIDDM with neuropathy 3.  Plan: 1. Continue diabetic foot care principles.  Literature dispensed on today. 2. Toenails 1-5 b/l were debrided in length and girth without iatrogenic bleeding. 3. Patient to continue soft, supportive shoe gear. 4. Patient to report any pedal injuries to medical professional. 5. Follow up 3 months.  6. Patient/POA to call should there be a concern in the interim.

## 2018-07-17 ENCOUNTER — Ambulatory Visit: Payer: Medicare Other | Admitting: Internal Medicine

## 2018-07-18 ENCOUNTER — Telehealth: Payer: Self-pay

## 2018-07-18 NOTE — Telephone Encounter (Signed)
Spoke with pt regarding appt on 07/21/18. Pt stated she will check her vitals prior to appt. Pt concerns were address.

## 2018-07-19 NOTE — Telephone Encounter (Signed)
Follow up:      Patient returning your call back concering her appt. Patient states she has a Ipad

## 2018-07-19 NOTE — Telephone Encounter (Signed)
Spoke with pt about appt on 07/21/18. Pt stated she will have her daughter to help her setup her MyChart. Pt was advise to call me if she has any further questions.

## 2018-07-21 ENCOUNTER — Encounter: Payer: Self-pay | Admitting: Internal Medicine

## 2018-07-21 ENCOUNTER — Telehealth (INDEPENDENT_AMBULATORY_CARE_PROVIDER_SITE_OTHER): Payer: Medicare Other | Admitting: Internal Medicine

## 2018-07-21 VITALS — BP 123/68 | HR 76 | Ht 65.0 in | Wt 137.5 lb

## 2018-07-21 DIAGNOSIS — I480 Paroxysmal atrial fibrillation: Secondary | ICD-10-CM | POA: Diagnosis not present

## 2018-07-21 NOTE — Progress Notes (Signed)
Electrophysiology TeleHealth Note   Due to national recommendations of social distancing due to COVID 19, an audio/video telehealth visit is felt to be most appropriate for this patient at this time.  See MyChart message from today for the patient's consent to telehealth for [facility].   Date:  07/21/2018   ID:  Patty [name], DOB 11-Jul-1940, MRN [id number]  Location: patient's home  Provider location: [place]  Evaluation Performed: Follow-up visit  PCP:  Deland Pretty, MD  Cardiologist: Dr Einar Gip Electrophysiologist:  Dr Rayann Heman  Chief Complaint:  afib  History of Present Illness:    Patty [name] is a 78 y.o. female who presents via audio/video conferencing for a telehealth visit today.  Since her ablation, the patient reports doing very well. She is pleased with results and denies procedure related complications.  She did have ERAF  And required cardioversion 05/23/2018.  She states since the cardioversion I have done great! Today, she denies symptoms of palpitations, chest pain, shortness of breath,  lower extremity edema, dizziness, presyncope, or syncope.  The patient is otherwise without complaint today.  The patient denies symptoms of fevers, chills, cough, or new SOB worrisome for COVID 19.  Past Medical History:  Diagnosis Date  . Acute on chronic combined systolic and diastolic CHF (congestive heart failure) ([place])   . Arthritis   . Asthma   . Atrial flutter (HCC)    PAROXYSMAL, EKG 10/06/2017, ATRIAL FLUTTER (ATYPICAL) WITH VARIABLE VENTRICULAR RESPONCE AND CONTROLLED  . Atrial flutter, paroxysmal ([place]) 06/02/2018  . Breast cancer, right ([place]) 2/17   R sided, s/p total mastectomy  . Bulging lumbar disc    3  . Chronic kidney disease    recurring UTI  . Complication of anesthesia    "morphine made me stop breathing"  . Concentric left ventricular hypertrophy    MILD DECREASE IN GLOBAL WALL MOTION. DOPPLER EVIDENCE OF GRADE II (PSEUDONORMAL) DIASTOLIC  DYSFUNCTION, ELEVATED LAP, CALCULATED EF 45%, LEFT ATRIAL CAVITY IS MILDLY DILATED. MILD TO MODERATE AORTIC REGURGITATION. MILD (GRADE I) MITRAL REGURGITATION. MILD TRICUSPID REGURGITATION. ESTIMATED PULMONARY ARTERY SYSTOLIC PRESSURE 22 MMHG.  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 First degree AV block   . HCAP (healthcare-associated pneumonia) 12/2013   Archie Endo 01/18/2014  . History of cardioversion 06/02/2018  . History of skin cancer   . History of transfusion   . Hypertension   . Hypothyroidism   . Left atrial enlargement   . Leg swelling   . 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 Malaise and fatigue   . Nocturia   . Nonischemic cardiomyopathy ([place]) 06/02/2018  . Paroxysmal atrial fibrillation (HCC)   . Persistent atrial fibrillation   . Pure hypercholesterolemia   . Shortness of breath dyspnea    "at times"  . Skin cancer   . Sleep apnea    uses c-pap  . Type II diabetes mellitus ([place])     Past Surgical History:  Procedure Laterality Date  . ABDOMINAL HYSTERECTOMY  1972   partial  . ABDOMINAL HYSTERECTOMY    . APPENDECTOMY  1953  . ATRIAL FIBRILLATION ABLATION  08/19/2015   PVI with CTI ablation by Dr Rayann Heman  . ATRIAL FIBRILLATION ABLATION N/A 04/14/2018   Procedure: ATRIAL FIBRILLATION ABLATION;  Surgeon: Thompson Grayer, MD;  Location: [place] [facility];  Service: Cardiovascular;  Laterality: N/A;  . BACK SURGERY     x2  .  BIOPSY  02/13/2018   Procedure: BIOPSY;  Surgeon: Ronnette Juniper, MD;  Location: [facility];  Service: Gastroenterology;;  X2=EGD & Colon  . BREAST SURGERY     Right mastectomy  . CARDIAC CATHETERIZATION  10/10/2008   Nonobstructive CAD  . CARDIOVERSION  04/13/2011   Procedure: CARDIOVERSION;  Surgeon: Laverda Page, MD;  Location: [place];  Service: Cardiovascular;  Laterality: N/A;  . CARDIOVERSION N/A 06/18/2015   Procedure: CARDIOVERSION;  Surgeon: Adrian Prows, MD;  Location: [facility];  Service: Cardiovascular;  Laterality: N/A;  . CARDIOVERSION N/A 10/26/2016   Procedure: CARDIOVERSION;  Surgeon: Adrian Prows, MD;  Location: [place];  Service: Cardiovascular;  Laterality: N/A;  . CARDIOVERSION N/A 10/11/2017   Procedure: CARDIOVERSION;  Surgeon: Nigel Mormon, MD;  Location: [facility];  Service: Cardiovascular;  Laterality: N/A;  . CARDIOVERSION N/A 05/23/2018   Procedure: CARDIOVERSION;  Surgeon: Adrian Prows, MD;  Location: [place];  Service: Cardiovascular;  Laterality: N/A;  . CATARACT EXTRACTION W/ INTRAOCULAR LENS  IMPLANT, BILATERAL Bilateral   . CHOLECYSTECTOMY  1993  . COLONOSCOPY N/A 02/13/2018   Procedure: COLONOSCOPY;  Surgeon: Ronnette Juniper, MD;  Location: [facility];  Service: Gastroenterology;  Laterality: N/A;  . ELECTROPHYSIOLOGIC STUDY N/A 08/19/2015   Procedure: Atrial Fibrillation Ablation;  Surgeon: Thompson Grayer, MD;  Location: [place] [facility];  Service: Cardiovascular;  Laterality: N/A;  . ESOPHAGOGASTRODUODENOSCOPY N/A 02/13/2018   Procedure: ESOPHAGOGASTRODUODENOSCOPY (EGD);  Surgeon: Ronnette Juniper, MD;  Location: [facility];  Service: Gastroenterology;  Laterality: N/A;  . FOOT SURGERY    . IR REMOVAL TUN ACCESS W/ PORT W/O FL MOD SED  04/01/2017  . LUMBAR LAMINECTOMY    . MASS BIOPSY Left 12/07/2013   Procedure: EXCISIONAL BIOPSY LEFT NECK MASS ;  Surgeon: Jerrell Belfast, MD;  Location: [place];  Service: ENT;  Laterality: Left;  Marland Kitchen MASTECTOMY Right 2017   malignant  . POLYPECTOMY  02/13/2018   Procedure: POLYPECTOMY;  Surgeon: Ronnette Juniper, MD;  Location: [facility];  Service: Gastroenterology;;  EGD  . PORTACATH PLACEMENT  12/2013   Archie Endo 01/04/2014  . PUBOVAGINAL SLING    . SKIN TAG REMOVAL  2012   leg  . TEE WITHOUT CARDIOVERSION N/A 08/19/2015   Procedure: TRANSESOPHAGEAL ECHOCARDIOGRAM (TEE);  Surgeon: Larey Dresser, MD;  Location: [place];  Service: Cardiovascular;  Laterality: N/A;  . TONSILLECTOMY    .  TOTAL MASTECTOMY Right 05/27/2015   Procedure: RIGHT TOTAL MASTECTOMY;  Surgeon: Fanny Skates, MD;  Location: [facility];  Service: General;  Laterality: Right;    Current Outpatient Medications  Medication Sig Dispense Refill  . albuterol (PROVENTIL HFA;VENTOLIN HFA) 108 (90 Base) MCG/ACT inhaler Inhale 1-2 puffs into the lungs every 6 (six) hours as needed for wheezing or shortness of breath.     . anastrozole (ARIMIDEX) 1 MG tablet TAKE ONE TABLET BY MOUTH EVERY DAY (Patient taking differently: Take 1 mg by mouth daily. ) 90 tablet 9  . cephALEXin (KEFLEX) 250 MG capsule Take 250 mg by mouth 1 day or 1 dose.    . Cholecalciferol (VITAMIN D) 2000 UNITS tablet Take 2,000 Units by mouth daily with lunch.    . Cyanocobalamin (B-12) 2500 MCG TABS Take 2,500 mcg by mouth daily with lunch.     . diltiazem (CARDIZEM CD) 120 MG 24 hr capsule Take 1 capsule (120 mg total) by mouth daily. 90 capsule 1  . Fluticasone-Salmeterol (ADVAIR) 250-50 MCG/DOSE AEPB Inhale 1 puff into the lungs every 12 (twelve) hours.     Marland Kitchen  gabapentin (NEURONTIN) 300 MG capsule Take 600 mg by mouth at bedtime.     Marland Kitchen levothyroxine (SYNTHROID, LEVOTHROID) 200 MCG tablet Take 200 mcg by mouth daily before breakfast.    . Magnesium Oxide 500 MG TABS Take 500 mg by mouth 2 (two) times daily.     . metFORMIN (GLUCOPHAGE-XR) 500 MG 24 hr tablet Take 500 mg by mouth 2 (two) times daily with a meal.    . montelukast (SINGULAIR) 10 MG tablet Take 10 mg by mouth daily.    Marland Kitchen omega-3 acid ethyl esters (LOVAZA) 1 g capsule Take 1 g by mouth 2 (two) times daily.    . pravastatin (PRAVACHOL) 40 MG tablet Take 20 mg by mouth See admin instructions. Take 0.5 tablet (20 mg) by mouth on Mondays through Thursdays.  0  . PROLIA 60 MG/ML SOSY injection Inject 60 mg into the vein every 6 (six) months.    . rivaroxaban (XARELTO) 20 MG TABS tablet Take 20 mg by mouth daily with supper.    . traMADol-acetaminophen (ULTRACET) 37.5-325 MG tablet Take 1 tablet  by mouth every 6 (six) hours as needed (pain).    . triamterene-hydrochlorothiazide (MAXZIDE-25) 37.5-25 MG tablet Take 1 tablet by mouth daily as needed (for fluid retention/swelling.).     Marland Kitchen pantoprazole (PROTONIX) 40 MG tablet Take 1 tablet (40 mg total) by mouth daily. 45 tablet 0   No current facility-administered medications for this visit.     Allergies:   Morphine and related; Multaq [dronedarone]; Demerol; Flecainide; Oysters [shellfish allergy]; Penicillins; and Sulfa drugs cross reactors   Social History:  The patient  reports that she has never smoked. She has never used smokeless tobacco. She reports that she does not drink alcohol or use drugs.   Family History:  The patient's  family history includes Arthritis in her mother; Breast cancer (age of onset: 60) in her mother; Cancer (age of onset: 73) in her sister; Diabetes in her sister; Heart attack in her brother; Heart attack (age of onset: 23) in her father; Heart disease in her mother; Heart failure in her mother; Hypertension in her brother and father; Stroke (age of onset: 24) in her father.   ROS:  Please see the history of present illness.   All other systems are personally reviewed and negative.    Exam:    Vital Signs:  BP 123/68   Pulse 76   Ht 5\' 5"  (1.651 m)   Wt 137 lb 8 oz (62.4 kg)   BMI 22.88 kg/m   Well sounding   Labs/Other Tests and Data Reviewed:    Recent Labs: 03/23/2018: ALT 13 05/17/2018: BUN 17; Creatinine, Ser 0.89; Hemoglobin 11.3; Platelets 203; Potassium 4.6; Sodium 138   Wt Readings from Last 3 Encounters:  07/21/18 137 lb 8 oz (62.4 kg)  06/02/18 142 lb 12.8 oz (64.8 kg)  05/23/18 141 lb (64 kg)     Other studies personally reviewed: Additional studies/ records that were reviewed today include: my prior notes, procedure notes,Dr Ganjis notes, AF clinic notes  Review of the above records today demonstrates: as above Prior radiographs:  Cardiac CT 04/07/2018  ASSESSMENT & PLAN:     1.  Atrial flutter/ afib Doing well. She did have ERAF but has done great, without any further symptoms since 05/23/2018 Continue current therapy  COVID 19 screen The patient denies symptoms of COVID 19 at this time.  The importance of social distancing was discussed today.  Follow-up:  With Dr Einar Gip as  scheduled 4 months with me  Current medicines are reviewed at length with the patient today.   The patient does not have concerns regarding her medicines.  The following changes were made today:  none  Labs/ tests ordered today include:  No orders of the defined types were placed in this encounter.   Patient Risk:  after full review of this patients clinical status, I feel that they are at moderate risk at this time.  Today, I have spent 15 minutes with the patient with telehealth technology discussing afib .    Army Fossa, MD  07/21/2018 11:50 AM     [facility] [address] [place]  [place] [address] [phone] (office) [phone] (fax)

## 2018-08-09 DIAGNOSIS — M810 Age-related osteoporosis without current pathological fracture: Secondary | ICD-10-CM | POA: Diagnosis not present

## 2018-08-09 DIAGNOSIS — S79911A Unspecified injury of right hip, initial encounter: Secondary | ICD-10-CM | POA: Diagnosis not present

## 2018-08-09 DIAGNOSIS — I10 Essential (primary) hypertension: Secondary | ICD-10-CM | POA: Diagnosis not present

## 2018-08-09 DIAGNOSIS — M25551 Pain in right hip: Secondary | ICD-10-CM | POA: Diagnosis not present

## 2018-08-09 DIAGNOSIS — W19XXXA Unspecified fall, initial encounter: Secondary | ICD-10-CM | POA: Diagnosis not present

## 2018-08-09 DIAGNOSIS — M549 Dorsalgia, unspecified: Secondary | ICD-10-CM | POA: Diagnosis not present

## 2018-08-09 DIAGNOSIS — S3992XA Unspecified injury of lower back, initial encounter: Secondary | ICD-10-CM | POA: Diagnosis not present

## 2018-08-09 DIAGNOSIS — M545 Low back pain: Secondary | ICD-10-CM | POA: Diagnosis not present

## 2018-09-08 ENCOUNTER — Encounter: Payer: Self-pay | Admitting: Cardiology

## 2018-09-08 ENCOUNTER — Other Ambulatory Visit: Payer: Self-pay

## 2018-09-08 ENCOUNTER — Ambulatory Visit (INDEPENDENT_AMBULATORY_CARE_PROVIDER_SITE_OTHER): Payer: Medicare Other | Admitting: Cardiology

## 2018-09-08 VITALS — BP 142/72 | HR 93 | Ht 66.0 in | Wt 140.1 lb

## 2018-09-08 DIAGNOSIS — I480 Paroxysmal atrial fibrillation: Secondary | ICD-10-CM | POA: Diagnosis not present

## 2018-09-08 DIAGNOSIS — I428 Other cardiomyopathies: Secondary | ICD-10-CM | POA: Diagnosis not present

## 2018-09-08 DIAGNOSIS — I4892 Unspecified atrial flutter: Secondary | ICD-10-CM

## 2018-09-08 NOTE — Progress Notes (Signed)
Subjective:  Primary Physician:  Deland Pretty, MD  Patient ID: Patty [name], female    DOB: 09/19/1940, 78 y.o.   MRN: [id number]  Chief Complaint  Patient presents with  . Atrial Fibrillation  . Follow-up    3 MTH    HPI: [name]  is a 78 y.o. female  with  paroxysmal atrial flutter/atrial fibrillation, symptomatic with fatigue and dyspnea, has had ablation on 08/19/2015 by Dr. Thompson Grayer. She has also had lymphoma which was treated in 2015 and is in remission, right breast cancer, status post lumpectomy in 2017 followed by chemotherapy therapy. She is presently on long-term anticoagulation. Medical history is also significant for controlled diabetes mellitus, hyperlipidemia, hypertension.  Patient with symptomatic atrial fibrillation/atrial flutter with history of atrial fibrillation ablation 2 last ablation 04/14/2018 but was back in atrial fibrillation in March 2024 which she underwent direct current cardioversion.  She has felt remarkably well since then. She now presents for follow-up.  Past Medical History:  Diagnosis Date  . Acute on chronic combined systolic and diastolic CHF (congestive heart failure) ([place])   . Arthritis   . Asthma   . Atrial flutter (HCC)    PAROXYSMAL, EKG 10/06/2017, ATRIAL FLUTTER (ATYPICAL) WITH VARIABLE VENTRICULAR RESPONCE AND CONTROLLED  . Atrial flutter, paroxysmal ([place]) 06/02/2018  . Breast cancer, right ([place]) 2/17   R sided, s/p total mastectomy  . Bulging lumbar disc    3  . Chronic kidney disease    recurring UTI  . Complication of anesthesia    "morphine made me stop breathing"  . Concentric left ventricular hypertrophy    MILD DECREASE IN GLOBAL WALL MOTION. DOPPLER EVIDENCE OF GRADE II (PSEUDONORMAL) DIASTOLIC DYSFUNCTION, ELEVATED LAP, CALCULATED EF 45%, LEFT ATRIAL CAVITY IS MILDLY DILATED. MILD TO MODERATE AORTIC REGURGITATION. MILD (GRADE I) MITRAL REGURGITATION. MILD TRICUSPID REGURGITATION. ESTIMATED PULMONARY ARTERY  SYSTOLIC PRESSURE 22 MMHG.  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 First degree AV block   . HCAP (healthcare-associated pneumonia) 12/2013   Archie Endo 01/18/2014  . History of cardioversion 06/02/2018  . History of skin cancer   . History of transfusion   . Hypertension   . Hypothyroidism   . Left atrial enlargement   . Leg swelling   . 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 Malaise and fatigue   . Nocturia   . Nonischemic cardiomyopathy ([place]) 06/02/2018  . Paroxysmal atrial fibrillation (HCC)   . Persistent atrial fibrillation   . Pure hypercholesterolemia   . Shortness of breath dyspnea    "at times"  . Skin cancer   . Sleep apnea    uses c-pap  . Type II diabetes mellitus ([place])     Past Surgical History:  Procedure Laterality Date  . ABDOMINAL HYSTERECTOMY  1972   partial  . ABDOMINAL HYSTERECTOMY    . APPENDECTOMY  1953  . ATRIAL FIBRILLATION ABLATION  08/19/2015   PVI with CTI ablation by Dr Rayann Heman  . ATRIAL FIBRILLATION ABLATION N/A 04/14/2018   Procedure: ATRIAL FIBRILLATION ABLATION;  Surgeon: Thompson Grayer, MD;  Location: [facility];  Service: Cardiovascular;  Laterality: N/A;  . BACK SURGERY     x2  . BIOPSY  02/13/2018   Procedure: BIOPSY;  Surgeon: Ronnette Juniper, MD;  Location: [facility];  Service: Gastroenterology;;  X2=EGD & Colon  . BREAST SURGERY     Right mastectomy  . CARDIAC CATHETERIZATION  10/10/2008   Nonobstructive CAD  . CARDIOVERSION  04/13/2011   Procedure: CARDIOVERSION;  Surgeon: Laverda Page, MD;  Location: [facility];  Service: Cardiovascular;  Laterality: N/A;  . CARDIOVERSION N/A 06/18/2015   Procedure: CARDIOVERSION;  Surgeon: Adrian Prows, MD;  Location: [facility];  Service: Cardiovascular;  Laterality: N/A;  . CARDIOVERSION N/A 10/26/2016   Procedure: CARDIOVERSION;  Surgeon: Adrian Prows, MD;  Location: [place];  Service: Cardiovascular;  Laterality: N/A;  .  CARDIOVERSION N/A 10/11/2017   Procedure: CARDIOVERSION;  Surgeon: Nigel Mormon, MD;  Location: [facility];  Service: Cardiovascular;  Laterality: N/A;  . CARDIOVERSION N/A 05/23/2018   Procedure: CARDIOVERSION;  Surgeon: Adrian Prows, MD;  Location: [facility];  Service: Cardiovascular;  Laterality: N/A;  . CATARACT EXTRACTION W/ INTRAOCULAR LENS  IMPLANT, BILATERAL Bilateral   . CHOLECYSTECTOMY  1993  . COLONOSCOPY N/A 02/13/2018   Procedure: COLONOSCOPY;  Surgeon: Ronnette Juniper, MD;  Location: [facility];  Service: Gastroenterology;  Laterality: N/A;  . ELECTROPHYSIOLOGIC STUDY N/A 08/19/2015   Procedure: Atrial Fibrillation Ablation;  Surgeon: Thompson Grayer, MD;  Location: [facility];  Service: Cardiovascular;  Laterality: N/A;  . ESOPHAGOGASTRODUODENOSCOPY N/A 02/13/2018   Procedure: ESOPHAGOGASTRODUODENOSCOPY (EGD);  Surgeon: Ronnette Juniper, MD;  Location: [facility];  Service: Gastroenterology;  Laterality: N/A;  . FOOT SURGERY    . IR REMOVAL TUN ACCESS W/ PORT W/O FL MOD SED  04/01/2017  . LUMBAR LAMINECTOMY    . MASS BIOPSY Left 12/07/2013   Procedure: EXCISIONAL BIOPSY LEFT NECK MASS ;  Surgeon: Jerrell Belfast, MD;  Location: [place];  Service: ENT;  Laterality: Left;  Marland Kitchen MASTECTOMY Right 2017   malignant  . POLYPECTOMY  02/13/2018   Procedure: POLYPECTOMY;  Surgeon: Ronnette Juniper, MD;  Location: [facility];  Service: Gastroenterology;;  EGD  . PORTACATH PLACEMENT  12/2013   Archie Endo 01/04/2014  . PUBOVAGINAL SLING    . SKIN TAG REMOVAL  2012   leg  . TEE WITHOUT CARDIOVERSION N/A 08/19/2015   Procedure: TRANSESOPHAGEAL ECHOCARDIOGRAM (TEE);  Surgeon: Larey Dresser, MD;  Location: [place];  Service: Cardiovascular;  Laterality: N/A;  . TONSILLECTOMY    . TOTAL MASTECTOMY Right 05/27/2015   Procedure: RIGHT TOTAL MASTECTOMY;  Surgeon: Fanny Skates, MD;  Location: [facility];  Service: General;  Laterality: Right;    Social History   Socioeconomic History  .  Marital status: Married    Spouse name: Not on file  . Number of children: 2  . Years of education: Not on file  . Highest education level: Not on file  Occupational History  . Not on file  Social Needs  . Financial resource strain: Not on file  . Food insecurity    Worry: Not on file    Inability: Not on file  . Transportation needs    Medical: Not on file    Non-medical: Not on file  Tobacco Use  . Smoking status: Never Smoker  . Smokeless tobacco: Never Used  Substance and Sexual Activity  . Alcohol use: No    Alcohol/week: 0.0 standard drinks  . Drug use: No  . Sexual activity: Not Currently    Birth control/protection: Surgical  Lifestyle  . Physical activity    Days per week: Not on file    Minutes per session: Not on file  . Stress: Not on file  Relationships  . Social Herbalist on phone: Not on file    Gets together: Not on file  Attends religious service: Not on file    Active member of club or organization: Not on file    Attends meetings of clubs or organizations: Not on file    Relationship status: Not on file  . Intimate partner violence    Fear of current or ex partner: Not on file    Emotionally abused: Not on file    Physically abused: Not on file    Forced sexual activity: Not on file  Other Topics Concern  . Not on file  Social History Narrative   Pt lives in [place] with spouse.   Retired Therapist, sports.  Previously worked in [employer].    Current Outpatient Medications on File Prior to Visit  Medication Sig Dispense Refill  . albuterol (PROVENTIL HFA;VENTOLIN HFA) 108 (90 Base) MCG/ACT inhaler Inhale 1-2 puffs into the lungs every 6 (six) hours as needed for wheezing or shortness of breath.     . anastrozole (ARIMIDEX) 1 MG tablet TAKE ONE TABLET BY MOUTH EVERY DAY (Patient taking differently: Take 1 mg by mouth daily. ) 90 tablet 9  . Cholecalciferol (VITAMIN D) 2000 UNITS tablet Take 2,000 Units by mouth daily with lunch.    .  Cyanocobalamin (B-12) 2500 MCG TABS Take 2,500 mcg by mouth daily with lunch.     . diltiazem (CARDIZEM CD) 120 MG 24 hr capsule Take 1 capsule (120 mg total) by mouth daily. 90 capsule 1  . Fluticasone-Salmeterol (ADVAIR) 250-50 MCG/DOSE AEPB Inhale 1 puff into the lungs every 12 (twelve) hours.     . gabapentin (NEURONTIN) 300 MG capsule Take 600 mg by mouth at bedtime.     Marland Kitchen levothyroxine (SYNTHROID, LEVOTHROID) 200 MCG tablet Take 200 mcg by mouth daily before breakfast.    . Magnesium Oxide 500 MG TABS Take 500 mg by mouth 2 (two) times daily.     . metFORMIN (GLUCOPHAGE-XR) 500 MG 24 hr tablet Take 500 mg by mouth 2 (two) times daily with a meal.    . montelukast (SINGULAIR) 10 MG tablet Take 10 mg by mouth daily.    Marland Kitchen omega-3 acid ethyl esters (LOVAZA) 1 g capsule Take 1 g by mouth 2 (two) times daily.    . pravastatin (PRAVACHOL) 40 MG tablet Take 20 mg by mouth See admin instructions. Take 0.5 tablet (20 mg) by mouth on Mondays through Thursdays.  0  . PROLIA 60 MG/ML SOSY injection Inject 60 mg into the vein every 6 (six) months.    . rivaroxaban (XARELTO) 20 MG TABS tablet Take 20 mg by mouth daily with supper.    . traMADol-acetaminophen (ULTRACET) 37.5-325 MG tablet Take 1 tablet by mouth every 6 (six) hours as needed (pain).    . triamterene-hydrochlorothiazide (MAXZIDE-25) 37.5-25 MG tablet Take 1 tablet by mouth daily as needed (for fluid retention/swelling.).      No current facility-administered medications on file prior to visit.     Review of Systems  Constitution: Positive for malaise/fatigue (decreased exercise tolerence). Negative for chills, decreased appetite and weight gain.  Cardiovascular: Positive for dyspnea on exertion (with extreme exertion (has asthma)). Negative for leg swelling and syncope.  Endocrine: Negative for cold intolerance.  Hematologic/Lymphatic: Does not bruise/bleed easily.  Musculoskeletal: Positive for arthritis. Negative for joint swelling.   Gastrointestinal: Negative for abdominal pain, anorexia and change in bowel habit.  Neurological: Negative for headaches and light-headedness.  Psychiatric/Behavioral: Negative for depression and substance abuse.  All other systems reviewed and are negative.     Objective:  Blood pressure (!) 142/72, pulse 93, height 5\' 6"  (1.676 m), weight 140 lb 1.6 oz (63.5 kg), SpO2 97 %. Body mass index is 22.61 kg/m.  Physical Exam  Constitutional: No distress.  Moderately built and petite  HENT:  Head: Atraumatic.  Eyes: Conjunctivae are normal.  Neck: Neck supple. No JVD present. No thyromegaly present.  Cardiovascular: Normal rate, regular rhythm, S1 normal, S2 normal and intact distal pulses. Exam reveals no gallop.  No murmur heard. II/VI SEM in the right parasternal border  Pulmonary/Chest: Effort normal and breath sounds normal.  Abdominal: Soft. Bowel sounds are normal.  Musculoskeletal: Normal range of motion.        General: No edema.  Neurological: She is alert.  Skin: Skin is warm and dry.  Psychiatric: She has a normal mood and affect.   Radiology: No results found.  Laboratory Examination:  CMP Latest Ref Rng & Units 05/17/2018 03/23/2018 03/15/2018  Glucose 70 - 99 mg/dL 136(H) 121(H) 115(H)  BUN 8 - 23 mg/dL 17 9 9   Creatinine 0.44 - 1.00 mg/dL 0.89 0.70 0.58  Sodium 135 - 145 mmol/L 138 142 141  Potassium 3.5 - 5.1 mmol/L 4.6 4.5 4.4  Chloride 98 - 111 mmol/L 103 105 105  CO2 22 - 32 mmol/L 24 28 21   Calcium 8.9 - 10.3 mg/dL 9.9 9.5 9.3  Total Protein 6.5 - 8.1 g/dL - 6.5 -  Total Bilirubin 0.3 - 1.2 mg/dL - 0.5 -  Alkaline Phos 38 - 126 U/L - 77 -  AST 15 - 41 U/L - 16 -  ALT 0 - 44 U/L - 13 -   CBC Latest Ref Rng & Units 05/17/2018 03/23/2018 03/15/2018  WBC 4.0 - 10.5 K/uL 9.4 6.7 7.9  Hemoglobin 12.0 - 15.0 g/dL 11.3(L) 10.9(L) 10.8(L)  Hematocrit 36.0 - 46.0 % 36.2 34.9(L) 32.1(L)  Platelets 150 - 400 K/uL 203 209 206   Lipid Panel     Component Value  Date/Time   CHOL  05/05/2010 0200    128        ATP III CLASSIFICATION:  <200     mg/dL   Desirable  200-239  mg/dL   Borderline High  >=240    mg/dL   High          TRIG 163 (H) 05/05/2010 0200   HDL 44 05/05/2010 0200   CHOLHDL 2.9 05/05/2010 0200   VLDL 33 05/05/2010 0200   LDLCALC  05/05/2010 0200    51        Total Cholesterol/HDL:CHD Risk Coronary Heart Disease Risk Table                     Men   Women  1/2 Average Risk   3.4   3.3  Average Risk       5.0   4.4  2 X Average Risk   9.6   7.1  3 X Average Risk  23.4   11.0        Use the calculated Patient Ratio above and the CHD Risk Table to determine the patient's CHD Risk.        ATP III CLASSIFICATION (LDL):  <100     mg/dL   Optimal  100-129  mg/dL   Near or Above                    Optimal  130-159  mg/dL   Borderline  160-189  mg/dL   High  >190  mg/dL   Very High   HEMOGLOBIN A1C Lab Results  Component Value Date   HGBA1C 5.9 (H) 05/23/2015   MPG 123 05/23/2015   TSH No results for input(s): TSH in the last 8760 hours.  CARDIAC STUDIES:   Coronary angiogram 2010 (no stents-Dr. Peter Martinique)  Mild coronary calcification, Normal EF. Admitted to  in March 2012 for A. Fibrillation with RVR. Stress cardiolite 3/12 [facility]) no ischemia .    Echocardiogram 11/09/2017: Left ventricle cavity is normal in size. Moderate concentric hypertrophy of the left ventricle. Mild decrease in global wall motion. Doppler evidence of grade II (pseudonormal) diastolic dysfunction, elevated LAP. Calculated EF 45%. Left atrial cavity is mildly dilated. Mild to moderate aortic regurgitation. Mild (Grade I) mitral regurgitation. Mild tricuspid regurgitation. Estimated pulmonary artery systolic pressure 22  mmHg.  Assessment:    1. Paroxysmal atrial fibrillation (HCC)  - EKG 12-Lead  2. Atrial flutter, paroxysmal ([place])   3. Nonischemic cardiomyopathy ([place])  (Unable to tolerate Multaq due to  abnorma LFT. Flecainide caused hight degree AV Block.  Tikosyn if recurrent A. FIb)  EKG 09/08/2018: SR  with first-degree AV block at the rate of 86 bpm, left axis deviation, left anterior fascicular block.  Right bundle branch block.  trifascicular block.  Low-voltage complexes. No significant change from  EKG 06/02/2018: Baseline artifact noted.  Sinus rhythm with first-degree AV block at rate of 75 bpm, PR interval 310 ms.  Recommendation: Patient with paroxysmal atrial flutter and atrial fibrillation and has had ablation 2, latest ablation in 04/14/2018,  underwent successful direct current cardioversion on 05/23/2018 and has felt well since. Dyspnea and fatigue has improved. No clinical evidence of congestive heart failure although the LVEF is reduced at 45% by prior echocardiogram.    Blood pressure is well controlled, she is tolerating anticoagulation without bleeding complications.  No changes in the medications were done today.  I'll like to see her back in 6 months for follow-up.  Adrian Prows, MD, [facility] 09/08/2018, 11:06 AM [facility]. [place] Pager: [phone] Office: [phone] If no answer Cell [phone]

## 2018-09-13 DIAGNOSIS — N3020 Other chronic cystitis without hematuria: Secondary | ICD-10-CM | POA: Diagnosis not present

## 2018-09-13 DIAGNOSIS — N312 Flaccid neuropathic bladder, not elsewhere classified: Secondary | ICD-10-CM | POA: Diagnosis not present

## 2018-09-18 ENCOUNTER — Other Ambulatory Visit: Payer: Self-pay | Admitting: Hematology and Oncology

## 2018-09-20 ENCOUNTER — Other Ambulatory Visit: Payer: Self-pay | Admitting: Hematology and Oncology

## 2018-09-20 DIAGNOSIS — C50211 Malignant neoplasm of upper-inner quadrant of right female breast: Secondary | ICD-10-CM

## 2018-09-20 DIAGNOSIS — C8591 Non-Hodgkin lymphoma, unspecified, lymph nodes of head, face, and neck: Secondary | ICD-10-CM

## 2018-09-21 ENCOUNTER — Encounter: Payer: Self-pay | Admitting: Hematology and Oncology

## 2018-09-21 ENCOUNTER — Inpatient Hospital Stay (HOSPITAL_BASED_OUTPATIENT_CLINIC_OR_DEPARTMENT_OTHER): Payer: Medicare Other | Admitting: Hematology and Oncology

## 2018-09-21 ENCOUNTER — Inpatient Hospital Stay: Payer: Medicare Other | Attending: Hematology and Oncology

## 2018-09-21 ENCOUNTER — Other Ambulatory Visit: Payer: Self-pay

## 2018-09-21 DIAGNOSIS — D638 Anemia in other chronic diseases classified elsewhere: Secondary | ICD-10-CM

## 2018-09-21 DIAGNOSIS — C8591 Non-Hodgkin lymphoma, unspecified, lymph nodes of head, face, and neck: Secondary | ICD-10-CM

## 2018-09-21 DIAGNOSIS — C50211 Malignant neoplasm of upper-inner quadrant of right female breast: Secondary | ICD-10-CM

## 2018-09-21 DIAGNOSIS — Z170 Estrogen receptor positive status [ER+]: Secondary | ICD-10-CM

## 2018-09-21 DIAGNOSIS — Z79811 Long term (current) use of aromatase inhibitors: Secondary | ICD-10-CM | POA: Diagnosis not present

## 2018-09-21 LAB — CBC WITH DIFFERENTIAL/PLATELET
Abs Immature Granulocytes: 0.06 10*3/uL (ref 0.00–0.07)
Basophils Absolute: 0 10*3/uL (ref 0.0–0.1)
Basophils Relative: 1 %
Eosinophils Absolute: 0.2 10*3/uL (ref 0.0–0.5)
Eosinophils Relative: 3 %
HCT: 32 % — ABNORMAL LOW (ref 36.0–46.0)
Hemoglobin: 10.5 g/dL — ABNORMAL LOW (ref 12.0–15.0)
Immature Granulocytes: 1 %
Lymphocytes Relative: 19 %
Lymphs Abs: 1.2 10*3/uL (ref 0.7–4.0)
MCH: 30.1 pg (ref 26.0–34.0)
MCHC: 32.8 g/dL (ref 30.0–36.0)
MCV: 91.7 fL (ref 80.0–100.0)
Monocytes Absolute: 0.5 10*3/uL (ref 0.1–1.0)
Monocytes Relative: 8 %
Neutro Abs: 4.2 10*3/uL (ref 1.7–7.7)
Neutrophils Relative %: 68 %
Platelets: 194 10*3/uL (ref 150–400)
RBC: 3.49 MIL/uL — ABNORMAL LOW (ref 3.87–5.11)
RDW: 14 % (ref 11.5–15.5)
WBC: 6.1 10*3/uL (ref 4.0–10.5)
nRBC: 0 % (ref 0.0–0.2)

## 2018-09-21 LAB — COMPREHENSIVE METABOLIC PANEL
ALT: 17 U/L (ref 0–44)
AST: 18 U/L (ref 15–41)
Albumin: 4.1 g/dL (ref 3.5–5.0)
Alkaline Phosphatase: 78 U/L (ref 38–126)
Anion gap: 11 (ref 5–15)
BUN: 18 mg/dL (ref 8–23)
CO2: 24 mmol/L (ref 22–32)
Calcium: 9.5 mg/dL (ref 8.9–10.3)
Chloride: 105 mmol/L (ref 98–111)
Creatinine, Ser: 0.93 mg/dL (ref 0.44–1.00)
GFR calc Af Amer: >60 mL/min (ref >60)
GFR calc non Af Amer: 58 mL/min — ABNORMAL LOW (ref >60)
Glucose, Bld: 188 mg/dL — ABNORMAL HIGH (ref 70–99)
Potassium: 4.1 mmol/L (ref 3.5–5.1)
Sodium: 140 mmol/L (ref 135–145)
Total Bilirubin: 0.5 mg/dL (ref 0.3–1.2)
Total Protein: 6.3 g/dL — ABNORMAL LOW (ref 6.5–8.1)

## 2018-09-21 NOTE — Progress Notes (Signed)
[facility] OFFICE PROGRESS NOTE  Patient Care Team: Deland Pretty, MD as PCP - General (Internal Medicine) Adrian Prows, MD as Consulting Physician (Cardiology) Heath Lark, MD as Consulting Physician (Hematology and Oncology) Fanny Skates, MD as Consulting Physician (General Surgery) Eppie Gibson, MD as Attending Physician (Radiation Oncology) Sylvan Cheese, NP as Nurse Practitioner (Hematology and Oncology) Thompson Grayer, MD as Consulting Physician (Cardiology) Lahoma Rocker, MD as Consulting Physician (Rheumatology) Terrance Mass, MD (Inactive) as Consulting Physician (Gynecology)  ASSESSMENT & PLAN:  Breast cancer of upper-inner quadrant of right female breast [facility]) She is taking Arimidex since 07/14/2015 as part of the adjuvant treatment for breast cancer. So far, she tolerated treatment well without any side effects. I will continue to see her on the regular basis including breast examination  Her recent mammogram is benign Examination is benign today She will continue Arimidex for  5 years. She was started on Prolia by her primary care doctor along with calcium and vitamin D for osteoporosis prevention.  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 The patient denies recent history of bleeding such as epistaxis, hematuria or hematochezia. She is asymptomatic from the anemia. We will observe for now.    No orders of the defined types were placed in this encounter.   INTERVAL HISTORY: Please see below for problem oriented charting. 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   01/03/2014 Bone Marrow Biopsy   Accession: [id number] Bone marrow biopsy is negative   01/04/2014 Imaging   ECHO showed normal EF   01/04/2014 Procedure   She has placement of port   01/09/2014 - 03/07/2014 Chemotherapy   She was given treatment with bendamustine with rituximab. Treatment was stopped due to severe side-effects despite significant dose adjustment for cycle 2   01/18/2014 - 02/01/2014 Hospital Admission   She was admitted to the hospital from Escherichia coli sepsis with multiorgan failure and brief episodes of intubation. She was discharged to skilled nursing facility   02/26/2014 Imaging   PET/CT scan showed near complete remission   02/27/2014 Adverse Reaction   Cycle 2 of treatment was resumed with drastic dose adjustment to bendamustine due to recent multi-organ failure   04/03/2014 - 03/13/2015 Chemotherapy   She is started on  maintenance rituximab only.   04/09/2014 - 04/12/2014 Hospital Admission   The patient was admitted to the hospital with urinary tract infection and sepsis.   06/05/2014 Imaging   PET CT scan showed complete  response to Rx   02/12/2015 Imaging   Ct scan showed no evidence of disease. It shows she has new pneumonia   04/02/2015 Miscellaneous   She received 1 dose IVIG complicated by mild infusion reaction   04/26/2015 - 04/30/2015 Hospital Admission   She had recurrent admission to the hospital with sepsis   10/22/2015 Imaging   CT scan of chest and abdomen showed Ill-defined tissue in the porta hepatis suggest treated lymphoma. No evidence of lymphadenopathy in the chest, abdomen & pelvis to suggest recurrent lymphoma.   10/20/2016 Imaging   1. Stable appearance of ill defined soft tissue within the porta hepatis compatible with treated lymphoma. 2. No new findings identified. 3. Aortic Atherosclerosis (ICD10-I70.0). LAD coronary artery calcifications noted.   04/01/2017 Procedure   Status post right IJ port catheter removal   Breast cancer of upper-inner quadrant of right female breast ([place])  11/06/2014 Imaging   DEXA scan showed osteopenia T-1.9 on bilateral femoral neck   03/31/2015 Imaging   Screening mammogram showed suspicious lesion, confirmed on diagnostic imaging at 2 and 230 position on the right breast   04/10/2015 Pathology Results   Accession: [id number] breast biopsy in 2 locations came back invasive ductal carcinoma with calcification, 100% ER and PR positive, HER 2 neg   04/10/2015 Clinical Stage   Stage IA: T1 N0   05/27/2015 Surgery   Right total mastectomy: multifocal IDC, 1.5 and 1.0 cm, neg margins, ER+ (100% - both); PR+ (100% and 95%), HER2neu negative (ratio 1.69 and 1.14) Ki67 2% and 10%. DCIS   05/27/2015 Pathologic Stage   Stage IA: mpT1c pNx pMx   06/17/2015 Survivorship   Survivorship care plan mailed to patient at her request   07/14/2015 -  Anti-estrogen oral therapy   She started taking Arimidex   04/01/2016 Imaging   No mammographic evidence of malignancy in the left breast.   04/08/2017 Imaging   Screening mammogram in one year. Neg     REVIEW OF SYSTEMS:    Constitutional: Denies fevers, chills or abnormal weight loss Eyes: Denies blurriness of vision Ears, nose, mouth, throat, and face: Denies mucositis or sore throat Respiratory: Denies cough, dyspnea or wheezes Cardiovascular: Denies palpitation, chest discomfort or lower extremity swelling Gastrointestinal:  Denies nausea, heartburn or change in bowel habits Skin: Denies abnormal skin rashes Lymphatics: Denies new lymphadenopathy or easy bruising Neurological:Denies numbness, tingling or new weaknesses Behavioral/Psych: Mood is stable, no new changes  All other systems were reviewed with the patient and are negative.  I have reviewed the past medical history, past surgical history, social history and family history with the patient and they are unchanged from previous note.  ALLERGIES:  is allergic to morphine and related; multaq [dronedarone]; demerol; flecainide; oysters [shellfish allergy]; penicillins; and sulfa drugs cross reactors.  MEDICATIONS:  Current Outpatient Medications  Medication Sig Dispense Refill  . albuterol (PROVENTIL HFA;VENTOLIN HFA) 108 (90 Base) MCG/ACT inhaler Inhale 1-2 puffs into the lungs every 6 (six) hours as needed for wheezing or shortness of breath.     . anastrozole (ARIMIDEX) 1 MG tablet Take 1 tablet (1 mg total) by mouth daily. 90 tablet 3  . Cholecalciferol (VITAMIN D) 2000 UNITS tablet Take 2,000 Units by mouth daily with lunch.    . Cyanocobalamin (B-12)  2500 MCG TABS Take 2,500 mcg by mouth daily with lunch.     . diltiazem (CARDIZEM CD) 120 MG 24 hr capsule Take 1 capsule (120 mg total) by mouth daily. 90 capsule 1  . Fluticasone-Salmeterol (ADVAIR) 250-50 MCG/DOSE AEPB Inhale 1 puff into the lungs every 12 (twelve) hours.     . gabapentin (NEURONTIN) 300 MG capsule Take 600 mg by mouth at bedtime.     Marland Kitchen levothyroxine (SYNTHROID, LEVOTHROID) 200 MCG tablet Take 200 mcg by mouth daily before breakfast.    . Magnesium Oxide 500 MG TABS Take 500 mg  by mouth 2 (two) times daily.     . metFORMIN (GLUCOPHAGE-XR) 500 MG 24 hr tablet Take 500 mg by mouth 2 (two) times daily with a meal.    . montelukast (SINGULAIR) 10 MG tablet Take 10 mg by mouth daily.    Marland Kitchen omega-3 acid ethyl esters (LOVAZA) 1 g capsule Take 1 g by mouth 2 (two) times daily.    . pravastatin (PRAVACHOL) 40 MG tablet Take 20 mg by mouth See admin instructions. Take 0.5 tablet (20 mg) by mouth on Mondays through Thursdays.  0  . PROLIA 60 MG/ML SOSY injection Inject 60 mg into the vein every 6 (six) months.    . rivaroxaban (XARELTO) 20 MG TABS tablet Take 20 mg by mouth daily with supper.    . traMADol-acetaminophen (ULTRACET) 37.5-325 MG tablet Take 1 tablet by mouth every 6 (six) hours as needed (pain).    . triamterene-hydrochlorothiazide (MAXZIDE-25) 37.5-25 MG tablet Take 1 tablet by mouth daily as needed (for fluid retention/swelling.).      No current facility-administered medications for this visit.     PHYSICAL EXAMINATION: ECOG PERFORMANCE STATUS: 1 - Symptomatic but completely ambulatory  Vitals:   09/21/18 1001  BP: (!) 125/53  Pulse: 91  Resp: 17  Temp: 98.2 F (36.8 C)  SpO2: 98%   Filed Weights   09/21/18 1001  Weight: 141 lb 3.2 oz (64 kg)    GENERAL:alert, no distress and comfortable SKIN: skin color, texture, turgor are normal, no rashes or significant lesions EYES: normal, Conjunctiva are pink and non-injected, sclera clear OROPHARYNX:no exudate, no erythema and lips, buccal mucosa, and tongue normal  NECK: supple, thyroid normal size, non-tender, without nodularity LYMPH:  no palpable lymphadenopathy in the cervical, axillary or inguinal LUNGS: clear to auscultation and percussion with normal breathing effort HEART: regular rate & rhythm and no murmurs and no lower extremity edema ABDOMEN:abdomen soft, non-tender and normal bowel sounds Musculoskeletal:no cyanosis of digits and no clubbing  NEURO: alert & oriented x 3 with fluent speech,  no focal motor/sensory deficits Chest wall examination revealed well-healed mastectomy scar on the right.  Normal breast exam on the left  LABORATORY DATA:  I have reviewed the data as listed    Component Value Date/Time   NA 140 09/21/2018 0930   NA 141 03/15/2018 1258   NA 140 01/27/2017 0811   K 4.1 09/21/2018 0930   K 3.9 01/27/2017 0811   CL 105 09/21/2018 0930   CO2 24 09/21/2018 0930   CO2 25 01/27/2017 0811   GLUCOSE 188 (H) 09/21/2018 0930   GLUCOSE 122 01/27/2017 0811   BUN 18 09/21/2018 0930   BUN 9 03/15/2018 1258   BUN 16.9 01/27/2017 0811   CREATININE 0.93 09/21/2018 0930   CREATININE 0.70 03/23/2018 1000   CREATININE 0.8 01/27/2017 0811   CALCIUM 9.5 09/21/2018 0930   CALCIUM 9.9 01/27/2017 0811   PROT  6.3 (L) 09/21/2018 0930   PROT 6.9 01/27/2017 0811   ALBUMIN 4.1 09/21/2018 0930   ALBUMIN 4.4 01/27/2017 0811   AST 18 09/21/2018 0930   AST 16 03/23/2018 1000   AST 15 01/27/2017 0811   ALT 17 09/21/2018 0930   ALT 13 03/23/2018 1000   ALT 15 01/27/2017 0811   ALKPHOS 78 09/21/2018 0930   ALKPHOS 77 01/27/2017 0811   BILITOT 0.5 09/21/2018 0930   BILITOT 0.5 03/23/2018 1000   BILITOT 0.65 01/27/2017 0811   GFRNONAA 58 (L) 09/21/2018 0930   GFRNONAA >60 03/23/2018 1000   GFRAA >60 09/21/2018 0930   GFRAA >60 03/23/2018 1000    No results found for: SPEP, UPEP  Lab Results  Component Value Date   WBC 6.1 09/21/2018   NEUTROABS 4.2 09/21/2018   HGB 10.5 (L) 09/21/2018   HCT 32.0 (L) 09/21/2018   MCV 91.7 09/21/2018   PLT 194 09/21/2018      Chemistry      Component Value Date/Time   NA 140 09/21/2018 0930   NA 141 03/15/2018 1258   NA 140 01/27/2017 0811   K 4.1 09/21/2018 0930   K 3.9 01/27/2017 0811   CL 105 09/21/2018 0930   CO2 24 09/21/2018 0930   CO2 25 01/27/2017 0811   BUN 18 09/21/2018 0930   BUN 9 03/15/2018 1258   BUN 16.9 01/27/2017 0811   CREATININE 0.93 09/21/2018 0930   CREATININE 0.70 03/23/2018 1000   CREATININE  0.8 01/27/2017 0811      Component Value Date/Time   CALCIUM 9.5 09/21/2018 0930   CALCIUM 9.9 01/27/2017 0811   ALKPHOS 78 09/21/2018 0930   ALKPHOS 77 01/27/2017 0811   AST 18 09/21/2018 0930   AST 16 03/23/2018 1000   AST 15 01/27/2017 0811   ALT 17 09/21/2018 0930   ALT 13 03/23/2018 1000   ALT 15 01/27/2017 0811   BILITOT 0.5 09/21/2018 0930   BILITOT 0.5 03/23/2018 1000   BILITOT 0.65 01/27/2017 0811      All questions were answered. The patient knows to call the clinic with any problems, questions or concerns. No barriers to learning was detected.  I spent 15 minutes counseling the patient face to face. The total time spent in the appointment was 20 minutes and more than 50% was on counseling and review of test results  Heath Lark, MD 09/21/2018 2:43 PM

## 2018-09-21 NOTE — Assessment & Plan Note (Signed)
Her last CT scan which showed no evidence of active disease She has completed chemotherapy treatment with no signs of recurrent for more than 2 years I will continue to examine her in 6 months

## 2018-09-21 NOTE — Assessment & Plan Note (Signed)
She is taking Arimidex since 07/14/2015 as part of the adjuvant treatment for breast cancer. So far, she tolerated treatment well without any side effects. I will continue to see her on the regular basis including breast examination  Her recent mammogram is benign Examination is benign today She will continue Arimidex for  5 years. She was started on Prolia by her primary care doctor along with calcium and vitamin D for osteoporosis prevention.

## 2018-09-21 NOTE — Assessment & Plan Note (Signed)
This is likely anemia of chronic disease. The patient denies recent history of bleeding such as epistaxis, hematuria or hematochezia. She is asymptomatic from the anemia. We will observe for now.  

## 2018-09-25 ENCOUNTER — Telehealth: Payer: Self-pay | Admitting: Hematology and Oncology

## 2018-09-25 NOTE — Telephone Encounter (Signed)
I talk with patient regarding schedule  

## 2018-09-27 ENCOUNTER — Other Ambulatory Visit: Payer: Self-pay

## 2018-09-27 ENCOUNTER — Encounter: Payer: Self-pay | Admitting: Podiatry

## 2018-09-27 ENCOUNTER — Ambulatory Visit (INDEPENDENT_AMBULATORY_CARE_PROVIDER_SITE_OTHER): Payer: Medicare Other | Admitting: Podiatry

## 2018-09-27 VITALS — Temp 97.5°F

## 2018-09-27 DIAGNOSIS — M79674 Pain in right toe(s): Secondary | ICD-10-CM | POA: Diagnosis not present

## 2018-09-27 DIAGNOSIS — B351 Tinea unguium: Secondary | ICD-10-CM

## 2018-09-27 DIAGNOSIS — E1142 Type 2 diabetes mellitus with diabetic polyneuropathy: Secondary | ICD-10-CM

## 2018-09-27 DIAGNOSIS — M79675 Pain in left toe(s): Secondary | ICD-10-CM | POA: Diagnosis not present

## 2018-09-27 NOTE — Patient Instructions (Signed)
Diabetes Mellitus and Foot Care Foot care is an important part of your health, especially when you have diabetes. Diabetes may cause you to have problems because of poor blood flow (circulation) to your feet and legs, which can cause your skin to:  Become thinner and drier.  Break more easily.  Heal more slowly.  Peel and crack. You may also have nerve damage (neuropathy) in your legs and feet, causing decreased feeling in them. This means that you may not notice minor injuries to your feet that could lead to more serious problems. Noticing and addressing any potential problems early is the best way to prevent future foot problems. How to care for your feet Foot hygiene  Wash your feet daily with warm water and mild soap. Do not use hot water. Then, pat your feet and the areas between your toes until they are completely dry. Do not soak your feet as this can dry your skin.  Trim your toenails straight across. Do not dig under them or around the cuticle. File the edges of your nails with an emery board or nail file.  Apply a moisturizing lotion or petroleum jelly to the skin on your feet and to dry, brittle toenails. Use lotion that does not contain alcohol and is unscented. Do not apply lotion between your toes. Shoes and socks  Wear clean socks or stockings every day. Make sure they are not too tight. Do not wear knee-high stockings since they may decrease blood flow to your legs.  Wear shoes that fit properly and have enough cushioning. Always look in your shoes before you put them on to be sure there are no objects inside.  To break in new shoes, wear them for just a few hours a day. This prevents injuries on your feet. Wounds, scrapes, corns, and calluses  Check your feet daily for blisters, cuts, bruises, sores, and redness. If you cannot see the bottom of your feet, use a mirror or ask someone for help.  Do not cut corns or calluses or try to remove them with medicine.  If you  find a minor scrape, cut, or break in the skin on your feet, keep it and the skin around it clean and dry. You may clean these areas with mild soap and water. Do not clean the area with peroxide, alcohol, or iodine.  If you have a wound, scrape, corn, or callus on your foot, look at it several times a day to make sure it is healing and not infected. Check for: ? Redness, swelling, or pain. ? Fluid or blood. ? Warmth. ? Pus or a bad smell. General instructions  Do not cross your legs. This may decrease blood flow to your feet.  Do not use heating pads or hot water bottles on your feet. They may burn your skin. If you have lost feeling in your feet or legs, you may not know this is happening until it is too late.  Protect your feet from hot and cold by wearing shoes, such as at the beach or on hot pavement.  Schedule a complete foot exam at least once a year (annually) or more often if you have foot problems. If you have foot problems, report any cuts, sores, or bruises to your health care provider immediately. Contact a health care provider if:  You have a medical condition that increases your risk of infection and you have any cuts, sores, or bruises on your feet.  You have an injury that is not   healing.  You have redness on your legs or feet.  You feel burning or tingling in your legs or feet.  You have pain or cramps in your legs and feet.  Your legs or feet are numb.  Your feet always feel cold.  You have pain around a toenail. Get help right away if:  You have a wound, scrape, corn, or callus on your foot and: ? You have pain, swelling, or redness that gets worse. ? You have fluid or blood coming from the wound, scrape, corn, or callus. ? Your wound, scrape, corn, or callus feels warm to the touch. ? You have pus or a bad smell coming from the wound, scrape, corn, or callus. ? You have a fever. ? You have a red line going up your leg. Summary  Check your feet every day  for cuts, sores, red spots, swelling, and blisters.  Moisturize feet and legs daily.  Wear shoes that fit properly and have enough cushioning.  If you have foot problems, report any cuts, sores, or bruises to your health care provider immediately.  Schedule a complete foot exam at least once a year (annually) or more often if you have foot problems. This information is not intended to replace advice given to you by your health care provider. Make sure you discuss any questions you have with your health care provider. Document Released: 02/13/2000 Document Revised: 03/30/2017 Document Reviewed: 03/19/2016 Elsevier Patient Education  2020 [employer]   Onychomycosis/Fungal Toenails  WHAT IS IT? An infection that lies within the keratin of your nail plate that is caused by a fungus.  WHY ME? Fungal infections affect all ages, sexes, races, and creeds.  There may be many factors that predispose you to a fungal infection such as age, coexisting medical conditions such as diabetes, or an autoimmune disease; stress, medications, fatigue, genetics, etc.  Bottom line: fungus thrives in a warm, moist environment and your shoes offer such a location.  IS IT CONTAGIOUS? Theoretically, yes.  You do not want to share shoes, nail clippers or files with someone who has fungal toenails.  Walking around barefoot in the same room or sleeping in the same bed is unlikely to transfer the organism.  It is important to realize, however, that fungus can spread easily from one nail to the next on the same foot.  HOW DO WE TREAT THIS?  There are several ways to treat this condition.  Treatment may depend on many factors such as age, medications, pregnancy, liver and kidney conditions, etc.  It is best to ask your doctor which options are available to you.  1. No treatment.   Unlike many other medical concerns, you can live with this condition.  However for many people this can be a painful condition and may lead to  ingrown toenails or a bacterial infection.  It is recommended that you keep the nails cut short to help reduce the amount of fungal nail. 2. Topical treatment.  These range from herbal remedies to prescription strength nail lacquers.  About 40-50% effective, topicals require twice daily application for approximately 9 to 12 months or until an entirely new nail has grown out.  The most effective topicals are medical grade medications available through physicians offices. 3. Oral antifungal medications.  With an 80-90% cure rate, the most common oral medication requires 3 to 4 months of therapy and stays in your system for a year as the new nail grows out.  Oral antifungal medications do require   blood work to make sure it is a safe drug for you.  A liver function panel will be performed prior to starting the medication and after the first month of treatment.  It is important to have the blood work performed to avoid any harmful side effects.  In general, this medication safe but blood work is required. 4. Laser Therapy.  This treatment is performed by applying a specialized laser to the affected nail plate.  This therapy is noninvasive, fast, and non-painful.  It is not covered by insurance and is therefore, out of pocket.  The results have been very good with a 80-95% cure rate.  The [facility] is the only practice in the area to offer this therapy. 5. Permanent Nail Avulsion.  Removing the entire nail so that a new nail will not grow back. 

## 2018-09-27 NOTE — Progress Notes (Signed)
Subjective:  Patty [name] presents to clinic today with cc of  painful, thick, discolored, elongated toenails 1-5 b/l that become tender and cannot cut because of thickness. Pain is aggravated when wearing enclosed shoe gear.  Deland Pretty, MD is her PCP.    Current Outpatient Medications:  .  albuterol (PROVENTIL HFA;VENTOLIN HFA) 108 (90 Base) MCG/ACT inhaler, Inhale 1-2 puffs into the lungs every 6 (six) hours as needed for wheezing or shortness of breath. , Disp: , Rfl:  .  anastrozole (ARIMIDEX) 1 MG tablet, Take 1 tablet (1 mg total) by mouth daily., Disp: 90 tablet, Rfl: 3 .  Cholecalciferol (VITAMIN D) 2000 UNITS tablet, Take 2,000 Units by mouth daily with lunch., Disp: , Rfl:  .  Cyanocobalamin (B-12) 2500 MCG TABS, Take 2,500 mcg by mouth daily with lunch. , Disp: , Rfl:  .  diltiazem (CARDIZEM CD) 120 MG 24 hr capsule, Take 1 capsule (120 mg total) by mouth daily., Disp: 90 capsule, Rfl: 1 .  Fluticasone-Salmeterol (ADVAIR) 250-50 MCG/DOSE AEPB, Inhale 1 puff into the lungs every 12 (twelve) hours. , Disp: , Rfl:  .  gabapentin (NEURONTIN) 300 MG capsule, Take 600 mg by mouth at bedtime. , Disp: , Rfl:  .  levothyroxine (SYNTHROID, LEVOTHROID) 200 MCG tablet, Take 200 mcg by mouth daily before breakfast., Disp: , Rfl:  .  Magnesium Oxide 500 MG TABS, Take 500 mg by mouth 2 (two) times daily. , Disp: , Rfl:  .  metFORMIN (GLUCOPHAGE-XR) 500 MG 24 hr tablet, Take 500 mg by mouth 2 (two) times daily with a meal., Disp: , Rfl:  .  montelukast (SINGULAIR) 10 MG tablet, Take 10 mg by mouth daily., Disp: , Rfl:  .  omega-3 acid ethyl esters (LOVAZA) 1 g capsule, Take 1 g by mouth 2 (two) times daily., Disp: , Rfl:  .  pravastatin (PRAVACHOL) 40 MG tablet, Take 20 mg by mouth See admin instructions. Take 0.5 tablet (20 mg) by mouth on Mondays through Thursdays., Disp: , Rfl: 0 .  PROLIA 60 MG/ML SOSY injection, Inject 60 mg into the vein every 6 (six) months., Disp: , Rfl:  .   rivaroxaban (XARELTO) 20 MG TABS tablet, Take 20 mg by mouth daily with supper., Disp: , Rfl:  .  traMADol-acetaminophen (ULTRACET) 37.5-325 MG tablet, Take 1 tablet by mouth every 6 (six) hours as needed (pain)., Disp: , Rfl:  .  triamterene-hydrochlorothiazide (MAXZIDE-25) 37.5-25 MG tablet, Take 1 tablet by mouth daily as needed (for fluid retention/swelling.). , Disp: , Rfl:    Allergies  Allergen Reactions  . Morphine And Related Anaphylaxis and Other (See Comments)    Pt states she stopped breathing post op- "went into resp. arrest"  . Multaq [Dronedarone] Other (See Comments)    Reaction:  Blood in urine and elevated liver enzymes.   . Demerol Nausea And Vomiting  . Flecainide Other (See Comments)    Mobitz II AV Block  . Oysters [Shellfish Allergy] Rash    & CLAMS  . Penicillins Itching and Rash    Has patient had a PCN reaction causing immediate rash, facial/tongue/throat swelling, SOB or lightheadedness with hypotension: No Has patient had a PCN reaction causing severe rash involving mucus membranes or skin necrosis: Yes Has patient had a PCN reaction that required hospitalization No Has patient had a PCN reaction occurring within the last 10 years: No If all of the above answers are "NO", then may proceed with Cephalosporin use.   . Sulfa Drugs Cross  Reactors Rash     Objective: Vitals:   09/27/18 1113  Temp: (!) 97.5 F (36.4 C)    Physical Examination:  Vascular Examination: Capillary refill time immediate x 10 digits.  Palpable DP/PT pulses b/l.  Digital hair sparse b/l.  No edema noted b/l.  Skin temperature gradient WNL b/l.  Dermatological Examination: Skin with normal turgor, texture and tone b/l.  No open wounds b/l.  No interdigital macerations noted b/l.  Elongated, thick, discolored brittle toenails with subungual debris and pain on dorsal palpation of nailbeds 1-5 b/l.  Musculoskeletal Examination: Muscle strength 5/5 to all muscle groups  b/l  Neurological Examination: Sensation decreased b/l with 10 gram monofilament.  Vibratory sensation absent b/l.  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 Continue diabetic foot care principles. Literature dispensed on today. 2. Toenails 1-5 b/l were debrided in length and girth without iatrogenic laceration. 2.  Continue soft, supportive shoe gear daily. 3.  Report any pedal injuries to medical professional. 4.  Follow up 3 months. 5.  Patient/POA to call should there be a question/concern in there interim.

## 2018-10-13 DIAGNOSIS — I10 Essential (primary) hypertension: Secondary | ICD-10-CM | POA: Diagnosis not present

## 2018-10-13 DIAGNOSIS — E039 Hypothyroidism, unspecified: Secondary | ICD-10-CM | POA: Diagnosis not present

## 2018-10-13 DIAGNOSIS — E11610 Type 2 diabetes mellitus with diabetic neuropathic arthropathy: Secondary | ICD-10-CM | POA: Diagnosis not present

## 2018-10-13 DIAGNOSIS — M810 Age-related osteoporosis without current pathological fracture: Secondary | ICD-10-CM | POA: Diagnosis not present

## 2018-10-18 DIAGNOSIS — I10 Essential (primary) hypertension: Secondary | ICD-10-CM | POA: Diagnosis not present

## 2018-10-18 DIAGNOSIS — M810 Age-related osteoporosis without current pathological fracture: Secondary | ICD-10-CM | POA: Diagnosis not present

## 2018-10-18 DIAGNOSIS — D509 Iron deficiency anemia, unspecified: Secondary | ICD-10-CM | POA: Diagnosis not present

## 2018-10-18 DIAGNOSIS — R918 Other nonspecific abnormal finding of lung field: Secondary | ICD-10-CM | POA: Diagnosis not present

## 2018-10-18 DIAGNOSIS — N390 Urinary tract infection, site not specified: Secondary | ICD-10-CM | POA: Diagnosis not present

## 2018-10-18 DIAGNOSIS — E11610 Type 2 diabetes mellitus with diabetic neuropathic arthropathy: Secondary | ICD-10-CM | POA: Diagnosis not present

## 2018-10-18 DIAGNOSIS — I480 Paroxysmal atrial fibrillation: Secondary | ICD-10-CM | POA: Diagnosis not present

## 2018-10-18 DIAGNOSIS — E039 Hypothyroidism, unspecified: Secondary | ICD-10-CM | POA: Diagnosis not present

## 2018-10-18 DIAGNOSIS — M545 Low back pain: Secondary | ICD-10-CM | POA: Diagnosis not present

## 2018-10-18 DIAGNOSIS — J45909 Unspecified asthma, uncomplicated: Secondary | ICD-10-CM | POA: Diagnosis not present

## 2018-10-18 DIAGNOSIS — R9389 Abnormal findings on diagnostic imaging of other specified body structures: Secondary | ICD-10-CM | POA: Diagnosis not present

## 2018-10-18 DIAGNOSIS — C8590 Non-Hodgkin lymphoma, unspecified, unspecified site: Secondary | ICD-10-CM | POA: Diagnosis not present

## 2018-10-18 DIAGNOSIS — Z0001 Encounter for general adult medical examination with abnormal findings: Secondary | ICD-10-CM | POA: Diagnosis not present

## 2018-10-18 DIAGNOSIS — G629 Polyneuropathy, unspecified: Secondary | ICD-10-CM | POA: Diagnosis not present

## 2018-10-23 DIAGNOSIS — Z1212 Encounter for screening for malignant neoplasm of rectum: Secondary | ICD-10-CM | POA: Diagnosis not present

## 2018-11-02 ENCOUNTER — Telehealth: Payer: Self-pay | Admitting: Hematology and Oncology

## 2018-11-02 ENCOUNTER — Telehealth: Payer: Self-pay

## 2018-11-02 ENCOUNTER — Other Ambulatory Visit: Payer: Self-pay | Admitting: Hematology and Oncology

## 2018-11-02 DIAGNOSIS — D500 Iron deficiency anemia secondary to blood loss (chronic): Secondary | ICD-10-CM

## 2018-11-02 NOTE — Telephone Encounter (Signed)
Called regarding referral from [facility]. Ask if she is taking iron OTC. She took it for a week daily prior to seeing PCP. She developed severe constipation and she has finally recovered. She takes daily Metamucil and has for years. When she saw PCP she was told to stop the Iron OTC due to the constipation.

## 2018-11-02 NOTE — Telephone Encounter (Signed)
Called and given below message. She verbalized understanding. She is available on 9/18 for appt.

## 2018-11-02 NOTE — Telephone Encounter (Signed)
I sent scheduling msg 

## 2018-11-02 NOTE — Telephone Encounter (Signed)
Scheduled appt per 9/03 sch message - unable to reach pt . Called and left vmail with appt date and time

## 2018-11-02 NOTE — Telephone Encounter (Signed)
I can go ahead and schedule IV iron the week after next If she agrees I can see her and scehdule IV iron on 9/18. Let me know and I will put in order

## 2018-11-15 ENCOUNTER — Ambulatory Visit (INDEPENDENT_AMBULATORY_CARE_PROVIDER_SITE_OTHER): Payer: Medicare Other | Admitting: Physical Medicine and Rehabilitation

## 2018-11-15 ENCOUNTER — Encounter: Payer: Self-pay | Admitting: Physical Medicine and Rehabilitation

## 2018-11-15 ENCOUNTER — Telehealth: Payer: Self-pay | Admitting: Radiology

## 2018-11-15 DIAGNOSIS — G5601 Carpal tunnel syndrome, right upper limb: Secondary | ICD-10-CM

## 2018-11-15 DIAGNOSIS — M961 Postlaminectomy syndrome, not elsewhere classified: Secondary | ICD-10-CM

## 2018-11-15 DIAGNOSIS — R202 Paresthesia of skin: Secondary | ICD-10-CM | POA: Diagnosis not present

## 2018-11-15 DIAGNOSIS — G629 Polyneuropathy, unspecified: Secondary | ICD-10-CM | POA: Diagnosis not present

## 2018-11-15 MED ORDER — GABAPENTIN 100 MG PO CAPS: ORAL_CAPSULE | ORAL | 0 refills | Status: DC

## 2018-11-15 NOTE — Progress Notes (Signed)
Numeric Pain Rating Scale and Functional Assessment Average Pain 5 Pain Right Now 5 My pain is constant numbness and prickling sensation in hands, stabbing knife sensation in legs. Pain is worse at night, no certain actives sets off pain or increases. Pain improves with tramadol when very severe, uses voltaren on feet.   In the last MONTH (on 0-10 scale) has pain interfered with the following?  1. General activity like being  able to carry out your everyday physical activities such as walking, climbing stairs, carrying groceries, or moving a chair?  Rating(5)  2. Relation with others like being able to carry out your usual social activities and roles such as  activities at home, at work and in your community. Rating(5)  3. Enjoyment of life such that you have  been bothered by emotional problems such as feeling anxious, depressed or irritable?  Rating(5)   1. Daytime she is able to keep moving and doing activities, however at night unable to do anything just has to sit.  2.  She still goes out and is social. Saturday was the worst pain shes had and did not go to church due to pain and being up all night 3.  Extremely difficult to try and quilt with hands, can't feel. Not depressed, just aggravating.

## 2018-11-15 NOTE — Telephone Encounter (Signed)
FYI  I scheduled Bilateral upper extremity nerve study for patient on 11/29/18 per Dr. Ernestina Patches request. Just an FYI in case authorization is required.  Thanks!

## 2018-11-15 NOTE — Telephone Encounter (Signed)
No PA is needed for NCS per pt insurance

## 2018-11-17 ENCOUNTER — Encounter: Payer: Self-pay | Admitting: Hematology and Oncology

## 2018-11-17 ENCOUNTER — Other Ambulatory Visit: Payer: Self-pay

## 2018-11-17 ENCOUNTER — Telehealth: Payer: Self-pay

## 2018-11-17 ENCOUNTER — Inpatient Hospital Stay: Payer: Medicare Other | Attending: Hematology and Oncology

## 2018-11-17 ENCOUNTER — Telehealth: Payer: Self-pay | Admitting: Hematology and Oncology

## 2018-11-17 ENCOUNTER — Inpatient Hospital Stay (HOSPITAL_BASED_OUTPATIENT_CLINIC_OR_DEPARTMENT_OTHER): Payer: Medicare Other | Admitting: Hematology and Oncology

## 2018-11-17 VITALS — BP 137/69 | HR 79 | Temp 98.2°F | Resp 18

## 2018-11-17 VITALS — BP 146/67 | HR 93 | Temp 98.3°F | Resp 18 | Ht 66.0 in | Wt 143.5 lb

## 2018-11-17 DIAGNOSIS — C50211 Malignant neoplasm of upper-inner quadrant of right female breast: Secondary | ICD-10-CM

## 2018-11-17 DIAGNOSIS — D500 Iron deficiency anemia secondary to blood loss (chronic): Secondary | ICD-10-CM | POA: Diagnosis not present

## 2018-11-17 DIAGNOSIS — D509 Iron deficiency anemia, unspecified: Secondary | ICD-10-CM | POA: Diagnosis not present

## 2018-11-17 DIAGNOSIS — Z170 Estrogen receptor positive status [ER+]: Secondary | ICD-10-CM | POA: Diagnosis not present

## 2018-11-17 DIAGNOSIS — E1142 Type 2 diabetes mellitus with diabetic polyneuropathy: Secondary | ICD-10-CM | POA: Diagnosis not present

## 2018-11-17 DIAGNOSIS — C8591 Non-Hodgkin lymphoma, unspecified, lymph nodes of head, face, and neck: Secondary | ICD-10-CM

## 2018-11-17 DIAGNOSIS — D801 Nonfamilial hypogammaglobulinemia: Secondary | ICD-10-CM

## 2018-11-17 MED ORDER — DIPHENHYDRAMINE HCL 25 MG PO CAPS
ORAL_CAPSULE | ORAL | Status: AC
  Filled 2018-11-17: qty 1

## 2018-11-17 MED ORDER — SODIUM CHLORIDE 0.9 % IV SOLN
510.0000 mg | Freq: Once | INTRAVENOUS | Status: AC
  Administered 2018-11-17: 510 mg via INTRAVENOUS
  Filled 2018-11-17: qty 17

## 2018-11-17 MED ORDER — SODIUM CHLORIDE 0.9 % IV SOLN
Freq: Once | INTRAVENOUS | Status: AC
  Administered 2018-11-17: 10:00:00 via INTRAVENOUS
  Filled 2018-11-17: qty 250

## 2018-11-17 MED ORDER — ACETAMINOPHEN 325 MG PO TABS
ORAL_TABLET | ORAL | Status: AC
  Filled 2018-11-17: qty 2

## 2018-11-17 MED ORDER — ACETAMINOPHEN 325 MG PO TABS
650.0000 mg | ORAL_TABLET | Freq: Once | ORAL | Status: AC
  Administered 2018-11-17: 650 mg via ORAL

## 2018-11-17 MED ORDER — DIPHENHYDRAMINE HCL 25 MG PO CAPS
25.0000 mg | ORAL_CAPSULE | Freq: Once | ORAL | Status: AC
  Administered 2018-11-17: 25 mg via ORAL

## 2018-11-17 NOTE — Telephone Encounter (Signed)
Spoke with regarding her appt on 11/20/18. Pt stated she will check her vitals prior to her appt. Pt questions and concerns were address.

## 2018-11-17 NOTE — Telephone Encounter (Signed)
I talk with patient regarding 10/30

## 2018-11-17 NOTE — Progress Notes (Signed)
[facility] OFFICE PROGRESS NOTE  Patient Care Team: Deland Pretty, MD as PCP - General (Internal Medicine) Adrian Prows, MD as Consulting Physician (Cardiology) Heath Lark, MD as Consulting Physician (Hematology and Oncology) Fanny Skates, MD as Consulting Physician (General Surgery) Eppie Gibson, MD as Attending Physician (Radiation Oncology) Sylvan Cheese, NP as Nurse Practitioner (Hematology and Oncology) Thompson Grayer, MD as Consulting Physician (Cardiology) Lahoma Rocker, MD as Consulting Physician (Rheumatology) Terrance Mass, MD (Inactive) as Consulting Physician (Gynecology)  ASSESSMENT & PLAN:  Breast cancer of upper-inner quadrant of right female breast [facility]) She will continue anastrozole She tolerated treatment well without major side effects  Iron deficiency anemia The most likely cause of her anemia is due to chronic blood loss/malabsorption syndrome. We discussed some of the risks, benefits, and alternatives of intravenous iron infusions. The patient is symptomatic from anemia and the iron level is critically low. She tolerated oral iron supplement poorly and desires to achieved higher levels of iron faster for adequate hematopoesis. Some of the side-effects to be expected including risks of infusion reactions, phlebitis, headaches, nausea and fatigue.  The patient is willing to proceed. Patient education material was dispensed.  Goal is to keep ferritin level greater than 50 and normalization of anemia I have reviewed EGD and colonoscopy from 2019 which showed no internal GI source of bleeding   Diabetic peripheral neuropathy ([place]) She has multifactorial neuropathy She is prescribed gabapentin I recommend close follow-up with her primary care doctor   Orders Placed This Encounter  Procedures  . CBC with Differential/Platelet    Standing Status:   Standing    Number of Occurrences:   2    Standing Expiration Date:   11/17/2019  .  Ferritin    Standing Status:   Standing    Number of Occurrences:   2    Standing Expiration Date:   11/17/2019  . Iron and TIBC    Standing Status:   Standing    Number of Occurrences:   2    Standing Expiration Date:   11/17/2019    INTERVAL HISTORY: Please see below for problem oriented charting. She returns for further follow-up and management of iron deficiency She was referred by her primary care doctor due to persistent iron deficiency anemia despite trial of oral iron supplement EGD and colonoscopy from 2019 did not reveal source of bleeding except for mild esophagitis The patient denies any recent signs or symptoms of bleeding such as spontaneous epistaxis, hematuria or hematochezia. 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   01/03/2014 Bone Marrow Biopsy   Accession: [id number] Bone  marrow biopsy is negative   01/04/2014 Imaging   ECHO showed normal EF   01/04/2014 Procedure   She has placement of port   01/09/2014 - 03/07/2014 Chemotherapy   She was given treatment with bendamustine with rituximab. Treatment was  stopped due to severe side-effects despite significant dose adjustment for cycle 2   01/18/2014 - 02/01/2014 Hospital Admission   She was admitted to the hospital from Escherichia coli sepsis with multiorgan failure and brief episodes of intubation. She was discharged to skilled nursing facility   02/26/2014 Imaging   PET/CT scan showed near complete remission   02/27/2014 Adverse Reaction   Cycle 2 of treatment was resumed with drastic dose adjustment to bendamustine due to recent multi-organ failure   04/03/2014 - 03/13/2015 Chemotherapy   She is started on maintenance rituximab only.   04/09/2014 - 04/12/2014 Hospital Admission   The patient was admitted to the hospital with urinary tract infection and sepsis.   06/05/2014 Imaging   PET CT scan showed complete response to Rx   02/12/2015 Imaging   Ct scan showed no evidence of disease. It shows she has new pneumonia   04/02/2015 Miscellaneous   She received 1 dose IVIG complicated by mild infusion reaction   04/26/2015 - 04/30/2015 Hospital Admission   She had recurrent admission to the hospital with sepsis   10/22/2015 Imaging   CT scan of chest and abdomen showed Ill-defined tissue in the porta hepatis suggest treated lymphoma. No evidence of lymphadenopathy in the chest, abdomen & pelvis to suggest recurrent lymphoma.   10/20/2016 Imaging   1. Stable appearance of ill defined soft tissue within the porta hepatis compatible with treated lymphoma. 2. No new findings identified. 3. Aortic Atherosclerosis (ICD10-I70.0). LAD coronary artery calcifications noted.   04/01/2017 Procedure   Status post right IJ port catheter removal   Breast cancer of upper-inner quadrant of right female breast ([place])  11/06/2014 Imaging   DEXA scan showed osteopenia T-1.9 on bilateral femoral neck   03/31/2015 Imaging   Screening mammogram showed suspicious lesion, confirmed on diagnostic imaging at 2 and 230 position on the right breast   04/10/2015 Pathology  Results   Accession: [id number] breast biopsy in 2 locations came back invasive ductal carcinoma with calcification, 100% ER and PR positive, HER 2 neg   04/10/2015 Clinical Stage   Stage IA: T1 N0   05/27/2015 Surgery   Right total mastectomy: multifocal IDC, 1.5 and 1.0 cm, neg margins, ER+ (100% - both); PR+ (100% and 95%), HER2neu negative (ratio 1.69 and 1.14) Ki67 2% and 10%. DCIS   05/27/2015 Pathologic Stage   Stage IA: mpT1c pNx pMx   06/17/2015 Survivorship   Survivorship care plan mailed to patient at her request   07/14/2015 -  Anti-estrogen oral therapy   She started taking Arimidex   04/01/2016 Imaging   No mammographic evidence of malignancy in the left breast.   04/08/2017 Imaging   Screening mammogram in one year. Neg     REVIEW OF SYSTEMS:   Constitutional: Denies fevers, chills or abnormal weight loss Eyes: Denies blurriness of vision Ears, nose, mouth, throat, and face: Denies mucositis or sore throat Respiratory: Denies cough, dyspnea or wheezes Cardiovascular: Denies palpitation, chest discomfort or lower extremity swelling Gastrointestinal:  Denies nausea, heartburn or change in bowel habits Skin: Denies abnormal skin rashes Lymphatics: Denies new lymphadenopathy or easy bruising Neurological:Denies numbness, tingling or new weaknesses Behavioral/Psych: Mood is stable, no new changes  All other systems were reviewed with  the patient and are negative.  I have reviewed the past medical history, past surgical history, social history and family history with the patient and they are unchanged from previous note.  ALLERGIES:  is allergic to morphine and related; multaq [dronedarone]; demerol; flecainide; oysters [shellfish allergy]; penicillins; and sulfa drugs cross reactors.  MEDICATIONS:  Current Outpatient Medications  Medication Sig Dispense Refill  . albuterol (PROVENTIL HFA;VENTOLIN HFA) 108 (90 Base) MCG/ACT inhaler Inhale 1-2 puffs into the lungs every 6  (six) hours as needed for wheezing or shortness of breath.     . anastrozole (ARIMIDEX) 1 MG tablet Take 1 tablet (1 mg total) by mouth daily. 90 tablet 3  . Cholecalciferol (VITAMIN D) 2000 UNITS tablet Take 2,000 Units by mouth daily with lunch.    . Cyanocobalamin (B-12) 2500 MCG TABS Take 2,500 mcg by mouth daily with lunch.     . diltiazem (CARDIZEM CD) 120 MG 24 hr capsule Take 1 capsule (120 mg total) by mouth daily. 90 capsule 1  . Fluticasone-Salmeterol (ADVAIR) 250-50 MCG/DOSE AEPB Inhale 1 puff into the lungs every 12 (twelve) hours.     . gabapentin (NEURONTIN) 100 MG capsule Take 1 capsule in the morning and increase weekly by 1 capsule until taking 3 capsules in the morning 90 capsule 0  . gabapentin (NEURONTIN) 300 MG capsule Take 600 mg by mouth at bedtime.     Marland Kitchen levothyroxine (SYNTHROID, LEVOTHROID) 200 MCG tablet Take 200 mcg by mouth daily before breakfast.    . Magnesium Oxide 500 MG TABS Take 500 mg by mouth 2 (two) times daily.     . metFORMIN (GLUCOPHAGE-XR) 500 MG 24 hr tablet Take 500 mg by mouth 2 (two) times daily with a meal.    . montelukast (SINGULAIR) 10 MG tablet Take 10 mg by mouth daily.    Marland Kitchen omega-3 acid ethyl esters (LOVAZA) 1 g capsule Take 1 g by mouth 2 (two) times daily.    . pravastatin (PRAVACHOL) 40 MG tablet Take 20 mg by mouth See admin instructions. Take 0.5 tablet (20 mg) by mouth on Mondays through Thursdays.  0  . PROLIA 60 MG/ML SOSY injection Inject 60 mg into the vein every 6 (six) months.    . rivaroxaban (XARELTO) 20 MG TABS tablet Take 20 mg by mouth daily with supper.    . traMADol-acetaminophen (ULTRACET) 37.5-325 MG tablet Take 1 tablet by mouth every 6 (six) hours as needed (pain).    . triamterene-hydrochlorothiazide (MAXZIDE-25) 37.5-25 MG tablet Take 1 tablet by mouth daily as needed (for fluid retention/swelling.).      No current facility-administered medications for this visit.     PHYSICAL EXAMINATION: ECOG PERFORMANCE STATUS: 1  - Symptomatic but completely ambulatory  Vitals:   11/17/18 0916  BP: (!) 146/67  Pulse: 93  Resp: 18  Temp: 98.3 F (36.8 C)  SpO2: 100%   Filed Weights   11/17/18 0916  Weight: 143 lb 8 oz (65.1 kg)    GENERAL:alert, no distress and comfortable Musculoskeletal:no cyanosis of digits and no clubbing  NEURO: alert & oriented x 3 with fluent speech, no focal motor/sensory deficits  LABORATORY DATA:  I have reviewed the data as listed    Component Value Date/Time   NA 140 09/21/2018 0930   NA 141 03/15/2018 1258   NA 140 01/27/2017 0811   K 4.1 09/21/2018 0930   K 3.9 01/27/2017 0811   CL 105 09/21/2018 0930   CO2 24 09/21/2018 0930   CO2  25 01/27/2017 0811   GLUCOSE 188 (H) 09/21/2018 0930   GLUCOSE 122 01/27/2017 0811   BUN 18 09/21/2018 0930   BUN 9 03/15/2018 1258   BUN 16.9 01/27/2017 0811   CREATININE 0.93 09/21/2018 0930   CREATININE 0.70 03/23/2018 1000   CREATININE 0.8 01/27/2017 0811   CALCIUM 9.5 09/21/2018 0930   CALCIUM 9.9 01/27/2017 0811   PROT 6.3 (L) 09/21/2018 0930   PROT 6.9 01/27/2017 0811   ALBUMIN 4.1 09/21/2018 0930   ALBUMIN 4.4 01/27/2017 0811   AST 18 09/21/2018 0930   AST 16 03/23/2018 1000   AST 15 01/27/2017 0811   ALT 17 09/21/2018 0930   ALT 13 03/23/2018 1000   ALT 15 01/27/2017 0811   ALKPHOS 78 09/21/2018 0930   ALKPHOS 77 01/27/2017 0811   BILITOT 0.5 09/21/2018 0930   BILITOT 0.5 03/23/2018 1000   BILITOT 0.65 01/27/2017 0811   GFRNONAA 58 (L) 09/21/2018 0930   GFRNONAA >60 03/23/2018 1000   GFRAA >60 09/21/2018 0930   GFRAA >60 03/23/2018 1000    No results found for: SPEP, UPEP  Lab Results  Component Value Date   WBC 6.1 09/21/2018   NEUTROABS 4.2 09/21/2018   HGB 10.5 (L) 09/21/2018   HCT 32.0 (L) 09/21/2018   MCV 91.7 09/21/2018   PLT 194 09/21/2018      Chemistry      Component Value Date/Time   NA 140 09/21/2018 0930   NA 141 03/15/2018 1258   NA 140 01/27/2017 0811   K 4.1 09/21/2018 0930   K  3.9 01/27/2017 0811   CL 105 09/21/2018 0930   CO2 24 09/21/2018 0930   CO2 25 01/27/2017 0811   BUN 18 09/21/2018 0930   BUN 9 03/15/2018 1258   BUN 16.9 01/27/2017 0811   CREATININE 0.93 09/21/2018 0930   CREATININE 0.70 03/23/2018 1000   CREATININE 0.8 01/27/2017 0811      Component Value Date/Time   CALCIUM 9.5 09/21/2018 0930   CALCIUM 9.9 01/27/2017 0811   ALKPHOS 78 09/21/2018 0930   ALKPHOS 77 01/27/2017 0811   AST 18 09/21/2018 0930   AST 16 03/23/2018 1000   AST 15 01/27/2017 0811   ALT 17 09/21/2018 0930   ALT 13 03/23/2018 1000   ALT 15 01/27/2017 0811   BILITOT 0.5 09/21/2018 0930   BILITOT 0.5 03/23/2018 1000   BILITOT 0.65 01/27/2017 0811       All questions were answered. The patient knows to call the clinic with any problems, questions or concerns. No barriers to learning was detected.  I spent 15 minutes counseling the patient face to face. The total time spent in the appointment was 20 minutes and more than 50% was on counseling and review of test results  Heath Lark, MD 11/17/2018 9:51 AM

## 2018-11-17 NOTE — Patient Instructions (Signed)

## 2018-11-17 NOTE — Assessment & Plan Note (Signed)
She has multifactorial neuropathy She is prescribed gabapentin I recommend close follow-up with her primary care doctor

## 2018-11-17 NOTE — Assessment & Plan Note (Signed)
The most likely cause of her anemia is due to chronic blood loss/malabsorption syndrome. We discussed some of the risks, benefits, and alternatives of intravenous iron infusions. The patient is symptomatic from anemia and the iron level is critically low. She tolerated oral iron supplement poorly and desires to achieved higher levels of iron faster for adequate hematopoesis. Some of the side-effects to be expected including risks of infusion reactions, phlebitis, headaches, nausea and fatigue.  The patient is willing to proceed. Patient education material was dispensed.  Goal is to keep ferritin level greater than 50 and normalization of anemia I have reviewed EGD and colonoscopy from 2019 which showed no internal GI source of bleeding

## 2018-11-17 NOTE — Assessment & Plan Note (Signed)
She will continue anastrozole She tolerated treatment well without major side effects

## 2018-11-20 ENCOUNTER — Telehealth (INDEPENDENT_AMBULATORY_CARE_PROVIDER_SITE_OTHER): Payer: Medicare Other | Admitting: Internal Medicine

## 2018-11-20 ENCOUNTER — Encounter: Payer: Self-pay | Admitting: Internal Medicine

## 2018-11-20 ENCOUNTER — Other Ambulatory Visit: Payer: Self-pay | Admitting: Physical Medicine and Rehabilitation

## 2018-11-20 VITALS — Ht 66.0 in | Wt 138.0 lb

## 2018-11-20 DIAGNOSIS — I10 Essential (primary) hypertension: Secondary | ICD-10-CM

## 2018-11-20 DIAGNOSIS — I484 Atypical atrial flutter: Secondary | ICD-10-CM | POA: Diagnosis not present

## 2018-11-20 DIAGNOSIS — I480 Paroxysmal atrial fibrillation: Secondary | ICD-10-CM

## 2018-11-20 NOTE — Telephone Encounter (Signed)
Please advise 

## 2018-11-20 NOTE — Progress Notes (Signed)
Electrophysiology TeleHealth Note   Due to national recommendations of social distancing due to COVID 19, an audio/video telehealth visit is felt to be most appropriate for this patient at this time.  See MyChart message from today for the patient's consent to telehealth for [facility].   Date:  11/20/2018   ID:  Patty [name], DOB 19-Nov-1940, MRN [id number]  Location: patient's home  Provider location:  [facility] [place]  Evaluation Performed: Follow-up visit  PCP:  Deland Pretty, MD   Electrophysiologist:  Dr Rayann Heman  Chief Complaint:  palpitations  History of Present Illness:    Patty [name] is a 78 y.o. female who presents via telehealth conferencing today.  Since last being seen in our clinic, the patient reports doing very well.  Today, she denies symptoms of palpitations, chest pain, shortness of breath,  lower extremity edema, dizziness, presyncope, or syncope.  The patient is otherwise without complaint today.  The patient denies symptoms of fevers, chills, cough, or new SOB worrisome for COVID 19.  Past Medical History:  Diagnosis Date  . Acute on chronic combined systolic and diastolic CHF (congestive heart failure) ([place])   . Arthritis   . Asthma   . Atrial flutter (HCC)    PAROXYSMAL, EKG 10/06/2017, ATRIAL FLUTTER (ATYPICAL) WITH VARIABLE VENTRICULAR RESPONCE AND CONTROLLED  . Atrial flutter, paroxysmal ([place]) 06/02/2018  . Breast cancer, right ([place]) 2/17   R sided, s/p total mastectomy  . Bulging lumbar disc    3  . Chronic kidney disease    recurring UTI  . Complication of anesthesia    "morphine made me stop breathing"  . Concentric left ventricular hypertrophy    MILD DECREASE IN GLOBAL WALL MOTION. DOPPLER EVIDENCE OF GRADE II (PSEUDONORMAL) DIASTOLIC DYSFUNCTION, ELEVATED LAP, CALCULATED EF 45%, LEFT ATRIAL CAVITY IS MILDLY DILATED. MILD TO MODERATE AORTIC REGURGITATION. MILD (GRADE I) MITRAL REGURGITATION. MILD TRICUSPID REGURGITATION. ESTIMATED  PULMONARY ARTERY SYSTOLIC PRESSURE 22 MMHG.  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 First degree AV block   . HCAP (healthcare-associated pneumonia) 12/2013   Archie Endo 01/18/2014  . History of cardioversion 06/02/2018  . History of skin cancer   . History of transfusion   . Hypertension   . Hypothyroidism   . Left atrial enlargement   . Leg swelling   . 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 Malaise and fatigue   . Nocturia   . Nonischemic cardiomyopathy ([place]) 06/02/2018  . Paroxysmal atrial fibrillation (HCC)   . Persistent atrial fibrillation   . Pure hypercholesterolemia   . Shortness of breath dyspnea    "at times"  . Skin cancer   . Sleep apnea    uses c-pap  . Type II diabetes mellitus ([place])     Past Surgical History:  Procedure Laterality Date  . ABDOMINAL HYSTERECTOMY  1972   partial  . ABDOMINAL HYSTERECTOMY    . APPENDECTOMY  1953  . ATRIAL FIBRILLATION ABLATION  08/19/2015   PVI with CTI ablation by Dr Rayann Heman  . ATRIAL FIBRILLATION ABLATION N/A 04/14/2018   Procedure: ATRIAL FIBRILLATION ABLATION;  Surgeon: Thompson Grayer, MD;  Location: [facility];  Service: Cardiovascular;  Laterality: N/A;  . BACK SURGERY     x2  . BIOPSY  02/13/2018   Procedure: BIOPSY;  Surgeon: Ronnette Juniper, MD;  Location: [facility];  Service: Gastroenterology;;  X2=EGD & Colon  . BREAST SURGERY  Right mastectomy  . CARDIAC CATHETERIZATION  10/10/2008   Nonobstructive CAD  . CARDIOVERSION  04/13/2011   Procedure: CARDIOVERSION;  Surgeon: Laverda Page, MD;  Location: [place];  Service: Cardiovascular;  Laterality: N/A;  . CARDIOVERSION N/A 06/18/2015   Procedure: CARDIOVERSION;  Surgeon: Adrian Prows, MD;  Location: [facility];  Service: Cardiovascular;  Laterality: N/A;  . CARDIOVERSION N/A 10/26/2016   Procedure: CARDIOVERSION;  Surgeon: Adrian Prows, MD;  Location: [place];  Service: Cardiovascular;   Laterality: N/A;  . CARDIOVERSION N/A 10/11/2017   Procedure: CARDIOVERSION;  Surgeon: Nigel Mormon, MD;  Location: [facility];  Service: Cardiovascular;  Laterality: N/A;  . CARDIOVERSION N/A 05/23/2018   Procedure: CARDIOVERSION;  Surgeon: Adrian Prows, MD;  Location: [place];  Service: Cardiovascular;  Laterality: N/A;  . CATARACT EXTRACTION W/ INTRAOCULAR LENS  IMPLANT, BILATERAL Bilateral   . CHOLECYSTECTOMY  1993  . COLONOSCOPY N/A 02/13/2018   Procedure: COLONOSCOPY;  Surgeon: Ronnette Juniper, MD;  Location: [facility];  Service: Gastroenterology;  Laterality: N/A;  . ELECTROPHYSIOLOGIC STUDY N/A 08/19/2015   Procedure: Atrial Fibrillation Ablation;  Surgeon: Thompson Grayer, MD;  Location: [place] [facility];  Service: Cardiovascular;  Laterality: N/A;  . ESOPHAGOGASTRODUODENOSCOPY N/A 02/13/2018   Procedure: ESOPHAGOGASTRODUODENOSCOPY (EGD);  Surgeon: Ronnette Juniper, MD;  Location: [facility];  Service: Gastroenterology;  Laterality: N/A;  . FOOT SURGERY    . IR REMOVAL TUN ACCESS W/ PORT W/O FL MOD SED  04/01/2017  . LUMBAR LAMINECTOMY    . MASS BIOPSY Left 12/07/2013   Procedure: EXCISIONAL BIOPSY LEFT NECK MASS ;  Surgeon: Jerrell Belfast, MD;  Location: [place];  Service: ENT;  Laterality: Left;  Marland Kitchen MASTECTOMY Right 2017   malignant  . POLYPECTOMY  02/13/2018   Procedure: POLYPECTOMY;  Surgeon: Ronnette Juniper, MD;  Location: [facility];  Service: Gastroenterology;;  EGD  . PORTACATH PLACEMENT  12/2013   Archie Endo 01/04/2014  . PUBOVAGINAL SLING    . SKIN TAG REMOVAL  2012   leg  . TEE WITHOUT CARDIOVERSION N/A 08/19/2015   Procedure: TRANSESOPHAGEAL ECHOCARDIOGRAM (TEE);  Surgeon: Larey [facility]er, MD;  Location: [place];  Service: Cardiovascular;  Laterality: N/A;  . TONSILLECTOMY    . TOTAL MASTECTOMY Right 05/27/2015   Procedure: RIGHT TOTAL MASTECTOMY;  Surgeon: Fanny Skates, MD;  Location: [facility];  Service: General;  Laterality: Right;    Current Outpatient  Medications  Medication Sig Dispense Refill  . albuterol (PROVENTIL HFA;VENTOLIN HFA) 108 (90 Base) MCG/ACT inhaler Inhale 1-2 puffs into the lungs every 6 (six) hours as needed for wheezing or shortness of breath.     . anastrozole (ARIMIDEX) 1 MG tablet Take 1 tablet (1 mg total) by mouth daily. 90 tablet 3  . Cholecalciferol (VITAMIN D) 2000 UNITS tablet Take 2,000 Units by mouth daily with lunch.    . Cyanocobalamin (B-12) 2500 MCG TABS Take 2,500 mcg by mouth daily with lunch.     . diltiazem (CARDIZEM CD) 120 MG 24 hr capsule Take 1 capsule (120 mg total) by mouth daily. 90 capsule 1  . Fluticasone-Salmeterol (ADVAIR) 250-50 MCG/DOSE AEPB Inhale 1 puff into the lungs every 12 (twelve) hours.     . gabapentin (NEURONTIN) 100 MG capsule Take 1 capsule in the morning and increase weekly by 1 capsule until taking 3 capsules in the morning 90 capsule 0  . gabapentin (NEURONTIN) 300 MG capsule Take 600 mg by mouth at bedtime.     Marland Kitchen levothyroxine (SYNTHROID, LEVOTHROID) 200 MCG tablet Take  200 mcg by mouth daily before breakfast.    . Magnesium Oxide 500 MG TABS Take 500 mg by mouth 2 (two) times daily.     . metFORMIN (GLUCOPHAGE-XR) 500 MG 24 hr tablet Take 500 mg by mouth 2 (two) times daily with a meal.    . montelukast (SINGULAIR) 10 MG tablet Take 10 mg by mouth daily.    Marland Kitchen omega-3 acid ethyl esters (LOVAZA) 1 g capsule Take 1 g by mouth 2 (two) times daily.    Marland Kitchen PROLIA 60 MG/ML SOSY injection Inject 60 mg into the vein every 6 (six) months.    . rivaroxaban (XARELTO) 20 MG TABS tablet Take 20 mg by mouth daily with supper.    . traMADol-acetaminophen (ULTRACET) 37.5-325 MG tablet Take 1 tablet by mouth every 6 (six) hours as needed (pain).    . triamterene-hydrochlorothiazide (MAXZIDE-25) 37.5-25 MG tablet Take 1 tablet by mouth daily as needed (for fluid retention/swelling.).      No current facility-administered medications for this visit.     Allergies:   Morphine and related, Multaq  [dronedarone], Demerol, Flecainide, Oysters [shellfish allergy], Penicillins, and Sulfa drugs cross reactors   Social History:  The patient  reports that she has never smoked. She has never used smokeless tobacco. She reports that she does not drink alcohol or use drugs.   Family History:  The patient's family history includes Arthritis in her mother; Breast cancer (age of onset: 53) in her mother; Cancer (age of onset: 75) in her sister; Diabetes in her sister; Heart attack in her brother; Heart attack (age of onset: 59) in her father; Heart disease in her mother; Heart failure in her mother; Hypertension in her brother and father; Stroke (age of onset: 55) in her father.   ROS:  Please see the history of present illness.   All other systems are personally reviewed and negative.    Exam:    Vital Signs:  Ht 5\' 6"  (1.676 m)   Wt 138 lb (62.6 kg)   BMI 22.27 kg/m  checks her BP frequently.  This past Friday, BP 120/70  Well sounding and appearing, alert and conversant, regular work of breathing,  good skin color Eyes- anicteric, neuro- grossly intact, skin- no apparent rash or lesions or cyanosis, mouth- oral mucosa is pink  Labs/Other Tests and Data Reviewed:    Recent Labs: 09/21/2018: ALT 17; BUN 18; Creatinine, Ser 0.93; Hemoglobin 10.5; Platelets 194; Potassium 4.1; Sodium 140   Wt Readings from Last 3 Encounters:  11/20/18 138 lb (62.6 kg)  11/17/18 143 lb 8 oz (65.1 kg)  09/21/18 141 lb 3.2 oz (64 kg)      ASSESSMENT & PLAN:    1.  Atrial fibrillation/ atrial flutter Doing very well post ablation off AAD  On xarelto No changes  2. Nonischemic CM (EF 45%) Followed by Dr Einar Gip  3. HTN Stable No change required today   Follow-up:  12 months Follow-up with Dr Einar Gip as scheduled   Patient Risk:  after full review of this patients clinical status, I feel that they are at moderate risk at this time.  Today, I have spent 15 minutes with the patient with telehealth  technology discussing arrhythmia management .    Army Fossa, MD  11/20/2018 10:41 AM     [facility] [address] [place] [address] [phone] (office) [phone] (fax)

## 2018-11-21 ENCOUNTER — Other Ambulatory Visit: Payer: Self-pay

## 2018-11-21 MED ORDER — RIVAROXABAN 20 MG PO TABS: 20.0000 mg | ORAL_TABLET | Freq: Every day | ORAL | 0 refills | Status: DC

## 2018-11-24 ENCOUNTER — Other Ambulatory Visit: Payer: Self-pay

## 2018-11-24 ENCOUNTER — Inpatient Hospital Stay: Payer: Medicare Other

## 2018-11-24 VITALS — BP 121/71 | HR 66 | Resp 18

## 2018-11-24 DIAGNOSIS — D801 Nonfamilial hypogammaglobulinemia: Secondary | ICD-10-CM

## 2018-11-24 DIAGNOSIS — D500 Iron deficiency anemia secondary to blood loss (chronic): Secondary | ICD-10-CM

## 2018-11-24 DIAGNOSIS — I4892 Unspecified atrial flutter: Secondary | ICD-10-CM

## 2018-11-24 DIAGNOSIS — D509 Iron deficiency anemia, unspecified: Secondary | ICD-10-CM | POA: Diagnosis not present

## 2018-11-24 DIAGNOSIS — C8591 Non-Hodgkin lymphoma, unspecified, lymph nodes of head, face, and neck: Secondary | ICD-10-CM

## 2018-11-24 MED ORDER — SODIUM CHLORIDE 0.9 % IV SOLN
510.0000 mg | Freq: Once | INTRAVENOUS | Status: AC
  Administered 2018-11-24: 14:00:00 510 mg via INTRAVENOUS
  Filled 2018-11-24: qty 17

## 2018-11-24 MED ORDER — DIPHENHYDRAMINE HCL 25 MG PO CAPS
ORAL_CAPSULE | ORAL | Status: AC
  Filled 2018-11-24: qty 1

## 2018-11-24 MED ORDER — ACETAMINOPHEN 325 MG PO TABS
650.0000 mg | ORAL_TABLET | Freq: Once | ORAL | Status: AC
  Administered 2018-11-24: 14:00:00 650 mg via ORAL

## 2018-11-24 MED ORDER — DIPHENHYDRAMINE HCL 25 MG PO CAPS
25.0000 mg | ORAL_CAPSULE | Freq: Once | ORAL | Status: AC
  Administered 2018-11-24: 25 mg via ORAL

## 2018-11-24 MED ORDER — ACETAMINOPHEN 325 MG PO TABS
ORAL_TABLET | ORAL | Status: AC
  Filled 2018-11-24: qty 2

## 2018-11-24 MED ORDER — SODIUM CHLORIDE 0.9 % IV SOLN
INTRAVENOUS | Status: DC
  Administered 2018-11-24: 14:00:00 via INTRAVENOUS
  Filled 2018-11-24: qty 250

## 2018-11-24 MED ORDER — SODIUM CHLORIDE 0.9 % IV SOLN
Freq: Once | INTRAVENOUS | Status: AC
  Administered 2018-11-24: 14:00:00 via INTRAVENOUS
  Filled 2018-11-24: qty 250

## 2018-11-24 NOTE — Patient Instructions (Signed)

## 2018-11-29 ENCOUNTER — Ambulatory Visit (INDEPENDENT_AMBULATORY_CARE_PROVIDER_SITE_OTHER): Payer: Medicare Other | Admitting: Physical Medicine and Rehabilitation

## 2018-11-29 ENCOUNTER — Encounter: Payer: Self-pay | Admitting: Physical Medicine and Rehabilitation

## 2018-11-29 DIAGNOSIS — R202 Paresthesia of skin: Secondary | ICD-10-CM | POA: Diagnosis not present

## 2018-11-29 NOTE — Progress Notes (Signed)
.  Numeric Pain Rating Scale and Functional Assessment Average Pain 5   In the last MONTH (on 0-10 scale) has pain interfered with the following?  1. General activity like being  able to carry out your everyday physical activities such as walking, climbing stairs, carrying groceries, or moving a chair?  Rating(5)     

## 2018-11-30 NOTE — Procedures (Signed)
EMG & NCV Findings: Evaluation of the left median motor nerve showed reduced amplitude (4.9 mV) and decreased conduction velocity (Elbow-Wrist, 46 m/s).  The right median motor nerve showed prolonged distal onset latency (4.5 ms) and decreased conduction velocity (Elbow-Wrist, 47 m/s).  The left ulnar motor nerve showed decreased conduction velocity (B Elbow-Wrist, 51 m/s) and decreased conduction velocity (A Elbow-B Elbow, 50 m/s).  The left median (across palm) sensory nerve showed prolonged distal peak latency (Wrist, 4.6 ms).  The right median (across palm) sensory nerve showed prolonged distal peak latency (Wrist, 5.1 ms), reduced amplitude (7.1 V), and prolonged distal peak latency (Palm, 2.3 ms).  The left ulnar sensory nerve showed prolonged distal peak latency (3.9 ms), reduced amplitude (12.9 V), and decreased conduction velocity (Wrist-5th Digit, 36 m/s).  All remaining nerves (as indicated in the following tables) were within normal limits.  Left vs. Right side comparison data for the ulnar motor nerve indicates abnormal L-R latency difference (1.1 ms) and abnormal L-R velocity difference (A Elbow-B Elbow, 25 m/s).  All remaining left vs. right side differences were within normal limits.    All examined muscles (as indicated in the following table) showed no evidence of electrical instability.    Impression: The above electrodiagnostic study is ABNORMAL and reveals evidence of:  1. 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   2. A mild to moderate left median nerve entrapment at the wrist (carpal tunnel syndrome) affecting sensory components.  There is no significant electrodiagnostic evidence of any other focal nerve entrapment, brachial plexopathy, cervical radiculopathy or generalized peripheral neuropathy.   Recommendations: 1.  Follow-up with referring physician. 2.  Continue current management of  symptoms. 3.  Continue use of resting splint at night-time and as needed during the day. 4.  Suggest surgical evaluation versus referral to Dr. Eunice Blase in our practice for ultrasound-guided [facility] dissection.  ___________________________ Laurence Spates FAAPMR Board Certified, American Board of Physical Medicine and Rehabilitation    Nerve Conduction Studies Anti Sensory Summary Table   Stim Site NR Peak (ms) Norm Peak (ms) P-T Amp (V) Norm P-T Amp Site1 Site2 Delta-P (ms) Dist (cm) Vel (m/s) Norm Vel (m/s)  Left Median Acr Palm Anti Sensory (2nd Digit)  32.2C  Wrist    *4.6 <3.6 11.0 >10 Wrist Palm 3.1 0.0    Palm    1.5 <2.0 1.5         Right Median Acr Palm Anti Sensory (2nd Digit)  32.1C  Wrist    *5.1 <3.6 *7.1 >10 Wrist Palm 2.8 0.0    Palm    *2.3 <2.0 5.3         Left Radial Anti Sensory (Base 1st Digit)  32.3C  Wrist    2.7 <3.1 10.9  Wrist Base 1st Digit 2.7 0.0    Right Radial Anti Sensory (Base 1st Digit)  31C  Wrist    2.9 <3.1 17.3  Wrist Base 1st Digit 2.9 0.0    Left Ulnar Anti Sensory (5th Digit)  32.7C  Wrist    *3.9 <3.7 *12.9 >15.0 Wrist 5th Digit 3.9 14.0 *36 >38  Right Ulnar Anti Sensory (5th Digit)  32.1C  Wrist    3.7 <3.7 17.5 >15.0 Wrist 5th Digit 3.7 14.0 38 >38   Motor Summary Table   Stim Site NR Onset (ms) Norm Onset (ms) O-P Amp (mV) Norm O-P Amp Site1 Site2 Delta-0 (ms) Dist (cm) Vel (m/s)  Norm Vel (m/s)  Left Median Motor (Abd Poll Brev)  32.4C  Wrist    4.0 <4.2 *4.9 >5 Elbow Wrist 4.8 22.0 *46 >50  Elbow    8.8  3.5         Right Median Motor (Abd Poll Brev)  31.5C  Wrist    *4.5 <4.2 5.3 >5 Elbow Wrist 4.7 22.0 *47 >50  Elbow    9.2  4.9         Left Ulnar Motor (Abd Dig Min)  32.5C  Wrist    3.1 <4.2 6.8 >3 B Elbow Wrist 3.7 19.0 *51 >53  B Elbow    6.8  3.9  A Elbow B Elbow 1.8 9.0 *50 >53  A Elbow    8.6  4.4         Right Ulnar Motor (Abd Dig Min)  31.4C  Wrist    4.2 <4.2 7.8 >3 B Elbow Wrist 3.6 19.0 53 >53  B Elbow     7.8  5.2  A Elbow B Elbow 1.2 9.0 75 >53  A Elbow    9.0  5.4          EMG   Side Muscle Nerve Root Ins Act Fibs Psw Amp Dur Poly Recrt Int Fraser Din Comment  Right Abd Poll Brev Median C8-T1 Nml Nml Nml Nml Nml 0 Nml Nml   Right 1stDorInt Ulnar C8-T1 Nml Nml Nml Nml Nml 0 Nml Nml   Right PronatorTeres Median C6-7 Nml Nml Nml Nml Nml 0 Nml Nml   Right Biceps Musculocut C5-6 Nml Nml Nml Nml Nml 0 Nml Nml   Right Deltoid Axillary C5-6 Nml Nml Nml Nml Nml 0 Nml Nml     Nerve Conduction Studies Anti Sensory Left/Right Comparison   Stim Site L Lat (ms) R Lat (ms) L-R Lat (ms) L Amp (V) R Amp (V) L-R Amp (%) Site1 Site2 L Vel (m/s) R Vel (m/s) L-R Vel (m/s)  Median Acr Palm Anti Sensory (2nd Digit)  32.2C  Wrist *4.6 *5.1 0.5 11.0 *7.1 35.5 Wrist Palm     Palm 1.5 *2.3 0.8 1.5 5.3 71.7       Radial Anti Sensory (Base 1st Digit)  32.3C  Wrist 2.7 2.9 0.2 10.9 17.3 37.0 Wrist Base 1st Digit     Ulnar Anti Sensory (5th Digit)  32.7C  Wrist *3.9 3.7 0.2 *12.9 17.5 26.3 Wrist 5th Digit *36 38 2   Motor Left/Right Comparison   Stim Site L Lat (ms) R Lat (ms) L-R Lat (ms) L Amp (mV) R Amp (mV) L-R Amp (%) Site1 Site2 L Vel (m/s) R Vel (m/s) L-R Vel (m/s)  Median Motor (Abd Poll Brev)  32.4C  Wrist 4.0 *4.5 0.5 *4.9 5.3 7.5 Elbow Wrist *46 *47 1  Elbow 8.8 9.2 0.4 3.5 4.9 28.6       Ulnar Motor (Abd Dig Min)  32.5C  Wrist 3.1 4.2 *1.1 6.8 7.8 12.8 B Elbow Wrist *51 53 2  B Elbow 6.8 7.8 1.0 3.9 5.2 25.0 A Elbow B Elbow *50 75 *25  A Elbow 8.6 9.0 0.4 4.4 5.4 18.5          Waveforms:

## 2018-11-30 NOTE — Progress Notes (Signed)
Patty [name] - 78 y.o. female MRN [id number]  Date of birth: [birth year]  Office Visit Note: Visit Date: 11/29/2018 PCP: Deland Pretty, MD Referred by: Deland Pretty, MD  Subjective: Chief Complaint  Patient presents with   Right Hand - Numbness, Tingling   Left Hand - Numbness, Tingling   HPI:  Patty [name] is a 78 y.o. female who comes in today For planned electrodiagnostic study of both upper limbs.  Patient is right-hand dominant.  Please see our prior note for further details and justification this is was a referral from an office visit.  She complains of numbness and tingling in both hands which is been progressively worsening.  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  When she came in she was having a lot of issues in the feet she has seen a neurologist who did diagnosis with neuropathy but has not looked at her hands.  She has some level of neurogenic bladder as well.  It is felt like her hands were probably more of a carpal tunnel syndrome at least in my opinion which is why were doing the test today.  She has followed up with her recent visit to hematology and oncology because of needing iron infusions.  Her hematologist reported that usually the postchemotherapy that she had if it caused a neuropathy in the hands would be temporary.  In terms of her in terms of her treatment for the polyneuropathy she has obtained alpha lipoic acid and she has increased her gabapentin to 200 mg in the morning and this is slowly being titrated up.  She will continue with this and I will see her back in 4 weeks.  ROS Otherwise per HPI.  Assessment & Plan: Visit Diagnoses:  1. Paresthesia of skin     Plan: Impression: The above electrodiagnostic study is ABNORMAL and reveals evidence of:  1. 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   2. A mild to moderate left median nerve entrapment at the wrist (carpal tunnel syndrome) affecting sensory components.  There is no significant electrodiagnostic evidence of any other focal nerve entrapment, brachial plexopathy, cervical radiculopathy or generalized peripheral neuropathy.   Recommendations: 1.  Follow-up with referring physician. 2.  Continue current management of symptoms. 3.  Continue use of resting splint at night-time and as needed during the day. 4.  Suggest surgical evaluation versus referral to Dr. Eunice Blase in our practice for ultrasound-guided [facility] dissection.  Meds & Orders: No orders of the defined types were placed in this encounter.   Orders Placed This Encounter  Procedures   NCV with EMG (electromyography)    Follow-up: Return in about 4 weeks (around 12/27/2018) for Also made appointment for consultation with Dr. Eunice Blase.   Procedures: No procedures performed  EMG & NCV Findings: Evaluation of the left median motor nerve showed reduced amplitude (4.9 mV) and decreased conduction velocity (Elbow-Wrist, 46 m/s).  The right median motor nerve showed prolonged distal onset latency (4.5 ms) and decreased conduction velocity (Elbow-Wrist, 47 m/s).  The left ulnar motor nerve showed decreased conduction velocity (B Elbow-Wrist, 51 m/s) and decreased conduction velocity (A Elbow-B Elbow, 50 m/s).  The left median (across palm) sensory nerve showed prolonged distal peak latency (Wrist, 4.6 ms).  The right median (across palm) sensory nerve showed prolonged distal peak latency (Wrist, 5.1 ms), reduced amplitude (  7.1 V), and prolonged distal peak latency (Palm, 2.3 ms).  The left ulnar sensory nerve showed prolonged distal peak latency (3.9 ms), reduced amplitude (12.9 V), and decreased conduction velocity (Wrist-5th Digit, 36 m/s).  All remaining nerves (as indicated in the following tables) were within normal limits.  Left vs. Right side  comparison data for the ulnar motor nerve indicates abnormal L-R latency difference (1.1 ms) and abnormal L-R velocity difference (A Elbow-B Elbow, 25 m/s).  All remaining left vs. right side differences were within normal limits.    All examined muscles (as indicated in the following table) showed no evidence of electrical instability.    Impression: The above electrodiagnostic study is ABNORMAL and reveals evidence of:  1. 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   2. A mild to moderate left median nerve entrapment at the wrist (carpal tunnel syndrome) affecting sensory components.  There is no significant electrodiagnostic evidence of any other focal nerve entrapment, brachial plexopathy, cervical radiculopathy or generalized peripheral neuropathy.   Recommendations: 1.  Follow-up with referring physician. 2.  Continue current management of symptoms. 3.  Continue use of resting splint at night-time and as needed during the day. 4.  Suggest surgical evaluation versus referral to Dr. Eunice Blase in our practice for ultrasound-guided [facility] dissection.  ___________________________ Laurence Spates FAAPMR Board Certified, American Board of Physical Medicine and Rehabilitation    Nerve Conduction Studies Anti Sensory Summary Table   Stim Site NR Peak (ms) Norm Peak (ms) P-T Amp (V) Norm P-T Amp Site1 Site2 Delta-P (ms) Dist (cm) Vel (m/s) Norm Vel (m/s)  Left Median Acr Palm Anti Sensory (2nd Digit)  32.2C  Wrist    *4.6 <3.6 11.0 >10 Wrist Palm 3.1 0.0    Palm    1.5 <2.0 1.5         Right Median Acr Palm Anti Sensory (2nd Digit)  32.1C  Wrist    *5.1 <3.6 *7.1 >10 Wrist Palm 2.8 0.0    Palm    *2.3 <2.0 5.3         Left Radial Anti Sensory (Base 1st Digit)  32.3C  Wrist    2.7 <3.1 10.9  Wrist Base 1st Digit 2.7 0.0    Right Radial Anti Sensory (Base 1st Digit)  31C  Wrist    2.9 <3.1 17.3   Wrist Base 1st Digit 2.9 0.0    Left Ulnar Anti Sensory (5th Digit)  32.7C  Wrist    *3.9 <3.7 *12.9 >15.0 Wrist 5th Digit 3.9 14.0 *36 >38  Right Ulnar Anti Sensory (5th Digit)  32.1C  Wrist    3.7 <3.7 17.5 >15.0 Wrist 5th Digit 3.7 14.0 38 >38   Motor Summary Table   Stim Site NR Onset (ms) Norm Onset (ms) O-P Amp (mV) Norm O-P Amp Site1 Site2 Delta-0 (ms) Dist (cm) Vel (m/s) Norm Vel (m/s)  Left Median Motor (Abd Poll Brev)  32.4C  Wrist    4.0 <4.2 *4.9 >5 Elbow Wrist 4.8 22.0 *46 >50  Elbow    8.8  3.5         Right Median Motor (Abd Poll Brev)  31.5C  Wrist    *4.5 <4.2 5.3 >5 Elbow Wrist 4.7 22.0 *47 >50  Elbow    9.2  4.9         Left Ulnar Motor (Abd Dig Min)  32.5C  Wrist    3.1 <4.2 6.8 >3  B Elbow Wrist 3.7 19.0 *51 >53  B Elbow    6.8  3.9  A Elbow B Elbow 1.8 9.0 *50 >53  A Elbow    8.6  4.4         Right Ulnar Motor (Abd Dig Min)  31.4C  Wrist    4.2 <4.2 7.8 >3 B Elbow Wrist 3.6 19.0 53 >53  B Elbow    7.8  5.2  A Elbow B Elbow 1.2 9.0 75 >53  A Elbow    9.0  5.4          EMG   Side Muscle Nerve Root Ins Act Fibs Psw Amp Dur Poly Recrt Int Fraser Din Comment  Right Abd Poll Brev Median C8-T1 Nml Nml Nml Nml Nml 0 Nml Nml   Right 1stDorInt Ulnar C8-T1 Nml Nml Nml Nml Nml 0 Nml Nml   Right PronatorTeres Median C6-7 Nml Nml Nml Nml Nml 0 Nml Nml   Right Biceps Musculocut C5-6 Nml Nml Nml Nml Nml 0 Nml Nml   Right Deltoid Axillary C5-6 Nml Nml Nml Nml Nml 0 Nml Nml     Nerve Conduction Studies Anti Sensory Left/Right Comparison   Stim Site L Lat (ms) R Lat (ms) L-R Lat (ms) L Amp (V) R Amp (V) L-R Amp (%) Site1 Site2 L Vel (m/s) R Vel (m/s) L-R Vel (m/s)  Median Acr Palm Anti Sensory (2nd Digit)  32.2C  Wrist *4.6 *5.1 0.5 11.0 *7.1 35.5 Wrist Palm     Palm 1.5 *2.3 0.8 1.5 5.3 71.7       Radial Anti Sensory (Base 1st Digit)  32.3C  Wrist 2.7 2.9 0.2 10.9 17.3 37.0 Wrist Base 1st Digit     Ulnar Anti Sensory (5th Digit)  32.7C  Wrist *3.9 3.7 0.2 *12.9 17.5  26.3 Wrist 5th Digit *36 38 2   Motor Left/Right Comparison   Stim Site L Lat (ms) R Lat (ms) L-R Lat (ms) L Amp (mV) R Amp (mV) L-R Amp (%) Site1 Site2 L Vel (m/s) R Vel (m/s) L-R Vel (m/s)  Median Motor (Abd Poll Brev)  32.4C  Wrist 4.0 *4.5 0.5 *4.9 5.3 7.5 Elbow Wrist *46 *47 1  Elbow 8.8 9.2 0.4 3.5 4.9 28.6       Ulnar Motor (Abd Dig Min)  32.5C  Wrist 3.1 4.2 *1.1 6.8 7.8 12.8 B Elbow Wrist *51 53 2  B Elbow 6.8 7.8 1.0 3.9 5.2 25.0 A Elbow B Elbow *50 75 *25  A Elbow 8.6 9.0 0.4 4.4 5.4 18.5          Waveforms:                     Clinical History: MRI LUMBAR SPINE WITHOUT AND WITH CONTRAST  Technique:  Multiplanar and multiecho pulse sequences of the lumbar spine were obtained without and with intravenous contrast.  Contrast: 53mL MULTIHANCE GADOBENATE DIMEGLUMINE 529 MG/ML IV SOLN  Comparison: CT scan of the abdomen dated 07/01/2010 performed at [facility].  Findings:  Scan extends from T11-12 through S3.  Tip of the conus is at L1 and appears normal.  Paraspinal soft tissues are normal.  T11-12 through L2-3:  No significant abnormalities.  L3-4:  Focal small disc protrusion in and lateral to the left neural foramen adjacent to but not appearing to compress the exiting left L3 nerve.  Slight narrowing of the left lateral recess without focal neural impingement.  L4-5:  2 mm spondylolisthesis due to  moderately severe bilateral facet arthritis.  Mild narrowing of the spinal canal.  Small diffuse bulge of the disc and slight narrowing of the neural foramina without focal neural impingement.  L5-S1:  Marked disc space narrowing with tiny broad-based disc bulge and osteophytes without neural impingement.  Evidence of previous left laminectomy.  Minimal enhancing scar around the left S1 nerve root sleeve.  Moderate bilateral foraminal stenosis but the L5 nerves appear to exit without impingement.  Slight bilateral facet  arthritis.  IMPRESSION: 1.  Focal small soft disc protrusion in and lateral to the left neural foramen at L3-4 without appreciable mass effect upon the left L3 nerve. 2.  Postsurgical changes at L5-S1 on the left with slight scarring around the left S1 nerve root sleeve. 3.  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  Original Report Authenticated By: Larey Seat, M.D.     Objective:  VS:  HT:     WT:    BMI:      BP:    HR: bpm   TEMP: ( )   RESP:  Physical Exam Musculoskeletal:        General: No swelling, tenderness or deformity.     Comments: Inspection reveals flattening of the right APB compared to left no atrophy of the bilateral FDI or hand intrinsics. There is no swelling, color changes, allodynia or dystrophic changes. There is 5 out of 5 strength in the bilateral wrist extension, finger abduction and long finger flexion.  There is decreased sensation to light touch in the right median nerve distribution.    Skin:    General: Skin is warm and dry.     Findings: No erythema or rash.  Neurological:     General: No focal deficit present.     Mental Status: She is alert and oriented to person, place, and time.     Motor: No weakness or abnormal muscle tone.     Coordination: Coordination normal.  Psychiatric:        Mood and Affect: Mood normal.        Behavior: Behavior normal.     Ortho Exam Imaging: No results found.

## 2018-12-04 ENCOUNTER — Ambulatory Visit (INDEPENDENT_AMBULATORY_CARE_PROVIDER_SITE_OTHER): Payer: Medicare Other | Admitting: Family Medicine

## 2018-12-04 ENCOUNTER — Encounter: Payer: Self-pay | Admitting: Physical Medicine and Rehabilitation

## 2018-12-04 ENCOUNTER — Encounter: Payer: Self-pay | Admitting: Family Medicine

## 2018-12-04 DIAGNOSIS — G5601 Carpal tunnel syndrome, right upper limb: Secondary | ICD-10-CM

## 2018-12-04 DIAGNOSIS — G5602 Carpal tunnel syndrome, left upper limb: Secondary | ICD-10-CM

## 2018-12-04 NOTE — Progress Notes (Signed)
Subjective: She is here for possible Hydro dissection for right wrist carpal tunnel syndrome.  In the past she had carpal tunnel syndrome which resolved with brace therapy.  She has not tried it yet for this problem.  Nerve study showed mild to moderate carpal tunnel syndrome.  Objective: Positive Tinel's at the carpal tunnel but negative Phalen's test.  No thenar atrophy.  Ultrasound nerve measurements show 8 mm, below the threshold for carpal tunnel syndrome diagnosis.  Impression: Probable right wrist carpal tunnel syndrome, cannot rule out cervical radiculopathy.  No sign of chemotherapy neuropathy on nerve studies.  Procedure: Patient desires to try ultrasound-guided hydrodissection.  After sterile prep with Betadine, injected 2 cc 1% lidocaine without epinephrine and 20 mg methylprednisolone using a 27-gauge needle, Hydro dissecting underneath the median nerve without complication.  She will also start using carpal tunnel night splints for the next 4 to 6 weeks.  Follow-up as needed.

## 2018-12-04 NOTE — Progress Notes (Signed)
[name] - 78 y.o. female MRN [id number]  Date of birth: Aug 21, 1940  Office Visit Note: Visit Date: 11/15/2018 PCP: Deland Pretty, MD Referred by: Deland Pretty, MD  Subjective: Chief Complaint  Patient presents with   Right Hand - Pain, Numbness   Left Hand - Pain, Numbness   Left Leg - Pain, Numbness   Right Leg - Pain, Numbness   HPI: Patty [name] is a 78 y.o. female who comes in today For a new patient consultation as requested by Dr. Deland Pretty for complaints of peripheral neuropathy and pain and numbness in the hands and sharp pain in the legs.  With a history of back surgery.  She has been followed recently by Dr. Karl Luke a neurologist at [facility].  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  The patient reports today that she has this neuropathy due to chemotherapy.  She has had a history of breast cancer and lymphoma status post chemotherapy she still follows along with oncology and hematology for that.  Her biggest complaint today with an average pain of 5 out of 5 is constant pain and prickly sensation in both hands right more than left and really in more of the radial digits on the right hand.  She endorses that these are worse at night and worse with certain activity.  She does use some tramadol from her primary care physician with some relief.  She tries to minimize this and only uses it when it severe.  She has a history of flaccid neurogenic bladder and does have to do self cathing her believe.  She reports that all of this is due to neuropathy.  She has had electrodiagnostic studies of the lower limbs and those were reviewed.  She has not had electrodiagnostic study of the upper limbs.  She is currently taking some gabapentin at night and this is been increased at night only.  She has not done well in the past with opioid medications and has an anaphylactic reaction to  morphine.  She actually sought care from a chiropractor who advertised treatment for neuropathy.  She received some sort of electrical stimulation that she referred to as [employer].  She reports that this cost about $3000 and did seem to help for a short while and she did get enough sensation in the legs that she felt like driving again.  This was fleeting.  Her numbness tingling and pain has really limited her activities.  In terms of her back she does not really endorse a great deal of back pain although that sometimes been present for a long time.  She was actually followed by Dr. Basil Dess in our office back in 2012.  She has had MRI and lumbar spine imaging over the last 10 years and more recently last year x-rays.  At least on the prior MRI there was no high-grade central stenosis and probably at her age it would have been present at that time.  She has noted generalized weakness but no specific foot drop. Daytime she is able to keep moving and doing activities, however at night unable to do anything just has to sit. She still goes out and is social. Saturday was the worst pain shes had and did not go to church due to pain and being up all night.  She has been fairly depressed because she cannot use her hands to quilt and do a  lot of things she did for hobbies.  Review of Systems  Constitutional: Positive for malaise/fatigue. Negative for chills, fever and weight loss.  HENT: Negative for hearing loss and sinus pain.   Eyes: Negative for blurred vision, double vision and photophobia.  Respiratory: Negative for cough and shortness of breath.   Cardiovascular: Negative for chest pain, palpitations and leg swelling.  Gastrointestinal: Negative for abdominal pain, nausea and vomiting.  Genitourinary: Negative for flank pain.  Musculoskeletal: Positive for back pain and joint pain. Negative for myalgias.  Skin: Negative for itching and rash.  Neurological: Positive for tingling and weakness. Negative for  tremors and focal weakness.  Endo/Heme/Allergies: Negative.   Psychiatric/Behavioral: Negative for depression.  All other systems reviewed and are negative.  Otherwise per HPI.  Assessment & Plan: Visit Diagnoses:  1. Post laminectomy syndrome   2. Polyneuropathy, unspecified   3. Paresthesia of skin   4. Carpal tunnel syndrome, right upper limb     Plan: Findings:  Patient has history of polyneuropathy of the lower limbs and a neurogenic flaccid bladder along with now pain numbness and tingling in the hands.  She has had good care with Dr. Everette Rank at [facility] in terms of neurologic follow-up and evaluation.  She clearly has neuropathy of the lower limbs.  MRI findings and spine findings really do not go along with the amount of problems she is having in the legs.  Consideration could be given to updating the MRI however.  I doubt that stenosis is an issue at this point but cannot rule that completely out.  In terms of her hands I think she has more carpal tunnel syndrome particularly on the right.  She has some impaired sensation in the median nerve distribution and clinically she is describing more symptoms she would expect with that versus a neuropathy.  It is hard to piece all of it together with a neurogenic bladder as well.  There is no issue in the lumbar spine great enough to cause the neurogenic bladder and this is been looked at by multiple physicians.  I think the next step is electrodiagnostic study of the upper limbs to see if there truly is a neuropathy versus carpal tunnel median neuropathy or cervical radiculopathy.  I want to go ahead and start increasing her gabapentin slowly and see if that helps.  I also want her to buy over-the-counter alpha lipoic acid which is had some data with neuropathy pain relief.  It is unfortunate that she spent a great deal of money on the electrical stimulation from the chiropractor.  As far as I know there is no really good data for anything like that  and I think that may be somewhat of a scam.  I will try to look into that more.  We will see her back for further treatment after the electrodiagnostic study of her hands.  I reviewed many pages of notes from several physicians concerning her care which is quite complicated.  We spoke for at least 45 minutes today about her ongoing problems with neuropathy and back problems and hand problems.    Meds & Orders:  Meds ordered this encounter  Medications   DISCONTD: gabapentin (NEURONTIN) 100 MG capsule    Sig: Take 1 capsule in the morning and increase weekly by 1 capsule until taking 3 capsules in the morning    Dispense:  90 capsule    Refill:  0   No orders of the defined types were placed in this  encounter.   Follow-up: Return for Electrodiagnostic study of the upper limbs.   Procedures: No procedures performed  No notes on file   Clinical History: LUMBAR SPINE - 2-3 VIEW  COMPARISON:  MRI lumbar spine 08/05/2016.  FINDINGS: Vertebral body height is maintained. Facet mediated grade 1 anterolisthesis L4 on L5 is unchanged. Scattered loss of disc space height appears worst at L5-S1 and unchanged compared to the prior MRI. No acute abnormality. Aortic atherosclerosis noted.  IMPRESSION: No acute finding.  No change in multilevel lumbar degenerative disease.   Electronically Signed   By: Inge Rise M.D.   On: 08/05/2017 11:32  ------ MRI LUMBAR SPINE WITHOUT AND WITH CONTRAST 12/2010  Technique: Multiplanar and multiecho pulse sequences of the lumbar spine were obtained without and with intravenous contrast.  Contrast: 12mL MULTIHANCE GADOBENATE DIMEGLUMINE 529 MG/ML IV SOLN  Comparison: CT scan of the abdomen dated 07/01/2010 performed at [facility].  Findings: Scan extends from T11-12 through S3. Tip of the conus is at L1 and appears normal. Paraspinal soft tissues are normal.  T11-12 through L2-3: No significant  abnormalities.  L3-4: Focal small disc protrusion in and lateral to the left neural foramen adjacent to but not appearing to compress the exiting left L3 nerve. Slight narrowing of the left lateral recess without focal neural impingement.  L4-5: 2 mm spondylolisthesis due to moderately severe bilateral facet arthritis. Mild narrowing of the spinal canal. Small diffuse bulge of the disc and slight narrowing of the neural foramina without focal neural impingement.  L5-S1: Marked disc space narrowing with tiny broad-based disc bulge and osteophytes without neural impingement. Evidence of previous left laminectomy. Minimal enhancing scar around the left S1 nerve root sleeve. Moderate bilateral foraminal stenosis but the L5 nerves appear to exit without impingement. Slight bilateral facet arthritis.  IMPRESSION: 1. Focal small soft disc protrusion in and lateral to the left neural foramen at L3-4 without appreciable mass effect upon the left L3 nerve. 2. Postsurgical changes at L5-S1 on the left with slight scarring around the left S1 nerve root sleeve. 3. 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  Original Report Authenticated By: Larey Seat, M.D.   She reports that she has never smoked. She has never used smokeless tobacco. No results for input(s): HGBA1C, LABURIC in the last 8760 hours.  Objective:  VS:  HT:     WT:    BMI:      BP:    HR: bpm   TEMP: ( )   RESP:  Physical Exam Vitals signs and nursing note reviewed.  Constitutional:      General: She is not in acute distress.    Appearance: Normal appearance. She is well-developed.     Comments: Thin appearing  HENT:     Head: Normocephalic and atraumatic.     Nose: Nose normal.     Mouth/Throat:     Mouth: Mucous membranes are moist.     Pharynx: Oropharynx is clear.  Eyes:     Conjunctiva/sclera: Conjunctivae normal.     Pupils: Pupils are equal, round,  and reactive to light.  Neck:     Musculoskeletal: Neck supple. No neck rigidity.  Cardiovascular:     Rate and Rhythm: Regular rhythm.  Pulmonary:     Effort: Pulmonary effort is normal. No respiratory distress.  Abdominal:     General: There is no distension.     Palpations: Abdomen is soft.  Tenderness: There is no guarding.  Musculoskeletal:     Right lower leg: No edema.     Left lower leg: No edema.     Comments: Examination of the cervical spine shows forward flexed cervical spine with some tender points across the upper shoulders.  No great deal of impingement although more impingement on the right shoulder.  She has good strength in the upper extremities bilaterally.  She has a negative Hoffmann's test.  She has a positive Phalen's on the right more than left.  Negative Tinel's test.  Somewhat decreased sensation to light touch subjectively in the right median nerve distribution.  Examination of the legs show some swelling of the ankles but mild.  She has no pain with hip rotation she has good distal strength.  She does seem to have some impaired sensation in a stocking distribution.  Lymphadenopathy:     Cervical: No cervical adenopathy.  Skin:    General: Skin is warm and dry.     Findings: No erythema or rash.  Neurological:     General: No focal deficit present.     Mental Status: She is alert and oriented to person, place, and time.     Sensory: Sensory deficit present.     Motor: No abnormal muscle tone.     Coordination: Coordination normal.     Gait: Gait abnormal.  Psychiatric:        Mood and Affect: Mood normal.        Behavior: Behavior normal.        Thought Content: Thought content normal.     Ortho Exam Imaging: No results found.  Past Medical/Family/Surgical/Social History: Medications & Allergies reviewed per EMR, new medications updated. 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   Weight gain 04/08/2016   Left knee pain 12/12/2015   Lumbar facet arthropathy 08/05/2015   Left leg weakness 08/05/2015   Low back pain 08/05/2015   Diabetic peripheral neuropathy ([place]) 08/05/2015   Bradykinesia 08/05/2015   RBBB 07/30/2015   Essential hypertension 07/30/2015   Osteopenia 07/11/2015   Cancer of right breast ([place]) 05/27/2015   Breast cancer of upper-inner quadrant of right female breast ([place]) 04/24/2015   Nausea vomiting and diarrhea 03/23/2015   Controlled type 2 diabetes mellitus without complication, without long-term current use of insulin ([place]) 03/23/2015   Dyslipidemia associated with type 2 diabetes mellitus ([place]) 03/23/2015   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   Long term current use of anticoagulant therapy 04/10/2014   UTI (urinary tract infection) 04/10/2014   Leukocytosis 03/14/2014   Anemia in chronic illness 12/28/2013   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, face, and neck ([place]) 12/26/2013   Hypothyroidism 06/15/2010   Paroxysmal atrial fibrillation ([place]) 05/28/2010   Past Medical History:  Diagnosis Date   Acute on chronic combined systolic and diastolic CHF (congestive heart failure) ([place])    Arthritis    Asthma    Atrial flutter ([place])    PAROXYSMAL, EKG 10/06/2017, ATRIAL FLUTTER (ATYPICAL) WITH VARIABLE VENTRICULAR RESPONCE AND CONTROLLED   Atrial flutter, paroxysmal ([place]) 06/02/2018   Breast cancer, right ([place]) 2/17   R sided, s/p total mastectomy   Bulging lumbar disc    3   Chronic kidney  disease    recurring UTI   Complication of anesthesia    "morphine made me stop breathing"   Concentric left ventricular hypertrophy    MILD DECREASE IN GLOBAL WALL  MOTION. DOPPLER EVIDENCE OF GRADE II (PSEUDONORMAL) DIASTOLIC DYSFUNCTION, ELEVATED LAP, CALCULATED EF 45%, LEFT ATRIAL CAVITY IS MILDLY DILATED. MILD TO MODERATE AORTIC REGURGITATION. MILD (GRADE I) MITRAL REGURGITATION. MILD TRICUSPID REGURGITATION. ESTIMATED PULMONARY ARTERY SYSTOLIC PRESSURE 22 MMHG.   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)   First degree AV block    HCAP (healthcare-associated pneumonia) 12/2013   Archie Endo 01/18/2014   History of cardioversion 06/02/2018   History of skin cancer    History of transfusion    Hypertension    Hypothyroidism    Left atrial enlargement    Leg swelling    Lymphoma, non-Hodgkin's (HCC)    in remission   Malaise and fatigue    Nocturia    Nonischemic cardiomyopathy ([place]) 06/02/2018   Paroxysmal atrial fibrillation (HCC)    Persistent atrial fibrillation (HCC)    Pure hypercholesterolemia    Shortness of breath dyspnea    "at times"   Skin cancer    Sleep apnea    uses c-pap   Type II diabetes mellitus ([place])    Family History  Problem Relation Age of Onset   Stroke Father 69   Heart attack Father 89   Hypertension Father    Heart disease Mother    Arthritis Mother    Breast cancer Mother 15   Heart failure Mother    Heart attack Brother    Hypertension Brother    Diabetes Sister    Cancer Sister 3   Past Surgical History:  Procedure Laterality Date   ABDOMINAL HYSTERECTOMY  1972   partial   ABDOMINAL HYSTERECTOMY     APPENDECTOMY  1953   ATRIAL FIBRILLATION ABLATION  08/19/2015   PVI with CTI ablation by Dr Rayann Heman   ATRIAL FIBRILLATION ABLATION N/A 04/14/2018   Procedure: ATRIAL FIBRILLATION ABLATION;  Surgeon: Thompson Grayer, MD;  Location: [facility];  Service: Cardiovascular;  Laterality: N/A;   BACK SURGERY     x2   BIOPSY  02/13/2018   Procedure: BIOPSY;  Surgeon: Ronnette Juniper, MD;  Location: [facility];  Service:  Gastroenterology;;  X2=EGD & Colon   BREAST SURGERY     Right mastectomy   CARDIAC CATHETERIZATION  10/10/2008   Nonobstructive CAD   CARDIOVERSION  04/13/2011   Procedure: CARDIOVERSION;  Surgeon: Laverda Page, MD;  Location: [facility];  Service: Cardiovascular;  Laterality: N/A;   CARDIOVERSION N/A 06/18/2015   Procedure: CARDIOVERSION;  Surgeon: Adrian Prows, MD;  Location: [facility];  Service: Cardiovascular;  Laterality: N/A;   CARDIOVERSION N/A 10/26/2016   Procedure: CARDIOVERSION;  Surgeon: Adrian Prows, MD;  Location: [facility];  Service: Cardiovascular;  Laterality: N/A;   CARDIOVERSION N/A 10/11/2017   Procedure: CARDIOVERSION;  Surgeon: Nigel Mormon, MD;  Location: [facility];  Service: Cardiovascular;  Laterality: N/A;   CARDIOVERSION N/A 05/23/2018   Procedure: CARDIOVERSION;  Surgeon: Adrian Prows, MD;  Location: [facility];  Service: Cardiovascular;  Laterality: N/A;   CATARACT EXTRACTION W/ INTRAOCULAR LENS  IMPLANT, BILATERAL Bilateral    CHOLECYSTECTOMY  1993   COLONOSCOPY N/A 02/13/2018   Procedure: COLONOSCOPY;  Surgeon: Ronnette Juniper, MD;  Location: [facility];  Service: Gastroenterology;  Laterality: N/A;   ELECTROPHYSIOLOGIC STUDY N/A 08/19/2015   Procedure: Atrial Fibrillation Ablation;  Surgeon: [place] Grayer, MD;  Location: [facility];  Service: Cardiovascular;  Laterality: N/A;   ESOPHAGOGASTRODUODENOSCOPY N/A 02/13/2018   Procedure: ESOPHAGOGASTRODUODENOSCOPY (EGD);  Surgeon: Ronnette Juniper, MD;  Location: [facility];  Service: Gastroenterology;  Laterality: N/A;   FOOT SURGERY     IR REMOVAL TUN ACCESS W/ PORT W/O FL MOD SED  04/01/2017   LUMBAR LAMINECTOMY     MASS BIOPSY Left 12/07/2013   Procedure: EXCISIONAL BIOPSY LEFT NECK MASS ;  Surgeon: Jerrell Belfast, MD;  Location: [facility];  Service: ENT;  Laterality: Left;   MASTECTOMY Right 2017   malignant   POLYPECTOMY  02/13/2018   Procedure: POLYPECTOMY;  Surgeon: Ronnette Juniper, MD;   [facility];  Service: Gastroenterology;;  EGD   PORTACATH PLACEMENT  12/2013   Archie Endo 01/04/2014   PUBOVAGINAL SLING     SKIN TAG REMOVAL  2012   leg   TEE WITHOUT CARDIOVERSION N/A 08/19/2015   Procedure: TRANSESOPHAGEAL ECHOCARDIOGRAM (TEE);  Surgeon: Larey Dresser, MD;  Location: [facility];  Service: Cardiovascular;  Laterality: N/A;   TONSILLECTOMY     TOTAL MASTECTOMY Right 05/27/2015   Procedure: RIGHT TOTAL MASTECTOMY;  Surgeon: Fanny Skates, MD;  Location[facility];  Service: General;  Laterality: Right;   Social History   Occupational History   Not on file  Tobacco Use   Smoking status: Never Smoker   Smokeless tobacco: Never Used  Substance and Sexual Activity   Alcohol use: No    Alcohol/week: 0.0 standard drinks   Drug use: No   Sexual activity: Not Currently    Birth control/protection: Surgical

## 2018-12-08 DIAGNOSIS — Z23 Encounter for immunization: Secondary | ICD-10-CM | POA: Diagnosis not present

## 2018-12-13 DIAGNOSIS — M810 Age-related osteoporosis without current pathological fracture: Secondary | ICD-10-CM | POA: Diagnosis not present

## 2018-12-13 DIAGNOSIS — N3000 Acute cystitis without hematuria: Secondary | ICD-10-CM | POA: Diagnosis not present

## 2018-12-14 DIAGNOSIS — N312 Flaccid neuropathic bladder, not elsewhere classified: Secondary | ICD-10-CM | POA: Diagnosis not present

## 2018-12-14 DIAGNOSIS — N3020 Other chronic cystitis without hematuria: Secondary | ICD-10-CM | POA: Diagnosis not present

## 2018-12-15 ENCOUNTER — Encounter: Payer: Self-pay | Admitting: Anesthesiology

## 2018-12-20 ENCOUNTER — Telehealth: Payer: Self-pay | Admitting: Physical Medicine and Rehabilitation

## 2018-12-20 DIAGNOSIS — M170 Bilateral primary osteoarthritis of knee: Secondary | ICD-10-CM | POA: Diagnosis not present

## 2018-12-20 DIAGNOSIS — M810 Age-related osteoporosis without current pathological fracture: Secondary | ICD-10-CM | POA: Diagnosis not present

## 2018-12-20 DIAGNOSIS — M7582 Other shoulder lesions, left shoulder: Secondary | ICD-10-CM | POA: Diagnosis not present

## 2018-12-20 DIAGNOSIS — M549 Dorsalgia, unspecified: Secondary | ICD-10-CM | POA: Diagnosis not present

## 2018-12-20 DIAGNOSIS — M1180 Other specified crystal arthropathies, unspecified site: Secondary | ICD-10-CM | POA: Diagnosis not present

## 2018-12-20 DIAGNOSIS — R748 Abnormal levels of other serum enzymes: Secondary | ICD-10-CM | POA: Diagnosis not present

## 2018-12-21 ENCOUNTER — Other Ambulatory Visit: Payer: Self-pay | Admitting: Physical Medicine and Rehabilitation

## 2018-12-21 MED ORDER — GABAPENTIN 300 MG PO CAPS: 300.0000 mg | ORAL_CAPSULE | Freq: Two times a day (BID) | ORAL | 3 refills | Status: DC

## 2018-12-21 NOTE — Telephone Encounter (Signed)
Done for BID, do not think she was taking TID

## 2018-12-29 ENCOUNTER — Inpatient Hospital Stay: Payer: Medicare Other | Attending: Hematology and Oncology

## 2018-12-29 ENCOUNTER — Telehealth: Payer: Self-pay | Admitting: *Deleted

## 2018-12-29 ENCOUNTER — Other Ambulatory Visit: Payer: Self-pay

## 2018-12-29 DIAGNOSIS — D509 Iron deficiency anemia, unspecified: Secondary | ICD-10-CM | POA: Insufficient documentation

## 2018-12-29 DIAGNOSIS — C50211 Malignant neoplasm of upper-inner quadrant of right female breast: Secondary | ICD-10-CM | POA: Diagnosis not present

## 2018-12-29 DIAGNOSIS — D500 Iron deficiency anemia secondary to blood loss (chronic): Secondary | ICD-10-CM

## 2018-12-29 LAB — IRON AND TIBC
Iron: 103 ug/dL (ref 28–170)
Saturation Ratios: 28 % (ref 10.4–31.8)
TIBC: 368 ug/dL (ref 250–450)
UIBC: 265 ug/dL

## 2018-12-29 LAB — CBC WITH DIFFERENTIAL/PLATELET
Abs Immature Granulocytes: 0.09 10*3/uL — ABNORMAL HIGH (ref 0.00–0.07)
Basophils Absolute: 0.1 10*3/uL (ref 0.0–0.1)
Basophils Relative: 1 %
Eosinophils Absolute: 0.1 10*3/uL (ref 0.0–0.5)
Eosinophils Relative: 2 %
HCT: 35.2 % — ABNORMAL LOW (ref 36.0–46.0)
Hemoglobin: 11.9 g/dL — ABNORMAL LOW (ref 12.0–15.0)
Immature Granulocytes: 1 %
Lymphocytes Relative: 18 %
Lymphs Abs: 1.5 10*3/uL (ref 0.7–4.0)
MCH: 31 pg (ref 26.0–34.0)
MCHC: 33.8 g/dL (ref 30.0–36.0)
MCV: 91.7 fL (ref 80.0–100.0)
Monocytes Absolute: 0.7 10*3/uL (ref 0.1–1.0)
Monocytes Relative: 8 %
Neutro Abs: 5.8 10*3/uL (ref 1.7–7.7)
Neutrophils Relative %: 70 %
Platelets: 206 10*3/uL (ref 150–400)
RBC: 3.84 MIL/uL — ABNORMAL LOW (ref 3.87–5.11)
RDW: 14.2 % (ref 11.5–15.5)
WBC: 8.3 10*3/uL (ref 4.0–10.5)
nRBC: 0 % (ref 0.0–0.2)

## 2018-12-29 LAB — FERRITIN: Ferritin: 533 ng/mL — ABNORMAL HIGH (ref 11–307)

## 2018-12-29 NOTE — Telephone Encounter (Signed)
Patient called and advised lab results as directed below.

## 2018-12-29 NOTE — Telephone Encounter (Signed)
-----   Message from Heath Lark, MD sent at 12/29/2018  1:17 PM EDT ----- Regarding: labs today Pls let her know hemoglobin is nearly normal and iron studies are good

## 2019-01-03 ENCOUNTER — Ambulatory Visit: Payer: Medicare Other | Admitting: Podiatry

## 2019-01-03 DIAGNOSIS — L570 Actinic keratosis: Secondary | ICD-10-CM | POA: Diagnosis not present

## 2019-01-03 DIAGNOSIS — D225 Melanocytic nevi of trunk: Secondary | ICD-10-CM | POA: Diagnosis not present

## 2019-01-03 DIAGNOSIS — L821 Other seborrheic keratosis: Secondary | ICD-10-CM | POA: Diagnosis not present

## 2019-01-03 DIAGNOSIS — Z85828 Personal history of other malignant neoplasm of skin: Secondary | ICD-10-CM | POA: Diagnosis not present

## 2019-01-03 DIAGNOSIS — D1801 Hemangioma of skin and subcutaneous tissue: Secondary | ICD-10-CM | POA: Diagnosis not present

## 2019-01-03 DIAGNOSIS — L820 Inflamed seborrheic keratosis: Secondary | ICD-10-CM | POA: Diagnosis not present

## 2019-01-10 ENCOUNTER — Other Ambulatory Visit: Payer: Self-pay

## 2019-01-10 ENCOUNTER — Encounter: Payer: Self-pay | Admitting: Podiatry

## 2019-01-10 ENCOUNTER — Ambulatory Visit (INDEPENDENT_AMBULATORY_CARE_PROVIDER_SITE_OTHER): Payer: Medicare Other | Admitting: Podiatry

## 2019-01-10 DIAGNOSIS — M79675 Pain in left toe(s): Secondary | ICD-10-CM | POA: Diagnosis not present

## 2019-01-10 DIAGNOSIS — B351 Tinea unguium: Secondary | ICD-10-CM

## 2019-01-10 DIAGNOSIS — E1142 Type 2 diabetes mellitus with diabetic polyneuropathy: Secondary | ICD-10-CM

## 2019-01-10 DIAGNOSIS — M79674 Pain in right toe(s): Secondary | ICD-10-CM

## 2019-01-10 DIAGNOSIS — S90221A Contusion of right lesser toe(s) with damage to nail, initial encounter: Secondary | ICD-10-CM | POA: Diagnosis not present

## 2019-01-10 NOTE — Patient Instructions (Signed)
Diabetes Mellitus and Foot Care Foot care is an important part of your health, especially when you have diabetes. Diabetes may cause you to have problems because of poor blood flow (circulation) to your feet and legs, which can cause your skin to:  Become thinner and drier.  Break more easily.  Heal more slowly.  Peel and crack. You may also have nerve damage (neuropathy) in your legs and feet, causing decreased feeling in them. This means that you may not notice minor injuries to your feet that could lead to more serious problems. Noticing and addressing any potential problems early is the best way to prevent future foot problems. How to care for your feet Foot hygiene  Wash your feet daily with warm water and mild soap. Do not use hot water. Then, pat your feet and the areas between your toes until they are completely dry. Do not soak your feet as this can dry your skin.  Trim your toenails straight across. Do not dig under them or around the cuticle. File the edges of your nails with an emery board or nail file.  Apply a moisturizing lotion or petroleum jelly to the skin on your feet and to dry, brittle toenails. Use lotion that does not contain alcohol and is unscented. Do not apply lotion between your toes. Shoes and socks  Wear clean socks or stockings every day. Make sure they are not too tight. Do not wear knee-high stockings since they may decrease blood flow to your legs.  Wear shoes that fit properly and have enough cushioning. Always look in your shoes before you put them on to be sure there are no objects inside.  To break in new shoes, wear them for just a few hours a day. This prevents injuries on your feet. Wounds, scrapes, corns, and calluses  Check your feet daily for blisters, cuts, bruises, sores, and redness. If you cannot see the bottom of your feet, use a mirror or ask someone for help.  Do not cut corns or calluses or try to remove them with medicine.  If you  find a minor scrape, cut, or break in the skin on your feet, keep it and the skin around it clean and dry. You may clean these areas with mild soap and water. Do not clean the area with peroxide, alcohol, or iodine.  If you have a wound, scrape, corn, or callus on your foot, look at it several times a day to make sure it is healing and not infected. Check for: ? Redness, swelling, or pain. ? Fluid or blood. ? Warmth. ? Pus or a bad smell. General instructions  Do not cross your legs. This may decrease blood flow to your feet.  Do not use heating pads or hot water bottles on your feet. They may burn your skin. If you have lost feeling in your feet or legs, you may not know this is happening until it is too late.  Protect your feet from hot and cold by wearing shoes, such as at the beach or on hot pavement.  Schedule a complete foot exam at least once a year (annually) or more often if you have foot problems. If you have foot problems, report any cuts, sores, or bruises to your health care provider immediately. Contact a health care provider if:  You have a medical condition that increases your risk of infection and you have any cuts, sores, or bruises on your feet.  You have an injury that is not   healing.  You have redness on your legs or feet.  You feel burning or tingling in your legs or feet.  You have pain or cramps in your legs and feet.  Your legs or feet are numb.  Your feet always feel cold.  You have pain around a toenail. Get help right away if:  You have a wound, scrape, corn, or callus on your foot and: ? You have pain, swelling, or redness that gets worse. ? You have fluid or blood coming from the wound, scrape, corn, or callus. ? Your wound, scrape, corn, or callus feels warm to the touch. ? You have pus or a bad smell coming from the wound, scrape, corn, or callus. ? You have a fever. ? You have a red line going up your leg. Summary  Check your feet every day  for cuts, sores, red spots, swelling, and blisters.  Moisturize feet and legs daily.  Wear shoes that fit properly and have enough cushioning.  If you have foot problems, report any cuts, sores, or bruises to your health care provider immediately.  Schedule a complete foot exam at least once a year (annually) or more often if you have foot problems. This information is not intended to replace advice given to you by your health care provider. Make sure you discuss any questions you have with your health care provider. Document Released: 02/13/2000 Document Revised: 03/30/2017 Document Reviewed: 03/19/2016 Elsevier Patient Education  2020 [employer]  

## 2019-01-17 NOTE — Progress Notes (Signed)
Subjective: Patty [name] presents today with history of diabetic neuropathy. Patient seen for follow up of chronic, painful mycotic toenails which interfere with daily activities and routine tasks.  Pain is aggravated when wearing enclosed shoe gear. Pain is getting progressively worse and relieved with periodic professional debridement.   Patient has injury to toenail of the right third digit.  She notes that there is blood under the nail.  She cannot relate any episode of trauma but states it appeared about a month ago.  She is not experiencing any pain.  There is no redness or swelling.  She has not noted any drainage.  She is also noted to be taking Xarelto.  Deland Pretty, MD is her PCP.  Current Outpatient Medications on File Prior to Visit  Medication Sig Dispense Refill  . albuterol (PROVENTIL HFA;VENTOLIN HFA) 108 (90 Base) MCG/ACT inhaler Inhale 1-2 puffs into the lungs every 6 (six) hours as needed for wheezing or shortness of breath.     . anastrozole (ARIMIDEX) 1 MG tablet Take 1 tablet (1 mg total) by mouth daily. 90 tablet 3  . Cholecalciferol (VITAMIN D) 2000 UNITS tablet Take 2,000 Units by mouth daily with lunch.    . Cyanocobalamin (B-12) 2500 MCG TABS Take 2,500 mcg by mouth daily with lunch.     . diltiazem (CARDIZEM CD) 120 MG 24 hr capsule Take 1 capsule (120 mg total) by mouth daily. 90 capsule 1  . Fluticasone-Salmeterol (ADVAIR) 250-50 MCG/DOSE AEPB Inhale 1 puff into the lungs every 12 (twelve) hours.     . gabapentin (NEURONTIN) 300 MG capsule Take 1 capsule (300 mg total) by mouth 2 (two) times daily. 90 capsule 3  . levothyroxine (SYNTHROID, LEVOTHROID) 200 MCG tablet Take 200 mcg by mouth daily before breakfast.    . Magnesium Oxide 500 MG TABS Take 500 mg by mouth 2 (two) times daily.     . metFORMIN (GLUCOPHAGE-XR) 500 MG 24 hr tablet Take 500 mg by mouth 2 (two) times daily with a meal.    . montelukast (SINGULAIR) 10 MG tablet Take 10 mg by mouth daily.    Marland Kitchen  omega-3 acid ethyl esters (LOVAZA) 1 g capsule Take 1 g by mouth 2 (two) times daily.    Marland Kitchen PROLIA 60 MG/ML SOSY injection Inject 60 mg into the vein every 6 (six) months.    . rivaroxaban (XARELTO) 20 MG TABS tablet Take 1 tablet (20 mg total) by mouth daily with supper. 90 tablet 0  . traMADol-acetaminophen (ULTRACET) 37.5-325 MG tablet Take 1 tablet by mouth every 6 (six) hours as needed (pain).    . triamterene-hydrochlorothiazide (MAXZIDE-25) 37.5-25 MG tablet Take 1 tablet by mouth daily as needed (for fluid retention/swelling.).      No current facility-administered medications on file prior to visit.     Allergies  Allergen Reactions  . Morphine And Related Anaphylaxis and Other (See Comments)    Pt states she stopped breathing post op- "went into resp. arrest"  . Multaq [Dronedarone] Other (See Comments)    Reaction:  Blood in urine and elevated liver enzymes.   . Demerol Nausea And Vomiting  . Flecainide Other (See Comments)    Mobitz II AV Block  . Oysters [Shellfish Allergy] Rash    & CLAMS  . Penicillins Itching and Rash    Has patient had a PCN reaction causing immediate rash, facial/tongue/throat swelling, SOB or lightheadedness with hypotension: No Has patient had a PCN reaction causing severe rash involving mucus membranes  or skin necrosis: Yes Has patient had a PCN reaction that required hospitalization No Has patient had a PCN reaction occurring within the last 10 years: No If all of the above answers are "NO", then may proceed with Cephalosporin use.   . Sulfa Drugs Cross Reactors Rash    Objective: There were no vitals filed for this visit.  Vascular Examination: Capillary refill time immediate to all 10 digits.  Dorsalis pedis and posterior tibial pulses are palpable bilaterally.  Digital hair is sparse bilaterally.  Skin temperature WNL b/l.  Dermatological Examination: Skin with normal turgor, texture and tone b/l.  Toenails 1-5 b/l discolored,  thick, dystrophic with subungual debris and pain with palpation to nailbeds due to thickness of nails.  Subungual hematoma noted of the right third digit. Appears to be subacute with dark heme under the toenail. Nailplate remains adhered. No erythema, no edema, no active drainage, no flocculence, no fluid expressed when pressure applied to digit.  Digit is stable.  Musculoskeletal: Muscle strength 5/5 to all LE muscle groups bilaterally.  Neurological: Sensation diminished with 10 gram monofilament .  Assessment: 1. Painful onychomycosis toenails 1-5 b/l 2. Subacute subungual hematoma right third digit, stable 3. NIDDM with neuropathy  Plan: 1. Continue diabetic foot care principles.   2. Toenails 1-5 b/l were debrided in length and girth without iatrogenic bleeding. 3. For subungual hematoma right third digit, we will monitor for now.  She is to contact us with any changes in digit. 4. Patient to continue soft, supportive shoe gear daily. 5. Patient to report any pedal injuries to medical professional immediately. 6. Follow up 3 months.  7. Patient/POA to call should there be a concern in the interim.

## 2019-01-31 ENCOUNTER — Other Ambulatory Visit: Payer: Self-pay

## 2019-01-31 ENCOUNTER — Ambulatory Visit (INDEPENDENT_AMBULATORY_CARE_PROVIDER_SITE_OTHER): Payer: Medicare Other | Admitting: Physical Medicine and Rehabilitation

## 2019-01-31 ENCOUNTER — Encounter: Payer: Self-pay | Admitting: Physical Medicine and Rehabilitation

## 2019-01-31 VITALS — BP 136/74 | HR 80 | Ht 66.5 in | Wt 140.0 lb

## 2019-01-31 DIAGNOSIS — M961 Postlaminectomy syndrome, not elsewhere classified: Secondary | ICD-10-CM | POA: Diagnosis not present

## 2019-01-31 DIAGNOSIS — E1142 Type 2 diabetes mellitus with diabetic polyneuropathy: Secondary | ICD-10-CM | POA: Diagnosis not present

## 2019-01-31 DIAGNOSIS — G5601 Carpal tunnel syndrome, right upper limb: Secondary | ICD-10-CM

## 2019-01-31 NOTE — Progress Notes (Signed)
 .  Numeric Pain Rating Scale and Functional Assessment Average Pain 6 Pain Right Now 8 My pain is constant, burning and numbness Pain is worse with: unsure and some activites Pain improves with: nothing   In the last MONTH (on 0-10 scale) has pain interfered with the following?  1. General activity like being  able to carry out your everyday physical activities such as walking, climbing stairs, carrying groceries, or moving a chair?  Rating(9)  2. Relation with others like being able to carry out your usual social activities and roles such as  activities at home, at work and in your community. Rating(9)  3. Enjoyment of life such that you have  been bothered by emotional problems such as feeling anxious, depressed or irritable?  Rating(6)

## 2019-02-07 ENCOUNTER — Encounter: Payer: Self-pay | Admitting: Physical Medicine and Rehabilitation

## 2019-02-07 NOTE — Progress Notes (Signed)
SHERETTA SURRETT - 78 y.o. female MRN [id number]  Date of birth: 09-25-1940  Office Visit Note: Visit Date: 01/31/2019 PCP: Deland Pretty, MD Referred by: Deland Pretty, MD  Subjective: Chief Complaint  Patient presents with  . Right Leg - Numbness  . Left Leg - Numbness  . Right Hand - Pain, Numbness   HPI: KELBI CUTLIP is a 78 y.o. female who comes in today For reevaluation and management of bilateral leg paresthesias from peripheral neuropathy as well as hand numbness and tingling and overall history of some low back pain.  Her back pain is been managed with medication and prior history of lumbar surgery and her biggest issue has been the peripheral neuropathy in the legs with numbness and tingling to both feet to the bottom of the feet bilaterally no differences.  We saw her initially and discussed the fact that there really was not a lot that can be done for neuropathy.  She really has tried everything including chiropractic care with some sort of a stimulator device which I think was partly a scam.  We started her on alpha lipoic acid and slowly increase her gabapentin.  She is now taking gabapentin 300 mg twice a day and actually does report some differences in the symptoms.  She has tolerated that gabapentin fairly well without too much somnolence or sedation.  Since I have seen her she is also seen Dr. Eunice Blase for her carpal tunnel syndrome that we diagnosed on electrodiagnostic study.  He completed hydrodissection injection and she has had actually good relief of her symptoms in the hand.  She reports some feeling has come back in the hand.  I do not think this is part of her neuropathy at this point.  She has had no changes otherwise in her symptoms no focal weakness.  Just ongoing issues with the neuropathy.  She is happy to have found out that the neuropathy is probably not affecting the hands at this point that is probably a different issue.  Review of Systems   Constitutional: Negative for chills, fever, malaise/fatigue and weight loss.  HENT: Negative for hearing loss and sinus pain.   Eyes: Negative for blurred vision, double vision and photophobia.  Respiratory: Negative for cough and shortness of breath.   Cardiovascular: Negative for chest pain, palpitations and leg swelling.  Gastrointestinal: Negative for abdominal pain, nausea and vomiting.  Genitourinary: Negative for flank pain.  Musculoskeletal: Positive for back pain and joint pain. Negative for myalgias.  Skin: Negative for itching and rash.  Neurological: Positive for tingling. Negative for tremors, focal weakness and weakness.  Endo/Heme/Allergies: Negative.   Psychiatric/Behavioral: Negative for depression.  All other systems reviewed and are negative.  Otherwise per HPI.  Assessment & Plan: Visit Diagnoses:  1. Diabetic peripheral neuropathy ([place])   2. Post laminectomy syndrome   3. Carpal tunnel syndrome, right upper limb     Plan: Findings:  Mixture of diabetic peripheral neuropathy with potential for post chemotherapy neuropathy.  She has been followed by neurology with appropriate studies performed.  We increased gabapentin slowly and at this point she is tolerating that well at twice a day dosing and finds that it is helping.  She has not been able to tell if the alpha lipoic acid is helping or not.  The alpha lipoic acid in general should be safe to take and I think she can continue with that as well.  We will slowly try to increase the gabapentin.  She  had questions today about whether the gabapentin would help her neurogenic bladder and again I tried to have a long discussion with her that neuropathy means nerve damage in general and that once the nerves are damaged there is really not much that we can do to bring that nerve back and fortunately the neurogenic bladder is probably here to stay and any questions concerning that I think I would defer to the urologist.  Carpal  tunnel syndrome in the hand she has done well with hydrodissection under ultrasound through Dr. Junius Roads.  She had significant damage on the electrodiagnostic study and actually would probably benefit from open carpal tunnel release which is a fairly mild procedure but right now she is doing well so we will just watch for that.    Meds & Orders: No orders of the defined types were placed in this encounter.  No orders of the defined types were placed in this encounter.   Follow-up: Return if symptoms worsen or fail to improve.   Procedures: No procedures performed  No notes on file   Clinical History: LUMBAR SPINE - 2-3 VIEW  COMPARISON: MRI lumbar spine 08/05/2016.  FINDINGS: Vertebral body height is maintained. Facet mediated grade 1 anterolisthesis L4 on L5 is unchanged. Scattered loss of disc space height appears worst at L5-S1 and unchanged compared to the prior MRI. No acute abnormality. Aortic atherosclerosis noted.  IMPRESSION: No acute finding.  No change in multilevel lumbar degenerative disease.   Electronically Signed By: Inge Rise M.D. On: 08/05/2017 11:32  ------ Is an abnormal study that shows a disc herniation at the L4-5 level. The disc fragment extends to the left of the midline and there is potential for compression of the left L5 nerve root. There is also a disc bulge at the L5-S1 level with potential for compression of the left S1 nerve root in the lateral recess. There is a disc bulge at the L3-4 level without evidence of nerve root compression Result Narrative MRI LUMBAR SPINE WITH AND WITHOUT CONTRAST, 08/05/2016 11:16 AM   INDICATION: lumbar radiculopathy \ R29.898 Left leg weakness \ M51.16 Lumbar disc herniation with radiculopathy   COMPARISON: None.   TECHNIQUE: Multiplanar, multi-sequence surface-coil MR imaging of the lumbar spine was performed before and after intravenous administration of gadolinium-based contrast.   LEVELS IMAGED:  Lower thoracic to upper sacral region.   FINDINGS:  Alignment: Within normal limits. Vertebrae: No marrow signal abnormalities to suggest neoplasm. Conus: In normal position without imaging evidence for tethering. Normal signal and contour.   T12-L1: No significant focal abnormality. L1-L2: No significant focal abnormality. L2-L3: No significant focal abnormality. L3-L4: There is a diffuse disc protrusion. This is associated with prominent facet joint hypertrophy seen bilaterally. There is no definite evidence of nerve root compression and only mild central canal stenosis.Marland Kitchen L4-L5: There is a diffuse disc extrusion. There is associated prominent facet joint hypertrophy and facet joint effusion. There is also some ligamentum flavum hypertrophy. This produces mild central canal stenosis. There is potential for compression of the left L5 nerve root in the lateral recess.Marland Kitchen L5-S1: There is a diffuse disc protrusion. There is potential for compression of the left S1 nerve root in the lateral recess. There is prominent facet joint hypertrophy seen bilaterally. Upper sacrum/Ilium: No significant focal abnormality. Contrast: No abnormal enhancement within the spinal canal to suggest neoplasm or infection.   She reports that she has never smoked. She has never used smokeless tobacco. No results for input(s): HGBA1C, LABURIC in the  last 8760 hours.  Objective:  VS:  HT:5' 6.5" (168.9 cm)   WT:140 lb (63.5 kg)  BMI:22.26    BP:136/74  HR:80bpm  TEMP: ( )  RESP:  Physical Exam Vitals signs and nursing note reviewed.  Constitutional:      General: She is not in acute distress.    Appearance: Normal appearance. She is well-developed.  HENT:     Head: Normocephalic and atraumatic.     Nose: Nose normal.     Mouth/Throat:     Mouth: Mucous membranes are moist.     Pharynx: Oropharynx is clear.  Eyes:     Conjunctiva/sclera: Conjunctivae normal.     Pupils: Pupils are equal, round, and  reactive to light.  Neck:     Musculoskeletal: Normal range of motion and neck supple.  Cardiovascular:     Rate and Rhythm: Regular rhythm.  Pulmonary:     Effort: Pulmonary effort is normal. No respiratory distress.  Abdominal:     General: There is no distension.     Palpations: Abdomen is soft.     Tenderness: There is no guarding.  Musculoskeletal:     Right lower leg: No edema.     Left lower leg: No edema.     Comments: Examination of the hands bilaterally show arthritic changes of the hands no synovitis slight atrophy and flattening of the APB bilaterally.  She has good strength in both hands.  She has a negative Hoffmann's test.  Good distal strength in general does have stocking distribution decreased sensation.  Skin:    General: Skin is warm and dry.     Findings: No erythema or rash.  Neurological:     General: No focal deficit present.     Mental Status: She is alert and oriented to person, place, and time.     Sensory: Sensory deficit present.     Motor: No abnormal muscle tone.     Coordination: Coordination normal.     Gait: Gait abnormal.  Psychiatric:        Mood and Affect: Mood normal.        Behavior: Behavior normal.        Thought Content: Thought content normal.     Ortho Exam Imaging: No results found.  Past Medical/Family/Surgical/Social History: Medications & Allergies reviewed per EMR, new medications updated. Patient Active Problem List   Diagnosis Date Noted  . Atrial flutter, paroxysmal ([place]) 06/02/2018  . Nonischemic cardiomyopathy ([place]) 06/02/2018  . History of cardioversion 06/02/2018  . Dehydration 12/20/2017  . Nausea & vomiting 12/20/2017  . Iron deficiency anemia 12/15/2016  . 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 Weight gain 04/08/2016  . Left knee pain 12/12/2015  . Lumbar facet arthropathy 08/05/2015  . Left leg weakness 08/05/2015  . Low back pain 08/05/2015  . Diabetic peripheral neuropathy ([place])  08/05/2015  . Bradykinesia 08/05/2015  . RBBB 07/30/2015  . Essential hypertension 07/30/2015  . Osteopenia 07/11/2015  . Cancer of right breast ([place]) 05/27/2015  . Breast cancer of upper-inner quadrant of right female breast ([place]) 04/24/2015  . Nausea vomiting and diarrhea 03/23/2015  . Controlled type 2 diabetes mellitus without complication, without long-term current use of insulin ([place]) 03/23/2015  . Dyslipidemia associated with type 2 diabetes mellitus ([place]) 03/23/2015  . 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 Long term current use of anticoagulant therapy 04/10/2014  . UTI (urinary tract infection) 04/10/2014  . Leukocytosis 03/14/2014  . Anemia in chronic illness 12/28/2013  .  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 Hypothyroidism 06/15/2010  . Paroxysmal atrial fibrillation ([place]) 05/28/2010   Past Medical History:  Diagnosis Date  . Acute on chronic combined systolic and diastolic CHF (congestive heart failure) ([place])   . Arthritis   . Asthma   . Atrial flutter ([facility])    PAROXYSMAL, EKG 10/06/2017, ATRIAL FLUTTER (ATYPICAL) WITH VARIABLE VENTRICULAR RESPONCE AND CONTROLLED  . Atrial flutter, paroxysmal ([place]) 06/02/2018  . Breast cancer, right ([place]) 2/17   R sided, s/p total mastectomy  . Bulging lumbar disc    3  . Chronic kidney disease    recurring UTI  . Complication of anesthesia    "morphine made me stop breathing"  . Concentric left ventricular hypertrophy    MILD DECREASE IN GLOBAL WALL MOTION. DOPPLER EVIDENCE OF GRADE II (PSEUDONORMAL) DIASTOLIC DYSFUNCTION, ELEVATED LAP, CALCULATED EF 45%, LEFT ATRIAL CAVITY IS MILDLY DILATED. MILD TO MODERATE AORTIC REGURGITATION. MILD (GRADE I) MITRAL REGURGITATION. MILD TRICUSPID REGURGITATION. ESTIMATED PULMONARY ARTERY SYSTOLIC PRESSURE 22 MMHG.  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 First degree AV block   . HCAP  (healthcare-associated pneumonia) 12/2013   Archie Endo 01/18/2014  . History of cardioversion 06/02/2018  . History of skin cancer   . History of transfusion   . Hypertension   . Hypothyroidism   . Left atrial enlargement   . Leg swelling   . 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 Malaise and fatigue   . Nocturia   . Nonischemic cardiomyopathy ([place]) 06/02/2018  . Paroxysmal atrial fibrillation ([facility])   . Persistent atrial fibrillation ([place])   . Pure hypercholesterolemia   . Shortness of breath dyspnea    "at times"  . Skin cancer   . Sleep apnea    uses c-pap  . Type II diabetes mellitus ([facility])    Family History  Problem Relation Age of Onset  . Stroke Father 65  . Heart attack Father 39  . Hypertension Father   . Heart disease Mother   . Arthritis Mother   . Breast cancer Mother 12  . Heart failure Mother   . Heart attack Brother   . Hypertension Brother   . Diabetes Sister   . Cancer Sister 36   Past Surgical History:  Procedure Laterality Date  . ABDOMINAL HYSTERECTOMY  1972   partial  . ABDOMINAL HYSTERECTOMY    . APPENDECTOMY  1953  . ATRIAL FIBRILLATION ABLATION  08/19/2015   PVI with CTI ablation by Dr Rayann Heman  . ATRIAL FIBRILLATION ABLATION N/A 04/14/2018   Procedure: ATRIAL FIBRILLATION ABLATION;  Surgeon: Thompson Grayer, MD;  Location: [facility];  Service: Cardiovascular;  Laterality: N/A;  . BACK SURGERY     x2  . BIOPSY  02/13/2018   Procedure: BIOPSY;  Surgeon: Ronnette Juniper, MD;  Location: [facility];  Service: Gastroenterology;;  X2=EGD & Colon  . BREAST SURGERY     Right mastectomy  . CARDIAC CATHETERIZATION  10/10/2008   Nonobstructive CAD  . CARDIOVERSION  04/13/2011   Procedure: CARDIOVERSION;  Surgeon: Laverda Page, MD;  Location: [facility];  Service: Cardiovascular;  Laterality: N/A;  . CARDIOVERSION N/A 06/18/2015   Procedure: CARDIOVERSION;  Surgeon: Adrian Prows, MD;  Location: [facility];  Service: Cardiovascular;  Laterality:  N/A;  . CARDIOVERSION N/A 10/26/2016   Procedure: CARDIOVERSION;  Surgeon: Adrian Prows, MD;  Location: [facility];  Service: Cardiovascular;  Laterality: N/A;  . CARDIOVERSION N/A 10/11/2017   Procedure: CARDIOVERSION;  Surgeon: Nigel Mormon, MD;  Location: [facility];  Service: Cardiovascular;  Laterality: N/A;  . CARDIOVERSION N/A 05/23/2018   Procedure: CARDIOVERSION;  Surgeon: Adrian Prows, MD;  Location: [facility];  Service: Cardiovascular;  Laterality: N/A;  . CATARACT EXTRACTION W/ INTRAOCULAR LENS  IMPLANT, BILATERAL Bilateral   . CHOLECYSTECTOMY  1993  . COLONOSCOPY N/A 02/13/2018   Procedure: COLONOSCOPY;  Surgeon: Ronnette Juniper, MD;  Location: [facility];  Service: Gastroenterology;  Laterality: N/A;  . ELECTROPHYSIOLOGIC STUDY N/A 08/19/2015   Procedure: Atrial Fibrillation Ablation;  Surgeon: Thompson Grayer, MD;  Location: [facility];  Service: Cardiovascular;  Laterality: N/A;  . ESOPHAGOGASTRODUODENOSCOPY N/A 02/13/2018   Procedure: ESOPHAGOGASTRODUODENOSCOPY (EGD);  Surgeon: Ronnette Juniper, MD;  Location: [facility];  Service: Gastroenterology;  Laterality: N/A;  . FOOT SURGERY    . IR REMOVAL TUN ACCESS W/ PORT W/O FL MOD SED  04/01/2017  . LUMBAR LAMINECTOMY    . MASS BIOPSY Left 12/07/2013   Procedure: EXCISIONAL BIOPSY LEFT NECK MASS ;  Surgeon: Jerrell Belfast, MD;  Location: [facility];  Service: ENT;  Laterality: Left;  Marland Kitchen MASTECTOMY Right 2017   malignant  . POLYPECTOMY  02/13/2018   Procedure: POLYPECTOMY;  Surgeon: Ronnette Juniper, MD;  Location: [facility];  Service: Gastroenterology;;  EGD  . PORTACATH PLACEMENT  12/2013   Ar[facility] Endo 01/04/2014  . PUBOVAGINAL SLING    . SKIN TAG REMOVAL  2012   leg  . TEE WITHOUT CARDIOVERSION N/A 08/19/2015   Procedure: TRANSESOPHAGEAL ECHOCARDIOGRAM (TEE);  Surgeon: Larey Dresser, MD;  Location: [facility];  Service: Cardiovascular;  Laterality: N/A;  . TONSILLECTOMY    . TOTAL MASTECTOMY Right 05/27/2015   Procedure:  RIGHT TOTAL MASTECTOMY;  Surgeon: Fanny Skates, MD;  Location: [facility];  Service: General;  Laterality: Right;   Social History   Occupational History  . Not on file  Tobacco Use  . Smoking status: Never Smoker  . Smokeless tobacco: Never Used  Substance and Sexual Activity  . Alcohol use: No    Alcohol/week: 0.0 standard drinks  . Drug use: No  . Sexual activity: Not Currently    Birth control/protection: Surgical

## 2019-02-13 ENCOUNTER — Other Ambulatory Visit: Payer: Self-pay | Admitting: Cardiology

## 2019-02-19 ENCOUNTER — Other Ambulatory Visit: Payer: Self-pay | Admitting: Physical Medicine and Rehabilitation

## 2019-02-19 ENCOUNTER — Telehealth: Payer: Self-pay | Admitting: *Deleted

## 2019-02-19 MED ORDER — GABAPENTIN 300 MG PO CAPS: 300.0000 mg | ORAL_CAPSULE | Freq: Two times a day (BID) | ORAL | 3 refills | Status: DC

## 2019-02-19 NOTE — Telephone Encounter (Signed)
Done

## 2019-03-14 ENCOUNTER — Other Ambulatory Visit: Payer: Self-pay

## 2019-03-14 ENCOUNTER — Ambulatory Visit (INDEPENDENT_AMBULATORY_CARE_PROVIDER_SITE_OTHER): Payer: Medicare Other | Admitting: Cardiology

## 2019-03-14 ENCOUNTER — Encounter: Payer: Self-pay | Admitting: Cardiology

## 2019-03-14 VITALS — BP 129/78 | HR 115 | Temp 97.8°F | Ht 66.5 in | Wt 155.2 lb

## 2019-03-14 DIAGNOSIS — E7800 Pure hypercholesterolemia, unspecified: Secondary | ICD-10-CM

## 2019-03-14 DIAGNOSIS — I484 Atypical atrial flutter: Secondary | ICD-10-CM

## 2019-03-14 DIAGNOSIS — E119 Type 2 diabetes mellitus without complications: Secondary | ICD-10-CM

## 2019-03-14 DIAGNOSIS — R0609 Other forms of dyspnea: Secondary | ICD-10-CM | POA: Diagnosis not present

## 2019-03-14 DIAGNOSIS — I10 Essential (primary) hypertension: Secondary | ICD-10-CM | POA: Diagnosis not present

## 2019-03-14 DIAGNOSIS — I480 Paroxysmal atrial fibrillation: Secondary | ICD-10-CM | POA: Diagnosis not present

## 2019-03-14 MED ORDER — METOPROLOL SUCCINATE ER 50 MG PO TB24: 50.0000 mg | ORAL_TABLET | Freq: Every day | ORAL | 2 refills | Status: DC

## 2019-03-14 NOTE — Progress Notes (Signed)
Primary Physician/Referring:  Deland Pretty, MD  Patient ID: Patty [name], female    DOB: 05/11/1940, 79 y.o.   MRN: [id number]  Chief Complaint  Patient presents with  . Atrial Fibrillation  . Follow-up    61mo  HPI:    Patty [name] is a 79y.o.  female  with  paroxysmal atrial flutter/atrial fibrillation, symptomatic with fatigue and dyspnea, has had ablation on 08/19/2015 and again on 04/14/2018 by Dr. JThompson Grayer back in atrial fibrillation in March 2024 which she underwent direct current cardioversion.   She has also had lymphoma which was treated in 2015 and is in remission, right breast cancer, status post lumpectomy in 2017 followed by chemotherapy therapy. She is presently on long-term anticoagulation. Medical history is also significant for controlled diabetes mellitus, hyperlipidemia, hypertension.  This is a six-month office visit, states that she is presently doing well and except for occasional fatigue and dyspnea with A. Fib onset, today she feels more fatigued and dyspneic and states she may be in AF. Comes on an off.  Her physical examination with PCP has been scheduled for August 2021 on annual basis.  6 months ago diabetes is well controlled including lipids which are well controlled.  Past Medical History:  Diagnosis Date  . Acute on chronic combined systolic and diastolic CHF (congestive heart failure) ([place]   . Arthritis   . Asthma   . Atrial flutter (HCC)    PAROXYSMAL, EKG 10/06/2017, ATRIAL FLUTTER (ATYPICAL) WITH VARIABLE VENTRICULAR RESPONCE AND CONTROLLED  . Atrial flutter, paroxysmal ([facility] 06/02/2018  . Breast cancer, right ([place] 2/17   R sided, s/p total mastectomy  . Bulging lumbar disc    3  . Chronic kidney disease    recurring UTI  . Complication of anesthesia    "morphine made me stop breathing"  . Concentric left ventricular hypertrophy    MILD DECREASE IN GLOBAL WALL MOTION. DOPPLER EVIDENCE OF GRADE II (PSEUDONORMAL) DIASTOLIC  DYSFUNCTION, ELEVATED LAP, CALCULATED EF 45%, LEFT ATRIAL CAVITY IS MILDLY DILATED. MILD TO MODERATE AORTIC REGURGITATION. MILD (GRADE I) MITRAL REGURGITATION. MILD TRICUSPID REGURGITATION. ESTIMATED PULMONARY ARTERY SYSTOLIC PRESSURE 22 MMHG.  .Marland Kitchen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)  . First degree AV block   . HCAP (healthcare-associated pneumonia) 12/2013   /Archie Endo11/20/2015  . History of cardioversion 06/02/2018  . History of skin cancer   . History of transfusion   . Hypertension   . Hypothyroidism   . Left atrial enlargement   . Leg swelling   . Lymphoma, non-Hodgkin's ([facility]    in remission  . Malaise and fatigue   . Nocturia   . Nonischemic cardiomyopathy ([facility] 06/02/2018  . Paroxysmal atrial fibrillation (HCC)   . Persistent atrial fibrillation ([facility]   . Pure hypercholesterolemia   . Shortness of breath dyspnea    "at times"  . Skin cancer   . Sleep apnea    uses c-pap  . Type II diabetes mellitus ([facility] [place]    Past Surgical History:  Procedure Laterality Date  . ABDOMINAL HYSTERECTOMY  1972   partial  . ABDOMINAL HYSTERECTOMY    . APPENDECTOMY  1953  . ATRIAL FIBRILLATION ABLATION  08/19/2015   PVI with CTI ablation by Dr ARayann Heman . ATRIAL FIBRILLATION ABLATION N/A 04/14/2018   Procedure: ATRIAL FIBRILLATION ABLATION;  Surgeon: AThompson Grayer MD;  [facility];  Service: Cardiovascular;  Laterality: N/A;  . BACK  SURGERY     x2  . BIOPSY  02/13/2018   Procedure: BIOPSY;  Surgeon: Ronnette Juniper, MD;  Location[facility];  Service: Gastroenterology;;  X2=EGD & Colon  . BREAST SURGERY     Right mastectomy  . CARDIAC CATHETERIZATION  10/10/2008   Nonobstructive CAD  . CARDIOVERSION  04/13/2011   Procedure: CARDIOVERSION;  Surgeon: Laverda Page, MD;  Location: [place];  Service: Cardiovascular;  Laterality: N/A;  . CARDIOVERSION N/A 06/18/2015   Procedure: CARDIOVERSION;  Surgeon: Adrian Prows, MD;  Location:  [facility];  Service: Cardiovascular;  Laterality: N/A;  . CARDIOVERSION N/A 10/26/2016   Procedure: CARDIOVERSION;  Surgeon: Adrian Prows, MD;  Location: [place];  Service: Cardiovascular;  Laterality: N/A;  . CARDIOVERSION N/A 10/11/2017   Procedure: CARDIOVERSION;  Surgeon: Nigel Mormon, MD;  Location[facility];  Service: Cardiovascular;  Laterality: N/A;  . CARDIOVERSION N/A 05/23/2018   Procedure: CARDIOVERSION;  Surgeon: Adrian Prows, MD;  Location: [place];  Service: Cardiovascular;  Laterality: N/A;  . CATARACT EXTRACTION W/ INTRAOCULAR LENS  IMPLANT, BILATERAL Bilateral   . CHOLECYSTECTOMY  1993  . COLONOSCOPY N/A 02/13/2018   Procedure: COLONOSCOPY;  Surgeon: Ronnette Juniper, MD;  [facility]: [facility];  Service: Gastroenterology;  Laterality: N/A;  . ELECTROPHYSIOLOGIC STUDY N/A 08/19/2015   Procedure: Atrial Fibrillation Ablation;  Surgeon: Thompson Grayer, MD;  Location: [place] [facility];  Service: Cardiovascular;  Laterality: N/A;  . ESOPHAGOGASTRODUODENOSCOPY N/A 02/13/2018   Procedure: ESOPHAGOGASTRODUODENOSCOPY (EGD);  Surgeon: Ronnette Juniper, MD;  Location: [facility];  Service: Gastroenterology;  Laterality: N/A;  . FOOT SURGERY    . IR REMOVAL TUN ACCESS W/ PORT W/O FL MOD SED  04/01/2017  . LUMBAR LAMINECTOMY    . MASS BIOPSY Left 12/07/2013   Procedure: EXCISIONAL BIOPSY LEFT NECK MASS ;  Surgeon: Jerrell Belfast, MD;  Location: [place];  Service: ENT;  Laterality: Left;  Marland Kitchen MASTECTOMY Right 2017   malignant  . POLYPECTOMY  02/13/2018   Procedure: POLYPECTOMY;  Surgeon: Ronnette Juniper, MD;  [facility]: [facility];  Service: Gastroenterology;;  EGD  . PORTACATH PLACEMENT  12/2013   Archie Endo 01/04/2014  . PUBOVAGINAL SLING    . SKIN TAG REMOVAL  2012   leg  . TEE WITHOUT CARDIOVERSION N/A 08/19/2015   Procedure: TRANSESOPHAGEAL ECHOCARDIOGRAM (TEE);  Surgeon: Larey Dresser, MD;  Location: [place];  Service: Cardiovascular;  Laterality: N/A;  . TONSILLECTOMY      . TOTAL MASTECTOMY Right 05/27/2015   Procedure: RIGHT TOTAL MASTECTOMY;  Surgeon: Fanny Skates, MD;  [facility]: [facility];  Service: General;  Laterality: Right;   Social History   Tobacco Use  . Smoking status: Never Smoker  . Smokeless tobacco: Never Used  Substance Use Topics  . Alcohol use: No    Alcohol/week: 0.0 standard drinks    ROS  Review of Systems  Constitution: Negative for weight gain.  Cardiovascular: Positive for dyspnea on exertion (mild and stable). Negative for leg swelling and syncope.  Respiratory: Negative for hemoptysis.   Endocrine: Negative for cold intolerance.  Hematologic/Lymphatic: Does not bruise/bleed easily.  Gastrointestinal: Negative for hematochezia and melena.  Neurological: Negative for headaches and light-headedness.   Objective  Blood pressure 129/78, pulse (!) 115, temperature 97.8 F (36.6 C), height 5' 6.5" (1.689 m), weight 155 lb 3.2 oz (70.4 kg), SpO2 98 %.  Vitals with BMI 03/14/2019 01/31/2019 11/24/2018  Height 5' 6.5" 5' 6.5" -  Weight 155 lbs 3 oz 140 lbs -  BMI 24.68 22.26 -  Systolic 527 782 423  Diastolic 78 74 71  Pulse 536 80 66     Physical Exam  Constitutional:  petite  Neck: No thyromegaly present.  Cardiovascular: Regular rhythm, normal heart sounds, intact distal pulses and normal pulses. Tachycardia present. Exam reveals no gallop.  No murmur heard. No leg edema, no JVD.  Pulmonary/Chest: Effort normal and breath sounds normal.  Abdominal: Soft. Bowel sounds are normal.  Musculoskeletal:     Cervical back: Neck supple.  Skin: Skin is warm and dry.   Laboratory examination:   Recent Labs    03/23/18 1000 05/17/18 1201 09/21/18 0930  NA 142 138 140  K 4.5 4.6 4.1  CL 105 103 105  CO2 '28 24 24  ' GLUCOSE 121* 136* 188*  BUN '9 17 18  ' CREATININE 0.70 0.89 0.93  CALCIUM 9.5 9.9 9.5  GFRNONAA >60 >60 58*  GFRAA >60 >60 >60   CrCl cannot be calculated (Patient's most recent lab result is older than the  maximum 21 days allowed.).  CMP Latest Ref Rng & Units 09/21/2018 05/17/2018 03/23/2018  Glucose 70 - 99 mg/dL 188(H) 136(H) 121(H)  BUN 8 - 23 mg/dL '18 17 9  ' Creatinine 0.44 - 1.00 mg/dL 0.93 0.89 0.70  Sodium 135 - 145 mmol/L 140 138 142  Potassium 3.5 - 5.1 mmol/L 4.1 4.6 4.5  Chloride 98 - 111 mmol/L 105 103 105  CO2 22 - 32 mmol/L '24 24 28  ' Calcium 8.9 - 10.3 mg/dL 9.5 9.9 9.5  Total Protein 6.5 - 8.1 g/dL 6.3(L) - 6.5  Total Bilirubin 0.3 - 1.2 mg/dL 0.5 - 0.5  Alkaline Phos 38 - 126 U/L 78 - 77  AST 15 - 41 U/L 18 - 16  ALT 0 - 44 U/L 17 - 13   CBC Latest Ref Rng & Units 12/29/2018 09/21/2018 05/17/2018  WBC 4.0 - 10.5 K/uL 8.3 6.1 9.4  Hemoglobin 12.0 - 15.0 g/dL 11.9(L) 10.5(L) 11.3(L)  Hematocrit 36.0 - 46.0 % 35.2(L) 32.0(L) 36.2  Platelets 150 - 400 K/uL 206 194 203   Lipid Panel     Component Value Date/Time   CHOL  05/05/2010 0200    128        ATP III CLASSIFICATION:  <200     mg/dL   Desirable  200-239  mg/dL   Borderline High  >=240    mg/dL   High          TRIG 163 (H) 05/05/2010 0200   HDL 44 05/05/2010 0200   CHOLHDL 2.9 05/05/2010 0200   VLDL 33 05/05/2010 0200   LDLCALC  05/05/2010 0200    51        Total Cholesterol/HDL:CHD Risk Coronary Heart Disease Risk Table                     Men   Women  1/2 Average Risk   3.4   3.3  Average Risk       5.0   4.4  2 X Average Risk   9.6   7.1  3 X Average Risk  23.4   11.0        Use the calculated Patient Ratio above and the CHD Risk Table to determine the patient's CHD Risk.        ATP III CLASSIFICATION (LDL):  <100     mg/dL   Optimal  100-129  mg/dL   Near or Above  Optimal  130-159  mg/dL   Borderline  160-189  mg/dL   High  >190     mg/dL   Very High   HEMOGLOBIN A1C Lab Results  Component Value Date   HGBA1C 5.9 (H) 05/23/2015   MPG 123 05/23/2015   TSH No results for input(s): TSH in the last 8760 hours.  Medications and allergies   Allergies  Allergen Reactions    . Morphine And Related Anaphylaxis and Other (See Comments)    Pt states she stopped breathing post op- "went into resp. arrest"  . Multaq [Dronedarone] Other (See Comments)    Reaction:  Blood in urine and elevated liver enzymes.   . Demerol Nausea And Vomiting  . Flecainide Other (See Comments)    Mobitz II AV Block  . Oysters [Shellfish Allergy] Rash    & CLAMS  . Penicillins Itching and Rash    Has patient had a PCN reaction causing immediate rash, facial/tongue/throat swelling, SOB or lightheadedness with hypotension: No Has patient had a PCN reaction causing severe rash involving mucus membranes or skin necrosis: Yes Has patient had a PCN reaction that required hospitalization No Has patient had a PCN reaction occurring within the last 10 years: No If all of the above answers are "NO", then may proceed with Cephalosporin use.   [facility] Cross Reactors Rash     Current Outpatient Medications  Medication Instructions  . albuterol (PROVENTIL HFA;VENTOLIN HFA) 108 (90 Base) MCG/ACT inhaler 1-2 puffs, Inhalation, Every 6 hours PRN  . anastrozole (ARIMIDEX) 1 mg, Oral, Daily  . B-12 2,500 mcg, Oral, Daily with lunch  . diltiazem (CARDIZEM CD) 120 MG 24 hr capsule TAKE 1 CAPSULE BY MOUTH EVERY DAY  . Fluticasone-Salmeterol (ADVAIR) 250-50 MCG/DOSE AEPB 1 puff, Inhalation, Every 12 hours  . gabapentin (NEURONTIN) 600 mg, Oral, 2 times daily  . levothyroxine (SYNTHROID) 200 mcg, Oral, Daily before breakfast  . Magnesium Oxide 500 mg, Oral, 2 times daily  . metFORMIN (GLUCOPHAGE-XR) 500 mg, Oral, 2 times daily with meals  . metoprolol succinate (TOPROL-XL) 50 mg, Oral, Daily, Take with or immediately following a meal.  . montelukast (SINGULAIR) 10 mg, Daily  . omega-3 acid ethyl esters (LOVAZA) 1 g, Oral, 2 times daily  . Prolia 60 mg, Intravenous, Every 6 months  . traMADol-acetaminophen (ULTRACET) 37.5-325 MG tablet 1 tablet, Oral, Every 6 hours PRN  .  triamterene-hydrochlorothiazide (MAXZIDE-25) 37.5-25 MG tablet 1 tablet, Oral, Daily PRN  . Vitamin D 2,000 Units, Daily with lunch  . XARELTO 20 MG TABS tablet TAKE 1 TABLET (20 MG TOTAL) BY MOUTH DAILY WITH SUPPER.    Radiology:  No results found.  Cardiac Studies:   Coronary angiogram 2010 (no stents-Dr. Peter Martinique)  Mild coronary calcification, Normal EF. Admitted to [facility] in March 2012 for A. Fibrillation with RVR. Stress cardiolite 3/12 [facility]) no ischemia .    Echocardiogram 11/09/2017: Left ventricle cavity is normal in size. Moderate concentric hypertrophy of the left ventricle. Mild decrease in global wall motion. Doppler evidence of grade II (pseudonormal) diastolic dysfunction, elevated LAP. Calculated EF 45%. Left atrial cavity is mildly dilated. Mild to moderate aortic regurgitation. Mild (Grade I) mitral regurgitation. Mild tricuspid regurgitation. Estimated pulmonary artery systolic pressure 22  mmHg.  Assessment     ICD-10-CM   1. Paroxysmal atrial fibrillation (HCC)  I48.0 EKG 12-Lead    TSH    CBC    PCV ECHOCARDIOGRAM COMPLETE  2. Atypical atrial flutter (HCC)  I48.4  metoprolol succinate (TOPROL-XL) 50 MG 24 hr tablet  3. Essential hypertension  I10 CMP14+EGFR    CBC  4. Controlled type 2 diabetes mellitus without complication, without long-term current use of insulin (HCC)  E11.9 Hgb A1c w/o eAG  5. Hypercholesteremia  E78.00 Lipid Panel With LDL/HDL Ratio  6. Dyspnea on exertion  R06.00 PCV ECHOCARDIOGRAM COMPLETE     (Unable to tolerate Multaq due to abnorma LFT. Flecainide caused hight degree AV Block. Consider Tikosyn if recurrent A. FIb)  EKG 03/13/2018: Atypical atrial flutter with 2:1 conduction at the rate of 109 bpm, left axis deviation, left anterior fascicular block.  Right bundle branch block.  Poor R-wave progression, cannot exclude anteroseptal infarct old.  Low voltage complexes.  Pulmonary disease pattern.  EKG 09/08/2018: SR   with first-degree AV block at the rate of 86 bpm, left axis deviation, left anterior fascicular block.  Right bundle branch block.  trifascicular block.  Low-voltage complexes. No significant change from  EKG 06/02/2018: Baseline artifact noted.  Sinus rhythm with first-degree AV block at rate of 75 bpm, PR interval 310 ms.   Meds ordered this encounter  Medications  . metoprolol succinate (TOPROL-XL) 50 MG 24 hr tablet    Sig: Take 1 tablet (50 mg total) by mouth daily. Take with or immediately following a meal.    Dispense:  30 tablet    Refill:  2    Medications Discontinued During This Encounter  Medication Reason  . gabapentin (NEURONTIN) 300 MG capsule Dose change     Recommendations:   Patty [name]  is a 79 y.o. female  with  paroxysmal atrial flutter/atrial fibrillation, symptomatic with fatigue and dyspnea, has had ablation on 08/19/2015 and again on 04/14/2018 by Dr. Thompson Grayer, back in atrial fibrillation in March 2024 which she underwent direct current cardioversion.   She has also had lymphoma which was treated in 2015 and is in remission, right breast cancer, status post lumpectomy in 2017 followed by chemotherapy therapy. She is presently on long-term anticoagulation. Medical history is also significant for controlled diabetes mellitus, hyperlipidemia, hypertension.  This is a six-month office visit, has occasional fatigue and dyspnea with A. Fib onset, today she feels more fatigued and dyspneic and states she may be in AF. Comes on an off. She is back in atrial flutter, I will add metoprolol succinate 25 mg daily and bring her back in 6 weeks for follow-up, the could also consider an event monitor to evaluate if it is persistent or paroxysmal.  Could repeat cardioversion.  I also ordered labs and echocardiogram.  Blood pressure is well controlled, she is tolerating anticoagulation without bleeding complications. She also has chronic anemia and used to get iron  transfusions in the past.  I will review her labs and make decisions regarding this.  Adrian Prows, MD, [facility] 03/14/2019, 10:54 AM [facility] [place] Office: [phone]

## 2019-03-15 DIAGNOSIS — R351 Nocturia: Secondary | ICD-10-CM | POA: Diagnosis not present

## 2019-03-15 DIAGNOSIS — N312 Flaccid neuropathic bladder, not elsewhere classified: Secondary | ICD-10-CM | POA: Diagnosis not present

## 2019-03-15 DIAGNOSIS — N3020 Other chronic cystitis without hematuria: Secondary | ICD-10-CM | POA: Diagnosis not present

## 2019-03-16 DIAGNOSIS — E119 Type 2 diabetes mellitus without complications: Secondary | ICD-10-CM | POA: Diagnosis not present

## 2019-03-16 DIAGNOSIS — I10 Essential (primary) hypertension: Secondary | ICD-10-CM | POA: Diagnosis not present

## 2019-03-16 DIAGNOSIS — I480 Paroxysmal atrial fibrillation: Secondary | ICD-10-CM | POA: Diagnosis not present

## 2019-03-16 DIAGNOSIS — E7800 Pure hypercholesterolemia, unspecified: Secondary | ICD-10-CM | POA: Diagnosis not present

## 2019-03-17 LAB — CMP14+EGFR
ALT: 27 IU/L (ref 0–32)
AST: 37 IU/L (ref 0–40)
Albumin/Globulin Ratio: 2.6 — ABNORMAL HIGH (ref 1.2–2.2)
Albumin: 4.7 g/dL (ref 3.7–4.7)
Alkaline Phosphatase: 112 IU/L (ref 39–117)
BUN/Creatinine Ratio: 23 (ref 12–28)
BUN: 22 mg/dL (ref 8–27)
Bilirubin Total: 0.6 mg/dL (ref 0.0–1.2)
CO2: 24 mmol/L (ref 20–29)
Calcium: 9.6 mg/dL (ref 8.7–10.3)
Chloride: 100 mmol/L (ref 96–106)
Creatinine, Ser: 0.96 mg/dL (ref 0.57–1.00)
GFR calc Af Amer: 65 mL/min/{1.73_m2} (ref >59)
GFR calc non Af Amer: 56 mL/min/{1.73_m2} — ABNORMAL LOW (ref >59)
Globulin, Total: 1.8 g/dL (ref 1.5–4.5)
Glucose: 117 mg/dL — ABNORMAL HIGH (ref 65–99)
Potassium: 5.1 mmol/L (ref 3.5–5.2)
Sodium: 139 mmol/L (ref 134–144)
Total Protein: 6.5 g/dL (ref 6.0–8.5)

## 2019-03-17 LAB — CBC
Hematocrit: 35.5 % (ref 34.0–46.6)
Hemoglobin: 12.2 g/dL (ref 11.1–15.9)
MCH: 32.9 pg (ref 26.6–33.0)
MCHC: 34.4 g/dL (ref 31.5–35.7)
MCV: 96 fL (ref 79–97)
Platelets: 215 10*3/uL (ref 150–450)
RBC: 3.71 x10E6/uL — ABNORMAL LOW (ref 3.77–5.28)
RDW: 13 % (ref 11.7–15.4)
WBC: 8.6 10*3/uL (ref 3.4–10.8)

## 2019-03-17 LAB — LIPID PANEL WITH LDL/HDL RATIO
Cholesterol, Total: 123 mg/dL (ref 100–199)
HDL: 37 mg/dL — ABNORMAL LOW (ref >39)
LDL Chol Calc (NIH): 67 mg/dL (ref 0–99)
LDL/HDL Ratio: 1.8 ratio (ref 0.0–3.2)
Triglycerides: 103 mg/dL (ref 0–149)
VLDL Cholesterol Cal: 19 mg/dL (ref 5–40)

## 2019-03-17 LAB — HGB A1C W/O EAG: Hgb A1c MFr Bld: 5.8 % — ABNORMAL HIGH (ref 4.8–5.6)

## 2019-03-17 LAB — TSH: TSH: 2.86 u[IU]/mL (ref 0.450–4.500)

## 2019-03-22 ENCOUNTER — Other Ambulatory Visit: Payer: Self-pay | Admitting: Internal Medicine

## 2019-03-22 DIAGNOSIS — Z1231 Encounter for screening mammogram for malignant neoplasm of breast: Secondary | ICD-10-CM

## 2019-03-23 ENCOUNTER — Other Ambulatory Visit: Payer: Self-pay

## 2019-03-23 ENCOUNTER — Emergency Department (HOSPITAL_COMMUNITY): Payer: Medicare Other

## 2019-03-23 ENCOUNTER — Inpatient Hospital Stay (HOSPITAL_COMMUNITY)
Admission: EM | Admit: 2019-03-23 | Discharge: 2019-03-24 | DRG: 690 | Disposition: A | Discharge status: Home / Self Care | Payer: Medicare Other | Attending: Cardiology | Admitting: Cardiology

## 2019-03-23 ENCOUNTER — Encounter (HOSPITAL_COMMUNITY): Payer: Self-pay | Admitting: Pharmacy Technician

## 2019-03-23 DIAGNOSIS — Z823 Family history of stroke: Secondary | ICD-10-CM | POA: Diagnosis not present

## 2019-03-23 DIAGNOSIS — Z91013 Allergy to seafood: Secondary | ICD-10-CM

## 2019-03-23 DIAGNOSIS — N390 Urinary tract infection, site not specified: Secondary | ICD-10-CM | POA: Diagnosis not present

## 2019-03-23 DIAGNOSIS — Z833 Family history of diabetes mellitus: Secondary | ICD-10-CM

## 2019-03-23 DIAGNOSIS — R0902 Hypoxemia: Secondary | ICD-10-CM | POA: Diagnosis not present

## 2019-03-23 DIAGNOSIS — Z79890 Hormone replacement therapy (postmenopausal): Secondary | ICD-10-CM

## 2019-03-23 DIAGNOSIS — Z8249 Family history of ischemic heart disease and other diseases of the circulatory system: Secondary | ICD-10-CM

## 2019-03-23 DIAGNOSIS — E1142 Type 2 diabetes mellitus with diabetic polyneuropathy: Secondary | ICD-10-CM | POA: Diagnosis present

## 2019-03-23 DIAGNOSIS — R531 Weakness: Secondary | ICD-10-CM | POA: Diagnosis not present

## 2019-03-23 DIAGNOSIS — I440 Atrioventricular block, first degree: Secondary | ICD-10-CM | POA: Diagnosis present

## 2019-03-23 DIAGNOSIS — N179 Acute kidney failure, unspecified: Secondary | ICD-10-CM

## 2019-03-23 DIAGNOSIS — Z7901 Long term (current) use of anticoagulants: Secondary | ICD-10-CM | POA: Diagnosis not present

## 2019-03-23 DIAGNOSIS — Z882 Allergy status to sulfonamides status: Secondary | ICD-10-CM

## 2019-03-23 DIAGNOSIS — Z7984 Long term (current) use of oral hypoglycemic drugs: Secondary | ICD-10-CM | POA: Diagnosis not present

## 2019-03-23 DIAGNOSIS — E1165 Type 2 diabetes mellitus with hyperglycemia: Secondary | ICD-10-CM | POA: Diagnosis not present

## 2019-03-23 DIAGNOSIS — E7800 Pure hypercholesterolemia, unspecified: Secondary | ICD-10-CM | POA: Diagnosis present

## 2019-03-23 DIAGNOSIS — Z853 Personal history of malignant neoplasm of breast: Secondary | ICD-10-CM | POA: Diagnosis not present

## 2019-03-23 DIAGNOSIS — I4891 Unspecified atrial fibrillation: Secondary | ICD-10-CM

## 2019-03-23 DIAGNOSIS — I480 Paroxysmal atrial fibrillation: Secondary | ICD-10-CM | POA: Diagnosis not present

## 2019-03-23 DIAGNOSIS — R55 Syncope and collapse: Secondary | ICD-10-CM | POA: Diagnosis not present

## 2019-03-23 DIAGNOSIS — J45909 Unspecified asthma, uncomplicated: Secondary | ICD-10-CM | POA: Diagnosis present

## 2019-03-23 DIAGNOSIS — Z880 Allergy status to penicillin: Secondary | ICD-10-CM

## 2019-03-23 DIAGNOSIS — R001 Bradycardia, unspecified: Secondary | ICD-10-CM | POA: Diagnosis not present

## 2019-03-23 DIAGNOSIS — Z8261 Family history of arthritis: Secondary | ICD-10-CM

## 2019-03-23 DIAGNOSIS — I5042 Chronic combined systolic (congestive) and diastolic (congestive) heart failure: Secondary | ICD-10-CM | POA: Diagnosis present

## 2019-03-23 DIAGNOSIS — Z7951 Long term (current) use of inhaled steroids: Secondary | ICD-10-CM

## 2019-03-23 DIAGNOSIS — E1122 Type 2 diabetes mellitus with diabetic chronic kidney disease: Secondary | ICD-10-CM | POA: Diagnosis present

## 2019-03-23 DIAGNOSIS — I4819 Other persistent atrial fibrillation: Secondary | ICD-10-CM | POA: Diagnosis present

## 2019-03-23 DIAGNOSIS — I130 Hypertensive heart and chronic kidney disease with heart failure and stage 1 through stage 4 chronic kidney disease, or unspecified chronic kidney disease: Secondary | ICD-10-CM | POA: Diagnosis present

## 2019-03-23 DIAGNOSIS — I499 Cardiac arrhythmia, unspecified: Secondary | ICD-10-CM | POA: Diagnosis not present

## 2019-03-23 DIAGNOSIS — I484 Atypical atrial flutter: Secondary | ICD-10-CM

## 2019-03-23 DIAGNOSIS — E039 Hypothyroidism, unspecified: Secondary | ICD-10-CM | POA: Diagnosis present

## 2019-03-23 DIAGNOSIS — N189 Chronic kidney disease, unspecified: Secondary | ICD-10-CM | POA: Diagnosis present

## 2019-03-23 DIAGNOSIS — Z85828 Personal history of other malignant neoplasm of skin: Secondary | ICD-10-CM | POA: Diagnosis not present

## 2019-03-23 DIAGNOSIS — N3000 Acute cystitis without hematuria: Secondary | ICD-10-CM | POA: Diagnosis not present

## 2019-03-23 DIAGNOSIS — R300 Dysuria: Secondary | ICD-10-CM

## 2019-03-23 DIAGNOSIS — Z20822 Contact with and (suspected) exposure to covid-19: Secondary | ICD-10-CM | POA: Diagnosis present

## 2019-03-23 DIAGNOSIS — I959 Hypotension, unspecified: Secondary | ICD-10-CM | POA: Diagnosis present

## 2019-03-23 DIAGNOSIS — I495 Sick sinus syndrome: Secondary | ICD-10-CM | POA: Diagnosis not present

## 2019-03-23 DIAGNOSIS — T447X5A Adverse effect of beta-adrenoreceptor antagonists, initial encounter: Secondary | ICD-10-CM

## 2019-03-23 DIAGNOSIS — I428 Other cardiomyopathies: Secondary | ICD-10-CM | POA: Diagnosis not present

## 2019-03-23 DIAGNOSIS — Z803 Family history of malignant neoplasm of breast: Secondary | ICD-10-CM

## 2019-03-23 DIAGNOSIS — Z79899 Other long term (current) drug therapy: Secondary | ICD-10-CM

## 2019-03-23 DIAGNOSIS — Z888 Allergy status to other drugs, medicaments and biological substances status: Secondary | ICD-10-CM

## 2019-03-23 DIAGNOSIS — R918 Other nonspecific abnormal finding of lung field: Secondary | ICD-10-CM | POA: Diagnosis not present

## 2019-03-23 DIAGNOSIS — Z9221 Personal history of antineoplastic chemotherapy: Secondary | ICD-10-CM

## 2019-03-23 DIAGNOSIS — T50904A Poisoning by unspecified drugs, medicaments and biological substances, undetermined, initial encounter: Secondary | ICD-10-CM | POA: Diagnosis not present

## 2019-03-23 DIAGNOSIS — Z885 Allergy status to narcotic agent status: Secondary | ICD-10-CM

## 2019-03-23 LAB — COMPREHENSIVE METABOLIC PANEL
ALT: 26 U/L (ref 0–44)
AST: 32 U/L (ref 15–41)
Albumin: 3.7 g/dL (ref 3.5–5.0)
Alkaline Phosphatase: 91 U/L (ref 38–126)
Anion gap: 12 (ref 5–15)
BUN: 24 mg/dL — ABNORMAL HIGH (ref 8–23)
CO2: 18 mmol/L — ABNORMAL LOW (ref 22–32)
Calcium: 8.9 mg/dL (ref 8.9–10.3)
Chloride: 105 mmol/L (ref 98–111)
Creatinine, Ser: 1.4 mg/dL — ABNORMAL HIGH (ref 0.44–1.00)
GFR calc Af Amer: 41 mL/min — ABNORMAL LOW (ref >60)
GFR calc non Af Amer: 35 mL/min — ABNORMAL LOW (ref >60)
Glucose, Bld: 205 mg/dL — ABNORMAL HIGH (ref 70–99)
Potassium: 4.4 mmol/L (ref 3.5–5.1)
Sodium: 135 mmol/L (ref 135–145)
Total Bilirubin: 0.7 mg/dL (ref 0.3–1.2)
Total Protein: 5.4 g/dL — ABNORMAL LOW (ref 6.5–8.1)

## 2019-03-23 LAB — I-STAT CHEM 8, ED
BUN: 23 mg/dL (ref 8–23)
Calcium, Ion: 1.16 mmol/L (ref 1.15–1.40)
Chloride: 106 mmol/L (ref 98–111)
Creatinine, Ser: 1.3 mg/dL — ABNORMAL HIGH (ref 0.44–1.00)
Glucose, Bld: 205 mg/dL — ABNORMAL HIGH (ref 70–99)
HCT: 31 % — ABNORMAL LOW (ref 36.0–46.0)
Hemoglobin: 10.5 g/dL — ABNORMAL LOW (ref 12.0–15.0)
Potassium: 4.4 mmol/L (ref 3.5–5.1)
Sodium: 137 mmol/L (ref 135–145)
TCO2: 19 mmol/L — ABNORMAL LOW (ref 22–32)

## 2019-03-23 LAB — TSH: TSH: 6.601 u[IU]/mL — ABNORMAL HIGH (ref 0.350–4.500)

## 2019-03-23 LAB — RESPIRATORY PANEL BY RT PCR (FLU A&B, COVID)
Influenza A by PCR: NEGATIVE
Influenza B by PCR: NEGATIVE
SARS Coronavirus 2 by RT PCR: NEGATIVE

## 2019-03-23 LAB — TROPONIN I (HIGH SENSITIVITY)
Troponin I (High Sensitivity): 12 ng/L (ref <18)
Troponin I (High Sensitivity): 17 ng/L (ref <18)

## 2019-03-23 LAB — T4, FREE: Free T4: >5.5 ng/dL — ABNORMAL HIGH (ref 0.61–1.12)

## 2019-03-23 LAB — CBC
HCT: 34.4 % — ABNORMAL LOW (ref 36.0–46.0)
Hemoglobin: 10.6 g/dL — ABNORMAL LOW (ref 12.0–15.0)
MCH: 32.5 pg (ref 26.0–34.0)
MCHC: 30.8 g/dL (ref 30.0–36.0)
MCV: 105.5 fL — ABNORMAL HIGH (ref 80.0–100.0)
Platelets: 192 10*3/uL (ref 150–400)
RBC: 3.26 MIL/uL — ABNORMAL LOW (ref 3.87–5.11)
RDW: 13.7 % (ref 11.5–15.5)
WBC: 12.6 10*3/uL — ABNORMAL HIGH (ref 4.0–10.5)
nRBC: 0 % (ref 0.0–0.2)

## 2019-03-23 LAB — MAGNESIUM: Magnesium: 1.8 mg/dL (ref 1.7–2.4)

## 2019-03-23 MED ORDER — VITAMIN D 25 MCG (1000 UNIT) PO TABS
2000.0000 [IU] | ORAL_TABLET | Freq: Every day | ORAL | Status: DC
  Filled 2019-03-23: qty 2

## 2019-03-23 MED ORDER — ANASTROZOLE 1 MG PO TABS
1.0000 mg | ORAL_TABLET | Freq: Every day | ORAL | Status: DC
  Filled 2019-03-23: qty 1

## 2019-03-23 MED ORDER — MONTELUKAST SODIUM 10 MG PO TABS: 10.0000 mg | ORAL_TABLET | Freq: Every day | ORAL | Status: DC

## 2019-03-23 MED ORDER — METFORMIN HCL ER 500 MG PO TB24
500.0000 mg | ORAL_TABLET | Freq: Two times a day (BID) | ORAL | Status: DC
  Filled 2019-03-23: qty 1

## 2019-03-23 MED ORDER — OMEGA-3-ACID ETHYL ESTERS 1 G PO CAPS
1.0000 g | ORAL_CAPSULE | Freq: Two times a day (BID) | ORAL | Status: DC
  Administered 2019-03-23: 1 g via ORAL
  Filled 2019-03-23: qty 1

## 2019-03-23 MED ORDER — ATROPINE SULFATE 1 MG/ML IJ SOLN
1.0000 mg | Freq: Once | INTRAMUSCULAR | Status: AC
  Administered 2019-03-23: 1 mg via INTRAVENOUS
  Filled 2019-03-23: qty 1

## 2019-03-23 MED ORDER — SODIUM CHLORIDE 0.9 % IV BOLUS
500.0000 mL | Freq: Once | INTRAVENOUS | Status: AC
  Administered 2019-03-23: 500 mL via INTRAVENOUS

## 2019-03-23 MED ORDER — VITAMIN B-12 100 MCG PO TABS: 100.0000 ug | ORAL_TABLET | Freq: Every day | ORAL | Status: DC

## 2019-03-23 MED ORDER — RIVAROXABAN 15 MG PO TABS: 15.0000 mg | ORAL_TABLET | Freq: Every day | ORAL | Status: DC

## 2019-03-23 MED ORDER — ACETAMINOPHEN 325 MG PO TABS: 650.0000 mg | ORAL_TABLET | ORAL | Status: DC | PRN

## 2019-03-23 MED ORDER — MAGNESIUM OXIDE 400 (241.3 MG) MG PO TABS
400.0000 mg | ORAL_TABLET | Freq: Two times a day (BID) | ORAL | Status: DC
  Administered 2019-03-23: 23:00:00 400 mg via ORAL
  Filled 2019-03-23: qty 1

## 2019-03-23 MED ORDER — MOMETASONE FURO-FORMOTEROL FUM 200-5 MCG/ACT IN AERO
2.0000 | INHALATION_SPRAY | Freq: Two times a day (BID) | RESPIRATORY_TRACT | Status: DC
  Filled 2019-03-23: qty 8.8

## 2019-03-23 MED ORDER — TRAMADOL-ACETAMINOPHEN 37.5-325 MG PO TABS: 1.0000 | ORAL_TABLET | Freq: Four times a day (QID) | ORAL | Status: DC | PRN

## 2019-03-23 MED ORDER — LEVOTHYROXINE SODIUM 100 MCG PO TABS
200.0000 ug | ORAL_TABLET | Freq: Every day | ORAL | Status: DC
  Administered 2019-03-24: 200 ug via ORAL
  Filled 2019-03-23: qty 2

## 2019-03-23 MED ORDER — B-12 2500 MCG PO TABS: 2500.0000 ug | ORAL_TABLET | Freq: Every day | ORAL | Status: DC

## 2019-03-23 MED ORDER — ONDANSETRON HCL 4 MG/2ML IJ SOLN: 4.0000 mg | Freq: Four times a day (QID) | INTRAMUSCULAR | Status: DC | PRN

## 2019-03-23 MED ORDER — ALBUTEROL SULFATE (2.5 MG/3ML) 0.083% IN NEBU: 2.5000 mg | INHALATION_SOLUTION | Freq: Four times a day (QID) | RESPIRATORY_TRACT | Status: DC | PRN

## 2019-03-23 MED ORDER — TRIAMTERENE-HCTZ 37.5-25 MG PO TABS: 1.0000 | ORAL_TABLET | Freq: Every day | ORAL | Status: DC | PRN

## 2019-03-23 MED ORDER — TRIAMTERENE-HCTZ 37.5-25 MG PO TABS
1.0000 | ORAL_TABLET | Freq: Every day | ORAL | Status: DC | PRN
  Filled 2019-03-23: qty 1

## 2019-03-23 MED ORDER — ALBUTEROL SULFATE HFA 108 (90 BASE) MCG/ACT IN AERS: 1.0000 | INHALATION_SPRAY | Freq: Four times a day (QID) | RESPIRATORY_TRACT | Status: DC | PRN

## 2019-03-23 MED ORDER — GABAPENTIN 600 MG PO TABS
600.0000 mg | ORAL_TABLET | Freq: Two times a day (BID) | ORAL | Status: DC
  Administered 2019-03-23: 600 mg via ORAL
  Filled 2019-03-23: qty 1

## 2019-03-23 NOTE — ED Notes (Signed)
Called to give report to 6E, spoke with secretary Butch Penny, nurse Caesar to return my call

## 2019-03-23 NOTE — ED Triage Notes (Signed)
Pt bib ems after near syncope. Pt endorsed feeling generalized weakness. Pt found to have HR as low as 20 and hypotensive in the 70's. Pt recently started on metoprolol. Takes cardizem for afib. Pt given 600cc fluid, glucagon and 0.5 atropine en route with no response. Pt pale on arrival.

## 2019-03-23 NOTE — ED Provider Notes (Signed)
[facility] EMERGENCY DEPARTMENT Provider Note   CSN: [id number] Arrival date & time: 03/23/19  [place]     History Chief Complaint  Patient presents with  . Near Syncope    ROSHANDRA MONLEY is a 79 y.o. female.  Patient with hx afib presents with general weakness, arrives by EMS. Patient notes acute onset feeling general weak today, at rest, symptoms severe, persistent, worse when standing. States felt that way to a milder extent the past few days since being started on metoprolol. Denies chest pain or discomfort. No sob. No abd pain or nvd. No fever or chills. Denies syncope. EMS notes hr as low as 20s, with mildly low bp en route. They gave glucagon and atropine with no change in rhythm.   The history is provided by the patient and the EMS personnel.  Near Syncope Pertinent negatives include no chest pain, no abdominal pain, no headaches and no shortness of breath.       Past Medical History:  Diagnosis Date  . Acute on chronic combined systolic and diastolic CHF (congestive heart failure) ([place])   . Arthritis   . Asthma   . Atrial flutter (HCC)    PAROXYSMAL, EKG 10/06/2017, ATRIAL FLUTTER (ATYPICAL) WITH VARIABLE VENTRICULAR RESPONCE AND CONTROLLED  . Atrial flutter, paroxysmal ([place]) 06/02/2018  . Breast cancer, right ([place]) 2/17   R sided, s/p total mastectomy  . Bulging lumbar disc    3  . Chronic kidney disease    recurring UTI  . Complication of anesthesia    "morphine made me stop breathing"  . Concentric left ventricular hypertrophy    MILD DECREASE IN GLOBAL WALL MOTION. DOPPLER EVIDENCE OF GRADE II (PSEUDONORMAL) DIASTOLIC DYSFUNCTION, ELEVATED LAP, CALCULATED EF 45%, LEFT ATRIAL CAVITY IS MILDLY DILATED. MILD TO MODERATE AORTIC REGURGITATION. MILD (GRADE I) MITRAL REGURGITATION. MILD TRICUSPID REGURGITATION. ESTIMATED PULMONARY ARTERY SYSTOLIC PRESSURE 22 MMHG.  Marland Kitchen Dyspnea and respiratory abnormality    DUE TO BRONCHIAL ASTHMA, (EMPHYSEMA DUE TO  ASTHMA)  . First degree AV block   . HCAP (healthcare-associated pneumonia) 12/2013   Archie Endo 01/18/2014  . History of cardioversion 06/02/2018  . History of skin cancer   . History of transfusion   . Hypertension   . Hypothyroidism   . Left atrial enlargement   . Leg swelling   . 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 Malaise and fatigue   . Nocturia   . Nonischemic cardiomyopathy ([place]) 06/02/2018  . Paroxysmal atrial fibrillation (HCC)   . Persistent atrial fibrillation ([place])   . Pure hypercholesterolemia   . Shortness of breath dyspnea    "at times"  . Skin cancer   . Sleep apnea    uses c-pap  . Type II diabetes mellitus [facility])     Patient Active Problem List   Diagnosis Date Noted  . Atrial flutter, paroxysmal ([place]) 06/02/2018  . Nonischemic cardiomyopathy ([place]) 06/02/2018  . History of cardioversion 06/02/2018  . Dehydration 12/20/2017  . Nausea & vomiting 12/20/2017  . Iron deficiency anemia 12/15/2016  . 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 Weight gain 04/08/2016  . Left knee pain 12/12/2015  . Lumbar facet arthropathy 08/05/2015  . Left leg weakness 08/05/2015  . Low back pain 08/05/2015  . Diabetic peripheral neuropathy ([place]) 08/05/2015  . Bradykinesia 08/05/2015  . RBBB 07/30/2015  . Essential hypertension 07/30/2015  . Osteopenia 07/11/2015  . Cancer of right breast ([place]) 05/27/2015  . Breast cancer of upper-inner quadrant of  right female breast ([place]) 04/24/2015  . Nausea vomiting and diarrhea 03/23/2015  . Controlled type 2 diabetes mellitus without complication, without long-term current use of insulin ([place]) 03/23/2015  . Dyslipidemia associated with type 2 diabetes mellitus ([place]) 03/23/2015  . 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 Long term current use of anticoagulant therapy 04/10/2014  . UTI (urinary tract infection) 04/10/2014  . Leukocytosis 03/14/2014  . Anemia in chronic illness 12/28/2013  . 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 Hypothyroidism 06/15/2010  . Paroxysmal atrial fibrillation ([place]) 05/28/2010    Past Surgical History:  Procedure Laterality Date  . ABDOMINAL HYSTERECTOMY  1972   partial  . ABDOMINAL HYSTERECTOMY    . APPENDECTOMY  1953  . ATRIAL FIBRILLATION ABLATION  08/19/2015   PVI with CTI ablation by Dr Rayann Heman  . ATRIAL FIBRILLATION ABLATION N/A 04/14/2018   Procedure: ATRIAL FIBRILLATION ABLATION;  Surgeon: Thompson Grayer, MD;  Location: [facility] CV LAB;  Service: Cardiovascular;  Laterality: N/A;  . BACK SURGERY     x2  . BIOPSY  02/13/2018   Procedure: BIOPSY;  Surgeon: Ronnette Juniper, MD;  Location: [facility];  Service: Gastroenterology;;  X2=EGD & Colon  . BREAST SURGERY     Right mastectomy  . CARDIAC CATHETERIZATION  10/10/2008   Nonobstructive CAD  . CARDIOVERSION  04/13/2011   Procedure: CARDIOVERSION;  Surgeon: Laverda Page, MD;  Location: [facility];  Service: Cardiovascular;  Laterality: N/A;  . CARDIOVERSION N/A 06/18/2015   Procedure: CARDIOVERSION;  Surgeon: Adrian Prows, MD;  Location: [facility] ENDOSCOPY;  Service: Cardiovascular;  Laterality: N/A;  . CARDIOVERSION N/A 10/26/2016   Procedure: CARDIOVERSION;  Surgeon: Adrian Prows, MD;  Location: [facility];  Service: Cardiovascular;  Laterality: N/A;  . CARDIOVERSION N/A 10/11/2017   Procedure: CARDIOVERSION;  Surgeon: Nigel Mormon, MD;  Location: [facility] ENDOSCOPY;  Service: Cardiovascular;  Laterality: N/A;  . CARDIOVERSION N/A 05/23/2018   Procedure: CARDIOVERSION;  Surgeon: Adrian Prows, MD;  Location: [facility];  Service: Cardiovascular;  Laterality: N/A;  . CATARACT EXTRACTION W/ INTRAOCULAR LENS  IMPLANT, BILATERAL Bilateral   . CHOLECYSTECTOMY  1993  . COLONOSCOPY N/A 02/13/2018   Procedure: COLONOSCOPY;  Surgeon: Ronnette Juniper, MD;  Location: [facility] ENDOSCOPY;  Service: Gastroenterology;  Laterality: N/A;  . ELECTROPHYSIOLOGIC STUDY N/A 08/19/2015   Procedure:  Atrial Fibrillation Ablation;  Surgeon: Thompson Grayer, MD;  Location: [facility] CV LAB;  Service: Cardiovascular;  Laterality: N/A;  . ESOPHAGOGASTRODUODENOSCOPY N/A 02/13/2018   Procedure: ESOPHAGOGASTRODUODENOSCOPY (EGD);  Surgeon: Ronnette Juniper, MD;  Location: [facility];  Service: Gastroenterology;  Laterality: N/A;  . FOOT SURGERY    . IR REMOVAL TUN ACCESS W/ PORT W/O FL MOD SED  04/01/2017  . LUMBAR LAMINECTOMY    . MASS BIOPSY Left 12/07/2013   Procedure: EXCISIONAL BIOPSY LEFT NECK MASS ;  Surgeon: Jerrell Belfast, MD;  Location: [facility];  Service: ENT;  Laterality: Left;  Marland Kitchen MASTECTOMY Right 2017   malignant  . POLYPECTOMY  02/13/2018   Procedure: POLYPECTOMY;  Surgeon: Ronnette Juniper, MD;  Location: [facility] ENDOSCOPY;  Service: Gastroenterology;;  EGD  . PORTACATH PLACEMENT  12/2013   Archie Endo 01/04/2014  . PUBOVAGINAL SLING    . SKIN TAG REMOVAL  2012   leg  . TEE WITHOUT CARDIOVERSION N/A 08/19/2015   Procedure: TRANSESOPHAGEAL ECHOCARDIOGRAM (TEE);  Surgeon: Larey [facility]er, MD;  Location: [facility];  Service: Cardiovascular;  Laterality: N/A;  . TONSILLECTOMY    . TOTAL MASTECTOMY Right  05/27/2015   Procedure: RIGHT TOTAL MASTECTOMY;  Surgeon: Fanny Skates, MD;  Location: [facility];  Service: General;  Laterality: Right;     OB History    Gravida  2   Para  2   Term  2   Preterm      AB      Living  2     SAB      TAB      Ectopic      Multiple      Live Births              Family History  Problem Relation Age of Onset  . Stroke Father 88  . Heart attack Father 74  . Hypertension Father   . Heart disease Mother   . Arthritis Mother   . Breast cancer Mother 58  . Heart failure Mother   . Heart attack Brother   . Hypertension Brother   . Diabetes Sister   . Cancer Sister 35    Social History   Tobacco Use  . Smoking status: Never Smoker  . Smokeless tobacco: Never Used  Substance Use Topics  . Alcohol use: No    Alcohol/week: 0.0 standard drinks   . Drug use: No    Home Medications Prior to Admission medications   Medication Sig Start Date End Date Taking? Authorizing Provider  albuterol (PROVENTIL HFA;VENTOLIN HFA) 108 (90 Base) MCG/ACT inhaler Inhale 1-2 puffs into the lungs every 6 (six) hours as needed for wheezing or shortness of breath.     [provider]  anastrozole (ARIMIDEX) 1 MG tablet Take 1 tablet (1 mg total) by mouth daily. 09/18/18   Heath Lark, MD  Cholecalciferol (VITAMIN D) 2000 UNITS tablet Take 2,000 Units by mouth daily with lunch.    [provider]  Cyanocobalamin (B-12) 2500 MCG TABS Take 2,500 mcg by mouth daily with lunch.     [provider]  diltiazem (CARDIZEM CD) 120 MG 24 hr capsule TAKE 1 CAPSULE BY MOUTH EVERY DAY 02/14/19   Adrian Prows, MD  Fluticasone-Salmeterol (ADVAIR) 250-50 MCG/DOSE AEPB Inhale 1 puff into the lungs every 12 (twelve) hours.     [provider]  gabapentin (NEURONTIN) 600 MG tablet Take 600 mg by mouth 2 (two) times daily. 02/26/19   [provider]  levothyroxine (SYNTHROID, LEVOTHROID) 200 MCG tablet Take 200 mcg by mouth daily before breakfast.    [provider]  Magnesium Oxide 500 MG TABS Take 500 mg by mouth 2 (two) times daily.     [provider]  metFORMIN (GLUCOPHAGE-XR) 500 MG 24 hr tablet Take 500 mg by mouth 2 (two) times daily with a meal.    [provider]  metoprolol succinate (TOPROL-XL) 50 MG 24 hr tablet Take 1 tablet (50 mg total) by mouth daily. Take with or immediately following a meal. 03/14/19 06/12/19  Adrian Prows, MD  montelukast (SINGULAIR) 10 MG tablet Take 10 mg by mouth daily.    [provider]  omega-3 acid ethyl esters (LOVAZA) 1 g capsule Take 1 g by mouth 2 (two) times daily.    [provider]  PROLIA 60 MG/ML SOSY injection Inject 60 mg into the vein every 6 (six) months. 07/18/17   [provider]  traMADol-acetaminophen (ULTRACET) 37.5-325 MG tablet  Take 1 tablet by mouth every 6 (six) hours as needed (pain).    [provider]  triamterene-hydrochlorothiazide (MAXZIDE-25) 37.5-25 MG tablet Take 1 tablet by mouth daily  as needed (for fluid retention/swelling.).  07/29/16   [provider]  XARELTO 20 MG TABS tablet TAKE 1 TABLET (20 MG TOTAL) BY MOUTH DAILY WITH SUPPER. 02/14/19   Adrian Prows, MD    Allergies    Morphine and related, Multaq [dronedarone], Demerol, Flecainide, Oysters [shellfish allergy], Penicillins, and Sulfa drugs cross reactors  Review of Systems   Review of Systems  Constitutional: Negative for fever.  HENT: Negative for sore throat.   Eyes: Negative for redness.  Respiratory: Negative for shortness of breath.   Cardiovascular: Positive for near-syncope. Negative for chest pain, palpitations and leg swelling.  Gastrointestinal: Negative for abdominal pain, diarrhea and vomiting.  Endocrine: Negative for polyuria.  Genitourinary: Negative for flank pain.  Musculoskeletal: Negative for back pain and neck pain.  Skin: Negative for rash.  Neurological: Positive for weakness and light-headedness. Negative for headaches.  Hematological: Does not bruise/bleed easily.  Psychiatric/Behavioral: Negative for confusion.    Physical Exam Updated Vital Signs There were no vitals taken for this visit.  Physical Exam Vitals and nursing note reviewed.  Constitutional:      Appearance: Normal appearance. She is well-developed.  HENT:     Head: Atraumatic.     Nose: Nose normal.     Mouth/Throat:     Mouth: Mucous membranes are moist.  Eyes:     General: No scleral icterus.    Conjunctiva/sclera: Conjunctivae normal.  Neck:     Trachea: No tracheal deviation.     Comments: Thyroid not grossly enlarged or tender.  Cardiovascular:     Rate and Rhythm: Regular rhythm. Bradycardia present.     Pulses: Normal pulses.     Heart sounds: Normal heart sounds. No murmur. No friction rub. No gallop.     Pulmonary:     Effort: Pulmonary effort is normal. No respiratory distress.     Breath sounds: Normal breath sounds.  Abdominal:     General: Bowel sounds are normal. There is no distension.     Palpations: Abdomen is soft.     Tenderness: There is no abdominal tenderness. There is no guarding.  Genitourinary:    Comments: No cva tenderness.  Musculoskeletal:        General: No swelling or tenderness.     Cervical back: Normal range of motion and neck supple. No rigidity. No muscular tenderness.  Skin:    General: Skin is warm and dry.     Findings: No rash.  Neurological:     Mental Status: She is alert.     Comments: Alert, speech normal.   Psychiatric:        Mood and Affect: Mood normal.     ED Results / Procedures / Treatments   Labs (all labs ordered are listed, but only abnormal results are displayed) Results for orders placed or performed during the hospital encounter of 03/23/19  Respiratory Panel by RT PCR (Flu A&B, Covid) - Nasopharyngeal Swab   Specimen: Nasopharyngeal Swab  Result Value Ref Range   SARS Coronavirus 2 by RT PCR NEGATIVE NEGATIVE   Influenza A by PCR NEGATIVE NEGATIVE   Influenza B by PCR NEGATIVE NEGATIVE  CBC  Result Value Ref Range   WBC 12.6 (H) 4.0 - 10.5 K/uL   RBC 3.26 (L) 3.87 - 5.11 MIL/uL   Hemoglobin 10.6 (L) 12.0 - 15.0 g/dL   HCT 34.4 (L) 36.0 - 46.0 %   MCV 105.5 (H) 80.0 - 100.0 fL   MCH 32.5 26.0 - 34.0 pg  MCHC 30.8 30.0 - 36.0 g/dL   RDW 13.7 11.5 - 15.5 %   Platelets 192 150 - 400 K/uL   nRBC 0.0 0.0 - 0.2 %  Comprehensive metabolic panel  Result Value Ref Range   Sodium 135 135 - 145 mmol/L   Potassium 4.4 3.5 - 5.1 mmol/L   Chloride 105 98 - 111 mmol/L   CO2 18 (L) 22 - 32 mmol/L   Glucose, Bld 205 (H) 70 - 99 mg/dL   BUN 24 (H) 8 - 23 mg/dL   Creatinine, Ser 1.40 (H) 0.44 - 1.00 mg/dL   Calcium 8.9 8.9 - 10.3 mg/dL   Total Protein 5.4 (L) 6.5 - 8.1 g/dL   Albumin 3.7 3.5 - 5.0 g/dL   AST 32 15 - 41 U/L    ALT 26 0 - 44 U/L   Alkaline Phosphatase 91 38 - 126 U/L   Total Bilirubin 0.7 0.3 - 1.2 mg/dL   GFR calc non Af Amer 35 (L) >60 mL/min   GFR calc Af Amer 41 (L) >60 mL/min   Anion gap 12 5 - 15  Magnesium  Result Value Ref Range   Magnesium 1.8 1.7 - 2.4 mg/dL  TSH  Result Value Ref Range   TSH 6.601 (H) 0.350 - 4.500 uIU/mL  I-stat chem 8, ED (not at [facility] or [facility])  Result Value Ref Range   Sodium 137 135 - 145 mmol/L   Potassium 4.4 3.5 - 5.1 mmol/L   Chloride 106 98 - 111 mmol/L   BUN 23 8 - 23 mg/dL   Creatinine, Ser 1.30 (H) 0.44 - 1.00 mg/dL   Glucose, Bld 205 (H) 70 - 99 mg/dL   Calcium, Ion 1.16 1.15 - 1.40 mmol/L   TCO2 19 (L) 22 - 32 mmol/L   Hemoglobin 10.5 (L) 12.0 - 15.0 g/dL   HCT 31.0 (L) 36.0 - 46.0 %  Troponin I (High Sensitivity)  Result Value Ref Range   Troponin I (High Sensitivity) 12 <18 ng/L   DG Chest Port 1 View  Result Date: 03/23/2019 CLINICAL DATA:  Weakness EXAM: PORTABLE CHEST 1 VIEW COMPARISON:  10/18/2018 FINDINGS: Right lung is clear. Possible airspace disease at the left base though partially obscured by overlying pacer patches. Borderline cardiomegaly. Aortic atherosclerosis. No pneumothorax. IMPRESSION: Hazy left lung base opacity, atelectasis versus mild infiltrate, this region is partially obscured by overlying support devices Electronically Signed   By: Donavan Foil M.D.   On: 03/23/2019 19:17    EKG EKG Interpretation  Date/Time:  Friday March 23 2019 18:38:13 EST Ventricular Rate:  48 PR Interval:    QRS Duration: 101 QT Interval:  466 QTC Calculation: 335 R Axis:   -94 Text Interpretation: Atrial fibrillation with slow ventricular response Low voltage QRS Confirmed by Lajean Saver [phone]) on 03/23/2019 6:48:40 PM   Radiology DG Chest Port 1 View  Result Date: 03/23/2019 CLINICAL DATA:  Weakness EXAM: PORTABLE CHEST 1 VIEW COMPARISON:  10/18/2018 FINDINGS: Right lung is clear. Possible airspace disease at the left base though  partially obscured by overlying pacer patches. Borderline cardiomegaly. Aortic atherosclerosis. No pneumothorax. IMPRESSION: Hazy left lung base opacity, atelectasis versus mild infiltrate, this region is partially obscured by overlying support devices Electronically Signed   By: Donavan Foil M.D.   On: 03/23/2019 19:17    Procedures Procedures (including critical care time)  Medications Ordered in ED Medications - No data to display  ED Course  I have reviewed the triage vital signs and the  nursing notes.  Pertinent labs & imaging results that were available during my care of the patient were reviewed by me and considered in my medical decision making (see chart for details).    MDM Rules/Calculators/A&P                      Iv ns. Continuous pulse ox and monitor. O2. Stat labs, ecg and portable cxr.   Hr in 83s. Atropine iv. Cardiologist emergently consulted. External pacer pads applied.   Reviewed nursing notes and prior charts for additional history.   Labs reviewed/interpreted by me - mild aki compared to baseline. Iv ns bolus.   CXR reviewed/interpreted by me - no pna.   With ns boluses, atropine in ED, recheck hr mid 40's, bp is improved into 90s.   Recheck pt, serial exams, and continuous cardiac monitoring - no chest pain. No sob. Still generally weak feeling.  No fever or chills.   Discussed with Dr Einar Gip, cardiology - he will see/admit.   Additional labs reviewed/interpreted by me - covid is negative. Trop is normal.  tsh is elevated, t4 remains pending - ?hypothyroid, await t4 - admitted team to f/u and address.   CRITICAL CARE  RE: acute/severe weakness, presyncope, with atrial fibrillation with profound bradycardia and hypotension  Performed by: Mirna Mires Total critical care time: 80 minutes Critical care time was exclusive of separately billable procedures and treating other patients. Critical care was necessary to treat or prevent imminent or  life-threatening deterioration. Critical care was time spent personally by me on the following activities: development of treatment plan with patient and/or surrogate as well as nursing, discussions with consultants, evaluation of patient's response to treatment, examination of patient, obtaining history from patient or surrogate, ordering and performing treatments and interventions, ordering and review of laboratory studies, ordering and review of radiographic studies, pulse oximetry and re-evaluation of patient's condition.   Final Clinical Impression(s) / ED Diagnoses Final diagnoses:  None    Rx / DC Orders ED Discharge Orders    None       Lajean Saver, MD 03/23/19 2032

## 2019-03-24 DIAGNOSIS — I495 Sick sinus syndrome: Secondary | ICD-10-CM | POA: Diagnosis not present

## 2019-03-24 DIAGNOSIS — N3000 Acute cystitis without hematuria: Secondary | ICD-10-CM | POA: Diagnosis not present

## 2019-03-24 DIAGNOSIS — E039 Hypothyroidism, unspecified: Secondary | ICD-10-CM | POA: Diagnosis not present

## 2019-03-24 DIAGNOSIS — Z853 Personal history of malignant neoplasm of breast: Secondary | ICD-10-CM | POA: Diagnosis not present

## 2019-03-24 DIAGNOSIS — I959 Hypotension, unspecified: Secondary | ICD-10-CM | POA: Diagnosis not present

## 2019-03-24 DIAGNOSIS — Z7901 Long term (current) use of anticoagulants: Secondary | ICD-10-CM | POA: Diagnosis not present

## 2019-03-24 DIAGNOSIS — R001 Bradycardia, unspecified: Secondary | ICD-10-CM | POA: Diagnosis not present

## 2019-03-24 DIAGNOSIS — I480 Paroxysmal atrial fibrillation: Secondary | ICD-10-CM | POA: Diagnosis not present

## 2019-03-24 DIAGNOSIS — Z85828 Personal history of other malignant neoplasm of skin: Secondary | ICD-10-CM | POA: Diagnosis not present

## 2019-03-24 DIAGNOSIS — Z7984 Long term (current) use of oral hypoglycemic drugs: Secondary | ICD-10-CM | POA: Diagnosis not present

## 2019-03-24 LAB — URINALYSIS, ROUTINE W REFLEX MICROSCOPIC
Bilirubin Urine: NEGATIVE
Glucose, UA: NEGATIVE mg/dL
Hgb urine dipstick: NEGATIVE
Ketones, ur: NEGATIVE mg/dL
Nitrite: NEGATIVE
Protein, ur: 100 mg/dL — AB
RBC / HPF: >50 RBC/hpf — ABNORMAL HIGH (ref 0–5)
Specific Gravity, Urine: 1.014 (ref 1.005–1.030)
WBC, UA: >50 WBC/hpf — ABNORMAL HIGH (ref 0–5)
pH: 6 (ref 5.0–8.0)

## 2019-03-24 MED ORDER — NITROFURANTOIN MONOHYD MACRO 100 MG PO CAPS: 100.0000 mg | ORAL_CAPSULE | Freq: Two times a day (BID) | ORAL | 0 refills | Status: AC

## 2019-03-24 MED ORDER — METOPROLOL SUCCINATE ER 25 MG PO TB24: 25.0000 mg | ORAL_TABLET | Freq: Every day | ORAL | 2 refills | Status: DC

## 2019-03-24 NOTE — Care Management (Signed)
Attempted to deliver CC44, as notified by UR.  Patient not in room or on unit.  Per RN, patient left unit around 8:40.

## 2019-03-24 NOTE — Discharge Instructions (Signed)
Bradycardia, Adult Bradycardia is a slower-than-normal heartbeat. A normal resting heart rate for an adult ranges from 60 to 100 beats per minute. With bradycardia, the resting heart rate is less than 60 beats per minute. Bradycardia can prevent enough oxygen from reaching certain areas of your body when you are active. It can be serious if it keeps enough oxygen from reaching your brain and other parts of your body. Bradycardia is not a problem for everyone. For some healthy adults, a slow resting heart rate is normal. What are the causes? This condition may be caused by:  A problem with the heart, including: ? A problem with the heart's electrical system, such as a heart block. With a heart block, electrical signals between the chambers of the heart are partially or completely blocked, so they are not able to work as they should. ? A problem with the heart's natural pacemaker (sinus node). ? Heart disease. ? A heart attack. ? Heart damage. ? Lyme disease. ? A heart infection. ? A heart condition that is present at birth (congenital heart defect).  Certain medicines that treat heart conditions.  Certain conditions, such as hypothyroidism and obstructive sleep apnea.  Problems with the balance of chemicals and other substances, like potassium, in the blood.  Trauma.  Radiation therapy. What increases the risk? You are more likely to develop this condition if you:  Are age 65 or older.  Have high blood pressure (hypertension), high cholesterol (hyperlipidemia), or diabetes.  Drink heavily, use tobacco or nicotine products, or use drugs. What are the signs or symptoms? Symptoms of this condition include:  Light-headedness.  Feeling faint or fainting.  Fatigue and weakness.  Trouble with activity or exercise.  Shortness of breath.  Chest pain (angina).  Drowsiness.  Confusion.  Dizziness. How is this diagnosed? This condition may be diagnosed based on:  Your  symptoms.  Your medical history.  A physical exam. During the exam, your health care provider will listen to your heartbeat and check your pulse. To confirm the diagnosis, your health care provider may order tests, such as:  Blood tests.  An electrocardiogram (ECG). This test records the heart's electrical activity. The test can show how fast your heart is beating and whether the heartbeat is steady.  A test in which you wear a portable device (event recorder or Holter monitor) to record your heart's electrical activity while you go about your day.  Anexercise test. How is this treated? Treatment for this condition depends on the cause of the condition and how severe your symptoms are. Treatment may involve:  Treatment of the underlying condition.  Changing your medicines or how much medicine you take.  Having a small, battery-operated device called a pacemaker implanted under the skin. When bradycardia occurs, this device can be used to increase your heart rate and help your heart beat in a regular rhythm. Follow these instructions at home: Lifestyle   Manage any health conditions that contribute to bradycardia as told by your health care provider.  Follow a heart-healthy diet. A nutrition specialist (dietitian) can help educate you about healthy food options and changes.  Follow an exercise program that is approved by your health care provider.  Maintain a healthy weight.  Try to reduce or manage your stress, such as with yoga or meditation. If you need help reducing stress, ask your health care provider.  Do not use any products that contain nicotine or tobacco, such as cigarettes, e-cigarettes, and chewing tobacco. If you need help   quitting, ask your health care provider.  Do not use illegal drugs.  Limit alcohol intake to no more than 1 drink a day for nonpregnant women and 2 drinks a day for men. Be aware of how much alcohol is in your drink. In the [place] one drink  equals one 12 oz bottle of beer (355 mL), one 5 oz glass of wine (148 mL), or one 1 oz glass of hard liquor (44 mL). General instructions  Take over-the-counter and prescription medicines only as told by your health care provider.  Keep all follow-up visits as told by your health care provider. This is important. How is this prevented? In some cases, bradycardia may be prevented by:  Treating underlying medical problems.  Stopping behaviors or medicines that can trigger the condition. Contact a health care provider if you:  Feel light-headed or dizzy.  Almost faint.  Feel weak or are easily fatigued during physical activity.  Experience confusion or have memory problems. Get help right away if:  You faint.  You have: ? An irregular heartbeat (palpitations). ? Chest pain. ? Trouble breathing. Summary  Bradycardia is a slower-than-normal heartbeat. With bradycardia, the resting heart rate is less than 60 beats per minute.  Treatment for this condition depends on the cause.  Manage any health conditions that contribute to bradycardia as told by your health care provider.  Do not use any products that contain nicotine or tobacco, such as cigarettes, e-cigarettes, and chewing tobacco, and limit alcohol intake.  Keep all follow-up visits as told by your health care provider. This is important. This information is not intended to replace advice given to you by your health care provider. Make sure you discuss any questions you have with your health care provider. Document Revised: 08/29/2017 Document Reviewed: 07/27/2017 Elsevier Patient Education  2020 [employer]  

## 2019-03-24 NOTE — Plan of Care (Signed)
Pt discharged to home with self care, education complete, left facility with daughter via private vehicle

## 2019-03-24 NOTE — Discharge Summary (Signed)
5 Please see HPI done just a few minutes ago, patient presented with marked fatigue and dizziness and low blood pressure and was found to be bradycardic and in A. fib with slow ventricular response, admitted overnight, converted to sinus rhythm, symptoms improved with IV fluids.  Urine analysis abnormal suggestive of UTI with dysuria and frequency.  Started on Macrobid and discharged home.  Metoprolol succinate dose reduced from 50 mg to 25 mg. Allergies as of 03/24/2019      Reactions   Morphine And Related Anaphylaxis, Other (See Comments)   Pt states she stopped breathing post op- "went into resp. arrest"   Multaq [dronedarone] Other (See Comments)   Reaction:  Blood in urine and elevated liver enzymes.    Demerol Nausea And Vomiting   Flecainide Other (See Comments)   Mobitz II AV Block   Oysters [shellfish Allergy] Rash   & CLAMS   Penicillins Itching, Rash   Has patient had a PCN reaction causing immediate rash, facial/tongue/throat swelling, SOB or lightheadedness with hypotension: No Has patient had a PCN reaction causing severe rash involving mucus membranes or skin necrosis: Yes Has patient had a PCN reaction that required hospitalization No Has patient had a PCN reaction occurring within the last 10 years: No If all of the above answers are "NO", then may proceed with Cephalosporin use.   Sulfa Drugs Cross Reactors Rash      Medication List    TAKE these medications   albuterol 108 (90 Base) MCG/ACT inhaler Commonly known as: VENTOLIN HFA Inhale 1-2 puffs into the lungs every 6 (six) hours as needed for wheezing or shortness of breath.   anastrozole 1 MG tablet Commonly known as: ARIMIDEX Take 1 tablet (1 mg total) by mouth daily.   B-12 2500 MCG Tabs Take 2,500 mcg by mouth daily with lunch.   diltiazem 120 MG 24 hr capsule Commonly known as: CARDIZEM CD TAKE 1 CAPSULE BY MOUTH EVERY DAY What changed: how much to take   Fluticasone-Salmeterol 250-50 MCG/DOSE  Aepb Commonly known as: ADVAIR Inhale 1 puff into the lungs every 12 (twelve) hours.   gabapentin 600 MG tablet Commonly known as: NEURONTIN Take 600 mg by mouth 2 (two) times daily.   levothyroxine 200 MCG tablet Commonly known as: SYNTHROID Take 200 mcg by mouth daily before breakfast.   Magnesium Oxide 500 MG Tabs Take 500 mg by mouth 2 (two) times daily.   metFORMIN 500 MG 24 hr tablet Commonly known as: GLUCOPHAGE-XR Take 500 mg by mouth 2 (two) times daily with a meal.   metoprolol succinate 25 MG 24 hr tablet Commonly known as: TOPROL-XL Take 1 tablet (25 mg total) by mouth daily. What changed:   medication strength  how much to take  additional instructions   montelukast 10 MG tablet Commonly known as: SINGULAIR Take 10 mg by mouth daily.   nitrofurantoin (macrocrystal-monohydrate) 100 MG capsule Commonly known as: Macrobid Take 1 capsule (100 mg total) by mouth 2 (two) times daily for 7 days.   omega-3 acid ethyl esters 1 g capsule Commonly known as: LOVAZA Take 1 g by mouth 2 (two) times daily.   Prolia 60 MG/ML Sosy injection Generic drug: denosumab Inject 60 mg into the vein every 6 (six) months.   traMADol-acetaminophen 37.5-325 MG tablet Commonly known as: ULTRACET Take 1 tablet by mouth every 6 (six) hours as needed (pain).   triamterene-hydrochlorothiazide 37.5-25 MG tablet Commonly known as: MAXZIDE-25 Take 1 tablet by mouth daily as needed (for  fluid retention/swelling.).   Vitamin D 50 MCG (2000 UT) tablet Take 2,000 Units by mouth daily with lunch.   Xarelto 20 MG Tabs tablet Generic drug: rivaroxaban TAKE 1 TABLET (20 MG TOTAL) BY MOUTH DAILY WITH SUPPER. What changed: See the new instructions.

## 2019-03-24 NOTE — H&P (Signed)
CARDIOLOGY ADMIT NOTE   Patient ID: Patty [name] MRN: [id number] DOB/AGE: 03-13-1940 79 y.o.  Admit date: 03/23/2019 Primary Physician:  Deland Pretty, MD  Patient ID: Patty [name], female    DOB: Oct 14, 1940, 79 y.o.   MRN: [id number]  Chief Complaint  Patient presents with  . Near Syncope   HPI:    Patty [name]  is a 79 y.o. female  with   paroxysmal atrial flutter/atrial fibrillation, symptomatic with fatigue and dyspnea, has had ablation on 08/19/2015 and again on 04/14/2018 by Dr. Thompson Grayer, back in atrial fibrillation in March 2020 which she underwent direct current cardioversion.   She has also had lymphoma which was treated in 2015 and is in remission, right breast cancer, status post lumpectomy in 2017 followed by chemotherapy therapy. She is presently on long-term anticoagulation. Medical history is also significant for controlled diabetes mellitus, hyperlipidemia, hypertension.  She was seen in the office on 03/14/2019 with atrial fibrillation/atrial flutter with RVR and was started on metoprolol succinate 50 mg daily.  She was also scheduled for direct-current cardioversion.  She was at home yesterday when she felt extremely weak and fatigued and EMS was activated and brought to the emergency room where she was found to be in slow A. fib and borderline low blood pressure and received IV fluids.  She was then admitted to the hospital for further evaluation.  This morning patient states that she has started to feel better and essentially asymptomatic.  Past Medical History:  Diagnosis Date  . Acute on chronic combined systolic and diastolic CHF (congestive heart failure) ([place])   . Arthritis   . Asthma   . Atrial flutter (H[facility])    PAROXYSMAL, EKG 10/06/2017, ATRIAL FLUTTER (ATYPICAL) WITH VARIABLE VENTRICULAR RESPONCE AND CONTROLLED  . Atrial flutter, paroxysmal ([place]) 06/02/2018  . Breast cancer, right ([place]) 2/17   R sided, s/p total mastectomy  . Bulging lumbar  disc    3  . Chronic kidney disease    recurring UTI  . Complication of anesthesia    "morphine made me stop breathing"  . Concentric left ventricular hypertrophy    MILD DECREASE IN GLOBAL WALL MOTION. DOPPLER EVIDENCE OF GRADE II (PSEUDONORMAL) DIASTOLIC DYSFUNCTION, ELEVATED LAP, CALCULATED EF 45%, LEFT ATRIAL CAVITY IS MILDLY DILATED. MILD TO MODERATE AORTIC REGURGITATION. MILD (GRADE I) MITRAL REGURGITATION. MILD TRICUSPID REGURGITATION. ESTIMATED PULMONARY ARTERY SYSTOLIC PRESSURE 22 MMHG.  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 First degree AV block   . HCAP (healthcare-associated pneumonia) 12/2013   Archie Endo 01/18/2014  . History of cardioversion 06/02/2018  . History of skin cancer   . History of transfusion   . Hypertension   . Hypothyroidism   . Left atrial enlargement   . Leg swelling   . 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 Malaise and fatigue   . Nocturia   . Nonischemic cardiomyopathy ([place]) 06/02/2018  . Paroxysmal atrial fibrillation (HCC)   . Persistent atrial fibrillation ([place])   . Pure hypercholesterolemia   . Shortness of breath dyspnea    "at times"  . Skin cancer   . Sleep apnea    uses c-pap  . Type II diabetes mellitus ([place])    Past Surgical History:  Procedure Laterality Date  . ABDOMINAL HYSTERECTOMY  1972   partial  . ABDOMINAL HYSTERECTOMY    . APPENDECTOMY  1953  . ATRIAL FIBRILLATION ABLATION  08/19/2015  PVI with CTI ablation by Dr Rayann Heman  . ATRIAL FIBRILLATION ABLATION N/A 04/14/2018   Procedure: ATRIAL FIBRILLATION ABLATION;  Surgeon: Thompson Grayer, MD;  Location: [facility];  Service: Cardiovascular;  Laterality: N/A;  . BACK SURGERY     x2  . BIOPSY  02/13/2018   Procedure: BIOPSY;  Surgeon: Ronnette Juniper, MD;  Location: [facility];  Service: Gastroenterology;;  X2=EGD & Colon  . BREAST SURGERY     Right mastectomy  . CARDIAC CATHETERIZATION  10/10/2008    Nonobstructive CAD  . CARDIOVERSION  04/13/2011   Procedure: CARDIOVERSION;  Surgeon: Laverda Page, MD;  Location: [facility];  Service: Cardiovascular;  Laterality: N/A;  . CARDIOVERSION N/A 06/18/2015   Procedure: CARDIOVERSION;  Surgeon: Adrian Prows, MD;  Location: [facility];  Service: Cardiovascular;  Laterality: N/A;  . CARDIOVERSION N/A 10/26/2016   Procedure: CARDIOVERSION;  Surgeon: Adrian Prows, MD;  Location: [facility];  Service: Cardiovascular;  Laterality: N/A;  . CARDIOVERSION N/A 10/11/2017   Procedure: CARDIOVERSION;  Surgeon: Nigel Mormon, MD;  Location: [facility];  Service: Cardiovascular;  Laterality: N/A;  . CARDIOVERSION N/A 05/23/2018   Procedure: CARDIOVERSION;  Surgeon: Adrian Prows, MD;  Location: [facility];  Service: Cardiovascular;  Laterality: N/A;  . CATARACT EXTRACTION W/ INTRAOCULAR LENS  IMPLANT, BILATERAL Bilateral   . CHOLECYSTECTOMY  1993  . COLONOSCOPY N/A 02/13/2018   Procedure: COLONOSCOPY;  Surgeon: Ronnette Juniper, MD;  Location: [facility];  Service: Gastroenterology;  Laterality: N/A;  . ELECTROPHYSIOLOGIC STUDY N/A 08/19/2015   Procedure: Atrial Fibrillation Ablation;  Surgeon: Thompson Grayer, MD;  Location: [facility];  Service: Cardiovascular;  Laterality: N/A;  . ESOPHAGOGASTRODUODENOSCOPY N/A 02/13/2018   Procedure: ESOPHAGOGASTRODUODENOSCOPY (EGD);  Surgeon: Ronnette Juniper, MD;  Location: [facility];  Service: Gastroenterology;  Laterality: N/A;  . FOOT SURGERY    . IR REMOVAL TUN ACCESS W/ PORT W/O FL MOD SED  04/01/2017  . LUMBAR LAMINECTOMY    . MASS BIOPSY Left 12/07/2013   Procedure: EXCISIONAL BIOPSY LEFT NECK MASS ;  Surgeon: Jerrell Belfast, MD;  Location: [facility];  Service: ENT;  Laterality: Left;  Marland Kitchen MASTECTOMY Right 2017   malignant  . POLYPECTOMY  02/13/2018   Procedure: POLYPECTOMY;  Surgeon: Ronnette Juniper, MD;  Location: [facility];  Service: Gastroenterology;;  EGD  . PORTACATH PLACEMENT  12/2013   Archie Endo 01/04/2014  .  PUBOVAGINAL SLING    . SKIN TAG REMOVAL  2012   leg  . TEE WITHOUT CARDIOVERSION N/A 08/19/2015   Procedure: TRANSESOPHAGEAL ECHOCARDIOGRAM (TEE);  Surgeon: Larey [facility]er, MD;  Location: [facility];  Service: Cardiovascular;  Laterality: N/A;  . TONSILLECTOMY    . TOTAL MASTECTOMY Right 05/27/2015   Procedure: RIGHT TOTAL MASTECTOMY;  Surgeon: Fanny Skates, MD;  Location: [facility];  Service: General;  Laterality: Right;   Social History   Socioeconomic History  . Marital status: Married    Spouse name: Not on file  . Number of children: 2  . Years of education: Not on file  . Highest education level: Not on file  Occupational History  . Not on file  Tobacco Use  . Smoking status: Never Smoker  . Smokeless tobacco: Never Used  Substance and Sexual Activity  . Alcohol use: No    Alcohol/week: 0.0 standard drinks  . Drug use: No  . Sexual activity: Not Currently    Birth control/protection: Surgical  Other Topics Concern  . Not on file  Social History Narrative   Pt  lives in [place] with spouse.   Retired Therapist, sports.  Previously worked in [employer].   Social Determinants of Health   Financial Resource Strain:   . Difficulty of Paying Living Expenses: Not on file  Food Insecurity:   . Worried About Charity fundraiser in the Last Year: Not on file  . Ran Out of Food in the Last Year: Not on file  Transportation Needs:   . Lack of Transportation (Medical): Not on file  . Lack of Transportation (Non-Medical): Not on file  Physical Activity:   . Days of Exercise per Week: Not on file  . Minutes of Exercise per Session: Not on file  Stress:   . Feeling of Stress : Not on file  Social Connections:   . Frequency of Communication with Friends and Family: Not on file  . Frequency of Social Gatherings with Friends and Family: Not on file  . Attends Religious Services: Not on file  . Active Member of Clubs or Organizations: Not on file  . Attends Archivist Meetings:  Not on file  . Marital Status: Not on file  Intimate Partner Violence:   . Fear of Current or Ex-Partner: Not on file  . Emotionally Abused: Not on file  . Physically Abused: Not on file  . Sexually Abused: Not on file   ROS  Review of Systems  Constitution: Negative for weight gain.  Cardiovascular: Negative for dyspnea on exertion, leg swelling and syncope.  Respiratory: Negative for hemoptysis.   Endocrine: Negative for cold intolerance.  Hematologic/Lymphatic: Does not bruise/bleed easily.  Gastrointestinal: Negative for hematochezia and melena.  Genitourinary: Positive for dysuria and frequency.  Neurological: Negative for headaches and light-headedness.   Objective   Vitals with BMI 03/24/2019 03/23/2019 03/23/2019  Height - - 5' 6.5"  Weight 144 lbs 11 oz - 146 lbs 13 oz  BMI [id number] - [id number]  Systolic [id number] -  Diastolic 71 67 -  Pulse 64 58 -     Physical Exam  Neck: No thyromegaly present.  Cardiovascular: Normal rate, regular rhythm, normal heart sounds and intact distal pulses. Exam reveals no gallop.  No murmur heard. No leg edema, no JVD.  Pulmonary/Chest: Effort normal and breath sounds normal.  Abdominal: Soft. Bowel sounds are normal.  Musculoskeletal:     Cervical back: Neck supple.  Skin: Skin is warm and dry.   Laboratory examination:   Recent Labs    09/21/18 0930 09/21/18 0930 03/16/19 0932 03/23/19 1838 03/23/19 1903  NA 140  --  139 135 137  K 4.1   < > 5.1 4.4 4.4  CL 105   < > 100 105 106  CO2 24  --  24 18*  --   GLUCOSE 188*  --  117* 205* 205*  BUN 18  --  22 24* 23  CREATININE 0.93   < > 0.96 1.40* 1.30*  CALCIUM 9.5  --  9.6 8.9  --   GFRNONAA 58*  --  56* 35*  --   GFRAA >60  --  65 41*  --    < > = values in this interval not displayed.   estimated creatinine clearance is 33 mL/min (A) (by C-G formula based on SCr of 1.3 mg/dL (H)).  CMP Latest Ref Rng & Units 03/23/2019 03/23/2019 03/16/2019  Glucose 70 - 99 mg/dL 205(H) 205(H)  117(H)  BUN 8 - 23 mg/dL 23 24(H) 22  Creatinine 0.44 - 1.00 mg/dL 1.30(H) 1.40(H) 0.96  Sodium 135 - 145 mmol/L 137 135 139  Potassium 3.5 - 5.1 mmol/L 4.4 4.4 5.1  Chloride 98 - 111 mmol/L 106 105 100  CO2 22 - 32 mmol/L - 18(L) 24  Calcium 8.9 - 10.3 mg/dL - 8.9 9.6  Total Protein 6.5 - 8.1 g/dL - 5.4(L) 6.5  Total Bilirubin 0.3 - 1.2 mg/dL - 0.7 0.6  Alkaline Phos 38 - 126 U/L - 91 112  AST 15 - 41 U/L - 32 37  ALT 0 - 44 U/L - 26 27   CBC Latest Ref Rng & Units 03/23/2019 03/23/2019 03/16/2019  WBC 4.0 - 10.5 K/uL - 12.6(H) 8.6  Hemoglobin 12.0 - 15.0 g/dL 10.5(L) 10.6(L) 12.2  Hematocrit 36.0 - 46.0 % 31.0(L) 34.4(L) 35.5  Platelets 150 - 400 K/uL - 192 215   Lipid Panel     Component Value Date/Time   CHOL 123 03/16/2019 0932   TRIG 103 03/16/2019 0932   HDL 37 (L) 03/16/2019 0932   CHOLHDL 2.9 05/05/2010 0200   VLDL 33 05/05/2010 0200   LDLCALC 67 03/16/2019 0932   HEMOGLOBIN A1C Lab Results  Component Value Date   HGBA1C 5.8 (H) 03/16/2019   MPG 123 05/23/2015   TSH Recent Labs    03/16/19 0932 03/23/19 1839  TSH 2.860 6.601*   BNP (last 3 results) No results for input(s): BNP in the last 8760 hours.  Medications and allergies   Allergies  Allergen Reactions  . Morphine And Related Anaphylaxis and Other (See Comments)    Pt states she stopped breathing post op- "went into resp. arrest"  . Multaq [Dronedarone] Other (See Comments)    Reaction:  Blood in urine and elevated liver enzymes.   . Demerol Nausea And Vomiting  . Flecainide Other (See Comments)    Mobitz II AV Block  . Oysters [Shellfish Allergy] Rash    & CLAMS  . Penicillins Itching and Rash    Has patient had a PCN reaction causing immediate rash, facial/tongue/throat swelling, SOB or lightheadedness with hypotension: No Has patient had a PCN reaction causing severe rash involving mucus membranes or skin necrosis: Yes Has patient had a PCN reaction that required hospitalization No Has  patient had a PCN reaction occurring within the last 10 years: No If all of the above answers are "NO", then may proceed with Cephalosporin use.   [facility] Drugs Cross Reactors Rash       Current Outpatient Medications  Medication Instructions  . albuterol (PROVENTIL HFA;VENTOLIN HFA) 108 (90 Base) MCG/ACT inhaler 1-2 puffs, Inhalation, Every 6 hours PRN  . anastrozole (ARIMIDEX) 1 mg, Oral, Daily  . B-12 2,500 mcg, Oral, Daily with lunch  . diltiazem (CARDIZEM CD) 120 MG 24 hr capsule TAKE 1 CAPSULE BY MOUTH EVERY DAY  . Fluticasone-Salmeterol (ADVAIR) 250-50 MCG/DOSE AEPB 1 puff, Inhalation, Every 12 hours  . gabapentin (NEURONTIN) 600 mg, Oral, 2 times daily  . levothyroxine (SYNTHROID) 200 mcg, Oral, Daily before breakfast  . Magnesium Oxide 500 mg, Oral, 2 times daily  . metFORMIN (GLUCOPHAGE-XR) 500 mg, Oral, 2 times daily with meals  . metoprolol succinate (TOPROL-XL) 25 mg, Oral, Daily  . montelukast (SINGULAIR) 10 mg, Daily  . nitrofurantoin (macrocrystal-monohydrate) (MACROBID) 100 mg, Oral, 2 times daily  . omega-3 acid ethyl esters (LOVAZA) 1 g, Oral, 2 times daily  . Prolia 60 mg, Intravenous, Every 6 months  . traMADol-acetaminophen (ULTRACET) 37.5-325 MG tablet 1 tablet, Oral, Every 6 hours PRN  . triamterene-hydrochlorothiazide (MAXZIDE-25) 37.5-25 MG  tablet 1 tablet, Oral, Daily PRN  . Vitamin D 2,000 Units, Daily with lunch  . XARELTO 20 MG TABS tablet TAKE 1 TABLET (20 MG TOTAL) BY MOUTH DAILY WITH SUPPER.    I/O last 3 completed shifts: In: 500 [IV Piggyback:500] Out: 350 [Urine:350] No intake/output data recorded.    Radiology:  [facility] Chest Port 1 View  Result Date: 03/23/2019 CLINICAL DATA:  Weakness EXAM: PORTABLE CHEST 1 VIEW COMPARISON:  10/18/2018 FINDINGS: Right lung is clear. Possible airspace disease at the left base though partially obscured by overlying pacer patches. Borderline cardiomegaly. Aortic atherosclerosis. No pneumothorax. IMPRESSION: Hazy  left lung base opacity, atelectasis versus mild infiltrate, this region is partially obscured by overlying support devices Electronically Signed   By: Donavan Foil M.D.   On: 03/23/2019 19:17   Cardiac Studies:   Coronary angiogram 2010 (no stents-Dr. Peter Martinique)  Mild coronary calcification, Normal EF. Admitted to [facility] in March 2012 for A. Fibrillation with RVR. Stress cardiolite 3/12 [facility]) no ischemia .    Echocardiogram 11/09/2017: Left ventricle cavity is normal in size. Moderate concentric hypertrophy of the left ventricle. Mild decrease in global wall motion. Doppler evidence of grade II (pseudonormal) diastolic dysfunction, elevated LAP. Calculated EF 45%. Left atrial cavity is mildly dilated. Mild to moderate aortic regurgitation. Mild (Grade I) mitral regurgitation. Mild tricuspid regurgitation. Estimated pulmonary artery systolic pressure 22  mmHg.  Assessment   1.  Paroxysmal atrial fibrillation 2.  Atrial flutter atypical 3.  Hypertension controlled  4. Dysuria and abnormal U/A  EKG 03/23/2019: Atrial fibrillation with controlled ventricular response, ventricular rate 40 to 45 bpm, nonspecific ST-T abnormality, low-voltage complexes. EKG 03/24/2019: Sinus rhythm with first-degree AV block, low-voltage complexes.  Normal QT interval.  Recommendations:   AMII AMBROSI  is a 79 y.o.  female  with   paroxysmal atrial flutter/atrial fibrillation, symptomatic with fatigue and dyspnea, has had ablation on 08/19/2015 and again on 04/14/2018 by Dr. Thompson Grayer, back in atrial fibrillation in March 2020 which she underwent direct current cardioversion.   She has also had lymphoma which was treated in 2015 and is in remission, right breast cancer, status post lumpectomy in 2017 followed by chemotherapy therapy. She is presently on long-term anticoagulation. Medical history is also significant for controlled diabetes mellitus, hyperlipidemia, hypertension.  At  the hospital with hypotension and bradycardia and was in atrial fibrillation with this morning.  Back in sinus rhythm, states that she feels well.Will discharge her home patient follow-up and reduce the dose of metoprolol succinate to 25 mg daily.   Will get urine cultures and start on Macrobid for now and forward copy to PCP.  She had seen her urologist last week and was found to have asymptomatic bacteriuria but now she is having burning sensation in the urine and also mild increased frequency.  Suspect yesterday's presentation with low blood pressure and dizziness and fatigue probably related to UTI.  Allergies as of 03/24/2019      Reactions   Morphine And Related Anaphylaxis, Other (See Comments)   Pt states she stopped breathing post op- "went into resp. arrest"   Multaq [dronedarone] Other (See Comments)   Reaction:  Blood in urine and elevated liver enzymes.    Demerol Nausea And Vomiting   Flecainide Other (See Comments)   Mobitz II AV Block   Oysters [shellfish Allergy] Rash   & CLAMS   Penicillins Itching, Rash   Has patient had a PCN reaction causing immediate rash, facial/tongue/throat  swelling, SOB or lightheadedness with hypotension: No Has patient had a PCN reaction causing severe rash involving mucus membranes or skin necrosis: Yes Has patient had a PCN reaction that required hospitalization No Has patient had a PCN reaction occurring within the last 10 years: No If all of the above answers are "NO", then may proceed with Cephalosporin use.   Sulfa Drugs Cross Reactors Rash      Medication List    TAKE these medications   albuterol 108 (90 Base) MCG/ACT inhaler Commonly known as: VENTOLIN HFA Inhale 1-2 puffs into the lungs every 6 (six) hours as needed for wheezing or shortness of breath.   anastrozole 1 MG tablet Commonly known as: ARIMIDEX Take 1 tablet (1 mg total) by mouth daily.   B-12 2500 MCG Tabs Take 2,500 mcg by mouth daily with lunch.   diltiazem 120  MG 24 hr capsule Commonly known as: CARDIZEM CD TAKE 1 CAPSULE BY MOUTH EVERY DAY What changed: how much to take   Fluticasone-Salmeterol 250-50 MCG/DOSE Aepb Commonly known as: ADVAIR Inhale 1 puff into the lungs every 12 (twelve) hours.   gabapentin 600 MG tablet Commonly known as: NEURONTIN Take 600 mg by mouth 2 (two) times daily.   levothyroxine 200 MCG tablet Commonly known as: SYNTHROID Take 200 mcg by mouth daily before breakfast.   Magnesium Oxide 500 MG Tabs Take 500 mg by mouth 2 (two) times daily.   metFORMIN 500 MG 24 hr tablet Commonly known as: GLUCOPHAGE-XR Take 500 mg by mouth 2 (two) times daily with a meal.   metoprolol succinate 25 MG 24 hr tablet Commonly known as: TOPROL-XL Take 1 tablet (25 mg total) by mouth daily. What changed:   medication strength  how much to take  additional instructions   montelukast 10 MG tablet Commonly known as: SINGULAIR Take 10 mg by mouth daily.   nitrofurantoin (macrocrystal-monohydrate) 100 MG capsule Commonly known as: Macrobid Take 1 capsule (100 mg total) by mouth 2 (two) times daily for 7 days.   omega-3 acid ethyl esters 1 g capsule Commonly known as: LOVAZA Take 1 g by mouth 2 (two) times daily.   Prolia 60 MG/ML Sosy injection Generic drug: denosumab Inject 60 mg into the vein every 6 (six) months.   traMADol-acetaminophen 37.5-325 MG tablet Commonly known as: ULTRACET Take 1 tablet by mouth every 6 (six) hours as needed (pain).   triamterene-hydrochlorothiazide 37.5-25 MG tablet Commonly known as: MAXZIDE-25 Take 1 tablet by mouth daily as needed (for fluid retention/swelling.).   Vitamin D 50 MCG (2000 UT) tablet Take 2,000 Units by mouth daily with lunch.   Xarelto 20 MG Tabs tablet Generic drug: rivaroxaban TAKE 1 TABLET (20 MG TOTAL) BY MOUTH DAILY WITH SUPPER. What changed: See the new instructions.       Adrian Prows, MD, [facility] 03/24/2019, 8:05 AM [facility] Pager:  [phone] Office: [phone]

## 2019-03-26 LAB — URINE CULTURE: Culture: >=100000 — AB

## 2019-04-05 ENCOUNTER — Other Ambulatory Visit: Payer: Self-pay

## 2019-04-05 ENCOUNTER — Ambulatory Visit (INDEPENDENT_AMBULATORY_CARE_PROVIDER_SITE_OTHER): Payer: Medicare Other

## 2019-04-05 DIAGNOSIS — R0609 Other forms of dyspnea: Secondary | ICD-10-CM

## 2019-04-05 DIAGNOSIS — I480 Paroxysmal atrial fibrillation: Secondary | ICD-10-CM

## 2019-04-06 DIAGNOSIS — N3020 Other chronic cystitis without hematuria: Secondary | ICD-10-CM | POA: Diagnosis not present

## 2019-04-11 ENCOUNTER — Other Ambulatory Visit: Payer: Medicare Other

## 2019-04-13 DIAGNOSIS — Z961 Presence of intraocular lens: Secondary | ICD-10-CM | POA: Diagnosis not present

## 2019-04-13 DIAGNOSIS — H26492 Other secondary cataract, left eye: Secondary | ICD-10-CM | POA: Diagnosis not present

## 2019-04-13 DIAGNOSIS — E119 Type 2 diabetes mellitus without complications: Secondary | ICD-10-CM | POA: Diagnosis not present

## 2019-04-13 DIAGNOSIS — H5201 Hypermetropia, right eye: Secondary | ICD-10-CM | POA: Diagnosis not present

## 2019-04-16 ENCOUNTER — Encounter: Payer: Self-pay | Admitting: Podiatry

## 2019-04-16 ENCOUNTER — Ambulatory Visit (INDEPENDENT_AMBULATORY_CARE_PROVIDER_SITE_OTHER): Payer: Medicare Other | Admitting: Podiatry

## 2019-04-16 ENCOUNTER — Other Ambulatory Visit: Payer: Self-pay

## 2019-04-16 DIAGNOSIS — M79675 Pain in left toe(s): Secondary | ICD-10-CM

## 2019-04-16 DIAGNOSIS — S90221A Contusion of right lesser toe(s) with damage to nail, initial encounter: Secondary | ICD-10-CM

## 2019-04-16 DIAGNOSIS — B351 Tinea unguium: Secondary | ICD-10-CM

## 2019-04-16 DIAGNOSIS — M79674 Pain in right toe(s): Secondary | ICD-10-CM | POA: Diagnosis not present

## 2019-04-16 DIAGNOSIS — E1142 Type 2 diabetes mellitus with diabetic polyneuropathy: Secondary | ICD-10-CM

## 2019-04-16 NOTE — Patient Instructions (Signed)
Diabetes Mellitus and Foot Care Foot care is an important part of your health, especially when you have diabetes. Diabetes may cause you to have problems because of poor blood flow (circulation) to your feet and legs, which can cause your skin to:  Become thinner and drier.  Break more easily.  Heal more slowly.  Peel and crack. You may also have nerve damage (neuropathy) in your legs and feet, causing decreased feeling in them. This means that you may not notice minor injuries to your feet that could lead to more serious problems. Noticing and addressing any potential problems early is the best way to prevent future foot problems. How to care for your feet Foot hygiene  Wash your feet daily with warm water and mild soap. Do not use hot water. Then, pat your feet and the areas between your toes until they are completely dry. Do not soak your feet as this can dry your skin.  Trim your toenails straight across. Do not dig under them or around the cuticle. File the edges of your nails with an emery board or nail file.  Apply a moisturizing lotion or petroleum jelly to the skin on your feet and to dry, brittle toenails. Use lotion that does not contain alcohol and is unscented. Do not apply lotion between your toes. Shoes and socks  Wear clean socks or stockings every day. Make sure they are not too tight. Do not wear knee-high stockings since they may decrease blood flow to your legs.  Wear shoes that fit properly and have enough cushioning. Always look in your shoes before you put them on to be sure there are no objects inside.  To break in new shoes, wear them for just a few hours a day. This prevents injuries on your feet. Wounds, scrapes, corns, and calluses  Check your feet daily for blisters, cuts, bruises, sores, and redness. If you cannot see the bottom of your feet, use a mirror or ask someone for help.  Do not cut corns or calluses or try to remove them with medicine.  If you  find a minor scrape, cut, or break in the skin on your feet, keep it and the skin around it clean and dry. You may clean these areas with mild soap and water. Do not clean the area with peroxide, alcohol, or iodine.  If you have a wound, scrape, corn, or callus on your foot, look at it several times a day to make sure it is healing and not infected. Check for: ? Redness, swelling, or pain. ? Fluid or blood. ? Warmth. ? Pus or a bad smell. General instructions  Do not cross your legs. This may decrease blood flow to your feet.  Do not use heating pads or hot water bottles on your feet. They may burn your skin. If you have lost feeling in your feet or legs, you may not know this is happening until it is too late.  Protect your feet from hot and cold by wearing shoes, such as at the beach or on hot pavement.  Schedule a complete foot exam at least once a year (annually) or more often if you have foot problems. If you have foot problems, report any cuts, sores, or bruises to your health care provider immediately. Contact a health care provider if:  You have a medical condition that increases your risk of infection and you have any cuts, sores, or bruises on your feet.  You have an injury that is not   healing.  You have redness on your legs or feet.  You feel burning or tingling in your legs or feet.  You have pain or cramps in your legs and feet.  Your legs or feet are numb.  Your feet always feel cold.  You have pain around a toenail. Get help right away if:  You have a wound, scrape, corn, or callus on your foot and: ? You have pain, swelling, or redness that gets worse. ? You have fluid or blood coming from the wound, scrape, corn, or callus. ? Your wound, scrape, corn, or callus feels warm to the touch. ? You have pus or a bad smell coming from the wound, scrape, corn, or callus. ? You have a fever. ? You have a red line going up your leg. Summary  Check your feet every day  for cuts, sores, red spots, swelling, and blisters.  Moisturize feet and legs daily.  Wear shoes that fit properly and have enough cushioning.  If you have foot problems, report any cuts, sores, or bruises to your health care provider immediately.  Schedule a complete foot exam at least once a year (annually) or more often if you have foot problems. This information is not intended to replace advice given to you by your health care provider. Make sure you discuss any questions you have with your health care provider. Document Revised: 11/08/2018 Document Reviewed: 03/19/2016 Elsevier Patient Education  2020 Elsevier Inc.  

## 2019-04-19 NOTE — Progress Notes (Signed)
Subjective: PATRICIANN EHRENFELD presents today for follow up of preventative diabetic foot care, follow up contusion right 2nd/3rd digits and painful mycotic nails b/l that are difficult to trim. Pain interferes with ambulation. Aggravating factors include wearing enclosed shoe gear. Pain is relieved with periodic professional debridement.  She relates the bruising on her right 2nd/3rd digits are continuing to resolve and she does not have any pain presently.  Allergies  Allergen Reactions  . Morphine And Related Anaphylaxis and Other (See Comments)    Pt states she stopped breathing post op- "went into resp. arrest"  . Multaq [Dronedarone] Other (See Comments)    Reaction:  Blood in urine and elevated liver enzymes.   . Demerol Nausea And Vomiting  . Flecainide Other (See Comments)    Mobitz II AV Block  . Oysters [Shellfish Allergy] Rash    & CLAMS  . Penicillins Itching and Rash    Has patient had a PCN reaction causing immediate rash, facial/tongue/throat swelling, SOB or lightheadedness with hypotension: No Has patient had a PCN reaction causing severe rash involving mucus membranes or skin necrosis: Yes Has patient had a PCN reaction that required hospitalization No Has patient had a PCN reaction occurring within the last 10 years: No If all of the above answers are "NO", then may proceed with Cephalosporin use.   . Sulfa Drugs Cross Reactors Rash     Objective: There were no vitals filed for this visit.  Vascular Examination:  Capillary refill time to digits immediate b/l, palpable DP pulses b/l, palpable PT pulses b/l, pedal hair sparse b/l and skin temperature gradient within normal limits b/l  Dermatological Examination: Pedal skin with normal turgor, texture and tone bilaterally, no open wounds bilaterally, no interdigital macerations bilaterally, toenails 1-5 b/l elongated, dystrophic, thickened, crumbly with subungual debris and subungual hematoma noted right 2nd/3rd  digits, nails remains adhered. No erythema, no edema, no drainage.   Musculoskeletal: Normal muscle strength 5/5 to all lower extremity muscle groups bilaterally, no gross bony deformities bilaterally and no pain crepitus or joint limitation noted with ROM b/l  Neurological: Protective sensation decreased with 10 gram monofilament b/l and vibratory sensation intact b/l  Assessment: 1. Pain due to onychomycosis of toenails of both feet   2. Diabetic peripheral neuropathy associated with type 2 diabetes mellitus (HCC)   3. Subungual hematoma of third toe of right foot, initial encounter     Plan: -Continue diabetic foot care principles. Literature dispensed on today.  -Toenails 1-5 b/l were debrided in length and girth without iatrogenic bleeding. -Continue to monitor subungual hematomas right 2nd/3rd digits -Patient to continue soft, supportive shoe gear daily. -Patient to report any pedal injuries to medical professional immediately. -Patient/POA to call should there be question/concern in the interim.  Return in about 3 months (around 07/14/2019) for diabetic nail trim/ Xarelto.

## 2019-04-24 ENCOUNTER — Ambulatory Visit: Payer: Medicare Other | Admitting: Cardiology

## 2019-04-24 ENCOUNTER — Other Ambulatory Visit: Payer: Self-pay

## 2019-04-24 ENCOUNTER — Ambulatory Visit: Payer: Medicare Other | Admitting: Hematology and Oncology

## 2019-04-24 ENCOUNTER — Encounter: Payer: Self-pay | Admitting: Cardiology

## 2019-04-24 ENCOUNTER — Other Ambulatory Visit: Payer: Medicare Other

## 2019-04-24 VITALS — BP 135/57 | HR 69 | Temp 98.6°F | Ht 66.5 in | Wt 143.8 lb

## 2019-04-24 DIAGNOSIS — I484 Atypical atrial flutter: Secondary | ICD-10-CM

## 2019-04-24 DIAGNOSIS — I10 Essential (primary) hypertension: Secondary | ICD-10-CM

## 2019-04-24 DIAGNOSIS — I480 Paroxysmal atrial fibrillation: Secondary | ICD-10-CM | POA: Diagnosis not present

## 2019-04-24 NOTE — Progress Notes (Signed)
Primary Physician/Referring:  Deland Pretty, MD  Patient ID: Patty [name], female    DOB: [birth year], 79 y.o.   MRN: [id number]  No chief complaint on file.  HPI:    Patty [name]  is a 79 y.o.  female  with  Paroxysmal symptomatic atrial flutter/atrial fibrillation, S/P  ablation on 08/19/2015 and again on 04/14/2018 by Dr. Thompson Grayer, back in atrial fibrillation in March 2020 which she underwent direct current cardioversion. She has h/o lymphoma treated in 2015 and is in remission, right breast cancer, status post lumpectomy in 2017 followed by chemotherapy therapy. She is presently on long-term anticoagulation. Medical history is also significant for controlled diabetes mellitus, hyperlipidemia, hypertension.   She presented to the emergency room with near syncope on 03/23/19 with A. Fib with RVR and spontaneously converted to sinus bradycardia and was found to have UTI and discharged home the following day with antibiotics on board and to f/u with PCP.  She now presents for follow-up.   Except for chronic dyspnea today she has no symptoms.  States that UTI symptoms of burning and frequency has also resolved.   Past Medical History:  Diagnosis Date  . Acute on chronic combined systolic and diastolic CHF (congestive heart failure) ([place])   . Arthritis   . Asthma   . Atrial flutter (HCC)    PAROXYSMAL, EKG 10/06/2017, ATRIAL FLUTTER (ATYPICAL) WITH VARIABLE VENTRICULAR RESPONCE AND CONTROLLED  . Atrial flutter, paroxysmal ([place]) 06/02/2018  . Breast cancer, right ([place]) 2/17   R sided, s/p total mastectomy  . Bulging lumbar disc    3  . Chronic kidney disease    recurring UTI  . Complication of anesthesia    "morphine made me stop breathing"  . Concentric left ventricular hypertrophy    MILD DECREASE IN GLOBAL WALL MOTION. DOPPLER EVIDENCE OF GRADE II (PSEUDONORMAL) DIASTOLIC DYSFUNCTION, ELEVATED LAP, CALCULATED EF 45%, LEFT ATRIAL CAVITY IS MILDLY DILATED. MILD TO MODERATE  AORTIC REGURGITATION. MILD (GRADE I) MITRAL REGURGITATION. MILD TRICUSPID REGURGITATION. ESTIMATED PULMONARY ARTERY SYSTOLIC PRESSURE 22 MMHG.  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 First degree AV block   . HCAP (healthcare-associated pneumonia) 12/2013   Archie Endo 01/18/2014  . History of cardioversion 06/02/2018  . History of skin cancer   . History of transfusion   . Hypertension   . Hypothyroidism   . Left atrial enlargement   . Leg swelling   . 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 Malaise and fatigue   . Nocturia   . Nonischemic cardiomyopathy ([place]) 06/02/2018  . Paroxysmal atrial fibrillation (HCC)   . Persistent atrial fibrillation ([place])   . Pure hypercholesterolemia   . Shortness of breath dyspnea    "at times"  . Skin cancer   . Sleep apnea    uses c-pap  . Type II diabetes mellitus ([place])    Past Surgical History:  Procedure Laterality Date  . ABDOMINAL HYSTERECTOMY  1972   partial  . ABDOMINAL HYSTERECTOMY    . APPENDECTOMY  1953  . ATRIAL FIBRILLATION ABLATION  08/19/2015   PVI with CTI ablation by Dr Rayann Heman  . ATRIAL FIBRILLATION ABLATION N/A 04/14/2018   Procedure: ATRIAL FIBRILLATION ABLATION;  Surgeon: Thompson Grayer, MD;  Location: [facility];  Service: Cardiovascular;  Laterality: N/A;  . BACK SURGERY     x2  . BIOPSY  02/13/2018   Procedure: BIOPSY;  Surgeon: Ronnette Juniper,  MD;  Location: [facility];  Service: Gastroenterology;;  X2=EGD & Colon  . BREAST SURGERY     Right mastectomy  . CARDIAC CATHETERIZATION  10/10/2008   Nonobstructive CAD  . CARDIOVERSION  04/13/2011   Procedure: CARDIOVERSION;  Surgeon: Laverda Page, MD;  Location: [facility];  Service: Cardiovascular;  Laterality: N/A;  . CARDIOVERSION N/A 06/18/2015   Procedure: CARDIOVERSION;  Surgeon: Adrian Prows, MD;  Location: [facility];  Service: Cardiovascular;  Laterality: N/A;  . CARDIOVERSION N/A 10/26/2016    Procedure: CARDIOVERSION;  Surgeon: Adrian Prows, MD;  Location: [facility];  Service: Cardiovascular;  Laterality: N/A;  . CARDIOVERSION N/A 10/11/2017   Procedure: CARDIOVERSION;  Surgeon: Nigel Mormon, MD;  Location: [facility]COPY;  Service: Cardiovascular;  Laterality: N/A;  . CARDIOVERSION N/A 05/23/2018   Procedure: CARDIOVERSION;  Surgeon: Adrian Prows, MD;  Location: [facility];  Service: Cardiovascular;  Laterality: N/A;  . CATARACT EXTRACTION W/ INTRAOCULAR LENS  IMPLANT, BILATERAL Bilateral   . CHOLECYSTECTOMY  1993  . COLONOSCOPY N/A 02/13/2018   Procedure: COLONOSCOPY;  Surgeon: Ronnette Juniper, MD;  Location: [facility];  Service: Gastroenterology;  Laterality: N/A;  . ELECTROPHYSIOLOGIC STUDY N/A 08/19/2015   Procedure: Atrial Fibrillation Ablation;  Surgeon: Thompson Grayer, MD;  Location: [facility];  Service: Cardiovascular;  Laterality: N/A;  . ESOPHAGOGASTRODUODENOSCOPY N/A 02/13/2018   Procedure: ESOPHAGOGASTRODUODENOSCOPY (EGD);  Surgeon: Ronnette Juniper, MD;  Location: [facility];  Service: Gastroenterology;  Laterality: N/A;  . FOOT SURGERY    . IR REMOVAL TUN ACCESS W/ PORT W/O FL MOD SED  04/01/2017  . LUMBAR LAMINECTOMY    . MASS BIOPSY Left 12/07/2013   Procedure: EXCISIONAL BIOPSY LEFT NECK MASS ;  Surgeon: Jerrell Belfast, MD;  Location: [place];  Service: ENT;  Laterality: Left;  Marland Kitchen MASTECTOMY Right 2017   malignant  . POLYPECTOMY  02/13/2018   Procedure: POLYPECTOMY;  Surgeon: Ronnette Juniper, MD;  Location: [facility];  Service: Gastroenterology;;  EGD  . PORTACATH PLACEMENT  12/2013   Archie Endo 01/04/2014  . PUBOVAGINAL SLING    . SKIN TAG REMOVAL  2012   leg  . TEE WITHOUT CARDIOVERSION N/A 08/19/2015   Procedure: TRANSESOPHAGEAL ECHOCARDIOGRAM (TEE);  Surgeon: Larey Dresser, MD;  Location: [facility];  Service: Cardiovascular;  Laterality: N/A;  . TONSILLECTOMY    . TOTAL MASTECTOMY Right 05/27/2015   Procedure: RIGHT TOTAL MASTECTOMY;  Surgeon: Fanny Skates, MD;  Location: [facility];  Service: General;  Laterality: Right;   Social History   Tobacco Use  . Smoking status: Never Smoker  . Smokeless tobacco: Never Used  Substance Use Topics  . Alcohol use: No    Alcohol/week: 0.0 standard drinks   Family History  Problem Relation Age of Onset  . Stroke Father 58  . Heart attack Father 57  . Hypertension Father   . Heart disease Mother   . Arthritis Mother   . Breast cancer Mother 39  . Heart failure Mother   . Heart attack Brother   . Hypertension Brother   . Diabetes Sister   . Cancer Sister 10    ROS  Review of Systems  Constitution: Negative for weight gain.  Cardiovascular: Positive for dyspnea on exertion (mild and stable). Negative for leg swelling and syncope.  Respiratory: Negative for hemoptysis.   Endocrine: Negative for cold intolerance.  Hematologic/Lymphatic: Does not bruise/bleed easily.  Gastrointestinal: Negative for melena.  Neurological: Negative for headaches and light-headedness.   Objective  There were no vitals taken  for this visit.  Vitals with BMI 03/24/2019 03/23/2019 03/23/2019  Height - - 5' 6.5"  Weight 144 lbs 11 oz - 146 lbs 13 oz  BMI [id number] - [id number]  Systolic [id number] -  Diastolic 71 67 -  Pulse 64 58 -     Physical Exam  Constitutional:  petite  Neck: No thyromegaly present.  Cardiovascular: Regular rhythm, normal heart sounds, intact distal pulses and normal pulses. Tachycardia present. Exam reveals no gallop.  No murmur heard. No leg edema, no JVD.  Pulmonary/Chest: Effort normal and breath sounds normal.  Abdominal: Soft. Bowel sounds are normal.  Musculoskeletal:     Cervical back: Neck supple.  Skin: Skin is warm and dry.   Laboratory examination:   Recent Labs    09/21/18 0930 09/21/18 0930 03/16/19 0932 03/23/19 1838 03/23/19 1903  NA 140  --  139 135 137  K 4.1   < > 5.1 4.4 4.4  CL 105   < > 100 105 106  CO2 24  --  24 18*  --   GLUCOSE 188*  --  117* 205* 205*    BUN 18  --  22 24* 23  CREATININE 0.93   < > 0.96 1.40* 1.30*  CALCIUM 9.5  --  9.6 8.9  --   GFRNONAA 58*  --  56* 35*  --   GFRAA >60  --  65 41*  --    < > = values in this interval not displayed.   CrCl cannot be calculated (Patient's most recent lab result is older than the maximum 21 days allowed.).  CMP Latest Ref Rng & Units 03/23/2019 03/23/2019 03/16/2019  Glucose 70 - 99 mg/dL 205(H) 205(H) 117(H)  BUN 8 - 23 mg/dL 23 24(H) 22  Creatinine 0.44 - 1.00 mg/dL 1.30(H) 1.40(H) 0.96  Sodium 135 - 145 mmol/L 137 135 139  Potassium 3.5 - 5.1 mmol/L 4.4 4.4 5.1  Chloride 98 - 111 mmol/L 106 105 100  CO2 22 - 32 mmol/L - 18(L) 24  Calcium 8.9 - 10.3 mg/dL - 8.9 9.6  Total Protein 6.5 - 8.1 g/dL - 5.4(L) 6.5  Total Bilirubin 0.3 - 1.2 mg/dL - 0.7 0.6  Alkaline Phos 38 - 126 U/L - 91 112  AST 15 - 41 U/L - 32 37  ALT 0 - 44 U/L - 26 27   CBC Latest Ref Rng & Units 03/23/2019 03/23/2019 03/16/2019  WBC 4.0 - 10.5 K/uL - 12.6(H) 8.6  Hemoglobin 12.0 - 15.0 g/dL 10.5(L) 10.6(L) 12.2  Hematocrit 36.0 - 46.0 % 31.0(L) 34.4(L) 35.5  Platelets 150 - 400 K/uL - 192 215   Lipid Panel     Component Value Date/Time   CHOL 123 03/16/2019 0932   TRIG 103 03/16/2019 0932   HDL 37 (L) 03/16/2019 0932   CHOLHDL 2.9 05/05/2010 0200   VLDL 33 05/05/2010 0200   LDLCALC 67 03/16/2019 0932   HEMOGLOBIN A1C Lab Results  Component Value Date   HGBA1C 5.8 (H) 03/16/2019   MPG 123 05/23/2015   TSH Recent Labs    03/16/19 0932 03/23/19 1839  TSH 2.860 6.601*    Medications and allergies   Allergies  Allergen Reactions  . Morphine And Related Anaphylaxis and Other (See Comments)    Pt states she stopped breathing post op- "went into resp. arrest"  . Multaq [Dronedarone] Other (See Comments)    Reaction:  Blood in urine and elevated liver enzymes.   . Demerol Nausea And Vomiting  .  Flecainide Other (See Comments)    Mobitz II AV Block  . Oysters [Shellfish Allergy] Rash    & CLAMS  .  Penicillins Itching and Rash    Has patient had a PCN reaction causing immediate rash, facial/tongue/throat swelling, SOB or lightheadedness with hypotension: No Has patient had a PCN reaction causing severe rash involving mucus membranes or skin necrosis: Yes Has patient had a PCN reaction that required hospitalization No Has patient had a PCN reaction occurring within the last 10 years: No If all of the above answers are "NO", then may proceed with Cephalosporin use.   Ignacia [facility] Drugs Cross Reactors Rash     Current Outpatient Medications  Medication Instructions  . albuterol (PROVENTIL HFA;VENTOLIN HFA) 108 (90 Base) MCG/ACT inhaler 1-2 puffs, Inhalation, Every 6 hours PRN  . anastrozole (ARIMIDEX) 1 mg, Oral, Daily  . B-12 2,500 mcg, Oral, Daily with lunch  . ciprofloxacin (CIPRO) 250 mg, Oral, 2 times daily  . diltiazem (CARDIZEM CD) 120 MG 24 hr capsule TAKE 1 CAPSULE BY MOUTH EVERY DAY  . Fluticasone-Salmeterol (ADVAIR) 250-50 MCG/DOSE AEPB 1 puff, Inhalation, Every 12 hours  . gabapentin (NEURONTIN) 600 mg, Oral, 2 times daily  . levothyroxine (SYNTHROID) 200 mcg, Oral, Daily before breakfast  . Magnesium Oxide 500 mg, Oral, 2 times daily  . metFORMIN (GLUCOPHAGE-XR) 500 mg, Oral, 2 times daily with meals  . metoprolol succinate (TOPROL-XL) 25 mg, Oral, Daily  . montelukast (SINGULAIR) 10 mg, Daily  . omega-3 acid ethyl esters (LOVAZA) 1 g, Oral, 2 times daily  . Prolia 60 mg, Intravenous, Every 6 months  . traMADol-acetaminophen (ULTRACET) 37.5-325 MG tablet 1 tablet, Oral, Every 6 hours PRN  . triamterene-hydrochlorothiazide (MAXZIDE-25) 37.5-25 MG tablet 1 tablet, Oral, Daily PRN  . Vitamin D 2,000 Units, Daily with lunch  . XARELTO 20 MG TABS tablet TAKE 1 TABLET (20 MG TOTAL) BY MOUTH DAILY WITH SUPPER.    Radiology:  No results found.  Cardiac Studies:   Coronary angiogram 2010 (no stents-Dr. Peter Martinique)  Mild coronary calcification, Normal EF. [facility] for A. Fibrillation with RVR. Stress cardiolite 3/12 [facility]) no ischemia .   Echocardiogram 04/05/2019:  Left ventricle cavity is normal in size. Moderate concentric hypertrophy  of the left ventricle. Normal global wall motion. Normal LV systolic  function with EF 56%. Unable to evaluate diastolic function due to atrial  fibrillation.  Left atrial cavity is moderately dilated.  Trileaflet aortic valve. Moderate (Grade II) aortic regurgitation.  Moderate (Grade II) mitral regurgitation.  Moderate tricuspid regurgitation. Estimated pulmonary artery systolic  pressure is 30 mmHg, increased from 22 mmHg on previous study on  11/09/2017.   Assessment     ICD-10-CM   1. Paroxysmal atrial fibrillation (HCC)  I48.0   2. Atypical atrial flutter (HCC)  I48.4   3. Essential hypertension  I10      (Unable to tolerate Multaq due to abnorma LFT. Flecainide caused hight degree AV Block. Consider Tikosyn if recurrent A. FIb)  EKG 04/24/2019: Sinus bradycardia at rate of 50 bpm with first-degree AV block, PR interval 340 ms.  Left axis deviation, left anterior fascicular block.  Cannot exclude inferior infarct old.  Poor R wave progression, cannot exclude anterior infarct old.  Nonspecific ST-T abnormality.  Normal QT interval. No significant change from 03/04/2019.  Previously heart rate was 61 bpm.  Compared to 09/08/2018, trifascicular block no longer present (1st AVB, LAD, RBBB).  EKG 03/13/2018: Atypical atrial  flutter with 2:1 conduction at the rate of 109 bpm, left axis deviation, left anterior fascicular block.  Right bundle branch block.  Poor R-wave progression, cannot exclude anteroseptal infarct old.  Low voltage complexes.  Pulmonary disease pattern.    No orders of the defined types were placed in this encounter.   There are no discontinued medications.   Recommendations:   NYARA BORDER  is a 79 y.o.  female  with  Paroxysmal symptomatic atrial  flutter/atrial fibrillation, S/P  ablation on 08/19/2015 and again on 04/14/2018 by Dr. Thompson Grayer, back in atrial fibrillation in March 2020 which she underwent direct current cardioversion. She has h/o lymphoma treated in 2015 and is in remission, right breast cancer, status post lumpectomy in 2017 followed by chemotherapy therapy. She is presently on long-term anticoagulation. Medical history is also significant for controlled diabetes mellitus, hyperlipidemia, hypertension.   She presented to the emergency room with severe UTI and near syncope on 03/23/19 with A. Fib with RVR and spontaneously converted to sinus bradycardia.  Today the for chronic dyspnea no other specific symptoms.  She is markedly bradycardic with a prolonged PR interval.  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  No suggestion or indication for pacemaker implantation.  Blood pressures well controlled advised her to continue to remain physically active and to inform me if she notices any decrease exercise capacity, worsening dyspnea, dizziness or sleep.  I would like to see her back in 3 months for close monitoring.   Adrian Prows, MD, [facility] 04/24/2019, 12:03 PM [facility]. [place] Office: [phone]

## 2019-04-26 ENCOUNTER — Other Ambulatory Visit: Payer: Self-pay

## 2019-04-26 ENCOUNTER — Inpatient Hospital Stay (HOSPITAL_BASED_OUTPATIENT_CLINIC_OR_DEPARTMENT_OTHER): Payer: Medicare Other | Admitting: Hematology and Oncology

## 2019-04-26 ENCOUNTER — Inpatient Hospital Stay: Payer: Medicare Other | Attending: Hematology and Oncology

## 2019-04-26 DIAGNOSIS — Z170 Estrogen receptor positive status [ER+]: Secondary | ICD-10-CM | POA: Diagnosis not present

## 2019-04-26 DIAGNOSIS — Z79811 Long term (current) use of aromatase inhibitors: Secondary | ICD-10-CM | POA: Diagnosis not present

## 2019-04-26 DIAGNOSIS — I484 Atypical atrial flutter: Secondary | ICD-10-CM | POA: Diagnosis not present

## 2019-04-26 DIAGNOSIS — C8591 Non-Hodgkin lymphoma, unspecified, lymph nodes of head, face, and neck: Secondary | ICD-10-CM

## 2019-04-26 DIAGNOSIS — C50211 Malignant neoplasm of upper-inner quadrant of right female breast: Secondary | ICD-10-CM | POA: Insufficient documentation

## 2019-04-26 DIAGNOSIS — D500 Iron deficiency anemia secondary to blood loss (chronic): Secondary | ICD-10-CM

## 2019-04-26 DIAGNOSIS — D509 Iron deficiency anemia, unspecified: Secondary | ICD-10-CM | POA: Insufficient documentation

## 2019-04-26 LAB — CBC WITH DIFFERENTIAL/PLATELET
Abs Immature Granulocytes: 0.15 10*3/uL — ABNORMAL HIGH (ref 0.00–0.07)
Basophils Absolute: 0.1 10*3/uL (ref 0.0–0.1)
Basophils Relative: 1 %
Eosinophils Absolute: 0.2 10*3/uL (ref 0.0–0.5)
Eosinophils Relative: 1 %
HCT: 37 % (ref 36.0–46.0)
Hemoglobin: 12.1 g/dL (ref 12.0–15.0)
Immature Granulocytes: 1 %
Lymphocytes Relative: 12 %
Lymphs Abs: 1.3 10*3/uL (ref 0.7–4.0)
MCH: 32 pg (ref 26.0–34.0)
MCHC: 32.7 g/dL (ref 30.0–36.0)
MCV: 97.9 fL (ref 80.0–100.0)
Monocytes Absolute: 1 10*3/uL (ref 0.1–1.0)
Monocytes Relative: 9 %
Neutro Abs: 7.8 10*3/uL — ABNORMAL HIGH (ref 1.7–7.7)
Neutrophils Relative %: 76 %
Platelets: 209 10*3/uL (ref 150–400)
RBC: 3.78 MIL/uL — ABNORMAL LOW (ref 3.87–5.11)
RDW: 13.2 % (ref 11.5–15.5)
WBC: 10.4 10*3/uL (ref 4.0–10.5)
nRBC: 0 % (ref 0.0–0.2)

## 2019-04-26 LAB — IRON AND TIBC
Iron: 85 ug/dL (ref 41–142)
Saturation Ratios: 28 % (ref 21–57)
TIBC: 308 ug/dL (ref 236–444)
UIBC: 223 ug/dL (ref 120–384)

## 2019-04-26 LAB — FERRITIN: Ferritin: 501 ng/mL — ABNORMAL HIGH (ref 11–307)

## 2019-04-26 MED ORDER — METOPROLOL SUCCINATE ER 25 MG PO TB24: 12.5000 mg | ORAL_TABLET | Freq: Every day | ORAL | 2 refills | Status: DC

## 2019-04-27 ENCOUNTER — Encounter: Payer: Self-pay | Admitting: Hematology and Oncology

## 2019-04-27 NOTE — Assessment & Plan Note (Signed)
Her last CT scan which showed no evidence of active disease She has completed chemotherapy treatment with no signs of recurrent for more than 2 years I will continue to examine her in 6 months

## 2019-04-27 NOTE — Assessment & Plan Note (Signed)
Her anemia has resolved and iron level is adequate

## 2019-04-27 NOTE — Progress Notes (Signed)
[place] OFFICE PROGRESS NOTE  Patient Care Team: Deland Pretty, MD as PCP - General (Internal Medicine) Adrian Prows, MD as Consulting Physician (Cardiology) Heath Lark, MD as Consulting Physician (Hematology and Oncology) Fanny Skates, MD as Consulting Physician (General Surgery) Eppie Gibson, MD as Attending Physician (Radiation Oncology) Sylvan Cheese, NP as Nurse Practitioner (Hematology and Oncology) Thompson Grayer, MD as Consulting Physician (Cardiology) Lahoma Rocker, MD as Consulting Physician (Rheumatology) Terrance Mass, MD (Inactive) as Consulting Physician (Gynecology)  ASSESSMENT & PLAN:  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, face, and neck [facility]) Her last CT scan which showed no evidence of active disease She has completed chemotherapy treatment with no signs of recurrent for more than 2 years I will continue to examine her in 6 months  Breast cancer of upper-inner quadrant of right female breast [facility]) She will continue anastrozole She tolerated treatment well without major side effects Her mammogram is scheduled for March  Iron deficiency anemia Her anemia has resolved and iron level is adequate   No orders of the defined types were placed in this encounter.   All questions were answered. The patient knows to call the clinic with any problems, questions or concerns. The total time spent in the appointment was 20 minutes encounter with patients including review of chart and various tests results, discussions about plan of care and coordination of care plan   Heath Lark, MD 04/27/2019 11:31 AM  INTERVAL HISTORY: Please see below for problem oriented charting. She returns for lymphoma follow-up and breast cancer follow-up She tolerated treatment well No major side effects from antiestrogen therapy such as mood swings or depression No new lymphadenopathy Her appetite is stable The patient denies any recent signs or  symptoms of bleeding such as spontaneous epistaxis, hematuria or hematochezia. 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   01/03/2014 Bone Marrow Biopsy   Accession: [id number] Bone marrow biopsy is negative   01/04/2014 Imaging   ECHO showed normal EF   01/04/2014 Procedure   She has placement of port   01/09/2014 - 03/07/2014 Chemotherapy   She was given treatment with bendamustine with rituximab. Treatment was stopped due to severe side-effects despite significant dose adjustment for cycle 2   01/18/2014 - 02/01/2014 Hospital Admission   She was admitted to the hospital from Escherichia coli sepsis with multiorgan failure and brief episodes of intubation. She was discharged to skilled nursing facility   02/26/2014 Imaging   PET/CT scan showed near complete remission   02/27/2014 Adverse Reaction   Cycle 2 of treatment was resumed with drastic dose adjustment to bendamustine due to recent multi-organ failure   04/03/2014 - 03/13/2015  Chemotherapy   She is started on maintenance rituximab only.   04/09/2014 - 04/12/2014 Hospital Admission   The patient was admitted to the hospital with urinary  tract infection and sepsis.   06/05/2014 Imaging   PET CT scan showed complete response to Rx   02/12/2015 Imaging   Ct scan showed no evidence of disease. It shows she has new pneumonia   04/02/2015 Miscellaneous   She received 1 dose IVIG complicated by mild infusion reaction   04/26/2015 - 04/30/2015 Hospital Admission   She had recurrent admission to the hospital with sepsis   10/22/2015 Imaging   CT scan of chest and abdomen showed Ill-defined tissue in the porta hepatis suggest treated lymphoma. No evidence of lymphadenopathy in the chest, abdomen & pelvis to suggest recurrent lymphoma.   10/20/2016 Imaging   1. Stable appearance of ill defined soft tissue within the porta hepatis compatible with treated lymphoma. 2. No new findings identified. 3. Aortic Atherosclerosis (ICD10-I70.0). LAD coronary artery calcifications noted.   04/01/2017 Procedure   Status post right IJ port catheter removal   Breast cancer of upper-inner quadrant of right female breast ([place])  11/06/2014 Imaging   DEXA scan showed osteopenia T-1.9 on bilateral femoral neck   03/31/2015 Imaging   Screening mammogram showed suspicious lesion, confirmed on diagnostic imaging at 2 and 230 position on the right breast   04/10/2015 Pathology Results   Accession: [id number] breast biopsy in 2 locations came back invasive ductal carcinoma with calcification, 100% ER and PR positive, HER 2 neg   04/10/2015 Clinical Stage   Stage IA: T1 N0   05/27/2015 Surgery   Right total mastectomy: multifocal IDC, 1.5 and 1.0 cm, neg margins, ER+ (100% - both); PR+ (100% and 95%), HER2neu negative (ratio 1.69 and 1.14) Ki67 2% and 10%. DCIS   05/27/2015 Pathologic Stage   Stage IA: mpT1c pNx pMx   06/17/2015 Survivorship   Survivorship care plan mailed to patient at her request   07/14/2015 -  Anti-estrogen oral therapy   She started taking Arimidex   04/01/2016 Imaging   No mammographic evidence of malignancy in the left breast.    04/08/2017 Imaging   Screening mammogram in one year. Neg     REVIEW OF SYSTEMS:   Constitutional: Denies fevers, chills or abnormal weight loss Eyes: Denies blurriness of vision Ears, nose, mouth, throat, and face: Denies mucositis or sore throat Respiratory: Denies cough, dyspnea or wheezes Cardiovascular: Denies palpitation, chest discomfort or lower extremity swelling Gastrointestinal:  Denies nausea, heartburn or change in bowel habits Skin: Denies abnormal skin rashes Lymphatics: Denies new lymphadenopathy or easy bruising Neurological:Denies numbness, tingling or new weaknesses Behavioral/Psych: Mood is stable, no new changes  All other systems were reviewed with the patient and are negative.  I have reviewed the past medical history, past surgical history, social history and family history with the patient and they are unchanged from previous note.  ALLERGIES:  is allergic to morphine and related; multaq [dronedarone]; demerol; flecainide; oysters [shellfish allergy]; penicillins; and sulfa drugs cross reactors.  MEDICATIONS:  Current Outpatient Medications  Medication Sig Dispense Refill  . albuterol (PROVENTIL HFA;VENTOLIN HFA) 108 (90 Base) MCG/ACT inhaler Inhale 1-2 puffs into the lungs every 6 (six) hours as needed for wheezing or shortness of breath.     . anastrozole (ARIMIDEX) 1 MG tablet Take 1 tablet (1 mg total) by mouth daily. 90 tablet 3  . Cholecalciferol (VITAMIN D) 2000 UNITS tablet Take 2,000 Units by mouth daily with  lunch.    . ciprofloxacin (CIPRO) 250 MG tablet Take 250 mg by mouth 2 (two) times daily.    . Cyanocobalamin (B-12) 2500 MCG TABS Take 2,500 mcg by mouth daily with lunch.     . diltiazem (CARDIZEM CD) 120 MG 24 hr capsule TAKE 1 CAPSULE BY MOUTH EVERY DAY (Patient taking differently: Take 120 mg by mouth daily. ) 90 capsule 1  . Fluticasone-Salmeterol (ADVAIR) 250-50 MCG/DOSE AEPB Inhale 1 puff into the lungs every 12 (twelve) hours.     .  gabapentin (NEURONTIN) 600 MG tablet Take 600 mg by mouth 2 (two) times daily.    Marland Kitchen levothyroxine (SYNTHROID, LEVOTHROID) 200 MCG tablet Take 200 mcg by mouth daily before breakfast.    . Magnesium Oxide 500 MG TABS Take 500 mg by mouth 2 (two) times daily.     . metFORMIN (GLUCOPHAGE-XR) 500 MG 24 hr tablet Take 500 mg by mouth 2 (two) times daily with a meal.    . metoprolol succinate (TOPROL-XL) 25 MG 24 hr tablet Take 0.5 tablets (12.5 mg total) by mouth daily. 15 tablet 2  . montelukast (SINGULAIR) 10 MG tablet Take 10 mg by mouth daily.    Marland Kitchen omega-3 acid ethyl esters (LOVAZA) 1 g capsule Take 1 g by mouth 2 (two) times daily.    Marland Kitchen PROLIA 60 MG/ML SOSY injection Inject 60 mg into the vein every 6 (six) months.    . traMADol-acetaminophen (ULTRACET) 37.5-325 MG tablet Take 1 tablet by mouth every 6 (six) hours as needed (pain).    . triamterene-hydrochlorothiazide (MAXZIDE-25) 37.5-25 MG tablet Take 1 tablet by mouth daily as needed (for fluid retention/swelling.).     Marland Kitchen XARELTO 20 MG TABS tablet TAKE 1 TABLET (20 MG TOTAL) BY MOUTH DAILY WITH SUPPER. 90 tablet 0   No current facility-administered medications for this visit.    PHYSICAL EXAMINATION: ECOG PERFORMANCE STATUS: 1 - Symptomatic but completely ambulatory  Vitals:   04/26/19 0932  BP: 113/64  Pulse: 85  Resp: 17  Temp: 98.2 F (36.8 C)  SpO2: 98%   Filed Weights   04/26/19 0932  Weight: 139 lb 11.2 oz (63.4 kg)    GENERAL:alert, no distress and comfortable SKIN: skin color, texture, turgor are normal, no rashes or significant lesions EYES: normal, Conjunctiva are pink and non-injected, sclera clear OROPHARYNX:no exudate, no erythema and lips, buccal mucosa, and tongue normal  NECK: supple, thyroid normal size, non-tender, without nodularity LYMPH:  no palpable lymphadenopathy in the cervical, axillary or inguinal LUNGS: clear to auscultation and percussion with normal breathing effort HEART: regular rate & rhythm  and no murmurs and no lower extremity edema ABDOMEN:abdomen soft, non-tender and normal bowel sounds Musculoskeletal:no cyanosis of digits and no clubbing  NEURO: alert & oriented x 3 with fluent speech, no focal motor/sensory deficits Well-healed mastectomy scar on the right side.  No abnormalities.  Normal breast exam on the left  LABORATORY DATA:  I have reviewed the data as listed    Component Value Date/Time   NA 137 03/23/2019 1903   NA 139 03/16/2019 0932   NA 140 01/27/2017 0811   K 4.4 03/23/2019 1903   K 3.9 01/27/2017 0811   CL 106 03/23/2019 1903   CO2 18 (L) 03/23/2019 1838   CO2 25 01/27/2017 0811   GLUCOSE 205 (H) 03/23/2019 1903   GLUCOSE 122 01/27/2017 0811   BUN 23 03/23/2019 1903   BUN 22 03/16/2019 0932   BUN 16.9 01/27/2017 0811   CREATININE  1.30 (H) 03/23/2019 1903   CREATININE 0.70 03/23/2018 1000   CREATININE 0.8 01/27/2017 0811   CALCIUM 8.9 03/23/2019 1838   CALCIUM 9.9 01/27/2017 0811   PROT 5.4 (L) 03/23/2019 1838   PROT 6.5 03/16/2019 0932   PROT 6.9 01/27/2017 0811   ALBUMIN 3.7 03/23/2019 1838   ALBUMIN 4.7 03/16/2019 0932   ALBUMIN 4.4 01/27/2017 0811   AST 32 03/23/2019 1838   AST 16 03/23/2018 1000   AST 15 01/27/2017 0811   ALT 26 03/23/2019 1838   ALT 13 03/23/2018 1000   ALT 15 01/27/2017 0811   ALKPHOS 91 03/23/2019 1838   ALKPHOS 77 01/27/2017 0811   BILITOT 0.7 03/23/2019 1838   BILITOT 0.6 03/16/2019 0932   BILITOT 0.5 03/23/2018 1000   BILITOT 0.65 01/27/2017 0811   GFRNONAA 35 (L) 03/23/2019 1838   GFRNONAA >60 03/23/2018 1000   GFRAA 41 (L) 03/23/2019 1838   GFRAA >60 03/23/2018 1000    No results found for: SPEP, UPEP  Lab Results  Component Value Date   WBC 10.4 04/26/2019   NEUTROABS 7.8 (H) 04/26/2019   HGB 12.1 04/26/2019   HCT 37.0 04/26/2019   MCV 97.9 04/26/2019   PLT 209 04/26/2019      Chemistry      Component Value Date/Time   NA 137 03/23/2019 1903   NA 139 03/16/2019 0932   NA 140 01/27/2017  0811   K 4.4 03/23/2019 1903   K 3.9 01/27/2017 0811   CL 106 03/23/2019 1903   CO2 18 (L) 03/23/2019 1838   CO2 25 01/27/2017 0811   BUN 23 03/23/2019 1903   BUN 22 03/16/2019 0932   BUN 16.9 01/27/2017 0811   CREATININE 1.30 (H) 03/23/2019 1903   CREATININE 0.70 03/23/2018 1000   CREATININE 0.8 01/27/2017 0811      Component Value Date/Time   CALCIUM 8.9 03/23/2019 1838   CALCIUM 9.9 01/27/2017 0811   ALKPHOS 91 03/23/2019 1838   ALKPHOS 77 01/27/2017 0811   AST 32 03/23/2019 1838   AST 16 03/23/2018 1000   AST 15 01/27/2017 0811   ALT 26 03/23/2019 1838   ALT 13 03/23/2018 1000   ALT 15 01/27/2017 0811   BILITOT 0.7 03/23/2019 1838   BILITOT 0.6 03/16/2019 0932   BILITOT 0.5 03/23/2018 1000   BILITOT 0.65 01/27/2017 0811       RADIOGRAPHIC STUDIES: I have personally reviewed the radiological images as listed and agreed with the findings in the report. PCV ECHOCARDIOGRAM COMPLETE  Result Date: 04/09/2019 Echocardiogram 04/05/2019: Left ventricle cavity is normal in size. Moderate concentric hypertrophy of the left ventricle. Normal global wall motion. Normal LV systolic function with EF 56%. Unable to evaluate diastolic function due to atrial fibrillation. Left atrial cavity is moderately dilated. Trileaflet aortic valve.  Moderate (Grade II) aortic regurgitation. Moderate (Grade II) mitral regurgitation. Moderate tricuspid regurgitation. Estimated pulmonary artery systolic pressure is 30 mmHg, increased from 22 mmHg  on previous study on 11/09/2017.

## 2019-04-27 NOTE — Assessment & Plan Note (Signed)
She will continue anastrozole She tolerated treatment well without major side effects Her mammogram is scheduled for March

## 2019-04-30 ENCOUNTER — Ambulatory Visit: Payer: Medicare Other

## 2019-04-30 DIAGNOSIS — N3000 Acute cystitis without hematuria: Secondary | ICD-10-CM | POA: Diagnosis not present

## 2019-04-30 DIAGNOSIS — N312 Flaccid neuropathic bladder, not elsewhere classified: Secondary | ICD-10-CM | POA: Diagnosis not present

## 2019-05-14 ENCOUNTER — Other Ambulatory Visit: Payer: Self-pay | Admitting: Cardiology

## 2019-05-14 DIAGNOSIS — I4891 Unspecified atrial fibrillation: Secondary | ICD-10-CM

## 2019-05-14 DIAGNOSIS — R635 Abnormal weight gain: Secondary | ICD-10-CM

## 2019-05-19 ENCOUNTER — Inpatient Hospital Stay (HOSPITAL_COMMUNITY)
Admission: EM | Admit: 2019-05-19 | Discharge: 2019-05-23 | DRG: 242 | Disposition: A | Discharge status: Home / Self Care | Payer: Medicare Other | Attending: Cardiology | Admitting: Cardiology

## 2019-05-19 ENCOUNTER — Encounter (HOSPITAL_COMMUNITY): Payer: Self-pay

## 2019-05-19 DIAGNOSIS — I9589 Other hypotension: Secondary | ICD-10-CM | POA: Diagnosis not present

## 2019-05-19 DIAGNOSIS — Z880 Allergy status to penicillin: Secondary | ICD-10-CM

## 2019-05-19 DIAGNOSIS — Z853 Personal history of malignant neoplasm of breast: Secondary | ICD-10-CM

## 2019-05-19 DIAGNOSIS — Z9011 Acquired absence of right breast and nipple: Secondary | ICD-10-CM | POA: Diagnosis not present

## 2019-05-19 DIAGNOSIS — Z79811 Long term (current) use of aromatase inhibitors: Secondary | ICD-10-CM | POA: Diagnosis not present

## 2019-05-19 DIAGNOSIS — E872 Acidosis: Secondary | ICD-10-CM | POA: Diagnosis present

## 2019-05-19 DIAGNOSIS — I4819 Other persistent atrial fibrillation: Secondary | ICD-10-CM | POA: Diagnosis present

## 2019-05-19 DIAGNOSIS — I442 Atrioventricular block, complete: Secondary | ICD-10-CM | POA: Diagnosis present

## 2019-05-19 DIAGNOSIS — I2510 Atherosclerotic heart disease of native coronary artery without angina pectoris: Secondary | ICD-10-CM | POA: Diagnosis present

## 2019-05-19 DIAGNOSIS — Z85828 Personal history of other malignant neoplasm of skin: Secondary | ICD-10-CM | POA: Diagnosis not present

## 2019-05-19 DIAGNOSIS — D801 Nonfamilial hypogammaglobulinemia: Secondary | ICD-10-CM

## 2019-05-19 DIAGNOSIS — Z7984 Long term (current) use of oral hypoglycemic drugs: Secondary | ICD-10-CM | POA: Diagnosis not present

## 2019-05-19 DIAGNOSIS — R110 Nausea: Secondary | ICD-10-CM | POA: Diagnosis not present

## 2019-05-19 DIAGNOSIS — E1122 Type 2 diabetes mellitus with diabetic chronic kidney disease: Secondary | ICD-10-CM | POA: Diagnosis not present

## 2019-05-19 DIAGNOSIS — I428 Other cardiomyopathies: Secondary | ICD-10-CM

## 2019-05-19 DIAGNOSIS — Z8261 Family history of arthritis: Secondary | ICD-10-CM

## 2019-05-19 DIAGNOSIS — R197 Diarrhea, unspecified: Secondary | ICD-10-CM | POA: Diagnosis not present

## 2019-05-19 DIAGNOSIS — I484 Atypical atrial flutter: Secondary | ICD-10-CM

## 2019-05-19 DIAGNOSIS — Z8249 Family history of ischemic heart disease and other diseases of the circulatory system: Secondary | ICD-10-CM

## 2019-05-19 DIAGNOSIS — Z87440 Personal history of urinary (tract) infections: Secondary | ICD-10-CM | POA: Diagnosis not present

## 2019-05-19 DIAGNOSIS — Z885 Allergy status to narcotic agent status: Secondary | ICD-10-CM

## 2019-05-19 DIAGNOSIS — N1831 Chronic kidney disease, stage 3a: Secondary | ICD-10-CM | POA: Diagnosis not present

## 2019-05-19 DIAGNOSIS — R570 Cardiogenic shock: Secondary | ICD-10-CM | POA: Diagnosis present

## 2019-05-19 DIAGNOSIS — R001 Bradycardia, unspecified: Secondary | ICD-10-CM | POA: Diagnosis not present

## 2019-05-19 DIAGNOSIS — I495 Sick sinus syndrome: Secondary | ICD-10-CM | POA: Diagnosis present

## 2019-05-19 DIAGNOSIS — C778 Secondary and unspecified malignant neoplasm of lymph nodes of multiple regions: Secondary | ICD-10-CM

## 2019-05-19 DIAGNOSIS — E785 Hyperlipidemia, unspecified: Secondary | ICD-10-CM | POA: Diagnosis not present

## 2019-05-19 DIAGNOSIS — Z7901 Long term (current) use of anticoagulants: Secondary | ICD-10-CM | POA: Diagnosis not present

## 2019-05-19 DIAGNOSIS — Z79899 Other long term (current) drug therapy: Secondary | ICD-10-CM | POA: Diagnosis not present

## 2019-05-19 DIAGNOSIS — I4892 Unspecified atrial flutter: Secondary | ICD-10-CM | POA: Diagnosis present

## 2019-05-19 DIAGNOSIS — E875 Hyperkalemia: Secondary | ICD-10-CM | POA: Diagnosis not present

## 2019-05-19 DIAGNOSIS — Z20822 Contact with and (suspected) exposure to covid-19: Secondary | ICD-10-CM | POA: Diagnosis present

## 2019-05-19 DIAGNOSIS — Z950 Presence of cardiac pacemaker: Secondary | ICD-10-CM | POA: Diagnosis not present

## 2019-05-19 DIAGNOSIS — Z833 Family history of diabetes mellitus: Secondary | ICD-10-CM

## 2019-05-19 DIAGNOSIS — Z7951 Long term (current) use of inhaled steroids: Secondary | ICD-10-CM

## 2019-05-19 DIAGNOSIS — Z95818 Presence of other cardiac implants and grafts: Secondary | ICD-10-CM

## 2019-05-19 DIAGNOSIS — I129 Hypertensive chronic kidney disease with stage 1 through stage 4 chronic kidney disease, or unspecified chronic kidney disease: Secondary | ICD-10-CM | POA: Diagnosis present

## 2019-05-19 DIAGNOSIS — M8580 Other specified disorders of bone density and structure, unspecified site: Secondary | ICD-10-CM | POA: Diagnosis present

## 2019-05-19 DIAGNOSIS — R579 Shock, unspecified: Secondary | ICD-10-CM | POA: Diagnosis present

## 2019-05-19 DIAGNOSIS — R000 Tachycardia, unspecified: Secondary | ICD-10-CM | POA: Diagnosis not present

## 2019-05-19 DIAGNOSIS — I480 Paroxysmal atrial fibrillation: Secondary | ICD-10-CM | POA: Diagnosis not present

## 2019-05-19 DIAGNOSIS — Z01818 Encounter for other preprocedural examination: Secondary | ICD-10-CM | POA: Diagnosis not present

## 2019-05-19 DIAGNOSIS — Z803 Family history of malignant neoplasm of breast: Secondary | ICD-10-CM

## 2019-05-19 DIAGNOSIS — E039 Hypothyroidism, unspecified: Secondary | ICD-10-CM | POA: Diagnosis present

## 2019-05-19 DIAGNOSIS — J90 Pleural effusion, not elsewhere classified: Secondary | ICD-10-CM | POA: Diagnosis not present

## 2019-05-19 DIAGNOSIS — I959 Hypotension, unspecified: Secondary | ICD-10-CM | POA: Diagnosis not present

## 2019-05-19 DIAGNOSIS — Z8572 Personal history of non-Hodgkin lymphomas: Secondary | ICD-10-CM

## 2019-05-19 DIAGNOSIS — Z79890 Hormone replacement therapy (postmenopausal): Secondary | ICD-10-CM | POA: Diagnosis not present

## 2019-05-19 DIAGNOSIS — I459 Conduction disorder, unspecified: Secondary | ICD-10-CM

## 2019-05-19 DIAGNOSIS — Z91013 Allergy to seafood: Secondary | ICD-10-CM

## 2019-05-19 DIAGNOSIS — E0821 Diabetes mellitus due to underlying condition with diabetic nephropathy: Secondary | ICD-10-CM | POA: Diagnosis not present

## 2019-05-19 DIAGNOSIS — Z823 Family history of stroke: Secondary | ICD-10-CM

## 2019-05-19 DIAGNOSIS — Z882 Allergy status to sulfonamides status: Secondary | ICD-10-CM

## 2019-05-19 LAB — BASIC METABOLIC PANEL
Anion gap: 11 (ref 5–15)
BUN: 17 mg/dL (ref 8–23)
CO2: 18 mmol/L — ABNORMAL LOW (ref 22–32)
Calcium: 8.3 mg/dL — ABNORMAL LOW (ref 8.9–10.3)
Chloride: 108 mmol/L (ref 98–111)
Creatinine, Ser: 1.29 mg/dL — ABNORMAL HIGH (ref 0.44–1.00)
GFR calc Af Amer: 45 mL/min — ABNORMAL LOW (ref >60)
GFR calc non Af Amer: 39 mL/min — ABNORMAL LOW (ref >60)
Glucose, Bld: 200 mg/dL — ABNORMAL HIGH (ref 70–99)
Potassium: 4.8 mmol/L (ref 3.5–5.1)
Sodium: 137 mmol/L (ref 135–145)

## 2019-05-19 LAB — URINALYSIS, COMPLETE (UACMP) WITH MICROSCOPIC
Bilirubin Urine: NEGATIVE
Glucose, UA: NEGATIVE mg/dL
Hgb urine dipstick: NEGATIVE
Ketones, ur: NEGATIVE mg/dL
Leukocytes,Ua: NEGATIVE
Nitrite: NEGATIVE
Protein, ur: 30 mg/dL — AB
Specific Gravity, Urine: 1.012 (ref 1.005–1.030)
pH: 5 (ref 5.0–8.0)

## 2019-05-19 LAB — TROPONIN I (HIGH SENSITIVITY)
Troponin I (High Sensitivity): 10 ng/L (ref <18)
Troponin I (High Sensitivity): 11 ng/L (ref <18)

## 2019-05-19 LAB — CBC
HCT: 33.7 % — ABNORMAL LOW (ref 36.0–46.0)
Hemoglobin: 10.8 g/dL — ABNORMAL LOW (ref 12.0–15.0)
MCH: 32.5 pg (ref 26.0–34.0)
MCHC: 32 g/dL (ref 30.0–36.0)
MCV: 101.5 fL — ABNORMAL HIGH (ref 80.0–100.0)
Platelets: 182 10*3/uL (ref 150–400)
RBC: 3.32 MIL/uL — ABNORMAL LOW (ref 3.87–5.11)
RDW: 13.7 % (ref 11.5–15.5)
WBC: 12.2 10*3/uL — ABNORMAL HIGH (ref 4.0–10.5)
nRBC: 0 % (ref 0.0–0.2)

## 2019-05-19 LAB — RESPIRATORY PANEL BY RT PCR (FLU A&B, COVID)
Influenza A by PCR: NEGATIVE
Influenza B by PCR: NEGATIVE
SARS Coronavirus 2 by RT PCR: NEGATIVE

## 2019-05-19 LAB — ABO/RH: ABO/RH(D): O POS

## 2019-05-19 LAB — LACTIC ACID, PLASMA
Lactic Acid, Venous: 4 mmol/L (ref 0.5–1.9)
Lactic Acid, Venous: 2.5 mmol/L (ref 0.5–1.9)

## 2019-05-19 LAB — PROTIME-INR
INR: 2.7 — ABNORMAL HIGH (ref 0.8–1.2)
Prothrombin Time: 28.8 seconds — ABNORMAL HIGH (ref 11.4–15.2)

## 2019-05-19 LAB — TYPE AND SCREEN
ABO/RH(D): O POS
Antibody Screen: NEGATIVE

## 2019-05-19 MED ORDER — SODIUM CHLORIDE 0.9 % IV BOLUS
1000.0000 mL | Freq: Once | INTRAVENOUS | Status: AC
  Administered 2019-05-19: 1000 mL via INTRAVENOUS

## 2019-05-19 MED ORDER — METOCLOPRAMIDE HCL 5 MG/ML IJ SOLN
10.0000 mg | Freq: Once | INTRAMUSCULAR | Status: AC
  Administered 2019-05-19: 10 mg via INTRAVENOUS
  Filled 2019-05-19: qty 2

## 2019-05-19 MED ORDER — ATROPINE SULFATE 1 MG/10ML IJ SOSY
1.0000 mg | PREFILLED_SYRINGE | Freq: Once | INTRAMUSCULAR | Status: AC
  Administered 2019-05-19: 1 mg via INTRAVENOUS

## 2019-05-19 MED ORDER — SODIUM CHLORIDE 0.9% FLUSH
3.0000 mL | Freq: Once | INTRAVENOUS | Status: AC
  Administered 2019-05-19: 3 mL via INTRAVENOUS

## 2019-05-19 MED ORDER — ONDANSETRON HCL 4 MG/2ML IJ SOLN
4.0000 mg | Freq: Once | INTRAMUSCULAR | Status: AC
  Administered 2019-05-19: 4 mg via INTRAVENOUS
  Filled 2019-05-19: qty 2

## 2019-05-19 MED ORDER — SODIUM CHLORIDE 0.9 % IV SOLN: INTRAVENOUS | Status: AC

## 2019-05-19 MED ORDER — DOPAMINE-DEXTROSE 3.2-5 MG/ML-% IV SOLN
0.0000 ug/kg/min | INTRAVENOUS | Status: DC
  Administered 2019-05-19: 5 ug/kg/min via INTRAVENOUS
  Filled 2019-05-19: qty 250

## 2019-05-19 NOTE — ED Triage Notes (Signed)
Pt came in [facility] with c/o of fall. No LOC and states she did not hit her head. Pt is on a blood thinner. Pt states her knees gave out. BP 86/46, HR 40's with EMS.  20GIV, 4mg  Zofran

## 2019-05-19 NOTE — ED Notes (Signed)
Pt received 581ml NS PTA

## 2019-05-19 NOTE — H&P (Addendum)
CARDIOLOGY ADMIT NOTE   Patient ID: Patty [name] MRN: [id number] DOB/AGE: 1940/03/29 79 y.o.  Admit date: 05/19/2019 Primary Physician:  Deland Pretty, MD  Patient ID: [name], female    DOB: 17-May-1940, 79 y.o.   MRN: [id number]  Chief Complaint  Patient presents with  . Fall   HPI:    MAKENSEY REGO  is a 79 y.o. Paroxysmal symptomatic atrial flutter/atrial fibrillation, S/P  ablation on 08/19/2015 and again on 04/14/2018 by Dr. Thompson Grayer, back in atrial fibrillation in March 2020 which she underwent direct current cardioversion. She has h/o lymphoma treated in 2015 and is in remission, right breast cancer, status post lumpectomy in 2017 followed by chemotherapy therapy. She is presently on long-term anticoagulation. Medical history is also significant for controlled diabetes mellitus, hyperlipidemia, hypertension.   Her last admission to the emergency was on 03/23/2019 when she presented with A. fib with RVR and hypotension but was found to have severe UTI, converted spontaneously to sinus rhythm and is discharged home on antibiotic therapy.  She is now admitted to the hospital via emergency room, activated EMS after she returned home from a day trip, felt extremely fatigued and weak, felt she could not stand, EMS was activated and was found to be hypotensive and brought to the emergency room.  In the ED she was found to be bradycardic with a heart rate around 50 bpm and systolic blood pressure around 70 mmHg, with elevated lactic acid.  Received IV fluids and as she continued to be hypotensive was started on 2.5 mcg of dopamine both for hypotension and bradycardia.  She is now admitted for further evaluation.  States that she is feeling much better since receiving IV fluids.  Denies fever or chills, denies any burning sensation while urinating, no frequency, no chest pain or palpitations.  Has been compliant with all her medications.  Past Medical History:  Diagnosis Date   . Acute on chronic combined systolic and diastolic CHF (congestive heart failure) ([place])   . Arthritis   . Asthma   . Atrial flutter (HCC)    PAROXYSMAL, EKG 10/06/2017, ATRIAL FLUTTER (ATYPICAL) WITH VARIABLE VENTRICULAR RESPONCE AND CONTROLLED  . Atrial flutter, paroxysmal ([place]) 06/02/2018  . Breast cancer, right ([place]) 2/17   R sided, s/p total mastectomy  . Bulging lumbar disc    3  . Chronic kidney disease    recurring UTI  . Complication of anesthesia    "morphine made me stop breathing"  . Concentric left ventricular hypertrophy    MILD DECREASE IN GLOBAL WALL MOTION. DOPPLER EVIDENCE OF GRADE II (PSEUDONORMAL) DIASTOLIC DYSFUNCTION, ELEVATED LAP, CALCULATED EF 45%, LEFT ATRIAL CAVITY IS MILDLY DILATED. MILD TO MODERATE AORTIC REGURGITATION. MILD (GRADE I) MITRAL REGURGITATION. MILD TRICUSPID REGURGITATION. ESTIMATED PULMONARY ARTERY SYSTOLIC PRESSURE 22 MMHG.  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 First degree AV block   . HCAP (healthcare-associated pneumonia) 12/2013   Archie Endo 01/18/2014  . History of cardioversion 06/02/2018  . History of skin cancer   . History of transfusion   . Hypertension   . Hypothyroidism   . Left atrial enlargement   . Leg swelling   . 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 Malaise and fatigue   . Nocturia   . Nonischemic cardiomyopathy ([place]) 06/02/2018  . Paroxysmal atrial fibrillation (HCC)   . Persistent atrial fibrillation ([place])   . Pure hypercholesterolemia   . Shortness  of breath dyspnea    "at times"  . Skin cancer   . Sleep apnea    uses c-pap  . Type II diabetes mellitus ([place])    Past Surgical History:  Procedure Laterality Date  . ABDOMINAL HYSTERECTOMY  1972   partial  . ABDOMINAL HYSTERECTOMY    . APPENDECTOMY  1953  . ATRIAL FIBRILLATION ABLATION  08/19/2015   PVI with CTI ablation by Dr Rayann Heman  . ATRIAL FIBRILLATION ABLATION N/A 04/14/2018   Procedure: ATRIAL  FIBRILLATION ABLATION;  Surgeon: Thompson Grayer, MD;  Location: [facility];  Service: Cardiovascular;  Laterality: N/A;  . BACK SURGERY     x2  . BIOPSY  02/13/2018   Procedure: BIOPSY;  Surgeon: Ronnette Juniper, MD;  Location: [facility];  Service: Gastroenterology;;  X2=EGD & Colon  . BREAST SURGERY     Right mastectomy  . CARDIAC CATHETERIZATION  10/10/2008   Nonobstructive CAD  . CARDIOVERSION  04/13/2011   Procedure: CARDIOVERSION;  Surgeon: Laverda Page, MD;  Location: [facility];  Service: Cardiovascular;  Laterality: N/A;  . CARDIOVERSION N/A 06/18/2015   Procedure: CARDIOVERSION;  Surgeon: Adrian Prows, MD;  Location[facility];  Service: Cardiovascular;  Laterality: N/A;  . CARDIOVERSION N/A 10/26/2016   Procedure: CARDIOVERSION;  Surgeon: Adrian Prows, MD;  Location: [place];  Service: Cardiovascular;  Laterality: N/A;  . CARDIOVERSION N/A 10/11/2017   Procedure: CARDIOVERSION;  Surgeon: Nigel Mormon, MD;  Location: [facility] ENDOSCOPY;  Service: Cardiovascular;  Laterality: N/A;  . CARDIOVERSION N/A 05/23/2018   Procedure: CARDIOVERSION;  Surgeon: Adrian Prows, MD;  Location: [place];  Service: Cardiovascular;  Laterality: N/A;  . CATARACT EXTRACTION W/ INTRAOCULAR LENS  IMPLANT, BILATERAL Bilateral   . CHOLECYSTECTOMY  1993  . COLONOSCOPY N/A 02/13/2018   Procedure: COLONOSCOPY;  Surgeon: Ronnette Juniper, MD;  Location: [facility];  Service: Gastroenterology;  Laterality: N/A;  . ELECTROPHYSIOLOGIC STUDY N/A 08/19/2015   Procedure: Atrial Fibrillation Ablation;  Surgeon: Thompson Grayer, MD;  Location: [place] [facility];  Service: Cardiovascular;  Laterality: N/A;  . ESOPHAGOGASTRODUODENOSCOPY N/A 02/13/2018   Procedure: ESOPHAGOGASTRODUODENOSCOPY (EGD);  Surgeon: Ronnette Juniper, MD;  Location: [facility];  Service: Gastroenterology;  Laterality: N/A;  . FOOT SURGERY    . IR REMOVAL TUN ACCESS W/ PORT W/O FL MOD SED  04/01/2017  . LUMBAR LAMINECTOMY    . MASS BIOPSY Left  12/07/2013   Procedure: EXCISIONAL BIOPSY LEFT NECK MASS ;  Surgeon: Jerrell Belfast, MD;  Location: [place];  Service: ENT;  Laterality: Left;  Marland Kitchen MASTECTOMY Right 2017   malignant  . POLYPECTOMY  02/13/2018   Procedure: POLYPECTOMY;  Surgeon: Ronnette Juniper, MD;  Location: [facility];  Service: Gastroenterology;;  EGD  . PORTACATH PLACEMENT  12/2013   Archie Endo 01/04/2014  . PUBOVAGINAL SLING    . SKIN TAG REMOVAL  2012   leg  . TEE WITHOUT CARDIOVERSION N/A 08/19/2015   Procedure: TRANSESOPHAGEAL ECHOCARDIOGRAM (TEE);  Surgeon: Larey Dresser, MD;  Location: [facility];  Service: Cardiovascular;  Laterality: N/A;  . TONSILLECTOMY    . TOTAL MASTECTOMY Right 05/27/2015   Procedure: RIGHT TOTAL MASTECTOMY;  Surgeon: Fanny Skates, MD;  Location: [facility];  Service: General;  Laterality: Right;   Social History   Socioeconomic History  . Marital status: Married    Spouse name: Not on file  . Number of children: 2  . Years of education: Not on file  . Highest education level: Not on file  Occupational History  .  Not on file  Tobacco Use  . Smoking status: Never Smoker  . Smokeless tobacco: Never Used  Substance and Sexual Activity  . Alcohol use: No    Alcohol/week: 0.0 standard drinks  . Drug use: No  . Sexual activity: Not Currently    Birth control/protection: Surgical  Other Topics Concern  . Not on file  Social History Narrative   Pt lives in [place] with spouse.   Retired Therapist, sports.  Previously worked in [employer].   Social Determinants of Health   Financial Resource Strain:   . Difficulty of Paying Living Expenses:   Food Insecurity:   . Worried About Charity fundraiser in the Last Year:   . Arboriculturist in the Last Year:   Transportation Needs:   . Film/video editor (Medical):   Marland Kitchen Lack of Transportation (Non-Medical):   Physical Activity:   . Days of Exercise per Week:   . Minutes of Exercise per Session:   Stress:   . Feeling of Stress :   Social  Connections:   . Frequency of Communication with Friends and Family:   . Frequency of Social Gatherings with Friends and Family:   . Attends Religious Services:   . Active Member of Clubs or Organizations:   . Attends Archivist Meetings:   Marland Kitchen Marital Status:   Intimate Partner Violence:   . Fear of Current or Ex-Partner:   . Emotionally Abused:   Marland Kitchen Physically Abused:   . Sexually Abused:    ROS  Review of Systems  Constitution: Positive for malaise/fatigue. Negative for fever.  Cardiovascular: Positive for dyspnea on exertion (chronic). Negative for chest pain and leg swelling.  Musculoskeletal: Positive for muscle weakness.  Gastrointestinal: Negative for melena.  Neurological: Positive for dizziness.  All other systems reviewed and are negative.  Objective   Vitals with BMI 05/19/2019 05/19/2019 05/19/2019  Height - - -  Weight - - -  BMI - - -  Systolic [phone]  Diastolic 54 56 55  Pulse 84 87 89      Physical Exam  Constitutional: She is oriented to person, place, and time. No distress.  Cardiovascular: Normal rate, regular rhythm, normal heart sounds and intact distal pulses. Exam reveals no gallop.  No murmur heard. No leg edema, no JVD.  Pulmonary/Chest: Effort normal and breath sounds normal.  Abdominal: Soft. Bowel sounds are normal.  Neurological: She is alert and oriented to person, place, and time.  Skin: Skin is warm and dry.   Laboratory examination:   Recent Labs    09/21/18 0930 03/16/19 0932 03/23/19 1838 03/23/19 1903 05/19/19 2005  NA   < > 139 135 137 137  K   < > 5.1 4.4 4.4 4.8  CL   < > 100 105 106 108  CO2  --  24 18*  --  18*  GLUCOSE   < > 117* 205* 205* 200*  BUN   < > 22 24* 23 17  CREATININE   < > 0.96 1.40* 1.30* 1.29*  CALCIUM  --  9.6 8.9  --  8.3*  GFRNONAA  --  56* 35*  --  39*  GFRAA  --  65 41*  --  45*   < > = values in this interval not displayed.   estimated creatinine clearance is 32.6 mL/min (A) (by  C-G formula based on SCr of 1.29 mg/dL (H)).  CMP Latest Ref Rng & Units 05/19/2019 03/23/2019 03/23/2019  Glucose 70 - 99 mg/dL 200(H) 205(H) 205(H)  BUN 8 - 23 mg/dL 17 23 24(H)  Creatinine 0.44 - 1.00 mg/dL 1.29(H) 1.30(H) 1.40(H)  Sodium 135 - 145 mmol/L 137 137 135  Potassium 3.5 - 5.1 mmol/L 4.8 4.4 4.4  Chloride 98 - 111 mmol/L 108 106 105  CO2 22 - 32 mmol/L 18(L) - 18(L)  Calcium 8.9 - 10.3 mg/dL 8.3(L) - 8.9  Total Protein 6.5 - 8.1 g/dL - - 5.4(L)  Total Bilirubin 0.3 - 1.2 mg/dL - - 0.7  Alkaline Phos 38 - 126 U/L - - 91  AST 15 - 41 U/L - - 32  ALT 0 - 44 U/L - - 26   CBC Latest Ref Rng & Units 05/19/2019 04/26/2019 03/23/2019  WBC 4.0 - 10.5 K/uL 12.2(H) 10.4 -  Hemoglobin 12.0 - 15.0 g/dL 10.8(L) 12.1 10.5(L)  Hematocrit 36.0 - 46.0 % 33.7(L) 37.0 31.0(L)  Platelets 150 - 400 K/uL 182 209 -   Lipid Panel     Component Value Date/Time   CHOL 123 03/16/2019 0932   TRIG 103 03/16/2019 0932   HDL 37 (L) 03/16/2019 0932   CHOLHDL 2.9 05/05/2010 0200   VLDL 33 05/05/2010 0200   LDLCALC 67 03/16/2019 0932   HEMOGLOBIN A1C Lab Results  Component Value Date   HGBA1C 5.8 (H) 03/16/2019   MPG 123 05/23/2015   TSH Recent Labs    03/16/19 0932 03/23/19 1839  TSH 2.860 6.601*   BNP (last 3 results) No results for input(s): BNP in the last 8760 hours.  Medications and allergies   Allergies  Allergen Reactions  . Morphine And Related Anaphylaxis and Other (See Comments)    Pt states she stopped breathing post op- "went into resp. arrest"  . Multaq [Dronedarone] Other (See Comments)    Reaction:  Blood in urine and elevated liver enzymes.   . Demerol Nausea And Vomiting  . Flecainide Other (See Comments)    Mobitz II AV Block  . Oysters [Shellfish Allergy] Rash    & CLAMS  . Penicillins Itching and Rash    Has patient had a PCN reaction causing immediate rash, facial/tongue/throat swelling, SOB or lightheadedness with hypotension: No Has patient had a PCN  reaction causing severe rash involving mucus membranes or skin necrosis: Yes Has patient had a PCN reaction that required hospitalization No Has patient had a PCN reaction occurring within the last 10 years: No If all of the above answers are "NO", then may proceed with Cephalosporin use.   . Sulfa Drugs Cross Reactors Rash    . sodium chloride 125 mL/hr at 05/19/19 2033  . DOPamine 10 mcg/kg/min (05/19/19 2045)    Current Outpatient Medications  Medication Instructions  . albuterol (PROVENTIL HFA;VENTOLIN HFA) 108 (90 Base) MCG/ACT inhaler 1-2 puffs, Inhalation, Every 6 hours PRN  . Alpha-Lipoic Acid 300 mg, Oral, 2 times daily  . anastrozole (ARIMIDEX) 1 mg, Oral, Daily  . B-12 1,000 mcg, Oral, Daily with lunch  . diltiazem (CARDIZEM CD) 120 MG 24 hr capsule TAKE 1 CAPSULE BY MOUTH EVERY DAY  . Fluticasone-Salmeterol (ADVAIR) 250-50 MCG/DOSE AEPB 1 puff, Inhalation, Every 12 hours  . gabapentin (NEURONTIN) 600 mg, Oral, 2 times daily  . levothyroxine (SYNTHROID) 200 mcg, Oral, Daily before breakfast  . Magnesium Oxide 500 mg, Oral, Daily at bedtime  . metFORMIN (GLUCOPHAGE-XR) 500 mg, Oral, 2 times daily with meals  . metoprolol succinate (TOPROL-XL) 12.5 mg, Oral, Daily  . metoprolol succinate (TOPROL-XL) 25 mg, Oral,  Every evening, Take with or immediately following a meal.  . montelukast (SINGULAIR) 10 mg, Oral, Daily  . omega-3 acid ethyl esters (LOVAZA) 1 g, Oral, 2 times daily  . Probiotic Product (PROBIOTIC PO) 1 tablet, Oral, Daily  . Prolia 60 mg, Intravenous, Every 6 months  . traMADol-acetaminophen (ULTRACET) 37.5-325 MG tablet 1 tablet, Oral, Every 6 hours PRN  . triamterene-hydrochlorothiazide (MAXZIDE-25) 37.5-25 MG tablet 1 tablet, Oral, Daily PRN  . Vitamin D 2,000 Units, Oral, Daily with lunch  . XARELTO 20 MG TABS tablet TAKE 1 TABLET (20 MG TOTAL) BY MOUTH DAILY WITH SUPPER.    No intake/output data recorded. No intake/output data recorded.   Radiology:  No  results found.  Cardiac Studies:   Coronary angiogram 2010 (no stents-Dr. Peter Martinique)  Mild coronary calcification, Normal EF. Admitted to [facility] in March 2012 for A. Fibrillation with RVR. Stress cardiolite 3/12 [facility]) no ischemia .   Echocardiogram 04/05/2019:  Left ventricle cavity is normal in size. Moderate concentric hypertrophy  of the left ventricle. Normal global wall motion. Normal LV systolic  function with EF 56%. Unable to evaluate diastolic function due to atrial  fibrillation.  Left atrial cavity is moderately dilated.  Trileaflet aortic valve. Moderate (Grade II) aortic regurgitation.  Moderate (Grade II) mitral regurgitation.  Moderate tricuspid regurgitation. Estimated pulmonary artery systolic  pressure is 30 mmHg, increased from 22 mmHg on previous study on  11/09/2017.   EKG:  EKG 05/19/2019 at 1952 hrs.: Sinus arrest with junctional escape rhythm at the rate of 37 bpm, inferior infarct old, anteroseptal infarct old.  Low-voltage complexes.  Assessment   1. Sick sinus syndrome 2. Hypotensive shock and metabolic acidosis due to bradycardia. 3. Frequent UTI 4. PAFL/A. Fib CHA2DS2-VASc Score is 6.  Yearly risk of stroke: >9% (A, F, HTN, DM, Ao Atherosclerosis).    (Unable to tolerate Multaq due to abnorma LFT. Flecainide caused hight degree AV Block. Consider Tikosyn if recurrent A. FIb James Allred) 5. Chronic stage 3 CKD stage a.    Recommendations:   Start Dop 2.5 mcg. Hold Metop and Dilt Check UA and C & S.  Admit to step down. Met daughter and explained possible need for pacemaker. Hold anticoag and start IV heparin for now.  Admit to progressive unit for observation closely, Hold Xarelto until EP sees her for possible consideration for permanent pacemaker. Having difficulty between AFL with RVR and Bradycardia.  Renal function is stable fortunately. Critical care time 40 min.   Adrian Prows, MD, [facility] 05/19/2019, 11:26 PM [facility] [place] Pager: [phone] Office: [phone]

## 2019-05-19 NOTE — ED Notes (Signed)
Date and time results received: 05/19/19 2150  Test: Lactic Acid Critical Value: 4.0  Name of Provider Notified: Vanita Panda, MD  Orders Received? Or Actions Taken?: 1000 NS Bolus

## 2019-05-19 NOTE — ED Notes (Signed)
Family updated as to patient's status.

## 2019-05-19 NOTE — ED Notes (Signed)
Pt placed on zoll monitor

## 2019-05-19 NOTE — ED Notes (Signed)
ICU   [phone] Pts daughter Phylliss Bob would like an update

## 2019-05-19 NOTE — ED Provider Notes (Signed)
[place] [facility] EMERGENCY DEPARTMENT Provider Note   CSN: [id number] Arrival date & time: 05/19/19  1945     History Chief Complaint  Patient presents with  . Fall    Patty [name] is a 79 y.o. female.  HPI     Patient presents via EMS after possible fall.  Patient herself actually states that she lowered herself after feeling weak.  She denies any head trauma, or loss of consciousness.  She is on a blood thinner, it was designated as level 1 triage due to concern for this.  However, the patient notes no new weakness in any extremity confusion, disorientation. However, the patient describes severe weakness, worse with any attempts at ambulation, motion. No pain.  Patient notes that she has been working with her cardiologist on her medication regimen, and recently had halving of her beta-blocker dosage.  Per report patient is also on Cardizem.  Per EMS the patient was bradycardic and hypotensive in route.   Past Medical History:  Diagnosis Date  . Acute on chronic combined systolic and diastolic CHF (congestive heart failure) ([place])   . Arthritis   . Asthma   . Atrial flutter (HCC)    PAROXYSMAL, EKG 10/06/2017, ATRIAL FLUTTER (ATYPICAL) WITH VARIABLE VENTRICULAR RESPONCE AND CONTROLLED  . Atrial flutter, paroxysmal ([place]) 06/02/2018  . Breast cancer, right ([place]) 2/17   R sided, s/p total mastectomy  . Bulging lumbar disc    3  . Chronic kidney disease    recurring UTI  . Complication of anesthesia    "morphine made me stop breathing"  . Concentric left ventricular hypertrophy    MILD DECREASE IN GLOBAL WALL MOTION. DOPPLER EVIDENCE OF GRADE II (PSEUDONORMAL) DIASTOLIC DYSFUNCTION, ELEVATED LAP, CALCULATED EF 45%, LEFT ATRIAL CAVITY IS MILDLY DILATED. MILD TO MODERATE AORTIC REGURGITATION. MILD (GRADE I) MITRAL REGURGITATION. MILD TRICUSPID REGURGITATION. ESTIMATED PULMONARY ARTERY SYSTOLIC PRESSURE 22 MMHG.  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 First degree AV block   . HCAP (healthcare-associated pneumonia) 12/2013   Archie Endo 01/18/2014  . History of cardioversion 06/02/2018  . History of skin cancer   . History of transfusion   . Hypertension   . Hypothyroidism   . Left atrial enlargement   . Leg swelling   . 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 Malaise and fatigue   . Nocturia   . Nonischemic cardiomyopathy ([place]) 06/02/2018  . Paroxysmal atrial fibrillation (HCC)   . Persistent atrial fibrillation ([place])   . Pure hypercholesterolemia   . Shortness of breath dyspnea    "at times"  . Skin cancer   . Sleep apnea    uses c-pap  . Type II diabetes mellitus [facility])     Patient Active Problem List   Diagnosis Date Noted  . Sick sinus syndrome ([place]) 03/23/2019  . Bradycardia 03/23/2019  . Atrial flutter, paroxysmal ([place]) 06/02/2018  . Nonischemic cardiomyopathy ([place]) 06/02/2018  . History of cardioversion 06/02/2018  . Dehydration 12/20/2017  . Nausea & vomiting 12/20/2017  . Iron deficiency anemia 12/15/2016  . 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 Weight gain 04/08/2016  . Left knee pain 12/12/2015  . Lumbar facet arthropathy 08/05/2015  . Left leg weakness 08/05/2015  . Low back pain 08/05/2015  . Diabetic peripheral neuropathy ([place]) 08/05/2015  . Bradykinesia 08/05/2015  . RBBB 07/30/2015  . Essential hypertension 07/30/2015  . Osteopenia 07/11/2015  . Cancer of  right breast ([place]) 05/27/2015  . Breast cancer of upper-inner quadrant of right female breast ([place]) 04/24/2015  . Nausea vomiting and diarrhea 03/23/2015  . Controlled type 2 diabetes mellitus without complication, without long-term current use of insulin ([place]) 03/23/2015  . Dyslipidemia associated with type 2 diabetes mellitus ([place]) 03/23/2015  . 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 Long term current use of anticoagulant therapy 04/10/2014  . UTI (urinary tract  infection) 04/10/2014  . Leukocytosis 03/14/2014  . Anemia in chronic illness 12/28/2013  . 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, face, and neck ([place]) 12/26/2013  . Hypothyroidism 06/15/2010  . Paroxysmal atrial fibrillation ([place]) 05/28/2010    Past Surgical History:  Procedure Laterality Date  . ABDOMINAL HYSTERECTOMY  1972   partial  . ABDOMINAL HYSTERECTOMY    . APPENDECTOMY  1953  . ATRIAL FIBRILLATION ABLATION  08/19/2015   PVI with CTI ablation by Dr Rayann Heman  . ATRIAL FIBRILLATION ABLATION N/A 04/14/2018   Procedure: ATRIAL FIBRILLATION ABLATION;  Surgeon: Thompson Grayer, MD;  Location: [facility] CV LAB;  Service: Cardiovascular;  Laterality: N/A;  . BACK SURGERY     x2  . BIOPSY  02/13/2018   Procedure: BIOPSY;  Surgeon: Ronnette Juniper, MD;  Location: [facility];  Service: Gastroenterology;;  X2=EGD & Colon  . BREAST SURGERY     Right mastectomy  . CARDIAC CATHETERIZATION  10/10/2008   Nonobstructive CAD  . CARDIOVERSION  04/13/2011   Procedure: CARDIOVERSION;  Surgeon: Laverda Page, MD;  Location: [facility];  Service: Cardiovascular;  Laterality: N/A;  . CARDIOVERSION N/A 06/18/2015   Procedure: CARDIOVERSION;  Surgeon: Adrian Prows, MD;  Location: [facility] ENDOSCOPY;  Service: Cardiovascular;  Laterality: N/A;  . CARDIOVERSION N/A 10/26/2016   Procedure: CARDIOVERSION;  Surgeon: Adrian Prows, MD;  Location: [facility];  Service: Cardiovascular;  Laterality: N/A;  . CARDIOVERSION N/A 10/11/2017   Procedure: CARDIOVERSION;  Surgeon: Nigel Mormon, MD;  Location: [facility] ENDOSCOPY;  Service: Cardiovascular;  Laterality: N/A;  . CARDIOVERSION N/A 05/23/2018   Procedure: CARDIOVERSION;  Surgeon: Adrian Prows, MD;  Location: [facility];  Service: Cardiovascular;  Laterality: N/A;  . CATARACT EXTRACTION W/ INTRAOCULAR LENS  IMPLANT, BILATERAL Bilateral   . CHOLECYSTECTOMY  1993  . COLONOSCOPY N/A 02/13/2018   Procedure: COLONOSCOPY;  Surgeon: Ronnette Juniper, MD;  Location: [facility] ENDOSCOPY;   Service: Gastroenterology;  Laterality: N/A;  . ELECTROPHYSIOLOGIC STUDY N/A 08/19/2015   Procedure: Atrial Fibrillation Ablation;  Surgeon: Thompson Grayer, MD;  Location: [facility] CV LAB;  Service: Cardiovascular;  Laterality: N/A;  . ESOPHAGOGASTRODUODENOSCOPY N/A 02/13/2018   Procedure: ESOPHAGOGASTRODUODENOSCOPY (EGD);  Surgeon: Ronnette Juniper, MD;  Location: [facility];  Service: Gastroenterology;  Laterality: N/A;  . FOOT SURGERY    . IR REMOVAL TUN ACCESS W/ PORT W/O FL MOD SED  04/01/2017  . LUMBAR LAMINECTOMY    . MASS BIOPSY Left 12/07/2013   Procedure: EXCISIONAL BIOPSY LEFT NECK MASS ;  Surgeon: Jerrell Belfast, MD;  Location: [facility];  Service: ENT;  Laterality: Left;  Marland Kitchen MASTECTOMY Right 2017   malignant  . POLYPECTOMY  02/13/2018   Procedure: POLYPECTOMY;  Surgeon: Ronnette Juniper, MD;  Location: [facility] ENDOSCOPY;  Service: Gastroenterology;;  EGD  . PORTACATH PLACEMENT  12/2013   Archie Endo 01/04/2014  . PUBOVAGINAL SLING    . SKIN TAG REMOVAL  2012   leg  . TEE WITHOUT CARDIOVERSION N/A 08/19/2015   Procedure: TRANSESOPHAGEAL ECHOCARDIOGRAM (TEE);  Surgeon: Larey [facility]er, MD;  Location: [facility];  Service: Cardiovascular;  Laterality: N/A;  . TONSILLECTOMY    . TOTAL MASTECTOMY Right 05/27/2015   Procedure: RIGHT TOTAL MASTECTOMY;  Surgeon: Fanny Skates, MD;  Location: [facility];  Service: General;  Laterality: Right;     OB History    Gravida  2   Para  2   Term  2   Preterm      AB      Living  2     SAB      TAB      Ectopic      Multiple      Live Births              Family History  Problem Relation Age of Onset  . Stroke Father 52  . Heart attack Father 22  . Hypertension Father   . Heart disease Mother   . Arthritis Mother   . Breast cancer Mother 39  . Heart failure Mother   . Heart attack Brother   . Hypertension Brother   . Diabetes Sister   . Cancer Sister 52    Social History   Tobacco Use  . Smoking status: Never Smoker  .  Smokeless tobacco: Never Used  Substance Use Topics  . Alcohol use: No    Alcohol/week: 0.0 standard drinks  . Drug use: No    Home Medications Prior to Admission medications   Medication Sig Start Date End Date Taking? Authorizing Provider  albuterol (PROVENTIL HFA;VENTOLIN HFA) 108 (90 Base) MCG/ACT inhaler Inhale 1-2 puffs into the lungs every 6 (six) hours as needed for wheezing or shortness of breath.    Yes [provider]  Alpha-Lipoic Acid 300 MG TABS Take 300 mg by mouth 2 (two) times daily.    Yes [provider]  anastrozole (ARIMIDEX) 1 MG tablet Take 1 tablet (1 mg total) by mouth daily. 09/18/18  Yes Heath Lark, MD  Cholecalciferol (VITAMIN D) 2000 UNITS tablet Take 2,000 Units by mouth daily with lunch.   Yes [provider]  Cyanocobalamin (B-12) 1000 MCG TABS Take 1,000 mcg by mouth daily with lunch.    Yes [provider]  diltiazem (CARDIZEM CD) 120 MG 24 hr capsule TAKE 1 CAPSULE BY MOUTH EVERY DAY Patient taking differently: Take 120 mg by mouth daily.  02/14/19  Yes Adrian Prows, MD  Fluticasone-Salmeterol (ADVAIR) 250-50 MCG/DOSE AEPB Inhale 1 puff into the lungs every 12 (twelve) hours.    Yes [provider]  gabapentin (NEURONTIN) 600 MG tablet Take 600 mg by mouth 2 (two) times daily. 02/26/19  Yes [provider]  levothyroxine (SYNTHROID, LEVOTHROID) 200 MCG tablet Take 200 mcg by mouth daily before breakfast.   Yes [provider]  Magnesium Oxide 500 MG TABS Take 500 mg by mouth at bedtime.    Yes [provider]  metFORMIN (GLUCOPHAGE-XR) 500 MG 24 hr tablet Take 500 mg by mouth 2 (two) times daily with a meal.   Yes [provider]  metoprolol succinate (TOPROL-XL) 50 MG 24 hr tablet Take 25 mg by mouth every evening. Take with or immediately following a meal.   Yes [provider]  montelukast (SINGULAIR) 10 MG tablet Take 10 mg by mouth daily.   Yes [provider]  omega-3 acid ethyl esters (LOVAZA) 1 g capsule Take 1 g by mouth 2 (two) times daily.   Yes [provider]  Probiotic Product (PROBIOTIC PO) Take 1 tablet by mouth daily.   Yes [provider]  PROLIA 60 MG/ML SOSY injection Inject 60 mg into the vein every 6 (six) months. 07/18/17  Yes [provider]  traMADol-acetaminophen (ULTRACET) 37.5-325 MG tablet Take 1 tablet by mouth every 6 (six) hours as needed (pain).   Yes [provider]  triamterene-hydrochlorothiazide (MAXZIDE-25) 37.5-25 MG tablet Take 1 tablet by mouth daily as needed (for fluid retention/swelling.).  07/29/16  Yes [provider]  XARELTO 20 MG TABS tablet TAKE 1 TABLET (20 MG TOTAL) BY MOUTH DAILY WITH SUPPER. Patient taking differently: Take 20 mg by mouth daily with supper.  05/14/19  Yes Adrian Prows, MD  metoprolol succinate (TOPROL-XL) 25 MG 24 hr tablet Take 0.5 tablets (12.5 mg total) by mouth daily. Patient not taking: Reported on 05/19/2019 04/26/19 07/25/19  Heath Lark, MD    Allergies    Morphine and related, Multaq [dronedarone], Demerol, Flecainide, Oysters [shellfish allergy], Penicillins, and Sulfa drugs cross reactors  Review of Systems   Review of Systems  Constitutional:       Per HPI, otherwise negative  HENT:       Per HPI, otherwise negative  Respiratory:       Per HPI, otherwise negative  Cardiovascular:       Per HPI, otherwise negative  Gastrointestinal: Negative for vomiting.  Endocrine:       Negative aside from HPI  Genitourinary:       Neg aside from HPI   Musculoskeletal:       Per HPI, otherwise negative  Skin: Negative.   Neurological: Positive for weakness and light-headedness. Negative for syncope.    Physical Exam Updated Vital Signs BP (!) 116/54   Pulse 84   Temp (!) 97.5 F (36.4 C) (Oral)   Resp 12   Ht 5\' 6"  (1.676 m)   Wt 63.5 kg   SpO2 96%   BMI 22.60 kg/m   Physical Exam Vitals and nursing note reviewed.    Constitutional:      Appearance: She is well-developed. She is ill-appearing.  HENT:     Head: Normocephalic and atraumatic.  Eyes:     Conjunctiva/sclera: Conjunctivae normal.  Cardiovascular:     Rate and Rhythm: Regular rhythm. Bradycardia present.  Pulmonary:     Effort: Pulmonary effort is normal. No respiratory distress.     Breath sounds: Normal breath sounds. No stridor.  Abdominal:     General: There is no distension.  Skin:    General: Skin is warm and dry.  Neurological:     Mental Status: She is alert and oriented to person, place, and time.     Cranial Nerves: No cranial nerve deficit.     ED Results / Procedures / Treatments   Labs (all labs ordered are listed, but only abnormal results are displayed) Labs Reviewed  BASIC METABOLIC PANEL - Abnormal; Notable for the following components:      Result Value   CO2 18 (*)    Glucose, Bld 200 (*)    Creatinine, Ser 1.29 (*)    Calcium 8.3 (*)    GFR calc non Af Amer 39 (*)    GFR calc Af Amer 45 (*)    All other components within normal limits  CBC - Abnormal; Notable for the following components:   WBC 12.2 (*)    RBC 3.32 (*)    Hemoglobin 10.8 (*)    HCT 33.7 (*)    MCV 101.5 (*)    All other components within normal limits  PROTIME-INR - Abnormal; Notable  for the following components:   Prothrombin Time 28.8 (*)    INR 2.7 (*)    All other components within normal limits  LACTIC ACID, PLASMA - Abnormal; Notable for the following components:   Lactic Acid, Venous 4.0 (*)    All other components within normal limits  LACTIC ACID, PLASMA - Abnormal; Notable for the following components:   Lactic Acid, Venous 2.5 (*)    All other components within normal limits  RESPIRATORY PANEL BY RT PCR (FLU A&B, COVID)  URINE CULTURE  URINALYSIS, COMPLETE (UACMP) WITH MICROSCOPIC  TYPE AND SCREEN  ABO/RH  TROPONIN I (HIGH SENSITIVITY)  TROPONIN I (HIGH SENSITIVITY)    EKG EKG  Interpretation  Date/Time:  Saturday May 19 2019 19:52:07 EDT Ventricular Rate:  37 PR Interval:    QRS Duration: 107 QT Interval:  504 QTC Calculation: 396 R Axis:   -80 Text Interpretation: Junctional rhythm Inferior infarct, old Probable anterior infarct, age indeterminate Abnormal ECG Confirmed by Carmin Muskrat [phone]) on 05/19/2019 8:35:23 PM   Procedures Procedures (including critical care time)  CRITICAL CARE Performed by: Carmin Muskrat Total critical care time: 45 minutes Critical care time was exclusive of separately billable procedures and treating other patients. Critical care was necessary to treat or prevent imminent or life-threatening deterioration. Critical care was time spent personally by me on the following activities: development of treatment plan with patient and/or surrogate as well as nursing, discussions with consultants, evaluation of patient's response to treatment, examination of patient, obtaining history from patient or surrogate, ordering and performing treatments and interventions, ordering and review of laboratory studies, ordering and review of radiographic studies, pulse oximetry and re-evaluation of patient's condition.   Medications Ordered in ED Medications  0.9 %  sodium chloride infusion ( Intravenous New Bag/Given 05/19/19 2033)  DOPamine (INTROPIN) 800 mg in dextrose 5 % 250 mL (3.2 mg/mL) infusion (10 mcg/kg/min  63.5 kg Intravenous Rate/Dose Change 05/19/19 2045)  sodium chloride 0.9 % bolus 1,000 mL (0 mLs Intravenous Stopped 05/19/19 2029)  sodium chloride flush (NS) 0.9 % injection 3 mL (3 mLs Intravenous Given 05/19/19 2019)  atropine 1 MG/10ML injection 1 mg (1 mg Intravenous Given 05/19/19 2033)  ondansetron (ZOFRAN) injection 4 mg (4 mg Intravenous Given 05/19/19 2051)  metoCLOPramide (REGLAN) injection 10 mg (10 mg Intravenous Given 05/19/19 2107)  sodium chloride 0.9 % bolus 1,000 mL (0 mLs Intravenous Stopped 05/19/19 2240)    ED  Course  I have reviewed the triage vital signs and the nursing notes.  Pertinent labs & imaging results that were available during my care of the patient were reviewed by me and considered in my medical decision making (see chart for details).   With immediate concern for the patient's bradycardia, hypotension, she was placed on continuous cardiac monitoring, resuscitation pads, fluids started.  Update: Patient's blood pressure 75/45, heart rate 30s, escape rhythm.  Given the initial concern for symptomatic bradycardia discussed her case early with her cardiologist.  Patient will be admitted to the ICU, started on dopamine.  Update: Patient continues to receive fluids, dopamine started.  Blood pressure starting to improve.   Update:, Labs notable for elevated lactic acid level.  Update:, Heart rate now in the 80s, blood pressure normalized, second lactic acid level normal.  This elderly female with multiple medical issues presents with weakness, lightheadedness, after presumed fall, that does not seem to have occurred.  Patient is awake and alert, though weak in appearance.  Patient found to have critically abnormal  vital signs, possibly secondary to her ongoing medication regimen.  Patient improved with fluid resuscitation, initiation of continuous dopamine drip.  Given these, the patient required admission to the ICU for further monitoring, management.  Final Clinical Impression(s) / ED Diagnoses Final diagnoses:  Symptomatic bradycardia     Carmin Muskrat, MD 05/19/19 2335

## 2019-05-20 DIAGNOSIS — I495 Sick sinus syndrome: Principal | ICD-10-CM

## 2019-05-20 DIAGNOSIS — C778 Secondary and unspecified malignant neoplasm of lymph nodes of multiple regions: Secondary | ICD-10-CM | POA: Insufficient documentation

## 2019-05-20 LAB — MRSA PCR SCREENING: MRSA by PCR: NEGATIVE

## 2019-05-20 LAB — GLUCOSE, CAPILLARY
Glucose-Capillary: 126 mg/dL — ABNORMAL HIGH (ref 70–99)
Glucose-Capillary: 127 mg/dL — ABNORMAL HIGH (ref 70–99)
Glucose-Capillary: 137 mg/dL — ABNORMAL HIGH (ref 70–99)
Glucose-Capillary: 178 mg/dL — ABNORMAL HIGH (ref 70–99)
Glucose-Capillary: 189 mg/dL — ABNORMAL HIGH (ref 70–99)

## 2019-05-20 LAB — CBC
HCT: 31.2 % — ABNORMAL LOW (ref 36.0–46.0)
Hemoglobin: 10.4 g/dL — ABNORMAL LOW (ref 12.0–15.0)
MCH: 32.3 pg (ref 26.0–34.0)
MCHC: 33.3 g/dL (ref 30.0–36.0)
MCV: 96.9 fL (ref 80.0–100.0)
Platelets: 177 10*3/uL (ref 150–400)
RBC: 3.22 MIL/uL — ABNORMAL LOW (ref 3.87–5.11)
RDW: 13.7 % (ref 11.5–15.5)
WBC: 10.7 10*3/uL — ABNORMAL HIGH (ref 4.0–10.5)
nRBC: 0 % (ref 0.0–0.2)

## 2019-05-20 LAB — SURGICAL PCR SCREEN
MRSA, PCR: NEGATIVE
Staphylococcus aureus: NEGATIVE

## 2019-05-20 MED ORDER — ALBUTEROL SULFATE (2.5 MG/3ML) 0.083% IN NEBU: 2.5000 mg | INHALATION_SOLUTION | Freq: Four times a day (QID) | RESPIRATORY_TRACT | Status: DC | PRN

## 2019-05-20 MED ORDER — CHLORHEXIDINE GLUCONATE 4 % EX LIQD
60.0000 mL | Freq: Once | CUTANEOUS | Status: AC
  Administered 2019-05-20: 4 via TOPICAL

## 2019-05-20 MED ORDER — CHLORHEXIDINE GLUCONATE 4 % EX LIQD
60.0000 mL | Freq: Once | CUTANEOUS | Status: AC
  Administered 2019-05-21: 4 via TOPICAL
  Filled 2019-05-20: qty 60

## 2019-05-20 MED ORDER — TRAMADOL-ACETAMINOPHEN 37.5-325 MG PO TABS
1.0000 | ORAL_TABLET | Freq: Four times a day (QID) | ORAL | Status: DC | PRN
  Administered 2019-05-21 – 2019-05-23 (×2): 1 via ORAL
  Filled 2019-05-20 (×2): qty 1

## 2019-05-20 MED ORDER — CIPROFLOXACIN HCL 250 MG PO TABS
250.0000 mg | ORAL_TABLET | Freq: Two times a day (BID) | ORAL | Status: DC
  Administered 2019-05-20 (×2): 250 mg via ORAL
  Filled 2019-05-20 (×3): qty 1

## 2019-05-20 MED ORDER — SODIUM CHLORIDE 0.9% FLUSH
3.0000 mL | Freq: Two times a day (BID) | INTRAVENOUS | Status: DC
  Administered 2019-05-20 – 2019-05-22 (×6): 3 mL via INTRAVENOUS

## 2019-05-20 MED ORDER — OMEGA-3-ACID ETHYL ESTERS 1 G PO CAPS
1.0000 g | ORAL_CAPSULE | Freq: Two times a day (BID) | ORAL | Status: DC
  Administered 2019-05-20 – 2019-05-23 (×8): 1 g via ORAL
  Filled 2019-05-20 (×8): qty 1

## 2019-05-20 MED ORDER — INSULIN ASPART 100 UNIT/ML ~~LOC~~ SOLN
0.0000 [IU] | Freq: Three times a day (TID) | SUBCUTANEOUS | Status: DC
  Administered 2019-05-22: 1 [IU] via SUBCUTANEOUS
  Administered 2019-05-23: 2 [IU] via SUBCUTANEOUS

## 2019-05-20 MED ORDER — VITAMIN D 25 MCG (1000 UNIT) PO TABS
2000.0000 [IU] | ORAL_TABLET | Freq: Every day | ORAL | Status: DC
  Administered 2019-05-20 – 2019-05-23 (×3): 2000 [IU] via ORAL
  Filled 2019-05-20 (×3): qty 2

## 2019-05-20 MED ORDER — TRIAMTERENE-HCTZ 37.5-25 MG PO TABS
1.0000 | ORAL_TABLET | Freq: Every day | ORAL | Status: DC | PRN
  Filled 2019-05-20: qty 1

## 2019-05-20 MED ORDER — SODIUM CHLORIDE 0.9% FLUSH: 3.0000 mL | INTRAVENOUS | Status: DC | PRN

## 2019-05-20 MED ORDER — MONTELUKAST SODIUM 10 MG PO TABS
10.0000 mg | ORAL_TABLET | Freq: Every day | ORAL | Status: DC
  Administered 2019-05-20 – 2019-05-23 (×4): 10 mg via ORAL
  Filled 2019-05-20 (×4): qty 1

## 2019-05-20 MED ORDER — METFORMIN HCL ER 500 MG PO TB24
500.0000 mg | ORAL_TABLET | Freq: Two times a day (BID) | ORAL | Status: DC
  Administered 2019-05-20 – 2019-05-23 (×6): 500 mg via ORAL
  Filled 2019-05-20 (×9): qty 1

## 2019-05-20 MED ORDER — SODIUM CHLORIDE 0.9 % IV SOLN: INTRAVENOUS | Status: DC

## 2019-05-20 MED ORDER — SODIUM CHLORIDE 0.9 % IV SOLN: 250.0000 mL | INTRAVENOUS | Status: DC

## 2019-05-20 MED ORDER — SODIUM CHLORIDE 0.9 % IV SOLN
80.0000 mg | INTRAVENOUS | Status: AC
  Administered 2019-05-21: 80 mg
  Filled 2019-05-20: qty 2

## 2019-05-20 MED ORDER — LEVOTHYROXINE SODIUM 100 MCG PO TABS
200.0000 ug | ORAL_TABLET | Freq: Every day | ORAL | Status: DC
  Administered 2019-05-20 – 2019-05-23 (×4): 200 ug via ORAL
  Filled 2019-05-20 (×4): qty 2

## 2019-05-20 MED ORDER — GABAPENTIN 600 MG PO TABS
600.0000 mg | ORAL_TABLET | Freq: Two times a day (BID) | ORAL | Status: DC
  Administered 2019-05-20 – 2019-05-23 (×8): 600 mg via ORAL
  Filled 2019-05-20 (×8): qty 1

## 2019-05-20 MED ORDER — CHLORHEXIDINE GLUCONATE CLOTH 2 % EX PADS
6.0000 | MEDICATED_PAD | Freq: Every day | CUTANEOUS | Status: DC
  Administered 2019-05-21: 10:00:00 6 via TOPICAL

## 2019-05-20 MED ORDER — VITAMIN B-12 1000 MCG PO TABS
2500.0000 ug | ORAL_TABLET | Freq: Every day | ORAL | Status: DC
  Administered 2019-05-20 – 2019-05-23 (×3): 2500 ug via ORAL
  Filled 2019-05-20 (×3): qty 3

## 2019-05-20 MED ORDER — MAGNESIUM OXIDE 400 (241.3 MG) MG PO TABS
400.0000 mg | ORAL_TABLET | Freq: Two times a day (BID) | ORAL | Status: DC
  Administered 2019-05-20 – 2019-05-23 (×8): 400 mg via ORAL
  Filled 2019-05-20 (×8): qty 1

## 2019-05-20 MED ORDER — ANASTROZOLE 1 MG PO TABS
1.0000 mg | ORAL_TABLET | Freq: Every day | ORAL | Status: DC
  Administered 2019-05-20 – 2019-05-23 (×4): 1 mg via ORAL
  Filled 2019-05-20 (×4): qty 1

## 2019-05-20 MED ORDER — VANCOMYCIN HCL IN DEXTROSE 1-5 GM/200ML-% IV SOLN
1000.0000 mg | INTRAVENOUS | Status: AC
  Administered 2019-05-21: 13:00:00 1000 mg via INTRAVENOUS

## 2019-05-20 NOTE — Plan of Care (Signed)

## 2019-05-20 NOTE — Progress Notes (Signed)
pts husband at bedside visiting.

## 2019-05-20 NOTE — Consult Note (Addendum)
EP Attending   Cardiology Consultation:   Patient ID: Patty [name] MRN: [id number]; DOB: [birth year]  Admit date: 05/19/2019 Date of Consult: 05/20/2019  Primary Care Provider: Deland Pretty, MD Primary Cardiologist: No primary care provider on file. Dr. Einar Gip Primary Electrophysiologist:  None    Patient Profile:   Patty [name] is a 79 y.o. female with a hx of PAF who is being seen today for the evaluation of sinus node dysfunction  at the request of Dr. Einar Gip.  History of Present Illness:   [name] has a h/o PAF and a RVR. She has been on medical therapy with a beta blocker and a calcium channel blocker. She has had atrial fib with a RVR. She was treated for lymphoma a couple of years ago and was doing well until a day or two prior to admit when she developed severe fatigue and weakness. She presented with sinus bradycardia and a junctional escape in the 30-s. She was hypotensive. She was started on dopamine with improvement in her symptoms. By echo she has mild LV dysfunction. She has undergone atrial fib ablation in the past. She has underlying conduction system disease in NSR with a PR of 340.    Past Medical History:  Diagnosis Date  . Acute on chronic combined systolic and diastolic CHF (congestive heart failure) ([place])   . Arthritis   . Asthma   . Atrial flutter (HCC)    PAROXYSMAL, EKG 10/06/2017, ATRIAL FLUTTER (ATYPICAL) WITH VARIABLE VENTRICULAR RESPONCE AND CONTROLLED  . Atrial flutter, paroxysmal ([place]) 06/02/2018  . Breast cancer, right ([place]) 2/17   R sided, s/p total mastectomy  . Bulging lumbar disc    3  . Chronic kidney disease    recurring UTI  . Complication of anesthesia    "morphine made me stop breathing"  . Concentric left ventricular hypertrophy    MILD DECREASE IN GLOBAL WALL MOTION. DOPPLER EVIDENCE OF GRADE II (PSEUDONORMAL) DIASTOLIC DYSFUNCTION, ELEVATED LAP, CALCULATED EF 45%, LEFT ATRIAL CAVITY IS MILDLY DILATED. MILD TO MODERATE AORTIC  REGURGITATION. MILD (GRADE I) MITRAL REGURGITATION. MILD TRICUSPID REGURGITATION. ESTIMATED PULMONARY ARTERY SYSTOLIC PRESSURE 22 MMHG.  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 First degree AV block   . HCAP (healthcare-associated pneumonia) 12/2013   Archie Endo 01/18/2014  . History of cardioversion 06/02/2018  . History of skin cancer   . History of transfusion   . Hypertension   . Hypothyroidism   . Left atrial enlargement   . Leg swelling   . 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 Malaise and fatigue   . Nocturia   . Nonischemic cardiomyopathy ([place]) 06/02/2018  . Paroxysmal atrial fibrillation (HCC)   . Persistent atrial fibrillation ([address])   . Pure hypercholesterolemia   . Shortness of breath dyspnea    "at times"  . Skin cancer   . Sleep apnea    uses c-pap  . Type II diabetes mellitus ([place])     Past Surgical History:  Procedure Laterality Date  . ABDOMINAL HYSTERECTOMY  1972   partial  . ABDOMINAL HYSTERECTOMY    . APPENDECTOMY  1953  . ATRIAL FIBRILLATION ABLATION  08/19/2015   PVI with CTI ablation by Dr Rayann Heman  . ATRIAL FIBRILLATION ABLATION N/A 04/14/2018   Procedure: ATRIAL FIBRILLATION ABLATION;  Surgeon: Thompson Grayer, MD;  Location: [facility];  Service: Cardiovascular;  Laterality: N/A;  . BACK SURGERY  x2  . BIOPSY  02/13/2018   Procedure: BIOPSY;  Surgeon: Ronnette Juniper, MD;  Location: [facility];  Service: Gastroenterology;;  X2=EGD & Colon  . BREAST SURGERY     Right mastectomy  . CARDIAC CATHETERIZATION  10/10/2008   Nonobstructive CAD  . CARDIOVERSION  04/13/2011   Procedure: CARDIOVERSION;  Surgeon: Laverda Page, MD;  Location: [facility];  Service: Cardiovascular;  Laterality: N/A;  . CARDIOVERSION N/A 06/18/2015   Procedure: CARDIOVERSION;  Surgeon: Adrian Prows, MD;  Location: [facility];  Service: Cardiovascular;  Laterality: N/A;  . CARDIOVERSION N/A 10/26/2016   Procedure:  CARDIOVERSION;  Surgeon: Adrian Prows, MD;  Location: [facility];  Service: Cardiovascular;  Laterality: N/A;  . CARDIOVERSION N/A 10/11/2017   Procedure: CARDIOVERSION;  Surgeon: Nigel Mormon, MD;  Location: [facility]COPY;  Service: Cardiovascular;  Laterality: N/A;  . CARDIOVERSION N/A 05/23/2018   Procedure: CARDIOVERSION;  Surgeon: Adrian Prows, MD;  Location: [facility];  Service: Cardiovascular;  Laterality: N/A;  . CATARACT EXTRACTION W/ INTRAOCULAR LENS  IMPLANT, BILATERAL Bilateral   . CHOLECYSTECTOMY  1993  . COLONOSCOPY N/A 02/13/2018   Procedure: COLONOSCOPY;  Surgeon: Ronnette Juniper, MD;  Location: [facility];  Service: Gastroenterology;  Laterality: N/A;  . ELECTROPHYSIOLOGIC STUDY N/A 08/19/2015   Procedure: Atrial Fibrillation Ablation;  Surgeon: Thompson Grayer, MD;  Location: [facility];  Service: Cardiovascular;  Laterality: N/A;  . ESOPHAGOGASTRODUODENOSCOPY N/A 02/13/2018   Procedure: ESOPHAGOGASTRODUODENOSCOPY (EGD);  Surgeon: Ronnette Juniper, MD;  Location: [facility];  Service: Gastroenterology;  Laterality: N/A;  . FOOT SURGERY    . IR REMOVAL TUN ACCESS W/ PORT W/O FL MOD SED  04/01/2017  . LUMBAR LAMINECTOMY    . MASS BIOPSY Left 12/07/2013   Procedure: EXCISIONAL BIOPSY LEFT NECK MASS ;  Surgeon: Jerrell Belfast, MD;  Location: [facility];  Service: ENT;  Laterality: Left;  Marland Kitchen MASTECTOMY Right 2017   malignant  . POLYPECTOMY  02/13/2018   Procedure: POLYPECTOMY;  Surgeon: Ronnette Juniper, MD;  Location: [facility];  Service: Gastroenterology;;  EGD  . PORTACATH PLACEMENT  12/2013   Archie Endo 01/04/2014  . PUBOVAGINAL SLING    . SKIN TAG REMOVAL  2012   leg  . TEE WITHOUT CARDIOVERSION N/A 08/19/2015   Procedure: TRANSESOPHAGEAL ECHOCARDIOGRAM (TEE);  Surgeon: Larey Dresser, MD;  Location: [facility];  Service: Cardiovascular;  Laterality: N/A;  . TONSILLECTOMY    . TOTAL MASTECTOMY Right 05/27/2015   Procedure: RIGHT TOTAL MASTECTOMY;  Surgeon: Fanny Skates, MD;   Location: [facility];  Service: General;  Laterality: Right;     Home Medications:  Prior to Admission medications   Medication Sig Start Date End Date Taking? Authorizing Provider  albuterol (PROVENTIL HFA;VENTOLIN HFA) 108 (90 Base) MCG/ACT inhaler Inhale 1-2 puffs into the lungs every 6 (six) hours as needed for wheezing or shortness of breath.    Yes [provider]  Alpha-Lipoic Acid 300 MG TABS Take 300 mg by mouth 2 (two) times daily.    Yes [provider]  anastrozole (ARIMIDEX) 1 MG tablet Take 1 tablet (1 mg total) by mouth daily. 09/18/18  Yes Heath Lark, MD  Cholecalciferol (VITAMIN D) 2000 UNITS tablet Take 2,000 Units by mouth daily with lunch.   Yes [provider]  Cyanocobalamin (B-12) 1000 MCG TABS Take 1,000 mcg by mouth daily with lunch.    Yes [provider]  diltiazem (CARDIZEM CD) 120 MG 24 hr capsule TAKE 1 CAPSULE BY MOUTH EVERY DAY Patient taking differently:  Take 120 mg by mouth daily.  02/14/19  Yes Adrian Prows, MD  Fluticasone-Salmeterol (ADVAIR) 250-50 MCG/DOSE AEPB Inhale 1 puff into the lungs every 12 (twelve) hours.    Yes [provider]  gabapentin (NEURONTIN) 600 MG tablet Take 600 mg by mouth 2 (two) times daily. 02/26/19  Yes [provider]  levothyroxine (SYNTHROID, LEVOTHROID) 200 MCG tablet Take 200 mcg by mouth daily before breakfast.   Yes [provider]  Magnesium Oxide 500 MG TABS Take 500 mg by mouth at bedtime.    Yes [provider]  metFORMIN (GLUCOPHAGE-XR) 500 MG 24 hr tablet Take 500 mg by mouth 2 (two) times daily with a meal.   Yes [provider]  metoprolol succinate (TOPROL-XL) 50 MG 24 hr tablet Take 25 mg by mouth every evening. Take with or immediately following a meal.   Yes [provider]  montelukast (SINGULAIR) 10 MG tablet Take 10 mg by mouth daily.   Yes [provider]  omega-3 acid ethyl esters (LOVAZA) 1 g capsule Take 1 g by mouth  2 (two) times daily.   Yes [provider]  Probiotic Product (PROBIOTIC PO) Take 1 tablet by mouth daily.   Yes [provider]  PROLIA 60 MG/ML SOSY injection Inject 60 mg into the vein every 6 (six) months. 07/18/17  Yes [provider]  traMADol-acetaminophen (ULTRACET) 37.5-325 MG tablet Take 1 tablet by mouth every 6 (six) hours as needed (pain).   Yes [provider]  triamterene-hydrochlorothiazide (MAXZIDE-25) 37.5-25 MG tablet Take 1 tablet by mouth daily as needed (for fluid retention/swelling.).  07/29/16  Yes [provider]  XARELTO 20 MG TABS tablet TAKE 1 TABLET (20 MG TOTAL) BY MOUTH DAILY WITH SUPPER. Patient taking differently: Take 20 mg by mouth daily with supper.  05/14/19  Yes Adrian Prows, MD  metoprolol succinate (TOPROL-XL) 25 MG 24 hr tablet Take 0.5 tablets (12.5 mg total) by mouth daily. Patient not taking: Reported on 05/19/2019 04/26/19 07/25/19  Heath Lark, MD    Inpatient Medications: Scheduled Meds: . anastrozole  1 mg Oral Daily  . chlorhexidine  60 mL Topical Once  . Chlorhexidine Gluconate Cloth  6 each Topical Daily  . cholecalciferol  2,000 Units Oral Q lunch  . gabapentin  600 mg Oral BID  . [START ON 05/21/2019] gentamicin irrigation  80 mg Irrigation On Call  . insulin aspart  0-6 Units Subcutaneous TID WC  . levothyroxine  200 mcg Oral Q0600  . magnesium oxide  400 mg Oral BID  . metFORMIN  500 mg Oral BID WC  . montelukast  10 mg Oral Daily  . omega-3 acid ethyl esters  1 g Oral BID  . sodium chloride flush  3 mL Intravenous Q12H  . vitamin B-12  2,500 mcg Oral Q lunch   Continuous Infusions: . [START ON 05/21/2019] sodium chloride    . [START ON 05/21/2019] sodium chloride    . DOPamine 5 mcg/kg/min (05/20/19 0800)  . [START ON 05/21/2019] vancomycin     PRN Meds: albuterol, [START ON 05/21/2019] sodium chloride flush, traMADol-acetaminophen, triamterene-hydrochlorothiazide  Allergies:    Allergies   Allergen Reactions  . Morphine And Related Anaphylaxis and Other (See Comments)    Pt states she stopped breathing post op- "went into resp. arrest"  . Multaq [Dronedarone] Other (See Comments)    Reaction:  Blood in urine and elevated liver enzymes.   . Demerol Nausea And Vomiting  . Flecainide Other (See Comments)  Mobitz II AV Block  . Oysters [Shellfish Allergy] Rash    & CLAMS  . Penicillins Itching and Rash    Has patient had a PCN reaction causing immediate rash, facial/tongue/throat swelling, SOB or lightheadedness with hypotension: No Has patient had a PCN reaction causing severe rash involving mucus membranes or skin necrosis: Yes Has patient had a PCN reaction that required hospitalization No Has patient had a PCN reaction occurring within the last 10 years: No If all of the above answers are "NO", then may proceed with Cephalosporin use.   [facility] Cross Reactors Rash    Social History:   Social History   Socioeconomic History  . Marital status: Married    Spouse name: Not on file  . Number of children: 2  . Years of education: Not on file  . Highest education level: Not on file  Occupational History  . Not on file  Tobacco Use  . Smoking status: Never Smoker  . Smokeless tobacco: Never Used  Substance and Sexual Activity  . Alcohol use: No    Alcohol/week: 0.0 standard drinks  . Drug use: No  . Sexual activity: Not Currently    Birth control/protection: Surgical  Other Topics Concern  . Not on file  Social History Narrative   Pt lives in [place] with spouse.   Retired Therapist, sports.  Previously worked in [employer].   Social Determinants of Health   Financial Resource Strain:   . Difficulty of Paying Living Expenses:   Food Insecurity:   . Worried About Charity fundraiser in the Last Year:   . Arboriculturist in the Last Year:   Transportation Needs:   . Film/video editor (Medical):   Marland Kitchen Lack of Transportation (Non-Medical):   Physical  Activity:   . Days of Exercise per Week:   . Minutes of Exercise per Session:   Stress:   . Feeling of Stress :   Social Connections:   . Frequency of Communication with Friends and Family:   . Frequency of Social Gatherings with Friends and Family:   . Attends Religious Services:   . Active Member of Clubs or Organizations:   . Attends Archivist Meetings:   Marland Kitchen Marital Status:   Intimate Partner Violence:   . Fear of Current or Ex-Partner:   . Emotionally Abused:   Marland Kitchen Physically Abused:   . Sexually Abused:     Family History:    Family History  Problem Relation Age of Onset  . Stroke Father 10  . Heart attack Father 74  . Hypertension Father   . Heart disease Mother   . Arthritis Mother   . Breast cancer Mother 61  . Heart failure Mother   . Heart attack Brother   . Hypertension Brother   . Diabetes Sister   . Cancer Sister 27     ROS:  Please see the history of present illness.   All other ROS reviewed and negative.     Physical Exam/Data:   Vitals:   05/20/19 1845 05/20/19 1900 05/20/19 2000 05/20/19 2010  BP:  124/63 (!) 86/50 (!) 127/59  Pulse: 82 83 82 82  Resp: 19 20 (!) 22 17  Temp:   98.1 F (36.7 C)   TempSrc:   Oral   SpO2: 95% 99% 98% 97%  Weight:      Height:        Intake/Output Summary (Last 24 hours) at 05/20/2019 2202 Last data filed  at 05/20/2019 2000 Gross per 24 hour  Intake 3302.83 ml  Output 1925 ml  Net 1377.83 ml   Last 3 Weights 05/19/2019 04/26/2019 04/24/2019  Weight (lbs) 140 lb 139 lb 11.2 oz 143 lb 12.8 oz  Weight (kg) 63.504 kg 63.368 kg 65.227 kg     Body mass index is 22.6 kg/m.  General:  Well nourished, well developed, in no acute distress HEENT: normal Lymph: no adenopathy Neck: no JVD Endocrine:  No thryomegaly Vascular: No carotid bruits; FA pulses 2+ bilaterally without bruits  Cardiac:  normal S1, S2; RRR; no murmur  Lungs:  clear to auscultation bilaterally, no wheezing, rhonchi or rales  Abd:  soft, nontender, no hepatomegaly  Ext: no edema Musculoskeletal:  No deformities, BUE and BLE strength normal and equal Skin: warm and dry  Neuro:  CNs 2-12 intact, no focal abnormalities noted Psych:  Normal affect   EKG:  The EKG was personally reviewed and demonstrates:  nsr Telemetry:  Telemetry was personally reviewed and demonstrates:  nsr  Relevant CV Studies: none  Laboratory Data:  High Sensitivity Troponin:   Recent Labs  Lab 05/19/19 2005 05/19/19 2201  TROPONINIHS 10 11     Chemistry Recent Labs  Lab 05/19/19 2005  NA 137  K 4.8  CL 108  CO2 18*  GLUCOSE 200*  BUN 17  CREATININE 1.29*  CALCIUM 8.3*  GFRNONAA 39*  GFRAA 45*  ANIONGAP 11    No results for input(s): PROT, ALBUMIN, AST, ALT, ALKPHOS, BILITOT in the last 168 hours. Hematology Recent Labs  Lab 05/19/19 2005 05/20/19 1004  WBC 12.2* 10.7*  RBC 3.32* 3.22*  HGB 10.8* 10.4*  HCT 33.7* 31.2*  MCV 101.5* 96.9  MCH 32.5 32.3  MCHC 32.0 33.3  RDW 13.7 13.7  PLT 182 177   BNPNo results for input(s): BNP, PROBNP in the last 168 hours.  DDimer No results for input(s): DDIMER in the last 168 hours.   Radiology/Studies:  No results found.       Assessment and Plan:   1. Sinus node dysfunction - she has improved with holding her meds. She will undergo PPM tomorrow.  2. PAF - she has maintained NSR. She is s/p ablation. If she were to develop more atrial fib, dofetilide would be a conisderation.  For questions or updates, please contact [place] Please consult [email or web address] for contact info under   Signed, Cristopher Peru, MD  05/20/2019 10:02 PM

## 2019-05-21 ENCOUNTER — Encounter (HOSPITAL_COMMUNITY)
Admission: EM | Disposition: A | Discharge status: Home / Self Care | Payer: Self-pay | Source: Home / Self Care | Attending: Cardiology

## 2019-05-21 ENCOUNTER — Encounter: Payer: Self-pay | Admitting: Cardiology

## 2019-05-21 ENCOUNTER — Inpatient Hospital Stay (HOSPITAL_COMMUNITY): Payer: Medicare Other

## 2019-05-21 DIAGNOSIS — Z45018 Encounter for adjustment and management of other part of cardiac pacemaker: Secondary | ICD-10-CM

## 2019-05-21 DIAGNOSIS — Z950 Presence of cardiac pacemaker: Secondary | ICD-10-CM

## 2019-05-21 HISTORY — DX: Encounter for adjustment and management of other part of cardiac pacemaker: Z45.018

## 2019-05-21 HISTORY — PX: PACEMAKER IMPLANT: EP1218

## 2019-05-21 HISTORY — DX: Presence of cardiac pacemaker: Z95.0

## 2019-05-21 LAB — COMPREHENSIVE METABOLIC PANEL
ALT: 29 U/L (ref 0–44)
AST: 27 U/L (ref 15–41)
Albumin: 3.5 g/dL (ref 3.5–5.0)
Alkaline Phosphatase: 90 U/L (ref 38–126)
Anion gap: 8 (ref 5–15)
BUN: 8 mg/dL (ref 8–23)
CO2: 24 mmol/L (ref 22–32)
Calcium: 8.5 mg/dL — ABNORMAL LOW (ref 8.9–10.3)
Chloride: 109 mmol/L (ref 98–111)
Creatinine, Ser: 0.89 mg/dL (ref 0.44–1.00)
GFR calc Af Amer: >60 mL/min (ref >60)
GFR calc non Af Amer: >60 mL/min (ref >60)
Glucose, Bld: 142 mg/dL — ABNORMAL HIGH (ref 70–99)
Potassium: 4.1 mmol/L (ref 3.5–5.1)
Sodium: 141 mmol/L (ref 135–145)
Total Bilirubin: 0.8 mg/dL (ref 0.3–1.2)
Total Protein: 5.3 g/dL — ABNORMAL LOW (ref 6.5–8.1)

## 2019-05-21 LAB — CBC
HCT: 31.9 % — ABNORMAL LOW (ref 36.0–46.0)
Hemoglobin: 10.5 g/dL — ABNORMAL LOW (ref 12.0–15.0)
MCH: 32 pg (ref 26.0–34.0)
MCHC: 32.9 g/dL (ref 30.0–36.0)
MCV: 97.3 fL (ref 80.0–100.0)
Platelets: 178 10*3/uL (ref 150–400)
RBC: 3.28 MIL/uL — ABNORMAL LOW (ref 3.87–5.11)
RDW: 13.7 % (ref 11.5–15.5)
WBC: 8.8 10*3/uL (ref 4.0–10.5)
nRBC: 0 % (ref 0.0–0.2)

## 2019-05-21 LAB — GLUCOSE, CAPILLARY
Glucose-Capillary: 133 mg/dL — ABNORMAL HIGH (ref 70–99)
Glucose-Capillary: 129 mg/dL — ABNORMAL HIGH (ref 70–99)
Glucose-Capillary: 143 mg/dL — ABNORMAL HIGH (ref 70–99)
Glucose-Capillary: 142 mg/dL — ABNORMAL HIGH (ref 70–99)
Glucose-Capillary: 172 mg/dL — ABNORMAL HIGH (ref 70–99)

## 2019-05-21 SURGERY — PACEMAKER IMPLANT

## 2019-05-21 MED ORDER — ACETAMINOPHEN 325 MG PO TABS
325.0000 mg | ORAL_TABLET | ORAL | Status: DC | PRN
  Administered 2019-05-22 – 2019-05-23 (×3): 650 mg via ORAL
  Filled 2019-05-21 (×3): qty 2

## 2019-05-21 MED ORDER — VANCOMYCIN HCL IN DEXTROSE 1-5 GM/200ML-% IV SOLN
INTRAVENOUS | Status: AC
  Filled 2019-05-21: qty 200

## 2019-05-21 MED ORDER — CHLORHEXIDINE GLUCONATE 4 % EX LIQD
CUTANEOUS | Status: AC
  Filled 2019-05-21: qty 15

## 2019-05-21 MED ORDER — LIDOCAINE HCL (PF) 1 % IJ SOLN
INTRAMUSCULAR | Status: DC | PRN
  Administered 2019-05-21: 60 mL

## 2019-05-21 MED ORDER — SODIUM CHLORIDE 0.9 % IV SOLN
INTRAVENOUS | Status: AC
  Filled 2019-05-21: qty 2

## 2019-05-21 MED ORDER — VANCOMYCIN HCL IN DEXTROSE 1-5 GM/200ML-% IV SOLN
1000.0000 mg | Freq: Two times a day (BID) | INTRAVENOUS | Status: AC
  Administered 2019-05-22: 1000 mg via INTRAVENOUS
  Filled 2019-05-21 (×2): qty 200

## 2019-05-21 MED ORDER — HEPARIN (PORCINE) IN NACL 1000-0.9 UT/500ML-% IV SOLN
INTRAVENOUS | Status: DC | PRN
  Administered 2019-05-21: 500 mL

## 2019-05-21 MED ORDER — ONDANSETRON HCL 4 MG/2ML IJ SOLN: 4.0000 mg | Freq: Four times a day (QID) | INTRAMUSCULAR | Status: DC | PRN

## 2019-05-21 MED ORDER — FENTANYL CITRATE (PF) 100 MCG/2ML IJ SOLN
INTRAMUSCULAR | Status: DC | PRN
  Administered 2019-05-21 (×2): 12.5 ug via INTRAVENOUS
  Administered 2019-05-21: 25 ug via INTRAVENOUS

## 2019-05-21 MED ORDER — METOPROLOL SUCCINATE ER 25 MG PO TB24
25.0000 mg | ORAL_TABLET | Freq: Every day | ORAL | Status: DC
  Administered 2019-05-21 – 2019-05-23 (×3): 25 mg via ORAL
  Filled 2019-05-21 (×3): qty 1

## 2019-05-21 MED ORDER — MIDAZOLAM HCL 5 MG/5ML IJ SOLN
INTRAMUSCULAR | Status: AC
  Filled 2019-05-21: qty 5

## 2019-05-21 MED ORDER — HEPARIN (PORCINE) IN NACL 1000-0.9 UT/500ML-% IV SOLN
INTRAVENOUS | Status: AC
  Filled 2019-05-21: qty 500

## 2019-05-21 MED ORDER — LIDOCAINE HCL 1 % IJ SOLN
INTRAMUSCULAR | Status: AC
  Filled 2019-05-21: qty 60

## 2019-05-21 MED ORDER — FENTANYL CITRATE (PF) 100 MCG/2ML IJ SOLN
INTRAMUSCULAR | Status: AC
  Filled 2019-05-21: qty 2

## 2019-05-21 MED ORDER — MIDAZOLAM HCL 5 MG/5ML IJ SOLN
INTRAMUSCULAR | Status: DC | PRN
  Administered 2019-05-21 (×3): 1 mg via INTRAVENOUS

## 2019-05-21 SURGICAL SUPPLY — 7 items
CABLE SURGICAL S-101-97-12 (CABLE) ×3 IMPLANT
LEAD TENDRIL MRI 52CM LPA1200M (Lead) ×2 IMPLANT
LEAD TENDRIL MRI 58CM LPA1200M (Lead) ×2 IMPLANT
PACEMAKER ASSURITY DR-RF (Pacemaker) ×2 IMPLANT
PAD PRO RADIOLUCENT 2001M-C (PAD) ×3 IMPLANT
SHEATH 8FR PRELUDE SNAP 13 (SHEATH) ×4 IMPLANT
TRAY PACEMAKER INSERTION (PACKS) ×3 IMPLANT

## 2019-05-21 NOTE — Progress Notes (Addendum)
Electrophysiology Rounding Note  Patient Name: Patty [name] Date of Encounter: 05/21/2019  Primary Cardiologist: Dr. Einar Gip Electrophysiologist: Dr. Rayann Heman (Seen for AF/AFL s/p ablation 04/2018)   Subjective   Feeling OK this am. Re-reviewed risks and benefits of PPM implantation. She verbalizes understanding and is agreeable to proceed. She has no further questions at this time.   Inpatient Medications    Scheduled Meds:  anastrozole  1 mg Oral Daily   chlorhexidine       Chlorhexidine Gluconate Cloth  6 each Topical Daily   cholecalciferol  2,000 Units Oral Q lunch   gabapentin  600 mg Oral BID   gentamicin irrigation  80 mg Irrigation On Call   insulin aspart  0-6 Units Subcutaneous TID WC   levothyroxine  200 mcg Oral Q0600   magnesium oxide  400 mg Oral BID   metFORMIN  500 mg Oral BID WC   montelukast  10 mg Oral Daily   omega-3 acid ethyl esters  1 g Oral BID   sodium chloride flush  3 mL Intravenous Q12H   vitamin B-12  2,500 mcg Oral Q lunch   Continuous Infusions:  sodium chloride     sodium chloride     DOPamine 5 mcg/kg/min (05/20/19 0800)   vancomycin     PRN Meds: albuterol, sodium chloride flush, traMADol-acetaminophen, triamterene-hydrochlorothiazide   Vital Signs    Vitals:   05/21/19 0400 05/21/19 0500 05/21/19 0600 05/21/19 0700  BP: 132/78 (!) 144/85 (!) 156/81 137/83  Pulse: 100 99 82 (!) 106  Resp: 17 14 (!) 21 16  Temp: 99.4 F (37.4 C)     TempSrc: Oral     SpO2: 93% 92% 96% 96%  Weight:      Height:        Intake/Output Summary (Last 24 hours) at 05/21/2019 0707 Last data filed at 05/21/2019 0700 Gross per 24 hour  Intake 2244.92 ml  Output 3225 ml  Net -980.08 ml   Filed Weights   05/19/19 1955 05/21/19 0339  Weight: 63.5 kg 69.7 kg    Physical Exam    GEN- The patient is well appearing, alert and oriented x 3 today.   Head- normocephalic, atraumatic Eyes-  Sclera clear, conjunctiva pink Ears- hearing  intact Oropharynx- clear Neck- supple Lungs- Clear to ausculation bilaterally, normal work of breathing Heart- Slightly tachy, no murmurs, rubs or gallops GI- soft, NT, ND, + BS Extremities- no clubbing, cyanosis, or edema Skin- no rash or lesion Psych- euthymic mood, full affect Neuro- strength and sensation are intact  Labs    CBC Recent Labs    05/20/19 1004 05/21/19 0021  WBC 10.7* 8.8  HGB 10.4* 10.5*  HCT 31.2* 31.9*  MCV 96.9 97.3  PLT 177 0000000   Basic Metabolic Panel Recent Labs    05/19/19 2005 05/21/19 0021  NA 137 141  K 4.8 4.1  CL 108 109  CO2 18* 24  GLUCOSE 200* 142*  BUN 17 8  CREATININE 1.29* 0.89  CALCIUM 8.3* 8.5*   Liver Function Tests Recent Labs    05/21/19 0021  AST 27  ALT 29  ALKPHOS 90  BILITOT 0.8  PROT 5.3*  ALBUMIN 3.5   No results for input(s): LIPASE, AMYLASE in the last 72 hours. Cardiac Enzymes No results for input(s): CKTOTAL, CKMB, CKMBINDEX, TROPONINI in the last 72 hours. BNP Invalid input(s): POCBNP D-Dimer No results for input(s): DDIMER in the last 72 hours. Hemoglobin A1C No results for input(s): HGBA1C in  the last 72 hours. Fasting Lipid Panel No results for input(s): CHOL, HDL, LDLCALC, TRIG, CHOLHDL, LDLDIRECT in the last 72 hours. Thyroid Function Tests No results for input(s): TSH, T4TOTAL, T3FREE, THYROIDAB in the last 72 hours.  Invalid input(s): FREET3  Telemetry    On arrival in ED had junctional escape in the 30s. Improved with holding AV nodal blockade and Dopamine.  NSR 80-90s overnight with frequent pauses.  This am seems to have stabilized in 90s (personally reviewed)  Radiology    No results found.   Patient Profile     Patty [name] is a 79 y.o. 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 She was admitted for symptomatic bradycardia/SSS.   Assessment & Plan    1. Tachy-brady  syndrome Pt admitted with bradycardia in the 30s on metoprolol and diltiazem due to her history of AF and recent exacerbation with RVR (03/2019) Remains on dopamine.   2. PAF/AFL s/p ablation 04/14/2018 Undrea Shipes need to be on rate control moving forward so Brandell Maready need to proceed with PPM placement.  Xarelto on hold for consideration of PPM. CHA2DS2VASC of at least 6    For questions or updates, please contact [place] Please consult [email or web address] for contact info under Cardiology/STEMI.  Signed, Shirley Friar, PA-C  05/21/2019, 7:07 AM   I have seen and examined this patient with Oda Kilts.  Agree with above, note added to reflect my findings.  On exam, tachycardic, regular, no murmurs. Patient with tachy/brady syndrome. Safari Cinque need pacemaker implant.    Kathie Posa M. Acy Orsak MD 05/21/2019 9:58 AM

## 2019-05-21 NOTE — Plan of Care (Signed)
Pt is alert and oriented, on room air, breathing is even and unlabored, lungs are clear and diminished throughout. Pt has been encouraged to cough, no secretions noted. Pt denies pain. Pt has been NSR throughout the night with 2nd degree block. Pt's blood pressure has been stable, no drips running. Pt has 2 peripheral Ivs in both wrists, flush well. Pt denies dizziness, shortness of breath, and lightheadedness. Pt is able to move all extremities but is weak. Pt has voided very well throughout the night. Pt has been prepped for procedure later on this morning.  Problem: Education: Goal: Knowledge of General Education information will improve Description: Including pain rating scale, medication(s)/side effects and non-pharmacologic comfort measures Outcome: Progressing   Problem: Clinical Measurements: Goal: Ability to maintain clinical measurements within normal limits will improve Outcome: Progressing Goal: Cardiovascular complication will be avoided Outcome: Progressing   Problem: Elimination: Goal: Will not experience complications related to urinary retention Outcome: Progressing

## 2019-05-21 NOTE — Plan of Care (Signed)
  Problem: Safety: Goal: Ability to remain free from injury will improve Outcome: Progressing   

## 2019-05-21 NOTE — H&P (Signed)
Patty [name] has presented today for surgery, with the diagnosis of tachy/brady syndrome.  The various methods of treatment have been discussed with the patient and family. After consideration of risks, benefits and other options for treatment, the patient has consented to  Procedure(s): Pacemaker implant as a surgical intervention .  Risks include but not limited to bleeding, tamponade, infection, pneumothorax, among others. The patient's history has been reviewed, patient examined, no change in status, stable for surgery.  I have reviewed the patient's chart and labs.  Questions were answered to the patient's satisfaction.    Alizee Maple Curt Bears, MD 05/21/2019 9:59 AM

## 2019-05-21 NOTE — Progress Notes (Signed)
Pt has requested 5 rings and 1 pair of earrings be locked up while she is in surgery. Her belongings placed in envelope and walked down to security.

## 2019-05-22 ENCOUNTER — Encounter (HOSPITAL_COMMUNITY): Payer: Self-pay | Admitting: Anesthesiology

## 2019-05-22 ENCOUNTER — Inpatient Hospital Stay (HOSPITAL_COMMUNITY): Payer: Medicare Other

## 2019-05-22 DIAGNOSIS — I484 Atypical atrial flutter: Secondary | ICD-10-CM

## 2019-05-22 LAB — GLUCOSE, CAPILLARY
Glucose-Capillary: 120 mg/dL — ABNORMAL HIGH (ref 70–99)
Glucose-Capillary: 132 mg/dL — ABNORMAL HIGH (ref 70–99)
Glucose-Capillary: 154 mg/dL — ABNORMAL HIGH (ref 70–99)
Glucose-Capillary: 149 mg/dL — ABNORMAL HIGH (ref 70–99)

## 2019-05-22 LAB — URINE CULTURE: Culture: >=100000 — AB

## 2019-05-22 MED ORDER — FLECAINIDE ACETATE 100 MG PO TABS
100.0000 mg | ORAL_TABLET | Freq: Two times a day (BID) | ORAL | Status: DC
  Administered 2019-05-22 – 2019-05-23 (×3): 100 mg via ORAL
  Filled 2019-05-22 (×4): qty 1

## 2019-05-22 MED ORDER — RIVAROXABAN 15 MG PO TABS
15.0000 mg | ORAL_TABLET | Freq: Every day | ORAL | Status: DC
  Administered 2019-05-22: 15 mg via ORAL
  Filled 2019-05-22: qty 1

## 2019-05-22 NOTE — Anesthesia Preprocedure Evaluation (Deleted)
Anesthesia Evaluation    Reviewed: Allergy & Precautions, Patient's Chart, lab work & pertinent test results, reviewed documented beta blocker date and time   History of Anesthesia Complications Negative for: history of anesthetic complications  Airway        Dental   Pulmonary asthma , sleep apnea ,           Cardiovascular hypertension, Pt. on medications and Pt. on home beta blockers +CHF  + dysrhythmias Atrial Fibrillation + pacemaker   TTE 04/05/19: moderate LVH, EF 56%, moderate  LAE, moderate AR, moderate TR, PASP 30 mmHg   Neuro/Psych negative neurological ROS  negative psych ROS   GI/Hepatic negative GI ROS, Neg liver ROS,   Endo/Other  diabetes, Type 2, Oral Hypoglycemic AgentsHypothyroidism   Renal/GU Renal InsufficiencyRenal disease  negative genitourinary   Musculoskeletal  (+) Arthritis ,   Abdominal   Peds  Hematology negative hematology ROS (+)   Anesthesia Other Findings Day of surgery medications reviewed with patient.  Reproductive/Obstetrics negative OB ROS                             Anesthesia Physical Anesthesia Plan  ASA: III  Anesthesia Plan: General   Post-op Pain Management:    Induction: Intravenous  PONV Risk Score and Plan: Treatment may vary due to age or medical condition and Propofol infusion  Airway Management Planned: Mask  Additional Equipment: None  Intra-op Plan:   Post-operative Plan:   Informed Consent:   Plan Discussed with:   Anesthesia Plan Comments:         Anesthesia Quick Evaluation

## 2019-05-22 NOTE — Progress Notes (Addendum)
Electrophysiology Rounding Note  Patient Name: Patty [name] Date of Encounter: 05/22/2019  Primary Cardiologist: Dr. Einar Gip Electrophysiologist: Dr. Rayann Heman (follows for AF) - Dr. Curt Bears placed device   Subjective   S/p St Jude Assurity dual chamber PPM with model numbers [id number] RA and RV leads  The patient is doing well today.  At this time, the patient denies chest pain, shortness of breath, or any new concerns.  Inpatient Medications    Scheduled Meds:  anastrozole  1 mg Oral Daily   Chlorhexidine Gluconate Cloth  6 each Topical Daily   cholecalciferol  2,000 Units Oral Q lunch   gabapentin  600 mg Oral BID   insulin aspart  0-6 Units Subcutaneous TID WC   levothyroxine  200 mcg Oral Q0600   magnesium oxide  400 mg Oral BID   metFORMIN  500 mg Oral BID WC   metoprolol succinate  25 mg Oral Daily   montelukast  10 mg Oral Daily   omega-3 acid ethyl esters  1 g Oral BID   sodium chloride flush  3 mL Intravenous Q12H   vitamin B-12  2,500 mcg Oral Q lunch   Continuous Infusions:  DOPamine 5 mcg/kg/min (05/20/19 0800)   PRN Meds: acetaminophen, albuterol, ondansetron (ZOFRAN) IV, traMADol-acetaminophen, triamterene-hydrochlorothiazide   Vital Signs    Vitals:   05/21/19 1819 05/21/19 1821 05/22/19 0022 05/22/19 0520  BP: 131/74 131/74 (!) 142/86 (!) 143/95  Pulse: (!) 101 (!) 101 97 100  Resp:    18  Temp:   98.6 F (37 C) 99.8 F (37.7 C)  TempSrc:   Oral Oral  SpO2:   100% 97%  Weight:      Height:        Intake/Output Summary (Last 24 hours) at 05/22/2019 0700 Last data filed at 05/22/2019 0616 Gross per 24 hour  Intake 860.94 ml  Output 3050 ml  Net -2189.06 ml   Filed Weights   05/19/19 1955 05/21/19 0339  Weight: 63.5 kg 69.7 kg    Physical Exam    GEN- The patient is well appearing, alert and oriented x 3 today.   Head- normocephalic, atraumatic Eyes-  Sclera clear, conjunctiva pink Ears- hearing intact Oropharynx- clear Neck-  supple Lungs- Clear to ausculation bilaterally, normal work of breathing Heart- Regular rate and rhythm, no murmurs, rubs or gallops. PPM site stable with steri-strips in place. No ecchymosis or hematoma at time of exam GI- soft, NT, ND, + BS Extremities- no clubbing, cyanosis, or edema Skin- no rash or lesion Psych- euthymic mood, full affect Neuro- strength and sensation are intact  Labs    CBC Recent Labs    05/20/19 1004 05/21/19 0021  WBC 10.7* 8.8  HGB 10.4* 10.5*  HCT 31.2* 31.9*  MCV 96.9 97.3  PLT 177 0000000   Basic Metabolic Panel Recent Labs    05/19/19 2005 05/21/19 0021  NA 137 141  K 4.8 4.1  CL 108 109  CO2 18* 24  GLUCOSE 200* 142*  BUN 17 8  CREATININE 1.29* 0.89  CALCIUM 8.3* 8.5*   Liver Function Tests Recent Labs    05/21/19 0021  AST 27  ALT 29  ALKPHOS 90  BILITOT 0.8  PROT 5.3*  ALBUMIN 3.5   No results for input(s): LIPASE, AMYLASE in the last 72 hours. Cardiac Enzymes No results for input(s): CKTOTAL, CKMB, CKMBINDEX, TROPONINI in the last 72 hours. BNP Invalid input(s): POCBNP D-Dimer No results for input(s): DDIMER in the last 72 hours.  Hemoglobin A1C No results for input(s): HGBA1C in the last 72 hours. Fasting Lipid Panel No results for input(s): CHOL, HDL, LDLCALC, TRIG, CHOLHDL, LDLDIRECT in the last 72 hours. Thyroid Function Tests No results for input(s): TSH, T4TOTAL, T3FREE, THYROIDAB in the last 72 hours.  Invalid input(s): FREET3  Telemetry    2:1 atrial flutter with rates 90-100s (personally reviewed)  Radiology    DG Chest 2 View  Result Date: 05/22/2019 CLINICAL DATA:  Pacemaker. EXAM: CHEST - 2 VIEW COMPARISON:  Chest x-ray 05/21/2019. FINDINGS: Cardiac pacer noted with lead tips over the right atrium right ventricle. Stable cardiomegaly, no pulmonary venous congestion persistent left base atelectasis/infiltrate small left pleural effusion. No pneumothorax. Degenerative change thoracic spine. IMPRESSION: 1.  Cardiac pacer with lead tips over the right atrium right ventricle. Stable cardiomegaly. No evidence of pneumothorax. 2. Persistent left base atelectasis/infiltrate small left pleural effusion. Electronically Signed   By: Marcello Moores  Register   On: 05/22/2019 06:19   DG Chest 2 View  Result Date: 05/21/2019 CLINICAL DATA:  Preop imaging.  Heart block. EXAM: CHEST - 2 VIEW COMPARISON:  03/23/2019 FINDINGS: Cardiomegaly. Retrocardiac opacity and mild interstitial coarsening with a few Kerley lines. There is bilateral fissure thickening. No visible pneumothorax. IMPRESSION: 1. Cardiomegaly and mild interstitial edema. 2. Small left pleural effusion with lower lobe opacity. Electronically Signed   By: Monte Fantasia M.D.   On: 05/21/2019 07:23   EP PPM/ICD IMPLANT  Result Date: 05/21/2019 SURGEON:  Allegra Lai, MD   PREPROCEDURE DIAGNOSIS:  Tachy/brady syndrome   POSTPROCEDURE DIAGNOSIS:  Tachy/brady syndrome    PROCEDURES:  1. Pacemaker implantation.    INTRODUCTION:  Patty [name] is a 79 y.o. female with a history of bradycardia who presents today for pacemaker implantation.  The patient reports intermittent episodes of dizziness over the past few months.  No reversible causes have been identified.  The patient therefore presents today for pacemaker implantation.   DESCRIPTION OF PROCEDURE:  Informed written consent was obtained, and  the patient was brought to the electrophysiology lab in a fasting state.  The patient required no sedation for the procedure today.  The patients left chest was prepped and draped in the usual sterile fashion by the EP lab staff. The skin overlying the left deltopectoral region was infiltrated with lidocaine for local analgesia.  A 4-cm incision was made over the left deltopectoral region.  A left subcutaneous pacemaker pocket was fashioned using a combination of sharp and blunt dissection. Electrocautery was required to assure hemostasis.  RA/RV Lead Placement: The left  axillary vein was therefore cannulated.  Through the left axillary vein, a [employer] model Tendril MRI [id number] (serial number  [id number]) right atrial lead and a St Jude Medical model Tendril MRI [id number] (serial number  [id number]) right ventricular lead were advanced with fluoroscopic visualization into the right atrial appendage and right ventricular apex positions respectively.  Initial atrial lead P- waves measured 3.3 mV with impedance of 604 ohms in atrial flutter.  Right ventricular lead R-waves measured 11.3 mV with an impedance of 689 ohms and a threshold of 1.1 V at 0.5 msec.  Both leads were secured to the pectoralis fascia using #2-0 silk over the suture sleeves. Device Placement:  The leads were then connected to a [place] MRI  model [id number] (serial number  [id number] ) pacemaker.  The pocket was irrigated with copious gentamicin solution.  The pacemaker was then placed into the pocket.  The pocket was then closed  in 3 layers with 2.0 Vicryl suture for the 3.0 Vicryl suture subcutaneous and subcuticular layers.  Steri-  Strips and a sterile dressing were then applied. EBL<52ml.  There were no early apparent complications.   CONCLUSIONS:  1. Successful implantation of a St Jude Medical Assurity MRI dual-chamber pacemaker for symptomatic bradycardia  2. No early apparent complications.       Allegra Lai, MD 05/21/2019 2:07 PM     Patient Profile     Patty [name] is a 79 y.o. 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 She was admitted for symptomatic bradycardia/SSS.   Assessment & Plan    1. 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   2. PAF/AFL s/p ablation 04/14/2018 Follows with Dr. Rayann Heman Can resume her rate control now in the setting of PPM. OK to resume Xarelto tonight per Dr. Curt Bears.  Usual follow  up and wound care/instructions reviewed with patient and placed in chart.  She is currently in a 2:1 atrial flutter. Alexes Menchaca check EKG to help clarify. She has previously had ablation of a cavo-tricuspid isthmus circuit so ? Left atrial flutter with multiple ablations for AF.   For questions or updates, please contact [place] Please consult [email or web address] for contact info under Cardiology/STEMI.  Signed, Shirley Friar, PA-C  05/22/2019, 7:00 AM   I have seen and examined this patient with Oda Kilts.  Agree with above, note added to reflect my findings.  On exam, RRR, no murmurs, lungs clear.  Is now status post Saint Jude dual-chamber pacemaker for tachybradycardia syndrome.  She unfortunately remains in atrial flutter as she did yesterday.  She is feeling much improved without symptoms of dizziness.  Would increase her rate control while she is in for follow-up in EP clinic with Dr. Rayann Heman for further discussions of care in 1 month as her anticoagulation has been held.  We Bacilio Abascal also arrange for follow-up in device clinic.  Tuvia Woodrick M. Zamarion Longest MD 05/22/2019 8:19 AM

## 2019-05-22 NOTE — Discharge Instructions (Signed)
After Your Pacemaker   . You have a St. Jude Pacemaker  ACTIVITY . Do not lift your arm above shoulder height for 1 week after your procedure. After 7 days, you may progress as below.     Tuesday May 29, 2019  Wednesday May 30, 2019 Thursday May 31, 2019 Friday June 01, 2019   . Do not lift, push, pull, or carry anything over 10 pounds with the affected arm until 6 weeks (Tuesday Jul 03, 2019 ) after your procedure.   . Do NOT DRIVE until you have been seen for your wound check, or as long as instructed by your healthcare provider.   . Ask your healthcare provider when you can go back to work   INCISION/Dressing . If you are on a blood thinner such as Coumadin, Xarelto, Eliquis, Plavix, or Pradaxa please confirm with your provider when this should be resumed.   . Monitor your Pacemaker site for redness, swelling, and drainage. Call the device clinic at [phone] if you experience these symptoms or fever/chills.  . If your incision is sealed with Steri-strips or staples, you may shower 10 days after your procedure or when told by your provider. Do not remove the steri-strips or let the shower hit directly on your site. You may wash around your site with soap and water.    Marland Kitchen Avoid lotions, ointments, or perfumes over your incision until it is well-healed.  . You may use a hot tub or a pool AFTER your wound check appointment if the incision is completely closed.  Marland Kitchen PAcemaker Alerts:  Some alerts are vibratory and others beep. These are NOT emergencies. Please call our office to let us know. If this occurs at night or on weekends, it can wait until the next business day. Send a remote transmission.  . If your device is capable of reading fluid status (for heart failure), you will be offered monthly monitoring to review this with you.   DEVICE MANAGEMENT . Remote monitoring is used to monitor your pacemaker from home. This monitoring is scheduled every 91 days by our office. It  allows [place] to keep an eye on the functioning of your device to ensure it is working properly. You will routinely see your Electrophysiologist annually (more often if necessary).   . You should receive your ID card for your new device in 4-8 weeks. Keep this card with you at all times once received. Consider wearing a medical alert bracelet or necklace.  . Your Pacemaker may be MRI compatible. This will be discussed at your next office visit/wound check.  You should avoid contact with strong electric or magnetic fields.    Do not use amateur (ham) radio equipment or electric (arc) welding torches. MP3 player headphones with magnets should not be used. Some devices are safe to use if held at least 12 inches (30 cm) from your Pacemaker. These include power tools, lawn mowers, and speakers. If you are unsure if something is safe to use, ask your health care provider.   When using your cell phone, hold it to the ear that is on the opposite side from the Pacemaker. Do not leave your cell phone in a pocket over the Pacemaker.   You may safely use electric blankets, heating pads, computers, and microwave ovens.  Call the office right away if:  You have chest pain.  You feel more short of breath than you have felt before.  You feel more light-headed than you have felt before.  Your incision starts to open up.  This information is not intended to replace advice given to you by your health care provider. Make sure you discuss any questions you have with your health care provider.

## 2019-05-22 NOTE — Discharge Summary (Addendum)
Physician Discharge Summary  Patient ID: Patty [name] MRN: [id number] DOB/AGE: August 01, 1940 79 y.o.  Admit date: 05/19/2019 Discharge date: 05/23/2019  Primary Discharge Diagnosis Cardiogenic shock due to complete heart block and sinus arrest needing inotropic support. Sinus node dysfunction Sick sinus syndrome- Tachy-Brady syndrome Paroxysmal atrial fibrillation-A. Flutter CHA2DS2-VASc Score is 6.  Yearly risk of stroke: >9% (A, F, HTN, DM, Ao Atherosclerosis).    Essential hypertension DM with stage 3 CKD.  Significant Diagnostic Studies:  EKG 05/19/2019 at 1952 hrs.: Sinus arrest with junctional escape rhythm at the rate of 37 bpm, inferior infarct old, anteroseptal infarct old.  Low-voltage complexes.  CXR 05/21/2019: 1. Cardiomegaly and mild interstitial edema. 2. Small left pleural effusion with lower lobe opacity. (This shadow was noted previously and also appeared to be present in 2017)  Permanent pacemaker implantation 05/21/2019: [place] MRI  model [id number] (serial number  [id number] ) pacemaker using  [employer] model Tendril MRI [id number] (serial number  [id number]) right atrial lead and a St Jude Medical model Tendril MRI [id number] (serial number  [id number]) right ventricular lead.  In person pacemaker check 05/23/19  Longevity 5.3-11.3 years.  Magnet rate 100 bpm.   Underlying: A sensed V sensed rhythm.  Mode DDI, base rate 60 bpm, maximum heart rate 110 bpm.  Paced AV delay 200 ms, sensed AV delay 200 ms. Changed and reprogrammed AV delay to 225 ms and changed DDI mode to DDD mode as patient is now in sinus rhythm with first-degree AV block.  Normal pacemaker function.  Prior studies:  Coronary angiogram 2010 (no stents-Dr. Peter Martinique)  Mild coronary calcification, Normal EF. Admitted to [place] [facility] in March 2012 for A. Fibrillation with RVR. Stress cardiolite 3/12 [facility]) no ischemia .   Echocardiogram 04/05/2019:  Left ventricle cavity is  normal in size. Moderate concentric hypertrophy of the left ventricle. Normal global wall motion. Normal LV systolic function with EF 56%. Unable to evaluate diastolic function due to atrial fibrillation.  Left atrial cavity is moderately dilated.  Trileaflet aortic valve.  Moderate (Grade II) aortic regurgitation.  Moderate (Grade II) mitral regurgitation.  Moderate tricuspid regurgitation. Estimated pulmonary artery systolic  pressure is 30 mmHg, increased from 22 mmHg  on previous study on 11/09/2017.   Hospital Course: Patient admitted with hypotension, shock related to complete heart block, sinus arrest.  Patient was admitted to the stepdown unit, was started on dopamine for improved heart rate and blood pressure, in view of difficulty in controlling her heart rate and tachybradycardia syndrome, EP consultation was obtained, she underwent permanent dual-chamber pacemaker implantation on 05/21/2019, however had sustained atrial flutter with RVR, hence after discussions with EP, she was started on flecainide on 05/22/2019, previously had not tolerated this due to high degree AV block.  There are plans for cardioversion the following day on 05/23/2019 however as patient converted spontaneously to sinus rhythm, pacemaker was interrogated at the bedside and parameters changed to DDD mode, voltage left intact, otherwise normal functioning pacemaker.  Recommendations on discharge: Patient was discharged on home medications except diltiazem was discontinued as she is now on metoprolol as previously she was on along with flecainide for atrial flutter/paroxysmal atrial fibrillation.  Upon discharge she had underlying need to rhythm, will need repeat EKG upon office visit to follow-up on QT interval.  Previously had tolerated 100 mg twice daily of flecainide but was discontinued due to prolonged AV block and high degree AV block however now that she has a pacemaker we  hope to achieve good heart rate control and  probably maintain sinus rhythm.  She will continue to follow-up with EP for rhythm management as necessary, will continue to monitor her pacemaker remotely in our clinic.  Patient was started back on Xarelto but reduced from 20 mg to 15 mg in view of underlying mild stage III chronic kidney disease.  Discharge Exam: Blood pressure 125/63, pulse 68, temperature 97.7 F (36.5 C), temperature source Oral, resp. rate 20, height 5\' 6"  (1.676 m), weight 69.2 kg, SpO2 99 %.  Physical Exam  Constitutional: She is oriented to person, place, and time. No distress.  Cardiovascular: Normal rate, regular rhythm, normal heart sounds and intact distal pulses. Exam reveals no gallop.  No murmur heard. No leg edema, no JVD.  Pulmonary/Chest: Effort normal and breath sounds normal.  Pacemaker/ICD site noted  in the left infraclavicular fossa.    Abdominal: Soft. Bowel sounds are normal.  Neurological: She is alert and oriented to person, place, and time.  Skin: Skin is warm and dry.  Labs:   Lab Results  Component Value Date   WBC 8.8 05/21/2019   HGB 10.5 (L) 05/21/2019   HCT 31.9 (L) 05/21/2019   MCV 97.3 05/21/2019   PLT 178 05/21/2019    Recent Labs  Lab 05/21/19 0021  NA 141  K 4.1  CL 109  CO2 24  BUN 8  CREATININE 0.89  CALCIUM 8.5*  PROT 5.3*  BILITOT 0.8  ALKPHOS 90  ALT 29  AST 27  GLUCOSE 142*   Lipid Panel     Component Value Date/Time   CHOL 123 03/16/2019 0932   TRIG 103 03/16/2019 0932   HDL 37 (L) 03/16/2019 0932   CHOLHDL 2.9 05/05/2010 0200   VLDL 33 05/05/2010 0200   LDLCALC 67 03/16/2019 0932   HEMOGLOBIN A1C Lab Results  Component Value Date   HGBA1C 5.8 (H) 03/16/2019   MPG 123 05/23/2015  TSH Recent Labs    03/16/19 0932 03/23/19 1839  TSH 2.860 6.601*    FOLLOW UP PLANS AND APPOINTMENTS   Allergies as of 05/23/2019       Reactions   Morphine And Related Anaphylaxis, Other (See Comments)   Pt states she stopped breathing post op- "went into  resp. arrest"   Multaq [dronedarone] Other (See Comments)   Reaction:  Blood in urine and elevated liver enzymes.    Demerol Nausea And Vomiting   Oysters [shellfish Allergy] Rash   & CLAMS   Penicillins Itching, Rash   Has patient had a PCN reaction causing immediate rash, facial/tongue/throat swelling, SOB or lightheadedness with hypotension: No Has patient had a PCN reaction causing severe rash involving mucus membranes or skin necrosis: Yes Has patient had a PCN reaction that required hospitalization No Has patient had a PCN reaction occurring within the last 10 years: No If all of the above answers are "NO", then may proceed with Cephalosporin use.   Sulfa Drugs Cross Reactors Rash        Medication List     STOP taking these medications    diltiazem 120 MG 24 hr capsule Commonly known as: CARDIZEM CD       TAKE these medications    albuterol 108 (90 Base) MCG/ACT inhaler Commonly known as: VENTOLIN HFA Inhale 1-2 puffs into the lungs every 6 (six) hours as needed for wheezing or shortness of breath.   Alpha-Lipoic Acid 300 MG Tabs Take 300 mg by mouth 2 (two) times daily.  anastrozole 1 MG tablet Commonly known as: ARIMIDEX Take 1 tablet (1 mg total) by mouth daily.   B-12 1000 MCG Tabs Take 1,000 mcg by mouth daily with lunch.   flecainide 100 MG tablet Commonly known as: TAMBOCOR Take 1 tablet (100 mg total) by mouth every 12 (twelve) hours.   Fluticasone-Salmeterol 250-50 MCG/DOSE Aepb Commonly known as: ADVAIR Inhale 1 puff into the lungs every 12 (twelve) hours.   gabapentin 600 MG tablet Commonly known as: NEURONTIN Take 600 mg by mouth 2 (two) times daily.   levothyroxine 200 MCG tablet Commonly known as: SYNTHROID Take 200 mcg by mouth daily before breakfast.   Magnesium Oxide 500 MG Tabs Take 500 mg by mouth at bedtime.   metFORMIN 500 MG 24 hr tablet Commonly known as: GLUCOPHAGE-XR Take 500 mg by mouth 2 (two) times daily with a  meal.   metoprolol succinate 50 MG 24 hr tablet Commonly known as: TOPROL-XL Take 25 mg by mouth every evening. Take with or immediately following a meal. What changed: Another medication with the same name was removed. Continue taking this medication, and follow the directions you see here.   montelukast 10 MG tablet Commonly known as: SINGULAIR Take 10 mg by mouth daily.   omega-3 acid ethyl esters 1 g capsule Commonly known as: LOVAZA Take 1 g by mouth 2 (two) times daily.   PROBIOTIC PO Take 1 tablet by mouth daily.   Prolia 60 MG/ML Sosy injection Generic drug: denosumab Inject 60 mg into the vein every 6 (six) months.   Rivaroxaban 15 MG Tabs tablet Commonly known as: XARELTO Take 1 tablet (15 mg total) by mouth daily with supper. What changed:  medication strength See the new instructions.   traMADol-acetaminophen 37.5-325 MG tablet Commonly known as: ULTRACET Take 1 tablet by mouth every 6 (six) hours as needed (pain).   triamterene-hydrochlorothiazide 37.5-25 MG tablet Commonly known as: MAXZIDE-25 Take 1 tablet by mouth daily as needed (for fluid retention/swelling.).   Vitamin D 50 MCG (2000 UT) tablet Take 2,000 Units by mouth daily with lunch.       Follow-up Information     Constance Haw, MD Follow up on 08/23/2019.   Specialty: Cardiology Why: at 230 pm for 3 month post pacemaker check Contact information: [address] [place] [address] [place] [address] [place] Follow up on 05/31/2019.   Why: at 330 pm for post pacemaker wound check Contact information: Start [phone]        Thompson Grayer, MD Follow up on 06/25/2019.   Specialty: Cardiology Why: at 3 pm for VIRTUAL VISIT Contact information: [place] [address] [phone]         Rex Kras, DO Follow up on  06/07/2019.   Specialties: Cardiology, Radiology, Vascular Surgery Why: 3:30 PM appointment. Bring all medications Contact information: [place] [address] [phone]           Adrian Prows, MD 05/23/2019, 9:18 PM  Pager: [phone] Office: [phone] If no answer: [phone]

## 2019-05-22 NOTE — Progress Notes (Signed)
During RN hand-off, pacemaker site assessed: covered with steri strips, no drainage observed, some tenderness at site.   Called PA [place] at [phone] and informed EKG complete.

## 2019-05-22 NOTE — Progress Notes (Signed)
[place] ambulated patient in hall with walker. Steady with walker, no distress, no complaints of SOB nor chest pain .

## 2019-05-22 NOTE — Progress Notes (Signed)
Subjective:  C/O Palpitations  Intake/Output from previous day:  I/O last 3 completed shifts: In: 1107.2 [P.O.:420; I.V.:486.2; IV Piggyback:200.9] Out: 5550 [Urine:5550] Total I/O In: 423 [P.O.:420; I.V.:3] Out: 650 [Urine:650]  Blood pressure (!) 146/95, pulse (!) 101, temperature 98.3 F (36.8 C), temperature source Oral, resp. rate 18, height '5\' 6"'  (1.676 m), weight 69.7 kg, SpO2 97 %. Physical Exam  Constitutional: She is oriented to person, place, and time. No distress.  Cardiovascular: Normal heart sounds and intact distal pulses. Tachycardia present. Exam reveals no gallop.  No murmur heard. No leg edema, no JVD.  Pulmonary/Chest: Effort normal and breath sounds normal.  Pacemaker/ICD site noted  in the left infraclavicular fossa.    Abdominal: Soft. Bowel sounds are normal.  Neurological: She is alert and oriented to person, place, and time.  Skin: Skin is warm and dry.   Lab Results: BMP BNP (last 3 results) No results for input(s): BNP in the last 8760 hours.  ProBNP (last 3 results) No results for input(s): PROBNP in the last 8760 hours. BMP Latest Ref Rng & Units 05/21/2019 05/19/2019 03/23/2019  Glucose 70 - 99 mg/dL 142(H) 200(H) 205(H)  BUN 8 - 23 mg/dL '8 17 23  ' Creatinine 0.44 - 1.00 mg/dL 0.89 1.29(H) 1.30(H)  BUN/Creat Ratio 12 - 28 - - -  Sodium 135 - 145 mmol/L 141 137 137  Potassium 3.5 - 5.1 mmol/L 4.1 4.8 4.4  Chloride 98 - 111 mmol/L 109 108 106  CO2 22 - 32 mmol/L 24 18(L) -  Calcium 8.9 - 10.3 mg/dL 8.5(L) 8.3(L) -   Hepatic Function Latest Ref Rng & Units 05/21/2019 03/23/2019 03/16/2019  Total Protein 6.5 - 8.1 g/dL 5.3(L) 5.4(L) 6.5  Albumin 3.5 - 5.0 g/dL 3.5 3.7 4.7  AST 15 - 41 U/L 27 32 37  ALT 0 - 44 U/L '29 26 27  ' Alk Phosphatase 38 - 126 U/L 90 91 112  Total Bilirubin 0.3 - 1.2 mg/dL 0.8 0.7 0.6  Bilirubin, Direct 0.0 - 0.2 mg/dL - - -   CBC Latest Ref Rng & Units 05/21/2019 05/20/2019 05/19/2019  WBC 4.0 - 10.5 K/uL 8.8 10.7(H) 12.2(H)   Hemoglobin 12.0 - 15.0 g/dL 10.5(L) 10.4(L) 10.8(L)  Hematocrit 36.0 - 46.0 % 31.9(L) 31.2(L) 33.7(L)  Platelets 150 - 400 K/uL 178 177 182   Lipid Panel     Component Value Date/Time   CHOL 123 03/16/2019 0932   TRIG 103 03/16/2019 0932   HDL 37 (L) 03/16/2019 0932   CHOLHDL 2.9 05/05/2010 0200   VLDL 33 05/05/2010 0200   LDLCALC 67 03/16/2019 0932   Cardiac Panel (last 3 results) No results for input(s): CKTOTAL, CKMB, TROPONINI, RELINDX in the last 72 hours.  HEMOGLOBIN A1C Lab Results  Component Value Date   HGBA1C 5.8 (H) 03/16/2019   MPG 123 05/23/2015   TSH Recent Labs    03/16/19 0932 03/23/19 1839  TSH 2.860 6.601*   Scheduled Meds: . anastrozole  1 mg Oral Daily  . Chlorhexidine Gluconate Cloth  6 each Topical Daily  . cholecalciferol  2,000 Units Oral Q lunch  . flecainide  100 mg Oral Q12H  . gabapentin  600 mg Oral BID  . insulin aspart  0-6 Units Subcutaneous TID WC  . levothyroxine  200 mcg Oral Q0600  . magnesium oxide  400 mg Oral BID  . metFORMIN  500 mg Oral BID WC  . metoprolol succinate  25 mg Oral Daily  . montelukast  10 mg Oral  Daily  . omega-3 acid ethyl esters  1 g Oral BID  . rivaroxaban  15 mg Oral Q supper  . sodium chloride flush  3 mL Intravenous Q12H  . vitamin B-12  2,500 mcg Oral Q lunch   Continuous Infusions: PRN Meds:.acetaminophen, albuterol, ondansetron (ZOFRAN) IV, traMADol-acetaminophen, triamterene-hydrochlorothiazide  Assessment/Plan:  1. Sick sinus syndrome/Tachycardia-Bradycardia syndrome S/P [employer] MRI model [id number] (serial number [id number] ) pacemaker. 2. Hypotensive shock and metabolic acidosis due to bradycardia. Now resolved. 3. PAFL/A. Fib now sustained A. Fl today. Yesterday was in and out.  CHA2DS2-VASc Score is 6.  Yearly risk of stroke: >9% (A, F, HTN, DM, Ao Atherosclerosis).    (Unable to tolerate Multaq due to abnorma LFT. Flecainide caused hight degree AV Block. Consider Tikosyn if  recurrent A. FIb James Allred) 5. Chronic stage 3 CKD stage a.   6. Hypertension  Rec: Patient very symptomatic with atrial fibrillation, atrial flutter.  I discussed with Dr. Curt Bears, previously patient had done well on flecainide but had developed high degree AV block but now that she has a pacemaker, will proceed with starting her on prior dose at 100 mg p.o. twice daily.  She has been on anticoagulation until pacemaker implantation hence less than 48 hours of patient being off of anticoagulation.  If she continues to remain in persistent atrial fibrillation/atrial flutter, then will consider cardioversion prior to her discharge tomorrow however if she has got paroxysmal atrial flutter, will discharge home on flecainide and follow-up in the outpatient basis.  Renal function is remained stable, will change her Xarelto from 20 mg to 50 mg in view of stage III chronic kidney disease.  Blood pressure is elevated, I will also restart diltiazem that he was previously on for hypertension.   Adrian Prows, M.D. 05/22/2019, 2:07 PM [facility] Pager: [phone] Office: [phone] If no answer: [phone]

## 2019-05-23 ENCOUNTER — Encounter (HOSPITAL_COMMUNITY)
Admission: EM | Disposition: A | Discharge status: Home / Self Care | Payer: Self-pay | Source: Home / Self Care | Attending: Cardiology

## 2019-05-23 ENCOUNTER — Other Ambulatory Visit: Payer: Self-pay

## 2019-05-23 ENCOUNTER — Telehealth: Payer: Self-pay

## 2019-05-23 ENCOUNTER — Encounter (HOSPITAL_COMMUNITY): Payer: Self-pay | Admitting: Cardiology

## 2019-05-23 LAB — GLUCOSE, CAPILLARY
Glucose-Capillary: 140 mg/dL — ABNORMAL HIGH (ref 70–99)
Glucose-Capillary: 229 mg/dL — ABNORMAL HIGH (ref 70–99)

## 2019-05-23 LAB — PROTIME-INR
INR: 2.1 — ABNORMAL HIGH (ref 0.8–1.2)
Prothrombin Time: 23.4 seconds — ABNORMAL HIGH (ref 11.4–15.2)

## 2019-05-23 SURGERY — CANCELLED PROCEDURE

## 2019-05-23 MED ORDER — RIVAROXABAN 15 MG PO TABS: 15.0000 mg | ORAL_TABLET | Freq: Every day | ORAL | 6 refills | Status: DC

## 2019-05-23 MED ORDER — FLECAINIDE ACETATE 100 MG PO TABS: 100.0000 mg | ORAL_TABLET | Freq: Two times a day (BID) | ORAL | 2 refills | Status: DC

## 2019-05-23 NOTE — Telephone Encounter (Signed)
-----   Message from Rolland Porter sent at 05/23/2019 10:35 AM EDT ----- Regarding: FW: Needs TOC. Discharged her today Appt scheduled 06/07/2019 @ 3:30 PM w/Dr Terri Skains. Please inform pt when TOC is done.  Thanks ----- Message ----- From: Adrian Prows, MD Sent: 05/23/2019  10:24 AM EDT To: [facility], # Subject: Needs TOC. Discharged her today                A. Fib and sick sinus syndrome. 1 week or 2 week which ever is possible. Can see anyone  JG

## 2019-05-23 NOTE — Telephone Encounter (Signed)
Location of hospitalization: [facility] Reason for hospitalization: BP was severely low 50/30, had to have pacemaker inserted. Psychologist, sport and exercise. Jude : Model #: [id number] Serial # : [id number]) Date of discharge: 05/23/2019 Date of first communication with patient: today Person contacting patient: Lorna Few Current symptoms: Patient stated that she feels pretty good. Do you understand why you were in the Hospital: Yes Questions regarding discharge instructions: None Where were you discharged to: Home Medications reviewed: Yes (New Meds - Flecainide 100mg , 1 tab every 12hrs,  Meds Changed - Metoprolol 25mg 1 tab daily Allergies reviewed: Yes Dietary changes reviewed: Yes. Discussed low fat and low salt diet.  Referals reviewed: NA Activities of Daily Living: Able to with mild limitations Any transportation issues/concerns: None Any patient concerns: None Confirmed importance & date/time of Follow up appt: Yes Confirmed with patient if condition begins to worsen call. Pt was given the office number and encouraged to call back with questions or concerns: Yes    Signed        ----- Message from Rolland Porter sent at 05/23/2019 10:35 AM EDT ----- Regarding: FW: Needs TOC. Discharged her today Appt scheduled 06/07/2019 @ 3:30 PM w/Dr Terri Skains. Please inform pt when TOC is done.

## 2019-05-23 NOTE — Progress Notes (Signed)
Patient discharged home writer found yellow valuable slip on chart stated she still had rings and earrings locked up,called patient she stated her husband picked these valuables up yesterday that she did not have any further valuables to pick up. Primary nurse informed of above.

## 2019-05-23 NOTE — Progress Notes (Signed)
Dr. Einar Gip confirmed that cardioversion will occur today.

## 2019-05-23 NOTE — Telephone Encounter (Signed)
-----   Message from Adrian Prows, MD sent at 05/22/2019  6:03 AM EDT ----- Regarding: Needs OV in 1 week and TOC to be done. Being discharged this morning Diagnosis Tachy-Brady Syndrome  Can you go into chart and place under f/u the date and time??  JG

## 2019-05-23 NOTE — Care Management Important Message (Signed)
Important Message  Patient Details  Name: Patty [name] MRN: [id number] Date of Birth: 11-Apr-1940   Medicare Important Message Given:  Yes     [name] 05/23/2019, 10:27 AM

## 2019-05-23 NOTE — Progress Notes (Signed)
Patient converted to Sinus Rhythm. Confirmed with EKG. On call Cardiologist paged.

## 2019-05-23 NOTE — Progress Notes (Signed)
Patient left unit for endoscopy for cardioversion at 0724  Report from endo at [facility]. Patient in NS rhythm; cardioversion cancelled. Patient returned to unit approx 0810.  Reviewed AVS with patient at 1250. All questions answered.   Patient escorted to main entrance for discharge via wheelchair at 1308; pacemaker monitor and all belongings discharged with patient.

## 2019-05-23 NOTE — Progress Notes (Signed)
Cardioversion canceled - pt is NSR.  [employer] rep confirmed SR.  Pt to return to floor.

## 2019-05-29 ENCOUNTER — Encounter: Payer: Self-pay | Admitting: Cardiology

## 2019-05-29 ENCOUNTER — Emergency Department (HOSPITAL_COMMUNITY)
Admission: EM | Admit: 2019-05-29 | Discharge: 2019-05-29 | Discharge status: Against Medical Advice | Payer: Medicare Other | Attending: Emergency Medicine | Admitting: Emergency Medicine

## 2019-05-29 ENCOUNTER — Telehealth: Payer: Self-pay

## 2019-05-29 ENCOUNTER — Other Ambulatory Visit: Payer: Self-pay

## 2019-05-29 ENCOUNTER — Encounter (HOSPITAL_COMMUNITY): Payer: Self-pay | Admitting: Emergency Medicine

## 2019-05-29 DIAGNOSIS — R531 Weakness: Secondary | ICD-10-CM | POA: Diagnosis not present

## 2019-05-29 DIAGNOSIS — Z5321 Procedure and treatment not carried out due to patient leaving prior to being seen by health care provider: Secondary | ICD-10-CM | POA: Diagnosis not present

## 2019-05-29 LAB — BASIC METABOLIC PANEL
Anion gap: 12 (ref 5–15)
BUN: 18 mg/dL (ref 8–23)
CO2: 24 mmol/L (ref 22–32)
Calcium: 10.4 mg/dL — ABNORMAL HIGH (ref 8.9–10.3)
Chloride: 102 mmol/L (ref 98–111)
Creatinine, Ser: 0.98 mg/dL (ref 0.44–1.00)
GFR calc Af Amer: >60 mL/min (ref >60)
GFR calc non Af Amer: 54 mL/min — ABNORMAL LOW (ref >60)
Glucose, Bld: 136 mg/dL — ABNORMAL HIGH (ref 70–99)
Potassium: 4.2 mmol/L (ref 3.5–5.1)
Sodium: 138 mmol/L (ref 135–145)

## 2019-05-29 LAB — CBC
HCT: 34.4 % — ABNORMAL LOW (ref 36.0–46.0)
Hemoglobin: 11.2 g/dL — ABNORMAL LOW (ref 12.0–15.0)
MCH: 32.3 pg (ref 26.0–34.0)
MCHC: 32.6 g/dL (ref 30.0–36.0)
MCV: 99.1 fL (ref 80.0–100.0)
Platelets: 239 10*3/uL (ref 150–400)
RBC: 3.47 MIL/uL — ABNORMAL LOW (ref 3.87–5.11)
RDW: 14.4 % (ref 11.5–15.5)
WBC: 9.4 10*3/uL (ref 4.0–10.5)
nRBC: 0 % (ref 0.0–0.2)

## 2019-05-29 NOTE — Telephone Encounter (Signed)
Dr. Curt Bears office called back stating being that pt bp readings were so low, he would rather pt go to ED to be evaluated. Pt aware, she called her son in law on the phone while I was holding. He will take her to the ED.//ah

## 2019-05-29 NOTE — ED Triage Notes (Signed)
Pt reports having pacemaker inserted this week. Endorses weakness but no falls. Denies pain or SOB

## 2019-05-29 NOTE — Telephone Encounter (Signed)
FYI: pt called in c/o fatigue, low bp readings 68/32, 88/44, 95/46, 85/42, HR running 60-74. Pacemaker implant 3/22 by Dr. Curt Bears. Wound check 4/1 and f/u with ST on 4/8. No dr's in the office this afternoon so office manager recommended to call Dr. Curt Bears office to advise. Spoke with him and he suggested pt ntbs. If she felt that she could wait until tomorrow, schedule with [place]. If not, go to ED today. Pt said that she can wait unitl tomorrow. Scheduled with ST.//ah

## 2019-05-30 ENCOUNTER — Ambulatory Visit: Payer: Medicare Other

## 2019-05-30 ENCOUNTER — Ambulatory Visit: Payer: Medicare Other | Admitting: Cardiology

## 2019-05-30 ENCOUNTER — Telehealth: Payer: Self-pay | Admitting: Cardiology

## 2019-05-30 ENCOUNTER — Encounter: Payer: Self-pay | Admitting: Cardiology

## 2019-05-30 ENCOUNTER — Telehealth: Payer: Self-pay

## 2019-05-30 VITALS — BP 110/58 | HR 61 | Temp 97.3°F | Ht 66.0 in | Wt 143.0 lb

## 2019-05-30 DIAGNOSIS — Z7901 Long term (current) use of anticoagulants: Secondary | ICD-10-CM | POA: Diagnosis not present

## 2019-05-30 DIAGNOSIS — E119 Type 2 diabetes mellitus without complications: Secondary | ICD-10-CM

## 2019-05-30 DIAGNOSIS — I959 Hypotension, unspecified: Secondary | ICD-10-CM

## 2019-05-30 DIAGNOSIS — I4892 Unspecified atrial flutter: Secondary | ICD-10-CM | POA: Diagnosis not present

## 2019-05-30 DIAGNOSIS — I10 Essential (primary) hypertension: Secondary | ICD-10-CM | POA: Diagnosis not present

## 2019-05-30 DIAGNOSIS — Z8679 Personal history of other diseases of the circulatory system: Secondary | ICD-10-CM

## 2019-05-30 DIAGNOSIS — Z950 Presence of cardiac pacemaker: Secondary | ICD-10-CM | POA: Diagnosis not present

## 2019-05-30 DIAGNOSIS — I480 Paroxysmal atrial fibrillation: Secondary | ICD-10-CM | POA: Diagnosis not present

## 2019-05-30 NOTE — Telephone Encounter (Signed)
Tammy from Dr. Irven Shelling office calling to speak with Sherri with Dr. Curt Bears office.

## 2019-05-30 NOTE — Telephone Encounter (Signed)
There are no alerts on her so far. I just wanted to make sure when she came in she was still in sinus on Flecainide and this was a TOC <7 day visit for billing. Her transmissions come to [place] as usual all our patient's do.  Patty [name]

## 2019-05-30 NOTE — Telephone Encounter (Signed)
Pt mention she went to the ER  And was there for about 4 hours and mention that she left due to not being seen. Pt mention she is feeling better and her b/p today was 116/60 pulse 60

## 2019-05-30 NOTE — Telephone Encounter (Signed)
[place]. Patient seen in the office today.

## 2019-05-30 NOTE — Telephone Encounter (Signed)
Device clinic notified me that pt's device transmissions are not being received by our office. Per our device clinic, transmissions are arranged to be sent to Dr. Irven Shelling office. Will forward this information to Dr. Terri Skains for his [facility]

## 2019-05-30 NOTE — Telephone Encounter (Signed)
Patient had hypotension and recent PPM implantation.  She was added on to today's schedule for follow up due to her symptoms.  Cecilia called [employer]Jude and tried to get hold off recent transmission report to make sure the device is functioning appropriately. We also called Dr. Macky Lower office to see if they had received any transmission regarding her episodes yesterday 05/29/19 around 11am.  I spoke to the [place] children who noted that the device is functioning normally.  She is AV paced (V>A).  Impedance and sensing reported to been in normal limits. She has an appointment with Dr.Camnitz's office tomorrow so if we have received any reports regarding the pacemaker please fax it to Dr. Macky Lower office for his review at the patient's appt tomorrow.   Thanks.

## 2019-05-30 NOTE — Progress Notes (Signed)
Patty [name] Date of Birth: 1941-02-02 MRN: [id number] Primary Care Provider:Pharr, Thayer Jew, MD  Date: 05/30/19  Chief Complaint  Patient presents with  . Fatigue  . Hypotension  . Follow-up    PM implant    HPI  Patty [name] is a 79 y.o.  female who presents to the office with a chief complaint of " fatigue and hypotension." Patient's past medical history and cardiovascular risk factors include: Sinus node dysfunction, sick sinus syndrome, paroxysmal atrial fibrillation/flutter, hypertension, diabetes mellitus type 2, chronic kidney disease stage III, on long-term oral anticoagulation, postmenopausal female, advanced age, status post permanent pacemaker implantation.  Patient's primary cardiologist is Dr. Adrian Prows and was last seen in the hospital on May 22, 2019.  She had called the office yesterday complaining of hypotension and feeling tired and fatigue.  She was added on today's schedule given her recent episode.  I am seeing her for the first time.   Sick sinus syndrome status post permanent pacemaker implantation: Patient was recently discharged on May 23, 2019 after undergoing successful implantation of dual-chamber [facility] Jude device by Dr. Curt Bears.  She was doing well; however, patient stated that yesterday at approximately 11 AM she started feeling more tired, fatigued, worn out.  She check her blood pressures and she had low blood pressure readings (95/44 with a pulse of 70, 68/40 with a pulse of 70) she was concerned of these readings given her symptoms.  Patient initially called our office and given the recent implantation of the permanent pacemaker she was scheduled to be seen at Dr. Curt Bears office.  She was informed by Dr. Haynes Dage office to go to the ER given her low blood pressure readings.  Patient states that she went to the ER yesterday and they did blood work, EKG, and she was waiting in the waiting room for approximately 3.5 hours.  She decided to go home without  any further assessment.  Patient states that when she went to the ER her blood pressure had come up to 120/70 and because there was 218 people in front of her she decides to go home and follow-up today at the office.  Currently patient denies any chest pain, shortness of breath at rest or with effort related activities.  No lightheadedness, dizziness, near syncope or syncopal events.  Patient does not endorse any evidence of bleeding.  Medications reconciled and she is not on Maxide on a regular basis.  Patient denies using her left arm above the shoulder level given the recent pacemaker implantation.  She denies any fevers or chills.   ALLERGIES: Allergies  Allergen Reactions  . Morphine And Related Anaphylaxis and Other (See Comments)    Pt states she stopped breathing post op- "went into resp. arrest"  . Multaq [Dronedarone] Other (See Comments)    Reaction:  Blood in urine and elevated liver enzymes.   . Demerol Nausea And Vomiting  . Oysters [Shellfish Allergy] Rash    & CLAMS  . Penicillins Itching and Rash    Has patient had a PCN reaction causing immediate rash, facial/tongue/throat swelling, SOB or lightheadedness with hypotension: No Has patient had a PCN reaction causing severe rash involving mucus membranes or skin necrosis: Yes Has patient had a PCN reaction that required hospitalization No Has patient had a PCN reaction occurring within the last 10 years: No If all of the above answers are "NO", then may proceed with Cephalosporin use.   [facility] Cross Reactors Rash  MEDICATION LIST PRIOR TO VISIT: Current Outpatient Medications on File Prior to Visit  Medication Sig Dispense Refill  . albuterol (PROVENTIL HFA;VENTOLIN HFA) 108 (90 Base) MCG/ACT inhaler Inhale 1-2 puffs into the lungs every 6 (six) hours as needed for wheezing or shortness of breath.     . Alpha-Lipoic Acid 300 MG TABS Take 300 mg by mouth 2 (two) times daily.     Marland Kitchen anastrozole (ARIMIDEX) 1 MG  tablet Take 1 tablet (1 mg total) by mouth daily. 90 tablet 3  . Cholecalciferol (VITAMIN D) 2000 UNITS tablet Take 2,000 Units by mouth daily with lunch.    . Cyanocobalamin (B-12) 1000 MCG TABS Take 1,000 mcg by mouth daily with lunch.     . flecainide (TAMBOCOR) 100 MG tablet Take 1 tablet (100 mg total) by mouth every 12 (twelve) hours. 60 tablet 2  . Fluticasone-Salmeterol (ADVAIR) 250-50 MCG/DOSE AEPB Inhale 1 puff into the lungs every 12 (twelve) hours.     . gabapentin (NEURONTIN) 600 MG tablet Take 600 mg by mouth 2 (two) times daily.    Marland Kitchen levothyroxine (SYNTHROID, LEVOTHROID) 200 MCG tablet Take 200 mcg by mouth daily before breakfast.    . Magnesium Oxide 500 MG TABS Take 500 mg by mouth at bedtime.     . metFORMIN (GLUCOPHAGE-XR) 500 MG 24 hr tablet Take 500 mg by mouth 2 (two) times daily with a meal.    . metoprolol succinate (TOPROL-XL) 50 MG 24 hr tablet Take 25 mg by mouth every evening. Take 1/2 tablet with or immediately following a meal.    . montelukast (SINGULAIR) 10 MG tablet Take 10 mg by mouth daily.    Marland Kitchen omega-3 acid ethyl esters (LOVAZA) 1 g capsule Take 1 g by mouth 2 (two) times daily.    . Probiotic Product (PROBIOTIC PO) Take 1 tablet by mouth daily.    Marland Kitchen PROLIA 60 MG/ML SOSY injection Inject 60 mg into the vein every 6 (six) months.    . Rivaroxaban (XARELTO) 15 MG TABS tablet Take 1 tablet (15 mg total) by mouth daily with supper. 30 tablet 6  . traMADol-acetaminophen (ULTRACET) 37.5-325 MG tablet Take 1 tablet by mouth every 6 (six) hours as needed (pain).    . triamterene-hydrochlorothiazide (MAXZIDE-25) 37.5-25 MG tablet Take 1 tablet by mouth daily as needed (for fluid retention/swelling.).      No current facility-administered medications on file prior to visit.    PAST MEDICAL HISTORY: Past Medical History:  Diagnosis Date  . Acute on chronic combined systolic and diastolic CHF (congestive heart failure) ([place])   . Arthritis   . Asthma   . Atrial  flutter (HCC)    PAROXYSMAL, EKG 10/06/2017, ATRIAL FLUTTER (ATYPICAL) WITH VARIABLE VENTRICULAR RESPONCE AND CONTROLLED  . Atrial flutter, paroxysmal ([place]) 06/02/2018  . 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 Bulging lumbar disc    3  . Chronic kidney disease    recurring UTI  . Complication of anesthesia    "morphine made me stop breathing"  . Concentric left ventricular hypertrophy    MILD DECREASE IN GLOBAL WALL MOTION. DOPPLER EVIDENCE OF GRADE II (PSEUDONORMAL) DIASTOLIC DYSFUNCTION, ELEVATED LAP, CALCULATED EF 45%, LEFT ATRIAL CAVITY IS MILDLY DILATED. MILD TO MODERATE AORTIC REGURGITATION. MILD (GRADE I) MITRAL REGURGITATION. MILD TRICUSPID REGURGITATION. ESTIMATED PULMONARY ARTERY SYSTOLIC PRESSURE 22 MMHG.  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 Encounter for care  of pacemaker 05/21/2019  . First degree AV block   . HCAP (healthcare-associated pneumonia) 12/2013   Archie Endo 01/18/2014  . History of cardioversion 06/02/2018  . History of skin cancer   . History of transfusion   . Hypertension   . Hypothyroidism   . Left atrial enlargement   . Leg swelling   . 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 Malaise and fatigue   . Nocturia   . Nonischemic cardiomyopathy ([place]) 06/02/2018  . Pacemaker [employer] MRI dual-chamber pacemaker 05/21/2019 05/21/2019  . Paroxysmal atrial fibrillation (HCC)   . Persistent atrial fibrillation ([place])   . Pure hypercholesterolemia   . Shortness of breath dyspnea    "at times"  . Skin cancer   . Sleep apnea    uses c-pap  . Type II diabetes mellitus ([place])     PAST SURGICAL HISTORY: Past Surgical History:  Procedure Laterality Date  . ABDOMINAL HYSTERECTOMY  1972   partial  . ABDOMINAL HYSTERECTOMY    . APPENDECTOMY  1953  . ATRIAL FIBRILLATION ABLATION  08/19/2015   PVI with CTI ablation by Dr Rayann Heman  . ATRIAL FIBRILLATION ABLATION N/A  04/14/2018   Procedure: ATRIAL FIBRILLATION ABLATION;  Surgeon: Thompson Grayer, MD;  Location: [place] [facility];  Service: Cardiovascular;  Laterality: N/A;  . BACK SURGERY     x2  . BIOPSY  02/13/2018   Procedure: BIOPSY;  Surgeon: Ronnette Juniper, MD;  Location: [facility];  Service: Gastroenterology;;  X2=EGD & Colon  . BREAST SURGERY     Right mastectomy  . CARDIAC CATHETERIZATION  10/10/2008   Nonobstructive CAD  . CARDIOVERSION  04/13/2011   Procedure: CARDIOVERSION;  Surgeon: Laverda Page, MD;  Location: [place];  Service: Cardiovascular;  Laterality: N/A;  . CARDIOVERSION N/A 06/18/2015   Procedure: CARDIOVERSION;  Surgeon: Adrian Prows, MD;  Location: [facility];  Service: Cardiovascular;  Laterality: N/A;  . CARDIOVERSION N/A 10/26/2016   Procedure: CARDIOVERSION;  Surgeon: Adrian Prows, MD;  Location: [place];  Service: Cardiovascular;  Laterality: N/A;  . CARDIOVERSION N/A 10/11/2017   Procedure: CARDIOVERSION;  Surgeon: Nigel Mormon, MD;  [facility];  Service: Cardiovascular;  Laterality: N/A;  . CARDIOVERSION N/A 05/23/2018   Procedure: CARDIOVERSION;  Surgeon: Adrian Prows, MD;  Location: [place];  Service: Cardiovascular;  Laterality: N/A;  . CATARACT EXTRACTION W/ INTRAOCULAR LENS  IMPLANT, BILATERAL Bilateral   . CHOLECYSTECTOMY  1993  . COLONOSCOPY N/A 02/13/2018   Procedure: COLONOSCOPY;  Surgeon: Ronnette Juniper, MD;  Location: [facility];  Service: Gastroenterology;  Laterality: N/A;  . ELECTROPHYSIOLOGIC STUDY N/A 08/19/2015   Procedure: Atrial Fibrillation Ablation;  Surgeon: Thompson Grayer, MD;  Location: [place] [facility];  Service: Cardiovascular;  Laterality: N/A;  . ESOPHAGOGASTRODUODENOSCOPY N/A 02/13/2018   Procedure: ESOPHAGOGASTRODUODENOSCOPY (EGD);  Surgeon: Ronnette Juniper, MD;  Location: [facility];  Service: Gastroenterology;  Laterality: N/A;  . FOOT SURGERY    . INSERT / REPLACE / REMOVE PACEMAKER     St Jude Medical Assurity MRI  dual-chamber pacemaker  . IR REMOVAL TUN ACCESS W/ PORT W/O FL MOD SED  04/01/2017  . LUMBAR LAMINECTOMY    . MASS BIOPSY Left 12/07/2013   Procedure: EXCISIONAL BIOPSY LEFT NECK MASS ;  Surgeon: Jerrell Belfast, MD;  Location: [place];  Service: ENT;  Laterality: Left;  Marland Kitchen MASTECTOMY Right 2017   malignant  . PACEMAKER IMPLANT N/A 05/21/2019   Procedure: PACEMAKER IMPLANT;  Surgeon: Constance Haw, MD;  Location: [place] [facility];  Service: Cardiovascular;  Laterality: N/A;  . POLYPECTOMY  02/13/2018   Procedure: POLYPECTOMY;  Surgeon: Ronnette Juniper, MD;  Location: [facility];  Service: Gastroenterology;;  EGD  . PORTACATH PLACEMENT  12/2013   Archie Endo 01/04/2014  . PUBOVAGINAL SLING    . SKIN TAG REMOVAL  2012   leg  . TEE WITHOUT CARDIOVERSION N/A 08/19/2015   Procedure: TRANSESOPHAGEAL ECHOCARDIOGRAM (TEE);  Surgeon: Larey Dresser, MD;  Location: [place];  Service: Cardiovascular;  Laterality: N/A;  . TONSILLECTOMY    . TOTAL MASTECTOMY Right 05/27/2015   Procedure: RIGHT TOTAL MASTECTOMY;  Surgeon: Fanny Skates, MD;  Location: [facility];  Service: General;  Laterality: Right;    FAMILY HISTORY: The patient's family history includes Arthritis in her mother; Breast cancer (age of onset: 32) in her mother; Cancer (age of onset: 23) in her sister; Diabetes in her sister; Heart attack in her brother; Heart attack (age of onset: 64) in her father; Heart disease in her mother; Heart failure in her mother; Hypertension in her brother and father; Stroke (age of onset: 13) in her father.   SOCIAL HISTORY:  The patient  reports that she has never smoked. She has never used smokeless tobacco. She reports that she does not drink alcohol or use drugs.  ROS  PHYSICAL EXAM: Vitals with BMI 05/30/2019 05/29/2019 05/23/2019  Height 5\' 6"  - -  Weight 143 lbs - -  BMI 123456 - -  Systolic [id number]  Diastolic 58 56 63  Pulse 61 51 68    CONSTITUTIONAL: Age-appropriate, frail elderly female,  no acute distress, hemodynamically stable.  SKIN: Skin is warm and dry. No rash noted. No cyanosis. No pallor. No jaundice HEAD: Normocephalic and atraumatic.  EYES: No scleral icterus MOUTH/THROAT: Moist oral membranes.  NECK: No JVD present. No thyromegaly noted. LYMPHATIC: No visible cervical adenopathy.  CHEST Normal respiratory effort. No intercostal retractions.  Pacemaker site at the left infraclavicular region is healing well.  Steri-Strips present. LUNGS: Clear to auscultation bilaterally.  No stridor. No wheezes. No rales.  CARDIOVASCULAR: Regular positive [id number], 3+ holosystolic murmur heard at the apex, no gallops or rubs appreciated. ABDOMINAL: Nonobese, soft, nontender nondistended, positive bowel sounds in all 4 quadrants, no apparent ascites.  EXTREMITIES: No peripheral edema.  Warm to touch bilaterally. HEMATOLOGIC: No significant bruising NEUROLOGIC: Oriented to person, place, and time. Nonfocal. Normal muscle tone.  PSYCHIATRIC: Normal mood and affect. Normal behavior. Cooperative  CARDIAC DATABASE: Pacemaker:  Successful implantation of a St Jude Medical Assurity MRI dual-chamber pacemaker for symptomatic bradycardia on 05/21/2019  EKG: 05/30/2019: Atrial sensed and ventricular paced rhythm, left axis deviation, poor R wave progression, inferior infarct age-indeterminate, without evidence of acute myocardial injury pattern.  Echocardiogram: April 05, 2019: LVEF 56%, moderate left ventricular hypertrophy, normal wall motion, moderately dilated left atrium, moderate MR, moderate TR, moderate AR.  Stress Testing:  None  Heart Catheterization: Coronary angiogram 2010 (no stents-Dr. Peter Martinique)  Mild coronary calcification, Normal EF. Admitted to [place] in March 2012 for A. Fibrillation with RVR. Stress cardiolite 3/12 [facility]) no ischemia .    LABORATORY DATA: CBC Latest Ref Rng & Units 05/29/2019 05/21/2019 05/20/2019  WBC 4.0 - 10.5 K/uL 9.4 8.8 10.7(H)    Hemoglobin 12.0 - 15.0 g/dL 11.2(L) 10.5(L) 10.4(L)  Hematocrit 36.0 - 46.0 % 34.4(L) 31.9(L) 31.2(L)  Platelets 150 - 400 K/uL 239 178 177    CMP Latest Ref Rng & Units 05/29/2019 05/21/2019 05/19/2019  Glucose 70 - 99 mg/dL 136(H) 142(H) 200(H)  BUN 8 - 23 mg/dL 18 8 17   Creatinine 0.44 - 1.00 mg/dL 0.98 0.89 1.29(H)  Sodium 135 - 145 mmol/L 138 141 137  Potassium 3.5 - 5.1 mmol/L 4.2 4.1 4.8  Chloride 98 - 111 mmol/L 102 109 108  CO2 22 - 32 mmol/L 24 24 18(L)  Calcium 8.9 - 10.3 mg/dL 10.4(H) 8.5(L) 8.3(L)  Total Protein 6.5 - 8.1 g/dL - 5.3(L) -  Total Bilirubin 0.3 - 1.2 mg/dL - 0.8 -  Alkaline Phos 38 - 126 U/L - 90 -  AST 15 - 41 U/L - 27 -  ALT 0 - 44 U/L - 29 -    Lipid Panel     Component Value Date/Time   CHOL 123 03/16/2019 0932   TRIG 103 03/16/2019 0932   HDL 37 (L) 03/16/2019 0932   CHOLHDL 2.9 05/05/2010 0200   VLDL 33 05/05/2010 0200   LDLCALC 67 03/16/2019 0932   LABVLDL 19 03/16/2019 0932    Lab Results  Component Value Date   HGBA1C 5.8 (H) 03/16/2019   HGBA1C 5.9 (H) 05/23/2015   HGBA1C 5.5 04/25/2015   No components found for: NTPROBNP Lab Results  Component Value Date   TSH 6.601 (H) 03/23/2019   TSH 2.860 03/16/2019   TSH 4.198 04/30/2015    Cardiac Panel (last 3 results) No results for input(s): CKTOTAL, CKMB, TROPONINIHS, RELINDX in the last 72 hours.  FINAL MEDICATION LIST END OF ENCOUNTER: No orders of the defined types were placed in this encounter.   There are no discontinued medications.   Current Outpatient Medications:  .  albuterol (PROVENTIL HFA;VENTOLIN HFA) 108 (90 Base) MCG/ACT inhaler, Inhale 1-2 puffs into the lungs every 6 (six) hours as needed for wheezing or shortness of breath. , Disp: , Rfl:  .  Alpha-Lipoic Acid 300 MG TABS, Take 300 mg by mouth 2 (two) times daily. , Disp: , Rfl:  .  anastrozole (ARIMIDEX) 1 MG tablet, Take 1 tablet (1 mg total) by mouth daily., Disp: 90 tablet, Rfl: 3 .  Cholecalciferol  (VITAMIN D) 2000 UNITS tablet, Take 2,000 Units by mouth daily with lunch., Disp: , Rfl:  .  Cyanocobalamin (B-12) 1000 MCG TABS, Take 1,000 mcg by mouth daily with lunch. , Disp: , Rfl:  .  flecainide (TAMBOCOR) 100 MG tablet, Take 1 tablet (100 mg total) by mouth every 12 (twelve) hours., Disp: 60 tablet, Rfl: 2 .  Fluticasone-Salmeterol (ADVAIR) 250-50 MCG/DOSE AEPB, Inhale 1 puff into the lungs every 12 (twelve) hours. , Disp: , Rfl:  .  gabapentin (NEURONTIN) 600 MG tablet, Take 600 mg by mouth 2 (two) times daily., Disp: , Rfl:  .  levothyroxine (SYNTHROID, LEVOTHROID) 200 MCG tablet, Take 200 mcg by mouth daily before breakfast., Disp: , Rfl:  .  Magnesium Oxide 500 MG TABS, Take 500 mg by mouth at bedtime. , Disp: , Rfl:  .  metFORMIN (GLUCOPHAGE-XR) 500 MG 24 hr tablet, Take 500 mg by mouth 2 (two) times daily with a meal., Disp: , Rfl:  .  metoprolol succinate (TOPROL-XL) 50 MG 24 hr tablet, Take 25 mg by mouth every evening. Take 1/2 tablet with or immediately following a meal., Disp: , Rfl:  .  montelukast (SINGULAIR) 10 MG tablet, Take 10 mg by mouth daily., Disp: , Rfl:  .  omega-3 acid ethyl esters (LOVAZA) 1 g capsule, Take 1 g by mouth 2 (two) times daily., Disp: , Rfl:  .  Probiotic Product (PROBIOTIC PO), Take 1 tablet by mouth daily., Disp: , Rfl:  .  PROLIA 60 MG/ML SOSY injection, Inject 60 mg into the vein every 6 (six) months., Disp: , Rfl:  .  Rivaroxaban (XARELTO) 15 MG TABS tablet, Take 1 tablet (15 mg total) by mouth daily with supper., Disp: 30 tablet, Rfl: 6 .  traMADol-acetaminophen (ULTRACET) 37.5-325 MG tablet, Take 1 tablet by mouth every 6 (six) hours as needed (pain)., Disp: , Rfl:  .  triamterene-hydrochlorothiazide (MAXZIDE-25) 37.5-25 MG tablet, Take 1 tablet by mouth daily as needed (for fluid retention/swelling.). , Disp: , Rfl:   IMPRESSION:    ICD-10-CM   1. Hypotension, unspecified hypotension type  I95.9   2. Paroxysmal atrial fibrillation (HCC)   I48.0 EKG 12-Lead  3. Atrial flutter, paroxysmal (HCC)  I48.92   4. Long term current use of anticoagulant therapy  Z79.01   5. Hx of sinus bradycardia  Z86.79   6. Pacemaker [place] MRI dual-chamber pacemaker 05/21/2019  Z95.0   7. Essential hypertension  I10   8. Controlled type 2 diabetes mellitus without complication, without long-term current use of insulin (HCC)  E11.9      RECOMMENDATIONS: BERNIECE REMO is a 79 y.o. female whose past medical history and cardiovascular risk factors include: Sinus node dysfunction, sick sinus syndrome, paroxysmal atrial fibrillation/flutter, hypertension, diabetes mellitus type 2, chronic kidney disease stage III, on long-term oral anticoagulation, postmenopausal female, advanced age, status post permanent pacemaker implantation.  Hypotension: Resolved.  Patient had an episode of hypotension yesterday for which she was symptomatic.  By the time she went to the ER her systolic blood pressures have improved.  Blood work, EKG that were performed in the ER were reviewed with the patient and her husband at today's visit.  EKG today shows a ventricular paced rhythm.  Patient is instructed not to take her Maxide for the time being until her symptoms have resolved.  For lower extremity swelling she could use compression stockings as needed.  Pacemaker in situ, [place] MRI dual chamber pacemaker:  Spoke to the [place] over the phone in regards to any transmissions since pacemaker implantation.  She informs me verbally that the device is functioning normally.  Patient is both atrial and ventricularly paced.  Manual thresholds were performed on May 23, 2019.  Impedance and sensing have been reported to be within normal limits.  She had one episode of a high atrial event on 05/23/2019 suggestive of atrial flutter.  No physical report available for me to review.  The pacemaker site is healing well no signs of  infection.  Patient provides reassurance that she has not moved her left upper extremity above her shoulder level.  She has an appointment with Dr. Curt Bears tomorrow.  History of symptomatic bradycardia: Status post dual-chamber pacemaker.  Paroxysmal atrial fibrillation/flutter: . Rate control: Metoprolol . Rhythm control: Flecainide . Thromboembolic prophylaxis: Xarelto . CHA2DS2-VASc SCORE is 6 which correlates to >9 % risk of stroke per year. . Patient does not endorse any evidence of bleeding.  Long-term oral anticoagulation: Currently on Xarelto for thromboembolic prophylaxis given her history of paroxysmal atrial fibrillation/flutter. She does not endorse any evidence of bleeding.  Diabetes mellitus type 2 with chronic kidney disease: Currently managed by primary team.  Orders Placed This Encounter  Procedures  . EKG 12-Lead   --Continue cardiac medications as reconciled in final medication list. --Return in about 2 weeks (around 06/13/2019) for Recent PPM  and hypotension. Or sooner if needed. --Continue follow-up with your primary care physician regarding the management of your other chronic comorbid conditions.  Patient's questions and concerns were addressed to her satisfaction. She voices understanding of the instructions provided during this encounter.   This note was created using a voice recognition software as a result there may be grammatical errors inadvertently enclosed that do not reflect the nature of this encounter. Every attempt is made to correct such errors.  Rex Kras, [place], [facility]  Pager: [phone] Office: [phone]

## 2019-05-30 NOTE — Telephone Encounter (Signed)
Nurse from Dr. Irven Shelling office, [place], called on behalf of pt/Dr. Terri Skains. Pt has returned to office today, still not feeling well, and doesn't look great per nurse. Pt SOB with continued low BPs (went to ED yesterday, per Dr. Curt Bears, for low BP).  Suggested they may want to obtain CXR for further evaluation.  (pt already scheduled for wound check tomorrow with our device clinic) Pt sent in manual transmission this morning.  Will forward to device clinic for review and to call pt with findings from transmission.  Dr. Terri Skains ([facility]'s office) would like transmission report to be faxed to their office for review.  Fax to [phone], Attn: Tammy.

## 2019-05-31 ENCOUNTER — Ambulatory Visit (INDEPENDENT_AMBULATORY_CARE_PROVIDER_SITE_OTHER): Payer: Medicare Other | Admitting: *Deleted

## 2019-05-31 ENCOUNTER — Other Ambulatory Visit: Payer: Self-pay

## 2019-05-31 DIAGNOSIS — I480 Paroxysmal atrial fibrillation: Secondary | ICD-10-CM | POA: Diagnosis not present

## 2019-05-31 DIAGNOSIS — I495 Sick sinus syndrome: Secondary | ICD-10-CM | POA: Diagnosis not present

## 2019-05-31 DIAGNOSIS — Z950 Presence of cardiac pacemaker: Secondary | ICD-10-CM

## 2019-05-31 LAB — CUP PACEART INCLINIC DEVICE CHECK
Battery Remaining Longevity: 56 mo
Battery Voltage: 3.04 V
Brady Statistic RA Percent Paced: 99 %
Brady Statistic RV Percent Paced: 99.96 %
Date Time Interrogation Session: 20210401180833
Implantable Lead Implant Date: 20210322
Implantable Lead Implant Date: 20210322
Implantable Lead Location: 753859
Implantable Lead Location: 753860
Implantable Pulse Generator Implant Date: 20210322
Lead Channel Impedance Value: 450 Ohm
Lead Channel Impedance Value: 537.5 Ohm
Lead Channel Pacing Threshold Amplitude: 0.75 V
Lead Channel Pacing Threshold Amplitude: 1 V
Lead Channel Pacing Threshold Pulse Width: 0.5 ms
Lead Channel Pacing Threshold Pulse Width: 0.5 ms
Lead Channel Sensing Intrinsic Amplitude: 1.7 mV
Lead Channel Sensing Intrinsic Amplitude: 12 mV
Lead Channel Setting Pacing Amplitude: 3.5 V
Lead Channel Setting Pacing Amplitude: 3.5 V
Lead Channel Setting Pacing Pulse Width: 0.5 ms
Lead Channel Setting Sensing Sensitivity: 0.5 mV
Pulse Gen Model: 2272
Pulse Gen Serial Number: 3808776

## 2019-05-31 NOTE — Progress Notes (Signed)
Wound check appointment. Steri-strips removed. Wound without redness or edema. Incision edges approximated, wound well healed. Normal device function. Thresholds, sensing, and impedances consistent with implant measurements. Device programmed at 3.5V for extra safety margin until 3 month visit. Histogram distribution blunted, sensor enabled with slope of 8; patient reports improvement in fatigue and shortness of breath with hall walk. No mode switches or high ventricular rates noted since 05/29/19 transmission. VP >99% with VIP enabled (extension up to 461ms), consider turning off VIP at next OV. Patient educated about wound care, arm mobility, and lifting restrictions. [place] transmissions followed by Dr. Einar Gip. ROV with Dr. Curt Bears on 08/23/19.  Patient reports that she has felt "terrible" since starting on flecainide. She also reports issues with hypotension. Encouraged patient to discuss concerns with Dr. Einar Gip. Advised this encounter note will be routed, as well. Patient has f/u with Dr. Terri Skains on 06/07/19.

## 2019-05-31 NOTE — Telephone Encounter (Signed)
Okay. Will have it changed to TOC.   Since she just had PPM placed and still had to see EP for wound check. Didn't know who get the transmission EP or [place]. I know after the wound check +/- one post-hospital visit with EP Ceclia takes over.   Will have her change to TOC.   ST

## 2019-06-05 ENCOUNTER — Other Ambulatory Visit: Payer: Self-pay | Admitting: Cardiology

## 2019-06-05 ENCOUNTER — Other Ambulatory Visit: Payer: Self-pay

## 2019-06-05 DIAGNOSIS — I484 Atypical atrial flutter: Secondary | ICD-10-CM

## 2019-06-06 ENCOUNTER — Ambulatory Visit (INDEPENDENT_AMBULATORY_CARE_PROVIDER_SITE_OTHER): Payer: Medicare Other | Admitting: Obstetrics & Gynecology

## 2019-06-06 ENCOUNTER — Encounter: Payer: Self-pay | Admitting: Obstetrics & Gynecology

## 2019-06-06 VITALS — BP 120/72 | Ht 64.0 in | Wt 145.0 lb

## 2019-06-06 DIAGNOSIS — M810 Age-related osteoporosis without current pathological fracture: Secondary | ICD-10-CM

## 2019-06-06 DIAGNOSIS — Z9289 Personal history of other medical treatment: Secondary | ICD-10-CM | POA: Diagnosis not present

## 2019-06-06 DIAGNOSIS — C50211 Malignant neoplasm of upper-inner quadrant of right female breast: Secondary | ICD-10-CM

## 2019-06-06 DIAGNOSIS — Z170 Estrogen receptor positive status [ER+]: Secondary | ICD-10-CM

## 2019-06-06 DIAGNOSIS — Z01419 Encounter for gynecological examination (general) (routine) without abnormal findings: Secondary | ICD-10-CM

## 2019-06-06 DIAGNOSIS — Z780 Asymptomatic menopausal state: Secondary | ICD-10-CM

## 2019-06-06 DIAGNOSIS — C8591 Non-Hodgkin lymphoma, unspecified, lymph nodes of head, face, and neck: Secondary | ICD-10-CM

## 2019-06-06 DIAGNOSIS — Z90710 Acquired absence of both cervix and uterus: Secondary | ICD-10-CM

## 2019-06-06 NOTE — Patient Instructions (Signed)
1. Well female exam with routine gynecological exam Gynecologic exam status post TAH.  No indication to repeat Pap tests at this point.  Status post right mastectomy, left breast normal.  Colonoscopy in 2019.  Health labs with family physician.  2. S/P TAH (total abdominal hysterectomy)  3. Postmenopausal  4. Age-related osteoporosis without current pathological fracture On Prolia x 07/2017.  BD Osteoporosis with improvement at hips and spine in 11/2018.  Continue Prolia, Vit D, Ca++ and walking.  5. Malignant neoplasm of upper-inner quadrant of right breast in female, estrogen receptor positive ([place]) Status post right mastectomy.  We will schedule a left screening mammogram.  On Arimidex.  6. 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  Aleksa, it was a pleasure seeing you today!

## 2019-06-06 NOTE — Progress Notes (Signed)
Patty [name] 1940/06/26 [id number]   History:    79 y.o. G2P2 Married  RP: Established patient presentingfor annual gyn exam  [id number].No hormone replacement therapy. Status post total abdominal hysterectomy with ovarian preservation. Abstinent. Malignant lymphoma 2015. Right breast cancer 2017 on Arimidex. Status post right mastectomy. Diabetes mellitus. Has neuropathies. Has to catheterize her bladder twice a day. On doxycycline to prevent bladder infections. 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 Feels tired. Enjoys reading. Body mass index 24.03.  Osteoporosis on BD 11/2018.  Prolia x 07/2017.   Past medical history,surgical history, family history and social history were all reviewed and documented in the EPIC chart.  Gynecologic History No LMP recorded. Patient has had a hysterectomy.  Obstetric History OB History  Gravida Para Term Preterm AB Living  2 2 2     2   SAB TAB Ectopic Multiple Live Births               # Outcome Date GA Lbr Len/2nd Weight Sex Delivery Anes PTL Lv  2 Term           1 Term              ROS: A ROS was performed and pertinent positives and negatives are included in the history.  GENERAL: No fevers or chills. HEENT: No change in vision, no earache, sore throat or sinus congestion. NECK: No pain or stiffness. CARDIOVASCULAR: No chest pain or pressure. No palpitations. PULMONARY: No shortness of breath, cough or wheeze. GASTROINTESTINAL: No abdominal pain, nausea, vomiting or diarrhea, melena or bright red blood per rectum. GENITOURINARY: No urinary frequency, urgency, hesitancy or dysuria. MUSCULOSKELETAL: No joint or muscle pain, no back pain, no recent trauma. DERMATOLOGIC: No rash, no itching, no lesions. ENDOCRINE: No polyuria, polydipsia, no heat or cold intolerance. No recent change in weight. HEMATOLOGICAL: No anemia or easy bruising or bleeding. NEUROLOGIC: No  headache, seizures, numbness, tingling or weakness. PSYCHIATRIC: No depression, no loss of interest in normal activity or change in sleep pattern.     Exam:   BP 120/72   Ht 5\' 4"  (1.626 m)   Wt 145 lb (65.8 kg)   BMI 24.89 kg/m   Body mass index is 24.89 kg/m.  General appearance : Well developed well nourished female. No acute distress HEENT: Eyes: no retinal hemorrhage or exudates,  Neck supple, trachea midline, no carotid bruits, no thyroidmegaly Lungs: Clear to auscultation, no rhonchi or wheezes, or rib retractions  Heart: Regular rate and rhythm, no murmurs or gallops Breast:Examined in sitting and supine position were symmetrical in appearance, no palpable masses or tenderness,  no skin retraction, no nipple inversion, no nipple discharge, no skin discoloration, no axillary or supraclavicular lymphadenopathy Abdomen: no palpable masses or tenderness, no rebound or guarding Extremities: no edema or skin discoloration or tenderness  Pelvic: Vulva: Normal             Vagina: No gross lesions or discharge  Cervix/Uterus absent  Adnexa  Without masses or tenderness  Anus: Normal   Assessment/Plan:  79 y.o. female for annual exam   1. Well female exam with routine gynecological exam Gynecologic exam status post TAH.  No indication to repeat Pap tests at this point.  Status post right mastectomy, left breast normal.  Colonoscopy in 2019.  Health labs with family physician.  2. S/P TAH (total abdominal hysterectomy)  3. Postmenopausal  4. Age-related osteoporosis without current pathological  fracture On Prolia x 07/2017.  BD Osteoporosis with improvement at hips and spine in 11/2018.  Continue Prolia, Vit D, Ca++ and walking.  5. Malignant neoplasm of upper-inner quadrant of right breast in female, estrogen receptor positive ([place]) Status post right mastectomy.  We will schedule a left screening mammogram.  On Arimidex.  6. 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  [name] MD, 3:05 PM 06/06/2019

## 2019-06-06 NOTE — Telephone Encounter (Signed)
Pacemaker alert from today. Unscheduled (Alert) 06/06/2019 A paced 73%, V paced 85%, 3523 AMS episodes.  43% mode switches.  AT AF burden 8%.  Latest episode was 06/06/2019 for 18 minutes, longest episode was on 06/05/2019 for 3 hours and 20 minutes.  Left message for the Patient, she was having some issues and difficulty with flecainide not feeling well and dosage was reduced from 100 to 50 mg twice daily, suspect recurrence of atrial flutter related to decreased flecainide.  We could certainly try 75 mg twice daily.  Left message for the patient.  Adrian Prows, MD, [facility] 06/06/2019, 3:48 PM [facility] [place] Office: [phone]

## 2019-06-07 ENCOUNTER — Ambulatory Visit: Payer: Medicare Other | Admitting: Cardiology

## 2019-06-07 ENCOUNTER — Encounter: Payer: Self-pay | Admitting: Cardiology

## 2019-06-07 ENCOUNTER — Other Ambulatory Visit: Payer: Self-pay

## 2019-06-07 VITALS — BP 127/75 | HR 99 | Temp 97.1°F | Ht 67.0 in | Wt 141.0 lb

## 2019-06-07 DIAGNOSIS — Z950 Presence of cardiac pacemaker: Secondary | ICD-10-CM

## 2019-06-07 DIAGNOSIS — I480 Paroxysmal atrial fibrillation: Secondary | ICD-10-CM | POA: Diagnosis not present

## 2019-06-07 DIAGNOSIS — E119 Type 2 diabetes mellitus without complications: Secondary | ICD-10-CM | POA: Diagnosis not present

## 2019-06-07 DIAGNOSIS — I495 Sick sinus syndrome: Secondary | ICD-10-CM

## 2019-06-07 DIAGNOSIS — I10 Essential (primary) hypertension: Secondary | ICD-10-CM

## 2019-06-07 DIAGNOSIS — Z7901 Long term (current) use of anticoagulants: Secondary | ICD-10-CM | POA: Diagnosis not present

## 2019-06-07 DIAGNOSIS — Z79899 Other long term (current) drug therapy: Secondary | ICD-10-CM

## 2019-06-07 DIAGNOSIS — I4892 Unspecified atrial flutter: Secondary | ICD-10-CM

## 2019-06-07 MED ORDER — FLECAINIDE ACETATE 100 MG PO TABS: 50.0000 mg | ORAL_TABLET | Freq: Two times a day (BID) | ORAL | 2 refills | Status: AC

## 2019-06-07 NOTE — Patient Instructions (Signed)
Please remember to bring in your medication bottles in at the next visit.   Medications that were changed at today's visit:  Flecainide 50 mg p.o. twice daily Hold off on a water pill due to low blood pressures in the recent past  Medications that were discontinued at today's visit: None  Compression stockings.  We will refill Xarelto based on kidney function.  Please get labs done  at the nearest [place].  Recommend follow up with your PCP as scheduled.

## 2019-06-07 NOTE — Progress Notes (Signed)
Patty [name] Date of Birth: 20-Nov-1940 MRN: [id number] Primary Care Provider:Pharr, Thayer Jew, MD  Date: 06/07/19  Last office visit: May 30, 2019   Chief Complaint  Patient presents with  . Follow-up    Recent episodes of hypotension  . Other    Pacemaker    HPI  Patty [name] is a 79 y.o.  female who presents to the office with a chief complaint of " follow-up regarding recent episodes of hypotension and pacemaker implantation." Patient's past medical history and cardiovascular risk factors include: Sinus node dysfunction, sick sinus syndrome, paroxysmal atrial fibrillation/flutter, hypertension, diabetes mellitus type 2, chronic kidney disease stage III, on long-term oral anticoagulation, postmenopausal female, advanced age, status post permanent pacemaker implantation.  Patient's primary cardiologist is Dr. Adrian Prows and I saw the patient on May 30, 2019 for working in visit given her episodes of hypotension.  Sick sinus syndrome status post permanent pacemaker implantation: Patient was recently discharged on May 23, 2019 after undergoing successful implantation of dual-chamber [facility] Jude device by Dr. Curt Bears.  Post pacemaker implantation patient did well overall; however, after discharge patient had episodes of hypotension for which she was symptomatic.  She did go to the ER after being instructed by her electrophysiologist but left after waiting in the waiting room for a prolonged period of time.  Patient was seen in the office on May 30, 2019 and her medications were titrated and was recommended not to be on antihypertensive medications/diuretics given the recent symptomatic hypotension.  Patient presents for follow-up today and states that she has been doing well since the last time she was seen in the office.  She informs me that she decided to stop taking flecainide on June 02, 2019 because she is contributing her symptoms to it.  Since stopping flecainide patient  states that she has been feeling well overall except for the last 2 days she did not feel at baseline.  Patient stated that she just felt tired more fatigue.  Patient had an unscheduled alert from her pacemaker device that was reported by Dr. Adrian Prows yesterday.  Please refer to the telephone encounter.  These findings were conveyed to the patient and her husband today.  ALLERGIES: Allergies  Allergen Reactions  . Morphine And Related Anaphylaxis and Other (See Comments)    Pt states she stopped breathing post op- "went into resp. arrest"  . Multaq [Dronedarone] Other (See Comments)    Reaction:  Blood in urine and elevated liver enzymes.   . Demerol Nausea And Vomiting  . Oysters [Shellfish Allergy] Rash    & CLAMS  . Penicillins Itching and Rash    Has patient had a PCN reaction causing immediate rash, facial/tongue/throat swelling, SOB or lightheadedness with hypotension: No Has patient had a PCN reaction causing severe rash involving mucus membranes or skin necrosis: Yes Has patient had a PCN reaction that required hospitalization No Has patient had a PCN reaction occurring within the last 10 years: No If all of the above answers are "NO", then may proceed with Cephalosporin use.   Patty [name] [facility] Reactors Rash     MEDICATION LIST PRIOR TO VISIT: Current Outpatient Medications on File Prior to Visit  Medication Sig Dispense Refill  . albuterol (PROVENTIL HFA;VENTOLIN HFA) 108 (90 Base) MCG/ACT inhaler Inhale 1-2 puffs into the lungs every 6 (six) hours as needed for wheezing or shortness of breath.     . Alpha-Lipoic Acid 300 MG TABS Take 300 mg by mouth 2 (two)  times daily.     Marland Kitchen anastrozole (ARIMIDEX) 1 MG tablet Take 1 tablet (1 mg total) by mouth daily. 90 tablet 3  . Cholecalciferol (VITAMIN D) 2000 UNITS tablet Take 2,000 Units by mouth daily with lunch.    . Cyanocobalamin (B-12) 1000 MCG TABS Take 1,000 mcg by mouth daily with lunch.     . Fluticasone-Salmeterol  (ADVAIR) 250-50 MCG/DOSE AEPB Inhale 1 puff into the lungs every 12 (twelve) hours.     . gabapentin (NEURONTIN) 600 MG tablet Take 600 mg by mouth 2 (two) times daily.    Marland Kitchen levothyroxine (SYNTHROID, LEVOTHROID) 200 MCG tablet Take 200 mcg by mouth daily before breakfast.    . Magnesium Oxide 500 MG TABS Take 500 mg by mouth at bedtime.     . metFORMIN (GLUCOPHAGE-XR) 500 MG 24 hr tablet Take 500 mg by mouth 2 (two) times daily with a meal.    . metoprolol succinate (TOPROL-XL) 50 MG 24 hr tablet TAKE 1 TABLET (50 MG TOTAL) BY MOUTH DAILY. TAKE WITH OR IMMEDIATELY FOLLOWING A MEAL. (Patient taking differently: Take 25 mg by mouth daily. ) 90 tablet 0  . montelukast (SINGULAIR) 10 MG tablet Take 10 mg by mouth daily.    Marland Kitchen omega-3 acid ethyl esters (LOVAZA) 1 g capsule Take 1 g by mouth 2 (two) times daily.    . Probiotic Product (PROBIOTIC PO) Take 1 tablet by mouth daily.    Marland Kitchen PROLIA 60 MG/ML SOSY injection Inject 60 mg into the vein every 6 (six) months.    . Rivaroxaban (XARELTO) 15 MG TABS tablet Take 1 tablet (15 mg total) by mouth daily with supper. 30 tablet 6  . traMADol-acetaminophen (ULTRACET) 37.5-325 MG tablet Take 1 tablet by mouth every 6 (six) hours as needed (pain).    . triamterene-hydrochlorothiazide (MAXZIDE-25) 37.5-25 MG tablet Take 1 tablet by mouth daily as needed (for fluid retention/swelling.).      No current facility-administered medications on file prior to visit.    PAST MEDICAL HISTORY: Past Medical History:  Diagnosis Date  . Acute on chronic combined systolic and diastolic CHF (congestive heart failure) ([place])   . Arthritis   . Asthma   . Atrial flutter (HCC)    PAROXYSMAL, EKG 10/06/2017, ATRIAL FLUTTER (ATYPICAL) WITH VARIABLE VENTRICULAR RESPONCE AND CONTROLLED  . Atrial flutter, paroxysmal ([place]) 06/02/2018  . Breast cancer, right ([place]) 2/17   R sided, s/p total mastectomy  . Bulging lumbar disc    3  . Chronic kidney disease    recurring UTI  .  Complication of anesthesia    "morphine made me stop breathing"  . Concentric left ventricular hypertrophy    MILD DECREASE IN GLOBAL WALL MOTION. DOPPLER EVIDENCE OF GRADE II (PSEUDONORMAL) DIASTOLIC DYSFUNCTION, ELEVATED LAP, CALCULATED EF 45%, LEFT ATRIAL CAVITY IS MILDLY DILATED. MILD TO MODERATE AORTIC REGURGITATION. MILD (GRADE I) MITRAL REGURGITATION. MILD TRICUSPID REGURGITATION. ESTIMATED PULMONARY ARTERY SYSTOLIC PRESSURE 22 MMHG.  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 Encounter for care of pacemaker 05/21/2019  . First degree AV block   . HCAP (healthcare-associated pneumonia) 12/2013   Archie Endo 01/18/2014  . History of cardioversion 06/02/2018  . History of skin cancer   . History of transfusion   . Hypertension   . Hypothyroidism   . Left atrial enlargement   . Leg swelling   . 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 Malaise and fatigue   .  Nocturia   . Nonischemic cardiomyopathy ([place]) 06/02/2018  . Pacemaker [place] MRI dual-chamber pacemaker 05/21/2019 05/21/2019  . Paroxysmal atrial fibrillation (HCC)   . Persistent atrial fibrillation ([place])   . Pure hypercholesterolemia   . Shortness of breath dyspnea    "at times"  . Skin cancer   . Sleep apnea    uses c-pap  . Type II diabetes mellitus ([place])     PAST SURGICAL HISTORY: Past Surgical History:  Procedure Laterality Date  . ABDOMINAL HYSTERECTOMY  1972   partial  . ABDOMINAL HYSTERECTOMY    . APPENDECTOMY  1953  . ATRIAL FIBRILLATION ABLATION  08/19/2015   PVI with CTI ablation by Dr Rayann Heman  . ATRIAL FIBRILLATION ABLATION N/A 04/14/2018   Procedure: ATRIAL FIBRILLATION ABLATION;  Surgeon: Thompson Grayer, MD;  Location: [place] [facility];  Service: Cardiovascular;  Laterality: N/A;  . BACK SURGERY     x2  . BIOPSY  02/13/2018   Procedure: BIOPSY;  Surgeon: Ronnette Juniper, MD;  Location: [facility];  Service: Gastroenterology;;  X2=EGD  & Colon  . BREAST SURGERY     Right mastectomy  . CARDIAC CATHETERIZATION  10/10/2008   Nonobstructive CAD  . CARDIOVERSION  04/13/2011   Procedure: CARDIOVERSION;  Surgeon: Laverda Page, MD;  Location: [place];  Service: Cardiovascular;  Laterality: N/A;  . CARDIOVERSION N/A 06/18/2015   Procedure: CARDIOVERSION;  Surgeon: Adrian Prows, MD;  [facility];  Service: Cardiovascular;  Laterality: N/A;  . CARDIOVERSION N/A 10/26/2016   Procedure: CARDIOVERSION;  Surgeon: Adrian Prows, MD;  Location: [place];  Service: Cardiovascular;  Laterality: N/A;  . CARDIOVERSION N/A 10/11/2017   Procedure: CARDIOVERSION;  Surgeon: Nigel Mormon, MD;  Location: [facility] ENDOSCOPY;  Service: Cardiovascular;  Laterality: N/A;  . CARDIOVERSION N/A 05/23/2018   Procedure: CARDIOVERSION;  Surgeon: Adrian Prows, MD;  Location: [place];  Service: Cardiovascular;  Laterality: N/A;  . CATARACT EXTRACTION W/ INTRAOCULAR LENS  IMPLANT, BILATERAL Bilateral   . CHOLECYSTECTOMY  1993  . COLONOSCOPY N/A 02/13/2018   Procedure: COLONOSCOPY;  Surgeon: Ronnette Juniper, MD;  Location: [facility]COPY;  Service: Gastroenterology;  Laterality: N/A;  . ELECTROPHYSIOLOGIC STUDY N/A 08/19/2015   Procedure: Atrial Fibrillation Ablation;  Surgeon: Thompson Grayer, MD;  Location: [place] [facility];  Service: Cardiovascular;  Laterality: N/A;  . ESOPHAGOGASTRODUODENOSCOPY N/A 02/13/2018   Procedure: ESOPHAGOGASTRODUODENOSCOPY (EGD);  Surgeon: Ronnette Juniper, MD;  Location: [facility];  Service: Gastroenterology;  Laterality: N/A;  . FOOT SURGERY    . INSERT / REPLACE / REMOVE PACEMAKER     [employer] Jude [employer]urity MRI dual-chamber pacemaker  . IR REMOVAL TUN ACCESS W/ PORT W/O FL MOD SED  04/01/2017  . LUMBAR LAMINECTOMY    . MASS BIOPSY Left 12/07/2013   Procedure: EXCISIONAL BIOPSY LEFT NECK MASS ;  Surgeon: Jerrell Belfast, MD;  Location: [place];  Service: ENT;  Laterality: Left;  Marland Kitchen MASTECTOMY Right 2017   malignant  .  PACEMAKER IMPLANT N/A 05/21/2019   Procedure: PACEMAKER IMPLANT;  Surgeon: Constance Haw, MD;  Location: [place] [facility];  Service: Cardiovascular;  Laterality: N/A;  . POLYPECTOMY  02/13/2018   Procedure: POLYPECTOMY;  Surgeon: Ronnette Juniper, MD;  Location: [facility];  Service: Gastroenterology;;  EGD  . PORTACATH PLACEMENT  12/2013   Archie Endo 01/04/2014  . PUBOVAGINAL SLING    . SKIN TAG REMOVAL  2012   leg  . TEE WITHOUT CARDIOVERSION N/A 08/19/2015   Procedure: TRANSESOPHAGEAL ECHOCARDIOGRAM (TEE);  Surgeon:  Larey Dresser, MD;  Location: [facility] ENDOSCOPY;  Service: Cardiovascular;  Laterality: N/A;  . TONSILLECTOMY    . TOTAL MASTECTOMY Right 05/27/2015   Procedure: RIGHT TOTAL MASTECTOMY;  Surgeon: Fanny Skates, MD;  Location: [facility];  Service: General;  Laterality: Right;    FAMILY HISTORY: The patient's family history includes Arthritis in her mother; Breast cancer (age of onset: 68) in her mother; Cancer (age of onset: 27) in her sister; Diabetes in her sister; Heart attack in her brother; Heart attack (age of onset: 51) in her father; Heart disease in her mother; Heart failure in her mother; Hypertension in her brother and father; Stroke (age of onset: 106) in her father.   SOCIAL HISTORY:  The patient  reports that she has never smoked. She has never used smokeless tobacco. She reports that she does not drink alcohol or use drugs.  Review of Systems  Constitution: Positive for malaise/fatigue (improving compared to yesterday). Negative for chills and fever.  HENT: Negative for ear discharge, ear pain and nosebleeds.   Eyes: Negative for blurred vision and discharge.  Cardiovascular: Negative for chest pain, claudication, dyspnea on exertion, leg swelling, near-syncope, orthopnea, palpitations, paroxysmal nocturnal dyspnea and syncope.  Respiratory: Negative for cough and shortness of breath.   Endocrine: Negative for polydipsia, polyphagia and polyuria.    Hematologic/Lymphatic: Negative for bleeding problem.  Skin: Negative for flushing and nail changes.  Musculoskeletal: Negative for muscle cramps, muscle weakness and myalgias.  Gastrointestinal: Negative for abdominal pain, dysphagia, hematemesis, hematochezia, melena, nausea and vomiting.  Neurological: Negative for dizziness, focal weakness and light-headedness.   PHYSICAL EXAM: Vitals with BMI 06/07/2019 06/06/2019 05/30/2019  Height 5\' 7"  5\' 4"  5\' 6"   Weight 141 lbs 145 lbs 143 lbs  BMI 22.08 [id number]  Systolic [id number]  Diastolic 75 72 58  Pulse 99 - 61   CONSTITUTIONAL: Age-appropriate, frail elderly female, no acute distress, hemodynamically stable.  SKIN: Skin is warm and dry. No rash noted. No cyanosis. No pallor. No jaundice HEAD: Normocephalic and atraumatic.  EYES: No scleral icterus MOUTH/THROAT: Moist oral membranes.  NECK: No JVD present. No thyromegaly noted. LYMPHATIC: No visible cervical adenopathy.  CHEST Normal respiratory effort. No intercostal retractions.  Pacemaker site at the left infraclavicular region is healing well.  LUNGS: Clear to auscultation bilaterally.  No stridor. No wheezes. No rales.  CARDIOVASCULAR: Regular, tachycardia, positive [id number], 3+ holosystolic murmur heard at the apex, no gallops or rubs appreciated. ABDOMINAL: Nonobese, soft, nontender nondistended, positive bowel sounds in all 4 quadrants, no apparent ascites.  EXTREMITIES: No peripheral edema.  Warm to touch bilaterally. HEMATOLOGIC: No significant bruising NEUROLOGIC: Oriented to person, place, and time. Nonfocal. Normal muscle tone.  PSYCHIATRIC: Normal mood and affect. Normal behavior. Cooperative  CARDIAC DATABASE: Pacemaker:  Successful implantation of a St Jude Medical Assurity MRI dual-chamber pacemaker for symptomatic bradycardia on 05/21/2019  EKG: 05/30/2019: Atrial sensed and ventricular paced rhythm, left axis deviation, poor R wave progression, inferior infarct  age-indeterminate, without evidence of acute myocardial injury pattern. 06/07/2019: Sinus tachycardia, ventricular rate of 101 bpm, right bundle branch block, probable old inferior infarct.  Since last EKG the underlying rhythm has changed from paced ventricular rhythm to sinus tachycardia.  Echocardiogram: April 05, 2019: LVEF 56%, moderate left ventricular hypertrophy, normal wall motion, moderately dilated left atrium, moderate MR, moderate TR, moderate AR.  Stress Testing:  Stress cardiolite 3/12 [facility]) no ischemia .   Heart Catheterization: Coronary angiogram 2010 (no stents-Dr. Peter Martinique): Mild  coronary calcification, Normal EF.   LABORATORY DATA: CBC Latest Ref Rng & Units 05/29/2019 05/21/2019 05/20/2019  WBC 4.0 - 10.5 K/uL 9.4 8.8 10.7(H)  Hemoglobin 12.0 - 15.0 g/dL 11.2(L) 10.5(L) 10.4(L)  Hematocrit 36.0 - 46.0 % 34.4(L) 31.9(L) 31.2(L)  Platelets 150 - 400 K/uL 239 178 177    CMP Latest Ref Rng & Units 05/29/2019 05/21/2019 05/19/2019  Glucose 70 - 99 mg/dL 136(H) 142(H) 200(H)  BUN 8 - 23 mg/dL 18 8 17   Creatinine 0.44 - 1.00 mg/dL 0.98 0.89 1.29(H)  Sodium 135 - 145 mmol/L 138 141 137  Potassium 3.5 - 5.1 mmol/L 4.2 4.1 4.8  Chloride 98 - 111 mmol/L 102 109 108  CO2 22 - 32 mmol/L 24 24 18(L)  Calcium 8.9 - 10.3 mg/dL 10.4(H) 8.5(L) 8.3(L)  Total Protein 6.5 - 8.1 g/dL - 5.3(L) -  Total Bilirubin 0.3 - 1.2 mg/dL - 0.8 -  Alkaline Phos 38 - 126 U/L - 90 -  AST 15 - 41 U/L - 27 -  ALT 0 - 44 U/L - 29 -    Lipid Panel     Component Value Date/Time   CHOL 123 03/16/2019 0932   TRIG 103 03/16/2019 0932   HDL 37 (L) 03/16/2019 0932   CHOLHDL 2.9 05/05/2010 0200   VLDL 33 05/05/2010 0200   LDLCALC 67 03/16/2019 0932   LABVLDL 19 03/16/2019 0932    Lab Results  Component Value Date   HGBA1C 5.8 (H) 03/16/2019   HGBA1C 5.9 (H) 05/23/2015   HGBA1C 5.5 04/25/2015   No components found for: NTPROBNP Lab Results  Component Value Date   TSH 6.601 (H)  03/23/2019   TSH 2.860 03/16/2019   TSH 4.198 04/30/2015    Cardiac Panel (last 3 results) No results for input(s): CKTOTAL, CKMB, TROPONINIHS, RELINDX in the last 72 hours.  FINAL MEDICATION LIST END OF ENCOUNTER: Meds ordered this encounter  Medications  . flecainide (TAMBOCOR) 100 MG tablet    Sig: Take 0.5 tablets (50 mg total) by mouth every 12 (twelve) hours.    Dispense:  60 tablet    Refill:  2    Medications Discontinued During This Encounter  Medication Reason  . flecainide (TAMBOCOR) 100 MG tablet      Current Outpatient Medications:  .  albuterol (PROVENTIL HFA;VENTOLIN HFA) 108 (90 Base) MCG/ACT inhaler, Inhale 1-2 puffs into the lungs every 6 (six) hours as needed for wheezing or shortness of breath. , Disp: , Rfl:  .  Alpha-Lipoic Acid 300 MG TABS, Take 300 mg by mouth 2 (two) times daily. , Disp: , Rfl:  .  anastrozole (ARIMIDEX) 1 MG tablet, Take 1 tablet (1 mg total) by mouth daily., Disp: 90 tablet, Rfl: 3 .  Cholecalciferol (VITAMIN D) 2000 UNITS tablet, Take 2,000 Units by mouth daily with lunch., Disp: , Rfl:  .  Cyanocobalamin (B-12) 1000 MCG TABS, Take 1,000 mcg by mouth daily with lunch. , Disp: , Rfl:  .  flecainide (TAMBOCOR) 100 MG tablet, Take 0.5 tablets (50 mg total) by mouth every 12 (twelve) hours., Disp: 60 tablet, Rfl: 2 .  Fluticasone-Salmeterol (ADVAIR) 250-50 MCG/DOSE AEPB, Inhale 1 puff into the lungs every 12 (twelve) hours. , Disp: , Rfl:  .  gabapentin (NEURONTIN) 600 MG tablet, Take 600 mg by mouth 2 (two) times daily., Disp: , Rfl:  .  levothyroxine (SYNTHROID, LEVOTHROID) 200 MCG tablet, Take 200 mcg by mouth daily before breakfast., Disp: , Rfl:  .  Magnesium Oxide 500  MG TABS, Take 500 mg by mouth at bedtime. , Disp: , Rfl:  .  metFORMIN (GLUCOPHAGE-XR) 500 MG 24 hr tablet, Take 500 mg by mouth 2 (two) times daily with a meal., Disp: , Rfl:  .  metoprolol succinate (TOPROL-XL) 50 MG 24 hr tablet, TAKE 1 TABLET (50 MG TOTAL) BY MOUTH  DAILY. TAKE WITH OR IMMEDIATELY FOLLOWING A MEAL. (Patient taking differently: Take 25 mg by mouth daily. ), Disp: 90 tablet, Rfl: 0 .  montelukast (SINGULAIR) 10 MG tablet, Take 10 mg by mouth daily., Disp: , Rfl:  .  omega-3 acid ethyl esters (LOVAZA) 1 g capsule, Take 1 g by mouth 2 (two) times daily., Disp: , Rfl:  .  Probiotic Product (PROBIOTIC PO), Take 1 tablet by mouth daily., Disp: , Rfl:  .  PROLIA 60 MG/ML SOSY injection, Inject 60 mg into the vein every 6 (six) months., Disp: , Rfl:  .  Rivaroxaban (XARELTO) 15 MG TABS tablet, Take 1 tablet (15 mg total) by mouth daily with supper., Disp: 30 tablet, Rfl: 6 .  traMADol-acetaminophen (ULTRACET) 37.5-325 MG tablet, Take 1 tablet by mouth every 6 (six) hours as needed (pain)., Disp: , Rfl:  .  triamterene-hydrochlorothiazide (MAXZIDE-25) 37.5-25 MG tablet, Take 1 tablet by mouth daily as needed (for fluid retention/swelling.). , Disp: , Rfl:   IMPRESSION:    ICD-10-CM   1. Essential hypertension  I10 EKG 12-Lead  2. Paroxysmal atrial fibrillation (HCC)  I48.0 flecainide (TAMBOCOR) 100 MG tablet  3. Atrial flutter, paroxysmal (HCC)  I48.92   4. Long term current use of anticoagulant therapy  123456 Basic Metabolic Panel (BMET)    Magnesium  5. Long term current use of antiarrhythmic drug  Z79.899   6. Sick sinus syndrome (HCC)  I49.5   7. Pacemaker [place] MRI dual-chamber pacemaker 05/21/2019  Z95.0   8. Controlled type 2 diabetes mellitus without complication, without long-term current use of insulin (HCC)  E11.9      RECOMMENDATIONS: Patty [name] is a 79 y.o. female whose past medical history and cardiovascular risk factors include: Sinus node dysfunction, sick sinus syndrome, paroxysmal atrial fibrillation/flutter, hypertension, diabetes mellitus type 2, chronic kidney disease stage III, on long-term oral anticoagulation, postmenopausal female, advanced age, status post permanent pacemaker  implantation.  Hypotension: Resolved.  Recommend compression stockings bilaterally to improve with lower extremity swelling.  If she does require diuretic therapy as needed basis patient is asked to call the office at which time may consider changing Maxide to only diuretic therapy.  Monitor for now.  Pacemaker in situ, [place] MRI dual chamber pacemaker:  Most recent pacemaker transmission discussed with the patient.    Recommend restarting flecainide at 50 mg p.o. twice daily.  Medications updated.  Patient's metoprolol has already been decreased to 25 mg p.o. daily continue for now.   The pacemaker site is healing well no signs of infection.  History of symptomatic bradycardia: Status post dual-chamber pacemaker.  Paroxysmal atrial fibrillation/flutter: . Rate control: Metoprolol (metoprolol was reduced to 25 mg p.o. daily) . Rhythm control: Flecainide (restart flecainide at 50 mg p.o. twice daily) . Thromboembolic prophylaxis: Xarelto (will be refilled once lab work is done).  . CHA2DS2-VASc SCORE is 6 which correlates to >9 % risk of stroke per year. . Patient does not endorse any evidence of bleeding.  Long-term oral anticoagulation:  Currently on Xarelto for thromboembolic prophylaxis given her history of paroxysmal atrial fibrillation/flutter.  She does not endorse any  evidence of bleeding.  Patient is requesting a refill on Xarelto.   Check kidney function prior to prescribing Xarelto.  Patient was originally on 20 mg p.o. q. supper but he was transitioned to 15 mg p.o. q. supper given her CKD.  Check BMP and magnesium level  Diabetes mellitus type 2 with chronic kidney disease: Currently managed by primary team.  Would recommend statin therapy given her diagnosis of diabetes mellitus type 2.  Will defer to primary team.  Orders Placed This Encounter  Procedures  . Basic Metabolic Panel (BMET)  . Magnesium  . EKG 12-Lead   --Continue cardiac  medications as reconciled in final medication list. --Return in about 6 weeks (around 07/19/2019). Or sooner if needed. --Continue follow-up with your primary care physician regarding the management of your other chronic comorbid conditions.  Patient's questions and concerns were addressed to her satisfaction. She voices understanding of the instructions provided during this encounter.   This note was created using a voice recognition software as a result there may be grammatical errors inadvertently enclosed that do not reflect the nature of this encounter. Every attempt is made to correct such errors.  Rex Kras, [place], [facility]  Pager: [phone] Office: [phone]

## 2019-06-08 DIAGNOSIS — Z7901 Long term (current) use of anticoagulants: Secondary | ICD-10-CM | POA: Diagnosis not present

## 2019-06-09 LAB — BASIC METABOLIC PANEL
BUN/Creatinine Ratio: 13 (ref 12–28)
BUN: 11 mg/dL (ref 8–27)
CO2: 22 mmol/L (ref 20–29)
Calcium: 10.2 mg/dL (ref 8.7–10.3)
Chloride: 102 mmol/L (ref 96–106)
Creatinine, Ser: 0.85 mg/dL (ref 0.57–1.00)
GFR calc Af Amer: 75 mL/min/{1.73_m2} (ref >59)
GFR calc non Af Amer: 65 mL/min/{1.73_m2} (ref >59)
Glucose: 100 mg/dL — ABNORMAL HIGH (ref 65–99)
Potassium: 4.6 mmol/L (ref 3.5–5.2)
Sodium: 143 mmol/L (ref 134–144)

## 2019-06-09 LAB — MAGNESIUM: Magnesium: 1.5 mg/dL — ABNORMAL LOW (ref 1.6–2.3)

## 2019-06-14 DIAGNOSIS — N3020 Other chronic cystitis without hematuria: Secondary | ICD-10-CM | POA: Diagnosis not present

## 2019-06-14 DIAGNOSIS — N312 Flaccid neuropathic bladder, not elsewhere classified: Secondary | ICD-10-CM | POA: Diagnosis not present

## 2019-06-14 DIAGNOSIS — R825 Elevated urine levels of drugs, medicaments and biological substances: Secondary | ICD-10-CM | POA: Diagnosis not present

## 2019-06-18 ENCOUNTER — Other Ambulatory Visit: Payer: Self-pay

## 2019-06-18 ENCOUNTER — Encounter: Payer: Self-pay | Admitting: Cardiology

## 2019-06-18 ENCOUNTER — Ambulatory Visit: Payer: Medicare Other | Admitting: Cardiology

## 2019-06-18 VITALS — BP 117/64 | HR 62 | Ht 67.0 in | Wt 148.0 lb

## 2019-06-18 DIAGNOSIS — Z45018 Encounter for adjustment and management of other part of cardiac pacemaker: Secondary | ICD-10-CM | POA: Diagnosis not present

## 2019-06-18 DIAGNOSIS — I480 Paroxysmal atrial fibrillation: Secondary | ICD-10-CM

## 2019-06-18 DIAGNOSIS — R0609 Other forms of dyspnea: Secondary | ICD-10-CM

## 2019-06-18 DIAGNOSIS — I495 Sick sinus syndrome: Secondary | ICD-10-CM

## 2019-06-18 DIAGNOSIS — Z950 Presence of cardiac pacemaker: Secondary | ICD-10-CM | POA: Diagnosis not present

## 2019-06-18 DIAGNOSIS — I4892 Unspecified atrial flutter: Secondary | ICD-10-CM

## 2019-06-18 NOTE — Progress Notes (Signed)
Primary Physician/Referring:  Deland Pretty, MD  Patient ID: Patty [name], female    DOB: 08-Jul-1940, 79 y.o.   MRN: [id number]  Chief Complaint  Patient presents with  . Atrial Fibrillation    PT C/O sob  . Shortness of Breath   HPI:    Patty [name]  is a 79 y.o.  female  with  Paroxysmal symptomatic atrial flutter/atrial fibrillation, S/P  ablation on 08/19/2015 and again on 04/14/2018 by Dr. Thompson Grayer, back in atrial fibrillation in March 2020 which she underwent direct current cardioversion. She has h/o lymphoma treated in 2015 and is in remission, right breast cancer, status post lumpectomy in 2017 followed by chemotherapy therapy. She is presently on long-term anticoagulation. Medical history is also significant for controlled diabetes mellitus, hyperlipidemia, hypertension.   Patient admitted patient was admitted on 05/19/2019 and discharged 4 days later when she presented with cardiogenic shock due to complete heart block and sinus arrest needing inotropic support and underwent successful [place] pacemaker implantation on 05/21/2019.  She was also started back on flecainide due to persistent atrial flutter noted post procedure, she spontaneously converted to sinus rhythm.  However during repeat visit, flecainide was discontinued for fatigue and not doing well and dizziness and had recurrence of atrial flutter hence was started back on low-dose.  Patient is being seen on a stat basis due to marked dyspnea and fatigue.  Patient states that since starting flecainide she has not felt well, also states that she has noticed her heart rate to be very low.  Accompanied by her husband.  Past Medical History:  Diagnosis Date  . Acute on chronic combined systolic and diastolic CHF (congestive heart failure) ([place])   . Arthritis   . Asthma   . Atrial flutter (HCC)    PAROXYSMAL, EKG 10/06/2017, ATRIAL FLUTTER (ATYPICAL) WITH VARIABLE VENTRICULAR RESPONCE AND CONTROLLED  . Atrial  flutter, paroxysmal ([place]) 06/02/2018  . Breast cancer, right ([place]) 2/17   R sided, s/p total mastectomy  . Bulging lumbar disc    3  . Chronic kidney disease    recurring UTI  . Complication of anesthesia    "morphine made me stop breathing"  . Concentric left ventricular hypertrophy    MILD DECREASE IN GLOBAL WALL MOTION. DOPPLER EVIDENCE OF GRADE II (PSEUDONORMAL) DIASTOLIC DYSFUNCTION, ELEVATED LAP, CALCULATED EF 45%, LEFT ATRIAL CAVITY IS MILDLY DILATED. MILD TO MODERATE AORTIC REGURGITATION. MILD (GRADE I) MITRAL REGURGITATION. MILD TRICUSPID REGURGITATION. ESTIMATED PULMONARY ARTERY SYSTOLIC PRESSURE 22 MMHG.  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 Encounter for care of pacemaker 05/21/2019  . First degree AV block   . HCAP (healthcare-associated pneumonia) 12/2013   Archie Endo 01/18/2014  . History of cardioversion 06/02/2018  . History of skin cancer   . History of transfusion   . Hypertension   . Hypothyroidism   . Left atrial enlargement   . Leg swelling   . 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 Malaise and fatigue   . Nocturia   . Nonischemic cardiomyopathy ([place]) 06/02/2018  . Pacemaker [place] MRI dual-chamber pacemaker 05/21/2019 05/21/2019  . Paroxysmal atrial fibrillation (HCC)   . Persistent atrial fibrillation ([place])   . Pure hypercholesterolemia   . Shortness of breath dyspnea    "at times"  . Skin cancer   . Sleep apnea    uses c-pap  . Type II diabetes mellitus ([place])  Past Surgical History:  Procedure Laterality Date  . ABDOMINAL HYSTERECTOMY  1972   partial  . ABDOMINAL HYSTERECTOMY    . APPENDECTOMY  1953  . ATRIAL FIBRILLATION ABLATION  08/19/2015   PVI with CTI ablation by Dr Rayann Heman  . ATRIAL FIBRILLATION ABLATION N/A 04/14/2018   Procedure: ATRIAL FIBRILLATION ABLATION;  Surgeon: Thompson Grayer, MD;  Location: [facility];  Service: Cardiovascular;  Laterality: N/A;    . BACK SURGERY     x2  . BIOPSY  02/13/2018   Procedure: BIOPSY;  Surgeon: Ronnette Juniper, MD;  Location: [facility];  Service: Gastroenterology;;  X2=EGD & Colon  . BREAST SURGERY     Right mastectomy  . CARDIAC CATHETERIZATION  10/10/2008   Nonobstructive CAD  . CARDIOVERSION  04/13/2011   Procedure: CARDIOVERSION;  Surgeon: Laverda Page, MD;  Location: [facility];  Service: Cardiovascular;  Laterality: N/A;  . CARDIOVERSION N/A 06/18/2015   Procedure: CARDIOVERSION;  Surgeon: Adrian Prows, MD;  Location: [facility];  Service: Cardiovascular;  Laterality: N/A;  . CARDIOVERSION N/A 10/26/2016   Procedure: CARDIOVERSION;  Surgeon: Adrian Prows, MD;  Location: [facility];  Service: Cardiovascular;  Laterality: N/A;  . CARDIOVERSION N/A 10/11/2017   Procedure: CARDIOVERSION;  Surgeon: Nigel Mormon, MD;  Location: [facility];  Service: Cardiovascular;  Laterality: N/A;  . CARDIOVERSION N/A 05/23/2018   Procedure: CARDIOVERSION;  Surgeon: Adrian Prows, MD;  Location: [facility];  Service: Cardiovascular;  Laterality: N/A;  . CATARACT EXTRACTION W/ INTRAOCULAR LENS  IMPLANT, BILATERAL Bilateral   . CHOLECYSTECTOMY  1993  . COLONOSCOPY N/A 02/13/2018   Procedure: COLONOSCOPY;  Surgeon: Ronnette Juniper, MD;  Location: [facility];  Service: Gastroenterology;  Laterality: N/A;  . ELECTROPHYSIOLOGIC STUDY N/A 08/19/2015   Procedure: Atrial Fibrillation Ablation;  Surgeon: Thompson Grayer, MD;  Location: [facility];  Service: Cardiovascular;  Laterality: N/A;  . ESOPHAGOGASTRODUODENOSCOPY N/A 02/13/2018   Procedure: ESOPHAGOGASTRODUODENOSCOPY (EGD);  Surgeon: Ronnette Juniper, MD;  Location: [facility];  Service: Gastroenterology;  Laterality: N/A;  . FOOT SURGERY    . INSERT / REPLACE / REMOVE PACEMAKER     [employer] MRI dual-chamber pacemaker  . IR REMOVAL TUN ACCESS W/ PORT W/O FL MOD SED  04/01/2017  . LUMBAR LAMINECTOMY    . MASS BIOPSY Left 12/07/2013   Procedure: EXCISIONAL  BIOPSY LEFT NECK MASS ;  Surgeon: Jerrell Belfast, MD;  Location: [place] [facility];  Service: ENT;  Laterality: Left;  Marland Kitchen MASTECTOMY Right 2017   malignant  . PACEMAKER IMPLANT N/A 05/21/2019   Procedure: PACEMAKER IMPLANT;  Surgeon: Constance Haw, MD;  Location: [facility];  Service: Cardiovascular;  Laterality: N/A;  . POLYPECTOMY  02/13/2018   Procedure: POLYPECTOMY;  Surgeon: Ronnette Juniper, MD;  Location: [facility];  Service: Gastroenterology;;  EGD  . PORTACATH PLACEMENT  12/2013   Archie Endo 01/04/2014  . PUBOVAGINAL SLING    . SKIN TAG REMOVAL  2012   leg  . TEE WITHOUT CARDIOVERSION N/A 08/19/2015   Procedure: TRANSESOPHAGEAL ECHOCARDIOGRAM (TEE);  Surgeon: Larey Dresser, MD;  Location: [facility];  Service: Cardiovascular;  Laterality: N/A;  . TONSILLECTOMY    . TOTAL MASTECTOMY Right 05/27/2015   Procedure: RIGHT TOTAL MASTECTOMY;  Surgeon: Fanny Skates, MD;  Location: [facility];  Service: General;  Laterality: Right;   Social History   Tobacco Use  . Smoking status: Never Smoker  . Smokeless tobacco: Never Used  Substance Use Topics  . Alcohol use: No  Alcohol/week: 0.0 standard drinks   Family History  Problem Relation Age of Onset  . Stroke Father 47  . Heart attack Father 106  . Hypertension Father   . Heart disease Mother   . Arthritis Mother   . Breast cancer Mother 87  . Heart failure Mother   . Heart attack Brother   . Hypertension Brother   . Diabetes Sister   . Cancer Sister 22    ROS  Review of Systems  Constitution: Positive for malaise/fatigue.  Cardiovascular: Positive for dyspnea on exertion. Negative for chest pain and leg swelling.  Gastrointestinal: Negative for melena.   Objective  Blood pressure 117/64, pulse 62, height 5\' 7"  (1.702 m), weight 148 lb (67.1 kg), SpO2 97 %.  Vitals with BMI 06/18/2019 06/07/2019 06/06/2019  Height 5\' 7"  5\' 7"  5\' 4"   Weight 148 lbs 141 lbs 145 lbs  BMI 23.17 [id number]  Systolic [id number]  Diastolic 64 75  72  Pulse 62 99 -     Physical Exam  Constitutional: No distress.  petite  Cardiovascular: Regular rhythm, normal heart sounds, intact distal pulses and normal pulses. Tachycardia present. Exam reveals no gallop.  No murmur heard. No leg edema, no JVD.  Pulmonary/Chest: Effort normal and breath sounds normal.  Abdominal: Soft. Bowel sounds are normal.   Laboratory examination:   Recent Labs    05/21/19 0021 05/29/19 1653 06/08/19 1007  NA 141 138 143  K 4.1 4.2 4.6  CL 109 102 102  CO2 24 24 22   GLUCOSE 142* 136* 100*  BUN 8 18 11   CREATININE 0.89 0.98 0.85  CALCIUM 8.5* 10.4* 10.2  GFRNONAA >60 54* 65  GFRAA >60 >60 75   estimated creatinine clearance is 51.3 mL/min (by C-G formula based on SCr of 0.85 mg/dL).  CMP Latest Ref Rng & Units 06/08/2019 05/29/2019 05/21/2019  Glucose 65 - 99 mg/dL 100(H) 136(H) 142(H)  BUN 8 - 27 mg/dL 11 18 8   Creatinine 0.57 - 1.00 mg/dL 0.85 0.98 0.89  Sodium 134 - 144 mmol/L 143 138 141  Potassium 3.5 - 5.2 mmol/L 4.6 4.2 4.1  Chloride 96 - 106 mmol/L 102 102 109  CO2 20 - 29 mmol/L 22 24 24   Calcium 8.7 - 10.3 mg/dL 10.2 10.4(H) 8.5(L)  Total Protein 6.5 - 8.1 g/dL - - 5.3(L)  Total Bilirubin 0.3 - 1.2 mg/dL - - 0.8  Alkaline Phos 38 - 126 U/L - - 90  AST 15 - 41 U/L - - 27  ALT 0 - 44 U/L - - 29   CBC Latest Ref Rng & Units 05/29/2019 05/21/2019 05/20/2019  WBC 4.0 - 10.5 K/uL 9.4 8.8 10.7(H)  Hemoglobin 12.0 - 15.0 g/dL 11.2(L) 10.5(L) 10.4(L)  Hematocrit 36.0 - 46.0 % 34.4(L) 31.9(L) 31.2(L)  Platelets 150 - 400 K/uL 239 178 177   Lipid Panel     Component Value Date/Time   CHOL 123 03/16/2019 0932   TRIG 103 03/16/2019 0932   HDL 37 (L) 03/16/2019 0932   CHOLHDL 2.9 05/05/2010 0200   VLDL 33 05/05/2010 0200   LDLCALC 67 03/16/2019 0932   HEMOGLOBIN A1C Lab Results  Component Value Date   HGBA1C 5.8 (H) 03/16/2019   MPG 123 05/23/2015   TSH Recent Labs    03/16/19 0932 03/23/19 1839  TSH 2.860 6.601*     Medications and allergies   Allergies  Allergen Reactions  . Morphine And Related Anaphylaxis and Other (See Comments)    Pt  states she stopped breathing post op- "went into resp. arrest"  . Multaq [Dronedarone] Other (See Comments)    Reaction:  Blood in urine and elevated liver enzymes.   . Demerol Nausea And Vomiting  . Oysters [Shellfish Allergy] Rash    & CLAMS  . Penicillins Itching and Rash    Has patient had a PCN reaction causing immediate rash, facial/tongue/throat swelling, SOB or lightheadedness with hypotension: No Has patient had a PCN reaction causing severe rash involving mucus membranes or skin necrosis: Yes Has patient had a PCN reaction that required hospitalization No Has patient had a PCN reaction occurring within the last 10 years: No If all of the above answers are "NO", then may proceed with Cephalosporin use.   [facility] Cross Reactors Rash     Current Outpatient Medications  Medication Instructions  . albuterol (PROVENTIL HFA;VENTOLIN HFA) 108 (90 Base) MCG/ACT inhaler 1-2 puffs, Inhalation, Every 6 hours PRN  . Alpha-Lipoic Acid 300 mg, Oral, 2 times daily  . anastrozole (ARIMIDEX) 1 mg, Oral, Daily  . B-12 1,000 mcg, Oral, Daily with lunch  . flecainide (TAMBOCOR) 50 mg, Oral, Every 12 hours  . Fluticasone-Salmeterol (ADVAIR) 250-50 MCG/DOSE AEPB 1 puff, Inhalation, Every 12 hours  . gabapentin (NEURONTIN) 600 mg, Oral, 2 times daily  . levothyroxine (SYNTHROID) 200 mcg, Oral, Daily before breakfast  . Magnesium Oxide 500 mg, Oral, Daily at bedtime  . metFORMIN (GLUCOPHAGE-XR) 500 mg, Oral, 2 times daily with meals  . metoprolol succinate (TOPROL-XL) 50 MG 24 hr tablet TAKE 1 TABLET (50 MG TOTAL) BY MOUTH DAILY. TAKE WITH OR IMMEDIATELY FOLLOWING A MEAL.  . montelukast (SINGULAIR) 10 mg, Oral, Daily  . omega-3 acid ethyl esters (LOVAZA) 1 g, Oral, 2 times daily  . Probiotic Product (PROBIOTIC PO) 1 tablet, Oral, Daily  . Prolia 60 mg,  Intravenous, Every 6 months  . Rivaroxaban (XARELTO) 15 mg, Oral, Daily with supper  . traMADol-acetaminophen (ULTRACET) 37.5-325 MG tablet 1 tablet, Oral, Every 6 hours PRN  . triamterene-hydrochlorothiazide (MAXZIDE-25) 37.5-25 MG tablet 1 tablet, Oral, Daily PRN  . Vitamin D 2,000 Units, Oral, Daily with lunch    Radiology:  No results found.  Cardiac Studies:   Coronary angiogram 2010 (no stents-Dr. Peter Martinique)  Mild coronary calcification, Normal EF. Admitted to [facility] in March 2012 for A. Fibrillation with RVR. Stress cardiolite 3/12 [facility]) no ischemia .   Echocardiogram 04/05/2019:  Left ventricle cavity is normal in size. Moderate concentric hypertrophy  of the left ventricle. Normal global wall motion. Normal LV systolic  function with EF 56%. Unable to evaluate diastolic function due to atrial  fibrillation.  Left atrial cavity is moderately dilated.  Trileaflet aortic valve. Moderate (Grade II) aortic regurgitation.  Moderate (Grade II) mitral regurgitation.  Moderate tricuspid regurgitation. Estimated pulmonary artery systolic  pressure is 30 mmHg, increased from 22 mmHg on previous study on  11/09/2017.   Unscheduled (Alert) 06/06/2019 There were 3523 atrial high rate episodes detected. Review of available episode details demonstrates AT/AF. The longest lasted 00:03:20:28 in duration. There was a 43.0 % cumulative atrial arrhythmia burden. There were 0 high ventricular rate episodes detected. Health trends do not demonstrate significant abnormality. Battery longevity is 5.1 years. RA pacing is 73.0 %, RV pacing is 85.0 %.  Scheduled  In office pacemaker check 06/18/19  Single (S)/Dual (D)/BV: D. Presenting AVP @ 65/min. Pacemaker dependant:  No. Underlying SB with 1st degree 55mS. AP 72%, VP 83%. AMS Episodes 4256.  AT/AF burden 28% . Longest 22 hours on 06/06/2019. Latest <10 S 06/18/2019. HVR 0.  Longevity 6.5 Years. Magnet rate: >85%. Lead  measurements: Stable.Marland Kitchen Histogram: Low (L)/normal (N)/high (H)  N. Patient activity Low.   Observations: Normal pacemaker function. Acute settings. Changes: Turn RV auto capture, Increase base rate to 70/min,  Changed AV delay to dynamic.  EKG:  EKG 06/18/2019: Sinus rhythm with first-degree AV block at the rate of 60 bpm, ventricularly paced rhythm.  No further analysis.    EKG 06/07/2019: Atypical atrial flutter with 2: 1 conduction, ventricular rate 101 bpm, left axis deviation, left anterior fascicular block, cannot exclude inferior infarct old. RBBB . Diffuse nonspecific ST-T abnormality, cannot exclude inferior and lateral ischemia. Compared to 06/01/2019, AV paced rhythm are present. Compared to  EKG 04/24/2019: Sinus bradycardia at rate of 50 bpm with first-degree AV block, PR interval 340 ms. Otherwise no change  Assessment      ICD-10-CM   1. Encounter for care of pacemaker  Z45.018   2. Pacemaker [place] MRI dual-chamber pacemaker 05/21/2019  Z95.0   3. Sick sinus syndrome (HCC)  I49.5   4. Paroxysmal atrial fibrillation (HCC)  I48.0 EKG 12-Lead  5. Atrial flutter, paroxysmal (HCC)  I48.92   6. Dyspnea on exertion  R06.00 EKG 12-Lead     (Unable to tolerate Multaq due to abnorma LFT. Flecainide caused hight degree AV Block. Consider Tikosyn if recurrent A. FIb)   No orders of the defined types were placed in this encounter.   There are no discontinued medications.   Recommendations:   Patty [name]  is a 79 y.o.  female  with  Paroxysmal symptomatic atrial flutter/atrial fibrillation, S/P  ablation on 08/19/2015 and again on 04/14/2018 by Dr. Thompson Grayer, breakthrough atrial fibrillation needing cardioversions, h/o lymphoma treated in 2015 and is in remission, right breast cancer, status post lumpectomy in 2017 followed by chemotherapy therapy, controlled diabetes mellitus, hyperlipidemia, hypertension. Patient admitted patient was admitted on 05/19/2019 and  discharged 4 days later when she presented with cardiogenic shock due to complete heart block and sinus arrest needing inotropic support and underwent successful [place] pacemaker implantation on 05/21/2019.  She was also started back on flecainide due to persistent atrial flutter, since then has noticed marked fatigue and dyspnea.  Also has noticed low heart rate.  I made the patient walk in the hallway, heart rate did not go greater than 67 bpm, and on further review of the pacemaker, she has had very low activity.  I increased her basal heart rate to 70 bpm to see if her symptoms of fatigue would improve, her sensor is set and fairly aggressive response, hence did not make the change there but did change her AV interval as it was markedly prolonged to improve diastolic compliance.  Hopefully this will help with fatigue and also dyspnea, for now as she is maintaining sinus rhythm on flecainide and since initiation of flecainide she has not had any further episodes of sustained atrial fibrillation or flutter, advised her to continue low-dose flecainide at 50 mg twice daily that she is presently on.  I would like to see her back in 6 weeks to see how she is doing with regard to pacemaker changes.  She will also need to be reprogrammed to chronic settings in 3 months since pacemaker implantation.  For now continue anticoagulation.  If her symptoms do not improve with change in pacemaker settings, I will discontinue flecainide and possibly consider  sotalol or amiodarone.  Adrian Prows, MD, [facility] 06/18/2019, 9:06 PM [facility] [place] Office: [phone]

## 2019-06-21 DIAGNOSIS — M170 Bilateral primary osteoarthritis of knee: Secondary | ICD-10-CM | POA: Diagnosis not present

## 2019-06-21 DIAGNOSIS — M1180 Other specified crystal arthropathies, unspecified site: Secondary | ICD-10-CM | POA: Diagnosis not present

## 2019-06-21 DIAGNOSIS — M810 Age-related osteoporosis without current pathological fracture: Secondary | ICD-10-CM | POA: Diagnosis not present

## 2019-06-21 DIAGNOSIS — M7582 Other shoulder lesions, left shoulder: Secondary | ICD-10-CM | POA: Diagnosis not present

## 2019-06-21 DIAGNOSIS — M549 Dorsalgia, unspecified: Secondary | ICD-10-CM | POA: Diagnosis not present

## 2019-06-25 ENCOUNTER — Telehealth: Payer: Medicare Other | Admitting: Internal Medicine

## 2019-06-25 DIAGNOSIS — G4700 Insomnia, unspecified: Secondary | ICD-10-CM | POA: Diagnosis not present

## 2019-06-25 DIAGNOSIS — R0600 Dyspnea, unspecified: Secondary | ICD-10-CM | POA: Diagnosis not present

## 2019-06-25 DIAGNOSIS — R5383 Other fatigue: Secondary | ICD-10-CM | POA: Diagnosis not present

## 2019-06-25 DIAGNOSIS — R05 Cough: Secondary | ICD-10-CM | POA: Diagnosis not present

## 2019-06-25 DIAGNOSIS — I10 Essential (primary) hypertension: Secondary | ICD-10-CM | POA: Diagnosis not present

## 2019-06-25 DIAGNOSIS — R5381 Other malaise: Secondary | ICD-10-CM | POA: Diagnosis not present

## 2019-06-26 ENCOUNTER — Other Ambulatory Visit: Payer: Self-pay

## 2019-06-26 ENCOUNTER — Emergency Department (HOSPITAL_COMMUNITY): Payer: Medicare Other

## 2019-06-26 ENCOUNTER — Emergency Department (HOSPITAL_COMMUNITY)
Admission: EM | Admit: 2019-06-26 | Discharge: 2019-06-27 | Disposition: A | Discharge status: Home / Self Care | Payer: Medicare Other | Attending: Emergency Medicine | Admitting: Emergency Medicine

## 2019-06-26 DIAGNOSIS — N189 Chronic kidney disease, unspecified: Secondary | ICD-10-CM | POA: Insufficient documentation

## 2019-06-26 DIAGNOSIS — R0902 Hypoxemia: Secondary | ICD-10-CM | POA: Diagnosis not present

## 2019-06-26 DIAGNOSIS — W19XXXA Unspecified fall, initial encounter: Secondary | ICD-10-CM | POA: Diagnosis not present

## 2019-06-26 DIAGNOSIS — R0602 Shortness of breath: Secondary | ICD-10-CM | POA: Diagnosis not present

## 2019-06-26 DIAGNOSIS — E039 Hypothyroidism, unspecified: Secondary | ICD-10-CM | POA: Diagnosis not present

## 2019-06-26 DIAGNOSIS — I130 Hypertensive heart and chronic kidney disease with heart failure and stage 1 through stage 4 chronic kidney disease, or unspecified chronic kidney disease: Secondary | ICD-10-CM | POA: Insufficient documentation

## 2019-06-26 DIAGNOSIS — N390 Urinary tract infection, site not specified: Secondary | ICD-10-CM | POA: Insufficient documentation

## 2019-06-26 DIAGNOSIS — I5043 Acute on chronic combined systolic (congestive) and diastolic (congestive) heart failure: Secondary | ICD-10-CM | POA: Diagnosis not present

## 2019-06-26 DIAGNOSIS — Z79899 Other long term (current) drug therapy: Secondary | ICD-10-CM | POA: Diagnosis not present

## 2019-06-26 DIAGNOSIS — E1122 Type 2 diabetes mellitus with diabetic chronic kidney disease: Secondary | ICD-10-CM | POA: Insufficient documentation

## 2019-06-26 DIAGNOSIS — Z7984 Long term (current) use of oral hypoglycemic drugs: Secondary | ICD-10-CM | POA: Diagnosis not present

## 2019-06-26 DIAGNOSIS — Z950 Presence of cardiac pacemaker: Secondary | ICD-10-CM | POA: Diagnosis not present

## 2019-06-26 LAB — CBC WITH DIFFERENTIAL/PLATELET
Abs Immature Granulocytes: 0.05 10*3/uL (ref 0.00–0.07)
Basophils Absolute: 0.1 10*3/uL (ref 0.0–0.1)
Basophils Relative: 1 %
Eosinophils Absolute: 0.2 10*3/uL (ref 0.0–0.5)
Eosinophils Relative: 2 %
HCT: 35.4 % — ABNORMAL LOW (ref 36.0–46.0)
Hemoglobin: 11.8 g/dL — ABNORMAL LOW (ref 12.0–15.0)
Immature Granulocytes: 1 %
Lymphocytes Relative: 14 %
Lymphs Abs: 1.2 10*3/uL (ref 0.7–4.0)
MCH: 33.5 pg (ref 26.0–34.0)
MCHC: 33.3 g/dL (ref 30.0–36.0)
MCV: 100.6 fL — ABNORMAL HIGH (ref 80.0–100.0)
Monocytes Absolute: 0.8 10*3/uL (ref 0.1–1.0)
Monocytes Relative: 10 %
Neutro Abs: 6.2 10*3/uL (ref 1.7–7.7)
Neutrophils Relative %: 72 %
Platelets: 211 10*3/uL (ref 150–400)
RBC: 3.52 MIL/uL — ABNORMAL LOW (ref 3.87–5.11)
RDW: 14.6 % (ref 11.5–15.5)
WBC: 8.5 10*3/uL (ref 4.0–10.5)
nRBC: 0 % (ref 0.0–0.2)

## 2019-06-26 LAB — URINALYSIS, ROUTINE W REFLEX MICROSCOPIC
Bilirubin Urine: NEGATIVE
Glucose, UA: NEGATIVE mg/dL
Hgb urine dipstick: NEGATIVE
Ketones, ur: NEGATIVE mg/dL
Nitrite: POSITIVE — AB
Protein, ur: NEGATIVE mg/dL
Specific Gravity, Urine: 1.011 (ref 1.005–1.030)
pH: 5 (ref 5.0–8.0)

## 2019-06-26 LAB — COMPREHENSIVE METABOLIC PANEL
ALT: 15 U/L (ref 0–44)
AST: 22 U/L (ref 15–41)
Albumin: 4.1 g/dL (ref 3.5–5.0)
Alkaline Phosphatase: 89 U/L (ref 38–126)
Anion gap: 8 (ref 5–15)
BUN: 17 mg/dL (ref 8–23)
CO2: 24 mmol/L (ref 22–32)
Calcium: 9.4 mg/dL (ref 8.9–10.3)
Chloride: 102 mmol/L (ref 98–111)
Creatinine, Ser: 0.68 mg/dL (ref 0.44–1.00)
GFR calc Af Amer: >60 mL/min (ref >60)
GFR calc non Af Amer: >60 mL/min (ref >60)
Glucose, Bld: 100 mg/dL — ABNORMAL HIGH (ref 70–99)
Potassium: 4.4 mmol/L (ref 3.5–5.1)
Sodium: 134 mmol/L — ABNORMAL LOW (ref 135–145)
Total Bilirubin: 0.9 mg/dL (ref 0.3–1.2)
Total Protein: 6.5 g/dL (ref 6.5–8.1)

## 2019-06-26 LAB — BRAIN NATRIURETIC PEPTIDE: B Natriuretic Peptide: 849.7 pg/mL — ABNORMAL HIGH (ref 0.0–100.0)

## 2019-06-26 NOTE — ED Triage Notes (Signed)
Patient bib gems, says patient started feeling bad Saturday. Had a fall on Sunday. Went to pcp yesterday. She has history of asthma, diagnosed with pleural effusion, patient stated she had used her asthma medicines 5 times yesterday. EMS put patient on 2  L o2 en route, coarse wheezing noted in RUL.

## 2019-06-26 NOTE — ED Provider Notes (Signed)
[facility] DEPT Provider Note   CSN: [id number] Arrival date & time: 06/26/19  1750     History Chief Complaint  Patient presents with  . Shortness of Breath    Patty [name] is a 79 y.o. female.  HPI   Patient presents with dyspnea.  Patient acknowledges this, but states that she was largely encouraged to come here by her primary care physician after she reported her dyspnea. She knowledges multiple medical issues, though she has some difficulty with recall of what they are specifically. It seems as though she has a history of both congestive heart failure and pleural effusion. She denies recent fever, sustained pain anywhere, is unclear when the dyspnea began, or if there are any clear alleviating or exacerbating factors. Patient has reportedly had 5 doses of her asthma medication, without change, however.  In addition, EMS reports that the patient required 2 L of nasal cannula supplemental oxygen to have appropriate saturation.   Past Medical History:  Diagnosis Date  . Acute on chronic combined systolic and diastolic CHF (congestive heart failure) ([place])   . Arthritis   . Asthma   . Atrial flutter (HCC)    PAROXYSMAL, EKG 10/06/2017, ATRIAL FLUTTER (ATYPICAL) WITH VARIABLE VENTRICULAR RESPONCE AND CONTROLLED  . Atrial flutter, paroxysmal ([place]) 06/02/2018  . 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 Bulging lumbar disc    3  . Chronic kidney disease    recurring UTI  . Complication of anesthesia    "morphine made me stop breathing"  . Concentric left ventricular hypertrophy    MILD DECREASE IN GLOBAL WALL MOTION. DOPPLER EVIDENCE OF GRADE II (PSEUDONORMAL) DIASTOLIC DYSFUNCTION, ELEVATED LAP, CALCULATED EF 45%, LEFT ATRIAL CAVITY IS MILDLY DILATED. MILD TO MODERATE AORTIC REGURGITATION. MILD (GRADE I) MITRAL REGURGITATION. MILD TRICUSPID REGURGITATION. ESTIMATED PULMONARY ARTERY SYSTOLIC PRESSURE 22 MMHG.  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 Encounter for care of pacemaker 05/21/2019  . First degree AV block   . HCAP (healthcare-associated pneumonia) 12/2013   Archie Endo 01/18/2014  . History of cardioversion 06/02/2018  . History of skin cancer   . History of transfusion   . Hypertension   . Hypothyroidism   . Left atrial enlargement   . Leg swelling   . 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 Malaise and fatigue   . Nocturia   . Nonischemic cardiomyopathy ([place]) 06/02/2018  . Pacemaker [place] MRI dual-chamber pacemaker 05/21/2019 05/21/2019  . Paroxysmal atrial fibrillation (HCC)   . Persistent atrial fibrillation ([place])   . Pure hypercholesterolemia   . Shortness of breath dyspnea    "at times"  . Skin cancer   . Sleep apnea    uses c-pap  . Type II diabetes mellitus [facility])     Patient Active Problem List   Diagnosis Date Noted  . Pacemaker [place] MRI dual-chamber pacemaker 05/21/2019 05/21/2019  . Encounter for care of pacemaker 05/21/2019  . Secondary and unspecified malignant neoplasm of lymph nodes of multiple regions ([place]) 05/20/2019  . Sick sinus syndrome ([place]) 03/23/2019  . Bradycardia 03/23/2019  . Atrial flutter, paroxysmal ([place]) 06/02/2018  . Nonischemic cardiomyopathy ([place]) 06/02/2018  . History of cardioversion 06/02/2018  . Dehydration 12/20/2017  . Nausea & vomiting 12/20/2017  . Iron deficiency anemia 12/15/2016  . 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 Weight gain  04/08/2016  . Left knee pain 12/12/2015  . Lumbar facet arthropathy 08/05/2015  . Left leg weakness 08/05/2015  . Low back pain 08/05/2015  . Diabetic peripheral neuropathy ([place]) 08/05/2015  . Bradykinesia 08/05/2015  . RBBB 07/30/2015  . Essential hypertension 07/30/2015  . Osteopenia 07/11/2015  . Cancer of right breast ([place]) 05/27/2015  . Breast cancer of upper-inner quadrant of right  female breast ([place]) 04/24/2015  . Nausea vomiting and diarrhea 03/23/2015  . Controlled type 2 diabetes mellitus without complication, without long-term current use of insulin ([place]) 03/23/2015  . Dyslipidemia associated with type 2 diabetes mellitus ([place]) 03/23/2015  . 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 Long term current use of anticoagulant therapy 04/10/2014  . UTI (urinary tract infection) 04/10/2014  . Leukocytosis 03/14/2014  . Anemia in chronic illness 12/28/2013  . 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, face, and neck ([place]) 12/26/2013  . Hypothyroidism 06/15/2010  . Paroxysmal atrial fibrillation ([place]) 05/28/2010    Past Surgical History:  Procedure Laterality Date  . ABDOMINAL HYSTERECTOMY  1972   partial  . ABDOMINAL HYSTERECTOMY    . APPENDECTOMY  1953  . ATRIAL FIBRILLATION ABLATION  08/19/2015   PVI with CTI ablation by Dr Rayann Heman  . ATRIAL FIBRILLATION ABLATION N/A 04/14/2018   Procedure: ATRIAL FIBRILLATION ABLATION;  Surgeon: Thompson Grayer, MD;  Location: [facility];  Service: Cardiovascular;  Laterality: N/A;  . BACK SURGERY     x2  . BIOPSY  02/13/2018   Procedure: BIOPSY;  Surgeon: Ronnette Juniper, MD;  Location: [facility];  Service: Gastroenterology;;  X2=EGD & Colon  . BREAST SURGERY     Right mastectomy  . CARDIAC CATHETERIZATION  10/10/2008   Nonobstructive CAD  . CARDIOVERSION  04/13/2011   Procedure: CARDIOVERSION;  Surgeon: Laverda Page, MD;  Location: [facility];  Service: Cardiovascular;  Laterality: N/A;  . CARDIOVERSION N/A 06/18/2015   Procedure: CARDIOVERSION;  Surgeon: Adrian Prows, MD;  Location: [facility] ENDOSCOPY;  Service: Cardiovascular;  Laterality: N/A;  . CARDIOVERSION N/A 10/26/2016   Procedure: CARDIOVERSION;  Surgeon: Adrian Prows, MD;  Location: [facility];  Service: Cardiovascular;  Laterality: N/A;  . CARDIOVERSION N/A 10/11/2017   Procedure: CARDIOVERSION;  Surgeon: Nigel Mormon, MD;  Location: [facility] ENDOSCOPY;   Service: Cardiovascular;  Laterality: N/A;  . CARDIOVERSION N/A 05/23/2018   Procedure: CARDIOVERSION;  Surgeon: Adrian Prows, MD;  Location: [facility];  Service: Cardiovascular;  Laterality: N/A;  . CATARACT EXTRACTION W/ INTRAOCULAR LENS  IMPLANT, BILATERAL Bilateral   . CHOLECYSTECTOMY  1993  . COLONOSCOPY N/A 02/13/2018   Procedure: COLONOSCOPY;  Surgeon: Ronnette Juniper, MD;  Location: [facility] ENDOSCOPY;  Service: Gastroenterology;  Laterality: N/A;  . ELECTROPHYSIOLOGIC STUDY N/A 08/19/2015   Procedure: Atrial Fibrillation Ablation;  Surgeon: Thompson Grayer, MD;  Location: [facility];  Service: Cardiovascular;  Laterality: N/A;  . ESOPHAGOGASTRODUODENOSCOPY N/A 02/13/2018   Procedure: ESOPHAGOGASTRODUODENOSCOPY (EGD);  Surgeon: Ronnette Juniper, MD;  Location: [facility];  Service: Gastroenterology;  Laterality: N/A;  . FOOT SURGERY    . INSERT / REPLACE / REMOVE PACEMAKER     [employer] MRI dual-chamber pacemaker  . IR REMOVAL TUN ACCESS W/ PORT W/O FL MOD SED  04/01/2017  . LUMBAR LAMINECTOMY    . MASS BIOPSY Left 12/07/2013   Procedure: EXCISIONAL BIOPSY LEFT NECK MASS ;  Surgeon: Jerrell Belfast, MD;  Location: [facility];  Service: ENT;  Laterality: Left;  Marland Kitchen MASTECTOMY Right 2017   malignant  . PACEMAKER IMPLANT  N/A 05/21/2019   Procedure: PACEMAKER IMPLANT;  Surgeon: Constance Haw, MD;  Location: [facility];  Service: Cardiovascular;  Laterality: N/A;  . POLYPECTOMY  02/13/2018   Procedure: POLYPECTOMY;  Surgeon: Ronnette Juniper, MD;  Location: [facility]OS[facility];  Service: Gastroenterology;;  EGD  . PORTACATH PLACEMENT  12/2013   Archie Endo 01/04/2014  . PUBOVAGINAL SLING    . SKIN TAG REMOVAL  2012   leg  . TEE WITHOUT CARDIOVERSION N/A 08/19/2015   Procedure: TRANSESOPHAGEAL ECHOCARDIOGRAM (TEE);  Surgeon: Larey Dresser, MD;  Location: [facility];  Service: Cardiovascular;  Laterality: N/A;  . TONSILLECTOMY    . TOTAL MASTECTOMY Right 05/27/2015   Procedure: RIGHT  TOTAL MASTECTOMY;  Surgeon: Fanny Skates, MD;  Location: [facility];  Service: General;  Laterality: Right;     OB History    Gravida  2   Para  2   Term  2   Preterm      AB      Living  2     SAB      TAB      Ectopic      Multiple      Live Births              Family History  Problem Relation Age of Onset  . Stroke Father 55  . Heart attack Father 16  . Hypertension Father   . Heart disease Mother   . Arthritis Mother   . Breast cancer Mother 42  . Heart failure Mother   . Heart attack Brother   . Hypertension Brother   . Diabetes Sister   . Cancer Sister 16    Social History   Tobacco Use  . Smoking status: Never Smoker  . Smokeless tobacco: Never Used  Substance Use Topics  . Alcohol use: No    Alcohol/week: 0.0 standard drinks  . Drug use: No    Home Medications Prior to Admission medications   Medication Sig Start Date End Date Taking? Authorizing Provider  albuterol (PROVENTIL HFA;VENTOLIN HFA) 108 (90 Base) MCG/ACT inhaler Inhale 1-2 puffs into the lungs every 6 (six) hours as needed for wheezing or shortness of breath.     [provider]  Alpha-Lipoic Acid 300 MG TABS Take 300 mg by mouth 2 (two) times daily.     [provider]  anastrozole (ARIMIDEX) 1 MG tablet Take 1 tablet (1 mg total) by mouth daily. 09/18/18   Heath Lark, MD  Cholecalciferol (VITAMIN D) 2000 UNITS tablet Take 2,000 Units by mouth daily with lunch.    [provider]  Cyanocobalamin (B-12) 1000 MCG TABS Take 1,000 mcg by mouth daily with lunch.     [provider]  flecainide (TAMBOCOR) 100 MG tablet Take 0.5 tablets (50 mg total) by mouth every 12 (twelve) hours. 06/07/19   Tolia, Sunit, DO  Fluticasone-Salmeterol (ADVAIR) 250-50 MCG/DOSE AEPB Inhale 1 puff into the lungs every 12 (twelve) hours.     [provider]  gabapentin (NEURONTIN) 600 MG tablet Take 600 mg by mouth 2 (two) times daily. 02/26/19   [provider]  levothyroxine (SYNTHROID, LEVOTHROID) 200 MCG tablet Take 200 mcg by mouth daily before breakfast.    [provider]  Magnesium Oxide 500 MG TABS Take 500 mg by mouth at bedtime.     [provider]  metFORMIN (GLUCOPHAGE-XR) 500 MG 24 hr tablet Take 500 mg by mouth 2 (two) times daily with a meal.  [provider]  metoprolol succinate (TOPROL-XL) 50 MG 24 hr tablet TAKE 1 TABLET (50 MG TOTAL) BY MOUTH DAILY. TAKE WITH OR IMMEDIATELY FOLLOWING A MEAL. Patient taking differently: Take 25 mg by mouth daily.  06/05/19   Tolia, Sunit, DO  montelukast (SINGULAIR) 10 MG tablet Take 10 mg by mouth daily.    [provider]  omega-3 acid ethyl esters (LOVAZA) 1 g capsule Take 1 g by mouth 2 (two) times daily.    [provider]  Probiotic Product (PROBIOTIC PO) Take 1 tablet by mouth daily.    [provider]  PROLIA 60 MG/ML SOSY injection Inject 60 mg into the vein every 6 (six) months. 07/18/17   [provider]  Rivaroxaban (XARELTO) 15 MG TABS tablet Take 1 tablet (15 mg total) by mouth daily with supper. 05/23/19   Adrian Prows, MD  traMADol-acetaminophen (ULTRACET) 37.5-325 MG tablet Take 1 tablet by mouth every 6 (six) hours as needed (pain).    [provider]  triamterene-hydrochlorothiazide (MAXZIDE-25) 37.5-25 MG tablet Take 1 tablet by mouth daily as needed (for fluid retention/swelling.).  07/29/16   [provider]    Allergies    Morphine and related, Multaq [dronedarone], Demerol, Oysters [shellfish allergy], Penicillins, and Sulfa drugs cross reactors  Review of Systems   Review of Systems  Constitutional:       Per HPI, otherwise negative  HENT:       Per HPI, otherwise negative  Respiratory:       Per HPI, otherwise negative  Cardiovascular:       Per HPI, otherwise negative  Gastrointestinal: Negative for vomiting.  Endocrine:       Negative aside from HPI  Genitourinary:        Neg aside from HPI   Musculoskeletal:       Per HPI, otherwise negative  Skin: Negative.   Neurological: Negative for syncope.    Physical Exam Updated Vital Signs BP (!) 143/87   Pulse 73   Temp 97.7 F (36.5 C)   Resp 18   Ht 5\' 7"  (1.702 m)   Wt 67.1 kg   SpO2 96%   BMI 23.18 kg/m   Physical Exam Vitals and nursing note reviewed.  Constitutional:      General: She is not in acute distress.    Appearance: She is well-developed.  HENT:     Head: Normocephalic and atraumatic.  Eyes:     Conjunctiva/sclera: Conjunctivae normal.  Cardiovascular:     Rate and Rhythm: Normal rate and regular rhythm.  Pulmonary:     Effort: Pulmonary effort is normal. No respiratory distress.     Breath sounds: Normal breath sounds. No stridor.  Abdominal:     General: There is no distension.  Skin:    General: Skin is warm and dry.  Neurological:     Mental Status: She is alert and oriented to person, place, and time.     Cranial Nerves: No cranial nerve deficit.     ED Results / Procedures / Treatments   Labs (all labs ordered are listed, but only abnormal results are displayed) Labs Reviewed  COMPREHENSIVE METABOLIC PANEL - Abnormal; Notable for the following components:      Result Value   Sodium 134 (*)    Glucose, Bld 100 (*)    All other components within normal limits  CBC WITH DIFFERENTIAL/PLATELET - Abnormal; Notable for the following components:   RBC 3.52 (*)    Hemoglobin 11.8 (*)  HCT 35.4 (*)    MCV 100.6 (*)    All other components within normal limits  BRAIN NATRIURETIC PEPTIDE - Abnormal; Notable for the following components:   B Natriuretic Peptide 849.7 (*)    All other components within normal limits  URINALYSIS, ROUTINE W REFLEX MICROSCOPIC - Abnormal; Notable for the following components:   Nitrite POSITIVE (*)    Leukocytes,Ua TRACE (*)    Bacteria, UA RARE (*)    All other components within normal limits    EKG EKG  Interpretation  Date/Time:  Tuesday June 26 2019 22:25:43 EDT Ventricular Rate:  72 PR Interval:    QRS Duration: 190 QT Interval:  462 QTC Calculation: 495 R Axis:   -75 Text Interpretation: AV SEQUENTIAL PACEMAKER Premature atrial complexes Abnormal ECG Confirmed by Carmin Muskrat [phone]) on 06/26/2019 10:43:09 PM   Radiology DG Chest Port 1 View  Result Date: 06/26/2019 CLINICAL DATA:  Shortness of breath. Recent pacemaker placement. Weakness. EXAM: PORTABLE CHEST 1 VIEW COMPARISON:  Radiograph yesterday 06/25/2019 FINDINGS: Dual lead left-sided pacemaker in place with leads projecting over the right atrium and ventricle. Hazy opacity at both lung bases likely combination of pleural effusions and atelectasis and or airspace disease. Findings are not significantly changed from yesterday. Peribronchial thickening. Cardiomegaly with aortic atherosclerosis. No pneumothorax. IMPRESSION: 1. Cardiomegaly with vascular congestion. 2. Hazy opacity at both lung bases likely combination of pleural effusions and atelectasis and or airspace disease, not significantly changed from radiographs yesterday. Electronically Signed   By: Keith Rake M.D.   On: 06/26/2019 22:19    Procedures Procedures (including critical care time)  Medications Ordered in ED Medications - No data to display  ED Course  I have reviewed the triage vital signs and the nursing notes.  Pertinent labs & imaging results that were available during my care of the patient were reviewed by me and considered in my medical decision making (see chart for details).   Update: Patient accompanied by her daughter.  We discussed today's presentation, and findings thus far.  Findings generally reassuring, with unremarkable creatinine, no leukocytosis, and the patient has no evidence for pneumonia.  Does have similar abnormalities demonstrated back for the past 2 months on x-rays, we discussed this at length.  We discussed the patient's  urinalysis which is abnormal as well, and she is noted to have recurrent UTI, with last culture positive for Klebsiella, she is amenable to initiation of treatment pending these culture results from today's studies.   11:59 PM Patient's final labs notable for elevated BNP.  On reviewing the patient's chart, echocardiogram performed earlier this year notable for diminished EF, results below: Echocardiogram 04/05/2019:  Left ventricle cavity is normal in size. Moderate concentric hypertrophy  of the left ventricle. Normal global wall motion. Normal LV systolic  function with EF 56%. Unable to evaluate diastolic function due to atrial  fibrillation.  Left atrial cavity is moderately dilated.  Trileaflet aortic valve. Moderate (Grade II) aortic regurgitation.  Moderate (Grade II) mitral regurgitation.  Moderate tricuspid regurgitation. Estimated pulmonary artery systolic  pressure is 30 mmHg, increased from 22 mmHg on previous study on  11/09/2017.   Reviewing the patient's chart does not demonstrate ongoing Lasix use.  With no new oxygen requirement, no renal dysfunction, patient is appropriate to initiate Lasix for the coming days pending outpatient follow-up with her primary care physician, possibly with pulmonology.   Elderly female presents with ongoing dyspnea, no new oxygen requirement.  Here she is awake, alert, afebrile.  She does have abnormal x-ray, elevated BNP, and prior echocardiogram that is abnormal. Absent new oxygen requirement, patient is amenable to, appropriate for outpatient therapy.  She will start a course of Lasix.  In addition, patient found to have evidence for urinary tract infection, requiring initiation of antibiotics, culture sent.  Cipro started based on prior susceptibilities. Final Clinical Impression(s) / ED Diagnoses Final diagnoses:  SOB (shortness of breath)  Lower urinary tract infectious disease    Rx / DC Orders ED Discharge Orders         Ordered     ciprofloxacin (CIPRO) 500 MG tablet  Daily with breakfast,   Status:  Discontinued     06/27/19 0004    furosemide (LASIX) 20 MG tablet  Daily     06/27/19 0004    ciprofloxacin (CIPRO) 500 MG tablet  2 times daily     06/27/19 0004           Carmin Muskrat, MD 06/27/19 0004

## 2019-06-27 ENCOUNTER — Ambulatory Visit (INDEPENDENT_AMBULATORY_CARE_PROVIDER_SITE_OTHER): Payer: Medicare Other | Admitting: Pulmonary Disease

## 2019-06-27 ENCOUNTER — Encounter: Payer: Self-pay | Admitting: Pulmonary Disease

## 2019-06-27 DIAGNOSIS — I5031 Acute diastolic (congestive) heart failure: Secondary | ICD-10-CM | POA: Insufficient documentation

## 2019-06-27 DIAGNOSIS — R0602 Shortness of breath: Secondary | ICD-10-CM | POA: Diagnosis not present

## 2019-06-27 MED ORDER — CIPROFLOXACIN HCL 500 MG PO TABS
500.0000 mg | ORAL_TABLET | Freq: Once | ORAL | Status: AC
  Administered 2019-06-27: 500 mg via ORAL
  Filled 2019-06-27: qty 1

## 2019-06-27 MED ORDER — FUROSEMIDE 40 MG PO TABS
20.0000 mg | ORAL_TABLET | Freq: Once | ORAL | Status: AC
  Administered 2019-06-27: 20 mg via ORAL
  Filled 2019-06-27: qty 1

## 2019-06-27 MED ORDER — CIPROFLOXACIN HCL 500 MG PO TABS
500.0000 mg | ORAL_TABLET | Freq: Every day | ORAL | 0 refills | Status: DC
Start: 2019-06-27 — End: 2019-06-27

## 2019-06-27 MED ORDER — CIPROFLOXACIN HCL 500 MG PO TABS
500.0000 mg | ORAL_TABLET | Freq: Two times a day (BID) | ORAL | 0 refills | Status: DC
Start: 2019-06-27 — End: 2019-06-29

## 2019-06-27 MED ORDER — FUROSEMIDE 20 MG PO TABS
20.0000 mg | ORAL_TABLET | Freq: Every day | ORAL | 0 refills | Status: DC
Start: 2019-06-27 — End: 2019-06-29

## 2019-06-27 NOTE — Patient Instructions (Signed)
Take lasix 20 mg daily - we will provide Rx for another 2 weeks supply I spoke to dr Einar Gip & make FU appt in 1-2 weeks with his office

## 2019-06-27 NOTE — Assessment & Plan Note (Addendum)
Presentation is consistent with acute diastolic heart failure with bilateral effusions and pedal edema and orthopnea.  No reason to consider acute ischemia, EKG shows paced rhythm, troponins were not obtained during ED visit. She does have moderate MR and AR and this may be contributing or this may simply be related to stopping her diuretic She has had a good response after 1 dose of 20 mg of Lasix I encouraged her to take this for 2 weeks.  I called and spoke to her primary cardiologist who will get her into the office in that time span.  We can repeat chest x-ray in 2 weeks to see if pleural effusions have resolved but I fully expect her to respond to this diuretic therapy.  She does not seem to require oxygen at present She knows to go to the emergency room should her symptoms get worse

## 2019-06-27 NOTE — Discharge Instructions (Signed)
As discussed, today's evaluation has been generally reassuring.  However, there is demonstration of congestion in your lungs, and this requires additional evaluation.  Please schedule follow-up appointment with your physician and with our pulmonology colleagues peer With this consideration you will start a short course of medication to decrease your fluid overload.  When you have your follow-up appointment, please discuss this with your physicians.  In addition, there is some evidence for urinary tract disease, and you will start antibiotics tonight.  The urine culture has been sent and if these results indicate that you should stop your antibiotics, you will be notified.  Return here for concerning changes in your condition.

## 2019-06-27 NOTE — Progress Notes (Signed)
Subjective:    Patient ID: Patty [name], female    DOB: [birth year], 79 y.o.   MRN: [id number]  HPI  79 year old diabetic with nonischemic cardiomyopathy referred for evaluation of worsening shortness of breath over 3 days.  She has cardiomyopathy with AR and MR and has been following with [facility] cardiology, Dr. Terri Skains.  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 She developed increased shortness of breath and had an ED visit on 4/27 which I have reviewed.  Labs showed mild hyponatremia, BNP was elevated at 850, no leukocytosis Chest x-ray showed cardiomegaly with bilateral effusions which were new compared to her previous x-ray from 3/23. She was discharged with a prescription of 20 mg Lasix for 7 days. Her PCP called and made an emergent appointment with [place] when she was worked into our clinic today.  She reports shortness of breath for 2 to 3 days, slightly improved with ProAir, lying flat and walking makes this worse.  She reports mild ankle edema towards the end of the day and is sleeping with 2 pillows  She has a prior medical history of breast cancer and non-Hodgkin's lymphoma and type 2 diabetes She is accompanied by her husband Marggie Botros who corroborates the history  Significant tests/ events reviewed  Echo [id number] normal LV systolic function, grade 2 AI, grade 2 MR, moderate TR   Past Medical History:  Diagnosis Date  . Acute on chronic combined systolic and diastolic CHF (congestive heart failure) ([place])   . Arthritis   . Asthma   . Atrial flutter (HCC)    PAROXYSMAL, EKG 10/06/2017, ATRIAL FLUTTER (ATYPICAL) WITH VARIABLE VENTRICULAR RESPONCE AND CONTROLLED  . Atrial flutter, paroxysmal ([place]) 06/02/2018  . Breast cancer, right ([place]) 2/17   R sided, s/p total mastectomy  . Bulging lumbar disc    3  . Chronic kidney disease    recurring UTI  . Complication of anesthesia    "morphine made me stop breathing"  . Concentric left ventricular hypertrophy    MILD DECREASE IN GLOBAL WALL MOTION. DOPPLER EVIDENCE OF GRADE II (PSEUDONORMAL) DIASTOLIC DYSFUNCTION, ELEVATED LAP, CALCULATED EF 45%, LEFT ATRIAL CAVITY IS MILDLY DILATED. MILD TO MODERATE AORTIC REGURGITATION. MILD (GRADE I) MITRAL REGURGITATION. MILD TRICUSPID REGURGITATION. ESTIMATED PULMONARY ARTERY SYSTOLIC PRESSURE 22 MMHG.  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 Encounter for care of pacemaker 05/21/2019  . First degree AV block   . HCAP (healthcare-associated pneumonia) 12/2013   Archie Endo 01/18/2014  . History of cardioversion 06/02/2018  . History of skin cancer   . History of transfusion   . Hypertension   . Hypothyroidism   . Left atrial enlargement   . Leg swelling   . Lymphoma, non-Hodgkin's ([place])    in remission  . Malaise and fatigue   . Nocturia   . Nonischemic cardiomyopathy ([place]) 06/02/2018  . Pacemaker [place] MRI dual-chamber pacemaker 05/21/2019 05/21/2019  . Paroxysmal atrial fibrillation (HCC)   . Persistent atrial fibrillation ([place])   . Pure hypercholesterolemia   . Shortness of breath dyspnea    "at times"  . Skin cancer   . Sleep apnea    uses c-pap  . Type II diabetes mellitus ([place])     Past Surgical History:  Procedure Laterality Date  . ABDOMINAL HYSTERECTOMY  1972   partial  . ABDOMINAL HYSTERECTOMY    .  APPENDECTOMY  1953  . ATRIAL FIBRILLATION ABLATION  08/19/2015   PVI with CTI ablation by Dr Rayann Heman  . ATRIAL FIBRILLATION ABLATION N/A 04/14/2018   Procedure: ATRIAL FIBRILLATION ABLATION;  Surgeon: Thompson Grayer, MD;  Location: [facility];  Service: Cardiovascular;  Laterality: N/A;  . BACK SURGERY     x2  . BIOPSY  02/13/2018   Procedure: BIOPSY;  Surgeon: Ronnette Juniper, MD;  Location: [facility];  Service: Gastroenterology;;  X2=EGD & Colon  . BREAST SURGERY     Right mastectomy  .  CARDIAC CATHETERIZATION  10/10/2008   Nonobstructive CAD  . CARDIOVERSION  04/13/2011   Procedure: CARDIOVERSION;  Surgeon: Laverda Page, MD;  Location: [facility];  Service: Cardiovascular;  Laterality: N/A;  . CARDIOVERSION N/A 06/18/2015   Procedure: CARDIOVERSION;  Surgeon: Adrian Prows, MD;  Location: [facility];  Service: Cardiovascular;  Laterality: N/A;  . CARDIOVERSION N/A 10/26/2016   Procedure: CARDIOVERSION;  Surgeon: Adrian Prows, MD;  Location: [facility];  Service: Cardiovascular;  Laterality: N/A;  . CARDIOVERSION N/A 10/11/2017   Procedure: CARDIOVERSION;  Surgeon: Nigel Mormon, MD;  Location: [facility];  Service: Cardiovascular;  Laterality: N/A;  . CARDIOVERSION N/A 05/23/2018   Procedure: CARDIOVERSION;  Surgeon: Adrian Prows, MD;  Location: [facility];  Service: Cardiovascular;  Laterality: N/A;  . CATARACT EXTRACTION W/ INTRAOCULAR LENS  IMPLANT, BILATERAL Bilateral   . CHOLECYSTECTOMY  1993  . COLONOSCOPY N/A 02/13/2018   Procedure: COLONOSCOPY;  Surgeon: Ronnette Juniper, MD;  Location: [facility];  Service: Gastroenterology;  Laterality: N/A;  . ELECTROPHYSIOLOGIC STUDY N/A 08/19/2015   Procedure: Atrial Fibrillation Ablation;  Surgeon: Thompson Grayer, MD;  Location: [facility];  Service: Cardiovascular;  Laterality: N/A;  . ESOPHAGOGASTRODUODENOSCOPY N/A 02/13/2018   Procedure: ESOPHAGOGASTRODUODENOSCOPY (EGD);  Surgeon: Ronnette Juniper, MD;  Location: [facility];  Service: Gastroenterology;  Laterality: N/A;  . FOOT SURGERY    . INSERT / REPLACE / REMOVE PACEMAKER     [employer] MRI dual-chamber pacemaker  . IR REMOVAL TUN ACCESS W/ PORT W/O FL MOD SED  04/01/2017  . LUMBAR LAMINECTOMY    . MASS BIOPSY Left 12/07/2013   Procedure: EXCISIONAL BIOPSY LEFT NECK MASS ;  Surgeon: Jerrell Belfast, MD;  Location: [facility];  Service: ENT;  Laterality: Left;  Marland Kitchen MASTECTOMY Right 2017   malignant  . PACEMAKER IMPLANT N/A 05/21/2019   Procedure: PACEMAKER  IMPLANT;  Surgeon: Constance Haw, MD;  Location: [facility];  Service: Cardiovascular;  Laterality: N/A;  . POLYPECTOMY  02/13/2018   Procedure: POLYPECTOMY;  Surgeon: Ronnette Juniper, MD;  Location: [facility];  Service: Gastroenterology;;  EGD  . PORTACATH PLACEMENT  12/2013   Archie Endo 01/04/2014  . PUBOVAGINAL SLING    . SKIN TAG REMOVAL  2012   leg  . TEE WITHOUT CARDIOVERSION N/A 08/19/2015   Procedure: TRANSESOPHAGEAL ECHOCARDIOGRAM (TEE);  Surgeon: Larey Dresser, MD;  Location: [facility];  Service: Cardiovascular;  Laterality: N/A;  . TONSILLECTOMY    . TOTAL MASTECTOMY Right 05/27/2015   Procedure: RIGHT TOTAL MASTECTOMY;  Surgeon: Fanny Skates, MD;  Location: [facility];  Service: General;  Laterality: Right;    Allergies  Allergen Reactions  . Morphine And Related Anaphylaxis and Other (See Comments)    Pt states she stopped breathing post op- "went into resp. arrest"  . Multaq [Dronedarone] Other (See Comments)    Reaction:  Blood in urine and elevated liver enzymes.   . Demerol Nausea And  Vomiting  . Oysters [Shellfish Allergy] Rash    & CLAMS  . Penicillins Itching and Rash    Has patient had a PCN reaction causing immediate rash, facial/tongue/throat swelling, SOB or lightheadedness with hypotension: No Has patient had a PCN reaction causing severe rash involving mucus membranes or skin necrosis: Yes Has patient had a PCN reaction that required hospitalization No Has patient had a PCN reaction occurring within the last 10 years: No If all of the above answers are "NO", then may proceed with Cephalosporin use.   [facility] Cross Reactors Rash    Social History   Socioeconomic History  . Marital status: Married    Spouse name: Not on file  . Number of children: 2  . Years of education: Not on file  . Highest education level: Not on file  Occupational History  . Not on file  Tobacco Use  . Smoking status: Never Smoker  . Smokeless tobacco: Never  Used  Substance and Sexual Activity  . Alcohol use: No    Alcohol/week: 0.0 standard drinks  . Drug use: No  . Sexual activity: Not Currently    Birth control/protection: Surgical  Other Topics Concern  . Not on file  Social History Narrative   Pt lives in [place] with spouse.   Retired Therapist, sports.  Previously worked in [employer].   Social Determinants of Health   Financial Resource Strain:   . Difficulty of Paying Living Expenses:   Food Insecurity:   . Worried About Charity fundraiser in the Last Year:   . Arboriculturist in the Last Year:   Transportation Needs:   . Film/video editor (Medical):   Marland Kitchen Lack of Transportation (Non-Medical):   Physical Activity:   . Days of Exercise per Week:   . Minutes of Exercise per Session:   Stress:   . Feeling of Stress :   Social Connections:   . Frequency of Communication with Friends and Family:   . Frequency of Social Gatherings with Friends and Family:   . Attends Religious Services:   . Active Member of Clubs or Organizations:   . Attends Archivist Meetings:   Marland Kitchen Marital Status:   Intimate Partner Violence:   . Fear of Current or Ex-Partner:   . Emotionally Abused:   Marland Kitchen Physically Abused:   . Sexually Abused:     Family History  Problem Relation Age of Onset  . Stroke Father 98  . Heart attack Father 19  . Hypertension Father   . Heart disease Mother   . Arthritis Mother   . Breast cancer Mother 87  . Heart failure Mother   . Heart attack Brother   . Hypertension Brother   . Diabetes Sister   . Cancer Sister 42      Review of Systems Positive for shortness of breath, ankle edema and orthopnea Negative for chest pain and palpitations Negative for weight gain or weight loss, fevers chills Negative for anorexia, nausea vomiting diarrhea Negative for swelling, syncope    Objective:   Physical Exam  Gen. Pleasant, well-nourished, in no distress, normal affect ENT - no pallor,icterus, no post nasal  drip Neck: No JVD, no thyromegaly, no carotid bruits Lungs: no use of accessory muscles, no dullness to percussion, decreased RT  without rales or rhonchi  Cardiovascular: Rhythm regular, heart sounds  normal, no murmurs or gallops, !+ peripheral edema Abdomen: soft and non-tender, no hepatosplenomegaly, BS normal. Musculoskeletal: No deformities, no  cyanosis or clubbing Neuro:  alert, non focal        Assessment & Plan:

## 2019-06-28 DIAGNOSIS — E11610 Type 2 diabetes mellitus with diabetic neuropathic arthropathy: Secondary | ICD-10-CM | POA: Diagnosis not present

## 2019-06-28 DIAGNOSIS — I480 Paroxysmal atrial fibrillation: Secondary | ICD-10-CM | POA: Diagnosis not present

## 2019-06-28 DIAGNOSIS — R0600 Dyspnea, unspecified: Secondary | ICD-10-CM | POA: Diagnosis not present

## 2019-06-29 ENCOUNTER — Ambulatory Visit: Payer: Medicare Other | Admitting: Cardiology

## 2019-06-29 ENCOUNTER — Encounter: Payer: Self-pay | Admitting: Cardiology

## 2019-06-29 ENCOUNTER — Other Ambulatory Visit: Payer: Self-pay

## 2019-06-29 VITALS — BP 121/61 | HR 74 | Temp 98.8°F | Resp 15 | Ht 66.0 in | Wt 143.0 lb

## 2019-06-29 DIAGNOSIS — R0609 Other forms of dyspnea: Secondary | ICD-10-CM | POA: Diagnosis not present

## 2019-06-29 DIAGNOSIS — I5033 Acute on chronic diastolic (congestive) heart failure: Secondary | ICD-10-CM

## 2019-06-29 DIAGNOSIS — Z950 Presence of cardiac pacemaker: Secondary | ICD-10-CM

## 2019-06-29 DIAGNOSIS — I4892 Unspecified atrial flutter: Secondary | ICD-10-CM | POA: Diagnosis not present

## 2019-06-29 MED ORDER — POTASSIUM CHLORIDE ER 10 MEQ PO TBCR: 10.0000 meq | EXTENDED_RELEASE_TABLET | Freq: Every day | ORAL | 2 refills | Status: AC

## 2019-06-29 MED ORDER — FUROSEMIDE 20 MG PO TABS: 20.0000 mg | ORAL_TABLET | Freq: Every day | ORAL | 2 refills | Status: AC

## 2019-06-29 NOTE — Progress Notes (Signed)
Primary Physician/Referring:  Deland Pretty, MD  Patient ID: Patty [name], female    DOB: 1940-12-22, 79 y.o.   MRN: [id number]  Chief Complaint  Patient presents with  . Shortness of Breath  . Hospitalization Follow-up   HPI:    Patty [name]  is a 79 y.o.   female with Paroxysmal symptomatic atrial flutter/atrial fibrillation, S/P  ablation on 08/19/2015 and again on 04/14/2018 by Dr. 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   Patient presented to the ED at [facility] on 06/26/19 for Shortness of Breath. Following day presented to pulmonologist with symptoms consistent with acute diastolic heart failure with bilateral effusions and pedal edema and orthopnea. She was evaluated by Dr. Elsworth Soho who felt her symptoms were related to CHF and started her on Lasix. This is a sick work in.  She is now feeling much better with 8 Lbs weight loss.    Past Medical History:  Diagnosis Date  . Acute on chronic combined systolic and diastolic CHF (congestive heart failure) ([place])   . Arthritis   . Asthma   . Atrial flutter (HCC)    PAROXYSMAL, EKG 10/06/2017, ATRIAL FLUTTER (ATYPICAL) WITH VARIABLE VENTRICULAR RESPONCE AND CONTROLLED  . Atrial flutter, paroxysmal ([place]) 06/02/2018  . Breast cancer, right ([place]) 2/17   R sided, s/p total mastectomy  . Bulging lumbar disc    3  . Chronic kidney disease    recurring UTI  . Complication of anesthesia    "morphine made me stop breathing"  . Concentric left ventricular hypertrophy    MILD DECREASE IN GLOBAL WALL MOTION. DOPPLER EVIDENCE OF GRADE II (PSEUDONORMAL) DIASTOLIC DYSFUNCTION, ELEVATED LAP, CALCULATED EF 45%, LEFT ATRIAL CAVITY IS  MILDLY DILATED. MILD TO MODERATE AORTIC REGURGITATION. MILD (GRADE I) MITRAL REGURGITATION. MILD TRICUSPID REGURGITATION. ESTIMATED PULMONARY ARTERY SYSTOLIC PRESSURE 22 MMHG.  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 Encounter for care of pacemaker 05/21/2019  . First degree AV block   . HCAP (healthcare-associated pneumonia) 12/2013   Archie Endo 01/18/2014  . History of cardioversion 06/02/2018  . History of skin cancer   . History of transfusion   . Hypertension   . Hypothyroidism   . Left atrial enlargement   . Leg swelling   . 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 Malaise and fatigue   . Nocturia   . Nonischemic cardiomyopathy ([place]) 06/02/2018  . Pacemaker [place] MRI dual-chamber pacemaker 05/21/2019 05/21/2019  . Paroxysmal atrial fibrillation (HCC)   . Persistent atrial fibrillation ([place])   . Pure hypercholesterolemia   . Shortness of breath dyspnea    "at times"  . Skin cancer   . Sleep apnea    uses c-pap  . Type II diabetes mellitus ([place])    Past Surgical History:  Procedure Laterality Date  . ABDOMINAL HYSTERECTOMY  1972   partial  . ABDOMINAL HYSTERECTOMY    . APPENDECTOMY  1953  . ATRIAL FIBRILLATION ABLATION  08/19/2015   PVI with CTI ablation by Dr Rayann Heman  . ATRIAL FIBRILLATION ABLATION N/A 04/14/2018   Procedure: ATRIAL FIBRILLATION ABLATION;  Surgeon: Thompson Grayer, MD;  Location: [place] [facility];  Service: Cardiovascular;  Laterality: N/A;  .  BACK SURGERY     x2  . BIOPSY  02/13/2018   Procedure: BIOPSY;  Surgeon: Ronnette Juniper, MD;  Location: [facility];  Service: Gastroenterology;;  X2=EGD & Colon  . BREAST SURGERY     Right mastectomy  . CARDIAC CATHETERIZATION  10/10/2008   Nonobstructive CAD  . CARDIOVERSION  04/13/2011   Procedure: CARDIOVERSION;  Surgeon: Laverda Page, MD;  Location: [place];  Service: Cardiovascular;  Laterality: N/A;  . CARDIOVERSION N/A 06/18/2015    Procedure: CARDIOVERSION;  Surgeon: Adrian Prows, MD;  Location: [facility];  Service: Cardiovascular;  Laterality: N/A;  . CARDIOVERSION N/A 10/26/2016   Procedure: CARDIOVERSION;  Surgeon: Adrian Prows, MD;  Location: [place];  Service: Cardiovascular;  Laterality: N/A;  . CARDIOVERSION N/A 10/11/2017   Procedure: CARDIOVERSION;  Surgeon: Nigel Mormon, MD;  Location: [facility] ENDOSCOPY;  Service: Cardiovascular;  Laterality: N/A;  . CARDIOVERSION N/A 05/23/2018   Procedure: CARDIOVERSION;  Surgeon: Adrian Prows, MD;  Location: [place];  Service: Cardiovascular;  Laterality: N/A;  . CATARACT EXTRACTION W/ INTRAOCULAR LENS  IMPLANT, BILATERAL Bilateral   . CHOLECYSTECTOMY  1993  . COLONOSCOPY N/A 02/13/2018   Procedure: COLONOSCOPY;  Surgeon: Ronnette Juniper, MD;  Location: [facility]OS[facility];  Service: Gastroenterology;  Laterality: N/A;  . ELECTROPHYSIOLOGIC STUDY N/A 08/19/2015   Procedure: Atrial Fibrillation Ablation;  Surgeon: Thompson Grayer, MD;  Location: [place] [facility];  Service: Cardiovascular;  Laterality: N/A;  . ESOPHAGOGASTRODUODENOSCOPY N/A 02/13/2018   Procedure: ESOPHAGOGASTRODUODENOSCOPY (EGD);  Surgeon: Ronnette Juniper, MD;  Location: [facility];  Service: Gastroenterology;  Laterality: N/A;  . FOOT SURGERY    . INSERT / REPLACE / REMOVE PACEMAKER     [employer] Jude [employer] MRI dual-chamber pacemaker  . IR REMOVAL TUN ACCESS W/ PORT W/O FL MOD SED  04/01/2017  . LUMBAR LAMINECTOMY    . MASS BIOPSY Left 12/07/2013   Procedure: EXCISIONAL BIOPSY LEFT NECK MASS ;  Surgeon: Jerrell Belfast, MD;  Location: [place];  Service: ENT;  Laterality: Left;  Marland Kitchen MASTECTOMY Right 2017   malignant  . PACEMAKER IMPLANT N/A 05/21/2019   Procedure: PACEMAKER IMPLANT;  Surgeon: Constance Haw, MD;  Location: [place] [facility];  Service: Cardiovascular;  Laterality: N/A;  . POLYPECTOMY  02/13/2018   Procedure: POLYPECTOMY;  Surgeon: Ronnette Juniper, MD;  Location: [facility];  Service:  Gastroenterology;;  EGD  . PORTACATH PLACEMENT  12/2013   Archie Endo 01/04/2014  . PUBOVAGINAL SLING    . SKIN TAG REMOVAL  2012   leg  . TEE WITHOUT CARDIOVERSION N/A 08/19/2015   Procedure: TRANSESOPHAGEAL ECHOCARDIOGRAM (TEE);  Surgeon: Larey [place]er, MD;  Location: [place];  Service: Cardiovascular;  Laterality: N/A;  . TONSILLECTOMY    . TOTAL MASTECTOMY Right 05/27/2015   Procedure: RIGHT TOTAL MASTECTOMY;  Surgeon: Fanny Skates, MD;  Location: [facility];  Service: General;  Laterality: Right;   Social History   Tobacco Use  . Smoking status: Never Smoker  . Smokeless tobacco: Never Used  Substance Use Topics  . Alcohol use: No    Alcohol/week: 0.0 standard drinks   Family History  Problem Relation Age of Onset  . Stroke Father 7  . Heart attack Father 65  . Hypertension Father   . Heart disease Mother   . Arthritis Mother   . Breast cancer Mother 34  . Heart failure Mother   . Heart attack Brother   . Hypertension Brother   . Diabetes Sister   . Cancer Sister 96  Marital Status: Married  ROS  Review of Systems  Constitution: Positive for malaise/fatigue and weight gain.  Cardiovascular: Positive for dyspnea on exertion and leg swelling. Negative for chest pain.  Gastrointestinal: Negative for melena.   Objective  Blood pressure 121/61, pulse 74, temperature 98.8 F (37.1 C), resp. rate 15, height 5\' 6"  (1.676 m), weight 143 lb (64.9 kg), SpO2 99 %.  Vitals with BMI 06/29/2019 06/27/2019 06/27/2019  Height 5\' 6"  5\' 6"  -  Weight 143 lbs 145 lbs -  BMI 123456 [id number] -  Systolic [id number]  Diastolic 61 78 83  Pulse 74 85 71     Physical Exam  Constitutional: No distress.  petite  Cardiovascular: Regular rhythm, normal heart sounds, intact distal pulses and normal pulses. Tachycardia present. Exam reveals no gallop.  No murmur heard. No JVD. 2 + ankle edema.   Pulmonary/Chest: Effort normal and breath sounds normal.  Abdominal: Soft. Bowel sounds are  normal.  Mildly distended   Laboratory examination:   Recent Labs    05/29/19 1653 06/08/19 1007 06/26/19 2149  NA 138 143 134*  K 4.2 4.6 4.4  CL 102 102 102  CO2 24 22 24   GLUCOSE 136* 100* 100*  BUN 18 11 17   CREATININE 0.98 0.85 0.68  CALCIUM 10.4* 10.2 9.4  GFRNONAA 54* 65 >60  GFRAA >60 75 >60   estimated creatinine clearance is 52.5 mL/min (by C-G formula based on SCr of 0.68 mg/dL).  CMP Latest Ref Rng & Units 06/26/2019 06/08/2019 05/29/2019  Glucose 70 - 99 mg/dL 100(H) 100(H) 136(H)  BUN 8 - 23 mg/dL 17 11 18   Creatinine 0.44 - 1.00 mg/dL 0.68 0.85 0.98  Sodium 135 - 145 mmol/L 134(L) 143 138  Potassium 3.5 - 5.1 mmol/L 4.4 4.6 4.2  Chloride 98 - 111 mmol/L 102 102 102  CO2 22 - 32 mmol/L 24 22 24   Calcium 8.9 - 10.3 mg/dL 9.4 10.2 10.4(H)  Total Protein 6.5 - 8.1 g/dL 6.5 - -  Total Bilirubin 0.3 - 1.2 mg/dL 0.9 - -  Alkaline Phos 38 - 126 U/L 89 - -  AST 15 - 41 U/L 22 - -  ALT 0 - 44 U/L 15 - -   CBC Latest Ref Rng & Units 06/26/2019 05/29/2019 05/21/2019  WBC 4.0 - 10.5 K/uL 8.5 9.4 8.8  Hemoglobin 12.0 - 15.0 g/dL 11.8(L) 11.2(L) 10.5(L)  Hematocrit 36.0 - 46.0 % 35.4(L) 34.4(L) 31.9(L)  Platelets 150 - 400 K/uL 211 239 178   Lipid Panel     Component Value Date/Time   CHOL 123 03/16/2019 0932   TRIG 103 03/16/2019 0932   HDL 37 (L) 03/16/2019 0932   CHOLHDL 2.9 05/05/2010 0200   VLDL 33 05/05/2010 0200   LDLCALC 67 03/16/2019 0932   HEMOGLOBIN A1C Lab Results  Component Value Date   HGBA1C 5.8 (H) 03/16/2019   MPG 123 05/23/2015   TSH Recent Labs    03/16/19 0932 03/23/19 1839  TSH 2.860 6.601*   BNP    Component Value Date/Time   BNP 849.7 (H) 06/26/2019 2149   Medications and allergies   Allergies  Allergen Reactions  . Morphine And Related Anaphylaxis and Other (See Comments)    Pt states she stopped breathing post op- "went into resp. arrest"  . Multaq [Dronedarone] Other (See Comments)    Reaction:  Blood in urine and  elevated liver enzymes.   . Demerol Nausea And Vomiting  . Oysters [Shellfish Allergy] Rash    &  CLAMS  . Penicillins Itching and Rash    Has patient had a PCN reaction causing immediate rash, facial/tongue/throat swelling, SOB or lightheadedness with hypotension: No Has patient had a PCN reaction causing severe rash involving mucus membranes or skin necrosis: Yes Has patient had a PCN reaction that required hospitalization No Has patient had a PCN reaction occurring within the last 10 years: No If all of the above answers are "NO", then may proceed with Cephalosporin use.   [facility] Cross Reactors Rash     Current Outpatient Medications  Medication Instructions  . albuterol (PROVENTIL HFA;VENTOLIN HFA) 108 (90 Base) MCG/ACT inhaler 1-2 puffs, Inhalation, Every 6 hours PRN  . Alpha-Lipoic Acid 300 mg, Oral, 2 times daily  . anastrozole (ARIMIDEX) 1 mg, Oral, Daily  . B-12 1,000 mcg, Oral, Daily with lunch  . flecainide (TAMBOCOR) 50 mg, Oral, Every 12 hours  . Fluticasone-Salmeterol (ADVAIR) 250-50 MCG/DOSE AEPB 1 puff, Inhalation, Every 12 hours  . furosemide (LASIX) 20 mg, Oral, Daily  . gabapentin (NEURONTIN) 600 mg, Oral, 2 times daily  . levothyroxine (SYNTHROID) 200 mcg, Oral, Daily before breakfast  . Magnesium Oxide 500 mg, Oral, Daily at bedtime  . metFORMIN (GLUCOPHAGE-XR) 500 mg, Oral, 2 times daily with meals  . metoprolol succinate (TOPROL-XL) 50 MG 24 hr tablet TAKE 1 TABLET (50 MG TOTAL) BY MOUTH DAILY. TAKE WITH OR IMMEDIATELY FOLLOWING A MEAL.  . montelukast (SINGULAIR) 10 mg, Oral, Daily  . omega-3 acid ethyl esters (LOVAZA) 1 g, Oral, 2 times daily  . potassium chloride (KLOR-CON) 10 MEQ tablet 10 mEq, Oral, Daily, With furosemide  . Probiotic Product (PROBIOTIC PO) 1 tablet, Oral, Daily  . Prolia 60 mg, Intravenous, Every 6 months  . rivaroxaban (XARELTO) 20 mg, Oral, Daily with supper  . traMADol-acetaminophen (ULTRACET) 37.5-325 MG tablet 1 tablet, Oral,  Every 6 hours PRN  . Vitamin D 2,000 Units, Oral, Daily with lunch    Radiology:  No results found.  Cardiac Studies:   Coronary angiogram 2010 (no stents-Dr. Peter Martinique): Mild coronary calcification, Normal EF. Admitted to [facility] in March 2012 for A. Fibrillation with RVR. Stress cardiolite 3/12 [facility]) no ischemia .    Echocardiogram 04/05/2019:  Left ventricle cavity is normal in size. Moderate concentric hypertrophy of the left ventricle. Normal global wall motion. Normal LV systolic function with EF 56%. Unable to evaluate diastolic function due to atrial fibrillation.  Left atrial cavity is moderately dilated.  Trileaflet aortic valve. Moderate (Grade II) aortic regurgitation.  Moderate (Grade II) mitral regurgitation.  Moderate tricuspid regurgitation. Estimated pulmonary artery systolic pressure is 30 mmHg, increased from 22 mmHg on previous study on 11/09/2017.   Unscheduled (Alert) 06/06/2019: There were 3523 atrial high rate episodes detected. Review of available episode details demonstrates AT/AF. The longest lasted 00:03:20:28 in duration. There was a 43.0 % cumulative atrial arrhythmia burden. There were 0 high ventricular rate episodes detected. Health trends do not demonstrate significant abnormality. Battery longevity is 5.1 years. RA pacing is 73.0 %, RV pacing is 85.0 %.  Scheduled In office pacemaker check 06/18/19: Single (S)/Dual (D)/BV: D. Presenting AVP @ 65/min. Pacemaker dependant:  No. Underlying SB with 1st degree 559mS. AP 72%, VP 83%. AMS Episodes 4256.  AT/AF burden 28% . Longest 22 hours on 06/06/2019. Latest <10 S 06/18/2019. HVR 0.  Longevity 6.5 Years. Magnet rate: >85%. Lead measurements: Stable.Marland Kitchen Histogram: Low (L)/normal (N)/high (H)  N. Patient activity Low.  Observations: Normal pacemaker function. Acute settings. Changes:  Turn RV auto capture, Increase base rate to 70/min,  Changed AV delay to dynamic.  EKG:  EKG 06/26/2019: AV  paced rhythm with rate of 72 bpm, no further analysis.  06/18/2019: Sinus rhythm with first-degree AV block at the rate of 60 bpm, ventricularly paced rhythm.  No further analysis.    06/07/2019: Atypical atrial flutter with 2: 1 conduction, ventricular rate 101 bpm, left axis deviation, left anterior fascicular block, cannot exclude inferior infarct old. RBBB . Diffuse nonspecific ST-T abnormality, cannot exclude inferior and lateral ischemia.    Assessment     ICD-10-CM   1. Acute on chronic diastolic heart failure (HCC)  I50.33 furosemide (LASIX) 20 MG tablet    Basic metabolic panel    Brain natriuretic peptide    potassium chloride (KLOR-CON) 10 MEQ tablet  2. Dyspnea on exertion  R06.00   3. Pacemaker [place] MRI dual-chamber pacemaker 05/21/2019  Z95.0   4. Atrial flutter, paroxysmal (HCC)  I48.92      (Unable to tolerate Multaq due to abnorma LFT. Flecainide caused hight degree AV Block. Consider Tikosyn if recurrent A. FIb)   Meds ordered this encounter  Medications  . furosemide (LASIX) 20 MG tablet    Sig: Take 1 tablet (20 mg total) by mouth daily.    Dispense:  30 tablet    Refill:  2  . potassium chloride (KLOR-CON) 10 MEQ tablet    Sig: Take 1 tablet (10 mEq total) by mouth daily. With furosemide    Dispense:  30 tablet    Refill:  2    Medications Discontinued During This Encounter  Medication Reason  . triamterene-hydrochlorothiazide (MAXZIDE-25) 37.5-25 MG tablet Ineffective  . ciprofloxacin (CIPRO) 500 MG tablet Completed Course  . furosemide (LASIX) 20 MG tablet Reorder  . Rivaroxaban (XARELTO) 15 MG TABS tablet Change in therapy     Recommendations:   Patty [name]  is a 79 y.o.  female with Paroxysmal symptomatic atrial flutter/atrial fibrillation, S/P  ablation on 08/19/2015 and again on 04/14/2018 by Dr. Thompson Grayer, breakthrough atrial fibrillation needing cardioversions, complete heart block SP Saint Jude dual-chamber pacemaker  implantation on 05/22/2019, h/o lymphoma treated in 2015 and is in remission, right breast cancer, status post lumpectomy in 2017 followed by chemotherapy therapy, controlled diabetes mellitus, hyperlipidemia, hypertension.   She is now in acute decompensated heart failure, seen in the emergency room on 06/26/2019.  I have continued furosemide that was started by Dr. Elsworth Soho, she was referred to pulmonary due to abnormal chest x-ray showing congestion.  Has lost about 70 pounds in weight, dyspnea has improved, she started to feel well.  I resumed furosemide but also added potassium supplement, discontinue Maxide.  Advised her once she gets back to her baseline weight, she could change furosemide to every other day.  She is presently maintaining sinus rhythm, she is on flecainide 50 mg p.o. twice daily.  Continue the same.  Continue 20 mg dose of Xarelto as her creatinine clearance is greater than 50 mL.  Pacemaker is functioning normally.  No recent alerts.  She has an appointment to see Dr. Thompson Grayer on 07/04/2019, encouraged her to keep this.  I will see her back in 4 weeks for follow-up, she will obtain BMP and BNP in 3 to 4 days from now.  Adrian Prows, MD, [facility] 06/29/2019, 3:27 PM [facility]. [place] Office: [phone]

## 2019-07-02 DIAGNOSIS — Z23 Encounter for immunization: Secondary | ICD-10-CM | POA: Diagnosis not present

## 2019-07-03 DIAGNOSIS — I10 Essential (primary) hypertension: Secondary | ICD-10-CM | POA: Diagnosis not present

## 2019-07-04 ENCOUNTER — Encounter: Payer: Self-pay | Admitting: Internal Medicine

## 2019-07-04 ENCOUNTER — Telehealth (INDEPENDENT_AMBULATORY_CARE_PROVIDER_SITE_OTHER): Payer: Medicare Other | Admitting: Internal Medicine

## 2019-07-04 VITALS — BP 108/64 | HR 72 | Ht 66.0 in | Wt 138.0 lb

## 2019-07-04 DIAGNOSIS — I480 Paroxysmal atrial fibrillation: Secondary | ICD-10-CM

## 2019-07-04 DIAGNOSIS — D6869 Other thrombophilia: Secondary | ICD-10-CM | POA: Diagnosis not present

## 2019-07-04 DIAGNOSIS — I495 Sick sinus syndrome: Secondary | ICD-10-CM

## 2019-07-04 NOTE — Progress Notes (Signed)
Electrophysiology TeleHealth Note   Due to national recommendations of social distancing due to COVID 19, an audio/video telehealth visit is felt to be most appropriate for this patient at this time.  See MyChart message from today for the patient's consent to telehealth for [facility].  Date:  07/04/2019   ID:  Patty [name], DOB 1940-06-16, MRN [id number]  Location: patient's home  Provider location:  [place]  Evaluation Performed: Follow-up visit  PCP:  Deland Pretty, MD   Electrophysiologist:  Dr Rayann Heman  Chief Complaint:  palpitations  History of Present Illness:    Patty [name] is a 79 y.o. female who presents via telehealth conferencing today.  Since last being seen in our clinic, the patient reports doing reasonably well.  She presented with symptomatic bradycardia and underwent PPM implant by Dr Curt Bears.  She has done better with this.  Unfortunately, she continues to have afib/ atypical atrial flutters (burden by PPM 25%).  She has occasional SOB but overall is feeling "better".  Today, she denies symptoms of chest pain,  lower extremity edema, dizziness, presyncope, or syncope.  The patient is otherwise without complaint today.   Past Medical History:  Diagnosis Date  . Acute on chronic combined systolic and diastolic CHF (congestive heart failure) ([place])   . Arthritis   . Asthma   . Atrial flutter (HCC)    PAROXYSMAL, EKG 10/06/2017, ATRIAL FLUTTER (ATYPICAL) WITH VARIABLE VENTRICULAR RESPONCE AND CONTROLLED  . Atrial flutter, paroxysmal ([place]) 06/02/2018  . Breast cancer, right ([place]) 2/17   R sided, s/p total mastectomy  . Bulging lumbar disc    3  . Chronic kidney disease    recurring UTI  . Complication of anesthesia    "morphine made me stop breathing"  . Concentric left ventricular hypertrophy    MILD DECREASE IN GLOBAL WALL MOTION. DOPPLER EVIDENCE OF GRADE II (PSEUDONORMAL) DIASTOLIC DYSFUNCTION, ELEVATED LAP, CALCULATED EF 45%, LEFT ATRIAL  CAVITY IS MILDLY DILATED. MILD TO MODERATE AORTIC REGURGITATION. MILD (GRADE I) MITRAL REGURGITATION. MILD TRICUSPID REGURGITATION. ESTIMATED PULMONARY ARTERY SYSTOLIC PRESSURE 22 MMHG.  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 Encounter for care of pacemaker 05/21/2019  . First degree AV block   . HCAP (healthcare-associated pneumonia) 12/2013   Archie Endo 01/18/2014  . History of cardioversion 06/02/2018  . History of skin cancer   . History of transfusion   . Hypertension   . Hypothyroidism   . Left atrial enlargement   . Leg swelling   . 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 Malaise and fatigue   . Nocturia   . Nonischemic cardiomyopathy ([place]) 06/02/2018  . Pacemaker [place] MRI dual-chamber pacemaker 05/21/2019 05/21/2019  . Paroxysmal atrial fibrillation (HCC)   . Persistent atrial fibrillation ([place])   . Pure hypercholesterolemia   . Shortness of breath dyspnea    "at times"  . Skin cancer   . Sleep apnea    uses c-pap  . Type II diabetes mellitus ([place])     Past Surgical History:  Procedure Laterality Date  . ABDOMINAL HYSTERECTOMY  1972   partial  . ABDOMINAL HYSTERECTOMY    . APPENDECTOMY  1953  . ATRIAL FIBRILLATION ABLATION  08/19/2015   PVI with CTI ablation by Dr Rayann Heman  . ATRIAL FIBRILLATION ABLATION N/A 04/14/2018   Procedure: ATRIAL FIBRILLATION ABLATION;  Surgeon: Thompson Grayer, MD;  Location: [place];  Service: Cardiovascular;  Laterality: N/A;  . BACK SURGERY     x2  . BIOPSY  02/13/2018   Procedure: BIOPSY;  Surgeon: Ronnette Juniper, MD;  Location: [facility];  Service: Gastroenterology;;  X2=EGD & Colon  . BREAST SURGERY     Right mastectomy  . CARDIAC CATHETERIZATION  10/10/2008   Nonobstructive CAD  . CARDIOVERSION  04/13/2011   Procedure: CARDIOVERSION;  Surgeon: Laverda Page, MD;  Location: [place];  Service: Cardiovascular;  Laterality: N/A;  . CARDIOVERSION N/A  06/18/2015   Procedure: CARDIOVERSION;  Surgeon: Adrian Prows, MD;  Location: [facility] ENDOSCOPY;  Service: Cardiovascular;  Laterality: N/A;  . CARDIOVERSION N/A 10/26/2016   Procedure: CARDIOVERSION;  Surgeon: Adrian Prows, MD;  Location: [place];  Service: Cardiovascular;  Laterality: N/A;  . CARDIOVERSION N/A 10/11/2017   Procedure: CARDIOVERSION;  Surgeon: Nigel Mormon, MD;  Location: [facility] ENDOSCOPY;  Service: Cardiovascular;  Laterality: N/A;  . CARDIOVERSION N/A 05/23/2018   Procedure: CARDIOVERSION;  Surgeon: Adrian Prows, MD;  Location: [place];  Service: Cardiovascular;  Laterality: N/A;  . CATARACT EXTRACTION W/ INTRAOCULAR LENS  IMPLANT, BILATERAL Bilateral   . CHOLECYSTECTOMY  1993  . COLONOSCOPY N/A 02/13/2018   Procedure: COLONOSCOPY;  Surgeon: Ronnette Juniper, MD;  Location: [facility] ENDOS[facility];  Service: Gastroenterology;  Laterality: N/A;  . ELECTROPHYSIOLOGIC STUDY N/A 08/19/2015   Procedure: Atrial Fibrillation Ablation;  Surgeon: Thompson Grayer, MD;  Location: [place] [facility];  Service: Cardiovascular;  Laterality: N/A;  . ESOPHAGOGASTRODUODENOSCOPY N/A 02/13/2018   Procedure: ESOPHAGOGASTRODUODENOSCOPY (EGD);  Surgeon: Ronnette Juniper, MD;  Location: [facility];  Service: Gastroenterology;  Laterality: N/A;  . FOOT SURGERY    . INSERT / REPLACE / REMOVE PACEMAKER     [employer] MRI dual-chamber pacemaker  . IR REMOVAL TUN ACCESS W/ PORT W/O FL MOD SED  04/01/2017  . LUMBAR LAMINECTOMY    . MASS BIOPSY Left 12/07/2013   Procedure: EXCISIONAL BIOPSY LEFT NECK MASS ;  Surgeon: Jerrell Belfast, MD;  Location: [place];  Service: ENT;  Laterality: Left;  Marland Kitchen MASTECTOMY Right 2017   malignant  . PACEMAKER IMPLANT N/A 05/21/2019   Procedure: PACEMAKER IMPLANT;  Surgeon: Constance Haw, MD;  Location: [facility] [facility];  Service: Cardiovascular;  Laterality: N/A;  . POLYPECTOMY  02/13/2018   Procedure: POLYPECTOMY;  Surgeon: Ronnette Juniper, MD;  Location: [facility];  Service:  Gastroenterology;;  EGD  . PORTACATH PLACEMENT  12/2013   Archie Endo 01/04/2014  . PUBOVAGINAL SLING    . SKIN TAG REMOVAL  2012   leg  . TEE WITHOUT CARDIOVERSION N/A 08/19/2015   Procedure: TRANSESOPHAGEAL ECHOCARDIOGRAM (TEE);  Surgeon: Larey Dresser, MD;  Location: [place];  Service: Cardiovascular;  Laterality: N/A;  . TONSILLECTOMY    . TOTAL MASTECTOMY Right 05/27/2015   Procedure: RIGHT TOTAL MASTECTOMY;  Surgeon: Fanny Skates, MD;  Location: [facility];  Service: General;  Laterality: Right;    Current Outpatient Medications  Medication Sig Dispense Refill  . albuterol (PROVENTIL HFA;VENTOLIN HFA) 108 (90 Base) MCG/ACT inhaler Inhale 1-2 puffs into the lungs every 6 (six) hours as needed for wheezing or shortness of breath.     . Alpha-Lipoic Acid 300 MG TABS Take 300 mg by mouth 2 (two) times daily.     Marland Kitchen anastrozole (ARIMIDEX) 1 MG tablet Take 1 tablet (1 mg total) by mouth daily. 90 tablet 3  . Cholecalciferol (VITAMIN D) 2000 UNITS tablet Take 2,000 Units by mouth daily with lunch.    Marland Kitchen  Cyanocobalamin (B-12) 1000 MCG TABS Take 1,000 mcg by mouth daily with lunch.     . flecainide (TAMBOCOR) 100 MG tablet Take 0.5 tablets (50 mg total) by mouth every 12 (twelve) hours. 60 tablet 2  . Fluticasone-Salmeterol (ADVAIR) 250-50 MCG/DOSE AEPB Inhale 1 puff into the lungs every 12 (twelve) hours.     . furosemide (LASIX) 20 MG tablet Take 1 tablet (20 mg total) by mouth daily. 30 tablet 2  . gabapentin (NEURONTIN) 600 MG tablet Take 600 mg by mouth 2 (two) times daily.    Marland Kitchen levothyroxine (SYNTHROID, LEVOTHROID) 200 MCG tablet Take 200 mcg by mouth daily before breakfast.    . Magnesium Oxide 500 MG TABS Take 500 mg by mouth at bedtime.     . metFORMIN (GLUCOPHAGE-XR) 500 MG 24 hr tablet Take 500 mg by mouth 2 (two) times daily with a meal.    . metoprolol succinate (TOPROL-XL) 50 MG 24 hr tablet TAKE 1 TABLET (50 MG TOTAL) BY MOUTH DAILY. TAKE WITH OR IMMEDIATELY FOLLOWING A MEAL. 90  tablet 0  . montelukast (SINGULAIR) 10 MG tablet Take 10 mg by mouth daily.    Marland Kitchen omega-3 acid ethyl esters (LOVAZA) 1 g capsule Take 1 g by mouth 2 (two) times daily.    . potassium chloride (KLOR-CON) 10 MEQ tablet Take 1 tablet (10 mEq total) by mouth daily. With furosemide 30 tablet 2  . Probiotic Product (PROBIOTIC PO) Take 1 tablet by mouth daily.    Marland Kitchen PROLIA 60 MG/ML SOSY injection Inject 60 mg into the vein every 6 (six) months.    . rivaroxaban (XARELTO) 20 MG TABS tablet Take 20 mg by mouth daily with supper.    . traMADol-acetaminophen (ULTRACET) 37.5-325 MG tablet Take 1 tablet by mouth every 6 (six) hours as needed (pain).     No current facility-administered medications for this visit.    Allergies:   Morphine and related, Multaq [dronedarone], Demerol, Oysters [shellfish allergy], Penicillins, and Sulfa drugs cross reactors   Social History:  The patient  reports that she has never smoked. She has never used smokeless tobacco. She reports that she does not drink alcohol or use drugs.   ROS:  Please see the history of present illness.   All other systems are personally reviewed and negative.    Exam:    Vital Signs:  BP 108/64   Pulse 72   Ht 5\' 6"  (1.676 m)   Wt 138 lb (62.6 kg)   BMI 22.27 kg/m   Well sounding and appearing, alert and conversant, regular work of breathing,  good skin color Eyes- anicteric, neuro- grossly intact, skin- no apparent rash or lesions or cyanosis, mouth- oral mucosa is pink  Labs/Other Tests and Data Reviewed:    Recent Labs: 03/23/2019: TSH 6.601 06/08/2019: Magnesium 1.5 06/26/2019: ALT 15; B Natriuretic Peptide 849.7; BUN 17; Creatinine, Ser 0.68; Hemoglobin 11.8; Platelets 211; Potassium 4.4; Sodium 134   Wt Readings from Last 3 Encounters:  07/04/19 138 lb (62.6 kg)  06/29/19 143 lb (64.9 kg)  06/27/19 145 lb (65.8 kg)     Last device remote is reviewed from [place] PDF which reveals normal device function, afib burden is 25%,  V  paces 100% with VIP on   ASSESSMENT & PLAN:    1.  Persistent afib/ atypical atrial flutter Now more paroxysmal.  Burden by PPM is 25% She is on flecainide.  I would not advise increasing this. Unfortunately, her PVs were quiescent at prior ablation.  I do not feel that additional ablation is likely to be beneficial. We could consider tikosyn, though with V pacing in her QT is a little long.  We could reassess off of flecainide.  She requires close follow-up on AADs to avoid toxicity. Amiodarone would also be a reasonable option if we decide to not use tikosyn. chads2vasc score is at least 4.  She is on xarelto. I have offered change in AAD therapy today.  At this time, she would prefer to not making any changes.  This is reasonable.  2. Tachy/brady Now s/p PPM Remote reviewed in [facility] shows normal device function Will reduce pacing outputs on return  3. Recent diastolic dysfunction Improved Sodium avoidance  Follow-up:  With Dr Einar Gip as scheduled I will see in 2 months  Patient Risk:  after full review of this patients clinical status, I feel that they are at moderate risk at this time.  Today, I have spent 20 minutes with the patient with telehealth technology discussing arrhythmia management .    Army Fossa, MD  07/04/2019 12:10 PM     [address] [address] [phone] (office) [phone] (fax)

## 2019-07-05 ENCOUNTER — Telehealth: Payer: Self-pay

## 2019-07-09 DIAGNOSIS — I5033 Acute on chronic diastolic (congestive) heart failure: Secondary | ICD-10-CM | POA: Diagnosis not present

## 2019-07-10 ENCOUNTER — Other Ambulatory Visit (HOSPITAL_COMMUNITY): Payer: Self-pay | Admitting: Internal Medicine

## 2019-07-10 ENCOUNTER — Other Ambulatory Visit: Payer: Self-pay | Admitting: Internal Medicine

## 2019-07-10 DIAGNOSIS — I10 Essential (primary) hypertension: Secondary | ICD-10-CM | POA: Diagnosis not present

## 2019-07-10 DIAGNOSIS — J90 Pleural effusion, not elsewhere classified: Secondary | ICD-10-CM | POA: Diagnosis not present

## 2019-07-10 DIAGNOSIS — R1084 Generalized abdominal pain: Secondary | ICD-10-CM

## 2019-07-10 DIAGNOSIS — E11610 Type 2 diabetes mellitus with diabetic neuropathic arthropathy: Secondary | ICD-10-CM | POA: Diagnosis not present

## 2019-07-10 DIAGNOSIS — K5900 Constipation, unspecified: Secondary | ICD-10-CM | POA: Diagnosis not present

## 2019-07-10 DIAGNOSIS — R0600 Dyspnea, unspecified: Secondary | ICD-10-CM | POA: Diagnosis not present

## 2019-07-10 DIAGNOSIS — I5032 Chronic diastolic (congestive) heart failure: Secondary | ICD-10-CM | POA: Diagnosis not present

## 2019-07-10 LAB — BASIC METABOLIC PANEL
BUN/Creatinine Ratio: 20 (ref 12–28)
BUN: 18 mg/dL (ref 8–27)
CO2: 21 mmol/L (ref 20–29)
Calcium: 9.7 mg/dL (ref 8.7–10.3)
Chloride: 103 mmol/L (ref 96–106)
Creatinine, Ser: 0.89 mg/dL (ref 0.57–1.00)
GFR calc Af Amer: 71 mL/min/{1.73_m2} (ref >59)
GFR calc non Af Amer: 61 mL/min/{1.73_m2} (ref >59)
Glucose: 121 mg/dL — ABNORMAL HIGH (ref 65–99)
Potassium: 4.8 mmol/L (ref 3.5–5.2)
Sodium: 143 mmol/L (ref 134–144)

## 2019-07-10 LAB — BRAIN NATRIURETIC PEPTIDE: BNP: 415.4 pg/mL — ABNORMAL HIGH (ref 0.0–100.0)

## 2019-07-12 DIAGNOSIS — I5043 Acute on chronic combined systolic (congestive) and diastolic (congestive) heart failure: Secondary | ICD-10-CM | POA: Diagnosis not present

## 2019-07-12 DIAGNOSIS — I10 Essential (primary) hypertension: Secondary | ICD-10-CM | POA: Diagnosis not present

## 2019-07-13 ENCOUNTER — Encounter (HOSPITAL_COMMUNITY): Payer: Self-pay

## 2019-07-13 ENCOUNTER — Ambulatory Visit (HOSPITAL_COMMUNITY): Admission: RE | Admit: 2019-07-13 | Payer: Medicare Other | Source: Ambulatory Visit

## 2019-07-13 DIAGNOSIS — I5033 Acute on chronic diastolic (congestive) heart failure: Secondary | ICD-10-CM | POA: Diagnosis present

## 2019-07-13 DIAGNOSIS — R4182 Altered mental status, unspecified: Secondary | ICD-10-CM | POA: Diagnosis not present

## 2019-07-13 DIAGNOSIS — R079 Chest pain, unspecified: Secondary | ICD-10-CM | POA: Diagnosis not present

## 2019-07-13 DIAGNOSIS — E1122 Type 2 diabetes mellitus with diabetic chronic kidney disease: Secondary | ICD-10-CM | POA: Diagnosis present

## 2019-07-13 DIAGNOSIS — J9601 Acute respiratory failure with hypoxia: Secondary | ICD-10-CM | POA: Diagnosis present

## 2019-07-13 DIAGNOSIS — E874 Mixed disorder of acid-base balance: Secondary | ICD-10-CM | POA: Diagnosis present

## 2019-07-13 DIAGNOSIS — Z8674 Personal history of sudden cardiac arrest: Secondary | ICD-10-CM | POA: Diagnosis not present

## 2019-07-13 DIAGNOSIS — E039 Hypothyroidism, unspecified: Secondary | ICD-10-CM | POA: Diagnosis present

## 2019-07-13 DIAGNOSIS — J90 Pleural effusion, not elsewhere classified: Secondary | ICD-10-CM | POA: Diagnosis not present

## 2019-07-13 DIAGNOSIS — I5030 Unspecified diastolic (congestive) heart failure: Secondary | ICD-10-CM | POA: Diagnosis not present

## 2019-07-13 DIAGNOSIS — R404 Transient alteration of awareness: Secondary | ICD-10-CM | POA: Diagnosis not present

## 2019-07-13 DIAGNOSIS — I495 Sick sinus syndrome: Secondary | ICD-10-CM | POA: Diagnosis present

## 2019-07-13 DIAGNOSIS — Z853 Personal history of malignant neoplasm of breast: Secondary | ICD-10-CM | POA: Diagnosis not present

## 2019-07-13 DIAGNOSIS — G253 Myoclonus: Secondary | ICD-10-CM | POA: Diagnosis not present

## 2019-07-13 DIAGNOSIS — I517 Cardiomegaly: Secondary | ICD-10-CM | POA: Diagnosis not present

## 2019-07-13 DIAGNOSIS — Z7951 Long term (current) use of inhaled steroids: Secondary | ICD-10-CM | POA: Diagnosis not present

## 2019-07-13 DIAGNOSIS — R140 Abdominal distension (gaseous): Secondary | ICD-10-CM | POA: Diagnosis not present

## 2019-07-13 DIAGNOSIS — R9401 Abnormal electroencephalogram [EEG]: Secondary | ICD-10-CM | POA: Diagnosis not present

## 2019-07-13 DIAGNOSIS — Z79811 Long term (current) use of aromatase inhibitors: Secondary | ICD-10-CM | POA: Diagnosis not present

## 2019-07-13 DIAGNOSIS — G931 Anoxic brain damage, not elsewhere classified: Secondary | ICD-10-CM | POA: Diagnosis present

## 2019-07-13 DIAGNOSIS — E875 Hyperkalemia: Secondary | ICD-10-CM | POA: Diagnosis not present

## 2019-07-13 DIAGNOSIS — Z66 Do not resuscitate: Secondary | ICD-10-CM | POA: Diagnosis present

## 2019-07-13 DIAGNOSIS — D6489 Other specified anemias: Secondary | ICD-10-CM | POA: Diagnosis present

## 2019-07-13 DIAGNOSIS — R188 Other ascites: Secondary | ICD-10-CM | POA: Diagnosis not present

## 2019-07-13 DIAGNOSIS — R0902 Hypoxemia: Secondary | ICD-10-CM | POA: Diagnosis not present

## 2019-07-13 DIAGNOSIS — G40901 Epilepsy, unspecified, not intractable, with status epilepticus: Secondary | ICD-10-CM | POA: Diagnosis present

## 2019-07-13 DIAGNOSIS — Z781 Physical restraint status: Secondary | ICD-10-CM | POA: Diagnosis not present

## 2019-07-13 DIAGNOSIS — I9589 Other hypotension: Secondary | ICD-10-CM | POA: Diagnosis present

## 2019-07-13 DIAGNOSIS — J449 Chronic obstructive pulmonary disease, unspecified: Secondary | ICD-10-CM | POA: Diagnosis present

## 2019-07-13 DIAGNOSIS — Z7984 Long term (current) use of oral hypoglycemic drugs: Secondary | ICD-10-CM | POA: Diagnosis not present

## 2019-07-13 DIAGNOSIS — I499 Cardiac arrhythmia, unspecified: Secondary | ICD-10-CM | POA: Diagnosis not present

## 2019-07-13 DIAGNOSIS — G259 Extrapyramidal and movement disorder, unspecified: Secondary | ICD-10-CM | POA: Diagnosis not present

## 2019-07-13 DIAGNOSIS — Z515 Encounter for palliative care: Secondary | ICD-10-CM | POA: Diagnosis not present

## 2019-07-13 DIAGNOSIS — I4891 Unspecified atrial fibrillation: Secondary | ICD-10-CM | POA: Diagnosis not present

## 2019-07-13 DIAGNOSIS — I469 Cardiac arrest, cause unspecified: Secondary | ICD-10-CM | POA: Diagnosis not present

## 2019-07-13 DIAGNOSIS — I083 Combined rheumatic disorders of mitral, aortic and tricuspid valves: Secondary | ICD-10-CM | POA: Diagnosis present

## 2019-07-13 DIAGNOSIS — R4020 Unspecified coma: Secondary | ICD-10-CM | POA: Diagnosis not present

## 2019-07-13 DIAGNOSIS — I480 Paroxysmal atrial fibrillation: Secondary | ICD-10-CM | POA: Diagnosis not present

## 2019-07-13 DIAGNOSIS — Z950 Presence of cardiac pacemaker: Secondary | ICD-10-CM | POA: Diagnosis not present

## 2019-07-13 DIAGNOSIS — R000 Tachycardia, unspecified: Secondary | ICD-10-CM | POA: Diagnosis not present

## 2019-07-13 DIAGNOSIS — I959 Hypotension, unspecified: Secondary | ICD-10-CM | POA: Diagnosis not present

## 2019-07-13 DIAGNOSIS — E8770 Fluid overload, unspecified: Secondary | ICD-10-CM | POA: Diagnosis not present

## 2019-07-13 DIAGNOSIS — D72829 Elevated white blood cell count, unspecified: Secondary | ICD-10-CM | POA: Diagnosis not present

## 2019-07-13 DIAGNOSIS — Z20822 Contact with and (suspected) exposure to covid-19: Secondary | ICD-10-CM | POA: Diagnosis not present

## 2019-07-13 DIAGNOSIS — Z7901 Long term (current) use of anticoagulants: Secondary | ICD-10-CM | POA: Diagnosis not present

## 2019-07-13 DIAGNOSIS — N183 Chronic kidney disease, stage 3 unspecified: Secondary | ICD-10-CM | POA: Diagnosis present

## 2019-07-13 DIAGNOSIS — E1165 Type 2 diabetes mellitus with hyperglycemia: Secondary | ICD-10-CM | POA: Diagnosis not present

## 2019-07-13 MED ORDER — NOREPINEPHRINE-SODIUM CHLORIDE 16-0.9 MG/250ML-% IV SOLN
0.00 | INTRAVENOUS | Status: DC
Start: ? — End: 2019-07-13

## 2019-07-17 ENCOUNTER — Ambulatory Visit: Payer: Medicare Other | Admitting: Podiatry

## 2019-07-17 ENCOUNTER — Telehealth: Payer: Self-pay | Admitting: Internal Medicine

## 2019-07-17 MED ORDER — VALPROIC ACID 250 MG/5ML PO SOLN
750.00 | ORAL | Status: DC
Start: 2019-07-15 — End: 2019-07-17

## 2019-07-17 MED ORDER — GLYCOPYRROLATE 0.4 MG/2ML IJ SOLN
0.20 | INTRAMUSCULAR | Status: DC
Start: ? — End: 2019-07-17

## 2019-07-17 MED ORDER — ONDANSETRON HCL 4 MG/2ML IJ SOLN
4.00 | INTRAMUSCULAR | Status: DC
Start: ? — End: 2019-07-17

## 2019-07-17 MED ORDER — FENTANYL CITRATE (PF) 50 MCG/ML IJ SOLN
50.00 | INTRAMUSCULAR | Status: DC
Start: ? — End: 2019-07-17

## 2019-07-17 MED ORDER — CARBOXYMETHYLCELLULOSE SODIUM 0.5 % OP SOLN
1.00 | OPHTHALMIC | Status: DC
Start: ? — End: 2019-07-17

## 2019-07-17 MED ORDER — GENERIC EXTERNAL MEDICATION
0.50 | Status: DC
Start: ? — End: 2019-07-17

## 2019-07-17 MED ORDER — FENTANYL CITRATE (PF) 50 MCG/ML IJ SOLN
12.50 | INTRAMUSCULAR | Status: DC
Start: ? — End: 2019-07-17

## 2019-07-17 MED ORDER — GABAPENTIN 300 MG PO CAPS
600.00 | ORAL_CAPSULE | ORAL | Status: DC
Start: 2019-07-15 — End: 2019-07-17

## 2019-07-17 MED ORDER — POLYETHYLENE GLYCOL 3350 17 GM/SCOOP PO POWD
17.00 | ORAL | Status: DC
Start: ? — End: 2019-07-17

## 2019-07-17 MED ORDER — GENERIC EXTERNAL MEDICATION
1000.00 | Status: DC
Start: 2019-07-15 — End: 2019-07-17

## 2019-07-17 MED ORDER — LORAZEPAM 2 MG/ML IJ SOLN
1.00 | INTRAMUSCULAR | Status: DC
Start: ? — End: 2019-07-17

## 2019-07-17 MED ORDER — ACETAMINOPHEN 325 MG PO TABS
650.00 | ORAL_TABLET | ORAL | Status: DC
Start: 2019-07-15 — End: 2019-07-17

## 2019-07-17 MED ORDER — BISACODYL 10 MG RE SUPP
10.00 | RECTAL | Status: DC
Start: ? — End: 2019-07-17

## 2019-07-17 NOTE — Telephone Encounter (Signed)
New Message    Pts daughter is calling to let Dr Rayann Heman know the pt had passed away on 05/30/2022 Jul 22, 2019. She is also wondering what they should do with her device     Please call

## 2019-07-17 NOTE — Telephone Encounter (Signed)
Called patients daughter Phylliss Bob), informed her she only needs to unplug the monitor and I will send info to [place] to return monitor box. Address verified.  Offered condolences. [place] emailed with information required for return kit.

## 2019-07-25 ENCOUNTER — Ambulatory Visit: Payer: Medicare Other | Admitting: Cardiology

## 2019-07-31 DEATH — deceased

## 2019-08-02 NOTE — Telephone Encounter (Signed)
error 

## 2019-08-08 ENCOUNTER — Ambulatory Visit: Payer: Medicare Other

## 2019-08-23 ENCOUNTER — Encounter: Payer: Medicare Other | Admitting: Cardiology

## 2019-08-28 DIAGNOSIS — Z950 Presence of cardiac pacemaker: Secondary | ICD-10-CM | POA: Diagnosis not present

## 2019-08-28 DIAGNOSIS — Z45018 Encounter for adjustment and management of other part of cardiac pacemaker: Secondary | ICD-10-CM | POA: Diagnosis not present

## 2019-08-28 DIAGNOSIS — I495 Sick sinus syndrome: Secondary | ICD-10-CM | POA: Diagnosis not present

## 2019-08-28 DIAGNOSIS — I5031 Acute diastolic (congestive) heart failure: Secondary | ICD-10-CM | POA: Diagnosis not present

## 2019-09-05 ENCOUNTER — Encounter: Payer: Medicare Other | Admitting: Internal Medicine

## 2019-10-23 ENCOUNTER — Other Ambulatory Visit: Payer: Self-pay | Admitting: Hematology and Oncology

## 2019-10-23 DIAGNOSIS — C8591 Non-Hodgkin lymphoma, unspecified, lymph nodes of head, face, and neck: Secondary | ICD-10-CM

## 2019-10-23 DIAGNOSIS — Z170 Estrogen receptor positive status [ER+]: Secondary | ICD-10-CM

## 2019-10-25 ENCOUNTER — Ambulatory Visit: Payer: Medicare Other | Admitting: Hematology and Oncology

## 2019-10-25 ENCOUNTER — Other Ambulatory Visit: Payer: Medicare Other

## 2019-11-12 ENCOUNTER — Ambulatory Visit: Payer: Medicare Other | Admitting: Internal Medicine
